# Patient Record
Sex: Male | Born: 1940 | Race: Black or African American | Hispanic: No | Marital: Married | Smoking: Never smoker
Health system: Southern US, Community
[De-identification: ages and names within clinical notes are randomized; demographics above are authoritative.]

## PROBLEM LIST (undated history)

## (undated) DIAGNOSIS — I5032 Chronic diastolic (congestive) heart failure: Secondary | ICD-10-CM

## (undated) DIAGNOSIS — N400 Enlarged prostate without lower urinary tract symptoms: Secondary | ICD-10-CM

## (undated) DIAGNOSIS — I10 Essential (primary) hypertension: Secondary | ICD-10-CM

## (undated) DIAGNOSIS — K648 Other hemorrhoids: Secondary | ICD-10-CM

## (undated) DIAGNOSIS — E119 Type 2 diabetes mellitus without complications: Secondary | ICD-10-CM

## (undated) DIAGNOSIS — E785 Hyperlipidemia, unspecified: Secondary | ICD-10-CM

## (undated) DIAGNOSIS — N182 Chronic kidney disease, stage 2 (mild): Secondary | ICD-10-CM

## (undated) DIAGNOSIS — K219 Gastro-esophageal reflux disease without esophagitis: Secondary | ICD-10-CM

## (undated) DIAGNOSIS — Z951 Presence of aortocoronary bypass graft: Secondary | ICD-10-CM

## (undated) DIAGNOSIS — G459 Transient cerebral ischemic attack, unspecified: Secondary | ICD-10-CM

## (undated) DIAGNOSIS — D126 Benign neoplasm of colon, unspecified: Secondary | ICD-10-CM

## (undated) DIAGNOSIS — G4730 Sleep apnea, unspecified: Secondary | ICD-10-CM

## (undated) DIAGNOSIS — G250 Essential tremor: Secondary | ICD-10-CM

## (undated) DIAGNOSIS — I2510 Atherosclerotic heart disease of native coronary artery without angina pectoris: Secondary | ICD-10-CM

## (undated) HISTORY — PX: LUMBAR DISC SURGERY: SHX700

## (undated) HISTORY — DX: Gastro-esophageal reflux disease without esophagitis: K21.9

## (undated) HISTORY — DX: Other hemorrhoids: K64.8

## (undated) HISTORY — PX: TOTAL KNEE ARTHROPLASTY: SHX125

## (undated) HISTORY — PX: CATARACT EXTRACTION, BILATERAL: SHX1313

## (undated) HISTORY — PX: CATARACT EXTRACTION: SUR2

## (undated) HISTORY — DX: Benign prostatic hyperplasia without lower urinary tract symptoms: N40.0

## (undated) HISTORY — PX: BACK SURGERY: SHX140

## (undated) HISTORY — PX: JOINT REPLACEMENT: SHX530

## (undated) HISTORY — DX: Benign neoplasm of colon, unspecified: D12.6

---

## 2000-10-31 DIAGNOSIS — G459 Transient cerebral ischemic attack, unspecified: Secondary | ICD-10-CM

## 2000-10-31 HISTORY — DX: Transient cerebral ischemic attack, unspecified: G45.9

## 2011-04-01 DIAGNOSIS — D126 Benign neoplasm of colon, unspecified: Secondary | ICD-10-CM

## 2011-04-01 HISTORY — DX: Benign neoplasm of colon, unspecified: D12.6

## 2011-12-02 DIAGNOSIS — Z8673 Personal history of transient ischemic attack (TIA), and cerebral infarction without residual deficits: Secondary | ICD-10-CM | POA: Diagnosis not present

## 2011-12-02 DIAGNOSIS — R0602 Shortness of breath: Secondary | ICD-10-CM | POA: Diagnosis not present

## 2011-12-02 DIAGNOSIS — Z794 Long term (current) use of insulin: Secondary | ICD-10-CM | POA: Diagnosis not present

## 2011-12-02 DIAGNOSIS — I252 Old myocardial infarction: Secondary | ICD-10-CM | POA: Diagnosis not present

## 2011-12-02 DIAGNOSIS — Z7982 Long term (current) use of aspirin: Secondary | ICD-10-CM | POA: Diagnosis not present

## 2011-12-02 DIAGNOSIS — J09X1 Influenza due to identified novel influenza A virus with pneumonia: Secondary | ICD-10-CM | POA: Diagnosis not present

## 2011-12-02 DIAGNOSIS — I499 Cardiac arrhythmia, unspecified: Secondary | ICD-10-CM | POA: Diagnosis not present

## 2011-12-02 DIAGNOSIS — I10 Essential (primary) hypertension: Secondary | ICD-10-CM | POA: Diagnosis not present

## 2011-12-02 DIAGNOSIS — E860 Dehydration: Secondary | ICD-10-CM | POA: Diagnosis not present

## 2011-12-02 DIAGNOSIS — J189 Pneumonia, unspecified organism: Secondary | ICD-10-CM | POA: Diagnosis not present

## 2011-12-02 DIAGNOSIS — K219 Gastro-esophageal reflux disease without esophagitis: Secondary | ICD-10-CM | POA: Diagnosis not present

## 2011-12-02 DIAGNOSIS — I2510 Atherosclerotic heart disease of native coronary artery without angina pectoris: Secondary | ICD-10-CM | POA: Diagnosis not present

## 2011-12-02 DIAGNOSIS — I2129 ST elevation (STEMI) myocardial infarction involving other sites: Secondary | ICD-10-CM | POA: Diagnosis not present

## 2011-12-02 DIAGNOSIS — M6282 Rhabdomyolysis: Secondary | ICD-10-CM | POA: Diagnosis not present

## 2011-12-02 DIAGNOSIS — J111 Influenza due to unidentified influenza virus with other respiratory manifestations: Secondary | ICD-10-CM | POA: Diagnosis not present

## 2011-12-02 DIAGNOSIS — R079 Chest pain, unspecified: Secondary | ICD-10-CM | POA: Diagnosis not present

## 2011-12-02 DIAGNOSIS — Z6841 Body Mass Index (BMI) 40.0 and over, adult: Secondary | ICD-10-CM | POA: Diagnosis not present

## 2011-12-02 DIAGNOSIS — E669 Obesity, unspecified: Secondary | ICD-10-CM | POA: Diagnosis not present

## 2011-12-02 DIAGNOSIS — Z96659 Presence of unspecified artificial knee joint: Secondary | ICD-10-CM | POA: Diagnosis not present

## 2011-12-13 DIAGNOSIS — J189 Pneumonia, unspecified organism: Secondary | ICD-10-CM | POA: Diagnosis not present

## 2011-12-13 DIAGNOSIS — E785 Hyperlipidemia, unspecified: Secondary | ICD-10-CM | POA: Diagnosis not present

## 2011-12-13 DIAGNOSIS — E669 Obesity, unspecified: Secondary | ICD-10-CM | POA: Diagnosis not present

## 2011-12-13 DIAGNOSIS — E119 Type 2 diabetes mellitus without complications: Secondary | ICD-10-CM | POA: Diagnosis not present

## 2011-12-19 DIAGNOSIS — H35359 Cystoid macular degeneration, unspecified eye: Secondary | ICD-10-CM | POA: Diagnosis not present

## 2011-12-19 DIAGNOSIS — H35039 Hypertensive retinopathy, unspecified eye: Secondary | ICD-10-CM | POA: Diagnosis not present

## 2011-12-19 DIAGNOSIS — E11319 Type 2 diabetes mellitus with unspecified diabetic retinopathy without macular edema: Secondary | ICD-10-CM | POA: Diagnosis not present

## 2011-12-19 DIAGNOSIS — H3560 Retinal hemorrhage, unspecified eye: Secondary | ICD-10-CM | POA: Diagnosis not present

## 2011-12-20 DIAGNOSIS — R4701 Aphasia: Secondary | ICD-10-CM | POA: Diagnosis not present

## 2011-12-20 DIAGNOSIS — G5600 Carpal tunnel syndrome, unspecified upper limb: Secondary | ICD-10-CM | POA: Diagnosis not present

## 2011-12-21 DIAGNOSIS — R319 Hematuria, unspecified: Secondary | ICD-10-CM | POA: Diagnosis not present

## 2011-12-21 DIAGNOSIS — R350 Frequency of micturition: Secondary | ICD-10-CM | POA: Diagnosis not present

## 2011-12-21 DIAGNOSIS — E119 Type 2 diabetes mellitus without complications: Secondary | ICD-10-CM | POA: Diagnosis not present

## 2011-12-21 DIAGNOSIS — N400 Enlarged prostate without lower urinary tract symptoms: Secondary | ICD-10-CM | POA: Diagnosis not present

## 2011-12-21 DIAGNOSIS — N390 Urinary tract infection, site not specified: Secondary | ICD-10-CM | POA: Diagnosis not present

## 2012-02-07 DIAGNOSIS — N400 Enlarged prostate without lower urinary tract symptoms: Secondary | ICD-10-CM | POA: Diagnosis not present

## 2012-02-09 DIAGNOSIS — E785 Hyperlipidemia, unspecified: Secondary | ICD-10-CM | POA: Diagnosis not present

## 2012-02-09 DIAGNOSIS — I10 Essential (primary) hypertension: Secondary | ICD-10-CM | POA: Diagnosis not present

## 2012-02-09 DIAGNOSIS — E119 Type 2 diabetes mellitus without complications: Secondary | ICD-10-CM | POA: Diagnosis not present

## 2012-02-10 DIAGNOSIS — N400 Enlarged prostate without lower urinary tract symptoms: Secondary | ICD-10-CM | POA: Diagnosis not present

## 2012-02-10 DIAGNOSIS — R0602 Shortness of breath: Secondary | ICD-10-CM | POA: Diagnosis not present

## 2012-02-10 DIAGNOSIS — R079 Chest pain, unspecified: Secondary | ICD-10-CM | POA: Diagnosis not present

## 2012-02-10 DIAGNOSIS — E119 Type 2 diabetes mellitus without complications: Secondary | ICD-10-CM | POA: Diagnosis not present

## 2012-02-10 DIAGNOSIS — I2510 Atherosclerotic heart disease of native coronary artery without angina pectoris: Secondary | ICD-10-CM | POA: Diagnosis not present

## 2012-02-10 DIAGNOSIS — E7800 Pure hypercholesterolemia, unspecified: Secondary | ICD-10-CM | POA: Diagnosis not present

## 2012-02-14 DIAGNOSIS — R51 Headache: Secondary | ICD-10-CM | POA: Diagnosis not present

## 2012-02-14 DIAGNOSIS — I6330 Cerebral infarction due to thrombosis of unspecified cerebral artery: Secondary | ICD-10-CM | POA: Diagnosis not present

## 2012-02-14 DIAGNOSIS — I63239 Cerebral infarction due to unspecified occlusion or stenosis of unspecified carotid arteries: Secondary | ICD-10-CM | POA: Diagnosis not present

## 2012-02-16 DIAGNOSIS — I6330 Cerebral infarction due to thrombosis of unspecified cerebral artery: Secondary | ICD-10-CM | POA: Diagnosis not present

## 2012-02-16 DIAGNOSIS — I63239 Cerebral infarction due to unspecified occlusion or stenosis of unspecified carotid arteries: Secondary | ICD-10-CM | POA: Diagnosis not present

## 2012-02-16 DIAGNOSIS — R51 Headache: Secondary | ICD-10-CM | POA: Diagnosis not present

## 2012-02-24 DIAGNOSIS — G459 Transient cerebral ischemic attack, unspecified: Secondary | ICD-10-CM | POA: Diagnosis not present

## 2012-02-24 DIAGNOSIS — K219 Gastro-esophageal reflux disease without esophagitis: Secondary | ICD-10-CM | POA: Diagnosis not present

## 2012-02-24 DIAGNOSIS — R350 Frequency of micturition: Secondary | ICD-10-CM | POA: Diagnosis not present

## 2012-02-24 DIAGNOSIS — I440 Atrioventricular block, first degree: Secondary | ICD-10-CM | POA: Diagnosis not present

## 2012-02-24 DIAGNOSIS — G4730 Sleep apnea, unspecified: Secondary | ICD-10-CM | POA: Diagnosis not present

## 2012-02-24 DIAGNOSIS — N411 Chronic prostatitis: Secondary | ICD-10-CM | POA: Diagnosis not present

## 2012-02-24 DIAGNOSIS — Z794 Long term (current) use of insulin: Secondary | ICD-10-CM | POA: Diagnosis not present

## 2012-02-24 DIAGNOSIS — R319 Hematuria, unspecified: Secondary | ICD-10-CM | POA: Diagnosis not present

## 2012-02-24 DIAGNOSIS — Z8673 Personal history of transient ischemic attack (TIA), and cerebral infarction without residual deficits: Secondary | ICD-10-CM | POA: Diagnosis not present

## 2012-02-24 DIAGNOSIS — I471 Supraventricular tachycardia: Secondary | ICD-10-CM | POA: Diagnosis not present

## 2012-02-24 DIAGNOSIS — N400 Enlarged prostate without lower urinary tract symptoms: Secondary | ICD-10-CM | POA: Diagnosis not present

## 2012-02-24 DIAGNOSIS — E119 Type 2 diabetes mellitus without complications: Secondary | ICD-10-CM | POA: Diagnosis not present

## 2012-02-24 DIAGNOSIS — Z6841 Body Mass Index (BMI) 40.0 and over, adult: Secondary | ICD-10-CM | POA: Diagnosis not present

## 2012-02-24 DIAGNOSIS — I2510 Atherosclerotic heart disease of native coronary artery without angina pectoris: Secondary | ICD-10-CM | POA: Diagnosis not present

## 2012-02-24 DIAGNOSIS — E7800 Pure hypercholesterolemia, unspecified: Secondary | ICD-10-CM | POA: Diagnosis not present

## 2012-02-24 DIAGNOSIS — I10 Essential (primary) hypertension: Secondary | ICD-10-CM | POA: Diagnosis not present

## 2012-02-24 DIAGNOSIS — I252 Old myocardial infarction: Secondary | ICD-10-CM | POA: Diagnosis not present

## 2012-02-24 DIAGNOSIS — Z96659 Presence of unspecified artificial knee joint: Secondary | ICD-10-CM | POA: Diagnosis not present

## 2012-02-24 DIAGNOSIS — R079 Chest pain, unspecified: Secondary | ICD-10-CM | POA: Diagnosis not present

## 2012-03-14 DIAGNOSIS — L738 Other specified follicular disorders: Secondary | ICD-10-CM | POA: Diagnosis not present

## 2012-03-14 DIAGNOSIS — E1159 Type 2 diabetes mellitus with other circulatory complications: Secondary | ICD-10-CM | POA: Diagnosis not present

## 2012-03-14 DIAGNOSIS — L84 Corns and callosities: Secondary | ICD-10-CM | POA: Diagnosis not present

## 2012-03-14 DIAGNOSIS — B351 Tinea unguium: Secondary | ICD-10-CM | POA: Diagnosis not present

## 2012-04-06 DIAGNOSIS — E785 Hyperlipidemia, unspecified: Secondary | ICD-10-CM | POA: Diagnosis not present

## 2012-05-08 DIAGNOSIS — Z6841 Body Mass Index (BMI) 40.0 and over, adult: Secondary | ICD-10-CM | POA: Diagnosis not present

## 2012-05-08 DIAGNOSIS — Z79899 Other long term (current) drug therapy: Secondary | ICD-10-CM | POA: Diagnosis not present

## 2012-05-08 DIAGNOSIS — Z7982 Long term (current) use of aspirin: Secondary | ICD-10-CM | POA: Diagnosis not present

## 2012-05-08 DIAGNOSIS — R079 Chest pain, unspecified: Secondary | ICD-10-CM | POA: Diagnosis not present

## 2012-05-08 DIAGNOSIS — I10 Essential (primary) hypertension: Secondary | ICD-10-CM | POA: Diagnosis not present

## 2012-05-08 DIAGNOSIS — E119 Type 2 diabetes mellitus without complications: Secondary | ICD-10-CM | POA: Diagnosis not present

## 2012-05-08 DIAGNOSIS — E7800 Pure hypercholesterolemia, unspecified: Secondary | ICD-10-CM | POA: Diagnosis not present

## 2012-05-08 DIAGNOSIS — E669 Obesity, unspecified: Secondary | ICD-10-CM | POA: Diagnosis not present

## 2012-05-08 DIAGNOSIS — R0602 Shortness of breath: Secondary | ICD-10-CM | POA: Diagnosis not present

## 2012-05-08 DIAGNOSIS — I252 Old myocardial infarction: Secondary | ICD-10-CM | POA: Diagnosis not present

## 2012-05-08 DIAGNOSIS — I2510 Atherosclerotic heart disease of native coronary artery without angina pectoris: Secondary | ICD-10-CM | POA: Diagnosis not present

## 2012-05-08 DIAGNOSIS — R0789 Other chest pain: Secondary | ICD-10-CM | POA: Diagnosis not present

## 2012-05-08 DIAGNOSIS — I440 Atrioventricular block, first degree: Secondary | ICD-10-CM | POA: Diagnosis not present

## 2012-05-08 DIAGNOSIS — Z794 Long term (current) use of insulin: Secondary | ICD-10-CM | POA: Diagnosis not present

## 2012-05-08 DIAGNOSIS — I259 Chronic ischemic heart disease, unspecified: Secondary | ICD-10-CM | POA: Diagnosis not present

## 2012-05-08 DIAGNOSIS — F411 Generalized anxiety disorder: Secondary | ICD-10-CM | POA: Diagnosis not present

## 2012-05-08 DIAGNOSIS — E789 Disorder of lipoprotein metabolism, unspecified: Secondary | ICD-10-CM | POA: Diagnosis not present

## 2012-05-09 DIAGNOSIS — E789 Disorder of lipoprotein metabolism, unspecified: Secondary | ICD-10-CM | POA: Diagnosis not present

## 2012-05-09 DIAGNOSIS — I10 Essential (primary) hypertension: Secondary | ICD-10-CM | POA: Diagnosis not present

## 2012-05-09 DIAGNOSIS — I259 Chronic ischemic heart disease, unspecified: Secondary | ICD-10-CM | POA: Diagnosis not present

## 2012-05-09 DIAGNOSIS — R079 Chest pain, unspecified: Secondary | ICD-10-CM | POA: Diagnosis not present

## 2012-05-10 DIAGNOSIS — R079 Chest pain, unspecified: Secondary | ICD-10-CM | POA: Diagnosis not present

## 2012-05-10 DIAGNOSIS — I2510 Atherosclerotic heart disease of native coronary artery without angina pectoris: Secondary | ICD-10-CM | POA: Diagnosis not present

## 2012-05-16 DIAGNOSIS — G4733 Obstructive sleep apnea (adult) (pediatric): Secondary | ICD-10-CM | POA: Diagnosis not present

## 2012-05-16 DIAGNOSIS — Z7982 Long term (current) use of aspirin: Secondary | ICD-10-CM | POA: Diagnosis not present

## 2012-05-16 DIAGNOSIS — G609 Hereditary and idiopathic neuropathy, unspecified: Secondary | ICD-10-CM | POA: Diagnosis not present

## 2012-05-16 DIAGNOSIS — Z8249 Family history of ischemic heart disease and other diseases of the circulatory system: Secondary | ICD-10-CM | POA: Diagnosis not present

## 2012-05-16 DIAGNOSIS — M1990 Unspecified osteoarthritis, unspecified site: Secondary | ICD-10-CM | POA: Diagnosis not present

## 2012-05-16 DIAGNOSIS — E785 Hyperlipidemia, unspecified: Secondary | ICD-10-CM | POA: Diagnosis not present

## 2012-05-16 DIAGNOSIS — I501 Left ventricular failure: Secondary | ICD-10-CM | POA: Diagnosis not present

## 2012-05-16 DIAGNOSIS — I10 Essential (primary) hypertension: Secondary | ICD-10-CM | POA: Diagnosis not present

## 2012-05-16 DIAGNOSIS — K219 Gastro-esophageal reflux disease without esophagitis: Secondary | ICD-10-CM | POA: Diagnosis not present

## 2012-05-16 DIAGNOSIS — N400 Enlarged prostate without lower urinary tract symptoms: Secondary | ICD-10-CM | POA: Diagnosis not present

## 2012-05-16 DIAGNOSIS — R0789 Other chest pain: Secondary | ICD-10-CM | POA: Diagnosis not present

## 2012-05-16 DIAGNOSIS — I2510 Atherosclerotic heart disease of native coronary artery without angina pectoris: Secondary | ICD-10-CM | POA: Diagnosis not present

## 2012-07-05 DIAGNOSIS — IMO0002 Reserved for concepts with insufficient information to code with codable children: Secondary | ICD-10-CM | POA: Diagnosis not present

## 2012-07-05 DIAGNOSIS — I10 Essential (primary) hypertension: Secondary | ICD-10-CM | POA: Diagnosis not present

## 2012-07-05 DIAGNOSIS — M549 Dorsalgia, unspecified: Secondary | ICD-10-CM | POA: Diagnosis not present

## 2012-07-05 DIAGNOSIS — R079 Chest pain, unspecified: Secondary | ICD-10-CM | POA: Diagnosis not present

## 2012-07-05 DIAGNOSIS — M25519 Pain in unspecified shoulder: Secondary | ICD-10-CM | POA: Diagnosis not present

## 2012-07-09 DIAGNOSIS — E11319 Type 2 diabetes mellitus with unspecified diabetic retinopathy without macular edema: Secondary | ICD-10-CM | POA: Diagnosis not present

## 2012-07-09 DIAGNOSIS — H02409 Unspecified ptosis of unspecified eyelid: Secondary | ICD-10-CM | POA: Diagnosis not present

## 2012-07-09 DIAGNOSIS — H40019 Open angle with borderline findings, low risk, unspecified eye: Secondary | ICD-10-CM | POA: Diagnosis not present

## 2012-07-09 DIAGNOSIS — H01009 Unspecified blepharitis unspecified eye, unspecified eyelid: Secondary | ICD-10-CM | POA: Diagnosis not present

## 2012-07-31 DIAGNOSIS — E119 Type 2 diabetes mellitus without complications: Secondary | ICD-10-CM | POA: Diagnosis not present

## 2012-07-31 DIAGNOSIS — E785 Hyperlipidemia, unspecified: Secondary | ICD-10-CM | POA: Diagnosis not present

## 2012-07-31 DIAGNOSIS — I10 Essential (primary) hypertension: Secondary | ICD-10-CM | POA: Diagnosis not present

## 2012-07-31 DIAGNOSIS — E669 Obesity, unspecified: Secondary | ICD-10-CM | POA: Diagnosis not present

## 2012-08-04 DIAGNOSIS — E559 Vitamin D deficiency, unspecified: Secondary | ICD-10-CM | POA: Diagnosis not present

## 2012-08-04 DIAGNOSIS — E0500 Thyrotoxicosis with diffuse goiter without thyrotoxic crisis or storm: Secondary | ICD-10-CM | POA: Diagnosis not present

## 2012-08-04 DIAGNOSIS — E236 Other disorders of pituitary gland: Secondary | ICD-10-CM | POA: Diagnosis not present

## 2012-08-04 DIAGNOSIS — Z79899 Other long term (current) drug therapy: Secondary | ICD-10-CM | POA: Diagnosis not present

## 2012-08-18 DIAGNOSIS — E8881 Metabolic syndrome: Secondary | ICD-10-CM | POA: Diagnosis present

## 2012-08-18 DIAGNOSIS — I440 Atrioventricular block, first degree: Secondary | ICD-10-CM | POA: Diagnosis not present

## 2012-08-18 DIAGNOSIS — M6281 Muscle weakness (generalized): Secondary | ICD-10-CM | POA: Diagnosis not present

## 2012-08-18 DIAGNOSIS — I10 Essential (primary) hypertension: Secondary | ICD-10-CM | POA: Diagnosis not present

## 2012-08-18 DIAGNOSIS — I517 Cardiomegaly: Secondary | ICD-10-CM | POA: Diagnosis not present

## 2012-08-18 DIAGNOSIS — I369 Nonrheumatic tricuspid valve disorder, unspecified: Secondary | ICD-10-CM | POA: Diagnosis not present

## 2012-08-18 DIAGNOSIS — I672 Cerebral atherosclerosis: Secondary | ICD-10-CM | POA: Diagnosis not present

## 2012-08-18 DIAGNOSIS — M542 Cervicalgia: Secondary | ICD-10-CM | POA: Diagnosis not present

## 2012-08-18 DIAGNOSIS — E7800 Pure hypercholesterolemia, unspecified: Secondary | ICD-10-CM | POA: Diagnosis present

## 2012-08-18 DIAGNOSIS — N390 Urinary tract infection, site not specified: Secondary | ICD-10-CM | POA: Diagnosis not present

## 2012-08-18 DIAGNOSIS — E669 Obesity, unspecified: Secondary | ICD-10-CM | POA: Diagnosis present

## 2012-08-18 DIAGNOSIS — R51 Headache: Secondary | ICD-10-CM | POA: Diagnosis not present

## 2012-08-18 DIAGNOSIS — J45909 Unspecified asthma, uncomplicated: Secondary | ICD-10-CM | POA: Diagnosis not present

## 2012-08-18 DIAGNOSIS — M129 Arthropathy, unspecified: Secondary | ICD-10-CM | POA: Diagnosis present

## 2012-08-18 DIAGNOSIS — G4733 Obstructive sleep apnea (adult) (pediatric): Secondary | ICD-10-CM | POA: Diagnosis present

## 2012-08-18 DIAGNOSIS — I669 Occlusion and stenosis of unspecified cerebral artery: Secondary | ICD-10-CM | POA: Diagnosis not present

## 2012-08-18 DIAGNOSIS — F411 Generalized anxiety disorder: Secondary | ICD-10-CM | POA: Diagnosis present

## 2012-08-18 DIAGNOSIS — I6529 Occlusion and stenosis of unspecified carotid artery: Secondary | ICD-10-CM | POA: Diagnosis not present

## 2012-08-18 DIAGNOSIS — G459 Transient cerebral ischemic attack, unspecified: Secondary | ICD-10-CM | POA: Diagnosis not present

## 2012-08-18 DIAGNOSIS — I6789 Other cerebrovascular disease: Secondary | ICD-10-CM | POA: Diagnosis not present

## 2012-08-18 DIAGNOSIS — E119 Type 2 diabetes mellitus without complications: Secondary | ICD-10-CM | POA: Diagnosis not present

## 2012-08-18 DIAGNOSIS — Z8673 Personal history of transient ischemic attack (TIA), and cerebral infarction without residual deficits: Secondary | ICD-10-CM | POA: Diagnosis not present

## 2012-08-18 DIAGNOSIS — I772 Rupture of artery: Secondary | ICD-10-CM | POA: Diagnosis not present

## 2012-08-18 DIAGNOSIS — N200 Calculus of kidney: Secondary | ICD-10-CM | POA: Diagnosis present

## 2012-08-18 DIAGNOSIS — I252 Old myocardial infarction: Secondary | ICD-10-CM | POA: Diagnosis not present

## 2012-08-18 DIAGNOSIS — R319 Hematuria, unspecified: Secondary | ICD-10-CM | POA: Diagnosis not present

## 2012-08-18 DIAGNOSIS — Z6841 Body Mass Index (BMI) 40.0 and over, adult: Secondary | ICD-10-CM | POA: Diagnosis not present

## 2012-08-18 DIAGNOSIS — I6350 Cerebral infarction due to unspecified occlusion or stenosis of unspecified cerebral artery: Secondary | ICD-10-CM | POA: Diagnosis not present

## 2012-08-18 DIAGNOSIS — Z87891 Personal history of nicotine dependence: Secondary | ICD-10-CM | POA: Diagnosis not present

## 2012-08-18 DIAGNOSIS — R079 Chest pain, unspecified: Secondary | ICD-10-CM | POA: Diagnosis not present

## 2012-08-22 DIAGNOSIS — R51 Headache: Secondary | ICD-10-CM | POA: Diagnosis present

## 2012-08-22 DIAGNOSIS — N390 Urinary tract infection, site not specified: Secondary | ICD-10-CM | POA: Diagnosis present

## 2012-08-22 DIAGNOSIS — K219 Gastro-esophageal reflux disease without esophagitis: Secondary | ICD-10-CM | POA: Diagnosis present

## 2012-08-22 DIAGNOSIS — Z794 Long term (current) use of insulin: Secondary | ICD-10-CM | POA: Diagnosis not present

## 2012-08-22 DIAGNOSIS — G8100 Flaccid hemiplegia affecting unspecified side: Secondary | ICD-10-CM | POA: Diagnosis present

## 2012-08-22 DIAGNOSIS — IMO0001 Reserved for inherently not codable concepts without codable children: Secondary | ICD-10-CM | POA: Diagnosis not present

## 2012-08-22 DIAGNOSIS — J45909 Unspecified asthma, uncomplicated: Secondary | ICD-10-CM | POA: Diagnosis not present

## 2012-08-22 DIAGNOSIS — F411 Generalized anxiety disorder: Secondary | ICD-10-CM | POA: Diagnosis present

## 2012-08-22 DIAGNOSIS — G4733 Obstructive sleep apnea (adult) (pediatric): Secondary | ICD-10-CM | POA: Diagnosis present

## 2012-08-22 DIAGNOSIS — Z8673 Personal history of transient ischemic attack (TIA), and cerebral infarction without residual deficits: Secondary | ICD-10-CM | POA: Diagnosis not present

## 2012-08-22 DIAGNOSIS — R112 Nausea with vomiting, unspecified: Secondary | ICD-10-CM | POA: Diagnosis present

## 2012-08-22 DIAGNOSIS — E669 Obesity, unspecified: Secondary | ICD-10-CM | POA: Diagnosis present

## 2012-08-22 DIAGNOSIS — R269 Unspecified abnormalities of gait and mobility: Secondary | ICD-10-CM | POA: Diagnosis present

## 2012-08-22 DIAGNOSIS — I10 Essential (primary) hypertension: Secondary | ICD-10-CM | POA: Diagnosis not present

## 2012-08-22 DIAGNOSIS — G4700 Insomnia, unspecified: Secondary | ICD-10-CM | POA: Diagnosis present

## 2012-08-22 DIAGNOSIS — R5381 Other malaise: Secondary | ICD-10-CM | POA: Diagnosis present

## 2012-08-22 DIAGNOSIS — I2510 Atherosclerotic heart disease of native coronary artery without angina pectoris: Secondary | ICD-10-CM | POA: Diagnosis present

## 2012-08-22 DIAGNOSIS — I440 Atrioventricular block, first degree: Secondary | ICD-10-CM | POA: Diagnosis not present

## 2012-08-22 DIAGNOSIS — G459 Transient cerebral ischemic attack, unspecified: Secondary | ICD-10-CM | POA: Diagnosis not present

## 2012-08-22 DIAGNOSIS — M6281 Muscle weakness (generalized): Secondary | ICD-10-CM | POA: Diagnosis not present

## 2012-08-22 DIAGNOSIS — I6789 Other cerebrovascular disease: Secondary | ICD-10-CM | POA: Diagnosis not present

## 2012-09-03 DIAGNOSIS — R4701 Aphasia: Secondary | ICD-10-CM | POA: Diagnosis not present

## 2012-09-06 DIAGNOSIS — G459 Transient cerebral ischemic attack, unspecified: Secondary | ICD-10-CM | POA: Diagnosis not present

## 2012-09-06 DIAGNOSIS — R4701 Aphasia: Secondary | ICD-10-CM | POA: Diagnosis not present

## 2012-09-24 DIAGNOSIS — I872 Venous insufficiency (chronic) (peripheral): Secondary | ICD-10-CM | POA: Diagnosis not present

## 2012-09-24 DIAGNOSIS — L851 Acquired keratosis [keratoderma] palmaris et plantaris: Secondary | ICD-10-CM | POA: Diagnosis not present

## 2012-09-24 DIAGNOSIS — E1149 Type 2 diabetes mellitus with other diabetic neurological complication: Secondary | ICD-10-CM | POA: Diagnosis not present

## 2012-09-24 DIAGNOSIS — E1142 Type 2 diabetes mellitus with diabetic polyneuropathy: Secondary | ICD-10-CM | POA: Diagnosis not present

## 2012-09-24 DIAGNOSIS — R609 Edema, unspecified: Secondary | ICD-10-CM | POA: Diagnosis not present

## 2012-09-24 DIAGNOSIS — B351 Tinea unguium: Secondary | ICD-10-CM | POA: Diagnosis not present

## 2012-10-04 DIAGNOSIS — J45909 Unspecified asthma, uncomplicated: Secondary | ICD-10-CM | POA: Diagnosis not present

## 2012-10-04 DIAGNOSIS — I69998 Other sequelae following unspecified cerebrovascular disease: Secondary | ICD-10-CM | POA: Diagnosis not present

## 2012-10-04 DIAGNOSIS — I6350 Cerebral infarction due to unspecified occlusion or stenosis of unspecified cerebral artery: Secondary | ICD-10-CM | POA: Diagnosis not present

## 2012-10-04 DIAGNOSIS — I440 Atrioventricular block, first degree: Secondary | ICD-10-CM | POA: Diagnosis not present

## 2012-10-04 DIAGNOSIS — R072 Precordial pain: Secondary | ICD-10-CM | POA: Diagnosis not present

## 2012-10-04 DIAGNOSIS — K219 Gastro-esophageal reflux disease without esophagitis: Secondary | ICD-10-CM | POA: Diagnosis present

## 2012-10-04 DIAGNOSIS — I6789 Other cerebrovascular disease: Secondary | ICD-10-CM | POA: Diagnosis not present

## 2012-10-04 DIAGNOSIS — M129 Arthropathy, unspecified: Secondary | ICD-10-CM | POA: Diagnosis present

## 2012-10-04 DIAGNOSIS — I252 Old myocardial infarction: Secondary | ICD-10-CM | POA: Diagnosis not present

## 2012-10-04 DIAGNOSIS — E119 Type 2 diabetes mellitus without complications: Secondary | ICD-10-CM | POA: Diagnosis not present

## 2012-10-04 DIAGNOSIS — I2510 Atherosclerotic heart disease of native coronary artery without angina pectoris: Secondary | ICD-10-CM | POA: Diagnosis not present

## 2012-10-04 DIAGNOSIS — M6281 Muscle weakness (generalized): Secondary | ICD-10-CM | POA: Diagnosis present

## 2012-10-04 DIAGNOSIS — R209 Unspecified disturbances of skin sensation: Secondary | ICD-10-CM | POA: Diagnosis present

## 2012-10-04 DIAGNOSIS — I10 Essential (primary) hypertension: Secondary | ICD-10-CM | POA: Diagnosis not present

## 2012-10-04 DIAGNOSIS — I491 Atrial premature depolarization: Secondary | ICD-10-CM | POA: Diagnosis not present

## 2012-10-04 DIAGNOSIS — R42 Dizziness and giddiness: Secondary | ICD-10-CM | POA: Diagnosis not present

## 2012-10-04 DIAGNOSIS — E785 Hyperlipidemia, unspecified: Secondary | ICD-10-CM | POA: Diagnosis present

## 2012-10-04 DIAGNOSIS — D638 Anemia in other chronic diseases classified elsewhere: Secondary | ICD-10-CM | POA: Diagnosis present

## 2012-10-04 DIAGNOSIS — F411 Generalized anxiety disorder: Secondary | ICD-10-CM | POA: Diagnosis present

## 2012-10-11 DIAGNOSIS — R079 Chest pain, unspecified: Secondary | ICD-10-CM | POA: Diagnosis not present

## 2012-10-11 DIAGNOSIS — I2510 Atherosclerotic heart disease of native coronary artery without angina pectoris: Secondary | ICD-10-CM | POA: Diagnosis not present

## 2012-12-18 DIAGNOSIS — R079 Chest pain, unspecified: Secondary | ICD-10-CM | POA: Diagnosis not present

## 2012-12-18 DIAGNOSIS — I2510 Atherosclerotic heart disease of native coronary artery without angina pectoris: Secondary | ICD-10-CM | POA: Diagnosis not present

## 2013-02-04 DIAGNOSIS — R279 Unspecified lack of coordination: Secondary | ICD-10-CM | POA: Diagnosis not present

## 2013-02-04 DIAGNOSIS — G5600 Carpal tunnel syndrome, unspecified upper limb: Secondary | ICD-10-CM | POA: Diagnosis not present

## 2013-02-04 DIAGNOSIS — R413 Other amnesia: Secondary | ICD-10-CM | POA: Diagnosis not present

## 2013-02-04 DIAGNOSIS — G459 Transient cerebral ischemic attack, unspecified: Secondary | ICD-10-CM | POA: Diagnosis not present

## 2013-02-24 DIAGNOSIS — Z87442 Personal history of urinary calculi: Secondary | ICD-10-CM | POA: Diagnosis not present

## 2013-02-24 DIAGNOSIS — R079 Chest pain, unspecified: Secondary | ICD-10-CM | POA: Diagnosis not present

## 2013-02-24 DIAGNOSIS — I2510 Atherosclerotic heart disease of native coronary artery without angina pectoris: Secondary | ICD-10-CM | POA: Diagnosis not present

## 2013-02-24 DIAGNOSIS — Z7902 Long term (current) use of antithrombotics/antiplatelets: Secondary | ICD-10-CM | POA: Diagnosis not present

## 2013-02-24 DIAGNOSIS — N390 Urinary tract infection, site not specified: Secondary | ICD-10-CM | POA: Diagnosis not present

## 2013-02-24 DIAGNOSIS — J45909 Unspecified asthma, uncomplicated: Secondary | ICD-10-CM | POA: Diagnosis not present

## 2013-02-24 DIAGNOSIS — I10 Essential (primary) hypertension: Secondary | ICD-10-CM | POA: Diagnosis not present

## 2013-02-24 DIAGNOSIS — M129 Arthropathy, unspecified: Secondary | ICD-10-CM | POA: Diagnosis not present

## 2013-02-24 DIAGNOSIS — E7800 Pure hypercholesterolemia, unspecified: Secondary | ICD-10-CM | POA: Diagnosis not present

## 2013-02-24 DIAGNOSIS — R072 Precordial pain: Secondary | ICD-10-CM | POA: Diagnosis not present

## 2013-02-24 DIAGNOSIS — I252 Old myocardial infarction: Secondary | ICD-10-CM | POA: Diagnosis not present

## 2013-02-24 DIAGNOSIS — E119 Type 2 diabetes mellitus without complications: Secondary | ICD-10-CM | POA: Diagnosis not present

## 2013-02-24 DIAGNOSIS — Z8673 Personal history of transient ischemic attack (TIA), and cerebral infarction without residual deficits: Secondary | ICD-10-CM | POA: Diagnosis not present

## 2013-02-25 DIAGNOSIS — I10 Essential (primary) hypertension: Secondary | ICD-10-CM | POA: Diagnosis not present

## 2013-02-25 DIAGNOSIS — E119 Type 2 diabetes mellitus without complications: Secondary | ICD-10-CM | POA: Diagnosis not present

## 2013-02-25 DIAGNOSIS — R079 Chest pain, unspecified: Secondary | ICD-10-CM | POA: Diagnosis not present

## 2013-02-25 DIAGNOSIS — I498 Other specified cardiac arrhythmias: Secondary | ICD-10-CM | POA: Diagnosis not present

## 2013-02-26 DIAGNOSIS — E119 Type 2 diabetes mellitus without complications: Secondary | ICD-10-CM | POA: Diagnosis not present

## 2013-02-26 DIAGNOSIS — I498 Other specified cardiac arrhythmias: Secondary | ICD-10-CM | POA: Diagnosis not present

## 2013-02-26 DIAGNOSIS — I10 Essential (primary) hypertension: Secondary | ICD-10-CM | POA: Diagnosis not present

## 2013-02-26 DIAGNOSIS — R079 Chest pain, unspecified: Secondary | ICD-10-CM | POA: Diagnosis not present

## 2013-03-20 DIAGNOSIS — N401 Enlarged prostate with lower urinary tract symptoms: Secondary | ICD-10-CM | POA: Diagnosis not present

## 2013-03-20 DIAGNOSIS — R3129 Other microscopic hematuria: Secondary | ICD-10-CM | POA: Diagnosis not present

## 2013-03-20 DIAGNOSIS — R350 Frequency of micturition: Secondary | ICD-10-CM | POA: Diagnosis not present

## 2013-03-20 DIAGNOSIS — E119 Type 2 diabetes mellitus without complications: Secondary | ICD-10-CM | POA: Diagnosis not present

## 2013-03-20 DIAGNOSIS — I10 Essential (primary) hypertension: Secondary | ICD-10-CM | POA: Diagnosis not present

## 2013-03-22 DIAGNOSIS — N401 Enlarged prostate with lower urinary tract symptoms: Secondary | ICD-10-CM | POA: Diagnosis not present

## 2013-04-29 DIAGNOSIS — E1149 Type 2 diabetes mellitus with other diabetic neurological complication: Secondary | ICD-10-CM | POA: Diagnosis not present

## 2013-04-29 DIAGNOSIS — B351 Tinea unguium: Secondary | ICD-10-CM | POA: Diagnosis not present

## 2013-05-07 DIAGNOSIS — IMO0002 Reserved for concepts with insufficient information to code with codable children: Secondary | ICD-10-CM | POA: Diagnosis not present

## 2013-05-07 DIAGNOSIS — X58XXXA Exposure to other specified factors, initial encounter: Secondary | ICD-10-CM | POA: Diagnosis not present

## 2013-05-07 DIAGNOSIS — E119 Type 2 diabetes mellitus without complications: Secondary | ICD-10-CM | POA: Diagnosis not present

## 2013-05-07 DIAGNOSIS — Y929 Unspecified place or not applicable: Secondary | ICD-10-CM | POA: Diagnosis not present

## 2013-05-07 DIAGNOSIS — Z7902 Long term (current) use of antithrombotics/antiplatelets: Secondary | ICD-10-CM | POA: Diagnosis not present

## 2013-05-07 DIAGNOSIS — I498 Other specified cardiac arrhythmias: Secondary | ICD-10-CM | POA: Diagnosis not present

## 2013-05-07 DIAGNOSIS — I10 Essential (primary) hypertension: Secondary | ICD-10-CM | POA: Diagnosis not present

## 2013-05-07 DIAGNOSIS — M5412 Radiculopathy, cervical region: Secondary | ICD-10-CM | POA: Diagnosis not present

## 2013-05-07 DIAGNOSIS — Z8673 Personal history of transient ischemic attack (TIA), and cerebral infarction without residual deficits: Secondary | ICD-10-CM | POA: Diagnosis not present

## 2013-05-07 DIAGNOSIS — R079 Chest pain, unspecified: Secondary | ICD-10-CM | POA: Diagnosis not present

## 2013-05-07 DIAGNOSIS — Z87442 Personal history of urinary calculi: Secondary | ICD-10-CM | POA: Diagnosis not present

## 2013-05-07 DIAGNOSIS — E7800 Pure hypercholesterolemia, unspecified: Secondary | ICD-10-CM | POA: Diagnosis not present

## 2013-05-07 DIAGNOSIS — I252 Old myocardial infarction: Secondary | ICD-10-CM | POA: Diagnosis not present

## 2013-05-07 DIAGNOSIS — I519 Heart disease, unspecified: Secondary | ICD-10-CM | POA: Diagnosis not present

## 2013-05-08 DIAGNOSIS — E119 Type 2 diabetes mellitus without complications: Secondary | ICD-10-CM | POA: Diagnosis not present

## 2013-05-08 DIAGNOSIS — I519 Heart disease, unspecified: Secondary | ICD-10-CM | POA: Diagnosis not present

## 2013-05-08 DIAGNOSIS — I10 Essential (primary) hypertension: Secondary | ICD-10-CM | POA: Diagnosis not present

## 2013-05-08 DIAGNOSIS — I498 Other specified cardiac arrhythmias: Secondary | ICD-10-CM | POA: Diagnosis not present

## 2013-06-06 DIAGNOSIS — E669 Obesity, unspecified: Secondary | ICD-10-CM | POA: Diagnosis not present

## 2013-06-06 DIAGNOSIS — I10 Essential (primary) hypertension: Secondary | ICD-10-CM | POA: Diagnosis not present

## 2013-06-06 DIAGNOSIS — E119 Type 2 diabetes mellitus without complications: Secondary | ICD-10-CM | POA: Diagnosis not present

## 2013-06-06 DIAGNOSIS — M1990 Unspecified osteoarthritis, unspecified site: Secondary | ICD-10-CM | POA: Diagnosis not present

## 2013-06-07 DIAGNOSIS — I10 Essential (primary) hypertension: Secondary | ICD-10-CM | POA: Diagnosis not present

## 2013-06-07 DIAGNOSIS — E782 Mixed hyperlipidemia: Secondary | ICD-10-CM | POA: Diagnosis not present

## 2013-06-07 DIAGNOSIS — E119 Type 2 diabetes mellitus without complications: Secondary | ICD-10-CM | POA: Diagnosis not present

## 2013-06-11 DIAGNOSIS — E785 Hyperlipidemia, unspecified: Secondary | ICD-10-CM | POA: Diagnosis not present

## 2013-06-11 DIAGNOSIS — I10 Essential (primary) hypertension: Secondary | ICD-10-CM | POA: Diagnosis not present

## 2013-06-11 DIAGNOSIS — E1165 Type 2 diabetes mellitus with hyperglycemia: Secondary | ICD-10-CM | POA: Diagnosis not present

## 2013-06-17 DIAGNOSIS — I2510 Atherosclerotic heart disease of native coronary artery without angina pectoris: Secondary | ICD-10-CM | POA: Diagnosis not present

## 2013-06-17 DIAGNOSIS — R079 Chest pain, unspecified: Secondary | ICD-10-CM | POA: Diagnosis not present

## 2013-07-18 DIAGNOSIS — R413 Other amnesia: Secondary | ICD-10-CM | POA: Diagnosis not present

## 2013-07-18 DIAGNOSIS — I669 Occlusion and stenosis of unspecified cerebral artery: Secondary | ICD-10-CM | POA: Diagnosis not present

## 2013-07-18 DIAGNOSIS — R4701 Aphasia: Secondary | ICD-10-CM | POA: Diagnosis not present

## 2013-07-23 DIAGNOSIS — E785 Hyperlipidemia, unspecified: Secondary | ICD-10-CM | POA: Diagnosis not present

## 2013-07-23 DIAGNOSIS — I10 Essential (primary) hypertension: Secondary | ICD-10-CM | POA: Diagnosis not present

## 2013-07-23 DIAGNOSIS — G4733 Obstructive sleep apnea (adult) (pediatric): Secondary | ICD-10-CM | POA: Diagnosis not present

## 2013-07-31 DIAGNOSIS — E1149 Type 2 diabetes mellitus with other diabetic neurological complication: Secondary | ICD-10-CM | POA: Diagnosis not present

## 2013-07-31 DIAGNOSIS — L089 Local infection of the skin and subcutaneous tissue, unspecified: Secondary | ICD-10-CM | POA: Diagnosis not present

## 2013-07-31 DIAGNOSIS — B351 Tinea unguium: Secondary | ICD-10-CM | POA: Diagnosis not present

## 2013-08-07 DIAGNOSIS — E11319 Type 2 diabetes mellitus with unspecified diabetic retinopathy without macular edema: Secondary | ICD-10-CM | POA: Diagnosis not present

## 2013-08-07 DIAGNOSIS — H40019 Open angle with borderline findings, low risk, unspecified eye: Secondary | ICD-10-CM | POA: Diagnosis not present

## 2013-08-07 DIAGNOSIS — H538 Other visual disturbances: Secondary | ICD-10-CM | POA: Diagnosis not present

## 2013-08-07 DIAGNOSIS — H02409 Unspecified ptosis of unspecified eyelid: Secondary | ICD-10-CM | POA: Diagnosis not present

## 2013-08-14 DIAGNOSIS — R569 Unspecified convulsions: Secondary | ICD-10-CM | POA: Diagnosis not present

## 2013-08-14 DIAGNOSIS — I6529 Occlusion and stenosis of unspecified carotid artery: Secondary | ICD-10-CM | POA: Diagnosis not present

## 2013-08-14 DIAGNOSIS — H81399 Other peripheral vertigo, unspecified ear: Secondary | ICD-10-CM | POA: Diagnosis not present

## 2013-08-14 DIAGNOSIS — H9209 Otalgia, unspecified ear: Secondary | ICD-10-CM | POA: Diagnosis not present

## 2013-08-14 DIAGNOSIS — H6120 Impacted cerumen, unspecified ear: Secondary | ICD-10-CM | POA: Diagnosis not present

## 2013-08-14 DIAGNOSIS — R51 Headache: Secondary | ICD-10-CM | POA: Diagnosis not present

## 2013-08-15 DIAGNOSIS — M19019 Primary osteoarthritis, unspecified shoulder: Secondary | ICD-10-CM | POA: Diagnosis not present

## 2013-08-21 DIAGNOSIS — G459 Transient cerebral ischemic attack, unspecified: Secondary | ICD-10-CM | POA: Diagnosis not present

## 2013-08-28 DIAGNOSIS — H10509 Unspecified blepharoconjunctivitis, unspecified eye: Secondary | ICD-10-CM | POA: Diagnosis not present

## 2013-10-11 DIAGNOSIS — H40019 Open angle with borderline findings, low risk, unspecified eye: Secondary | ICD-10-CM | POA: Diagnosis not present

## 2013-10-28 DIAGNOSIS — R079 Chest pain, unspecified: Secondary | ICD-10-CM | POA: Diagnosis not present

## 2013-10-28 DIAGNOSIS — I491 Atrial premature depolarization: Secondary | ICD-10-CM | POA: Diagnosis not present

## 2013-10-28 DIAGNOSIS — I440 Atrioventricular block, first degree: Secondary | ICD-10-CM | POA: Diagnosis not present

## 2013-10-28 DIAGNOSIS — I10 Essential (primary) hypertension: Secondary | ICD-10-CM | POA: Diagnosis not present

## 2013-10-28 DIAGNOSIS — E119 Type 2 diabetes mellitus without complications: Secondary | ICD-10-CM | POA: Diagnosis not present

## 2013-10-28 DIAGNOSIS — Z7902 Long term (current) use of antithrombotics/antiplatelets: Secondary | ICD-10-CM | POA: Diagnosis not present

## 2013-10-28 DIAGNOSIS — I2510 Atherosclerotic heart disease of native coronary artery without angina pectoris: Secondary | ICD-10-CM | POA: Diagnosis not present

## 2013-10-28 DIAGNOSIS — Z8673 Personal history of transient ischemic attack (TIA), and cerebral infarction without residual deficits: Secondary | ICD-10-CM | POA: Diagnosis not present

## 2013-10-28 DIAGNOSIS — Z96659 Presence of unspecified artificial knee joint: Secondary | ICD-10-CM | POA: Diagnosis not present

## 2013-10-28 DIAGNOSIS — J189 Pneumonia, unspecified organism: Secondary | ICD-10-CM | POA: Diagnosis not present

## 2013-10-29 DIAGNOSIS — R079 Chest pain, unspecified: Secondary | ICD-10-CM | POA: Diagnosis not present

## 2013-10-29 DIAGNOSIS — J189 Pneumonia, unspecified organism: Secondary | ICD-10-CM | POA: Diagnosis not present

## 2013-10-29 DIAGNOSIS — E119 Type 2 diabetes mellitus without complications: Secondary | ICD-10-CM | POA: Diagnosis not present

## 2013-10-29 DIAGNOSIS — I4949 Other premature depolarization: Secondary | ICD-10-CM | POA: Diagnosis not present

## 2013-10-29 DIAGNOSIS — I10 Essential (primary) hypertension: Secondary | ICD-10-CM | POA: Diagnosis not present

## 2013-10-30 DIAGNOSIS — I4949 Other premature depolarization: Secondary | ICD-10-CM | POA: Diagnosis not present

## 2013-10-30 DIAGNOSIS — E119 Type 2 diabetes mellitus without complications: Secondary | ICD-10-CM | POA: Diagnosis not present

## 2013-10-30 DIAGNOSIS — R079 Chest pain, unspecified: Secondary | ICD-10-CM | POA: Diagnosis not present

## 2013-10-30 DIAGNOSIS — I10 Essential (primary) hypertension: Secondary | ICD-10-CM | POA: Diagnosis not present

## 2013-11-03 DIAGNOSIS — E785 Hyperlipidemia, unspecified: Secondary | ICD-10-CM | POA: Diagnosis not present

## 2013-11-03 DIAGNOSIS — R071 Chest pain on breathing: Secondary | ICD-10-CM | POA: Diagnosis not present

## 2013-11-03 DIAGNOSIS — I10 Essential (primary) hypertension: Secondary | ICD-10-CM | POA: Diagnosis not present

## 2013-11-03 DIAGNOSIS — Z96659 Presence of unspecified artificial knee joint: Secondary | ICD-10-CM | POA: Diagnosis not present

## 2013-11-03 DIAGNOSIS — J45909 Unspecified asthma, uncomplicated: Secondary | ICD-10-CM | POA: Diagnosis not present

## 2013-11-03 DIAGNOSIS — Z8673 Personal history of transient ischemic attack (TIA), and cerebral infarction without residual deficits: Secondary | ICD-10-CM | POA: Diagnosis not present

## 2013-11-03 DIAGNOSIS — R51 Headache: Secondary | ICD-10-CM | POA: Diagnosis not present

## 2013-11-03 DIAGNOSIS — R079 Chest pain, unspecified: Secondary | ICD-10-CM | POA: Diagnosis not present

## 2013-11-03 DIAGNOSIS — I2510 Atherosclerotic heart disease of native coronary artery without angina pectoris: Secondary | ICD-10-CM | POA: Diagnosis not present

## 2013-11-03 DIAGNOSIS — R0789 Other chest pain: Secondary | ICD-10-CM | POA: Diagnosis not present

## 2013-11-03 DIAGNOSIS — R197 Diarrhea, unspecified: Secondary | ICD-10-CM | POA: Diagnosis not present

## 2013-11-03 DIAGNOSIS — Z87442 Personal history of urinary calculi: Secondary | ICD-10-CM | POA: Diagnosis not present

## 2013-11-03 DIAGNOSIS — K219 Gastro-esophageal reflux disease without esophagitis: Secondary | ICD-10-CM | POA: Diagnosis not present

## 2013-11-03 DIAGNOSIS — R0602 Shortness of breath: Secondary | ICD-10-CM | POA: Diagnosis not present

## 2013-11-03 DIAGNOSIS — Z7982 Long term (current) use of aspirin: Secondary | ICD-10-CM | POA: Diagnosis not present

## 2013-11-03 DIAGNOSIS — Z794 Long term (current) use of insulin: Secondary | ICD-10-CM | POA: Diagnosis not present

## 2013-11-04 DIAGNOSIS — I499 Cardiac arrhythmia, unspecified: Secondary | ICD-10-CM | POA: Diagnosis not present

## 2013-11-04 DIAGNOSIS — I440 Atrioventricular block, first degree: Secondary | ICD-10-CM | POA: Diagnosis not present

## 2013-11-04 DIAGNOSIS — R197 Diarrhea, unspecified: Secondary | ICD-10-CM | POA: Diagnosis not present

## 2013-11-04 DIAGNOSIS — R51 Headache: Secondary | ICD-10-CM | POA: Diagnosis not present

## 2013-11-04 DIAGNOSIS — I498 Other specified cardiac arrhythmias: Secondary | ICD-10-CM | POA: Diagnosis not present

## 2013-11-04 DIAGNOSIS — R0602 Shortness of breath: Secondary | ICD-10-CM | POA: Diagnosis not present

## 2013-11-04 DIAGNOSIS — I10 Essential (primary) hypertension: Secondary | ICD-10-CM | POA: Diagnosis not present

## 2013-11-04 DIAGNOSIS — R079 Chest pain, unspecified: Secondary | ICD-10-CM | POA: Diagnosis not present

## 2013-11-04 DIAGNOSIS — R071 Chest pain on breathing: Secondary | ICD-10-CM | POA: Diagnosis not present

## 2013-12-05 DIAGNOSIS — H40039 Anatomical narrow angle, unspecified eye: Secondary | ICD-10-CM | POA: Diagnosis not present

## 2013-12-05 DIAGNOSIS — E119 Type 2 diabetes mellitus without complications: Secondary | ICD-10-CM | POA: Diagnosis not present

## 2013-12-22 ENCOUNTER — Encounter (HOSPITAL_COMMUNITY): Payer: Self-pay | Admitting: Emergency Medicine

## 2013-12-22 ENCOUNTER — Emergency Department (HOSPITAL_COMMUNITY)
Admission: EM | Admit: 2013-12-22 | Discharge: 2013-12-22 | Disposition: A | Discharge status: Home / Self Care | Payer: Medicare Other | Attending: Emergency Medicine | Admitting: Emergency Medicine

## 2013-12-22 ENCOUNTER — Emergency Department (HOSPITAL_COMMUNITY): Payer: Medicare Other

## 2013-12-22 DIAGNOSIS — Z79899 Other long term (current) drug therapy: Secondary | ICD-10-CM | POA: Insufficient documentation

## 2013-12-22 DIAGNOSIS — I10 Essential (primary) hypertension: Secondary | ICD-10-CM | POA: Diagnosis not present

## 2013-12-22 DIAGNOSIS — Z7982 Long term (current) use of aspirin: Secondary | ICD-10-CM | POA: Insufficient documentation

## 2013-12-22 DIAGNOSIS — M129 Arthropathy, unspecified: Secondary | ICD-10-CM | POA: Diagnosis not present

## 2013-12-22 DIAGNOSIS — M25569 Pain in unspecified knee: Secondary | ICD-10-CM | POA: Insufficient documentation

## 2013-12-22 DIAGNOSIS — Z96659 Presence of unspecified artificial knee joint: Secondary | ICD-10-CM | POA: Insufficient documentation

## 2013-12-22 DIAGNOSIS — Z794 Long term (current) use of insulin: Secondary | ICD-10-CM | POA: Diagnosis not present

## 2013-12-22 DIAGNOSIS — E119 Type 2 diabetes mellitus without complications: Secondary | ICD-10-CM | POA: Diagnosis not present

## 2013-12-22 DIAGNOSIS — I2510 Atherosclerotic heart disease of native coronary artery without angina pectoris: Secondary | ICD-10-CM | POA: Insufficient documentation

## 2013-12-22 HISTORY — DX: Atherosclerotic heart disease of native coronary artery without angina pectoris: I25.10

## 2013-12-22 HISTORY — DX: Essential (primary) hypertension: I10

## 2013-12-22 LAB — CBG MONITORING, ED: Glucose-Capillary: 225 mg/dL — ABNORMAL HIGH (ref 70–99)

## 2013-12-22 MED ORDER — HYDROCODONE-ACETAMINOPHEN 5-325 MG PO TABS: 1.0000 | ORAL_TABLET | Freq: Four times a day (QID) | ORAL | Status: DC | PRN

## 2013-12-22 MED ORDER — IBUPROFEN 800 MG PO TABS: 800.0000 mg | ORAL_TABLET | Freq: Three times a day (TID) | ORAL | Status: DC | PRN

## 2013-12-22 NOTE — ED Provider Notes (Signed)
Medical screening examination/treatment/procedure(s) were performed by non-physician practitioner and as supervising physician I was immediately available for consultation/collaboration.   Leota Jacobsen, MD 12/22/13 1535

## 2013-12-22 NOTE — Discharge Instructions (Signed)
Return here as needed.  Follow-up with the orthopedist provided.  Ice and elevate your knee °

## 2013-12-22 NOTE — ED Provider Notes (Signed)
CSN: [id number]     Arrival date & time 12/22/13  9678 History   First MD Initiated Contact with Patient 12/22/13 1027     Chief Complaint  Patient presents with  . Knee Pain  . Hyperglycemia     (Consider location/radiation/quality/duration/timing/severity/associated sxs/prior Treatment) HPI HPI Comments: Edward [name] is a 73 y.o. male with PMH of HTN, Diabetes Mellitus, Arthritis, and s/p bilateral TKA presents to the Emergency Department complaining of left lateral knee pain. Patient states that the pain started 4 days ago and radiates up his left leg to his mid thigh. States that he is able to ambulate. Denies fall or any trauma to the knee. Patient has tried taking ibuprofen for his pain. Denies nausea, vomiting, fever, fatigue, chest pain, SOB, and paresthesias.  Past Medical History  Diagnosis Date  . Hypertension   . Diabetes mellitus without complication   . Arthritis   . CAD (coronary artery disease)   . Chest pain    Past Surgical History  Procedure Laterality Date  . Total knee arthroplasty     History reviewed. No pertinent family history. History  Substance Use Topics  . Smoking status: Never Smoker   . Smokeless tobacco: Not on file  . Alcohol Use: No    Review of Systems All other systems negative except as documented in the HPI. All pertinent positives and negatives as reviewed in the HPI.   Allergies  Review of patient's allergies indicates no known allergies.  Home Medications   Current Outpatient Rx  Name  Route  Sig  Dispense  Refill  . amLODipine (NORVASC) 10 MG tablet   Oral   Take 10 mg by mouth daily.         Marland Kitchen aspirin EC 81 MG tablet   Oral   Take 81 mg by mouth daily.         . clonazePAM (KLONOPIN) 2 MG tablet   Oral   Take 2 mg by mouth at bedtime as needed for anxiety.         . hydroxypropyl methylcellulose (ISOPTO TEARS) 2.5 % ophthalmic solution   Both Eyes   Place 1 drop into both eyes 3 (three) times daily as  needed for dry eyes.         Marland Kitchen ibuprofen (ADVIL,MOTRIN) 200 MG tablet   Oral   Take 400 mg by mouth every 6 (six) hours as needed for headache or mild pain.         Marland Kitchen insulin aspart (NOVOLOG) 100 UNIT/ML injection   Subcutaneous   Inject 60 Units into the skin 2 (two) times daily.         . Vitamin D, Ergocalciferol, (DRISDOL) 50000 UNITS CAPS capsule   Oral   Take 50,000 Units by mouth every 7 (seven) days.          BP 195/69  Pulse 92  Temp(Src) 98.6 F (37 C) (Oral)  Resp 20  SpO2 99% Physical Exam  Nursing note and vitals reviewed. Constitutional: He is oriented to person, place, and time. He appears well-developed and well-nourished. No distress.  HENT:  Head: Normocephalic and atraumatic.  Neck: Normal range of motion. Neck supple.  Cardiovascular: Normal rate.   Pulmonary/Chest: Effort normal and breath sounds normal. No respiratory distress.  Musculoskeletal:       Left knee: He exhibits normal range of motion, no swelling, no effusion and no erythema. Tenderness found.  Neurological: He is alert and oriented to person, place, and time.  Skin: Skin is warm and dry. No rash noted. He is not diaphoretic. No erythema.    ED Course  Procedures (including critical care time)  Patient be referred to orthopedics for further evaluation and care and the patient's x-rays were reviewed and is no acute abnormality.  The patient is advised to ice and elevate his knee.  Told to return here as needed.  The patient be treated with ibuprofen and hydrocodone for his symptoms    Brent General, PA-C 12/22/13 1129

## 2013-12-22 NOTE — ED Notes (Signed)
Per pt, left knee pain since Thursday. Pt able to ambulate.  Pt has hx of knee replacement in 2009.  No fall or trauma.  Pt does have hx of bursitis.

## 2013-12-31 ENCOUNTER — Telehealth: Payer: Self-pay

## 2013-12-31 ENCOUNTER — Ambulatory Visit: Payer: Medicare Other | Admitting: Family Medicine

## 2013-12-31 NOTE — Telephone Encounter (Signed)
Medication List and allergies:  Updated and Reviewed  90 day supply/mail order: n/a Local prescriptions:  [facility], [place] - [address]  Immunization due:  ?  Patient to bring in old records  A/P: No changes to personal, family or [place] Flu - 07/2013 per patient Tdap- within the last 10 years per patient   Patient plans to bring in old records.   To discuss with provider: Wants to discuss referrals to specialist (i.e. Urology, cardiology, etc.) Needs vitamin D refill Would like potassium levels checked.

## 2014-01-07 DIAGNOSIS — M75120 Complete rotator cuff tear or rupture of unspecified shoulder, not specified as traumatic: Secondary | ICD-10-CM | POA: Diagnosis not present

## 2014-01-07 DIAGNOSIS — M19019 Primary osteoarthritis, unspecified shoulder: Secondary | ICD-10-CM | POA: Diagnosis not present

## 2014-01-07 DIAGNOSIS — M67919 Unspecified disorder of synovium and tendon, unspecified shoulder: Secondary | ICD-10-CM | POA: Diagnosis not present

## 2014-01-07 DIAGNOSIS — M719 Bursopathy, unspecified: Secondary | ICD-10-CM | POA: Diagnosis not present

## 2014-01-27 ENCOUNTER — Emergency Department (HOSPITAL_COMMUNITY): Payer: Medicare Other

## 2014-01-27 ENCOUNTER — Encounter (HOSPITAL_COMMUNITY): Payer: Self-pay | Admitting: Emergency Medicine

## 2014-01-27 ENCOUNTER — Inpatient Hospital Stay (HOSPITAL_COMMUNITY)
Admission: EM | Admit: 2014-01-27 | Discharge: 2014-01-29 | DRG: 287 | Disposition: A | Discharge status: Home / Self Care | Payer: Medicare Other | Attending: Cardiology | Admitting: Cardiology

## 2014-01-27 DIAGNOSIS — I10 Essential (primary) hypertension: Secondary | ICD-10-CM | POA: Diagnosis present

## 2014-01-27 DIAGNOSIS — I2510 Atherosclerotic heart disease of native coronary artery without angina pectoris: Principal | ICD-10-CM | POA: Diagnosis present

## 2014-01-27 DIAGNOSIS — E785 Hyperlipidemia, unspecified: Secondary | ICD-10-CM | POA: Diagnosis not present

## 2014-01-27 DIAGNOSIS — Z7982 Long term (current) use of aspirin: Secondary | ICD-10-CM

## 2014-01-27 DIAGNOSIS — E119 Type 2 diabetes mellitus without complications: Secondary | ICD-10-CM | POA: Diagnosis not present

## 2014-01-27 DIAGNOSIS — Z820 Family history of epilepsy and other diseases of the nervous system: Secondary | ICD-10-CM | POA: Diagnosis not present

## 2014-01-27 DIAGNOSIS — Z794 Long term (current) use of insulin: Secondary | ICD-10-CM

## 2014-01-27 DIAGNOSIS — Z8042 Family history of malignant neoplasm of prostate: Secondary | ICD-10-CM

## 2014-01-27 DIAGNOSIS — Z9861 Coronary angioplasty status: Secondary | ICD-10-CM

## 2014-01-27 DIAGNOSIS — I252 Old myocardial infarction: Secondary | ICD-10-CM | POA: Diagnosis not present

## 2014-01-27 DIAGNOSIS — R079 Chest pain, unspecified: Secondary | ICD-10-CM | POA: Diagnosis not present

## 2014-01-27 DIAGNOSIS — I200 Unstable angina: Secondary | ICD-10-CM | POA: Diagnosis present

## 2014-01-27 DIAGNOSIS — I517 Cardiomegaly: Secondary | ICD-10-CM | POA: Diagnosis not present

## 2014-01-27 DIAGNOSIS — J9819 Other pulmonary collapse: Secondary | ICD-10-CM | POA: Diagnosis not present

## 2014-01-27 DIAGNOSIS — Z96659 Presence of unspecified artificial knee joint: Secondary | ICD-10-CM | POA: Diagnosis not present

## 2014-01-27 DIAGNOSIS — Z8249 Family history of ischemic heart disease and other diseases of the circulatory system: Secondary | ICD-10-CM | POA: Diagnosis not present

## 2014-01-27 HISTORY — DX: Type 2 diabetes mellitus without complications: E11.9

## 2014-01-27 HISTORY — DX: Hyperlipidemia, unspecified: E78.5

## 2014-01-27 HISTORY — DX: Transient cerebral ischemic attack, unspecified: G45.9

## 2014-01-27 LAB — PROTIME-INR
INR: 1.01 (ref 0.00–1.49)
INR: 1.12 (ref 0.00–1.49)
PROTHROMBIN TIME: 13.1 s (ref 11.6–15.2)
PROTHROMBIN TIME: 14.2 s (ref 11.6–15.2)

## 2014-01-27 LAB — COMPREHENSIVE METABOLIC PANEL
ALBUMIN: 3.1 g/dL — AB (ref 3.5–5.2)
ALK PHOS: 97 U/L (ref 39–117)
ALT: 19 U/L (ref 0–53)
AST: 17 U/L (ref 0–37)
BUN: 12 mg/dL (ref 6–23)
CALCIUM: 8.4 mg/dL (ref 8.4–10.5)
CO2: 25 mEq/L (ref 19–32)
Chloride: 103 mEq/L (ref 96–112)
Creatinine, Ser: 0.89 mg/dL (ref 0.50–1.35)
GFR calc Af Amer: >90 mL/min (ref >90)
GFR calc non Af Amer: 84 mL/min — ABNORMAL LOW (ref >90)
GLUCOSE: 232 mg/dL — AB (ref 70–99)
POTASSIUM: 3.8 meq/L (ref 3.7–5.3)
Sodium: 140 mEq/L (ref 137–147)
TOTAL PROTEIN: 6.1 g/dL (ref 6.0–8.3)
Total Bilirubin: 0.4 mg/dL (ref 0.3–1.2)

## 2014-01-27 LAB — CBC WITH DIFFERENTIAL/PLATELET
BASOS PCT: 0 % (ref 0–1)
Basophils Absolute: 0 10*3/uL (ref 0.0–0.1)
EOS ABS: 0.2 10*3/uL (ref 0.0–0.7)
EOS PCT: 2 % (ref 0–5)
HEMATOCRIT: 38.1 % — AB (ref 39.0–52.0)
Hemoglobin: 13 g/dL (ref 13.0–17.0)
LYMPHS ABS: 1.6 10*3/uL (ref 0.7–4.0)
Lymphocytes Relative: 24 % (ref 12–46)
MCH: 29.1 pg (ref 26.0–34.0)
MCHC: 34.1 g/dL (ref 30.0–36.0)
MCV: 85.2 fL (ref 78.0–100.0)
Monocytes Absolute: 0.5 10*3/uL (ref 0.1–1.0)
Monocytes Relative: 8 % (ref 3–12)
NEUTROS PCT: 66 % (ref 43–77)
Neutro Abs: 4.3 10*3/uL (ref 1.7–7.7)
Platelets: 184 10*3/uL (ref 150–400)
RBC: 4.47 MIL/uL (ref 4.22–5.81)
RDW: 13 % (ref 11.5–15.5)
WBC: 6.6 10*3/uL (ref 4.0–10.5)

## 2014-01-27 LAB — I-STAT TROPONIN, ED: Troponin i, poc: 0.01 ng/mL (ref 0.00–0.08)

## 2014-01-27 LAB — MRSA PCR SCREENING: MRSA BY PCR: NEGATIVE

## 2014-01-27 LAB — APTT: aPTT: 31 seconds (ref 24–37)

## 2014-01-27 LAB — GLUCOSE, CAPILLARY: Glucose-Capillary: 283 mg/dL — ABNORMAL HIGH (ref 70–99)

## 2014-01-27 LAB — TROPONIN I

## 2014-01-27 MED ORDER — METOPROLOL TARTRATE 25 MG PO TABS
25.0000 mg | ORAL_TABLET | Freq: Three times a day (TID) | ORAL | Status: DC
  Administered 2014-01-27 – 2014-01-28 (×2): 25 mg via ORAL
  Filled 2014-01-27 (×5): qty 1

## 2014-01-27 MED ORDER — NITROGLYCERIN 0.4 MG SL SUBL
0.4000 mg | SUBLINGUAL_TABLET | SUBLINGUAL | Status: DC | PRN
  Administered 2014-01-27: 0.4 mg via SUBLINGUAL
  Filled 2014-01-27: qty 1

## 2014-01-27 MED ORDER — NON FORMULARY: 25.0000 mg | Freq: Every day | Status: DC

## 2014-01-27 MED ORDER — ASPIRIN 81 MG PO CHEW
81.0000 mg | CHEWABLE_TABLET | ORAL | Status: AC
Start: 2014-01-28 — End: 2014-01-28
  Administered 2014-01-28: 81 mg via ORAL
  Filled 2014-01-27: qty 1

## 2014-01-27 MED ORDER — SPIRONOLACTONE 25 MG PO TABS
25.0000 mg | ORAL_TABLET | Freq: Every day | ORAL | Status: DC
  Filled 2014-01-27: qty 1

## 2014-01-27 MED ORDER — INSULIN ASPART PROT & ASPART (70-30 MIX) 100 UNIT/ML ~~LOC~~ SUSP
60.0000 [IU] | Freq: Every day | SUBCUTANEOUS | Status: DC
  Filled 2014-01-27: qty 10

## 2014-01-27 MED ORDER — SODIUM CHLORIDE 0.9 % IJ SOLN
3.0000 mL | Freq: Two times a day (BID) | INTRAMUSCULAR | Status: DC
  Administered 2014-01-28: 3 mL via INTRAVENOUS

## 2014-01-27 MED ORDER — FENTANYL CITRATE 0.05 MG/ML IJ SOLN
100.0000 ug | Freq: Once | INTRAMUSCULAR | Status: AC
  Administered 2014-01-27: 100 ug via INTRAVENOUS
  Filled 2014-01-27: qty 2

## 2014-01-27 MED ORDER — HEPARIN (PORCINE) IN NACL 100-0.45 UNIT/ML-% IJ SOLN
1200.0000 [IU]/h | INTRAMUSCULAR | Status: DC
  Administered 2014-01-27: 1200 [IU]/h via INTRAVENOUS
  Filled 2014-01-27 (×3): qty 250

## 2014-01-27 MED ORDER — SODIUM CHLORIDE 0.9 % IV SOLN
INTRAVENOUS | Status: DC
  Administered 2014-01-28: 04:00:00 via INTRAVENOUS

## 2014-01-27 MED ORDER — ASPIRIN EC 81 MG PO TBEC
81.0000 mg | DELAYED_RELEASE_TABLET | Freq: Every day | ORAL | Status: DC
Start: 2014-01-28 — End: 2014-01-29
  Administered 2014-01-29: 81 mg via ORAL
  Filled 2014-01-27 (×2): qty 1

## 2014-01-27 MED ORDER — AMLODIPINE BESYLATE 10 MG PO TABS
10.0000 mg | ORAL_TABLET | Freq: Every day | ORAL | Status: DC
  Administered 2014-01-27 – 2014-01-29 (×3): 10 mg via ORAL
  Filled 2014-01-27 (×4): qty 1

## 2014-01-27 MED ORDER — ATORVASTATIN CALCIUM 40 MG PO TABS
40.0000 mg | ORAL_TABLET | Freq: Every day | ORAL | Status: DC
  Administered 2014-01-28: 22:00:00 40 mg via ORAL
  Filled 2014-01-27 (×3): qty 1

## 2014-01-27 MED ORDER — CLONAZEPAM 1 MG PO TABS
2.0000 mg | ORAL_TABLET | Freq: Every evening | ORAL | Status: DC | PRN
  Administered 2014-01-28: 1 mg via ORAL
  Filled 2014-01-27: qty 2

## 2014-01-27 MED ORDER — SODIUM CHLORIDE 0.9 % IJ SOLN: 3.0000 mL | INTRAMUSCULAR | Status: DC | PRN

## 2014-01-27 MED ORDER — SODIUM CHLORIDE 0.9 % IV SOLN: 250.0000 mL | INTRAVENOUS | Status: DC | PRN

## 2014-01-27 MED ORDER — NITROGLYCERIN IN D5W 200-5 MCG/ML-% IV SOLN
10.0000 ug/min | INTRAVENOUS | Status: DC
  Administered 2014-01-27: 10 ug/min via INTRAVENOUS
  Filled 2014-01-27: qty 250

## 2014-01-27 MED ORDER — PANTOPRAZOLE SODIUM 40 MG PO TBEC
40.0000 mg | DELAYED_RELEASE_TABLET | Freq: Every day | ORAL | Status: DC
  Administered 2014-01-27 – 2014-01-29 (×3): 40 mg via ORAL
  Filled 2014-01-27 (×3): qty 1

## 2014-01-27 MED ORDER — INSULIN ASPART PROT & ASPART (70-30 MIX) 100 UNIT/ML ~~LOC~~ SUSP
30.0000 [IU] | Freq: Every day | SUBCUTANEOUS | Status: DC
  Filled 2014-01-27: qty 10

## 2014-01-27 MED ORDER — EPLERENONE 25 MG PO TABS
25.0000 mg | ORAL_TABLET | Freq: Every day | ORAL | Status: DC
  Administered 2014-01-27 – 2014-01-29 (×3): 25 mg via ORAL
  Filled 2014-01-27 (×3): qty 1

## 2014-01-27 MED ORDER — HEPARIN BOLUS VIA INFUSION
4000.0000 [IU] | Freq: Once | INTRAVENOUS | Status: AC
  Administered 2014-01-27: 4000 [IU] via INTRAVENOUS
  Filled 2014-01-27: qty 4000

## 2014-01-27 MED ORDER — INSULIN ASPART PROT & ASPART (70-30 MIX) 100 UNIT/ML ~~LOC~~ SUSP: 60.0000 [IU] | Freq: Two times a day (BID) | SUBCUTANEOUS | Status: DC

## 2014-01-27 NOTE — ED Notes (Signed)
Pt Dropped pressure dr aware nitro drip downto 1.5 cc or 3 mcg

## 2014-01-27 NOTE — ED Provider Notes (Signed)
CSN: [id number]     Arrival date & time 01/27/14  1002 History   First MD Initiated Contact with Patient 01/27/14 1031     Chief Complaint  Patient presents with  . Chest Pain     (Consider location/radiation/quality/duration/timing/severity/associated sxs/prior Treatment) HPI 73 year old male with a history of coronary artery disease myocardial infarction diabetes hypertension presents with about 6 hours substernal chest pressure like an elephant on his chest with prior to arrival having some shortness of breath nausea and sweats for nausea sweats and shortness of breath and now resolved but he still has some mild to moderate chest pressure with radiation to his left arm with treatment prior to arrival consisting of one baby aspirin at home, 4 baby aspirin by EMS as well as nitroglycerin prior to arrival from EMS which improved his pain to a mild pain but it is now gradually becoming moderate again with no fever no cough no abdominal pain but he does notice trace edema today to his ankles; his pain is not sharp stabbing or pleuritic. He woke up with his pain at about 4:30 this morning. This pain feels similar to when he had his prior mild heart attack for which he underwent cardiac catheterization but did not require intervention. He moved to the area a couple months ago does not have a primary care doctor or cardiologist in this area yet. Past Medical History  Diagnosis Date  . Hypertension   . Arthritis   . CAD (coronary artery disease)     a. Pt reports "small heart attack" in 2013 at [facility], denies need for stent or CABG, does not know findings of cath.  . Pneumonia   . TIA (transient ischemic attack)   . Dyslipidemia, goal LDL below 70 01/29/2014  . DM type 2 (diabetes mellitus, type 2), insulin dependent 01/29/2014   Past Surgical History  Procedure Laterality Date  . Total knee arthroplasty    . Cardiac catheterization  01/28/14    + CAD treat medically   Family  History  Problem Relation Age of Onset  . CAD Brother     2 brothers - CABG  . Alzheimer's disease Sister   . CAD Sister   . Prostate cancer Brother    History  Substance Use Topics  . Smoking status: Never Smoker   . Smokeless tobacco: Not on file  . Alcohol Use: No    Review of Systems 10 Systems reviewed and are negative for acute change except as noted in the HPI.   Allergies  Review of patient's allergies indicates no known allergies.  Home Medications   Current Outpatient Rx  Name  Route  Sig  Dispense  Refill  . acetaminophen (TYLENOL) 325 MG tablet   Oral   Take 2 tablets (650 mg total) by mouth every 6 (six) hours as needed for moderate pain.         Marland Kitchen amLODipine (NORVASC) 10 MG tablet   Oral   Take 1 tablet (10 mg total) by mouth daily.   30 tablet   6   . atorvastatin (LIPITOR) 40 MG tablet   Oral   Take 1 tablet (40 mg total) by mouth daily at 6 PM.   30 tablet   6   . clonazePAM (KLONOPIN) 1 MG tablet   Oral   Take 1 tablet (1 mg total) by mouth at bedtime as needed for anxiety.   30 tablet   0   . eplerenone (INSPRA) 25 MG tablet  Oral   Take 1 tablet (25 mg total) by mouth daily.   30 tablet   0   . glucose blood (BL TEST STRIP PACK) test strip      Use as instructed   100 each   2     For one step ultra machine   . insulin aspart (NOVOLOG) 100 UNIT/ML injection   Subcutaneous   Inject 10-20 Units into the skin 2 (two) times daily.   10 mL   1   . insulin aspart protamine- aspart (NOVOLOG MIX 70/30) (70-30) 100 UNIT/ML injection   Subcutaneous   Inject 0.6 mLs (60 Units total) into the skin 2 (two) times daily with a meal.   10 mL   1   . Insulin Syringes, Disposable, U-100 1 ML MISC   Does not apply   60 Units by Does not apply route 4 (four) times daily.   120 each   2   . Lancets (ONETOUCH ULTRASOFT) lancets      Use as instructed   100 each   12   . lisinopril (PRINIVIL,ZESTRIL) 10 MG tablet   Oral   Take 1  tablet (10 mg total) by mouth daily.   30 tablet   6   . metoprolol (LOPRESSOR) 50 MG tablet   Oral   Take 1 tablet (50 mg total) by mouth 2 (two) times daily.   60 tablet   6   . nitroGLYCERIN (NITROSTAT) 0.4 MG SL tablet   Sublingual   Place 1 tablet (0.4 mg total) under the tongue every 5 (five) minutes as needed for chest pain (CP or SOB).   25 tablet   4   . pantoprazole (PROTONIX) 40 MG tablet   Oral   Take 1 tablet (40 mg total) by mouth daily at 6 (six) AM.   30 tablet   2    BP 166/61  Pulse 83  Temp(Src) 97.4 F (36.3 C) (Oral)  Resp 18  Ht 5\' 8"  (1.727 m)  Wt 271 lb 2.7 oz (123 kg)  BMI 41.24 kg/m2  SpO2 97% Physical Exam  Nursing note and vitals reviewed. Constitutional:  Awake, alert, nontoxic appearance.  HENT:  Head: Atraumatic.  Eyes: Right eye exhibits no discharge. Left eye exhibits no discharge.  Neck: Neck supple.  Cardiovascular: Normal rate and regular rhythm.   No murmur heard. Pulmonary/Chest: Effort normal and breath sounds normal. No respiratory distress. He has no wheezes. He has no rales. He exhibits no tenderness.  Abdominal: Soft. Bowel sounds are normal. He exhibits no distension. There is no tenderness. There is no rebound and no guarding.  Musculoskeletal: He exhibits edema. He exhibits no tenderness.  Baseline ROM, no obvious new focal weakness. Trace edema to ankles.  Neurological:  Mental status and motor strength appears baseline for patient and situation.  Skin: No rash noted.  Psychiatric: He has a normal mood and affect.    ED Course  Procedures (including critical care time) Patient / Family / Caregiver understand and agree with initial ED impression and plan with expectations set for ED visit. 1012 ECG Muse not working: Sinus rhythm, ventricular rate 94, normal axis, anterior Q waves, slight nondiagnostic ST elevation in leads V2 and V3 without prior ECG available for comparison Pt feels improved after observation  and/or treatment in ED.Patient informed of clinical course, understand medical decision-making process, and agree with plan.  D/w cards to admit. Labs Review Labs Reviewed  CBC WITH DIFFERENTIAL - Abnormal; Notable for  the following:    HCT 38.1 (*)    All other components within normal limits  COMPREHENSIVE METABOLIC PANEL - Abnormal; Notable for the following:    Glucose, Bld 232 (*)    Albumin 3.1 (*)    GFR calc non Af Amer 84 (*)    All other components within normal limits  CBC - Abnormal; Notable for the following:    Hemoglobin 12.6 (*)    HCT 36.8 (*)    All other components within normal limits  HEMOGLOBIN A1C - Abnormal; Notable for the following:    Hemoglobin A1C 9.1 (*)    Mean Plasma Glucose 214 (*)    All other components within normal limits  BASIC METABOLIC PANEL - Abnormal; Notable for the following:    Glucose, Bld 285 (*)    Calcium 8.3 (*)    GFR calc non Af Amer 86 (*)    All other components within normal limits  HEPATIC FUNCTION PANEL - Abnormal; Notable for the following:    Total Protein 5.6 (*)    Albumin 2.7 (*)    All other components within normal limits  LIPID PANEL - Abnormal; Notable for the following:    HDL 32 (*)    All other components within normal limits  GLUCOSE, CAPILLARY - Abnormal; Notable for the following:    Glucose-Capillary 283 (*)    All other components within normal limits  GLUCOSE, CAPILLARY - Abnormal; Notable for the following:    Glucose-Capillary 249 (*)    All other components within normal limits  GLUCOSE, CAPILLARY - Abnormal; Notable for the following:    Glucose-Capillary 265 (*)    All other components within normal limits  GLUCOSE, CAPILLARY - Abnormal; Notable for the following:    Glucose-Capillary 180 (*)    All other components within normal limits  GLUCOSE, CAPILLARY - Abnormal; Notable for the following:    Glucose-Capillary 336 (*)    All other components within normal limits  GLUCOSE, CAPILLARY -  Abnormal; Notable for the following:    Glucose-Capillary 330 (*)    All other components within normal limits  GLUCOSE, CAPILLARY - Abnormal; Notable for the following:    Glucose-Capillary 218 (*)    All other components within normal limits  MRSA PCR SCREENING  APTT  PROTIME-INR  TROPONIN I  TROPONIN I  TROPONIN I  PROTIME-INR  TSH  HEPARIN LEVEL (UNFRACTIONATED)  I-STAT TROPOININ, ED   Imaging Review No results found.   EKG Interpretation None      MDM   Final diagnoses:  Unstable angina  Hypertension  CAD (coronary artery disease)  DM type 2 (diabetes mellitus, type 2)  Dyslipidemia, goal LDL below 70  HTN (hypertension)    The patient appears reasonably stabilized for admission considering the current resources, flow, and capabilities available in the ED at this time, and I doubt any other [facility] requiring further screening and/or treatment in the ED prior to admission.    Babette Relic, MD 01/29/14 [phone]

## 2014-01-27 NOTE — ED Notes (Signed)
Severe cp and sob that started  About 4 am  Left chest pain just moved here form [place]  Had a sweating episode tightness in chest  and his sugar was up 300s and bp was up has  A cardic hx

## 2014-01-27 NOTE — Progress Notes (Signed)
ANTICOAGULATION CONSULT NOTE - Initial Consult  Pharmacy Consult for heparin Indication: chest pain/ACS  No Known Allergies  Patient Measurements: Height: 5\' 8"  (172.7 cm) Weight: 271 lb (122.925 kg) IBW/kg (Calculated) : 68.4 Heparin Dosing Weight: 97kg  Vital Signs: Temp: 97.9 F (36.6 C) (03/30 1014) Temp src: Oral (03/30 1014) BP: 110/65 mmHg (03/30 1658) Pulse Rate: 68 (03/30 1658)  Labs:  Recent Labs  01/27/14 1125  HGB 13.0  HCT 38.1*  PLT 184  CREATININE 0.89    Estimated Creatinine Clearance: 97.1 ml/min (by C-G formula based on Cr of 0.89).   Medical History: Past Medical History  Diagnosis Date  . Hypertension   . Diabetes mellitus without complication   . Arthritis   . CAD (coronary artery disease)     a. Pt reports "small heart attack" in 2013 at [facility], denies need for stent or CABG, does not know findings of cath.    Assessment: 10 YOM admitted with diaphoresis and chest pressure for last week. 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 Patient is not currently on any anticoagulants (states he was on warfarin in 2009 when he had both of his knees replaced.) has not had any bleeding or bruising since dark tarry stools stopped ~3 weeks ago. Baseline Hgb 13, plts 184.  Goal of Therapy:  Heparin level 0.3-0.7 units/ml Monitor platelets by anticoagulation protocol: Yes   Plan:  1. Stat aPTT and INR for baseline values 2. Heparin bolus with 4000 units IV x1 3. Start heparin drip at 1200 units/hr 4. Heparin level in 8 hours 5. Daily HL and CBC 6. Follow up after cath (planning for Tuesday)  Gaetan Spieker D. Dorathea Faerber, PharmD, BCPS Clinical Pharmacist Pager: [phone] 01/27/2014 5:31 PM

## 2014-01-27 NOTE — ED Notes (Signed)
Pt states feels much better.

## 2014-01-27 NOTE — ED Notes (Signed)
Pt states that pain is still there after and feels like a pressure

## 2014-01-27 NOTE — ED Notes (Signed)
Contacted [facility] about bed request

## 2014-01-27 NOTE — Interval H&P Note (Signed)
Cath Lab Visit (complete for each Cath Lab visit)  Clinical Evaluation Leading to the Procedure:   ACS: yes  Non-ACS:    Anginal Classification: CCS IV  Anti-ischemic medical therapy: Minimal Therapy (1 class of medications)  Non-Invasive Test Results: No non-invasive testing performed  Prior CABG: No previous CABG      History and Physical Interval Note:  01/27/2014 7:43 PM  Edward [name]  has presented today for surgery, with the diagnosis of CP  The various methods of treatment have been discussed with the patient and family. After consideration of risks, benefits and other options for treatment, the patient has consented to  Procedure(s): LEFT HEART CATHETERIZATION WITH CORONARY ANGIOGRAM (N/A) as a surgical intervention .  The patient's history has been reviewed, patient examined, no change in status, stable for surgery.  I have reviewed the patient's chart and labs.  Questions were answered to the patient's satisfaction.     Sinclair Grooms

## 2014-01-27 NOTE — ED Notes (Signed)
Pt reports left sided chest pain since 430 am. States pain has moved from chest to left arm/shoulder. Pt diaphoretic on EMS arrival. BP 210/100 initial. Pt given nitro x 3 SL. Pain 9/10 on arrival. HR sinus rhythm, tachy at 100. IV20G L hand

## 2014-01-27 NOTE — H&P (Signed)
History and Physical  Patient ID: ALECXANDER [name] MRN: [id number], DOB: 06/14/1941 Date of Encounter: 01/27/2014, 4:35 PM Primary Physician: No PCP Per Patient Primary Cardiologist: New to Dr. Debara Pickett  Chief Complaint: CP Reason for Admission: CP  HPI: Mr. Edward [name] is a 73 y/o M who moved here in February from [place] with history of HTN, DM, and "small heart attack" in 2013 without need for stent or CABG by cath. He is unsure of the cath results. He is still trying to establish primary care here and recently ran out of his Norvasc 1 week ago along with difficulty obtaining his insulin. Over the past few weeks he has noticed that upon exertion such as walking or sweeping he gets diaphoresis and chest pressure that resolves with rest. On Sunday 01/24/14 he was at a church function acting as the photographer and had an episode while walking around, again resolved with rest. This morning around 4 am he awoke with substernal chest pressure like an elephant sitting on his chest. This was associated with SOB and diaphoresis, perhaps increased awareness of heart palpitations. No nausea, vomiting, syncope. He took a baby ASA and continued to rest. At 8:30am when pain did not improve he pressed his LifeAlert button and EMS was called. He received 3 additional ASA and SL NTG. Presenting BP was 210/100. He was given fentanyl, SL NTG, and then NTG gtt but pressure bottomed to 85/50. It has since come back to the 150-160 range. The pain is much improved but he can still slightly feel a reminder of it. He endorses trace LEE. Troponin neg x 1. He says he was admitted in [place] in 10/2013 for chest pain and was diagnosed with a pulled muscle. He denies cardiac stress test or cath at that time.  He also says that once a week since moving here in February he had dark tarry stools. No frank blood. This happened once a week up until about 3 weeks ago. He has since discontinued Advil which he was taking for R shoulder pain and has  had no further episodes.   Past Medical History  Diagnosis Date  . Hypertension   . Diabetes mellitus without complication   . Arthritis   . CAD (coronary artery disease)     a. Pt reports "small heart attack" in 2013 at [facility], denies need for stent or CABG, does not know findings of cath.     Most Recent Cardiac Studies: Unavailable   Surgical History:  Past Surgical History  Procedure Laterality Date  . Total knee arthroplasty       Home Meds: Prior to Admission medications   Medication Sig Start Date End Date Taking? Authorizing Provider  amLODipine (NORVASC) 10 MG tablet Take 10 mg by mouth daily.   No Historical Provider, MD  aspirin EC 81 MG tablet Take 81 mg by mouth daily.   Yes Historical Provider, MD  clonazePAM (KLONOPIN) 2 MG tablet Take 2 mg by mouth at bedtime as needed for anxiety.   Yes Historical Provider, MD  eplerenone (INSPRA) 25 MG tablet Take 25 mg by mouth daily.   Yes Historical Provider, MD  insulin aspart (NOVOLOG) 100 UNIT/ML injection Inject 10-20 Units into the skin 2 (two) times daily.    Yes Historical Provider, MD  insulin aspart protamine- aspart (NOVOLOG MIX 70/30) (70-30) 100 UNIT/ML injection Inject 60 Units into the skin 2 (two) times daily with a meal.   Yes Historical Provider, MD    Allergies:  No Known Allergies  History   Social History  . Marital Status: Married    Spouse Name: N/A    Number of Children: N/A  . Years of Education: N/A   Occupational History  . Not on file.   Social History Main Topics  . Smoking status: Never Smoker   . Smokeless tobacco: Not on file  . Alcohol Use: No  . Drug Use: No  . Sexual Activity: Not on file   Other Topics Concern  . Not on file   Social History Narrative  . No narrative on file     Family History  Problem Relation Age of Onset  . CAD Brother     2 brothers - CABG  . Alzheimer's disease Sister   . CAD Sister   . Prostate cancer Brother      Review of Systems: General: negative for chills, fever, or weight changes.  Cardiovascular: negative for chest pain, edema, orthopnea, palpitations, paroxysmal nocturnal dyspnea, shortness of breath or dyspnea on exertion Dermatological: negative for rash Respiratory: negative for cough or wheezing Urologic: negative for hematuria Abdominal: negative for nausea, vomiting, diarrhea, bright red blood per rectum, melena, or hematemesis Neurologic: negative for visual changes, syncope, or dizziness All other systems reviewed and are otherwise negative except as noted above.  Labs:   Lab Results  Component Value Date   WBC 6.6 01/27/2014   HGB 13.0 01/27/2014   HCT 38.1* 01/27/2014   MCV 85.2 01/27/2014   PLT 184 01/27/2014     Recent Labs Lab 01/27/14 1125  NA 140  K 3.8  CL 103  CO2 25  BUN 12  CREATININE 0.89  CALCIUM 8.4  PROT 6.1  BILITOT 0.4  ALKPHOS 97  ALT 19  AST 17  GLUCOSE 232*   Radiology/Studies:  Dg Chest Port 1 View 01/27/2014   CLINICAL DATA:  Chest pain.  EXAM: PORTABLE CHEST - 1 VIEW  COMPARISON:  None.  FINDINGS: The heart size and mediastinal contours are within normal limits. Lungs show bibasilar atelectasis. The heart size is at the upper limits of normal. No overt edema, focal airspace consolidation or pleural fluid is identified. The visualized shoulder shows degenerative changes.  IMPRESSION: Borderline heart size.  Low volumes with bibasilar atelectasis.   Electronically Signed   By: Aletta Edouard M.D.   On: 01/27/2014 11:05     EKG: NSR 94bpm old anterior infarct, nonspecific ST-T changes  Physical Exam: Blood pressure 168/67, pulse 70, temperature 97.9 F (36.6 C), temperature source Oral, resp. rate 17, SpO2 100.00%. General: Well developed, well nourished overweight AAM in no acute distress. Head: Normocephalic, atraumatic, sclera non-icteric, no xanthomas, nares are without discharge.  Neck:  JVD not elevated. Lungs: Clear bilaterally to  auscultation without wheezes, rales, or rhonchi. Breathing is unlabored. Heart: RRR with S1 S2. No murmurs, rubs, or gallops appreciated. Abdomen: Soft, non-tender, non-distended with normoactive bowel sounds. No hepatomegaly. No rebound/guarding. No obvious abdominal masses. Msk:  Strength and tone appear normal for age. Extremities: No clubbing or cyanosis. Tr BLE edema.  Distal pedal pulses are 2+ and equal bilaterally. Neuro: Alert and oriented X 3. No focal deficit. No facial asymmetry. Moves all extremities spontaneously. Psych:  Responds to questions appropriately with a normal affect.    ASSESSMENT AND PLAN:   1. Chest pain concerning for [place] with possible history of prior CAD/small MI 2. HTN, uncontrolled, out of amlodipine x 1 week 3. Diabetes mellitus 4. Melanotic-type stools 3 weeks ago on Advil  without recurrence, no BRBPR  Symptoms are fairly classic for angina. Initial troponin interestingly was negative. Will admit, cycle enzymes, start IV heparin per pharmacy, continue aspirin, continue IV NTG, restart Norvasc, add beta blocker, add Lipitor 40mg  qhs, add PPI. Plan cath tomorrow. R/B discussed with patient who agrees to proceed.    Signed, Melina Copa PA-C 01/27/2014, 4:35 PM  Patient seen with PA, agree with the above note.  His symptoms, as noted above, are concerning for angina progressing to ACS.  It is possible that he could be symptomatic from uncontrolled BP (out of amlodipine x 1 wk and BP very high) but I am concerned for ACS.  Initial enzymes negative.  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  Pain down to 1/10 on NTG gtt.  - Continue to cycle CEs.  - I discussed risks/benefits of cardiac catheterization with patient, think this is best course.  Will plan for tomorrow am.  - Continue NTG gtt, start heparin gtt.  - Restart amlodipine and will give metoprolol 25 mg every 8 hrs - Not on statin.  Even if no CAD, would benefit given DM.  Will start atorva 40.  - Echo.   Loralie Champagne 01/27/2014 4:41 PM

## 2014-01-27 NOTE — ED Notes (Signed)
Dr bednar into reassess pt reminded to tell him that he has not gotten pcp since moving here and needs more 70/30 insulin pt to be admitted

## 2014-01-28 ENCOUNTER — Encounter (HOSPITAL_COMMUNITY): Payer: Self-pay | Admitting: *Deleted

## 2014-01-28 ENCOUNTER — Encounter (HOSPITAL_COMMUNITY)
Admission: EM | Disposition: A | Discharge status: Home / Self Care | Payer: Self-pay | Source: Home / Self Care | Attending: Cardiology

## 2014-01-28 DIAGNOSIS — I2510 Atherosclerotic heart disease of native coronary artery without angina pectoris: Principal | ICD-10-CM

## 2014-01-28 DIAGNOSIS — E119 Type 2 diabetes mellitus without complications: Secondary | ICD-10-CM | POA: Diagnosis not present

## 2014-01-28 DIAGNOSIS — I517 Cardiomegaly: Secondary | ICD-10-CM

## 2014-01-28 DIAGNOSIS — I10 Essential (primary) hypertension: Secondary | ICD-10-CM | POA: Diagnosis not present

## 2014-01-28 DIAGNOSIS — I200 Unstable angina: Secondary | ICD-10-CM | POA: Diagnosis not present

## 2014-01-28 HISTORY — PX: CARDIAC CATHETERIZATION: SHX172

## 2014-01-28 HISTORY — PX: LEFT HEART CATHETERIZATION WITH CORONARY ANGIOGRAM: SHX5451

## 2014-01-28 LAB — BASIC METABOLIC PANEL
BUN: 10 mg/dL (ref 6–23)
CALCIUM: 8.3 mg/dL — AB (ref 8.4–10.5)
CO2: 25 mEq/L (ref 19–32)
CREATININE: 0.85 mg/dL (ref 0.50–1.35)
Chloride: 101 mEq/L (ref 96–112)
GFR calc non Af Amer: 86 mL/min — ABNORMAL LOW (ref >90)
Glucose, Bld: 285 mg/dL — ABNORMAL HIGH (ref 70–99)
Potassium: 4 mEq/L (ref 3.7–5.3)
Sodium: 138 mEq/L (ref 137–147)

## 2014-01-28 LAB — LIPID PANEL
Cholesterol: 151 mg/dL (ref 0–200)
HDL: 32 mg/dL — ABNORMAL LOW (ref >39)
LDL CALC: 92 mg/dL (ref 0–99)
Total CHOL/HDL Ratio: 4.7 RATIO
Triglycerides: 134 mg/dL (ref <150)
VLDL: 27 mg/dL (ref 0–40)

## 2014-01-28 LAB — GLUCOSE, CAPILLARY
GLUCOSE-CAPILLARY: 336 mg/dL — AB (ref 70–99)
Glucose-Capillary: 249 mg/dL — ABNORMAL HIGH (ref 70–99)
Glucose-Capillary: 265 mg/dL — ABNORMAL HIGH (ref 70–99)
Glucose-Capillary: 180 mg/dL — ABNORMAL HIGH (ref 70–99)
Glucose-Capillary: 330 mg/dL — ABNORMAL HIGH (ref 70–99)

## 2014-01-28 LAB — HEPATIC FUNCTION PANEL
ALT: 17 U/L (ref 0–53)
AST: 14 U/L (ref 0–37)
Albumin: 2.7 g/dL — ABNORMAL LOW (ref 3.5–5.2)
Alkaline Phosphatase: 88 U/L (ref 39–117)
BILIRUBIN TOTAL: 0.4 mg/dL (ref 0.3–1.2)
Bilirubin, Direct: <0.2 mg/dL (ref 0.0–0.3)
Total Protein: 5.6 g/dL — ABNORMAL LOW (ref 6.0–8.3)

## 2014-01-28 LAB — CBC
HCT: 36.8 % — ABNORMAL LOW (ref 39.0–52.0)
HEMOGLOBIN: 12.6 g/dL — AB (ref 13.0–17.0)
MCH: 29.2 pg (ref 26.0–34.0)
MCHC: 34.2 g/dL (ref 30.0–36.0)
MCV: 85.4 fL (ref 78.0–100.0)
Platelets: 194 10*3/uL (ref 150–400)
RBC: 4.31 MIL/uL (ref 4.22–5.81)
RDW: 12.9 % (ref 11.5–15.5)
WBC: 6.6 10*3/uL (ref 4.0–10.5)

## 2014-01-28 LAB — HEMOGLOBIN A1C
HEMOGLOBIN A1C: 9.1 % — AB (ref <5.7)
Mean Plasma Glucose: 214 mg/dL — ABNORMAL HIGH (ref <117)

## 2014-01-28 LAB — TSH: TSH: 1.37 u[IU]/mL

## 2014-01-28 LAB — HEPARIN LEVEL (UNFRACTIONATED): HEPARIN UNFRACTIONATED: 0.31 [IU]/mL (ref 0.30–0.70)

## 2014-01-28 LAB — TROPONIN I: Troponin I: <0.3 ng/mL (ref <0.30)

## 2014-01-28 SURGERY — LEFT HEART CATHETERIZATION WITH CORONARY ANGIOGRAM
Anesthesia: LOCAL

## 2014-01-28 MED ORDER — FENTANYL CITRATE 0.05 MG/ML IJ SOLN
INTRAMUSCULAR | Status: AC
  Filled 2014-01-28: qty 2

## 2014-01-28 MED ORDER — MIDAZOLAM HCL 2 MG/2ML IJ SOLN
INTRAMUSCULAR | Status: AC
  Filled 2014-01-28: qty 2

## 2014-01-28 MED ORDER — NITROGLYCERIN 0.2 MG/ML ON CALL CATH LAB
INTRAVENOUS | Status: AC
  Filled 2014-01-28: qty 1

## 2014-01-28 MED ORDER — HEPARIN SODIUM (PORCINE) 1000 UNIT/ML IJ SOLN
INTRAMUSCULAR | Status: AC
  Filled 2014-01-28: qty 1

## 2014-01-28 MED ORDER — VERAPAMIL HCL 2.5 MG/ML IV SOLN
INTRAVENOUS | Status: AC
  Filled 2014-01-28: qty 2

## 2014-01-28 MED ORDER — INSULIN ASPART 100 UNIT/ML ~~LOC~~ SOLN
0.0000 [IU] | Freq: Three times a day (TID) | SUBCUTANEOUS | Status: DC
  Administered 2014-01-28: 3 [IU] via SUBCUTANEOUS
  Administered 2014-01-28: 5 [IU] via SUBCUTANEOUS
  Administered 2014-01-28: 9 [IU] via SUBCUTANEOUS
  Administered 2014-01-29: 3 [IU] via SUBCUTANEOUS

## 2014-01-28 MED ORDER — INSULIN GLARGINE 100 UNIT/ML ~~LOC~~ SOLN
25.0000 [IU] | Freq: Once | SUBCUTANEOUS | Status: AC
  Administered 2014-01-28: 22:00:00 25 [IU] via SUBCUTANEOUS
  Filled 2014-01-28 (×2): qty 0.25

## 2014-01-28 MED ORDER — LIDOCAINE HCL (PF) 1 % IJ SOLN
INTRAMUSCULAR | Status: AC
  Filled 2014-01-28: qty 30

## 2014-01-28 MED ORDER — HEPARIN (PORCINE) IN NACL 2-0.9 UNIT/ML-% IJ SOLN
INTRAMUSCULAR | Status: AC
Start: 2014-01-28 — End: 2014-01-28
  Filled 2014-01-28: qty 1000

## 2014-01-28 MED ORDER — ACETAMINOPHEN 325 MG PO TABS
650.0000 mg | ORAL_TABLET | Freq: Four times a day (QID) | ORAL | Status: DC | PRN
  Administered 2014-01-28: 650 mg via ORAL

## 2014-01-28 MED ORDER — OXYCODONE-ACETAMINOPHEN 5-325 MG PO TABS
1.0000 | ORAL_TABLET | ORAL | Status: DC | PRN
  Filled 2014-01-28: qty 2

## 2014-01-28 MED ORDER — METOPROLOL TARTRATE 50 MG PO TABS
50.0000 mg | ORAL_TABLET | Freq: Two times a day (BID) | ORAL | Status: DC
  Administered 2014-01-28 – 2014-01-29 (×2): 50 mg via ORAL
  Filled 2014-01-28 (×3): qty 1

## 2014-01-28 NOTE — Interval H&P Note (Signed)
History and Physical Interval Note:  01/28/2014 1:56 PM Cath Lab Visit (complete for each Cath Lab visit)  Clinical Evaluation Leading to the Procedure:   ACS: yes  Non-ACS:    Anginal Classification: CCS III  Anti-ischemic medical therapy: No Therapy  Non-Invasive Test Results: No non-invasive testing performed  Prior CABG: No previous CABG       [name]  has presented today for surgery, with the diagnosis of CP  The various methods of treatment have been discussed with the patient and family. After consideration of risks, benefits and other options for treatment, the patient has consented to  Procedure(s): LEFT HEART CATHETERIZATION WITH CORONARY ANGIOGRAM (N/A) as a surgical intervention .  The patient's history has been reviewed, patient examined, no change in status, stable for surgery.  I have reviewed the patient's chart and labs.  Questions were answered to the patient's satisfaction.     Sinclair Grooms

## 2014-01-28 NOTE — Progress Notes (Deleted)
[place] for heparin Indication: chest pain/ACS  No Known Allergies  Patient Measurements: Height: 5\' 8"  (172.7 cm) Weight: 270 lb 8.1 oz (122.7 kg) IBW/kg (Calculated) : 68.4 Heparin Dosing Weight: 97kg  Vital Signs: Temp: 97.7 F (36.5 C) (03/31 0000) Temp src: Oral (03/31 0000) BP: 160/52 mmHg (03/31 0000) Pulse Rate: 65 (03/31 0000)  Labs:  Recent Labs  01/27/14 1125 01/27/14 1732 01/27/14 2300  HGB 13.0  --   --   HCT 38.1*  --   --   PLT 184  --   --   APTT  --  31  --   LABPROT  --  13.1 14.2  INR  --  1.01 1.12  CREATININE 0.89  --   --   TROPONINI  --   --  <0.30    Estimated Creatinine Clearance: 97 ml/min (by C-G formula based on Cr of 0.89).   Medical History: Past Medical History  Diagnosis Date  . Hypertension   . Diabetes mellitus without complication   . Arthritis   . CAD (coronary artery disease)     a. Pt reports "small heart attack" in 2013 at [facility], denies need for stent or CABG, does not know findings of cath.    Assessment: 30 YOM admitted with diaphoresis and chest pressure for last week. 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 Patient is not currently on any anticoagulants (states he was on warfarin in 2009 when he had both of his knees replaced.) has not had any bleeding or bruising since dark tarry stools stopped ~3 weeks ago. Baseline Hgb 13, plts 184. Initial heparin level 0.89 (~5 hours after start)  Goal of Therapy:  Heparin level 0.3-0.7 units/ml Monitor platelets by anticoagulation protocol: Yes   Plan:  1. Dec heparin drip to 1000 units/hr 2. Heparin level in 6 hours 3. Daily HL and CBC  Excell Seltzer, PharmD Clinical Pharmacist Pager: [phone] 01/28/2014 1:00 AM

## 2014-01-28 NOTE — Progress Notes (Signed)
ANTICOAGULATION CONSULT NOTE : Follow Up  Pharmacy Consult for heparin Indication: chest pain/ACS  No Known Allergies  Patient Measurements: Height: 5\' 8"  (172.7 cm) Weight: 270 lb 8.1 oz (122.7 kg) IBW/kg (Calculated) : 68.4 Heparin Dosing Weight: 97kg  Vital Signs: Temp: 98 F (36.7 C) (03/31 0800) Temp src: Oral (03/31 0800) BP: 124/46 mmHg (03/31 0900) Pulse Rate: 65 (03/31 0900)  Labs:  Recent Labs  01/27/14 1125 01/27/14 1732 01/27/14 2300 01/28/14 0415 01/28/14 0833  HGB 13.0  --   --  12.6*  --   HCT 38.1*  --   --  36.8*  --   PLT 184  --   --  194  --   APTT  --  31  --   --   --   LABPROT  --  13.1 14.2  --   --   INR  --  1.01 1.12  --   --   HEPARINUNFRC  --   --   --   --  0.31  CREATININE 0.89  --   --  0.85  --   TROPONINI  --   --  <0.30 <0.30  --     Estimated Creatinine Clearance: 101.6 ml/min (by C-G formula based on Cr of 0.85).   Medical History: Past Medical History  Diagnosis Date  . Hypertension   . Diabetes mellitus without complication   . Arthritis   . CAD (coronary artery disease)     a. Pt reports "small heart attack" in 2013 at [facility], denies need for stent or CABG, does not know findings of cath.  . Pneumonia   . TIA (transient ischemic attack)     Assessment: 72 YOM admitted 01/27/2014   with diaphoresis and chest pressure for last week. 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 Patient is not currently on any anticoagulants (states he was on warfarin in 2009 when he had both of his knees replaced.) has not had any bleeding or bruising since dark tarry stools stopped ~3 weeks ago.  Events: plan for cath today  Coag: ACS, heparin level at goal, no bleeding noted   Goal of Therapy:  Heparin level 0.3-0.7 units/ml Monitor platelets by anticoagulation protocol: Yes   Plan:  1. Continue heparin 3. Follow up after cath   Thank you for allowing pharmacy to be a part  of this patients care team.  Rowe Robert Pharm.D., BCPS Clinical Pharmacist 01/28/2014 11:00 AM Pager: [phone] Phone: [phone]

## 2014-01-28 NOTE — Progress Notes (Signed)
SUBJECTIVE:  No further chest pain  OBJECTIVE:   Vitals:   Filed Vitals:   01/28/14 0800 01/28/14 0900 01/28/14 1159 01/28/14 1200  BP: 126/47 124/46  137/64  Pulse: 67 65 82 81  Temp: 98 F (36.7 C)  98.6 F (37 C)   TempSrc: Oral  Oral   Resp: 16   15  Height:      Weight:      SpO2: 99% 97% 98% 98%   I&O's:   Intake/Output Summary (Last 24 hours) at 01/28/14 1214 Last data filed at 01/28/14 1205  Gross per 24 hour  Intake 946.55 ml  Output    720 ml  Net 226.55 ml   TELEMETRY: Reviewed telemetry pt in NSR:     PHYSICAL EXAM General: Well developed, well nourished, in no acute distress Head:   Normal cephalic and atramatic  Lungs:   Clear bilaterally to auscultation. Heart:   HRRR S1 S2  No JVD.   Abdomen: abdomen soft and non-tender Msk:  Back normal,  Normal strength and tone for age. Extremities:   No edema.  3+ right radial pulse Neuro: Alert and oriented. Psych:  Normal affect, responds appropriately   LABS: Basic Metabolic Panel:  Recent Labs  01/27/14 1125 01/28/14 0415  NA 140 138  K 3.8 4.0  CL 103 101  CO2 25 25  GLUCOSE 232* 285*  BUN 12 10  CREATININE 0.89 0.85  CALCIUM 8.4 8.3*   Liver Function Tests:  Recent Labs  01/27/14 1125 01/27/14 2300  AST 17 14  ALT 19 17  ALKPHOS 97 88  BILITOT 0.4 0.4  PROT 6.1 5.6*  ALBUMIN 3.1* 2.7*   No results found for this basename: LIPASE, AMYLASE,  in the last 72 hours CBC:  Recent Labs  01/27/14 1125 01/28/14 0415  WBC 6.6 6.6  NEUTROABS 4.3  --   HGB 13.0 12.6*  HCT 38.1* 36.8*  MCV 85.2 85.4  PLT 184 194   Cardiac Enzymes:  Recent Labs  01/27/14 2300 01/28/14 0415 01/28/14 1030  TROPONINI <0.30 <0.30 <0.30   BNP: No components found with this basename: POCBNP,  D-Dimer: No results found for this basename: DDIMER,  in the last 72 hours Hemoglobin A1C: No results found for this basename: HGBA1C,  in the last 72 hours Fasting Lipid Panel:  Recent Labs   01/28/14 0415  CHOL 151  HDL 32*  LDLCALC 92  TRIG 134  CHOLHDL 4.7   Thyroid Function Tests: No results found for this basename: TSH, T4TOTAL, FREET3, T3FREE, THYROIDAB,  in the last 72 hours Anemia Panel: No results found for this basename: VITAMINB12, FOLATE, FERRITIN, TIBC, IRON, RETICCTPCT,  in the last 72 hours Coag Panel:   Lab Results  Component Value Date   INR 1.12 01/27/2014   INR 1.01 01/27/2014    RADIOLOGY: Dg Chest Port 1 View  01/27/2014   CLINICAL DATA:  Chest pain.  EXAM: PORTABLE CHEST - 1 VIEW  COMPARISON:  None.  FINDINGS: The heart size and mediastinal contours are within normal limits. Lungs show bibasilar atelectasis. The heart size is at the upper limits of normal. No overt edema, focal airspace consolidation or pleural fluid is identified. The visualized shoulder shows degenerative changes.  IMPRESSION: Borderline heart size.  Low volumes with bibasilar atelectasis.   Electronically Signed   By: Aletta Edouard M.D.   On: 01/27/2014 11:05      ASSESSMENT/ PLAN:   1) Unstable angina: Plan for cath later today.  RF for CAD.  Prior to discharge, he will need 1 month supply of diabetes meds and BP meds prior to his appt with new PCP, Dr. Etter Sjogren.    He is new to [place].   Jettie Booze., MD  01/28/2014  12:14 PM

## 2014-01-28 NOTE — Progress Notes (Signed)
Pt not coming back, belongings sent holding area by the charge nurse.

## 2014-01-28 NOTE — Progress Notes (Signed)
Care mgmt informed me that pt means IP criteria due to heparin and NTG gtt. They will be changing status. Dayna Dunn PA-C

## 2014-01-28 NOTE — Progress Notes (Addendum)
Inpatient Diabetes Program Recommendations  AACE/ADA: New Consensus Statement on Inpatient Glycemic Control (2013)  Target Ranges:  Prepandial:   less than 140 mg/dL      Peak postprandial:   less than 180 mg/dL (1-2 hours)      Critically ill patients:  140 - 180 mg/dL  Results for Edward [name], Edward [name] (MRN [id number]) as of 01/28/2014 09:38  Ref. Range 01/27/2014 23:44 01/28/2014 08:08  Glucose-Capillary Latest Range: 70-99 mg/dL 283 (H) 249 (H)    Inpatient Diabetes Program Recommendations Insulin - Basal: consider adding Lantus 25 units since patient is NPO and will not get 70/30 (first dose now)  Note: Should hold the 70/30 supper dose today if Lantus is given.  Will reassess tomorrow. This coordinator called Melina Copa PA and requested above changes.   Thank you  Raoul Pitch BSN, RN,CDE Inpatient Diabetes Coordinator [phone] (team pager)

## 2014-01-28 NOTE — Progress Notes (Signed)
TR BAND REMOVAL  LOCATION:    right radial  DEFLATED PER PROTOCOL:    yes  TIME BAND OFF / DRESSING APPLIED:    1730   SITE UPON ARRIVAL:    Level 0  SITE AFTER BAND REMOVAL:    Level 0  REVERSE ALLEN'S TEST:     positive  CIRCULATION SENSATION AND MOVEMENT:    Within Normal Limits   yes  COMMENTS:   Tolerated procedure well

## 2014-01-28 NOTE — Progress Notes (Signed)
  Echocardiogram 2D Echocardiogram has been performed.  Syrena Burges 01/28/2014, 1:15 PM

## 2014-01-28 NOTE — Progress Notes (Signed)
UR completed. Patient changed to inpatient- requiring IV NTG gtt 

## 2014-01-28 NOTE — H&P (View-Only) (Signed)
SUBJECTIVE:  No further chest pain  OBJECTIVE:   Vitals:   Filed Vitals:   01/28/14 0800 01/28/14 0900 01/28/14 1159 01/28/14 1200  BP: 126/47 124/46  137/64  Pulse: 67 65 82 81  Temp: 98 F (36.7 C)  98.6 F (37 C)   TempSrc: Oral  Oral   Resp: 16   15  Height:      Weight:      SpO2: 99% 97% 98% 98%   I&O's:   Intake/Output Summary (Last 24 hours) at 01/28/14 1214 Last data filed at 01/28/14 1205  Gross per 24 hour  Intake 946.55 ml  Output    720 ml  Net 226.55 ml   TELEMETRY: Reviewed telemetry pt in NSR:     PHYSICAL EXAM General: Well developed, well nourished, in no acute distress Head:   Normal cephalic and atramatic  Lungs:   Clear bilaterally to auscultation. Heart:   HRRR S1 S2  No JVD.   Abdomen: abdomen soft and non-tender Msk:  Back normal,  Normal strength and tone for age. Extremities:   No edema.  3+ right radial pulse Neuro: Alert and oriented. Psych:  Normal affect, responds appropriately   LABS: Basic Metabolic Panel:  Recent Labs  01/27/14 1125 01/28/14 0415  NA 140 138  K 3.8 4.0  CL 103 101  CO2 25 25  GLUCOSE 232* 285*  BUN 12 10  CREATININE 0.89 0.85  CALCIUM 8.4 8.3*   Liver Function Tests:  Recent Labs  01/27/14 1125 01/27/14 2300  AST 17 14  ALT 19 17  ALKPHOS 97 88  BILITOT 0.4 0.4  PROT 6.1 5.6*  ALBUMIN 3.1* 2.7*   No results found for this basename: LIPASE, AMYLASE,  in the last 72 hours CBC:  Recent Labs  01/27/14 1125 01/28/14 0415  WBC 6.6 6.6  NEUTROABS 4.3  --   HGB 13.0 12.6*  HCT 38.1* 36.8*  MCV 85.2 85.4  PLT 184 194   Cardiac Enzymes:  Recent Labs  01/27/14 2300 01/28/14 0415 01/28/14 1030  TROPONINI <0.30 <0.30 <0.30   BNP: No components found with this basename: POCBNP,  D-Dimer: No results found for this basename: DDIMER,  in the last 72 hours Hemoglobin A1C: No results found for this basename: HGBA1C,  in the last 72 hours Fasting Lipid Panel:  Recent Labs   01/28/14 0415  CHOL 151  HDL 32*  LDLCALC 92  TRIG 134  CHOLHDL 4.7   Thyroid Function Tests: No results found for this basename: TSH, T4TOTAL, FREET3, T3FREE, THYROIDAB,  in the last 72 hours Anemia Panel: No results found for this basename: VITAMINB12, FOLATE, FERRITIN, TIBC, IRON, RETICCTPCT,  in the last 72 hours Coag Panel:   Lab Results  Component Value Date   INR 1.12 01/27/2014   INR 1.01 01/27/2014    RADIOLOGY: Dg Chest Port 1 View  01/27/2014   CLINICAL DATA:  Chest pain.  EXAM: PORTABLE CHEST - 1 VIEW  COMPARISON:  None.  FINDINGS: The heart size and mediastinal contours are within normal limits. Lungs show bibasilar atelectasis. The heart size is at the upper limits of normal. No overt edema, focal airspace consolidation or pleural fluid is identified. The visualized shoulder shows degenerative changes.  IMPRESSION: Borderline heart size.  Low volumes with bibasilar atelectasis.   Electronically Signed   By: Aletta Edouard M.D.   On: 01/27/2014 11:05      ASSESSMENT/ PLAN:   1) Unstable angina: Plan for cath later today.  RF for CAD.  Prior to discharge, he will need 1 month supply of diabetes meds and BP meds prior to his appt with new PCP, Dr. Etter Sjogren.    He is new to [facility].   Jettie Booze., MD  01/28/2014  12:14 PM

## 2014-01-28 NOTE — CV Procedure (Signed)
     Left Heart Catheterization with Coronary Angiography  Report  Edward [name]  73 y.o.  male Mar 24, 1941  Procedure Date: 01/28/2014  Referring Physician: Irish Lack, MD Primary Cardiologist: Einar Crow, MD  INDICATIONS: Unstable angina in the setting of poorly controlled blood pressure and inability to find a primary care physician to prescribe his medications since moving to [place] from [place], 2 months ago.  PROCEDURE: 1. Left heart catheterization; 2. Coronary angiography; 3. Left ventriculography  CONSENT:  The risks, benefits, and details of the procedure were explained in detail to the patient. Risks including death, stroke, heart attack, kidney injury, allergy, limb ischemia, bleeding and radiation injury were discussed.  The patient verbalized understanding and wanted to proceed.  Informed written consent was obtained.  PROCEDURE TECHNIQUE:  After Xylocaine anesthesia a 5 French slender sheath was placed in the right radial artery with an Angiocath and the modified Seldinger technique wall stick.  Coronary angiography was done using  5 [place] JR 4 and JL 3.5 cm diagnostic catheters.  Left ventriculography was done using the 5 [place] JR 4 catheter and hand injection.   Patient tolerated the procedure without complications.  The wrist band was applied with good hemostasis before leaving the cath lab  CONTRAST:  Total of 90 cc.  COMPLICATIONS:  None   HEMODYNAMICS:  Aortic pressure 172/66 mmHg; LV pressure 182/4 mmHg; LVEDP 14 mm mercury  ANGIOGRAPHIC DATA:   The left main coronary artery is widely patent.  The left anterior descending artery is large and contains a segmental somewhat eccentric 50% mid vessel stenosis. The LAD wraps around the left ventricular apex. No significant obstruction is felt to be present. Two diagonals arise from the LAD and also free of any significant obstruction.  The left circumflex artery is gives origin to one dominant branching  obtuse marginal. He contains luminal irregularities up to 30-40%. The continuation of the circumflex gives origin to the left atrial recurrent branch no significant obstructions noted..  The right coronary artery is dominant, giving origin to the PDA. No significant obstruction is seen.Marland Kitchen   LEFT VENTRICULOGRAM:  Left ventricular angiogram was done in the 30 RAO projection and revealed normal contractility with an EF of 65%.   IMPRESSIONS:  1. Moderate mid LAD disease without significant obstruction. 2. Widely patent circumflex and right coronary 3. Normal LV function   RECOMMENDATION:  Aggressive Medical therapy to include blood pressure control, lipid control, and diabetes. Prior to discharge the patient needs to be set up to have a primary care physician neck and help with these measures.Marland Kitchen

## 2014-01-28 NOTE — Progress Notes (Signed)
Was called by diabetes coordinator to further discuss insulin dosing. Pt is on 70/30 mix at home which she said was not ideal for situation he is in being NPO. Since sugars are running high today yet NPO for cath, she recommended a one time dose of Lantus 25 units this morning and then reassess tomorrow for further insulin needs. Dayna Dunn PA-C

## 2014-01-29 ENCOUNTER — Encounter (HOSPITAL_COMMUNITY): Payer: Self-pay | Admitting: Cardiology

## 2014-01-29 DIAGNOSIS — E119 Type 2 diabetes mellitus without complications: Secondary | ICD-10-CM

## 2014-01-29 DIAGNOSIS — Z9861 Coronary angioplasty status: Secondary | ICD-10-CM

## 2014-01-29 DIAGNOSIS — I200 Unstable angina: Secondary | ICD-10-CM | POA: Diagnosis not present

## 2014-01-29 DIAGNOSIS — I2510 Atherosclerotic heart disease of native coronary artery without angina pectoris: Secondary | ICD-10-CM

## 2014-01-29 DIAGNOSIS — I10 Essential (primary) hypertension: Secondary | ICD-10-CM | POA: Diagnosis present

## 2014-01-29 DIAGNOSIS — E785 Hyperlipidemia, unspecified: Secondary | ICD-10-CM

## 2014-01-29 HISTORY — DX: Hyperlipidemia, unspecified: E78.5

## 2014-01-29 HISTORY — DX: Type 2 diabetes mellitus without complications: E11.9

## 2014-01-29 LAB — GLUCOSE, CAPILLARY: Glucose-Capillary: 218 mg/dL — ABNORMAL HIGH (ref 70–99)

## 2014-01-29 MED ORDER — ATORVASTATIN CALCIUM 40 MG PO TABS: 40.0000 mg | ORAL_TABLET | Freq: Every day | ORAL | Status: DC

## 2014-01-29 MED ORDER — PANTOPRAZOLE SODIUM 40 MG PO TBEC: 40.0000 mg | DELAYED_RELEASE_TABLET | Freq: Every day | ORAL | Status: DC

## 2014-01-29 MED ORDER — EPLERENONE 25 MG PO TABS: 25.0000 mg | ORAL_TABLET | Freq: Every day | ORAL | Status: DC

## 2014-01-29 MED ORDER — NITROGLYCERIN 0.4 MG SL SUBL: 0.4000 mg | SUBLINGUAL_TABLET | SUBLINGUAL | Status: DC | PRN

## 2014-01-29 MED ORDER — CLONAZEPAM 2 MG PO TABS: 2.0000 mg | ORAL_TABLET | Freq: Every evening | ORAL | Status: DC | PRN

## 2014-01-29 MED ORDER — INSULIN SYRINGES (DISPOSABLE) U-100 1 ML MISC: 60.0000 [IU] | Freq: Four times a day (QID) | Status: DC

## 2014-01-29 MED ORDER — ONETOUCH ULTRASOFT LANCETS MISC: Status: DC

## 2014-01-29 MED ORDER — LIVING WELL WITH DIABETES BOOK
Freq: Once | Status: DC
  Filled 2014-01-29: qty 1

## 2014-01-29 MED ORDER — INSULIN ASPART 100 UNIT/ML ~~LOC~~ SOLN: 10.0000 [IU] | Freq: Two times a day (BID) | SUBCUTANEOUS | Status: DC

## 2014-01-29 MED ORDER — INSULIN ASPART PROT & ASPART (70-30 MIX) 100 UNIT/ML ~~LOC~~ SUSP: 60.0000 [IU] | Freq: Two times a day (BID) | SUBCUTANEOUS | Status: DC

## 2014-01-29 MED ORDER — AMLODIPINE BESYLATE 10 MG PO TABS: 10.0000 mg | ORAL_TABLET | Freq: Every day | ORAL | Status: DC

## 2014-01-29 MED ORDER — CLONAZEPAM 1 MG PO TABS: 1.0000 mg | ORAL_TABLET | Freq: Every evening | ORAL | Status: DC | PRN

## 2014-01-29 MED ORDER — ACETAMINOPHEN 325 MG PO TABS
650.0000 mg | ORAL_TABLET | Freq: Four times a day (QID) | ORAL | Status: DC | PRN
  Administered 2014-01-29: 650 mg via ORAL

## 2014-01-29 MED ORDER — ACETAMINOPHEN 325 MG PO TABS: 650.0000 mg | ORAL_TABLET | Freq: Four times a day (QID) | ORAL | Status: DC | PRN

## 2014-01-29 MED ORDER — CLONAZEPAM 2 MG PO TABS: 1.0000 mg | ORAL_TABLET | Freq: Every evening | ORAL | Status: DC | PRN

## 2014-01-29 MED ORDER — GLUCOSE BLOOD VI STRP: ORAL_STRIP | Status: DC

## 2014-01-29 MED ORDER — LISINOPRIL 10 MG PO TABS
10.0000 mg | ORAL_TABLET | Freq: Every day | ORAL | Status: DC
  Administered 2014-01-29: 09:00:00 10 mg via ORAL
  Filled 2014-01-29: qty 1

## 2014-01-29 MED ORDER — METOPROLOL TARTRATE 50 MG PO TABS: 50.0000 mg | ORAL_TABLET | Freq: Two times a day (BID) | ORAL | Status: DC

## 2014-01-29 MED ORDER — LISINOPRIL 10 MG PO TABS: 10.0000 mg | ORAL_TABLET | Freq: Every day | ORAL | Status: DC

## 2014-01-29 NOTE — Progress Notes (Signed)
Inpatient Diabetes Program Recommendations  AACE/ADA: New Consensus Statement on Inpatient Glycemic Control (2013)  Target Ranges:  Prepandial:   less than 140 mg/dL      Peak postprandial:   less than 180 mg/dL (1-2 hours)      Critically ill patients:  140 - 180 mg/dL     Results for Edward [name], Edward [name] (MRN [id number]) as of 01/29/2014 09:19  Ref. Range 01/28/2014 08:08 01/28/2014 12:02 01/28/2014 15:12 01/28/2014 18:06 01/28/2014 21:33  Glucose-Capillary Latest Range: 70-99 mg/dL 249 (H) 265 (H) 180 (H) 336 (H) 330 (H)    Results for Edward [name], Edward [name] (MRN [id number]) as of 01/29/2014 09:19  Ref. Range 01/29/2014 08:21  Glucose-Capillary Latest Range: 70-99 mg/dL 218 (H)     **Patient was NPO yesterday for cardiac cath.  70/30 insulin was d/c'd as a result.  Patient did receive 25 units Lantus X 1 dose last night at bedtime.  **Needs care management consult to assist patient with finding PCP at d/c to help pt manage DM at home   MD- Please add back at least 1/2 of patient's home dose of 70/30 insulin (patient takes 60 units bid with meals at home)   Will follow. Wyn Quaker RN, MSN, CDE Diabetes Coordinator Inpatient Diabetes Program Team Pager: [phone] (8a-10p)

## 2014-01-29 NOTE — Discharge Instructions (Signed)
Call [facility] at [phone] if any bleeding, swelling or drainage at cath site.  May shower, no tub baths for 48 hours for groin sticks.   We are arranging a Primary Physician for you, the case manager should notify you.  Take 1 NTG, under your tongue, while sitting.  If no relief of pain may repeat NTG, one tab every 5 minutes up to 3 tablets total over 15 minutes.  If no relief CALL 911.  If you have dizziness/lightheadness  while taking NTG, stop taking and call 911.    Heart Healthy Diabetic Diet.

## 2014-01-29 NOTE — Clinical Documentation Improvement (Signed)
Possible Clinical conditions  Morbid Obesity W/ BMI=41.24 Underweight w/BMI Other condition Cannot clinically determine  Supporting Information: Weight: 271 lb 2.7 oz (123 kg) "  Height: 5\' 8"  (1.727 m)   BMI: 41.24 kg/m  Thank You, Alessandra Grout, RN, BSN, CCDS, Clinical Documentation Specialist:  [phone]   Cell: [facility] Information Management

## 2014-01-29 NOTE — Discharge Summary (Signed)
Physician Discharge Summary       Patient ID: Edward [name] MRN: [id number] DOB/AGE: [birth year] 72 y.o.  Admit date: 01/27/2014 Discharge date: 01/29/2014  Discharge Diagnoses:  Principal Problem:   Unstable angina Active Problems:   CAD (coronary artery disease), moderate mid LAD disease    Dyslipidemia, goal LDL below 70   DM type 2 (diabetes mellitus, type 2), insulin dependent   HTN (hypertension)   Discharged Condition: good  Primary Cardiologist:  New Dr. Tamala Julian  Procedures: 01/28/14 cardiac cath by Dr. Laurena Bering Course: Edward y/o [name] who moved here in February from [place] with history of HTN, DM, and "small heart attack" in 2013 without need for stent or CABG by cath. Edward [name] is unsure of the cath results. Edward [name] is still trying to establish primary care here and recently ran out of his Norvasc 1 week ago along with difficulty obtaining his insulin. Over the past few weeks Edward [name] has noticed that upon exertion such as walking or sweeping Edward [name] gets diaphoresis and chest pressure that resolves with rest. 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 01/27/14 around 4 am Edward [name] awoke with substernal chest pressure like an elephant sitting on his chest. This was associated with SOB and diaphoresis, perhaps increased awareness of heart palpitations. No nausea, vomiting, syncope. Edward [name] took a baby ASA and continued to rest. At 8:30am when pain did not improve Edward [name] pressed his LifeAlert button and EMS was called. Edward [name] received 3 additional ASA and SL NTG. Presenting BP was 210/100. Edward [name] was given fentanyl, SL NTG, and then NTG gtt but pressure bottomed to 85/50. It has since come back to the 150-160 range. The pain is much improved but Edward [name] can still slightly feel a reminder of it. Edward [name] endorses trace LEE. Troponin neg x 1. Edward [name] says Edward [name] was admitted in [place] in 10/2013 for chest pain and was diagnosed with a pulled muscle. Edward [name]  denies cardiac stress test or cath at that time.   Edward [name] also says that once a week since moving here in February Edward [name] had dark tarry stools. No frank blood. This happened once a week up until about 3 weeks ago. Edward [name] has since discontinued Advil which Edward [name] was taking for R shoulder pain and has had no further episodes.  Edward [name] was admitted and placed on IV Heparin and IV NTG.  Statin started, BB and ACE as well.  PPI was added as well.  tropnins were negative and Edward [name] under went cardiac cath 01/28/14.  Results: IMPRESSIONS: 1. Moderate mid LAD disease without significant obstruction.  2. Widely patent circumflex and right coronary  3. Normal LV function  RECOMMENDATION: Aggressive Medical therapy to include blood pressure control, lipid control, and diabetes.    By the next AM Edward [name] was seen and examined by Dr. Martinique and found to be stable for discharge home.  We prescribed insulin and syringes, accucheck equipment for one touch ultra.  Edward [name] will follow up with APP in office then Dr. Tamala Julian.  Consults: None  Significant Diagnostic Studies:  BMET    Component Value Date/Time   NA 138 01/28/2014 0415   K 4.0 01/28/2014 0415   CL 101 01/28/2014 0415   CO2 25 01/28/2014 0415   GLUCOSE 285* 01/28/2014 0415   BUN 10 01/28/2014 0415   CREATININE 0.85 01/28/2014 0415   CALCIUM 8.3* 01/28/2014 0415   GFRNONAA 86* 01/28/2014 0415  GFRAA >90 01/28/2014 0415    CBC    Component Value Date/Time   WBC 6.6 01/28/2014 0415   RBC 4.31 01/28/2014 0415   HGB 12.6* 01/28/2014 0415   HCT 36.8* 01/28/2014 0415   PLT 194 01/28/2014 0415   MCV 85.4 01/28/2014 0415   MCH 29.2 01/28/2014 0415   MCHC 34.2 01/28/2014 0415   RDW 12.9 01/28/2014 0415   LYMPHSABS 1.6 01/27/2014 1125   MONOABS 0.5 01/27/2014 1125   EOSABS 0.2 01/27/2014 1125   BASOSABS 0.0 01/27/2014 1125    T CHol 151, TG 134, HDL 32, LDL 92  HgbA1C 9.1 TSH 1.370   2D Echo: - Left ventricle: The cavity size was normal. There was severe concentric hypertrophy (1.6-1.7  cm) - consider global variant HCM. Systolic function was normal. The estimated ejection fraction was in the range of 60% to 65%. Wall motion was normal; there were no regional wall motion abnormalities. Doppler parameters are consistent with abnormal left ventricular relaxation (grade 1 diastolic dysfunction). The E/e' ratio is <10, suggesting normal LV filling pressure. - Left atrium: The atrium was normal in size. - Right ventricle: The cavity size was normal. There is borderline RVH. Systolic function was normal. - Right atrium: The atrium was at the upper limits of normal in size. - Pericardium, extracardiac: There was no pericardial effusion.  PCXR: COMPARISON: None. FINDINGS: The heart size and mediastinal contours are within normal limits. Lungs show bibasilar atelectasis. The heart size is at the upper limits of normal. No overt edema, focal airspace consolidation or pleural fluid is identified. The visualized shoulder shows degenerative changes. IMPRESSION: Borderline heart size. Low volumes with bibasilar atelectasis.     Discharge Exam: Blood pressure 166/61, pulse 83, temperature 97.4 F (36.3 C), temperature source Oral, resp. rate 18, height 5\' 8"  (1.727 [name]), weight 271 lb 2.7 oz (123 kg), SpO2 97.00%.  Disposition: 01-Home or Self Care       Future Appointments Provider Department Dept Phone   02/11/2014 1:30 PM Rosalita Chessman, [place] at  [place]   02/12/2014 12:15 PM Imogene Burn, PA-C [facility] [phone]       Medication List         acetaminophen 325 MG tablet  Commonly known as:  TYLENOL  Take 2 tablets (650 mg total) by mouth every 6 (six) hours as needed for moderate pain.     amLODipine 10 MG tablet  Commonly known as:  NORVASC  Take 1 tablet (10 mg total) by mouth daily.     aspirin EC 81 MG tablet  Take 81 mg by mouth daily.     atorvastatin 40 MG tablet  Commonly known  as:  LIPITOR  Take 1 tablet (40 mg total) by mouth daily at 6 PM.     clonazePAM 2 MG tablet  Commonly known as:  KLONOPIN  Take 0.5 tablets (1 mg total) by mouth at bedtime as needed for anxiety.     eplerenone 25 MG tablet  Commonly known as:  INSPRA  Take 1 tablet (25 mg total) by mouth daily.     glucose blood test strip  Commonly known as:  BL TEST STRIP PACK  Use as instructed     insulin aspart 100 UNIT/ML injection  Commonly known as:  novoLOG  Inject 10-20 Units into the skin 2 (two) times daily.     insulin aspart protamine- aspart (70-30) 100 UNIT/ML injection  Commonly known as:  NOVOLOG  MIX 70/30  Inject 0.6 mLs (60 Units total) into the skin 2 (two) times daily with a meal.     Insulin Syringes (Disposable) U-100 1 ML Misc  60 Units by Does not apply route 4 (four) times daily.     lisinopril 10 MG tablet  Commonly known as:  PRINIVIL,ZESTRIL  Take 1 tablet (10 mg total) by mouth daily.     metoprolol 50 MG tablet  Commonly known as:  LOPRESSOR  Take 1 tablet (50 mg total) by mouth 2 (two) times daily.     nitroGLYCERIN 0.4 MG SL tablet  Commonly known as:  NITROSTAT  Place 1 tablet (0.4 mg total) under the tongue every 5 (five) minutes as needed for chest pain (CP or SOB).     onetouch ultrasoft lancets  Use as instructed     pantoprazole 40 MG tablet  Commonly known as:  PROTONIX  Take 1 tablet (40 mg total) by mouth daily at 6 (six) AM.       Follow-up Information   Follow up with Sinclair Grooms, MD On 02/12/2014. (at 12:15 pm  wtih Margarite Gouge, PA-C, )    Specialty:  Cardiology   Contact information:   [address] [address] [phone]        Discharge Instructions: Call [facility] at [phone] if any bleeding, swelling or drainage at cath site.  May shower, no tub baths for 48 hours for groin sticks.   We are arranging a Primary Physician for you, the case manager should  notify you.  Take 1 NTG, under your tongue, while sitting.  If no relief of pain may repeat NTG, one tab every 5 minutes up to 3 tablets total over 15 minutes.  If no relief CALL 911.  If you have dizziness/lightheadness  while taking NTG, stop taking and call 911.    Heart Healthy Diabetic Diet.        Signed: Isaiah Serge Nurse Practitioner-Certified [facility]: HEARTCARE 01/29/2014, 10:36 AM  Time spent on discharge :40 minutes.   Patient seen and examined and history reviewed. Agree with above findings and plan. No complications post cath procedure. Right wrist is without hematoma. BP is still high. Will add ACEI. Need aggressive risk factor modification. Will arrange follow up with primary care.   Collier Salina [facility] 01/29/2014 11:03 AM

## 2014-02-05 DIAGNOSIS — M67919 Unspecified disorder of synovium and tendon, unspecified shoulder: Secondary | ICD-10-CM | POA: Diagnosis not present

## 2014-02-05 DIAGNOSIS — M25569 Pain in unspecified knee: Secondary | ICD-10-CM | POA: Diagnosis not present

## 2014-02-05 DIAGNOSIS — M719 Bursopathy, unspecified: Secondary | ICD-10-CM | POA: Diagnosis not present

## 2014-02-10 ENCOUNTER — Telehealth: Payer: Self-pay

## 2014-02-10 DIAGNOSIS — E1165 Type 2 diabetes mellitus with hyperglycemia: Principal | ICD-10-CM

## 2014-02-10 DIAGNOSIS — N400 Enlarged prostate without lower urinary tract symptoms: Secondary | ICD-10-CM

## 2014-02-10 DIAGNOSIS — IMO0001 Reserved for inherently not codable concepts without codable children: Secondary | ICD-10-CM

## 2014-02-10 NOTE — Telephone Encounter (Signed)
Left message for call back Non-identifiable   Flu- 07/31/2013 Td- 01/01/08 PNA-DUE Z-DUE CCS-DUE PSA- not on file

## 2014-02-10 NOTE — Addendum Note (Signed)
Addended by: Rudene Anda on: 02/10/2014 03:48 PM   Modules accepted: Orders

## 2014-02-10 NOTE — Telephone Encounter (Signed)
Medication List and allergies:  Updated and Reviewed  90 day supply/mail order: n/a Local prescriptions:  [facility], [place] - [address]  Immunization due:  PNA- declined, Zoster- declined  A/P: Personal, family and PSH: Reviewed and updated Flu- 07/31/2013 Tdap- 01/01/08 PNA- declined Shingles- declined CCS- DUE PSA- not on file, however patient states that he has an enlarged prostate.    Patient to bring in pertinent medical records with him during his office visit.     To discuss with provider: Blood sugars have been running in the 300's Requesting referrals for endocrinologist, urologist, podiatrist, etc.----Dr. Etter Sjogren was made aware. Verbal order was given to place referrals.

## 2014-02-11 ENCOUNTER — Ambulatory Visit (INDEPENDENT_AMBULATORY_CARE_PROVIDER_SITE_OTHER): Payer: Medicare Other | Admitting: Family Medicine

## 2014-02-11 ENCOUNTER — Encounter: Payer: Self-pay | Admitting: Family Medicine

## 2014-02-11 VITALS — BP 156/62 | HR 80 | Temp 98.2°F | Ht 67.5 in | Wt 263.2 lb

## 2014-02-11 DIAGNOSIS — I10 Essential (primary) hypertension: Secondary | ICD-10-CM | POA: Diagnosis not present

## 2014-02-11 DIAGNOSIS — I200 Unstable angina: Secondary | ICD-10-CM | POA: Diagnosis not present

## 2014-02-11 DIAGNOSIS — E785 Hyperlipidemia, unspecified: Secondary | ICD-10-CM

## 2014-02-11 DIAGNOSIS — E1142 Type 2 diabetes mellitus with diabetic polyneuropathy: Secondary | ICD-10-CM

## 2014-02-11 DIAGNOSIS — E1159 Type 2 diabetes mellitus with other circulatory complications: Secondary | ICD-10-CM | POA: Diagnosis not present

## 2014-02-11 DIAGNOSIS — N400 Enlarged prostate without lower urinary tract symptoms: Secondary | ICD-10-CM | POA: Diagnosis not present

## 2014-02-11 DIAGNOSIS — R319 Hematuria, unspecified: Secondary | ICD-10-CM | POA: Diagnosis not present

## 2014-02-11 DIAGNOSIS — E1140 Type 2 diabetes mellitus with diabetic neuropathy, unspecified: Secondary | ICD-10-CM

## 2014-02-11 DIAGNOSIS — E1149 Type 2 diabetes mellitus with other diabetic neurological complication: Secondary | ICD-10-CM

## 2014-02-11 LAB — POCT URINALYSIS DIPSTICK
Bilirubin, UA: NEGATIVE
Glucose, UA: NEGATIVE
KETONES UA: NEGATIVE
Leukocytes, UA: NEGATIVE
Nitrite, UA: NEGATIVE
PH UA: 6
Protein, UA: 300
Spec Grav, UA: 1.025
Urobilinogen, UA: 0.2

## 2014-02-11 LAB — MICROALBUMIN / CREATININE URINE RATIO
Creatinine,U: 244.6 mg/dL
MICROALB/CREAT RATIO: 37.6 mg/g — AB (ref 0.0–30.0)
Microalb, Ur: 91.9 mg/dL — ABNORMAL HIGH (ref 0.0–1.9)

## 2014-02-11 LAB — PSA: PSA: 0.74 ng/mL (ref 0.10–4.00)

## 2014-02-11 MED ORDER — GABAPENTIN 100 MG PO CAPS: ORAL_CAPSULE | ORAL | Status: DC

## 2014-02-11 NOTE — Patient Instructions (Signed)
Diabetes and Standards of Medical Care  Diabetes is complicated. You may find that your diabetes team includes a dietitian, nurse, diabetes educator, eye doctor, and more. To help everyone know what is going on and to help you get the care you deserve, the following schedule of care was developed to help keep you on track. Below are the tests, exams, vaccines, medicines, education, and plans you will need. HbA1c test This test shows how well you have controlled your glucose over the past 2 3 months. It is used to see if your diabetes management plan needs to be adjusted.   It is performed at least 2 times a year if you are meeting treatment goals.  It is performed 4 times a year if therapy has changed or if you are not meeting treatment goals. Blood pressure test  This test is performed at every routine medical visit. The goal is less than 140/90 mmHg for most people, but 130/80 mmHg in some cases. Ask your health care provider about your goal. Dental exam  Follow up with the dentist regularly. Eye exam  If you are diagnosed with type 1 diabetes as a child, get an exam upon reaching the age of 56 years or older and have had diabetes for 3 5 years. Yearly eye exams are recommended after that initial eye exam.  If you are diagnosed with type 1 diabetes as an adult, get an exam within 5 years of diagnosis and then yearly.  If you are diagnosed with type 2 diabetes, get an exam as soon as possible after the diagnosis and then yearly. Foot care exam  Visual foot exams are performed at every routine medical visit. The exams check for cuts, injuries, or other problems with the feet.  A comprehensive foot exam should be done yearly. This includes visual inspection as well as assessing foot pulses and testing for loss of sensation.  Check your feet nightly for cuts, injuries, or other problems with your feet. Tell your health care provider if anything is not healing. Kidney function test (urine  microalbumin)  This test is performed once a year.  Type 1 diabetes: The first test is performed 5 years after diagnosis.  Type 2 diabetes: The first test is performed at the time of diagnosis.  A serum creatinine and estimated glomerular filtration rate (eGFR) test is done once a year to assess the level of chronic kidney disease (CKD), if present. Lipid profile (cholesterol, HDL, LDL, triglycerides)  Performed every 5 years for most people.  The goal for LDL is less than 100 mg/dL. If you are at high risk, the goal is less than 70 mg/dL.  The goal for HDL is 40 mg/dL 50 mg/dL for men and 50 mg/dL 60 mg/dL for women. An HDL cholesterol of 60 mg/dL or higher gives some protection against heart disease.  The goal for triglycerides is less than 150 mg/dL. Influenza vaccine, pneumococcal vaccine, and hepatitis B vaccine  The influenza vaccine is recommended yearly.  The pneumococcal vaccine is generally given once in a lifetime. However, there are some instances when another vaccination is recommended. Check with your health care provider.  The hepatitis B vaccine is also recommended for adults with diabetes. Diabetes self-management education  Education is recommended at diagnosis and ongoing as needed. Treatment plan  Your treatment plan is reviewed at every medical visit. Document Released: 08/14/2009 Document Revised: 06/19/2013 Document Reviewed: 03/19/2013 [facility] Patient Information 2014 [place].

## 2014-02-11 NOTE — Progress Notes (Signed)
Subjective:    Patient ID: Edward [name], male    DOB: [birth year], 73 y.o.   MRN: [id number]  HPI  HPI HYPERTENSION  Blood pressure range-not checking  Chest pain- no      Dyspnea- no Lightheadedness- no   Edema- no Other side effects - no   Medication compliance: good Low salt diet- yes  DIABETES  Blood Sugar ranges-62-380  Polyuria- no New Visual problems- no Hypoglycemic symptoms- yes Other side effects-no Medication compliance - poor--has not been able to get med Last eye exam- due Foot exam- today  HYPERLIPIDEMIA  Medication compliance- poor--ran out of meds  RUQ pain- no  Muscle aches- no Other side effects-no  ROS See HPI above   Past Medical History  Diagnosis Date  . Hypertension   . Arthritis   . CAD (coronary artery disease)     a. Pt reports "small heart attack" in 2013 at [facility], denies need for stent or CABG, does not know findings of cath.  . Pneumonia   . TIA (transient ischemic attack)   . Dyslipidemia, goal LDL below 70 01/29/2014  . DM type 2 (diabetes mellitus, type 2), insulin dependent 01/29/2014  . Heart attack 02/03/2014    "mild heart attack"  . GERD (gastroesophageal reflux disease)   . Enlarged prostate    Family History  Problem Relation Age of Onset  . CAD Brother     2 brothers - CABG  . Alzheimer's disease Sister   . CAD Sister   . Prostate cancer Brother   . Diabetes     Current Outpatient Prescriptions  Medication Sig Dispense Refill  . acetaminophen (TYLENOL) 325 MG tablet Take 2 tablets (650 mg total) by mouth every 6 (six) hours as needed for moderate pain.      Marland Kitchen amLODipine (NORVASC) 10 MG tablet Take 1 tablet (10 mg total) by mouth daily.  30 tablet  6  . aspirin EC 81 MG tablet Take 81 mg by mouth daily.      . clonazePAM (KLONOPIN) 1 MG tablet Take 1 tablet (1 mg total) by mouth at bedtime as needed for anxiety.  30 tablet  0  . eplerenone (INSPRA) 25 MG tablet Take 1 tablet (25 mg total) by  mouth daily.  30 tablet  0  . glucose blood (BL TEST STRIP PACK) test strip Use as instructed  100 each  2  . insulin aspart (NOVOLOG) 100 UNIT/ML injection Inject 10-20 Units into the skin 2 (two) times daily.  10 mL  1  . insulin aspart protamine- aspart (NOVOLOG MIX 70/30) (70-30) 100 UNIT/ML injection Inject 0.6 mLs (60 Units total) into the skin 2 (two) times daily with a meal.  10 mL  1  . Insulin Syringes, Disposable, U-100 1 ML MISC 60 Units by Does not apply route 4 (four) times daily.  120 each  2  . Lancets (ONETOUCH ULTRASOFT) lancets Use as instructed  100 each  12  . lisinopril (PRINIVIL,ZESTRIL) 10 MG tablet Take 1 tablet (10 mg total) by mouth daily.  30 tablet  6  . metoprolol (LOPRESSOR) 50 MG tablet Take 1 tablet (50 mg total) by mouth 2 (two) times daily.  60 tablet  6  . nitroGLYCERIN (NITROSTAT) 0.4 MG SL tablet Place 1 tablet (0.4 mg total) under the tongue every 5 (five) minutes as needed for chest pain (CP or SOB).  25 tablet  4  . pantoprazole (PROTONIX) 40 MG tablet Take 1 tablet (40  mg total) by mouth daily at 6 (six) AM.  30 tablet  2  . gabapentin (NEURONTIN) 100 MG capsule 1 po tid for 1 week can slowly increase it to 300 mg tid  120 capsule  2   No current facility-administered medications for this visit.   History   Social History  . Marital Status: Married    Spouse Name: N/A    Number of Children: N/A  . Years of Education: N/A   Occupational History  . Not on file.   Social History Main Topics  . Smoking status: Never Smoker   . Smokeless tobacco: Not on file  . Alcohol Use: No  . Drug Use: No  . Sexual Activity: Not on file   Other Topics Concern  . Not on file   Social History Narrative  . No narrative on file   No Known Allergies       Review of Systems As above    Objective:   Physical Exam  BP 156/62  Pulse 80  Temp(Src) 98.2 F (36.8 C) (Oral)  Ht 5' 7.5" (1.715 m)  Wt 263 lb 3.2 oz (119.387 kg)  BMI 40.59 kg/m2  SpO2  97% General appearance: alert, cooperative, appears stated age and no distress Neck: no adenopathy, supple, symmetrical, trachea midline and thyroid not enlarged, symmetric, no tenderness/mass/nodules Lungs: clear to auscultation bilaterally Heart: S1, S2 normal--+ murmur 2-3/6 Extremities: extremities normal, atraumatic, no cyanosis or edema Sensory exam of the foot is normal L foot---decreased sensation R foot, tested with the monofilament. Good pulses, no lesions or ulcers, good peripheral pulses.       Assessment & Plan:  1. BPH (benign prostatic hypertrophy) Referral to urology put in - PSA  2. Type II or unspecified type diabetes mellitus with peripheral circulatory disorders, uncontrolled(250.72) Pt requested endo--- we had no samples of 70/30--- pt was given 75/25 until we can get appointment to endo-- pt can not afford insulin until end of month - POCT urinalysis dipstick - Microalbumin / creatinine urine ratio  3. HTN (hypertension)/ CAD Stable--per cards  4. Other and unspecified hyperlipidemia con't meds Labs done in hospital   5. Diabetic neuropathy New per pt--- pt has appointment with podiatry and neuro pending - gabapentin (NEURONTIN) 100 MG capsule; 1 po tid for 1 week can slowly increase it to 300 mg tid  Dispense: 120 capsule; Refill: 2

## 2014-02-11 NOTE — Addendum Note (Signed)
Addended by: Modena Morrow D on: 02/11/2014 04:59 PM   Modules accepted: Orders

## 2014-02-11 NOTE — Progress Notes (Signed)
Pre visit review using our clinic review tool, if applicable. No additional management support is needed unless otherwise documented below in the visit note. 

## 2014-02-12 ENCOUNTER — Other Ambulatory Visit: Payer: Self-pay | Admitting: Cardiology

## 2014-02-12 ENCOUNTER — Telehealth: Payer: Self-pay | Admitting: Family Medicine

## 2014-02-12 ENCOUNTER — Encounter: Payer: Federal, State, Local not specified - PPO | Admitting: Physician Assistant

## 2014-02-12 NOTE — Telephone Encounter (Signed)
Relevant patient education assigned to patient using Emmi. ° °

## 2014-02-13 ENCOUNTER — Other Ambulatory Visit: Payer: Self-pay

## 2014-02-13 ENCOUNTER — Encounter: Payer: Self-pay | Admitting: Gastroenterology

## 2014-02-13 DIAGNOSIS — K219 Gastro-esophageal reflux disease without esophagitis: Secondary | ICD-10-CM

## 2014-02-13 DIAGNOSIS — G608 Other hereditary and idiopathic neuropathies: Secondary | ICD-10-CM

## 2014-02-13 DIAGNOSIS — G629 Polyneuropathy, unspecified: Secondary | ICD-10-CM

## 2014-02-13 DIAGNOSIS — E1110 Type 2 diabetes mellitus with ketoacidosis without coma: Secondary | ICD-10-CM

## 2014-02-13 LAB — URINE CULTURE
Colony Count: NO GROWTH
ORGANISM ID, BACTERIA: NO GROWTH

## 2014-02-21 ENCOUNTER — Ambulatory Visit (INDEPENDENT_AMBULATORY_CARE_PROVIDER_SITE_OTHER): Payer: Medicare Other | Admitting: Internal Medicine

## 2014-02-21 ENCOUNTER — Encounter: Payer: Self-pay | Admitting: Internal Medicine

## 2014-02-21 ENCOUNTER — Other Ambulatory Visit: Payer: Self-pay | Admitting: *Deleted

## 2014-02-21 VITALS — BP 118/72 | HR 68 | Temp 97.9°F | Resp 12 | Ht 68.0 in | Wt 271.0 lb

## 2014-02-21 DIAGNOSIS — I200 Unstable angina: Secondary | ICD-10-CM | POA: Diagnosis not present

## 2014-02-21 DIAGNOSIS — E119 Type 2 diabetes mellitus without complications: Secondary | ICD-10-CM

## 2014-02-21 MED ORDER — INSULIN DETEMIR 100 UNIT/ML ~~LOC~~ SOLN: 75.0000 [IU] | Freq: Every day | SUBCUTANEOUS | Status: DC

## 2014-02-21 MED ORDER — GLUCOSE BLOOD VI STRP: ORAL_STRIP | Status: DC

## 2014-02-21 MED ORDER — ONETOUCH ULTRA SYSTEM W/DEVICE KIT: PACK | Status: DC

## 2014-02-21 MED ORDER — ONETOUCH ULTRASOFT LANCETS MISC: Status: DC

## 2014-02-21 MED ORDER — INSULIN ASPART 100 UNIT/ML ~~LOC~~ SOLN: SUBCUTANEOUS | Status: DC

## 2014-02-21 NOTE — Patient Instructions (Addendum)
Please return in 1 month with your sugar log.  Please stop the 70/30 insulin. Start Levemir 75 units at bedtime. Increase NovoLog to: - 30 units with breakfast - 20 units with lunch - 30 units with dinner Please inject NovoLog 15 min before starting eating. When injecting insulin:  Inject in the abdomen  Rotate the injection sites around the belly button  Change needle for each injection  Keep needle in for 10 sec after last unit of insulin in  Keep the insulin in use out of the fridge  PATIENT INSTRUCTIONS FOR TYPE 2 DIABETES:  DIET AND EXERCISE Diet and exercise is an important part of diabetic treatment.  We recommended aerobic exercise in the form of brisk walking (working between 40-60% of maximal aerobic capacity, similar to brisk walking) for 150 minutes per week (such as 30 minutes five days per week) along with 3 times per week performing 'resistance' training (using various gauge rubber tubes with handles) 5-10 exercises involving the major muscle groups (upper body, lower body and core) performing 10-15 repetitions (or near fatigue) each exercise. Start at half the above goal but build slowly to reach the above goals. If limited by weight, joint pain, or disability, we recommend daily walking in a swimming pool with water up to waist to reduce pressure from joints while allow for adequate exercise.    BLOOD GLUCOSES Monitoring your blood glucoses is important for continued management of your diabetes. Please check your blood glucoses 2-4 times a day: fasting, before meals and at bedtime (you can rotate these measurements - e.g. one day check before the 3 meals, the next day check before 2 of the meals and before bedtime, etc.).   HYPOGLYCEMIA (low blood sugar) Hypoglycemia is usually a reaction to not eating, exercising, or taking too much insulin/ other diabetes drugs.  Symptoms include tremors, sweating, hunger, confusion, headache, etc. Treat IMMEDIATELY with 15 grams of  Carbs:   4 glucose tablets    cup regular juice/soda   2 tablespoons raisins   4 teaspoons sugar   1 tablespoon honey Recheck blood glucose in 15 mins and repeat above if still symptomatic/blood glucose <100.  RECOMMENDATIONS TO REDUCE YOUR RISK OF DIABETIC COMPLICATIONS: * Take your prescribed MEDICATION(S) * Follow a DIABETIC diet: Complex carbs, fiber rich foods, (monounsaturated and polyunsaturated) fats * AVOID saturated/trans fats, high fat foods, >2,300 mg salt per day. * EXERCISE at least 5 times a week for 30 minutes or preferably daily.  * DO NOT SMOKE OR DRINK more than 1 drink a day. * Check your FEET every day. Do not wear tightfitting shoes. Contact us if you develop an ulcer * See your EYE doctor once a year or more if needed * Get a FLU shot once a year * Get a PNEUMONIA vaccine once before and once after age 27 years  GOALS:  * Your Hemoglobin A1c of <7%  * fasting sugars need to be <130 * after meals sugars need to be <180 (2h after you start eating) * Your Systolic BP should be 474 or lower  * Your Diastolic BP should be 80 or lower  * Your HDL (Good Cholesterol) should be 40 or higher  * Your LDL (Bad Cholesterol) should be 100 or lower. * Your Triglycerides should be 150 or lower  * Your Urine microalbumin (kidney function) should be <30 * Your Body Mass Index should be 25 or lower   We will be glad to help you achieve these goals. Our  telephone number is: [phone].

## 2014-02-21 NOTE — Progress Notes (Signed)
Patient ID: Edward [name], male   DOB: 04/13/1941, 73 y.o.   MRN: [id number]  HPI: Edward [name] is a 73 y.o.-year-old male, referred by his PCP, Dr. Etter Sjogren, for management of DM2, insulin-dependent, uncontrolled, with complications (CAD, PN). He just moved from [place] in 12/2013.  Patient has been diagnosed with diabetes in 1987; he started insulin in 1989. Last hemoglobin A1c was: Lab Results  Component Value Date   HGBA1C 9.1* 01/27/2014   Pt is on a regimen of (prefers syringes, not pens): - Novolog 70/30 units 60 units 2x a day - NovoLog 20 units 2x a day He was on Metformin >> N/V/D. He ran out of insulin for a week in 12/2013.  Pt checks his sugars 4-5 a day and they are: - am: 180-270 - 2h after b'fast: n/c - before lunch: 280-310 - 2h after lunch: n/c - before dinner: 280s - 2h after dinner: n/c - bedtime: 200-300 - nighttime: n/c Has some lows. Lowest sugar was 56, usually at nighttime; can have lows 4x a week, but not as low (110s) - he has hypoglycemia awareness at 90  Highest sugar was 300.  Needs another One Touch Ultra meter. Needs strips and lancets.  Pt's meals are: - Breakfast: eggs, oatmeal, grits, bacon - Lunch: baked chiken + vegetables - Dinner: same as lunch - Snacks: sweets - cookies Rarely fast food.  He just started walking in am and swims in the swimming pool.   - no CKD, last BUN/creatinine:  Lab Results  Component Value Date   BUN 10 01/28/2014   CREATININE 0.85 01/28/2014  On Lisinopril. ACR 37.6 in 02/11/2014) - Has HL. Last set of lipids: Lab Results  Component Value Date   CHOL 151 01/28/2014   HDL 32* 01/28/2014   LDLCALC 92 01/28/2014   TRIG 134 01/28/2014   CHOLHDL 4.7 01/28/2014  Not on a statin. He gets leg cramps from Lipitor >> cannot tolerate it. - last eye exam was in 07/2013. ? DR. Had cataracts sx. Needs an ophthalmologist. - + numbness and tingling in his feet >> will start neurontin. Needs a podiatrist. On ASA 81.  Pt has FH  of DM in 4 brother, mother, 2 sisters.  ROS: Constitutional: + weight gain, + fatigue, + hot flushes, + poor sleep, + nocturia Eyes: no blurry vision, no xerophthalmia ENT: no sore throat, no nodules palpated in throat, no dysphagia/odynophagia, no hoarseness Cardiovascular: + CP/+ SOB/no palpitations/no leg swelling Respiratory: no cough/+ SOB Gastrointestinal: + N/no V/D/C/ heartburn Musculoskeletal: + muscle aches/+ joint aches Skin: no rashes, + itching Neurological: no tremors/numbness/tingling/dizziness, + HA Psychiatric: no depression/anxiety Low libido, pbs with erections Past Medical History  Diagnosis Date  . Hypertension   . Arthritis   . CAD (coronary artery disease)     a. Pt reports "small heart attack" in 2013 at [facility], denies need for stent or CABG, does not know findings of cath.  . Pneumonia   . TIA (transient ischemic attack)   . Dyslipidemia, goal LDL below 70 01/29/2014  . DM type 2 (diabetes mellitus, type 2), insulin dependent 01/29/2014  . Heart attack 02/03/2014    "mild heart attack"  . GERD (gastroesophageal reflux disease)   . Enlarged prostate    Past Surgical History  Procedure Laterality Date  . Total knee arthroplasty    . Cardiac catheterization  01/28/14    + CAD treat medically   History   Social History  . Marital Status: Married, but  separated    Spouse Name: N/A    Number of Children: 0   Occupational History  . retired   Social History Main Topics  . Smoking status: Never Smoker   . Smokeless tobacco: Not on file  . Alcohol Use: No  . Drug Use: No    Current Outpatient Prescriptions on File Prior to Visit  Medication Sig Dispense Refill  . acetaminophen (TYLENOL) 325 MG tablet Take 2 tablets (650 mg total) by mouth every 6 (six) hours as needed for moderate pain.      Marland Kitchen amLODipine (NORVASC) 10 MG tablet Take 1 tablet (10 mg total) by mouth daily.  30 tablet  6  . aspirin EC 81 MG tablet Take 81 mg by mouth  daily.      . clonazePAM (KLONOPIN) 1 MG tablet Take 1 tablet (1 mg total) by mouth at bedtime as needed for anxiety.  30 tablet  0  . eplerenone (INSPRA) 25 MG tablet Take 1 tablet (25 mg total) by mouth daily.  30 tablet  0  . gabapentin (NEURONTIN) 100 MG capsule 1 po tid for 1 week can slowly increase it to 300 mg tid  120 capsule  2  . Insulin Syringes, Disposable, U-100 1 ML MISC 60 Units by Does not apply route 4 (four) times daily.  120 each  2  . lisinopril (PRINIVIL,ZESTRIL) 10 MG tablet Take 1 tablet (10 mg total) by mouth daily.  30 tablet  6  . metoprolol (LOPRESSOR) 50 MG tablet Take 1 tablet (50 mg total) by mouth 2 (two) times daily.  60 tablet  6  . nitroGLYCERIN (NITROSTAT) 0.4 MG SL tablet Place 1 tablet (0.4 mg total) under the tongue every 5 (five) minutes as needed for chest pain (CP or SOB).  25 tablet  4  . pantoprazole (PROTONIX) 40 MG tablet Take 1 tablet (40 mg total) by mouth daily at 6 (six) AM.  30 tablet  2   No current facility-administered medications on file prior to visit.   No Known Allergies Family History  Problem Relation Age of Onset  . CAD Brother     2 brothers - CABG  . Alzheimer's disease Sister   . CAD Sister   . Prostate cancer Brother   . Diabetes     PE: BP 118/72  Pulse 68  Temp(Src) 97.9 F (36.6 C) (Oral)  Resp 12  Ht 5\' 8"  (1.727 m)  Wt 271 lb (122.925 kg)  BMI 41.22 kg/m2  SpO2 97% Wt Readings from Last 3 Encounters:  02/21/14 271 lb (122.925 kg)  02/11/14 263 lb 3.2 oz (119.387 kg)  01/29/14 271 lb 2.7 oz (123 kg)   Constitutional: obese, in NAD Eyes: PERRLA, EOMI, no exophthalmos ENT: moist mucous membranes, no thyromegaly, no cervical lymphadenopathy Cardiovascular: RRR, No MRG Respiratory: CTA B Gastrointestinal: abdomen soft, NT, ND, BS+ Musculoskeletal: no deformities, strength intact in all 4 Skin: moist, warm, no rashes Neurological: no tremor with outstretched hands, DTR normal in all 4  ASSESSMENT: 1. DM2,  ninsulin-dependent, uncontrolled, with complications - CAD  PLAN:  1. Patient with long-standing, uncontrolled diabetes, on premixed + rapid acting insulin regimen, which became insufficient - We discussed about options for treatment, and I suggested to:  Patient Instructions  Please return in 1 month with your sugar log.  Please stop the 70/30 insulin. Start Levemir 75 units at bedtime. Increase NovoLog to: - 30 units with breakfast - 20 units with lunch - 30 units with dinner  Please inject NovoLog 15 min before starting eating. When injecting insulin:  Inject in the abdomen  Rotate the injection sites around the belly button  Change needle for each injection  Keep needle in for 10 sec after last unit of insulin in  Keep the insulin in use out of the fridge - continue checking sugars at different times of the day - check 3-4 times a day, rotating checks - given sugar log and advised how to fill it and to bring it at next appt  - given foot care handout and explained the principles  - given instructions for hypoglycemia management "15-15 rule"  - advised for yearly eye exams >> will refer to ophthalmology - will refer to nutrition per his request - sent Rx for a meter, lancets and strips to his pharmacy - refilled Levemir - given NovoLog pen sample - Return to clinic in 1 mo with sugar log

## 2014-02-24 ENCOUNTER — Ambulatory Visit (INDEPENDENT_AMBULATORY_CARE_PROVIDER_SITE_OTHER): Payer: Medicare Other | Admitting: Physician Assistant

## 2014-02-24 ENCOUNTER — Encounter: Payer: Self-pay | Admitting: Physician Assistant

## 2014-02-24 VITALS — BP 160/74 | HR 80 | Ht 68.0 in | Wt 274.0 lb

## 2014-02-24 DIAGNOSIS — I2510 Atherosclerotic heart disease of native coronary artery without angina pectoris: Secondary | ICD-10-CM | POA: Diagnosis not present

## 2014-02-24 DIAGNOSIS — E785 Hyperlipidemia, unspecified: Secondary | ICD-10-CM | POA: Diagnosis not present

## 2014-02-24 DIAGNOSIS — I200 Unstable angina: Secondary | ICD-10-CM | POA: Diagnosis not present

## 2014-02-24 DIAGNOSIS — I10 Essential (primary) hypertension: Secondary | ICD-10-CM

## 2014-02-24 MED ORDER — LISINOPRIL 20 MG PO TABS: 20.0000 mg | ORAL_TABLET | Freq: Every day | ORAL | Status: DC

## 2014-02-24 MED ORDER — ROSUVASTATIN CALCIUM 20 MG PO TABS: 20.0000 mg | ORAL_TABLET | Freq: Every day | ORAL | Status: DC

## 2014-02-24 NOTE — Assessment & Plan Note (Addendum)
Blood pressure is elevated today. Increase lisinopril to 20 mg once daily. 2 g sodium diet. Check BMET also.

## 2014-02-24 NOTE — Assessment & Plan Note (Signed)
Stable without chest pain on current medicines. Continue risk reduction. Patient could not tolerate Lipitor. We'll try to Crestor 20 mg once daily. Followup with Dr. Pernell Dupre in 2 months.

## 2014-02-24 NOTE — Assessment & Plan Note (Signed)
Patient could not tolerate Lipitor due to leg cramps. We'll try Crestor 20 mg once daily. Check fasting lipid panel and LFTs in 6 weeks.

## 2014-02-24 NOTE — Assessment & Plan Note (Signed)
Diet and weight management recommended. Patient meeting with nutritionist.

## 2014-02-24 NOTE — Progress Notes (Signed)
HPI:   This is a very pleasant 72 year old male patient of Dr. Pernell Dupre who has history of a small MI in 2013 without need for stent or CABG by cath. He recently moved here from [place]. He also has a history of hypertension, diabetes mellitus, and obesity. He was admitted to [facility] on 01/27/14 with unstable angina. Troponins were negative and EKG was without acute change. His blood pressure was 210/100 but dropped to 85/50 with fentanyl and nitroglycerin drip. He was taken to the Cath Lab where he was found to have moderate mid LAD disease without significant obstruction, widely patent circumflex and RCA and normal LV function. Aggressive medical therapy including blood pressure control lipid control and diabetes was recommended.  The patient is trying to get his diabetes under control. He is meeting with a nutritionist. He has not watching his salt as closely as he should be but he is trying. He got severe leg cramps on Lipitor since stopped this. His blood pressure is elevated today but he did take all his medications. He denies any chest pain, palpitations, dyspnea, dyspnea on exertion dizziness or presyncope. He is walking 30 minutes a day and will begin swimming.  No Known Allergies  Current Outpatient Prescriptions on File Prior to Visit: acetaminophen (TYLENOL) 325 MG tablet, Take 2 tablets (650 mg total) by mouth every 6 (six) hours as needed for moderate pain., Disp: , Rfl:  amLODipine (NORVASC) 10 MG tablet, Take 1 tablet (10 mg total) by mouth daily., Disp: 30 tablet, Rfl: 6 aspirin EC 81 MG tablet, Take 81 mg by mouth daily., Disp: , Rfl:  Blood Glucose Monitoring Suppl (ONE TOUCH ULTRA SYSTEM KIT) W/DEVICE KIT, Use to test blood sugar daily as instructed. Dx code: 250.00, Disp: 1 each, Rfl: 0 clonazePAM (KLONOPIN) 1 MG tablet, Take 1 tablet (1 mg total) by mouth at bedtime as needed for anxiety., Disp: 30 tablet, Rfl: 0 eplerenone (INSPRA) 25 MG tablet, Take 1 tablet (25 mg  total) by mouth daily., Disp: 30 tablet, Rfl: 0 gabapentin (NEURONTIN) 100 MG capsule, 1 po tid for 1 week can slowly increase it to 300 mg tid, Disp: 120 capsule, Rfl: 2 glucose blood (BL TEST STRIP PACK) test strip, Use to test blood sugar 4 times daily as instructed. Dx code: 250.00, Disp: 200 each, Rfl: 4 insulin aspart (NOVOLOG) 100 UNIT/ML injection, Inject up to 80 units at mealtime, as advised., Disp: 30 mL, Rfl: 11 insulin detemir (LEVEMIR) 100 UNIT/ML injection, Inject 0.75 mLs (75 Units total) into the skin at bedtime., Disp: 30 mL, Rfl: 11 Insulin Syringes, Disposable, U-100 1 ML MISC, 60 Units by Does not apply route 4 (four) times daily., Disp: 120 each, Rfl: 2 Lancets (ONETOUCH ULTRASOFT) lancets, Use to test blood sugar 4 times daily as instructed. Dx code: 250.00, Disp: 200 each, Rfl: 4 lisinopril (PRINIVIL,ZESTRIL) 10 MG tablet, Take 1 tablet (10 mg total) by mouth daily., Disp: 30 tablet, Rfl: 6 metoprolol (LOPRESSOR) 50 MG tablet, Take 1 tablet (50 mg total) by mouth 2 (two) times daily., Disp: 60 tablet, Rfl: 6 nitroGLYCERIN (NITROSTAT) 0.4 MG SL tablet, Place 1 tablet (0.4 mg total) under the tongue every 5 (five) minutes as needed for chest pain (CP or SOB)., Disp: 25 tablet, Rfl: 4 pantoprazole (PROTONIX) 40 MG tablet, Take 1 tablet (40 mg total) by mouth daily at 6 (six) AM., Disp: 30 tablet, Rfl: 2  No current facility-administered medications on file prior to visit.   Past Medical History:  Hypertension                                                 Arthritis                                                    CAD (coronary artery disease)                                  Comment:a. Pt reports "small heart attack" in 2013 at               [facility], denies need for               stent or CABG, does not know findings of cath.   Pneumonia                                                    TIA (transient ischemic attack)                               Dyslipidemia, goal LDL below 70                 01/29/2014     DM type 2 (diabetes mellitus, type 2), insulin* 01/29/2014     Heart attack                                    02/03/2014       Comment:"mild heart attack"   GERD (gastroesophageal reflux disease)                       Enlarged prostate                                           Past Surgical History:   TOTAL KNEE ARTHROPLASTY                                       CARDIAC CATHETERIZATION                          01/28/14        Comment:+ CAD treat medically  Review of patient's family history indicates:   CAD                            Brother                    Comment: 2 brothers - CABG   Alzheimer's disease            Sister  CAD                            Sister                   Prostate cancer                Brother                  Diabetes                                                Social History   Marital Status: Married             Spouse Name:                      Years of Education:                 Number of children:             Occupational History   None on file  Social History Main Topics   Smoking Status: Never Smoker                     Smokeless Status: Not on file                      Alcohol Use: No             Drug Use: No             Sexual Activity: Not on file        Other Topics            Concern   None on file  Social History Narrative   None on file    ROS: See history of present illness otherwise negative   PHYSICAL EXAM: Obese, in no acute distress. Neck: No JVD, HJR, Bruit, or thyroid enlargement  Lungs: No tachypnea, clear without wheezing, rales, or rhonchi  Cardiovascular: RRR, PMI not displaced, heart sounds distant, no murmurs, gallops, bruit, thrill, or heave.  Abdomen: BS normal. Soft without organomegaly, masses, lesions or tenderness.  Extremities: Right arm without hematoma or hemorrhage at cath site good radial and brachial pulse, lower  extremities without cyanosis, clubbing or edema. Good distal pulses bilateral  SKin: Warm, no lesions or rashes   Musculoskeletal: No deformities  Neuro: no focal signs  BP 160/74  Pulse 80  Ht '5\' 8"'  (1.727 m)  Wt 274 lb (124.286 kg)  BMI 41.67 kg/m2  SpO2 99%

## 2014-02-24 NOTE — Patient Instructions (Signed)
Your physician recommends that you schedule a follow-up appointment in: [address] [place]  Your physician recommends that you return for lab work in: [place] (LFT, [place])  Your physician has recommended you make the following change in your medication:   START CRESTOR 20 MG ONCE A DAY INCREASE LISINOPRIL 20 MG ONCE A DAY  2 Gram Low Sodium Diet A 2 gram sodium diet restricts the amount of sodium in the diet to no more than 2 g or 2000 mg daily. Limiting the amount of sodium is often used to help lower blood pressure. It is important if you have heart, liver, or kidney problems. Many foods contain sodium for flavor and sometimes as a preservative. When the amount of sodium in a diet needs to be low, it is important to know what to look for when choosing foods and drinks. The following includes some information and guidelines to help make it easier for you to adapt to a low sodium diet. QUICK TIPS  Do not add salt to food.  Avoid convenience items and fast food.  Choose unsalted snack foods.  Buy lower sodium products, often labeled as "lower sodium" or "no salt added."  Check food labels to learn how much sodium is in 1 serving.  When eating at a restaurant, ask that your food be prepared with less salt or none, if possible. READING FOOD LABELS FOR SODIUM INFORMATION The nutrition facts label is a good place to find how much sodium is in foods. Look for products with no more than 500 to 600 mg of sodium per meal and no more than 150 mg per serving. Remember that 2 g = 2000 mg. The food label may also list foods as:  Sodium-free: Less than 5 mg in a serving.  Very low sodium: 35 mg or less in a serving.  Low-sodium: 140 mg or less in a serving.  Light in sodium: 50% less sodium in a serving. For example, if a food that usually has 300 mg of sodium is changed to become light in sodium, it will have 150 mg of sodium.  Reduced sodium: 25% less sodium in a serving. For  example, if a food that usually has 400 mg of sodium is changed to reduced sodium, it will have 300 mg of sodium. CHOOSING FOODS Grains  Avoid: Salted crackers and snack items. Some cereals, including instant hot cereals. Bread stuffing and biscuit mixes. Seasoned rice or pasta mixes.  Choose: Unsalted snack items. Low-sodium cereals, oats, puffed wheat and rice, shredded wheat. English muffins and bread. Pasta. Meats  Avoid: Salted, canned, smoked, spiced, pickled meats, including fish and poultry. Bacon, ham, sausage, cold cuts, hot dogs, anchovies.  Choose: Low-sodium canned tuna and salmon. Fresh or frozen meat, poultry, and fish. Dairy  Avoid: Processed cheese and spreads. Cottage cheese. Buttermilk and condensed milk. Regular cheese.  Choose: Milk. Low-sodium cottage cheese. Yogurt. Sour cream. Low-sodium cheese. Fruits and Vegetables  Avoid: Regular canned vegetables. Regular canned tomato sauce and paste. Frozen vegetables in sauces. Olives. Angie Fava. Relishes. Sauerkraut.  Choose: Low-sodium canned vegetables. Low-sodium tomato sauce and paste. Frozen or fresh vegetables. Fresh and frozen fruit. Condiments  Avoid: Canned and packaged gravies. Worcestershire sauce. Tartar sauce. Barbecue sauce. Soy sauce. Steak sauce. Ketchup. Onion, garlic, and table salt. Meat flavorings and tenderizers.  Choose: Fresh and dried herbs and spices. Low-sodium varieties of mustard and ketchup. Lemon juice. Tabasco sauce. Horseradish. SAMPLE 2 GRAM SODIUM MEAL PLAN Breakfast / Sodium (mg)  1 cup low-fat milk / 349 mg  2 slices whole-wheat toast / 270 mg  1 tbs heart-healthy margarine / 153 mg  1 hard-boiled egg / 139 mg  1 small orange / 0 mg Lunch / Sodium (mg)  1 cup raw carrots / 76 mg   cup hummus / 298 mg  1 cup low-fat milk / 143 mg   cup red grapes / 2 mg  1 whole-wheat pita bread / 356 mg Dinner / Sodium (mg)  1 cup whole-wheat pasta / 2 mg  1 cup low-sodium tomato  sauce / 73 mg  3 oz lean ground beef / 57 mg  1 small side salad (1 cup raw spinach leaves,  cup cucumber,  cup yellow bell pepper) with 1 tsp olive oil and 1 tsp red wine vinegar / 25 mg Snack / Sodium (mg)  1 container low-fat vanilla yogurt / 107 mg  3 graham cracker squares / 127 mg Nutrient Analysis  Calories: 2033  Protein: 77 g  Carbohydrate: 282 g  Fat: 72 g  Sodium: 1971 mg Document Released: 10/17/2005 Document Revised: 01/09/2012 Document Reviewed: 01/18/2010 [employer] Patient Information 2014 [place], [place].

## 2014-02-25 ENCOUNTER — Telehealth: Payer: Self-pay

## 2014-02-25 DIAGNOSIS — R350 Frequency of micturition: Secondary | ICD-10-CM | POA: Diagnosis not present

## 2014-02-25 DIAGNOSIS — N400 Enlarged prostate without lower urinary tract symptoms: Secondary | ICD-10-CM | POA: Diagnosis not present

## 2014-02-25 DIAGNOSIS — R3915 Urgency of urination: Secondary | ICD-10-CM | POA: Diagnosis not present

## 2014-02-25 NOTE — Telephone Encounter (Signed)
Relevant patient education assigned to patient using Emmi. ° °

## 2014-03-03 ENCOUNTER — Ambulatory Visit: Payer: Self-pay | Admitting: Podiatry

## 2014-03-10 ENCOUNTER — Telehealth: Payer: Self-pay | Admitting: Internal Medicine

## 2014-03-10 NOTE — Telephone Encounter (Signed)
Samples left, patient is aware

## 2014-03-10 NOTE — Telephone Encounter (Signed)
Pt needs levemir and humalog. He does not have any money to pick up the rx, His check comes in on 03/26/14 but pt will be out of both of those meds by the end of the week

## 2014-03-12 ENCOUNTER — Encounter: Payer: Self-pay | Admitting: Family Medicine

## 2014-03-13 DIAGNOSIS — E1049 Type 1 diabetes mellitus with other diabetic neurological complication: Secondary | ICD-10-CM | POA: Diagnosis not present

## 2014-03-17 ENCOUNTER — Encounter (INDEPENDENT_AMBULATORY_CARE_PROVIDER_SITE_OTHER): Payer: Medicare Other | Admitting: Ophthalmology

## 2014-03-17 DIAGNOSIS — E1139 Type 2 diabetes mellitus with other diabetic ophthalmic complication: Secondary | ICD-10-CM

## 2014-03-17 DIAGNOSIS — H35039 Hypertensive retinopathy, unspecified eye: Secondary | ICD-10-CM

## 2014-03-17 DIAGNOSIS — E11319 Type 2 diabetes mellitus with unspecified diabetic retinopathy without macular edema: Secondary | ICD-10-CM | POA: Diagnosis not present

## 2014-03-17 DIAGNOSIS — H43819 Vitreous degeneration, unspecified eye: Secondary | ICD-10-CM

## 2014-03-17 DIAGNOSIS — I10 Essential (primary) hypertension: Secondary | ICD-10-CM

## 2014-03-17 DIAGNOSIS — E1165 Type 2 diabetes mellitus with hyperglycemia: Secondary | ICD-10-CM | POA: Diagnosis not present

## 2014-03-17 LAB — HM DIABETES EYE EXAM

## 2014-03-21 ENCOUNTER — Ambulatory Visit: Payer: Medicare Other | Admitting: Internal Medicine

## 2014-03-26 ENCOUNTER — Other Ambulatory Visit (INDEPENDENT_AMBULATORY_CARE_PROVIDER_SITE_OTHER): Payer: Federal, State, Local not specified - PPO | Admitting: Ophthalmology

## 2014-03-27 ENCOUNTER — Encounter: Payer: Self-pay | Admitting: Internal Medicine

## 2014-03-27 DIAGNOSIS — M7500 Adhesive capsulitis of unspecified shoulder: Secondary | ICD-10-CM | POA: Diagnosis not present

## 2014-03-27 DIAGNOSIS — M719 Bursopathy, unspecified: Secondary | ICD-10-CM | POA: Diagnosis not present

## 2014-03-27 DIAGNOSIS — M67919 Unspecified disorder of synovium and tendon, unspecified shoulder: Secondary | ICD-10-CM | POA: Diagnosis not present

## 2014-03-31 ENCOUNTER — Encounter: Payer: Medicare Other | Attending: Internal Medicine | Admitting: *Deleted

## 2014-03-31 ENCOUNTER — Encounter: Payer: Self-pay | Admitting: *Deleted

## 2014-03-31 VITALS — Ht 68.0 in | Wt 264.7 lb

## 2014-03-31 DIAGNOSIS — Z713 Dietary counseling and surveillance: Secondary | ICD-10-CM | POA: Insufficient documentation

## 2014-03-31 DIAGNOSIS — E119 Type 2 diabetes mellitus without complications: Secondary | ICD-10-CM | POA: Diagnosis not present

## 2014-03-31 NOTE — Progress Notes (Signed)
Medical Nutrition Therapy:  Appt start time: 1100 end time:  1200.  Assessment:  Primary concern today: diabetes. Patient has had diabetes for 28 years (diagnosed in 49). He has been retired for about a year and recently moved to [place]. He reports that it was challenging to control his blood glucose prior to retirement due to stress, busy schedule, and frequent eating out. Now that he is retired, he has the time to focus on making lifestyle changes. Medications recently changed. He checks his blood glucose 4 times daily with values ranging from 200s in the morning to 250-280 before meals. He does report frequent lows around 11am many mornings. This is likely related to new exercise regimen. Overall diet is fairly healthy at meals. However, he has a very high intake of sugar and sweets. He recognizes this as something he needs to change and is motivated to make the necessary changes. He does report eating out less. He also enjoys cooking shows and likes to prepare new foods. He also reports a weight loss of about 5 pounds in the last month.  MEDICATIONS: Levemir 70 units PM, Novolog TID (30 breakfast, 20 lunch, 30 dinner)   DIETARY INTAKE:   Usual eating pattern includes 3 meals and >/=3 snacks per day.  24-hr recall:  B ( AM): Oatmeal (2 packets) with 1 tbsp sugar, juice 8-12 ounces, hot tea with sugar  Snk ( AM): Sweets (oatmeal cookies)  L ( PM): Hamburger/chicken/salmon, green peas with sugar/corn/baked beans/potatoes, salad Snk ( PM): Same D ( PM): Same Snk ( PM): Same Beverages: tea, juice, Coke  Usual physical activity: Walking every morning, 2 hours swimming  Estimated energy needs: 2000 calories 225 g carbohydrates 125 g protein 67 g fat  Progress Towards Goal(s):  In progress.   Nutritional Diagnosis:  NB-1.1 Food and nutrition-related knowledge deficit As related to diabetes.  As evidenced by patient report.    Intervention:  Nutrition counseling. We discussed basic  carb counting, including foods with carbs, label reading, portion size, and meal planning.   Goals:  1. 4 carb servings (+/- 1 serving) at meals. 1 serving at snacks.  2. Reduce sugar intake by switching to Splenda or cutting portions in half.  3. Decrease intake of juice and sweetened drinks. Try water flavorings as an alternative.  4. Limit snacks to 15 gram servings 3 times daily. Choose healthy snacks more often.  5. Continue exercising as currently. Monitor AM blood glucose for lows with reduced carb intake.  6. Use plate method for meal planning.   Handouts given during visit include:  Food label handout  Plate method  Meal plan card  Monitoring/Evaluation:  Dietary intake, exercise, blood glucose, and body weight in 6 week(s).

## 2014-04-02 ENCOUNTER — Ambulatory Visit: Payer: Medicare Other | Admitting: Family Medicine

## 2014-04-03 ENCOUNTER — Ambulatory Visit (INDEPENDENT_AMBULATORY_CARE_PROVIDER_SITE_OTHER): Payer: Medicare Other | Admitting: Gastroenterology

## 2014-04-03 ENCOUNTER — Encounter: Payer: Self-pay | Admitting: Gastroenterology

## 2014-04-03 VITALS — BP 170/70 | HR 84 | Ht 67.0 in | Wt 265.5 lb

## 2014-04-03 DIAGNOSIS — K3189 Other diseases of stomach and duodenum: Secondary | ICD-10-CM

## 2014-04-03 DIAGNOSIS — K219 Gastro-esophageal reflux disease without esophagitis: Secondary | ICD-10-CM | POA: Diagnosis not present

## 2014-04-03 DIAGNOSIS — I200 Unstable angina: Secondary | ICD-10-CM | POA: Diagnosis not present

## 2014-04-03 DIAGNOSIS — R1013 Epigastric pain: Secondary | ICD-10-CM | POA: Diagnosis not present

## 2014-04-03 NOTE — Patient Instructions (Signed)
Please call our office at [phone] and ask to speak to Estill Bamberg so we can get the name and number of your prior Gastroenterologist.   Thank you for choosing me and [facility].  Pricilla Riffle. Dagoberto Ligas., MD., Marval Regal

## 2014-04-03 NOTE — Progress Notes (Addendum)
    History of Present Illness: This is a 73 year old male with chronic GERD. His reflux symptoms are well controlled on daily pantoprazole. He states he has mild epigastric discomfort when he uses NSAIDs frequently, generally for right shoulder pain. He states he underwent colonoscopy and upper endoscopy about 3 years ago in [place]. He is not sure when a followup colonoscopy was recommended. He states he may have records from his colonoscopy and endoscopy at home or he can supply [place] with the name and number of his gastroenterologist in [place]. Denies weight loss, abdominal pain, constipation, diarrhea, change in stool caliber, melena, hematochezia, nausea, vomiting, dysphagia, chest pain.  Review of Systems: Pertinent positive and negative review of systems were noted in the above HPI section. All other review of systems were otherwise negative.  Current Medications, Allergies, Past Medical History, Past Surgical History, Family History and Social History were reviewed in [employer] record.  Physical Exam: General: Well developed , well nourished, no acute distress Head: Normocephalic and atraumatic Eyes:  sclerae anicteric, EOMI Ears: Normal auditory acuity Mouth: No deformity or lesions Neck: Supple, no masses or thyromegaly Lungs: Clear throughout to auscultation Heart: Regular rate and rhythm; no murmurs, rubs or bruits Abdomen: Soft, non tender and non distended. No masses, hepatosplenomegaly or hernias noted. Normal Bowel sounds Musculoskeletal: Symmetrical with no gross deformities  Skin: No lesions on visible extremities Pulses:  Normal pulses noted Extremities: No clubbing, cyanosis, edema or deformities noted Neurological: Alert oriented x 4, grossly nonfocal Cervical Nodes:  No significant cervical adenopathy Inguinal Nodes: No significant inguinal adenopathy Psychological:  Alert and cooperative. Normal mood and affect  Assessment and  Recommendations:  1. GERD. Continue pantoprazole 40 mg daily and standard antireflux measures. Advised him to speak to his orthopedic surgeon her PCP regarding treatment of his left shoulder pain. Await records from his prior endoscopy.  2. Colorectal cancer screening. Await records from prior colonoscopy to determine an appropriate interval for his next colonoscopy.   05/14/14. Records from Dr. Gildardo Pounds received and reviewed. The patient underwent colonoscopy on 04/29/2011 showing 3 small adenomatous colon polyps, mild right colon diverticulosis and internal hemorrhoids. He was recommended to repeat colonoscopy in 3 years.EGD performed on 12/29/2010 showed a small hiatal hernia, mild erosive antral gastritis. Biopsies showed reactive gastropathy.   Given that he had polyps measuring under 5 mm I feel that a five-year surveillance colonoscopy interval, February 2017, is reasonable.

## 2014-04-07 ENCOUNTER — Other Ambulatory Visit: Payer: Medicare Other

## 2014-04-08 ENCOUNTER — Encounter: Payer: Self-pay | Admitting: Neurology

## 2014-04-08 ENCOUNTER — Ambulatory Visit (INDEPENDENT_AMBULATORY_CARE_PROVIDER_SITE_OTHER): Payer: Medicare Other | Admitting: Neurology

## 2014-04-08 VITALS — BP 146/66 | HR 75 | Ht 67.0 in | Wt 262.0 lb

## 2014-04-08 DIAGNOSIS — E1149 Type 2 diabetes mellitus with other diabetic neurological complication: Secondary | ICD-10-CM

## 2014-04-08 DIAGNOSIS — E1142 Type 2 diabetes mellitus with diabetic polyneuropathy: Secondary | ICD-10-CM | POA: Diagnosis not present

## 2014-04-08 DIAGNOSIS — E1140 Type 2 diabetes mellitus with diabetic neuropathy, unspecified: Secondary | ICD-10-CM

## 2014-04-08 DIAGNOSIS — I200 Unstable angina: Secondary | ICD-10-CM

## 2014-04-08 NOTE — Progress Notes (Signed)
[facility] Clinic Note - Initial Visit   Date: 04/08/2014  Edward [name] MRN: [id number] DOB: 08-17-1941   Dear Edward [name]:  Thank you for your kind referral of Edward [name] for consultation of neuropathy. Although his history is well known to you, please allow [place] to reiterate it for the purpose of our medical record. The patient was accompanied to the clinic by self.   History of Present Illness: Edward [name] is a 73 y.o. right-handed African American male with history of history of coronary artery disease, TIA (2002, manifested as right sided numbness), diabetes mellitus type 2 (HbA1c 9.1), hypertension, hyperlipidemia, GERD and BPH presenting for evaluation of neuropathy.  He moved from [place] in February 2015 to be closer to his brother, so is here to establish care.  He has been diabetic since 1985 and in late 1990s, he developed numbness > tingling of the feet which seems to have been stable since onset. He currently complains of tingling and numbness below the level of the ankles, worse on the right side.  He has interemittent numbness/tingling of the right hand and also complains of muscle cramps.  He reports falling twice this year, once when he was climbing stairs.  He ambulates independently.  He saw Edward. Etter [name] in April who started neurontin 165m twice daily which seems to help slightly.  He has previously been a patient of Edward. [facility] where he has recently moved. His last clinic note dated 08/14/2013, indicates that he was being seen for history of memory loss and headaches.  Headaches improved significantly after he retired. He has short-term memory problems, which does not interefere with daily life.  Mostly forgetting little details or tasks.  He is driving and has not been involving in any accidents.  He is able to do all his IADLs and ADLs.  No problems with finances.  Mood is good and [place]  He stays active in church and has good social support.  Out-side paper records, electronic medical record, and images have been reviewed where available and summarized as:  Labs 01/27/2014: Hemoglobin A1c 9.1, TSH 1.37, sodium 138, potassium 4.0, creatinine 0.85, calcium 8.3*  MRA head 08/21/2013: Normal  Ultrasound carotid 08/14/2013:  Less than 50% plaque observed in the bilateral ICA.  Transcranial Doppler 08/14/2013: Abnormal anterior circulation with elevated right MCA flow but normal vertebrobasilar flow.  MRI brain  2013: No acute findings. There is evidence of chronic small vessel disease.  Past Medical History  Diagnosis Date  . Hypertension   . Arthritis   . CAD (coronary artery disease)     a. [facility] denies need for stent or CABG, does not know findings of cath.  . Pneumonia   . TIA (transient ischemic attack) 2002  . Dyslipidemia, goal LDL below 70 01/29/2014  . DM type 2 (diabetes mellitus, type 2), insulin dependent 01/29/2014  . Heart attack 02/03/2014    "mild heart attack"  . GERD (gastroesophageal reflux disease)   . Enlarged prostate     Past Surgical History  Procedure Laterality Date  . Total knee arthroplasty Bilateral   . Cardiac catheterization  01/28/14    + CAD treat medically  . Lumbar disc surgery    . Cataract extraction Bilateral      Medications:  Current Outpatient Prescriptions on File Prior to Visit  Medication Sig Dispense Refill  .  acetaminophen (TYLENOL) 325 MG tablet Take 2 tablets (650 mg total) by mouth every 6 (six) hours as needed for moderate pain.      Marland Kitchen amLODipine (NORVASC) 10 MG tablet Take 1 tablet (10 mg total) by mouth daily.  30 tablet  6  . aspirin EC 81 MG tablet Take 81 mg by mouth daily.      . clonazePAM (KLONOPIN) 1 MG tablet Take 1 tablet (1 mg total) by mouth at bedtime as needed for anxiety.  30 tablet  0  . eplerenone (INSPRA) 25 MG tablet  Take 1 tablet (25 mg total) by mouth daily.  30 tablet  0  . gabapentin (NEURONTIN) 100 MG capsule 1 po tid for 1 week can slowly increase it to 300 mg tid  120 capsule  2  . insulin aspart (NOVOLOG) 100 UNIT/ML injection Inject up to 80 units at mealtime, as advised.  30 mL  11  . insulin detemir (LEVEMIR) 100 UNIT/ML injection Inject 0.75 mLs (75 Units total) into the skin at bedtime.  30 mL  11  . lisinopril (PRINIVIL,ZESTRIL) 20 MG tablet Take 1 tablet (20 mg total) by mouth daily.  30 tablet  6  . metoprolol (LOPRESSOR) 50 MG tablet Take 1 tablet (50 mg total) by mouth 2 (two) times daily.  60 tablet  6  . pantoprazole (PROTONIX) 40 MG tablet Take 1 tablet (40 mg total) by mouth daily at 6 (six) AM.  30 tablet  2  . rosuvastatin (CRESTOR) 20 MG tablet Take 1 tablet (20 mg total) by mouth daily.  30 tablet  3  . Blood Glucose Monitoring Suppl (ONE TOUCH ULTRA SYSTEM KIT) W/DEVICE KIT Use to test blood sugar daily as instructed. Dx code: 250.00  1 each  0  . glucose blood (BL TEST STRIP PACK) test strip Use to test blood sugar 4 times daily as instructed. Dx code: 250.00  200 each  4  . Insulin Syringes, Disposable, U-100 1 ML MISC 60 Units by Does not apply route 4 (four) times daily.  120 each  2  . Lancets (ONETOUCH ULTRASOFT) lancets Use to test blood sugar 4 times daily as instructed. Dx code: 250.00  200 each  4  . nitroGLYCERIN (NITROSTAT) 0.4 MG SL tablet Place 1 tablet (0.4 mg total) under the tongue every 5 (five) minutes as needed for chest pain (CP or SOB).  25 tablet  4   No current facility-administered medications on file prior to visit.    Allergies: No Known Allergies  Family History: Family History  Problem Relation Age of Onset  . CAD Brother     2 brothers - CABG  . Alzheimer's disease Sister   . CAD Sister   . Prostate cancer Brother   . Diabetes Other     entire family    Social History: History   Social History  . Marital Status: Married    Spouse Name:  N/A    Number of Children: 0  . Years of Education: N/A   Occupational History  . retired    Social History Main Topics  . Smoking status: Never Smoker   . Smokeless tobacco: Never Used  . Alcohol Use: No  . Drug Use: No  . Sexual Activity: Not on file   Other Topics Concern  . 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  He lives alone.  His wife is living in [place] at this time.  They do not have children.   Highest level of education:  College graduate    Review of Systems:  CONSTITUTIONAL: No fevers, chills, night sweats + 13 lb intentional weight loss.   EYES: No visual changes or eye pain ENT: No hearing changes.  No history of nose bleeds.   RESPIRATORY: No cough, wheezing and shortness of breath.   CARDIOVASCULAR: Negative for chest pain, and palpitations.   GI: Negative for abdominal discomfort, blood in stools or black stools.  No recent change in bowel habits.   GU:  No history of incontinence.   MUSCLOSKELETAL: No history of joint pain or swelling.  No myalgias.   SKIN: Negative for lesions, rash, and itching.   HEMATOLOGY/ONCOLOGY: Negative for prolonged bleeding, bruising easily, and swollen nodes.     ENDOCRINE: Negative for cold or heat intolerance, polydipsia or goiter.   PSYCH:  + depression or anxiety symptoms.   NEURO: As Above.   Vital Signs:  BP 146/66  Pulse 75  Ht '5\' 7"'  (1.702 m)  Wt 262 lb (118.842 kg)  BMI 41.03 kg/m2 Pain Scale: 0 on a scale of 0-10   General Medical Exam:   General:  Well appearing, comfortable.   Eyes/ENT: see cranial nerve examination.   Neck: No masses appreciated.  Full range of motion without tenderness.  No carotid bruits. Respiratory:  Clear to auscultation, good air entry bilaterally.   Cardiac:  Regular rate and rhythm, no murmur.   Extremities:  Bilateral shoulder ROM is reduced.   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   Neurological Exam: MENTAL STATUS including orientation to time, place, person, recent and remote memory, attention span and concentration, language, and fund of knowledge is normal.  Speech is not dysarthric, but there is moderate stuttering of his speech.  CRANIAL NERVES: II:  No visual field defects.  Unremarkable fundi.   III-IV-VI: Pupils equal round and reactive to light.  Normal conjugate, extra-ocular eye movements in all directions of gaze.  No nystagmus.  Moderate baseline ptosis bilaterally. V:  Normal facial sensation.    VII:  Normal facial symmetry and movements.  No pathologic facial reflexes.  VIII:  Normal hearing and vestibular function.   IX-X:  Normal palatal movement.   XI:  Normal shoulder shrug and head rotation.   XII:  Normal tongue strength and range of motion, no deviation or fasciculation.  MOTOR:  No atrophy, fasciculations or abnormal movements.  No pronator drift.  Tone is normal.    Right Upper Extremity:    Left Upper Extremity:    Deltoid  5/5   Deltoid  5/5   Biceps  5/5   Biceps  5/5   Triceps  5/5   Triceps  5/5   Wrist extensors  5/5   Wrist extensors  5/5   Wrist flexors  5/5   Wrist flexors  5/5   Finger extensors  5/5   Finger extensors  5/5   Finger flexors  5/5   Finger flexors  5/5   Dorsal interossei  5/5   Dorsal interossei  5/5   Abductor pollicis  5/5   Abductor pollicis  5/5   Tone (Ashworth scale)  0  Tone (Ashworth scale)  0   Right Lower Extremity:    Left Lower Extremity:    Hip flexors  5/5   Hip flexors  5/5   Hip  extensors  5/5   Hip extensors  5/5   Knee flexors  5/5   Knee flexors  5/5   Knee extensors  5/5   Knee extensors  5/5   Dorsiflexors  5/5   Dorsiflexors  5/5   Plantarflexors  5/5   Plantarflexors  5/5   Toe extensors  5/5   Toe extensors  5/5   Toe flexors  5/5   Toe flexors  5/5   Tone (Ashworth scale)  0  Tone (Ashworth scale)  0   MSRs:  Right                                                                  Left brachioradialis 2+  brachioradialis 2+  biceps 1+  Biceps 1+  triceps 1+  triceps 1+  patellar trace  Patellar trace  ankle jerk 0  ankle jerk 0  Hoffman no  Hoffman no  plantar response down  plantar response down   SENSORY:  Vibration reduced to 80% at knees and ankle 30% at great toe. Pin prick, light touch, and proprioception intact.  Romberg's sign absent.   COORDINATION/GAIT: Normal finger-to- nose-finger and heel-to-shin.   Able to rise from a chair without using arms.  Slightly wide-based gait, but appears stable.  Unsteady with tandem and stressed gait.    IMPRESSION: Mr. Edward [name] is a 73 year old gentleman presenting for evaluation of paresthesias involving his feet. 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 His symptoms have been stable for a number of years, however recently, he started noticing intermittent paresthesias of his right hand and worsening pain.  Determine whether his right hand symptoms are associated with neuropathy or an underlying nerve entrapment such as carpal tunnel syndrome, EMG will be ordered.  Although he most likely has diabetic peripheral neuropathy, I would like to check labs for other potentially treatable causes of peripheral neuropathy.  He was started on Neurontin and instructed to titrate further, but due to cognitive side effects he is taking 100 mg twice daily which seems to help.  He also has symptoms suggestive of mild cognitive impairment, but is still able to carry out all of his daily activities. I will continue to follow him clinically and at his next visit perform [place].   PLAN/RECOMMENDATIONS:  1.  EMG of the right arm and leg 2.  Check copper, ceruloplasmin, vitamin B12, SPEP/UPEP with IFE 3.  Continue neuorntin 234m at bedtime 4.  Encouraged tight glycemic control  5   Fall precautions discussed 6.  Encouraged him to continue doing  water exercises  7.  Return to clinic in 391-month   The duration of this appointment visit was 40 minutes of face-to-face time with the patient.  Greater than 50% of this time was spent in counseling, explanation of diagnosis, planning of further management, and coordination of care.   Thank you for allowing me to participate in patient's care.  If I can answer any additional questions, I would be pleased to do so.    Sincerely,    Donika K. PaPosey ProntoDO

## 2014-04-08 NOTE — Patient Instructions (Signed)
1.  EMG of the right arm and leg 2.  Check blood work today 3.  Continue neurontin 200mg  at bedtime 4.  Continue water therapy 5.  Return to clinic in 100-months   [facility]  Preventing Falls in the [place] are common, often dreaded events in the lives of older people. Aside from the obvious injuries and even death that may result, falls can cause wide-ranging consequences including loss of independence, mental decline, decreased activity, and mobility. Younger people are also at risk of falling, especially those with chronic illnesses and fatigue.  Ways to reduce the risk for falling:  * Examine diet and medications. Warm foods and alcohol dilate blood vessels, which can lead to dizziness when standing. Sleep aids, antidepressants, and pain medications can also increase the likelihood of a fall.  * Get a vison exam. Poor vision, cataracts, and glaucoma increase the chances of falling.  * Check foot gear. Shoes should fit snugly and have a sturdy, nonskid sole and broad, low heel.  * Participate in a physician-approved exercise program to build and maintain muscle strength and improve balance and coordination.  * Increase vitamin D intake. Vitamin D improves muscle strength and increases the amount of calcium the body is able to absorb and deposit in bones.  How to prevent falls from common hazards:  * Floors - Remove all loose wires, cords, and throw rugs. Minimize clutter. Make sure rugs are anchored and smooth. Keep furniture in its usual place.  * Chairs - Use chairs with straight backs, armrests, and firm seats. Add firm cushions to existing pieces to add height.  * Bathroom - Install grab bars and non-skid tape in the tub or shower. Use a bathtub transfer bench or a shower chair with a back support. Use an elevated toilet seat and/or safety rails to assist standing from a low surface. Do not use towel racks or bathroom tissue holders to help you stand.  * Lighting - Make sure  halls, stairways, and entrances are well-lit. Install a night light in your bathroom or hallway. Make sure there is a light switch at the top and bottom of the staircase. Turn lights on if you get up in the middle of the night. Make sure lamps or light switches are within reach of the bed if you have to get up during the night.  * Kitchen - Install non-skid rubber mats near the sink and stove. Clean spills immediately. Store frequently used utensils, pots, and pans between waist and eye level. This helps prevent reaching and bending. Sit when getting things out of the lower cupboards.  * Living room / [place] furniture with wide spaces in between, giving enough room to move around. Establish a route through the living room that gives you something to hold onto as you walk.  * Stairs - Make sure treads, rails, and rugs are secure. Install a rail on both sides of the stairs. If stairs are a threat, it might be helpful to arrange most of your activities on the lower level to reduce the number of times you must climb the stairs.  * Entrances and doorways - Install metal handles on the walls adjacent to the doorknobs of all doors to make it more secure as you travel through the doorway.  Tips for maintaining balance:  * Keep at least one hand free at all times Try using a backpack or fanny pack to hold things rather than carrying them in your hands.  Never carry objects in both hands when walking as this interferes with keeping your balance.  * Attempt to swing both arms from front to back while walking. This might require a conscious effort if Parkinson's disease has diminished your movement. It will, however, help you to maintain balance and posture, and reduce fatigue.  * Consciously lift your feet off the ground when walking. Shuffling and dragging of the feet is a common culprit in losing your balance.  * When trying to navigate turns, use a "U" technique of facing forward and making a wide turn,  rather than pivoting sharply.  * Try to stand with your feet shoulder-length apart. When your feet are close together for any length of time, you increase your risk of losing your balance and falling.  * Do one thing at a time. Do not try to walk and accomplish another task, such as reading or looking around. The decrease in your automatic reflexes complicates motor function, so the less distraction, the better.  * Do not wear rubber or gripping soled shoes, they might "catch" on the floor and cause tripping.  * Move slowly when changing positions. Use deliberate, concentrated movements and, if needed, use a grab bar or walking aid. Count fifteen (15) seconds after standing to begin walking.  * If balance is a continuous problem, you might want to consider a walking aid such as a cane, walking stick, or walker. Once you have mastered walking with help, you may be ready to try it again on your own.  This information is provided by [facility] and is not intended to replace the medical advice of your physician or other health care providers. Please consult your physician or other health care providers for advice regarding your specific medical condition.

## 2014-04-08 NOTE — Progress Notes (Signed)
Note faxed.

## 2014-04-09 LAB — UIFE/LIGHT CHAINS/TP QN, 24-HR UR
ALPHA 1 UR: DETECTED — AB
Albumin, U: DETECTED
Alpha 2, Urine: DETECTED — AB
Beta, Urine: DETECTED — AB
FREE KAPPA/LAMBDA RATIO: 32.75 ratio — AB (ref 2.04–10.37)
Free Kappa Lt Chains,Ur: 6.55 mg/dL — ABNORMAL HIGH (ref 0.14–2.42)
Free Lambda Lt Chains,Ur: 0.2 mg/dL (ref 0.02–0.67)
Gamma Globulin, Urine: DETECTED — AB
TOTAL PROTEIN, URINE-UPE24: 15 mg/dL

## 2014-04-09 LAB — VITAMIN B12: Vitamin B-12: 290 pg/mL (ref 211–911)

## 2014-04-10 ENCOUNTER — Other Ambulatory Visit (INDEPENDENT_AMBULATORY_CARE_PROVIDER_SITE_OTHER): Payer: Medicare Other | Admitting: Ophthalmology

## 2014-04-10 DIAGNOSIS — E1039 Type 1 diabetes mellitus with other diabetic ophthalmic complication: Secondary | ICD-10-CM

## 2014-04-10 DIAGNOSIS — E11359 Type 2 diabetes mellitus with proliferative diabetic retinopathy without macular edema: Secondary | ICD-10-CM | POA: Diagnosis not present

## 2014-04-10 LAB — SPEP & IFE WITH QIG
ALBUMIN ELP: 57.9 % (ref 55.8–66.1)
ALPHA-1-GLOBULIN: 4.1 % (ref 2.9–4.9)
Alpha-2-Globulin: 11.7 % (ref 7.1–11.8)
Beta 2: 5.3 % (ref 3.2–6.5)
Beta Globulin: 6.1 % (ref 4.7–7.2)
Gamma Globulin: 14.9 % (ref 11.1–18.8)
IgA: 203 mg/dL (ref 68–379)
IgG (Immunoglobin G), Serum: 955 mg/dL (ref 650–1600)
IgM, Serum: 41 mg/dL (ref 41–251)
Total Protein, Serum Electrophoresis: 6.3 g/dL (ref 6.0–8.3)

## 2014-04-10 LAB — COPPER, SERUM: Copper: 64 ug/dL — ABNORMAL LOW (ref 70–175)

## 2014-04-10 LAB — CERULOPLASMIN: Ceruloplasmin: 18 mg/dL (ref 18–36)

## 2014-04-13 ENCOUNTER — Encounter: Payer: Self-pay | Admitting: Family Medicine

## 2014-04-14 ENCOUNTER — Ambulatory Visit (INDEPENDENT_AMBULATORY_CARE_PROVIDER_SITE_OTHER): Payer: Medicare Other | Admitting: Internal Medicine

## 2014-04-14 ENCOUNTER — Encounter: Payer: Self-pay | Admitting: Internal Medicine

## 2014-04-14 VITALS — BP 138/76 | HR 74 | Temp 98.1°F | Resp 16 | Ht 68.0 in | Wt 265.2 lb

## 2014-04-14 DIAGNOSIS — E119 Type 2 diabetes mellitus without complications: Secondary | ICD-10-CM

## 2014-04-14 DIAGNOSIS — I200 Unstable angina: Secondary | ICD-10-CM | POA: Diagnosis not present

## 2014-04-14 NOTE — Progress Notes (Signed)
Patient ID: Edward [name], male   DOB: 1941-09-06, 73 y.o.   MRN: [id number]  HPI: Edward [name] is a 73 y.o.-year-old male, returning for f/u for DM2 dx 1987, insulin-dependent since 1989, uncontrolled, with complications (CAD, PN). He moved from [place] in 12/2013. Last visit 2 mo ago.  He had 2 cortisone inj's since last visit.   Last hemoglobin A1c was: Lab Results  Component Value Date   HGBA1C 9.1* 01/27/2014   Pt was on a regimen of (prefers syringes, not pens): - Novolog 70/30 units 60 units 2x a day - NovoLog 20 units 2x a day He was on Metformin >> N/V/D. He ran out of insulin for a week in 12/2013.  At last visit, we switched to: - Levemir 75 units at bedtime. - NovoLog: - 30 units with breakfast - 20 units with lunch - 30 units with dinner  Pt checks his sugars 4-5 a day and they are high, especially around the time of his steroid injections: - am: 180-270 >> 100-331 (436)  - 2h after b'fast: n/c >> 200 - before lunch: 280-310 >> 216, 294, 402 - 2h after lunch: n/c >> 222, 375, 545 - before dinner: 280s >> 177 - 2h after dinner: n/c - bedtime: 200-300 >> 190 - nighttime: n/c Has some lows. Lowest sugar was 51 in am - after using 70/30 at night!!! - he has hypoglycemia awareness at 90  Highest sugar was 545 x 1  - cortisone.  Has a One Touch Ultra meter.   Pt's meals are: - Breakfast: eggs, oatmeal, grits, bacon - Lunch: baked chiken + vegetables - Dinner: same as lunch - Snacks: sweets - cookies Rarely fast food.  He is walking in am and swims in the swimming pool.   - no CKD, last BUN/creatinine:  Lab Results  Component Value Date   BUN 10 01/28/2014   CREATININE 0.85 01/28/2014  On Lisinopril. ACR 37.6 in 02/11/2014) - Has HL. Last set of lipids: Lab Results  Component Value Date   CHOL 151 01/28/2014   HDL 32* 01/28/2014   LDLCALC 92 01/28/2014   TRIG 134 01/28/2014   CHOLHDL 4.7 01/28/2014  Not on a statin. He gets leg cramps from Lipitor >> cannot  tolerate it. - last eye exam was in 07/2013. ? DR. Had cataracts sx. He saw Dr Zigmund Daniel >> will need palpebral sx.  - + numbness and tingling in his feet >> will start neurontin. He saw podiatrist (02/2014) >> has PN >> he will get new socks.  On ASA 81.  I reviewed pt's medications, allergies, PMH, social hx, family hx and no changes required, except as mentioned above.  ROS: Constitutional: + weight loss, + fatigue, + hot flushes, + poor sleep, + nocturia Eyes: no blurry vision, no xerophthalmia ENT: no sore throat, no nodules palpated in throat, no dysphagia/odynophagia, no hoarseness Cardiovascular: + occasional CP/SOB/no palpitations/no leg swelling Respiratory: no cough/SOB Gastrointestinal: no N/V/D/C/ heartburn Musculoskeletal: + muscle aches/+ joint aches Skin: no rashes Neurological: no tremors/numbness/tingling/dizziness  PE: BP 138/76  Pulse 74  Temp(Src) 98.1 F (36.7 C)  Resp 16  Ht 5\' 8"  (1.727 m)  Wt 265 lb 3.2 oz (120.294 kg)  BMI 40.33 kg/m2  SpO2 97% Wt Readings from Last 3 Encounters:  04/14/14 265 lb 3.2 oz (120.294 kg)  04/08/14 262 lb (118.842 kg)  04/03/14 265 lb 8 oz (120.43 kg)   Constitutional: obese, in NAD Eyes: PERRLA, EOMI, no exophthalmos ENT: moist mucous membranes, no  thyromegaly, no cervical lymphadenopathy Cardiovascular: RRR, No MRG Respiratory: CTA B Gastrointestinal: abdomen soft, NT, ND, BS+ Musculoskeletal: no deformities, strength intact in all 4 Skin: moist, warm, no rashes Neurological: no tremor with outstretched hands, DTR normal in all 4  ASSESSMENT: 1. DM2, ninsulin-dependent, uncontrolled, with complications - CAD  PLAN:  1. Patient with long-standing, uncontrolled diabetes, on premixed + rapid acting insulin regimen - with high sugars lately 2/2 steroid injections - We discussed about options for treatment, and I suggested to:  Patient Instructions  - Please increase the Levemir to 90 units at bedtime. - Please  increase NovoLog to: - 40 units with breakfast - 30 units with lunch - 40 units with dinner Please return in 1 month with your sugar log.  - continue checking sugars at different times of the day - check 3-4 times a day, rotating checks - up to date with yearly eye exams  - he will call us when he needs refills of insulin and syringes - need 3 mo supply - Return to clinic in 1 mo with sugar log

## 2014-04-14 NOTE — Patient Instructions (Signed)
-   Please increase the Levemir to 80 units at bedtime. - Please increase NovoLog to: - 40 units with breakfast - 30 units with lunch - 40 units with dinner Please return in 1 month with your sugar log.

## 2014-04-16 ENCOUNTER — Observation Stay (HOSPITAL_COMMUNITY)
Admission: EM | Admit: 2014-04-16 | Discharge: 2014-04-17 | Disposition: A | Discharge status: Home / Self Care | Payer: Medicare Other | Attending: Internal Medicine | Admitting: Internal Medicine

## 2014-04-16 ENCOUNTER — Emergency Department (HOSPITAL_COMMUNITY): Payer: Medicare Other

## 2014-04-16 ENCOUNTER — Encounter (HOSPITAL_COMMUNITY): Payer: Self-pay | Admitting: Emergency Medicine

## 2014-04-16 DIAGNOSIS — K219 Gastro-esophageal reflux disease without esophagitis: Secondary | ICD-10-CM | POA: Diagnosis not present

## 2014-04-16 DIAGNOSIS — Z6841 Body Mass Index (BMI) 40.0 and over, adult: Secondary | ICD-10-CM | POA: Diagnosis not present

## 2014-04-16 DIAGNOSIS — Z7982 Long term (current) use of aspirin: Secondary | ICD-10-CM | POA: Insufficient documentation

## 2014-04-16 DIAGNOSIS — E669 Obesity, unspecified: Secondary | ICD-10-CM | POA: Diagnosis not present

## 2014-04-16 DIAGNOSIS — N400 Enlarged prostate without lower urinary tract symptoms: Secondary | ICD-10-CM | POA: Insufficient documentation

## 2014-04-16 DIAGNOSIS — R0602 Shortness of breath: Secondary | ICD-10-CM | POA: Insufficient documentation

## 2014-04-16 DIAGNOSIS — I10 Essential (primary) hypertension: Secondary | ICD-10-CM | POA: Diagnosis present

## 2014-04-16 DIAGNOSIS — Z96659 Presence of unspecified artificial knee joint: Secondary | ICD-10-CM | POA: Insufficient documentation

## 2014-04-16 DIAGNOSIS — I252 Old myocardial infarction: Secondary | ICD-10-CM | POA: Insufficient documentation

## 2014-04-16 DIAGNOSIS — J189 Pneumonia, unspecified organism: Principal | ICD-10-CM | POA: Diagnosis present

## 2014-04-16 DIAGNOSIS — Z794 Long term (current) use of insulin: Secondary | ICD-10-CM | POA: Insufficient documentation

## 2014-04-16 DIAGNOSIS — Z8673 Personal history of transient ischemic attack (TIA), and cerebral infarction without residual deficits: Secondary | ICD-10-CM | POA: Insufficient documentation

## 2014-04-16 DIAGNOSIS — I200 Unstable angina: Secondary | ICD-10-CM

## 2014-04-16 DIAGNOSIS — I2510 Atherosclerotic heart disease of native coronary artery without angina pectoris: Secondary | ICD-10-CM | POA: Diagnosis not present

## 2014-04-16 DIAGNOSIS — E119 Type 2 diabetes mellitus without complications: Secondary | ICD-10-CM

## 2014-04-16 DIAGNOSIS — E785 Hyperlipidemia, unspecified: Secondary | ICD-10-CM

## 2014-04-16 LAB — CBG MONITORING, ED: Glucose-Capillary: 211 mg/dL — ABNORMAL HIGH (ref 70–99)

## 2014-04-16 LAB — I-STAT TROPONIN, ED: Troponin i, poc: 0 ng/mL (ref 0.00–0.08)

## 2014-04-16 LAB — CBC
HCT: 38.9 % — ABNORMAL LOW (ref 39.0–52.0)
Hemoglobin: 13.2 g/dL (ref 13.0–17.0)
MCH: 29.1 pg (ref 26.0–34.0)
MCHC: 33.9 g/dL (ref 30.0–36.0)
MCV: 85.7 fL (ref 78.0–100.0)
Platelets: 212 10*3/uL (ref 150–400)
RBC: 4.54 MIL/uL (ref 4.22–5.81)
RDW: 13 % (ref 11.5–15.5)
WBC: 6.3 10*3/uL (ref 4.0–10.5)

## 2014-04-16 LAB — STREP PNEUMONIAE URINARY ANTIGEN: STREP PNEUMO URINARY ANTIGEN: NEGATIVE

## 2014-04-16 LAB — BASIC METABOLIC PANEL
BUN: 10 mg/dL (ref 6–23)
CO2: 26 mEq/L (ref 19–32)
Calcium: 8.8 mg/dL (ref 8.4–10.5)
Chloride: 102 mEq/L (ref 96–112)
Creatinine, Ser: 0.94 mg/dL (ref 0.50–1.35)
GFR calc Af Amer: >90 mL/min (ref >90)
GFR calc non Af Amer: 82 mL/min — ABNORMAL LOW (ref >90)
Glucose, Bld: 246 mg/dL — ABNORMAL HIGH (ref 70–99)
Potassium: 3.8 mEq/L (ref 3.7–5.3)
Sodium: 140 mEq/L (ref 137–147)

## 2014-04-16 LAB — EXPECTORATED SPUTUM ASSESSMENT W REFEX TO RESP CULTURE

## 2014-04-16 LAB — GLUCOSE, CAPILLARY
GLUCOSE-CAPILLARY: 259 mg/dL — AB (ref 70–99)
Glucose-Capillary: 248 mg/dL — ABNORMAL HIGH (ref 70–99)

## 2014-04-16 LAB — EXPECTORATED SPUTUM ASSESSMENT W GRAM STAIN, RFLX TO RESP C

## 2014-04-16 LAB — PRO B NATRIURETIC PEPTIDE: Pro B Natriuretic peptide (BNP): 40.8 pg/mL (ref 0–125)

## 2014-04-16 MED ORDER — GUAIFENESIN-CODEINE 100-10 MG/5ML PO SOLN
10.0000 mL | Freq: Once | ORAL | Status: AC
  Administered 2014-04-16: 10 mL via ORAL
  Filled 2014-04-16: qty 10

## 2014-04-16 MED ORDER — HYDROCODONE-ACETAMINOPHEN 5-325 MG PO TABS
1.0000 | ORAL_TABLET | Freq: Four times a day (QID) | ORAL | Status: DC | PRN
  Administered 2014-04-16: 1 via ORAL
  Filled 2014-04-16 (×2): qty 1

## 2014-04-16 MED ORDER — ASPIRIN EC 81 MG PO TBEC
81.0000 mg | DELAYED_RELEASE_TABLET | Freq: Every day | ORAL | Status: DC
  Administered 2014-04-17: 81 mg via ORAL
  Filled 2014-04-16: qty 1

## 2014-04-16 MED ORDER — DEXTROSE 5 % IV SOLN
500.0000 mg | Freq: Once | INTRAVENOUS | Status: AC
  Administered 2014-04-16: 500 mg via INTRAVENOUS
  Filled 2014-04-16: qty 500

## 2014-04-16 MED ORDER — GABAPENTIN 100 MG PO CAPS
100.0000 mg | ORAL_CAPSULE | Freq: Three times a day (TID) | ORAL | Status: DC
  Administered 2014-04-16 – 2014-04-17 (×2): 100 mg via ORAL
  Filled 2014-04-16 (×4): qty 1

## 2014-04-16 MED ORDER — INSULIN ASPART 100 UNIT/ML ~~LOC~~ SOLN
0.0000 [IU] | Freq: Every day | SUBCUTANEOUS | Status: DC
  Administered 2014-04-16: 3 [IU] via SUBCUTANEOUS

## 2014-04-16 MED ORDER — INSULIN DETEMIR 100 UNIT/ML ~~LOC~~ SOLN
80.0000 [IU] | Freq: Every day | SUBCUTANEOUS | Status: DC
Start: 2014-04-16 — End: 2014-04-17
  Administered 2014-04-16: 80 [IU] via SUBCUTANEOUS
  Filled 2014-04-16 (×2): qty 0.8

## 2014-04-16 MED ORDER — INSULIN ASPART 100 UNIT/ML ~~LOC~~ SOLN
30.0000 [IU] | Freq: Two times a day (BID) | SUBCUTANEOUS | Status: DC
  Administered 2014-04-17: 30 [IU] via SUBCUTANEOUS

## 2014-04-16 MED ORDER — INSULIN ASPART 100 UNIT/ML ~~LOC~~ SOLN
0.0000 [IU] | Freq: Three times a day (TID) | SUBCUTANEOUS | Status: DC
  Administered 2014-04-16: 5 [IU] via SUBCUTANEOUS
  Administered 2014-04-17: 2 [IU] via SUBCUTANEOUS

## 2014-04-16 MED ORDER — DEXTROSE 5 % IV SOLN
1.0000 g | INTRAVENOUS | Status: DC
  Filled 2014-04-16: qty 10

## 2014-04-16 MED ORDER — LISINOPRIL 20 MG PO TABS
20.0000 mg | ORAL_TABLET | Freq: Every day | ORAL | Status: DC
  Administered 2014-04-17: 20 mg via ORAL
  Filled 2014-04-16: qty 1

## 2014-04-16 MED ORDER — AMLODIPINE BESYLATE 10 MG PO TABS
10.0000 mg | ORAL_TABLET | Freq: Every day | ORAL | Status: DC
  Administered 2014-04-17: 10 mg via ORAL
  Filled 2014-04-16: qty 1

## 2014-04-16 MED ORDER — PREDNISOLONE ACETATE 1 % OP SUSP
1.0000 [drp] | Freq: Four times a day (QID) | OPHTHALMIC | Status: DC
  Administered 2014-04-16 – 2014-04-17 (×3): 1 [drp] via OPHTHALMIC
  Filled 2014-04-16: qty 1

## 2014-04-16 MED ORDER — DEXTROSE 5 % IV SOLN
500.0000 mg | INTRAVENOUS | Status: DC
  Filled 2014-04-16: qty 500

## 2014-04-16 MED ORDER — ATORVASTATIN CALCIUM 40 MG PO TABS
40.0000 mg | ORAL_TABLET | Freq: Every day | ORAL | Status: DC
  Filled 2014-04-16: qty 1

## 2014-04-16 MED ORDER — DEXTROSE 5 % IV SOLN: 500.0000 mg | INTRAVENOUS | Status: DC

## 2014-04-16 MED ORDER — METOPROLOL TARTRATE 50 MG PO TABS
50.0000 mg | ORAL_TABLET | Freq: Two times a day (BID) | ORAL | Status: DC
  Administered 2014-04-16 – 2014-04-17 (×2): 50 mg via ORAL
  Filled 2014-04-16 (×3): qty 1

## 2014-04-16 MED ORDER — PANTOPRAZOLE SODIUM 40 MG PO TBEC
40.0000 mg | DELAYED_RELEASE_TABLET | Freq: Every day | ORAL | Status: DC
  Filled 2014-04-16: qty 1

## 2014-04-16 MED ORDER — INSULIN SYRINGES (DISPOSABLE) U-100 1 ML MISC: 60.0000 [IU] | Freq: Four times a day (QID) | Status: DC

## 2014-04-16 MED ORDER — CLONAZEPAM 1 MG PO TABS: 1.0000 mg | ORAL_TABLET | Freq: Every evening | ORAL | Status: DC | PRN

## 2014-04-16 MED ORDER — ENOXAPARIN SODIUM 40 MG/0.4ML ~~LOC~~ SOLN
40.0000 mg | SUBCUTANEOUS | Status: DC
  Administered 2014-04-16: 40 mg via SUBCUTANEOUS
  Filled 2014-04-16 (×2): qty 0.4

## 2014-04-16 MED ORDER — DEXTROSE 5 % IV SOLN
1.0000 g | Freq: Once | INTRAVENOUS | Status: AC
  Administered 2014-04-16: 1 g via INTRAVENOUS
  Filled 2014-04-16: qty 10

## 2014-04-16 NOTE — ED Provider Notes (Signed)
CSN: [id number]     Arrival date & time 04/16/14  1237 History   First MD Initiated Contact with Patient 04/16/14 1258     Chief Complaint  Patient presents with  . Chest Pain  . Shortness of Breath    (Consider location/radiation/quality/duration/timing/severity/associated sxs/prior Treatment) HPI  72yM with cough and dyspnea. Onset about 2 days ago. Constant. Dyspnea worse with exertion. Denies CP. No unusual leg pain or swelling. Cough is nonproductive.  No hx of PE/DVT. No recent procedures or immobilization.   Past Medical History  Diagnosis Date  . Hypertension   . Arthritis   . CAD (coronary artery disease)     a. Pt reports "small heart attack" in 2013 at [facility], denies need for stent or CABG, does not know findings of cath.  . Pneumonia   . TIA (transient ischemic attack) 2002  . Dyslipidemia, goal LDL below 70 01/29/2014  . DM type 2 (diabetes mellitus, type 2), insulin dependent 01/29/2014  . Heart attack 02/03/2014    "mild heart attack"  . GERD (gastroesophageal reflux disease)   . Enlarged prostate    Past Surgical History  Procedure Laterality Date  . Total knee arthroplasty Bilateral   . Cardiac catheterization  01/28/14    + CAD treat medically  . Lumbar disc surgery    . Cataract extraction Bilateral    Family History  Problem Relation Age of Onset  . CAD Brother     2 brothers - CABG  . Alzheimer's disease Sister   . CAD Sister   . Prostate cancer Brother   . Diabetes Other     entire family   History  Substance Use Topics  . Smoking status: Never Smoker   . Smokeless tobacco: Never Used  . Alcohol Use: No    Review of Systems  All systems reviewed and negative, other than as noted in HPI.   Allergies  Review of patient's allergies indicates no known allergies.  Home Medications   Prior to Admission medications   Medication Sig Start Date End Date Taking? Authorizing Provider  acetaminophen (TYLENOL) 325 MG tablet  Take 2 tablets (650 mg total) by mouth every 6 (six) hours as needed for moderate pain. 01/29/14  Yes Cecilie Kicks, NP  amLODipine (NORVASC) 10 MG tablet Take 1 tablet (10 mg total) by mouth daily. 01/29/14  Yes Cecilie Kicks, NP  aspirin EC 81 MG tablet Take 81 mg by mouth daily.   Yes Historical Provider, MD  clonazePAM (KLONOPIN) 1 MG tablet Take 1 tablet (1 mg total) by mouth at bedtime as needed for anxiety. 01/29/14  Yes Cecilie Kicks, NP  eplerenone (INSPRA) 25 MG tablet Take 1 tablet (25 mg total) by mouth daily. 01/29/14  Yes Cecilie Kicks, NP  gabapentin (NEURONTIN) 100 MG capsule Take 100 mg by mouth 3 (three) times daily.   Yes Historical Provider, MD  insulin aspart (NOVOLOG) 100 UNIT/ML injection Inject 30 Units into the skin 2 (two) times daily with breakfast and lunch.  02/21/14  Yes Philemon Kingdom, MD  insulin detemir (LEVEMIR) 100 UNIT/ML injection Inject 80 Units into the skin at bedtime.   Yes Historical Provider, MD  lisinopril (PRINIVIL,ZESTRIL) 20 MG tablet Take 1 tablet (20 mg total) by mouth daily. 02/24/14  Yes Imogene Burn, PA-C  metoprolol (LOPRESSOR) 50 MG tablet Take 1 tablet (50 mg total) by mouth 2 (two) times daily. 01/29/14  Yes Cecilie Kicks, NP  nitroGLYCERIN (NITROSTAT) 0.4 MG SL tablet Place 1 tablet (  0.4 mg total) under the tongue every 5 (five) minutes as needed for chest pain (CP or SOB). 01/29/14  Yes Cecilie Kicks, NP  pantoprazole (PROTONIX) 40 MG tablet Take 1 tablet (40 mg total) by mouth daily at 6 (six) AM. 01/29/14  Yes Cecilie Kicks, NP  prednisoLONE acetate (PRED FORTE) 1 % ophthalmic suspension Place 1 drop into both eyes 4 (four) times daily.   Yes Historical Provider, MD  rosuvastatin (CRESTOR) 20 MG tablet Take 1 tablet (20 mg total) by mouth daily. 02/24/14  Yes Imogene Burn, PA-C  Blood Glucose Monitoring Suppl (ONE TOUCH ULTRA SYSTEM KIT) W/DEVICE KIT Use to test blood sugar daily as instructed. Dx code: 250.00 02/21/14   Philemon Kingdom, MD  glucose blood  (BL TEST STRIP PACK) test strip Use to test blood sugar 4 times daily as instructed. Dx code: 250.00 02/21/14   Philemon Kingdom, MD  Insulin Syringes, Disposable, U-100 1 ML MISC 60 Units by Does not apply route 4 (four) times daily. 01/29/14   Cecilie Kicks, NP  Lancets [facility] ULTRASOFT) lancets Use to test blood sugar 4 times daily as instructed. Dx code: 250.00 02/21/14   Philemon Kingdom, MD   BP 142/55  Pulse 71  Temp(Src) 98.2 F (36.8 C) (Oral)  Resp 18  Ht _0  (1.727 m)  Wt 264 lb (119.75 kg)  BMI 40.15 kg/m2  SpO2 99% Physical Exam  Nursing note and vitals reviewed. Constitutional: He appears well-developed and well-nourished. No distress.  HENT:  Head: Normocephalic and atraumatic.  Eyes: Conjunctivae are normal. Right eye exhibits no discharge. Left eye exhibits no discharge.  Neck: Neck supple.  Cardiovascular: Normal rate, regular rhythm and normal heart sounds.  Exam reveals no gallop and no friction rub.   No murmur heard. Pulmonary/Chest: Effort normal and breath sounds normal. No respiratory distress.  Abdominal: Soft. He exhibits no distension. There is no tenderness.  Musculoskeletal: He exhibits edema. He exhibits no tenderness.  Lower extremities symmetric as compared to each other. No calf tenderness. Negative Homan's. No palpable cords.   Neurological: He is alert.  Skin: Skin is warm and dry.  Psychiatric: He has a normal mood and affect. His behavior is normal. Thought content normal.    ED Course  Procedures (including critical care time) Labs Review Labs Reviewed  BASIC METABOLIC PANEL - Abnormal; Notable for the following:    Glucose, Bld 246 (*)    GFR calc non Af Amer 82 (*)    All other components within normal limits  CBC - Abnormal; Notable for the following:    HCT 38.9 (*)    All other components within normal limits  CBG MONITORING, ED - Abnormal; Notable for the following:    Glucose-Capillary 211 (*)    All other components within  normal limits  PRO B NATRIURETIC PEPTIDE  I-STAT TROPOININ, ED    Imaging Review Dg Chest 2 View  04/16/2014   CLINICAL DATA:  Chest pain and shortness of breath with productive cough for 2 weeks. ; history of diabetes  EXAM: CHEST  2 VIEW  COMPARISON:  Portable chest x-ray of January 27, 2014  FINDINGS: The lungs are hypoinflated and exhibit mild stable increased lung markings. A subtle air bronchogram is noted in the left lower lobe. The cardiac silhouette is mildly enlarged. The pulmonary vascularity is normal. There is no pleural effusion. The bony thorax exhibits no acute abnormality.  IMPRESSION: There is early pneumonia in the left lower lobe. Followup films following therapy  are recommended to assure clearing.   Electronically Signed   By: David  Martinique   On: 04/16/2014 14:06     EKG Interpretation   Date/Time:  Wednesday April 16 2014 12:41:31 EDT Ventricular Rate:  72 PR Interval:  232 QRS Duration: 92 QT Interval:  412 QTC Calculation: 451 R Axis:   53 Text Interpretation:  Sinus rhythm with 1st degree A-V block Septal  infarct , age undetermined has not changed Confirmed by Wilson Singer  MD, [place]  [phone]) on 04/16/2014 2:02:42 PM      MDM   Final diagnoses:  CAP (community acquired pneumonia)    73 year old male with dyspnea and cough. Chest shows consistent with pneumonia. Pt looks well. No hypoxia. Extremely anxious about being discharged though. With his age, I think reasonable to admit for observation. Appreciate Hospitalist team's help.    Virgel Manifold, MD 04/18/14 1045

## 2014-04-16 NOTE — H&P (Signed)
[facility] History and Physical  Edward [name] CHE:[id number] DOB: Sep 10, 1941 DOA: 04/16/2014  Referring physician: er PCP: Garnet Koyanagi, DO   Chief Complaint: not feeling well  HPI: Edward [name] is a 73 y.o. male who recently moved to [place] from [place]. He was sick a couple a weeks ago. Started feeling better until last night when he developed chills and cough while at bible study.  He thought he had a PNA as he has before.  No chest pain.  Occasional nausea but no vomiting  In the ER, x ray showed early pna.  Patient lives alone and was not comfortable being d/c'd so we were called for admission.   Patient is active at home as he does aerobic exercise daily in his pool.   Review of Systems:  All systems reviewed, negative unless stated above  Past Medical History  Diagnosis Date  . Hypertension   . Arthritis   . CAD (coronary artery disease)     a. Pt reports "small heart attack" in 2013 at [facility], denies need for stent or CABG, does not know findings of cath.  . Pneumonia   . TIA (transient ischemic attack) 2002  . Dyslipidemia, goal LDL below 70 01/29/2014  . DM type 2 (diabetes mellitus, type 2), insulin dependent 01/29/2014  . Heart attack 02/03/2014    "mild heart attack"  . GERD (gastroesophageal reflux disease)   . Enlarged prostate    Past Surgical History  Procedure Laterality Date  . Total knee arthroplasty Bilateral   . Cardiac catheterization  01/28/14    + CAD treat medically  . Lumbar disc surgery    . Cataract extraction Bilateral    Social History:  reports that he has never smoked. He has never used smokeless tobacco. He reports that he does not drink alcohol or use illicit drugs.  No Known Allergies  Family History  Problem Relation Age of Onset  . CAD Brother     2 brothers - CABG  . Alzheimer's disease Sister   . CAD Sister   . Prostate cancer Brother   . Diabetes Other     entire family     Prior to Admission  medications   Medication Sig Start Date End Date Taking? Authorizing Provider  acetaminophen (TYLENOL) 325 MG tablet Take 2 tablets (650 mg total) by mouth every 6 (six) hours as needed for moderate pain. 01/29/14  Yes Cecilie Kicks, NP  amLODipine (NORVASC) 10 MG tablet Take 1 tablet (10 mg total) by mouth daily. 01/29/14  Yes Cecilie Kicks, NP  aspirin EC 81 MG tablet Take 81 mg by mouth daily.   Yes Historical Provider, MD  clonazePAM (KLONOPIN) 1 MG tablet Take 1 tablet (1 mg total) by mouth at bedtime as needed for anxiety. 01/29/14  Yes Cecilie Kicks, NP  eplerenone (INSPRA) 25 MG tablet Take 1 tablet (25 mg total) by mouth daily. 01/29/14  Yes Cecilie Kicks, NP  gabapentin (NEURONTIN) 100 MG capsule Take 100 mg by mouth 3 (three) times daily.   Yes Historical Provider, MD  insulin aspart (NOVOLOG) 100 UNIT/ML injection Inject 30 Units into the skin 2 (two) times daily with breakfast and lunch.  02/21/14  Yes Philemon Kingdom, MD  insulin detemir (LEVEMIR) 100 UNIT/ML injection Inject 80 Units into the skin at bedtime.   Yes Historical Provider, MD  lisinopril (PRINIVIL,ZESTRIL) 20 MG tablet Take 1 tablet (20 mg total) by mouth daily. 02/24/14  Yes Imogene Burn, PA-C  metoprolol (  LOPRESSOR) 50 MG tablet Take 1 tablet (50 mg total) by mouth 2 (two) times daily. 01/29/14  Yes Cecilie Kicks, NP  nitroGLYCERIN (NITROSTAT) 0.4 MG SL tablet Place 1 tablet (0.4 mg total) under the tongue every 5 (five) minutes as needed for chest pain (CP or SOB). 01/29/14  Yes Cecilie Kicks, NP  pantoprazole (PROTONIX) 40 MG tablet Take 1 tablet (40 mg total) by mouth daily at 6 (six) AM. 01/29/14  Yes Cecilie Kicks, NP  prednisoLONE acetate (PRED FORTE) 1 % ophthalmic suspension Place 1 drop into both eyes 4 (four) times daily.   Yes Historical Provider, MD  rosuvastatin (CRESTOR) 20 MG tablet Take 1 tablet (20 mg total) by mouth daily. 02/24/14  Yes Imogene Burn, PA-C  Blood Glucose Monitoring Suppl (ONE TOUCH ULTRA SYSTEM KIT)  W/DEVICE KIT Use to test blood sugar daily as instructed. Dx code: 250.00 02/21/14   Philemon Kingdom, MD  glucose blood (BL TEST STRIP PACK) test strip Use to test blood sugar 4 times daily as instructed. Dx code: 250.00 02/21/14   Philemon Kingdom, MD  Insulin Syringes, Disposable, U-100 1 ML MISC 60 Units by Does not apply route 4 (four) times daily. 01/29/14   Cecilie Kicks, NP  Lancets [facility] ULTRASOFT) lancets Use to test blood sugar 4 times daily as instructed. Dx code: 250.00 02/21/14   Philemon Kingdom, MD   Physical Exam: Filed Vitals:   04/16/14 1500  BP: 139/56  Pulse: 76  Temp:   Resp: 15    BP 139/56  Pulse 76  Temp(Src) 98.2 F (36.8 C) (Oral)  Resp 15  Ht '5\' 8"'  (1.727 m)  Wt 119.75 kg (264 lb)  BMI 40.15 kg/m2  SpO2 97%  General:  Appears calm and comfortable- not requiring O2 Eyes: PERRL, normal lids, irises & conjunctiva ENT: grossly normal hearing, lips & tongue Neck: no LAD, masses or thyromegaly Cardiovascular: RRR, no m/r/g. No LE edema. Telemetry: SR, no arrhythmias  Respiratory: CTA bilaterally, no w/r/r. Normal respiratory effort. Abdomen: soft, ntnd Skin: no rash or induration seen on limited exam Musculoskeletal: grossly normal tone BUE/BLE Psychiatric: grossly normal mood and affect, speech fluent and appropriate Neurologic: grossly non-focal.          Labs on Admission:  Basic Metabolic Panel:  Recent Labs Lab 04/16/14 1310  NA 140  K 3.8  CL 102  CO2 26  GLUCOSE 246*  BUN 10  CREATININE 0.94  CALCIUM 8.8   Liver Function Tests: No results found for this basename: AST, ALT, ALKPHOS, BILITOT, PROT, ALBUMIN,  in the last 168 hours No results found for this basename: LIPASE, AMYLASE,  in the last 168 hours No results found for this basename: AMMONIA,  in the last 168 hours CBC:  Recent Labs Lab 04/16/14 1310  WBC 6.3  HGB 13.2  HCT 38.9*  MCV 85.7  PLT 212   Cardiac Enzymes: No results found for this basename: CKTOTAL, CKMB,  CKMBINDEX, TROPONINI,  in the last 168 hours  BNP (last 3 results)  Recent Labs  04/16/14 1310  PROBNP 40.8   CBG:  Recent Labs Lab 04/16/14 1414  GLUCAP 211*    Radiological Exams on Admission: Dg Chest 2 View  04/16/2014   CLINICAL DATA:  Chest pain and shortness of breath with productive cough for 2 weeks. ; history of diabetes  EXAM: CHEST  2 VIEW  COMPARISON:  Portable chest x-ray of January 27, 2014  FINDINGS: The lungs are hypoinflated and exhibit mild stable increased  lung markings. A subtle air bronchogram is noted in the left lower lobe. The cardiac silhouette is mildly enlarged. The pulmonary vascularity is normal. There is no pleural effusion. The bony thorax exhibits no acute abnormality.  IMPRESSION: There is early pneumonia in the left lower lobe. Followup films following therapy are recommended to assure clearing.   Electronically Signed   By: David  Martinique   On: 04/16/2014 14:06    EKG: Independently reviewed. sinus  Assessment/Plan Active Problems:   DM type 2 (diabetes mellitus, type 2), insulin dependent   HTN (hypertension)   Severe obesity (BMI >= 40)   CAP (community acquired pneumonia)   Will obs overnight for IV abx as nauseous.  Will ambulate in AM to assess for O2 requirements.  Plan for d/c in AM if able to tolerate PO Continue home meds for DM    Code Status: full Family Communication: patient Disposition Plan: home in AM  Time spent: 65 min  Eulogio Bear [facility] Pager [phone]  **Disclaimer: This note may have been dictated with voice recognition software. Similar sounding words can inadvertently be transcribed and this note may contain transcription errors which may not have been corrected upon publication of note.**

## 2014-04-16 NOTE — Progress Notes (Signed)
NURSING PROGRESS NOTE  Edward [name] [id number] Admission Data: 04/16/2014 8:01 PM Attending Provider: Geradine Girt, DO BFX:OVANVB Etter Sjogren, DO Code Status: FULL  Edward [name] is a 73 y.o. male patient admitted from ED:  -No acute distress noted.  -No complaints of shortness of breath.  -No complaints of chest pain.    Blood pressure 149/65, pulse 76, temperature 98.2 F (36.8 C), temperature source Oral, resp. rate 16, height 5\' 8"  (1.727 m), weight 119.75 kg (264 lb), SpO2 99.00%.   IV Fluids:  IV in place, occlusive dsg intact without redness, IV cath forearm left, condition patent and no redness   Allergies:  Review of patient's allergies indicates no known allergies.  Past Medical History:   has a past medical history of Hypertension; Arthritis; CAD (coronary artery disease); Pneumonia; TIA (transient ischemic attack) (2002); Dyslipidemia, goal LDL below 70 (01/29/2014); DM type 2 (diabetes mellitus, type 2), insulin dependent (01/29/2014); Heart attack (02/03/2014); GERD (gastroesophageal reflux disease); and Enlarged prostate.  Past Surgical History:   has past surgical history that includes Total knee arthroplasty (Bilateral); Cardiac catheterization (01/28/14); Lumbar disc surgery; and Cataract extraction (Bilateral).   Skin: skin intact  Patient/Family orientated to room. Information packet given to patient/family. Admission inpatient armband information verified with patient/family to include name and date of birth and placed on patient arm. Side rails up x 2, fall assessment and education completed with patient/family. Patient/family able to verbalize understanding of risk associated with falls and verbalized understanding to call for assistance before getting out of bed. Call light within reach. Patient/family able to voice and demonstrate understanding of unit orientation instructions.    Will continue to evaluate and treat per MD orders.  Wallie Renshaw, RN

## 2014-04-16 NOTE — ED Notes (Signed)
Pt reports chest pains and sob x 2 days. Pt reports having a chest cold and thinks possible pneumonia. Denies cough but having sob that increases when lying down at night. Denies swelling to extremities. ekg done at triage, airway intact.

## 2014-04-16 NOTE — ED Notes (Signed)
No blood cultures to be drawn prior to antibiotics per Dr Wilson Singer.

## 2014-04-16 NOTE — ED Notes (Signed)
Pt. Also reports having lt. Groin pain intermittent, he describes as muscle cramping

## 2014-04-17 ENCOUNTER — Telehealth: Payer: Self-pay

## 2014-04-17 DIAGNOSIS — J189 Pneumonia, unspecified organism: Secondary | ICD-10-CM | POA: Diagnosis not present

## 2014-04-17 LAB — GLUCOSE, CAPILLARY
GLUCOSE-CAPILLARY: 213 mg/dL — AB (ref 70–99)
Glucose-Capillary: 159 mg/dL — ABNORMAL HIGH (ref 70–99)
Glucose-Capillary: 164 mg/dL — ABNORMAL HIGH (ref 70–99)

## 2014-04-17 LAB — LEGIONELLA ANTIGEN, URINE: LEGIONELLA ANTIGEN, URINE: NEGATIVE

## 2014-04-17 LAB — HIV ANTIBODY (ROUTINE TESTING W REFLEX): HIV: NONREACTIVE

## 2014-04-17 MED ORDER — LEVOFLOXACIN 750 MG PO TABS: 750.0000 mg | ORAL_TABLET | Freq: Every day | ORAL | Status: DC

## 2014-04-17 MED ORDER — PANTOPRAZOLE SODIUM 40 MG PO TBEC: 40.0000 mg | DELAYED_RELEASE_TABLET | Freq: Every day | ORAL | Status: DC

## 2014-04-17 MED ORDER — BENZONATATE 200 MG PO CAPS: 200.0000 mg | ORAL_CAPSULE | Freq: Three times a day (TID) | ORAL | Status: DC | PRN

## 2014-04-17 MED ORDER — GUAIFENESIN ER 600 MG PO TB12: 600.0000 mg | ORAL_TABLET | Freq: Two times a day (BID) | ORAL | Status: DC

## 2014-04-17 MED ORDER — PANTOPRAZOLE SODIUM 40 MG PO TBEC
40.0000 mg | DELAYED_RELEASE_TABLET | Freq: Every day | ORAL | Status: DC
  Administered 2014-04-17: 40 mg via ORAL

## 2014-04-17 NOTE — Discharge Summary (Signed)
PATIENT DETAILS Name: Edward [name] Age: 73 y.o. Sex: male Date of Birth: 1941-10-06 MRN: [id number]. Admit Date: 04/16/2014 Admitting Physician: Geradine Girt, DO PJK:[id number] Etter Sjogren, DO  Recommendations for Outpatient Follow-up:  1. Please repeat Chest Xray in 6-8 weeks 2. Please follow Blood cultures-not resulted at the time of discharge.  PRIMARY DISCHARGE DIAGNOSIS:  Active Problems:   DM type 2 (diabetes mellitus, type 2), insulin dependent   HTN (hypertension)   Severe obesity (BMI >= 40)   CAP (community acquired pneumonia)      PAST MEDICAL HISTORY: Past Medical History  Diagnosis Date  . Hypertension   . Arthritis   . CAD (coronary artery disease)     a. Pt reports "small heart attack" in 2013 at [facility], denies need for stent or CABG, does not know findings of cath.  . Pneumonia   . TIA (transient ischemic attack) 2002  . Dyslipidemia, goal LDL below 70 01/29/2014  . DM type 2 (diabetes mellitus, type 2), insulin dependent 01/29/2014  . Heart attack 02/03/2014    "mild heart attack"  . GERD (gastroesophageal reflux disease)   . Enlarged prostate     DISCHARGE MEDICATIONS:   Medication List         acetaminophen 325 MG tablet  Commonly known as:  TYLENOL  Take 2 tablets (650 mg total) by mouth every 6 (six) hours as needed for moderate pain.     amLODipine 10 MG tablet  Commonly known as:  NORVASC  Take 1 tablet (10 mg total) by mouth daily.     aspirin EC 81 MG tablet  Take 81 mg by mouth daily.     benzonatate 200 MG capsule  Commonly known as:  TESSALON  Take 1 capsule (200 mg total) by mouth 3 (three) times daily as needed for cough.     clonazePAM 1 MG tablet  Commonly known as:  KLONOPIN  Take 1 tablet (1 mg total) by mouth at bedtime as needed for anxiety.     eplerenone 25 MG tablet  Commonly known as:  INSPRA  Take 1 tablet (25 mg total) by mouth daily.     gabapentin 100 MG capsule  Commonly known as:  NEURONTIN    Take 100 mg by mouth 3 (three) times daily.     glucose blood test strip  Commonly known as:  BL TEST STRIP PACK  Use to test blood sugar 4 times daily as instructed. Dx code: 250.00     guaiFENesin 600 MG 12 hr tablet  Commonly known as:  MUCINEX  Take 1 tablet (600 mg total) by mouth 2 (two) times daily.     insulin aspart 100 UNIT/ML injection  Commonly known as:  novoLOG  Inject 30 Units into the skin 2 (two) times daily with breakfast and lunch.     insulin detemir 100 UNIT/ML injection  Commonly known as:  LEVEMIR  Inject 80 Units into the skin at bedtime.     Insulin Syringes (Disposable) U-100 1 ML Misc  60 Units by Does not apply route 4 (four) times daily.     levofloxacin 750 MG tablet  Commonly known as:  LEVAQUIN  Take 1 tablet (750 mg total) by mouth daily.     lisinopril 20 MG tablet  Commonly known as:  PRINIVIL,ZESTRIL  Take 1 tablet (20 mg total) by mouth daily.     metoprolol 50 MG tablet  Commonly known as:  LOPRESSOR  Take 1 tablet (50 mg  total) by mouth 2 (two) times daily.     nitroGLYCERIN 0.4 MG SL tablet  Commonly known as:  NITROSTAT  Place 1 tablet (0.4 mg total) under the tongue every 5 (five) minutes as needed for chest pain (CP or SOB).     ONE TOUCH ULTRA SYSTEM KIT W/DEVICE Kit  Use to test blood sugar daily as instructed. Dx code: 250.00     onetouch ultrasoft lancets  Use to test blood sugar 4 times daily as instructed. Dx code: 250.00     pantoprazole 40 MG tablet  Commonly known as:  PROTONIX  Take 1 tablet (40 mg total) by mouth daily at 6 (six) AM.     prednisoLONE acetate 1 % ophthalmic suspension  Commonly known as:  PRED FORTE  Place 1 drop into both eyes 4 (four) times daily.     rosuvastatin 20 MG tablet  Commonly known as:  CRESTOR  Take 1 tablet (20 mg total) by mouth daily.        ALLERGIES:  No Known Allergies  BRIEF HPI:  See H&P, Labs, Consult and Test reports for all details in brief, patient is a 73 yo  male who presented to the hospital with chills, cough. In the ER, x ray showed early pna.  CONSULTATIONS:   None  PERTINENT RADIOLOGIC STUDIES: Dg Chest 2 View  04/16/2014   CLINICAL DATA:  Chest pain and shortness of breath with productive cough for 2 weeks. ; history of diabetes  EXAM: CHEST  2 VIEW  COMPARISON:  Portable chest x-ray of January 27, 2014  FINDINGS: The lungs are hypoinflated and exhibit mild stable increased lung markings. A subtle air bronchogram is noted in the left lower lobe. The cardiac silhouette is mildly enlarged. The pulmonary vascularity is normal. There is no pleural effusion. The bony thorax exhibits no acute abnormality.  IMPRESSION: There is early pneumonia in the left lower lobe. Followup films following therapy are recommended to assure clearing.   Electronically Signed   By: David  Martinique   On: 04/16/2014 14:06     PERTINENT LAB RESULTS: CBC:  Recent Labs  04/16/14 1310  WBC 6.3  HGB 13.2  HCT 38.9*  PLT 212   CMET CMP     Component Value Date/Time   NA 140 04/16/2014 1310   K 3.8 04/16/2014 1310   CL 102 04/16/2014 1310   CO2 26 04/16/2014 1310   GLUCOSE 246* 04/16/2014 1310   BUN 10 04/16/2014 1310   CREATININE 0.94 04/16/2014 1310   CALCIUM 8.8 04/16/2014 1310   PROT 5.6* 01/27/2014 2300   ALBUMIN 2.7* 01/27/2014 2300   AST 14 01/27/2014 2300   ALT 17 01/27/2014 2300   ALKPHOS 88 01/27/2014 2300   BILITOT 0.4 01/27/2014 2300   GFRNONAA 82* 04/16/2014 1310   GFRAA >90 04/16/2014 1310    GFR Estimated Creatinine Clearance: 90.7 ml/min (by C-G formula based on Cr of 0.94). No results found for this basename: LIPASE, AMYLASE,  in the last 72 hours No results found for this basename: CKTOTAL, CKMB, CKMBINDEX, TROPONINI,  in the last 72 hours No components found with this basename: POCBNP,  No results found for this basename: DDIMER,  in the last 72 hours No results found for this basename: HGBA1C,  in the last 72 hours No results found for this  basename: CHOL, HDL, LDLCALC, TRIG, CHOLHDL, LDLDIRECT,  in the last 72 hours No results found for this basename: TSH, T4TOTAL, FREET3, T3FREE, THYROIDAB,  in the  last 72 hours No results found for this basename: VITAMINB12, FOLATE, FERRITIN, TIBC, IRON, RETICCTPCT,  in the last 72 hours Coags: No results found for this basename: PT, INR,  in the last 72 hours Microbiology: Recent Results (from the past 240 hour(s))  CULTURE, EXPECTORATED SPUTUM-ASSESSMENT     Status: None   Collection Time    04/16/14 10:50 PM      Result Value Ref Range Status   Specimen Description SPUTUM   Final   Special Requests NONE   Final   Sputum evaluation     Final   Value: MICROSCOPIC FINDINGS SUGGEST THAT THIS SPECIMEN IS NOT REPRESENTATIVE OF LOWER RESPIRATORY SECRETIONS. PLEASE RECOLLECT.     NOTIFIED TO A. MUHAMMAD (RN) [id number] 04/16/2014 L. LOMAX   Report Status 04/16/2014 FINAL   Final     BRIEF HOSPITAL COURSE:  Comm Acq PNA -admitted, started on Rocephin/Zithromax-afebrile overnight. No major complaints. Vitals stable. Ambulating in room without O2, clearly can be discharged home with oral Levaquin. Needs to follow up with PCP in 1 week, needs a repeat CXR in 6-8 weeks to document resolution of PNA  Rest of his medical conditions were stable throughout this short hospital stay.  TODAY-DAY OF DISCHARGE:  Subjective:   Shaquil Aldana today has no headache,no chest abdominal pain,no new weakness tingling or numbness, feels much better wants to go home today.   Objective:   Blood pressure 132/75, pulse 53, temperature 97.5 F (36.4 C), temperature source Oral, resp. rate 16, height $RemoveBe'5\' 8"'ORQSkFpha$  (1.727 m), weight 119.75 kg (264 lb), SpO2 98.00%.  Intake/Output Summary (Last 24 hours) at 04/17/14 0915 Last data filed at 04/16/14 1800  Gross per 24 hour  Intake    222 ml  Output      0 ml  Net    222 ml   Filed Weights   04/16/14 1243  Weight: 119.75 kg (264 lb)    Exam Awake Alert, Oriented *3, No  new F.N deficits, Normal affect [place].AT,PERRAL Supple Neck,No JVD, No cervical lymphadenopathy appriciated.  Symmetrical Chest wall movement, Good air movement bilaterally, CTAB RRR,No Gallops,Rubs or new Murmurs, No Parasternal Heave +ve B.Sounds, Abd Soft, Non tender, No organomegaly appriciated, No rebound -guarding or rigidity. No Cyanosis, Clubbing or edema, No new Rash or bruise  DISCHARGE CONDITION: Stable  DISPOSITION: Home  DISCHARGE INSTRUCTIONS:    Activity:  As tolerated   Diet recommendation: Diabetic Diet Heart Healthy diet      Discharge Instructions   Call MD for:  difficulty breathing, headache or visual disturbances    Complete by:  As directed      Call MD for:  temperature >100.4    Complete by:  As directed      Diet - low sodium heart healthy    Complete by:  As directed      Increase activity slowly    Complete by:  As directed            Follow-up Information   Follow up with Garnet Koyanagi, DO. Schedule an appointment as soon as possible for a visit in 1 week.   Specialty:  Family Medicine   Contact information:   [phone] [address] [phone]      Total Time spent on discharge equals 45 minutes.  SignedOren Binet 04/17/2014 9:15 AM  **Disclaimer: This note may have been dictated with voice recognition software. Similar sounding words can inadvertently be transcribed and this note may contain transcription errors which may  not have been corrected upon publication of note.**

## 2014-04-17 NOTE — Progress Notes (Signed)
UR Completed Margretta Zamorano Graves-Bigelow, RN,BSN [phone]  

## 2014-04-17 NOTE — Progress Notes (Signed)
NURSING PROGRESS NOTE  Edward [name] [id number] Discharge Data: 04/17/2014 12:05 PM Attending Jadeyn Hargett: Jonetta Osgood, MD [id number] Etter Sjogren, DO     [name] to be D/C'd Home per MD order.  Discussed with the patient the After Visit Summary and all questions fully answered. All IV's discontinued with no bleeding noted. All belongings returned to patient for patient to take home.   Last Vital Signs:  Blood pressure 158/64, pulse 78, temperature 97.5 F (36.4 C), temperature source Oral, resp. rate 16, height '5\' 8"'  (1.727 m), weight 119.75 kg (264 lb), SpO2 98.00%.  Discharge Medication List   Medication List         acetaminophen 325 MG tablet  Commonly known as:  TYLENOL  Take 2 tablets (650 mg total) by mouth every 6 (six) hours as needed for moderate pain.     amLODipine 10 MG tablet  Commonly known as:  NORVASC  Take 1 tablet (10 mg total) by mouth daily.     aspirin EC 81 MG tablet  Take 81 mg by mouth daily.     benzonatate 200 MG capsule  Commonly known as:  TESSALON  Take 1 capsule (200 mg total) by mouth 3 (three) times daily as needed for cough.     clonazePAM 1 MG tablet  Commonly known as:  KLONOPIN  Take 1 tablet (1 mg total) by mouth at bedtime as needed for anxiety.     eplerenone 25 MG tablet  Commonly known as:  INSPRA  Take 1 tablet (25 mg total) by mouth daily.     gabapentin 100 MG capsule  Commonly known as:  NEURONTIN  Take 100 mg by mouth 3 (three) times daily.     glucose blood test strip  Commonly known as:  BL TEST STRIP PACK  Use to test blood sugar 4 times daily as instructed. Dx code: 250.00     guaiFENesin 600 MG 12 hr tablet  Commonly known as:  MUCINEX  Take 1 tablet (600 mg total) by mouth 2 (two) times daily.     insulin aspart 100 UNIT/ML injection  Commonly known as:  novoLOG  Inject 30 Units into the skin 2 (two) times daily with breakfast and lunch.     insulin detemir 100 UNIT/ML injection  Commonly known as:   LEVEMIR  Inject 80 Units into the skin at bedtime.     Insulin Syringes (Disposable) U-100 1 ML Misc  60 Units by Does not apply route 4 (four) times daily.     levofloxacin 750 MG tablet  Commonly known as:  LEVAQUIN  Take 1 tablet (750 mg total) by mouth daily.     lisinopril 20 MG tablet  Commonly known as:  PRINIVIL,ZESTRIL  Take 1 tablet (20 mg total) by mouth daily.     metoprolol 50 MG tablet  Commonly known as:  LOPRESSOR  Take 1 tablet (50 mg total) by mouth 2 (two) times daily.     nitroGLYCERIN 0.4 MG SL tablet  Commonly known as:  NITROSTAT  Place 1 tablet (0.4 mg total) under the tongue every 5 (five) minutes as needed for chest pain (CP or SOB).     ONE TOUCH ULTRA SYSTEM KIT W/DEVICE Kit  Use to test blood sugar daily as instructed. Dx code: 250.00     onetouch ultrasoft lancets  Use to test blood sugar 4 times daily as instructed. Dx code: 250.00     pantoprazole 40 MG tablet  Commonly known as:  PROTONIX  Take 1 tablet (40 mg total) by mouth daily at 6 (six) AM.     prednisoLONE acetate 1 % ophthalmic suspension  Commonly known as:  PRED FORTE  Place 1 drop into both eyes 4 (four) times daily.     rosuvastatin 20 MG tablet  Commonly known as:  CRESTOR  Take 1 tablet (20 mg total) by mouth daily.         Wallie Renshaw, RN

## 2014-04-17 NOTE — Care Management Note (Signed)
    Page 1 of 1   04/17/2014     12:14:13 PM CARE MANAGEMENT NOTE 04/17/2014  Patient:  Edward [name], Edward [name]   Account Number:  [id number]  Date Initiated:  04/17/2014  Documentation initiated by:  Tomi Bamberger  Subjective/Objective Assessment:   dx cp  admit- lives alone. pta indep.     Action/Plan:   Anticipated DC Date:  04/17/2014   Anticipated DC Plan:  [place]  CM consult      Choice offered to / List presented to:             Status of service:  Completed, signed off Medicare Important Message given?  NA - LOS <3 / Initial given by admissions (If response is "NO", the following Medicare IM given date fields will be blank) Date Medicare IM given:   Date Additional Medicare IM given:    Discharge Disposition:  HOME/SELF CARE  Per UR Regulation:  Reviewed for med. necessity/level of care/duration of stay  If discussed at [place] of Stay Meetings, dates discussed:    Comments:

## 2014-04-17 NOTE — Discharge Instructions (Signed)
Follow with Primary MD  Garnet Koyanagi, DO  and other consultant as instructed your Hospitalist MD  Please get a complete blood count and chemistry panel checked by your Primary MD at your next visit, and again as instructed by your Primary MD.  You had Pneumonia at the Hospital: Please get a 2 view Chest X ray done in 6-8 weeks after hospital discharge or sooner if instructed your Hospilatist MD at the hospital.   Get Medicines reviewed and adjusted. Please take all your medications with you for your next visit with your Primary MD  Please request your Primary MD to go over all hospital tests and procedure/radiological results at the follow up, please ask your Primary MD to get all Hospital records sent to his/her office.  If you experience worsening of your admission symptoms, develop shortness of breath, life threatening emergency, suicidal or homicidal thoughts you must seek medical attention immediately by calling 911 or calling your MD immediately  if symptoms less severe.  You must read complete instructions/literature along with all the possible adverse reactions/side effects for all the Medicines you take and that have been prescribed to you. Take any new Medicines after you have completely understood and accpet all the possible adverse reactions/side effects.   Do not drive when taking Pain medications.   Do not take more than prescribed Pain, Sleep and Anxiety Medications  Special Instructions: If you have smoked or chewed Tobacco  in the last 2 yrs please stop smoking, stop any regular Alcohol  and or any Recreational drug use.  Wear Seat belts while driving.  Please note  You were cared for by a hospitalist during your hospital stay. Once you are discharged, your primary care physician will handle any further medical issues. Please note that NO REFILLS for any discharge medications will be authorized once you are discharged, as it is imperative that you return to your primary  care physician (or establish a relationship with a primary care physician if you do not have one) for your aftercare needs so that they can reassess your need for medications and monitor your lab values.

## 2014-04-17 NOTE — Telephone Encounter (Signed)
Left message for call back. Identifiable.   

## 2014-04-21 NOTE — Telephone Encounter (Addendum)
Admitted from ED to Hospital Admission: 6/17/115 Discharged to Home:  04/17/14   Pt was admitted with cough and dyspnea.  Chest x-ray showed early pneumonia in left lower lobe.  He was admitted for observation and then discharged the next day.  Pt states that he is feeling better.  Still has a cough, but no shortness of breath or chest pain.  He is taking medication as prescribed.    Medication List and allergies:  Reviewed and updated New Medication:  Benzonatate, Levofloxacin, and Guaifenesin (Mucinex) Local prescriptions:  [id number] [place], [place] No changes to personal, family or PSH   Recommendations for Outpatient Follow-up: (per discharge summary) 1. Please repeat Chest Xray in 6-8 weeks 2. Please follow Blood cultures-not resulted at the time of discharge.   Hospital follow up scheduled for 04/24/14 @ 11:30 am with Dr. Etter Sjogren.

## 2014-04-22 ENCOUNTER — Ambulatory Visit: Payer: Medicare Other | Admitting: Interventional Cardiology

## 2014-04-22 LAB — CULTURE, BLOOD (ROUTINE X 2): Culture: NO GROWTH

## 2014-04-23 LAB — CULTURE, BLOOD (ROUTINE X 2): Culture: NO GROWTH

## 2014-04-24 ENCOUNTER — Ambulatory Visit (INDEPENDENT_AMBULATORY_CARE_PROVIDER_SITE_OTHER): Payer: Medicare Other | Admitting: Family Medicine

## 2014-04-24 ENCOUNTER — Encounter: Payer: Self-pay | Admitting: Family Medicine

## 2014-04-24 VITALS — BP 124/68 | HR 76 | Temp 98.2°F | Wt 263.0 lb

## 2014-04-24 DIAGNOSIS — J189 Pneumonia, unspecified organism: Secondary | ICD-10-CM

## 2014-04-24 DIAGNOSIS — I200 Unstable angina: Secondary | ICD-10-CM | POA: Diagnosis not present

## 2014-04-24 MED ORDER — BENZONATATE 200 MG PO CAPS: 200.0000 mg | ORAL_CAPSULE | Freq: Three times a day (TID) | ORAL | Status: DC | PRN

## 2014-04-24 NOTE — Progress Notes (Signed)
Pre visit review using our clinic review tool, if applicable. No additional management support is needed unless otherwise documented below in the visit note. 

## 2014-04-24 NOTE — Progress Notes (Signed)
Subjective:    Patient ID: Edward [name], male    DOB: 07-Mar-1941, 73 y.o.   MRN: [id number]  HPI Pt here to f/u hosp for pneumonia. Pt has no complaints and states he is feeling much better.  See d/c summary.     Review of Systems As above  Past Medical History  Diagnosis Date  . Hypertension   . Arthritis   . CAD (coronary artery disease)     a. Pt reports "small heart attack" in 2013 at [facility], denies need for stent or CABG, does not know findings of cath.  . Pneumonia 01/2013  . TIA (transient ischemic attack) 2002  . Dyslipidemia, goal LDL below 70 01/29/2014  . DM type 2 (diabetes mellitus, type 2), insulin dependent 01/29/2014  . Heart attack 02/03/2014    "mild heart attack"  . GERD (gastroesophageal reflux disease)   . Enlarged prostate   . Pneumonia 03/2014   History   Social History  . Marital Status: Married    Spouse Name: N/A    Number of Children: 0  . Years of Education: N/A   Occupational History  . retired    Social History Main Topics  . Smoking status: Never Smoker   . Smokeless tobacco: Never Used  . Alcohol Use: No  . Drug Use: No  . Sexual Activity: Not on file   Other Topics Concern  . 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   He lives alone.  His wife is living in [place] at this time.  They do not have children.   Highest level of education:  College graduate   Current Outpatient Prescriptions  Medication Sig Dispense Refill  . acetaminophen (TYLENOL) 325 MG tablet Take 2 tablets (650 mg total) by mouth every 6 (six) hours as needed for moderate pain.      Marland Kitchen amLODipine (NORVASC) 10 MG tablet Take 1 tablet (10 mg total) by mouth daily.  30 tablet  6  . aspirin EC 81 MG tablet Take 81 mg by mouth daily.      . benzonatate (TESSALON) 200 MG capsule Take 1 capsule (200 mg total) by mouth 3 (three) times daily as needed for  cough.  20 capsule  0  . Blood Glucose Monitoring Suppl (ONE TOUCH ULTRA SYSTEM KIT) W/DEVICE KIT Use to test blood sugar daily as instructed. Dx code: 250.00  1 each  0  . clonazePAM (KLONOPIN) 1 MG tablet Take 1 tablet (1 mg total) by mouth at bedtime as needed for anxiety.  30 tablet  0  . eplerenone (INSPRA) 25 MG tablet Take 1 tablet (25 mg total) by mouth daily.  30 tablet  0  . gabapentin (NEURONTIN) 100 MG capsule Take 100 mg by mouth 3 (three) times daily.      Marland Kitchen glucose blood (BL TEST STRIP PACK) test strip Use to test blood sugar 4 times daily as instructed. Dx code: 250.00  200 each  4  . guaiFENesin (MUCINEX) 600 MG 12 hr tablet Take 1 tablet (600 mg total) by mouth 2 (two) times daily.  10 tablet  0  . insulin aspart (NOVOLOG) 100 UNIT/ML injection Inject 30 Units into the skin 2 (two) times daily with breakfast and lunch.       . insulin detemir (LEVEMIR) 100 UNIT/ML injection Inject 80 Units into the skin at  bedtime.      . Insulin Syringes, Disposable, U-100 1 ML MISC 60 Units by Does not apply route 4 (four) times daily.  120 each  2  . Lancets (ONETOUCH ULTRASOFT) lancets Use to test blood sugar 4 times daily as instructed. Dx code: 250.00  200 each  4  . lisinopril (PRINIVIL,ZESTRIL) 20 MG tablet Take 1 tablet (20 mg total) by mouth daily.  30 tablet  6  . metoprolol (LOPRESSOR) 50 MG tablet Take 1 tablet (50 mg total) by mouth 2 (two) times daily.  60 tablet  6  . nitroGLYCERIN (NITROSTAT) 0.4 MG SL tablet Place 1 tablet (0.4 mg total) under the tongue every 5 (five) minutes as needed for chest pain (CP or SOB).  25 tablet  4  . pantoprazole (PROTONIX) 40 MG tablet Take 1 tablet (40 mg total) by mouth daily at 6 (six) AM.  30 tablet  2  . prednisoLONE acetate (PRED FORTE) 1 % ophthalmic suspension Place 1 drop into both eyes 4 (four) times daily.      . rosuvastatin (CRESTOR) 20 MG tablet Take 1 tablet (20 mg total) by mouth daily.  30 tablet  3   No current  facility-administered medications for this visit.       Objective:   Physical Exam  BP 124/68  Pulse 76  Temp(Src) 98.2 F (36.8 C) (Oral)  Wt 263 lb (119.296 kg)  SpO2 97% General appearance: alert, cooperative, appears stated age and no distress Neck: no adenopathy, no carotid bruit, no JVD, supple, symmetrical, trachea midline and thyroid not enlarged, symmetric, no tenderness/mass/nodules Lungs: clear to auscultation bilaterally Heart: S1, S2 normal Extremities: extremities normal, atraumatic, no cyanosis or edema       Assessment & Plan:  1. CAP (community acquired pneumonia) Check cxr in 6 weeks Pt finished abx today rto if symptoms return--  rto as scheduled - DG Chest 2 View; Future - benzonatate (TESSALON) 200 MG capsule; Take 1 capsule (200 mg total) by mouth 3 (three) times daily as needed for cough.  Dispense: 20 capsule; Refill: 0

## 2014-04-24 NOTE — Patient Instructions (Signed)

## 2014-05-05 ENCOUNTER — Telehealth: Payer: Self-pay | Admitting: *Deleted

## 2014-05-05 ENCOUNTER — Telehealth: Payer: Self-pay | Admitting: Gastroenterology

## 2014-05-05 ENCOUNTER — Encounter: Payer: Self-pay | Admitting: Family Medicine

## 2014-05-05 NOTE — Telephone Encounter (Signed)
Caller name:  Adeyemi Relation to pt:  self Call back number:  [phone] Pharmacy:  Reason for call:   Pt requesting where he is to go get Chest Xray, and if he needs an appt.  Pt prefers to get information via MyChart, but can call number above if there is an issue.

## 2014-05-05 NOTE — Telephone Encounter (Signed)
Faxed release of information to prior GI doctor. Awaiting records.

## 2014-05-07 ENCOUNTER — Telehealth: Payer: Self-pay | Admitting: *Deleted

## 2014-05-07 ENCOUNTER — Encounter (INDEPENDENT_AMBULATORY_CARE_PROVIDER_SITE_OTHER): Payer: Medicare Other | Admitting: Ophthalmology

## 2014-05-07 ENCOUNTER — Ambulatory Visit (INDEPENDENT_AMBULATORY_CARE_PROVIDER_SITE_OTHER)
Admission: RE | Admit: 2014-05-07 | Discharge: 2014-05-07 | Disposition: A | Discharge status: Home / Self Care | Payer: Medicare Other | Source: Ambulatory Visit | Attending: Family Medicine | Admitting: Family Medicine

## 2014-05-07 DIAGNOSIS — J189 Pneumonia, unspecified organism: Secondary | ICD-10-CM

## 2014-05-07 DIAGNOSIS — E11359 Type 2 diabetes mellitus with proliferative diabetic retinopathy without macular edema: Secondary | ICD-10-CM

## 2014-05-07 DIAGNOSIS — R0602 Shortness of breath: Secondary | ICD-10-CM | POA: Diagnosis not present

## 2014-05-07 DIAGNOSIS — E1139 Type 2 diabetes mellitus with other diabetic ophthalmic complication: Secondary | ICD-10-CM

## 2014-05-07 DIAGNOSIS — E1165 Type 2 diabetes mellitus with hyperglycemia: Secondary | ICD-10-CM

## 2014-05-07 NOTE — Telephone Encounter (Signed)
Pt notified of results, advised that he wants to remain local for an appt due to not wanting to travel [place] [address]. Was going to schedule pt in one of [facility]'s same day appts or with Dr. Ronnald Ramp tomorrow. Pt advises that he has been having chills and wheezing does not want to go to hospital.

## 2014-05-07 NOTE — Telephone Encounter (Signed)
Stated he was going to see if his brother could bring him to an appt due to him not feeling comfortable driving. Pt advised he was calling back. Told pt to go to ER if symptoms worsen.

## 2014-05-07 NOTE — Telephone Encounter (Signed)
Pt is wanting to know the results of xray done 1 hour ago.  Pt feels bad.  Call back at [phone].

## 2014-05-07 NOTE — Telephone Encounter (Signed)
Can you please advise on this x-ray?

## 2014-05-07 NOTE — Telephone Encounter (Signed)
Spoke with rad tech from radiology who stated that the patient had a follow up CXR today and wanted to know if he needed to go home or to the hospital. I spoke with Dr. Larose Kells who reviewed the CXR film and stated he would rather wait until the radiologist reads the films. Patient was advised of the plan and he preferred to go home and wait for [place] to call him.

## 2014-05-07 NOTE — Telephone Encounter (Signed)
Chest x-ray is clear. Is hard to say what's going on without see him. Please arrange a office visit. If he has severe symptoms: Fever, chills, chest pain, shortness of breath:  ER

## 2014-05-08 NOTE — Telephone Encounter (Signed)
Spoke with pt and he stated he feels much better today.  Let pt know that we did have some openings and we could try get him in if he needed.  Pt understood, and will call back if anything changes.

## 2014-05-12 ENCOUNTER — Other Ambulatory Visit: Payer: Self-pay | Admitting: Family Medicine

## 2014-05-12 DIAGNOSIS — J189 Pneumonia, unspecified organism: Secondary | ICD-10-CM

## 2014-05-12 MED ORDER — BENZONATATE 200 MG PO CAPS: 200.0000 mg | ORAL_CAPSULE | Freq: Three times a day (TID) | ORAL | Status: DC | PRN

## 2014-05-12 NOTE — Telephone Encounter (Signed)
Last seen and filled 04/19/14 #20. Please advise     KP

## 2014-05-15 ENCOUNTER — Telehealth: Payer: Self-pay

## 2014-05-15 NOTE — Telephone Encounter (Signed)
Informed patient of Dr. Lynne Leader recommendations. Patient states he is still having stomach pain and reflux symptoms. He states he knows it's probable because all the of the pain medicine he has been taking for his shoulder before his upcoming shoulder surgery. Told patient he may have to increase his Prilosec temporarily to twice a day dosing and re intensify ant-reflux measures during that period of time. Also told patient to call us back if he continues to have abdominal pain or reflux.

## 2014-05-15 NOTE — Telephone Encounter (Signed)
Message copied by Marzella Schlein on Thu May 15, 2014  9:13 AM ------      Message from: Lucio Edward T      Created: Wed May 14, 2014  4:54 PM       Colon recall 12/2015 and please inform pt that we received and reviewed his records from [place] and our recommendation is for a 5 year recall colonoscopy. ------

## 2014-05-16 DIAGNOSIS — M75120 Complete rotator cuff tear or rupture of unspecified shoulder, not specified as traumatic: Secondary | ICD-10-CM | POA: Diagnosis not present

## 2014-05-19 ENCOUNTER — Telehealth: Payer: Self-pay | Admitting: Family Medicine

## 2014-05-19 ENCOUNTER — Other Ambulatory Visit: Payer: Self-pay | Admitting: Cardiology

## 2014-05-19 NOTE — Telephone Encounter (Signed)
Caller name: Edward [name]  Call back number:[phone] Pharmacy:[id number], [place]   Reason for call:  Pt wants a refill on RX clonazePAM (KLONOPIN) 1 MG tablet.  He states that he never got the old phone filled, and just needs it written again.

## 2014-05-26 ENCOUNTER — Ambulatory Visit: Payer: Medicare Other | Admitting: *Deleted

## 2014-05-27 ENCOUNTER — Ambulatory Visit (INDEPENDENT_AMBULATORY_CARE_PROVIDER_SITE_OTHER): Payer: Medicare Other | Admitting: Internal Medicine

## 2014-05-27 ENCOUNTER — Other Ambulatory Visit: Payer: Self-pay | Admitting: *Deleted

## 2014-05-27 ENCOUNTER — Encounter: Payer: Self-pay | Admitting: Internal Medicine

## 2014-05-27 ENCOUNTER — Other Ambulatory Visit: Payer: Self-pay | Admitting: Internal Medicine

## 2014-05-27 VITALS — BP 144/62 | HR 71 | Temp 97.8°F | Resp 16 | Wt 264.0 lb

## 2014-05-27 DIAGNOSIS — E119 Type 2 diabetes mellitus without complications: Secondary | ICD-10-CM

## 2014-05-27 DIAGNOSIS — I200 Unstable angina: Secondary | ICD-10-CM | POA: Diagnosis not present

## 2014-05-27 DIAGNOSIS — E1159 Type 2 diabetes mellitus with other circulatory complications: Secondary | ICD-10-CM | POA: Diagnosis not present

## 2014-05-27 LAB — HEMOGLOBIN A1C
Hgb A1c MFr Bld: 10.2 % — ABNORMAL HIGH (ref <5.7)
MEAN PLASMA GLUCOSE: 246 mg/dL — AB (ref <117)

## 2014-05-27 MED ORDER — INSULIN ASPART PROT & ASPART (70-30 MIX) 100 UNIT/ML ~~LOC~~ SUSP: 60.0000 [IU] | Freq: Two times a day (BID) | SUBCUTANEOUS | Status: DC

## 2014-05-27 MED ORDER — INSULIN ASPART 100 UNIT/ML ~~LOC~~ SOLN: 20.0000 [IU] | Freq: Three times a day (TID) | SUBCUTANEOUS | Status: DC

## 2014-05-27 NOTE — Progress Notes (Signed)
Patient ID: Edward [name], male   DOB: 18-Apr-1941, 73 y.o.   MRN: [id number]  HPI: Edward [name] Edward is a 73 y.o.-year-old male, returning for f/u for DM2,  dx 1987, insulin-dependent since 1989, uncontrolled, with complications (CAD, PN). He moved from [place] in 12/2013. Last visit 1.5 mo ago.  He had steroid inj in R shoulder 2 weeks ago. He will have surgery in 07/2014.  Last hemoglobin A1c was: Lab Results  Component Value Date   HGBA1C 9.1* 01/27/2014   Pt was on a regimen of (prefers syringes, not pens): - Novolog 70/30 units 60 units 2x a day - NovoLog 20 units 2x a day He was on Metformin >> N/V/D. He ran out of insulin for a week in 12/2013.  He is now on: - restarted in 04/30/2014 (as he felt that the Levemir is not helping) 70 units  - NovoLog 20-20-20 with B-L-D  He asks me whether it is possible to switch back to 70/30 insulin.  He was on: - Levemir 75 >> 90 units at bedtime. - NovoLog: - 30 >> 40 units with breakfast - 20 >> 30  units with lunch - 30 >> 40 units with dinner But he felt that this regimen was not working.  Pt checks his sugars 4-5 a day and they are high: - am: 180-270 >> 100-331 (436) >> 128-344 - 2h after b'fast: n/c >> 200 >> 78 x1, 209-337 - before lunch: 280-310 >> 216, 294, 402 >> 179-370 - 2h after lunch: n/c >> 222, 375, 545 >> 198-309 - before dinner: 280s >> 177 >> 67 x1, 175-312 - 2h after dinner: n/c >> 301-379 - bedtime: 200-300 >> 190-463 - nighttime: n/c >> 300s Has some lows. Lowest sugar was 51 in am - after using 70/30 at night!!! - he has hypoglycemia awareness at 90  Highest sugar was 545 x 1  - cortisone.  Has a One Touch Ultra meter.   Pt's meals are: - Breakfast: eggs, oatmeal, grits, bacon - Lunch: baked chiken + vegetables - Dinner: same as lunch - Snacks: sweets - cookies Rarely fast food.  He is walking in am and swims in the swimming pool.   - no CKD, last BUN/creatinine:  Lab Results  Component Value Date   BUN  10 04/16/2014   CREATININE 0.94 04/16/2014  On Lisinopril. ACR 37.6 in 02/11/2014) - Has HL. Last set of lipids: Lab Results  Component Value Date   CHOL 151 01/28/2014   HDL 32* 01/28/2014   LDLCALC 92 01/28/2014   TRIG 134 01/28/2014   CHOLHDL 4.7 01/28/2014  Not on a statin. He gets leg cramps from Lipitor >> cannot tolerate it. - last eye exam was in 07/2013. He saw Dr Zigmund Daniel >> Had DR. Had cataracts sx. He needs palpebral sx >> insurance not covering it - + numbness and tingling in his feet >> will start neurontin. He saw podiatrist (02/2014) >> has PN >> he will get new socks.  On ASA 81.  I reviewed pt's medications, allergies, PMH, social hx, family hx and no changes required, except as mentioned above.  ROS: Constitutional: no weight loss/gain, + fatigue, + hot flushes, + poor sleep, + nocturia Eyes: no blurry vision, no xerophthalmia ENT: no sore throat, no nodules palpated in throat, no dysphagia/odynophagia, no hoarseness Cardiovascular: no CP/SOB/palpitations/no leg swelling Respiratory: no cough/SOB Gastrointestinal: no N/V/D/C/ heartburn Musculoskeletal: + muscle aches/+ joint aches Skin: no rashes Neurological: no tremors/numbness/tingling/dizziness  PE: BP 144/62  Pulse 71  Temp(Src) 97.8 F (36.6 C) (Oral)  Resp 16  Wt 264 lb (119.75 kg)  SpO2 97% Wt Readings from Last 3 Encounters:  05/27/14 264 lb (119.75 kg)  04/24/14 263 lb (119.296 kg)  04/16/14 264 lb (119.75 kg)   Constitutional: obese, in NAD Eyes: PERRLA, EOMI, no exophthalmos ENT: moist mucous membranes, no thyromegaly, no cervical lymphadenopathy Cardiovascular: RRR, No MRG Respiratory: CTA B Gastrointestinal: abdomen soft, NT, ND, BS+ Musculoskeletal: no deformities, strength intact in all 4 Skin: moist, warm, no rashes Neurological: no tremor with outstretched hands, DTR normal in all 4  ASSESSMENT: 1. DM2, ninsulin-dependent, uncontrolled, with complications - CAD H/o steroid  inj's  PLAN:  1. Patient with long-standing, uncontrolled diabetes, previously on premixed + rapid acting insulin regimen. We switched to basal-bolus regimen, but he felt this was not working and restarted the premixed regimen + rapid acting insulin, only taking the premixed insulin at bedtime!! We can go back to this regimen, but taken correctly, 2x a day, before meals. We did discuss U500 (he has been on this in the past and thinks he developed PNA form it - I advised him that this would be terribly unusual. We will likely need to go back to this, but would like to do this after his surgery. - We discussed about options for treatment, and I suggested to:  Patient Instructions  Please stop the Levemir. Start the following regimen: - Novolog 70/30 units 60 units 2x a day, before breakfast and dinner. Advised not to take at bedtime! - NovoLog 20 units before breakfast, lunch and dinner Please return in 1 month with your sugar log.  Please stop at downstairs lab [facility]). - continue checking sugars at different times of the day - check 3-4 times a day, rotating checks - up to date with yearly eye exams  - will check HbA1c today - Return to clinic in 1 mo with sugar log    Orders Only on 05/27/2014  Component Date Value Ref Range Status  . Hemoglobin A1C 05/27/2014 10.2* <5.7 % Final   Comment:                                                                                                 According to the ADA Clinical Practice Recommendations for 2011, when                          HbA1c is used as a screening test:                                                       >=6.5%   Diagnostic of Diabetes Mellitus                                     (if abnormal result is confirmed)  5.7-6.4%   Increased risk of developing Diabetes Mellitus                                                     References:Diagnosis and Classification of Diabetes  Mellitus,Diabetes                          XYVO,5929,24(MQKMM 1):S62-S69 and Standards of Medical Care in                                  Diabetes - 2011,Diabetes NOTR,7116,57 (Suppl 1):S11-S61.                             . Mean Plasma Glucose 05/27/2014 246* <117 mg/dL Final   HbA1c increased as he changed his insulin regimen and this was not adequate (was only taking the 70/30 insulin once a day, at bedtime!!!).

## 2014-05-27 NOTE — Patient Instructions (Signed)
Please stop the Levemir. Start the following regimen: - Novolog 70/30 units 60 units 2x a day, before breakfast and dinner. - NovoLog 20 units before breakfast, lunch and dinner  Please return in 1 month with your sugar log.   Please stop at downstairs lab [facility]).

## 2014-06-03 ENCOUNTER — Ambulatory Visit: Payer: Medicare Other | Admitting: Interventional Cardiology

## 2014-06-03 ENCOUNTER — Other Ambulatory Visit: Payer: Self-pay | Admitting: *Deleted

## 2014-06-03 ENCOUNTER — Telehealth: Payer: Self-pay | Admitting: Neurology

## 2014-06-03 ENCOUNTER — Telehealth: Payer: Self-pay | Admitting: Internal Medicine

## 2014-06-03 MED ORDER — INSULIN ASPART 100 UNIT/ML ~~LOC~~ SOLN: 20.0000 [IU] | Freq: Three times a day (TID) | SUBCUTANEOUS | Status: DC

## 2014-06-03 MED ORDER — INSULIN SYRINGES (DISPOSABLE) U-100 1 ML MISC: 60.0000 [IU] | Freq: Four times a day (QID) | Status: DC

## 2014-06-03 NOTE — Telephone Encounter (Signed)
Patient would like to have his syringes and insulin called in  100 syringes  [place]   Thank you

## 2014-06-03 NOTE — Telephone Encounter (Signed)
Pt called on 06/03/14 to cancel his 06/09/14 EMG

## 2014-06-03 NOTE — Telephone Encounter (Signed)
Pt called requesting a refill of his Novolog and syringes. Pt going out of town and will need them before he returns. Done.

## 2014-06-09 ENCOUNTER — Encounter: Payer: Medicare Other | Admitting: Neurology

## 2014-06-10 DIAGNOSIS — I499 Cardiac arrhythmia, unspecified: Secondary | ICD-10-CM | POA: Diagnosis not present

## 2014-06-10 DIAGNOSIS — R072 Precordial pain: Secondary | ICD-10-CM | POA: Diagnosis not present

## 2014-06-10 DIAGNOSIS — Z96659 Presence of unspecified artificial knee joint: Secondary | ICD-10-CM | POA: Diagnosis not present

## 2014-06-10 DIAGNOSIS — M259 Joint disorder, unspecified: Secondary | ICD-10-CM | POA: Diagnosis not present

## 2014-06-10 DIAGNOSIS — E119 Type 2 diabetes mellitus without complications: Secondary | ICD-10-CM | POA: Diagnosis not present

## 2014-06-10 DIAGNOSIS — I10 Essential (primary) hypertension: Secondary | ICD-10-CM | POA: Diagnosis present

## 2014-06-10 DIAGNOSIS — R0789 Other chest pain: Secondary | ICD-10-CM | POA: Diagnosis not present

## 2014-06-10 DIAGNOSIS — M67919 Unspecified disorder of synovium and tendon, unspecified shoulder: Secondary | ICD-10-CM | POA: Diagnosis not present

## 2014-06-10 DIAGNOSIS — F411 Generalized anxiety disorder: Secondary | ICD-10-CM | POA: Diagnosis not present

## 2014-06-10 DIAGNOSIS — M719 Bursopathy, unspecified: Secondary | ICD-10-CM | POA: Diagnosis not present

## 2014-06-10 DIAGNOSIS — M1710 Unilateral primary osteoarthritis, unspecified knee: Secondary | ICD-10-CM | POA: Diagnosis present

## 2014-06-10 DIAGNOSIS — R079 Chest pain, unspecified: Secondary | ICD-10-CM | POA: Diagnosis not present

## 2014-06-20 DIAGNOSIS — M7500 Adhesive capsulitis of unspecified shoulder: Secondary | ICD-10-CM | POA: Diagnosis not present

## 2014-06-20 DIAGNOSIS — M67919 Unspecified disorder of synovium and tendon, unspecified shoulder: Secondary | ICD-10-CM | POA: Diagnosis not present

## 2014-07-04 ENCOUNTER — Encounter: Payer: Self-pay | Admitting: Family Medicine

## 2014-07-09 ENCOUNTER — Telehealth: Payer: Self-pay | Admitting: Neurology

## 2014-07-09 NOTE — Telephone Encounter (Signed)
Pt going out of town but wants to go forward in getting his testing scheduled. I believe this is an EMG. Can you confirm this and also let us know how long he will need.   Note to front office, we will need to schedule a follow up appt as well. Pt requests October appts.   CB# [phone] / Sherri S.

## 2014-07-09 NOTE — Telephone Encounter (Signed)
EMG right arm and leg, 90-min.  Donika K. Posey Pronto, DO

## 2014-07-11 ENCOUNTER — Ambulatory Visit: Payer: Medicare Other | Admitting: Neurology

## 2014-07-14 NOTE — Telephone Encounter (Signed)
Please contact pt to schedule appt. / Venida Jarvis

## 2014-07-17 ENCOUNTER — Ambulatory Visit (INDEPENDENT_AMBULATORY_CARE_PROVIDER_SITE_OTHER): Payer: Medicare Other | Admitting: Family Medicine

## 2014-07-17 ENCOUNTER — Encounter: Payer: Self-pay | Admitting: Family Medicine

## 2014-07-17 VITALS — BP 170/74 | HR 73 | Temp 98.0°F | Wt 278.7 lb

## 2014-07-17 DIAGNOSIS — K2100 Gastro-esophageal reflux disease with esophagitis, without bleeding: Secondary | ICD-10-CM

## 2014-07-17 DIAGNOSIS — E1159 Type 2 diabetes mellitus with other circulatory complications: Secondary | ICD-10-CM

## 2014-07-17 DIAGNOSIS — M67919 Unspecified disorder of synovium and tendon, unspecified shoulder: Secondary | ICD-10-CM | POA: Diagnosis not present

## 2014-07-17 DIAGNOSIS — Z79899 Other long term (current) drug therapy: Secondary | ICD-10-CM | POA: Diagnosis not present

## 2014-07-17 DIAGNOSIS — E785 Hyperlipidemia, unspecified: Secondary | ICD-10-CM | POA: Diagnosis not present

## 2014-07-17 DIAGNOSIS — I200 Unstable angina: Secondary | ICD-10-CM

## 2014-07-17 DIAGNOSIS — I10 Essential (primary) hypertension: Secondary | ICD-10-CM

## 2014-07-17 DIAGNOSIS — M719 Bursopathy, unspecified: Secondary | ICD-10-CM | POA: Diagnosis not present

## 2014-07-17 DIAGNOSIS — R82998 Other abnormal findings in urine: Secondary | ICD-10-CM

## 2014-07-17 DIAGNOSIS — F419 Anxiety disorder, unspecified: Secondary | ICD-10-CM

## 2014-07-17 DIAGNOSIS — F411 Generalized anxiety disorder: Secondary | ICD-10-CM | POA: Diagnosis not present

## 2014-07-17 DIAGNOSIS — R8290 Unspecified abnormal findings in urine: Secondary | ICD-10-CM

## 2014-07-17 LAB — POCT URINALYSIS DIPSTICK
Bilirubin, UA: NEGATIVE
GLUCOSE UA: 3
KETONES UA: NEGATIVE
Leukocytes, UA: NEGATIVE
Nitrite, UA: NEGATIVE
Protein, UA: 1
UROBILINOGEN UA: 0.2
pH, UA: 6

## 2014-07-17 LAB — BASIC METABOLIC PANEL
BUN: 12 mg/dL (ref 6–23)
CALCIUM: 8.6 mg/dL (ref 8.4–10.5)
CO2: 26 mEq/L (ref 19–32)
CREATININE: 1 mg/dL (ref 0.4–1.5)
Chloride: 104 mEq/L (ref 96–112)
GFR: 92.29 mL/min (ref >60.00)
Glucose, Bld: 306 mg/dL — ABNORMAL HIGH (ref 70–99)
Potassium: 3.5 mEq/L (ref 3.5–5.1)
Sodium: 138 mEq/L (ref 135–145)

## 2014-07-17 LAB — LIPID PANEL
Cholesterol: 181 mg/dL (ref 0–200)
HDL: 40.7 mg/dL (ref >39.00)
LDL CALC: 111 mg/dL — AB (ref 0–99)
NonHDL: 140.3
TRIGLYCERIDES: 149 mg/dL (ref 0.0–149.0)
Total CHOL/HDL Ratio: 4
VLDL: 29.8 mg/dL (ref 0.0–40.0)

## 2014-07-17 LAB — HEPATIC FUNCTION PANEL
ALT: 18 U/L (ref 0–53)
AST: 18 U/L (ref 0–37)
Albumin: 3.7 g/dL (ref 3.5–5.2)
Alkaline Phosphatase: 105 U/L (ref 39–117)
BILIRUBIN TOTAL: 0.7 mg/dL (ref 0.2–1.2)
Bilirubin, Direct: 0 mg/dL (ref 0.0–0.3)
TOTAL PROTEIN: 7 g/dL (ref 6.0–8.3)

## 2014-07-17 MED ORDER — LISINOPRIL 20 MG PO TABS: 20.0000 mg | ORAL_TABLET | Freq: Every day | ORAL | Status: DC

## 2014-07-17 MED ORDER — CLONAZEPAM 1 MG PO TABS: 1.0000 mg | ORAL_TABLET | Freq: Every evening | ORAL | Status: DC | PRN

## 2014-07-17 MED ORDER — PANTOPRAZOLE SODIUM 40 MG PO TBEC: 40.0000 mg | DELAYED_RELEASE_TABLET | Freq: Every day | ORAL | Status: DC

## 2014-07-17 NOTE — Patient Instructions (Signed)
Diabetes and Standards of Medical Care Diabetes is complicated. You may find that your diabetes team includes a dietitian, nurse, diabetes educator, eye doctor, and more. To help everyone know what is going on and to help you get the care you deserve, the following schedule of care was developed to help keep you on track. Below are the tests, exams, vaccines, medicines, education, and plans you will need. HbA1c test This test shows how well you have controlled your glucose over the past 2-3 months. It is used to see if your diabetes management plan needs to be adjusted.   It is performed at least 2 times a year if you are meeting treatment goals.  It is performed 4 times a year if therapy has changed or if you are not meeting treatment goals. Blood pressure test  This test is performed at every routine medical visit. The goal is less than 140/90 mm Hg for most people, but 130/80 mm Hg in some cases. Ask your health care provider about your goal. Dental exam  Follow up with the dentist regularly. Eye exam  If you are diagnosed with type 1 diabetes as a child, get an exam upon reaching the age of 37 years or older and have had diabetes for 3-5 years. Yearly eye exams are recommended after that initial eye exam.  If you are diagnosed with type 1 diabetes as an adult, get an exam within 5 years of diagnosis and then yearly.  If you are diagnosed with type 2 diabetes, get an exam as soon as possible after the diagnosis and then yearly. Foot care exam  Visual foot exams are performed at every routine medical visit. The exams check for cuts, injuries, or other problems with the feet.  A comprehensive foot exam should be done yearly. This includes visual inspection as well as assessing foot pulses and testing for loss of sensation.  Check your feet nightly for cuts, injuries, or other problems with your feet. Tell your health care provider if anything is not healing. Kidney function test (urine  microalbumin)  This test is performed once a year.  Type 1 diabetes: The first test is performed 5 years after diagnosis.  Type 2 diabetes: The first test is performed at the time of diagnosis.  A serum creatinine and estimated glomerular filtration rate (eGFR) test is done once a year to assess the level of chronic kidney disease (CKD), if present. Lipid profile (cholesterol, HDL, LDL, triglycerides)  Performed every 5 years for most people.  The goal for LDL is less than 100 mg/dL. If you are at high risk, the goal is less than 70 mg/dL.  The goal for HDL is 40 mg/dL-50 mg/dL for men and 50 mg/dL-60 mg/dL for women. An HDL cholesterol of 60 mg/dL or higher gives some protection against heart disease.  The goal for triglycerides is less than 150 mg/dL. Influenza vaccine, pneumococcal vaccine, and hepatitis B vaccine  The influenza vaccine is recommended yearly.  It is recommended that people with diabetes who are over 24 years old get the pneumonia vaccine. In some cases, two separate shots may be given. Ask your health care provider if your pneumonia vaccination is up to date.  The hepatitis B vaccine is also recommended for adults with diabetes. Diabetes self-management education  Education is recommended at diagnosis and ongoing as needed. Treatment plan  Your treatment plan is reviewed at every medical visit. Document Released: 08/14/2009 Document Revised: 03/03/2014 Document Reviewed: 03/19/2013 [facility] Patient Information 2015 [place],  LL[employer]. This information is not intended to replace advice given to you by your health care provider. Make sure you discuss any questions you have with your health care provider.  

## 2014-07-17 NOTE — Addendum Note (Signed)
Addended by: Peggyann Shoals on: 07/17/2014 03:30 PM   Modules accepted: Orders

## 2014-07-17 NOTE — Progress Notes (Signed)
Pre visit review using our clinic review tool, if applicable. No additional management support is needed unless otherwise documented below in the visit note. 

## 2014-07-17 NOTE — Progress Notes (Signed)
Subjective:    Patient ID: Edward [name], male    DOB: [birth year], 73 y.o.   MRN: [id number]  HPI  HPI HYPERTENSION  Blood pressure range-not check ing   Chest pain- no      Dyspnea- no Lightheadedness- no   Edema- no Other side effects - no   Medication compliance: good Low salt diet- yes  DIABETES  Blood Sugar ranges-good per pt  Polyuria- no New Visual problems- no Hypoglycemic symptoms- no Other side effects-no Medication compliance - good Last eye exam- done per pt Foot exam- endo  HYPERLIPIDEMIA  Medication compliance- good RUQ pain- no  Muscle aches- no Other side effects-no  Review of Systems As above    Past Medical History  Diagnosis Date  . Hypertension   . Arthritis   . CAD (coronary artery disease)     a. Pt reports "small heart attack" in 2013 at [facility], denies need for stent or CABG, does not know findings of cath.  . Pneumonia 01/2013  . TIA (transient ischemic attack) 2002  . Dyslipidemia, goal LDL below 70 01/29/2014  . DM type 2 (diabetes mellitus, type 2), insulin dependent 01/29/2014  . Heart attack 02/03/2014    "mild heart attack"  . GERD (gastroesophageal reflux disease)   . Enlarged prostate   . Pneumonia 03/2014   History   Social History  . Marital Status: Married    Spouse Name: N/A    Number of Children: 0  . Years of Education: N/A   Occupational History  . retired    Social History Main Topics  . Smoking status: Never Smoker   . Smokeless tobacco: Never Used  . Alcohol Use: No  . Drug Use: No  . Sexual Activity: Not on file   Other Topics Concern  . 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   He lives alone.  His wife is living in [place] at this time.  They do not have children.   Highest level of education:  College graduate   Family History  Problem Relation Age of Onset  . CAD Brother     2  brothers - CABG  . Alzheimer's disease Sister   . CAD Sister   . Prostate cancer Brother   . Diabetes Other     entire family   Current Outpatient Prescriptions  Medication Sig Dispense Refill  . acetaminophen (TYLENOL) 325 MG tablet Take 2 tablets (650 mg total) by mouth every 6 (six) hours as needed for moderate pain.      Marland Kitchen amLODipine (NORVASC) 10 MG tablet Take 1 tablet (10 mg total) by mouth daily.  30 tablet  6  . aspirin EC 81 MG tablet Take 81 mg by mouth daily.      . Blood Glucose Monitoring Suppl (ONE TOUCH ULTRA SYSTEM KIT) W/DEVICE KIT Use to test blood sugar daily as instructed. Dx code: 250.00  1 each  0  . clonazePAM (KLONOPIN) 1 MG tablet Take 1 tablet (1 mg total) by mouth at bedtime as needed for anxiety.  30 tablet  0  . eplerenone (INSPRA) 25 MG tablet Take 1 tablet (25 mg total) by mouth daily.  30 tablet  0  . gabapentin (NEURONTIN) 100 MG capsule Take 100 mg by mouth 3 (three) times daily.      Marland Kitchen glucose blood (BL TEST STRIP  PACK) test strip Use to test blood sugar 4 times daily as instructed. Dx code: 250.00  200 each  4  . guaiFENesin (MUCINEX) 600 MG 12 hr tablet Take 1 tablet (600 mg total) by mouth 2 (two) times daily.  10 tablet  0  . insulin aspart (NOVOLOG) 100 UNIT/ML injection Inject 20 Units into the skin 3 (three) times daily with meals.  20 mL  0  . insulin aspart protamine- aspart (NOVOLOG MIX 70/30) (70-30) 100 UNIT/ML injection Inject 0.6 mLs (60 Units total) into the skin 2 (two) times daily with a meal.  40 mL  11  . Insulin Syringes, Disposable, U-100 1 ML MISC 60 Units by Does not apply route 4 (four) times daily.  120 each  2  . Lancets (ONETOUCH ULTRASOFT) lancets Use to test blood sugar 4 times daily as instructed. Dx code: 250.00  200 each  4  . lisinopril (PRINIVIL,ZESTRIL) 20 MG tablet Take 1 tablet (20 mg total) by mouth daily.  30 tablet  6  . metoprolol (LOPRESSOR) 50 MG tablet Take 1 tablet (50 mg total) by mouth 2 (two) times daily.  60  tablet  6  . nitroGLYCERIN (NITROSTAT) 0.4 MG SL tablet Place 1 tablet (0.4 mg total) under the tongue every 5 (five) minutes as needed for chest pain (CP or SOB).  25 tablet  4  . pantoprazole (PROTONIX) 40 MG tablet Take 1 tablet (40 mg total) by mouth daily at 6 (six) AM.  30 tablet  2  . prednisoLONE acetate (PRED FORTE) 1 % ophthalmic suspension Place 1 drop into both eyes 4 (four) times daily.      . rosuvastatin (CRESTOR) 20 MG tablet Take 1 tablet (20 mg total) by mouth daily.  30 tablet  3   No current facility-administered medications for this visit.    Objective:Marland Kitchen   Physical Exam BP 170/74  Pulse 73  Temp(Src) 98 F (36.7 C) (Oral)  Wt 278 lb 10.6 oz (126.4 kg)  SpO2 99% General appearance: alert, cooperative, appears stated age and no distress Neck: no adenopathy, no carotid bruit, no JVD, supple, symmetrical, trachea midline and thyroid not enlarged, symmetric, no tenderness/mass/nodules Lungs: clear to auscultation bilaterally Heart: S1, S2 normal and + murmur Extremities: extremities normal, atraumatic, no cyanosis or edema        Assessment & Plan:  1. Hyperlipidemia Check labs , con't meds - lisinopril (PRINIVIL,ZESTRIL) 20 MG tablet; Take 1 tablet (20 mg total) by mouth daily.  Dispense: 30 tablet; Refill: 6 - Hepatic function panel - Lipid panel  2. Gastroesophageal reflux disease with esophagitis Refill meds - pantoprazole (PROTONIX) 40 MG tablet; Take 1 tablet (40 mg total) by mouth daily at 6 (six) AM.  Dispense: 30 tablet; Refill: 2  3. Anxiety Stable, rare use - clonazePAM (KLONOPIN) 1 MG tablet; Take 1 tablet (1 mg total) by mouth at bedtime as needed for anxiety.  Dispense: 30 tablet; Refill: 0  4. Type II or unspecified type diabetes mellitus with peripheral circulatory disorders, uncontrolled(250.72) Per endo - POCT urinalysis dipstick  5. Essential hypertension Check labs, stable - Basic metabolic panel

## 2014-07-19 LAB — URINE CULTURE: Colony Count: 4000

## 2014-07-21 ENCOUNTER — Other Ambulatory Visit (HOSPITAL_COMMUNITY): Payer: Self-pay | Admitting: Orthopedic Surgery

## 2014-07-23 ENCOUNTER — Telehealth: Payer: Self-pay | Admitting: Internal Medicine

## 2014-07-23 DIAGNOSIS — R3915 Urgency of urination: Secondary | ICD-10-CM | POA: Diagnosis not present

## 2014-07-23 DIAGNOSIS — R82998 Other abnormal findings in urine: Secondary | ICD-10-CM | POA: Diagnosis not present

## 2014-07-23 MED ORDER — INSULIN ASPART PROT & ASPART (70-30 MIX) 100 UNIT/ML ~~LOC~~ SUSP: 60.0000 [IU] | Freq: Two times a day (BID) | SUBCUTANEOUS | Status: DC

## 2014-07-23 MED ORDER — INSULIN ASPART 100 UNIT/ML ~~LOC~~ SOLN: 20.0000 [IU] | Freq: Three times a day (TID) | SUBCUTANEOUS | Status: DC

## 2014-07-23 NOTE — Telephone Encounter (Signed)
Rx refills sent. Called pt and lvm advising him that his refills have been sent to his pharmacy. Advised him to call back and schedule an appt with Dr Cruzita Lederer to discuss sugars, prior to surgery.

## 2014-07-23 NOTE — Telephone Encounter (Signed)
Patient states he has been out of town and needs a refill on his Novolin 70/30 and Novolog 100 Can you please call asap as he is almost out   Also, he would like to know if Dr. Cruzita Lederer needs to see him before his surgery as his sugars have been off a little   Please advise   Thank you

## 2014-07-25 ENCOUNTER — Encounter: Payer: Self-pay | Admitting: Internal Medicine

## 2014-07-25 ENCOUNTER — Ambulatory Visit (INDEPENDENT_AMBULATORY_CARE_PROVIDER_SITE_OTHER): Payer: Medicare Other | Admitting: Internal Medicine

## 2014-07-25 ENCOUNTER — Other Ambulatory Visit: Payer: Self-pay

## 2014-07-25 VITALS — BP 180/58 | HR 96 | Temp 97.8°F | Ht 68.0 in | Wt 277.0 lb

## 2014-07-25 DIAGNOSIS — E785 Hyperlipidemia, unspecified: Secondary | ICD-10-CM

## 2014-07-25 DIAGNOSIS — E119 Type 2 diabetes mellitus without complications: Secondary | ICD-10-CM | POA: Diagnosis not present

## 2014-07-25 DIAGNOSIS — I200 Unstable angina: Secondary | ICD-10-CM | POA: Diagnosis not present

## 2014-07-25 LAB — GLUCOSE, POCT (MANUAL RESULT ENTRY): POC Glucose: 210 mg/dl — AB (ref 70–99)

## 2014-07-25 MED ORDER — INSULIN ASPART 100 UNIT/ML ~~LOC~~ SOLN: SUBCUTANEOUS | Status: DC

## 2014-07-25 MED ORDER — ROSUVASTATIN CALCIUM 20 MG PO TABS
30.0000 mg | ORAL_TABLET | Freq: Every day | ORAL | Status: DC
Start: 2014-07-25 — End: 2014-07-28

## 2014-07-25 MED ORDER — INSULIN ASPART PROT & ASPART (70-30 MIX) 100 UNIT/ML ~~LOC~~ SUSP
SUBCUTANEOUS | Status: DC
Start: 2014-07-25 — End: 2014-07-28

## 2014-07-25 NOTE — Progress Notes (Signed)
Patient ID: Edward [name], male   DOB: 04-Sep-1941, 73 y.o.   MRN: [id number]  HPI: Edward [name] is a 73 y.o.-year-old male, returning for f/u for DM2,  dx 1987, insulin-dependent since 1989, uncontrolled, with complications (CAD, PN). He moved from [place] in 12/2013. Last visit 1.5 mo ago.  He will have surgery for his R shoulder in 07/30/2014. Last steroid inj was 3 weeks ago.   Last hemoglobin A1c was: Lab Results  Component Value Date   HGBA1C 10.2* 05/27/2014   HGBA1C 9.1* 01/27/2014   He was on: - Levemir 75 >> 90 units at bedtime. - NovoLog: - 30 >> 40 units with breakfast - 20 >> 30  units with lunch - 30 >> 40 units with dinner But he felt that this regimen was not working. He was on Metformin >> N/V/D.  He asked me whether it is possible to switch back to 70/30 insulin. He is now on: - NovoLog 70/30 units 40 units and 40 units 15 min before B, D - NovoLog 20 units before breakfast, lunch, and dinner  Pt checks his sugars 4-5 a day and they are widely fluctuating: - am: 180-270 >> 100-331 (436) >> 128-344 >> 52, 60, 79-339 - 2h after b'fast: n/c >> 200 >> 78 x1, 209-337 >> 128-263, 380 - before lunch: 280-310 >> 216, 294, 402 >> 179-370 >> 68, 178-299, 319 - 2h after lunch: n/c >> 222, 375, 545 >> 198-309 >> 121-254 - before dinner: 280s >> 177 >> 67 x1, 175-312 >> 302, 395 - 2h after dinner: n/c >> 301-379>> 99, 128, 368 - bedtime: 200-300 >> 190-463 >> 46, 284 - nighttime: n/c >> 300s >> 139-339 Lowest sugar was 46 - he has hypoglycemia awareness at 90  Highest sugar was 490 x 1 - cortisone.  Has a One Touch Ultra meter.   Pt's meals are: - Breakfast: eggs, oatmeal, grits, bacon - Lunch: baked chiken + vegetables - Dinner: same as lunch - Snacks: sweets - cookies Rarely fast food.  - no CKD, last BUN/creatinine:  Lab Results  Component Value Date   BUN 12 07/17/2014   CREATININE 1.0 07/17/2014  On Lisinopril. ACR 37.6 in 02/11/2014) - Has HL. Last set of  lipids: Lab Results  Component Value Date   CHOL 181 07/17/2014   HDL 40.70 07/17/2014   LDLCALC 111* 07/17/2014   TRIG 149.0 07/17/2014   CHOLHDL 4 07/17/2014  Not on a statin. He gets leg cramps from Lipitor >> cannot tolerate it. - last eye exam was in 05/2014 (Dr Zigmund Daniel). Has DR. Had cataracts sx. He needs palpebral sx >> insurance not covering it - + numbness and tingling in his feet >> will start neurontin. He saw podiatrist (02/2014) >> has PN >> he will get new socks.  On ASA 81.  I reviewed pt's medications, allergies, PMH, social hx, family hx and no changes required, except as mentioned above.  ROS: Constitutional: no weight loss/gain, + fatigue, + hot flushes, + poor sleep, + nocturia Eyes: no blurry vision, no xerophthalmia ENT: no sore throat, no nodules palpated in throat, no dysphagia/odynophagia, no hoarseness Cardiovascular: no CP/SOB/palpitations/no leg swelling Respiratory: no cough/SOB Gastrointestinal: no N/V/D/C/ heartburn Musculoskeletal: + muscle aches/+ joint aches (shoulders)  Skin: no rashes Neurological: no tremors/numbness/tingling/dizziness  PE: BP 180/58  Pulse 96  Temp(Src) 97.8 F (36.6 C) (Oral)  Ht 5\' 8"  (1.727 m)  Wt 277 lb (125.646 kg)  BMI 42.13 kg/m2  SpO2 96% Wt Readings from Last  3 Encounters:  07/25/14 277 lb (125.646 kg)  07/17/14 278 lb 10.6 oz (126.4 kg)  05/27/14 264 lb (119.75 kg)   Constitutional: obese, in NAD Eyes: PERRLA, EOMI, no exophthalmos ENT: moist mucous membranes, no thyromegaly, no cervical lymphadenopathy Cardiovascular: RRR, No MRG Respiratory: CTA B Gastrointestinal: abdomen soft, NT, ND, BS+ Musculoskeletal: no deformities, strength intact in all 4 Skin: moist, warm, no rashes Neurological: no tremor with outstretched hands, DTR normal in all 4  ASSESSMENT: 1. DM2, ninsulin-dependent, uncontrolled, with complications - CAD H/o steroid inj's He has been on U500 (he has been on this in the past and  thinks he developed PNA form it!)  PLAN:  1. Patient with long-standing, uncontrolled diabetes, previously on premixed + rapid acting insulin regimen. We switched to basal-bolus regimen, but he felt this was not working and restarted the premixed regimen + rapid acting insulin.  We discussed about U500 at previous visit and we will likely need to go back to this, but would like to do this after his surgery. I still believe he has some confusion about the regimen, some of the doses are taken late at night (2 am) but he mentions that's when he had dinner - We discussed about options for treatment, and I suggested to:  Patient Instructions  Please change the insulin regimen as follows: Insulin Before breakfast Before lunch Before dinner  NovoLog 70/30 40 20 40  NovoLog 15 15 15   Please inject the insulin 15 min before meals. Please return in 1 month with your sugar log.  - I advised him that he could mix the insulins in the same column - continue checking sugars at different times of the day - check 3-4 times a day, rotating checks - up to date with yearly eye exams  - Return to clinic in 1 mo with sugar log

## 2014-07-25 NOTE — Patient Instructions (Addendum)
Please change the insulin regimen as follows:  Insulin Before breakfast Before lunch Before dinner  NovoLog 70/30 40 20 40  NovoLog 15 15 15    Please inject the insulin 15 min before meals.  Please return in 1 month with your sugar log.

## 2014-07-25 NOTE — Progress Notes (Signed)
Pre visit review using our clinic review tool, if applicable. No additional management support is needed unless otherwise documented below in the visit note. 

## 2014-07-28 ENCOUNTER — Encounter (HOSPITAL_COMMUNITY): Payer: Self-pay | Admitting: Pharmacy Technician

## 2014-07-28 ENCOUNTER — Ambulatory Visit: Payer: Medicare Other | Admitting: Family Medicine

## 2014-07-29 ENCOUNTER — Encounter (HOSPITAL_COMMUNITY)
Admission: RE | Admit: 2014-07-29 | Discharge: 2014-07-29 | Disposition: A | Discharge status: Home / Self Care | Payer: Medicare Other | Source: Ambulatory Visit | Attending: Orthopedic Surgery | Admitting: Orthopedic Surgery

## 2014-07-29 ENCOUNTER — Ambulatory Visit (HOSPITAL_COMMUNITY)
Admission: RE | Admit: 2014-07-29 | Discharge: 2014-07-31 | Disposition: A | Discharge status: Home / Self Care | Payer: Medicare Other | Source: Ambulatory Visit | Attending: Orthopedic Surgery | Admitting: Orthopedic Surgery

## 2014-07-29 ENCOUNTER — Encounter (HOSPITAL_COMMUNITY): Payer: Self-pay

## 2014-07-29 DIAGNOSIS — I252 Old myocardial infarction: Secondary | ICD-10-CM | POA: Diagnosis not present

## 2014-07-29 DIAGNOSIS — I10 Essential (primary) hypertension: Secondary | ICD-10-CM | POA: Insufficient documentation

## 2014-07-29 DIAGNOSIS — M24019 Loose body in unspecified shoulder: Secondary | ICD-10-CM | POA: Diagnosis not present

## 2014-07-29 DIAGNOSIS — I2510 Atherosclerotic heart disease of native coronary artery without angina pectoris: Secondary | ICD-10-CM | POA: Diagnosis not present

## 2014-07-29 DIAGNOSIS — Z794 Long term (current) use of insulin: Secondary | ICD-10-CM | POA: Insufficient documentation

## 2014-07-29 DIAGNOSIS — M19019 Primary osteoarthritis, unspecified shoulder: Secondary | ICD-10-CM | POA: Diagnosis not present

## 2014-07-29 DIAGNOSIS — S43431A Superior glenoid labrum lesion of right shoulder, initial encounter: Secondary | ICD-10-CM | POA: Insufficient documentation

## 2014-07-29 DIAGNOSIS — G4730 Sleep apnea, unspecified: Secondary | ICD-10-CM | POA: Diagnosis not present

## 2014-07-29 DIAGNOSIS — E119 Type 2 diabetes mellitus without complications: Secondary | ICD-10-CM | POA: Diagnosis not present

## 2014-07-29 DIAGNOSIS — Z6826 Body mass index (BMI) 26.0-26.9, adult: Secondary | ICD-10-CM | POA: Insufficient documentation

## 2014-07-29 DIAGNOSIS — M7541 Impingement syndrome of right shoulder: Secondary | ICD-10-CM | POA: Insufficient documentation

## 2014-07-29 DIAGNOSIS — S43439A Superior glenoid labrum lesion of unspecified shoulder, initial encounter: Secondary | ICD-10-CM | POA: Diagnosis not present

## 2014-07-29 DIAGNOSIS — M12819 Other specific arthropathies, not elsewhere classified, unspecified shoulder: Secondary | ICD-10-CM | POA: Diagnosis present

## 2014-07-29 DIAGNOSIS — K219 Gastro-esophageal reflux disease without esophagitis: Secondary | ICD-10-CM | POA: Insufficient documentation

## 2014-07-29 DIAGNOSIS — X58XXXA Exposure to other specified factors, initial encounter: Secondary | ICD-10-CM | POA: Diagnosis not present

## 2014-07-29 DIAGNOSIS — M25819 Other specified joint disorders, unspecified shoulder: Secondary | ICD-10-CM | POA: Diagnosis not present

## 2014-07-29 DIAGNOSIS — M12811 Other specific arthropathies, not elsewhere classified, right shoulder: Secondary | ICD-10-CM

## 2014-07-29 DIAGNOSIS — M75120 Complete rotator cuff tear or rupture of unspecified shoulder, not specified as traumatic: Secondary | ICD-10-CM | POA: Diagnosis not present

## 2014-07-29 DIAGNOSIS — M7501 Adhesive capsulitis of right shoulder: Secondary | ICD-10-CM | POA: Insufficient documentation

## 2014-07-29 DIAGNOSIS — M75101 Unspecified rotator cuff tear or rupture of right shoulder, not specified as traumatic: Secondary | ICD-10-CM | POA: Insufficient documentation

## 2014-07-29 DIAGNOSIS — M7500 Adhesive capsulitis of unspecified shoulder: Secondary | ICD-10-CM | POA: Diagnosis not present

## 2014-07-29 DIAGNOSIS — M24011 Loose body in right shoulder: Secondary | ICD-10-CM | POA: Insufficient documentation

## 2014-07-29 DIAGNOSIS — M19011 Primary osteoarthritis, right shoulder: Secondary | ICD-10-CM | POA: Insufficient documentation

## 2014-07-29 DIAGNOSIS — M249 Joint derangement, unspecified: Secondary | ICD-10-CM | POA: Diagnosis not present

## 2014-07-29 DIAGNOSIS — S46111A Strain of muscle, fascia and tendon of long head of biceps, right arm, initial encounter: Secondary | ICD-10-CM | POA: Insufficient documentation

## 2014-07-29 HISTORY — DX: Sleep apnea, unspecified: G47.30

## 2014-07-29 LAB — CBC
HCT: 41 % (ref 39.0–52.0)
Hemoglobin: 13.7 g/dL (ref 13.0–17.0)
MCH: 29.1 pg (ref 26.0–34.0)
MCHC: 33.4 g/dL (ref 30.0–36.0)
MCV: 87.2 fL (ref 78.0–100.0)
Platelets: 235 10*3/uL (ref 150–400)
RBC: 4.7 MIL/uL (ref 4.22–5.81)
RDW: 13.1 % (ref 11.5–15.5)
WBC: 8.2 10*3/uL (ref 4.0–10.5)

## 2014-07-29 LAB — PROTIME-INR
INR: 0.96 (ref 0.00–1.49)
PROTHROMBIN TIME: 12.8 s (ref 11.6–15.2)

## 2014-07-29 LAB — COMPREHENSIVE METABOLIC PANEL
ALT: 15 U/L (ref 0–53)
AST: 13 U/L (ref 0–37)
Albumin: 3.4 g/dL — ABNORMAL LOW (ref 3.5–5.2)
Alkaline Phosphatase: 112 U/L (ref 39–117)
Anion gap: 12 (ref 5–15)
BUN: 13 mg/dL (ref 6–23)
CALCIUM: 9.2 mg/dL (ref 8.4–10.5)
CO2: 28 mEq/L (ref 19–32)
CREATININE: 1.01 mg/dL (ref 0.50–1.35)
Chloride: 100 mEq/L (ref 96–112)
GFR calc Af Amer: 84 mL/min — ABNORMAL LOW (ref >90)
GFR, EST NON AFRICAN AMERICAN: 72 mL/min — AB (ref >90)
Glucose, Bld: 179 mg/dL — ABNORMAL HIGH (ref 70–99)
Potassium: 3.5 mEq/L — ABNORMAL LOW (ref 3.7–5.3)
Sodium: 140 mEq/L (ref 137–147)
TOTAL PROTEIN: 6.9 g/dL (ref 6.0–8.3)
Total Bilirubin: 0.3 mg/dL (ref 0.3–1.2)

## 2014-07-29 LAB — APTT: aPTT: 30 seconds (ref 24–37)

## 2014-07-29 MED ORDER — DEXTROSE 5 % IV SOLN
3.0000 g | INTRAVENOUS | Status: AC
  Administered 2014-07-30: 3 g via INTRAVENOUS
  Filled 2014-07-29: qty 3000

## 2014-07-29 NOTE — Progress Notes (Signed)
Primary - dr. Etter Sjogren Cardiologist - dr. Delton Prairie and echo in epic

## 2014-07-29 NOTE — Pre-Procedure Instructions (Signed)
Edward [name]  07/29/2014   Your procedure is scheduled on:  Wednesday, September 30th  Report to [name] Admitting at 0900 AM.  Call this number if you have problems the morning of surgery: [phone]   Remember:   Do not eat food or drink liquids after midnight.   Take these medicines the morning of surgery with A SIP OF WATER: norvasc, klonopin if needed, lopressor, protonix   Do not wear jewelry.  Do not wear lotions, powders, or perfumes, deodorant.  Do not shave 48 hours prior to surgery. Men may shave face and neck.  Do not bring valuables to the hospital.  [facility] is not responsible  for any belongings or valuables.               Contacts, dentures or bridgework may not be worn into surgery.  Leave suitcase in the car. After surgery it may be brought to your room.  For patients admitted to the hospital, discharge time is determined by your  treatment team.               Patients discharged the day of surgery will not be allowed to drive home.  Please read over the following fact sheets that you were given: Pain Booklet, Coughing and Deep Breathing and Surgical Site Infection Prevention [place] - Preparing for Surgery  Before surgery, you can play an important role.  Because skin is not sterile, your skin needs to be as free of germs as possible.  You can reduce the number of germs on you skin by washing with CHG (chlorahexidine gluconate) soap before surgery.  CHG is an antiseptic cleaner which kills germs and bonds with the skin to continue killing germs even after washing.  Please DO NOT use if you have an allergy to CHG or antibacterial soaps.  If your skin becomes reddened/irritated stop using the CHG and inform your nurse when you arrive at Short Stay.  Do not shave (including legs and underarms) for at least 48 hours prior to the first CHG shower.  You may shave your face.  Please follow these instructions carefully:   1.  Shower with CHG Soap  the night before surgery and the morning of Surgery.  2.  If you choose to wash your hair, wash your hair first as usual with your normal shampoo.  3.  After you shampoo, rinse your hair and body thoroughly to remove the shampoo.  4.  Use CHG as you would any other liquid soap.  You can apply CHG directly to the skin and wash gently with scrungie or a clean washcloth.  5.  Apply the CHG Soap to your body ONLY FROM THE NECK DOWN.  Do not use on open wounds or open sores.  Avoid contact with your eyes, ears, mouth and genitals (private parts).  Wash genitals (private parts) with your normal soap.  6.  Wash thoroughly, paying special attention to the area where your surgery will be performed.  7.  Thoroughly rinse your body with warm water from the neck down.  8.  DO NOT shower/wash with your normal soap after using and rinsing off the CHG Soap.  9.  Pat yourself dry with a clean towel.            10.  Wear clean pajamas.            11.  Place clean sheets on your bed the night of your first shower and do  not sleep with pets.  Day of Surgery  Do not apply any lotions/deoderants the morning of surgery.  Please wear clean clothes to the hospital/surgery center.

## 2014-07-30 ENCOUNTER — Encounter (HOSPITAL_COMMUNITY)
Admission: RE | Disposition: A | Discharge status: Home / Self Care | Payer: Self-pay | Source: Ambulatory Visit | Attending: Orthopedic Surgery

## 2014-07-30 ENCOUNTER — Ambulatory Visit (HOSPITAL_COMMUNITY): Payer: Medicare Other | Admitting: Certified Registered"

## 2014-07-30 ENCOUNTER — Encounter (HOSPITAL_COMMUNITY): Payer: Self-pay | Admitting: Certified Registered"

## 2014-07-30 ENCOUNTER — Encounter (HOSPITAL_COMMUNITY): Payer: Medicare Other | Admitting: Certified Registered"

## 2014-07-30 DIAGNOSIS — M249 Joint derangement, unspecified: Secondary | ICD-10-CM | POA: Diagnosis not present

## 2014-07-30 DIAGNOSIS — M12819 Other specific arthropathies, not elsewhere classified, unspecified shoulder: Secondary | ICD-10-CM | POA: Diagnosis present

## 2014-07-30 DIAGNOSIS — G8918 Other acute postprocedural pain: Secondary | ICD-10-CM | POA: Diagnosis not present

## 2014-07-30 DIAGNOSIS — M75120 Complete rotator cuff tear or rupture of unspecified shoulder, not specified as traumatic: Secondary | ICD-10-CM | POA: Diagnosis not present

## 2014-07-30 DIAGNOSIS — M7500 Adhesive capsulitis of unspecified shoulder: Secondary | ICD-10-CM | POA: Diagnosis not present

## 2014-07-30 DIAGNOSIS — M24019 Loose body in unspecified shoulder: Secondary | ICD-10-CM | POA: Diagnosis not present

## 2014-07-30 DIAGNOSIS — M719 Bursopathy, unspecified: Secondary | ICD-10-CM | POA: Diagnosis not present

## 2014-07-30 DIAGNOSIS — M67919 Unspecified disorder of synovium and tendon, unspecified shoulder: Secondary | ICD-10-CM | POA: Diagnosis not present

## 2014-07-30 HISTORY — PX: SHOULDER ARTHROSCOPY: SHX128

## 2014-07-30 LAB — GLUCOSE, CAPILLARY
GLUCOSE-CAPILLARY: 205 mg/dL — AB (ref 70–99)
GLUCOSE-CAPILLARY: 174 mg/dL — AB (ref 70–99)
Glucose-Capillary: 178 mg/dL — ABNORMAL HIGH (ref 70–99)
Glucose-Capillary: 278 mg/dL — ABNORMAL HIGH (ref 70–99)

## 2014-07-30 SURGERY — ARTHROSCOPY, SHOULDER
Anesthesia: Regional | Site: Shoulder | Laterality: Right

## 2014-07-30 MED ORDER — METHOCARBAMOL 500 MG PO TABS
500.0000 mg | ORAL_TABLET | Freq: Four times a day (QID) | ORAL | Status: DC | PRN
  Administered 2014-07-30 (×2): 500 mg via ORAL
  Filled 2014-07-30 (×2): qty 1

## 2014-07-30 MED ORDER — ACETAMINOPHEN 160 MG/5ML PO SOLN
325.0000 mg | ORAL | Status: DC | PRN
Start: 2014-07-30 — End: 2014-07-30

## 2014-07-30 MED ORDER — CEFAZOLIN SODIUM-DEXTROSE 2-3 GM-% IV SOLR
2.0000 g | Freq: Four times a day (QID) | INTRAVENOUS | Status: AC
  Administered 2014-07-30 – 2014-07-31 (×3): 2 g via INTRAVENOUS
  Filled 2014-07-30 (×4): qty 50

## 2014-07-30 MED ORDER — SODIUM CHLORIDE 0.9 % IR SOLN
Status: DC | PRN
  Administered 2014-07-30: 1000 mL

## 2014-07-30 MED ORDER — FENTANYL CITRATE 0.05 MG/ML IJ SOLN
INTRAMUSCULAR | Status: AC
  Filled 2014-07-30: qty 5

## 2014-07-30 MED ORDER — PANTOPRAZOLE SODIUM 40 MG PO TBEC
40.0000 mg | DELAYED_RELEASE_TABLET | Freq: Every day | ORAL | Status: DC
  Administered 2014-07-31: 40 mg via ORAL
  Filled 2014-07-30: qty 1

## 2014-07-30 MED ORDER — PROPOFOL 10 MG/ML IV BOLUS
INTRAVENOUS | Status: DC | PRN
  Administered 2014-07-30: 150 mg via INTRAVENOUS

## 2014-07-30 MED ORDER — METHOCARBAMOL 1000 MG/10ML IJ SOLN
500.0000 mg | Freq: Four times a day (QID) | INTRAVENOUS | Status: DC | PRN
  Filled 2014-07-30: qty 5

## 2014-07-30 MED ORDER — METOCLOPRAMIDE HCL 5 MG/ML IJ SOLN
5.0000 mg | Freq: Three times a day (TID) | INTRAMUSCULAR | Status: DC | PRN
  Administered 2014-07-30 – 2014-07-31 (×2): 10 mg via INTRAVENOUS
  Filled 2014-07-30 (×2): qty 2

## 2014-07-30 MED ORDER — CLONAZEPAM 1 MG PO TABS
1.0000 mg | ORAL_TABLET | Freq: Two times a day (BID) | ORAL | Status: DC | PRN
  Administered 2014-07-31: 1 mg via ORAL
  Filled 2014-07-30: qty 1

## 2014-07-30 MED ORDER — ROPIVACAINE HCL 5 MG/ML IJ SOLN
INTRAMUSCULAR | Status: DC | PRN
  Administered 2014-07-30: 20 mL via PERINEURAL

## 2014-07-30 MED ORDER — ACETAMINOPHEN 325 MG PO TABS: 325.0000 mg | ORAL_TABLET | ORAL | Status: DC | PRN

## 2014-07-30 MED ORDER — INSULIN ASPART 100 UNIT/ML ~~LOC~~ SOLN: 6.0000 [IU] | Freq: Three times a day (TID) | SUBCUTANEOUS | Status: DC

## 2014-07-30 MED ORDER — HYDROMORPHONE HCL 1 MG/ML IJ SOLN
0.5000 mg | INTRAMUSCULAR | Status: DC | PRN
  Administered 2014-07-30 (×2): 1 mg via INTRAVENOUS
  Filled 2014-07-30 (×3): qty 1

## 2014-07-30 MED ORDER — METOPROLOL TARTRATE 50 MG PO TABS
50.0000 mg | ORAL_TABLET | Freq: Two times a day (BID) | ORAL | Status: DC
  Administered 2014-07-31 (×2): 50 mg via ORAL
  Filled 2014-07-30 (×3): qty 1

## 2014-07-30 MED ORDER — OXYCODONE HCL 5 MG PO TABS: 5.0000 mg | ORAL_TABLET | Freq: Once | ORAL | Status: DC | PRN

## 2014-07-30 MED ORDER — PROPOFOL 10 MG/ML IV BOLUS
INTRAVENOUS | Status: AC
  Filled 2014-07-30: qty 20

## 2014-07-30 MED ORDER — LACTATED RINGERS IV SOLN
INTRAVENOUS | Status: DC
  Administered 2014-07-30 (×2): via INTRAVENOUS

## 2014-07-30 MED ORDER — SUCCINYLCHOLINE CHLORIDE 20 MG/ML IJ SOLN
INTRAMUSCULAR | Status: DC | PRN
  Administered 2014-07-30: 80 mg via INTRAVENOUS

## 2014-07-30 MED ORDER — LISINOPRIL 20 MG PO TABS
20.0000 mg | ORAL_TABLET | Freq: Every day | ORAL | Status: DC
  Administered 2014-07-30 – 2014-07-31 (×2): 20 mg via ORAL
  Filled 2014-07-30 (×2): qty 1

## 2014-07-30 MED ORDER — ASPIRIN EC 81 MG PO TBEC
81.0000 mg | DELAYED_RELEASE_TABLET | Freq: Every day | ORAL | Status: DC
  Administered 2014-07-30 – 2014-07-31 (×2): 81 mg via ORAL
  Filled 2014-07-30 (×2): qty 1

## 2014-07-30 MED ORDER — ONDANSETRON HCL 4 MG PO TABS: 4.0000 mg | ORAL_TABLET | Freq: Four times a day (QID) | ORAL | Status: DC | PRN

## 2014-07-30 MED ORDER — HYDROMORPHONE HCL 1 MG/ML IJ SOLN: 0.2500 mg | INTRAMUSCULAR | Status: DC | PRN

## 2014-07-30 MED ORDER — FENTANYL CITRATE 0.05 MG/ML IJ SOLN
INTRAMUSCULAR | Status: DC | PRN
  Administered 2014-07-30: 50 ug via INTRAVENOUS
  Administered 2014-07-30: 100 ug via INTRAVENOUS

## 2014-07-30 MED ORDER — MIDAZOLAM HCL 5 MG/5ML IJ SOLN
INTRAMUSCULAR | Status: DC | PRN
  Administered 2014-07-30: 2 mg via INTRAVENOUS

## 2014-07-30 MED ORDER — AMLODIPINE BESYLATE 10 MG PO TABS
10.0000 mg | ORAL_TABLET | Freq: Every day | ORAL | Status: DC
  Administered 2014-07-31: 10 mg via ORAL
  Filled 2014-07-30: qty 1

## 2014-07-30 MED ORDER — LIDOCAINE HCL (CARDIAC) 20 MG/ML IV SOLN
INTRAVENOUS | Status: DC | PRN
  Administered 2014-07-30: 80 mg via INTRAVENOUS

## 2014-07-30 MED ORDER — ONDANSETRON HCL 4 MG/2ML IJ SOLN
4.0000 mg | Freq: Four times a day (QID) | INTRAMUSCULAR | Status: DC | PRN
  Administered 2014-07-30: 4 mg via INTRAVENOUS
  Filled 2014-07-30: qty 2

## 2014-07-30 MED ORDER — INSULIN ASPART 100 UNIT/ML ~~LOC~~ SOLN
0.0000 [IU] | Freq: Three times a day (TID) | SUBCUTANEOUS | Status: DC
  Administered 2014-07-31 (×2): 11 [IU] via SUBCUTANEOUS

## 2014-07-30 MED ORDER — SODIUM CHLORIDE 0.9 % IV SOLN
INTRAVENOUS | Status: DC
  Administered 2014-07-30: 23:00:00 via INTRAVENOUS

## 2014-07-30 MED ORDER — MIDAZOLAM HCL 2 MG/2ML IJ SOLN
INTRAMUSCULAR | Status: AC
  Filled 2014-07-30: qty 2

## 2014-07-30 MED ORDER — OXYCODONE HCL 5 MG/5ML PO SOLN: 5.0000 mg | Freq: Once | ORAL | Status: DC | PRN

## 2014-07-30 MED ORDER — METOCLOPRAMIDE HCL 10 MG PO TABS: 5.0000 mg | ORAL_TABLET | Freq: Three times a day (TID) | ORAL | Status: DC | PRN

## 2014-07-30 MED ORDER — EPHEDRINE SULFATE 50 MG/ML IJ SOLN
INTRAMUSCULAR | Status: DC | PRN
  Administered 2014-07-30: 10 mg via INTRAVENOUS

## 2014-07-30 MED ORDER — DOCUSATE SODIUM 100 MG PO CAPS
100.0000 mg | ORAL_CAPSULE | Freq: Two times a day (BID) | ORAL | Status: DC
  Administered 2014-07-31: 100 mg via ORAL
  Filled 2014-07-30 (×3): qty 1

## 2014-07-30 MED ORDER — OXYCODONE-ACETAMINOPHEN 5-325 MG PO TABS
1.0000 | ORAL_TABLET | ORAL | Status: DC | PRN
  Administered 2014-07-31 (×3): 2 via ORAL
  Filled 2014-07-30 (×3): qty 2

## 2014-07-30 MED ORDER — ONDANSETRON HCL 4 MG/2ML IJ SOLN
INTRAMUSCULAR | Status: DC | PRN
  Administered 2014-07-30: 4 mg via INTRAVENOUS

## 2014-07-30 SURGICAL SUPPLY — 35 items
BLADE GREAT WHITE 4.2 (BLADE) ×2 IMPLANT
BLADE GREAT WHITE 4.2MM (BLADE) ×1
BUR OVAL 6.0 (BURR) ×3 IMPLANT
CANNULA SHOULDER 7CM (CANNULA) ×3 IMPLANT
COVER SURGICAL LIGHT HANDLE (MISCELLANEOUS) ×3 IMPLANT
DRAPE INCISE IOBAN 66X45 STRL (DRAPES) ×3 IMPLANT
DRAPE STERI 35X30 U-POUCH (DRAPES) ×3 IMPLANT
DRAPE U-SHAPE 47X51 STRL (DRAPES) ×3 IMPLANT
DRSG ADAPTIC 3X8 NADH LF (GAUZE/BANDAGES/DRESSINGS) ×3 IMPLANT
DRSG EMULSION OIL 3X3 NADH (GAUZE/BANDAGES/DRESSINGS) ×3 IMPLANT
DRSG PAD ABDOMINAL 8X10 ST (GAUZE/BANDAGES/DRESSINGS) ×6 IMPLANT
DURAPREP 26ML APPLICATOR (WOUND CARE) ×3 IMPLANT
GAUZE SPONGE 4X4 12PLY STRL (GAUZE/BANDAGES/DRESSINGS) ×3 IMPLANT
GLOVE BIOGEL PI IND STRL 9 (GLOVE) ×1 IMPLANT
GLOVE BIOGEL PI INDICATOR 9 (GLOVE) ×2
GLOVE SURG ORTHO 9.0 STRL STRW (GLOVE) ×3 IMPLANT
GOWN STRL REUS W/ TWL XL LVL3 (GOWN DISPOSABLE) ×2 IMPLANT
GOWN STRL REUS W/TWL XL LVL3 (GOWN DISPOSABLE) ×4
KIT BASIN OR (CUSTOM PROCEDURE TRAY) ×3 IMPLANT
KIT ROOM TURNOVER OR (KITS) ×3 IMPLANT
MANIFOLD NEPTUNE II (INSTRUMENTS) ×3 IMPLANT
NEEDLE SPNL 18GX3.5 QUINCKE PK (NEEDLE) ×3 IMPLANT
NS IRRIG 1000ML POUR BTL (IV SOLUTION) ×3 IMPLANT
PACK SHOULDER (CUSTOM PROCEDURE TRAY) ×3 IMPLANT
PAD ARMBOARD 7.5X6 YLW CONV (MISCELLANEOUS) ×6 IMPLANT
SET ARTHROSCOPY TUBING (MISCELLANEOUS) ×2
SET ARTHROSCOPY TUBING LN (MISCELLANEOUS) ×1 IMPLANT
SLING ARM IMMOBILIZER XL (CAST SUPPLIES) ×3 IMPLANT
SPONGE GAUZE 4X4 12PLY STER LF (GAUZE/BANDAGES/DRESSINGS) ×3 IMPLANT
SPONGE LAP 4X18 X RAY DECT (DISPOSABLE) ×3 IMPLANT
SUT ETHILON 2 0 FS 18 (SUTURE) ×3 IMPLANT
TAPE CLOTH SURG 6X10 WHT LF (GAUZE/BANDAGES/DRESSINGS) ×3 IMPLANT
TOWEL OR 17X24 6PK STRL BLUE (TOWEL DISPOSABLE) ×6 IMPLANT
WAND HAND CNTRL MULTIVAC 90 (MISCELLANEOUS) IMPLANT
WATER STERILE IRR 1000ML POUR (IV SOLUTION) ×3 IMPLANT

## 2014-07-30 NOTE — Progress Notes (Signed)
Orthopedic Tech Progress Note Patient Details:  Edward [name] 09/12/1941 [id number] Patient already has arm sling on. Patient ID: Edward [name], male   DOB: 12/12/1940, 73 y.o.   MRN: [id number]   [name] 07/30/2014, 5:42 PM

## 2014-07-30 NOTE — Anesthesia Procedure Notes (Addendum)
Procedure Name: Intubation Date/Time: 07/30/2014 12:56 PM Performed by: Manuela Schwartz B Pre-anesthesia Checklist: Patient identified, Emergency Drugs available, Suction available, Patient being monitored and Timeout performed Patient Re-evaluated:Patient Re-evaluated prior to inductionOxygen Delivery Method: Circle system utilized Intubation Type: IV induction and Rapid sequence Grade View: Grade I Tube type: Oral Tube size: 7.5 mm Number of attempts: 2 Airway Equipment and Method: Stylet and Video-laryngoscopy (unable to view cords with MAC blade) Placement Confirmation: ETT inserted through vocal cords under direct vision,  positive ETCO2 and breath sounds checked- equal and bilateral Secured at: 23 cm Tube secured with: Tape Dental Injury: Teeth and Oropharynx as per pre-operative assessment     Anesthesia Regional Block:  Interscalene brachial plexus block  Pre-Anesthetic Checklist: ,, timeout performed, Correct Patient, Correct Site, Correct Laterality, Correct Procedure, Correct Position, site marked, Risks and benefits discussed,  Surgical consent,  Pre-op evaluation,  At surgeon's request and post-op pain management  Laterality: Upper and Right  Prep: chloraprep       Needles:  Injection technique: Single-shot  Needle Type: Echogenic Stimulator Needle          Additional Needles:  Procedures: ultrasound guided (picture in chart) and nerve stimulator Interscalene brachial plexus block  Nerve Stimulator or Paresthesia:  Response: deltoid, 0.5 mA,   Additional Responses:   Narrative:  Start time: 07/30/2014 12:07 PM End time: 07/30/2014 12:15 PM Injection made incrementally with aspirations every 5 mL.  Performed by: Personally  Anesthesiologist: Keelie Zemanek  Additional Notes: H+P and labs reviewed, risks and benefits discussed with patient, procedure tolerated well without complications

## 2014-07-30 NOTE — Transfer of Care (Signed)
Immediate Anesthesia Transfer of Care Note  Patient: Edward [name]  Procedure(s) Performed: Procedure(s): Right Shoulder Arthroscopy, Debridement, Decompression, Manipulation Under Anesthesia (Right)  Patient Location: PACU  Anesthesia Type:General and Regional  Level of Consciousness: awake, alert  and oriented  Airway & Oxygen Therapy: Patient Spontanous Breathing and Patient connected to nasal cannula oxygen  Post-op Assessment: Report given to PACU RN and Post -op Vital signs reviewed and stable  Post vital signs: Reviewed and stable  Complications: No apparent anesthesia complications

## 2014-07-30 NOTE — Progress Notes (Signed)
Pt admitted from PACU complaining of L upper abdomen pain that radiates to L upper chest. Pt repts pain is similar to a "muscle pain". Area is soft to touch. Pt does have a cardiac history. Per PACU RN, pt did not complain of pain in the PACU. 12 lead EKG obtained. All rept to Dr. Sharol Given including pt's compliants, vital signs, EKG results. Per Dr. Sharol Given, for the surgery pt was positioned on his side and secured with a belt to that area. Will try Robaxin and pain medication as per his order. Will continue to monitor.

## 2014-07-30 NOTE — H&P (Signed)
Edward [name] is an 73 y.o. male.   Chief Complaint: Impingement right shoulder with pain and limited range of motion. HPI: Patient is a 73-year-old gentleman who has had chronic pain with his right shoulder. Patient has pain that is daily living decreased range of motion has failed conservative care including injections.  Past Medical History  Diagnosis Date  . Hypertension   . Arthritis   . CAD (coronary artery disease)     a. Pt reports "small heart attack" in 2013 at [facility], denies need for stent or CABG, does not know findings of cath.  . TIA (transient ischemic attack) 2002  . Dyslipidemia, goal LDL below 70 01/29/2014  . Heart attack 02/03/2014    "mild heart attack"  . GERD (gastroesophageal reflux disease)   . Enlarged prostate   . Shortness of breath     exertion  . Sleep apnea     does not use cpap  . Pneumonia 01/2013  . Pneumonia 03/2014    hospitalized  . DM type 2 (diabetes mellitus, type 2), insulin dependent 01/29/2014    fasting cbg 50-120 with new regimen  . Anxiety   . Headache(784.0)     Past Surgical History  Procedure Laterality Date  . Total knee arthroplasty Bilateral   . Cardiac catheterization  01/28/14    + CAD treat medically  . Lumbar disc surgery    . Cataract extraction Bilateral   . Joint replacement Bilateral     knee   . Back surgery    . Eye surgery Bilateral     Family History  Problem Relation Age of Onset  . CAD Brother     2 brothers - CABG  . Alzheimer's disease Sister   . CAD Sister   . Prostate cancer Brother   . Diabetes Other     entire family   Social History:  reports that he has never smoked. He has never used smokeless tobacco. He reports that he does not drink alcohol or use illicit drugs.  Allergies: No Known Allergies  No prescriptions prior to admission    Results for orders placed during the hospital encounter of 07/29/14 (from the past 48 hour(s))  APTT     Status: None   Collection Time     07/29/14 12:51 PM      Result Value Ref Range   aPTT 30  24 - 37 seconds  CBC     Status: None   Collection Time    07/29/14 12:51 PM      Result Value Ref Range   WBC 8.2  4.0 - 10.5 K/uL   RBC 4.70  4.22 - 5.81 MIL/uL   Hemoglobin 13.7  13.0 - 17.0 g/dL   HCT 41.0  39.0 - 52.0 %   MCV 87.2  78.0 - 100.0 fL   MCH 29.1  26.0 - 34.0 pg   MCHC 33.4  30.0 - 36.0 g/dL   RDW 13.1  11.5 - 15.5 %   Platelets 235  150 - 400 K/uL  COMPREHENSIVE METABOLIC PANEL     Status: Abnormal   Collection Time    07/29/14 12:51 PM      Result Value Ref Range   Sodium 140  137 - 147 mEq/L   Potassium 3.5 (*) 3.7 - 5.3 mEq/L   Chloride 100  96 - 112 mEq/L   CO2 28  19 - 32 mEq/L   Glucose, Bld 179 (*) 70 - 99 mg/dL     BUN 13  6 - 23 mg/dL   Creatinine, Ser 1.01  0.50 - 1.35 mg/dL   Calcium 9.2  8.4 - 10.5 mg/dL   Total Protein 6.9  6.0 - 8.3 g/dL   Albumin 3.4 (*) 3.5 - 5.2 g/dL   AST 13  0 - 37 U/L   ALT 15  0 - 53 U/L   Alkaline Phosphatase 112  39 - 117 U/L   Total Bilirubin 0.3  0.3 - 1.2 mg/dL   GFR calc non Af Amer 72 (*) >90 mL/min   GFR calc Af Amer 84 (*) >90 mL/min   Comment: (NOTE)     The eGFR has been calculated using the CKD EPI equation.     This calculation has not been validated in all clinical situations.     eGFR's persistently <90 mL/min signify possible Chronic Kidney     Disease.   Anion gap 12  5 - 15  PROTIME-INR     Status: None   Collection Time    07/29/14 12:51 PM      Result Value Ref Range   Prothrombin Time 12.8  11.6 - 15.2 seconds   INR 0.96  0.00 - 1.49   No results found.  Review of Systems  All other systems reviewed and are negative.   There were no vitals taken for this visit. Physical Exam  On examination patient has decreased abduction and flexion of the right shoulder. He has pain with Neer and Hawkins impingement test. Pain with drop arm test. Assessment/Plan Assessment: Rotator cuff pathology with impingement syndrome right  shoulder.  Plan: Will plan for right shoulder arthroscopy with subacromial decompression debridement rotator cuff tear debridement SLAP lesion. Risks and benefits were discussed including persistent pain infection need for additional surgery. Patient states he understands and wished to proceed at this time.  , V 07/30/2014, 6:41 AM    

## 2014-07-30 NOTE — Op Note (Signed)
07/30/2014  1:49 PM  PATIENT:  Edward [name]    PRE-OPERATIVE DIAGNOSIS:  Frozen Right Shoulder  POST-OPERATIVE DIAGNOSIS:  Same  PROCEDURE:  Right Shoulder Arthroscopy, Debridement, Decompression, Manipulation Under Anesthesia  SURGEON:  Newt Minion, MD  PHYSICIAN ASSISTANT:None ANESTHESIA:   General  PREOPERATIVE INDICATIONS:  Edward [name] is a  73 y.o. male with a diagnosis of Frozen Right Shoulder who failed conservative measures and elected for surgical management.    The risks benefits and alternatives were discussed with the patient preoperatively including but not limited to the risks of infection, bleeding, nerve injury, cardiopulmonary complications, the need for revision surgery, among others, and the patient was willing to proceed.  OPERATIVE IMPLANTS: none  OPERATIVE FINDINGS: massive rotator cuff tear  OPERATIVE PROCEDURE: Patient was brought to the operating room after failure of conservative care with a frozen right shoulder with massive rotator cuff tear. He has failed conservative care and presents at this time for surgical intervention.  Description of procedure patient was brought to the operating room and underwent a general anesthetic. After adequate levels of anesthesia were obtained patient was placed in beachchair position and the right upper extremity was prepped using DuraPrep draped into a sterile field. The scope was inserted from the posterior portal and anterior portals established with outside in technique an 18-gauge spinal. Visualization showed massive deposits from previous steroid injections massive tearing of the rotator cuff complete tearing of the biceps tendon massive tearing of the SLAP lesion loose bodies with osteochondral defect of the glenoid and humeral head. The shaver and elected for long were used and the massive rotator cuff was debrided. Patient underwent manipulation under anesthesia adhesions his subscapularis were also debrided. The  isthmus removed the scope was inserted from the posterior portal subchondral space and in the lateral portal was established. Visualization showed a type III acromion. Patient underwent subacromial decompression. He underwent further debridement rotator cuff tear. The isthmus removed the portals were closed using 3-0 nylon. A sterile compressive dressing was applied. Patient was placed in a sling extubated taken to the PACU in stable condition.

## 2014-07-30 NOTE — Anesthesia Postprocedure Evaluation (Signed)
Anesthesia Post Note  Patient: Edward [name]  Procedure(s) Performed: Procedure(s) (LRB): Right Shoulder Arthroscopy, Debridement, Decompression, Manipulation Under Anesthesia (Right)  Anesthesia type: general  Patient location: PACU  Post pain: Pain level controlled  Post assessment: Patient's Cardiovascular Status Stable  Last Vitals:  Filed Vitals:   07/30/14 1500  BP: 129/50  Pulse: 67  Temp: 36.8 C  Resp: 19    Post vital signs: Reviewed and stable  Level of consciousness: sedated  Complications: No apparent anesthesia complications

## 2014-07-30 NOTE — Anesthesia Preprocedure Evaluation (Addendum)
Anesthesia Evaluation  Patient identified by MRN, date of birth, ID band Patient awake    Reviewed: Allergy & Precautions, H&P , NPO status , Patient's Chart, lab work & pertinent test results  History of Anesthesia Complications Negative for: history of anesthetic complications  Airway Mallampati: III TM Distance: >3 FB Neck ROM: Full    Dental  (+) Teeth Intact   Pulmonary shortness of breath and with exertion, sleep apnea ,  breath sounds clear to auscultation        Cardiovascular hypertension, + angina + CAD and + Past MI Rhythm:Regular     Neuro/Psych PSYCHIATRIC DISORDERS Anxiety TIA   GI/Hepatic GERD-  Medicated and Controlled,  Endo/Other  diabetes, Type 2, Insulin DependentMorbid obesity  Renal/GU      Musculoskeletal  (+) Arthritis -, Osteoarthritis,    Abdominal   Peds  Hematology   Anesthesia Other Findings   Reproductive/Obstetrics                          Anesthesia Physical Anesthesia Plan  ASA: III  Anesthesia Plan: General and Regional   Post-op Pain Management:    Induction: Intravenous  Airway Management Planned: Oral ETT  Additional Equipment: None  Intra-op Plan:   Post-operative Plan: Extubation in OR  Informed Consent: I have reviewed the patients History and Physical, chart, labs and discussed the procedure including the risks, benefits and alternatives for the proposed anesthesia with the patient or authorized representative who has indicated his/her understanding and acceptance.   Dental advisory given  Plan Discussed with: CRNA and Surgeon  Anesthesia Plan Comments:         Anesthesia Quick Evaluation

## 2014-07-31 ENCOUNTER — Encounter (HOSPITAL_COMMUNITY): Payer: Self-pay | Admitting: Orthopedic Surgery

## 2014-07-31 DIAGNOSIS — K219 Gastro-esophageal reflux disease without esophagitis: Secondary | ICD-10-CM | POA: Diagnosis not present

## 2014-07-31 DIAGNOSIS — S43431A Superior glenoid labrum lesion of right shoulder, initial encounter: Secondary | ICD-10-CM | POA: Diagnosis not present

## 2014-07-31 DIAGNOSIS — M75101 Unspecified rotator cuff tear or rupture of right shoulder, not specified as traumatic: Secondary | ICD-10-CM | POA: Diagnosis not present

## 2014-07-31 DIAGNOSIS — I252 Old myocardial infarction: Secondary | ICD-10-CM | POA: Diagnosis not present

## 2014-07-31 DIAGNOSIS — M7541 Impingement syndrome of right shoulder: Secondary | ICD-10-CM | POA: Diagnosis not present

## 2014-07-31 DIAGNOSIS — I2510 Atherosclerotic heart disease of native coronary artery without angina pectoris: Secondary | ICD-10-CM | POA: Diagnosis not present

## 2014-07-31 DIAGNOSIS — M24011 Loose body in right shoulder: Secondary | ICD-10-CM | POA: Diagnosis not present

## 2014-07-31 DIAGNOSIS — E119 Type 2 diabetes mellitus without complications: Secondary | ICD-10-CM | POA: Diagnosis not present

## 2014-07-31 DIAGNOSIS — M7501 Adhesive capsulitis of right shoulder: Secondary | ICD-10-CM | POA: Diagnosis not present

## 2014-07-31 DIAGNOSIS — X58XXXA Exposure to other specified factors, initial encounter: Secondary | ICD-10-CM | POA: Diagnosis not present

## 2014-07-31 DIAGNOSIS — Z6826 Body mass index (BMI) 26.0-26.9, adult: Secondary | ICD-10-CM | POA: Diagnosis not present

## 2014-07-31 DIAGNOSIS — G4730 Sleep apnea, unspecified: Secondary | ICD-10-CM | POA: Diagnosis not present

## 2014-07-31 DIAGNOSIS — I10 Essential (primary) hypertension: Secondary | ICD-10-CM | POA: Diagnosis not present

## 2014-07-31 DIAGNOSIS — M19011 Primary osteoarthritis, right shoulder: Secondary | ICD-10-CM | POA: Diagnosis not present

## 2014-07-31 DIAGNOSIS — S46111A Strain of muscle, fascia and tendon of long head of biceps, right arm, initial encounter: Secondary | ICD-10-CM | POA: Diagnosis not present

## 2014-07-31 DIAGNOSIS — Z794 Long term (current) use of insulin: Secondary | ICD-10-CM | POA: Diagnosis not present

## 2014-07-31 LAB — GLUCOSE, CAPILLARY
Glucose-Capillary: 293 mg/dL — ABNORMAL HIGH (ref 70–99)
Glucose-Capillary: 272 mg/dL — ABNORMAL HIGH (ref 70–99)

## 2014-07-31 MED ORDER — OXYCODONE-ACETAMINOPHEN 5-325 MG PO TABS: 1.0000 | ORAL_TABLET | ORAL | Status: DC | PRN

## 2014-07-31 MED ORDER — HYDROCODONE-ACETAMINOPHEN 5-325 MG PO TABS: 1.0000 | ORAL_TABLET | Freq: Four times a day (QID) | ORAL | Status: DC | PRN

## 2014-07-31 NOTE — Progress Notes (Signed)
Occupational Therapy Evaluation Patient Details Name: Edward [name] MRN: [id number] DOB: Aug 26, 1941 Today's Date: 07/31/2014    History of Present Illness 73 y.o. male admitted to [facility] on 07/30/14 for elective R shoulder Subacromial decompression, debridement of the rotator cuff tear (but not repair), and manipulation under anesthesia.  Pt had significant PMHx of R frozen shoulder, HTN, CAD, TIA, MI, SOB, DM2, anxiety, Bil TKA, and back surgery.     Clinical Impression   PTA pt lived at home alone and was independent with ADLs. Difficult to assess pt's cognition vs. Reaction to pain medication, however feel that pt would benefit from [facility] due to lack of assistance at home. Pt currently requires assist for ADLs. Pt will benefit from acute OT to practice UB ADLs using compensatory techniques.     Follow Up Recommendations  Home health OT;Supervision - Intermittent    Equipment Recommendations  None recommended by OT    Recommendations for Other Services       Precautions / Restrictions Precautions Precautions: Shoulder Type of Shoulder Precautions: MD reports pt needs pendulums, can be out of the sling today (POD #1), wall finger crawls Shoulder Interventions: Shoulder sling/immobilizer;For comfort Required Braces or Orthoses: Sling Restrictions Weight Bearing Restrictions: Yes RUE Weight Bearing: Weight bearing as tolerated (per orders)      Mobility Bed Mobility Overal bed mobility: Needs Assistance Bed Mobility: Supine to Sit     Supine to sit: Min assist Sit to supine: Supervision   General bed mobility comments: Pt required hand held assist to get to EOB.   Transfers Overall transfer level: Needs assistance   Transfers: Sit to/from Stand Sit to Stand: Supervision         General transfer comment: Supervision for safety    Balance Overall balance assessment: Needs assistance Sitting-balance support: Feet supported;No upper extremity supported Sitting  balance-Leahy Scale: Good     Standing balance support: No upper extremity supported Standing balance-Leahy Scale: Good Standing balance comment: Pt with deficits, but first time up and generally mild                            ADL Overall ADL's : Needs assistance/impaired Eating/Feeding: Set up;Sitting Eating/Feeding Details (indicate cue type and reason): pt required assistance to open container Grooming: Supervision/safety;Standing   Upper Body Bathing: Minimal assitance;Sitting   Lower Body Bathing: Min guard;Sit to/from stand       Lower Body Dressing: Minimal assistance;Sit to/from stand Lower Body Dressing Details (indicate cue type and reason): assist to thread Bil feet through leg holes, likely due to socks with rubber grips Toilet Transfer: Supervision/safety;Ambulation           Functional mobility during ADLs: Supervision/safety General ADL Comments: Pt with limited ability to complete wall crawls due to pain, however performed pendulums in standing with Supervision. Educated pt using handout for precautions. Per orders, pt can d/c sling today and is WBAT.      Vision  Pt reports no change from baseline.                    Perception Perception Perception Tested?: No   Praxis Praxis Praxis tested?: Within functional limits    Pertinent Vitals/Pain Pain Assessment: 0-10 Pain Score: 9  Faces Pain Scale: Hurts little more Pain Location: Rt shoulder Pain Descriptors / Indicators: Aching;Burning Pain Intervention(s): Limited activity within patient's tolerance;Monitored during session;Patient requesting pain meds-RN notified;Repositioned     Hand Dominance Right  Extremity/Trunk Assessment Upper Extremity Assessment Upper Extremity Assessment: RUE deficits/detail RUE Deficits / Details: per MD orders, pt is WBAT and may perform wall crawls and pendulums. Sling d/c POD #1. RUE: Unable to fully assess due to pain;Unable to fully assess  due to immobilization RUE Coordination: decreased gross motor   Lower Extremity Assessment Lower Extremity Assessment: Defer to PT evaluation    Cervical / Trunk Assessment Cervical / Trunk Assessment: Normal   Communication Communication Communication: No difficulties   Cognition Arousal/Alertness: Awake/alert Behavior During Therapy: WFL for tasks assessed/performed Overall Cognitive Status: Impaired/Different from baseline Area of Impairment: Memory;Orientation;Safety/judgement;Problem solving Orientation Level: Disoriented to;Time   Memory: Decreased short-term memory   Safety/Judgement: Decreased awareness of safety   Problem Solving: Slow processing General Comments: Difficult to differentiate between impaired cognition and reaction to pain medication.       Exercises Exercises: Shoulder  Pendulum; Right; 10 reps, standing   Shoulder Instructions Shoulder Instructions Donning/doffing shirt without moving shoulder:  (pt educated; still has IV antibiotic running) Method for sponge bathing under operated UE:  (pt educated) Donning/doffing sling/immobilizer:  (reviewed with pt, however pt able to d/c today) Correct positioning of sling/immobilizer:  (reviewed with pt, however pt can d/c today) Pendulum exercises (written home exercise program): Supervision/safety Sling wearing schedule (on at all times/off for ADL's): [place] expects to be discharged to:: Private residence Living Arrangements: Alone Available Help at Discharge: Family;Available PRN/intermittently (brother lives nearby and can check on him) Type of Home: House Home Access: Stairs to enter [employer] of Steps: 2 Entrance Stairs-Rails: Right;Left Home Layout: One level         Biochemist, clinical: Standard                Prior Functioning/Environment Level of Independence: Independent        Comments: Pt drives, doesn't use any assitive device  for gait.     OT Diagnosis: Generalized weakness;Acute pain   OT Problem List: Decreased strength;Decreased range of motion;Decreased cognition;Decreased safety awareness;Decreased knowledge of precautions;Impaired UE functional use;Pain   OT Treatment/Interventions: Self-care/ADL training;Therapeutic exercise;Energy conservation;DME and/or AE instruction;Therapeutic activities;Patient/family education;Balance training    OT Goals(Current goals can be found in the care plan section) Acute Rehab OT Goals Patient Stated Goal: to go home today, and get some help at home  OT Goal Formulation: With patient Time For Goal Achievement: 08/14/14 Potential to Achieve Goals: Good ADL Goals Pt Will Perform Upper Body Bathing: with supervision;sitting Pt Will Perform Upper Body Dressing: with set-up;with supervision;sitting Pt/caregiver will Perform Home Exercise Program: Increased ROM;Right Upper extremity;With Supervision;With written HEP provided  OT Frequency: Min 1X/week   Barriers to D/C: Decreased caregiver support             End of Session Equipment Utilized During Treatment: Other (comment) (sling) Nurse Communication: Mobility status  Activity Tolerance: Patient tolerated treatment well Patient left: in chair;with call bell/phone within reach   Time: 4580-9983 OT Time Calculation (min): 23 min Charges:  OT General Charges $OT Visit: 1 Procedure OT Evaluation $Initial OT Evaluation Tier I: 1 Procedure OT Treatments $Self Care/Home Management : 8-22 mins G-Codes: OT G-codes **NOT FOR INPATIENT CLASS** Functional Assessment Tool Used: clinical judgment Functional Limitation: Self care Self Care Current Status (J8250): At least 20 percent but less than 40 percent impaired, limited or restricted Self Care Goal Status (N3976): At least 1 percent but less than 20 percent impaired, limited or restricted  Juluis Rainier 07/31/2014, 11:25 AM  Cyndie Chime,  OTR/L Occupational Therapist [phone] (pager)

## 2014-07-31 NOTE — Discharge Instructions (Signed)
Discontinued use a sling today. Start range of motion right shoulder. Discontinue dressing 2 days postoperative.

## 2014-07-31 NOTE — Evaluation (Addendum)
Physical Therapy Evaluation Patient Details Name: Edward [name] MRN: [id number] DOB: [birth year] Today's Date: 07/31/2014   History of Present Illness  73 y.o. male admitted to [facility] on 07/30/14 for elective R shoulder Subacromial decompression, debridement of the rotator cuff tear (but not repair), and manipulation under anesthesia.  Pt had significant PMHx of R frozen shoulder, HTN, CAD, TIA, MI, SOB, DM2, anxiety, Bil TKA, and back surgery.    Clinical Impression  Pt is POD #1 and is moving well, very mild unsteadiness likely related to post op meds and first time up.  Pt is concerned about being able to take care of his home with only one good arm and would benefit from a short term [place] aide if he qualifies.  PT also recommending OT consult to speak with him about adjusting his ADLs and UE exercises.  PT will continue to follow acutely.      Follow Up Recommendations Supervision - Intermittent;Other (comment);Home health PT [facility] aide)    Equipment Recommendations  None recommended by PT    Recommendations for Other Services OT consult     Precautions / Restrictions Precautions Precautions: Shoulder Type of Shoulder Precautions: MD reports pt needs pendulums, can be out of the sling today (POD #1), wall finger crawls Shoulder Interventions: Shoulder sling/immobilizer;For comfort Required Braces or Orthoses: Sling Restrictions Weight Bearing Restrictions: Yes RUE Weight Bearing: Weight bearing as tolerated (per orders)      Mobility  Bed Mobility Overal bed mobility: Needs Assistance Bed Mobility: Sit to Supine       Sit to supine: Supervision   General bed mobility comments: supervision for safety due to fast speed of movement.  Pt using momentum to get back into bed.   Transfers Overall transfer level: Needs assistance   Transfers: Sit to/from Stand Sit to Stand: Supervision         General transfer comment: Supervision for safety  Ambulation/Gait Ambulation/Gait  assistance: Supervision Ambulation Distance (Feet): 150 Feet Assistive device: None Gait Pattern/deviations: Step-through pattern;Staggering right;Staggering left Gait velocity: decreased Gait velocity interpretation: Below normal speed for age/gender General Gait Details: Pt with mildly staggering gait pattern, but did not require any external assist for LOB.  Reports being nauseated when standing, so took the emesis bag with [place] while we walked.   Stairs Stairs: Yes Stairs assistance: Supervision Stair Management: One rail Right;One rail Left;Step to pattern;Forwards Number of Stairs: 2 General stair comments: Pt encouraged to use the railing with his left arm especially when coming down as he is more unsteady and if he did lose his balance his tendancy would be to catch himself with his dominant hand (surgical arm).          Balance Overall balance assessment: Needs assistance Sitting-balance support: Feet supported;No upper extremity supported Sitting balance-Leahy Scale: Good     Standing balance support: No upper extremity supported Standing balance-Leahy Scale: Good Standing balance comment: Pt with deficits, but first time up and generally mild                             Pertinent Vitals/Pain Pain Assessment: Faces Faces Pain Scale: Hurts little more Pain Location: right shoulder Pain Descriptors / Indicators: Aching;Burning Pain Intervention(s): Limited activity within patient's tolerance;Monitored during session;Repositioned;Premedicated before session    Home Living Family/patient expects to be discharged to:: Private residence Living Arrangements: Alone Available Help at Discharge: Family;Available PRN/intermittently (brother lives nearby and can check on him) Type of  Home: House Home Access: Stairs to enter Entrance Stairs-Rails: Psychiatric nurse of Steps: 2 Home Layout: One level        Prior Function Level of Independence:  Independent         Comments: Pt drives, doesn't use any assitive device for gait.      Hand Dominance   Dominant Hand: Right    Extremity/Trunk Assessment   Upper Extremity Assessment: Defer to OT evaluation           Lower Extremity Assessment: RLE deficits/detail;LLE deficits/detail RLE Deficits / Details: h/o bil TKA, good ROM >90 degress bil, and functional strength bil   LLE Deficits / Details: h/o bil TKA, good ROM >90 degress bil, and functional strength bil       Communication   Communication: No difficulties  Cognition Arousal/Alertness: Awake/alert Behavior During Therapy: WFL for tasks assessed/performed Overall Cognitive Status: Impaired/Different from baseline Area of Impairment: Memory     Memory: Decreased short-term memory         General Comments: Pt kept telling me the same things throught the session and reported that it was Saturday (question reaction to strong pain meds?)    General Comments General comments (skin integrity, edema, etc.): Ice applied to shoulder at end of session.           Assessment/Plan    PT Assessment Patient needs continued PT services  PT Diagnosis Difficulty walking;Abnormality of gait;Acute pain   PT Problem List Decreased activity tolerance;Decreased balance;Decreased mobility;Pain  PT Treatment Interventions DME instruction;Gait training;Stair training;Functional mobility training;Therapeutic exercise;Therapeutic activities;Balance training;Neuromuscular re-education;Patient/family education;Modalities   PT Goals (Current goals can be found in the Care Plan section) Acute Rehab PT Goals Patient Stated Goal: to go home today, and get some help at home  PT Goal Formulation: With patient Time For Goal Achievement: 08/07/14 Potential to Achieve Goals: Good    Frequency Min 5X/week   Barriers to discharge Decreased caregiver support pt lives alone       End of Session Equipment Utilized During  Treatment: Other (comment) (R arm sling) Activity Tolerance: Patient limited by pain Patient left: in bed;with call bell/phone within reach Nurse Communication: Mobility status;Other (comment) (d/c recs)    Functional Assessment Tool Used: assist level Functional Limitation: Mobility: Walking and moving around Mobility: Walking and Moving Around Current Status [phone]): At least 1 percent but less than 20 percent impaired, limited or restricted Mobility: Walking and Moving Around Goal Status [id number]): 0 percent impaired, limited or restricted    Time: 7124-5809 PT Time Calculation (min): 20 min   Charges:   PT Evaluation $Initial PT Evaluation Tier I: 1 Procedure PT Treatments $Gait Training: 8-22 mins   PT G Codes:   Functional Assessment Tool Used: assist level Functional Limitation: Mobility: Walking and moving around    Brethren B. Callie Facey, PT, DPT [phone]   07/31/2014, 9:33 AM

## 2014-07-31 NOTE — Progress Notes (Signed)
Patient discharged to home accompanied by brother. Discharge instructions, follow-up information, and rx given and explained and patient stated understanding. IV was removed and patient left unit in a stable condition with all personal belongings.

## 2014-07-31 NOTE — Discharge Summary (Signed)
  Discharge to home in stable condition. 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 Followup in the office in 2 weeks. Prescription for Vicodin for pain.

## 2014-07-31 NOTE — Care Management Note (Signed)
CARE MANAGEMENT NOTE 07/31/2014  Patient:  Edward [name], Edward [name]   Account Number:  [id number]  Date Initiated:  07/31/2014  Documentation initiated by:  Ricki Miller  Subjective/Objective Assessment:   73 yr old  admitted with right frozen shoulder. Patient underwent right shoulderscope.     Action/Plan:   Case manager spoke with patient concerning home health needs. Choice offered. Referral called to Rhett Bannister, [facility] Liaison.   Anticipated DC Date:  07/31/2014   Anticipated DC Plan:  [place]  CM consult      [facility] Choice  HOME HEALTH   Choice offered to / List presented to:  C-1 Patient   DME arranged  NA        [place] arranged  [place] agency  [place]   Status of service:  Completed, signed off Medicare Important Message given?   (If response is "NO", the following Medicare IM given date fields will be blank) Date Medicare IM given:   Medicare IM given by:   Date Additional Medicare IM given:   Additional Medicare IM given by:    Discharge Disposition:  [place]  Per UR Regulation:  Reviewed for med. necessity/level of care/duration of stay

## 2014-08-01 ENCOUNTER — Encounter: Payer: Self-pay | Admitting: Family Medicine

## 2014-08-01 DIAGNOSIS — F419 Anxiety disorder, unspecified: Secondary | ICD-10-CM | POA: Diagnosis not present

## 2014-08-01 DIAGNOSIS — M75100 Unspecified rotator cuff tear or rupture of unspecified shoulder, not specified as traumatic: Secondary | ICD-10-CM | POA: Diagnosis not present

## 2014-08-01 DIAGNOSIS — I2510 Atherosclerotic heart disease of native coronary artery without angina pectoris: Secondary | ICD-10-CM | POA: Diagnosis not present

## 2014-08-01 DIAGNOSIS — E119 Type 2 diabetes mellitus without complications: Secondary | ICD-10-CM | POA: Diagnosis not present

## 2014-08-01 DIAGNOSIS — Z4889 Encounter for other specified surgical aftercare: Secondary | ICD-10-CM | POA: Diagnosis not present

## 2014-08-01 DIAGNOSIS — M19019 Primary osteoarthritis, unspecified shoulder: Secondary | ICD-10-CM | POA: Diagnosis not present

## 2014-08-01 DIAGNOSIS — I10 Essential (primary) hypertension: Secondary | ICD-10-CM | POA: Diagnosis not present

## 2014-08-06 ENCOUNTER — Other Ambulatory Visit: Payer: Self-pay | Admitting: *Deleted

## 2014-08-06 MED ORDER — INSULIN ASPART PROT & ASPART (70-30 MIX) 100 UNIT/ML ~~LOC~~ SUSP: SUBCUTANEOUS | Status: DC

## 2014-08-07 ENCOUNTER — Telehealth: Payer: Self-pay | Admitting: *Deleted

## 2014-08-07 ENCOUNTER — Other Ambulatory Visit: Payer: Self-pay | Admitting: *Deleted

## 2014-08-07 MED ORDER — INSULIN NPH (HUMAN) (ISOPHANE) 100 UNIT/ML ~~LOC~~ SUSP: SUBCUTANEOUS | Status: DC

## 2014-08-07 MED ORDER — INSULIN NPH ISOPHANE & REGULAR (70-30) 100 UNIT/ML ~~LOC~~ SUSP: SUBCUTANEOUS | Status: DC

## 2014-08-07 NOTE — Telephone Encounter (Signed)
OK, we can switch from NovoLog to Novolin - same dosesBUT he needs to inject 30 min before a meal now: Insulin  Before breakfast  Before lunch  Before dinner   Novolin 70/30  40  20  40   Novolin 15  15  15    Can you please call the above to his pharmacy? He needs Rx's for both Novolin 70/30 and Novolin (see above).

## 2014-08-07 NOTE — Telephone Encounter (Signed)
Received fax from pt's pharmacy. Pt requesting to switch to Novolin 70/30. The cost is cheaper. Please advise.

## 2014-08-08 DIAGNOSIS — M19019 Primary osteoarthritis, unspecified shoulder: Secondary | ICD-10-CM | POA: Diagnosis not present

## 2014-08-08 DIAGNOSIS — M75100 Unspecified rotator cuff tear or rupture of unspecified shoulder, not specified as traumatic: Secondary | ICD-10-CM | POA: Diagnosis not present

## 2014-08-08 DIAGNOSIS — E119 Type 2 diabetes mellitus without complications: Secondary | ICD-10-CM | POA: Diagnosis not present

## 2014-08-08 DIAGNOSIS — Z4889 Encounter for other specified surgical aftercare: Secondary | ICD-10-CM | POA: Diagnosis not present

## 2014-08-08 DIAGNOSIS — I2510 Atherosclerotic heart disease of native coronary artery without angina pectoris: Secondary | ICD-10-CM | POA: Diagnosis not present

## 2014-08-08 DIAGNOSIS — I10 Essential (primary) hypertension: Secondary | ICD-10-CM | POA: Diagnosis not present

## 2014-08-11 ENCOUNTER — Ambulatory Visit (INDEPENDENT_AMBULATORY_CARE_PROVIDER_SITE_OTHER): Payer: Medicare Other | Admitting: Ophthalmology

## 2014-08-11 DIAGNOSIS — H35033 Hypertensive retinopathy, bilateral: Secondary | ICD-10-CM

## 2014-08-11 DIAGNOSIS — E11339 Type 2 diabetes mellitus with moderate nonproliferative diabetic retinopathy without macular edema: Secondary | ICD-10-CM | POA: Diagnosis not present

## 2014-08-11 DIAGNOSIS — I10 Essential (primary) hypertension: Secondary | ICD-10-CM | POA: Diagnosis not present

## 2014-08-11 DIAGNOSIS — E11319 Type 2 diabetes mellitus with unspecified diabetic retinopathy without macular edema: Secondary | ICD-10-CM

## 2014-08-11 DIAGNOSIS — E11359 Type 2 diabetes mellitus with proliferative diabetic retinopathy without macular edema: Secondary | ICD-10-CM | POA: Diagnosis not present

## 2014-08-11 DIAGNOSIS — H43813 Vitreous degeneration, bilateral: Secondary | ICD-10-CM

## 2014-08-12 ENCOUNTER — Ambulatory Visit: Payer: Medicare Other | Attending: Orthopedic Surgery | Admitting: Physical Therapy

## 2014-08-12 DIAGNOSIS — I10 Essential (primary) hypertension: Secondary | ICD-10-CM | POA: Insufficient documentation

## 2014-08-12 DIAGNOSIS — M75101 Unspecified rotator cuff tear or rupture of right shoulder, not specified as traumatic: Secondary | ICD-10-CM | POA: Diagnosis not present

## 2014-08-12 DIAGNOSIS — I2510 Atherosclerotic heart disease of native coronary artery without angina pectoris: Secondary | ICD-10-CM | POA: Diagnosis not present

## 2014-08-12 DIAGNOSIS — Z5189 Encounter for other specified aftercare: Secondary | ICD-10-CM | POA: Diagnosis not present

## 2014-08-12 DIAGNOSIS — M7501 Adhesive capsulitis of right shoulder: Secondary | ICD-10-CM | POA: Diagnosis not present

## 2014-08-12 DIAGNOSIS — I200 Unstable angina: Secondary | ICD-10-CM | POA: Diagnosis not present

## 2014-08-12 DIAGNOSIS — E119 Type 2 diabetes mellitus without complications: Secondary | ICD-10-CM | POA: Diagnosis not present

## 2014-08-18 ENCOUNTER — Ambulatory Visit: Payer: Medicare Other | Admitting: Physical Therapy

## 2014-08-18 DIAGNOSIS — Z5189 Encounter for other specified aftercare: Secondary | ICD-10-CM | POA: Diagnosis not present

## 2014-08-21 ENCOUNTER — Encounter: Payer: Medicare Other | Admitting: Physical Therapy

## 2014-08-26 ENCOUNTER — Ambulatory Visit: Payer: Medicare Other | Admitting: Physical Therapy

## 2014-08-28 ENCOUNTER — Ambulatory Visit: Payer: Medicare Other | Admitting: Physical Therapy

## 2014-08-28 ENCOUNTER — Ambulatory Visit: Payer: Medicare Other | Admitting: Internal Medicine

## 2014-08-28 DIAGNOSIS — Z5189 Encounter for other specified aftercare: Secondary | ICD-10-CM | POA: Diagnosis not present

## 2014-09-15 ENCOUNTER — Encounter: Payer: Self-pay | Admitting: Family Medicine

## 2014-09-16 ENCOUNTER — Telehealth: Payer: Self-pay | Admitting: Neurology

## 2014-09-16 NOTE — Telephone Encounter (Signed)
Pt called to cancel his EMG study for 09/22/14. Pt had recent shoulder surgery and will call later to r/s. Pt is in a lot of pain.

## 2014-09-22 ENCOUNTER — Encounter: Payer: Medicare Other | Admitting: Neurology

## 2014-10-06 DIAGNOSIS — M7551 Bursitis of right shoulder: Secondary | ICD-10-CM | POA: Diagnosis not present

## 2014-10-06 DIAGNOSIS — M7501 Adhesive capsulitis of right shoulder: Secondary | ICD-10-CM | POA: Diagnosis not present

## 2014-10-09 ENCOUNTER — Encounter (HOSPITAL_COMMUNITY): Payer: Self-pay | Admitting: Interventional Cardiology

## 2014-10-21 ENCOUNTER — Emergency Department (HOSPITAL_COMMUNITY)
Admission: EM | Admit: 2014-10-21 | Discharge: 2014-10-21 | Disposition: A | Discharge status: Home / Self Care | Payer: Medicare Other | Attending: Emergency Medicine | Admitting: Emergency Medicine

## 2014-10-21 ENCOUNTER — Encounter (HOSPITAL_COMMUNITY): Payer: Self-pay | Admitting: Cardiology

## 2014-10-21 ENCOUNTER — Emergency Department (HOSPITAL_COMMUNITY): Payer: Medicare Other

## 2014-10-21 DIAGNOSIS — W01198A Fall on same level from slipping, tripping and stumbling with subsequent striking against other object, initial encounter: Secondary | ICD-10-CM | POA: Diagnosis not present

## 2014-10-21 DIAGNOSIS — Y93G3 Activity, cooking and baking: Secondary | ICD-10-CM | POA: Diagnosis not present

## 2014-10-21 DIAGNOSIS — Z79899 Other long term (current) drug therapy: Secondary | ICD-10-CM | POA: Diagnosis not present

## 2014-10-21 DIAGNOSIS — I2510 Atherosclerotic heart disease of native coronary artery without angina pectoris: Secondary | ICD-10-CM | POA: Insufficient documentation

## 2014-10-21 DIAGNOSIS — Z8673 Personal history of transient ischemic attack (TIA), and cerebral infarction without residual deficits: Secondary | ICD-10-CM | POA: Diagnosis not present

## 2014-10-21 DIAGNOSIS — I10 Essential (primary) hypertension: Secondary | ICD-10-CM | POA: Insufficient documentation

## 2014-10-21 DIAGNOSIS — K219 Gastro-esophageal reflux disease without esophagitis: Secondary | ICD-10-CM | POA: Diagnosis not present

## 2014-10-21 DIAGNOSIS — Z9889 Other specified postprocedural states: Secondary | ICD-10-CM | POA: Diagnosis not present

## 2014-10-21 DIAGNOSIS — Z7982 Long term (current) use of aspirin: Secondary | ICD-10-CM | POA: Diagnosis not present

## 2014-10-21 DIAGNOSIS — S59901A Unspecified injury of right elbow, initial encounter: Secondary | ICD-10-CM | POA: Diagnosis not present

## 2014-10-21 DIAGNOSIS — Z794 Long term (current) use of insulin: Secondary | ICD-10-CM | POA: Insufficient documentation

## 2014-10-21 DIAGNOSIS — M1990 Unspecified osteoarthritis, unspecified site: Secondary | ICD-10-CM | POA: Diagnosis not present

## 2014-10-21 DIAGNOSIS — E119 Type 2 diabetes mellitus without complications: Secondary | ICD-10-CM | POA: Diagnosis not present

## 2014-10-21 DIAGNOSIS — Z8701 Personal history of pneumonia (recurrent): Secondary | ICD-10-CM | POA: Insufficient documentation

## 2014-10-21 DIAGNOSIS — Y92009 Unspecified place in unspecified non-institutional (private) residence as the place of occurrence of the external cause: Secondary | ICD-10-CM

## 2014-10-21 DIAGNOSIS — Y9289 Other specified places as the place of occurrence of the external cause: Secondary | ICD-10-CM | POA: Diagnosis not present

## 2014-10-21 DIAGNOSIS — F419 Anxiety disorder, unspecified: Secondary | ICD-10-CM | POA: Insufficient documentation

## 2014-10-21 DIAGNOSIS — S59902A Unspecified injury of left elbow, initial encounter: Secondary | ICD-10-CM | POA: Insufficient documentation

## 2014-10-21 DIAGNOSIS — S8992XA Unspecified injury of left lower leg, initial encounter: Secondary | ICD-10-CM | POA: Diagnosis not present

## 2014-10-21 DIAGNOSIS — M25511 Pain in right shoulder: Secondary | ICD-10-CM | POA: Diagnosis not present

## 2014-10-21 DIAGNOSIS — Z87448 Personal history of other diseases of urinary system: Secondary | ICD-10-CM | POA: Diagnosis not present

## 2014-10-21 DIAGNOSIS — S79911A Unspecified injury of right hip, initial encounter: Secondary | ICD-10-CM | POA: Diagnosis not present

## 2014-10-21 DIAGNOSIS — Y998 Other external cause status: Secondary | ICD-10-CM | POA: Insufficient documentation

## 2014-10-21 DIAGNOSIS — W19XXXA Unspecified fall, initial encounter: Secondary | ICD-10-CM

## 2014-10-21 DIAGNOSIS — S0990XA Unspecified injury of head, initial encounter: Secondary | ICD-10-CM | POA: Insufficient documentation

## 2014-10-21 DIAGNOSIS — Z8669 Personal history of other diseases of the nervous system and sense organs: Secondary | ICD-10-CM | POA: Diagnosis not present

## 2014-10-21 MED ORDER — CYCLOBENZAPRINE HCL 5 MG PO TABS: 5.0000 mg | ORAL_TABLET | Freq: Three times a day (TID) | ORAL | Status: DC | PRN

## 2014-10-21 MED ORDER — CYCLOBENZAPRINE HCL 10 MG PO TABS
5.0000 mg | ORAL_TABLET | Freq: Once | ORAL | Status: AC
  Administered 2014-10-21: 5 mg via ORAL
  Filled 2014-10-21: qty 1

## 2014-10-21 NOTE — ED Provider Notes (Signed)
CSN: [id number]     Arrival date & time 10/21/14  1300 History   First MD Initiated Contact with Patient 10/21/14 1519     Chief Complaint  Patient presents with  . Fall    HPI  Edward [name] is a 73 year old male who presents today after sustaining a fall. This AM, he was preparing breakfast when he slipped on some grease and hit his head against the wall while bracing himself with his left side before ultimately falling down. He is able to recall all the events and reports some tenderness in a postauricular area along with pain on his right side that is chronic from a surgery done 2-3 months ago. He denies any prior history of fractures, other pain, dizziness.   Past Medical History  Diagnosis Date  . Hypertension   . Arthritis   . CAD (coronary artery disease)     a. Pt reports "small heart attack" in 2013 at [facility], denies need for stent or CABG, does not know findings of cath.  Marland Kitchen TIA (transient ischemic attack) 2002  . Dyslipidemia, goal LDL below 70 01/29/2014  . Heart attack 02/03/2014    "mild heart attack"  . GERD (gastroesophageal reflux disease)   . Enlarged prostate   . Shortness of breath     exertion  . Sleep apnea     does not use cpap  . Pneumonia 01/2013  . Pneumonia 03/2014    hospitalized  . DM type 2 (diabetes mellitus, type 2), insulin dependent 01/29/2014    fasting cbg 50-120 with new regimen  . Anxiety   . HWTUUEKC(003.4)    Past Surgical History  Procedure Laterality Date  . Total knee arthroplasty Bilateral   . Cardiac catheterization  01/28/14    + CAD treat medically  . Lumbar disc surgery    . Cataract extraction Bilateral   . Joint replacement Bilateral     knee   . Back surgery    . Eye surgery Bilateral   . Shoulder arthroscopy Right 07/30/2014    Procedure: Right Shoulder Arthroscopy, Debridement, Decompression, Manipulation Under Anesthesia;  Surgeon: Newt Minion, MD;  Location: [facility];  Service: Orthopedics;  Laterality:  Right;  . Left heart catheterization with coronary angiogram N/A 01/28/2014    Procedure: LEFT HEART CATHETERIZATION WITH CORONARY ANGIOGRAM;  Surgeon: Sinclair Grooms, MD;  Location: [facility] CATH LAB;  Service: Cardiovascular;  Laterality: N/A;   Family History  Problem Relation Age of Onset  . CAD Brother     2 brothers - CABG  . Alzheimer's disease Sister   . CAD Sister   . Prostate cancer Brother   . Diabetes Other     entire family   History  Substance Use Topics  . Smoking status: Never Smoker   . Smokeless tobacco: Never Used  . Alcohol Use: No    Review of Systems  Musculoskeletal: Negative for back pain and neck pain.       R shoulder & leg pain which he reports as chronic  Neurological: Negative for dizziness.  All other systems reviewed and are negative.     Allergies  Review of patient's allergies indicates no known allergies.  Home Medications   Prior to Admission medications   Medication Sig Start Date End Date Taking? Authorizing Provider  amLODipine (NORVASC) 10 MG tablet Take 1 tablet (10 mg total) by mouth daily. 01/29/14   Isaiah Serge, NP  aspirin EC 81 MG tablet Take 81  mg by mouth daily.    Historical Provider, MD  Blood Glucose Monitoring Suppl (ONE TOUCH ULTRA SYSTEM KIT) W/DEVICE KIT Use to test blood sugar daily as instructed. Dx code: 250.00 02/21/14   Philemon Kingdom, MD  clonazePAM (KLONOPIN) 1 MG tablet Take 1 tablet (1 mg total) by mouth at bedtime as needed for anxiety. 07/17/14   Alferd Apa Lowne, DO  glucose blood (BL TEST STRIP PACK) test strip Use to test blood sugar 4 times daily as instructed. Dx code: 250.00 02/21/14   Philemon Kingdom, MD  HYDROcodone-acetaminophen (NORCO) 5-325 MG per tablet Take 1 tablet by mouth every 6 (six) hours as needed. 07/31/14   Newt Minion, MD  insulin aspart (NOVOLOG) 100 UNIT/ML injection Inject 12-20 Units into the skin 3 (three) times daily with meals. 12 units (mixed with 70/30) in the morning, 20 units at  lunch, 12 units (mixed with 70/30)in the evening. **NO 70/30 at lunch. 07/25/14   Philemon Kingdom, MD  insulin aspart protamine- aspart (NOVOLOG MIX 70/30) (70-30) 100 UNIT/ML injection Inject 40 units at breakfast, 20 units at lunch and 40 units at dinner. 08/06/14   Philemon Kingdom, MD  insulin NPH Human (NOVOLIN N) 100 UNIT/ML injection Inject 15 units before breakfast, 15 units before lunch and 15 units before dinner. 08/07/14   Philemon Kingdom, MD  insulin NPH-regular Human (NOVOLIN 70/30) (70-30) 100 UNIT/ML injection Inject insulin 30 minutes before meals, 40 units before breakfast, 20 units before lunch and 40 units before dinner. 08/07/14   Philemon Kingdom, MD  Insulin Syringes, Disposable, U-100 1 ML MISC 60 Units by Does not apply route 4 (four) times daily. 06/03/14   Philemon Kingdom, MD  Lancets [facility] ULTRASOFT) lancets Use to test blood sugar 4 times daily as instructed. Dx code: 250.00 02/21/14   Philemon Kingdom, MD  lisinopril (PRINIVIL,ZESTRIL) 20 MG tablet Take 1 tablet (20 mg total) by mouth daily. 07/17/14   Rosalita Chessman, DO  metoprolol (LOPRESSOR) 50 MG tablet Take 1 tablet (50 mg total) by mouth 2 (two) times daily. 01/29/14   Isaiah Serge, NP  nitroGLYCERIN (NITROSTAT) 0.4 MG SL tablet Place 1 tablet (0.4 mg total) under the tongue every 5 (five) minutes as needed for chest pain (CP or SOB). 01/29/14   Isaiah Serge, NP  oxyCODONE-acetaminophen (ROXICET) 5-325 MG per tablet Take 1 tablet by mouth every 4 (four) hours as needed for severe pain. 07/31/14   Newt Minion, MD  pantoprazole (PROTONIX) 40 MG tablet Take 1 tablet (40 mg total) by mouth daily at 6 (six) AM. 07/17/14   Alferd Apa Lowne, DO   BP 173/77 mmHg  Pulse 99  Temp(Src) 98.8 F (37.1 C) (Oral)  Resp 20  Ht _0  (1.854 m)  Wt 280 lb (127.007 kg)  BMI 36.95 kg/m2  SpO2 99% Physical Exam  Constitutional: He is oriented to person, place, and time. He appears well-developed and well-nourished. No distress.   HENT:  Head: Normocephalic and atraumatic.  Mouth/Throat: Oropharynx is clear and moist. No oropharyngeal exudate.  Eyes: Conjunctivae are normal. Pupils are equal, round, and reactive to light.  Cardiovascular: Normal rate, regular rhythm and normal heart sounds.  Exam reveals no gallop and no friction rub.   No murmur heard. Pulmonary/Chest: Effort normal and breath sounds normal. No respiratory distress. He has no wheezes. He has no rales.  Abdominal: Soft. Bowel sounds are normal. He exhibits no distension. There is no tenderness. There is no rebound.  Musculoskeletal:  R hip flexion 4/5, extension 4/5. LLE 5/5. Unable to flex elbows past midline bilaterally.   Neurological: He is alert and oriented to person, place, and time. No cranial nerve deficit.  Skin: He is not diaphoretic.    ED Course  Procedures (including critical care time) Labs Review Labs Reviewed - No data to display  Imaging Review Ct Head Wo Contrast  10/21/2014   CLINICAL DATA:  Fall this morning, on aspirin.  EXAM: CT HEAD WITHOUT CONTRAST  TECHNIQUE: Contiguous axial images were obtained from the base of the skull through the vertex without intravenous contrast.  COMPARISON:  None.  FINDINGS: No intracranial hemorrhage, mass effect, or midline shift. No hydrocephalus. The basilar cisterns are patent. No evidence of territorial infarct. No intracranial fluid collection. Calvarium is intact. Included paranasal sinuses and mastoid air cells are well aerated.  IMPRESSION: No acute intracranial process.   Electronically Signed   By: Jeb Levering M.D.   On: 10/21/2014 16:16     EKG Interpretation None      MDM   Final diagnoses:  Fall at home, initial encounter    Physical exam findings reassuring for no fracture. Will check head CT to r/o intracranial bleed.  510PM: Head CT unremarkable. Tachycardic with [id number] though was initially in the upper 80s and suspect 2/2 pain. Responded well to Flexiril 3m x 1  and will discharge him on this medication.   RCharlott Rakes MD 10/21/14 1[place] MD 10/27/14 [phone]

## 2014-10-21 NOTE — ED Notes (Signed)
Pt reports that he fell this morning and hit the back of his head. Reports that he takes asa, denies any LOC. Pt A&Ox4 at triage.

## 2014-11-03 DIAGNOSIS — I739 Peripheral vascular disease, unspecified: Secondary | ICD-10-CM | POA: Diagnosis not present

## 2014-11-03 DIAGNOSIS — E1151 Type 2 diabetes mellitus with diabetic peripheral angiopathy without gangrene: Secondary | ICD-10-CM | POA: Diagnosis not present

## 2014-11-03 DIAGNOSIS — L603 Nail dystrophy: Secondary | ICD-10-CM | POA: Diagnosis not present

## 2014-11-03 DIAGNOSIS — L84 Corns and callosities: Secondary | ICD-10-CM | POA: Diagnosis not present

## 2014-11-05 ENCOUNTER — Encounter: Payer: Self-pay | Admitting: Family Medicine

## 2014-11-13 NOTE — Telephone Encounter (Signed)
Discussed with patient and made him aware that [place] would be happy to see his brother, but he will need to call back because we would need his personal information to put him into the system. He said he would have him call back on Monday.     KP

## 2014-11-13 NOTE — Telephone Encounter (Signed)
I don't know what I have available but I'm happy to see him

## 2014-11-17 ENCOUNTER — Ambulatory Visit: Payer: Medicare Other | Admitting: Family Medicine

## 2014-11-19 ENCOUNTER — Other Ambulatory Visit: Payer: Self-pay | Admitting: Family Medicine

## 2014-11-19 DIAGNOSIS — F419 Anxiety disorder, unspecified: Secondary | ICD-10-CM

## 2014-11-19 DIAGNOSIS — E11329 Type 2 diabetes mellitus with mild nonproliferative diabetic retinopathy without macular edema: Secondary | ICD-10-CM | POA: Diagnosis not present

## 2014-11-19 DIAGNOSIS — H40023 Open angle with borderline findings, high risk, bilateral: Secondary | ICD-10-CM | POA: Diagnosis not present

## 2014-11-19 DIAGNOSIS — H02403 Unspecified ptosis of bilateral eyelids: Secondary | ICD-10-CM | POA: Diagnosis not present

## 2014-11-19 MED ORDER — CLONAZEPAM 1 MG PO TABS: 1.0000 mg | ORAL_TABLET | Freq: Every evening | ORAL | Status: DC | PRN

## 2014-11-19 NOTE — Addendum Note (Signed)
Addended by: Ewing Schlein on: 11/19/2014 10:56 AM   Modules accepted: Orders

## 2014-11-19 NOTE — Telephone Encounter (Signed)
Last seen and filled 07/17/14 #30 No UDS-- apt pending  Please advise     KP

## 2014-11-20 ENCOUNTER — Ambulatory Visit (INDEPENDENT_AMBULATORY_CARE_PROVIDER_SITE_OTHER): Payer: Medicare Other | Admitting: Family Medicine

## 2014-11-20 ENCOUNTER — Encounter: Payer: Self-pay | Admitting: Family Medicine

## 2014-11-20 VITALS — BP 132/78 | HR 93 | Temp 97.7°F | Wt 269.8 lb

## 2014-11-20 DIAGNOSIS — L219 Seborrheic dermatitis, unspecified: Secondary | ICD-10-CM

## 2014-11-20 DIAGNOSIS — E785 Hyperlipidemia, unspecified: Secondary | ICD-10-CM

## 2014-11-20 DIAGNOSIS — E1165 Type 2 diabetes mellitus with hyperglycemia: Secondary | ICD-10-CM | POA: Diagnosis not present

## 2014-11-20 DIAGNOSIS — H6121 Impacted cerumen, right ear: Secondary | ICD-10-CM | POA: Insufficient documentation

## 2014-11-20 DIAGNOSIS — H6123 Impacted cerumen, bilateral: Secondary | ICD-10-CM

## 2014-11-20 DIAGNOSIS — IMO0002 Reserved for concepts with insufficient information to code with codable children: Secondary | ICD-10-CM

## 2014-11-20 DIAGNOSIS — L218 Other seborrheic dermatitis: Secondary | ICD-10-CM | POA: Diagnosis not present

## 2014-11-20 LAB — HEPATIC FUNCTION PANEL
ALBUMIN: 3.5 g/dL (ref 3.5–5.2)
ALT: 15 U/L (ref 0–53)
AST: 15 U/L (ref 0–37)
Alkaline Phosphatase: 106 U/L (ref 39–117)
BILIRUBIN TOTAL: 0.3 mg/dL (ref 0.2–1.2)
Bilirubin, Direct: 0 mg/dL (ref 0.0–0.3)
Total Protein: 6.2 g/dL (ref 6.0–8.3)

## 2014-11-20 LAB — LIPID PANEL
CHOL/HDL RATIO: 5
CHOLESTEROL: 170 mg/dL (ref 0–200)
HDL: 37.1 mg/dL — AB (ref >39.00)
LDL CALC: 107 mg/dL — AB (ref 0–99)
NONHDL: 132.9
Triglycerides: 130 mg/dL (ref 0.0–149.0)
VLDL: 26 mg/dL (ref 0.0–40.0)

## 2014-11-20 MED ORDER — CICLOPIROX 1 % EX SHAM: MEDICATED_SHAMPOO | CUTANEOUS | Status: DC

## 2014-11-20 NOTE — Patient Instructions (Signed)

## 2014-11-20 NOTE — Progress Notes (Signed)
    Subjective:     HPI: Edward [name] is a 74 y.o. male here for follow up of dyslipidemia. The patient does not use medications that may worsen dyslipidemias (corticosteroids, progestins, anabolic steroids, diuretics, beta-blockers, amiodarone, cyclosporine, olanzapine). The patient exercises never. The patient is known to have coexisting coronary artery disease.   He also c/o ears feeling full and sometimes painful and is requesting a derm referral.   Review of Systems: Review of Systems - General ROS: negative ENT ROS: positive for - cerumen impaction Respiratory ROS: no cough, shortness of breath, or wheezing Cardiovascular ROS: no chest pain or dyspnea on exertion Dermatological ROS: dry scaly scalp Objective:    Filed Vitals:   11/20/14 1015  BP: 132/78  Pulse: 93  Temp: 97.7 F (36.5 C)    Physical Exam: General Appearance:  dry flaky skin on scalp Neck:  neck- supple, no mass, non-tender and no bruits Lungs:  Normal expansion.  Clear to auscultation.  No rales, rhonchi, or wheezing. Heart:  Heart sounds are normal.  Regular rate and rhythm without murmur, gallop or rub. Extremities: Extremities warm to touch, pink, with no edema. Ear--b/l cerumen impaction  Lab Review Lab Results  Component Value Date   CHOL 170 11/20/2014   CHOL 181 07/17/2014   CHOL 151 01/28/2014   HDL 37.10* 11/20/2014   HDL 40.70 07/17/2014   HDL 32* 01/28/2014       Assessment:     Problem List Items Addressed This Visit    None    Visit Diagnoses    Hyperlipidemia    -  Primary    Relevant Orders    Hepatic function panel (Completed)    Lipid panel (Completed)    Diabetes mellitus type II, uncontrolled        Seborrheic dermatitis of scalp        Relevant Medications    Ciclopirox 1 % shampoo    Other Relevant Orders    Ambulatory referral to Dermatology           Plan:    The following changes are planned for the next 6 months, at which time the patient will  return for repeat fasting lipids:  1. Dietary recommendations: Reduce saturated fat, "trans" monounsaturated fatty acids, and cholesterol 2. Exercise recommendations:  At least 25 minutes of vigorous aerobic activity at least 3 days per week for a total of 75 minutes 3. Other treatment: Treatment of hypertension (-) Weight reduction (-)  Return in about 6 months (around 05/21/2015), or if symptoms worsen or fail to improve, for f/u and labs.  1. Hyperlipidemia Check labs - Hepatic function panel - Lipid panel  2. Diabetes mellitus type II, uncontrolled Cont meds--insulin etc-- per endo   3. Seborrheic dermatitis of scalp Pt is requesting derm referral - Ambulatory referral to Dermatology - Ciclopirox 1 % shampoo; Massage in scalp 3x a week and leave on for 10 min and rinse  Dispense: 120 mL; Refill: 0

## 2014-11-20 NOTE — Progress Notes (Signed)
Pre visit review using our clinic review tool, if applicable. No additional management support is needed unless otherwise documented below in the visit note. 

## 2014-11-20 NOTE — Assessment & Plan Note (Signed)
Use debrox and rto in a week or so to irrigate ears

## 2014-12-01 ENCOUNTER — Other Ambulatory Visit: Payer: Self-pay

## 2014-12-01 ENCOUNTER — Ambulatory Visit (INDEPENDENT_AMBULATORY_CARE_PROVIDER_SITE_OTHER): Payer: Medicare Other | Admitting: Neurology

## 2014-12-01 DIAGNOSIS — E0842 Diabetes mellitus due to underlying condition with diabetic polyneuropathy: Secondary | ICD-10-CM | POA: Diagnosis not present

## 2014-12-01 DIAGNOSIS — G5621 Lesion of ulnar nerve, right upper limb: Secondary | ICD-10-CM

## 2014-12-01 DIAGNOSIS — E1142 Type 2 diabetes mellitus with diabetic polyneuropathy: Secondary | ICD-10-CM

## 2014-12-01 MED ORDER — PRAVASTATIN SODIUM 20 MG PO TABS
20.0000 mg | ORAL_TABLET | Freq: Every day | ORAL | Status: DC
Start: 2014-12-01 — End: 2015-02-12

## 2014-12-01 NOTE — Procedures (Signed)
[facility]  [place], [place]  [place], [place] [address] Tel: [phone] Fax:  [phone] Test Date:  12/01/2014  Patient: Edward [name] DOB: 10/19/1941 Physician: Narda Amber, DO  Sex: Male Height: 5\' 8"  Ref Phys:   ID#: [id number] Temp: 36.0C Technician: Laureen Ochs R. NCS T.   Patient Complaints: Patient is a 74 year old male here for evaluation of generalized paresthesias of his arms and legs, worse on the right side.   NCV & EMG Findings: Extensive electrodiagnostic testing of the right upper and lower extremity shows:  1. Right median sensory response shows prolonged latency and reduced amplitude. Right ulnar sensory response is absent. Right radial sensory responses within normal limits. 2. Right ulnar motor response shows prolonged latency and markedly reduced amplitude with conduction velocity slowing across the elbow. Right median motor responses within normal limits 3. Absent sural and superficial peroneal sensory responses.  4. Peroneal motor response (EDB) and tibial motor response (AH) are reduced bilaterally. The peroneal motor response recording at the tibialis anterior is within normal limits. 5. Chronic motor axon loss changes are seen affecting the flexor digitorum longus and ulnar innervated muscles, without accompanied active denervation.  Impression: 1. 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 Overall, these findings are moderate in the severe in degree electrically.  2. 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 There is no evidence of a cervical/lumbosacral radiculopathy affecting the right side   ___________________________ Narda Amber, DO    Nerve Conduction Studies Anti Sensory Summary Table   Site NR Peak (ms) Norm Peak (ms) P-T Amp (V) Norm P-T Amp    Right Median Anti Sensory (2nd Digit)  36C  Wrist    3.9 <3.8 6.9 >10  Right Radial Anti Sensory (Base 1st Digit)  36C  Wrist    2.8 <2.8 13.5 >10  Right Sup Peroneal Anti Sensory (Ant Lat Mall)  12 cm NR  <4.6  >3  Right Sural Anti Sensory (Lat Mall)  Calf NR  <4.6  >3  Right Ulnar Anti Sensory (5th Digit)  36C  Wrist NR  <3.2  >5   Motor Summary Table   Site NR Onset (ms) Norm Onset (ms) O-P Amp (mV) Norm O-P Amp Site1 Site2 Delta-0 (ms) Dist (cm) Vel (m/s) Norm Vel (m/s)  Right Median Motor (Abd Poll Brev)  36C  Wrist    3.7 <4.0 11.4 >5 Elbow Wrist 5.8 30.0 52 >50  Elbow    9.5  9.7         Right Peroneal Motor (Ext Dig Brev)  Ankle    3.9 <6.0 1.1 >2.5 B Fib Ankle 9.2 33.0 36 >40  B Fib    13.1  0.7  Poplt B Fib 1.4 8.0 57 >40  Poplt    14.5  0.9         Right Peroneal TA Motor (Tib Ant)  Fib Head    3.3 <4.5 3.7 >3 Poplit Fib Head 1.7 10.0 59 >40  Poplit    5.0  3.1         Right Tibial Motor (Abd Hall Brev)  Ankle    4.2 <6.0 3.4 >4 Knee Ankle 10.3 42.0 41 >40  Knee    14.5  0.6         Right Ulnar Motor (Abd Dig Minimi)  36C  Wrist  3.2 <3.1 3.8 >7 B Elbow Wrist 4.5 26.5 59 >50  B Elbow    7.7  3.3  A Elbow B Elbow 2.5 10.0 40 >50  A Elbow    10.2  3.2          F Wave Studies   NR F-Lat (ms) Lat Norm (ms) L-R F-Lat (ms)  Right Tibial (Mrkrs) (Abd Hallucis)     59.67 <55   Right Ulnar (Mrkrs) (Abd Dig Min)  36C  NR  <33    EMG   Side Muscle Ins Act Fibs Psw Fasc Number Recrt Dur Dur. Amp Amp. Poly Poly. Comment  Right AntTibialis Nml Nml Nml Nml Nml Nml Nml Nml Nml Nml Nml Nml N/A  Right Gastroc Nml Nml Nml Nml Nml Nml Nml Nml Nml Nml Nml Nml N/A  Right Flex Dig Long Nml Nml Nml Nml 1- Mod-R Few 1+ Few 1+ Nml Nml N/A  Right RectFemoris Nml Nml Nml Nml Nml Nml Nml Nml Nml Nml Nml Nml N/A  Right GluteusMed Nml Nml Nml Nml Nml Nml Nml Nml Nml Nml Nml Nml N/A  Right 1stDorInt Nml Nml Nml Nml 1- Rapid Some 1+ Some 1+ Nml Nml N/A  Right Abd Poll Brev Nml  Nml Nml Nml 1- Mod Nml Nml Nml Nml Nml Nml N/A  Right Ext Indicis Nml Nml Nml Nml Nml Nml Nml Nml Nml Nml Nml Nml N/A  Right PronatorTeres Nml Nml Nml Nml Nml Nml Nml Nml Nml Nml Nml Nml N/A  Right Biceps Nml Nml Nml Nml Nml Nml Nml Nml Nml Nml Nml Nml N/A  Right Triceps Nml Nml Nml Nml Nml Nml Nml Nml Nml Nml Nml Nml N/A  Right Deltoid Nml Nml Nml Nml Nml Nml Nml Nml Nml Nml Nml Nml N/A  Right ABD Dig Min Nml Nml Nml Nml 1- Rapid Some 1+ Some 1+ Nml Nml N/A  Right FlexDigProf 4,5 Nml Nml Nml Nml 1- Rapid Some 1+ Some 1+ Nml Nml N/A      Waveforms:

## 2014-12-02 ENCOUNTER — Encounter: Payer: Self-pay | Admitting: Internal Medicine

## 2014-12-02 ENCOUNTER — Ambulatory Visit (INDEPENDENT_AMBULATORY_CARE_PROVIDER_SITE_OTHER): Payer: Medicare Other | Admitting: Neurology

## 2014-12-02 ENCOUNTER — Encounter: Payer: Self-pay | Admitting: Neurology

## 2014-12-02 ENCOUNTER — Ambulatory Visit (INDEPENDENT_AMBULATORY_CARE_PROVIDER_SITE_OTHER): Payer: Medicare Other | Admitting: Internal Medicine

## 2014-12-02 VITALS — BP 170/70 | HR 90 | Ht 68.0 in | Wt 267.4 lb

## 2014-12-02 VITALS — BP 132/72 | HR 79 | Temp 97.8°F | Resp 12 | Wt 268.0 lb

## 2014-12-02 DIAGNOSIS — G5621 Lesion of ulnar nerve, right upper limb: Secondary | ICD-10-CM | POA: Diagnosis not present

## 2014-12-02 DIAGNOSIS — E1159 Type 2 diabetes mellitus with other circulatory complications: Secondary | ICD-10-CM

## 2014-12-02 DIAGNOSIS — G250 Essential tremor: Secondary | ICD-10-CM

## 2014-12-02 DIAGNOSIS — E0842 Diabetes mellitus due to underlying condition with diabetic polyneuropathy: Secondary | ICD-10-CM

## 2014-12-02 LAB — HEMOGLOBIN A1C: Hgb A1c MFr Bld: 11.1 % — ABNORMAL HIGH (ref 4.6–6.5)

## 2014-12-02 MED ORDER — PROPRANOLOL HCL 20 MG PO TABS: 20.0000 mg | ORAL_TABLET | Freq: Two times a day (BID) | ORAL | Status: DC

## 2014-12-02 MED ORDER — INSULIN REGULAR HUMAN (CONC) 500 UNIT/ML ~~LOC~~ SOLN: 60.0000 [IU] | Freq: Three times a day (TID) | SUBCUTANEOUS | Status: DC

## 2014-12-02 MED ORDER — "INSULIN SYRINGE-NEEDLE U-100 31G X 15/64"" 0.5 ML MISC": Status: DC

## 2014-12-02 NOTE — Patient Instructions (Addendum)
Please stop Novolin and Novolin 70/30 and start U500 insulin - please inject 12 units (0.12 mL) 30 min before each meal.  Please return in 1 month with your sugar log.   Please stop at the lab.

## 2014-12-02 NOTE — Patient Instructions (Addendum)
1.  Avoid hyperflexion of the elbow 2.  Start to use a soft elbow pad to avoid compression of the nerve around the elbow 3.  Start propranolol 20mg  twice daily for your tremor 4.  Check blood work 5.  Return to clinic in 4-6 weeks

## 2014-12-02 NOTE — Progress Notes (Signed)
Follow-up Visit   Date: 12/02/2014   Edward [name] MRN: [id number] DOB: 05/08/1941   Interim History: Edward [name] is a 74 y.o. right-handed African American male with history of history of coronary artery disease, TIA (2002, manifested as right sided numbness), diabetes mellitus type 2 (HbA1c 9.1), hypertension, hyperlipidemia, GERD and BPH   returning to the clinic for follow-up of diabetic neuropathy and tremors.  The patient was accompanied to the clinic by self.  History of present illness: He moved from [place] in February 2015 to be closer to his brother. He has been diabetic since 1985 and in late 1990s, he developed numbness > tingling of the feet which seems to have been stable since onset. He currently complains of tingling and numbness below the level of the ankles, worse on the right side. He has interemittent numbness/tingling of the right hand and also complains of muscle cramps. He reports falling twice this year, once when he was climbing stairs. He ambulates independently. He saw Dr. Etter Sjogren in April who started neurontin 119m twice daily which seems to help slightly.  He has previously been a [place]ery [place] His last clinic note dated 08/14/2013, indicates that he was being seen for history of memory loss and headaches. Headaches improved significantly after he retired. He has short-term memory problems, which does not interefere with daily life. Mostly forgetting little details or tasks. He is driving and has not been involving in any accidents. He is able to do all his IADLs and ADLs. No problems with finances.  UPDATE 12/02/2014:  Patient is here for follow-up visit.  He was last seen in June and unfortunately, due to other medical problems that came up, he was unable to be seen in the clinic here until now.  He underwent right shoulder surgery in September 2015 and since then he has noticed a change in his voice.  He complains of  stiffness of his arms, especially in the morning.  He had a fall in December 2015 after slipping on some grease in the kitchen and was unable to get up himself, so called for his brother.  No further falls since then.  He complains about bilateral hand tremor and voice change which is worse when he is stressed. His handwriting has become worse and he has more difficulty with fine motor tasks.  Tremors are predominately worse when trying to do tasks, but can be present when he is resting, too.   He does not drink alcohol or caffiene.  Endorses chronic constipation.  No change in taste/small, vivid dreams, or slowed movements.   His two brothers have tremors. No family history of parkinson's disease.  Paresthesias are not very bothersome.     Medications:  Current Outpatient Prescriptions on File Prior to Visit  Medication Sig Dispense Refill  . amLODipine (NORVASC) 10 MG tablet Take 1 tablet (10 mg total) by mouth daily. 30 tablet 6  . aspirin EC 81 MG tablet Take 81 mg by mouth daily.    . Blood Glucose Monitoring Suppl (ONE TOUCH ULTRA SYSTEM KIT) W/DEVICE KIT Use to test blood sugar daily as instructed. Dx code: 250.00 1 each 0  . Ciclopirox 1 % shampoo Massage in scalp 3x a week and leave on for 10 min and rinse 120 mL 0  . clonazePAM (KLONOPIN) 1 MG tablet Take 1 tablet (1 mg total) by mouth at bedtime as needed for anxiety. 30 tablet 0  . cyclobenzaprine (FLEXERIL) 5  MG tablet Take 1 tablet (5 mg total) by mouth 3 (three) times daily as needed for muscle spasms. 30 tablet 0  . glucose blood (BL TEST STRIP PACK) test strip Use to test blood sugar 4 times daily as instructed. Dx code: 250.00 200 each 4  . HYDROcodone-acetaminophen (NORCO) 5-325 MG per tablet Take 1 tablet by mouth every 6 (six) hours as needed. 60 tablet 0  . Insulin Syringes, Disposable, U-100 1 ML MISC 60 Units by Does not apply route 4 (four) times daily. 120 each 2  . Lancets (ONETOUCH ULTRASOFT) lancets Use to test blood  sugar 4 times daily as instructed. Dx code: 250.00 200 each 4  . lisinopril (PRINIVIL,ZESTRIL) 20 MG tablet Take 1 tablet (20 mg total) by mouth daily. 30 tablet 6  . nitroGLYCERIN (NITROSTAT) 0.4 MG SL tablet Place 1 tablet (0.4 mg total) under the tongue every 5 (five) minutes as needed for chest pain (CP or SOB). 25 tablet 4  . oxyCODONE-acetaminophen (ROXICET) 5-325 MG per tablet Take 1 tablet by mouth every 4 (four) hours as needed for severe pain. 60 tablet 0  . pantoprazole (PROTONIX) 40 MG tablet Take 1 tablet (40 mg total) by mouth daily at 6 (six) AM. 30 tablet 2  . pravastatin (PRAVACHOL) 20 MG tablet Take 1 tablet (20 mg total) by mouth daily. 30 tablet 2  . insulin regular human CONCENTRATED (HUMULIN R) 500 UNIT/ML SOLN injection Inject 0.12 mLs (60 Units total) into the skin 3 (three) times daily with meals. 20 mL 2  . Insulin Syringe-Needle U-100 (BD INSULIN SYRINGE ULTRAFINE) 31G X 15/64" 0.5 ML MISC Use 3x a day 100 each 2  . metoprolol (LOPRESSOR) 50 MG tablet Take 1 tablet (50 mg total) by mouth 2 (two) times daily. 60 tablet 6   No current facility-administered medications on file prior to visit.    Allergies: No Known Allergies  Review of Systems:  CONSTITUTIONAL: No fevers, chills, night sweats, or weight loss.  EYES: No visual changes or eye pain ENT: No hearing changes.  No history of nose bleeds.   RESPIRATORY: No cough, wheezing and shortness of breath.   CARDIOVASCULAR: Negative for chest pain, and palpitations.   GI: Negative for abdominal discomfort, blood in stools or black stools.  No recent change in bowel habits.   GU:  No history of incontinence.   MUSCLOSKELETAL: +history of joint pain or swelling.  No myalgias.   SKIN: Negative for lesions, rash, and itching.   ENDOCRINE: Negative for cold or heat intolerance, polydipsia or goiter.   PSYCH:  no depression or anxiety symptoms.   NEURO: As Above.   Vital Signs:  BP 170/70 mmHg  Pulse 90  Ht _0   (1.727 m)  Wt 267 lb 7 oz (121.309 kg)  BMI 40.67 kg/m2  SpO2 99%  Neurological Exam: MENTAL STATUS including orientation to time, place, person, recent and remote memory, attention span and concentration, language, and fund of knowledge is normal.  Tremulous speech pattern, he is able to enunciate lingual and gutteral sounds well.  There is no dysarthria.  Tremulous with hand writing.    CRANIAL NERVES:  No visual field defects. Pupils equal round and reactive to light.  Normal conjugate, extra-ocular eye movements in all directions of gaze.  No ptosis. Normal facial sensation.  Face is symmetric. Palate elevates symmetrically.  Tongue is midline.  Bilateral palmomental reflex present.  Snout, Jaw jerk, and Myerson's sign is negative.  MOTOR:  Motor strength is  5/5 in all extremities.  Intention and action tremor present bilaterally, worse on the right hand.  No rest tremor.  No pronator drift.  Tone is normal.    MSRs:  Right                                                                 Left brachioradialis 2+  brachioradialis 2+  biceps 1+  biceps 1+  triceps 1+  triceps 1+  patellar 0  Patellar 0  ankle jerk 0  ankle jerk 0   SENSORY:  Vibration reduced distal to knees bilaterally. Romberg's sign absent.   COORDINATION/GAIT:  Normal finger-to- nose-finger and heel-to-shin.  Amplitude of finger tapping is reduced bilaterally.  Heel tapping slightly slowed bilaterally.  Gait narrow based and stable, turns with 3 steps.   Data: EMG 12/01/2014: 1. 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 Overall, these findings are moderately severe in degree electrically.  2. 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 There is no evidence of a cervical/lumbosacral radiculopathy affecting the right side  Lab Results    Component Value Date   HGBA1C 11.1* 12/02/2014   Labs 04/08/2014:  Copper 64*, ceruloplasmin 18, vitamin B12 290, SPEP/UPEP with IFE no M protein  CT head 10/21/2014:  Negative   IMPRESSION/PLAN: 1.  Diabetic neuropathy affecting the hand and feet EMG with moderately severe neuropathy affecting the right side Paresthesias not bothersome enough to start medications Etiology is most likely diabetes since it is poorly controlled, however copper levels were reduced previously which may be contributing Check TSH, vitamin B12, copper, ceruloplasmin Encouraged tight glycemic control  2.  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  Exam with mild bradykinesia only Trial of propranolol 99m BID  3.  Right ulnar neuropathy at the elbow Encouraged to use soft elbow pad and avoid hyperflexion at the elbow.  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  Return to clinic in 4-6 weeks  The duration of this appointment visit was 40 minutes of face-to-face time with the patient.  Greater than 50% of this time was spent in counseling, explanation of diagnosis, planning of further management, and coordination of care.   Thank you for allowing me to participate in patient's care.  If I can answer any additional questions, I would be pleased to do so.    Sincerely,    Azhia Siefken K. PPosey Pronto DO

## 2014-12-02 NOTE — Progress Notes (Signed)
Patient ID: Edward [name], male   DOB: 08-30-1941, 74 y.o.   MRN: [id number]  HPI: Edward [name] is a 74 y.o.-year-old male, returning for f/u for DM2,  dx 1987, insulin-dependent since 1989, uncontrolled, with complications (CAD, PN). He moved from [place] in 12/2013. Last visit 1.5 mo ago.  He had surgery for his R shoulder in 07/30/2014.   Last hemoglobin A1c was: Lab Results  Component Value Date   HGBA1C 10.2* 05/27/2014   HGBA1C 9.1* 01/27/2014   He was on: - Levemir 75 >> 90 units at bedtime. - NovoLog: - 30 >> 40 units with breakfast - 20 >> 30  units with lunch - 30 >> 40 units with dinner But he felt that this regimen was not working. He was on Metformin >> N/V/D.  He asked me whether it is possible to switch back to 70/30 insulin.  He is now on:  Insulin  Before breakfast  Before lunch  Before dinner   Novolin 70/30  40  20  40   Novolin 15  15  15     Pt checks his sugars 4-5 a day and they are widely fluctuating - no log - reviewed the sugars from before - am: 180-270 >> 100-331 (436) >> 128-344 >> 52, 60, 79-339  - 2h after b'fast: n/c >> 200 >> 78 x1, 209-337 >> 128-263, 380 - before lunch: 280-310 >> 216, 294, 402 >> 179-370 >> 68, 178-299, 319 - 2h after lunch: n/c >> 222, 375, 545 >> 198-309 >> 121-254 - before dinner: 280s >> 177 >> 67 x1, 175-312 >> 302, 395 - 2h after dinner: n/c >> 301-379>> 99, 128, 368 - bedtime: 200-300 >> 190-463 >> 46, 284 - nighttime: n/c >> 300s >> 139-339 Lowest sugar was 100 - he has hypoglycemia awareness at 90  Highest sugar was 500s.  Has a One Touch Ultra meter.   Pt's meals are: - Breakfast: eggs, oatmeal, grits, bacon - Lunch: baked chiken + vegetables - Dinner: same as lunch - Snacks: sweets - cookies Rarely fast food.  - mild CKD, last BUN/creatinine:  Lab Results  Component Value Date   BUN 13 07/29/2014   CREATININE 1.01 07/29/2014  On Lisinopril. ACR 37.6 in 02/11/2014) - Has HL. Last set of lipids: Lab  Results  Component Value Date   CHOL 170 11/20/2014   HDL 37.10* 11/20/2014   LDLCALC 107* 11/20/2014   TRIG 130.0 11/20/2014   CHOLHDL 5 11/20/2014  Not on a statin. He gets leg cramps from Lipitor >> cannot tolerate it. - last eye exam was in 10/2014 (Dr Zigmund Daniel). Has DR. Had cataracts sx.  - + numbness and tingling in his feet. He saw podiatrist (02/2014) >> has PN. On ASA 81.  I reviewed pt's medications, allergies, PMH, social hx, family hx, and changes were documented in the history of present illness. Otherwise, unchanged from my initial visit note.  ROS: Constitutional: + weight loss, + fatigue, + hot flushes, + poor sleep, + nocturia, + excessive urination Eyes: + blurry vision, no xerophthalmia ENT: no sore throat, no nodules palpated in throat, no dysphagia/odynophagia, no hoarseness Cardiovascular: no CP/+ SOB/+ palpitations/no leg swelling Respiratory: no cough/+ SOB Gastrointestinal: no N/V/D/C/ heartburn Musculoskeletal: + muscle aches/+ joint aches (shoulders)  Skin: + rash Neurological: + tremors/no numbness/tingling/dizziness + low libido  PE: BP 132/72 mmHg  Pulse 79  Temp(Src) 97.8 F (36.6 C) (Oral)  Resp 12  Wt 268 lb (121.564 kg)  SpO2 95% Wt Readings from  Last 3 Encounters:  12/02/14 268 lb (121.564 kg)  12/02/14 267 lb 7 oz (121.309 kg)  11/20/14 269 lb 12.8 oz (122.38 kg)   Constitutional: obese, in NAD Eyes: PERRLA, EOMI, no exophthalmos ENT: moist mucous membranes, no thyromegaly, no cervical lymphadenopathy Cardiovascular: RRR, No MRG Respiratory: CTA B Gastrointestinal: abdomen soft, NT, ND, BS+ Musculoskeletal: no deformities, strength intact in all 4 Skin: moist, warm, no rashes Neurological: no tremor with outstretched hands, DTR normal in all 4  ASSESSMENT: 1. DM2, ninsulin-dependent, uncontrolled, with complications - CAD H/o steroid inj's He has been on U500 (he has been on this in the past and thinks he developed PNA form  it!)  PLAN:  1. Patient with long-standing, uncontrolled diabetes, previously on premixed + rapid acting insulin regimen. We discussed about U500 at previous visit and we will likely need to go back to this - he ie ready for this now, especially since sugars are very high. No lows <100. - I suggested to:  Patient Instructions  Please stop Novolin and Novolin 70/30 and start U500 insulin - please inject 12 units (0.12 mL) 30 min before each meal.  Please return in 1 month with your sugar log.   Please stop at the lab.  - advised how to draw U500 in the syringe - continue checking sugars at different times of the day - check 3-4 times a day, rotating checks - given more sugar logs - up to date with yearly eye exams  - check HbA1c today - Return to clinic in 1 mo with sugar log   Office Visit on 12/02/2014  Component Date Value Ref Range Status  . Hgb A1c MFr Bld 12/02/2014 11.1* 4.6 - 6.5 % Final   Glycemic Control Guidelines for People with Diabetes:Non Diabetic:  <6%Goal of Therapy: <7%Additional Action Suggested:  >8%    HbA1c higher >> see plan above.

## 2014-12-03 LAB — VITAMIN B12: Vitamin B-12: 345 pg/mL (ref 211–911)

## 2014-12-03 LAB — TSH: TSH: 1.041 u[IU]/mL (ref 0.350–4.500)

## 2014-12-04 LAB — CERULOPLASMIN: CERULOPLASMIN: 21 mg/dL (ref 18–36)

## 2014-12-05 LAB — COPPER, SERUM: Copper: 74 ug/dL (ref 70–175)

## 2014-12-09 DIAGNOSIS — L219 Seborrheic dermatitis, unspecified: Secondary | ICD-10-CM | POA: Diagnosis not present

## 2014-12-09 DIAGNOSIS — L821 Other seborrheic keratosis: Secondary | ICD-10-CM | POA: Diagnosis not present

## 2014-12-12 DIAGNOSIS — M7501 Adhesive capsulitis of right shoulder: Secondary | ICD-10-CM | POA: Diagnosis not present

## 2014-12-22 ENCOUNTER — Other Ambulatory Visit: Payer: Self-pay | Admitting: *Deleted

## 2014-12-22 MED ORDER — "INSULIN SYRINGE-NEEDLE U-100 31G X 15/64"" 0.5 ML MISC": Status: DC

## 2014-12-22 NOTE — Telephone Encounter (Signed)
Pharmacy did not receive the rx for syringes. Resending to pharmacy.

## 2014-12-24 ENCOUNTER — Telehealth: Payer: Self-pay | Admitting: Internal Medicine

## 2014-12-24 NOTE — Telephone Encounter (Signed)
u 500 is going to be expensive can we put him back on the novolin r per pharmacist request

## 2014-12-25 NOTE — Telephone Encounter (Signed)
We did the PA. I believe it was approved. I believe the cost is still too high. I will check on a coupon for the U500 and get back with pt. Be advised.

## 2014-12-25 NOTE — Telephone Encounter (Signed)
No, this is not preferable ashis DM is very poorly controlled on the R insulin. Can he use a coupon? Or can we do a PA?

## 2014-12-25 NOTE — Telephone Encounter (Signed)
Please read note below and advise.  

## 2014-12-28 ENCOUNTER — Other Ambulatory Visit: Payer: Self-pay | Admitting: Internal Medicine

## 2014-12-28 ENCOUNTER — Encounter: Payer: Self-pay | Admitting: Family Medicine

## 2014-12-28 DIAGNOSIS — K2100 Gastro-esophageal reflux disease with esophagitis, without bleeding: Secondary | ICD-10-CM

## 2014-12-28 IMAGING — CR DG CHEST 2V
2 series · 2 of 2 positions shown · non-contrast
Comparison: April 16, 2014

CLINICAL DATA: Pneumonia follow-up.  Shortness of breath.

EXAM:
CHEST  2 VIEW

[view not recorded (1 of 2)]
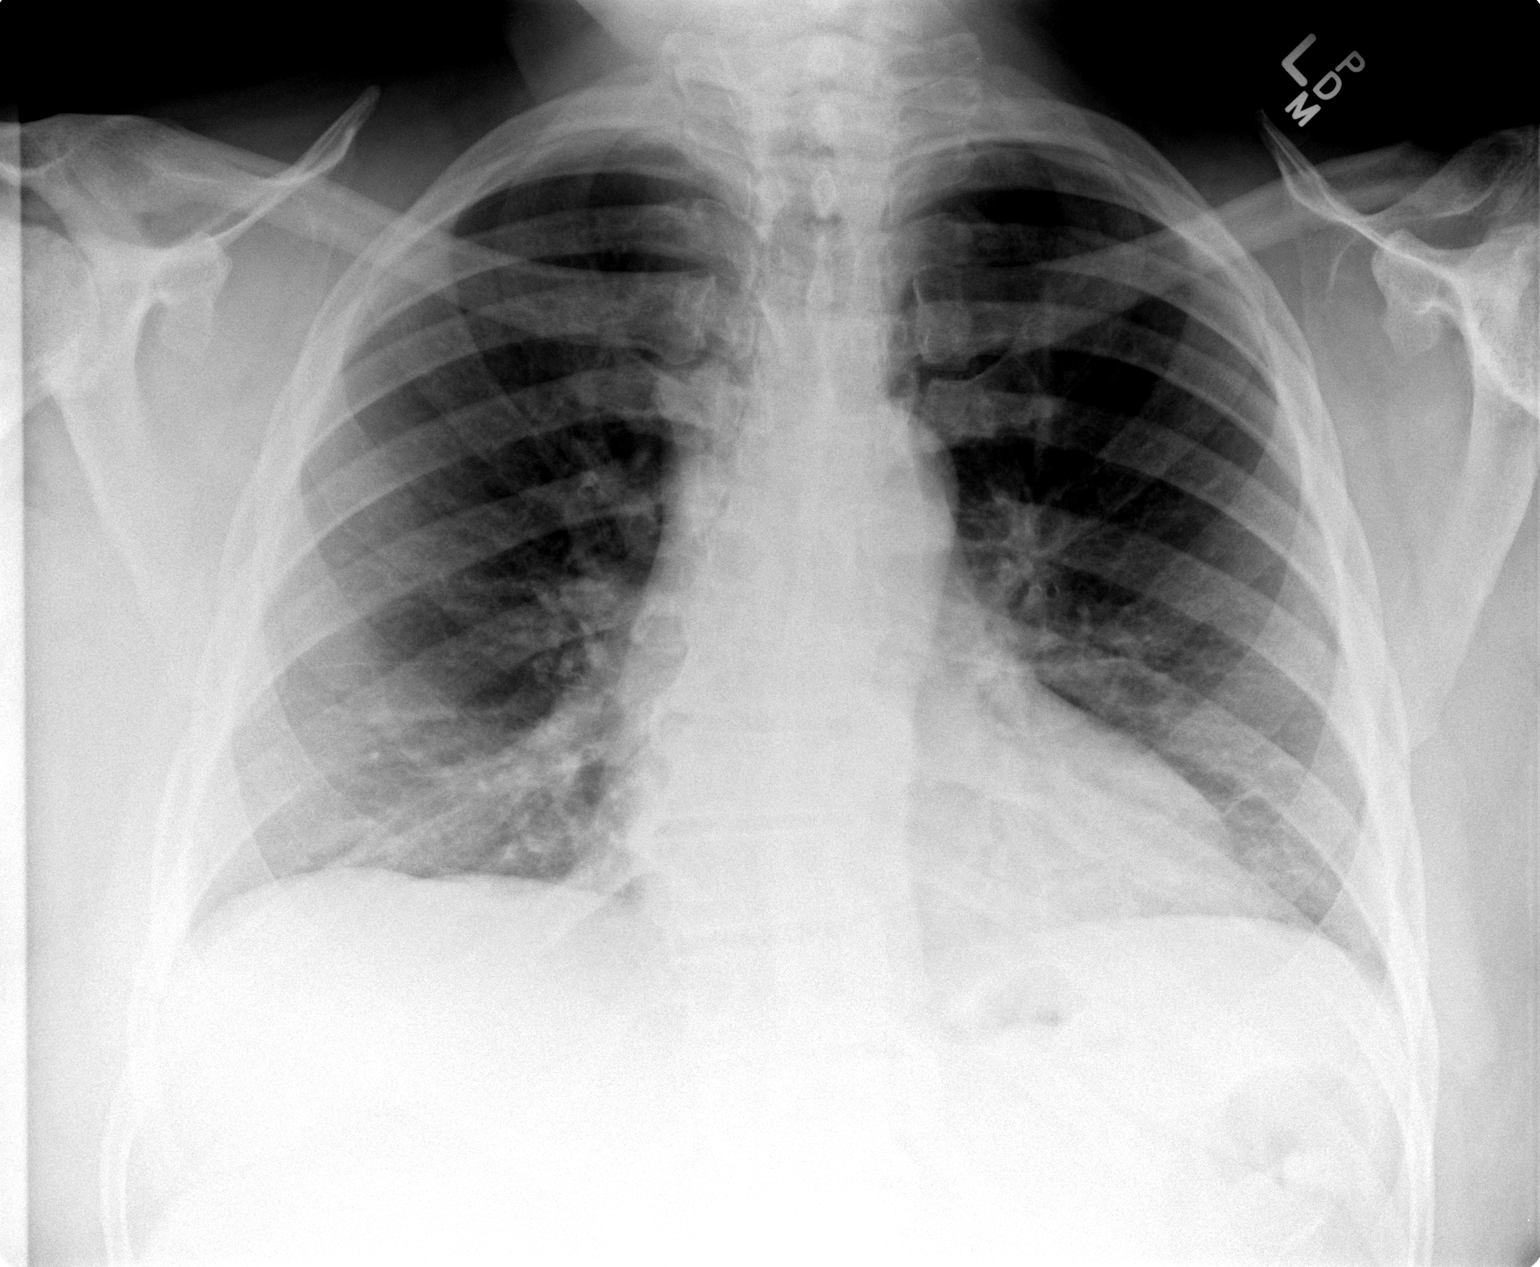

[view not recorded (2 of 2)]
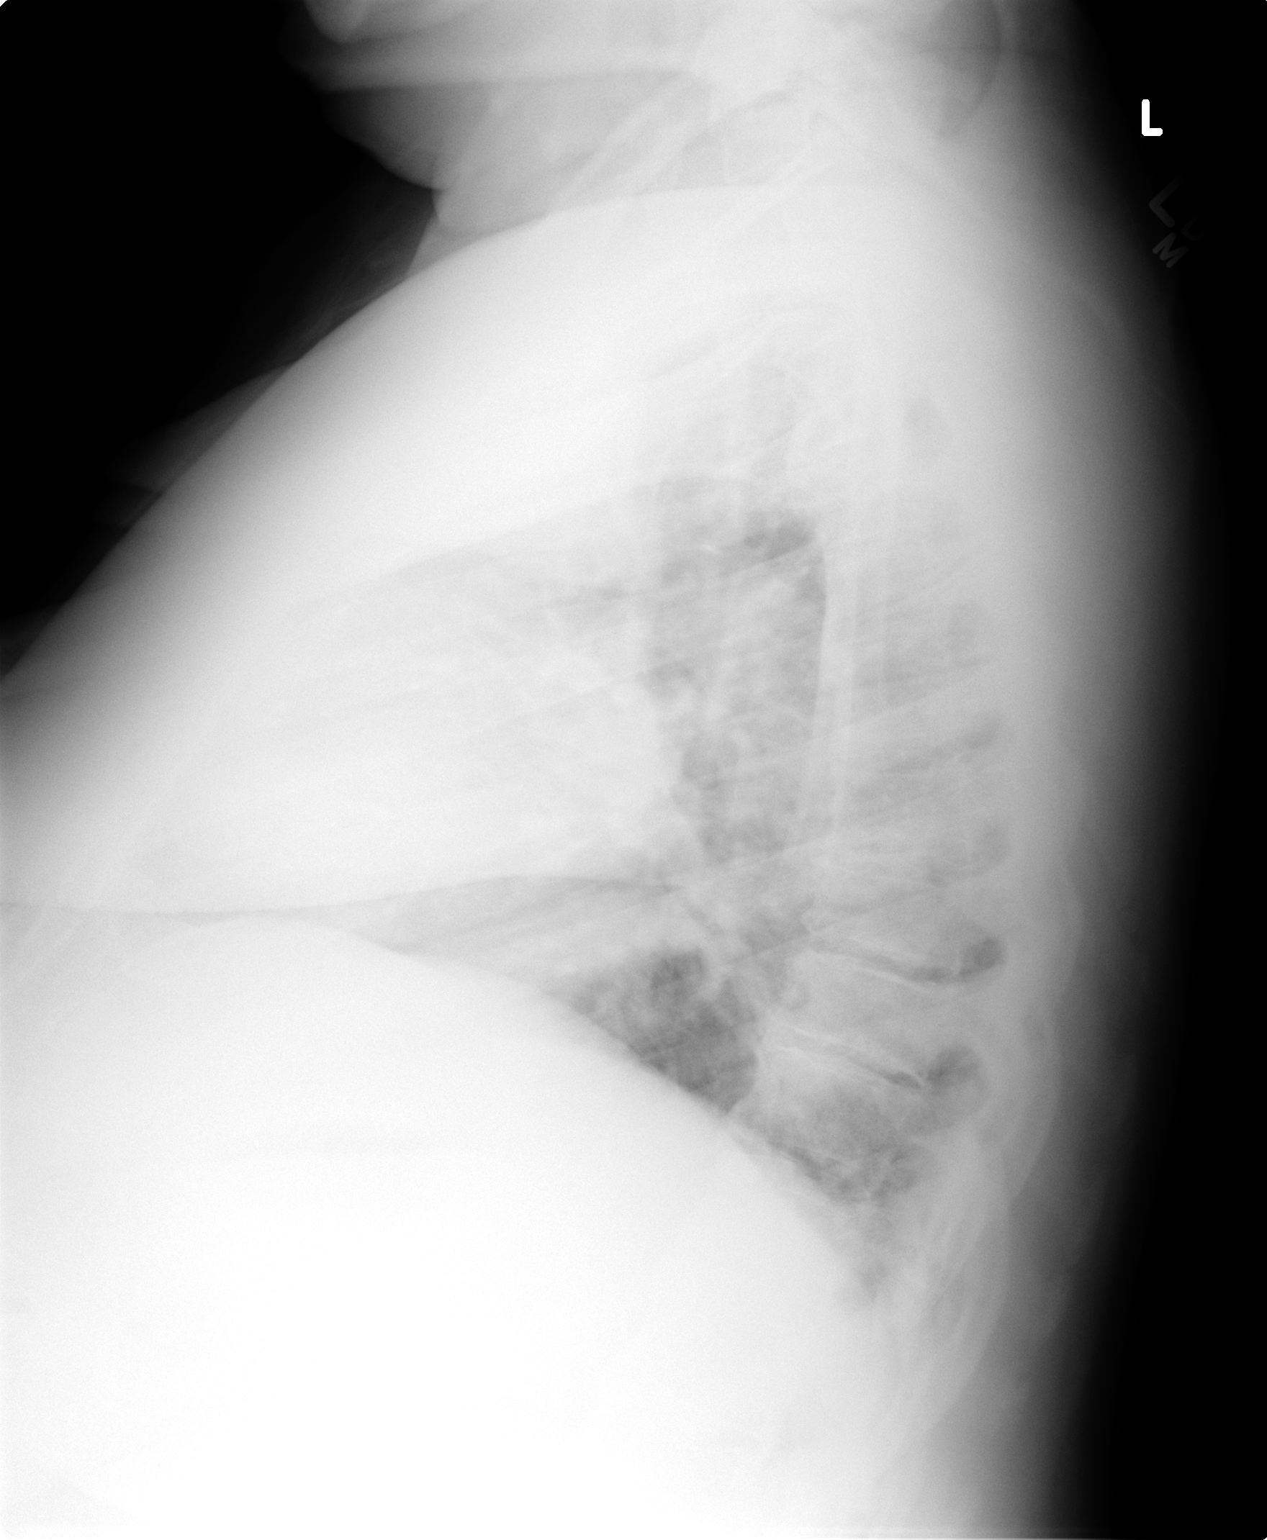

[2 of 2 positions shown; findings below may reference images not displayed]

FINDINGS: The heart size and mediastinal contours are within normal limits.
There is no focal infiltrate, pulmonary edema, or pleural effusion.
There is scoliosis of spine. The visualized skeletal structures are
otherwise unremarkable.
IMPRESSION: No active cardiopulmonary disease.

## 2014-12-29 ENCOUNTER — Encounter: Payer: Self-pay | Admitting: Family Medicine

## 2014-12-29 ENCOUNTER — Other Ambulatory Visit: Payer: Self-pay | Admitting: *Deleted

## 2014-12-29 MED ORDER — PANTOPRAZOLE SODIUM 40 MG PO TBEC: 40.0000 mg | DELAYED_RELEASE_TABLET | Freq: Every day | ORAL | Status: DC

## 2014-12-29 MED ORDER — AMLODIPINE BESYLATE 10 MG PO TABS: 10.0000 mg | ORAL_TABLET | Freq: Every day | ORAL | Status: DC

## 2014-12-30 ENCOUNTER — Other Ambulatory Visit: Payer: Self-pay | Admitting: *Deleted

## 2014-12-30 MED ORDER — ONETOUCH ULTRA SYSTEM W/DEVICE KIT: PACK | Status: DC

## 2014-12-30 NOTE — Telephone Encounter (Signed)
Rx request for One Touch Ultra Test Meter through MyChart.

## 2015-01-02 ENCOUNTER — Ambulatory Visit (INDEPENDENT_AMBULATORY_CARE_PROVIDER_SITE_OTHER): Payer: Medicare Other | Admitting: Interventional Cardiology

## 2015-01-02 ENCOUNTER — Encounter: Payer: Self-pay | Admitting: Internal Medicine

## 2015-01-02 ENCOUNTER — Encounter: Payer: Self-pay | Admitting: Interventional Cardiology

## 2015-01-02 ENCOUNTER — Telehealth: Payer: Self-pay | Admitting: Internal Medicine

## 2015-01-02 ENCOUNTER — Ambulatory Visit (INDEPENDENT_AMBULATORY_CARE_PROVIDER_SITE_OTHER): Payer: Medicare Other | Admitting: Internal Medicine

## 2015-01-02 VITALS — BP 118/70 | HR 83 | Temp 97.7°F | Resp 12 | Wt 269.0 lb

## 2015-01-02 DIAGNOSIS — E1159 Type 2 diabetes mellitus with other circulatory complications: Secondary | ICD-10-CM | POA: Diagnosis not present

## 2015-01-02 DIAGNOSIS — I10 Essential (primary) hypertension: Secondary | ICD-10-CM

## 2015-01-02 DIAGNOSIS — E785 Hyperlipidemia, unspecified: Secondary | ICD-10-CM | POA: Diagnosis not present

## 2015-01-02 DIAGNOSIS — I209 Angina pectoris, unspecified: Secondary | ICD-10-CM | POA: Diagnosis not present

## 2015-01-02 DIAGNOSIS — R079 Chest pain, unspecified: Secondary | ICD-10-CM | POA: Insufficient documentation

## 2015-01-02 DIAGNOSIS — I25118 Atherosclerotic heart disease of native coronary artery with other forms of angina pectoris: Secondary | ICD-10-CM | POA: Diagnosis not present

## 2015-01-02 MED ORDER — INSULIN NPH ISOPHANE & REGULAR (70-30) 100 UNIT/ML ~~LOC~~ SUSP: SUBCUTANEOUS | Status: DC

## 2015-01-02 MED ORDER — INSULIN REGULAR HUMAN (CONC) 500 UNIT/ML ~~LOC~~ SOLN: 60.0000 [IU] | Freq: Three times a day (TID) | SUBCUTANEOUS | Status: DC

## 2015-01-02 MED ORDER — INSULIN REGULAR HUMAN 100 UNIT/ML IJ SOLN: INTRAMUSCULAR | Status: DC

## 2015-01-02 NOTE — Patient Instructions (Signed)
Your physician recommends that you continue on your current medications as directed. Please refer to the Current Medication list given to you today.  Your physician has requested that you have a lexiscan myoview. For further information please visit [email or web address]. Please follow instruction sheet, as given.   Your physician wants you to follow-up in: 1 year with Dr.Smith You will receive a reminder letter in the mail two months in advance. If you don't receive a letter, please call our office to schedule the follow-up appointment.  

## 2015-01-02 NOTE — Telephone Encounter (Signed)
Done

## 2015-01-02 NOTE — Telephone Encounter (Signed)
Patient is at [facility] on [address] he stated that [facility] sent over Humulin 70/30 he wanted Humulin U 500

## 2015-01-02 NOTE — Patient Instructions (Signed)
Patient Instructions  Please continue the current regimen:  Insulin  Before breakfast  Before lunch  Before dinner  Bedtime  Novolin 70/30  40  40 40  If sugars >200;  take 20 units  Novolin 30 30 30     When you get the U500, please stop Novolin and Novolin 70/30 and start U500 insulin - please inject 12 units (0.12 mL) 30 min before each meal. If sugars still high after you start this, can increase to 15 units before each meals.  Please return in 1 month after you start U500 insulin with your sugar log.   Please stop at the lab.

## 2015-01-02 NOTE — Progress Notes (Signed)
Patient ID: Edward [name], male   DOB: [birth year], 74 y.o.   MRN: [id number]  HPI: Edward [name] is a 74 y.o.-year-old male, returning for f/u for DM2,  dx 1987, insulin-dependent since 1989, uncontrolled, with complications (CAD, PN). [place] moved [place] in 12/2013. Last visit 1 mo ago.  Last hemoglobin A1c was: Lab Results  Component Value Date   HGBA1C 11.1* 12/02/2014   HGBA1C 10.2* 05/27/2014   HGBA1C 9.1* 01/27/2014   He was on: - Levemir 75 >> 90 units at bedtime. - NovoLog: - 30 >> 40 units with breakfast - 20 >> 30  units with lunch - 30 >> 40 units with dinner But he felt that this regimen was not working. He was on Metformin >> N/V/D.  We then switched to:  Insulin  Before breakfast  Before lunch  Before dinner  Bedtime  Novolin 70/30  40  20 >> 40 40  If sugars >200; he takes 20 units  Novolin 15 >> 30 15 >> 30 15 >> 30    However, sugars were still very uncontrolled >> switched to U500 insulin - could not get it as it was 400$: - 12 units before each meal  Pt checks his sugars 4-5 a day and they are better after increasing insulin. He brings a great CBG log - reviewed the sugars - but did not write them down after he increased his doses - per his recall, after increasing the doses: - am: 180-270 >> 100-331 (436) >> 128-344 >> 52, 60, 79-339 >> 90-120s - 2h after b'fast: n/c >> 200 >> 78 x1, 209-337 >> 128-263, 380 >> 170-180 - before lunch: 280-310 >> 216, 294, 402 >> 179-370 >> 68, 178-299, 319  >> 130s - 2h after lunch: n/c >> 222, 375, 545 >> 198-309 >> 121-254 >> 170-180s - before dinner: 280s >> 177 >> 67 x1, 175-312 >> 302, 395 >> 170s - 2h after dinner: n/c >> 301-379>> 99, 128, 368 >> n/c - bedtime: 200-300 >> 190-463 >> 46, 284 >> <200 - nighttime: n/c >> 300s >> 139-339 Lowest sugar was 100 - he has hypoglycemia awareness at 90  Highest sugar was 500s.  Has a One Touch Ultra meter.   Pt's meals are: - Breakfast: eggs, oatmeal, grits, bacon - Lunch:  baked chiken + vegetables - Dinner: same as lunch - Snacks: sweets - cookies Rarely fast food. He gave up sodas. He still drink fruit juice.  - mild CKD, last BUN/creatinine:  Lab Results  Component Value Date   BUN 13 07/29/2014   CREATININE 1.01 07/29/2014  On Lisinopril. ACR 37.6 in 02/11/2014) - Has HL. Last set of lipids: Lab Results  Component Value Date   CHOL 170 11/20/2014   HDL 37.10* 11/20/2014   LDLCALC 107* 11/20/2014   TRIG 130.0 11/20/2014   CHOLHDL 5 11/20/2014  Not on a statin. He gets leg cramps from Lipitor >> cannot tolerate it. - last eye exam was in 10/2014 (Dr Zigmund Daniel). Has DR. Had cataracts sx.  - + numbness and tingling in his feet. He saw podiatrist (02/2014) >> has PN. On ASA 81.  I reviewed pt's medications, allergies, PMH, social hx, family hx, and changes were documented in the history of present illness. Otherwise, unchanged from my initial visit note.  He had surgery for his R shoulder in 07/30/2014.   ROS: Constitutional: + weight gain, + fatigue, + hot flushes, + poor sleep Eyes: + blurry vision, no xerophthalmia ENT: no sore throat,  no nodules palpated in throat, no dysphagia/odynophagia, no hoarseness Cardiovascular: + CP/+ SOB/no palpitations/no leg swelling Respiratory: no cough/+ SOB/+ wheezing Gastrointestinal: no N/V/D/C/ heartburn Musculoskeletal: + muscle aches/+ joint aches (shoulders)  Skin: + rash Neurological: no tremors/no numbness/tingling/dizziness  PE: BP 118/70 mmHg  Pulse 83  Temp(Src) 97.7 F (36.5 C) (Oral)  Resp 12  Wt 269 lb (122.018 kg)  SpO2 97% Wt Readings from Last 3 Encounters:  01/02/15 269 lb (122.018 kg)  01/02/15 269 lb 12.8 oz (122.38 kg)  12/02/14 268 lb (121.564 kg)   Constitutional: obese, in NAD Eyes: PERRLA, EOMI, no exophthalmos ENT: moist mucous membranes, no thyromegaly, no cervical lymphadenopathy Cardiovascular: RRR, No MRG, + B Ankle swelling Respiratory: CTA B Gastrointestinal:  abdomen soft, NT, ND, BS+ Musculoskeletal: no deformities, strength intact in all 4 Skin: moist, warm, no rashes Neurological: no tremor with outstretched hands, DTR normal in all 4  ASSESSMENT: 1. DM2, ninsulin-dependent, uncontrolled, with complications - CAD H/o steroid inj's He has been on U500 (he has been on this in the past and thinks he developed PNA form it!)  PLAN:  1. Patient with long-standing, uncontrolled diabetes, on premixed + fast acting insulin regimen. At last visit, we tried to start U500 insulin, but he could not get it because this was too expensive. Today we gave him a coupon for 1 month free of this concentrated insulin, and also we gave him the application for [employer] assistance program. Since he could not get the U500 insulin, he increased his U100 insulins with great results: sugars much improved, and no lows. He is also more active and is watching his diet. I advised him to stop his fruit juices.  - I suggested to continue his current regimen until he can get U500 insulin, and then start as below:  Patient Instructions  Please continue the current regimen:  Insulin  Before breakfast  Before lunch  Before dinner  Bedtime  Novolin 70/30  40  40 40  If sugars >200;  take 20 units  Novolin 30 30 30     When you get the U500, please stop Novolin and Novolin 70/30 and start U500 insulin - please inject 12 units (0.12 mL) 30 min before each meal. If sugars still high after you start this, can increase to 15 units before each meals.  Please return in 1 month after you start U500 insulin with your sugar log.   - I refilled his Novolin insulins for now - continue checking sugars at different times of the day - check 3-4 times a day, rotating checks - up to date with yearly eye exams  - Return to clinic in 1 mo with sugar log

## 2015-01-02 NOTE — Progress Notes (Signed)
Cardiology Office Note   Date:  01/02/2015   ID:  Edward [name], DOB [birth year], MRN [id number]  PCP:  Garnet Koyanagi, DO  Cardiologist:   Sinclair Grooms, MD   No chief complaint on file.     History of Present Illness: Edward [name] is a 74 y.o. male who presents for  pain in chest. Precipitated by activity. Also occurs when he lies down. In further questioning, there are 2 types of discomfort. The discomfort that occurs with activity as a pressure-like sensation. The discomfort when he lies down he is a palpitation. There is also mild orthopnea. He has had no prolonged episodes of pain. Prior catheterization at [facility] demonstrated noncritical LAD disease in 2013.    Past Medical History  Diagnosis Date  . Hypertension   . Arthritis   . CAD (coronary artery disease)     a. Pt reports "small heart attack" in 2013 at [facility], denies need for stent or CABG, does not know findings of cath.  Marland Kitchen TIA (transient ischemic attack) 2002  . Dyslipidemia, goal LDL below 70 01/29/2014  . Heart attack 02/03/2014    "mild heart attack"  . GERD (gastroesophageal reflux disease)   . Enlarged prostate   . Shortness of breath     exertion  . Sleep apnea     does not use cpap  . Pneumonia 01/2013  . Pneumonia 03/2014    hospitalized  . DM type 2 (diabetes mellitus, type 2), insulin dependent 01/29/2014    fasting cbg 50-120 with new regimen  . Anxiety   . [id number](863.8)     Past Surgical History  Procedure Laterality Date  . Total knee arthroplasty Bilateral   . Cardiac catheterization  01/28/14    + CAD treat medically  . Lumbar disc surgery    . Cataract extraction Bilateral   . Joint replacement Bilateral     knee   . Back surgery    . Eye surgery Bilateral   . Shoulder arthroscopy Right 07/30/2014    Procedure: Right Shoulder Arthroscopy, Debridement, Decompression, Manipulation Under Anesthesia;  Surgeon: Newt Minion, MD;  Location: [facility];   Service: Orthopedics;  Laterality: Right;  . Left heart catheterization with coronary angiogram N/A 01/28/2014    Procedure: LEFT HEART CATHETERIZATION WITH CORONARY ANGIOGRAM;  Surgeon: Sinclair Grooms, MD;  Location: [facility] CATH LAB;  Service: Cardiovascular;  Laterality: N/A;     Current Outpatient Prescriptions  Medication Sig Dispense Refill  . amLODipine (NORVASC) 10 MG tablet Take 1 tablet (10 mg total) by mouth daily. 30 tablet 11  . aspirin EC 81 MG tablet Take 81 mg by mouth daily.    . Blood Glucose Monitoring Suppl (ONE TOUCH ULTRA SYSTEM KIT) W/DEVICE KIT Use to test blood sugar daily as instructed. Dx code: E11.59 1 each 0  . Ciclopirox 1 % shampoo Massage in scalp 3x a week and leave on for 10 min and rinse 120 mL 0  . clonazePAM (KLONOPIN) 1 MG tablet Take 1 tablet (1 mg total) by mouth at bedtime as needed for anxiety. 30 tablet 0  . cyclobenzaprine (FLEXERIL) 5 MG tablet Take 1 tablet (5 mg total) by mouth 3 (three) times daily as needed for muscle spasms. 30 tablet 0  . glucose blood (BL TEST STRIP PACK) test strip Use to test blood sugar 4 times daily as instructed. Dx code: 250.00 200 each 4  . HYDROcodone-acetaminophen (NORCO) 5-325 MG per tablet  Take 1 tablet by mouth every 6 (six) hours as needed. 60 tablet 0  . insulin regular human CONCENTRATED (HUMULIN R) 500 UNIT/ML SOLN injection Inject 0.12 mLs (60 Units total) into the skin 3 (three) times daily with meals. 20 mL 2  . Insulin Syringe-Needle U-100 (BD INSULIN SYRINGE ULTRAFINE) 31G X 15/64" 0.5 ML MISC Use 3x a day 100 each 3  . Insulin Syringes, Disposable, U-100 1 ML MISC 60 Units by Does not apply route 4 (four) times daily. 120 each 2  . Lancets (ONETOUCH ULTRASOFT) lancets Use to test blood sugar 4 times daily as instructed. Dx code: 250.00 200 each 4  . lisinopril (PRINIVIL,ZESTRIL) 20 MG tablet Take 1 tablet (20 mg total) by mouth daily. 30 tablet 6  . metoprolol (LOPRESSOR) 50 MG tablet Take 1 tablet (50 mg  total) by mouth 2 (two) times daily. 60 tablet 6  . nitroGLYCERIN (NITROSTAT) 0.4 MG SL tablet Place 1 tablet (0.4 mg total) under the tongue every 5 (five) minutes as needed for chest pain (CP or SOB). 25 tablet 4  . oxyCODONE-acetaminophen (ROXICET) 5-325 MG per tablet Take 1 tablet by mouth every 4 (four) hours as needed for severe pain. 60 tablet 0  . pantoprazole (PROTONIX) 40 MG tablet Take 1 tablet (40 mg total) by mouth daily at 6 (six) AM. 30 tablet 11  . pravastatin (PRAVACHOL) 20 MG tablet Take 1 tablet (20 mg total) by mouth daily. 30 tablet 2  . propranolol (INDERAL) 20 MG tablet Take 1 tablet (20 mg total) by mouth 2 (two) times daily. 60 tablet 5   No current facility-administered medications for this visit.    Allergies:   Review of patient's allergies indicates no known allergies.    Social History:  The patient  reports that he has never smoked. He has never used smokeless tobacco. He reports that he does not drink alcohol or use illicit drugs.   Family History:  The patient's family history includes Alzheimer's disease in his sister; CAD in his brother and sister; Diabetes in his other; Prostate cancer in his brother.    ROS:  Please see the history of present illness.   Otherwise, review of systems are positive for significant abnormality in glucose control.   All other systems are reviewed and negative.    PHYSICAL EXAM: VS:  BP 148/70 mmHg  Pulse 91  Ht '5\' 8"'  (1.727 m)  Wt 269 lb 12.8 oz (122.38 kg)  BMI 41.03 kg/m2 , BMI Body mass index is 41.03 kg/(m^2). GEN: Well nourished, well developed, in no acute distress. Morbid obesity HEENT: normal Neck: no JVD, carotid bruits, or masses Cardiac: RRR; no murmurs, rubs, or gallops,no edema  Respiratory:  clear to auscultation bilaterally, normal work of breathing GI: soft, nontender, nondistended, + BS MS: no deformity or atrophy Skin: warm and dry, no rash Neuro:  Strength and sensation are intact Psych: euthymic  mood, full affect   EKG:  EKG is not ordered today. The ekg ordered today demonstrates the most recent tracing from late 2015 did not reveal evidence of infarction or ischemia.   Recent Labs: 04/16/2014: Pro B Natriuretic peptide (BNP) 40.8 07/29/2014: BUN 13; Creatinine 1.01; Hemoglobin 13.7; Platelets 235; Potassium 3.5*; Sodium 140 11/20/2014: ALT 15 12/02/2014: TSH 1.041    Lipid Panel    Component Value Date/Time   CHOL 170 11/20/2014 1055   TRIG 130.0 11/20/2014 1055   HDL 37.10* 11/20/2014 1055   CHOLHDL 5 11/20/2014 1055   VLDL  26.0 11/20/2014 1055   LDLCALC 107* 11/20/2014 1055      Wt Readings from Last 3 Encounters:  01/02/15 269 lb 12.8 oz (122.38 kg)  12/02/14 268 lb (121.564 kg)  12/02/14 267 lb 7 oz (121.309 kg)      Other studies Reviewed: Additional studies/ records that were reviewed today include: [facility] records. 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 He is going to see in endocrinologist today.  Dyslipidemia, goal LDL below 70     Current medicines are reviewed at length with the patient today.  The patient does not have concerns regarding medicines.  The following changes have been made:  no change  Labs/ tests ordered today include:   Orders Placed This Encounter  Procedures  . Myocardial Perfusion Imaging     Disposition:   FU with Linard Millers in 1 Year   Signed, Sinclair Grooms, MD  01/02/2015 9:17 AM    [facility] [place], [place],   [address] Phone: [phone]; Fax: [phone]

## 2015-01-05 ENCOUNTER — Telehealth: Payer: Self-pay | Admitting: Internal Medicine

## 2015-01-05 NOTE — Telephone Encounter (Signed)
Called pt and advised him per dosage of the U500 insulin. Pt understood.

## 2015-01-05 NOTE — Telephone Encounter (Signed)
Pt calling regarding email sent to Dr. Cruzita Lederer. Pt has questions about new regime with the U 500 insulin

## 2015-01-06 ENCOUNTER — Telehealth: Payer: Self-pay | Admitting: *Deleted

## 2015-01-06 NOTE — Telephone Encounter (Signed)
Pharmacy called stating pt came in to pick up his Humulin U500 and was confused on the dosage. Pt said he was suppose to get 14 units 3 x daily. I told her that the rx was for 60 units (.9ml). Advise me before calling the pt back, if I am directing the pt correctly. Thank you.

## 2015-01-06 NOTE — Telephone Encounter (Signed)
I sent him an email about it. He got it and responded.

## 2015-01-14 ENCOUNTER — Encounter: Payer: Self-pay | Admitting: Internal Medicine

## 2015-01-14 ENCOUNTER — Other Ambulatory Visit: Payer: Self-pay | Admitting: Internal Medicine

## 2015-01-14 DIAGNOSIS — E1159 Type 2 diabetes mellitus with other circulatory complications: Secondary | ICD-10-CM

## 2015-01-14 MED ORDER — INSULIN REGULAR HUMAN (CONC) 500 UNIT/ML ~~LOC~~ SOLN: 70.0000 [IU] | Freq: Three times a day (TID) | SUBCUTANEOUS | Status: DC

## 2015-01-14 NOTE — Progress Notes (Signed)
Received email from patient 01/13/2015:  Good Morning Dr. Cruzita Lederer:  I am happy to share with you that the RU- 500 is really working, I have only experience low sugar level below 95 only 3 times.  My energy level is really great. The pharmacy at [place] would like for you to send a new prescription saying I am only to use 14 units three times a day, on the last prescription you had 60 units.  Again thank you and have a great day.  Edward [name]   Will scan the sugar log - there really fluctuating between 105 and 240 mostly. He is apparently taking just 12 units of insulin if the sugars are above 140, and 14 units 20 minutes before a meal otherwise.  Replied today:  Dear [name],  Sorry for the delay in my answer! I do not check my email quite every day.  The sugars are a little higher than at last visit...  I am not sure I understand exactly how you're using the U500, especially since you mentioned that if the sugar level is above 140, you only take 12 units. Otherwise, you take 14 units.  I also saw that you're taking the insulin 20 minutes before a meal. We can try to move it ~30 minutes before a meal, if you can do that.  I think for now, I would stay with 14 units before each meal, but we might need to adjust the dose depending on the pre-meal sugars in the future. Please do not decrease the dose to 12 units if the sugars are >140. We will send the insulin prescription to [facility] ASAP.  Please let me know again about if sugars in another week or so. Sincerely  [name]

## 2015-01-15 ENCOUNTER — Encounter: Payer: Self-pay | Admitting: Neurology

## 2015-01-15 ENCOUNTER — Ambulatory Visit
Admission: RE | Admit: 2015-01-15 | Discharge: 2015-01-15 | Disposition: A | Discharge status: Home / Self Care | Payer: Medicare Other | Source: Ambulatory Visit | Attending: Neurology | Admitting: Neurology

## 2015-01-15 ENCOUNTER — Ambulatory Visit (INDEPENDENT_AMBULATORY_CARE_PROVIDER_SITE_OTHER): Payer: Medicare Other | Admitting: Neurology

## 2015-01-15 ENCOUNTER — Other Ambulatory Visit: Payer: Self-pay | Admitting: Neurology

## 2015-01-15 VITALS — BP 180/80 | HR 84 | Ht 68.0 in | Wt 282.0 lb

## 2015-01-15 DIAGNOSIS — G5621 Lesion of ulnar nerve, right upper limb: Secondary | ICD-10-CM

## 2015-01-15 DIAGNOSIS — E0842 Diabetes mellitus due to underlying condition with diabetic polyneuropathy: Secondary | ICD-10-CM

## 2015-01-15 DIAGNOSIS — G250 Essential tremor: Secondary | ICD-10-CM

## 2015-01-15 DIAGNOSIS — I25118 Atherosclerotic heart disease of native coronary artery with other forms of angina pectoris: Secondary | ICD-10-CM | POA: Diagnosis not present

## 2015-01-15 DIAGNOSIS — R51 Headache: Secondary | ICD-10-CM | POA: Diagnosis not present

## 2015-01-15 DIAGNOSIS — G44209 Tension-type headache, unspecified, not intractable: Secondary | ICD-10-CM | POA: Diagnosis not present

## 2015-01-15 MED ORDER — CYCLOBENZAPRINE HCL 5 MG PO TABS: 5.0000 mg | ORAL_TABLET | Freq: Every evening | ORAL | Status: DC | PRN

## 2015-01-15 NOTE — Patient Instructions (Addendum)
Check blood work CT head Continue medications as you are taking  Return to clinic in 3 months

## 2015-01-15 NOTE — Progress Notes (Addendum)
Follow-up Visit   Date: 01/15/2015    MEADE HOGELAND MRN: [id number] DOB: 28-Jul-1941   Interim History: Edward [name] is a 74 y.o. right-handed African American male with history of history of coronary artery disease, TIA (2002, manifested as right sided numbness), diabetes mellitus type 2 (HbA1c 11.1), hypertension, hyperlipidemia, GERD and BPH returning to the clinic for follow-up of diabetic neuropathy, essential tremor, and new complaints of headaches.  The patient was accompanied to the clinic by self.  History of present illness: He moved from [place] in February 2015 to be closer to his brother. He has been diabetic since 1985 and in late 1990s, he developed numbness > tingling of the feet which seems to have been stable since onset. He currently complains of tingling and numbness below the level of the ankles, worse on the right side. He has interemittent numbness/tingling of the right hand and also complains of muscle cramps. He reports falling twice this year, once when he was climbing stairs. He ambulates independently. He saw Dr. Etter Sjogren in April who started neurontin 152m twice daily which seems to help slightly.  He has previously been a patient [place]. NGery Prayin [place] His last clinic note dated 08/14/2013, indicates that he was being seen for history of memory loss and headaches. Headaches improved significantly after he retired. He has short-term memory problems, which does not interefere with daily life. Mostly forgetting little details or tasks. He is driving and has not been involving in any accidents. He is able to do all his IADLs and ADLs. No problems with finances.  UPDATE 12/02/2014:  Patient is here for follow-up visit.  He was last seen in June and unfortunately, due to other medical problems that came up, he was unable to be seen in the clinic here until now.  He underwent right shoulder surgery in September 2015 and since then he has noticed a  change in his voice.  He complains of stiffness of his arms, especially in the morning.  He had a fall in December 2015 after slipping on some grease in the kitchen and was unable to get up himself, so called for his brother.  No further falls since then.  He complains about bilateral hand tremor and voice change which is worse when he is stressed. His handwriting has become worse and he has more difficulty with fine motor tasks.  Tremors are predominately worse when trying to do tasks, but can be present when he is resting, too.   He does not drink alcohol or caffiene.  Endorses chronic constipation.  No change in taste/small, vivid dreams, or slowed movements.   His two brothers have tremors. No family history of parkinson's disease. Paresthesias are not very bothersome.    UPDATE 01/15/2015:    He reports having weight gain because of having his insulin changed and noticed new onset headaches.  His headaches are sharp and located over the parietal region.  It lasts 3-4 hours and occurs several times per week. He takes ibuprofen as needed, which help.   He has chronic vision problems and is seeing his eye doctor.  His has noticed improvement since start inderal because he is able to write his name better.  He also states that since paying attention to how his right arm is positioned, his hand paresthesias are better.  Today, he has moderate bilateral ptosis which he noticed over the past several days, denies double vision, weakness, or trouble swallowing.  Medications:  Current Outpatient Prescriptions on File Prior to Visit  Medication Sig Dispense Refill  . amLODipine (NORVASC) 10 MG tablet Take 1 tablet (10 mg total) by mouth daily. 30 tablet 11  . aspirin EC 81 MG tablet Take 81 mg by mouth daily.    . Blood Glucose Monitoring Suppl (ONE TOUCH ULTRA SYSTEM KIT) W/DEVICE KIT Use to test blood sugar daily as instructed. Dx code: E11.59 1 each 0  . Ciclopirox 1 % shampoo Massage in scalp 3x a week  and leave on for 10 min and rinse 120 mL 0  . clonazePAM (KLONOPIN) 1 MG tablet Take 1 tablet (1 mg total) by mouth at bedtime as needed for anxiety. 30 tablet 0  . glucose blood (BL TEST STRIP PACK) test strip Use to test blood sugar 4 times daily as instructed. Dx code: 250.00 200 each 4  . HYDROcodone-acetaminophen (NORCO) 5-325 MG per tablet Take 1 tablet by mouth every 6 (six) hours as needed. 60 tablet 0  . insulin regular human CONCENTRATED (HUMULIN R) 500 UNIT/ML SOLN injection Inject 0.14 mLs (70 Units total) into the skin 3 (three) times daily with meals. 20 mL 1  . Insulin Syringe-Needle U-100 (BD INSULIN SYRINGE ULTRAFINE) 31G X 15/64" 0.5 ML MISC Use 3x a day 100 each 3  . Insulin Syringes, Disposable, U-100 1 ML MISC 60 Units by Does not apply route 4 (four) times daily. 120 each 2  . Lancets (ONETOUCH ULTRASOFT) lancets Use to test blood sugar 4 times daily as instructed. Dx code: 250.00 200 each 4  . lisinopril (PRINIVIL,ZESTRIL) 20 MG tablet Take 1 tablet (20 mg total) by mouth daily. 30 tablet 6  . metoprolol (LOPRESSOR) 50 MG tablet Take 1 tablet (50 mg total) by mouth 2 (two) times daily. 60 tablet 6  . nitroGLYCERIN (NITROSTAT) 0.4 MG SL tablet Place 1 tablet (0.4 mg total) under the tongue every 5 (five) minutes as needed for chest pain (CP or SOB). 25 tablet 4  . oxyCODONE-acetaminophen (ROXICET) 5-325 MG per tablet Take 1 tablet by mouth every 4 (four) hours as needed for severe pain. 60 tablet 0  . pantoprazole (PROTONIX) 40 MG tablet Take 1 tablet (40 mg total) by mouth daily at 6 (six) AM. 30 tablet 11  . pravastatin (PRAVACHOL) 20 MG tablet Take 1 tablet (20 mg total) by mouth daily. 30 tablet 2  . propranolol (INDERAL) 20 MG tablet Take 1 tablet (20 mg total) by mouth 2 (two) times daily. 60 tablet 5   No current facility-administered medications on file prior to visit.    Allergies: No Known Allergies  Review of Systems:  CONSTITUTIONAL: No fevers, chills, night  sweats, or weight loss.  EYES: No visual changes or eye pain ENT: No hearing changes.  No history of nose bleeds.   RESPIRATORY: No cough, wheezing and shortness of breath.   CARDIOVASCULAR: Negative for chest pain, and palpitations.   GI: Negative for abdominal discomfort, blood in stools or black stools.  No recent change in bowel habits.   GU:  No history of incontinence.   MUSCLOSKELETAL: +history of joint pain or swelling.  No myalgias.   SKIN: Negative for lesions, rash, and itching.   ENDOCRINE: Negative for cold or heat intolerance, polydipsia or goiter.   PSYCH:  no depression or anxiety symptoms.   NEURO: As Above.   Vital Signs:  BP 180/80 mmHg  Pulse 84  Ht _0  (1.727 m)  Wt 282 lb (127.914 kg)  BMI 42.89 kg/m2  SpO2 96%  General:  He has allodynia over the right parietal region  Neurological Exam: MENTAL STATUS including orientation to time, place, person, recent and remote memory, attention span and concentration, language, and fund of knowledge is normal.  Tremulous speech pattern, he is able to enunciate lingual and gutteral sounds well.  There is no dysarthria.   CRANIAL NERVES:  No visual field defects. Pupils equal round and reactive to light.  Normal conjugate, extra-ocular eye movements in all directions of gaze.  Mild to moderatel bilateral ptosis without worsening with sustained upward gaze. Normal facial sensation.  Face is symmetric. Facial muscles are intact.  Palate elevates symmetrically.  Tongue is midline.    MOTOR:  Motor strength is 5/5 in all extremities.  Subtle intention tremor at end-point (improved). Tone is normal.    MSRs:   Right                                                                 Left brachioradialis 2+  brachioradialis 2+  biceps 1+  biceps 1+  triceps 1+  triceps 1+  patellar 0  Patellar 0  ankle jerk 0  ankle jerk 0     COORDINATION/GAIT:    Gait narrow based and stable, turns with 3 steps.   Data: EMG  12/01/2014: 1. 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 Overall, these findings are moderately severe in degree electrically.  2. 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 There is no evidence of a cervical/lumbosacral radiculopathy affecting the right side  Lab Results  Component Value Date   HGBA1C 11.1* 12/02/2014   Labs 04/08/2014:  Copper 64*, ceruloplasmin 18, vitamin B12 290, SPEP/UPEP with IFE no M protein  CT head 10/21/2014:  Negative   IMPRESSION/PLAN: 1.  Tension headaches vs medication effect, new since taking insulin injections CT head without contrast Encouraged to limit rescue medications (NSAIDs, tylenol) to twice per week to avoid medication overuse headaches Flexeril as needed for pain If headaches do not improve, may need to consider daily preventative medication  2.  Bilateral ptosis, no evidence of facial or limb weakness but for completeness will check MG antibodies Myasthenia gravis panel  3.  Large fiber diabetic neuropathy affecting the hands and feet, uncontrolled diabetes EMG with moderately severe neuropathy affecting the right side Paresthesias not bothersome enough to start medications Encouraged tight glycemic control  4.  Essential tremor involving the hands and voice, improved Continue propranolol 63m BID  5.  Right ulnar neuropathy at the elbow, improved.   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   6.  Return to clinic in 3 months  The duration of this appointment visit was 30 minutes of face-to-face time with the patient.  Greater than 50% of this time was spent in counseling, explanation of diagnosis, planning of further management, and  coordination of care.   Thank you for allowing me to participate in patient's care.  If I can answer any additional questions, I would be pleased to  do so.    Sincerely,    Zayvon Alicea K. Posey Pronto, DO

## 2015-01-19 ENCOUNTER — Ambulatory Visit (HOSPITAL_COMMUNITY): Payer: Medicare Other | Attending: Interventional Cardiology | Admitting: Radiology

## 2015-01-19 DIAGNOSIS — I10 Essential (primary) hypertension: Secondary | ICD-10-CM | POA: Insufficient documentation

## 2015-01-19 DIAGNOSIS — R079 Chest pain, unspecified: Secondary | ICD-10-CM | POA: Insufficient documentation

## 2015-01-19 DIAGNOSIS — E109 Type 1 diabetes mellitus without complications: Secondary | ICD-10-CM | POA: Insufficient documentation

## 2015-01-19 DIAGNOSIS — R002 Palpitations: Secondary | ICD-10-CM | POA: Insufficient documentation

## 2015-01-19 DIAGNOSIS — Z794 Long term (current) use of insulin: Secondary | ICD-10-CM | POA: Diagnosis not present

## 2015-01-19 DIAGNOSIS — I25118 Atherosclerotic heart disease of native coronary artery with other forms of angina pectoris: Secondary | ICD-10-CM

## 2015-01-19 DIAGNOSIS — I209 Angina pectoris, unspecified: Secondary | ICD-10-CM | POA: Diagnosis not present

## 2015-01-19 DIAGNOSIS — E785 Hyperlipidemia, unspecified: Secondary | ICD-10-CM | POA: Diagnosis not present

## 2015-01-19 MED ORDER — REGADENOSON 0.4 MG/5ML IV SOLN
0.4000 mg | Freq: Once | INTRAVENOUS | Status: AC
  Administered 2015-01-19: 0.4 mg via INTRAVENOUS

## 2015-01-19 MED ORDER — TECHNETIUM TC 99M SESTAMIBI GENERIC - CARDIOLITE
30.0000 | Freq: Once | INTRAVENOUS | Status: AC | PRN
  Administered 2015-01-19: 30 via INTRAVENOUS

## 2015-01-19 MED ORDER — TECHNETIUM TC 99M SESTAMIBI GENERIC - CARDIOLITE
10.0000 | Freq: Once | INTRAVENOUS | Status: AC | PRN
  Administered 2015-01-19: 10 via INTRAVENOUS

## 2015-01-19 NOTE — Progress Notes (Signed)
[facility] [address] [place], [place] [address] [phone]    Cardiology Nuclear Med Study  Edward [name] is a 74 y.o. male     MRN : [id number]     DOB: 04-29-1941  Procedure Date: 01/19/2015  Nuclear Med Background Indication for Stress Test:  Evaluation for Ischemia History:  CAD, MPI-DC Cardiac Risk Factors: Carotid Disease, Hypertension, IDDM Type 1 and Lipids  Symptoms:  Chest Pain, Dizziness and Palpitations   Nuclear Pre-Procedure Caffeine/Decaff Intake:  None NPO After: 7:00pm   Lungs:  clear O2 Sat: 96% on room air. IV 0.9% NS with Angio Cath:  22g  IV Site: L Antecubital  IV Started by:  Perrin Maltese, EMT-P  Chest Size (in):  50 Cup Size: n/a  Height: 5\' 8"  (1.727 m)  Weight:  277 lb (125.646 kg)  BMI:  Body mass index is 42.13 kg/(m^2). Tech Comments:  No Rx this am CBG 202 mg/dl @9 :15    Nuclear Med Study 1 or 2 day study: 1 day  Stress Test Type:  Lexiscan  Reading MD: n/a  Order Authorizing Provider:  Daneen Schick III  Resting Radionuclide: Technetium 69m Sestamibi  Resting Radionuclide Dose: 11.0 mCi   Stress Radionuclide:  Technetium 65m Sestamibi  Stress Radionuclide Dose: 33.0 mCi           Stress Protocol Rest HR: 65 Stress HR: 79  Rest BP: 143/56 Stress BP: 161/54  Exercise Time (min): n/a METS: n/a   Predicted Max HR: 148 bpm % Max HR: 53.38 bpm Rate Pressure Product: 12719    Dose of Adenosine (mg):  n/a Dose of Lexiscan: 0.4 mg  Dose of Atropine (mg): n/a Dose of Dobutamine: n/a mcg/kg/min (at max HR)  Stress Test Technologist: Ileene Hutchinson, EMT-P  Nuclear Technologist:  Earl Many, CNMT     Rest Procedure:  Myocardial perfusion imaging was performed at rest 45 minutes following the intravenous administration of Technetium 43m Sestamibi. Rest ECG: NSR with non-specific ST-T wave changes  Stress Procedure:  The patient received IV Lexiscan 0.4 mg over 15-seconds.  Technetium 2m Sestamibi  injected at 30-seconds.  Quantitative spect images were obtained after a 45 minute delay. Stress ECG: No significant ST segment change suggestive of ischemia.  QPS Raw Data Images:  Normal; no motion artifact; normal heart/lung ratio. Stress Images:  There is decreased uptake in the anterior wall. Rest Images:  There is decreased uptake in the anterior wall. Subtraction (SDS):  There is mild peri-infarct ischemia. Transient Ischemic Dilatation (Normal <1.22):  1.22 Lung/Heart Ratio (Normal <0.45):  0.38  Quantitative Gated Spect Images QGS EDV:  104 ml QGS ESV:  26 ml  Impression Exercise Capacity:  Lexiscan with no exercise. BP Response:  Normal blood pressure response. Clinical Symptoms:  Mild chest pain/dyspnea. ECG Impression:  No significant ST segment change suggestive of ischemia. Comparison with Prior Nuclear Study: No images to compare  Overall Impression:  Intermediate risk stress nuclear study.  There is medium size predominantly fixed defect involving the apex and apical anterior and apical lateral segments. There is minimal reversibility. SDS =1.  The LV systolic function is vigorous with no segmental wall motion abnormalities.  LV Ejection Fraction: 75%.  LV Wall Motion:  Normal Wall Motion  Darlin Coco MD

## 2015-01-20 ENCOUNTER — Encounter: Payer: Self-pay | Admitting: Family Medicine

## 2015-01-21 LAB — MYASTHENIA GRAVIS PANEL 2
Acetylcholine Rec Binding: <0.3 nmol/L
Acetylcholine Rec Mod Ab: 12 %
Aceytlcholine Rec Bloc Ab: <15 % of inhibition (ref <15)

## 2015-01-22 ENCOUNTER — Telehealth: Payer: Self-pay

## 2015-01-22 DIAGNOSIS — Z01812 Encounter for preprocedural laboratory examination: Secondary | ICD-10-CM

## 2015-01-22 NOTE — Telephone Encounter (Signed)
called to give pt myoview results and Dr.Smith's recommendation.Marland Kitchenlmtcb

## 2015-01-22 NOTE — Telephone Encounter (Signed)
Informed Dr. Tamala Julian rec cath as result of nuclear study Scheduled left heart cath for 01/30/15 10:30 am; arrive 8:30 am Pt will come Tues 3/29 for labs.  Appointment made.  Instructed that Dr. Thompson Caul primary CMA will contact him re: holding medications/cath instructions next week. Pt verbalizes understanding and agreement.  Cardiology checklist completed and placed at [facility] desk.

## 2015-01-22 NOTE — Telephone Encounter (Signed)
-----   Message from Belva Crome, MD sent at 01/20/2015  7:51 AM EDT ----- Intermediate risk nuclear study with anteroapical partially reversible defect. Needs cardiac catheterization set on one of my upcoming cath days.

## 2015-01-22 NOTE — Telephone Encounter (Signed)
°

## 2015-01-27 ENCOUNTER — Other Ambulatory Visit (INDEPENDENT_AMBULATORY_CARE_PROVIDER_SITE_OTHER): Payer: Medicare Other | Admitting: *Deleted

## 2015-01-27 ENCOUNTER — Encounter: Payer: Self-pay | Admitting: Interventional Cardiology

## 2015-01-27 DIAGNOSIS — I10 Essential (primary) hypertension: Secondary | ICD-10-CM

## 2015-01-27 DIAGNOSIS — Z5181 Encounter for therapeutic drug level monitoring: Secondary | ICD-10-CM | POA: Diagnosis not present

## 2015-01-27 DIAGNOSIS — Z01812 Encounter for preprocedural laboratory examination: Secondary | ICD-10-CM

## 2015-01-27 LAB — CBC
HEMATOCRIT: 38.3 % — AB (ref 39.0–52.0)
Hemoglobin: 12.9 g/dL — ABNORMAL LOW (ref 13.0–17.0)
MCHC: 33.8 g/dL (ref 30.0–36.0)
MCV: 84.4 fl (ref 78.0–100.0)
PLATELETS: 245 10*3/uL (ref 150.0–400.0)
RBC: 4.53 Mil/uL (ref 4.22–5.81)
RDW: 14.4 % (ref 11.5–15.5)
WBC: 6.8 10*3/uL (ref 4.0–10.5)

## 2015-01-27 LAB — BASIC METABOLIC PANEL
BUN: 9 mg/dL (ref 6–23)
CALCIUM: 8.9 mg/dL (ref 8.4–10.5)
CO2: 28 meq/L (ref 19–32)
Chloride: 103 mEq/L (ref 96–112)
Creatinine, Ser: 1.01 mg/dL (ref 0.40–1.50)
GFR: 93.21 mL/min (ref >60.00)
GLUCOSE: 261 mg/dL — AB (ref 70–99)
Potassium: 3.8 mEq/L (ref 3.5–5.1)
SODIUM: 138 meq/L (ref 135–145)

## 2015-01-27 LAB — PROTIME-INR
INR: 0.9 ratio (ref 0.8–1.0)
Prothrombin Time: 10.2 s (ref 9.6–13.1)

## 2015-01-27 NOTE — Addendum Note (Signed)
Addended by: Eulis Foster on: 01/27/2015 09:45 AM   Modules accepted: Orders

## 2015-01-29 NOTE — Telephone Encounter (Signed)
Returned pt call. Pt given verbal pre-cath instructions with verbal understanding.NoPo after midnight, No insulin the morning of procedure. Adv pt that his cardiac cath is considered an out pt procedure, there would be no medical justification for him to stay overnight if his cath is negative and there is no intervention needed. Adv him that we could reschedule if he does not have any one to drive him home after his procedure. He sts that  His neighbor will be giving him a ride and he would like to proceed as planned.

## 2015-01-29 NOTE — Telephone Encounter (Signed)
Addressed in telephone encounter on 3/31

## 2015-01-29 NOTE — H&P (View-Only) (Signed)
Cardiology Office Note   Date:  01/02/2015   ID:  Edward [name], DOB 12-14-1940, MRN [id number]  PCP:  Garnet Koyanagi, DO  Cardiologist:   Sinclair Grooms, MD   No chief complaint on file.     History of Present Illness: Edward [name] is a 74 y.o. male who presents for  pain in chest. Precipitated by activity. Also occurs when he lies down. In further questioning, there are 2 types of discomfort. The discomfort that occurs with activity as a pressure-like sensation. The discomfort when he lies down he is a palpitation. There is also mild orthopnea. He has had no prolonged episodes of pain. Prior catheterization at [facility] demonstrated noncritical LAD disease in 2013.    Past Medical History  Diagnosis Date  . Hypertension   . Arthritis   . CAD (coronary artery disease)     a. Pt reports "small heart attack" in 2013 at [facility], denies need for stent or CABG, does not know findings of cath.  Marland Kitchen TIA (transient ischemic attack) 2002  . Dyslipidemia, goal LDL below 70 01/29/2014  . Heart attack 02/03/2014    "mild heart attack"  . GERD (gastroesophageal reflux disease)   . Enlarged prostate   . Shortness of breath     exertion  . Sleep apnea     does not use cpap  . Pneumonia 01/2013  . Pneumonia 03/2014    hospitalized  . DM type 2 (diabetes mellitus, type 2), insulin dependent 01/29/2014    fasting cbg 50-120 with new regimen  . Anxiety   . IBBCWUGQ(916.9)     Past Surgical History  Procedure Laterality Date  . Total knee arthroplasty Bilateral   . Cardiac catheterization  01/28/14    + CAD treat medically  . Lumbar disc surgery    . Cataract extraction Bilateral   . Joint replacement Bilateral     knee   . Back surgery    . Eye surgery Bilateral   . Shoulder arthroscopy Right 07/30/2014    Procedure: Right Shoulder Arthroscopy, Debridement, Decompression, Manipulation Under Anesthesia;  Surgeon: Newt Minion, MD;  Location: [place];   Service: Orthopedics;  Laterality: Right;  . Left heart catheterization with coronary angiogram N/A 01/28/2014    Procedure: LEFT HEART CATHETERIZATION WITH CORONARY ANGIOGRAM;  Surgeon: Sinclair Grooms, MD;  Location: [facility] CATH LAB;  Service: Cardiovascular;  Laterality: N/A;     Current Outpatient Prescriptions  Medication Sig Dispense Refill  . amLODipine (NORVASC) 10 MG tablet Take 1 tablet (10 mg total) by mouth daily. 30 tablet 11  . aspirin EC 81 MG tablet Take 81 mg by mouth daily.    . Blood Glucose Monitoring Suppl (ONE TOUCH ULTRA SYSTEM KIT) W/DEVICE KIT Use to test blood sugar daily as instructed. Dx code: E11.59 1 each 0  . Ciclopirox 1 % shampoo Massage in scalp 3x a week and leave on for 10 min and rinse 120 mL 0  . clonazePAM (KLONOPIN) 1 MG tablet Take 1 tablet (1 mg total) by mouth at bedtime as needed for anxiety. 30 tablet 0  . cyclobenzaprine (FLEXERIL) 5 MG tablet Take 1 tablet (5 mg total) by mouth 3 (three) times daily as needed for muscle spasms. 30 tablet 0  . glucose blood (BL TEST STRIP PACK) test strip Use to test blood sugar 4 times daily as instructed. Dx code: 250.00 200 each 4  . HYDROcodone-acetaminophen (NORCO) 5-325 MG per tablet  Take 1 tablet by mouth every 6 (six) hours as needed. 60 tablet 0  . insulin regular human CONCENTRATED (HUMULIN R) 500 UNIT/ML SOLN injection Inject 0.12 mLs (60 Units total) into the skin 3 (three) times daily with meals. 20 mL 2  . Insulin Syringe-Needle U-100 (BD INSULIN SYRINGE ULTRAFINE) 31G X 15/64" 0.5 ML MISC Use 3x a day 100 each 3  . Insulin Syringes, Disposable, U-100 1 ML MISC 60 Units by Does not apply route 4 (four) times daily. 120 each 2  . Lancets (ONETOUCH ULTRASOFT) lancets Use to test blood sugar 4 times daily as instructed. Dx code: 250.00 200 each 4  . lisinopril (PRINIVIL,ZESTRIL) 20 MG tablet Take 1 tablet (20 mg total) by mouth daily. 30 tablet 6  . metoprolol (LOPRESSOR) 50 MG tablet Take 1 tablet (50 mg  total) by mouth 2 (two) times daily. 60 tablet 6  . nitroGLYCERIN (NITROSTAT) 0.4 MG SL tablet Place 1 tablet (0.4 mg total) under the tongue every 5 (five) minutes as needed for chest pain (CP or SOB). 25 tablet 4  . oxyCODONE-acetaminophen (ROXICET) 5-325 MG per tablet Take 1 tablet by mouth every 4 (four) hours as needed for severe pain. 60 tablet 0  . pantoprazole (PROTONIX) 40 MG tablet Take 1 tablet (40 mg total) by mouth daily at 6 (six) AM. 30 tablet 11  . pravastatin (PRAVACHOL) 20 MG tablet Take 1 tablet (20 mg total) by mouth daily. 30 tablet 2  . propranolol (INDERAL) 20 MG tablet Take 1 tablet (20 mg total) by mouth 2 (two) times daily. 60 tablet 5   No current facility-administered medications for this visit.    Allergies:   Review of patient's allergies indicates no known allergies.    Social History:  The patient  reports that he has never smoked. He has never used smokeless tobacco. He reports that he does not drink alcohol or use illicit drugs.   Family History:  The patient's family history includes Alzheimer's disease in his sister; CAD in his brother and sister; Diabetes in his other; Prostate cancer in his brother.    ROS:  Please see the history of present illness.   Otherwise, review of systems are positive for significant abnormality in glucose control.   All other systems are reviewed and negative.    PHYSICAL EXAM: VS:  BP 148/70 mmHg  Pulse 91  Ht '5\' 8"'  (1.727 m)  Wt 269 lb 12.8 oz (122.38 kg)  BMI 41.03 kg/m2 , BMI Body mass index is 41.03 kg/(m^2). GEN: Well nourished, well developed, in no acute distress. Morbid obesity HEENT: normal Neck: no JVD, carotid bruits, or masses Cardiac: RRR; no murmurs, rubs, or gallops,no edema  Respiratory:  clear to auscultation bilaterally, normal work of breathing GI: soft, nontender, nondistended, + BS MS: no deformity or atrophy Skin: warm and dry, no rash Neuro:  Strength and sensation are intact Psych: euthymic  mood, full affect   EKG:  EKG is not ordered today. The ekg ordered today demonstrates the most recent tracing from late 2015 did not reveal evidence of infarction or ischemia.   Recent Labs: 04/16/2014: Pro B Natriuretic peptide (BNP) 40.8 07/29/2014: BUN 13; Creatinine 1.01; Hemoglobin 13.7; Platelets 235; Potassium 3.5*; Sodium 140 11/20/2014: ALT 15 12/02/2014: TSH 1.041    Lipid Panel    Component Value Date/Time   CHOL 170 11/20/2014 1055   TRIG 130.0 11/20/2014 1055   HDL 37.10* 11/20/2014 1055   CHOLHDL 5 11/20/2014 1055   VLDL  26.0 11/20/2014 1055   LDLCALC 107* 11/20/2014 1055      Wt Readings from Last 3 Encounters:  01/02/15 269 lb 12.8 oz (122.38 kg)  12/02/14 268 lb (121.564 kg)  12/02/14 267 lb 7 oz (121.309 kg)      Other studies Reviewed: Additional studies/ records that were reviewed today include: [facility] records. Review of the above records demonstrates: No new data   ASSESSMENT AND PLAN:  Ischemic chest pain - Plan to perform a Myocardial Perfusion Imaging  Coronary artery disease with other forms of angina pectoris: Prior coronary angiography found less than critical coronary disease in 2013  Essential hypertension: Borderline control  Type 2 diabetes mellitus with other circulatory complications: I3B greater than 10 recently. He is going to see in endocrinologist today.  Dyslipidemia, goal LDL below 70     Current medicines are reviewed at length with the patient today.  The patient does not have concerns regarding medicines.  The following changes have been made:  no change  Labs/ tests ordered today include:   Orders Placed This Encounter  Procedures  . Myocardial Perfusion Imaging     Disposition:   FU with Linard Millers in 1 Year   Signed, Sinclair Grooms, MD  01/02/2015 9:17 AM    [facility] [place], [place], [place]  [address] Phone: [phone]; Fax: [phone]

## 2015-01-29 NOTE — Telephone Encounter (Signed)
Follow Up  Pt called requests a call back to discuss CATH procedure and if he should or shouldn't take his insulin before the procedure. He states that he is experiencing a lot of anxiety as well. He has contacted his insurance about staying over night in the hospital.

## 2015-01-29 NOTE — Interval H&P Note (Signed)
History and Physical Interval Note:  01/29/2015 11:09 AM Since the attached note, the patient has undergone a myocardial perfusion study that was graded as intermediate risk. Given his prior history and risk factors, angiography and possible PCI was recommended.Cath Lab Visit (complete for each Cath Lab visit)  Clinical Evaluation Leading to the Procedure:   ACS: No.  Non-ACS:    Anginal Classification: CCS III  Anti-ischemic medical therapy: Maximal Therapy (2 or more classes of medications)  Non-Invasive Test Results: Intermediate-risk stress test findings: cardiac mortality 1-3%/year  Prior CABG: No previous CABG       [name]  has presented today for surgery, with the diagnosis of cp  The various methods of treatment have been discussed with the patient and family. After consideration of risks, benefits and other options for treatment, the patient has consented to  Procedure(s): LEFT HEART CATHETERIZATION WITH CORONARY ANGIOGRAM (N/A) as a surgical intervention .  The patient's history has been reviewed, patient examined, no change in status, stable for surgery.  I have reviewed the patient's chart and labs.  Questions were answered to the patient's satisfaction.     Sinclair Grooms

## 2015-01-29 NOTE — Telephone Encounter (Signed)
Follow Up  Pt called requests a call back to discuss CATH procedure and if he should or shouldn't take his insulin before the procedure. He states that he is experiencing a lot of anxiety as well. He has contacted his insurance about staying over night in the hospital

## 2015-01-30 ENCOUNTER — Encounter (HOSPITAL_COMMUNITY): Payer: Self-pay | Admitting: General Practice

## 2015-01-30 ENCOUNTER — Encounter (HOSPITAL_COMMUNITY)
Admission: RE | Disposition: A | Discharge status: Home / Self Care | Payer: Federal, State, Local not specified - PPO | Source: Ambulatory Visit | Attending: Interventional Cardiology

## 2015-01-30 ENCOUNTER — Ambulatory Visit (HOSPITAL_COMMUNITY)
Admission: RE | Admit: 2015-01-30 | Discharge: 2015-01-31 | Disposition: A | Discharge status: Home / Self Care | Payer: Medicare Other | Source: Ambulatory Visit | Attending: Interventional Cardiology | Admitting: Interventional Cardiology

## 2015-01-30 DIAGNOSIS — I209 Angina pectoris, unspecified: Secondary | ICD-10-CM

## 2015-01-30 DIAGNOSIS — I200 Unstable angina: Secondary | ICD-10-CM

## 2015-01-30 DIAGNOSIS — G4730 Sleep apnea, unspecified: Secondary | ICD-10-CM | POA: Diagnosis not present

## 2015-01-30 DIAGNOSIS — Z96653 Presence of artificial knee joint, bilateral: Secondary | ICD-10-CM | POA: Diagnosis not present

## 2015-01-30 DIAGNOSIS — I252 Old myocardial infarction: Secondary | ICD-10-CM | POA: Diagnosis not present

## 2015-01-30 DIAGNOSIS — I25119 Atherosclerotic heart disease of native coronary artery with unspecified angina pectoris: Secondary | ICD-10-CM | POA: Insufficient documentation

## 2015-01-30 DIAGNOSIS — Z8673 Personal history of transient ischemic attack (TIA), and cerebral infarction without residual deficits: Secondary | ICD-10-CM | POA: Insufficient documentation

## 2015-01-30 DIAGNOSIS — K219 Gastro-esophageal reflux disease without esophagitis: Secondary | ICD-10-CM | POA: Insufficient documentation

## 2015-01-30 DIAGNOSIS — Z9861 Coronary angioplasty status: Secondary | ICD-10-CM

## 2015-01-30 DIAGNOSIS — E785 Hyperlipidemia, unspecified: Secondary | ICD-10-CM | POA: Diagnosis present

## 2015-01-30 DIAGNOSIS — I2510 Atherosclerotic heart disease of native coronary artery without angina pectoris: Secondary | ICD-10-CM

## 2015-01-30 DIAGNOSIS — F419 Anxiety disorder, unspecified: Secondary | ICD-10-CM | POA: Insufficient documentation

## 2015-01-30 DIAGNOSIS — E1159 Type 2 diabetes mellitus with other circulatory complications: Secondary | ICD-10-CM | POA: Diagnosis not present

## 2015-01-30 DIAGNOSIS — N400 Enlarged prostate without lower urinary tract symptoms: Secondary | ICD-10-CM | POA: Diagnosis not present

## 2015-01-30 DIAGNOSIS — Z794 Long term (current) use of insulin: Secondary | ICD-10-CM | POA: Insufficient documentation

## 2015-01-30 DIAGNOSIS — Z6841 Body Mass Index (BMI) 40.0 and over, adult: Secondary | ICD-10-CM | POA: Diagnosis not present

## 2015-01-30 DIAGNOSIS — I10 Essential (primary) hypertension: Secondary | ICD-10-CM | POA: Diagnosis not present

## 2015-01-30 DIAGNOSIS — M1990 Unspecified osteoarthritis, unspecified site: Secondary | ICD-10-CM | POA: Insufficient documentation

## 2015-01-30 DIAGNOSIS — Z79899 Other long term (current) drug therapy: Secondary | ICD-10-CM | POA: Diagnosis not present

## 2015-01-30 DIAGNOSIS — Z7982 Long term (current) use of aspirin: Secondary | ICD-10-CM | POA: Insufficient documentation

## 2015-01-30 HISTORY — PX: LEFT HEART CATHETERIZATION WITH CORONARY ANGIOGRAM: SHX5451

## 2015-01-30 HISTORY — PX: CORONARY ANGIOPLASTY WITH STENT PLACEMENT: SHX49

## 2015-01-30 LAB — GLUCOSE, CAPILLARY
GLUCOSE-CAPILLARY: 295 mg/dL — AB (ref 70–99)
GLUCOSE-CAPILLARY: 330 mg/dL — AB (ref 70–99)
GLUCOSE-CAPILLARY: 66 mg/dL — AB (ref 70–99)
GLUCOSE-CAPILLARY: 81 mg/dL (ref 70–99)
Glucose-Capillary: 111 mg/dL — ABNORMAL HIGH (ref 70–99)
Glucose-Capillary: 272 mg/dL — ABNORMAL HIGH (ref 70–99)

## 2015-01-30 LAB — POCT ACTIVATED CLOTTING TIME: Activated Clotting Time: 656 seconds

## 2015-01-30 SURGERY — LEFT HEART CATHETERIZATION WITH CORONARY ANGIOGRAM
Anesthesia: LOCAL

## 2015-01-30 MED ORDER — PROPRANOLOL HCL 20 MG PO TABS
20.0000 mg | ORAL_TABLET | Freq: Two times a day (BID) | ORAL | Status: DC
  Filled 2015-01-30 (×2): qty 1

## 2015-01-30 MED ORDER — CYCLOBENZAPRINE HCL 5 MG PO TABS
5.0000 mg | ORAL_TABLET | Freq: Every evening | ORAL | Status: DC | PRN
  Administered 2015-01-30: 5 mg via ORAL
  Filled 2015-01-30 (×2): qty 1

## 2015-01-30 MED ORDER — NITROGLYCERIN 1 MG/10 ML FOR IR/CATH LAB
INTRA_ARTERIAL | Status: AC
  Filled 2015-01-30: qty 10

## 2015-01-30 MED ORDER — ASPIRIN 81 MG PO CHEW: 81.0000 mg | CHEWABLE_TABLET | ORAL | Status: DC

## 2015-01-30 MED ORDER — SODIUM CHLORIDE 0.9 % IV SOLN
INTRAVENOUS | Status: DC
  Administered 2015-01-30: 09:00:00 via INTRAVENOUS

## 2015-01-30 MED ORDER — SODIUM CHLORIDE 0.9 % IV SOLN: 250.0000 mL | INTRAVENOUS | Status: DC | PRN

## 2015-01-30 MED ORDER — ASPIRIN 81 MG PO CHEW
81.0000 mg | CHEWABLE_TABLET | Freq: Every day | ORAL | Status: DC
  Administered 2015-01-31: 09:00:00 81 mg via ORAL
  Filled 2015-01-30: qty 1

## 2015-01-30 MED ORDER — SODIUM CHLORIDE 0.9 % IJ SOLN: 3.0000 mL | Freq: Two times a day (BID) | INTRAMUSCULAR | Status: DC

## 2015-01-30 MED ORDER — INSULIN SYRINGES (DISPOSABLE) U-100 1 ML MISC: 60.0000 [IU] | Freq: Four times a day (QID) | Status: DC

## 2015-01-30 MED ORDER — INSULIN ASPART 100 UNIT/ML ~~LOC~~ SOLN
7.0000 [IU] | Freq: Once | SUBCUTANEOUS | Status: AC
  Administered 2015-01-30: 7 [IU] via SUBCUTANEOUS

## 2015-01-30 MED ORDER — FUROSEMIDE 10 MG/ML IJ SOLN
20.0000 mg | Freq: Once | INTRAMUSCULAR | Status: AC
  Administered 2015-01-30: 17:00:00 20 mg via INTRAVENOUS
  Filled 2015-01-30: qty 2

## 2015-01-30 MED ORDER — NITROGLYCERIN IN D5W 200-5 MCG/ML-% IV SOLN: 5.0000 ug/min | INTRAVENOUS | Status: AC

## 2015-01-30 MED ORDER — ACETAMINOPHEN 325 MG PO TABS: 650.0000 mg | ORAL_TABLET | ORAL | Status: DC | PRN

## 2015-01-30 MED ORDER — INSULIN ASPART 100 UNIT/ML ~~LOC~~ SOLN
0.0000 [IU] | Freq: Three times a day (TID) | SUBCUTANEOUS | Status: DC
  Administered 2015-01-30 – 2015-01-31 (×2): 11 [IU] via SUBCUTANEOUS

## 2015-01-30 MED ORDER — FENTANYL CITRATE 0.05 MG/ML IJ SOLN
INTRAMUSCULAR | Status: AC
  Filled 2015-01-30: qty 2

## 2015-01-30 MED ORDER — ONDANSETRON HCL 4 MG/2ML IJ SOLN: 4.0000 mg | Freq: Four times a day (QID) | INTRAMUSCULAR | Status: DC | PRN

## 2015-01-30 MED ORDER — LISINOPRIL 20 MG PO TABS
20.0000 mg | ORAL_TABLET | Freq: Every day | ORAL | Status: DC
  Administered 2015-01-30 – 2015-01-31 (×2): 20 mg via ORAL
  Filled 2015-01-30 (×2): qty 1

## 2015-01-30 MED ORDER — HEPARIN (PORCINE) IN NACL 2-0.9 UNIT/ML-% IJ SOLN
INTRAMUSCULAR | Status: AC
  Filled 2015-01-30: qty 1500

## 2015-01-30 MED ORDER — PANTOPRAZOLE SODIUM 40 MG PO TBEC
40.0000 mg | DELAYED_RELEASE_TABLET | Freq: Every day | ORAL | Status: DC
  Administered 2015-01-31: 06:00:00 40 mg via ORAL
  Filled 2015-01-30 (×3): qty 1

## 2015-01-30 MED ORDER — METOPROLOL TARTRATE 50 MG PO TABS
50.0000 mg | ORAL_TABLET | Freq: Two times a day (BID) | ORAL | Status: DC
  Administered 2015-01-30 – 2015-01-31 (×2): 50 mg via ORAL
  Filled 2015-01-30 (×3): qty 1
  Filled 2015-01-30: qty 2
  Filled 2015-01-30: qty 1

## 2015-01-30 MED ORDER — MIDAZOLAM HCL 2 MG/2ML IJ SOLN
INTRAMUSCULAR | Status: AC
  Filled 2015-01-30: qty 2

## 2015-01-30 MED ORDER — TICAGRELOR 90 MG PO TABS
ORAL_TABLET | ORAL | Status: AC
  Filled 2015-01-30: qty 2

## 2015-01-30 MED ORDER — AMLODIPINE BESYLATE 5 MG PO TABS
10.0000 mg | ORAL_TABLET | Freq: Every day | ORAL | Status: DC
  Administered 2015-01-30 – 2015-01-31 (×2): 10 mg via ORAL
  Filled 2015-01-30 (×2): qty 2

## 2015-01-30 MED ORDER — NITROGLYCERIN IN D5W 200-5 MCG/ML-% IV SOLN
10.0000 ug/min | INTRAVENOUS | Status: DC
  Administered 2015-01-30: 10 ug/min via INTRAVENOUS

## 2015-01-30 MED ORDER — CLONAZEPAM 0.5 MG PO TABS
1.0000 mg | ORAL_TABLET | Freq: Every day | ORAL | Status: DC
  Administered 2015-01-30: 21:00:00 1 mg via ORAL
  Filled 2015-01-30: qty 2

## 2015-01-30 MED ORDER — LIDOCAINE HCL (PF) 1 % IJ SOLN
INTRAMUSCULAR | Status: AC
  Filled 2015-01-30: qty 30

## 2015-01-30 MED ORDER — VERAPAMIL HCL 2.5 MG/ML IV SOLN
INTRAVENOUS | Status: AC
Start: 2015-01-30 — End: 2015-01-30
  Filled 2015-01-30: qty 2

## 2015-01-30 MED ORDER — DEXTROSE 50 % IV SOLN
INTRAVENOUS | Status: AC
  Administered 2015-01-30: 25 mL
  Filled 2015-01-30: qty 50

## 2015-01-30 MED ORDER — NITROGLYCERIN IN D5W 200-5 MCG/ML-% IV SOLN
INTRAVENOUS | Status: AC
  Administered 2015-01-30: 10 ug/min via INTRAVENOUS
  Filled 2015-01-30: qty 250

## 2015-01-30 MED ORDER — SODIUM CHLORIDE 0.9 % IJ SOLN: 3.0000 mL | INTRAMUSCULAR | Status: DC | PRN

## 2015-01-30 MED ORDER — PRAVASTATIN SODIUM 20 MG PO TABS
20.0000 mg | ORAL_TABLET | Freq: Every day | ORAL | Status: DC
  Administered 2015-01-30 – 2015-01-31 (×2): 20 mg via ORAL
  Filled 2015-01-30 (×2): qty 1

## 2015-01-30 MED ORDER — BIVALIRUDIN 250 MG IV SOLR
INTRAVENOUS | Status: AC
  Filled 2015-01-30: qty 250

## 2015-01-30 MED ORDER — SODIUM CHLORIDE 0.9 % IV SOLN: INTRAVENOUS | Status: AC

## 2015-01-30 MED ORDER — INSULIN REGULAR HUMAN (CONC) 500 UNIT/ML ~~LOC~~ SOLN: 70.0000 [IU] | Freq: Three times a day (TID) | SUBCUTANEOUS | Status: DC

## 2015-01-30 MED ORDER — NITROGLYCERIN 0.4 MG SL SUBL: 0.4000 mg | SUBLINGUAL_TABLET | SUBLINGUAL | Status: DC | PRN

## 2015-01-30 MED ORDER — HYDROCODONE-ACETAMINOPHEN 5-325 MG PO TABS
1.0000 | ORAL_TABLET | Freq: Four times a day (QID) | ORAL | Status: DC | PRN
  Administered 2015-01-30 (×2): 1 via ORAL
  Filled 2015-01-30 (×2): qty 1

## 2015-01-30 MED ORDER — TICAGRELOR 90 MG PO TABS
90.0000 mg | ORAL_TABLET | Freq: Two times a day (BID) | ORAL | Status: DC
  Administered 2015-01-30 – 2015-01-31 (×2): 90 mg via ORAL
  Filled 2015-01-30 (×3): qty 1

## 2015-01-30 MED ORDER — HEPARIN SODIUM (PORCINE) 1000 UNIT/ML IJ SOLN
INTRAMUSCULAR | Status: AC
  Filled 2015-01-30: qty 1

## 2015-01-30 MED ORDER — DOCUSATE SODIUM 100 MG PO CAPS
100.0000 mg | ORAL_CAPSULE | Freq: Two times a day (BID) | ORAL | Status: DC | PRN
  Administered 2015-01-30: 18:00:00 100 mg via ORAL
  Filled 2015-01-30 (×3): qty 1

## 2015-01-30 NOTE — Progress Notes (Addendum)
   Developed sharp left parasternal chest pain when he got up to use the bathroom. No dyspnea or diaphoresis.  Chest pressure that he was having post procedure has resolved.  Currently in bathroom trying to have bowel movement and feels much better.  ECG is non acute.  Plan stool softener, wean and d/c NTG, and ambulate later today.  Will give one dose of furosemide (EDP was > 30 mmHg). Check BMET in AM.

## 2015-01-30 NOTE — Progress Notes (Signed)
Patient is sitting in chair and states he no longer has any pain. Tolerating sitting in chair. No shortness of breath noted. No complaints voiced or distress noted at this time. Call bell is in reach.

## 2015-01-30 NOTE — Progress Notes (Signed)
TR BAND REMOVAL  LOCATION:    right radial  DEFLATED PER PROTOCOL:    Yes.    TIME BAND OFF / DRESSING APPLIED:    1535  SITE UPON ARRIVAL:    Level 0  SITE AFTER BAND REMOVAL:    Level 0  REVERSE ALLEN'S TEST:     positive  CIRCULATION SENSATION AND MOVEMENT:    Within Normal Limits   Yes.    COMMENTS:   Patient tolerated procedure well. Dressing applied and is C/D/I.

## 2015-01-30 NOTE — Progress Notes (Signed)
Pt reports improvement in discomfort. ECG unremarkable.

## 2015-01-30 NOTE — Progress Notes (Signed)
Dr. Tamala Julian at bedside at this time. Patient is sitting in chair.

## 2015-01-30 NOTE — Progress Notes (Signed)
CRITICAL VALUE ALERT  Critical value received:  CBG 66  Date of notification:  01/30/2015  Time of notification:  0051  Critical value read back:Yes.    Nurse who received alert:  Kizzie Furnish.   Hypoglycemic protocol ordered and followed for NPO pt with CBG of 66. 30 min post D50 CBG is 111. Pt feeling much better.

## 2015-01-30 NOTE — Progress Notes (Signed)
Patient complains of chest pain that is sharp and increasing. Patient has been medicated for pain and at this time has no relief. Patient states, "This is a sharp pain that happened all of a sudden." EKG obtained and shows NS with 1st degree AV block. Dr. Tamala Julian notified and states he will be up to see and talk to patient and that he thinks this could be muscular skeletal related. He is also informed of fluctuating blood pressures and patient's request for Prilosec medication.

## 2015-01-30 NOTE — Progress Notes (Signed)
Pt reports increacing discomfort left chest now 6/10.  ECG repeated.

## 2015-01-30 NOTE — Interval H&P Note (Signed)
Cath Lab Visit (complete for each Cath Lab visit)  Clinical Evaluation Leading to the Procedure:   ACS: No.  Non-ACS:    Anginal Classification: CCS III  Anti-ischemic medical therapy: Maximal Therapy (2 or more classes of medications)  Non-Invasive Test Results: Intermediate-risk stress test findings: cardiac mortality 1-3%/year  Prior CABG: No previous CABG      History and Physical Interval Note:  01/30/2015 9:46 AM  Edward [name]  has presented today for surgery, with the diagnosis of cp  The various methods of treatment have been discussed with the patient and family. After consideration of risks, benefits and other options for treatment, the patient has consented to  Procedure(s): LEFT HEART CATHETERIZATION WITH CORONARY ANGIOGRAM (N/A) as a surgical intervention .  The patient's history has been reviewed, patient examined, no change in status, stable for surgery.  I have reviewed the patient's chart and labs.  Questions were answered to the patient's satisfaction.     Sinclair Grooms

## 2015-01-30 NOTE — CV Procedure (Signed)
Left Heart Catheterization with Coronary Angiography and DES Report  Edward [name]  74 y.o.  male April 24, 1941  Procedure Date: 01/30/2015 Referring Physician: H.W.B. Blenda Bridegroom, MD Primary Cardiologist: Same PCP: Renne Crigler MD; Garnet Koyanagi DO    INDICATIONS: 3-4 month history of exertional angina pectoris, intermediate risk myocardial perfusion study with anteroapical perfusion abnormality. Prior known history of coronary disease with intermediate stenosis in the mid LAD, 2015. Procedure is being done to reassess LAD and consider percutaneous intervention if lesion substrate is appropriate.  PROCEDURE: 1. Left heart catheterization; 2. Coronary angiography; 3. Left ventriculography; 4. DES LAD  CONSENT:  The risks, benefits, and details of the procedure were explained in detail to the patient. Risks including death, stroke, heart attack, kidney injury, allergy, limb ischemia, bleeding and radiation injury were discussed.  The patient verbalized understanding and wanted to proceed.  Informed written consent was obtained.  PROCEDURE TECHNIQUE:  After Xylocaine anesthesia a 5 French Slender sheath was placed in the right radial artery with an angiocath and the modified Seldinger technique.  Coronary angiography was done using a 5 F JR4 and JL 3.5 cm diagnostic catheters.  Left ventriculography was done using the JR 4 catheter and hand injection.   After review of the digital images it was felt that there been significant progression of the mid LAD stenosis with 85-90% narrowing noted. In light of the nuclear study in anginal symptoms despite therapy, proceeding with PCI was appropriate.  We upgraded to a 6 French 3.0 cm especially LAD guide catheter. Bivalirudin, bolus and infusion was administered with achievement of a therapeutic ACT greater than 300. Brilinta, 180 mg orally was administered. We then used a 0.014 [place] intracoronary guidewire to cross the stenosis in the mid LAD.  Predilatation was performed with a 2.5 x 12 mm balloon. We then positioned and deployed a 24 x 3.0 mm [employer]. We then post dilated with a 20 mm long by 3.5 mm [place] Euphora. Overlapping inflations were performed to a peak pressure of 15 atm. Post procedure the patient complained of mild chest tightness. No EKG changes were noted. An acceptable angiographic result was obtained. After observing the patient on the table for 15 minutes postprocedure, the case was terminated noting that chest discomfort was gradually subsiding.  The sheath was removed from the right radial and hemostasis achieved with a wrist band at 15 cc of air.   CONTRAST:  Total of 240 cc.  COMPLICATIONS:  None   HEMODYNAMICS:  Aortic pressure 159/75 mmHg; LV pressure 161/25 mmHg; LVEDP 33 mmHg  ANGIOGRAPHIC DATA:   The left main coronary artery is widely patent. There is distal 20% narrowing.  The left anterior descending artery is dominant. It wraps around the left ventricular apex. There is irregularity with up to 40% narrowing in the proximal to mid vessel. The mid vessel contains tandem significant stenoses with the proximal lesion located beyond the first diagonal of 85%. The more distal lesion appears to be in the 50-70% range. There is clear progression of the proximal lesion compared to the prior angiogram. The LAD wraps around the left ventricular apex and into the inferior interventricular groove  The left circumflex artery is widely patent. It gives origin to 3 small obtuse marginal branches. Irregularities are noted and no high-grade obstruction is seen..  The right coronary artery is dominant. He contains proximal mid and distal irregularities with 30-50% narrowing. No focally severe obstruction is noted. A twin PDA system arises distally and  is free of any significant obstruction.  PCI RESULTS: The LAD contained mid vessel tandem stenoses of 85% and 60%. After predilatation a 3.0 x 24 Promus Premier stent was  deployed and post dilated to 3.5 mm in diameter. Brisk TIMI grade 3 flow was noted and no significant stenosis was present post PCI.  LEFT VENTRICULOGRAM:  Left ventricular angiogram was done in the 30 RAO projection and revealed mild apical hypokinesis. EF 55%.   IMPRESSIONS:  1. Severe mid LAD stenosis/tandem with 85/60% stenosis reduced to 0% with TIMI grade 3 flow after DES implantation [employer]). Perfusion abnormality on the recent myocardial perfusion study and progressive angina fit the clinical picture. 2. Widely patent circumflex and right coronary 3. Normal left ventricular systolic function with elevated end-diastolic pressure compatible with diastolic heart failure   RECOMMENDATION:  Aspirin and Brilinta for at least 12 months  Optimize medical therapy to control blood pressure and lower left ventricular end-diastolic pressure. We should consider adding low-dose diuretic therapy if renal function does not bump after the contrast load. Consider adding hydrochlorothiazide 12.5 mg per day in a.m prior to discharge. Should be ready for discharge in a.m. assuming no complications.  We'll need follow-up and medication optimization 7-10 days following discharge.Marland Kitchen

## 2015-01-31 DIAGNOSIS — I10 Essential (primary) hypertension: Secondary | ICD-10-CM | POA: Diagnosis not present

## 2015-01-31 DIAGNOSIS — I25119 Atherosclerotic heart disease of native coronary artery with unspecified angina pectoris: Secondary | ICD-10-CM | POA: Diagnosis not present

## 2015-01-31 DIAGNOSIS — K219 Gastro-esophageal reflux disease without esophagitis: Secondary | ICD-10-CM | POA: Diagnosis not present

## 2015-01-31 DIAGNOSIS — I209 Angina pectoris, unspecified: Secondary | ICD-10-CM | POA: Diagnosis not present

## 2015-01-31 DIAGNOSIS — E1159 Type 2 diabetes mellitus with other circulatory complications: Secondary | ICD-10-CM

## 2015-01-31 DIAGNOSIS — I252 Old myocardial infarction: Secondary | ICD-10-CM | POA: Diagnosis not present

## 2015-01-31 DIAGNOSIS — E785 Hyperlipidemia, unspecified: Secondary | ICD-10-CM | POA: Diagnosis not present

## 2015-01-31 LAB — BASIC METABOLIC PANEL
ANION GAP: 6 (ref 5–15)
BUN: 10 mg/dL (ref 6–23)
CHLORIDE: 102 mmol/L (ref 96–112)
CO2: 28 mmol/L (ref 19–32)
Calcium: 8.5 mg/dL (ref 8.4–10.5)
Creatinine, Ser: 1.13 mg/dL (ref 0.50–1.35)
GFR calc Af Amer: 73 mL/min — ABNORMAL LOW (ref >90)
GFR, EST NON AFRICAN AMERICAN: 63 mL/min — AB (ref >90)
Glucose, Bld: 295 mg/dL — ABNORMAL HIGH (ref 70–99)
Potassium: 3.9 mmol/L (ref 3.5–5.1)
SODIUM: 136 mmol/L (ref 135–145)

## 2015-01-31 LAB — CBC
HCT: 34.5 % — ABNORMAL LOW (ref 39.0–52.0)
Hemoglobin: 11.4 g/dL — ABNORMAL LOW (ref 13.0–17.0)
MCH: 28.4 pg (ref 26.0–34.0)
MCHC: 33 g/dL (ref 30.0–36.0)
MCV: 85.8 fL (ref 78.0–100.0)
Platelets: 200 10*3/uL (ref 150–400)
RBC: 4.02 MIL/uL — ABNORMAL LOW (ref 4.22–5.81)
RDW: 13.6 % (ref 11.5–15.5)
WBC: 6.1 10*3/uL (ref 4.0–10.5)

## 2015-01-31 LAB — GLUCOSE, CAPILLARY: Glucose-Capillary: 288 mg/dL — ABNORMAL HIGH (ref 70–99)

## 2015-01-31 MED ORDER — ESOMEPRAZOLE MAGNESIUM 40 MG PO CPDR: 40.0000 mg | DELAYED_RELEASE_CAPSULE | Freq: Every day | ORAL | Status: DC

## 2015-01-31 MED ORDER — TICAGRELOR 90 MG PO TABS: 90.0000 mg | ORAL_TABLET | Freq: Two times a day (BID) | ORAL | Status: DC

## 2015-01-31 MED ORDER — NITROGLYCERIN 0.4 MG SL SUBL
0.4000 mg | SUBLINGUAL_TABLET | SUBLINGUAL | Status: DC | PRN
Start: 2015-01-31 — End: 2016-01-01

## 2015-01-31 MED ORDER — HYDROCHLOROTHIAZIDE 12.5 MG PO TABS: 12.5000 mg | ORAL_TABLET | Freq: Every day | ORAL | Status: DC

## 2015-01-31 MED ORDER — FUROSEMIDE 10 MG/ML IJ SOLN
40.0000 mg | Freq: Once | INTRAMUSCULAR | Status: AC
  Administered 2015-01-31: 40 mg via INTRAVENOUS
  Filled 2015-01-31: qty 4

## 2015-01-31 MED ORDER — ACETAMINOPHEN 325 MG PO TABS: 650.0000 mg | ORAL_TABLET | ORAL | Status: DC | PRN

## 2015-01-31 NOTE — Discharge Instructions (Signed)
Coronary Angiogram With Stent, Care After °Refer to this sheet in the next few weeks. These instructions provide you with information on caring for yourself after your procedure. Your health care provider may also give you more specific instructions. Your treatment has been planned according to current medical practices, but problems sometimes occur. Call your health care provider if you have any problems or questions after your procedure.  °WHAT TO EXPECT AFTER THE PROCEDURE  °The insertion site may be tender for a few days after your procedure. °HOME CARE INSTRUCTIONS  °· Take medicines only as directed by your health care provider. Blood thinners may be prescribed after your procedure to improve blood flow through the stent. °· Change any bandages (dressings) as directed by your health care provider.   °· Check your insertion site every day for redness, swelling, or fluid leaking from the insertion.   °· Do not take baths, swim, or use a hot tub until your health care provider approves. You may shower. Pat the insertion area dry. Do not rub the insertion area with a washcloth or towel.   °· Eat a heart-healthy diet. This should include plenty of fresh fruits and vegetables. Meat should be lean cuts. Avoid the following types of food:   °¨ Food that is high in salt.   °¨ Canned or highly processed food.   °¨ Food that is high in saturated fat or sugar.   °¨ Fried food.   °· Make any other lifestyle changes recommended by your health care provider. This may include:   °¨ Not using any tobacco products including cigarettes, chewing tobacco, or electronic cigarettes.  °¨ Managing your weight.   °¨ Getting regular exercise.   °¨ Managing your blood pressure.   °¨ Limiting your alcohol intake.   °¨ Managing other health problems, such as diabetes.   °· If you need an MRI after your heart stent was placed, be sure to tell the health care provider who orders the MRI that you have a heart stent.   °· Keep all follow-up  visits as directed by your health care provider.   °SEEK IMMEDIATE MEDICAL CARE IF:  °· You develop chest pain, shortness of breath, feel faint, or pass out. °· You have bleeding, swelling larger than a walnut, or drainage from the catheter insertion site. °· You develop pain, discoloration, coldness, or severe bruising in the leg or arm that held the catheter. °· You develop bleeding from any other place such as from the bowels. There may be bright red blood in the urine or stools, or it may appear as black, tarry stools. °· You have a fever or chills. °MAKE SURE YOU: °· Understand these instructions. °· Will watch your condition. °· Will get help right away if you are not doing well or get worse. °Document Released: 05/06/2005 Document Revised: 03/03/2014 Document Reviewed: 03/20/2013 °ExitCare® Patient Information ©2015 [employer]. This information is not intended to replace advice given to you by your health care provider. Make sure you discuss any questions you have with your health care provider. ° °

## 2015-01-31 NOTE — Progress Notes (Signed)
CARDIAC REHAB PHASE I   PRE:  Rate/Rhythm: 85 NSR  BP:  Sitting: 187/69      SaO2: 99 RA  MODE:  Ambulation: 600 ft   POST:  Rate/Rhythm: 88 NSR  BP:  Sitting: 126/35     SaO2: 98 RA  [id number] Patient ambulated in hallway independently. Steady gait noted. No rest breaks taken. Patient stated he was SOB but much improved since stent placement. Ambulatory oxygen sats 96%. Post walk patient to chair with call bell and phone in reach. Discharge education completed with emphlysis on DM management and activity progression. Patient also verbalized importance on compliance with medications including antiplatelet therapy. Stent cards and education handouts given. Patient expressed interest in ambulatory card rehab. With patients permission order placed. Patient to be discharged home this am. Patient did complain of chest discomfort 4/10 at end of education session. Kerin Ransom, [place] notified along with patients primary RN.  Santina Evans, BSN 01/31/2015 8:50 AM

## 2015-01-31 NOTE — Progress Notes (Signed)
    Subjective:  Up with cardiac rehab, no chest pain  Objective:  Vital Signs in the last 24 hours: Temp:  [97.6 F (36.4 C)-98.1 F (36.7 C)] 97.6 F (36.4 C) (04/02 0727) Pulse Rate:  [66-93] 75 (04/02 0727) Resp:  [15-23] 20 (04/02 0727) BP: (127-185)/(48-81) 153/50 mmHg (04/02 0727) SpO2:  [97 %-100 %] 98 % (04/02 0727) Weight:  [289 lb 14.5 oz (131.5 kg)] 289 lb 14.5 oz (131.5 kg) (04/02 0323)  Intake/Output from previous day:  Intake/Output Summary (Last 24 hours) at 01/31/15 0824 Last data filed at 01/31/15 2778  Gross per 24 hour  Intake 1260.18 ml  Output   2300 ml  Net -1039.82 ml    Physical Exam: General appearance: alert, cooperative, no distress and morbidly obese Neck: no carotid bruit and no JVD Lungs: clear to auscultation bilaterally Heart: regular rate and rhythm Extremities: Rt radial site without hematoma, bilat LE edema 1+   Rate: 75  Rhythm: normal sinus rhythm  Lab Results:  Recent Labs  01/31/15 0315  WBC 6.1  HGB 11.4*  PLT 200    Recent Labs  01/31/15 0315  NA 136  K 3.9  CL 102  CO2 28  GLUCOSE 295*  BUN 10  CREATININE 1.13   No results for input(s): TROPONINI in the last 72 hours.  Invalid input(s): CK, MB No results for input(s): INR in the last 72 hours.  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 The pt would like to be re evaluated for sleep apnea. Add diuretic per Dr Tamala Julian, (will give Lasix IV once this am).   Kerin Ransom PA-C Beeper [phone] 01/31/2015, 8:24 AM  History and all data above reviewed.  Patient examined.  I agree with  the findings as above.  No further chest pain.  the pain that he had last night was not like his previous angina.   The patient exam reveals COR:RRR  ,  Lungs: Clear  ,  Abd: Positive bowel sounds, no rebound no guarding, Ext No edema, right wrist OK  .  All available labs, radiology testing, previous records reviewed. Agree with documented assessment and plan. CAD s/p DES.  Home with meds as listed.  Home on low dose diuretic.  SLNTG prescribed. On Brilinta.  He will be doing cardiac rehab.     Jeneen Rinks Corderius Saraceni  8:40 AM  01/31/2015

## 2015-01-31 NOTE — Discharge Summary (Signed)
Patient ID: Edward [name],  MRN: [id number], DOB/AGE: [birth year] 74 y.o.  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 The pt was last studied in March 2015 and had a 50% mid LAD stenosis. He had been having exertional angina symptoms as an OP 01/20/15 showed an anterior apical defect. He was admitted for elective cath 01/30/15. This revealed a significant LAD stenosis that was treated with an LAD DES. He tolerated this well. A diuretic was added at discharge. The pt had normal LVF with elevated LVEDP at cath. He was given one dose of IV Lasix and discharged on HCTZ 12.5 mg daily. At discharge he expressed interest in having another sleep study. He had a sleep study 5 yrs ago but was unable tolerate C-pap. This can scheduled at follow up. He'll also need a BMP at follow up.   Discharge Vitals:  Blood pressure 153/50, pulse 75, temperature 97.6 F (36.4 C), temperature source Oral, resp. rate 20, height '5\' 8"'  (1.727 m), weight 289 lb 14.5 oz (131.5 kg), SpO2 98 %.    Labs: Results for orders placed or performed during the hospital encounter of 01/30/15 (from the past 24 hour(s))  POCT Activated clotting time     Status: None   Collection Time: 01/30/15 10:29 AM  Result Value Ref Range   Activated Clotting Time 656 seconds  Glucose, capillary     Status:  None   Collection Time: 01/30/15 11:08 AM  Result Value Ref Range   Glucose-Capillary 81 70 - 99 mg/dL  Glucose, capillary     Status: Abnormal   Collection Time: 01/30/15  5:09 PM  Result Value Ref Range   Glucose-Capillary 272 (H) 70 - 99 mg/dL  Glucose, capillary     Status: Abnormal   Collection Time: 01/30/15  8:32 PM  Result Value Ref Range   Glucose-Capillary 330 (H) 70 - 99 mg/dL   Comment 1 Notify RN    Comment 2 Document in Chart   Glucose, capillary     Status: Abnormal   Collection Time: 01/30/15 11:35 PM  Result Value Ref Range   Glucose-Capillary 295 (H) 70 - 99 mg/dL  Basic metabolic panel     Status: Abnormal   Collection Time: 01/31/15  3:15 AM  Result Value Ref Range   Sodium 136 135 - 145 mmol/L   Potassium 3.9 3.5 - 5.1 mmol/L   Chloride 102 96 - 112 mmol/L   CO2 28 19 - 32 mmol/L   Glucose, Bld 295 (H) 70 - 99 mg/dL   BUN 10 6 - 23 mg/dL   Creatinine, Ser 1.13 0.50 - 1.35 mg/dL   Calcium 8.5 8.4 - 10.5 mg/dL   GFR calc non Af Amer 63 (L) >90 mL/min   GFR calc Af Amer 73 (L) >90 mL/min   Anion gap 6 5 -  15  CBC     Status: Abnormal   Collection Time: 01/31/15  3:15 AM  Result Value Ref Range   WBC 6.1 4.0 - 10.5 K/uL   RBC 4.02 (L) 4.22 - 5.81 MIL/uL   Hemoglobin 11.4 (L) 13.0 - 17.0 g/dL   HCT 34.5 (L) 39.0 - 52.0 %   MCV 85.8 78.0 - 100.0 fL   MCH 28.4 26.0 - 34.0 pg   MCHC 33.0 30.0 - 36.0 g/dL   RDW 13.6 11.5 - 15.5 %   Platelets 200 150 - 400 K/uL  Glucose, capillary     Status: Abnormal   Collection Time: 01/31/15  6:10 AM  Result Value Ref Range   Glucose-Capillary 288 (H) 70 - 99 mg/dL    Disposition:  Follow-up Information    Follow up with Sinclair Grooms, MD.   Specialty:  Cardiology   Why:  office will call you   Contact information:   [address] [place] [address] [phone]       Discharge Medications:    Medication List    STOP taking these medications        Ciclopirox 1 % shampoo      oxyCODONE-acetaminophen 5-325 MG per tablet  Commonly known as:  ROXICET     pantoprazole 40 MG tablet  Commonly known as:  PROTONIX     propranolol 20 MG tablet  Commonly known as:  INDERAL      TAKE these medications        acetaminophen 325 MG tablet  Commonly known as:  TYLENOL  Take 2 tablets (650 mg total) by mouth every 4 (four) hours as needed for headache or mild pain.     amLODipine 10 MG tablet  Commonly known as:  NORVASC  Take 1 tablet (10 mg total) by mouth daily.     aspirin EC 81 MG tablet  Take 81 mg by mouth daily.     clonazePAM 1 MG tablet  Commonly known as:  KLONOPIN  Take 1 tablet (1 mg total) by mouth at bedtime as needed for anxiety.     cyclobenzaprine 5 MG tablet  Commonly known as:  FLEXERIL  Take 1 tablet (5 mg total) by mouth at bedtime as needed for muscle spasms.     esomeprazole 40 MG capsule  Commonly known as:  NEXIUM  Take 1 capsule (40 mg total) by mouth daily at 12 noon.     glucose blood test strip  Commonly known as:  BL TEST STRIP PACK  Use to test blood sugar 4 times daily as instructed. Dx code: 250.00     hydrochlorothiazide 12.5 MG tablet  Commonly known as:  HYDRODIURIL  Take 1 tablet (12.5 mg total) by mouth daily.     HYDROcodone-acetaminophen 5-325 MG per tablet  Commonly known as:  NORCO  Take 1 tablet by mouth every 6 (six) hours as needed.     insulin regular human CONCENTRATED 500 UNIT/ML Soln injection  Commonly known as:  HUMULIN R  Inject 0.14 mLs (70 Units total) into the skin 3 (three) times daily with meals.     Insulin Syringe-Needle U-100 31G X 15/64" 0.5 ML Misc  Commonly known as:  BD INSULIN SYRINGE ULTRAFINE  Use 3x a day     Insulin Syringes (Disposable) U-100 1 ML Misc  60 Units by Does not apply route 4 (four) times daily.     lisinopril 20 MG tablet  Commonly known as:  PRINIVIL,ZESTRIL  Take 1 tablet (20 mg total) by mouth daily.     metoprolol 50 MG tablet  Commonly known as:   LOPRESSOR  Take 1 tablet (50 mg total) by mouth 2 (two) times daily.     nitroGLYCERIN 0.4 MG SL tablet  Commonly known as:  NITROSTAT  Place 1 tablet (0.4 mg total) under the tongue every 5 (five) minutes as needed for chest pain (CP or SOB).     ONE TOUCH ULTRA SYSTEM KIT W/DEVICE Kit  Use to test blood sugar daily as instructed. Dx code: E11.59     onetouch ultrasoft lancets  Use to test blood sugar 4 times daily as instructed. Dx code: 250.00     pravastatin 20 MG tablet  Commonly known as:  PRAVACHOL  Take 1 tablet (20 mg total) by mouth daily.     ticagrelor 90 MG Tabs tablet  Commonly known as:  BRILINTA  Take 1 tablet (90 mg total) by mouth 2 (two) times daily.         Duration of Discharge Encounter: Greater than 30 minutes including physician time.  Angelena Form PA-C 01/31/2015 9:20 AM

## 2015-02-02 ENCOUNTER — Telehealth: Payer: Self-pay | Admitting: Interventional Cardiology

## 2015-02-02 ENCOUNTER — Encounter: Payer: Self-pay | Admitting: Family Medicine

## 2015-02-02 ENCOUNTER — Telehealth: Payer: Self-pay | Admitting: *Deleted

## 2015-02-02 DIAGNOSIS — I2510 Atherosclerotic heart disease of native coronary artery without angina pectoris: Secondary | ICD-10-CM

## 2015-02-02 DIAGNOSIS — R197 Diarrhea, unspecified: Secondary | ICD-10-CM

## 2015-02-02 MED ORDER — PRASUGREL HCL 10 MG PO TABS: 10.0000 mg | ORAL_TABLET | Freq: Every day | ORAL | Status: DC

## 2015-02-02 MED FILL — Sodium Chloride IV Soln 0.9%: INTRAVENOUS | Qty: 50 | Status: AC

## 2015-02-02 NOTE — Telephone Encounter (Signed)
Does he have C diff? Just left the hospital. Needs to give a stool sample ASAP.

## 2015-02-02 NOTE — Telephone Encounter (Signed)
Does the patient have C. Diff enterocolitis? We assume the brilinta is causing diarrhea.  If diarrhea continues he needs to give a stool sample for r/o C diff.

## 2015-02-02 NOTE — Telephone Encounter (Signed)
Transition Care Management Follow-up Telephone Call   How have you been since you were released from the hospital? Patient reports that he has been doing well, no chest pain or shortness of breath, but he began to have diarrhea on Saturday 01/31/15.  He has called his cardiology office to try to get an appointment to be seen for this issue.     Do you understand why you were in the hospital? YES- " I had a heart attack and I got a stent and I am now on a blood thinner"   Do you understand the discharge instrcutions? YES  Items Reviewed:  Medications reviewed: yes, patient on Brilanta   Allergies reviewed: NKA  Dietary changes reviewed: YES- patient on carb modified, heart healthy diet        Referrals reviewed: YES- patient has called cardiology office to schedule an appt and is awaiting a call back  Functional Questionnaire:   Activities of Daily Living (ADLs):   He states they are independent in the following: ambulating, dressing, bathing, restroom States they require assistance with the following:  Cooking, cleaning, driving (brother is helping)   Any transportation issues/concerns?: YES- he can not drive, but his brother is driving him    Any patient concerns? YES- diarrhea since discharge.     Confirmed importance and date/time of follow-up visits scheduled: Patient has called cardiology office to schedule a follow-up appointment.  Offered patient an appointment 02/06/15 with Dr. Etter Sjogren, but patient stated he wanted to find out when his cardiology appt was first and arrange transportation with his brother.  He states he will call the office back.  Notified patient that it was important for him to be seen quickly regarding both hospitalization and diarrhea, and he states if he does not get an appointment today or tomorrow he will call back.     Confirmed with patient if condition begins to worsen call PCP or go to the ER.  Patient was given the Call-a-Nurse line [phone]:  YES

## 2015-02-02 NOTE — Telephone Encounter (Signed)
New message     Pt c/o medication issue:  1. Name of Medication: brilinta  2. How are you currently taking this medication (dosage and times per day)? 90mg  bid 3. Are you having a reaction (difficulty breathing--STAT)? no  4. What is your medication issue? Started medication recently.  He has diarrhea--so bad that he cannot leave his home.  Could this be from the medication?

## 2015-02-02 NOTE — Telephone Encounter (Signed)
noted 

## 2015-02-02 NOTE — Telephone Encounter (Signed)
FYI-- Called patient to arrange follow-up after hospitalization and heart cath.  Patient declined stating he will arrange with cardiology first and then call back office.  He is having diarrhea (2 times daily) since discharge from hospital.  He states if he can not get cardiology appointment today or tomorrow he will call back office to arrange.    Patient states he is taking both Protonix and Nexium- the Protonix he has not picked up yet.  Should he be taking both of those medications?

## 2015-02-02 NOTE — Telephone Encounter (Signed)
Pt having diarrhea that started after first few doses of Brilinta started end of last week in the hospital and it has not gotten any better.   Pt stated he had at least 7 loose stools last night and wants to know if there is something we can recommend to help it stop.  Spoke with DOD, recommended pt stop the Brilinta and start Effient 10 mg daily.  Pt made aware to take last dose of Brilinta today and start Effient tomorrow (4/5) and to only take once a day.  Pt made aware that 2 weeks of samples were left at office front desk to pick up. Pt provided our office address and hours. Pt stated his brother should be able to come pick them up before the office closes today.  Pt educated to start taking the medication tomorrow and continue for 1 week and call our office if symptoms continue.  If this medication works better for him then call our office and we can provide him with discount card. Pt agreed and no additional questions at this time.

## 2015-02-03 NOTE — Telephone Encounter (Signed)
Pt has not had any more loose stools since speaking yesterday. Pt state he has had stools since yesterday afternoon and both were firm.  Pt started Effient as discussed.   Pt agreed to get stool kit from the [facility] lab to provide samples to rule out C. diff.  Pt stated per MD instructions he cannot start drive until Friday, but he will see if his brother can go by the lab to pick up stool sample kit today.  Order placed for C. Diff culture.   Pt provided [facility] address. Pt agrees with plan and no additional questions a this time. 

## 2015-02-05 ENCOUNTER — Other Ambulatory Visit: Payer: Medicare Other

## 2015-02-05 DIAGNOSIS — R197 Diarrhea, unspecified: Secondary | ICD-10-CM | POA: Diagnosis not present

## 2015-02-06 ENCOUNTER — Encounter: Payer: Self-pay | Admitting: Family Medicine

## 2015-02-06 ENCOUNTER — Other Ambulatory Visit: Payer: Self-pay | Admitting: *Deleted

## 2015-02-06 ENCOUNTER — Telehealth: Payer: Self-pay | Admitting: Interventional Cardiology

## 2015-02-06 NOTE — Telephone Encounter (Signed)
New message      Pt thinks he has a virus----sneezing, diarrhea, upset stomach, vomiting.  Pt had a stent put in last Friday.  This started while he was in the hosp.  Dr Tamala Julian is aware of this.  Pt want an antibiotic

## 2015-02-06 NOTE — Telephone Encounter (Signed)
Patient needs ov.  

## 2015-02-06 NOTE — Telephone Encounter (Signed)
Returned pt call. Pt sts that his diarrhea has returned, he did return his c.diff stool card to the lab yesterday. Adv him that we will await the results and call back when we receive them. Adv him we cannot prescribe antibiotics without knowing what we are treating. Adv him to rest and stay hydrated. We will call him with his lab results asap. Pt verbalized understanding.

## 2015-02-06 NOTE — Telephone Encounter (Signed)
Pt aware of Dr.Smith's recommendations. He should f/u with his pcp asap for non-cardiac symptoms, he can let his pcp know that he has had a c.diff culture and is awaiting the results. Pt verbalized understanding.

## 2015-02-09 ENCOUNTER — Ambulatory Visit (INDEPENDENT_AMBULATORY_CARE_PROVIDER_SITE_OTHER): Payer: Medicare Other | Admitting: Family Medicine

## 2015-02-09 ENCOUNTER — Encounter: Payer: Self-pay | Admitting: Family Medicine

## 2015-02-09 VITALS — BP 140/68 | HR 74 | Temp 97.8°F | Wt 276.0 lb

## 2015-02-09 DIAGNOSIS — I2583 Coronary atherosclerosis due to lipid rich plaque: Secondary | ICD-10-CM

## 2015-02-09 DIAGNOSIS — I2510 Atherosclerotic heart disease of native coronary artery without angina pectoris: Secondary | ICD-10-CM | POA: Diagnosis not present

## 2015-02-09 DIAGNOSIS — R197 Diarrhea, unspecified: Secondary | ICD-10-CM

## 2015-02-09 DIAGNOSIS — E118 Type 2 diabetes mellitus with unspecified complications: Secondary | ICD-10-CM | POA: Diagnosis not present

## 2015-02-09 DIAGNOSIS — Z9861 Coronary angioplasty status: Secondary | ICD-10-CM

## 2015-02-09 DIAGNOSIS — I200 Unstable angina: Secondary | ICD-10-CM | POA: Diagnosis not present

## 2015-02-09 DIAGNOSIS — E785 Hyperlipidemia, unspecified: Secondary | ICD-10-CM

## 2015-02-09 DIAGNOSIS — E119 Type 2 diabetes mellitus without complications: Secondary | ICD-10-CM

## 2015-02-09 LAB — BASIC METABOLIC PANEL
BUN: 18 mg/dL (ref 6–23)
CHLORIDE: 103 meq/L (ref 96–112)
CO2: 29 meq/L (ref 19–32)
Calcium: 9.3 mg/dL (ref 8.4–10.5)
Creatinine, Ser: 1.06 mg/dL (ref 0.40–1.50)
GFR: 88.14 mL/min (ref >60.00)
Glucose, Bld: 257 mg/dL — ABNORMAL HIGH (ref 70–99)
Potassium: 3.7 mEq/L (ref 3.5–5.1)
Sodium: 138 mEq/L (ref 135–145)

## 2015-02-09 LAB — HEPATIC FUNCTION PANEL
ALK PHOS: 115 U/L (ref 39–117)
ALT: 15 U/L (ref 0–53)
AST: 17 U/L (ref 0–37)
Albumin: 3.8 g/dL (ref 3.5–5.2)
BILIRUBIN DIRECT: 0.1 mg/dL (ref 0.0–0.3)
Total Bilirubin: 0.4 mg/dL (ref 0.2–1.2)
Total Protein: 6.9 g/dL (ref 6.0–8.3)

## 2015-02-09 LAB — LIPID PANEL
CHOL/HDL RATIO: 4
Cholesterol: 155 mg/dL (ref 0–200)
HDL: 38 mg/dL — AB (ref >39.00)
LDL CALC: 100 mg/dL — AB (ref 0–99)
NONHDL: 117
Triglycerides: 83 mg/dL (ref 0.0–149.0)
VLDL: 16.6 mg/dL (ref 0.0–40.0)

## 2015-02-09 LAB — HEMOGLOBIN A1C: Hgb A1c MFr Bld: 8.9 % — ABNORMAL HIGH (ref 4.6–6.5)

## 2015-02-09 NOTE — Assessment & Plan Note (Signed)
Per cardiology Check labs F/u 6 months or sooner prn

## 2015-02-09 NOTE — Progress Notes (Signed)
Pre visit review using our clinic review tool, if applicable. No additional management support is needed unless otherwise documented below in the visit note. 

## 2015-02-09 NOTE — Patient Instructions (Signed)

## 2015-02-09 NOTE — Progress Notes (Signed)
Patient ID: Edward [name], male    DOB: 14-Apr-1941  Age: 74 y.o. MRN: [id number]    Subjective:  Subjective HPI SHOUA RESSLER presents for f/u from hospital..  He did have abd cramping and diarrhea ---- but that has since resolved.  The c diff cultures are still pending.    Review of Systems  Constitutional: Negative.   HENT: Negative for congestion, ear pain, hearing loss, nosebleeds, postnasal drip, rhinorrhea, sinus pressure, sneezing and tinnitus.   Eyes: Negative for photophobia, discharge, itching and visual disturbance.  Respiratory: Negative.   Cardiovascular: Negative.   Gastrointestinal: Negative for abdominal pain, constipation, blood in stool, abdominal distention and anal bleeding.  Endocrine: Negative.   Genitourinary: Negative.   Musculoskeletal: Negative.   Skin: Negative.   Allergic/Immunologic: Negative.   Neurological: Negative for dizziness, weakness, light-headedness, numbness and headaches.  Psychiatric/Behavioral: Negative for suicidal ideas, confusion, sleep disturbance, dysphoric mood, decreased concentration and agitation. The patient is not nervous/anxious.     History Past Medical History  Diagnosis Date  . Hypertension   . Arthritis   . CAD (coronary artery disease)     a. Pt reports "small heart attack" in 2013 at [facility], denies need for stent or CABG, does not know findings of cath.  Marland Kitchen TIA (transient ischemic attack) 2002  . Dyslipidemia, goal LDL below 70 01/29/2014  . Heart attack 02/03/2014    "mild heart attack"  . GERD (gastroesophageal reflux disease)   . Enlarged prostate   . Shortness of breath     exertion  . Sleep apnea     does not use cpap  . Pneumonia 01/2013  . Pneumonia 03/2014    hospitalized  . DM type 2 (diabetes mellitus, type 2), insulin dependent 01/29/2014    fasting cbg 50-120 with new regimen  . Anxiety   . Headache(784.0)     He has past surgical history that includes Total knee arthroplasty  (Bilateral); Cardiac catheterization (01/28/14); Lumbar disc surgery; Cataract extraction (Bilateral); Joint replacement (Bilateral); Back surgery; Eye surgery (Bilateral); Shoulder arthroscopy (Right, 07/30/2014); left heart catheterization with coronary angiogram (N/A, 01/28/2014); Coronary stent placement (01/30/2015); and left heart catheterization with coronary angiogram (N/A, 01/30/2015).   His family history includes Alzheimer's disease in his sister; CAD in his brother and sister; Diabetes in his other; Prostate cancer in his brother.He reports that he has never smoked. He has never used smokeless tobacco. He reports that he does not drink alcohol or use illicit drugs.  Current Outpatient Prescriptions on File Prior to Visit  Medication Sig Dispense Refill  . acetaminophen (TYLENOL) 325 MG tablet Take 2 tablets (650 mg total) by mouth every 4 (four) hours as needed for headache or mild pain.    Marland Kitchen amLODipine (NORVASC) 10 MG tablet Take 1 tablet (10 mg total) by mouth daily. 30 tablet 11  . aspirin EC 81 MG tablet Take 81 mg by mouth daily.    . B-D INS SYR ULTRAFINE 1CC/30G 30G X 1/2" 1 ML MISC   0  . B-D INS SYR ULTRAFINE 1CC/31G 31G X 5/16" 1 ML MISC   0  . Blood Glucose Monitoring Suppl (ONE TOUCH ULTRA SYSTEM KIT) W/DEVICE KIT Use to test blood sugar daily as instructed. Dx code: E11.59 1 each 0  . Ciclopirox 1 % shampoo Apply 1 application topically 3 (three) times a week.  0  . clonazePAM (KLONOPIN) 1 MG tablet Take 1 tablet (1 mg total) by mouth at bedtime as needed for anxiety.  30 tablet 0  . cyclobenzaprine (FLEXERIL) 5 MG tablet Take 1 tablet (5 mg total) by mouth at bedtime as needed for muscle spasms. 30 tablet 3  . esomeprazole (NEXIUM) 40 MG capsule Take 1 capsule (40 mg total) by mouth daily at 12 noon. 30 capsule 11  . glucose blood (BL TEST STRIP PACK) test strip Use to test blood sugar 4 times daily as instructed. Dx code: 250.00 200 each 4  . hydrochlorothiazide (HYDRODIURIL)  12.5 MG tablet Take 1 tablet (12.5 mg total) by mouth daily. 30 tablet 11  . HYDROcodone-acetaminophen (NORCO) 5-325 MG per tablet Take 1 tablet by mouth every 6 (six) hours as needed. 60 tablet 0  . insulin regular human CONCENTRATED (HUMULIN R) 500 UNIT/ML SOLN injection Inject 0.14 mLs (70 Units total) into the skin 3 (three) times daily with meals. 20 mL 1  . Insulin Syringe-Needle U-100 (BD INSULIN SYRINGE ULTRAFINE) 31G X 15/64" 0.5 ML MISC Use 3x a day 100 each 3  . Insulin Syringes, Disposable, U-100 1 ML MISC 60 Units by Does not apply route 4 (four) times daily. 120 each 2  . Lancets (ONETOUCH ULTRASOFT) lancets Use to test blood sugar 4 times daily as instructed. Dx code: 250.00 200 each 4  . lisinopril (PRINIVIL,ZESTRIL) 20 MG tablet Take 1 tablet (20 mg total) by mouth daily. 30 tablet 6  . metoprolol (LOPRESSOR) 50 MG tablet Take 1 tablet (50 mg total) by mouth 2 (two) times daily. 60 tablet 6  . nitroGLYCERIN (NITROSTAT) 0.4 MG SL tablet Place 1 tablet (0.4 mg total) under the tongue every 5 (five) minutes as needed for chest pain (CP or SOB). 25 tablet 4  . NOVOLIN R 100 UNIT/ML injection   0  . NOVOLOG 100 UNIT/ML injection Inject 20 Units into the skin 3 (three) times daily. Patient is taking 20 units with breakfast, 40 units at lunch, and 40 units at dinner  1  . NOVOLOG MIX 70/30 (70-30) 100 UNIT/ML injection Inject 40 Units into the skin 3 (three) times daily. Takes 30 Units with breakfast, 30 units with lunch, and 60 units at bedtime  0  . oxyCODONE-acetaminophen (PERCOCET/ROXICET) 5-325 MG per tablet Take 1 tablet by mouth every 12 (twelve) hours as needed.  0  . pantoprazole (PROTONIX) 40 MG tablet Take 1 tablet by mouth daily at 6 PM.  1  . pravastatin (PRAVACHOL) 20 MG tablet Take 1 tablet (20 mg total) by mouth daily. 30 tablet 2  . propranolol (INDERAL) 20 MG tablet Take 1 tablet by mouth daily. Given by Dr. Posey Pronto for tremors  0  . prasugrel (EFFIENT) 10 MG TABS tablet  Take 1 tablet (10 mg total) by mouth daily. (Patient not taking: Reported on 02/09/2015) 90 tablet 3   No current facility-administered medications on file prior to visit.     Objective:  Objective Physical Exam  Constitutional: He appears well-developed and well-nourished. No distress.  Neck: Normal range of motion. Neck supple.  Cardiovascular: Normal rate, regular rhythm and normal heart sounds.   Pulmonary/Chest: Effort normal and breath sounds normal. No respiratory distress. He has no wheezes. He has no rales. He exhibits no tenderness.  Psychiatric: He has a normal mood and affect. His behavior is normal. Judgment and thought content normal.   BP 140/68 mmHg  Pulse 74  Temp(Src) 97.8 F (36.6 C) (Oral)  Wt 276 lb (125.193 kg)  SpO2 97% Wt Readings from Last 3 Encounters:  02/09/15 276 lb (125.193 kg)  01/31/15  289 lb 14.5 oz (131.5 kg)  01/19/15 277 lb (125.646 kg)     Lab Results  Component Value Date   WBC 6.1 01/31/2015   HGB 11.4* 01/31/2015   HCT 34.5* 01/31/2015   PLT 200 01/31/2015   GLUCOSE 295* 01/31/2015   CHOL 170 11/20/2014   TRIG 130.0 11/20/2014   HDL 37.10* 11/20/2014   LDLCALC 107* 11/20/2014   ALT 15 11/20/2014   AST 15 11/20/2014   NA 136 01/31/2015   K 3.9 01/31/2015   CL 102 01/31/2015   CREATININE 1.13 01/31/2015   BUN 10 01/31/2015   CO2 28 01/31/2015   TSH 1.041 12/02/2014   PSA 0.74 02/11/2014   INR 0.9 01/27/2015   HGBA1C 11.1* 12/02/2014   MICROALBUR 91.9* 02/11/2014    No results found.   Assessment & Plan:  Plan I am having Mr. Habib maintain his aspirin EC, metoprolol, glucose blood, onetouch ultrasoft, Insulin Syringes (Disposable), lisinopril, HYDROcodone-acetaminophen, clonazePAM, pravastatin, Insulin Syringe-Needle U-100, amLODipine, ONE TOUCH ULTRA SYSTEM KIT, insulin regular human CONCENTRATED, cyclobenzaprine, acetaminophen, nitroGLYCERIN, hydrochlorothiazide, esomeprazole, propranolol, Ciclopirox, NOVOLOG MIX 70/30,  NOVOLOG, oxyCODONE-acetaminophen, pantoprazole, prasugrel, NOVOLIN R, B-D INS SYR ULTRAFINE 1CC/31G, and B-D INS SYR ULTRAFINE 1CC/30G.  No orders of the defined types were placed in this encounter.    Problem List Items Addressed This Visit    CAD S/P LAD DES 01/30/15    Per cardiology Check labs F/u 6 months or sooner prn      Diarrhea    Symptoms have resolved c diff pending rto if symptoms reoccur      DM type 2 (diabetes mellitus, type 2), insulin dependent (Chronic)    Per endo       Other Visit Diagnoses    Hyperlipidemia    -  Primary    Relevant Orders    Basic metabolic panel    Hepatic function panel    Lipid panel    Coronary artery disease due to lipid rich plaque        Relevant Orders    Basic metabolic panel    Hepatic function panel    Lipid panel    Diabetes mellitus type II, controlled        Relevant Orders    Hemoglobin A1c       Follow-up: Return in about 6 months (around 08/11/2015), or if symptoms worsen or fail to improve, for f/u and labs.  Garnet Koyanagi, DO

## 2015-02-09 NOTE — Assessment & Plan Note (Signed)
Per endo °

## 2015-02-09 NOTE — Assessment & Plan Note (Signed)
Symptoms have resolved c diff pending rto if symptoms reoccur

## 2015-02-10 LAB — CLOSTRIDIUM DIFFICILE CULTURE-FECAL

## 2015-02-12 ENCOUNTER — Other Ambulatory Visit: Payer: Self-pay

## 2015-02-12 MED ORDER — PRAVASTATIN SODIUM 40 MG PO TABS: 40.0000 mg | ORAL_TABLET | Freq: Every day | ORAL | Status: DC

## 2015-02-16 ENCOUNTER — Ambulatory Visit (INDEPENDENT_AMBULATORY_CARE_PROVIDER_SITE_OTHER): Payer: Medicare Other | Admitting: Ophthalmology

## 2015-02-17 ENCOUNTER — Ambulatory Visit: Payer: Medicare Other | Admitting: Internal Medicine

## 2015-02-23 ENCOUNTER — Encounter: Payer: Federal, State, Local not specified - PPO | Admitting: Nurse Practitioner

## 2015-02-26 ENCOUNTER — Telehealth: Payer: Self-pay

## 2015-02-26 ENCOUNTER — Other Ambulatory Visit: Payer: Self-pay

## 2015-02-26 MED ORDER — TICAGRELOR 90 MG PO TABS: 90.0000 mg | ORAL_TABLET | Freq: Two times a day (BID) | ORAL | Status: DC

## 2015-02-26 NOTE — Telephone Encounter (Signed)
Patient called for samples of brilinta placed samples up front

## 2015-02-27 ENCOUNTER — Ambulatory Visit (INDEPENDENT_AMBULATORY_CARE_PROVIDER_SITE_OTHER): Payer: Medicare Other | Admitting: Ophthalmology

## 2015-02-27 ENCOUNTER — Other Ambulatory Visit: Payer: Self-pay

## 2015-02-27 DIAGNOSIS — E11311 Type 2 diabetes mellitus with unspecified diabetic retinopathy with macular edema: Secondary | ICD-10-CM

## 2015-02-27 DIAGNOSIS — E11351 Type 2 diabetes mellitus with proliferative diabetic retinopathy with macular edema: Secondary | ICD-10-CM

## 2015-02-27 DIAGNOSIS — E11339 Type 2 diabetes mellitus with moderate nonproliferative diabetic retinopathy without macular edema: Secondary | ICD-10-CM | POA: Diagnosis not present

## 2015-02-27 DIAGNOSIS — I10 Essential (primary) hypertension: Secondary | ICD-10-CM

## 2015-02-27 DIAGNOSIS — H43813 Vitreous degeneration, bilateral: Secondary | ICD-10-CM | POA: Diagnosis not present

## 2015-02-27 DIAGNOSIS — H35033 Hypertensive retinopathy, bilateral: Secondary | ICD-10-CM | POA: Diagnosis not present

## 2015-02-27 MED ORDER — TICAGRELOR 90 MG PO TABS: 90.0000 mg | ORAL_TABLET | Freq: Two times a day (BID) | ORAL | Status: DC

## 2015-03-03 ENCOUNTER — Encounter: Payer: Self-pay | Admitting: Family Medicine

## 2015-03-03 ENCOUNTER — Telehealth: Payer: Self-pay | Admitting: Internal Medicine

## 2015-03-03 NOTE — Telephone Encounter (Signed)
If we have any he can have it- he may want to check with his endo too.

## 2015-03-03 NOTE — Telephone Encounter (Signed)
That's too bad, his HbA1c greatly improved on the U500. We can go back to the previous doses: Insulin  Before breakfast  Before lunch  Before dinner  Bedtime  Novolin 70/30  40  40 40  If sugars >200; take 20 units  Novolin 30 30 30           He should check sugars more frequently after the change, since insulin sensitivity can greatly improve with U500, and he may require less insulin now.  Please let us know how his sugars are doing in a week.

## 2015-03-03 NOTE — Telephone Encounter (Signed)
Returned pt's call. Pt stated that the U500 went up from $45 to $139. He is unable to afford the increase. He would like to go back on Novolog and the Novolog 70/30 mix. Please advise if ok and dosages. Thank you.

## 2015-03-03 NOTE — Telephone Encounter (Signed)
Patient called stating that his medication is too expensive  He would like to know what the cheapest way for him to get his medication   Please advise   Thank you

## 2015-03-04 MED ORDER — INSULIN ASPART 100 UNIT/ML ~~LOC~~ SOLN: SUBCUTANEOUS | Status: DC

## 2015-03-04 MED ORDER — INSULIN ASPART PROT & ASPART (70-30 MIX) 100 UNIT/ML ~~LOC~~ SUSP
SUBCUTANEOUS | Status: DC
Start: 2015-03-04 — End: 2015-03-05

## 2015-03-04 NOTE — Telephone Encounter (Signed)
Sent Novolog and Novolog 70/30 per Dr Cruzita Lederer.

## 2015-03-05 ENCOUNTER — Telehealth: Payer: Self-pay | Admitting: Internal Medicine

## 2015-03-05 ENCOUNTER — Other Ambulatory Visit: Payer: Self-pay | Admitting: *Deleted

## 2015-03-05 MED ORDER — INSULIN REGULAR HUMAN (CONC) 500 UNIT/ML ~~LOC~~ SOLN: SUBCUTANEOUS | Status: DC

## 2015-03-05 NOTE — Telephone Encounter (Signed)
Patient need to talk to as soon as possible.

## 2015-03-05 NOTE — Telephone Encounter (Signed)
Called Edward [name] back and advised him that Dr Cruzita Lederer has spoken with the [employer] rep and she has coupons for him to get a free months worth of Humulin R U-500 kwikpen and a coupon for $25 a month for a year. Edward [name] very pleased and will pick up the coupon tomorrow AM. We will send a new rx to Edward [name]'s pharmacy.

## 2015-03-05 NOTE — Telephone Encounter (Signed)
Per Dr Cruzita Lederer.

## 2015-03-09 ENCOUNTER — Encounter: Payer: Federal, State, Local not specified - PPO | Admitting: Physician Assistant

## 2015-03-17 ENCOUNTER — Encounter: Payer: Self-pay | Admitting: Internal Medicine

## 2015-03-17 ENCOUNTER — Other Ambulatory Visit (INDEPENDENT_AMBULATORY_CARE_PROVIDER_SITE_OTHER): Payer: Medicare Other | Admitting: *Deleted

## 2015-03-17 ENCOUNTER — Ambulatory Visit (INDEPENDENT_AMBULATORY_CARE_PROVIDER_SITE_OTHER): Payer: Medicare Other | Admitting: Internal Medicine

## 2015-03-17 VITALS — BP 148/72 | HR 95 | Temp 98.4°F | Resp 12 | Wt 274.8 lb

## 2015-03-17 DIAGNOSIS — I200 Unstable angina: Secondary | ICD-10-CM | POA: Diagnosis not present

## 2015-03-17 DIAGNOSIS — E119 Type 2 diabetes mellitus without complications: Secondary | ICD-10-CM

## 2015-03-17 DIAGNOSIS — E118 Type 2 diabetes mellitus with unspecified complications: Secondary | ICD-10-CM | POA: Diagnosis not present

## 2015-03-17 LAB — POCT GLUCOSE (DEVICE FOR HOME USE): POC Glucose: 443 mg/dl — AB (ref 70–99)

## 2015-03-17 MED ORDER — INSULIN REGULAR HUMAN (CONC) 500 UNIT/ML ~~LOC~~ SOLN: 60.0000 [IU] | Freq: Three times a day (TID) | SUBCUTANEOUS | Status: DC

## 2015-03-17 MED ORDER — INSULIN PEN NEEDLE 31G X 5 MM MISC: Status: DC

## 2015-03-17 NOTE — Patient Instructions (Signed)
Please switch to the following regimen, until you get the U500:  Insulin  Before breakfast  Before lunch  Before dinner  Bedtime  Humalog 70/30  40  40 40  If sugars >200;  take 20 units  Humalog 30 30 30    Inject the insulin 15 min before meals.  When you get the U500, please stop Humalog and Humalog 70/30 and start U500 insulin - please inject 12 units (0.12 mL) 30 min before each meal. If sugars still high after you start this, can increase to 15 units before each meals.  Please return in July.

## 2015-03-17 NOTE — Progress Notes (Signed)
Patient ID: Edward [name], male   DOB: 02-21-1941, 74 y.o.   MRN: [id number]  HPI: [name] is a 74 y.o.-year-old male, returning for f/u for DM2,  dx 1987, insulin-dependent since 1989, uncontrolled, with complications (CAD, PN). He moved from [place] in 12/2013. Last visit 2.5 mo ago.  Last hemoglobin A1c was:  Lab Results  Component Value Date   HGBA1C 8.9* 02/09/2015   HGBA1C 11.1* 12/02/2014   HGBA1C 10.2* 05/27/2014   He was on: - Levemir 75 >> 90 units at bedtime. - NovoLog: - 30 >> 40 units with breakfast - 20 >> 30  units with lunch - 30 >> 40 units with dinner But he felt that this regimen was not working. He was on Metformin >> N/V/D.  We then switched to:  We tried to switch to U500 insulin - could not get it as it was 400$ >> given him coupons: - 12-14 units before each meal Could not afford it. Started:  Insulin  Before breakfast  Before lunch  Before dinner  Bedtime  Novolin 70/30  40  40 40  If sugars >200;  take 20 units  Novolin 30 30 30     He did not take insulin 70/30 this am (ran out) >> sugars in the office 440.   Reviewed email exchange since last visit: Received email from patient 01/13/2015:  Good Morning Dr. Cruzita Lederer:  I am happy to share with you that the RU- 500 is really working, I have only experience low sugar level below 95 only 3 times.  My energy level is really great. The pharmacy at [place] would like for you to send a new prescription saying I am only to use 14 units three times a day, on the last prescription you had 60 units.  Again thank you and have a great day.  [name]   Will scan the sugar log - there really fluctuating between 105 and 240 mostly. He is apparently taking just 12 units of insulin if the sugars are above 140, and 14 units 20 minutes before a meal otherwise.  Replied today:  Dear [name],  Sorry for the delay in my answer! I do not check my email quite every day.  The sugars are a little higher than at last  visit...  I am not sure I understand exactly how you're usig the U500, especially since you mentioned that if the sugar level is above 140, you only take 12 units. Otherwise, you take 14 units.  I also saw that you're taking the insulin 20 minutes before a meal. We can try to move it ~30 minutes before a meal, if you can do that.  I think for now, I would stay with 14 units before each meal, but we might need to adjust the dose depending on the pre-meal sugars in the future. Please do not decrease the dose to 12 units if the sugars are >140. We will send the insulin prescription to [facility] ASAP.  Please let me know again about if sugars in another week or so. Sincerely  [name]  Pt checks his sugars 4-5 a day - 200s. Reviewed sugars from before: - am: 180-270 >> 100-331 (436) >> 128-344 >> 52, 60, 79-339 >> 90-120s - 2h after b'fast: n/c >> 200 >> 78 x1, 209-337 >> 128-263, 380 >> 170-180 - before lunch: 280-310 >> 216, 294, 402 >> 179-370 >> 68, 178-299, 319  >> 130s - 2h after lunch: n/c >>  222, 375, 545 >> 198-309 >> 121-254 >> 170-180s - before dinner: 280s >> 177 >> 67 x1, 175-312 >> 302, 395 >> 170s - 2h after dinner: n/c >> 301-379>> 99, 128, 368 >> n/c - bedtime: 200-300 >> 190-463 >> 46, 284 >> <200 - nighttime: n/c >> 300s >> 139-339 Lowest sugar was 100 - he has hypoglycemia awareness at 90  Highest sugar was 500s.  Has a One Touch Ultra meter.   Pt's meals are: - Breakfast: eggs, oatmeal, grits, bacon - Lunch: baked chiken + vegetables - Dinner: same as lunch - Snacks: sweets - cookies Rarely fast food. He gave up sodas. He still drink fruit juice.  - mild CKD, last BUN/creatinine:  Lab Results  Component Value Date   BUN 18 02/09/2015   CREATININE 1.06 02/09/2015  On Lisinopril. ACR 37.6 in 02/11/2014) - Has HL. Last set of lipids: Lab Results  Component Value Date   CHOL 155 02/09/2015   HDL 38.00* 02/09/2015   LDLCALC 100* 02/09/2015   TRIG 83.0  02/09/2015   CHOLHDL 4 02/09/2015  Not on a statin. He gets leg cramps from Lipitor >> cannot tolerate it. - last eye exam was in 10/2014 (Dr Zigmund Daniel). Has DR. Had cataracts sx.  - + numbness and tingling in his feet. He saw podiatrist (02/2014) >> has PN. On ASA 81.  I reviewed pt's medications, allergies, PMH, social hx, family hx, and changes were documented in the history of present illness. Otherwise, unchanged from my initial visit note.  He had surgery for his R shoulder in 07/30/2014.   ROS: Constitutional: no weight gain, + fatigue, no hot flushes, + poor sleep Eyes: no blurry vision, no xerophthalmia ENT: no sore throat, no nodules palpated in throat, no dysphagia/odynophagia, no hoarseness Cardiovascular: no CP/SOB/no palpitations/no leg swelling Respiratory: no cough/SOB/wheezing Gastrointestinal: no N/V/D/+C/no heartburn Musculoskeletal: + muscle aches/no joint aches  Skin: no rash Neurological: + tremors/no numbness/tingling/dizziness  PE: BP 148/72 mmHg  Pulse 95  Temp(Src) 98.4 F (36.9 C) (Oral)  Resp 12  Wt 274 lb 12.8 oz (124.648 kg)  SpO2 98% Wt Readings from Last 3 Encounters:  03/17/15 274 lb 12.8 oz (124.648 kg)  02/09/15 276 lb (125.193 kg)  01/31/15 289 lb 14.5 oz (131.5 kg)   Constitutional: obese, in NAD Eyes: PERRLA, EOMI, no exophthalmos ENT: moist mucous membranes, no thyromegaly, no cervical lymphadenopathy Cardiovascular: RRR, No MRG, + B Ankle swelling Respiratory: CTA B Gastrointestinal: abdomen soft, NT, ND, BS+ Musculoskeletal: no deformities, strength intact in all 4 Skin: moist, warm, no rashes Neurological: no tremor with outstretched hands, DTR normal in all 4  ASSESSMENT: 1. DM2, insulin-dependent, uncontrolled, with complications - CAD H/o steroid inj's  PLAN:  1. Patient with long-standing, uncontrolled diabetes, on premixed + fast acting insulin regimen. We tried to start U500 insulin, and this worked great, with  significant decrease in HbA1c and feeling much better, but he could not get it because this was too expensive. He used a coupon for 1 month free of this concentrated insulin, and also we gave him the application for [employer] assistance program. He did not qualify for this. We called the [employer] rep during the appt >> we will try to get this for him and hopefully he will qualify for assistance... He absolutely needs the U500! Leonia Reader, the diabetes educator, is helping [place] with this. - I suggested to:  Patient Instructions   Please switch to the following regimen, until you get the U500:  Insulin  Before breakfast  Before lunch  Before dinner  Bedtime  Humalog 70/30  40  40 40  If sugars >200;  take 20 units  Humalog 30 30 30    Inject the insulin 15 min before meals.  When you get the U500, please stop Humalog and Humalog 70/30 and start U500 insulin - please inject 12 units (0.12 mL) 30 min before each meal. If sugars still high after you start this, can increase to 15 units before each meals.  Please return in July.    - I did advise him that if he starts a U500 pen, he needs to take 60 units 3x a day.  - continue checking sugars at different times of the day - check 3-4 times a day, rotating checks - up to date with yearly eye exams  - sugar in the office 443 >> given an inj of Humalog 75/25 40 units in the office. I advised him to go directly home. - Return to clinic in 2 mo with sugar log   - time spent with the patient: 25 min, of which >50% was spent in discussing with pt, diabetes educator, and [employer] rep trying to arrange for him to be able to get the treatment that he needs and coordinating his care.

## 2015-03-21 ENCOUNTER — Emergency Department (HOSPITAL_COMMUNITY): Payer: Medicare Other

## 2015-03-21 ENCOUNTER — Emergency Department (HOSPITAL_COMMUNITY)
Admission: EM | Admit: 2015-03-21 | Discharge: 2015-03-21 | Disposition: A | Discharge status: Home / Self Care | Payer: Medicare Other | Attending: Emergency Medicine | Admitting: Emergency Medicine

## 2015-03-21 ENCOUNTER — Emergency Department (HOSPITAL_BASED_OUTPATIENT_CLINIC_OR_DEPARTMENT_OTHER)
Admit: 2015-03-21 | Discharge: 2015-03-21 | Disposition: A | Discharge status: Home / Self Care | Payer: Medicare Other | Attending: Emergency Medicine | Admitting: Emergency Medicine

## 2015-03-21 ENCOUNTER — Encounter (HOSPITAL_COMMUNITY): Payer: Self-pay | Admitting: Emergency Medicine

## 2015-03-21 ENCOUNTER — Other Ambulatory Visit (HOSPITAL_COMMUNITY): Payer: Self-pay

## 2015-03-21 DIAGNOSIS — I2510 Atherosclerotic heart disease of native coronary artery without angina pectoris: Secondary | ICD-10-CM | POA: Diagnosis not present

## 2015-03-21 DIAGNOSIS — Z9861 Coronary angioplasty status: Secondary | ICD-10-CM | POA: Insufficient documentation

## 2015-03-21 DIAGNOSIS — I252 Old myocardial infarction: Secondary | ICD-10-CM | POA: Insufficient documentation

## 2015-03-21 DIAGNOSIS — M7989 Other specified soft tissue disorders: Secondary | ICD-10-CM | POA: Diagnosis not present

## 2015-03-21 DIAGNOSIS — R531 Weakness: Secondary | ICD-10-CM | POA: Diagnosis not present

## 2015-03-21 DIAGNOSIS — Z8701 Personal history of pneumonia (recurrent): Secondary | ICD-10-CM | POA: Insufficient documentation

## 2015-03-21 DIAGNOSIS — M79652 Pain in left thigh: Secondary | ICD-10-CM | POA: Diagnosis not present

## 2015-03-21 DIAGNOSIS — Z7982 Long term (current) use of aspirin: Secondary | ICD-10-CM | POA: Insufficient documentation

## 2015-03-21 DIAGNOSIS — R079 Chest pain, unspecified: Secondary | ICD-10-CM | POA: Diagnosis not present

## 2015-03-21 DIAGNOSIS — M79609 Pain in unspecified limb: Secondary | ICD-10-CM | POA: Diagnosis not present

## 2015-03-21 DIAGNOSIS — Z8673 Personal history of transient ischemic attack (TIA), and cerebral infarction without residual deficits: Secondary | ICD-10-CM | POA: Insufficient documentation

## 2015-03-21 DIAGNOSIS — Z87448 Personal history of other diseases of urinary system: Secondary | ICD-10-CM | POA: Diagnosis not present

## 2015-03-21 DIAGNOSIS — M25562 Pain in left knee: Secondary | ICD-10-CM | POA: Diagnosis not present

## 2015-03-21 DIAGNOSIS — E119 Type 2 diabetes mellitus without complications: Secondary | ICD-10-CM | POA: Insufficient documentation

## 2015-03-21 DIAGNOSIS — R0602 Shortness of breath: Secondary | ICD-10-CM | POA: Diagnosis not present

## 2015-03-21 DIAGNOSIS — Z794 Long term (current) use of insulin: Secondary | ICD-10-CM | POA: Insufficient documentation

## 2015-03-21 DIAGNOSIS — R0789 Other chest pain: Secondary | ICD-10-CM | POA: Diagnosis not present

## 2015-03-21 DIAGNOSIS — Z79899 Other long term (current) drug therapy: Secondary | ICD-10-CM | POA: Insufficient documentation

## 2015-03-21 DIAGNOSIS — E785 Hyperlipidemia, unspecified: Secondary | ICD-10-CM | POA: Insufficient documentation

## 2015-03-21 DIAGNOSIS — Z9889 Other specified postprocedural states: Secondary | ICD-10-CM | POA: Diagnosis not present

## 2015-03-21 DIAGNOSIS — F419 Anxiety disorder, unspecified: Secondary | ICD-10-CM | POA: Diagnosis not present

## 2015-03-21 DIAGNOSIS — K219 Gastro-esophageal reflux disease without esophagitis: Secondary | ICD-10-CM | POA: Insufficient documentation

## 2015-03-21 DIAGNOSIS — M1990 Unspecified osteoarthritis, unspecified site: Secondary | ICD-10-CM | POA: Diagnosis not present

## 2015-03-21 DIAGNOSIS — J9811 Atelectasis: Secondary | ICD-10-CM | POA: Diagnosis not present

## 2015-03-21 LAB — BASIC METABOLIC PANEL
Anion gap: 8 (ref 5–15)
BUN: 13 mg/dL (ref 6–20)
CALCIUM: 8.8 mg/dL — AB (ref 8.9–10.3)
CO2: 27 mmol/L (ref 22–32)
Chloride: 104 mmol/L (ref 101–111)
Creatinine, Ser: 1.11 mg/dL (ref 0.61–1.24)
GFR calc non Af Amer: >60 mL/min (ref >60)
Glucose, Bld: 269 mg/dL — ABNORMAL HIGH (ref 65–99)
POTASSIUM: 4.1 mmol/L (ref 3.5–5.1)
SODIUM: 139 mmol/L (ref 135–145)

## 2015-03-21 LAB — CBC
HCT: 37.2 % — ABNORMAL LOW (ref 39.0–52.0)
Hemoglobin: 12.2 g/dL — ABNORMAL LOW (ref 13.0–17.0)
MCH: 27.7 pg (ref 26.0–34.0)
MCHC: 32.8 g/dL (ref 30.0–36.0)
MCV: 84.5 fL (ref 78.0–100.0)
Platelets: 212 10*3/uL (ref 150–400)
RBC: 4.4 MIL/uL (ref 4.22–5.81)
RDW: 13.4 % (ref 11.5–15.5)
WBC: 5.9 10*3/uL (ref 4.0–10.5)

## 2015-03-21 LAB — TROPONIN I: Troponin I: <0.03 ng/mL (ref <0.031)

## 2015-03-21 LAB — D-DIMER, QUANTITATIVE: D-Dimer, Quant: 0.34 ug/mL-FEU (ref 0.00–0.48)

## 2015-03-21 LAB — BRAIN NATRIURETIC PEPTIDE: B NATRIURETIC PEPTIDE 5: 13.8 pg/mL (ref 0.0–100.0)

## 2015-03-21 MED ORDER — NITROGLYCERIN 0.4 MG SL SUBL: 0.4000 mg | SUBLINGUAL_TABLET | SUBLINGUAL | Status: DC | PRN

## 2015-03-21 MED ORDER — MORPHINE SULFATE 4 MG/ML IJ SOLN
8.0000 mg | Freq: Once | INTRAMUSCULAR | Status: AC
  Administered 2015-03-21: 8 mg via INTRAVENOUS
  Filled 2015-03-21: qty 2

## 2015-03-21 NOTE — ED Notes (Signed)
Pt arrives via [facility] for sharp, mid chest pain that began around 0700 this am. Pt had two cardiac stents placed in April. Pt took 1 SL nitro at 1430 with no relief, was given 324mg  asa by fire dept and 2 SL nitro by ems prior to arrival. Pt now rates pain 6/10 down from 7/10. Pt also reported some sob. resp e/u, nad. Pt alert, oriented.

## 2015-03-21 NOTE — Consult Note (Signed)
Referring Physician: Dr. Regenia Skeeter Primary Physician: Primary Cardiologist: Reason for Consultation: "chest pain"   HPI: 74 yrs old AAM with pmh of HTN, hLP CAD s/p PCI of his LAD in April 2016 with DES on guideline directed medical therapy came to ED with c/o chest tightness that started this am at 7 also not feeling well and tired and complaining of left knee pain. He was worried about the chest pain and blood clot so came to ed. Initial blood workup and ekg is normal He is tender in left parasternal area  dvt ruled out by ED   Review of Systems:     Cardiac Review of Systems: {Y] = yes '[ ]'  = no  Chest Pain Blue.Reese    ]  Resting SOB [   ] Exertional SOB  [  ]  Orthopnea [  ]   Pedal Edema [   ]    Palpitations [  ] Syncope  [  ]   Presyncope [   ]  General Review of Systems: [Y] = yes [  ]=no Constitional: recent weight change [  ]; anorexia [  ]; fatigue Blue.Reese  ]; nausea [  ]; night sweats [  ]; fever [  ]; or chills [  ];                                                                     Eyes : blurred vision [  ]; diplopia [   ]; vision changes [  ];  Amaurosis fugax[  ]; Resp: cough [ y ];  wheezing[  ];  hemoptysis[  ];  PND [  ];  GI:  gallstones[  ], vomiting[  ];  dysphagia[  ]; melena[  ];  hematochezia [  ]; heartburn[  ];   GU: kidney stones [  ]; hematuria[  ];   dysuria [  ];  nocturia[  ]; incontinence [  ];             Skin: rash, swelling[  ];, hair loss[  ];  peripheral edema[  ];  or itching[  ]; Musculosketetal: myalgias[  ];  joint swelling[  ];  joint erythema[  ];  joint pain[y  ];  back pain[  ];  Heme/Lymph: bruising[  ];  bleeding[  ];  anemia[  ];  Neuro: TIA[  ];  headaches[  ];  stroke[  ];  vertigo[  ];  seizures[  ];   paresthesias[  ];  difficulty walking[  ];  Psych:depression[  ]; anxiety[  ];  Endocrine: diabetes[  ];  thyroid dysfunction[  ];  y  Past Medical History  Diagnosis Date  . Hypertension   . Arthritis   . CAD (coronary artery disease)       a. Pt reports "small heart attack" in 2013 at [facility], denies need for stent or CABG, does not know findings of cath.  Marland Kitchen TIA (transient ischemic attack) 2002  . Dyslipidemia, goal LDL below 70 01/29/2014  . Heart attack 02/03/2014    "mild heart attack"  . GERD (gastroesophageal reflux disease)   . Enlarged prostate   . Shortness of breath     exertion  . Sleep apnea  does not use cpap  . Pneumonia 01/2013  . Pneumonia 03/2014    hospitalized  . DM type 2 (diabetes mellitus, type 2), insulin dependent 01/29/2014    fasting cbg 50-120 with new regimen  . Anxiety   . Headache(784.0)      (Not in a hospital admission)      No current facility-administered medications on file prior to encounter.   Current Outpatient Prescriptions on File Prior to Encounter  Medication Sig Dispense Refill  . amLODipine (NORVASC) 10 MG tablet Take 1 tablet (10 mg total) by mouth daily. 30 tablet 11  . aspirin EC 81 MG tablet Take 81 mg by mouth daily.    . Ciclopirox 1 % shampoo Apply 1 application topically every other day. At night after showering  0  . clonazePAM (KLONOPIN) 1 MG tablet Take 1 tablet (1 mg total) by mouth at bedtime as needed for anxiety. 30 tablet 0  . esomeprazole (NEXIUM) 40 MG capsule Take 1 capsule (40 mg total) by mouth daily at 12 noon. 30 capsule 11  . hydrochlorothiazide (HYDRODIURIL) 12.5 MG tablet Take 1 tablet (12.5 mg total) by mouth daily. 30 tablet 11  . metoprolol (LOPRESSOR) 50 MG tablet Take 1 tablet (50 mg total) by mouth 2 (two) times daily. (Patient taking differently: Take 50 mg by mouth daily. ) 60 tablet 6  . nitroGLYCERIN (NITROSTAT) 0.4 MG SL tablet Place 1 tablet (0.4 mg total) under the tongue every 5 (five) minutes as needed for chest pain (CP or SOB). 25 tablet 4  . pravastatin (PRAVACHOL) 40 MG tablet Take 1 tablet (40 mg total) by mouth daily. 30 tablet 5  . propranolol (INDERAL) 20 MG tablet Take 20 mg by mouth daily. Given by Dr.  Posey Pronto for tremors  0  . ticagrelor (BRILINTA) 90 MG TABS tablet Take 1 tablet (90 mg total) by mouth 2 (two) times daily. 180 tablet 3  . acetaminophen (TYLENOL) 325 MG tablet Take 2 tablets (650 mg total) by mouth every 4 (four) hours as needed for headache or mild pain. (Patient not taking: Reported on 03/21/2015)    . B-D INS SYR ULTRAFINE 1CC/30G 30G X 1/2" 1 ML MISC   0  . B-D INS SYR ULTRAFINE 1CC/31G 31G X 5/16" 1 ML MISC   0  . Blood Glucose Monitoring Suppl (ONE TOUCH ULTRA SYSTEM KIT) W/DEVICE KIT Use to test blood sugar daily as instructed. Dx code: E11.59 1 each 0  . cyclobenzaprine (FLEXERIL) 5 MG tablet Take 1 tablet (5 mg total) by mouth at bedtime as needed for muscle spasms. (Patient not taking: Reported on 03/21/2015) 30 tablet 3  . glucose blood (BL TEST STRIP PACK) test strip Use to test blood sugar 4 times daily as instructed. Dx code: 250.00 200 each 4  . HYDROcodone-acetaminophen (NORCO) 5-325 MG per tablet Take 1 tablet by mouth every 6 (six) hours as needed. (Patient not taking: Reported on 03/21/2015) 60 tablet 0  . Insulin Pen Needle 31G X 5 MM MISC Use 3x a day 100 each 11  . insulin regular human CONCENTRATED (HUMULIN R U-500 KWIKPEN) 500 UNIT/ML SOLN injection Inject 0.12 mLs (60 Units total) into the skin 3 (three) times daily with meals. (Patient not taking: Reported on 03/21/2015) 12 mL 11  . Insulin Syringe-Needle U-100 (BD INSULIN SYRINGE ULTRAFINE) 31G X 15/64" 0.5 ML MISC Use 3x a day 100 each 3  . Insulin Syringes, Disposable, U-100 1 ML MISC 60 Units by Does not apply route  4 (four) times daily. 120 each 2  . Lancets (ONETOUCH ULTRASOFT) lancets Use to test blood sugar 4 times daily as instructed. Dx code: 250.00 200 each 4  . lisinopril (PRINIVIL,ZESTRIL) 20 MG tablet Take 1 tablet (20 mg total) by mouth daily. (Patient not taking: Reported on 03/21/2015) 30 tablet 6  . prasugrel (EFFIENT) 10 MG TABS tablet Take 1 tablet (10 mg total) by mouth daily. (Patient not  taking: Reported on 03/21/2015) 90 tablet 3     Infusions:    No Known Allergies  History   Social History  . Marital Status: Married    Spouse Name: N/A  . Number of Children: 0  . Years of Education: N/A   Occupational History  . retired    Social History Main Topics  . Smoking status: Never Smoker   . Smokeless tobacco: Never Used  . Alcohol Use: No  . Drug Use: No  . Sexual Activity: Not on file   Other Topics Concern  . 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   He lives alone.  His wife is living in [place] at this time.  They do not have children.   Highest level of education:  College graduate    Family History  Problem Relation Age of Onset  . CAD Brother     2 brothers - CABG  . Alzheimer's disease Sister   . CAD Sister   . Prostate cancer Brother   . Diabetes Other     entire family    PHYSICAL EXAM: Filed Vitals:   03/21/15 2015  BP: 138/61  Pulse: 77  Temp:   Resp: 22    No intake or output data in the 24 hours ending 03/21/15 2023  General:  Well appearing. No respiratory difficulty HEENT: normal Neck: supple. no JVD. Carotids 2+ bilat; no bruits. No lymphadenopathy or thryomegaly appreciated. Cor: PMI nondisplaced. Regular rate & rhythm. No rubs, gallops or murmurs. Lungs: clear, left parasternal area tenderness + Abdomen: soft, nontender, nondistended. No hepatosplenomegaly. No bruits or masses. Good bowel sounds. Extremities: no cyanosis, clubbing, rash, edema Neuro: alert & oriented x 3, cranial nerves grossly intact. moves all 4 extremities w/o difficulty. Affect pleasant.  ECG:  Results for orders placed or performed during the hospital encounter of 03/21/15 (from the past 24 hour(s))  CBC     Status: Abnormal   Collection Time: 03/21/15  4:46 PM  Result Value Ref Range   WBC 5.9 4.0 - 10.5 K/uL   RBC 4.40 4.22 - 5.81 MIL/uL    Hemoglobin 12.2 (L) 13.0 - 17.0 g/dL   HCT 37.2 (L) 39.0 - 52.0 %   MCV 84.5 78.0 - 100.0 fL   MCH 27.7 26.0 - 34.0 pg   MCHC 32.8 30.0 - 36.0 g/dL   RDW 13.4 11.5 - 15.5 %   Platelets 212 150 - 400 K/uL  Basic metabolic panel     Status: Abnormal   Collection Time: 03/21/15  4:46 PM  Result Value Ref Range   Sodium 139 135 - 145 mmol/L   Potassium 4.1 3.5 - 5.1 mmol/L   Chloride 104 101 - 111 mmol/L   CO2 27 22 - 32 mmol/L   Glucose, Bld 269 (H) 65 - 99 mg/dL   BUN 13 6 - 20 mg/dL   Creatinine, Ser 1.11 0.61 - 1.24 mg/dL   Calcium  8.8 (L) 8.9 - 10.3 mg/dL   GFR calc non Af Amer >60 >60 mL/min   GFR calc Af Amer >60 >60 mL/min   Anion gap 8 5 - 15  Troponin I     Status: None   Collection Time: 03/21/15  4:46 PM  Result Value Ref Range   Troponin I <0.03 <0.031 ng/mL  BNP (order ONLY if patient complains of dyspnea/SOB AND you have documented it for THIS visit)     Status: None   Collection Time: 03/21/15  4:48 PM  Result Value Ref Range   B Natriuretic Peptide 13.8 0.0 - 100.0 pg/mL  D-dimer, quantitative     Status: None   Collection Time: 03/21/15  4:48 PM  Result Value Ref Range   D-Dimer, Quant 0.34 0.00 - 0.48 ug/mL-FEU   Dg Chest Port 1 View  03/21/2015   CLINICAL DATA:  Patient with sub sternal chest pain.  EXAM: PORTABLE CHEST - 1 VIEW  COMPARISON:  Chest radiograph 05/07/2014  FINDINGS: Low lung volumes. Stable enlarged cardiac and mediastinal contours. No consolidative pulmonary opacities. Probable minimal bibasilar atelectasis. Degenerative changes bilateral shoulder joints.  IMPRESSION: Low lung volumes with bibasilar atelectasis.   Electronically Signed   By: Lovey Newcomer M.D.   On: 03/21/2015 17:26     ASSESSMENT: Chest tightness atypical and non cardiac in nature EKG and trop negative   PLAN/DISCUSSION: 1. Cycle trop and if negative no further workup and follow up with Dr. Tamala Julian in office 2. Continue guideline directed medical therapy , bb, dapt ,statins

## 2015-03-21 NOTE — ED Provider Notes (Signed)
CSN: [id number]     Arrival date & time 03/21/15  1622 History   First MD Initiated Contact with Patient 03/21/15 1625     Chief Complaint  Patient presents with  . Chest Pain     (Consider location/radiation/quality/duration/timing/severity/associated sxs/prior Treatment) HPI  74 year old male with coronary disease with 2 stents placed in April 2016 presents with chest pressure that started this morning. The patient states he's been feeling weak and having exertional shortness of breath for the last 2-3 days but today developed a pressure around 7 AM. Has been intermittent. Took nitroglycerin and was given nitroglycerin by EMS with a reduction in his pain from 7-5. Patient states this is nothing like the elephant he had on his chest one month ago when he had to go the Cath Lab. The patient has been complaining of some left knee pain and weakness is worried about a blood clot. Feels like his leg is somewhat swollen.  Past Medical History  Diagnosis Date  . Hypertension   . Arthritis   . CAD (coronary artery disease)     a. Pt reports "small heart attack" in 2013 at [facility], denies need for stent or CABG, does not know findings of cath.  Marland Kitchen TIA (transient ischemic attack) 2002  . Dyslipidemia, goal LDL below 70 01/29/2014  . Heart attack 02/03/2014    "mild heart attack"  . GERD (gastroesophageal reflux disease)   . Enlarged prostate   . Shortness of breath     exertion  . Sleep apnea     does not use cpap  . Pneumonia 01/2013  . Pneumonia 03/2014    hospitalized  . DM type 2 (diabetes mellitus, type 2), insulin dependent 01/29/2014    fasting cbg 50-120 with new regimen  . Anxiety   . HYIFOYDX(412.8)    Past Surgical History  Procedure Laterality Date  . Total knee arthroplasty Bilateral   . Cardiac catheterization  01/28/14    + CAD treat medically  . Lumbar disc surgery    . Cataract extraction Bilateral   . Joint replacement Bilateral     knee   . Back  surgery    . Eye surgery Bilateral   . Shoulder arthroscopy Right 07/30/2014    Procedure: Right Shoulder Arthroscopy, Debridement, Decompression, Manipulation Under Anesthesia;  Surgeon: Newt Minion, MD;  Location: [place];  Service: Orthopedics;  Laterality: Right;  . Left heart catheterization with coronary angiogram N/A 01/28/2014    Procedure: LEFT HEART CATHETERIZATION WITH CORONARY ANGIOGRAM;  Surgeon: Sinclair Grooms, MD;  Location: [facility] CATH LAB;  Service: Cardiovascular;  Laterality: N/A;  . Coronary stent placement  01/30/2015    DES Promus  Premier to LAD by Dr Tamala Julian  . Left heart catheterization with coronary angiogram N/A 01/30/2015    Procedure: LEFT HEART CATHETERIZATION WITH CORONARY ANGIOGRAM;  Surgeon: Belva Crome, MD;  Location: [facility] C[facility] LAB;  Service: Cardiovascular;  Laterality: N/A;   Family History  Problem Relation Age of Onset  . CAD Brother     2 brothers - CABG  . Alzheimer's disease Sister   . CAD Sister   . Prostate cancer Brother   . Diabetes Other     entire family   History  Substance Use Topics  . Smoking status: Never Smoker   . Smokeless tobacco: Never Used  . Alcohol Use: No    Review of Systems  Constitutional: Positive for fatigue. Negative for fever.  Respiratory: Positive for shortness of  breath.   Cardiovascular: Positive for chest pain and leg swelling.  Gastrointestinal: Negative for abdominal pain.  Musculoskeletal: Positive for arthralgias.  Neurological: Positive for weakness.  All other systems reviewed and are negative.     Allergies  Review of patient's allergies indicates no known allergies.  Home Medications   Prior to Admission medications   Medication Sig Start Date End Date Taking? Authorizing Provider  acetaminophen (TYLENOL) 325 MG tablet Take 2 tablets (650 mg total) by mouth every 4 (four) hours as needed for headache or mild pain. 01/31/15   Erlene Quan, PA-C  amLODipine (NORVASC) 10 MG tablet Take 1 tablet (10  mg total) by mouth daily. 12/29/14   Rosalita Chessman, DO  aspirin EC 81 MG tablet Take 81 mg by mouth daily.    Historical Provider, MD  B-D INS SYR ULTRAFINE 1CC/30G 30G X 1/2" 1 ML MISC  11/01/14   Historical Provider, MD  B-D INS SYR ULTRAFINE 1CC/31G 31G X 5/16" 1 ML MISC  01/06/15   Historical Provider, MD  Blood Glucose Monitoring Suppl (ONE TOUCH ULTRA SYSTEM KIT) W/DEVICE KIT Use to test blood sugar daily as instructed. Dx code: E11.59 12/30/14   Philemon Kingdom, MD  Ciclopirox 1 % shampoo Apply 1 application topically 3 (three) times a week. 11/20/14   Historical Provider, MD  clonazePAM (KLONOPIN) 1 MG tablet Take 1 tablet (1 mg total) by mouth at bedtime as needed for anxiety. 11/19/14   Rosalita Chessman, DO  cyclobenzaprine (FLEXERIL) 5 MG tablet Take 1 tablet (5 mg total) by mouth at bedtime as needed for muscle spasms. 01/15/15   Donika Keith Rake, DO  esomeprazole (NEXIUM) 40 MG capsule Take 1 capsule (40 mg total) by mouth daily at 12 noon. 01/31/15   Erlene Quan, PA-C  glucose blood (BL TEST STRIP PACK) test strip Use to test blood sugar 4 times daily as instructed. Dx code: 250.00 02/21/14   Philemon Kingdom, MD  hydrochlorothiazide (HYDRODIURIL) 12.5 MG tablet Take 1 tablet (12.5 mg total) by mouth daily. 01/31/15   Erlene Quan, PA-C  HYDROcodone-acetaminophen (NORCO) 5-325 MG per tablet Take 1 tablet by mouth every 6 (six) hours as needed. 07/31/14   Newt Minion, MD  Insulin Pen Needle 31G X 5 MM MISC Use 3x a day 03/17/15   Philemon Kingdom, MD  insulin regular human CONCENTRATED (HUMULIN R U-500 KWIKPEN) 500 UNIT/ML SOLN injection Inject 0.12 mLs (60 Units total) into the skin 3 (three) times daily with meals. 03/17/15   Philemon Kingdom, MD  Insulin Syringe-Needle U-100 (BD INSULIN SYRINGE ULTRAFINE) 31G X 15/64" 0.5 ML MISC Use 3x a day 12/22/14   Philemon Kingdom, MD  Insulin Syringes, Disposable, U-100 1 ML MISC 60 Units by Does not apply route 4 (four) times daily. 06/03/14   Philemon Kingdom,  MD  Lancets [facility] ULTRASOFT) lancets Use to test blood sugar 4 times daily as instructed. Dx code: 250.00 02/21/14   Philemon Kingdom, MD  lisinopril (PRINIVIL,ZESTRIL) 20 MG tablet Take 1 tablet (20 mg total) by mouth daily. 07/17/14   Rosalita Chessman, DO  metoprolol (LOPRESSOR) 50 MG tablet Take 1 tablet (50 mg total) by mouth 2 (two) times daily. 01/29/14   Isaiah Serge, NP  nitroGLYCERIN (NITROSTAT) 0.4 MG SL tablet Place 1 tablet (0.4 mg total) under the tongue every 5 (five) minutes as needed for chest pain (CP or SOB). 01/31/15   Erlene Quan, PA-C  oxyCODONE-acetaminophen (PERCOCET/ROXICET) 5-325 MG per tablet  Take 1 tablet by mouth every 12 (twelve) hours as needed. 12/26/14   Historical Provider, MD  pantoprazole (PROTONIX) 40 MG tablet Take 1 tablet by mouth daily at 6 PM. 12/29/14   Historical Provider, MD  prasugrel (EFFIENT) 10 MG TABS tablet Take 1 tablet (10 mg total) by mouth daily. 02/02/15   Dorothy Spark, MD  pravastatin (PRAVACHOL) 40 MG tablet Take 1 tablet (40 mg total) by mouth daily. 02/12/15   Rosalita Chessman, DO  propranolol (INDERAL) 20 MG tablet Take 1 tablet by mouth daily. Given by Dr. Posey Pronto for tremors 12/26/14   Historical Provider, MD  ticagrelor (BRILINTA) 90 MG TABS tablet Take 1 tablet (90 mg total) by mouth 2 (two) times daily. 02/27/15   Belva Crome, MD   There were no vitals taken for this visit. Physical Exam  Constitutional: He is oriented to person, place, and time. He appears well-developed and well-nourished.  HENT:  Head: Normocephalic and atraumatic.  Right Ear: External ear normal.  Left Ear: External ear normal.  Nose: Nose normal.  Eyes: Right eye exhibits no discharge. Left eye exhibits no discharge.  Neck: Neck supple.  Cardiovascular: Normal rate, regular rhythm, normal heart sounds and intact distal pulses.   Pulses:      Dorsalis pedis pulses are 2+ on the left side.  Pulmonary/Chest: Effort normal.  Abdominal: Soft. There is no  tenderness.  Musculoskeletal: He exhibits no edema.       Left knee: He exhibits normal range of motion and no swelling. Tenderness (Posteriorly) found.       Left upper leg: He exhibits tenderness. He exhibits no swelling.       Left lower leg: He exhibits no tenderness, no bony tenderness and no swelling.  Neurological: He is alert and oriented to person, place, and time.  Skin: Skin is warm and dry.  Nursing note and vitals reviewed.   ED Course  Procedures (including critical care time) Labs Review Labs Reviewed  CBC - Abnormal; Notable for the following:    Hemoglobin 12.2 (*)    HCT 37.2 (*)    All other components within normal limits  BASIC METABOLIC PANEL - Abnormal; Notable for the following:    Glucose, Bld 269 (*)    Calcium 8.8 (*)    All other components within normal limits  BRAIN NATRIURETIC PEPTIDE  TROPONIN I  D-DIMER, QUANTITATIVE  TROPONIN I    Imaging Review Dg Chest Port 1 View  03/21/2015   CLINICAL DATA:  Patient with sub sternal chest pain.  EXAM: PORTABLE CHEST - 1 VIEW  COMPARISON:  Chest radiograph 05/07/2014  FINDINGS: Low lung volumes. Stable enlarged cardiac and mediastinal contours. No consolidative pulmonary opacities. Probable minimal bibasilar atelectasis. Degenerative changes bilateral shoulder joints.  IMPRESSION: Low lung volumes with bibasilar atelectasis.   Electronically Signed   By: Lovey Newcomer M.D.   On: 03/21/2015 17:26    Author: Iantha Fallen, RVS Service: Vascular Lab Author Type: Cardiovascular Sonographer   Filed: 03/21/2015 6:04 PM Note Time: 03/21/2015 6:04 PM Status: Signed   Editor: Iantha Fallen, RVS (Cardiovascular Sonographer)     Expand All Collapse All   VASCULAR LAB PRELIMINARY PRELIMINARY PRELIMINARY PRELIMINARY  Left lower extremity venous Doppler completed.   Preliminary report: There is no DVT or SVT noted in the left lower extremity.  KANADY, CANDACE, RVT 03/21/2015, 6:04 PM       EKG  Interpretation None     ED ECG REPORT  Date: 03/21/2015  Rate: 79  Rhythm: normal sinus rhythm  QRS Axis: normal  Intervals: PR prolonged  ST/T Wave abnormalities: normal  Conduction Disutrbances:first-degree A-V block   Narrative Interpretation:   Old EKG Reviewed: unchanged  I have personally reviewed the EKG tracing and agree with the computerized printout as noted.  MDM   Final diagnoses:  Chest pain, unspecified chest pain type    Patient with atypical chest pain, states this is different than the pain that caused him to receive his coronary stents. Patient feels better after treatment in the ER, minimal change with multiple nitroglycerin at home and by EMS. Given his subjective feeling of swelling and pain to his left lower extremity and ultrasound was obtained and is negative for DVT. With that and a negative d-dimer I have low suspicion for pulmonary embolism causing his symptoms. His pain has been intermittent all day, initial troponin and EKG are unremarkable. Discussed with cardiology, Dr. Levin Erp the fellow has a value with the patient and wants a second troponin and then feels patient is stable enough for discharge. Discussed with patient and he is comfortable going home and will follow up with cardiology as scheduled.    Sherwood Gambler, MD 03/22/15 [phone]

## 2015-03-21 NOTE — ED Notes (Signed)
Vascular Tech called back & is aware of order.

## 2015-03-21 NOTE — Progress Notes (Signed)
VASCULAR LAB PRELIMINARY  PRELIMINARY  PRELIMINARY  PRELIMINARY  Left lower extremity venous Doppler completed.    Preliminary report:  There is no DVT or SVT noted in the left lower extremity.  Saadiya Wilfong, RVT 03/21/2015, 6:04 PM

## 2015-03-21 NOTE — ED Notes (Signed)
Pt placed on O2 for comfort, states he feels like he needs some O2 for his sob. Pts O2 sat 100% prior to being placed on O2

## 2015-03-21 NOTE — Discharge Instructions (Signed)

## 2015-03-24 DIAGNOSIS — M545 Low back pain: Secondary | ICD-10-CM | POA: Diagnosis not present

## 2015-03-24 DIAGNOSIS — M25562 Pain in left knee: Secondary | ICD-10-CM | POA: Diagnosis not present

## 2015-03-24 DIAGNOSIS — M7501 Adhesive capsulitis of right shoulder: Secondary | ICD-10-CM | POA: Diagnosis not present

## 2015-03-24 DIAGNOSIS — M75121 Complete rotator cuff tear or rupture of right shoulder, not specified as traumatic: Secondary | ICD-10-CM | POA: Diagnosis not present

## 2015-04-02 ENCOUNTER — Encounter: Payer: Self-pay | Admitting: Family Medicine

## 2015-04-02 DIAGNOSIS — F419 Anxiety disorder, unspecified: Secondary | ICD-10-CM

## 2015-04-02 MED ORDER — CLONAZEPAM 1 MG PO TABS
1.0000 mg | ORAL_TABLET | Freq: Every evening | ORAL | Status: DC | PRN
Start: 2015-04-02 — End: 2015-08-31

## 2015-04-02 NOTE — Telephone Encounter (Signed)
Ok to refill clonazapam x1

## 2015-04-02 NOTE — Telephone Encounter (Signed)
Klonopin requested.  Last seen 02/09/15 and filled 11/19/14 #30   Please advise    KP

## 2015-04-06 ENCOUNTER — Ambulatory Visit (INDEPENDENT_AMBULATORY_CARE_PROVIDER_SITE_OTHER): Payer: Medicare Other | Admitting: Physician Assistant

## 2015-04-06 ENCOUNTER — Encounter: Payer: Self-pay | Admitting: Physician Assistant

## 2015-04-06 VITALS — BP 180/78 | HR 83 | Ht 68.0 in | Wt 279.8 lb

## 2015-04-06 DIAGNOSIS — Z9861 Coronary angioplasty status: Secondary | ICD-10-CM | POA: Diagnosis not present

## 2015-04-06 DIAGNOSIS — I200 Unstable angina: Secondary | ICD-10-CM

## 2015-04-06 DIAGNOSIS — I2510 Atherosclerotic heart disease of native coronary artery without angina pectoris: Secondary | ICD-10-CM

## 2015-04-06 DIAGNOSIS — E785 Hyperlipidemia, unspecified: Secondary | ICD-10-CM

## 2015-04-06 DIAGNOSIS — I10 Essential (primary) hypertension: Secondary | ICD-10-CM | POA: Diagnosis not present

## 2015-04-06 DIAGNOSIS — R079 Chest pain, unspecified: Secondary | ICD-10-CM

## 2015-04-06 MED ORDER — METOPROLOL TARTRATE 50 MG PO TABS: 50.0000 mg | ORAL_TABLET | Freq: Two times a day (BID) | ORAL | Status: DC

## 2015-04-06 MED ORDER — LISINOPRIL 20 MG PO TABS: 20.0000 mg | ORAL_TABLET | Freq: Every day | ORAL | Status: DC

## 2015-04-06 NOTE — Assessment & Plan Note (Signed)
-  Continue Pravachol ?

## 2015-04-06 NOTE — Patient Instructions (Addendum)
Medication Instructions:   TAKE TWO HCTZ TABLETS TODAY   REFILLS FOR LISINOPRIL  AND METOPROLOL SENT TO [facility] [address]   Labwork:   Testing/Procedures:   Follow-Up:  DR Tamala Julian IN 3 MONTHS    Any Other Special Instructions Will Be Listed Below (If Applicable).  Low-Sodium Eating Plan Sodium raises blood pressure and causes water to be held in the body. Getting less sodium from food will help lower your blood pressure, reduce any swelling, and protect your heart, liver, and kidneys. We get sodium by adding salt (sodium chloride) to food. Most of our sodium comes from canned, boxed, and frozen foods. Restaurant foods, fast foods, and pizza are also very high in sodium. Even if you take medicine to lower your blood pressure or to reduce fluid in your body, getting less sodium from your food is important. WHAT IS MY PLAN? Most people should limit their sodium intake to 2,300 mg a day. Your health care provider recommends that you limit your sodium intake to __________ a day.  WHAT DO I NEED TO KNOW ABOUT THIS EATING PLAN? For the low-sodium eating plan, you will follow these general guidelines:  Choose foods with a % Daily Value for sodium of less than 5% (as listed on the food label).   Use salt-free seasonings or herbs instead of table salt or sea salt.   Check with your health care provider or pharmacist before using salt substitutes.   Eat fresh foods.  Eat more vegetables and fruits.  Limit canned vegetables. If you do use them, rinse them well to decrease the sodium.   Limit cheese to 1 oz (28 g) per day.   Eat lower-sodium products, often labeled as "lower sodium" or "no salt added."  Avoid foods that contain monosodium glutamate (MSG). MSG is sometimes added to [place] food and some canned foods.  Check food labels (Nutrition Facts labels) on foods to learn how much sodium is in one serving.  Eat more home-cooked food and less restaurant, buffet, and  fast food.  When eating at a restaurant, ask that your food be prepared with less salt or none, if possible.  HOW DO I READ FOOD LABELS FOR SODIUM INFORMATION? The Nutrition Facts label lists the amount of sodium in one serving of the food. If you eat more than one serving, you must multiply the listed amount of sodium by the number of servings. Food labels may also identify foods as:  Sodium free--Less than 5 mg in a serving.  Very low sodium--35 mg or less in a serving.  Low sodium--140 mg or less in a serving.  Light in sodium--50% less sodium in a serving. For example, if a food that usually has 300 mg of sodium is changed to become light in sodium, it will have 150 mg of sodium.  Reduced sodium--25% less sodium in a serving. For example, if a food that usually has 400 mg of sodium is changed to reduced sodium, it will have 300 mg of sodium. WHAT FOODS CAN I EAT? Grains Low-sodium cereals, including oats, puffed wheat and rice, and shredded wheat cereals. Low-sodium crackers. Unsalted rice and pasta. Lower-sodium bread.  Vegetables Frozen or fresh vegetables. Low-sodium or reduced-sodium canned vegetables. Low-sodium or reduced-sodium tomato sauce and paste. Low-sodium or reduced-sodium tomato and vegetable juices.  Fruits Fresh, frozen, and canned fruit. Fruit juice.  Meat and Other Protein Products Low-sodium canned tuna and salmon. Fresh or frozen meat, poultry, seafood, and fish. Lamb. Unsalted nuts. Dried beans,  peas, and lentils without added salt. Unsalted canned beans. Homemade soups without salt. Eggs.  Dairy Milk. Soy milk. Ricotta cheese. Low-sodium or reduced-sodium cheeses. Yogurt.  Condiments Fresh and dried herbs and spices. Salt-free seasonings. Onion and garlic powders. Low-sodium varieties of mustard and ketchup. Lemon juice.  Fats and Oils Reduced-sodium salad dressings. Unsalted butter.  Other Unsalted popcorn and pretzels.  The items listed above  may not be a complete list of recommended foods or beverages. Contact your dietitian for more options. WHAT FOODS ARE NOT RECOMMENDED? Grains Instant hot cereals. Bread stuffing, pancake, and biscuit mixes. Croutons. Seasoned rice or pasta mixes. Noodle soup cups. Boxed or frozen macaroni and cheese. Self-rising flour. Regular salted crackers. Vegetables Regular canned vegetables. Regular canned tomato sauce and paste. Regular tomato and vegetable juices. Frozen vegetables in sauces. Salted french fries. Olives. Angie Fava. Relishes. Sauerkraut. Salsa. Meat and Other Protein Products Salted, canned, smoked, spiced, or pickled meats, seafood, or fish. Bacon, ham, sausage, hot dogs, corned beef, chipped beef, and packaged luncheon meats. Salt pork. Jerky. Pickled herring. Anchovies, regular canned tuna, and sardines. Salted nuts. Dairy Processed cheese and cheese spreads. Cheese curds. Blue cheese and cottage cheese. Buttermilk.  Condiments Onion and garlic salt, seasoned salt, table salt, and sea salt. Canned and packaged gravies. Worcestershire sauce. Tartar sauce. Barbecue sauce. Teriyaki sauce. Soy sauce, including reduced sodium. Steak sauce. Fish sauce. Oyster sauce. Cocktail sauce. Horseradish. Regular ketchup and mustard. Meat flavorings and tenderizers. Bouillon cubes. Hot sauce. Tabasco sauce. Marinades. Taco seasonings. Relishes. Fats and Oils Regular salad dressings. Salted butter. Margarine. Ghee. Bacon fat.  Other Potato and tortilla chips. Corn chips and puffs. Salted popcorn and pretzels. Canned or dried soups. Pizza. Frozen entrees and pot pies.  The items listed above may not be a complete list of foods and beverages to avoid. Contact your dietitian for more information. Document Released: 04/08/2002 Document Revised: 10/22/2013 Document Reviewed: 08/21/2013 [facility] Patient Information 2015 [place], [place]. This information is not intended to replace advice given to you by  your health care provider. Make sure you discuss any questions you have with your health care provider.

## 2015-04-06 NOTE — Assessment & Plan Note (Signed)
Patient's blood pressure is elevated today. He did not take his Lopressor and lisinopril and has not gotten his prescriptions filled. He also didn't take his HCTZ this morning. Recommend he take HCTZ 25 mg when he gets home. We have given him new prescriptions for lisinopril and metoprolol and asked him to start these. He is to check his blood pressure home and call us with results. 2 g sodium diet. Follow-up with Dr. Tamala Julian in 2-3 months.

## 2015-04-06 NOTE — Progress Notes (Signed)
Cardiology Office Note   Date:  04/06/2015   ID:  GARFIELD COINER, DOB 16-Jun-1941, MRN [id number]  PCP:  Garnet Koyanagi, DO  Cardiologist: Daneen Schick, M.D.  Chief Complaint: chest pain    History of Present Illness: NTHONY LEFFERTS is a 74 y.o. male who presents for post hospital and emergency room follow-up. He underwent drug-eluting stent to the LAD on 01/30/15 after an abnormal nuclear stress test showed anterior apical partially reversible defect. Patient had normal LV function with elevated LVEDP at cath. He was given 1 dose of IV Lasix and discharged home on HCTZ 12.5 mg daily. He went  to the emergency room 03/21/15 with chest pain and leg pain. Chest pain was atypical and MI was ruled out. Dopplers were negative for DVT. D-dimer was negative troponins negative EKG unchanged.  Patient comes in today for follow-up. He canceled his initial appointment. His blood pressure is quite high. He is not taking Lopressor or lisinopril. He isn't sure what they're for. He did not take his HCTZ this morning. He does eat a lot of salt. He denies any further chest pain, palpitations, dyspnea, dizziness or presyncope. He has occasional dyspnea on exertion.   Past Medical History  Diagnosis Date  . Hypertension   . Arthritis   . CAD (coronary artery disease)     a. Pt reports "small heart attack" in 2013 at [facility], denies need for stent or CABG, does not know findings of cath.  Marland Kitchen TIA (transient ischemic attack) 2002  . Dyslipidemia, goal LDL below 70 01/29/2014  . Heart attack 02/03/2014    "mild heart attack"  . GERD (gastroesophageal reflux disease)   . Enlarged prostate   . Shortness of breath     exertion  . Sleep apnea     does not use cpap  . Pneumonia 01/2013  . Pneumonia 03/2014    hospitalized  . DM type 2 (diabetes mellitus, type 2), insulin dependent 01/29/2014    fasting cbg 50-120 with new regimen  . Anxiety   . LXBWIOMB(559.7)     Past Surgical History  Procedure  Laterality Date  . Total knee arthroplasty Bilateral   . Cardiac catheterization  01/28/14    + CAD treat medically  . Lumbar disc surgery    . Cataract extraction Bilateral   . Joint replacement Bilateral     knee   . Back surgery    . Eye surgery Bilateral   . Shoulder arthroscopy Right 07/30/2014    Procedure: Right Shoulder Arthroscopy, Debridement, Decompression, Manipulation Under Anesthesia;  Surgeon: Newt Minion, MD;  Location: [facility];  Service: Orthopedics;  Laterality: Right;  . Left heart catheterization with coronary angiogram N/A 01/28/2014    Procedure: LEFT HEART CATHETERIZATION WITH CORONARY ANGIOGRAM;  Surgeon: Sinclair Grooms, MD;  Location: [facility] CATH LAB;  Service: Cardiovascular;  Laterality: N/A;  . Coronary stent placement  01/30/2015    DES Promus  Premier to LAD by Dr Tamala Julian  . Left heart catheterization with coronary angiogram N/A 01/30/2015    Procedure: LEFT HEART CATHETERIZATION WITH CORONARY ANGIOGRAM;  Surgeon: Belva Crome, MD;  Location: [facility] CATH LAB;  Service: Cardiovascular;  Laterality: N/A;     Current Outpatient Prescriptions  Medication Sig Dispense Refill  . amLODipine (NORVASC) 10 MG tablet Take 1 tablet (10 mg total) by mouth daily. 30 tablet 11  . aspirin EC 81 MG tablet Take 81 mg by mouth daily.    . B-D INS  SYR ULTRAFINE 1CC/30G 30G X 1/2" 1 ML MISC   0  . B-D INS SYR ULTRAFINE 1CC/31G 31G X 5/16" 1 ML MISC   0  . Blood Glucose Monitoring Suppl (ONE TOUCH ULTRA SYSTEM KIT) W/DEVICE KIT Use to test blood sugar daily as instructed. Dx code: E11.59 1 each 0  . Ciclopirox 1 % shampoo Apply 1 application topically every other day. At night after showering  0  . clonazePAM (KLONOPIN) 1 MG tablet Take 1 tablet (1 mg total) by mouth at bedtime as needed for anxiety. 30 tablet 0  . cyclobenzaprine (FLEXERIL) 5 MG tablet Take 1 tablet (5 mg total) by mouth at bedtime as needed for muscle spasms. 30 tablet 3  . esomeprazole (NEXIUM) 40 MG capsule Take 1  capsule (40 mg total) by mouth daily at 12 noon. 30 capsule 11  . glucose blood (BL TEST STRIP PACK) test strip Use to test blood sugar 4 times daily as instructed. Dx code: 250.00 200 each 4  . hydrochlorothiazide (HYDRODIURIL) 12.5 MG tablet Take 1 tablet (12.5 mg total) by mouth daily. 30 tablet 11  . insulin aspart (NOVOLOG) 100 UNIT/ML injection Inject 40 Units into the skin 3 (three) times daily before meals. before breakfast, lunch and supper    . insulin aspart protamine- aspart (NOVOLOG MIX 70/30) (70-30) 100 UNIT/ML injection Inject 30 Units into the skin 3 (three) times daily. Before breakfast and supper & at bedtime    . Insulin Pen Needle 31G X 5 MM MISC Use 3x a day 100 each 11  . Insulin Syringe-Needle U-100 (BD INSULIN SYRINGE ULTRAFINE) 31G X 15/64" 0.5 ML MISC Use 3x a day 100 each 3  . Insulin Syringes, Disposable, U-100 1 ML MISC 60 Units by Does not apply route 4 (four) times daily. 120 each 2  . Lancets (ONETOUCH ULTRASOFT) lancets Use to test blood sugar 4 times daily as instructed. Dx code: 250.00 200 each 4  . lisinopril (PRINIVIL,ZESTRIL) 20 MG tablet Take 1 tablet (20 mg total) by mouth daily. 30 tablet 6  . metoprolol (LOPRESSOR) 50 MG tablet Take 1 tablet (50 mg total) by mouth 2 (two) times daily. (Patient taking differently: Take 50 mg by mouth daily. ) 60 tablet 6  . nitroGLYCERIN (NITROSTAT) 0.4 MG SL tablet Place 1 tablet (0.4 mg total) under the tongue every 5 (five) minutes as needed for chest pain (CP or SOB). 25 tablet 4  . pravastatin (PRAVACHOL) 40 MG tablet Take 1 tablet (40 mg total) by mouth daily. 30 tablet 5  . propranolol (INDERAL) 20 MG tablet Take 20 mg by mouth daily. Given by Dr. Posey Pronto for tremors  0  . ticagrelor (BRILINTA) 90 MG TABS tablet Take 1 tablet (90 mg total) by mouth 2 (two) times daily. 180 tablet 3   No current facility-administered medications for this visit.    Allergies:   Review of patient's allergies indicates no known allergies.     Social History:  The patient  reports that he has never smoked. He has never used smokeless tobacco. He reports that he does not drink alcohol or use illicit drugs.   Family History:  The patient's family history includes Alzheimer's disease in his sister; CAD in his brother and sister; Diabetes in his other; Prostate cancer in his brother.    ROS:  Please see the history of present illness.   Otherwise, review of systems are positive for excessive fatigue, occasional wheezing, anxiety.   All other systems are reviewed  and negative.    PHYSICAL EXAM: VS:  Ht _0  (1.727 m)  Wt 279 lb 12.8 oz (126.916 kg)  BMI 42.55 kg/m2 , BMI Body mass index is 42.55 kg/(m^2). GEN: Obese, in no acute distress Neck: no JVD, HJR, carotid bruits, or masses Cardiac: RRR; positive S4, no murmurs,rubs, thrill or heave,  Respiratory:  clear to auscultation bilaterally, normal work of breathing GI: soft, nontender, nondistended, + BS MS: no deformity or atrophy Extremities: It arm without hematoma or hemorrhage at cath site good radial and brachial pulses lower extremities with bilateral ankle edema otherwise without cyanosis, clubbing,good distal pulses bilaterally.  Skin: warm and dry, no rash Neuro:  Strength and sensation are intact    EKG:  EKG is ordered today. The ekg ordered today demonstrates normal sinus rhythm with first-degree AV block or R-wave progression anteriorly, no acute change  Recent Labs: 04/16/2014: Pro B Natriuretic peptide (BNP) 40.8 12/02/2014: TSH 1.041 02/09/2015: ALT 15 03/21/2015: B Natriuretic Peptide 13.8; BUN 13; Creatinine 1.11; Hemoglobin 12.2*; Platelets 212; Potassium 4.1; Sodium 139    Lipid Panel    Component Value Date/Time   CHOL 155 02/09/2015 1210   TRIG 83.0 02/09/2015 1210   HDL 38.00* 02/09/2015 1210   CHOLHDL 4 02/09/2015 1210   VLDL 16.6 02/09/2015 1210   LDLCALC 100* 02/09/2015 1210      Wt Readings from Last 3 Encounters:  04/06/15 279 lb  12.8 oz (126.916 kg)  03/17/15 274 lb 12.8 oz (124.648 kg)  02/09/15 276 lb (125.193 kg)      Other studies Reviewed: Additional studies/ records that were reviewed today include and review of the records demonstrates:  VASCULAR LAB PRELIMINARY  PRELIMINARY  PRELIMINARY  PRELIMINARY  Left lower extremity venous Doppler completed.    Preliminary report:  There is no DVT or SVT noted in the left lower extremity.  KANADY, CANDACE, RVT 03/21/2015, 6:04 PM   Left heart catheterization 01/30/15 ANGIOGRAPHIC DATA:   The left main coronary artery is widely patent. There is distal 20% narrowing.  The left anterior descending artery is dominant. It wraps around the left ventricular apex. There is irregularity with up to 40% narrowing in the proximal to mid vessel. The mid vessel contains tandem significant stenoses with the proximal lesion located beyond the first diagonal of 85%. The more distal lesion appears to be in the 50-70% range. There is clear progression of the proximal lesion compared to the prior angiogram. The LAD wraps around the left ventricular apex and into the inferior interventricular groove  The left circumflex artery is widely patent. It gives origin to 3 small obtuse marginal branches. Irregularities are noted and no high-grade obstruction is seen..  The right coronary artery is dominant. He contains proximal mid and distal irregularities with 30-50% narrowing. No focally severe obstruction is noted. A twin PDA system arises distally and is free of any significant obstruction.  PCI RESULTS: The LAD contained mid vessel tandem stenoses of 85% and 60%. After predilatation a 3.0 x 24 Promus Premier stent was deployed and post dilated to 3.5 mm in diameter. Brisk TIMI grade 3 flow was noted and no significant stenosis was present post PCI.  LEFT VENTRICULOGRAM:  Left ventricular angiogram was done in the 30 RAO projection and revealed mild apical hypokinesis. EF  55%.   IMPRESSIONS:  1. Severe mid LAD stenosis/tandem with 85/60% stenosis reduced to 0% with TIMI grade 3 flow after DES implantation [employer]). Perfusion abnormality on the recent myocardial perfusion study  and progressive angina fit the clinical picture. 2. Widely patent circumflex and right coronary 3. Normal left ventricular systolic function with elevated end-diastolic pressure compatible with diastolic heart failure   RECOMMENDATION:  Aspirin and Brilinta for at least 12 months  Optimize medical therapy to control blood pressure and lower left ventricular end-diastolic pressure. We should consider adding low-dose diuretic therapy if renal function does not bump after the contrast load. Consider adding hydrochlorothiazide 12.5 mg per day in a.m prior to discharge. Should be ready for discharge in a.m. assuming no complications.   ASSESSMENT AND PLAN:  CAD S/P LAD DES 01/30/15 Patient doing well now without further chest pain. Continue Brilinta and aspirin.   HTN (hypertension) Patient's blood pressure is elevated today. He did not take his Lopressor and lisinopril and has not gotten his prescriptions filled. He also didn't take his HCTZ this morning. Recommend he take HCTZ 25 mg when he gets home. We have given him new prescriptions for lisinopril and metoprolol and asked him to start these. He is to check his blood pressure home and call us with results. 2 g sodium diet. Follow-up with Dr. Tamala Julian in 2-3 months.   Dyslipidemia, goal LDL below [address]     Sumner Boast, PA-C  04/06/2015 10:57 AM    [facility] [place], [place], [place]  [address] Phone: [phone]; Fax: [phone]

## 2015-04-06 NOTE — Assessment & Plan Note (Addendum)
Patient doing well now without further chest pain. Continue Brilinta and aspirin. Follow-up labs.

## 2015-04-20 ENCOUNTER — Telehealth: Payer: Self-pay | Admitting: Internal Medicine

## 2015-04-20 NOTE — Telephone Encounter (Signed)
I contacted the pt and advised we have not received a form from [facility] yet. Pt states he is going to contact them and have them fax the paper work that is needed for him to get his insulin.

## 2015-04-20 NOTE — Telephone Encounter (Signed)
Form has been received and completed. Waiting for doctors signature.

## 2015-04-20 NOTE — Telephone Encounter (Signed)
Patient called stating that he currently has an emergency regarding his insulin  [facility] will be sending a fax over as he is all out of his insulin   Please advise    Thank you

## 2015-04-21 NOTE — Telephone Encounter (Signed)
Form has been completed and faxed. Pt notified via voicemail.

## 2015-04-23 ENCOUNTER — Encounter: Payer: Self-pay | Admitting: Neurology

## 2015-04-23 ENCOUNTER — Ambulatory Visit (INDEPENDENT_AMBULATORY_CARE_PROVIDER_SITE_OTHER): Payer: Medicare Other | Admitting: Neurology

## 2015-04-23 VITALS — BP 170/70 | HR 91 | Ht 68.0 in | Wt 272.0 lb

## 2015-04-23 DIAGNOSIS — E1342 Other specified diabetes mellitus with diabetic polyneuropathy: Secondary | ICD-10-CM

## 2015-04-23 DIAGNOSIS — E1142 Type 2 diabetes mellitus with diabetic polyneuropathy: Secondary | ICD-10-CM

## 2015-04-23 DIAGNOSIS — G629 Polyneuropathy, unspecified: Secondary | ICD-10-CM

## 2015-04-23 DIAGNOSIS — G250 Essential tremor: Secondary | ICD-10-CM | POA: Insufficient documentation

## 2015-04-23 DIAGNOSIS — I10 Essential (primary) hypertension: Secondary | ICD-10-CM | POA: Diagnosis not present

## 2015-04-23 DIAGNOSIS — I200 Unstable angina: Secondary | ICD-10-CM | POA: Diagnosis not present

## 2015-04-23 MED ORDER — PROPRANOLOL HCL 40 MG PO TABS: 40.0000 mg | ORAL_TABLET | Freq: Two times a day (BID) | ORAL | Status: DC

## 2015-04-23 NOTE — Patient Instructions (Addendum)
1. Increase propranolol as follows:  Week 1: 40mg  in the morning and 20mg  at bedtime  Week 2: 40mg  twice daily  2. Avoid caffeinated beverages 3. Your blood pressure is elevated today, please take your medication and monitor at home 4. Return to clinic in 3 months

## 2015-04-23 NOTE — Progress Notes (Signed)
Follow-up Visit   Date: 04/23/2015    Edward [name] MRN: [id number] DOB: 1941-01-23   Interim History: Edward [name] is a 74 y.o. right-handed African American male with history of history of coronary artery disease, TIA (2002, manifested as right sided numbness), diabetes mellitus type 2 (HbA1c 11.1), hypertension, hyperlipidemia, GERD and BPH returning to the clinic for follow-up of diabetic neuropathy, essential tremor, and new complaints of headaches.  The patient was accompanied to the clinic by self.  History of present illness: He moved from [place] in February 2015 to be closer to his brother. He has been diabetic since 1985 and in late 1990s, he developed numbness > tingling of the feet which seems to have been stable since onset. He currently complains of tingling and numbness below the level of the ankles, worse on the right side. He has interemittent numbness/tingling of the right hand and also complains of muscle cramps. He reports falling twice this year, once when he was climbing stairs. He ambulates independently. He saw Dr. Etter Sjogren in April who started neurontin 144m twice daily which seems to help slightly.  He has previously been a [place]ery [place] His last clinic note dated 08/14/2013, indicates that he was being seen for history of memory loss and headaches. Headaches improved significantly after he retired. He has short-term memory problems, which does not interefere with daily life. Mostly forgetting little details or tasks. He is driving and has not been involving in any accidents. He is able to do all his IADLs and ADLs. No problems with finances.  UPDATE 12/02/2014:  He was last seen in June and unfortunately, due to other medical problems that came up, he was unable to be seen in the clinic here until now.  He underwent right shoulder surgery in September 2015 and since then he has noticed a change in his voice.  He complains of  stiffness of his arms, especially in the morning.  He had a fall in December 2015 after slipping on some grease in the kitchen and was unable to get up himself, so called for his brother.  No further falls since then.  He complains about bilateral hand tremor and voice change which is worse when he is stressed. His handwriting has become worse and he has more difficulty with fine motor tasks.  Tremors are predominately worse when trying to do tasks, but can be present when he is resting, too.   He does not drink alcohol or caffiene.  Endorses chronic constipation.  No change in taste/small, vivid dreams, or slowed movements.   His two brothers have tremors. No family history of parkinson's disease. Paresthesias are not very bothersome.    UPDATE 01/15/2015:    He reports having weight gain because of having his insulin changed and noticed new onset headaches.  His headaches are sharp and located over the parietal region.  It lasts 3-4 hours and occurs several times per week. He takes ibuprofen as needed, which help.   He has chronic vision problems and is seeing his eye doctor.  His has noticed improvement since start inderal because he is able to write his name better.  He also states that since paying attention to how his right arm is positioned, his hand paresthesias are better.  Today, he has moderate bilateral ptosis which he noticed over the past several days, denies double vision, weakness, or trouble swallowing.    UPDATE 04/23/2015:  He was having exertional anging  and found to have 50% LAD stenosis for which he underwent DES placement on 4/1.  He is doing well, but blood pressure remains elevated.  His tremors of the hands and voice are most bothersome. 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  He had made himself a lovely breakfast and the tremors were so bad that his plate fell out of his hands and on the floor.  He does feel that tremors were  particularly worse because he had coffee that morning.  He is concerned about the possibility of parkinson's disease. He denies tremors at rest, stiffness, or gait problems.  Movements are a little slow, but he still stays active.     Medications:  Current Outpatient Prescriptions on File Prior to Visit  Medication Sig Dispense Refill  . amLODipine (NORVASC) 10 MG tablet Take 1 tablet (10 mg total) by mouth daily. 30 tablet 11  . aspirin EC 81 MG tablet Take 81 mg by mouth daily.    . B-D INS SYR ULTRAFINE 1CC/30G 30G X 1/2" 1 ML MISC   0  . B-D INS SYR ULTRAFINE 1CC/31G 31G X 5/16" 1 ML MISC   0  . Blood Glucose Monitoring Suppl (ONE TOUCH ULTRA SYSTEM KIT) W/DEVICE KIT Use to test blood sugar daily as instructed. Dx code: E11.59 1 each 0  . Ciclopirox 1 % shampoo Apply 1 application topically every other day. At night after showering  0  . clonazePAM (KLONOPIN) 1 MG tablet Take 1 tablet (1 mg total) by mouth at bedtime as needed for anxiety. 30 tablet 0  . esomeprazole (NEXIUM) 40 MG capsule Take 1 capsule (40 mg total) by mouth daily at 12 noon. 30 capsule 11  . glucose blood (BL TEST STRIP PACK) test strip Use to test blood sugar 4 times daily as instructed. Dx code: 250.00 200 each 4  . hydrochlorothiazide (HYDRODIURIL) 12.5 MG tablet Take 1 tablet (12.5 mg total) by mouth daily. 30 tablet 11  . insulin aspart (NOVOLOG) 100 UNIT/ML injection Inject 40 Units into the skin 3 (three) times daily before meals. before breakfast, lunch and supper    . insulin aspart protamine- aspart (NOVOLOG MIX 70/30) (70-30) 100 UNIT/ML injection Inject 30 Units into the skin 3 (three) times daily. Before breakfast and supper & at bedtime    . Insulin Pen Needle 31G X 5 MM MISC Use 3x a day 100 each 11  . Insulin Syringe-Needle U-100 (BD INSULIN SYRINGE ULTRAFINE) 31G X 15/64" 0.5 ML MISC Use 3x a day 100 each 3  . Insulin Syringes, Disposable, U-100 1 ML MISC 60 Units by Does not apply route 4 (four) times  daily. 120 each 2  . Lancets (ONETOUCH ULTRASOFT) lancets Use to test blood sugar 4 times daily as instructed. Dx code: 250.00 200 each 4  . lisinopril (PRINIVIL,ZESTRIL) 20 MG tablet Take 1 tablet (20 mg total) by mouth daily. 30 tablet 6  . metoprolol (LOPRESSOR) 50 MG tablet Take 1 tablet (50 mg total) by mouth 2 (two) times daily. 60 tablet 6  . nitroGLYCERIN (NITROSTAT) 0.4 MG SL tablet Place 1 tablet (0.4 mg total) under the tongue every 5 (five) minutes as needed for chest pain (CP or SOB). 25 tablet 4  . pravastatin (PRAVACHOL) 40 MG tablet Take 1 tablet (40 mg total) by mouth daily. 30 tablet 5  . ticagrelor (BRILINTA) 90 MG TABS tablet Take 1 tablet (90 mg total) by mouth 2 (two)  times daily. 180 tablet 3   No current facility-administered medications on file prior to visit.    Allergies: No Known Allergies  Review of Systems:  CONSTITUTIONAL: No fevers, chills, night sweats, or weight loss.  EYES: No visual changes or eye pain ENT: No hearing changes.  No history of nose bleeds.   RESPIRATORY: No cough, wheezing and shortness of breath.   CARDIOVASCULAR: Negative for chest pain, and palpitations.   GI: Negative for abdominal discomfort, blood in stools or black stools.  No recent change in bowel habits.   GU:  No history of incontinence.   MUSCLOSKELETAL: +history of joint pain or swelling.  No myalgias.   SKIN: Negative for lesions, rash, and itching.   ENDOCRINE: Negative for cold or heat intolerance, polydipsia or goiter.   PSYCH:  no depression or anxiety symptoms.   NEURO: As Above.   Vital Signs:  BP 170/70 mmHg  Pulse 91  Ht _0  (1.727 m)  Wt 272 lb (123.378 kg)  BMI 41.37 kg/m2  SpO2 98%  Neurological Exam: MENTAL STATUS including orientation to time, place, person, recent and remote memory, attention span and concentration, language, and fund of knowledge is normal.  Tremulous speech pattern, he is able to enunciate lingual and gutteral sounds well.  There  is no dysarthria.   CRANIAL NERVES:   Normal conjugate, extra-ocular eye movements in all directions of gaze.  Mild bilateral ptosis without worsening with sustained upward gaze.Face is symmetric. Facial muscles are intact.  Palate elevates symmetrically.  Tongue is midline.    MOTOR:  Motor strength is 5/5 in all extremities.  Intermittent intention tremor at end-point, but voice tremor is prominent. Tone is normal.    MSRs:   Right                                                                 Left brachioradialis 2+  brachioradialis 2+  biceps 1+  biceps 1+  triceps 1+  triceps 1+  patellar 0  Patellar 0  ankle jerk 0  ankle jerk 0    COORDINATION/GAIT:    Gait narrow based and stable, turns with 3 steps. Finger tapping mildly reduced bilaterally.  Data: EMG 12/01/2014: 1. 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 Overall, these findings are moderately severe in degree electrically.  2. 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 There is no evidence of a cervical/lumbosacral radiculopathy affecting the right side  Lab Results  Component Value Date   HGBA1C 8.9* 02/09/2015   Labs 04/08/2014:  Copper 64*, ceruloplasmin 18, vitamin B12 290, SPEP/UPEP with IFE no M protein  CT head 10/21/2014 and 01/15/2015:  Negative   IMPRESSION/PLAN: 1. Essential tremor involving the hands and voice, worse.  Parkinson's disease is less likely especially with primary action tremors and lack of rigidity, gait instability, and resting tremors.  There is mild bradykinesia which I will watch.  Further, given his response to propranolol in the past, I would favor optimizing this medication.  Going forward, a trial of sinemet can be given if necessary.  Increase propranolol 48m BID Avoid caffeinated beverages  2.  Tension  headaches -  improved Discontinue flexeril  3.  Bilateral ptosis, MG antibody negative Consider RNS if symptoms worsen  4.  Elevated blood pressure, asympomatic Encouraged to take home medications, he reports not taking his HCTZ this morning because of frequent urination but will take it once he gets honme  5.  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  Paresthesias not bothersome enough to start medications Encouraged tight glycemic control  6.  Right ulnar neuropathy at the elbow, improved.   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  Return to clinic in 3 months  The duration of this appointment visit was 30 minutes of face-to-face time with the patient.  Greater than 50% of this time was spent in counseling, explanation of diagnosis, planning of further management, and coordination of care.   Thank you for allowing me to participate in patient's care.  If I can answer any additional questions, I would be pleased to do so.    Sincerely,    Shelle Galdamez K. Posey Pronto, DO

## 2015-04-24 ENCOUNTER — Telehealth: Payer: Self-pay | Admitting: Internal Medicine

## 2015-04-24 NOTE — Telephone Encounter (Signed)
Pt is asking if we can give him a sample for one vial of the novolin.Marland Kitchen He is having a lot of issues with his insurance.

## 2015-04-29 ENCOUNTER — Encounter: Payer: Self-pay | Admitting: Family Medicine

## 2015-04-30 ENCOUNTER — Telehealth: Payer: Self-pay | Admitting: Family Medicine

## 2015-04-30 DIAGNOSIS — M2042 Other hammer toe(s) (acquired), left foot: Secondary | ICD-10-CM | POA: Diagnosis not present

## 2015-04-30 DIAGNOSIS — I739 Peripheral vascular disease, unspecified: Secondary | ICD-10-CM | POA: Diagnosis not present

## 2015-04-30 DIAGNOSIS — L603 Nail dystrophy: Secondary | ICD-10-CM | POA: Diagnosis not present

## 2015-04-30 DIAGNOSIS — E1051 Type 1 diabetes mellitus with diabetic peripheral angiopathy without gangrene: Secondary | ICD-10-CM | POA: Diagnosis not present

## 2015-04-30 DIAGNOSIS — L84 Corns and callosities: Secondary | ICD-10-CM | POA: Diagnosis not present

## 2015-04-30 DIAGNOSIS — M2041 Other hammer toe(s) (acquired), right foot: Secondary | ICD-10-CM | POA: Diagnosis not present

## 2015-04-30 NOTE — Telephone Encounter (Signed)
Pt returning your call. Please call back at [phone].

## 2015-04-30 NOTE — Telephone Encounter (Signed)
Pt returning your call. Please call back at [phone].

## 2015-04-30 NOTE — Telephone Encounter (Signed)
If we have it he can have vials.      ----- Message -----     From: [name]     Sent: 04/30/2015  8:23 AM      To: Rosalita Chessman, DO    Subject: Non-Urgent Medical Question                 ----- Message from Cleora sent at 04/30/2015 8:23 AM EDT -----            ----- Message from [name] to Rosalita Chessman, DO sent at 04/29/2015 3:35 PM -----     Good Afternoon Dr. Etter Sjogren and Maudie Mercury:        I am still confronted with the problem of getting my insulin through my insurance company ( [employer]). I have file an appeal through [employer]. Social Security has assure me that this matter should be resolve in July. I can not pay $158.00 for one value of insulin.        Dr. Cruzita Lederer, has provided me with insulin for the month of June but unable for the month of July. I am asking if your office could provide me with ( 2 values of Humulin R-100 and 3 pens of Humulin 75-30).        Please keep me in your prayers, dealing with this system have been a challenge.        I can come out tomorrow or Friday.        Thank you         A; Wich

## 2015-04-30 NOTE — Telephone Encounter (Signed)
Message left to call the office. I need to know which insulin the patient is taking to possibly provide him with samples.     KP

## 2015-04-30 NOTE — Telephone Encounter (Signed)
Spoke with patient and he said he is taking the Novolog and Novolog 70/30. I made the patient aware that we only have the regular Novolog and he verbalized understanding. Samples left int he fridge.    KP

## 2015-05-12 DIAGNOSIS — H40023 Open angle with borderline findings, high risk, bilateral: Secondary | ICD-10-CM | POA: Diagnosis not present

## 2015-05-12 DIAGNOSIS — H16223 Keratoconjunctivitis sicca, not specified as Sjogren's, bilateral: Secondary | ICD-10-CM | POA: Diagnosis not present

## 2015-05-13 DIAGNOSIS — Z961 Presence of intraocular lens: Secondary | ICD-10-CM | POA: Diagnosis not present

## 2015-05-13 DIAGNOSIS — H109 Unspecified conjunctivitis: Secondary | ICD-10-CM | POA: Diagnosis not present

## 2015-05-15 ENCOUNTER — Encounter: Payer: Self-pay | Admitting: Family Medicine

## 2015-05-20 ENCOUNTER — Other Ambulatory Visit: Payer: Self-pay | Admitting: *Deleted

## 2015-05-20 ENCOUNTER — Ambulatory Visit (INDEPENDENT_AMBULATORY_CARE_PROVIDER_SITE_OTHER): Payer: Medicare Other | Admitting: Internal Medicine

## 2015-05-20 ENCOUNTER — Other Ambulatory Visit (INDEPENDENT_AMBULATORY_CARE_PROVIDER_SITE_OTHER): Payer: Medicare Other | Admitting: *Deleted

## 2015-05-20 ENCOUNTER — Encounter: Payer: Self-pay | Admitting: Internal Medicine

## 2015-05-20 DIAGNOSIS — I200 Unstable angina: Secondary | ICD-10-CM

## 2015-05-20 DIAGNOSIS — E1159 Type 2 diabetes mellitus with other circulatory complications: Secondary | ICD-10-CM

## 2015-05-20 DIAGNOSIS — E119 Type 2 diabetes mellitus without complications: Secondary | ICD-10-CM | POA: Diagnosis not present

## 2015-05-20 LAB — POCT GLYCOSYLATED HEMOGLOBIN (HGB A1C): HEMOGLOBIN A1C: 10.5

## 2015-05-20 MED ORDER — INSULIN ASPART PROT & ASPART (70-30 MIX) 100 UNIT/ML ~~LOC~~ SUSP: SUBCUTANEOUS | Status: DC

## 2015-05-20 MED ORDER — METFORMIN HCL ER 500 MG PO TB24: 500.0000 mg | ORAL_TABLET | Freq: Two times a day (BID) | ORAL | Status: DC

## 2015-05-20 NOTE — Progress Notes (Signed)
Patient ID: Edward [name], male   DOB: 1941/09/14, 74 y.o.   MRN: [id number]  HPI: Edward [name] is a 74 y.o.-year-old male, returning for f/u for DM2,  dx 1987, insulin-dependent since 1989, uncontrolled, with complications (CAD, PN). [place] moved [place] in 12/2013. Last visit 2 mo ago.  Last hemoglobin A1c was:  Lab Results  Component Value Date   HGBA1C 10.5 05/20/2015   HGBA1C 8.9* 02/09/2015   HGBA1C 11.1* 12/02/2014   He was on: - Levemir 75 >> 90 units at bedtime. - NovoLog: - 30 >> 40 units with breakfast - 20 >> 30  units with lunch - 30 >> 40 units with dinner But he felt that this regimen was not working. He was on Metformin >> N/V/D.  We tried to switch to U500 insulin - could not get it as it was 400$ >> given him coupons: - 12-14 units before each meal Could not afford it. Will change insurances and will be able to get U500 since 08/2015.  We started:  Insulin  Before breakfast  Before lunch  Before dinner  Bedtime  Novolin 70/30 (was off for 2 months until 05/01/2015!) 40  40 40  If sugars >200;  Takes 30 units  Novolin 30 30 30     Pt checks his sugars 4-5 a day. Reviewed sugars - improved since 05/01/2015 after he restarted insulin 70/30. - am: 180-270 >> 100-331 (436) >> 128-344 >> 52, 60, 79-339 >> 90-120s >> 150-240 - 2h after b'fast:200 >> 78 x1, 209-337 >> 128-263, 380 >> 170-180 >> 120-125 (after 1h exercise in the pool) - before lunch: 280-310 >> 216, 294, 402 >> 179-370 >> 68, 178-299, 319  >> 130s >> 200 - 2h after lunch: n/c >> 222, 375, 545 >> 198-309 >> 121-254 >> 170-180s >> n/c - before dinner: 280s >> 177 >> 67 x1, 175-312 >> 302, 395 >> 170s >> 200 - 2h after dinner: n/c >> 301-379>> 99, 128, 368 >> n/c  - bedtime: 200-300 >> 190-463 >> 46, 284 >> <200 >> 190-200 - nighttime: n/c >> 300s >> 139-339 >> n/c Lowest sugar was 100 - he has hypoglycemia awareness at 90  Highest sugar was 398.  Has a One Touch Ultra meter.   Pt's meals are: -  Breakfast: eggs, oatmeal, grits, bacon - Lunch: baked chiken + vegetables - Dinner: same as lunch - Snacks: sweets - cookies Rarely fast food. He gave up sodas. He still drink fruit juice.  - mild CKD, last BUN/creatinine:  Lab Results  Component Value Date   BUN 13 03/21/2015   CREATININE 1.11 03/21/2015  On Lisinopril. ACR 37.6 in 02/11/2014) - Has HL. Last set of lipids: Lab Results  Component Value Date   CHOL 155 02/09/2015   HDL 38.00* 02/09/2015   LDLCALC 100* 02/09/2015   TRIG 83.0 02/09/2015   CHOLHDL 4 02/09/2015  Not on a statin. He gets leg cramps from Lipitor >> cannot tolerate it. - last eye exam was in 04/2015 (Dr Lanell Matar) 10/2014 (Dr Zigmund Daniel). Has DR. Had cataracts sx.  - + numbness and tingling in his feet. He saw podiatrist (05/2015) >> has PN. On ASA 81.  I reviewed pt's medications, allergies, PMH, social hx, family hx, and changes were documented in the history of present illness. Otherwise, unchanged from my initial visit note.  He had surgery for his R shoulder in 07/30/2014.   ROS: Constitutional: no weight gain, + fatigue, no hot flushes, + poor sleep Eyes: no  blurry vision, no xerophthalmia ENT: no sore throat, no nodules palpated in throat, no dysphagia/odynophagia, no hoarseness Cardiovascular: no CP/SOB/no palpitations/no leg swelling Respiratory: no cough/SOB/wheezing Gastrointestinal: no N/V/D/C/heartburn Musculoskeletal: no muscle aches/no joint aches  Skin: no rash Neurological: + tremors/no numbness/tingling/dizziness  PE: BP 122/70 mmHg  Pulse 78  Temp(Src) 98.1 F (36.7 C) (Oral)  Resp 12  Wt 269 lb (122.018 kg)  SpO2 98% Body mass index is 40.91 kg/(m^2). Wt Readings from Last 3 Encounters:  05/20/15 269 lb (122.018 kg)  04/23/15 272 lb (123.378 kg)  04/06/15 279 lb 12.8 oz (126.916 kg)   Constitutional: obese, in NAD Eyes: PERRLA, EOMI, no exophthalmos ENT: moist mucous membranes, no thyromegaly, no cervical  lymphadenopathy Cardiovascular: RRR, No MRG, + B Ankle swelling Respiratory: CTA B Gastrointestinal: abdomen soft, NT, ND, BS+ Musculoskeletal: no deformities, strength intact in all 4 Skin: moist, warm, no rashes Neurological: no tremor with outstretched hands, DTR normal in all 4  ASSESSMENT: 1. DM2, insulin-dependent, uncontrolled, with complications - CAD H/o steroid inj's  PLAN:  1. Patient with long-standing, uncontrolled diabetes, on premixed + regular insulin regimen. We tried to start U500 insulin, and this worked great, with significant decrease in HbA1c and feeling much better, but he could not get it because this was too expensive. He also could not get the 70/30 insulin for 2 mo >> sugars much higher then and started to decrease after he started the premixed insulin 3 weeks ago. Will also try to add Metformin XR 500 mg bid as he tried his brother in law's metformin and this helped. He had diarrhea with the regular form >> will send the Merformin XR. - I suggested to:  Patient Instructions    Please switch to the following regimen, until you get the U500:  Insulin  Before breakfast  Before lunch  Before dinner  Bedtime  Novolin 70/30  40  40 40  If sugars >200;  take 30 units  Novolin 30 30 30      Please start Metformin ER 500 mg with dinner for 4 days, then increase to 500 mg 2x a day.   Please let me know if you need more Metformin ER.   Please continue exercise >> let me know if sugars increase.  Please return in 1.5 months with your sugar log.   - continue checking sugars at different times of the day - check 3-4 times a day, rotating checks - up to date with yearly eye exams  - checked HbA1c >> 10.5% (higher than before) - Return to clinic in 3 mo with sugar log

## 2015-05-20 NOTE — Patient Instructions (Addendum)
  Please continue the following regimen, until you get the U500:  Insulin  Before breakfast  Before lunch  Before dinner  Bedtime  Novolin 70/30  40  40 40  If sugars >200;  take 30 units  Novolin 30 30 30      Please start Metformin ER 500 mg with dinner for 4 days, then increase to 500 mg 2x a day.   Please let me know if you need more Metformin ER.   Please continue exercise >> let me know if sugars increase.  Please return in 1.5 months with your sugar log.

## 2015-05-21 ENCOUNTER — Ambulatory Visit: Payer: Medicare Other | Admitting: Family Medicine

## 2015-06-04 ENCOUNTER — Ambulatory Visit (INDEPENDENT_AMBULATORY_CARE_PROVIDER_SITE_OTHER): Payer: Medicare Other | Admitting: Family Medicine

## 2015-06-04 ENCOUNTER — Encounter (HOSPITAL_BASED_OUTPATIENT_CLINIC_OR_DEPARTMENT_OTHER): Payer: Self-pay

## 2015-06-04 ENCOUNTER — Encounter: Payer: Self-pay | Admitting: Family Medicine

## 2015-06-04 ENCOUNTER — Inpatient Hospital Stay (HOSPITAL_BASED_OUTPATIENT_CLINIC_OR_DEPARTMENT_OTHER)
Admission: EM | Admit: 2015-06-04 | Discharge: 2015-06-05 | DRG: 313 | Disposition: A | Discharge status: Home / Self Care | Payer: Medicare Other | Attending: Interventional Cardiology | Admitting: Interventional Cardiology

## 2015-06-04 ENCOUNTER — Emergency Department (HOSPITAL_BASED_OUTPATIENT_CLINIC_OR_DEPARTMENT_OTHER): Payer: Medicare Other

## 2015-06-04 VITALS — BP 154/72 | HR 74 | Temp 98.4°F | Ht 68.0 in | Wt 274.4 lb

## 2015-06-04 DIAGNOSIS — I10 Essential (primary) hypertension: Secondary | ICD-10-CM | POA: Diagnosis not present

## 2015-06-04 DIAGNOSIS — I200 Unstable angina: Secondary | ICD-10-CM

## 2015-06-04 DIAGNOSIS — E119 Type 2 diabetes mellitus without complications: Secondary | ICD-10-CM | POA: Diagnosis present

## 2015-06-04 DIAGNOSIS — I252 Old myocardial infarction: Secondary | ICD-10-CM

## 2015-06-04 DIAGNOSIS — R079 Chest pain, unspecified: Secondary | ICD-10-CM | POA: Diagnosis not present

## 2015-06-04 DIAGNOSIS — Z794 Long term (current) use of insulin: Secondary | ICD-10-CM | POA: Diagnosis not present

## 2015-06-04 DIAGNOSIS — Z7982 Long term (current) use of aspirin: Secondary | ICD-10-CM | POA: Diagnosis not present

## 2015-06-04 DIAGNOSIS — E785 Hyperlipidemia, unspecified: Secondary | ICD-10-CM | POA: Diagnosis not present

## 2015-06-04 DIAGNOSIS — Z9841 Cataract extraction status, right eye: Secondary | ICD-10-CM

## 2015-06-04 DIAGNOSIS — R0789 Other chest pain: Secondary | ICD-10-CM

## 2015-06-04 DIAGNOSIS — R0602 Shortness of breath: Secondary | ICD-10-CM | POA: Diagnosis not present

## 2015-06-04 DIAGNOSIS — R251 Tremor, unspecified: Secondary | ICD-10-CM | POA: Diagnosis present

## 2015-06-04 DIAGNOSIS — Z96653 Presence of artificial knee joint, bilateral: Secondary | ICD-10-CM | POA: Diagnosis not present

## 2015-06-04 DIAGNOSIS — Z6841 Body Mass Index (BMI) 40.0 and over, adult: Secondary | ICD-10-CM

## 2015-06-04 DIAGNOSIS — G4730 Sleep apnea, unspecified: Secondary | ICD-10-CM | POA: Diagnosis present

## 2015-06-04 DIAGNOSIS — I2510 Atherosclerotic heart disease of native coronary artery without angina pectoris: Secondary | ICD-10-CM | POA: Diagnosis not present

## 2015-06-04 DIAGNOSIS — Z9861 Coronary angioplasty status: Secondary | ICD-10-CM

## 2015-06-04 DIAGNOSIS — Z9842 Cataract extraction status, left eye: Secondary | ICD-10-CM

## 2015-06-04 DIAGNOSIS — Z955 Presence of coronary angioplasty implant and graft: Secondary | ICD-10-CM

## 2015-06-04 DIAGNOSIS — K219 Gastro-esophageal reflux disease without esophagitis: Secondary | ICD-10-CM | POA: Diagnosis present

## 2015-06-04 DIAGNOSIS — R05 Cough: Secondary | ICD-10-CM | POA: Diagnosis not present

## 2015-06-04 LAB — COMPREHENSIVE METABOLIC PANEL
ALBUMIN: 3.7 g/dL (ref 3.5–5.0)
ALK PHOS: 95 U/L (ref 38–126)
ALT: 18 U/L (ref 17–63)
AST: 18 U/L (ref 15–41)
Anion gap: 7 (ref 5–15)
BILIRUBIN TOTAL: 0.5 mg/dL (ref 0.3–1.2)
BUN: 14 mg/dL (ref 6–20)
CHLORIDE: 100 mmol/L — AB (ref 101–111)
CO2: 32 mmol/L (ref 22–32)
Calcium: 9.3 mg/dL (ref 8.9–10.3)
Creatinine, Ser: 0.95 mg/dL (ref 0.61–1.24)
GFR calc non Af Amer: >60 mL/min (ref >60)
Glucose, Bld: 153 mg/dL — ABNORMAL HIGH (ref 65–99)
POTASSIUM: 3.5 mmol/L (ref 3.5–5.1)
SODIUM: 139 mmol/L (ref 135–145)
TOTAL PROTEIN: 6.8 g/dL (ref 6.5–8.1)

## 2015-06-04 LAB — CBG MONITORING, ED
Glucose-Capillary: 57 mg/dL — ABNORMAL LOW (ref 65–99)
Glucose-Capillary: 64 mg/dL — ABNORMAL LOW (ref 65–99)

## 2015-06-04 LAB — TROPONIN I
Troponin I: <0.03 ng/mL (ref <0.031)
Troponin I: <0.03 ng/mL (ref <0.031)

## 2015-06-04 LAB — GLUCOSE, CAPILLARY
Glucose-Capillary: 158 mg/dL — ABNORMAL HIGH (ref 65–99)
Glucose-Capillary: 351 mg/dL — ABNORMAL HIGH (ref 65–99)

## 2015-06-04 LAB — CBC WITH DIFFERENTIAL/PLATELET
BASOS PCT: 0 % (ref 0–1)
Basophils Absolute: 0 10*3/uL (ref 0.0–0.1)
Eosinophils Absolute: 0.2 10*3/uL (ref 0.0–0.7)
Eosinophils Relative: 3 % (ref 0–5)
HCT: 38.2 % — ABNORMAL LOW (ref 39.0–52.0)
Hemoglobin: 12.9 g/dL — ABNORMAL LOW (ref 13.0–17.0)
LYMPHS ABS: 1.4 10*3/uL (ref 0.7–4.0)
Lymphocytes Relative: 21 % (ref 12–46)
MCH: 28.4 pg (ref 26.0–34.0)
MCHC: 33.8 g/dL (ref 30.0–36.0)
MCV: 84 fL (ref 78.0–100.0)
MONO ABS: 0.7 10*3/uL (ref 0.1–1.0)
MONOS PCT: 11 % (ref 3–12)
Neutro Abs: 4.4 10*3/uL (ref 1.7–7.7)
Neutrophils Relative %: 65 % (ref 43–77)
Platelets: 248 10*3/uL (ref 150–400)
RBC: 4.55 MIL/uL (ref 4.22–5.81)
RDW: 13.2 % (ref 11.5–15.5)
WBC: 6.8 10*3/uL (ref 4.0–10.5)

## 2015-06-04 LAB — BRAIN NATRIURETIC PEPTIDE: B Natriuretic Peptide: 35.1 pg/mL (ref 0.0–100.0)

## 2015-06-04 LAB — HEPARIN LEVEL (UNFRACTIONATED): HEPARIN UNFRACTIONATED: 0.2 [IU]/mL — AB (ref 0.30–0.70)

## 2015-06-04 MED ORDER — HYDROCHLOROTHIAZIDE 25 MG PO TABS
25.0000 mg | ORAL_TABLET | Freq: Every day | ORAL | Status: DC
  Filled 2015-06-04: qty 1

## 2015-06-04 MED ORDER — CLOPIDOGREL BISULFATE 75 MG PO TABS
75.0000 mg | ORAL_TABLET | Freq: Every day | ORAL | Status: DC
  Administered 2015-06-04 – 2015-06-05 (×2): 75 mg via ORAL
  Filled 2015-06-04 (×2): qty 1

## 2015-06-04 MED ORDER — INSULIN ASPART PROT & ASPART (70-30 MIX) 100 UNIT/ML ~~LOC~~ SUSP
30.0000 [IU] | Freq: Once | SUBCUTANEOUS | Status: AC
  Administered 2015-06-04: 30 [IU] via SUBCUTANEOUS
  Filled 2015-06-04: qty 10

## 2015-06-04 MED ORDER — DEXTROSE 50 % IV SOLN
25.0000 mL | Freq: Once | INTRAVENOUS | Status: AC
  Administered 2015-06-04: 25 mL via INTRAVENOUS
  Filled 2015-06-04: qty 50

## 2015-06-04 MED ORDER — AMLODIPINE BESYLATE 10 MG PO TABS
10.0000 mg | ORAL_TABLET | Freq: Every day | ORAL | Status: DC
  Administered 2015-06-05: 10 mg via ORAL
  Filled 2015-06-04: qty 1

## 2015-06-04 MED ORDER — PRAVASTATIN SODIUM 40 MG PO TABS
40.0000 mg | ORAL_TABLET | Freq: Every day | ORAL | Status: DC
Start: 2015-06-05 — End: 2015-06-05
  Administered 2015-06-05: 40 mg via ORAL
  Filled 2015-06-04: qty 1

## 2015-06-04 MED ORDER — HEPARIN SODIUM (PORCINE) 5000 UNIT/ML IJ SOLN
INTRAMUSCULAR | Status: AC
  Administered 2015-06-04: 4000 [IU]
  Filled 2015-06-04: qty 1

## 2015-06-04 MED ORDER — NITROGLYCERIN IN D5W 200-5 MCG/ML-% IV SOLN
0.0000 ug/min | INTRAVENOUS | Status: DC
  Administered 2015-06-04: 10 ug/min via INTRAVENOUS
  Filled 2015-06-04 (×2): qty 250

## 2015-06-04 MED ORDER — NITROGLYCERIN 0.4 MG SL SUBL
0.4000 mg | SUBLINGUAL_TABLET | SUBLINGUAL | Status: DC | PRN
  Administered 2015-06-04: 0.4 mg via SUBLINGUAL
  Filled 2015-06-04: qty 1

## 2015-06-04 MED ORDER — ONDANSETRON HCL 4 MG/2ML IJ SOLN: 4.0000 mg | Freq: Four times a day (QID) | INTRAMUSCULAR | Status: DC | PRN

## 2015-06-04 MED ORDER — HEPARIN (PORCINE) IN NACL 100-0.45 UNIT/ML-% IJ SOLN
1500.0000 [IU]/h | INTRAMUSCULAR | Status: DC
Start: 2015-06-04 — End: 2015-06-05
  Administered 2015-06-04: 1200 [IU]/h via INTRAVENOUS
  Filled 2015-06-04 (×2): qty 250

## 2015-06-04 MED ORDER — ASPIRIN 81 MG PO CHEW
324.0000 mg | CHEWABLE_TABLET | Freq: Once | ORAL | Status: AC
  Administered 2015-06-04: 243 mg via ORAL
  Filled 2015-06-04: qty 4

## 2015-06-04 MED ORDER — METOPROLOL TARTRATE 50 MG PO TABS
50.0000 mg | ORAL_TABLET | Freq: Two times a day (BID) | ORAL | Status: DC
  Administered 2015-06-04 – 2015-06-05 (×2): 50 mg via ORAL
  Filled 2015-06-04 (×2): qty 1

## 2015-06-04 MED ORDER — MORPHINE SULFATE 2 MG/ML IJ SOLN
2.0000 mg | INTRAMUSCULAR | Status: DC | PRN
  Administered 2015-06-04: 2 mg via INTRAVENOUS
  Filled 2015-06-04: qty 1

## 2015-06-04 MED ORDER — PANTOPRAZOLE SODIUM 40 MG PO TBEC
40.0000 mg | DELAYED_RELEASE_TABLET | Freq: Every day | ORAL | Status: DC
  Administered 2015-06-05: 40 mg via ORAL
  Filled 2015-06-04: qty 1

## 2015-06-04 MED ORDER — LISINOPRIL 20 MG PO TABS
20.0000 mg | ORAL_TABLET | Freq: Every day | ORAL | Status: DC
  Administered 2015-06-05: 20 mg via ORAL
  Filled 2015-06-04: qty 1

## 2015-06-04 MED ORDER — NITROGLYCERIN 0.4 MG SL SUBL: 0.4000 mg | SUBLINGUAL_TABLET | SUBLINGUAL | Status: DC | PRN

## 2015-06-04 MED ORDER — ALUM & MAG HYDROXIDE-SIMETH 200-200-20 MG/5ML PO SUSP
15.0000 mL | Freq: Four times a day (QID) | ORAL | Status: DC | PRN
  Administered 2015-06-04: 15 mL via ORAL
  Filled 2015-06-04: qty 30

## 2015-06-04 MED ORDER — ACETAMINOPHEN 325 MG PO TABS
650.0000 mg | ORAL_TABLET | ORAL | Status: DC | PRN
  Administered 2015-06-05: 650 mg via ORAL
  Filled 2015-06-04: qty 2

## 2015-06-04 MED ORDER — ASPIRIN EC 81 MG PO TBEC
81.0000 mg | DELAYED_RELEASE_TABLET | Freq: Every day | ORAL | Status: DC
  Administered 2015-06-05: 81 mg via ORAL
  Filled 2015-06-04: qty 1

## 2015-06-04 MED ORDER — HEPARIN BOLUS VIA INFUSION: 4000.0000 [IU] | Freq: Once | INTRAVENOUS | Status: AC

## 2015-06-04 MED ORDER — INSULIN ASPART 100 UNIT/ML ~~LOC~~ SOLN: 40.0000 [IU] | Freq: Three times a day (TID) | SUBCUTANEOUS | Status: DC

## 2015-06-04 NOTE — ED Notes (Signed)
Pt reports chest pain starting this AM. Radiating to left arm. Describes pain as pressure in chest. Reports some SHOB

## 2015-06-04 NOTE — ED Notes (Signed)
Report called to Cesear, Therapist, sports at [facility]

## 2015-06-04 NOTE — ED Notes (Signed)
Checked CBG. Same was 7. Per RN, give patient orange juice.

## 2015-06-04 NOTE — ED Notes (Signed)
[facility] here at this time 

## 2015-06-04 NOTE — ED Provider Notes (Addendum)
CSN: [id number]     Arrival date & time 06/04/15  1106 History   First MD Initiated Contact with Patient 06/04/15 1118     Chief Complaint  Patient presents with  . Chest Pain     (Consider location/radiation/quality/duration/timing/severity/associated sxs/prior Treatment) HPI Comments: Patient with a history of diabetes, hypertension, hyperlipidemia, and coronary artery disease status post PCI with drug-eluting stent to the LAD in April of this year presents with chest pain. He states about 7:30 this morning he developed some tightness across his chest into the left side. He also has some radiation to his left shoulder. It's been intermittent since that time. He still does complain of some tightness now. He took Tums as well as 2 nitroglycerin and baby aspirin with some relief in symptoms. However it recurred when he went to his primary care office this morning. He was sent over here for further evaluation. He's had a dry cough for the last 2-3 days. He denies any fevers. He denies any associated nausea or vomiting. There is no diaphoresis. He states it feels similar to when he had a heart attack although his pain today is not as bad as when he had his heart attack. He denies any leg pain or swelling. However his niece feels that his legs are little bit more swollen than normal. He doesn't feel like his symptoms are pleuritic. There is no exertional component.  Patient is a 74 y.o. male presenting with chest pain.  Chest Pain Associated symptoms: cough   Associated symptoms: no abdominal pain, no back pain, no diaphoresis, no dizziness, no fatigue, no fever, no headache, no nausea, no numbness, no shortness of breath, not vomiting and no weakness     Past Medical History  Diagnosis Date  . Hypertension   . Arthritis   . CAD (coronary artery disease)     a. Pt reports "small heart attack" in 2013 at [facility], denies need for stent or CABG, does not know findings of cath.  Marland Kitchen  TIA (transient ischemic attack) 2002  . Dyslipidemia, goal LDL below 70 01/29/2014  . Heart attack 02/03/2014    "mild heart attack"  . GERD (gastroesophageal reflux disease)   . Enlarged prostate   . Shortness of breath     exertion  . Sleep apnea     does not use cpap  . Pneumonia 01/2013  . Pneumonia 03/2014    hospitalized  . DM type 2 (diabetes mellitus, type 2), insulin dependent 01/29/2014    fasting cbg 50-120 with new regimen  . Anxiety   . LFYBOFBP(102.5)    Past Surgical History  Procedure Laterality Date  . Total knee arthroplasty Bilateral   . Cardiac catheterization  01/28/14    + CAD treat medically  . Lumbar disc surgery    . Cataract extraction Bilateral   . Joint replacement Bilateral     knee   . Back surgery    . Eye surgery Bilateral   . Shoulder arthroscopy Right 07/30/2014    Procedure: Right Shoulder Arthroscopy, Debridement, Decompression, Manipulation Under Anesthesia;  Surgeon: Newt Minion, MD;  Location: [facility];  Service: Orthopedics;  Laterality: Right;  . Left heart catheterization with coronary angiogram N/A 01/28/2014    Procedure: LEFT HEART CATHETERIZATION WITH CORONARY ANGIOGRAM;  Surgeon: Sinclair Grooms, MD;  Location: [facility] CATH LAB;  Service: Cardiovascular;  Laterality: N/A;  . Coronary stent placement  01/30/2015    DES Promus  Premier to LAD by Dr  Smith  . Left heart catheterization with coronary angiogram N/A 01/30/2015    Procedure: LEFT HEART CATHETERIZATION WITH CORONARY ANGIOGRAM;  Surgeon: Belva Crome, MD;  Location: [facility] CATH LAB;  Service: Cardiovascular;  Laterality: N/A;   Family History  Problem Relation Age of Onset  . CAD Brother     2 brothers - CABG  . Alzheimer's disease Sister   . CAD Sister   . Prostate cancer Brother   . Diabetes Other     entire family   History  Substance Use Topics  . Smoking status: Never Smoker   . Smokeless tobacco: Never Used  . Alcohol Use: No    Review of Systems  Constitutional: Negative  for fever, chills, diaphoresis and fatigue.  HENT: Negative for congestion, rhinorrhea and sneezing.   Eyes: Negative.   Respiratory: Positive for cough. Negative for chest tightness and shortness of breath.   Cardiovascular: Positive for chest pain. Negative for leg swelling.  Gastrointestinal: Negative for nausea, vomiting, abdominal pain, diarrhea and blood in stool.  Genitourinary: Negative for frequency, hematuria, flank pain and difficulty urinating.  Musculoskeletal: Negative for back pain and arthralgias.  Skin: Negative for rash.  Neurological: Negative for dizziness, speech difficulty, weakness, numbness and headaches.      Allergies  Review of patient's allergies indicates no known allergies.  Home Medications   Prior to Admission medications   Medication Sig Start Date End Date Taking? Authorizing Provider  amLODipine (NORVASC) 10 MG tablet Take 1 tablet (10 mg total) by mouth daily. 12/29/14   Rosalita Chessman, DO  aspirin EC 81 MG tablet Take 81 mg by mouth daily.    Historical Provider, MD  B-D INS SYR ULTRAFINE 1CC/30G 30G X 1/2" 1 ML MISC  11/01/14   Historical Provider, MD  B-D INS SYR ULTRAFINE 1CC/31G 31G X 5/16" 1 ML MISC  01/06/15   Historical Provider, MD  Blood Glucose Monitoring Suppl (ONE TOUCH ULTRA SYSTEM KIT) W/DEVICE KIT Use to test blood sugar daily as instructed. Dx code: E11.59 12/30/14   Philemon Kingdom, MD  Ciclopirox 1 % shampoo Apply 1 application topically every other day. At night after showering 11/20/14   Historical Provider, MD  clonazePAM (KLONOPIN) 1 MG tablet Take 1 tablet (1 mg total) by mouth at bedtime as needed for anxiety. 04/02/15   Rosalita Chessman, DO  esomeprazole (NEXIUM) 40 MG capsule Take 1 capsule (40 mg total) by mouth daily at 12 noon. 01/31/15   Erlene Quan, PA-C  glucose blood (BL TEST STRIP PACK) test strip Use to test blood sugar 4 times daily as instructed. Dx code: 250.00 02/21/14   Philemon Kingdom, MD  hydrochlorothiazide  (HYDRODIURIL) 12.5 MG tablet Take 1 tablet (12.5 mg total) by mouth daily. 01/31/15   Erlene Quan, PA-C  insulin aspart (NOVOLOG) 100 UNIT/ML injection Inject 40 Units into the skin 3 (three) times daily before meals. before breakfast, lunch and supper    Historical Provider, MD  insulin aspart protamine- aspart (NOVOLOG MIX 70/30) (70-30) 100 UNIT/ML injection Inject 40 units into the skin before each meal and 30 units before bedtime as directed. 05/20/15   Philemon Kingdom, MD  Insulin Pen Needle 31G X 5 MM MISC Use 3x a day 03/17/15   Philemon Kingdom, MD  Insulin Syringe-Needle U-100 (BD INSULIN SYRINGE ULTRAFINE) 31G X 15/64" 0.5 ML MISC Use 3x a day 12/22/14   Philemon Kingdom, MD  Insulin Syringes, Disposable, U-100 1 ML MISC 60 Units by Does not  apply route 4 (four) times daily. 06/03/14   Philemon Kingdom, MD  Lancets [facility] ULTRASOFT) lancets Use to test blood sugar 4 times daily as instructed. Dx code: 250.00 02/21/14   Philemon Kingdom, MD  lisinopril (PRINIVIL,ZESTRIL) 20 MG tablet Take 1 tablet (20 mg total) by mouth daily. 04/06/15   Imogene Burn, PA-C  metFORMIN (GLUCOPHAGE-XR) 500 MG 24 hr tablet Take 1 tablet (500 mg total) by mouth 2 (two) times daily with a meal. 05/20/15   Philemon Kingdom, MD  metoprolol (LOPRESSOR) 50 MG tablet Take 1 tablet (50 mg total) by mouth 2 (two) times daily. 04/06/15   Imogene Burn, PA-C  nitroGLYCERIN (NITROSTAT) 0.4 MG SL tablet Place 1 tablet (0.4 mg total) under the tongue every 5 (five) minutes as needed for chest pain (CP or SOB). 01/31/15   Erlene Quan, PA-C  pravastatin (PRAVACHOL) 40 MG tablet Take 1 tablet (40 mg total) by mouth daily. 02/12/15   Rosalita Chessman, DO  propranolol (INDERAL) 40 MG tablet Take 1 tablet (40 mg total) by mouth 2 (two) times daily. Given by Dr. Posey Pronto for tremors 04/23/15   Alda Berthold, DO  ticagrelor (BRILINTA) 90 MG TABS tablet Take 1 tablet (90 mg total) by mouth 2 (two) times daily. 02/27/15   Belva Crome, MD   BP  133/58 mmHg  Pulse 77  Temp(Src) 97.9 F (36.6 C) (Oral)  Resp 19  Ht '5\' 8"'  (1.727 m)  Wt 272 lb (123.378 kg)  BMI 41.37 kg/m2  SpO2 100% Physical Exam  Constitutional: He is oriented to person, place, and time. He appears well-developed and well-nourished.  HENT:  Head: Normocephalic and atraumatic.  Eyes: Pupils are equal, round, and reactive to light.  Neck: Normal range of motion. Neck supple.  Cardiovascular: Normal rate, regular rhythm and normal heart sounds.   Pulmonary/Chest: Effort normal and breath sounds normal. No respiratory distress. He has no wheezes. He has no rales. He exhibits no tenderness.  Abdominal: Soft. Bowel sounds are normal. There is no tenderness. There is no rebound and no guarding.  Musculoskeletal: Normal range of motion. He exhibits edema.  1+ pitting edema bilaterally  Lymphadenopathy:    He has no cervical adenopathy.  Neurological: He is alert and oriented to person, place, and time.  Skin: Skin is warm and dry. No rash noted.  Psychiatric: He has a normal mood and affect.    ED Course  Procedures (including critical care time) Labs Review Labs Reviewed  CBC WITH DIFFERENTIAL/PLATELET - Abnormal; Notable for the following:    Hemoglobin 12.9 (*)    HCT 38.2 (*)    All other components within normal limits  COMPREHENSIVE METABOLIC PANEL - Abnormal; Notable for the following:    Chloride 100 (*)    Glucose, Bld 153 (*)    All other components within normal limits  TROPONIN I  BRAIN NATRIURETIC PEPTIDE    Imaging Review Dg Chest 2 View  06/04/2015   CLINICAL DATA:  Chest pain  EXAM: CHEST  2 VIEW  COMPARISON:  03/21/2015  FINDINGS: Cardiomediastinal silhouette is stable. No acute infiltrate or pleural effusion. No pulmonary edema. Mild degenerative changes thoracic spine.  IMPRESSION: No active cardiopulmonary disease.   Electronically Signed   By: Lahoma Crocker M.D.   On: 06/04/2015 11:50     EKG Interpretation   Date/Time:  Thursday  June 04 2015 11:15:35 EDT Ventricular Rate:  73 PR Interval:  232 QRS Duration: 94 QT Interval:  396  QTC Calculation: 436 R Axis:   66 Text Interpretation:  Sinus rhythm with 1st degree A-V block Anteroseptal  infarct , age undetermined Abnormal ECG since last tracing no significant  change Confirmed by Emeline Simpson  MD, Yakov Bergen ([id number]) on 06/04/2015 11:46:03 AM      MDM   Final diagnoses:  Chest pain, unspecified chest pain type    Patient presents with left-sided chest pain. It has been relieved with nitroglycerin. He is currently pain-free. He was given aspirin here in the ED. His EKG is unchanged from his last tracing. His troponin is negative. I spoke with Dr. Martinique with cardiology who has accepted the patient for transfer to [facility] for further cardiac evaluation.  15:26 patient is having recurrent episodes of chest pain. These were relieved with sublingual nitroglycerin. However now has pain come back and we will go ahead and start a nitro drip. I also gave him a heparin bolus. [facility] is here to transport so I feel that the drip can be started once he gets to [facility].  Malvin Johns, MD 06/04/15 [place], MD 06/04/15 [phone]

## 2015-06-04 NOTE — ED Notes (Signed)
Report called to [facility], Sonia Side

## 2015-06-04 NOTE — ED Notes (Signed)
Pt has taken 2 NTG and 81 mg aspirin this AM.

## 2015-06-04 NOTE — ED Notes (Signed)
Patient transported to X-ray 

## 2015-06-04 NOTE — Progress Notes (Signed)
Pre visit review using our clinic review tool, if applicable. No additional management support is needed unless otherwise documented below in the visit note. 

## 2015-06-04 NOTE — Progress Notes (Signed)
ANTICOAGULATION CONSULT NOTE - Initial Consult  Pharmacy Consult for heparin Indication: chest pain/ACS  No Known Allergies  Patient Measurements: Height: 5\' 8"  (172.7 cm) Weight: 272 lb (123.378 kg) IBW/kg (Calculated) : 68.4 Heparin Dosing Weight: 96.5 kg  Vital Signs: Temp: 98.2 F (36.8 C) (08/04 1513) Temp Source: Oral (08/04 1513) BP: 144/61 mmHg (08/04 1513) Pulse Rate: 69 (08/04 1513)  Labs:  Recent Labs  06/04/15 1120  HGB 12.9*  HCT 38.2*  PLT 248  CREATININE 0.95  TROPONINI <0.03    Estimated Creatinine Clearance: 88.5 mL/min (by C-G formula based on Cr of 0.95).   Medical History: Past Medical History  Diagnosis Date  . Hypertension   . Arthritis   . CAD (coronary artery disease)     a. Pt reports "small heart attack" in 2013 at [facility], denies need for stent or CABG, does not know findings of cath.  Marland Kitchen TIA (transient ischemic attack) 2002  . Dyslipidemia, goal LDL below 70 01/29/2014  . Heart attack 02/03/2014    "mild heart attack"  . GERD (gastroesophageal reflux disease)   . Enlarged prostate   . Shortness of breath     exertion  . Sleep apnea     does not use cpap  . Pneumonia 01/2013  . Pneumonia 03/2014    hospitalized  . DM type 2 (diabetes mellitus, type 2), insulin dependent 01/29/2014    fasting cbg 50-120 with new regimen  . Anxiety   . Headache(784.0)     Medications:   (Not in a hospital admission)  Assessment: 74 yo male admitted with chest tightness and radiation to L shoulder. Rec'd asa 324 in ED. CBC stable. Initiating heparin gtt for rule out ACS.  Goal of Therapy:  Heparin level 0.3-0.7 units/ml Monitor platelets by anticoagulation protocol: Yes   Plan:  -Heparin 4000 units bolus, then heparin gtt 1200 units/hr -Daily HL, CBC -Monitor s/sx bleeding -Check confirmatory level this evening   Hughes Better, PharmD, BCPS Clinical Pharmacist Pager: [phone] 06/04/2015 3:36 PM

## 2015-06-04 NOTE — ED Notes (Signed)
MD at bedside. 

## 2015-06-04 NOTE — Progress Notes (Addendum)
ANTICOAGULATION CONSULT NOTE - Follow Up Consult  Pharmacy Consult for Heparin  Indication: chest pain/ACS  No Known Allergies  Patient Measurements: Height: 5\' 8"  (172.7 cm) Weight: 272 lb (123.378 kg) IBW/kg (Calculated) : 68.4  Vital Signs: Temp: 97.8 F (36.6 C) (08/04 2020) Temp Source: Oral (08/04 2020) BP: 141/63 mmHg (08/04 2020) Pulse Rate: 72 (08/04 1643)  Labs:  Recent Labs  06/04/15 1120 06/04/15 1954 06/04/15 2238  HGB 12.9*  --   --   HCT 38.2*  --   --   PLT 248  --   --   HEPARINUNFRC  --   --  0.20*  CREATININE 0.95  --   --   TROPONINI <0.03 <0.03  --     Estimated Creatinine Clearance: 88.5 mL/min (by C-G formula based on Cr of 0.95).  Assessment: Initial heparin level is sub-therapeutic, no issues per RN.    Goal of Therapy:  Heparin level 0.3-0.7 units/ml Monitor platelets by anticoagulation protocol: Yes   Plan:  -Increase heparin to 1500 units/hr -0700 HL -Daily CBC/HL -Monitor for bleeding  Narda Bonds 06/04/2015,11:05 PM  ===================== Addendum 7:42 AM HL is therapeutic x 1 at 0.46 -Continue heparin at 1500 units/hr -1200 confirmatory HL JLedford, PharmD

## 2015-06-04 NOTE — ED Notes (Signed)
[facility] has left at this time. 

## 2015-06-04 NOTE — Progress Notes (Signed)
Patient ID: Edward [name], male    DOB: 1941-03-25  Age: 74 y.o. MRN: [id number]    Subjective:  Subjective HPI [name] presents for f/u and c/o chest pain this am that was still present when he came in this am but has now resolved.  He took NTG this am and tums with no relief.  He described pain as squeezing pain.  No sob presently.    Review of Systems  Constitutional: Negative for diaphoresis, appetite change, fatigue and unexpected weight change.  Eyes: Negative for pain, redness and visual disturbance.  Respiratory: Negative for cough, chest tightness, shortness of breath and wheezing.   Cardiovascular: Positive for chest pain. Negative for palpitations and leg swelling.  Endocrine: Negative for cold intolerance, heat intolerance, polydipsia, polyphagia and polyuria.  Genitourinary: Negative for dysuria, frequency and difficulty urinating.  Neurological: Negative for dizziness, light-headedness, numbness and headaches.    History Past Medical History  Diagnosis Date  . Hypertension   . Arthritis   . CAD (coronary artery disease)     a. Pt reports "small heart attack" in 2013 at [facility], denies need for stent or CABG, does not know findings of cath.  Marland Kitchen TIA (transient ischemic attack) 2002  . Dyslipidemia, goal LDL below 70 01/29/2014  . Heart attack 02/03/2014    "mild heart attack"  . GERD (gastroesophageal reflux disease)   . Enlarged prostate   . Shortness of breath     exertion  . Sleep apnea     does not use cpap  . Pneumonia 01/2013  . Pneumonia 03/2014    hospitalized  . DM type 2 (diabetes mellitus, type 2), insulin dependent 01/29/2014    fasting cbg 50-120 with new regimen  . Anxiety   . Headache(784.0)     He has past surgical history that includes Total knee arthroplasty (Bilateral); Cardiac catheterization (01/28/14); Lumbar disc surgery; Cataract extraction (Bilateral); Joint replacement (Bilateral); Back surgery; Eye surgery  (Bilateral); Shoulder arthroscopy (Right, 07/30/2014); left heart catheterization with coronary angiogram (N/A, 01/28/2014); Coronary stent placement (01/30/2015); and left heart catheterization with coronary angiogram (N/A, 01/30/2015).   His family history includes Alzheimer's disease in his sister; CAD in his brother and sister; Diabetes in his other; Prostate cancer in his brother.He reports that he has never smoked. He has never used smokeless tobacco. He reports that he does not drink alcohol or use illicit drugs.  Current Outpatient Prescriptions on File Prior to Visit  Medication Sig Dispense Refill  . amLODipine (NORVASC) 10 MG tablet Take 1 tablet (10 mg total) by mouth daily. 30 tablet 11  . aspirin EC 81 MG tablet Take 81 mg by mouth daily.    . B-D INS SYR ULTRAFINE 1CC/30G 30G X 1/2" 1 ML MISC   0  . B-D INS SYR ULTRAFINE 1CC/31G 31G X 5/16" 1 ML MISC   0  . Blood Glucose Monitoring Suppl (ONE TOUCH ULTRA SYSTEM KIT) W/DEVICE KIT Use to test blood sugar daily as instructed. Dx code: E11.59 1 each 0  . Ciclopirox 1 % shampoo Apply 1 application topically every other day. At night after showering  0  . clonazePAM (KLONOPIN) 1 MG tablet Take 1 tablet (1 mg total) by mouth at bedtime as needed for anxiety. 30 tablet 0  . esomeprazole (NEXIUM) 40 MG capsule Take 1 capsule (40 mg total) by mouth daily at 12 noon. 30 capsule 11  . glucose blood (BL TEST STRIP PACK) test strip Use to test blood  sugar 4 times daily as instructed. Dx code: 250.00 200 each 4  . hydrochlorothiazide (HYDRODIURIL) 12.5 MG tablet Take 1 tablet (12.5 mg total) by mouth daily. 30 tablet 11  . insulin aspart (NOVOLOG) 100 UNIT/ML injection Inject 40 Units into the skin 3 (three) times daily before meals. before breakfast, lunch and supper    . insulin aspart protamine- aspart (NOVOLOG MIX 70/30) (70-30) 100 UNIT/ML injection Inject 40 units into the skin before each meal and 30 units before bedtime as directed. 60 mL 1  .  Insulin Pen Needle 31G X 5 MM MISC Use 3x a day 100 each 11  . Insulin Syringe-Needle U-100 (BD INSULIN SYRINGE ULTRAFINE) 31G X 15/64" 0.5 ML MISC Use 3x a day 100 each 3  . Insulin Syringes, Disposable, U-100 1 ML MISC 60 Units by Does not apply route 4 (four) times daily. 120 each 2  . Lancets (ONETOUCH ULTRASOFT) lancets Use to test blood sugar 4 times daily as instructed. Dx code: 250.00 200 each 4  . lisinopril (PRINIVIL,ZESTRIL) 20 MG tablet Take 1 tablet (20 mg total) by mouth daily. 30 tablet 6  . metFORMIN (GLUCOPHAGE-XR) 500 MG 24 hr tablet Take 1 tablet (500 mg total) by mouth 2 (two) times daily with a meal. 30 tablet 2  . metoprolol (LOPRESSOR) 50 MG tablet Take 1 tablet (50 mg total) by mouth 2 (two) times daily. 60 tablet 6  . nitroGLYCERIN (NITROSTAT) 0.4 MG SL tablet Place 1 tablet (0.4 mg total) under the tongue every 5 (five) minutes as needed for chest pain (CP or SOB). 25 tablet 4  . pravastatin (PRAVACHOL) 40 MG tablet Take 1 tablet (40 mg total) by mouth daily. 30 tablet 5  . propranolol (INDERAL) 40 MG tablet Take 1 tablet (40 mg total) by mouth 2 (two) times daily. Given by Dr. Posey Pronto for tremors 60 tablet 5  . ticagrelor (BRILINTA) 90 MG TABS tablet Take 1 tablet (90 mg total) by mouth 2 (two) times daily. 180 tablet 3   No current facility-administered medications on file prior to visit.     Objective:  Objective Physical Exam  Constitutional: He is oriented to person, place, and time. Vital signs are normal. He appears well-developed and well-nourished. He is sleeping.  HENT:  Head: Normocephalic and atraumatic.  Mouth/Throat: Oropharynx is clear and moist.  Eyes: EOM are normal. Pupils are equal, round, and reactive to light.  Neck: Normal range of motion. Neck supple. No thyromegaly present.  Cardiovascular: Normal rate and regular rhythm.   No murmur heard. Pulmonary/Chest: Effort normal and breath sounds normal. No respiratory distress. He has no wheezes. He  has no rales. He exhibits no tenderness.  Musculoskeletal: He exhibits no edema or tenderness.  Neurological: He is alert and oriented to person, place, and time.  Skin: Skin is warm and dry.  Psychiatric: He has a normal mood and affect. His behavior is normal. Judgment and thought content normal.   BP 154/72 mmHg  Pulse 74  Temp(Src) 98.4 F (36.9 C) (Oral)  Ht '5\' 8"'  (1.727 m)  Wt 274 lb 6.4 oz (124.467 kg)  BMI 41.73 kg/m2  SpO2 98% Wt Readings from Last 3 Encounters:  06/04/15 274 lb 6.4 oz (124.467 kg)  05/20/15 269 lb (122.018 kg)  04/23/15 272 lb (123.378 kg)     Lab Results  Component Value Date   WBC 5.9 03/21/2015   HGB 12.2* 03/21/2015   HCT 37.2* 03/21/2015   PLT 212 03/21/2015   GLUCOSE  269* 03/21/2015   CHOL 155 02/09/2015   TRIG 83.0 02/09/2015   HDL 38.00* 02/09/2015   LDLCALC 100* 02/09/2015   ALT 15 02/09/2015   AST 17 02/09/2015   NA 139 03/21/2015   K 4.1 03/21/2015   CL 104 03/21/2015   CREATININE 1.11 03/21/2015   BUN 13 03/21/2015   CO2 27 03/21/2015   TSH 1.041 12/02/2014   PSA 0.74 02/11/2014   INR 0.9 01/27/2015   HGBA1C 10.5 05/20/2015   MICROALBUR 91.9* 02/11/2014    Dg Chest Port 1 View  03/21/2015   CLINICAL DATA:  Patient with sub sternal chest pain.  EXAM: PORTABLE CHEST - 1 VIEW  COMPARISON:  Chest radiograph 05/07/2014  FINDINGS: Low lung volumes. Stable enlarged cardiac and mediastinal contours. No consolidative pulmonary opacities. Probable minimal bibasilar atelectasis. Degenerative changes bilateral shoulder joints.  IMPRESSION: Low lung volumes with bibasilar atelectasis.   Electronically Signed   By: Lovey Newcomer M.D.   On: 03/21/2015 17:26     Assessment & Plan:  Plan I am having Mr. Granville maintain his aspirin EC, glucose blood, onetouch ultrasoft, Insulin Syringes (Disposable), Insulin Syringe-Needle U-100, amLODipine, ONE TOUCH ULTRA SYSTEM KIT, nitroGLYCERIN, hydrochlorothiazide, esomeprazole, Ciclopirox, B-D INS SYR  ULTRAFINE 1CC/31G, B-D INS SYR ULTRAFINE 1CC/30G, pravastatin, ticagrelor, Insulin Pen Needle, insulin aspart, clonazePAM, lisinopril, metoprolol, propranolol, metFORMIN, and insulin aspart protamine- aspart.  No orders of the defined types were placed in this encounter.    Problem List Items Addressed This Visit    None    Visit Diagnoses    Chest pain, unspecified chest pain type    -  Primary    Relevant Orders    EKG 12-Lead (Completed)     ekg-poor r wave progression Pt came to office with squeezing chest pain---- sent to ER for further evaluation --- chest pain had resolved but returned as he was being taken down to eR Follow-up: No Follow-up on file.  Garnet Koyanagi, DO

## 2015-06-04 NOTE — ED Notes (Signed)
Pt has been assigned a bed, no transport available at this time

## 2015-06-04 NOTE — H&P (Signed)
Patient ID: Edward [name] MRN: [id number], DOB/AGE: [birth year]   Admit date: 06/04/2015   Primary Physician: Garnet Koyanagi, DO Primary Cardiologist: Dr. Tamala Julian  Pt. Profile:   74 year old male with past medical history of HTN, HLD, DM, OSA not on CPAP, severe LVH and CAD s/p DES to LAD in 01/30/2015 presented with CP and SOB.   Problem List  Past Medical History  Diagnosis Date  . Hypertension   . CAD (coronary artery disease)     a. Pt reports "small heart attack" in 2013 at [facility], denies need for stent or CABG, does not know findings of cath.  Marland Kitchen TIA (transient ischemic attack) 2002  . Dyslipidemia, goal LDL below 70 01/29/2014  . GERD (gastroesophageal reflux disease)   . Enlarged prostate   . Shortness of breath     exertion  . DM type 2 (diabetes mellitus, type 2), insulin dependent 01/29/2014    fasting cbg 50-120 with new regimen  . Anxiety   . Heart attack 02/03/2014    "mild heart attack"  . Pneumonia 01/2013  . Pneumonia 03/2014    hospitalized  . Sleep apnea     does not use cpap (06/04/2015)  . ITGPQDIY(641.5)     "monthly" (06/04/2015)  . Arthritis     'all over"  . Tremor of both hands     Past Surgical History  Procedure Laterality Date  . Total knee arthroplasty Bilateral   . Lumbar disc surgery    . Cataract extraction Bilateral   . Joint replacement    . Back surgery    . Cataract extraction, bilateral Bilateral   . Shoulder arthroscopy Right 07/30/2014    Procedure: Right Shoulder Arthroscopy, Debridement, Decompression, Manipulation Under Anesthesia;  Surgeon: Newt Minion, MD;  Location: [place];  Service: Orthopedics;  Laterality: Right;  . Left heart catheterization with coronary angiogram N/A 01/28/2014    Procedure: LEFT HEART CATHETERIZATION WITH CORONARY ANGIOGRAM;  Surgeon: Sinclair Grooms, MD;  Location: [facility] CATH LAB;  Service: Cardiovascular;  Laterality: N/A;  . Left heart catheterization with coronary angiogram N/A 01/30/2015      Procedure: LEFT HEART CATHETERIZATION WITH CORONARY ANGIOGRAM;  Surgeon: Belva Crome, MD;  Location: [facility] C[facility] LAB;  Service: Cardiovascular;  Laterality: N/A;  . Cardiac catheterization  01/28/14    + CAD treat medically  . Coronary angioplasty with stent placement  01/30/2015    DES Promus  Premier to LAD by Dr Tamala Julian     Allergies  No Known Allergies  HPI  This is a 74 year old male with past medical history of HTN, HLD, DM, OSA not on CPAP, severe LVH and CAD s/p DES to LAD in 01/30/2015. According to the past medical record, patient had a mini heart attack at [facility] in 2013. 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 He had cardiac catheterization in March 2015 which showed 50% mid LAD stenosis treated medically. He underwent a Myoview for exertional symptom on 01/20/2015 which showed anteroapical partially reversible defect. He underwent cardiac catheterization by Dr. Tamala Julian on 01/30/2015 which showed 40% proximal to mid LAD, 85% mid LAD stenosis treated with Promus Premier DES, 50-70% distal LAD stenosis.   He was doing well after cath for a while. Started on propranolol for tremors in May. He has been taking both metoprolol and propranolol at home. For the past 2 weeks, he started having  increasing shortness of breath that wakes him up from sleep at night. He also admits to have occasional lower extremity edema. He has a nonproductive cough. For the past week, he also noticed intermittent substernal chest pressure associated with shortness of breath radiating to the right jaw. It mainly occur in the morning. It happens both at rest and with exertion. Interestingly enough, when he does nighttime exercise by walking around in the house, he denies any exertional chest discomfort or shortness breath. He woke up around 7:30 AM on 06/04/2015 with shortness of breath  and chest discomfort. He was seen by his PCP today who obtained a EKG which showed poor R wave progression in anterior lead. He was sent to the [facility] EKG obtained at medicine [facility] continued to show poor progression in anterior lead. Troponin was negative. BNP was only 35. Chest x-ray was negative. Patient was transferred to [facility] for cardiology service to admit.    Home Medications  Prior to Admission medications   Medication Sig Start Date End Date Taking? Authorizing Provider  amLODipine (NORVASC) 10 MG tablet Take 1 tablet (10 mg total) by mouth daily. 12/29/14  Yes Rosalita Chessman, DO  aspirin EC 81 MG tablet Take 81 mg by mouth daily.   Yes Historical Provider, MD  Ciclopirox 1 % shampoo Apply 1 application topically at bedtime. At night after showering 11/20/14  Yes Historical Provider, MD  clonazePAM (KLONOPIN) 1 MG tablet Take 1 tablet (1 mg total) by mouth at bedtime as needed for anxiety. 04/02/15  Yes Yvonne R Lowne, DO  esomeprazole (NEXIUM) 40 MG capsule Take 1 capsule (40 mg total) by mouth daily at 12 noon. Patient taking differently: Take 40 mg by mouth daily as needed (acid reflux).  01/31/15  Yes Luke K Kilroy, PA-C  hydrochlorothiazide (HYDRODIURIL) 12.5 MG tablet Take 1 tablet (12.5 mg total) by mouth daily. Patient taking differently: Take 25 mg by mouth daily.  01/31/15  Yes Luke K Kilroy, PA-C  insulin aspart (NOVOLOG) 100 UNIT/ML injection Inject 40 Units into the skin 3 (three) times daily before meals. before breakfast, lunch and supper   Yes Historical Provider, MD  insulin aspart protamine- aspart (NOVOLOG MIX 70/30) (70-30) 100 UNIT/ML injection Inject 40 units into the skin before each meal and 30 units before bedtime as directed. 05/20/15  Yes Philemon Kingdom, MD  lisinopril (PRINIVIL,ZESTRIL) 20 MG tablet Take 1 tablet (20 mg total) by mouth daily. 04/06/15  Yes Imogene Burn, PA-C  metFORMIN (GLUCOPHAGE-XR) 500 MG 24 hr tablet Take 1  tablet (500 mg total) by mouth 2 (two) times daily with a meal. 05/20/15  Yes Philemon Kingdom, MD  metoprolol (LOPRESSOR) 50 MG tablet Take 1 tablet (50 mg total) by mouth 2 (two) times daily. 04/06/15  Yes Imogene Burn, PA-C  nitroGLYCERIN (NITROSTAT) 0.4 MG SL tablet Place 1 tablet (0.4 mg total) under the tongue every 5 (five) minutes as needed for chest pain (CP or SOB). 01/31/15  Yes Luke K Kilroy, PA-C  Polyvinyl Alcohol-Povidone (REFRESH OP) Place 1 drop into both eyes daily as needed (for dry eyes).   Yes Historical Provider, MD  pravastatin (PRAVACHOL) 40 MG tablet Take 1 tablet (40 mg total) by mouth daily. 02/12/15  Yes Yvonne R Lowne, DO  propranolol (INDERAL) 40 MG tablet Take 1 tablet (40 mg total) by mouth 2 (two) times daily. Given by Dr. Posey Pronto for tremors 04/23/15  Yes Alda Berthold, DO  ticagrelor (BRILINTA) 90  MG TABS tablet Take 1 tablet (90 mg total) by mouth 2 (two) times daily. 02/27/15  Yes Belva Crome, MD  B-D INS SYR ULTRAFINE 1CC/30G 30G X 1/2" 1 ML MISC  11/01/14   Historical Provider, MD  B-D INS SYR ULTRAFINE 1CC/31G 31G X 5/16" 1 ML MISC  01/06/15   Historical Provider, MD  Blood Glucose Monitoring Suppl (ONE TOUCH ULTRA SYSTEM KIT) W/DEVICE KIT Use to test blood sugar daily as instructed. Dx code: E11.59 12/30/14   Philemon Kingdom, MD  glucose blood (BL TEST STRIP PACK) test strip Use to test blood sugar 4 times daily as instructed. Dx code: 250.00 02/21/14   Philemon Kingdom, MD  Insulin Pen Needle 31G X 5 MM MISC Use 3x a day 03/17/15   Philemon Kingdom, MD  Insulin Syringe-Needle U-100 (BD INSULIN SYRINGE ULTRAFINE) 31G X 15/64" 0.5 ML MISC Use 3x a day 12/22/14   Philemon Kingdom, MD  Insulin Syringes, Disposable, U-100 1 ML MISC 60 Units by Does not apply route 4 (four) times daily. 06/03/14   Philemon Kingdom, MD  Lancets [facility] ULTRASOFT) lancets Use to test blood sugar 4 times daily as instructed. Dx code: 250.00 02/21/14   Philemon Kingdom, MD    Family  History  Family History  Problem Relation Age of Onset  . CAD Brother     2 brothers - CABG  . Alzheimer's disease Sister   . CAD Sister   . Prostate cancer Brother   . Diabetes Other     entire family    Social History  History   Social History  . Marital Status: Married    Spouse Name: N/A  . Number of Children: 0  . Years of Education: N/A   Occupational History  . retired    Social History Main Topics  . Smoking status: Never Smoker   . Smokeless tobacco: Never Used  . Alcohol Use: No  . Drug Use: No  . Sexual Activity: No   Other Topics Concern  . 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   He lives alone.  His wife is living in [place] at this time.  They do not have children.   Highest level of education:  College graduate     Review of Systems General:  No chills, fever, night sweats or weight changes.  Cardiovascular:  No orthopnea, palpitationsa. +chest pain, dyspnea on exertion, edema, PND Dermatological: No rash, lesions/masses Respiratory: +nonproductive cough, dyspnea Urologic: No hematuria, dysuria Abdominal:   No nausea, vomiting, diarrhea, bright red blood per rectum, melena, or hematemesis Neurologic:  No visual changes, wkns, changes in mental status. All other systems reviewed and are otherwise negative except as noted above.  Physical Exam  Blood pressure 137/82, pulse 72, temperature 98.2 F (36.8 C), temperature source Oral, resp. rate 16, height '5\' 8"'  (1.727 m), weight 272 lb (123.378 kg), SpO2 98 %.  General: Pleasant, NAD Psych: Normal affect. Neuro: Alert and oriented X 3. Moves all extremities spontaneously. HEENT: Normal  Neck: Supple without bruits or JVD. Lungs:  Resp regular and unlabored, CTA. Heart: RRR no s3, s4, or murmurs. Abdomen: Soft, non-tender, non-distended, BS + x 4.  Extremities: No clubbing, cyanosis or edema.  DP/PT/Radials 2+ and equal bilaterally.  Labs  Troponin (Point of Care Test) No results for input(s): TROPIPOC in the last 72 hours.  Recent Labs  06/04/15 1120  TROPONINI <0.03   Lab Results  Component Value Date   WBC 6.8 06/04/2015   HGB 12.9* 06/04/2015   HCT 38.2* 06/04/2015   MCV 84.0 06/04/2015   PLT 248 06/04/2015    Recent Labs Lab 06/04/15 1120  NA 139  K 3.5  CL 100*  CO2 32  BUN 14  CREATININE 0.95  CALCIUM 9.3  PROT 6.8  BILITOT 0.5  ALKPHOS 95  ALT 18  AST 18  GLUCOSE 153*   Lab Results  Component Value Date   CHOL 155 02/09/2015   HDL 38.00* 02/09/2015   LDLCALC 100* 02/09/2015   TRIG 83.0 02/09/2015   Lab Results  Component Value Date   DDIMER 0.34 03/21/2015     Radiology/Studies  Dg Chest 2 View  06/04/2015   CLINICAL DATA:  Chest pain  EXAM: CHEST  2 VIEW  COMPARISON:  03/21/2015  FINDINGS: Cardiomediastinal silhouette is stable. No acute infiltrate or pleural effusion. No pulmonary edema. Mild degenerative changes thoracic spine.  IMPRESSION: No active cardiopulmonary disease.   Electronically Signed   By: Lahoma Crocker M.D.   On: 06/04/2015 11:50    ECG  Normal sinus rhythm with poor progression anteriorly, unchanged compared to the previous EKG in May. No significant ST-T wave changes.  Echocardiogram  - Left ventricle: The cavity size was normal. 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 Systolic function was normal. The estimated ejection fraction was in the range of 60% to 65%. Wall motion was normal; there were no regional wall motion abnormalities. Doppler parameters are consistent with abnormal left ventricular relaxation (grade 1 diastolic dysfunction). The E/e' ratio is <10, suggesting normal LV filling pressure. - Left atrium: The atrium was normal in size. - Right ventricle: The cavity size was normal. There is borderline RVH. Systolic function was normal. -  Right atrium: The atrium was at the upper limits of normal in size. - Pericardium, extracardiac: There was no pericardial effusion.    ASSESSMENT AND PLAN  1. Chest pain associated with shortness of breath, occur both at rest and with exertion  - Some atypical features, for trend troponin overnight  - Myoview in AM assume troponin negative.   - Change Brilinta to Plavix. Hold propranolol.   - continue IV nitroglycerin for now   - despite having PND symptom, no sign HF, ?if related to OSA. Check d-dimer   2. CAD s/p DES to LAD in 01/30/2015  - Obtain echo  3. HTN  4. HLD  5. DM  6. OSA not on CPAP  7. severe LVH, ?global variant HCM on echo in 2015   Signed, Almyra Deforest, [place] 06/04/2015, 6:02 PM  Personally seen and examined. Agree with above. Recent stent placement April 2016.  Has both typical as well as atypical features to his chest pain.  If troponin normal, nuclear stress test.  We will also check echocardiogram given recent shortness of breath.  Since he has had 4 months of Brilinta, also with symptom of shortness of breath which can sometimes be associated with this medication, we will change to Plavix.  BNP is normal.  Candee Furbish, MD

## 2015-06-05 ENCOUNTER — Observation Stay (HOSPITAL_COMMUNITY): Payer: Medicare Other

## 2015-06-05 ENCOUNTER — Observation Stay (HOSPITAL_COMMUNITY): Payer: Federal, State, Local not specified - PPO

## 2015-06-05 DIAGNOSIS — Z955 Presence of coronary angioplasty implant and graft: Secondary | ICD-10-CM | POA: Diagnosis not present

## 2015-06-05 DIAGNOSIS — Z6841 Body Mass Index (BMI) 40.0 and over, adult: Secondary | ICD-10-CM | POA: Diagnosis not present

## 2015-06-05 DIAGNOSIS — I10 Essential (primary) hypertension: Secondary | ICD-10-CM | POA: Diagnosis not present

## 2015-06-05 DIAGNOSIS — I2510 Atherosclerotic heart disease of native coronary artery without angina pectoris: Secondary | ICD-10-CM | POA: Diagnosis not present

## 2015-06-05 DIAGNOSIS — E119 Type 2 diabetes mellitus without complications: Secondary | ICD-10-CM | POA: Diagnosis not present

## 2015-06-05 DIAGNOSIS — I252 Old myocardial infarction: Secondary | ICD-10-CM | POA: Diagnosis not present

## 2015-06-05 DIAGNOSIS — Z9842 Cataract extraction status, left eye: Secondary | ICD-10-CM | POA: Diagnosis not present

## 2015-06-05 DIAGNOSIS — R072 Precordial pain: Secondary | ICD-10-CM

## 2015-06-05 DIAGNOSIS — R0602 Shortness of breath: Secondary | ICD-10-CM | POA: Diagnosis not present

## 2015-06-05 DIAGNOSIS — G4730 Sleep apnea, unspecified: Secondary | ICD-10-CM | POA: Diagnosis not present

## 2015-06-05 DIAGNOSIS — K219 Gastro-esophageal reflux disease without esophagitis: Secondary | ICD-10-CM | POA: Diagnosis not present

## 2015-06-05 DIAGNOSIS — E785 Hyperlipidemia, unspecified: Secondary | ICD-10-CM | POA: Diagnosis not present

## 2015-06-05 DIAGNOSIS — Z96653 Presence of artificial knee joint, bilateral: Secondary | ICD-10-CM | POA: Diagnosis not present

## 2015-06-05 DIAGNOSIS — R079 Chest pain, unspecified: Secondary | ICD-10-CM | POA: Diagnosis not present

## 2015-06-05 LAB — NM MYOCAR MULTI W/SPECT W/WALL MOTION / EF
CHL CUP RESTING HR STRESS: 83 {beats}/min
Peak HR: 88 {beats}/min

## 2015-06-05 LAB — BASIC METABOLIC PANEL
ANION GAP: 8 (ref 5–15)
BUN: 9 mg/dL (ref 6–20)
CALCIUM: 8.5 mg/dL — AB (ref 8.9–10.3)
CO2: 29 mmol/L (ref 22–32)
Chloride: 100 mmol/L — ABNORMAL LOW (ref 101–111)
Creatinine, Ser: 1.07 mg/dL (ref 0.61–1.24)
Glucose, Bld: 299 mg/dL — ABNORMAL HIGH (ref 65–99)
Potassium: 4 mmol/L (ref 3.5–5.1)
Sodium: 137 mmol/L (ref 135–145)

## 2015-06-05 LAB — D-DIMER, QUANTITATIVE: D-Dimer, Quant: <0.27 ug/mL-FEU (ref 0.00–0.48)

## 2015-06-05 LAB — HEPARIN LEVEL (UNFRACTIONATED)
HEPARIN UNFRACTIONATED: 0.46 [IU]/mL (ref 0.30–0.70)
Heparin Unfractionated: 0.42 IU/mL (ref 0.30–0.70)

## 2015-06-05 LAB — CBC
HEMATOCRIT: 35.6 % — AB (ref 39.0–52.0)
Hemoglobin: 11.9 g/dL — ABNORMAL LOW (ref 13.0–17.0)
MCH: 28.2 pg (ref 26.0–34.0)
MCHC: 33.4 g/dL (ref 30.0–36.0)
MCV: 84.4 fL (ref 78.0–100.0)
Platelets: 212 10*3/uL (ref 150–400)
RBC: 4.22 MIL/uL (ref 4.22–5.81)
RDW: 13.6 % (ref 11.5–15.5)
WBC: 6.9 10*3/uL (ref 4.0–10.5)

## 2015-06-05 LAB — GLUCOSE, CAPILLARY
GLUCOSE-CAPILLARY: 319 mg/dL — AB (ref 65–99)
Glucose-Capillary: 264 mg/dL — ABNORMAL HIGH (ref 65–99)
Glucose-Capillary: 321 mg/dL — ABNORMAL HIGH (ref 65–99)
Glucose-Capillary: 295 mg/dL — ABNORMAL HIGH (ref 65–99)

## 2015-06-05 LAB — TROPONIN I
Troponin I: <0.03 ng/mL (ref <0.031)
Troponin I: <0.03 ng/mL (ref <0.031)

## 2015-06-05 MED ORDER — TECHNETIUM TC 99M SESTAMIBI GENERIC - CARDIOLITE
30.0000 | Freq: Once | INTRAVENOUS | Status: AC | PRN
  Administered 2015-06-05: 30 via INTRAVENOUS

## 2015-06-05 MED ORDER — LORAZEPAM 0.5 MG PO TABS
0.5000 mg | ORAL_TABLET | Freq: Three times a day (TID) | ORAL | Status: DC | PRN
  Administered 2015-06-05: 0.5 mg via ORAL
  Filled 2015-06-05: qty 1

## 2015-06-05 MED ORDER — TECHNETIUM TC 99M SESTAMIBI GENERIC - CARDIOLITE
10.0000 | Freq: Once | INTRAVENOUS | Status: AC | PRN
  Administered 2015-06-05: 10 via INTRAVENOUS

## 2015-06-05 MED ORDER — CLOPIDOGREL BISULFATE 75 MG PO TABS: 75.0000 mg | ORAL_TABLET | Freq: Every day | ORAL | Status: DC

## 2015-06-05 MED ORDER — HYDROCHLOROTHIAZIDE 12.5 MG PO TABS: 25.0000 mg | ORAL_TABLET | Freq: Every day | ORAL | Status: DC

## 2015-06-05 MED ORDER — REGADENOSON 0.4 MG/5ML IV SOLN
0.4000 mg | Freq: Once | INTRAVENOUS | Status: AC
  Administered 2015-06-05: 0.4 mg via INTRAVENOUS
  Filled 2015-06-05: qty 5

## 2015-06-05 MED ORDER — KETOROLAC TROMETHAMINE 15 MG/ML IJ SOLN
15.0000 mg | Freq: Once | INTRAMUSCULAR | Status: AC
  Administered 2015-06-05: 15 mg via INTRAVENOUS
  Filled 2015-06-05: qty 1

## 2015-06-05 MED ORDER — LORAZEPAM 0.5 MG PO TABS
0.5000 mg | ORAL_TABLET | Freq: Once | ORAL | Status: AC
  Administered 2015-06-05: 0.5 mg via ORAL
  Filled 2015-06-05: qty 1

## 2015-06-05 MED ORDER — INSULIN ASPART 100 UNIT/ML ~~LOC~~ SOLN
25.0000 [IU] | Freq: Three times a day (TID) | SUBCUTANEOUS | Status: DC
  Administered 2015-06-05 (×2): 25 [IU] via SUBCUTANEOUS

## 2015-06-05 MED ORDER — REGADENOSON 0.4 MG/5ML IV SOLN
INTRAVENOUS | Status: AC
  Administered 2015-06-05: 0.4 mg via INTRAVENOUS
  Filled 2015-06-05: qty 5

## 2015-06-05 NOTE — Progress Notes (Signed)
ANTICOAGULATION CONSULT NOTE - Follow Up Consult  Pharmacy Consult for heparin Indication: chest pain/ACS  No Known Allergies  Patient Measurements: Height: 5\' 8"  (172.7 cm) Weight: 271 lb 1.6 oz (122.97 kg) IBW/kg (Calculated) : 68.4  Heparin dosing weight 97 kg  Vital Signs: Temp: 98.7 F (37.1 C) (08/05 1411) Temp Source: Oral (08/05 1411) BP: 148/58 mmHg (08/05 1411) Pulse Rate: 86 (08/05 1411)  Labs:  Recent Labs  06/04/15 1120 06/04/15 1954 06/04/15 2238 06/05/15 0149 06/05/15 0703 06/05/15 0728 06/05/15 1720  HGB 12.9*  --   --  11.9*  --   --   --   HCT 38.2*  --   --  35.6*  --   --   --   PLT 248  --   --  212  --   --   --   HEPARINUNFRC  --   --  0.20*  --  0.46  --  0.42  CREATININE 0.95  --   --   --   --  1.07  --   TROPONINI <0.03 <0.03  --  <0.03  --  <0.03  --     Estimated Creatinine Clearance: 78.4 mL/min (by C-G formula based on Cr of 1.07).   Assessment: 74 yo male admitted with chest tightness and radiation to L shoulder. Initiated heparin to rule out ACS. Pt was ruled out for MI but pain is still 8/10. Pain did not get worse during stress test. Confirmatory HL remains therapeutic at 0.42. Of note patient was also on Brilinta but changed to Plavix. Plan is to discharge patient home but will continue heparin gtt for now.  Goal of Therapy:  Heparin level 0.3-0.7 units/ml Monitor platelets by anticoagulation protocol: Yes   Plan:  Continue heparin gtt at 1500 units/hr Monitor daily HL, CBC, s/s of bleed   Anaira Seay J 06/05/2015,6:01 PM

## 2015-06-05 NOTE — Progress Notes (Signed)
Inpatient Diabetes Program Recommendations  AACE/ADA: New Consensus Statement on Inpatient Glycemic Control (2013)  Target Ranges:  Prepandial:   less than 140 mg/dL      Peak postprandial:   less than 180 mg/dL (1-2 hours)      Critically ill patients:  140 - 180 mg/dL   Reason for Admission: CP/SOB  Diabetes history: DM 2 sees Dr. Benjiman Core (Endocrinologist) Outpatient Diabetes medications: Novolin 70/30 40 units TID and if CBG is > 200mg /dl take 30 units QHS, Novolin 30 units TID meal coverage Current orders for Inpatient glycemic control: Novolog 40 units TID  Inpatient Diabetes Program Recommendations  Insulin - Basal: Patient takes Novolin 70/30 40 units TID and 30 units at HS if glucose is >200 mg/dl. Please order Novolog 70/30 30-40 units TID. Correction (SSI): Patient takes Novolin (short acting only) 30 units TID for meal coverage. Please consider reducing Novolog dose to Novolog 20-30 units TID since most patients have less intake then at home.  Thanks,  Tama Headings RN, MSN, [facility] Inpatient Diabetes Coordinator Team Pager [phone]

## 2015-06-05 NOTE — Discharge Summary (Signed)
Physician Discharge Summary     Cardiologist:  Tamala Julian Patient ID: Edward [name] MRN: [id number] DOB/AGE: 01-03-1941 74 y.o.  Admit date: 06/04/2015 Discharge date: 06/05/2015  Admission Diagnoses:  Chest Pain  Discharge Diagnoses:  Active Problems:   CAD S/P LAD DES 01/30/15   Dyslipidemia, goal LDL below 70   DM type 2 (diabetes mellitus, type 2), insulin dependent   HTN (hypertension)   Severe obesity (BMI >= 40)   Chest pain   Discharged Condition: stable  Hospital Course:   This is a 74 year old male with past medical history of HTN, HLD, DM, OSA not on CPAP, severe LVH and CAD s/p DES to LAD in 01/30/2015. According to the past medical record, patient had a mini heart attack at [facility] in 2013. 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 He had cardiac catheterization in March 2015 which showed 50% mid LAD stenosis treated medically. He underwent a Myoview for exertional symptom on 01/20/2015 which showed anteroapical partially reversible defect. He underwent cardiac catheterization by Dr. Tamala Julian on 01/30/2015 which showed 40% proximal to mid LAD, 85% mid LAD stenosis treated with Promus Premier DES, 50-70% distal LAD stenosis.   He was doing well after cath for a while. Started on propranolol for tremors in May. He has been taking both metoprolol and propranolol at home. For the past 2 weeks, he started having increasing shortness of breath that wakes him up from sleep at night. He also admits to have occasional lower extremity edema. He has a nonproductive cough. For the past week, he also noticed intermittent substernal chest pressure associated with shortness of breath radiating to the right jaw. It mainly occur in the morning. It happens both at rest and with exertion. Interestingly enough, when he does nighttime exercise by walking around in the house, he  denies any exertional chest discomfort or shortness breath. He woke up around 7:30 AM on 06/04/2015 with shortness of breath and chest discomfort. He was seen by his PCP today who obtained a EKG which showed poor R wave progression in anterior lead. He was sent to the [facility]. EKG obtained at medicine [facility] continued to show poor progression in anterior lead. Troponin was negative. BNP was only 35. Chest x-ray was negative. Patient was transferred to [facility] for cardiology service to admit.  He was started on IV heparin and IV NTG.  He ruled out for MI.  He underwent nuclear stress imaging which revealed a small and mild fixed apical defect and normal left ventricular regional and global systolic function. There was no reversible ischemia. The study was considered low risk. The size and severity of apical defect was substantially improved compared to prior study in March. Patient was also switched from Brilinta to Plavix case he was having a side effect from the [place]. We will continue this until at least seen in follow-up at which time we can determine if we can switch him back or not. Propranolol was also discontinued. He is already on metoprolol 50 mg twice daily.  He was also given 15 mg of IV Toradol which seemed to help his chest discomfort.  The patient was seen by Dr. Angelena Form who felt he was stable for DC home.    Consults:  None  Significant Diagnostic Studies:   Nuclear stress imaging- Study Result      There was no ST segment deviation noted during  stress.  The study is normal.  This is a low risk study.  The left ventricular ejection fraction is normal (55-65%).  Nuclear stress EF: 67%.  Defect 1: There is a small defect of mild severity present in the apex location.  Findings consistent with prior myocardial infarction.  Low risk stress nuclear study with a small and mild fixed apical defect and normal left ventricular regional and global  systolic function. No reversible ischemia is identified. The size and severity of the apical defect are substantially improved compared to the January 19, 2015 study.    Treatments: See above  Discharge Exam: Blood pressure 148/58, pulse 86, temperature 98.7 F (37.1 C), temperature source Oral, resp. rate 20, height $RemoveBe'5\' 8"'riUJZAcFP$  (1.727 m), weight 271 lb 1.6 oz (122.97 kg), SpO2 98 %.   Disposition: 01-Home or Self Care      Discharge Instructions    Diet - low sodium heart healthy    Complete by:  As directed      Increase activity slowly    Complete by:  As directed             Medication List    STOP taking these medications        propranolol 40 MG tablet  Commonly known as:  INDERAL     ticagrelor 90 MG Tabs tablet  Commonly known as:  BRILINTA      TAKE these medications        amLODipine 10 MG tablet  Commonly known as:  NORVASC  Take 1 tablet (10 mg total) by mouth daily.     aspirin EC 81 MG tablet  Take 81 mg by mouth daily.     Ciclopirox 1 % shampoo  Apply 1 application topically at bedtime. At night after showering     clonazePAM 1 MG tablet  Commonly known as:  KLONOPIN  Take 1 tablet (1 mg total) by mouth at bedtime as needed for anxiety.     clopidogrel 75 MG tablet  Commonly known as:  PLAVIX  Take 1 tablet (75 mg total) by mouth daily.     esomeprazole 40 MG capsule  Commonly known as:  NEXIUM  Take 1 capsule (40 mg total) by mouth daily at 12 noon.     glucose blood test strip  Commonly known as:  BL TEST STRIP PACK  Use to test blood sugar 4 times daily as instructed. Dx code: 250.00     hydrochlorothiazide 12.5 MG tablet  Commonly known as:  HYDRODIURIL  Take 2 tablets (25 mg total) by mouth daily.     insulin aspart 100 UNIT/ML injection  Commonly known as:  novoLOG  Inject 40 Units into the skin 3 (three) times daily before meals. before breakfast, lunch and supper     insulin aspart protamine- aspart (70-30) 100 UNIT/ML injection    Commonly known as:  NOVOLOG MIX 70/30  Inject 40 units into the skin before each meal and 30 units before bedtime as directed.     Insulin Pen Needle 31G X 5 MM Misc  Use 3x a day     B-D INS SYR ULTRAFINE 1CC/30G 30G X 1/2" 1 ML Misc  Generic drug:  Insulin Syringe-Needle U-100     Insulin Syringe-Needle U-100 31G X 15/64" 0.5 ML Misc  Commonly known as:  BD INSULIN SYRINGE ULTRAFINE  Use 3x a day     B-D INS SYR ULTRAFINE 1CC/31G 31G X 5/16" 1 ML Misc  Generic drug:  Insulin  Syringe-Needle U-100     Insulin Syringes (Disposable) U-100 1 ML Misc  60 Units by Does not apply route 4 (four) times daily.     lisinopril 20 MG tablet  Commonly known as:  PRINIVIL,ZESTRIL  Take 1 tablet (20 mg total) by mouth daily.     metFORMIN 500 MG 24 hr tablet  Commonly known as:  GLUCOPHAGE-XR  Take 1 tablet (500 mg total) by mouth 2 (two) times daily with a meal.     metoprolol 50 MG tablet  Commonly known as:  LOPRESSOR  Take 1 tablet (50 mg total) by mouth 2 (two) times daily.     nitroGLYCERIN 0.4 MG SL tablet  Commonly known as:  NITROSTAT  Place 1 tablet (0.4 mg total) under the tongue every 5 (five) minutes as needed for chest pain (CP or SOB).     ONE TOUCH ULTRA SYSTEM KIT W/DEVICE Kit  Use to test blood sugar daily as instructed. Dx code: E11.59     onetouch ultrasoft lancets  Use to test blood sugar 4 times daily as instructed. Dx code: 250.00     pravastatin 40 MG tablet  Commonly known as:  PRAVACHOL  Take 1 tablet (40 mg total) by mouth daily.     REFRESH OP  Place 1 drop into both eyes daily as needed (for dry eyes).       Follow-up Information    Follow up with Sinclair Grooms, MD On 07/17/2015.   Specialty:  Cardiology   Why:  10:30 AM   Contact information:   [address] [place] [address] [phone]      Greater than 30 minutes was spent completing the patient's discharge.    SignedTarri Fuller, [place] 06/05/2015, 7:12  PM

## 2015-06-05 NOTE — Progress Notes (Signed)
Subjective: 8/10 chest tightness which seems worse with inspiration  Objective: Vital signs in last 24 hours: Temp:  [97.8 F (36.6 C)-98.3 F (36.8 C)] 98.3 F (36.8 C) (08/05 0421) Pulse Rate:  [64-79] 78 (08/05 0959) Resp:  [16-22] 22 (08/05 0421) BP: (104-153)/(45-82) 135/62 mmHg (08/05 0958) SpO2:  [95 %-100 %] 95 % (08/05 0421) Weight:  [271 lb 1.6 oz (122.97 kg)] 271 lb 1.6 oz (122.97 kg) (08/05 0421) Last BM Date: 06/04/15  Intake/Output from previous day: 08/04 0701 - 08/05 0700 In: 720 [P.O.:720] Out: 425 [Urine:425] Intake/Output this shift: Total I/O In: 0  Out: 301 [Urine:300; Stool:1]  Medications Scheduled Meds: . amLODipine  10 mg Oral Daily  . aspirin EC  81 mg Oral Daily  . clopidogrel  75 mg Oral Daily  . hydrochlorothiazide  25 mg Oral Daily  . insulin aspart  25 Units Subcutaneous TID AC  . lisinopril  20 mg Oral Daily  . metoprolol  50 mg Oral BID  . pantoprazole  40 mg Oral Daily  . pravastatin  40 mg Oral Daily  . regadenoson      . regadenoson  0.4 mg Intravenous Once   Continuous Infusions: . heparin 1,500 Units/hr (06/04/15 2313)  . nitroGLYCERIN 50 mcg/min (06/04/15 2008)   PRN Meds:.acetaminophen, alum & mag hydroxide-simeth, LORazepam, morphine injection, nitroGLYCERIN, ondansetron (ZOFRAN) IV  PE: General appearance: alert, cooperative and no distress Lungs: mild crackles Heart: regular rate and rhythm, S1, S2 normal, no murmur, click, rub or gallop Abdomen: Tight, +BS, nontender Extremities: No LEE Pulses: 2+ and symmetric Skin: warm and dry Neurologic: Grossly normal  Lab Results:   Recent Labs  06/04/15 1120 06/05/15 0149  WBC 6.8 6.9  HGB 12.9* 11.9*  HCT 38.2* 35.6*  PLT 248 212   BMET  Recent Labs  06/04/15 1120 06/05/15 0728  NA 139 137  K 3.5 4.0  CL 100* 100*  CO2 32 29  GLUCOSE 153* 299*  BUN 14 9  CREATININE 0.95 1.07  CALCIUM 9.3 8.5*   Cardiac Panel (last 3 results)  Recent Labs  06/04/15 1954 06/05/15 0149 06/05/15 0728  TROPONINI <0.03 <0.03 <0.03      Assessment/Plan    Active Problems:   CAD S/P LAD DES 01/30/15   Dyslipidemia, goal LDL below 70   DM type 2 (diabetes mellitus, type 2), insulin dependent   HTN (hypertension)   Severe obesity (BMI >= 40)   Chest pain  1. Chest pain associated with shortness of breath, occur both at rest and with exertion Ruled out for MI and pain level is 8/10.  It did not get worse during the stress test. -Some atypical features, for trend troponin overnight  Brilinta changed to Plavix. On metoprolol.  - continue IV nitroglycerin for now - despite having PND symptom, no sign HF, ?if related to OSA. d-dimer negative.   Sounds like chest wall.  No rub on exam or EKG changes to suggest pericarditis.  Will try a dose of toradol.  2. CAD s/p DES to LAD in 01/30/2015 - echo pending  3. HTN  Stable 4. HLD  5. DM Poorly controlled.  Insulin held because NPO.  Will get after test 6. OSA not on CPAP  7. 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 I agree with the assessment and  plan as outlined above. He has constant chest pressure. EKG without ischemic changes. Troponin negative. This could be related to Brilinta. This has been changed to Plavix. Stress myoview is pending. Echo is pending.   MCALHANY,CHRISTOPHER 06/05/2015 2:25 PM

## 2015-06-05 NOTE — CV Procedure (Signed)
Tech attempted to complete the echo at 11 and 2 today; but the patient was in a procedure.   Edward [name] 06/05/2015

## 2015-06-10 ENCOUNTER — Telehealth: Payer: Self-pay | Admitting: Behavioral Health

## 2015-06-10 NOTE — Telephone Encounter (Signed)
Spoke with the patient after post discharge from the hospital and to possibly schedule an appointment to be seen by the provider. Per the patient, he will not be able to come in for a follow-up visit this week, but would like to call back to schedule for next week once he has spoken to his brother to be sure about transportation to and from the office on that day.

## 2015-06-15 ENCOUNTER — Other Ambulatory Visit: Payer: Self-pay | Admitting: *Deleted

## 2015-06-15 MED ORDER — "BD INSULIN SYR ULTRAFINE II 31G X 5/16"" 1 ML MISC": Status: DC

## 2015-06-19 ENCOUNTER — Encounter: Payer: Self-pay | Admitting: Family Medicine

## 2015-06-19 ENCOUNTER — Telehealth: Payer: Self-pay | Admitting: Internal Medicine

## 2015-06-19 MED ORDER — "INSULIN SYRINGE-NEEDLE U-100 31G X 15/64"" 0.5 ML MISC": Status: DC

## 2015-06-19 NOTE — Telephone Encounter (Signed)
Please call in rx to [facility] on [address] for the syringes

## 2015-06-22 ENCOUNTER — Encounter: Payer: Self-pay | Admitting: Family Medicine

## 2015-06-22 ENCOUNTER — Ambulatory Visit (INDEPENDENT_AMBULATORY_CARE_PROVIDER_SITE_OTHER): Payer: Medicare Other | Admitting: Family Medicine

## 2015-06-22 VITALS — BP 149/81 | HR 67 | Temp 97.8°F | Wt 268.0 lb

## 2015-06-22 DIAGNOSIS — Z9861 Coronary angioplasty status: Secondary | ICD-10-CM

## 2015-06-22 DIAGNOSIS — J209 Acute bronchitis, unspecified: Secondary | ICD-10-CM | POA: Diagnosis not present

## 2015-06-22 DIAGNOSIS — I2510 Atherosclerotic heart disease of native coronary artery without angina pectoris: Secondary | ICD-10-CM

## 2015-06-22 DIAGNOSIS — I200 Unstable angina: Secondary | ICD-10-CM

## 2015-06-22 DIAGNOSIS — E785 Hyperlipidemia, unspecified: Secondary | ICD-10-CM | POA: Diagnosis not present

## 2015-06-22 DIAGNOSIS — I10 Essential (primary) hypertension: Secondary | ICD-10-CM | POA: Diagnosis not present

## 2015-06-22 DIAGNOSIS — J9801 Acute bronchospasm: Secondary | ICD-10-CM | POA: Insufficient documentation

## 2015-06-22 DIAGNOSIS — G4733 Obstructive sleep apnea (adult) (pediatric): Secondary | ICD-10-CM | POA: Insufficient documentation

## 2015-06-22 MED ORDER — ALBUTEROL SULFATE HFA 108 (90 BASE) MCG/ACT IN AERS: 2.0000 | INHALATION_SPRAY | Freq: Four times a day (QID) | RESPIRATORY_TRACT | Status: DC | PRN

## 2015-06-22 MED ORDER — ALBUTEROL SULFATE (2.5 MG/3ML) 0.083% IN NEBU
2.5000 mg | INHALATION_SOLUTION | Freq: Once | RESPIRATORY_TRACT | Status: AC
  Administered 2015-06-22: 2.5 mg via RESPIRATORY_TRACT

## 2015-06-22 MED ORDER — BECLOMETHASONE DIPROPIONATE 40 MCG/ACT IN AERS: 2.0000 | INHALATION_SPRAY | Freq: Two times a day (BID) | RESPIRATORY_TRACT | Status: DC

## 2015-06-22 NOTE — Assessment & Plan Note (Signed)
Wheezing completely cleared with albuterol nebb proair and qvar No hx smoking or asthma

## 2015-06-22 NOTE — Assessment & Plan Note (Addendum)
Stable con't lisinopril, norvasc and metoprolol

## 2015-06-22 NOTE — Patient Instructions (Signed)
Bronchospasm °A bronchospasm is a spasm or tightening of the airways going into the lungs. During a bronchospasm breathing becomes more difficult because the airways get smaller. When this happens there can be coughing, a whistling sound when breathing (wheezing), and difficulty breathing. Bronchospasm is often associated with asthma, but not all patients who experience a bronchospasm have asthma. °CAUSES  °A bronchospasm is caused by inflammation or irritation of the airways. The inflammation or irritation may be triggered by:  °· Allergies (such as to animals, pollen, food, or mold). Allergens that cause bronchospasm may cause wheezing immediately after exposure or many hours later.   °· Infection. Viral infections are believed to be the most common cause of bronchospasm.   °· Exercise.   °· Irritants (such as pollution, cigarette smoke, strong odors, aerosol sprays, and paint fumes).   °· Weather changes. Winds increase molds and pollens in the air. Rain refreshes the air by washing irritants out. Cold air may cause inflammation.   °· Stress and emotional upset.   °SIGNS AND SYMPTOMS  °· Wheezing.   °· Excessive nighttime coughing.   °· Frequent or severe coughing with a simple cold.   °· Chest tightness.   °· Shortness of breath.   °DIAGNOSIS  °Bronchospasm is usually diagnosed through a history and physical exam. Tests, such as chest X-rays, are sometimes done to look for other conditions. °TREATMENT  °· Inhaled medicines can be given to open up your airways and help you breathe. The medicines can be given using either an inhaler or a nebulizer machine. °· Corticosteroid medicines may be given for severe bronchospasm, usually when it is associated with asthma. °HOME CARE INSTRUCTIONS  °· Always have a plan prepared for seeking medical care. Know when to call your health care provider and local emergency services (911 in the [place] Know where you can access local emergency care. °· Only take medicines as  directed by your health care provider. °· If you were prescribed an inhaler or nebulizer machine, ask your health care provider to explain how to use it correctly. Always use a spacer with your inhaler if you were given one. °· It is necessary to remain calm during an attack. Try to relax and breathe more slowly.  °· Control your home environment in the following ways:   °¨ Change your heating and air conditioning filter at least once a month.   °¨ Limit your use of fireplaces and wood stoves. °¨ Do not smoke and do not allow smoking in your home.   °¨ Avoid exposure to perfumes and fragrances.   °¨ Get rid of pests (such as roaches and mice) and their droppings.   °¨ Throw away plants if you see mold on them.   °¨ Keep your house clean and dust free.   °¨ Replace carpet with wood, tile, or vinyl flooring. Carpet can trap dander and dust.   °¨ Use allergy-proof pillows, mattress covers, and box spring covers.   °¨ Wash bed sheets and blankets every week in hot water and dry them in a dryer.   °¨ Use blankets that are made of polyester or cotton.   °¨ Wash hands frequently. °SEEK MEDICAL CARE IF:  °· You have muscle aches.   °· You have chest pain.   °· The sputum changes from clear or white to yellow, green, gray, or bloody.   °· The sputum you cough up gets thicker.   °· There are problems that may be related to the medicine you are given, such as a rash, itching, swelling, or trouble breathing.   °SEEK IMMEDIATE MEDICAL CARE IF:  °· You have worsening wheezing and coughing even   after taking your prescribed medicines.   °· You have increased difficulty breathing.   °· You develop severe chest pain. °MAKE SURE YOU:  °· Understand these instructions. °· Will watch your condition. °· Will get help right away if you are not doing well or get worse. °Document Released: 10/20/2003 Document Revised: 10/22/2013 Document Reviewed: 04/08/2013 °[employer]® Patient Information ©2015 [employer]. This information is not  intended to replace advice given to you by your health care provider. Make sure you discuss any questions you have with your health care provider. ° °

## 2015-06-22 NOTE — Progress Notes (Signed)
Patient ID: Edward [name], male   DOB: [birth year], 74 y.o.   MRN: [id number]   Subjective:    Patient ID: Edward [name], male    DOB: 04-05-1941, 74 y.o.   MRN: [id number]  Chief Complaint  Patient presents with  . Hospitalization Follow-up    Chest pain-- He stopped the Plavix Yesterday.  . Diarrhea    x's a few days.   . Cough    that comes and goes and coughing up phlegm.    HPI Patient is in today for hospital f/u for chest pain.  He rulled out for MI and toprol seemed to help the chest pain.  Propranolol was stopped because he was already on a BB.  The chest pain has resolved.  He is c/o or dry cough and wheezing x a few weeks.  No fever, no chills.     Past Medical History  Diagnosis Date  . Hypertension   . CAD (coronary artery disease)     a. Pt reports "small heart attack" in 2013 at [facility], denies need for stent or CABG, does not know findings of cath.  Marland Kitchen TIA (transient ischemic attack) 2002  . Dyslipidemia, goal LDL below 70 01/29/2014  . GERD (gastroesophageal reflux disease)   . Enlarged prostate   . Shortness of breath     exertion  . DM type 2 (diabetes mellitus, type 2), insulin dependent 01/29/2014    fasting cbg 50-120 with new regimen  . Anxiety   . Heart attack 02/03/2014    "mild heart attack"  . Pneumonia 01/2013  . Pneumonia 03/2014    hospitalized  . Sleep apnea     does not use cpap (06/04/2015)  . JQBHALPF(790.2)     "monthly" (06/04/2015)  . Arthritis     'all over"  . Tremor of both hands     Past Surgical History  Procedure Laterality Date  . Total knee arthroplasty Bilateral   . Lumbar disc surgery    . Cataract extraction Bilateral   . Joint replacement    . Back surgery    . Cataract extraction, bilateral Bilateral   . Shoulder arthroscopy Right 07/30/2014    Procedure: Right Shoulder Arthroscopy, Debridement, Decompression, Manipulation Under Anesthesia;  Surgeon: Newt Minion, MD;  Location: [place];  Service:  Orthopedics;  Laterality: Right;  . Left heart catheterization with coronary angiogram N/A 01/28/2014    Procedure: LEFT HEART CATHETERIZATION WITH CORONARY ANGIOGRAM;  Surgeon: Sinclair Grooms, MD;  Location: [facility] CATH LAB;  Service: Cardiovascular;  Laterality: N/A;  . Left heart catheterization with coronary angiogram N/A 01/30/2015    Procedure: LEFT HEART CATHETERIZATION WITH CORONARY ANGIOGRAM;  Surgeon: Belva Crome, MD;  Location: [facility] CATH LAB;  Service: Cardiovascular;  Laterality: N/A;  . Cardiac catheterization  01/28/14    + CAD treat medically  . Coronary angioplasty with stent placement  01/30/2015    DES Promus  Premier to LAD by Dr Tamala Julian    Family History  Problem Relation Age of Onset  . CAD Brother     2 brothers - CABG  . Alzheimer's disease Sister   . CAD Sister   . Prostate cancer Brother   . Diabetes Other     entire family    Social History   Social History  . Marital Status: Married    Spouse Name: N/A  . Number of Children: 0  . Years of Education: N/A   Occupational History  .  retired    Social History Main Topics  . Smoking status: Never Smoker   . Smokeless tobacco: Never Used  . Alcohol Use: No  . Drug Use: No  . Sexual Activity: No   Other Topics Concern  . 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   He lives alone.  His wife is living in [place] at this time.  They do not have children.   Highest level of education:  College graduate    Outpatient Prescriptions Prior to Visit  Medication Sig Dispense Refill  . amLODipine (NORVASC) 10 MG tablet Take 1 tablet (10 mg total) by mouth daily. 30 tablet 11  . aspirin EC 81 MG tablet Take 81 mg by mouth daily.    . B-D INS SYR ULTRAFINE 1CC/30G 30G X 1/2" 1 ML MISC   0  . B-D INS SYR ULTRAFINE 1CC/31G 31G X 5/16" 1 ML MISC Use to inject insulin 3 times daily as instructed. 100 each 11  . Blood  Glucose Monitoring Suppl (ONE TOUCH ULTRA SYSTEM KIT) W/DEVICE KIT Use to test blood sugar daily as instructed. Dx code: E11.59 1 each 0  . Ciclopirox 1 % shampoo Apply 1 application topically at bedtime. At night after showering  0  . clonazePAM (KLONOPIN) 1 MG tablet Take 1 tablet (1 mg total) by mouth at bedtime as needed for anxiety. 30 tablet 0  . esomeprazole (NEXIUM) 40 MG capsule Take 1 capsule (40 mg total) by mouth daily at 12 noon. (Patient taking differently: Take 40 mg by mouth daily as needed (acid reflux). ) 30 capsule 11  . glucose blood (BL TEST STRIP PACK) test strip Use to test blood sugar 4 times daily as instructed. Dx code: 250.00 200 each 4  . hydrochlorothiazide (HYDRODIURIL) 12.5 MG tablet Take 2 tablets (25 mg total) by mouth daily. 30 tablet 11  . insulin aspart (NOVOLOG) 100 UNIT/ML injection Inject 40 Units into the skin 3 (three) times daily before meals. before breakfast, lunch and supper    . insulin aspart protamine- aspart (NOVOLOG MIX 70/30) (70-30) 100 UNIT/ML injection Inject 40 units into the skin before each meal and 30 units before bedtime as directed. 60 mL 1  . Insulin Pen Needle 31G X 5 MM MISC Use 3x a day 100 each 11  . Insulin Syringe-Needle U-100 (BD INSULIN SYRINGE ULTRAFINE) 31G X 15/64" 0.5 ML MISC Use 3x a day 100 each 5  . Insulin Syringes, Disposable, U-100 1 ML MISC 60 Units by Does not apply route 4 (four) times daily. 120 each 2  . Lancets (ONETOUCH ULTRASOFT) lancets Use to test blood sugar 4 times daily as instructed. Dx code: 250.00 200 each 4  . lisinopril (PRINIVIL,ZESTRIL) 20 MG tablet Take 1 tablet (20 mg total) by mouth daily. 30 tablet 6  . metFORMIN (GLUCOPHAGE-XR) 500 MG 24 hr tablet Take 1 tablet (500 mg total) by mouth 2 (two) times daily with a meal. 30 tablet 2  . metoprolol (LOPRESSOR) 50 MG tablet Take 1 tablet (50 mg total) by mouth 2 (two) times daily. 60 tablet 6  . nitroGLYCERIN (NITROSTAT) 0.4 MG SL tablet Place 1 tablet  (0.4 mg total) under the tongue every 5 (five) minutes as needed for chest pain (CP or SOB). 25 tablet 4  . Polyvinyl Alcohol-Povidone (REFRESH OP) Place 1 drop into both eyes daily as needed (  for dry eyes).    . pravastatin (PRAVACHOL) 40 MG tablet Take 1 tablet (40 mg total) by mouth daily. 30 tablet 5  . clopidogrel (PLAVIX) 75 MG tablet Take 1 tablet (75 mg total) by mouth daily. (Patient not taking: Reported on 06/22/2015) 30 tablet 11   No facility-administered medications prior to visit.    No Known Allergies  Review of Systems  Constitutional: Positive for chills. Negative for fever and malaise/fatigue.  HENT: Negative for congestion.   Eyes: Negative for discharge.  Respiratory: Positive for cough. Negative for shortness of breath.   Cardiovascular: Negative for chest pain, palpitations and leg swelling.  Gastrointestinal: Negative for nausea and abdominal pain.  Genitourinary: Negative for dysuria.  Musculoskeletal: Negative for falls.  Skin: Negative for rash.  Neurological: Negative for loss of consciousness and headaches.  Endo/Heme/Allergies: Negative for environmental allergies.  Psychiatric/Behavioral: Negative for depression. The patient is not nervous/anxious.        Objective:    Physical Exam  Constitutional: He is oriented to person, place, and time. He appears well-developed and well-nourished.  HENT:  Right Ear: External ear normal.  Left Ear: External ear normal.  + PND + errythema  Eyes: Conjunctivae are normal. Right eye exhibits no discharge. Left eye exhibits no discharge.  Cardiovascular: Normal rate, regular rhythm and normal heart sounds.   No murmur heard. Pulmonary/Chest: Effort normal. No respiratory distress. He has wheezes. He has no rales. He exhibits no tenderness.  Musculoskeletal: He exhibits no edema.  Lymphadenopathy:    He has cervical adenopathy.  Neurological: He is alert and oriented to person, place, and time.  Psychiatric: He  has a normal mood and affect. His behavior is normal. Judgment and thought content normal.    BP 149/81 mmHg  Pulse 67  Temp(Src) 97.8 F (36.6 C) (Oral)  Wt 268 lb (121.564 kg)  SpO2 98% Wt Readings from Last 3 Encounters:  06/22/15 268 lb (121.564 kg)  06/05/15 271 lb 1.6 oz (122.97 kg)  06/04/15 274 lb 6.4 oz (124.467 kg)     Lab Results  Component Value Date   WBC 6.9 06/05/2015   HGB 11.9* 06/05/2015   HCT 35.6* 06/05/2015   PLT 212 06/05/2015   GLUCOSE 299* 06/05/2015   CHOL 155 02/09/2015   TRIG 83.0 02/09/2015   HDL 38.00* 02/09/2015   LDLCALC 100* 02/09/2015   ALT 18 06/04/2015   AST 18 06/04/2015   NA 137 06/05/2015   K 4.0 06/05/2015   CL 100* 06/05/2015   CREATININE 1.07 06/05/2015   BUN 9 06/05/2015   CO2 29 06/05/2015   TSH 1.041 12/02/2014   PSA 0.74 02/11/2014   INR 0.9 01/27/2015   HGBA1C 10.5 05/20/2015   MICROALBUR 91.9* 02/11/2014    Lab Results  Component Value Date   TSH 1.041 12/02/2014   Lab Results  Component Value Date   WBC 6.9 06/05/2015   HGB 11.9* 06/05/2015   HCT 35.6* 06/05/2015   MCV 84.4 06/05/2015   PLT 212 06/05/2015   Lab Results  Component Value Date   NA 137 06/05/2015   K 4.0 06/05/2015   CO2 29 06/05/2015   GLUCOSE 299* 06/05/2015   BUN 9 06/05/2015   CREATININE 1.07 06/05/2015   BILITOT 0.5 06/04/2015   ALKPHOS 95 06/04/2015   AST 18 06/04/2015   ALT 18 06/04/2015   PROT 6.8 06/04/2015   ALBUMIN 3.7 06/04/2015   CALCIUM 8.5* 06/05/2015   ANIONGAP 8 06/05/2015   GFR 88.14 02/09/2015  Lab Results  Component Value Date   CHOL 155 02/09/2015   Lab Results  Component Value Date   HDL 38.00* 02/09/2015   Lab Results  Component Value Date   LDLCALC 100* 02/09/2015   Lab Results  Component Value Date   TRIG 83.0 02/09/2015   Lab Results  Component Value Date   CHOLHDL 4 02/09/2015   Lab Results  Component Value Date   HGBA1C 10.5 05/20/2015  stress test:  There was no ST segment  deviation noted during stress.  The study is normal.  This is a low risk study.  The left ventricular ejection fraction is normal (55-65%).  Nuclear stress EF: 67%.  Defect 1: There is a small defect of mild severity present in the apex location.  Findings consistent with prior myocardial infarction.  Low risk stress nuclear study with a small and mild fixed apical defect and normal left ventricular regional and global systolic function. No reversible ischemia is identified. The size and severity of the apical defect are substantially improved compared to the January 19, 2015 study.      Assessment & Plan:   Problem List Items Addressed This Visit    OSA (obstructive sleep apnea)   HTN (hypertension) (Chronic)    Stable con't lisinopril, norvasc and metoprolol      Relevant Orders   Comprehensive metabolic panel   CBC with Differential/Platelet   CAD S/P LAD DES 01/30/15    Pt stopped plavix due to diarrhea and hives and restarted the brilinta on his own Diarrhea and hives resolved F/u cardiology      Bronchospasm    Wheezing completely cleared with albuterol nebb proair and qvar No hx smoking or asthma      Relevant Medications   beclomethasone (QVAR) 40 MCG/ACT inhaler   albuterol (PROVENTIL HFA;VENTOLIN HFA) 108 (90 BASE) MCG/ACT inhaler   Other Relevant Orders   Ambulatory referral to Pulmonology    Other Visit Diagnoses    Hyperlipidemia    -  Primary    Relevant Orders    Comprehensive metabolic panel    Lipid panel    CBC with Differential/Platelet    Acute bronchitis, unspecified organism        Relevant Medications    albuterol (PROVENTIL) (2.5 MG/3ML) 0.083% nebulizer solution 2.5 mg (Completed)    Other Relevant Orders    CBC with Differential/Platelet    Obstructive sleep apnea        Relevant Orders    Ambulatory referral to Pulmonology       I am having Mr. Griep start on beclomethasone and albuterol. I am also having him maintain his  aspirin EC, glucose blood, onetouch ultrasoft, Insulin Syringes (Disposable), amLODipine, ONE TOUCH ULTRA SYSTEM KIT, nitroGLYCERIN, esomeprazole, Ciclopirox, B-D INS SYR ULTRAFINE 1CC/30G, pravastatin, Insulin Pen Needle, insulin aspart, clonazePAM, lisinopril, metoprolol, metFORMIN, insulin aspart protamine- aspart, Polyvinyl Alcohol-Povidone (REFRESH OP), clopidogrel, hydrochlorothiazide, B-D INS SYR ULTRAFINE 1CC/31G, Insulin Syringe-Needle U-100, and ticagrelor. We administered albuterol.  Meds ordered this encounter  Medications  . ticagrelor (BRILINTA) 90 MG TABS tablet    Sig: Take 90 mg by mouth 2 (two) times daily.  Marland Kitchen albuterol (PROVENTIL) (2.5 MG/3ML) 0.083% nebulizer solution 2.5 mg    Sig:   . beclomethasone (QVAR) 40 MCG/ACT inhaler    Sig: Inhale 2 puffs into the lungs 2 (two) times daily.    Dispense:  1 Inhaler    Refill:  12  . albuterol (PROVENTIL HFA;VENTOLIN HFA) 108 (90 BASE) MCG/ACT inhaler  Sig: Inhale 2 puffs into the lungs every 6 (six) hours as needed for wheezing or shortness of breath.    Dispense:  1 Inhaler    Refill:  [place], DO

## 2015-06-22 NOTE — Assessment & Plan Note (Signed)
Per endo °

## 2015-06-22 NOTE — Progress Notes (Signed)
Pre visit review using our clinic review tool, if applicable. No additional management support is needed unless otherwise documented below in the visit note. 

## 2015-06-22 NOTE — Assessment & Plan Note (Signed)
Pt stopped plavix due to diarrhea and hives and restarted the brilinta on his own Diarrhea and hives resolved F/u cardiology

## 2015-06-30 ENCOUNTER — Institutional Professional Consult (permissible substitution): Payer: Federal, State, Local not specified - PPO | Admitting: Pulmonary Disease

## 2015-07-08 ENCOUNTER — Other Ambulatory Visit (INDEPENDENT_AMBULATORY_CARE_PROVIDER_SITE_OTHER): Payer: Medicare Other

## 2015-07-08 ENCOUNTER — Ambulatory Visit (INDEPENDENT_AMBULATORY_CARE_PROVIDER_SITE_OTHER): Payer: Medicare Other | Admitting: Pulmonary Disease

## 2015-07-08 ENCOUNTER — Encounter: Payer: Self-pay | Admitting: Pulmonary Disease

## 2015-07-08 VITALS — BP 140/78 | HR 82 | Ht 68.0 in | Wt 268.0 lb

## 2015-07-08 DIAGNOSIS — G4733 Obstructive sleep apnea (adult) (pediatric): Secondary | ICD-10-CM | POA: Diagnosis not present

## 2015-07-08 DIAGNOSIS — J209 Acute bronchitis, unspecified: Secondary | ICD-10-CM

## 2015-07-08 DIAGNOSIS — R0602 Shortness of breath: Secondary | ICD-10-CM | POA: Insufficient documentation

## 2015-07-08 DIAGNOSIS — E785 Hyperlipidemia, unspecified: Secondary | ICD-10-CM | POA: Diagnosis not present

## 2015-07-08 DIAGNOSIS — R05 Cough: Secondary | ICD-10-CM | POA: Insufficient documentation

## 2015-07-08 DIAGNOSIS — I10 Essential (primary) hypertension: Secondary | ICD-10-CM

## 2015-07-08 DIAGNOSIS — I200 Unstable angina: Secondary | ICD-10-CM

## 2015-07-08 DIAGNOSIS — R059 Cough, unspecified: Secondary | ICD-10-CM | POA: Insufficient documentation

## 2015-07-08 DIAGNOSIS — R0600 Dyspnea, unspecified: Secondary | ICD-10-CM

## 2015-07-08 LAB — CBC WITH DIFFERENTIAL/PLATELET
BASOS PCT: 0.3 % (ref 0.0–3.0)
Basophils Absolute: 0 10*3/uL (ref 0.0–0.1)
EOS ABS: 0.3 10*3/uL (ref 0.0–0.7)
Eosinophils Relative: 3.6 % (ref 0.0–5.0)
HEMATOCRIT: 42.6 % (ref 39.0–52.0)
HEMOGLOBIN: 14.3 g/dL (ref 13.0–17.0)
LYMPHS PCT: 22 % (ref 12.0–46.0)
Lymphs Abs: 1.9 10*3/uL (ref 0.7–4.0)
MCHC: 33.6 g/dL (ref 30.0–36.0)
MCV: 83.9 fl (ref 78.0–100.0)
Monocytes Absolute: 0.8 10*3/uL (ref 0.1–1.0)
Monocytes Relative: 8.8 % (ref 3.0–12.0)
Neutro Abs: 5.7 10*3/uL (ref 1.4–7.7)
Neutrophils Relative %: 65.3 % (ref 43.0–77.0)
Platelets: 239 10*3/uL (ref 150.0–400.0)
RBC: 5.08 Mil/uL (ref 4.22–5.81)
RDW: 13.9 % (ref 11.5–15.5)
WBC: 8.7 10*3/uL (ref 4.0–10.5)

## 2015-07-08 LAB — COMPREHENSIVE METABOLIC PANEL
ALK PHOS: 116 U/L (ref 39–117)
ALT: 16 U/L (ref 0–53)
AST: 15 U/L (ref 0–37)
Albumin: 4 g/dL (ref 3.5–5.2)
BILIRUBIN TOTAL: 0.4 mg/dL (ref 0.2–1.2)
BUN: 14 mg/dL (ref 6–23)
CO2: 33 meq/L — AB (ref 19–32)
Calcium: 9.8 mg/dL (ref 8.4–10.5)
Chloride: 99 mEq/L (ref 96–112)
Creatinine, Ser: 1.06 mg/dL (ref 0.40–1.50)
GFR: 88.04 mL/min (ref >60.00)
GLUCOSE: 212 mg/dL — AB (ref 70–99)
POTASSIUM: 3.8 meq/L (ref 3.5–5.1)
SODIUM: 139 meq/L (ref 135–145)
TOTAL PROTEIN: 7.1 g/dL (ref 6.0–8.3)

## 2015-07-08 LAB — LIPID PANEL
Cholesterol: 197 mg/dL (ref 0–200)
HDL: 40.5 mg/dL (ref >39.00)
NONHDL: 156.6
Total CHOL/HDL Ratio: 5
Triglycerides: 242 mg/dL — ABNORMAL HIGH (ref 0.0–149.0)
VLDL: 48.4 mg/dL — AB (ref 0.0–40.0)

## 2015-07-08 LAB — LDL CHOLESTEROL, DIRECT: Direct LDL: 144 mg/dL

## 2015-07-08 MED ORDER — LOSARTAN POTASSIUM 100 MG PO TABS: 100.0000 mg | ORAL_TABLET | Freq: Every day | ORAL | Status: DC

## 2015-07-08 NOTE — Assessment & Plan Note (Signed)
He has a diagnosis of obstructive sleep apnea but stopped using CPAP a few years ago because of claustrophobia. He says that he has been experiencing increasing fatigue and sleepiness during the daytime. He is obese. He is certainly at risk for having obstructive sleep apnea.  Plan: No driving while sleepy Obtain results of prior polysomnogram CPAP titration study

## 2015-07-08 NOTE — Progress Notes (Signed)
Subjective:    Patient ID: Edward [name], male    DOB: 03/08/1941, 74 y.o.   MRN: [id number]  HPI Chief Complaint  Patient presents with  . Advice Only    Referred by Dr. Etter Sjogren for bronchospasms.  pt c/o wheezing, prod cough, worse qhs and first waking up qam.  S/s present X3-4 weeks.     This is a pleasant 74 year old male who never smoked he comes to my clinic today for evaluation of shortness of breath, cough, and fatigue. He has a history of coronary artery disease and had 2 stents placed by Dr. Daneen Schick here at [facility]. He says that during his childhood he never had respiratory illnesses and the only family history of lung disease are in his brothers who served time in the infantry overseas. He says that he started having shortness of breath on exertion approximately 2 years ago. He notices it with heavy activity such as recently when he tried to cleaning the pool in the back of his house. He has also noticed some wheezing in the mornings which is associated with heavy throat because. He says that this is also been going on for about 2 years. He notices a scratchy sensation in his throat in the need to clear his throat on a regular basis. Of note, he takes lisinopril. He also has heartburn and currently does not follow a diet specific to this. He does not prop the head of his bed up. He recently started taking Nexium. He has a history of coronary artery disease as detailed above, he states that he has not had any chest pain recently. He has a cough during the daytime which is dry, but in the mornings he typically produces a significant amount of mucus. He does not have sinus congestion or a runny nose.  Past Medical History  Diagnosis Date  . Hypertension   . CAD (coronary artery disease)     a. Pt reports "small heart attack" in 2013 at [facility], denies need for stent or CABG, does not know findings of cath.  Marland Kitchen TIA (transient ischemic attack) 2002  .  Dyslipidemia, goal LDL below 70 01/29/2014  . GERD (gastroesophageal reflux disease)   . Enlarged prostate   . Shortness of breath     exertion  . DM type 2 (diabetes mellitus, type 2), insulin dependent 01/29/2014    fasting cbg 50-120 with new regimen  . Anxiety   . Heart attack 02/03/2014    "mild heart attack"  . Pneumonia 01/2013  . Pneumonia 03/2014    hospitalized  . Sleep apnea     does not use cpap (06/04/2015)  . THYHOOIL(579.7)     "monthly" (06/04/2015)  . Arthritis     'all over"  . Tremor of both hands      Family History  Problem Relation Age of Onset  . CAD Brother     2 brothers - CABG  . Alzheimer's disease Sister   . CAD Sister   . Prostate cancer Brother   . Diabetes Other     entire family  . Asthma Brother     2 brothers      Social History   Social History  . Marital Status: Married    Spouse Name: N/A  . Number of Children: 0  . Years of Education: N/A   Occupational History  . retired    Social History Main Topics  . Smoking status: Never Smoker   .  Smokeless tobacco: Never Used  . Alcohol Use: No  . Drug Use: No  . Sexual Activity: No   Other Topics Concern  . 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   He lives alone.  His wife is living in [place] at this time.  They do not have children.   Highest level of education:  College graduate     No Known Allergies   Outpatient Prescriptions Prior to Visit  Medication Sig Dispense Refill  . albuterol (PROVENTIL HFA;VENTOLIN HFA) 108 (90 BASE) MCG/ACT inhaler Inhale 2 puffs into the lungs every 6 (six) hours as needed for wheezing or shortness of breath. 1 Inhaler 5  . amLODipine (NORVASC) 10 MG tablet Take 1 tablet (10 mg total) by mouth daily. 30 tablet 11  . aspirin EC 81 MG tablet Take 81 mg by mouth daily.    . B-D INS SYR ULTRAFINE 1CC/30G 30G X 1/2" 1 ML MISC   0  . B-D INS SYR ULTRAFINE  1CC/31G 31G X 5/16" 1 ML MISC Use to inject insulin 3 times daily as instructed. 100 each 11  . Blood Glucose Monitoring Suppl (ONE TOUCH ULTRA SYSTEM KIT) W/DEVICE KIT Use to test blood sugar daily as instructed. Dx code: E11.59 1 each 0  . Ciclopirox 1 % shampoo Apply 1 application topically at bedtime. At night after showering  0  . esomeprazole (NEXIUM) 40 MG capsule Take 1 capsule (40 mg total) by mouth daily at 12 noon. (Patient taking differently: Take 40 mg by mouth daily as needed (acid reflux). ) 30 capsule 11  . glucose blood (BL TEST STRIP PACK) test strip Use to test blood sugar 4 times daily as instructed. Dx code: 250.00 200 each 4  . hydrochlorothiazide (HYDRODIURIL) 12.5 MG tablet Take 2 tablets (25 mg total) by mouth daily. 30 tablet 11  . insulin aspart (NOVOLOG) 100 UNIT/ML injection Inject 40 Units into the skin 3 (three) times daily before meals. before breakfast, lunch and supper    . insulin aspart protamine- aspart (NOVOLOG MIX 70/30) (70-30) 100 UNIT/ML injection Inject 40 units into the skin before each meal and 30 units before bedtime as directed. 60 mL 1  . Insulin Pen Needle 31G X 5 MM MISC Use 3x a day 100 each 11  . Insulin Syringe-Needle U-100 (BD INSULIN SYRINGE ULTRAFINE) 31G X 15/64" 0.5 ML MISC Use 3x a day 100 each 5  . Insulin Syringes, Disposable, U-100 1 ML MISC 60 Units by Does not apply route 4 (four) times daily. 120 each 2  . Lancets (ONETOUCH ULTRASOFT) lancets Use to test blood sugar 4 times daily as instructed. Dx code: 250.00 200 each 4  . metFORMIN (GLUCOPHAGE-XR) 500 MG 24 hr tablet Take 1 tablet (500 mg total) by mouth 2 (two) times daily with a meal. 30 tablet 2  . metoprolol (LOPRESSOR) 50 MG tablet Take 1 tablet (50 mg total) by mouth 2 (two) times daily. 60 tablet 6  . nitroGLYCERIN (NITROSTAT) 0.4 MG SL tablet Place 1 tablet (0.4 mg total) under the tongue every 5 (five) minutes as needed for chest pain (CP or SOB). 25 tablet 4  . Polyvinyl  Alcohol-Povidone (REFRESH OP) Place 1 drop into both eyes daily as needed (for dry eyes).    . pravastatin (PRAVACHOL) 40 MG tablet Take 1 tablet (40 mg total) by mouth daily. [place]  tablet 5  . ticagrelor (BRILINTA) 90 MG TABS tablet Take 90 mg by mouth 2 (two) times daily.    Marland Kitchen lisinopril (PRINIVIL,ZESTRIL) 20 MG tablet Take 1 tablet (20 mg total) by mouth daily. 30 tablet 6  . clonazePAM (KLONOPIN) 1 MG tablet Take 1 tablet (1 mg total) by mouth at bedtime as needed for anxiety. (Patient not taking: Reported on 07/08/2015) 30 tablet 0  . beclomethasone (QVAR) 40 MCG/ACT inhaler Inhale 2 puffs into the lungs 2 (two) times daily. (Patient not taking: Reported on 07/08/2015) 1 Inhaler 12  . clopidogrel (PLAVIX) 75 MG tablet Take 1 tablet (75 mg total) by mouth daily. (Patient not taking: Reported on 06/22/2015) 30 tablet 11   No facility-administered medications prior to visit.       Review of Systems  Constitutional: Negative for fever and unexpected weight change.  HENT: Positive for congestion, postnasal drip and sinus pressure. Negative for dental problem, ear pain, nosebleeds, rhinorrhea, sneezing, sore throat and trouble swallowing.   Eyes: Negative for redness and itching.  Respiratory: Positive for cough, shortness of breath and wheezing. Negative for chest tightness.   Cardiovascular: Negative for palpitations and leg swelling.  Gastrointestinal: Negative for nausea and vomiting.  Genitourinary: Negative for dysuria.  Musculoskeletal: Negative for joint swelling.  Skin: Negative for rash.  Neurological: Negative for headaches.  Hematological: Does not bruise/bleed easily.  Psychiatric/Behavioral: Negative for dysphoric mood. The patient is not nervous/anxious.        Objective:   Physical Exam Filed Vitals:   07/08/15 1408  BP: 140/78  Pulse: 82  Height: '5\' 8"'  (1.727 m)  Weight: 268 lb (121.564 kg)  SpO2: 99%  RA  Gen: overweight but well appearing, no acute distress HENT:  NCAT, OP clear, neck supple without masses Eyes: PERRL, EOMi Lymph: no cervical lymphadenopathy PULM: CTA B CV: RRR, no mgr, no JVD GI: BS+, soft, nontender, no hsm Derm: no rash or skin breakdown MSK: normal bulk and tone Neuro: A&Ox4, CN II-XII intact, strength 5/5 in all 4 extremities Psyche: normal mood and affect      2016 Nuclear stress:      There was no ST segment deviation noted during stress.  The study is normal.  This is a low risk study.  The left ventricular ejection fraction is normal (55-65%).  Nuclear stress EF: 67%.  Defect 1: There is a small defect of mild severity present in the apex location.  Findings consistent with prior myocardial infarction.  Low risk stress nuclear study with a small and mild fixed apical defect and normal left ventricular regional and global systolic function. No reversible ischemia is identified. The size and severity of the apical defect are substantially improved compared to the January 19, 2015 study.     2016 chest x-ray images personally reviewed showing normal pulmonary parenchyma but slightly low lung volumes    Assessment & Plan:  OSA (obstructive sleep apnea) He has a diagnosis of obstructive sleep apnea but stopped using CPAP a few years ago because of claustrophobia. He says that he has been experiencing increasing fatigue and sleepiness during the daytime. He is obese. He is certainly at risk for having obstructive sleep apnea.  Plan: No driving while sleepy Obtain results of prior polysomnogram CPAP titration study  Cough He has 2 separate coughs. One is dry during the daytime and nearly constant. He also has heavy throat mucus in the mornings. I think that the dry cough during the daytime and the scratchy throat  he has is likely related to lisinopril. However, acid reflux is contributing and likely the cause of the cough he has in the mornings. He has significant heartburn symptoms despite taking  Nexium.  He is concerned about the possibility of a chronic infection because he has had 2 episodes of pneumonia in the past. I suppose this is possible, but his most recent chest x-ray is normal and he has no clear underlying lung disease with the exception of possibly asthma. It would be a bit unusual to have atypical mycobacterium without an underlying lung disease.  Plan: Acid reflux lifestyle modifications reviewed and encourage Try Pepcid at night in addition to Nexium Follow-up with GI Stop lisinopril Start losartan Obtain sputum culture for AFB Follow-up in 6 weeks, if no improvement in cough and mucus production then consider CT chest to look for evidence of atypical mycobacterial disease and follow-up results of sputum culture.  Dyspnea He has been experiencing increasing dyspnea over the last year or 2. Some of this is likely related to obesity and deconditioning. He never smoked. He has a history of asthma but he says he's not quite sure if he really has this or not. His exertional dyspnea is really the primary problem and any wheezing he has experiences primarily in the mornings when he has a lot of throat mucus. Today his lungs are clear. So it is not really clear to me if he has asthma or not.  Plan: Continue as needed albuterol Obtain full pulmonary function testing     Current outpatient prescriptions:  .  albuterol (PROVENTIL HFA;VENTOLIN HFA) 108 (90 BASE) MCG/ACT inhaler, Inhale 2 puffs into the lungs every 6 (six) hours as needed for wheezing or shortness of breath., Disp: 1 Inhaler, Rfl: 5 .  amLODipine (NORVASC) 10 MG tablet, Take 1 tablet (10 mg total) by mouth daily., Disp: 30 tablet, Rfl: 11 .  aspirin EC 81 MG tablet, Take 81 mg by mouth daily., Disp: , Rfl:  .  B-D INS SYR ULTRAFINE 1CC/30G 30G X 1/2" 1 ML MISC, , Disp: , Rfl: 0 .  B-D INS SYR ULTRAFINE 1CC/31G 31G X 5/16" 1 ML MISC, Use to inject insulin 3 times daily as instructed., Disp: 100 each, Rfl: 11 .   Blood Glucose Monitoring Suppl (ONE TOUCH ULTRA SYSTEM KIT) W/DEVICE KIT, Use to test blood sugar daily as instructed. Dx code: E11.59, Disp: 1 each, Rfl: 0 .  Ciclopirox 1 % shampoo, Apply 1 application topically at bedtime. At night after showering, Disp: , Rfl: 0 .  esomeprazole (NEXIUM) 40 MG capsule, Take 1 capsule (40 mg total) by mouth daily at 12 noon. (Patient taking differently: Take 40 mg by mouth daily as needed (acid reflux). ), Disp: 30 capsule, Rfl: 11 .  glucose blood (BL TEST STRIP PACK) test strip, Use to test blood sugar 4 times daily as instructed. Dx code: 250.00, Disp: 200 each, Rfl: 4 .  hydrochlorothiazide (HYDRODIURIL) 12.5 MG tablet, Take 2 tablets (25 mg total) by mouth daily., Disp: 30 tablet, Rfl: 11 .  insulin aspart (NOVOLOG) 100 UNIT/ML injection, Inject 40 Units into the skin 3 (three) times daily before meals. before breakfast, lunch and supper, Disp: , Rfl:  .  insulin aspart protamine- aspart (NOVOLOG MIX 70/30) (70-30) 100 UNIT/ML injection, Inject 40 units into the skin before each meal and 30 units before bedtime as directed., Disp: 60 mL, Rfl: 1 .  Insulin Pen Needle 31G X 5 MM MISC, Use 3x a day, Disp: 100  each, Rfl: 11 .  Insulin Syringe-Needle U-100 (BD INSULIN SYRINGE ULTRAFINE) 31G X 15/64" 0.5 ML MISC, Use 3x a day, Disp: 100 each, Rfl: 5 .  Insulin Syringes, Disposable, U-100 1 ML MISC, 60 Units by Does not apply route 4 (four) times daily., Disp: 120 each, Rfl: 2 .  Lancets (ONETOUCH ULTRASOFT) lancets, Use to test blood sugar 4 times daily as instructed. Dx code: 250.00, Disp: 200 each, Rfl: 4 .  metFORMIN (GLUCOPHAGE-XR) 500 MG 24 hr tablet, Take 1 tablet (500 mg total) by mouth 2 (two) times daily with a meal., Disp: 30 tablet, Rfl: 2 .  metoprolol (LOPRESSOR) 50 MG tablet, Take 1 tablet (50 mg total) by mouth 2 (two) times daily., Disp: 60 tablet, Rfl: 6 .  nitroGLYCERIN (NITROSTAT) 0.4 MG SL tablet, Place 1 tablet (0.4 mg total) under the tongue  every 5 (five) minutes as needed for chest pain (CP or SOB)., Disp: 25 tablet, Rfl: 4 .  Polyvinyl Alcohol-Povidone (REFRESH OP), Place 1 drop into both eyes daily as needed (for dry eyes)., Disp: , Rfl:  .  pravastatin (PRAVACHOL) 40 MG tablet, Take 1 tablet (40 mg total) by mouth daily., Disp: 30 tablet, Rfl: 5 .  ticagrelor (BRILINTA) 90 MG TABS tablet, Take 90 mg by mouth 2 (two) times daily., Disp: , Rfl:  .  clonazePAM (KLONOPIN) 1 MG tablet, Take 1 tablet (1 mg total) by mouth at bedtime as needed for anxiety. (Patient not taking: Reported on 07/08/2015), Disp: 30 tablet, Rfl: 0 .  losartan (COZAAR) 100 MG tablet, Take 1 tablet (100 mg total) by mouth daily., Disp: 30 tablet, Rfl: 11

## 2015-07-08 NOTE — Patient Instructions (Signed)
We will arrange a lung function testing call you with the results We will arrange a sleep study and call you with the results and set up CPAP Follow the acid reflux lifestyle modification sheet we gave you Take Pepcid at night in addition to the Nexium which you take in the morning Problem the head of your bed up Provide [place] a sample of the mucus, you can bring it back within 24 hours of coughing up mucus Stop taking lisinopril Start taking losartan 1 tablet daily We will see you back in 6 weeks or sooner if needed

## 2015-07-08 NOTE — Assessment & Plan Note (Signed)
He has been experiencing increasing dyspnea over the last year or 2. Some of this is likely related to obesity and deconditioning. He never smoked. He has a history of asthma but he says he's not quite sure if he really has this or not. His exertional dyspnea is really the primary problem and any wheezing he has experiences primarily in the mornings when he has a lot of throat mucus. Today his lungs are clear. So it is not really clear to me if he has asthma or not.  Plan: Continue as needed albuterol Obtain full pulmonary function testing

## 2015-07-08 NOTE — Assessment & Plan Note (Signed)
He has 2 separate coughs. One is dry during the daytime and nearly constant. He also has heavy throat mucus in the mornings. I think that the dry cough during the daytime and the scratchy throat he has is likely related to lisinopril. However, acid reflux is contributing and likely the cause of the cough he has in the mornings. He has significant heartburn symptoms despite taking Nexium.  He is concerned about the possibility of a chronic infection because he has had 2 episodes of pneumonia in the past. I suppose this is possible, but his most recent chest x-ray is normal and he has no clear underlying lung disease with the exception of possibly asthma. It would be a bit unusual to have atypical mycobacterium without an underlying lung disease.  Plan: Acid reflux lifestyle modifications reviewed and encourage Try Pepcid at night in addition to Nexium Follow-up with GI Stop lisinopril Start losartan Obtain sputum culture for AFB Follow-up in 6 weeks, if no improvement in cough and mucus production then consider CT chest to look for evidence of atypical mycobacterial disease and follow-up results of sputum culture.

## 2015-07-10 ENCOUNTER — Encounter: Payer: Self-pay | Admitting: Family Medicine

## 2015-07-10 ENCOUNTER — Other Ambulatory Visit: Payer: Self-pay

## 2015-07-10 MED ORDER — TRAZODONE HCL 50 MG PO TABS: 25.0000 mg | ORAL_TABLET | Freq: Every evening | ORAL | Status: DC | PRN

## 2015-07-10 NOTE — Telephone Encounter (Signed)
trazadone 50 mg 1/2 -1 po qhs prn #30  1 refills

## 2015-07-13 DIAGNOSIS — M7501 Adhesive capsulitis of right shoulder: Secondary | ICD-10-CM | POA: Diagnosis not present

## 2015-07-13 DIAGNOSIS — M19111 Post-traumatic osteoarthritis, right shoulder: Secondary | ICD-10-CM | POA: Diagnosis not present

## 2015-07-14 ENCOUNTER — Telehealth: Payer: Self-pay | Admitting: Internal Medicine

## 2015-07-14 NOTE — Telephone Encounter (Signed)
Called pt and advised him that we just received the refill paperwork from [employer] and we will fax once Dr Cruzita Lederer signs the form. Pt voiced understanding.

## 2015-07-14 NOTE — Telephone Encounter (Signed)
Pt requesting a call back regarding the novolin pt assistance-

## 2015-07-17 ENCOUNTER — Other Ambulatory Visit: Payer: Self-pay

## 2015-07-17 ENCOUNTER — Other Ambulatory Visit: Payer: Self-pay | Admitting: *Deleted

## 2015-07-17 ENCOUNTER — Encounter: Payer: Self-pay | Admitting: Interventional Cardiology

## 2015-07-17 ENCOUNTER — Ambulatory Visit (INDEPENDENT_AMBULATORY_CARE_PROVIDER_SITE_OTHER): Payer: Medicare Other | Admitting: Interventional Cardiology

## 2015-07-17 VITALS — BP 134/68 | HR 81 | Ht 68.0 in | Wt 270.8 lb

## 2015-07-17 DIAGNOSIS — R079 Chest pain, unspecified: Secondary | ICD-10-CM | POA: Diagnosis not present

## 2015-07-17 DIAGNOSIS — I10 Essential (primary) hypertension: Secondary | ICD-10-CM

## 2015-07-17 DIAGNOSIS — R0600 Dyspnea, unspecified: Secondary | ICD-10-CM | POA: Diagnosis not present

## 2015-07-17 DIAGNOSIS — E785 Hyperlipidemia, unspecified: Secondary | ICD-10-CM

## 2015-07-17 DIAGNOSIS — I2510 Atherosclerotic heart disease of native coronary artery without angina pectoris: Secondary | ICD-10-CM | POA: Diagnosis not present

## 2015-07-17 DIAGNOSIS — Z9861 Coronary angioplasty status: Secondary | ICD-10-CM

## 2015-07-17 DIAGNOSIS — I200 Unstable angina: Secondary | ICD-10-CM | POA: Diagnosis not present

## 2015-07-17 DIAGNOSIS — G4733 Obstructive sleep apnea (adult) (pediatric): Secondary | ICD-10-CM

## 2015-07-17 LAB — BASIC METABOLIC PANEL
BUN: 12 mg/dL (ref 6–23)
CALCIUM: 9 mg/dL (ref 8.4–10.5)
CHLORIDE: 103 meq/L (ref 96–112)
CO2: 29 meq/L (ref 19–32)
CREATININE: 1.02 mg/dL (ref 0.40–1.50)
GFR: 92.03 mL/min (ref >60.00)
Glucose, Bld: 280 mg/dL — ABNORMAL HIGH (ref 70–99)
Potassium: 3.7 mEq/L (ref 3.5–5.1)
Sodium: 138 mEq/L (ref 135–145)

## 2015-07-17 LAB — BRAIN NATRIURETIC PEPTIDE: Pro B Natriuretic peptide (BNP): 35 pg/mL (ref 0.0–100.0)

## 2015-07-17 MED ORDER — POTASSIUM CHLORIDE CRYS ER 20 MEQ PO TBCR: 20.0000 meq | EXTENDED_RELEASE_TABLET | Freq: Every day | ORAL | Status: DC

## 2015-07-17 MED ORDER — FUROSEMIDE 40 MG PO TABS: 40.0000 mg | ORAL_TABLET | Freq: Every day | ORAL | Status: DC

## 2015-07-17 MED ORDER — FENOFIBRATE 160 MG PO TABS: 160.0000 mg | ORAL_TABLET | Freq: Every day | ORAL | Status: DC

## 2015-07-17 NOTE — Patient Instructions (Addendum)
Medication Instructions:  Your physician has recommended you make the following change in your medication:  1)STOP HCTZ 2)STOP Propanolol 3)START Lasix 40mg  daily. An Rx has been sent to your pharmacy 4)START Potassium 65meq daily. An Rx has been sent to your pharmacy   Labwork: Bmet, Bnp today  Your physician recommends that you return for lab work on 07/24/15 (Bmet,Bnp)   Testing/Procedures: None ordered  Follow-Up: You have a follow up appointment scheduled on 08/25/15 @ 9am   Any Other Special Instructions Will Be Listed Below (If Applicable). Call the office in 7-10 days with an update of your shortness of breath

## 2015-07-17 NOTE — Telephone Encounter (Signed)
Patient called and was upset that he went to the pharmacy to pick up the furosemide and potassium that was supposed to be sent in at his visit today. I reviewed the chart and these were new medications that he was supposed to start today and they were not sent in. I apologized and let him know that I would take care of this for him.

## 2015-07-17 NOTE — Progress Notes (Signed)
Cardiology Office Note   Date:  07/17/2015   ID:  Edward [name], DOB 11-22-1940, MRN [id number]  PCP:  Garnet Koyanagi, DO  Cardiologist:  Sinclair Grooms, MD   Chief Complaint  Patient presents with  . Shortness of Breath      History of Present Illness: Edward [name] is a 74 y.o. male who presents for CAD, LAD DES April 2016, history of transient ischemic attack, dyslipidemia, gastroesophageal reflux, essential hypertension, type 2 diabetes with complications, and obstructive sleep apnea.  The patient has had difficulty since stenting of the LAD. He has had readmission to the hospital for chest pain and dyspnea. Myocardial perfusion imaging demonstrated resolution of anterior ischemia after stenting. Lung fields were clear on chest x-ray. Symptoms were felt to be potentially compatible with reflux. He continues to complain of exertional dyspnea. Chest discomfort can occur at random. It is not responsive to sublingual nitroglycerin. He denies tachycardia and palpitations.  Past Medical History  Diagnosis Date  . Hypertension   . CAD (coronary artery disease)     a. Pt reports "small heart attack" in 2013 at [facility], denies need for stent or CABG, does not know findings of cath.  Marland Kitchen TIA (transient ischemic attack) 2002  . Dyslipidemia, goal LDL below 70 01/29/2014  . GERD (gastroesophageal reflux disease)   . Enlarged prostate   . Shortness of breath     exertion  . DM type 2 (diabetes mellitus, type 2), insulin dependent 01/29/2014    fasting cbg 50-120 with new regimen  . Anxiety   . Heart attack 02/03/2014    "mild heart attack"  . Pneumonia 01/2013  . Pneumonia 03/2014    hospitalized  . Sleep apnea     does not use cpap (06/04/2015)  . FAOZHYQM(578.4)     "monthly" (06/04/2015)  . Arthritis     'all over"  . Tremor of both hands     Past Surgical History  Procedure Laterality Date  . Total knee arthroplasty Bilateral   . Lumbar disc surgery    .  Cataract extraction Bilateral   . Joint replacement    . Back surgery    . Cataract extraction, bilateral Bilateral   . Shoulder arthroscopy Right 07/30/2014    Procedure: Right Shoulder Arthroscopy, Debridement, Decompression, Manipulation Under Anesthesia;  Surgeon: Newt Minion, MD;  Location: [place];  Service: Orthopedics;  Laterality: Right;  . Left heart catheterization with coronary angiogram N/A 01/28/2014    Procedure: LEFT HEART CATHETERIZATION WITH CORONARY ANGIOGRAM;  Surgeon: Sinclair Grooms, MD;  Location: [facility] CATH LAB;  Service: Cardiovascular;  Laterality: N/A;  . Left heart catheterization with coronary angiogram N/A 01/30/2015    Procedure: LEFT HEART CATHETERIZATION WITH CORONARY ANGIOGRAM;  Surgeon: Belva Crome, MD;  Location: [facility] CATH LAB;  Service: Cardiovascular;  Laterality: N/A;  . Cardiac catheterization  01/28/14    + CAD treat medically  . Coronary angioplasty with stent placement  01/30/2015    DES Promus  [place] to LAD by Dr Tamala Julian     Current Outpatient Prescriptions  Medication Sig Dispense Refill  . albuterol (PROVENTIL HFA;VENTOLIN HFA) 108 (90 BASE) MCG/ACT inhaler Inhale 2 puffs into the lungs every 6 (six) hours as needed for wheezing or shortness of breath. 1 Inhaler 5  . amLODipine (NORVASC) 10 MG tablet Take 1 tablet (10 mg total) by mouth daily. 30 tablet 11  . aspirin EC 81 MG tablet Take 81 mg by  mouth daily.    . B-D INS SYR ULTRAFINE 1CC/31G 31G X 5/16" 1 ML MISC Use to inject insulin 3 times daily as instructed. 100 each 11  . Blood Glucose Monitoring Suppl (ONE TOUCH ULTRA SYSTEM KIT) W/DEVICE KIT Use to test blood sugar daily as instructed. Dx code: E11.59 1 each 0  . Ciclopirox 1 % shampoo Apply 1 application topically at bedtime. At night after showering  0  . clonazePAM (KLONOPIN) 1 MG tablet Take 1 tablet (1 mg total) by mouth at bedtime as needed for anxiety. 30 tablet 0  . esomeprazole (NEXIUM) 40 MG capsule Take 40 mg by mouth daily at 12  noon.    . glucose blood (BL TEST STRIP PACK) test strip Use to test blood sugar 4 times daily as instructed. Dx code: 250.00 200 each 4  . hydrochlorothiazide (HYDRODIURIL) 12.5 MG tablet Take 2 tablets (25 mg total) by mouth daily. 30 tablet 11  . insulin aspart (NOVOLOG) 100 UNIT/ML injection Inject 40 Units into the skin 3 (three) times daily before meals. before breakfast, lunch and supper    . insulin aspart protamine- aspart (NOVOLOG MIX 70/30) (70-30) 100 UNIT/ML injection Inject 40 units into the skin before each meal and 30 units before bedtime as directed. 60 mL 1  . Insulin Pen Needle 31G X 5 MM MISC Use 3x a day 100 each 11  . Insulin Syringe-Needle U-100 (BD INSULIN SYRINGE ULTRAFINE) 31G X 15/64" 0.5 ML MISC Use 3x a day 100 each 5  . Insulin Syringes, Disposable, U-100 1 ML MISC 60 Units by Does not apply route 4 (four) times daily. 120 each 2  . Lancets (ONETOUCH ULTRASOFT) lancets Use to test blood sugar 4 times daily as instructed. Dx code: 250.00 200 each 4  . lisinopril (PRINIVIL,ZESTRIL) 20 MG tablet Take 20 mg by mouth daily.  0  . losartan (COZAAR) 100 MG tablet Take 1 tablet (100 mg total) by mouth daily. 30 tablet 11  . metFORMIN (GLUCOPHAGE-XR) 500 MG 24 hr tablet Take 1 tablet (500 mg total) by mouth 2 (two) times daily with a meal. 30 tablet 2  . metoprolol (LOPRESSOR) 50 MG tablet Take 1 tablet (50 mg total) by mouth 2 (two) times daily. 60 tablet 6  . nitroGLYCERIN (NITROSTAT) 0.4 MG SL tablet Place 1 tablet (0.4 mg total) under the tongue every 5 (five) minutes as needed for chest pain (CP or SOB). 25 tablet 4  . Polyvinyl Alcohol-Povidone (REFRESH OP) Place 1 drop into both eyes daily as needed (for dry eyes).    . pravastatin (PRAVACHOL) 40 MG tablet Take 1 tablet (40 mg total) by mouth daily. 30 tablet 5  . ticagrelor (BRILINTA) 90 MG TABS tablet Take 90 mg by mouth 2 (two) times daily.    . traZODone (DESYREL) 50 MG tablet Take 0.5-1 tablets (25-50 mg total) by  mouth at bedtime as needed for sleep. 30 tablet 1   No current facility-administered medications for this visit.    Allergies:   Review of patient's allergies indicates no known allergies.    Social History:  The patient  reports that he has never smoked. He has never used smokeless tobacco. He reports that he does not drink alcohol or use illicit drugs.   Family History:  The patient's family history includes Alzheimer's disease in his sister; Asthma in his brother; CAD in his brother and sister; Diabetes in his other; Prostate cancer in his brother.    ROS:  Please see   the history of present illness.   Otherwise, review of systems are positive for anxiety, cough, dyspnea, exertional intolerance, reflux, and untreated sleep apnea..   All other systems are reviewed and negative.    PHYSICAL EXAM: VS:  BP 134/68 mmHg  Pulse 81  Ht 5' 8" (1.727 m)  Wt 122.834 kg (270 lb 12.8 oz)  BMI 41.18 kg/m2  SpO2 97% , BMI Body mass index is 41.18 kg/(m^2). GEN: Well nourished, well developed, in no acute distress HEENT: normal Neck: no JVD, carotid bruits, or masses Cardiac: RRR.  There is no murmur, rub, or gallop. There is no edema. Respiratory:  clear to auscultation bilaterally, normal work of breathing. GI: soft, nontender, nondistended, + BS MS: no deformity or atrophy Skin: warm and dry, no rash Neuro:  Strength and sensation are intact Psych: euthymic mood, full affect   EKG:  EKG is not ordered today. The ekg reveals the most recent EKG from 06/05/2015 demonstrating QS pattern V1 through V3. No acute ST-T wave change.   Recent Labs: 12/02/2014: TSH 1.041 06/04/2015: B Natriuretic Peptide 35.1 07/08/2015: ALT 16; BUN 14; Creatinine, Ser 1.06; Hemoglobin 14.3; Platelets 239.0; Potassium 3.8; Sodium 139    Lipid Panel    Component Value Date/Time   CHOL 197 07/08/2015 1344   TRIG 242.0* 07/08/2015 1344   HDL 40.50 07/08/2015 1344   CHOLHDL 5 07/08/2015 1344   VLDL 48.4*  07/08/2015 1344   LDLCALC 100* 02/09/2015 1210   LDLDIRECT 144.0 07/08/2015 1344      Wt Readings from Last 3 Encounters:  07/17/15 122.834 kg (270 lb 12.8 oz)  07/08/15 121.564 kg (268 lb)  06/22/15 121.564 kg (268 lb)      Other studies Reviewed: Additional studies/ records that were reviewed today include: The recent hospitalizations. Additional workup which is included repeat nuclear scintigraphy which demonstrates normalization of anterior wall perfusion.. The findings include chest x-rays demonstrate no evidence of congestion..    ASSESSMENT AND PLAN:  1. CAD S/P LAD DES 01/30/15 Intolerant of Plavix. Back: Dual antiplatelet therapy with Brilinta and aspirin.  2. Essential hypertension Controlled  3. Dyspnea Still occurring in etiology uncertain. Possibly related to intrinsic lung disease/sleep apnea  4. Chest pain, unspecified chest pain type Chest pain unresponsive to nitroglycerin with some improvement myocardial perfusion study. The patient has residual distal LAD disease and nonobstructive proximal LAD disease  5. Dyslipidemia, goal LDL below 70 On therapy  6. OSA (obstructive sleep apnea) Untreated   Current medicines are reviewed at length with the patient today.  The patient has the following concerns regarding medicines: Dyspnea and chest discomfort.  The following changes/actions have been instituted:     basic metabolic panel and BNP. Repeat in 2 weeks.   Discontinue hydrochlorothiazide and start furosemide 40 mg per day and K Dur 20 mEq per day   the patient continues to take both propranolol and metoprolol even though this was discontinued and the August 6 admission. We will discontinue propranolol.  Labs/ tests ordered today include:   Orders Placed This Encounter  Procedures  . Basic metabolic panel  . Basic metabolic panel  . B Nat Peptide  . B Nat Peptide     Disposition:   FU with HS in 6 weeks  Signed, Sinclair Grooms, MD    07/17/2015 1:14 PM    [facility] [address], [place], [place]  [address] Phone: [phone]; Fax: [phone]

## 2015-07-20 ENCOUNTER — Telehealth: Payer: Self-pay | Admitting: Gastroenterology

## 2015-07-20 DIAGNOSIS — R197 Diarrhea, unspecified: Secondary | ICD-10-CM

## 2015-07-20 NOTE — Telephone Encounter (Signed)
Patient with several month history of diarrhea, but for the last 2 weeks he has been having liquid stools.  3-4 a day.  Dr. Fuller Plan, he is scheduled to see you next Wed.  Do you want to do stool studies prior to the appt?

## 2015-07-20 NOTE — Telephone Encounter (Signed)
I left a message for the patient at his request to go to the lab for stool studies

## 2015-07-20 NOTE — Telephone Encounter (Signed)
Please send GI pathogen panel now

## 2015-07-21 ENCOUNTER — Ambulatory Visit (HOSPITAL_COMMUNITY)
Admission: RE | Admit: 2015-07-21 | Discharge: 2015-07-21 | Disposition: A | Discharge status: Home / Self Care | Payer: Medicare Other | Source: Ambulatory Visit | Attending: Pulmonary Disease | Admitting: Pulmonary Disease

## 2015-07-21 DIAGNOSIS — R0600 Dyspnea, unspecified: Secondary | ICD-10-CM

## 2015-07-21 DIAGNOSIS — R0602 Shortness of breath: Secondary | ICD-10-CM | POA: Insufficient documentation

## 2015-07-21 LAB — PULMONARY FUNCTION TEST
DL/VA % PRED: 121 %
DL/VA: 5.42 ml/min/mmHg/L
DLCO UNC % PRED: 71 %
DLCO cor % pred: 72 %
DLCO cor: 21.35 ml/min/mmHg
DLCO unc: 21.17 ml/min/mmHg
FEF 25-75 PRE: 2.36 L/s
FEF 25-75 Post: 2.63 L/sec
FEF2575-%CHANGE-POST: 11 %
FEF2575-%Pred-Post: 121 %
FEF2575-%Pred-Pre: 108 %
FEV1-%Change-Post: 4 %
FEV1-%PRED-PRE: 77 %
FEV1-%Pred-Post: 81 %
FEV1-PRE: 2 L
FEV1-Post: 2.1 L
FEV1FVC-%CHANGE-POST: 0 %
FEV1FVC-%Pred-Pre: 107 %
FEV6-%CHANGE-POST: 3 %
FEV6-%PRED-POST: 76 %
FEV6-%PRED-PRE: 74 %
FEV6-POST: 2.53 L
FEV6-PRE: 2.44 L
FEV6FVC-%CHANGE-POST: 0 %
FEV6FVC-%PRED-POST: 104 %
FEV6FVC-%PRED-PRE: 105 %
FVC-%Change-Post: 4 %
FVC-%Pred-Post: 73 %
FVC-%Pred-Pre: 70 %
FVC-Post: 2.55 L
FVC-Pre: 2.44 L
POST FEV6/FVC RATIO: 99 %
Post FEV1/FVC ratio: 82 %
Pre FEV1/FVC ratio: 82 %
Pre FEV6/FVC Ratio: 100 %
RV % PRED: 79 %
RV: 1.91 L
TLC % PRED: 63 %
TLC: 4.21 L

## 2015-07-21 MED ORDER — ALBUTEROL SULFATE (2.5 MG/3ML) 0.083% IN NEBU
2.5000 mg | INHALATION_SOLUTION | Freq: Once | RESPIRATORY_TRACT | Status: AC
  Administered 2015-07-21: 2.5 mg via RESPIRATORY_TRACT

## 2015-07-24 ENCOUNTER — Other Ambulatory Visit (INDEPENDENT_AMBULATORY_CARE_PROVIDER_SITE_OTHER): Payer: Medicare Other | Admitting: *Deleted

## 2015-07-24 DIAGNOSIS — R0600 Dyspnea, unspecified: Secondary | ICD-10-CM

## 2015-07-24 LAB — BASIC METABOLIC PANEL
BUN: 14 mg/dL (ref 6–23)
CALCIUM: 9.4 mg/dL (ref 8.4–10.5)
CO2: 28 mEq/L (ref 19–32)
Chloride: 98 mEq/L (ref 96–112)
Creatinine, Ser: 1.13 mg/dL (ref 0.40–1.50)
GFR: 81.77 mL/min (ref >60.00)
Glucose, Bld: 366 mg/dL — ABNORMAL HIGH (ref 70–99)
POTASSIUM: 4.1 meq/L (ref 3.5–5.1)
SODIUM: 137 meq/L (ref 135–145)

## 2015-07-24 LAB — BRAIN NATRIURETIC PEPTIDE: Pro B Natriuretic peptide (BNP): 24 pg/mL (ref 0.0–100.0)

## 2015-07-27 ENCOUNTER — Encounter: Payer: Self-pay | Admitting: Neurology

## 2015-07-27 ENCOUNTER — Ambulatory Visit (INDEPENDENT_AMBULATORY_CARE_PROVIDER_SITE_OTHER): Payer: Medicare Other | Admitting: Neurology

## 2015-07-27 VITALS — BP 138/74 | HR 71 | Ht 68.0 in | Wt 263.0 lb

## 2015-07-27 DIAGNOSIS — E0842 Diabetes mellitus due to underlying condition with diabetic polyneuropathy: Secondary | ICD-10-CM

## 2015-07-27 DIAGNOSIS — G5621 Lesion of ulnar nerve, right upper limb: Secondary | ICD-10-CM | POA: Diagnosis not present

## 2015-07-27 DIAGNOSIS — G250 Essential tremor: Secondary | ICD-10-CM | POA: Diagnosis not present

## 2015-07-27 DIAGNOSIS — G629 Polyneuropathy, unspecified: Secondary | ICD-10-CM

## 2015-07-27 DIAGNOSIS — E1142 Type 2 diabetes mellitus with diabetic polyneuropathy: Secondary | ICD-10-CM

## 2015-07-27 DIAGNOSIS — E1342 Other specified diabetes mellitus with diabetic polyneuropathy: Secondary | ICD-10-CM | POA: Diagnosis not present

## 2015-07-27 DIAGNOSIS — I200 Unstable angina: Secondary | ICD-10-CM | POA: Diagnosis not present

## 2015-07-27 NOTE — Progress Notes (Signed)
Follow-up Visit   Date: 07/27/2015    Edward [name] MRN: [id number] DOB: [birth year]   Interim History: Edward [name] is a 74 y.o. right-handed African American male with coronary artery disease, TIA (2002, manifested as right sided numbness), insulin dependent diabetes mellitus type 2, hypertension, hyperlipidemia, GERD and BPH returning to the clinic for follow-up of diabetic neuropathy and essential tremor.  The patient was accompanied to the clinic by self.  History of present illness: He moved from [place] in February 2015 to be closer to his brother. He has been diabetic since 1985 and in late 1990s, he developed numbness > tingling of the feet which seems to have been stable since onset. He has paresthesias below the level of the ankles, worse on the right side, as well as interemittent numbness/tingling of the right hand. He ambulates independently. He saw Dr. Etter Sjogren in April who started neurontin 187m twice daily which seems to help slightly.  He has previously been a [place]ery [place] His last clinic note dated 08/14/2013, indicates that he was being seen for history of memory loss and headaches. Headaches improved significantly after he retired. He has short-term memory problems, which does not interefere with daily life. Mostly forgetting little details or tasks. He is driving and has not been involving in any accidents. He is able to do all his IADLs and ADLs. No problems with finances.  UPDATE 12/02/2014:  He was last seen in June 2015 and has new complains of voice changes.  He underwent right shoulder surgery in September 2015 and since then he has noticed a change in his voice, no slurring, but sounds more "airy".  He complains of stiffness of his arms, especially in the morning.  He had a fall in December 2015 after slipping on some grease in the kitchen and was unable to get up himself. He complains about bilateral hand tremor and voice change  which is worse when he is stressed. His handwriting has become worse and he has more difficulty with fine motor tasks.  Tremors are predominately worse when trying to do tasks, but can be present when he is resting.  His two brothers have tremors.  UPDATE 01/15/2015: He has new onset headaches over the parietal region, which are sharp, lasting 3-4 hours and occurs several times per week. He takes ibuprofen as needed, which helps. His has noticed improvement since start inderal because he is able to write his name better.  He also states that since paying attention to how his right arm is positioned, his hand paresthesias are better.  Today, he has moderate bilateral ptosis which he noticed over the past several days, denies double vision, weakness, or trouble swallowing.    UPDATE 04/23/2015:  He was having exertional anging and found to have 50% LAD stenosis for which he underwent DES placement on 4/1. His tremors of the hands and voice are most bothersome. 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  He had made himself a lovely breakfast and the tremors were so bad that his plate fell out of his hands and on the floor. 2  UPDATE 07/27/2015:  Since increasing his propranolol to 450mtwice daily, tremors somewhat improved. Per Epic notes, he was recommended to stop propranolol as he is also taking metoprolol by his cardiologist.  I will clarify that propranolol was being used for tremors, and continue only if safe from a cardiac standpoint.  Because of benefit with his tremors, patient has been taking his propranolol 31m twice daily, despite being instructed to stop it.   He continues to have numbness of the right hand, which is most likely due to right ulnar neuropathy and peripheral neuropathy, but he is not interested in PT or surgery.  He has noticed that whenever his sugars are elevated, his tremors are worse.      Medications:  Current Outpatient  Prescriptions on File Prior to Visit  Medication Sig Dispense Refill  . albuterol (PROVENTIL HFA;VENTOLIN HFA) 108 (90 BASE) MCG/ACT inhaler Inhale 2 puffs into the lungs every 6 (six) hours as needed for wheezing or shortness of breath. 1 Inhaler 5  . amLODipine (NORVASC) 10 MG tablet Take 1 tablet (10 mg total) by mouth daily. 30 tablet 11  . aspirin EC 81 MG tablet Take 81 mg by mouth daily.    . B-D INS SYR ULTRAFINE 1CC/31G 31G X 5/16" 1 ML MISC Use to inject insulin 3 times daily as instructed. 100 each 11  . Blood Glucose Monitoring Suppl (ONE TOUCH ULTRA SYSTEM KIT) W/DEVICE KIT Use to test blood sugar daily as instructed. Dx code: E11.59 1 each 0  . Ciclopirox 1 % shampoo Apply 1 application topically at bedtime. At night after showering  0  . clonazePAM (KLONOPIN) 1 MG tablet Take 1 tablet (1 mg total) by mouth at bedtime as needed for anxiety. 30 tablet 0  . esomeprazole (NEXIUM) 40 MG capsule Take 40 mg by mouth daily at 12 noon.    . fenofibrate 160 MG tablet Take 1 tablet (160 mg total) by mouth daily. 30 tablet 2  . furosemide (LASIX) 40 MG tablet Take 1 tablet (40 mg total) by mouth daily. 30 tablet 1  . glucose blood (BL TEST STRIP PACK) test strip Use to test blood sugar 4 times daily as instructed. Dx code: 250.00 200 each 4  . hydrochlorothiazide (HYDRODIURIL) 12.5 MG tablet Take 2 tablets (25 mg total) by mouth daily. 30 tablet 11  . insulin aspart (NOVOLOG) 100 UNIT/ML injection Inject 40 Units into the skin 3 (three) times daily before meals. before breakfast, lunch and supper    . insulin aspart protamine- aspart (NOVOLOG MIX 70/30) (70-30) 100 UNIT/ML injection Inject 40 units into the skin before each meal and 30 units before bedtime as directed. 60 mL 1  . Insulin Pen Needle 31G X 5 MM MISC Use 3x a day 100 each 11  . Insulin Syringe-Needle U-100 (BD INSULIN SYRINGE ULTRAFINE) 31G X 15/64" 0.5 ML MISC Use 3x a day 100 each 5  . Insulin Syringes, Disposable, U-100 1 ML  MISC 60 Units by Does not apply route 4 (four) times daily. 120 each 2  . Lancets (ONETOUCH ULTRASOFT) lancets Use to test blood sugar 4 times daily as instructed. Dx code: 250.00 200 each 4  . lisinopril (PRINIVIL,ZESTRIL) 20 MG tablet Take 20 mg by mouth daily.  0  . losartan (COZAAR) 100 MG tablet Take 1 tablet (100 mg total) by mouth daily. 30 tablet 11  . metFORMIN (GLUCOPHAGE-XR) 500 MG 24 hr tablet Take 1 tablet (500 mg total) by mouth 2 (two) times daily with a meal. 30 tablet 2  . metoprolol (LOPRESSOR) 50 MG tablet Take 1 tablet (50 mg total) by mouth 2 (two) times daily. 60 tablet 6  . nitroGLYCERIN (NITROSTAT) 0.4 MG SL tablet Place 1 tablet (0.4 mg total) under the tongue every 5 (five) minutes as needed for chest  pain (CP or SOB). 25 tablet 4  . Polyvinyl Alcohol-Povidone (REFRESH OP) Place 1 drop into both eyes daily as needed (for dry eyes).    . potassium chloride SA (K-DUR,KLOR-CON) 20 MEQ tablet Take 1 tablet (20 mEq total) by mouth daily. 30 tablet 0  . pravastatin (PRAVACHOL) 40 MG tablet Take 1 tablet (40 mg total) by mouth daily. 30 tablet 5  . ticagrelor (BRILINTA) 90 MG TABS tablet Take 90 mg by mouth 2 (two) times daily.    . traZODone (DESYREL) 50 MG tablet Take 0.5-1 tablets (25-50 mg total) by mouth at bedtime as needed for sleep. 30 tablet 1   No current facility-administered medications on file prior to visit.    Allergies: No Known Allergies  Review of Systems:  CONSTITUTIONAL: No fevers, chills, night sweats, or weight loss.  EYES: No visual changes or eye pain ENT: No hearing changes.  No history of nose bleeds.   RESPIRATORY: No cough, wheezing and shortness of breath.   CARDIOVASCULAR: Negative for chest pain, and palpitations.   GI: Negative for abdominal discomfort, blood in stools or black stools.  No recent change in bowel habits.   GU:  No history of incontinence.   MUSCLOSKELETAL: +history of joint pain or swelling.  No myalgias.   SKIN: Negative  for lesions, rash, and itching.   ENDOCRINE: Negative for cold or heat intolerance, polydipsia or goiter.   PSYCH:  no depression or anxiety symptoms.   NEURO: As Above.   Vital Signs:  BP 138/74 mmHg  Pulse 71  Ht '5\' 8"'  (1.727 m)  Wt 263 lb (119.296 kg)  BMI 40.00 kg/m2  SpO2 98%  Neurological Exam: MENTAL STATUS including orientation to time, place, person, recent and remote memory, attention span and concentration, language, and fund of knowledge is normal.  Tremulous speech pattern, he is able to enunciate lingual and gutteral sounds well.  No dysarthria.   CRANIAL NERVES:   Normal conjugate, extra-ocular eye movements in all directions of gaze.  Mild bilateral ptosis without worsening with sustained upward gaze. Face is symmetric.    MOTOR:  Motor strength is 5/5 in all extremities.  Tone is normal.  No hand tremor today.   MSRs:   Right                                                                 Left brachioradialis 2+  brachioradialis 2+  biceps 1+  biceps 1+  triceps 1+  triceps 1+  patellar 0  Patellar 0  ankle jerk 0  ankle jerk 0    COORDINATION/GAIT:    Gait narrow based and stable unassisted, turns with 3 steps.  Rises to stand without using arms.    Data: EMG 12/01/2014: 1. 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 Overall, these findings are moderately severe in degree electrically.  2. 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 There is no evidence of a cervical/lumbosacral radiculopathy affecting the right side  Lab Results  Component Value Date   HGBA1C 10.5 05/20/2015   Labs 04/08/2014:  Copper 64*, ceruloplasmin 18, vitamin B12 290, SPEP/UPEP with IFE no M protein  CT head 10/21/2014 and 01/15/2015:  Negative   IMPRESSION/PLAN: 1. Essential tremor involving the hands and  voice, stable.  Parkinson's disease is less likely especially with primary action tremors and lack of rigidity, gait instability, and resting tremors.   His symptoms are well controlled on propranolol but because he is already taking metoprolol this was discontinued by his cardiology.  Because will be on two betablockers, I will inquire with this cardiologist regarding safety of continuing propranolol 64m BID for tremor management.  If we need to switch therapies, start primidone 295mat bedtime.   2.  Bilateral ptosis, MG antibody negative  - Clinically stable, consider RNS if symptoms worsen  3.  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  Paresthesias not bothersome enough to start medications Encouraged tight glycemic control  6.  Right ulnar neuropathy at the elbow.   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  Greater than 50% of this time was spent in counseling, explanation of diagnosis, planning of further management, and coordination of care.   Thank you for allowing me to participate in patient's care.  If I can answer any additional questions, I would be pleased to do so.    Sincerely,    Edward K. PaPosey ProntoDO

## 2015-07-27 NOTE — Patient Instructions (Addendum)
I will discuss with your cardiologist regarding propranolol and will be in touch with you   Return to clinic in 3-4 months

## 2015-07-28 ENCOUNTER — Other Ambulatory Visit: Payer: Self-pay | Admitting: Pulmonary Disease

## 2015-07-28 ENCOUNTER — Telehealth: Payer: Self-pay

## 2015-07-28 ENCOUNTER — Telehealth: Payer: Self-pay | Admitting: Neurology

## 2015-07-28 DIAGNOSIS — J189 Pneumonia, unspecified organism: Secondary | ICD-10-CM

## 2015-07-28 DIAGNOSIS — R0600 Dyspnea, unspecified: Secondary | ICD-10-CM

## 2015-07-28 NOTE — Telephone Encounter (Signed)
Am I supposed to call him?

## 2015-07-28 NOTE — Telephone Encounter (Signed)
PT called following up on his medication/Dawn [phone]

## 2015-07-28 NOTE — Telephone Encounter (Signed)
Called to give pt lab results.lmtcb  

## 2015-07-28 NOTE — Telephone Encounter (Signed)
Patient notified

## 2015-07-28 NOTE — Telephone Encounter (Signed)
F/u        Pt returning Lisa's phone call.

## 2015-07-28 NOTE — Progress Notes (Signed)
He can take both as long as BP and HR tolerate. Propranolol has no survival benefit in ischemic heart disease as Metoprolol does.

## 2015-07-28 NOTE — Telephone Encounter (Signed)
-----   Message from Belva Crome, MD sent at 07/24/2015  5:04 PM EDT ----- Labs are okay. How is breathing.

## 2015-07-28 NOTE — Telephone Encounter (Signed)
Please inform him that I have not received any updates, but will reach out to his cardiologist again.  Donika K. Posey Pronto, DO

## 2015-07-28 NOTE — Telephone Encounter (Signed)
Pt aware of lab results. Labs are okay. How is breathing. Pt sts that his breathing is the same.  Pt was seen by his neurologist Dr.Patel yesterday,Dr.Patel will be forwarding an update on his visit to [place]. Pt sts that propanolol was stopped and replaced with metoprolol in April 2016 after d/c from [facility]. Pt sts that he has been taking both Propanolol and Metoprolol, he did not want to stop Propanolol because it helps with his tremors, he is able to carry things without dropping them. Adv pt that he should NOT be taking Propanolol and Metoprolol as they are both beta blockers and one will need to be stopped. Adv pt I will fwd an update to Dr.Smith and call back with his recommendation. Pt verbalized understanding.

## 2015-07-29 ENCOUNTER — Encounter: Payer: Self-pay | Admitting: Gastroenterology

## 2015-07-29 ENCOUNTER — Other Ambulatory Visit: Payer: Medicare Other

## 2015-07-29 ENCOUNTER — Ambulatory Visit (INDEPENDENT_AMBULATORY_CARE_PROVIDER_SITE_OTHER): Payer: Medicare Other | Admitting: Gastroenterology

## 2015-07-29 VITALS — BP 142/60 | HR 82 | Ht 68.0 in | Wt 262.0 lb

## 2015-07-29 DIAGNOSIS — R197 Diarrhea, unspecified: Secondary | ICD-10-CM | POA: Diagnosis not present

## 2015-07-29 DIAGNOSIS — Z86010 Personal history of colonic polyps: Secondary | ICD-10-CM

## 2015-07-29 DIAGNOSIS — I200 Unstable angina: Secondary | ICD-10-CM

## 2015-07-29 DIAGNOSIS — R1033 Periumbilical pain: Secondary | ICD-10-CM

## 2015-07-29 MED ORDER — PROPRANOLOL HCL 40 MG PO TABS: 40.0000 mg | ORAL_TABLET | Freq: Two times a day (BID) | ORAL | Status: DC

## 2015-07-29 MED ORDER — HYOSCYAMINE SULFATE 0.125 MG SL SUBL: SUBLINGUAL_TABLET | SUBLINGUAL | Status: DC

## 2015-07-29 NOTE — Patient Instructions (Signed)
We have sent the following medications to your pharmacy for you to pick up at your convenience:Levsin.  We will call you with the results of the GI pathogen panel.   Thank you for choosing me and [facility].  Pricilla Riffle. Dagoberto Ligas., MD., Marval Regal

## 2015-07-29 NOTE — Telephone Encounter (Signed)
Left message giving patient information and instructions. 

## 2015-07-29 NOTE — Progress Notes (Signed)
    History of Present Illness: This is a 74 year old male who relates frequent episodes of loose urgent stools associated with crampy periumbilical abdominal pain. Discontinued metformin and his symptoms improved significantly but they persist. Submitted a stool sample for GI pathogen panel today. Reflux symptoms are under good control on Nexium 40 mg daily. Denies weight loss, constipation, change in stool caliber, melena, hematochezia, nausea, vomiting, dysphagia, chest pain.  Current Medications, Allergies, Past Medical History, Past Surgical History, Family History and Social History were reviewed in [employer] record.  Physical Exam: General: Well developed , well nourished, no acute distress Head: Normocephalic and atraumatic Eyes:  sclerae anicteric, EOMI Ears: Normal auditory acuity Mouth: No deformity or lesions Lungs: Clear throughout to auscultation Heart: Regular rate and rhythm; no murmurs, rubs or bruits Abdomen: Soft, non tender and non distended. No masses, hepatosplenomegaly or hernias noted. Normal Bowel sounds Musculoskeletal: Symmetrical with no gross deformities  Pulses:  Normal pulses noted Extremities: No clubbing, cyanosis, edema or deformities noted Neurological: Alert oriented x 4, grossly nonfocal Psychological:  Alert and cooperative. Normal mood and affect  Assessment and Recommendations:  1. Frequent diarrhea and crampy periumbilical abdominal pain. Stopped metformin 2 weeks ago with improved symptoms. Lactose free diet for 2 weeks and assess response. Watch diet for any foods that trigger symptoms. Levsin 1-2 q4h prn. If these measure are not successful will obtain a tTG and IgA and if those are negative a trial of FODMAP diet  2. Personal history of adenomatous colon polyps. Colonoscopy 12/2015.  3. GERD and history of gastritis. Continue Nexium 40 mg daily and antireflux measures.   4. CAD with cardiac stent in 01/2015 on Brilinta  and ASA so colonoscopy will likely be deferred until 02/2016.

## 2015-07-29 NOTE — Telephone Encounter (Signed)
Please inform patient that he may continue taking propranolol 40mg  twice daily, but he should monitor his heart rate and blood pressure.  If heart rate drops < 60, we may need to consider alternative medication for tremor.  New Rx will be sent.   Donika K. Posey Pronto, DO

## 2015-07-30 ENCOUNTER — Ambulatory Visit (HOSPITAL_COMMUNITY): Payer: Medicare Other

## 2015-07-30 ENCOUNTER — Telehealth: Payer: Self-pay

## 2015-07-30 LAB — GASTROINTESTINAL PATHOGEN PANEL PCR
C. DIFFICILE TOX A/B, PCR: NEGATIVE
CAMPYLOBACTER, PCR: NEGATIVE
Cryptosporidium, PCR: NEGATIVE
E COLI (STEC) STX1/STX2, PCR: NEGATIVE
E COLI 0157, PCR: NEGATIVE
E coli (ETEC) LT/ST PCR: NEGATIVE
Giardia lamblia, PCR: NEGATIVE
Norovirus, PCR: NEGATIVE
ROTAVIRUS, PCR: NEGATIVE
SALMONELLA, PCR: NEGATIVE
SHIGELLA, PCR: NEGATIVE

## 2015-07-30 MED ORDER — DICYCLOMINE HCL 10 MG PO CAPS: 10.0000 mg | ORAL_CAPSULE | Freq: Three times a day (TID) | ORAL | Status: DC

## 2015-07-30 NOTE — Telephone Encounter (Signed)
Received fax from pharmacy stating Levsin is not covered by patient's insurance. Dr. Fuller Plan can we send in Bentyl instead?

## 2015-07-30 NOTE — Telephone Encounter (Signed)
Bentyl 10 mg tid prn

## 2015-07-30 NOTE — Telephone Encounter (Signed)
Pt aware that Dr.Smith and Dr.Patel have have a discussion. Pt should continue Propanolol and Metoprolol as prescribed. Pt voiced appreciation for the call and verbalized understanding.

## 2015-07-30 NOTE — Telephone Encounter (Signed)
-----   Message from Belva Crome, MD sent at 07/24/2015  5:04 PM EDT ----- Labs are okay. How is breathing.

## 2015-07-30 NOTE — Telephone Encounter (Signed)
Prescription sent to patient's pharmacy.

## 2015-07-31 ENCOUNTER — Other Ambulatory Visit: Payer: Medicare Other

## 2015-08-04 ENCOUNTER — Ambulatory Visit (HOSPITAL_COMMUNITY): Payer: Medicare Other

## 2015-08-04 ENCOUNTER — Ambulatory Visit (HOSPITAL_COMMUNITY)
Admission: RE | Admit: 2015-08-04 | Discharge: 2015-08-04 | Disposition: A | Discharge status: Home / Self Care | Payer: Medicare Other | Source: Ambulatory Visit | Attending: Pulmonary Disease | Admitting: Pulmonary Disease

## 2015-08-04 DIAGNOSIS — J984 Other disorders of lung: Secondary | ICD-10-CM | POA: Diagnosis not present

## 2015-08-04 DIAGNOSIS — N200 Calculus of kidney: Secondary | ICD-10-CM | POA: Diagnosis not present

## 2015-08-04 DIAGNOSIS — M19011 Primary osteoarthritis, right shoulder: Secondary | ICD-10-CM | POA: Insufficient documentation

## 2015-08-04 DIAGNOSIS — R0609 Other forms of dyspnea: Secondary | ICD-10-CM | POA: Insufficient documentation

## 2015-08-04 DIAGNOSIS — R918 Other nonspecific abnormal finding of lung field: Secondary | ICD-10-CM | POA: Diagnosis not present

## 2015-08-04 DIAGNOSIS — R0600 Dyspnea, unspecified: Secondary | ICD-10-CM

## 2015-08-04 DIAGNOSIS — R05 Cough: Secondary | ICD-10-CM | POA: Diagnosis not present

## 2015-08-04 DIAGNOSIS — I700 Atherosclerosis of aorta: Secondary | ICD-10-CM | POA: Diagnosis not present

## 2015-08-04 DIAGNOSIS — M19012 Primary osteoarthritis, left shoulder: Secondary | ICD-10-CM | POA: Insufficient documentation

## 2015-08-04 DIAGNOSIS — R079 Chest pain, unspecified: Secondary | ICD-10-CM | POA: Insufficient documentation

## 2015-08-04 DIAGNOSIS — R590 Localized enlarged lymph nodes: Secondary | ICD-10-CM | POA: Diagnosis not present

## 2015-08-04 DIAGNOSIS — R0602 Shortness of breath: Secondary | ICD-10-CM | POA: Insufficient documentation

## 2015-08-04 DIAGNOSIS — I2510 Atherosclerotic heart disease of native coronary artery without angina pectoris: Secondary | ICD-10-CM | POA: Diagnosis not present

## 2015-08-04 DIAGNOSIS — J189 Pneumonia, unspecified organism: Secondary | ICD-10-CM

## 2015-08-04 DIAGNOSIS — K8020 Calculus of gallbladder without cholecystitis without obstruction: Secondary | ICD-10-CM | POA: Diagnosis not present

## 2015-08-10 ENCOUNTER — Encounter (HOSPITAL_BASED_OUTPATIENT_CLINIC_OR_DEPARTMENT_OTHER): Payer: Federal, State, Local not specified - PPO

## 2015-08-20 ENCOUNTER — Ambulatory Visit (INDEPENDENT_AMBULATORY_CARE_PROVIDER_SITE_OTHER): Payer: Medicare Other | Admitting: Internal Medicine

## 2015-08-20 ENCOUNTER — Encounter: Payer: Self-pay | Admitting: Internal Medicine

## 2015-08-20 ENCOUNTER — Other Ambulatory Visit (INDEPENDENT_AMBULATORY_CARE_PROVIDER_SITE_OTHER): Payer: Medicare Other | Admitting: *Deleted

## 2015-08-20 VITALS — BP 122/72 | HR 65 | Temp 98.4°F | Resp 12 | Wt 275.2 lb

## 2015-08-20 DIAGNOSIS — I200 Unstable angina: Secondary | ICD-10-CM

## 2015-08-20 DIAGNOSIS — E1159 Type 2 diabetes mellitus with other circulatory complications: Secondary | ICD-10-CM

## 2015-08-20 DIAGNOSIS — Z23 Encounter for immunization: Secondary | ICD-10-CM

## 2015-08-20 LAB — POCT GLYCOSYLATED HEMOGLOBIN (HGB A1C): HEMOGLOBIN A1C: 10.5

## 2015-08-20 MED ORDER — INSULIN ASPART PROT & ASPART (70-30 MIX) 100 UNIT/ML ~~LOC~~ SUSP: SUBCUTANEOUS | Status: DC

## 2015-08-20 MED ORDER — INSULIN SYRINGES (DISPOSABLE) U-100 1 ML MISC: Status: DC

## 2015-08-20 NOTE — Progress Notes (Signed)
Patient ID: Edward [name], male   DOB: 1941-06-22, 74 y.o.   MRN: [id number]  HPI: Edward [name] is a 74 y.o.-year-old male, returning for f/u for DM2,  dx 1987, insulin-dependent since 1989, uncontrolled, with complications (CAD, PN). [place] moved from [place] in 12/2013. Last visit 2 mo ago.  Last hemoglobin A1c was:  Lab Results  Component Value Date   HGBA1C 10.5 05/20/2015   HGBA1C 8.9* 02/09/2015   HGBA1C 11.1* 12/02/2014   We tried to switch to U500 insulin - could not get it as it was 400$ >> given him coupons: - 12-14 units before each meal Could not afford it. He changed insurances >> will check to see if able to get U500 (new ins since 08/15/2015).  He is on: Insulin  Before breakfast  Before lunch  Before dinner  Bedtime  Novolin 70/30  60 60 60 60  Novolin 40 40 40   He was on Metformin >> N/V/D.  Pt checks his sugars 4-5 a day: - am: 180-270 >> 100-331 (436) >> 128-344 >> 52, 60, 79-339 >> 90-120s >> 150-240 >> 125, 156-200, 265 - 2h after b'fast:200 >> 78 x1, 209-337 >> 128-263, 380 >> 170-180 >> 120-125 >> n/c - before lunch: 280-310 >> 216, 294, 402 >> 179-370 >> 68, 178-299, 319  >> 130s >> 200 >> 125,135-169, 212 - 2h after lunch: n/c >> 222, 375, 545 >> 198-309 >> 121-254 >> 170-180s >> n/c >> n/c - before dinner: 280s >> 177 >> 67 x1, 175-312 >> 302, 395 >> 170s >> 200 >> 123, 134-212 - 2h after dinner: n/c >> 301-379>> 99, 128, 368 >> n/c  - bedtime: 200-300 >> 190-463 >> 46, 284 >> <200 >> 190-200 >> 93-198 - nighttime: n/c >> 300s >> 139-339 >> n/c Lowest sugar was 93 - he has hypoglycemia awareness at 90  Highest sugar was 398 >> 200s.  Has a One Touch Ultra meter.   Pt's meals are: - Breakfast: eggs, oatmeal, grits, bacon - Lunch: baked chiken + vegetables - Dinner: same as lunch - Snacks: sweets - cookies Rarely fast food. He gave up sodas. He still drink fruit juice.  - mild CKD, last BUN/creatinine:  Lab Results  Component Value Date   BUN 14  07/24/2015   CREATININE 1.13 07/24/2015  On Lisinopril. ACR 37.6 in 02/11/2014) - Has HL. Last set of lipids: Lab Results  Component Value Date   CHOL 197 07/08/2015   HDL 40.50 07/08/2015   LDLCALC 100* 02/09/2015   LDLDIRECT 144.0 07/08/2015   TRIG 242.0* 07/08/2015   CHOLHDL 5 07/08/2015  Not on a statin. He gets leg cramps from Lipitor >> cannot tolerate it. - last eye exam was in 04/2015 (Dr Lanell Matar) 10/2014 (Dr Zigmund Daniel). Has DR. Had cataracts sx.  - + numbness and tingling in his feet. He saw podiatrist (05/2015) >> has PN. On ASA 81.  I reviewed pt's medications, allergies, PMH, social hx, family hx, and changes were documented in the history of present illness. Otherwise, unchanged from my initial visit note.  He had surgery for his R shoulder in 07/30/2014.   ROS: Constitutional: + weight gain, + fatigue, no hot flushes, + poor sleep Eyes: + blurry vision, no xerophthalmia ENT: no sore throat, no nodules palpated in throat, no dysphagia/odynophagia, no hoarseness Cardiovascular: + CP/no SOB/no palpitations/no leg swelling Respiratory: + cough/no SOB/+ wheezing Gastrointestinal: no N/V/D/+ C/no heartburn Musculoskeletal: + muscle aches/no joint aches  Skin: no rash Neurological: + tremors/no numbness/tingling/dizziness +  low libido, + diff with erections PE: BP 122/72 mmHg  Pulse 65  Temp(Src) 98.4 F (36.9 C) (Oral)  Resp 12  Wt 275 lb 3.2 oz (124.83 kg)  SpO2 95% Body mass index is 41.85 kg/(m^2). Wt Readings from Last 3 Encounters:  08/20/15 275 lb 3.2 oz (124.83 kg)  07/29/15 262 lb (118.842 kg)  07/27/15 263 lb (119.296 kg)   Constitutional: obese, in NAD Eyes: PERRLA, EOMI, no exophthalmos ENT: moist mucous membranes, no thyromegaly, no cervical lymphadenopathy Cardiovascular: RRR, No MRG, + B Ankle swelling Respiratory: CTA B Gastrointestinal: abdomen soft, NT, ND, BS+ Musculoskeletal: no deformities, strength intact in all 4 Skin: moist, warm, no  rashes Neurological: no tremor with outstretched hands, DTR normal in all 4  ASSESSMENT: 1. DM2, insulin-dependent, uncontrolled, with complications - CAD H/o steroid inj's  PLAN:  1. Patient with long-standing, uncontrolled diabetes, on premixed + regular insulin regimen. We tried to start U500 insulin, and this worked great, with significant decrease in HbA1c and feeling much better, but he could not get it because this was too expensive. He also could not get the 70/30 insulin for several mo >> sugars much higher then and - started again the premixed insulin <1 mo ago. He has a new insurance >> will check with them about getting the U500 ins. - sugars higher in am >> will increase the 70/30 at night, leave the rest of the doses the same. - I suggested to:  Patient Instructions   Please increase the 70/30 insulin at bedtime: Insulin  Before breakfast  Before lunch  Before dinner  Bedtime  Novolin 70/30  60 60 60 60 >> 70  Novolin 40 40 40    Please let me know if your insurance approves U500 insulin.  Please return in 1.5 months with your sugar log.   - continue checking sugars at different times of the day - check 3-4 times a day, rotating checks - up to date with yearly eye exams  - checked HbA1c >> 10.5% (stable, high) - Return to clinic in 1.5 mo with sugar log

## 2015-08-20 NOTE — Patient Instructions (Signed)
Please increase the 70/30 insulin at bedtime: Insulin  Before breakfast  Before lunch  Before dinner  Bedtime  Novolin 70/30  60 60 60 60 >> 70  Novolin 40 40 40    Please let me know if your insurance approves U500 insulin.  Please return in 1.5 months with your sugar log.

## 2015-08-25 ENCOUNTER — Other Ambulatory Visit: Payer: Self-pay | Admitting: Internal Medicine

## 2015-08-25 ENCOUNTER — Ambulatory Visit (INDEPENDENT_AMBULATORY_CARE_PROVIDER_SITE_OTHER): Payer: Medicare Other | Admitting: Interventional Cardiology

## 2015-08-25 ENCOUNTER — Encounter: Payer: Self-pay | Admitting: Interventional Cardiology

## 2015-08-25 VITALS — BP 122/60 | HR 71 | Ht 68.0 in | Wt 273.6 lb

## 2015-08-25 DIAGNOSIS — I200 Unstable angina: Secondary | ICD-10-CM

## 2015-08-25 DIAGNOSIS — R0600 Dyspnea, unspecified: Secondary | ICD-10-CM

## 2015-08-25 DIAGNOSIS — G4733 Obstructive sleep apnea (adult) (pediatric): Secondary | ICD-10-CM | POA: Diagnosis not present

## 2015-08-25 DIAGNOSIS — I2510 Atherosclerotic heart disease of native coronary artery without angina pectoris: Secondary | ICD-10-CM | POA: Diagnosis not present

## 2015-08-25 DIAGNOSIS — Z9861 Coronary angioplasty status: Secondary | ICD-10-CM

## 2015-08-25 DIAGNOSIS — E785 Hyperlipidemia, unspecified: Secondary | ICD-10-CM

## 2015-08-25 DIAGNOSIS — I10 Essential (primary) hypertension: Secondary | ICD-10-CM | POA: Diagnosis not present

## 2015-08-25 DIAGNOSIS — E1159 Type 2 diabetes mellitus with other circulatory complications: Secondary | ICD-10-CM

## 2015-08-25 MED ORDER — PRASUGREL HCL 10 MG PO TABS: 10.0000 mg | ORAL_TABLET | Freq: Every day | ORAL | Status: DC

## 2015-08-25 MED ORDER — INSULIN REGULAR HUMAN (CONC) 500 UNIT/ML ~~LOC~~ SOLN: SUBCUTANEOUS | Status: DC

## 2015-08-25 NOTE — Progress Notes (Signed)
Cardiology Office Note   Date:  08/25/2015   ID:  Edward [name], DOB [birth year], MRN [id number]  PCP:  Garnet Koyanagi, DO  Cardiologist:  Sinclair Grooms, MD   Chief Complaint  Patient presents with  . Shortness of Breath      History of Present Illness: Edward [name] is a 74 y.o. male who presents for CAD, LAD DES April 2016, hypertension, hyperlipidemia, TIA, and progressive shortness of breath.  Since stenting the LAD the patient continues to have dyspnea. It seems to be episodic. Early on, I was concerned that Brilinta was causing the sensation of dyspnea. We switched to Plavix, but he had other complaints that prevented using that agent. He still has some exertional angina but markedly improved. He has wheezing and cough on occasion. He complains of intermittent orthopnea, which has not gotten better with the addition of a loop diuretic. BNP levels are less than 100. He does have trace lower extremity edema. A recent CT scan demonstrated the possibility of some interstitial scarring or edema for which he will see pulmonology.the current diuretic regimen includes furosemide 80 mg per day and hydrochlorothiazide 12.5 mg daily.    Past Medical History  Diagnosis Date  . Hypertension   . CAD (coronary artery disease)     a. Pt reports "small heart attack" in 2013 at [facility], denies need for stent or CABG, does not know findings of cath.  [facility] TIA (transient ischemic attack) 2002  . Dyslipidemia, goal LDL below 70 01/29/2014  . GERD (gastroesophageal reflux disease)   . Enlarged prostate   . Shortness of breath     exertion  . DM type 2 (diabetes mellitus, type 2), insulin dependent 01/29/2014    fasting cbg 50-120 with new regimen  . Anxiety   . Heart attack ([place]) 02/03/2014    "mild heart attack"  . Pneumonia 01/2013  . Pneumonia 03/2014    hospitalized  . Sleep apnea     does not use cpap (06/04/2015)  . MAUQJFHL(456.2)     "monthly" (06/04/2015)  .  Arthritis     'all over"  . Tremor of both hands   . Adenomatous colon polyp 04/2011  . Diverticulosis   . Internal hemorrhoid     Past Surgical History  Procedure Laterality Date  . Total knee arthroplasty Bilateral   . Lumbar disc surgery    . Cataract extraction Bilateral   . Joint replacement    . Back surgery    . Cataract extraction, bilateral Bilateral   . Shoulder arthroscopy Right 07/30/2014    Procedure: Right Shoulder Arthroscopy, Debridement, Decompression, Manipulation Under Anesthesia;  Surgeon: Newt Minion, MD;  Location: [facility];  Service: Orthopedics;  Laterality: Right;  . Left heart catheterization with coronary angiogram N/A 01/28/2014    Procedure: LEFT HEART CATHETERIZATION WITH CORONARY ANGIOGRAM;  Surgeon: Sinclair Grooms, MD;  Location: [facility] CATH LAB;  Service: Cardiovascular;  Laterality: N/A;  . Left heart catheterization with coronary angiogram N/A 01/30/2015    Procedure: LEFT HEART CATHETERIZATION WITH CORONARY ANGIOGRAM;  Surgeon: Belva Crome, MD;  Location: [facility] CATH LAB;  Service: Cardiovascular;  Laterality: N/A;  . Cardiac catheterization  01/28/14    + CAD treat medically  . Coronary angioplasty with stent placement  01/30/2015    DES Promus  Premier to LAD by Dr Tamala Julian     Current Outpatient Prescriptions  Medication Sig Dispense Refill  . amLODipine (NORVASC) 10 MG tablet  Take 10 mg by mouth daily.  1  . aspirin EC 81 MG tablet Take 81 mg by mouth daily.    . Blood Glucose Monitoring Suppl (ONE TOUCH ULTRA SYSTEM KIT) W/DEVICE KIT Use to test blood sugar daily as instructed. Dx code: E11.59 1 each 0  . Ciclopirox 1 % shampoo Apply 1 application topically at bedtime. At night after showering  0  . clonazePAM (KLONOPIN) 1 MG tablet Take 1 tablet (1 mg total) by mouth at bedtime as needed for anxiety. 30 tablet 0  . dicyclomine (BENTYL) 10 MG capsule Take 1 capsule (10 mg total) by mouth 3 (three) times daily before meals. 90 capsule 5  . esomeprazole  (NEXIUM) 40 MG capsule Take 40 mg by mouth daily at 12 noon.    . fenofibrate 160 MG tablet Take 160 mg by mouth daily.  0  . furosemide (LASIX) 40 MG tablet Take 1 tablet (40 mg total) by mouth daily. 30 tablet 1  . glucose blood (BL TEST STRIP PACK) test strip Use to test blood sugar 4 times daily as instructed. Dx code: 250.00 200 each 4  . hydrochlorothiazide (HYDRODIURIL) 12.5 MG tablet Take 2 tablets (25 mg total) by mouth daily. 30 tablet 11  . insulin aspart (NOVOLOG) 100 UNIT/ML injection Inject 40 Units into the skin 3 (three) times daily before meals. before breakfast, lunch and supper    . insulin aspart protamine- aspart (NOVOLOG MIX 70/30) (70-30) 100 UNIT/ML injection Inject 60 units into the skin before each meal and 70 units before bedtime as directed. 60 mL 1  . Insulin Syringes, Disposable, U-100 1 ML MISC Use 4x a day 200 each 11  . metoprolol (LOPRESSOR) 50 MG tablet Take 1 tablet (50 mg total) by mouth 2 (two) times daily. 60 tablet 6  . nitroGLYCERIN (NITROSTAT) 0.4 MG SL tablet Place 1 tablet (0.4 mg total) under the tongue every 5 (five) minutes as needed for chest pain (CP or SOB). 25 tablet 4  . oxyCODONE-acetaminophen (PERCOCET/ROXICET) 5-325 MG tablet Take 1 tablet by mouth every 12 (twelve) hours as needed. (PAIN)  0  . Polyvinyl Alcohol-Povidone (REFRESH OP) Place 1 drop into both eyes daily as needed (for dry eyes).    . potassium chloride SA (K-DUR,KLOR-CON) 20 MEQ tablet Take 1 tablet (20 mEq total) by mouth daily. 30 tablet 0  . pravastatin (PRAVACHOL) 40 MG tablet Take 1 tablet (40 mg total) by mouth daily. 30 tablet 5  . propranolol (INDERAL) 40 MG tablet Take 1 tablet (40 mg total) by mouth 2 (two) times daily. 60 tablet 11  . ticagrelor (BRILINTA) 90 MG TABS tablet Take 90 mg by mouth 2 (two) times daily.    . traZODone (DESYREL) 50 MG tablet Take 0.5-1 tablets (25-50 mg total) by mouth at bedtime as needed for sleep. 30 tablet 1  . VENTOLIN HFA 108 (90 BASE)  MCG/ACT inhaler Inhale 2 puffs into the lungs every 6 (six) hours as needed. (SHORTNESS OR BREATH / WHEEZING)  0   No current facility-administered medications for this visit.    Allergies:   Review of patient's allergies indicates no known allergies.    Social History:  The patient  reports that he has never smoked. He has never used smokeless tobacco. He reports that he does not drink alcohol or use illicit drugs.   Family History:  The patient's family history includes Alzheimer's disease in his sister; Asthma in his brother; CAD in his brother and sister; Diabetes in  his other; Prostate cancer in his brother.    ROS:  Please see the history of present illness.   Otherwise, review of systems are positive for cough, dyspnea particularly on exertion, lower extremity swelling, tremor, PND, chest pressure, wheezing, snoring, anxiety, and difficulties with balance..   All other systems are reviewed and negative.    PHYSICAL EXAM: VS:  BP 122/60 mmHg  Pulse 71  Ht '5\' 8"'  (1.727 m)  Wt 124.104 kg (273 lb 9.6 oz)  BMI 41.61 kg/m2  SpO2 97% , BMI Body mass index is 41.61 kg/(m^2). GEN: Well nourished, well developed, in no acute distressPeriod morbid obesity HEENT: normal Neck: no JVD, carotid bruits, or masses Cardiac: RRR.  There is no murmur, rub, or gallop. There is no edema. Respiratory:  clear to auscultation bilaterally, normal work of breathing. GI: soft, nontender, nondistended, + BS MS: no deformity or atrophy Skin: warm and dry, no rash Neuro:  Strength and sensation are intact Psych: euthymic mood, full affect   EKG:  EKG is not ordered today.    Recent Labs: 12/02/2014: TSH 1.041 06/04/2015: B Natriuretic Peptide 35.1 07/08/2015: ALT 16; Hemoglobin 14.3; Platelets 239.0 07/24/2015: BUN 14; Creatinine, Ser 1.13; Potassium 4.1; Pro B Natriuretic peptide (BNP) 24.0; Sodium 137    Lipid Panel    Component Value Date/Time   CHOL 197 07/08/2015 1344   TRIG 242.0* 07/08/2015  1344   HDL 40.50 07/08/2015 1344   CHOLHDL 5 07/08/2015 1344   VLDL 48.4* 07/08/2015 1344   LDLCALC 100* 02/09/2015 1210   LDLDIRECT 144.0 07/08/2015 1344      Wt Readings from Last 3 Encounters:  08/25/15 124.104 kg (273 lb 9.6 oz)  08/20/15 124.83 kg (275 lb 3.2 oz)  07/29/15 118.842 kg (262 lb)      Other studies Reviewed: Additional studies/ records that were reviewed today include: Review of recent laboratory data.. The findings include review of CT scan.  CT SCAN 08/04/15: IMPRESSION: 1. Mild interlobular septal thickening throughout the periphery of both lungs. Given the absence of ground-glass attenuation, traction bronchiectasis, architectural distortion and honeycombing, the mild interlobular septal thickening is favored to represent mild pulmonary edema. Minimal nonspecific interstitial pneumonia (NSIP) is considered less likely. Consider a follow-up high-resolution chest CT study in 12 months to assess for pattern stability. 2. Small focus of postinfectious scarring in the lingula associated with a tiny calcified granuloma. 3. Atherosclerosis, including left main and 3 vessel coronary artery disease. Please note that although the presence of coronary artery calcium documents the presence of coronary artery disease, the severity of this disease and any potential stenosis cannot be assessed on this non-gated CT examination. 4. Mild subcarinal lymphadenopathy, nonspecific, likely reactive. 5. Cholelithiasis. 6. Left nephrolithiasis.  ASSESSMENT AND PLAN:  1. CAD S/P LAD DES 01/30/15 Still having angina but no anatomic explanation for why. Angina is much improved and rare compared to presentation in April  2. Essential hypertension Controlled  3. Dyspnea Etiology unclear. Discontinue Brilinta and start Effient. Not improved after diuretic therapy intensified. Will be seen pulmonary later this week.   4. OSA (obstructive sleep apnea) To have reevaluation  soon  5. Dyslipidemia, goal LDL below 70 On therapy    Current medicines are reviewed at length with the patient today.  The patient has the following concerns regarding medicines: Whether Brilinta could be causing dyspnea.  The following changes/actions have been instituted:    Discontinue Brilinta  Start Effient 10 mg daily including today  Pulmonary consult  Continue  current diuretic regimen  May need more aggressive diuresis, but we will see what her pulmonary status.  Labs/ tests ordered today include:  No orders of the defined types were placed in this encounter.     Disposition:   FU with HS in 6 months  Signed, Sinclair Grooms, MD  08/25/2015 9:50 AM    [facility] [address], [place], [place]  [address] Phone: [phone]; Fax: [phone]

## 2015-08-25 NOTE — Patient Instructions (Signed)
Medication Instructions:  Stop Brilinta.  Start Effient 10mg  daily-Dr Tamala Julian wants you to take a dose today.  Labwork: None today  Testing/Procedures: None today  Follow-Up: Your physician wants you to follow-up in: 6 months with Dr Tamala Julian. (April 2017).  You will receive a reminder letter in the mail two months in advance. If you don't receive a letter, please call our office to schedule the follow-up appointment.        If you need a refill on your cardiac medications before your next appointment, please call your pharmacy.

## 2015-08-27 ENCOUNTER — Ambulatory Visit (INDEPENDENT_AMBULATORY_CARE_PROVIDER_SITE_OTHER): Payer: Medicare Other | Admitting: Pulmonary Disease

## 2015-08-27 ENCOUNTER — Telehealth: Payer: Self-pay | Admitting: Interventional Cardiology

## 2015-08-27 ENCOUNTER — Encounter: Payer: Self-pay | Admitting: Pulmonary Disease

## 2015-08-27 VITALS — BP 132/68 | HR 66 | Ht 68.0 in | Wt 274.2 lb

## 2015-08-27 DIAGNOSIS — J849 Interstitial pulmonary disease, unspecified: Secondary | ICD-10-CM

## 2015-08-27 DIAGNOSIS — R0600 Dyspnea, unspecified: Secondary | ICD-10-CM | POA: Diagnosis not present

## 2015-08-27 DIAGNOSIS — K219 Gastro-esophageal reflux disease without esophagitis: Secondary | ICD-10-CM | POA: Diagnosis not present

## 2015-08-27 DIAGNOSIS — I200 Unstable angina: Secondary | ICD-10-CM

## 2015-08-27 DIAGNOSIS — G4733 Obstructive sleep apnea (adult) (pediatric): Secondary | ICD-10-CM

## 2015-08-27 DIAGNOSIS — N410 Acute prostatitis: Secondary | ICD-10-CM | POA: Diagnosis not present

## 2015-08-27 DIAGNOSIS — R350 Frequency of micturition: Secondary | ICD-10-CM | POA: Diagnosis not present

## 2015-08-27 DIAGNOSIS — J209 Acute bronchitis, unspecified: Secondary | ICD-10-CM | POA: Insufficient documentation

## 2015-08-27 DIAGNOSIS — R3915 Urgency of urination: Secondary | ICD-10-CM | POA: Diagnosis not present

## 2015-08-27 MED ORDER — DOXYCYCLINE HYCLATE 100 MG PO TABS: 100.0000 mg | ORAL_TABLET | Freq: Two times a day (BID) | ORAL | Status: DC

## 2015-08-27 NOTE — Progress Notes (Signed)
Subjective:    Patient ID: Edward [name], male    DOB: 04/06/1941, 74 y.o.   MRN: [id number]  Synopsis: Referred in 2016 for dyspnea, has a history of asthma.  Never smoked. He has significant acid reflux. He also has coronary artery disease. 1/15 Hgb 12.10 July 2015 pulmonary function testing ratio 82%, FEV1 2.10 L (81% predicted), total lung capacity 4.21 L (63% predicted), DLCO 21.17 (71% predicted)  08/2015 HRCT> mild GGO, likley pulmonary edema and less likely NSIP; 3 vessel CAD noted, mild likley reactive lymphadenopathy Pt diagnosed with osa years ago up [place] and had a cpap but stopped using it He is requesting a referral because he feels he may need the cpap  HPI Chief Complaint  Patient presents with  . Follow-up    Pt c/o wet cough with white mucus, worse in AM, occasional wheeze, dyspnea with exertion and occasional CP/tightness. Pt does use his albuterol HFA every morning. Pt also c/o of increased fatigue. Pt has not has sleep study yet, scheduled for 12/16. Review CT.   Edward [name] says that he's continuing to struggle with shortness of breath. He tried exercise recently and he could only walk about a block before he had to stop. He takes albuterol which only helps a little bit. He has significant cough, mucus production, and wheeze particularly in the mornings. This has been worse for the last week or 2. He says he feels like previous episodes of bronchitis. Denies fevers chills, chest pain. However, he will have some chest tightness from time to time when he gets short of breath. He says that the cough and mucus production occurs primarily in the mornings. He has been using albuterol and he recently had his medications changed for both acid reflux and coronary artery disease.  Past Medical History  Diagnosis Date  . Hypertension   . CAD (coronary artery disease)     a. Pt reports "small heart attack" in 2013 at [facility], denies need for stent or CABG,  does not know findings of cath.  [facility] TIA (transient ischemic attack) 2002  . Dyslipidemia, goal LDL below 70 01/29/2014  . GERD (gastroesophageal reflux disease)   . Enlarged prostate   . Shortness of breath     exertion  . DM type 2 (diabetes mellitus, type 2), insulin dependent 01/29/2014    fasting cbg 50-120 with new regimen  . Anxiety   . Heart attack ([place]) 02/03/2014    "mild heart attack"  . Pneumonia 01/2013  . Pneumonia 03/2014    hospitalized  . Sleep apnea     does not use cpap (06/04/2015)  . ZMCEYEMV(361.2)     "monthly" (06/04/2015)  . Arthritis     'all over"  . Tremor of both hands   . Adenomatous colon polyp 04/2011  . Diverticulosis   . Internal hemorrhoid       Review of Systems  Constitutional: Positive for fatigue. Negative for fever and chills.  HENT: Negative for postnasal drip, rhinorrhea and sinus pressure.   Respiratory: Positive for shortness of breath. Negative for cough and wheezing.   Cardiovascular: Positive for leg swelling. Negative for chest pain and palpitations.       Objective:   Physical Exam Filed Vitals:   08/27/15 0957  BP: 132/68  Pulse: 66  Height: '5\' 8"'  (1.727 m)  Weight: 274 lb 3.2 oz (124.376 kg)  SpO2: 98%   RA  Related 500 feet in office O2 sat duration remained above  97%  Gen: chronically ill appearing HENT: OP clear, TM's clear, neck supple PULM: CTA B, normal percussion CV: RRR, no mgr, trace edema GI: BS+, soft, nontender Derm: no cyanosis or rash Psyche: normal mood and affect  Records from Dr. Fuller Plan reviewed where he was treated for his GERD PFT and CT images reviewed     Assessment & Plan:  OSA (obstructive sleep apnea) We will follow-up the CPAP titration study which is scheduled for December 2016.   Dyspnea Edward [name] has dyspnea which is multifactorial. Primarily has obesity and deconditioning contributes significantly. There is very mild groundglass opacification in the lower lobes which radiology felt may  be pulmonary edema. On exam today he does appear to be slightly volume overloaded. However, what I think is more likely is that he has acid reflux leading to chronic microaspiration as he reports significant wheezing and mucus production in the mornings.  Of note, even though he says he has been told that he has asthma there is no evidence of airflow obstruction on his lung function testing and he has no wheezing on my exam today.  Plan: To monitor for possible interstitial lung disease I'm going to repeat lung function test in 6 months and then consider repeat imaging at some point a year from now I want him to diet, exercise, try to lose weight I will refer him to pulmonary rehabilitation for exercise Continue follow-up with Dr. Magda Paganini the head of bed and treat as reflux as detailed below  GERD (gastroesophageal reflux disease) Based on his symptoms are believed that acid reflux contributes to his abnormal CT scan findings and the cough and wheezy has in the mornings.  Plan: Continue antacid therapy as directed by Dr. Fuller Plan [facility] head of bed Acid reflux lifestyle modifications reviewed today  Acute bronchitis He has had cough with mucus production for over a week now. He says this is reminiscent of prior episodes of bronchitis.  Plan: Doxycycline 100 mg by mouth twice a day 5 days     Current outpatient prescriptions:  .  amLODipine (NORVASC) 10 MG tablet, Take 10 mg by mouth daily., Disp: , Rfl: 1 .  aspirin EC 81 MG tablet, Take 81 mg by mouth daily., Disp: , Rfl:  .  Blood Glucose Monitoring Suppl (ONE TOUCH ULTRA SYSTEM KIT) W/DEVICE KIT, Use to test blood sugar daily as instructed. Dx code: E11.59, Disp: 1 each, Rfl: 0 .  Ciclopirox 1 % shampoo, Apply 1 application topically at bedtime. At night after showering, Disp: , Rfl: 0 .  clonazePAM (KLONOPIN) 1 MG tablet, Take 1 tablet (1 mg total) by mouth at bedtime as needed for anxiety., Disp: 30 tablet, Rfl: 0 .   dicyclomine (BENTYL) 10 MG capsule, Take 1 capsule (10 mg total) by mouth 3 (three) times daily before meals., Disp: 90 capsule, Rfl: 5 .  esomeprazole (NEXIUM) 40 MG capsule, Take 40 mg by mouth daily at 12 noon., Disp: , Rfl:  .  fenofibrate 160 MG tablet, Take 160 mg by mouth daily., Disp: , Rfl: 0 .  furosemide (LASIX) 40 MG tablet, Take 1 tablet (40 mg total) by mouth daily., Disp: 30 tablet, Rfl: 1 .  glucose blood (BL TEST STRIP PACK) test strip, Use to test blood sugar 4 times daily as instructed. Dx code: 250.00, Disp: 200 each, Rfl: 4 .  hydrochlorothiazide (HYDRODIURIL) 12.5 MG tablet, Take 2 tablets (25 mg total) by mouth daily., Disp: 30 tablet, Rfl: 11 .  insulin aspart (NOVOLOG)  100 UNIT/ML injection, Inject 40 Units into the skin 3 (three) times daily before meals. before breakfast, lunch and supper, Disp: , Rfl:  .  insulin aspart protamine- aspart (NOVOLOG MIX 70/30) (70-30) 100 UNIT/ML injection, Inject 60 units into the skin before each meal and 70 units before bedtime as directed., Disp: 60 mL, Rfl: 1 .  insulin regular human CONCENTRATED (HUMULIN R) 500 UNIT/ML injection, Inject 0.14-0.18 ml (draw to 14-18 units on the insulin syringe) (70-90 units of insulin) 3x a day, 30 min before meals, Disp: 80 mL, Rfl: 1 .  Insulin Syringes, Disposable, U-100 1 ML MISC, Use 4x a day, Disp: 200 each, Rfl: 11 .  metoprolol (LOPRESSOR) 50 MG tablet, Take 1 tablet (50 mg total) by mouth 2 (two) times daily., Disp: 60 tablet, Rfl: 6 .  nitroGLYCERIN (NITROSTAT) 0.4 MG SL tablet, Place 1 tablet (0.4 mg total) under the tongue every 5 (five) minutes as needed for chest pain (CP or SOB)., Disp: 25 tablet, Rfl: 4 .  oxyCODONE-acetaminophen (PERCOCET/ROXICET) 5-325 MG tablet, Take 1 tablet by mouth every 12 (twelve) hours as needed. (PAIN), Disp: , Rfl: 0 .  Polyvinyl Alcohol-Povidone (REFRESH OP), Place 1 drop into both eyes daily as needed (for dry eyes)., Disp: , Rfl:  .  potassium chloride SA  (K-DUR,KLOR-CON) 20 MEQ tablet, Take 1 tablet (20 mEq total) by mouth daily., Disp: 30 tablet, Rfl: 0 .  prasugrel (EFFIENT) 10 MG TABS tablet, Take 1 tablet (10 mg total) by mouth daily., Disp: 30 tablet, Rfl: 6 .  pravastatin (PRAVACHOL) 40 MG tablet, Take 1 tablet (40 mg total) by mouth daily., Disp: 30 tablet, Rfl: 5 .  propranolol (INDERAL) 40 MG tablet, Take 1 tablet (40 mg total) by mouth 2 (two) times daily., Disp: 60 tablet, Rfl: 11 .  traZODone (DESYREL) 50 MG tablet, Take 0.5-1 tablets (25-50 mg total) by mouth at bedtime as needed for sleep., Disp: 30 tablet, Rfl: 1 .  VENTOLIN HFA 108 (90 BASE) MCG/ACT inhaler, Inhale 2 puffs into the lungs every 6 (six) hours as needed. (SHORTNESS OR BREATH / WHEEZING), Disp: , Rfl: 0 .  doxycycline (VIBRA-TABS) 100 MG tablet, Take 1 tablet (100 mg total) by mouth 2 (two) times daily., Disp: 10 tablet, Rfl: 0

## 2015-08-27 NOTE — Assessment & Plan Note (Signed)
We will follow-up the CPAP titration study which is scheduled for December 2016.

## 2015-08-27 NOTE — Assessment & Plan Note (Signed)
He has had cough with mucus production for over a week now. He says this is reminiscent of prior episodes of bronchitis.  Plan: Doxycycline 100 mg by mouth twice a day 5 days

## 2015-08-27 NOTE — Telephone Encounter (Signed)
Walk in pt form-sealed envelope dropped off-gave to [employer]

## 2015-08-27 NOTE — Assessment & Plan Note (Signed)
Edward [name] has dyspnea which is multifactorial. Primarily has obesity and deconditioning contributes significantly. There is very mild groundglass opacification in the lower lobes which radiology felt may be pulmonary edema. On exam today he does appear to be slightly volume overloaded. However, what I think is more likely is that he has acid reflux leading to chronic microaspiration as he reports significant wheezing and mucus production in the mornings.  Of note, even though he says he has been told that he has asthma there is no evidence of airflow obstruction on his lung function testing and he has no wheezing on my exam today.  Plan: To monitor for possible interstitial lung disease I'm going to repeat lung function test in 6 months and then consider repeat imaging at some point a year from now I want him to diet, exercise, try to lose weight I will refer him to pulmonary rehabilitation for exercise Continue follow-up with Dr. Magda Paganini the head of bed and treat as reflux as detailed below

## 2015-08-27 NOTE — Assessment & Plan Note (Signed)
Based on his symptoms are believed that acid reflux contributes to his abnormal CT scan findings and the cough and wheezy has in the mornings.  Plan: Continue antacid therapy as directed by Dr. Fuller Plan Elevate head of bed Acid reflux lifestyle modifications reviewed today

## 2015-08-28 ENCOUNTER — Telehealth (HOSPITAL_COMMUNITY): Payer: Self-pay

## 2015-08-28 NOTE — Telephone Encounter (Signed)
I have called and left a message with [facility] to inquire about participation in Pulmonary Rehab per Dr. Anastasia Pall referral. Will send letter in mail and follow up.

## 2015-08-30 ENCOUNTER — Encounter: Payer: Self-pay | Admitting: Family Medicine

## 2015-08-31 ENCOUNTER — Other Ambulatory Visit: Payer: Self-pay | Admitting: Family Medicine

## 2015-08-31 ENCOUNTER — Telehealth (HOSPITAL_COMMUNITY): Payer: Self-pay

## 2015-08-31 DIAGNOSIS — F419 Anxiety disorder, unspecified: Secondary | ICD-10-CM

## 2015-08-31 MED ORDER — CLONAZEPAM 1 MG PO TABS: 1.0000 mg | ORAL_TABLET | Freq: Every evening | ORAL | Status: DC | PRN

## 2015-08-31 NOTE — Telephone Encounter (Signed)
Please review and advise     KP 

## 2015-08-31 NOTE — Telephone Encounter (Signed)
rx printed

## 2015-08-31 NOTE — Telephone Encounter (Signed)
Patient called Pulmonary Rehab and stated that he is too busy at this time to commit to Pulmonary Rehab.  Patient stated that he will be interested at the beginning of next year. Patient will follow up.

## 2015-09-01 ENCOUNTER — Ambulatory Visit (INDEPENDENT_AMBULATORY_CARE_PROVIDER_SITE_OTHER): Payer: Medicare Other | Admitting: Ophthalmology

## 2015-09-01 DIAGNOSIS — E11319 Type 2 diabetes mellitus with unspecified diabetic retinopathy without macular edema: Secondary | ICD-10-CM

## 2015-09-01 DIAGNOSIS — E113392 Type 2 diabetes mellitus with moderate nonproliferative diabetic retinopathy without macular edema, left eye: Secondary | ICD-10-CM | POA: Diagnosis not present

## 2015-09-01 DIAGNOSIS — H43813 Vitreous degeneration, bilateral: Secondary | ICD-10-CM

## 2015-09-01 DIAGNOSIS — H35033 Hypertensive retinopathy, bilateral: Secondary | ICD-10-CM | POA: Diagnosis not present

## 2015-09-01 DIAGNOSIS — E113591 Type 2 diabetes mellitus with proliferative diabetic retinopathy without macular edema, right eye: Secondary | ICD-10-CM

## 2015-09-01 DIAGNOSIS — I10 Essential (primary) hypertension: Secondary | ICD-10-CM | POA: Diagnosis not present

## 2015-09-02 ENCOUNTER — Telehealth: Payer: Self-pay | Admitting: Internal Medicine

## 2015-09-03 NOTE — Telephone Encounter (Signed)
Error

## 2015-09-14 ENCOUNTER — Telehealth: Payer: Self-pay | Admitting: Interventional Cardiology

## 2015-09-14 NOTE — Telephone Encounter (Signed)
Follow up      Patient calling the office for samples of medication:   1.  What medication and dosage are you requesting samples for? effient 10mg  2.  Are you currently out of this medication? yes

## 2015-09-14 NOTE — Telephone Encounter (Signed)
Returned pt call. Adv pt that our office will call him to pick up his medication once we receive it.   Pt would like Dr.Smith updated. He is doing well since switching to Effient. FYI fwd to Dr.Smith

## 2015-09-14 NOTE — Telephone Encounter (Signed)
Called pt to inform him that we did not have any samples of Effient 10 mg tablet but he could give our office a call back later in the week to see if we have gotten some samples in. Pt wanted to know also about his application that he turn in for patient assistant. Please advise

## 2015-09-14 NOTE — Telephone Encounter (Signed)
New Message    Pt calling stating that [employer] sent his Effient to our office. Pt wants to know how to go about getting it or if he will get a call once our office receives it. Please call back and advise.

## 2015-09-14 NOTE — Telephone Encounter (Signed)
Follow up      Calling to see if you received the form pt dropped off to get assistance for effient.  Please call

## 2015-09-15 ENCOUNTER — Telehealth: Payer: Self-pay

## 2015-09-15 NOTE — Telephone Encounter (Signed)
Samples of Effient 10mg  6 bottles, provided to patient. Apparently, his PA application did get faxed to [place].

## 2015-09-15 NOTE — Telephone Encounter (Signed)
Samples left at the front desk for pt to pick up. Pt is aware.

## 2015-09-21 ENCOUNTER — Other Ambulatory Visit: Payer: Self-pay | Admitting: *Deleted

## 2015-09-21 MED ORDER — POTASSIUM CHLORIDE CRYS ER 20 MEQ PO TBCR: 20.0000 meq | EXTENDED_RELEASE_TABLET | Freq: Every day | ORAL | Status: DC

## 2015-10-09 DIAGNOSIS — M792 Neuralgia and neuritis, unspecified: Secondary | ICD-10-CM | POA: Diagnosis not present

## 2015-10-12 ENCOUNTER — Telehealth: Payer: Self-pay | Admitting: Interventional Cardiology

## 2015-10-12 NOTE — Telephone Encounter (Signed)
New message      Calling to let the nurse know that his effient is supposed to be delivered to our office.  He will run out of medication on thurs.  The drug company told pt that someone here signed for the medication on 12-7.  Please call when you get the medication

## 2015-10-12 NOTE — Telephone Encounter (Signed)
Pt aware that we have received his Effient from the pt assistance program. Adv pt that I will leave th medication at the front desk for him to pick up. Pt voiced appreciation and verbalized understanding.

## 2015-10-15 ENCOUNTER — Other Ambulatory Visit: Payer: Self-pay

## 2015-10-15 DIAGNOSIS — G4733 Obstructive sleep apnea (adult) (pediatric): Secondary | ICD-10-CM

## 2015-10-18 ENCOUNTER — Ambulatory Visit (HOSPITAL_BASED_OUTPATIENT_CLINIC_OR_DEPARTMENT_OTHER): Payer: Medicare Other | Attending: Pulmonary Disease

## 2015-10-18 ENCOUNTER — Encounter (HOSPITAL_BASED_OUTPATIENT_CLINIC_OR_DEPARTMENT_OTHER): Payer: Federal, State, Local not specified - PPO

## 2015-10-18 VITALS — Ht 68.0 in | Wt 270.0 lb

## 2015-10-18 DIAGNOSIS — G4733 Obstructive sleep apnea (adult) (pediatric): Secondary | ICD-10-CM

## 2015-10-18 DIAGNOSIS — I493 Ventricular premature depolarization: Secondary | ICD-10-CM | POA: Insufficient documentation

## 2015-10-18 DIAGNOSIS — Z6841 Body Mass Index (BMI) 40.0 and over, adult: Secondary | ICD-10-CM | POA: Insufficient documentation

## 2015-10-18 DIAGNOSIS — E669 Obesity, unspecified: Secondary | ICD-10-CM | POA: Insufficient documentation

## 2015-10-18 DIAGNOSIS — R0683 Snoring: Secondary | ICD-10-CM | POA: Diagnosis not present

## 2015-10-19 ENCOUNTER — Encounter: Payer: Self-pay | Admitting: Internal Medicine

## 2015-10-19 ENCOUNTER — Ambulatory Visit (INDEPENDENT_AMBULATORY_CARE_PROVIDER_SITE_OTHER): Payer: Medicare Other | Admitting: Internal Medicine

## 2015-10-19 VITALS — BP 136/72 | HR 72 | Temp 97.9°F | Resp 20 | Wt 288.8 lb

## 2015-10-19 DIAGNOSIS — I200 Unstable angina: Secondary | ICD-10-CM | POA: Diagnosis not present

## 2015-10-19 DIAGNOSIS — E1159 Type 2 diabetes mellitus with other circulatory complications: Secondary | ICD-10-CM | POA: Diagnosis not present

## 2015-10-19 LAB — POCT GLYCOSYLATED HEMOGLOBIN (HGB A1C): Hemoglobin A1C: 8.9

## 2015-10-19 NOTE — Patient Instructions (Signed)
Please change the U500 insulin 30 min before a meal: - breakfast: 16-18 units  - lunch: 16-18 units  - dinner: 14-16 units   Please add the following sliding scale: 150-200: + 1 unit 201-250: + 2 units 251-300: + 3 units 301-350: + 4 units >350: + 5 units  If you start pulmonary rehab, subtract 4 units from the insulin with the previous meal.   Please stop at the lab.  Please return in 1.5 months with your sugar log.

## 2015-10-19 NOTE — Progress Notes (Signed)
Patient ID: Edward [name], male   DOB: [birth year], 74 y.o.   MRN: [id number]  HPI: Edward [name] is a 74 y.o.-year-old male, returning for f/u for DM2,  dx 1987, insulin-dependent since 1989, uncontrolled, with complications (CAD, PN). He moved from [place] in 12/2013. Last visit 2 mo ago.  Last hemoglobin A1c was:  Lab Results  Component Value Date   HGBA1C 10.5 08/20/2015   HGBA1C 10.5 05/20/2015   HGBA1C 8.9* 02/09/2015   We tried to switch to U500 insulin - could not get it as it was 400$ >> given him coupons: - 12-14 units before each meal Could not afford it.  He was on: Insulin  Before breakfast  Before lunch  Before dinner  Bedtime  Novolin 70/30  60 60 60 60 >> 70  Novolin 40 40 40   He was on Metformin >> N/V/D.  He is now on: - b'fast: 14-15 (20) units - lunch: 14-15 units - dinner: 14-20 units  Pt checks his sugars 4-5 a day: - am: 128-344 >> 52, 60, 79-339 >> 90-120s >> 150-240 >> 125, 156-200, 265 >> 110, 120-130, 234 - 2h after b'fast:200 >> 78 x1, 209-337 >> 128-263, 380 >> 170-180 >> 120-125 >> n/c - before lunch: 216, 294, 402 >> 179-370 >> 68, 178-299, 319  >> 130s >> 200 >> 125,135-169, 212 >> 170s - 2h after lunch: n/c >> 222, 375, 545 >> 198-309 >> 121-254 >> 170-180s >> n/c >> n/c - before dinner: 280s >> 177 >> 67 x1, 175-312 >> 302, 395 >> 170s >> 200 >> 123, 134-212 >> 150-170 - 2h after dinner: n/c >> 301-379>> 99, 128, 368 >> n/c >> 280 - bedtime: 200-300 >> 190-463 >> 46, 284 >> <200 >> 190-200 >> 93-198 >> n/c - nighttime: n/c >> 300s >> 139-339 >> n/c >> 52 x2 Lowest sugar was 93 - he has hypoglycemia awareness at 90  >> 50-70s 4-5x (at night). Highest sugar was 398 >> 200s >> 300s 3x (takes 5 units).  Has a One Touch Ultra meter.   Pt's meals are: - Breakfast: eggs, oatmeal, grits, bacon - Lunch: baked chiken + vegetables - Dinner: same as lunch - Snacks: sweets - cookies Rarely fast food. He gave up sodas. He still drink fruit juice.  He  will start Pulmonary rehab 2h 2x a week - for 8-10 weeks.  - mild CKD, last BUN/creatinine:  Lab Results  Component Value Date   BUN 14 07/24/2015   CREATININE 1.13 07/24/2015  On Lisinopril. ACR 37.6 in 02/11/2014) - Has HL. Last set of lipids: Lab Results  Component Value Date   CHOL 197 07/08/2015   HDL 40.50 07/08/2015   LDLCALC 100* 02/09/2015   LDLDIRECT 144.0 07/08/2015   TRIG 242.0* 07/08/2015   CHOLHDL 5 07/08/2015  Not on a statin. He gets leg cramps from Lipitor >> cannot tolerate it. - last eye exam was in 04/2015 (Dr Lanell Matar) 10/2014 (Dr Zigmund Daniel). Has DR. Had cataracts sx.  - + numbness and tingling in his feet. He saw podiatrist (05/2015) >> has PN. On ASA 81.  I reviewed pt's medications, allergies, PMH, social hx, family hx, and changes were documented in the history of present illness. Otherwise, unchanged from my initial visit note, except he started Effient.  He had a sleep study last night.  He is going through a divorce.  ROS: Constitutional: + weight gain, + fatigue, no hot flushes, + poor sleep Eyes: + blurry vision, no xerophthalmia ENT:  no sore throat, no nodules palpated in throat, no dysphagia/odynophagia, no hoarseness Cardiovascular: no CP/+ SOB/no palpitations/+ leg swelling Respiratory: + cough/+ SOB/+ wheezing Gastrointestinal: no N/V/D/+ C/no heartburn Musculoskeletal: + muscle aches/no joint aches  Skin: no rash Neurological: + tremors/no numbness/tingling/dizziness  PE: BP 136/72 mmHg  Pulse 72  Temp(Src) 97.9 F (36.6 C) (Oral)  Resp 20  Wt 288 lb 12.8 oz (130.999 kg)  SpO2 96% Body mass index is 43.92 kg/(m^2). Wt Readings from Last 3 Encounters:  10/19/15 288 lb 12.8 oz (130.999 kg)  10/18/15 270 lb (122.471 kg)  08/27/15 274 lb 3.2 oz (124.376 kg)   Constitutional: obese, in NAD Eyes: PERRLA, EOMI, no exophthalmos ENT: moist mucous membranes, no thyromegaly, no cervical lymphadenopathy Cardiovascular: RRR, No MRG, + B  pitting leg swelling Respiratory: CTA B Gastrointestinal: abdomen soft, NT, ND, BS+ Musculoskeletal: no deformities, strength intact in all 4 Skin: moist, warm, no rashes Neurological: no tremor with outstretched hands, DTR normal in all 4  ASSESSMENT: 1. DM2, insulin-dependent, uncontrolled, with complications - CAD H/o steroid inj's  Cardiologist: Dr Tamala Julian. Pulmonologist: Dr Lake Bells  PLAN:  1. Patient with long-standing, uncontrolled diabetes, now finally on U500 insulin. This worked great in the past, with significant decrease in HbA1c and feeling much better, but he could not get it because this was too expensive. He now gets it through the patient the system program at [employer].  - sugars are close to target in am, but they are above target before lunch and before dinner. We will therefore increase the insulin with breakfast and lunch. I would also add sliding scale. He does not check sugars at bedtime usually, and I advised him to start doing so, to better evaluate the dinnertime insulin dose. He does have some low sugars at night, therefore I advised him to avoid taking more than 16 units with dinner. We also discussed about the need to reduce the doses if he starts pulmonary rehabilitation. - I suggested to:  Patient Instructions  Please change the U500 insulin 30 min before a meal: - breakfast: 16-18 units  - lunch: 16-18 units  - dinner: 14-16 units   Please add the following sliding scale: 150-200: + 1 unit 201-250: + 2 units 251-300: + 3 units 301-350: + 4 units >350: + 5 units  If you start pulmonary rehab, subtract 4 units from the insulin with the previous meal.   Please stop at the lab.  Please return in 1.5 months with your sugar log.   - continue checking sugars at different times of the day - check 3-4 times a day, rotating checks - up to date with yearly eye exams  - checked HbA1c upon his request as he wants to see how his new regimen has improved his DM2  control >> 8.9% (already decreased!) - We'll also check a fructosamine per his request - Return to clinic in 1.5 mo with sugar log   Needs PA for strips.

## 2015-10-21 ENCOUNTER — Encounter: Payer: Self-pay | Admitting: Family Medicine

## 2015-10-30 DIAGNOSIS — B353 Tinea pedis: Secondary | ICD-10-CM | POA: Diagnosis not present

## 2015-10-30 DIAGNOSIS — M792 Neuralgia and neuritis, unspecified: Secondary | ICD-10-CM | POA: Diagnosis not present

## 2015-10-31 ENCOUNTER — Inpatient Hospital Stay (HOSPITAL_COMMUNITY)
Admission: EM | Admit: 2015-10-31 | Discharge: 2015-11-01 | DRG: 313 | Disposition: A | Discharge status: Home / Self Care | Payer: Medicare Other | Attending: Internal Medicine | Admitting: Internal Medicine

## 2015-10-31 ENCOUNTER — Encounter (HOSPITAL_COMMUNITY): Payer: Self-pay

## 2015-10-31 ENCOUNTER — Emergency Department (HOSPITAL_COMMUNITY): Payer: Medicare Other

## 2015-10-31 DIAGNOSIS — E1165 Type 2 diabetes mellitus with hyperglycemia: Secondary | ICD-10-CM | POA: Diagnosis present

## 2015-10-31 DIAGNOSIS — G4733 Obstructive sleep apnea (adult) (pediatric): Secondary | ICD-10-CM | POA: Diagnosis present

## 2015-10-31 DIAGNOSIS — J849 Interstitial pulmonary disease, unspecified: Secondary | ICD-10-CM

## 2015-10-31 DIAGNOSIS — I2510 Atherosclerotic heart disease of native coronary artery without angina pectoris: Secondary | ICD-10-CM | POA: Diagnosis not present

## 2015-10-31 DIAGNOSIS — Z79891 Long term (current) use of opiate analgesic: Secondary | ICD-10-CM

## 2015-10-31 DIAGNOSIS — Z955 Presence of coronary angioplasty implant and graft: Secondary | ICD-10-CM

## 2015-10-31 DIAGNOSIS — J209 Acute bronchitis, unspecified: Secondary | ICD-10-CM | POA: Diagnosis present

## 2015-10-31 DIAGNOSIS — I10 Essential (primary) hypertension: Secondary | ICD-10-CM | POA: Diagnosis not present

## 2015-10-31 DIAGNOSIS — Z96653 Presence of artificial knee joint, bilateral: Secondary | ICD-10-CM | POA: Diagnosis present

## 2015-10-31 DIAGNOSIS — E1159 Type 2 diabetes mellitus with other circulatory complications: Secondary | ICD-10-CM

## 2015-10-31 DIAGNOSIS — I209 Angina pectoris, unspecified: Secondary | ICD-10-CM

## 2015-10-31 DIAGNOSIS — Z8673 Personal history of transient ischemic attack (TIA), and cerebral infarction without residual deficits: Secondary | ICD-10-CM

## 2015-10-31 DIAGNOSIS — I5033 Acute on chronic diastolic (congestive) heart failure: Secondary | ICD-10-CM

## 2015-10-31 DIAGNOSIS — K219 Gastro-esophageal reflux disease without esophagitis: Secondary | ICD-10-CM | POA: Diagnosis present

## 2015-10-31 DIAGNOSIS — R079 Chest pain, unspecified: Principal | ICD-10-CM

## 2015-10-31 DIAGNOSIS — N400 Enlarged prostate without lower urinary tract symptoms: Secondary | ICD-10-CM | POA: Diagnosis present

## 2015-10-31 DIAGNOSIS — Z7982 Long term (current) use of aspirin: Secondary | ICD-10-CM

## 2015-10-31 DIAGNOSIS — R0602 Shortness of breath: Secondary | ICD-10-CM | POA: Diagnosis not present

## 2015-10-31 DIAGNOSIS — Z79899 Other long term (current) drug therapy: Secondary | ICD-10-CM | POA: Diagnosis not present

## 2015-10-31 DIAGNOSIS — E785 Hyperlipidemia, unspecified: Secondary | ICD-10-CM | POA: Diagnosis present

## 2015-10-31 DIAGNOSIS — Z794 Long term (current) use of insulin: Secondary | ICD-10-CM | POA: Diagnosis not present

## 2015-10-31 DIAGNOSIS — Z9861 Coronary angioplasty status: Secondary | ICD-10-CM

## 2015-10-31 LAB — CBG MONITORING, ED
GLUCOSE-CAPILLARY: 84 mg/dL (ref 65–99)
GLUCOSE-CAPILLARY: 111 mg/dL — AB (ref 65–99)
GLUCOSE-CAPILLARY: 116 mg/dL — AB (ref 65–99)
Glucose-Capillary: 78 mg/dL (ref 65–99)

## 2015-10-31 LAB — I-STAT TROPONIN, ED: TROPONIN I, POC: 0.01 ng/mL (ref 0.00–0.08)

## 2015-10-31 LAB — BASIC METABOLIC PANEL
Anion gap: 9 (ref 5–15)
BUN: 11 mg/dL (ref 6–20)
CO2: 30 mmol/L (ref 22–32)
CREATININE: 1.22 mg/dL (ref 0.61–1.24)
Calcium: 9.3 mg/dL (ref 8.9–10.3)
Chloride: 107 mmol/L (ref 101–111)
GFR, EST NON AFRICAN AMERICAN: 57 mL/min — AB (ref >60)
Glucose, Bld: 108 mg/dL — ABNORMAL HIGH (ref 65–99)
POTASSIUM: 3.6 mmol/L (ref 3.5–5.1)
SODIUM: 146 mmol/L — AB (ref 135–145)

## 2015-10-31 LAB — GLUCOSE, CAPILLARY
GLUCOSE-CAPILLARY: 154 mg/dL — AB (ref 65–99)
Glucose-Capillary: 315 mg/dL — ABNORMAL HIGH (ref 65–99)
Glucose-Capillary: 365 mg/dL — ABNORMAL HIGH (ref 65–99)
Glucose-Capillary: 295 mg/dL — ABNORMAL HIGH (ref 65–99)

## 2015-10-31 LAB — BRAIN NATRIURETIC PEPTIDE: B NATRIURETIC PEPTIDE 5: 31.1 pg/mL (ref 0.0–100.0)

## 2015-10-31 LAB — CBC
HEMATOCRIT: 37.2 % — AB (ref 39.0–52.0)
HEMOGLOBIN: 12 g/dL — AB (ref 13.0–17.0)
MCH: 28.4 pg (ref 26.0–34.0)
MCHC: 32.3 g/dL (ref 30.0–36.0)
MCV: 87.9 fL (ref 78.0–100.0)
Platelets: 238 10*3/uL (ref 150–400)
RBC: 4.23 MIL/uL (ref 4.22–5.81)
RDW: 13.8 % (ref 11.5–15.5)
WBC: 5.9 10*3/uL (ref 4.0–10.5)

## 2015-10-31 LAB — MRSA PCR SCREENING: MRSA by PCR: NEGATIVE

## 2015-10-31 LAB — TROPONIN I
TROPONIN I: 0.03 ng/mL (ref <0.031)
Troponin I: <0.03 ng/mL (ref <0.031)

## 2015-10-31 LAB — HEPARIN LEVEL (UNFRACTIONATED)
Heparin Unfractionated: 0.5 IU/mL (ref 0.30–0.70)
Heparin Unfractionated: 0.47 IU/mL (ref 0.30–0.70)

## 2015-10-31 MED ORDER — INSULIN ASPART 100 UNIT/ML ~~LOC~~ SOLN
0.0000 [IU] | SUBCUTANEOUS | Status: DC
  Administered 2015-10-31: 20 [IU] via SUBCUTANEOUS
  Administered 2015-10-31: 4 [IU] via SUBCUTANEOUS
  Administered 2015-10-31: 11 [IU] via SUBCUTANEOUS
  Administered 2015-11-01: 7 [IU] via SUBCUTANEOUS
  Administered 2015-11-01: 11 [IU] via SUBCUTANEOUS
  Administered 2015-11-01: 15 [IU] via SUBCUTANEOUS
  Administered 2015-11-01: 7 [IU] via SUBCUTANEOUS

## 2015-10-31 MED ORDER — REGADENOSON 0.4 MG/5ML IV SOLN: 0.4000 mg | Freq: Once | INTRAVENOUS | Status: DC

## 2015-10-31 MED ORDER — PANTOPRAZOLE SODIUM 40 MG PO TBEC
80.0000 mg | DELAYED_RELEASE_TABLET | Freq: Every day | ORAL | Status: DC
Start: 2015-10-31 — End: 2015-11-01
  Administered 2015-10-31 – 2015-11-01 (×2): 80 mg via ORAL
  Filled 2015-10-31 (×2): qty 2

## 2015-10-31 MED ORDER — METOPROLOL TARTRATE 50 MG PO TABS
50.0000 mg | ORAL_TABLET | Freq: Two times a day (BID) | ORAL | Status: DC
  Administered 2015-10-31 – 2015-11-01 (×3): 50 mg via ORAL
  Filled 2015-10-31 (×3): qty 1

## 2015-10-31 MED ORDER — DICYCLOMINE HCL 10 MG PO CAPS
10.0000 mg | ORAL_CAPSULE | Freq: Three times a day (TID) | ORAL | Status: DC
  Administered 2015-10-31 – 2015-11-01 (×5): 10 mg via ORAL
  Filled 2015-10-31 (×5): qty 1

## 2015-10-31 MED ORDER — FUROSEMIDE 20 MG PO TABS: 40.0000 mg | ORAL_TABLET | Freq: Every day | ORAL | Status: DC

## 2015-10-31 MED ORDER — PROPRANOLOL HCL 40 MG PO TABS
40.0000 mg | ORAL_TABLET | Freq: Two times a day (BID) | ORAL | Status: DC
  Administered 2015-10-31 – 2015-11-01 (×3): 40 mg via ORAL
  Filled 2015-10-31 (×4): qty 1

## 2015-10-31 MED ORDER — HYDROCHLOROTHIAZIDE 25 MG PO TABS
25.0000 mg | ORAL_TABLET | Freq: Every day | ORAL | Status: DC
  Administered 2015-10-31 – 2015-11-01 (×2): 25 mg via ORAL
  Filled 2015-10-31 (×2): qty 1

## 2015-10-31 MED ORDER — HEPARIN (PORCINE) IN NACL 100-0.45 UNIT/ML-% IJ SOLN
1500.0000 [IU]/h | INTRAMUSCULAR | Status: DC
  Administered 2015-10-31 (×2): 1500 [IU]/h via INTRAVENOUS
  Filled 2015-10-31 (×2): qty 250

## 2015-10-31 MED ORDER — MORPHINE SULFATE (PF) 2 MG/ML IV SOLN
2.0000 mg | INTRAVENOUS | Status: DC | PRN
  Administered 2015-11-01: 2 mg via INTRAVENOUS
  Filled 2015-10-31: qty 1

## 2015-10-31 MED ORDER — HEPARIN BOLUS VIA INFUSION
4000.0000 [IU] | Freq: Once | INTRAVENOUS | Status: AC
  Administered 2015-10-31: 4000 [IU] via INTRAVENOUS
  Filled 2015-10-31: qty 4000

## 2015-10-31 MED ORDER — HYDROCHLOROTHIAZIDE 25 MG PO TABS: 25.0000 mg | ORAL_TABLET | Freq: Every day | ORAL | Status: DC

## 2015-10-31 MED ORDER — MORPHINE SULFATE (PF) 4 MG/ML IV SOLN
4.0000 mg | Freq: Once | INTRAVENOUS | Status: AC
  Administered 2015-10-31: 4 mg via INTRAVENOUS
  Filled 2015-10-31: qty 1

## 2015-10-31 MED ORDER — AMLODIPINE BESYLATE 10 MG PO TABS
10.0000 mg | ORAL_TABLET | Freq: Every day | ORAL | Status: DC
  Administered 2015-10-31 – 2015-11-01 (×2): 10 mg via ORAL
  Filled 2015-10-31 (×2): qty 1

## 2015-10-31 MED ORDER — ONDANSETRON HCL 4 MG/2ML IJ SOLN: 4.0000 mg | Freq: Four times a day (QID) | INTRAMUSCULAR | Status: DC | PRN

## 2015-10-31 MED ORDER — MOMETASONE FURO-FORMOTEROL FUM 100-5 MCG/ACT IN AERO
2.0000 | INHALATION_SPRAY | Freq: Two times a day (BID) | RESPIRATORY_TRACT | Status: DC
  Administered 2015-11-01: 2 via RESPIRATORY_TRACT
  Filled 2015-10-31: qty 8.8

## 2015-10-31 MED ORDER — NITROGLYCERIN 0.4 MG SL SUBL
0.4000 mg | SUBLINGUAL_TABLET | SUBLINGUAL | Status: DC | PRN
  Administered 2015-11-01: 0.4 mg via SUBLINGUAL
  Filled 2015-10-31: qty 1

## 2015-10-31 MED ORDER — ISOSORBIDE MONONITRATE ER 30 MG PO TB24
30.0000 mg | ORAL_TABLET | Freq: Every day | ORAL | Status: DC
  Administered 2015-10-31 – 2015-11-01 (×2): 30 mg via ORAL
  Filled 2015-10-31 (×2): qty 1

## 2015-10-31 MED ORDER — FUROSEMIDE 10 MG/ML IJ SOLN
40.0000 mg | Freq: Every day | INTRAMUSCULAR | Status: DC
  Administered 2015-10-31 – 2015-11-01 (×2): 40 mg via INTRAVENOUS
  Filled 2015-10-31 (×2): qty 4

## 2015-10-31 MED ORDER — PRAVASTATIN SODIUM 40 MG PO TABS
40.0000 mg | ORAL_TABLET | Freq: Every day | ORAL | Status: DC
  Administered 2015-10-31 – 2015-11-01 (×2): 40 mg via ORAL
  Filled 2015-10-31 (×2): qty 1

## 2015-10-31 MED ORDER — CLONAZEPAM 0.5 MG PO TABS
1.0000 mg | ORAL_TABLET | Freq: Every evening | ORAL | Status: DC | PRN
  Administered 2015-10-31: 1 mg via ORAL
  Filled 2015-10-31: qty 2

## 2015-10-31 MED ORDER — POTASSIUM CHLORIDE CRYS ER 20 MEQ PO TBCR
20.0000 meq | EXTENDED_RELEASE_TABLET | Freq: Every day | ORAL | Status: DC
  Administered 2015-10-31 – 2015-11-01 (×2): 20 meq via ORAL
  Filled 2015-10-31 (×2): qty 1

## 2015-10-31 MED ORDER — ACETAMINOPHEN 325 MG PO TABS: 650.0000 mg | ORAL_TABLET | ORAL | Status: DC | PRN

## 2015-10-31 MED ORDER — ALBUTEROL SULFATE HFA 108 (90 BASE) MCG/ACT IN AERS: 2.0000 | INHALATION_SPRAY | Freq: Four times a day (QID) | RESPIRATORY_TRACT | Status: DC | PRN

## 2015-10-31 MED ORDER — ASPIRIN EC 81 MG PO TBEC
81.0000 mg | DELAYED_RELEASE_TABLET | Freq: Every day | ORAL | Status: DC
  Administered 2015-10-31 – 2015-11-01 (×2): 81 mg via ORAL
  Filled 2015-10-31 (×2): qty 1

## 2015-10-31 MED ORDER — ALBUTEROL SULFATE (2.5 MG/3ML) 0.083% IN NEBU: 2.5000 mg | INHALATION_SOLUTION | Freq: Four times a day (QID) | RESPIRATORY_TRACT | Status: DC | PRN

## 2015-10-31 MED ORDER — DEXTROSE 50 % IV SOLN
1.0000 | Freq: Once | INTRAVENOUS | Status: DC
  Filled 2015-10-31: qty 50

## 2015-10-31 MED ORDER — PRASUGREL HCL 10 MG PO TABS
10.0000 mg | ORAL_TABLET | Freq: Every day | ORAL | Status: DC
  Administered 2015-10-31 – 2015-11-01 (×2): 10 mg via ORAL
  Filled 2015-10-31 (×2): qty 1

## 2015-10-31 MED ORDER — OXYCODONE-ACETAMINOPHEN 5-325 MG PO TABS
1.0000 | ORAL_TABLET | Freq: Two times a day (BID) | ORAL | Status: DC | PRN
  Administered 2015-10-31: 1 via ORAL
  Filled 2015-10-31: qty 1

## 2015-10-31 MED ORDER — NITROGLYCERIN 2 % TD OINT
0.5000 [in_us] | TOPICAL_OINTMENT | Freq: Four times a day (QID) | TRANSDERMAL | Status: DC
  Administered 2015-10-31: 0.5 [in_us] via TOPICAL
  Filled 2015-10-31: qty 1
  Filled 2015-10-31: qty 30

## 2015-10-31 MED ORDER — FENOFIBRATE 160 MG PO TABS
160.0000 mg | ORAL_TABLET | Freq: Every day | ORAL | Status: DC
  Administered 2015-10-31 – 2015-11-01 (×2): 160 mg via ORAL
  Filled 2015-10-31 (×2): qty 1

## 2015-10-31 NOTE — ED Notes (Signed)
CBG 78. 

## 2015-10-31 NOTE — ED Notes (Signed)
CBG 111, amp of d50 held

## 2015-10-31 NOTE — H&P (Addendum)
[facility] History and Physical  Edward [name] MMI:[id number] DOB: [birth year] DOA: 10/31/2015  Referring physician: EDP PCP: Garnet Koyanagi, DO   Chief Complaint: Chest pain   HPI: Edward [name] is a 74 y.o. male with h/o poorly controlled DM2, currently on U500 for treatment, CAD with stent in April of 2016 after his LAD disease progressed somewhat significantly from just a year prior.  Last stress test was in August of 2016 and was low risk.  Patient presents to ED with c/o chest pain that woke him from sleep this morning.  He has been having intermittent and worsening chest pain over the past 2 weeks.  Symptoms include DOE, his symptoms are worse with exertion.  He has been taking NTG which has been helping somewhat (until this morning in the ambulance when it didn't).  He fell out of bed when he was trying to get up and get his NTG and so pressed his life alert.  Review of Systems: Systems reviewed.  As above, otherwise negative  Past Medical History  Diagnosis Date  . Hypertension   . CAD (coronary artery disease)     a. Pt reports "small heart attack" in 2013 at [facility], denies need for stent or CABG, does not know findings of cath.  Marland Kitchen TIA (transient ischemic attack) 2002  . Dyslipidemia, goal LDL below 70 01/29/2014  . GERD (gastroesophageal reflux disease)   . Enlarged prostate   . Shortness of breath     exertion  . DM type 2 (diabetes mellitus, type 2), insulin dependent 01/29/2014    fasting cbg 50-120 with new regimen  . Anxiety   . Heart attack ([place]) 02/03/2014    "mild heart attack"  . Pneumonia 01/2013  . Pneumonia 03/2014    hospitalized  . Sleep apnea     does not use cpap (06/04/2015)  . HSJWTGRM(301.4)     "monthly" (06/04/2015)  . Arthritis     'all over"  . Tremor of both hands   . Adenomatous colon polyp 04/2011  . Diverticulosis   . Internal hemorrhoid    Past Surgical History  Procedure Laterality Date  . Total knee arthroplasty  Bilateral   . Lumbar disc surgery    . Cataract extraction Bilateral   . Joint replacement    . Back surgery    . Cataract extraction, bilateral Bilateral   . Shoulder arthroscopy Right 07/30/2014    Procedure: Right Shoulder Arthroscopy, Debridement, Decompression, Manipulation Under Anesthesia;  Surgeon: Newt Minion, MD;  Location: [place];  Service: Orthopedics;  Laterality: Right;  . Left heart catheterization with coronary angiogram N/A 01/28/2014    Procedure: LEFT HEART CATHETERIZATION WITH CORONARY ANGIOGRAM;  Surgeon: Sinclair Grooms, MD;  Location: [facility] CATH LAB;  Service: Cardiovascular;  Laterality: N/A;  . Left heart catheterization with coronary angiogram N/A 01/30/2015    Procedure: LEFT HEART CATHETERIZATION WITH CORONARY ANGIOGRAM;  Surgeon: Belva Crome, MD;  Location: [facility] CATH LAB;  Service: Cardiovascular;  Laterality: N/A;  . Cardiac catheterization  01/28/14    + CAD treat medically  . Coronary angioplasty with stent placement  01/30/2015    DES Promus  Premier to LAD by Dr Tamala Julian   Social History:  reports that he has never smoked. He has never used smokeless tobacco. He reports that he does not drink alcohol or use illicit drugs.  No Known Allergies  Family History  Problem Relation Age of Onset  . CAD Brother  2 brothers - CABG  . Alzheimer's disease Sister   . CAD Sister   . Prostate cancer Brother   . Diabetes Other     entire family  . Asthma Brother     2 brothers      Prior to Admission medications   Medication Sig Start Date End Date Taking? Authorizing Provider  amLODipine (NORVASC) 10 MG tablet Take 10 mg by mouth daily. 08/06/15  Yes Historical Provider, MD  aspirin EC 81 MG tablet Take 81 mg by mouth daily.   Yes Historical Provider, MD  Ciclopirox 1 % shampoo Apply 1 application topically at bedtime. At night after showering 11/20/14  Yes Historical Provider, MD  clonazePAM (KLONOPIN) 1 MG tablet Take 1 tablet (1 mg total) by mouth at bedtime as  needed for anxiety. 08/31/15  Yes Grayling Congress Lowne, DO  dicyclomine (BENTYL) 10 MG capsule Take 1 capsule (10 mg total) by mouth 3 (three) times daily before meals. 07/30/15  Yes Meryl Dare, MD  esomeprazole (NEXIUM) 40 MG capsule Take 40 mg by mouth daily at 12 noon.   Yes Historical Provider, MD  fenofibrate 160 MG tablet Take 160 mg by mouth daily. 07/17/15  Yes Historical Provider, MD  furosemide (LASIX) 40 MG tablet Take 1 tablet (40 mg total) by mouth daily. 07/17/15  Yes Lyn Records, MD  hydrochlorothiazide (HYDRODIURIL) 12.5 MG tablet Take 2 tablets (25 mg total) by mouth daily. 06/05/15  Yes Dwana Melena, PA-C  insulin regular human CONCENTRATED (HUMULIN R) 500 UNIT/ML injection Inject 0.14-0.18 ml (draw to 14-18 units on the insulin syringe) (70-90 units of insulin) 3x a day, 30 min before meals 08/25/15  Yes Carlus Pavlov, MD  metoprolol (LOPRESSOR) 50 MG tablet Take 1 tablet (50 mg total) by mouth 2 (two) times daily. 04/06/15  Yes Dyann Kief, PA-C  nitroGLYCERIN (NITROSTAT) 0.4 MG SL tablet Place 1 tablet (0.4 mg total) under the tongue every 5 (five) minutes as needed for chest pain (CP or SOB). 01/31/15  Yes Abelino Derrick, PA-C  oxyCODONE-acetaminophen (PERCOCET/ROXICET) 5-325 MG tablet Take 1 tablet by mouth every 12 (twelve) hours as needed. Reported on 10/19/2015 07/29/15  Yes Historical Provider, MD  Polyvinyl Alcohol-Povidone (REFRESH OP) Place 1 drop into both eyes daily as needed (for dry eyes).   Yes Historical Provider, MD  potassium chloride SA (K-DUR,KLOR-CON) 20 MEQ tablet Take 1 tablet (20 mEq total) by mouth daily. 09/21/15  Yes Lyn Records, MD  prasugrel (EFFIENT) 10 MG TABS tablet Take 1 tablet (10 mg total) by mouth daily. 08/25/15  Yes Lyn Records, MD  pravastatin (PRAVACHOL) 40 MG tablet Take 1 tablet (40 mg total) by mouth daily. 02/12/15  Yes Yvonne R Lowne, DO  propranolol (INDERAL) 40 MG tablet Take 1 tablet (40 mg total) by mouth 2 (two) times daily.  07/29/15  Yes Donika K Patel, DO  VENTOLIN HFA 108 (90 BASE) MCG/ACT inhaler Inhale 2 puffs into the lungs every 6 (six) hours as needed. (SHORTNESS OR BREATH / WHEEZING) 06/22/15  Yes Historical Provider, MD  Blood Glucose Monitoring Suppl (ONE TOUCH ULTRA SYSTEM KIT) W/DEVICE KIT Use to test blood sugar daily as instructed. Dx code: E11.59 12/30/14   Carlus Pavlov, MD  Insulin Syringes, Disposable, U-100 1 ML MISC Use 4x a day 08/20/15   Carlus Pavlov, MD   Physical Exam: Filed Vitals:   10/31/15 0454 10/31/15 0500  BP: 142/68 146/63  Pulse: 64 63  Temp: 98.3 F (36.8  C)   Resp: 15 16    BP 146/63 mmHg  Pulse 63  Temp(Src) 98.3 F (36.8 C) (Oral)  Resp 16  Ht '5\' 8"'$  (1.727 m)  Wt 126.1 kg (278 lb)  BMI 42.28 kg/m2  SpO2 98%  General Appearance:    Alert, oriented, no distress, appears stated age  Head:    Normocephalic, atraumatic  Eyes:    PERRL, EOMI, sclera non-icteric        Nose:   Nares without drainage or epistaxis. Mucosa, turbinates normal  Throat:   Moist mucous membranes. Oropharynx without erythema or exudate.  Neck:   Supple. No carotid bruits.  No thyromegaly.  No lymphadenopathy.   Back:     No CVA tenderness, no spinal tenderness  Lungs:     Clear to auscultation bilaterally, without wheezes, rhonchi or rales  Chest wall:    No tenderness to palpitation  Heart:    Regular rate and rhythm without murmurs, gallops, rubs  Abdomen:     Soft, non-tender, nondistended, normal bowel sounds, no organomegaly  Genitalia:    deferred  Rectal:    deferred  Extremities:   No clubbing, cyanosis or edema.  Pulses:   2+ and symmetric all extremities  Skin:   Skin color, texture, turgor normal, no rashes or lesions  Lymph nodes:   Cervical, supraclavicular, and axillary nodes normal  Neurologic:   CNII-XII intact. Normal strength, sensation and reflexes      throughout    Labs on Admission:  Basic Metabolic Panel:  Recent Labs Lab 10/31/15 0510  NA 146*  K 3.6   CL 107  CO2 30  GLUCOSE 108*  BUN 11  CREATININE 1.22  CALCIUM 9.3   Liver Function Tests: No results for input(s): AST, ALT, ALKPHOS, BILITOT, PROT, ALBUMIN in the last 168 hours. No results for input(s): LIPASE, AMYLASE in the last 168 hours. No results for input(s): AMMONIA in the last 168 hours. CBC:  Recent Labs Lab 10/31/15 0510  WBC 5.9  HGB 12.0*  HCT 37.2*  MCV 87.9  PLT 238   Cardiac Enzymes: No results for input(s): CKTOTAL, CKMB, CKMBINDEX, TROPONINI in the last 168 hours.  BNP (last 3 results)  Recent Labs  07/17/15 1139 07/24/15 0841  PROBNP 35.0 24.0   CBG:  Recent Labs Lab 10/31/15 0501  GLUCAP 84    Radiological Exams on Admission: Dg Chest 2 View  10/31/2015  CLINICAL DATA:  Chest pain and shortness of breath today. History CHF, hypertension, pneumonia. EXAM: CHEST  2 VIEW COMPARISON:  Chest radiograph June 04, 2015 FINDINGS: The cardiac silhouette appears mildly enlarged, accentuated by low inspiratory technique. Pulmonary vascular congestion. Mildly elevated RIGHT hemidiaphragm without pleural effusion or focal consolidation. No pneumothorax. Soft tissue planes and included osseous structure nonsuspicious. Mild degenerative change of the thoracic spine. Severe degenerative change of the included LEFT shoulder. IMPRESSION: Mild cardiomegaly and pulmonary vascular congestion in this low inspiratory examination. Electronically Signed   By: Elon Alas M.D.   On: 10/31/2015 05:26    EKG: Independently reviewed.  Old ischemic changes, similar to prior.  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 Chest pain high risk for cardiac etiology - HEART score is  7 1. Clinical presentation is  concerning for progressive angina over past couple of weeks in a patient with known CAD, very bad DM, and history of progressive CAD in past. 2. Chest pain obs pathway 3. Needs cards eval in AM, he is one I am less inclined to feel safe with despite the low risk stress test in August. 4. Serial trops 5. NPO 6. Heparin gtt 7. Nitro paste 8. Morphine PRN 9. ASA given en route with EMS, patient Is also on Effient 2. HTN - continue home meds 3. DM2 - 1. Holding his mealtime U500 2. He takes no basal insulin apparently 3. Will put him on resistant scale SSI Q4H    Code Status: Full  Family Communication: No family in room Disposition Plan: Admit to inpatient   Time spent: 70 min  GARDNER, JARED M. [facility] Pager [phone]  If 7AM-7PM, please contact the day team taking care of the patient [email or web address] Password [email or web address] 10/31/2015, 6:10 AM

## 2015-10-31 NOTE — ED Notes (Signed)
This RN spoke with Bed Control and advised to hold the patient until floor RN able to take report.  Charge RN made aware.

## 2015-10-31 NOTE — ED Notes (Signed)
Attempted report to floor RN.  Charge RN made aware why is still in the ED at this time.

## 2015-10-31 NOTE — Progress Notes (Signed)
ANTICOAGULATION CONSULT NOTE - Follow Up Consult  Pharmacy Consult for heparin Indication: chest pain/ACS  No Known Allergies  Patient Measurements: Height: 5\' 8"  (172.7 cm) Weight: 278 lb (126.1 kg) IBW/kg (Calculated) : 68.4 Heparin Dosing Weight: 97 kg  Vital Signs: Temp: 97.7 F (36.5 C) (12/31 2030) Temp Source: Oral (12/31 2030) BP: 124/57 mmHg (12/31 2030) Pulse Rate: 68 (12/31 2030)  Labs:  Recent Labs  10/31/15 0510 10/31/15 0738 10/31/15 0951 10/31/15 1426 10/31/15 2006  HGB 12.0*  --   --   --   --   HCT 37.2*  --   --   --   --   PLT 238  --   --   --   --   HEPARINUNFRC  --   --   --  0.50 0.47  CREATININE 1.22  --   --   --   --   TROPONINI  --  <0.03 0.03 <0.03  --     Estimated Creatinine Clearance: 69.8 mL/min (by C-G formula based on Cr of 1.22).   Assessment: 74 y.o. M presents with CP. To begin heparin for r/o ACS. CBC ok on admission. No AC PTA. HL remains therapeutic at 0.47. No s/s of bleed.  Goal of Therapy:  Heparin level 0.3-0.7 units/ml Monitor platelets by anticoagulation protocol: Yes   Plan:  Continue heparin gtt at 1,500 units/hr Monitor daily HL, CBC, s/s of bleed  Elenor Quinones, PharmD, [facility] Clinical Pharmacist Pager [phone] 10/31/2015 8:52 PM

## 2015-10-31 NOTE — ED Notes (Signed)
Per EMS, Pt pressed med alert 1 hour ago because he was woken up by sharp chest pain radiating to the left arm with sob. Pt was attempting to get up to take a nitro and slid out of the bed onto the floor. That's when he pressed his med alert. Upon ems arrival pt was still complaining of cp and sob. Chest pain reproducable with palpation. Lung sounds clear. Pt also complaining of intermitten episodes of diaphoresis x 2 weeks and non productive cough x 2 weeks. Has hx of chf. Hypertensive. Rest of vs stable. Pt has hx of MI in May of this year with stent placement.

## 2015-10-31 NOTE — ED Provider Notes (Addendum)
CSN: [id number]     Arrival date & time 10/31/15  0445 History   First MD Initiated Contact with Patient 10/31/15 0450     Chief Complaint  Patient presents with  . Chest Pain     (Consider location/radiation/quality/duration/timing/severity/associated sxs/prior Treatment) HPI Comments: 74 year old male with history of high blood pressure, obesity, CAD, stent placement April 2016, pneumonia, diabetes presents with intermittent left-sided chest pain rating to left arm with shortness of breath. Patient slid down to the floor when he was trying to get up to take his nitroglycerin. No head injury. No loss of consciousness. No recent surgeries or blood clot history. Patient had intermittent diaphoresis for the past week with exertion. Nonproductive cough. Symptoms mild currently. Dr. Daneen Schick cardiologist.  Patient is a 74 y.o. male presenting with chest pain. The history is provided by the patient.  Chest Pain Associated symptoms: cough, diaphoresis and shortness of breath   Associated symptoms: no abdominal pain, no back pain, no fever, no headache and not vomiting     Past Medical History  Diagnosis Date  . Hypertension   . CAD (coronary artery disease)     a. Pt reports "small heart attack" in 2013 at [facility], denies need for stent or CABG, does not know findings of cath.  Marland Kitchen TIA (transient ischemic attack) 2002  . Dyslipidemia, goal LDL below 70 01/29/2014  . GERD (gastroesophageal reflux disease)   . Enlarged prostate   . Shortness of breath     exertion  . DM type 2 (diabetes mellitus, type 2), insulin dependent 01/29/2014    fasting cbg 50-120 with new regimen  . Anxiety   . Heart attack ([place]) 02/03/2014    "mild heart attack"  . Pneumonia 01/2013  . Pneumonia 03/2014    hospitalized  . Sleep apnea     does not use cpap (06/04/2015)  . BWIOMBTD(974.1)     "monthly" (06/04/2015)  . Arthritis     'all over"  . Tremor of both hands   . Adenomatous colon polyp  04/2011  . Diverticulosis   . Internal hemorrhoid    Past Surgical History  Procedure Laterality Date  . Total knee arthroplasty Bilateral   . Lumbar disc surgery    . Cataract extraction Bilateral   . Joint replacement    . Back surgery    . Cataract extraction, bilateral Bilateral   . Shoulder arthroscopy Right 07/30/2014    Procedure: Right Shoulder Arthroscopy, Debridement, Decompression, Manipulation Under Anesthesia;  Surgeon: Newt Minion, MD;  Location: [place];  Service: Orthopedics;  Laterality: Right;  . Left heart catheterization with coronary angiogram N/A 01/28/2014    Procedure: LEFT HEART CATHETERIZATION WITH CORONARY ANGIOGRAM;  Surgeon: Sinclair Grooms, MD;  Location: [facility] CATH LAB;  Service: Cardiovascular;  Laterality: N/A;  . Left heart catheterization with coronary angiogram N/A 01/30/2015    Procedure: LEFT HEART CATHETERIZATION WITH CORONARY ANGIOGRAM;  Surgeon: Belva Crome, MD;  Location: [facility] CATH LAB;  Service: Cardiovascular;  Laterality: N/A;  . Cardiac catheterization  01/28/14    + CAD treat medically  . Coronary angioplasty with stent placement  01/30/2015    DES Promus  Premier to LAD by Dr Tamala Julian   Family History  Problem Relation Age of Onset  . CAD Brother     2 brothers - CABG  . Alzheimer's disease Sister   . CAD Sister   . Prostate cancer Brother   . Diabetes Other  entire family  . Asthma Brother     2 brothers    Social History  Substance Use Topics  . Smoking status: Never Smoker   . Smokeless tobacco: Never Used  . Alcohol Use: No    Review of Systems  Constitutional: Positive for diaphoresis. Negative for fever and chills.  HENT: Negative for congestion.   Eyes: Negative for visual disturbance.  Respiratory: Positive for cough and shortness of breath.   Cardiovascular: Positive for chest pain.  Gastrointestinal: Negative for vomiting and abdominal pain.  Genitourinary: Negative for dysuria and flank pain.  Musculoskeletal:  Negative for back pain, neck pain and neck stiffness.  Skin: Negative for rash.  Neurological: Negative for light-headedness and headaches.      Allergies  Review of patient's allergies indicates no known allergies.  Home Medications   Prior to Admission medications   Medication Sig Start Date End Date Taking? Authorizing Provider  amLODipine (NORVASC) 10 MG tablet Take 10 mg by mouth daily. 08/06/15  Yes Historical Provider, MD  aspirin EC 81 MG tablet Take 81 mg by mouth daily.   Yes Historical Provider, MD  Ciclopirox 1 % shampoo Apply 1 application topically at bedtime. At night after showering 11/20/14  Yes Historical Provider, MD  clonazePAM (KLONOPIN) 1 MG tablet Take 1 tablet (1 mg total) by mouth at bedtime as needed for anxiety. 08/31/15  Yes Alferd Apa Lowne, DO  dicyclomine (BENTYL) 10 MG capsule Take 1 capsule (10 mg total) by mouth 3 (three) times daily before meals. 07/30/15  Yes Ladene Artist, MD  esomeprazole (NEXIUM) 40 MG capsule Take 40 mg by mouth daily at 12 noon.   Yes Historical Provider, MD  fenofibrate 160 MG tablet Take 160 mg by mouth daily. 07/17/15  Yes Historical Provider, MD  furosemide (LASIX) 40 MG tablet Take 1 tablet (40 mg total) by mouth daily. 07/17/15  Yes Belva Crome, MD  hydrochlorothiazide (HYDRODIURIL) 12.5 MG tablet Take 2 tablets (25 mg total) by mouth daily. 06/05/15  Yes Brett Canales, PA-C  insulin regular human CONCENTRATED (HUMULIN R) 500 UNIT/ML injection Inject 0.14-0.18 ml (draw to 14-18 units on the insulin syringe) (70-90 units of insulin) 3x a day, 30 min before meals 08/25/15  Yes Philemon Kingdom, MD  metoprolol (LOPRESSOR) 50 MG tablet Take 1 tablet (50 mg total) by mouth 2 (two) times daily. 04/06/15  Yes Imogene Burn, PA-C  nitroGLYCERIN (NITROSTAT) 0.4 MG SL tablet Place 1 tablet (0.4 mg total) under the tongue every 5 (five) minutes as needed for chest pain (CP or SOB). 01/31/15  Yes Erlene Quan, PA-C  oxyCODONE-acetaminophen  (PERCOCET/ROXICET) 5-325 MG tablet Take 1 tablet by mouth every 12 (twelve) hours as needed. Reported on 10/19/2015 07/29/15  Yes Historical Provider, MD  Polyvinyl Alcohol-Povidone (REFRESH OP) Place 1 drop into both eyes daily as needed (for dry eyes).   Yes Historical Provider, MD  potassium chloride SA (K-DUR,KLOR-CON) 20 MEQ tablet Take 1 tablet (20 mEq total) by mouth daily. 09/21/15  Yes Belva Crome, MD  prasugrel (EFFIENT) 10 MG TABS tablet Take 1 tablet (10 mg total) by mouth daily. 08/25/15  Yes Belva Crome, MD  pravastatin (PRAVACHOL) 40 MG tablet Take 1 tablet (40 mg total) by mouth daily. 02/12/15  Yes Yvonne R Lowne, DO  propranolol (INDERAL) 40 MG tablet Take 1 tablet (40 mg total) by mouth 2 (two) times daily. 07/29/15  Yes Donika K Patel, DO  VENTOLIN HFA 108 (90 BASE) MCG/ACT  inhaler Inhale 2 puffs into the lungs every 6 (six) hours as needed. (SHORTNESS OR BREATH / WHEEZING) 06/22/15  Yes Historical Provider, MD  Blood Glucose Monitoring Suppl (ONE TOUCH ULTRA SYSTEM KIT) W/DEVICE KIT Use to test blood sugar daily as instructed. Dx code: E11.59 12/30/14   Philemon Kingdom, MD  Insulin Syringes, Disposable, U-100 1 ML MISC Use 4x a day 08/20/15   Philemon Kingdom, MD   BP 142/68 mmHg  Pulse 64  Temp(Src) 98.3 F (36.8 C) (Oral)  Resp 15  Ht _0  (1.727 m)  Wt 278 lb (126.1 kg)  BMI 42.28 kg/m2  SpO2 98% Physical Exam  Constitutional: He is oriented to person, place, and time. He appears well-developed and well-nourished.  HENT:  Head: Normocephalic and atraumatic.  Eyes: Conjunctivae are normal. Right eye exhibits no discharge. Left eye exhibits no discharge.  Neck: Normal range of motion. Neck supple. No tracheal deviation present.  Cardiovascular: Normal rate and regular rhythm.   Pulmonary/Chest: Effort normal and breath sounds normal.  Abdominal: Soft. He exhibits no distension. There is no tenderness. There is no guarding.  Musculoskeletal: He exhibits no edema.   Neurological: He is alert and oriented to person, place, and time.  Skin: Skin is warm. No rash noted.  Psychiatric: He has a normal mood and affect.  Nursing note and vitals reviewed.   ED Course  Procedures (including critical care time) Labs Review Labs Reviewed  BASIC METABOLIC PANEL - Abnormal; Notable for the following:    Sodium 146 (*)    Glucose, Bld 108 (*)    GFR calc non Af Amer 57 (*)    All other components within normal limits  CBC - Abnormal; Notable for the following:    Hemoglobin 12.0 (*)    HCT 37.2 (*)    All other components within normal limits  I-STAT TROPOININ, ED  CBG MONITORING, ED    Imaging Review Dg Chest 2 View  10/31/2015  CLINICAL DATA:  Chest pain and shortness of breath today. History CHF, hypertension, pneumonia. EXAM: CHEST  2 VIEW COMPARISON:  Chest radiograph June 04, 2015 FINDINGS: The cardiac silhouette appears mildly enlarged, accentuated by low inspiratory technique. Pulmonary vascular congestion. Mildly elevated RIGHT hemidiaphragm without pleural effusion or focal consolidation. No pneumothorax. Soft tissue planes and included osseous structure nonsuspicious. Mild degenerative change of the thoracic spine. Severe degenerative change of the included LEFT shoulder. IMPRESSION: Mild cardiomegaly and pulmonary vascular congestion in this low inspiratory examination. Electronically Signed   By: Elon Alas M.D.   On: 10/31/2015 05:26   I have personally reviewed and evaluated these images and lab results as part of my medical decision-making.   EKG Interpretation   Date/Time:  Saturday October 31 2015 04:56:03 EST Ventricular Rate:  62 PR Interval:  239 QRS Duration: 98 QT Interval:  426 QTC Calculation: 433 R Axis:   46 Text Interpretation:  Sinus rhythm Prolonged PR interval Anteroseptal  infarct, old similar previous Confirmed by Jaidin Richison  MD, Nancye Grumbine ([id number]) on  10/31/2015 5:11:27 AM      MDM   Final diagnoses:  None    Patient is significant cardiac history presents with intermittent chest pain and diaphoresis. With cardiac history plan for telemetry observation for serial troponins and further workup. Initial troponin negative. EKG similar previous. Chest x-ray no pneumonia.  The patients results and plan were reviewed and discussed.   Any x-rays performed were independently reviewed by myself.   Differential diagnosis were considered with the presenting  HPI.  Medications  morphine 4 MG/ML injection 4 mg (not administered)    Filed Vitals:   10/31/15 0445 10/31/15 0454  BP:  142/68  Pulse:  64  Temp:  98.3 F (36.8 C)  TempSrc:  Oral  Resp:  15  Height:  _0  (1.727 m)  Weight:  278 lb (126.1 kg)  SpO2: 99% 98%    Final diagnoses:  None    Admission/ observation were discussed with the admitting physician, patient and/or family and they are comfortable with the plan.     Elnora Morrison, MD 10/31/15 4712  Elnora Morrison, MD 10/31/15 [phone]

## 2015-10-31 NOTE — Progress Notes (Signed)
Pt refuses to send wallet to be locked up. States removed credit cards prior to adm. I suggested sending home with family if available. Carroll Kinds RN

## 2015-10-31 NOTE — ED Notes (Signed)
Contacted Dr Alcario Drought about pt's CBG of 78 and due to pt being NPO Dr Alcario Drought gave verbal order to give pt an amp of D50.

## 2015-10-31 NOTE — Consult Note (Signed)
CARDIOLOGY CONSULT NOTE   Patient ID: Edward [name] MRN: [id number] DOB/AGE: 11-12-1940 74 y.o.  Admit date: 10/31/2015  Requesting Physician: Dr. Wynelle Cleveland  Primary Physician   Garnet Koyanagi, DO Primary Cardiologist   Dr Tamala Julian Reason for Consultation   Chest pain  HPI: Edward [name] is a 74 y.o. male with a history of CAD s/p DES to LAD (01/2015), hx of TIA, HLD, GERD, HTN, uncontrolled DMT2, and OSA who presented to [facility] on 10/31/15 (late last night) with recurrent chest pain/dysnepnea.   According to the past medical record, patient had a mini heart attack at [facility] in 2013. His last 2D ECHO on 01/28/2014 showed EF 60-65%, severe concentric hypertrophy consider global variant HCM, grade 1 diastolic dysfunction, borderline RVH.   He had cardiac catheterization in 12/2013 which showed 50% mid LAD stenosis treated medically. He underwent a Myoview for exertional symptoms on 01/20/2015 which showed anteroapical partially reversible defect. He underwent cardiac catheterization by Dr. Tamala Julian on 01/30/2015 which showed 40% proximal to mid LAD, 85% mid LAD stenosis treated with Promus Premier DES, 50-70% distal LAD stenosis.   He went to the emergency room 03/21/15 with chest pain and leg pain. Chest pain was atypical and MI was ruled out. Dopplers were negative for DVT. D-dimer was negative troponins negative. EKG unchanged.  Seen by Estella Husk PA-C on 04/06/15: he was hypertensive and had not been taking his Lopressor and lisinopril and eating a lot of salt.   He was admitted 06/2015 for chest pain/sob and ruled out for MI. He underwent nuclear stress test while admitted which revealed a small and mild fixed apical defect and normal left ventricular regional and global systolic function. There was no reversible ischemia. The study was considered low risk. The size and severity of apical defect was substantially improved compared to prior study in March. Patient was also  switched from Brilinta to Plavix case he was having a side effect from the Brilinta (SOB). He was intolerant to plavix (caused cramping per patient) and switched back to Brilinta.   Seen last by Dr. Tamala Julian on 08/25/15 with increasing SOB. As above, there was worry this was caused by Brilinta and he was switched to plavix, which he did not tolerate. The patient was still SOB and switched to Eliquis. A recent CT scan demonstrated the possibility of some interstitial scarring or edema and he was referred to pulmonology. He saw Dr. Lake Bells on 08/27/15 who felt his dyspnea was multifactorial primarily from obesity and deconditioning. There is very mild groundglass opacification in the lower lobes which radiology felt may be pulmonary edema. However, he felt this was from acid reflux leading to chronic microaspiration as he reported significant wheezing and mucus production in the mornings. Of note, even though he said he has been told that he has asthma there was no evidence of airflow obstruction on his lung function testing and he has no wheezing on exam. He planned to monitor for possible interstitial lung disease with repeat PFTs in 6 months and repeat imaging in 1 year. He wanted him to diet, exercise, and try to lose weight.  He has been trying to exercise but he is limited by sharp chest pain and dyspnea so he couldn't continue. For the last 3-4 weeks his chronic SOB has been worse. He has also been having chest pain off and on off since October. However, this morning it was different because his chest was so tight and he was  SOB and diaphoretic. Chest pain woke him up from sleep and radiated to shoulder. He was trying to get up to get a SL NTG and fell. That is when he called EMS with his LifeAlert. Also he has been having worsening LE edema, he has chronic 3 pillow orthopnea, and PND. He is no longer able to put on his shoes due to LE edema. He currently has 8/10 chest pain and SOB. He has been complaint with  all his medications and reports following a low salt diet.     Past Medical History  Diagnosis Date  . Hypertension   . CAD (coronary artery disease)     a. Pt reports "small heart attack" in 2013 at [facility], denies need for stent or CABG, does not know findings of cath.  Marland Kitchen TIA (transient ischemic attack) 2002  . Dyslipidemia, goal LDL below 70 01/29/2014  . GERD (gastroesophageal reflux disease)   . Enlarged prostate   . Shortness of breath     exertion  . DM type 2 (diabetes mellitus, type 2), insulin dependent 01/29/2014    fasting cbg 50-120 with new regimen  . Anxiety   . Heart attack ([place]) 02/03/2014    "mild heart attack"  . Pneumonia 01/2013  . Pneumonia 03/2014    hospitalized  . Sleep apnea     does not use cpap (06/04/2015)  . BOFBPZWC(585.2)     "monthly" (06/04/2015)  . Arthritis     'all over"  . Tremor of both hands   . Adenomatous colon polyp 04/2011  . Diverticulosis   . Internal hemorrhoid      Past Surgical History  Procedure Laterality Date  . Total knee arthroplasty Bilateral   . Lumbar disc surgery    . Cataract extraction Bilateral   . Joint replacement    . Back surgery    . Cataract extraction, bilateral Bilateral   . Shoulder arthroscopy Right 07/30/2014    Procedure: Right Shoulder Arthroscopy, Debridement, Decompression, Manipulation Under Anesthesia;  Surgeon: Newt Minion, MD;  Location: [place];  Service: Orthopedics;  Laterality: Right;  . Left heart catheterization with coronary angiogram N/A 01/28/2014    Procedure: LEFT HEART CATHETERIZATION WITH CORONARY ANGIOGRAM;  Surgeon: Sinclair Grooms, MD;  Location: [facility] CATH LAB;  Service: Cardiovascular;  Laterality: N/A;  . Left heart catheterization with coronary angiogram N/A 01/30/2015    Procedure: LEFT HEART CATHETERIZATION WITH CORONARY ANGIOGRAM;  Surgeon: Belva Crome, MD;  Location: [facility] CATH LAB;  Service: Cardiovascular;  Laterality: N/A;  . Cardiac catheterization  01/28/14     + CAD treat medically  . Coronary angioplasty with stent placement  01/30/2015    DES Promus  Premier to LAD by Dr Tamala Julian    No Known Allergies  I have reviewed the patient's current medications . amLODipine  10 mg Oral Daily  . aspirin EC  81 mg Oral Daily  . dextrose  1 ampule Intravenous Once  . dicyclomine  10 mg Oral TID AC  . fenofibrate  160 mg Oral Daily  . furosemide  40 mg Oral Daily  . hydrochlorothiazide  25 mg Oral Daily  . insulin aspart  0-20 Units Subcutaneous 6 times per day  . metoprolol  50 mg Oral BID  . nitroGLYCERIN  0.5 inch Topical 4 times per day  . pantoprazole  80 mg Oral Q1200  . potassium chloride SA  20 mEq Oral Daily  . prasugrel  10 mg Oral Daily  .  pravastatin  40 mg Oral Daily  . propranolol  40 mg Oral BID   . heparin 1,500 Units/hr (10/31/15 3335)   acetaminophen, albuterol, clonazePAM, morphine injection, nitroGLYCERIN, ondansetron (ZOFRAN) IV, oxyCODONE-acetaminophen  Prior to Admission medications   Medication Sig Start Date End Date Taking? Authorizing Provider  amLODipine (NORVASC) 10 MG tablet Take 10 mg by mouth daily. 08/06/15  Yes Historical Provider, MD  aspirin EC 81 MG tablet Take 81 mg by mouth daily.   Yes Historical Provider, MD  Ciclopirox 1 % shampoo Apply 1 application topically at bedtime. At night after showering 11/20/14  Yes Historical Provider, MD  clonazePAM (KLONOPIN) 1 MG tablet Take 1 tablet (1 mg total) by mouth at bedtime as needed for anxiety. 08/31/15  Yes Alferd Apa Lowne, DO  dicyclomine (BENTYL) 10 MG capsule Take 1 capsule (10 mg total) by mouth 3 (three) times daily before meals. 07/30/15  Yes Ladene Artist, MD  esomeprazole (NEXIUM) 40 MG capsule Take 40 mg by mouth daily at 12 noon.   Yes Historical Provider, MD  fenofibrate 160 MG tablet Take 160 mg by mouth daily. 07/17/15  Yes Historical Provider, MD  furosemide (LASIX) 40 MG tablet Take 1 tablet (40 mg total) by mouth daily. 07/17/15  Yes Belva Crome, MD    hydrochlorothiazide (HYDRODIURIL) 12.5 MG tablet Take 2 tablets (25 mg total) by mouth daily. 06/05/15  Yes Brett Canales, PA-C  insulin regular human CONCENTRATED (HUMULIN R) 500 UNIT/ML injection Inject 0.14-0.18 ml (draw to 14-18 units on the insulin syringe) (70-90 units of insulin) 3x a day, 30 min before meals 08/25/15  Yes Philemon Kingdom, MD  metoprolol (LOPRESSOR) 50 MG tablet Take 1 tablet (50 mg total) by mouth 2 (two) times daily. 04/06/15  Yes Imogene Burn, PA-C  nitroGLYCERIN (NITROSTAT) 0.4 MG SL tablet Place 1 tablet (0.4 mg total) under the tongue every 5 (five) minutes as needed for chest pain (CP or SOB). 01/31/15  Yes Erlene Quan, PA-C  oxyCODONE-acetaminophen (PERCOCET/ROXICET) 5-325 MG tablet Take 1 tablet by mouth every 12 (twelve) hours as needed. Reported on 10/19/2015 07/29/15  Yes Historical Provider, MD  Polyvinyl Alcohol-Povidone (REFRESH OP) Place 1 drop into both eyes daily as needed (for dry eyes).   Yes Historical Provider, MD  potassium chloride SA (K-DUR,KLOR-CON) 20 MEQ tablet Take 1 tablet (20 mEq total) by mouth daily. 09/21/15  Yes Belva Crome, MD  prasugrel (EFFIENT) 10 MG TABS tablet Take 1 tablet (10 mg total) by mouth daily. 08/25/15  Yes Belva Crome, MD  pravastatin (PRAVACHOL) 40 MG tablet Take 1 tablet (40 mg total) by mouth daily. 02/12/15  Yes Yvonne R Lowne, DO  propranolol (INDERAL) 40 MG tablet Take 1 tablet (40 mg total) by mouth 2 (two) times daily. 07/29/15  Yes Donika K Patel, DO  VENTOLIN HFA 108 (90 BASE) MCG/ACT inhaler Inhale 2 puffs into the lungs every 6 (six) hours as needed. (SHORTNESS OR BREATH / WHEEZING) 06/22/15  Yes Historical Provider, MD  Blood Glucose Monitoring Suppl (ONE TOUCH ULTRA SYSTEM KIT) W/DEVICE KIT Use to test blood sugar daily as instructed. Dx code: E11.59 12/30/14   Philemon Kingdom, MD  Insulin Syringes, Disposable, U-100 1 ML MISC Use 4x a day 08/20/15   Philemon Kingdom, MD     Social History   Social History  .  Marital Status: Married    Spouse Name: N/A  . Number of Children: 0  . Years of Education: N/A  Occupational History  . retired    Social History Main Topics  . Smoking status: Never Smoker   . Smokeless tobacco: Never Used  . Alcohol Use: No  . Drug Use: No  . Sexual Activity: No   Other Topics Concern  . 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   He lives alone.  His wife is living in [place] at this time.  They do not have children.   Highest level of education:  College graduate    Family Status  Relation Status Death Age  . Brother Deceased   . Sister Deceased   . Sister Deceased   . Brother Deceased   . Brother Deceased   . Mother Deceased   . Father Deceased   . Maternal Grandfather Deceased   . Maternal Grandmother Deceased   . Paternal Grandmother Deceased   . Paternal Grandfather Deceased    Family History  Problem Relation Age of Onset  . CAD Brother     2 brothers - CABG  . Alzheimer's disease Sister   . CAD Sister   . Prostate cancer Brother   . Diabetes Other     entire family  . Asthma Brother     2 brothers      ROS:  Full 14 point review of systems complete and found to be negative unless listed above.  Physical Exam: Blood pressure 164/62, pulse 65, temperature 98.3 F (36.8 C), temperature source Oral, resp. rate 16, height '5\' 8"'  (1.727 m), weight 278 lb (126.1 kg), SpO2 99 %.  General: Well developed, well nourished, male in no acute distress. Appears SOB. Obese Head: Eyes PERRLA, No xanthomas.   Normocephalic and atraumatic, oropharynx without edema or exudate. Dentition:  Lungs: mild crackles at bases Heart: HRRR S1 S2, no rub/gallop, Heart regular rate and rhythm with S1, S2  murmur. pulses are 2+ extrem.   Neck: No carotid bruits. No lymphadenopathy. + JVD. Abdomen: Bowel sounds present, abdomen soft and non-tender without masses or  hernias noted. Msk:  No spine or cva tenderness. No weakness, no joint deformities or effusions. Extremities: No clubbing or cyanosis.  2+ bilateral  LE edema.  Neuro: Alert and oriented X 3. No focal deficits noted. Psych:  Good affect, responds appropriately Skin: No rashes or lesions noted.  Labs:   Lab Results  Component Value Date   WBC 5.9 10/31/2015   HGB 12.0* 10/31/2015   HCT 37.2* 10/31/2015   MCV 87.9 10/31/2015   PLT 238 10/31/2015   No results for input(s): INR in the last 72 hours.  Recent Labs Lab 10/31/15 0510  NA 146*  K 3.6  CL 107  CO2 30  BUN 11  CREATININE 1.22  CALCIUM 9.3  GLUCOSE 108*   No results found for: MG No results for input(s): CKTOTAL, CKMB, TROPONINI in the last 72 hours.  Recent Labs  10/31/15 0500  TROPIPOC 0.01   PRO B NATRIURETIC PEPTIDE (BNP)  Date/Time Value Ref Range Status  07/24/2015 08:41 AM 24.0 0.0 - 100.0 pg/mL Final  07/17/2015 11:39 AM 35.0 0.0 - 100.0 pg/mL Final   Lab Results  Component Value Date   CHOL 197 07/08/2015   HDL 40.50 07/08/2015   LDLCALC 100* 02/09/2015   TRIG 242.0* 07/08/2015    Echo: 01/28/2014 LV EF: 60% -  65% Study Conclusions - Left ventricle: The cavity  size was normal. 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 Systolic function was normal. The estimated ejection fraction was in the range of 60% to 65%. Wall motion was normal; there were no regional wall motion abnormalities. Doppler parameters are consistent with abnormal left ventricular relaxation (grade 1 diastolic dysfunction). The E/e' ratio is <10, suggesting normal LV filling pressure. - Left atrium: The atrium was normal in size. - Right ventricle: The cavity size was normal. There is borderline RVH. Systolic function was normal. - Right atrium: The atrium was at the upper limits of normal in size. - Pericardium, extracardiac: There was no  pericardial effusion.  ECG: NSR HR 62. 1st deg AV block  Radiology:  Dg Chest 2 View  10/31/2015  CLINICAL DATA:  Chest pain and shortness of breath today. History CHF, hypertension, pneumonia. EXAM: CHEST  2 VIEW COMPARISON:  Chest radiograph June 04, 2015 FINDINGS: The cardiac silhouette appears mildly enlarged, accentuated by low inspiratory technique. Pulmonary vascular congestion. Mildly elevated RIGHT hemidiaphragm without pleural effusion or focal consolidation. No pneumothorax. Soft tissue planes and included osseous structure nonsuspicious. Mild degenerative change of the thoracic spine. Severe degenerative change of the included LEFT shoulder. IMPRESSION: Mild cardiomegaly and pulmonary vascular congestion in this low inspiratory examination. Electronically Signed   By: Elon Alas M.D.   On: 10/31/2015 05:26   Left heart catheterization 01/30/15 ANGIOGRAPHIC DATA: The left main coronary artery is widely patent. There is distal 20% narrowing.  The left anterior descending artery is dominant. It wraps around the left ventricular apex. There is irregularity with up to 40% narrowing in the proximal to mid vessel. The mid vessel contains tandem significant stenoses with the proximal lesion located beyond the first diagonal of 85%. The more distal lesion appears to be in the 50-70% range. There is clear progression of the proximal lesion compared to the prior angiogram. The LAD wraps around the left ventricular apex and into the inferior interventricular groove  The left circumflex artery is widely patent. It gives origin to 3 small obtuse marginal branches. Irregularities are noted and no high-grade obstruction is seen..  The right coronary artery is dominant. He contains proximal mid and distal irregularities with 30-50% narrowing. No focally severe obstruction is noted. A twin PDA system arises distally and is free of any significant obstruction.  PCI RESULTS: The LAD contained mid  vessel tandem stenoses of 85% and 60%. After predilatation a 3.0 x 24 Promus Premier stent was deployed and post dilated to 3.5 mm in diameter. Brisk TIMI grade 3 flow was noted and no significant stenosis was present post PCI. LEFT VENTRICULOGRAM: Left ventricular angiogram was done in the 30 RAO projection and revealed mild apical hypokinesis. EF 55%. IMPRESSIONS: 1. Severe mid LAD stenosis/tandem with 85/60% stenosis reduced to 0% with TIMI grade 3 flow after DES implantation [employer]). Perfusion abnormality on the recent myocardial perfusion study and progressive angina fit the clinical picture. 2. Widely patent circumflex and right coronary 3. Normal left ventricular systolic function with elevated end-diastolic pressure compatible with diastolic heart failure RECOMMENDATION: Aspirin and Brilinta for at least 12 months Optimize medical therapy to control blood pressure and lower left ventricular end-diastolic pressure. We should consider adding low-dose diuretic therapy if renal function does not bump after the contrast load. Consider adding hydrochlorothiazide 12.5 mg per day in a.m prior to discharge. Should be ready for discharge in a.m. assuming no complications.    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 male with a history of CAD s/p DES to LAD (01/2015), hx of TIA, HLD, GERD, HTN, uncontrolled DMT2, and OSA who presented to [facility] on 10/31/15 (late last night) with recurrent chest pain.   Chest pain/SOB: patient has a chronic history of this and multiple admissions for the same. He is very concerned this is his heart and wanting a heart catheterization. He has a normal nuclear stress test in  06/2015. -- Troponin negative x1 and ECG with no acute ST or TW changes.  -- Placed on IV heparin. Continue ASA, Effient, Lopressor 67m BID and pravastatin 418mdaily. We will plan for medical management for right now. We will diurese him with IV lasix and see if his sx improve. If he rules out and he continues to have dyspnea/sob we will plan for close outpatient follow up with Dr. SmTamala Juliannd he can decide on whether repeat LHC is necessary. We will add long acting nitrates.   Acute on chronic diastolic CHF: he appears volume overloaded on exam and pulm vasc congestion on CXR. I have ordered a BNP. He has been on Lasix 4092moqd and HCTZ 12.5 mg qd. He reports a low salt diet but i doubt this. Creat 1.22. I think he may need IV diuresis. Will start 64m79m daily and watch kidney function.   HTN: BP controlled. Continue amlodipine 10mg69mpressor 50mg 33m propranolol 64mg B23m for tremor). Will plan to start imdur 30mg . 8mD: continue pravastatin 64mg  DM29mSSI per IM  Signed: THOMPSON,Eileen Stanford/31/2016 8:12 AM  Pager [phone] seen, examined the patient, and reviewed the above assessment and plan. On exam, mildly volume overloaded. Changes to above are made where necessary.  I am not convinced that he has ACS.   He has had fairly chronic SOB and chest pain.  I would advise a conservative approach.  Particularly given low risk myoview 8/16. Medical management is advised.  If CMs negative overnight and symptoms improve, anticipate discharge without further CV assessment this admisssion with close follow-up by Dr Smith.  ITamala Julianient rules in for MI or cannot return to baseline, would then consider additional testing.  Co Sign: Belmira Daley AllThompson Grayer1/2016 10:35 AM

## 2015-10-31 NOTE — ED Notes (Signed)
Pt given orange juice to drink for CBG of 78

## 2015-10-31 NOTE — ED Notes (Signed)
CHECKED CBG 84, RN KEVIN INFORMED

## 2015-10-31 NOTE — ED Notes (Signed)
Pt received 324asa and 3 nitro in route which reduced pain initially but pain came right back.

## 2015-10-31 NOTE — Progress Notes (Signed)
ANTICOAGULATION CONSULT NOTE   Pharmacy Consult for Heparin Indication: chest pain/ACS  No Known Allergies  Patient Measurements: Height: 5\' 8"  (172.7 cm) Weight: 278 lb (126.1 kg) IBW/kg (Calculated) : 68.4 Heparin Dosing Weight: 97 kg  Vital Signs: Temp: 97.9 F (36.6 C) (12/31 1313) Temp Source: Oral (12/31 1313) BP: 146/48 mmHg (12/31 1313) Pulse Rate: 65 (12/31 1313)  Labs:  Recent Labs  10/31/15 0510 10/31/15 0738 10/31/15 0951 10/31/15 1426  HGB 12.0*  --   --   --   HCT 37.2*  --   --   --   PLT 238  --   --   --   HEPARINUNFRC  --   --   --  0.50  CREATININE 1.22  --   --   --   TROPONINI  --  <0.03 0.03  --     Estimated Creatinine Clearance: 69.8 mL/min (by C-G formula based on Cr of 1.22).   Medical History: Past Medical History  Diagnosis Date  . Hypertension   . CAD (coronary artery disease)     a. Pt reports "small heart attack" in 2013 at [facility] denies need for stent or CABG, does not know findings of cath.  Marland Kitchen TIA (transient ischemic attack) 2002  . Dyslipidemia, goal LDL below 70 01/29/2014  . GERD (gastroesophageal reflux disease)   . Enlarged prostate   . Shortness of breath     exertion  . DM type 2 (diabetes mellitus, type 2), insulin dependent 01/29/2014    fasting cbg 50-120 with new regimen  . Anxiety   . Heart attack ([place]) 02/03/2014    "mild heart attack"  . Pneumonia 01/2013  . Pneumonia 03/2014    hospitalized  . Sleep apnea     does not use cpap (06/04/2015)  . [id number])     "monthly" (06/04/2015)  . Arthritis     'all over"  . Tremor of both hands   . Adenomatous colon polyp 04/2011  . Diverticulosis   . Internal hemorrhoid     Assessment: 74 y.o. M presents with CP. To begin heparin for r/o ACS. CBC ok on admission. No AC PTA.  HL at goal (0.5) this afternoon 1500 units/hr. No bleeding issues noted, will check confirmatory level tonight.  Current plan is for medical management.  Goal of  Therapy:  Heparin level 0.3-0.7 units/ml Monitor platelets by anticoagulation protocol: Yes  Plan:  Continue Heparin gtt at 1500 units/hr Confirmatory HL tonight Daily heparin level and CBC  Erin Hearing PharmD., BCPS Clinical Pharmacist Pager [phone] 10/31/2015 3:05 PM

## 2015-10-31 NOTE — Progress Notes (Signed)
ANTICOAGULATION CONSULT NOTE - Initial Consult  Pharmacy Consult for Heparin Indication: chest pain/ACS  No Known Allergies  Patient Measurements: Height: 5\' 8"  (172.7 cm) Weight: 278 lb (126.1 kg) IBW/kg (Calculated) : 68.4 Heparin Dosing Weight: 97 kg  Vital Signs: Temp: 98.3 F (36.8 C) (12/31 0454) Temp Source: Oral (12/31 0454) BP: 146/63 mmHg (12/31 0500) Pulse Rate: 63 (12/31 0500)  Labs:  Recent Labs  10/31/15 0510  HGB 12.0*  HCT 37.2*  PLT 238  CREATININE 1.22    Estimated Creatinine Clearance: 69.8 mL/min (by C-G formula based on Cr of 1.22).   Medical History: Past Medical History  Diagnosis Date  . Hypertension   . CAD (coronary artery disease)     a. Pt reports "small heart attack" in 2013 at [facility], denies need for stent or CABG, does not know findings of cath.  Marland Kitchen TIA (transient ischemic attack) 2002  . Dyslipidemia, goal LDL below 70 01/29/2014  . GERD (gastroesophageal reflux disease)   . Enlarged prostate   . Shortness of breath     exertion  . DM type 2 (diabetes mellitus, type 2), insulin dependent 01/29/2014    fasting cbg 50-120 with new regimen  . Anxiety   . Heart attack ([place]) 02/03/2014    "mild heart attack"  . Pneumonia 01/2013  . Pneumonia 03/2014    hospitalized  . Sleep apnea     does not use cpap (06/04/2015)  . [id number])     "monthly" (06/04/2015)  . Arthritis     'all over"  . Tremor of both hands   . Adenomatous colon polyp 04/2011  . Diverticulosis   . Internal hemorrhoid     Medications:  See electronic med rec  Assessment: 74 y.o. M presents with CP. To begin heparin for r/o ACS. CBC ok on admission. No AC PTA.  Noted pt with therapeutic heparin level on 1500 units/hr Aug 2016.  Goal of Therapy:  Heparin level 0.3-0.7 units/ml Monitor platelets by anticoagulation protocol: Yes   Plan:  Heparin IV bolus 4000 units Heparin gtt at 1500 units/hr Will f/u heparin level in 8 hours Daily  heparin level and CBC  Sherlon Handing, PharmD, BCPS Clinical pharmacist, pager [phone] 10/31/2015,6:10 AM

## 2015-10-31 NOTE — ED Notes (Signed)
Dr Gardner in room 

## 2015-10-31 NOTE — ED Notes (Signed)
Dr. Alcario Drought at bedside.  Patient given 2 apple juices, c/o of his "sugar dropping".  Patient tolerating oral fluids.

## 2015-10-31 NOTE — Progress Notes (Signed)
Patient Ambulated approximately 200 feet.  SaO2 low was 96%, HR- 110, Respiratory rate max was 38.  Patient tolerated procedure well.

## 2015-11-01 DIAGNOSIS — J849 Interstitial pulmonary disease, unspecified: Secondary | ICD-10-CM

## 2015-11-01 DIAGNOSIS — E119 Type 2 diabetes mellitus without complications: Secondary | ICD-10-CM

## 2015-11-01 DIAGNOSIS — J209 Acute bronchitis, unspecified: Secondary | ICD-10-CM

## 2015-11-01 LAB — HEPARIN LEVEL (UNFRACTIONATED): Heparin Unfractionated: 0.66 IU/mL (ref 0.30–0.70)

## 2015-11-01 LAB — CBC
HCT: 35.1 % — ABNORMAL LOW (ref 39.0–52.0)
Hemoglobin: 11.3 g/dL — ABNORMAL LOW (ref 13.0–17.0)
MCH: 28.3 pg (ref 26.0–34.0)
MCHC: 32.2 g/dL (ref 30.0–36.0)
MCV: 87.8 fL (ref 78.0–100.0)
Platelets: 227 10*3/uL (ref 150–400)
RBC: 4 MIL/uL — AB (ref 4.22–5.81)
RDW: 13.7 % (ref 11.5–15.5)
WBC: 6.2 10*3/uL (ref 4.0–10.5)

## 2015-11-01 LAB — GLUCOSE, CAPILLARY
GLUCOSE-CAPILLARY: 239 mg/dL — AB (ref 65–99)
Glucose-Capillary: 226 mg/dL — ABNORMAL HIGH (ref 65–99)
Glucose-Capillary: 302 mg/dL — ABNORMAL HIGH (ref 65–99)

## 2015-11-01 MED ORDER — DM-GUAIFENESIN ER 30-600 MG PO TB12: 1.0000 | ORAL_TABLET | Freq: Two times a day (BID) | ORAL | Status: DC

## 2015-11-01 MED ORDER — MOMETASONE FURO-FORMOTEROL FUM 100-5 MCG/ACT IN AERO: 2.0000 | INHALATION_SPRAY | Freq: Two times a day (BID) | RESPIRATORY_TRACT | Status: DC

## 2015-11-01 MED ORDER — DICLOFENAC SODIUM 1 % TD GEL
2.0000 g | Freq: Four times a day (QID) | TRANSDERMAL | Status: DC
  Administered 2015-11-01 (×2): 2 g via TOPICAL
  Filled 2015-11-01: qty 100

## 2015-11-01 MED ORDER — ISOSORBIDE MONONITRATE ER 30 MG PO TB24: 30.0000 mg | ORAL_TABLET | Freq: Every day | ORAL | Status: DC

## 2015-11-01 MED ORDER — INSULIN ASPART 100 UNIT/ML ~~LOC~~ SOLN
5.0000 [IU] | Freq: Three times a day (TID) | SUBCUTANEOUS | Status: DC
  Administered 2015-11-01 (×2): 5 [IU] via SUBCUTANEOUS

## 2015-11-01 MED ORDER — DICLOFENAC SODIUM 1 % TD GEL: 2.0000 g | Freq: Four times a day (QID) | TRANSDERMAL | Status: DC

## 2015-11-01 MED ORDER — DM-GUAIFENESIN ER 30-600 MG PO TB12
1.0000 | ORAL_TABLET | Freq: Two times a day (BID) | ORAL | Status: DC
  Administered 2015-11-01: 1 via ORAL
  Filled 2015-11-01: qty 1

## 2015-11-01 NOTE — Progress Notes (Signed)
SUBJECTIVE: The patient is doing well today.  SOB appears near baseline,  Few sharp fleeting chest pains only.     Marland Kitchen amLODipine  10 mg Oral Daily  . aspirin EC  81 mg Oral Daily  . dextromethorphan-guaiFENesin  1 tablet Oral BID  . dextrose  1 ampule Intravenous Once  . diclofenac sodium  2 g Topical QID  . dicyclomine  10 mg Oral TID AC  . fenofibrate  160 mg Oral Daily  . furosemide  40 mg Intravenous Daily  . hydrochlorothiazide  25 mg Oral Daily  . insulin aspart  0-20 Units Subcutaneous 6 times per day  . insulin aspart  5 Units Subcutaneous TID WC  . isosorbide mononitrate  30 mg Oral Daily  . metoprolol  50 mg Oral BID  . mometasone-formoterol  2 puff Inhalation BID  . pantoprazole  80 mg Oral Q1200  . potassium chloride SA  20 mEq Oral Daily  . prasugrel  10 mg Oral Daily  . pravastatin  40 mg Oral Daily  . propranolol  40 mg Oral BID      OBJECTIVE: Physical Exam: Filed Vitals:   11/01/15 0700 11/01/15 0707 11/01/15 0719 11/01/15 0915  BP: 151/68   123/65  Pulse: 69 65 59 64  Temp:    98.4 F (36.9 C)  TempSrc:    Oral  Resp: 24 17 17 18   Height:      Weight:      SpO2: 96% 96% 95% 97%    Intake/Output Summary (Last 24 hours) at 11/01/15 0945 Last data filed at 11/01/15 0817  Gross per 24 hour  Intake    864 ml  Output   1675 ml  Net   -811 ml    Telemetry reveals sinus rhythm  GEN- The patient is elderly appearing, alert and oriented x 3 today.   Head- normocephalic, atraumatic Eyes-  Sclera clear, conjunctiva pink Ears- hearing intact Oropharynx- clear Neck- supple,   Lungs- Clear to ausculation bilaterally, normal work of breathing Heart- Regular rate and rhythm  GI- soft, NT, ND, + BS Extremities- no clubbing, cyanosis, + chronic venous stasis/ edema Skin- no rash or lesion Psych- euthymic mood, full affect Neuro- strength and sensation are intact  LABS: Basic Metabolic Panel:  Recent Labs  10/31/15 0510  NA 146*  K 3.6  CL 107   CO2 30  GLUCOSE 108*  BUN 11  CREATININE 1.22  CALCIUM 9.3   Liver Function Tests: No results for input(s): AST, ALT, ALKPHOS, BILITOT, PROT, ALBUMIN in the last 72 hours. No results for input(s): LIPASE, AMYLASE in the last 72 hours. CBC:  Recent Labs  10/31/15 0510 11/01/15 0602  WBC 5.9 6.2  HGB 12.0* 11.3*  HCT 37.2* 35.1*  MCV 87.9 87.8  PLT 238 227   Cardiac Enzymes:  Recent Labs  10/31/15 0738 10/31/15 0951 10/31/15 1426  TROPONINI <0.03 0.03 <0.03   RADIOLOGY: Dg Chest 2 View  10/31/2015  CLINICAL DATA:  Chest pain and shortness of breath today. History CHF, hypertension, pneumonia. EXAM: CHEST  2 VIEW COMPARISON:  Chest radiograph June 04, 2015 FINDINGS: The cardiac silhouette appears mildly enlarged, accentuated by low inspiratory technique. Pulmonary vascular congestion. Mildly elevated RIGHT hemidiaphragm without pleural effusion or focal consolidation. No pneumothorax. Soft tissue planes and included osseous structure nonsuspicious. Mild degenerative change of the thoracic spine. Severe degenerative change of the included LEFT shoulder. IMPRESSION: Mild cardiomegaly and pulmonary vascular congestion in this low inspiratory examination. Electronically Signed  By: Elon Alas M.D.   On: 10/31/2015 05:26    ASSESSMENT AND PLAN:   Edward [name] is a 75 y.o. male with a history of CAD s/p DES to LAD (01/2015), hx of TIA, HLD, GERD, HTN, uncontrolled DMT2, and OSA who presented to [facility] on 10/31/15 with chest pain and SOB.  Chest pain/SOB: patient has a chronic history of this and multiple admissions for the same.  He has a normal nuclear stress test in 06/2015. I think that his chest pain is currently atypical and that his SOB is near baseline. CMs negative and no dynamic ekg changes Stop heparin drip No further inpatient workup planned Imdur added this admission  Acute on chronic diastolic CHF: Would increase lasix at discharge to 40mg  daily (was taking  QOD)  HTN: Stable No change required today   HLD: continue pravastatin 40mg   DMT2: SSI per IM  No further inpatient CV testing planned OK to discharge from my standpoint Will arrange close follow-up with Dr Charisse March, MD 11/01/2015 9:45 AM

## 2015-11-01 NOTE — Discharge Summary (Signed)
Physician Discharge Summary  [name] EXB:[id number] DOB: 17-Apr-1941 DOA: 10/31/2015  PCP: Garnet Koyanagi, DO  Admit date: 10/31/2015 Discharge date: 11/01/2015  Time spent: 50 minutes  Recommendations for Outpatient Follow-up:  1. F/u on cough/ congestion  Discharge Condition: stable    Discharge Diagnoses:  Principal Problem:   Chest pain with high risk for cardiac etiology Active Problems:   CAD S/P LAD DES 01/30/15   DM type 2 (diabetes mellitus, type 2), insulin dependent   HTN (hypertension)   Acute bronchitis   ILD (interstitial lung disease) ([place])   History of present illness:  Edward [name] is a 75 y.o. male with h/o poorly controlled DM2, currently on U500 for treatment, CAD with stent in April of 2016 after his LAD disease progressed somewhat significantly from just a year prior. Last stress test was in August of 2016 and was low risk.  Hospital Course:  Chest pain- h/o CAD s/p stenting - sharp and intermittent pain atypical for anginal pain - has ruled out for MI- no further work up recommended at this time by cardiology- needs to f/u with Dr Tamala Julian as outpt  Diaphoresis and dyspnea on exertion- ILD diagnosed by Dr Lake Bells - normal stress test in 8/16 - suspect deconditioning- pulm rehab ordered by Dr Lake Bells which I agree with  Cough/ congestion/ sharp chest pain - clear CXR - no wheezing on exam - have added Dulera, Mucinex and Voltaren gel for any musculoskeletal component of pain - if no improvement in symptoms in 1 wk, advised to f/u with Dr Lake Bells  DM2 uncontrolled - A1c 8.9 a few wks ago - need to f/u with PCP to discuss better control- sugars have been in 2-300s in the hospital as we do not have U500 in the hospital  Procedures:  none  Consultations:  Cardiology   Discharge Exam: [facility] Weights   10/31/15 0454 11/01/15 0410  Weight: 126.1 kg (278 lb) 129.6 kg (285 lb 11.5 oz)   Filed Vitals:   11/01/15 0915 11/01/15 1003  BP: 123/65  127/90  Pulse: 64 67  Temp: 98.4 F (36.9 C)   Resp: 18     General: AAO x 3, no distress Cardiovascular: RRR, no murmurs  Respiratory: clear to auscultation bilaterally GI: soft, non-tender, non-distended, bowel sound positive  Discharge Instructions You were cared for by a hospitalist during your hospital stay. If you have any questions about your discharge medications or the care you received while you were in the hospital after you are discharged, you can call the unit and asked to speak with the hospitalist on call if the hospitalist that took care of you is not available. Once you are discharged, your primary care physician will handle any further medical issues. Please note that NO REFILLS for any discharge medications will be authorized once you are discharged, as it is imperative that you return to your primary care physician (or establish a relationship with a primary care physician if you do not have one) for your aftercare needs so that they can reassess your need for medications and monitor your lab values.      Discharge Instructions    Discharge instructions    Complete by:  As directed   Low sodium, heart healthy, diabetic diet. If cough and congestion is not getting better, see your doctor next week     Increase activity slowly    Complete by:  As directed  Medication List    TAKE these medications        amLODipine 10 MG tablet  Commonly known as:  NORVASC  Take 10 mg by mouth daily.     aspirin EC 81 MG tablet  Take 81 mg by mouth daily.     Ciclopirox 1 % shampoo  Apply 1 application topically at bedtime. At night after showering     clonazePAM 1 MG tablet  Commonly known as:  KLONOPIN  Take 1 tablet (1 mg total) by mouth at bedtime as needed for anxiety.     dextromethorphan-guaiFENesin 30-600 MG 12hr tablet  Commonly known as:  MUCINEX DM  Take 1 tablet by mouth 2 (two) times daily.     diclofenac sodium 1 % Gel  Commonly known as:   VOLTAREN  Apply 2 g topically 4 (four) times daily.     dicyclomine 10 MG capsule  Commonly known as:  BENTYL  Take 1 capsule (10 mg total) by mouth 3 (three) times daily before meals.     esomeprazole 40 MG capsule  Commonly known as:  NEXIUM  Take 40 mg by mouth daily at 12 noon.     fenofibrate 160 MG tablet  Take 160 mg by mouth daily.     furosemide 40 MG tablet  Commonly known as:  LASIX  Take 1 tablet (40 mg total) by mouth daily.     hydrochlorothiazide 12.5 MG tablet  Commonly known as:  HYDRODIURIL  Take 2 tablets (25 mg total) by mouth daily.     insulin regular human CONCENTRATED 500 UNIT/ML injection  Commonly known as:  HUMULIN R  Inject 0.14-0.18 ml (draw to 14-18 units on the insulin syringe) (70-90 units of insulin) 3x a day, 30 min before meals     Insulin Syringes (Disposable) U-100 1 ML Misc  Use 4x a day     metoprolol 50 MG tablet  Commonly known as:  LOPRESSOR  Take 1 tablet (50 mg total) by mouth 2 (two) times daily.     mometasone-formoterol 100-5 MCG/ACT Aero  Commonly known as:  DULERA  Inhale 2 puffs into the lungs 2 (two) times daily.     nitroGLYCERIN 0.4 MG SL tablet  Commonly known as:  NITROSTAT  Place 1 tablet (0.4 mg total) under the tongue every 5 (five) minutes as needed for chest pain (CP or SOB).     ONE TOUCH ULTRA SYSTEM KIT w/Device Kit  Use to test blood sugar daily as instructed. Dx code: E11.59     oxyCODONE-acetaminophen 5-325 MG tablet  Commonly known as:  PERCOCET/ROXICET  Take 1 tablet by mouth every 12 (twelve) hours as needed. Reported on 10/19/2015     potassium chloride SA 20 MEQ tablet  Commonly known as:  K-DUR,KLOR-CON  Take 1 tablet (20 mEq total) by mouth daily.     prasugrel 10 MG Tabs tablet  Commonly known as:  EFFIENT  Take 1 tablet (10 mg total) by mouth daily.     pravastatin 40 MG tablet  Commonly known as:  PRAVACHOL  Take 1 tablet (40 mg total) by mouth daily.     propranolol 40 MG tablet   Commonly known as:  INDERAL  Take 1 tablet (40 mg total) by mouth 2 (two) times daily.     REFRESH OP  Place 1 drop into both eyes daily as needed (for dry eyes).     VENTOLIN HFA 108 (90 Base) MCG/ACT inhaler  Generic drug:  albuterol  Inhale 2 puffs into the lungs every 6 (six) hours as needed. (SHORTNESS OR BREATH / WHEEZING)       No Known Allergies    The results of significant diagnostics from this hospitalization (including imaging, microbiology, ancillary and laboratory) are listed below for reference.    Significant Diagnostic Studies: Dg Chest 2 View  10/31/2015  CLINICAL DATA:  Chest pain and shortness of breath today. History CHF, hypertension, pneumonia. EXAM: CHEST  2 VIEW COMPARISON:  Chest radiograph June 04, 2015 FINDINGS: The cardiac silhouette appears mildly enlarged, accentuated by low inspiratory technique. Pulmonary vascular congestion. Mildly elevated RIGHT hemidiaphragm without pleural effusion or focal consolidation. No pneumothorax. Soft tissue planes and included osseous structure nonsuspicious. Mild degenerative change of the thoracic spine. Severe degenerative change of the included LEFT shoulder. IMPRESSION: Mild cardiomegaly and pulmonary vascular congestion in this low inspiratory examination. Electronically Signed   By: Elon Alas M.D.   On: 10/31/2015 05:26    Microbiology: Recent Results (from the past 240 hour(s))  MRSA PCR Screening     Status: None   Collection Time: 10/31/15 11:32 AM  Result Value Ref Range Status   MRSA by PCR NEGATIVE NEGATIVE Final    Comment:        The GeneXpert MRSA Assay (FDA approved for NASAL specimens only), is one component of a comprehensive MRSA colonization surveillance program. It is not intended to diagnose MRSA infection nor to guide or monitor treatment for MRSA infections.      Labs: Basic Metabolic Panel:  Recent Labs Lab 10/31/15 0510  NA 146*  K 3.6  CL 107  CO2 30  GLUCOSE  108*  BUN 11  CREATININE 1.22  CALCIUM 9.3   Liver Function Tests: No results for input(s): AST, ALT, ALKPHOS, BILITOT, PROT, ALBUMIN in the last 168 hours. No results for input(s): LIPASE, AMYLASE in the last 168 hours. No results for input(s): AMMONIA in the last 168 hours. CBC:  Recent Labs Lab 10/31/15 0510 11/01/15 0602  WBC 5.9 6.2  HGB 12.0* 11.3*  HCT 37.2* 35.1*  MCV 87.9 87.8  PLT 238 227   Cardiac Enzymes:  Recent Labs Lab 10/31/15 0738 10/31/15 0951 10/31/15 1426  TROPONINI <0.03 0.03 <0.03   BNP: BNP (last 3 results)  Recent Labs  03/21/15 1648 06/04/15 1120 10/31/15 1200  BNP 13.8 35.1 31.1    ProBNP (last 3 results)  Recent Labs  07/17/15 1139 07/24/15 0841  PROBNP 35.0 24.0    CBG:  Recent Labs Lab 10/31/15 1640 10/31/15 1644 10/31/15 2128 11/01/15 0028 11/01/15 0734  GLUCAP 365* 315* 295* 302* 226*       SignedDebbe Odea, MD [facility] 11/01/2015, 11:02 AM

## 2015-11-01 NOTE — Progress Notes (Signed)
CP relieved but pt states L shoulder pain is now a 9/10. VS obtained Morphine 2mg  administered IV as ordered.

## 2015-11-01 NOTE — Progress Notes (Signed)
Patient discharge paperwork gone over with patient in detail as well as his medications. All patients questions answered to patient satisfaction. IV discontinued, telemetry discontinued. Patient discharged to home with brother, by way of wheelchair.

## 2015-11-01 NOTE — Progress Notes (Signed)
Called in to Mr Kolek's room for c/o MSCP radiating to L shoulder. VS obtained.NTG 1/150 administered SL as ordered. EKG obtained

## 2015-11-02 LAB — GLUCOSE, CAPILLARY: Glucose-Capillary: 271 mg/dL — ABNORMAL HIGH (ref 65–99)

## 2015-11-03 ENCOUNTER — Other Ambulatory Visit: Payer: Self-pay

## 2015-11-03 DIAGNOSIS — G4733 Obstructive sleep apnea (adult) (pediatric): Secondary | ICD-10-CM | POA: Diagnosis not present

## 2015-11-03 MED ORDER — FUROSEMIDE 40 MG PO TABS: 40.0000 mg | ORAL_TABLET | Freq: Every day | ORAL | Status: DC

## 2015-11-03 NOTE — Progress Notes (Signed)
Patient Name: Edward, [name] Date: 10/18/2015 Gender: Male D.O.B: Oct 25, 1941 Age (years): 71 Referring Provider: Simonne Maffucci Height (inches): 68 Interpreting Physician: Kara Mead MD, ABSM Weight (lbs): 270 RPSGT: Joni Reining BMI: 41 MRN: [id number] Neck Size: 18.50   CLINICAL INFORMATION Sleep Study Type: NPSG Indication for sleep study: OSA Epworth Sleepiness Score: 9   SLEEP STUDY TECHNIQUE As per the AASM Manual for the Scoring of Sleep and Associated Events v2.3 (April 2016) with a hypopnea requiring 4% desaturations. The channels recorded and monitored were frontal, central and occipital EEG, electrooculogram (EOG), submentalis EMG (chin), nasal and oral airflow, thoracic and abdominal wall motion, anterior tibialis EMG, snore microphone, electrocardiogram, and pulse oximetry.   MEDICATIONS Patient's medications include: N/A. Medications self-administered by patient during sleep study : No sleep medicine administered.   SLEEP ARCHITECTURE The study was initiated at 10:57:31 PM and ended at 5:00:30 AM. Sleep onset time was 5.8 minutes and the sleep efficiency was 74.4%. The total sleep time was 270.0 minutes. Stage REM latency was 162.0 minutes. The patient spent 9.81% of the night in stage N1 sleep, 67.59% in stage N2 sleep, 0.00% in stage N3 and 22.59% in REM. Alpha intrusion was absent. Supine sleep was 100.00%.   RESPIRATORY PARAMETERS The overall apnea/hypopnea index (AHI) was 3.1 per hour. There were 2 total apneas, including 2 obstructive, 0 central and 0 mixed apneas. There were 12 hypopneas and 12 RERAs. The AHI during Stage REM sleep was 13.8 per hour. AHI while supine was 3.1 per hour. The mean oxygen saturation was 96.70%. The minimum SpO2 during sleep was 90.00%. Moderate snoring was noted during this study. CARDIAC DATA The 2 lead EKG demonstrated sinus rhythm. The mean heart rate was 58.70 beats per minute. Other EKG findings include:  PVCs.   LEG MOVEMENT DATA The total PLMS were 0 with a resulting PLMS index of 0.00. Associated arousal with leg movement index was 0.0 .   IMPRESSIONS - No significant obstructive sleep apnea occurred during this study (AHI = 3.1/h).Events were mostly noted during REM sleep - No significant central sleep apnea occurred during this study (CAI = 0.0/h). - The patient had minimal or no oxygen desaturation during the study (Min O2 = 90.00%) - The patient snored with Moderate snoring volume. - EKG findings include PVCs. - Clinically significant periodic limb movements did not occur during sleep. No significant associated arousals.   DIAGNOSIS - No evidence of clinically significant sleep-disordered breathing - Obesity   RECOMMENDATIONS - Avoid alcohol, sedatives and other CNS depressants that may worsen sleep apnea and disrupt normal sleep architecture. - Sleep hygiene should be reviewed to assess factors that may improve sleep quality. - Weight management and regular exercise should be initiated or continued if appropriate.   Kara Mead MD. [place]. [facility]

## 2015-11-03 NOTE — Telephone Encounter (Signed)
Patient was wondering why he was only sent in a 30 day supply of medication when he had been at his office visit the month before. I explained that I was not sure why that would be and assured patient I would send in new refill.  Belva Crome, MD at 08/25/2015 9:39 AM  furosemide (LASIX) 40 MG tablet Take 1 tablet (40 mg total) by mouth daily Patient Instructions     Medication Instructions:  Stop Brilinta.  Start Effient 10mg  daily-Dr Tamala Julian wants you to take a dose today.

## 2015-11-05 ENCOUNTER — Telehealth: Payer: Self-pay

## 2015-11-05 DIAGNOSIS — J849 Interstitial pulmonary disease, unspecified: Secondary | ICD-10-CM

## 2015-11-05 NOTE — Telephone Encounter (Signed)
lmtcb x2 for pt. 

## 2015-11-05 NOTE — Telephone Encounter (Signed)
Patient Returned call [phone]

## 2015-11-05 NOTE — Telephone Encounter (Signed)
A,     Please let him know that this was OK    Thanks    B    lmtcb X1 to relay sleep study results to pt.

## 2015-11-05 NOTE — Telephone Encounter (Signed)
Pt returning call can be reached @ [phone].Edward [name]

## 2015-11-05 NOTE — Telephone Encounter (Signed)
Called spoke with pt. He is aware of results. He had no questions. He also reports he wants [place] to reorder the pulm rehab for him. He never was able to attend prior bc he was out of town.  I have replaced order. Nothing further needed

## 2015-11-06 ENCOUNTER — Other Ambulatory Visit: Payer: Self-pay | Admitting: *Deleted

## 2015-11-06 MED ORDER — GLUCOSE BLOOD VI STRP: ORAL_STRIP | Status: DC

## 2015-11-09 ENCOUNTER — Ambulatory Visit: Payer: Medicare Other | Admitting: Neurology

## 2015-11-19 ENCOUNTER — Ambulatory Visit: Payer: Medicare Other

## 2015-11-19 ENCOUNTER — Telehealth (HOSPITAL_COMMUNITY): Payer: Self-pay

## 2015-11-19 NOTE — Telephone Encounter (Signed)
I have called and left a message with [place] to inquire about participation in Pulmonary Rehab per Dr. Anastasia Pall referral. Requested patient call back to discuss pulmonary rehab orientation.

## 2015-11-23 ENCOUNTER — Telehealth: Payer: Self-pay

## 2015-11-23 NOTE — Telephone Encounter (Signed)
-----   Message from Debbora Dus sent at 11/11/2015 11:26 AM EST ----- Marykay Lex!!  Dr. Rayann Heman sent me a staff message to have this patient see Dr. Tamala Julian in the next few weeks, thinks patient would do better seeing Tamala Julian rather than a APP. Could you please see if you can get this guy in?  Thanks, [employer]

## 2015-11-23 NOTE — Telephone Encounter (Signed)
Pt aware of f/u appt scheduled with Dr.Smith for 2/24 @ 10am. Pt verbalized understanding.

## 2015-11-30 ENCOUNTER — Encounter: Payer: Self-pay | Admitting: Pulmonary Disease

## 2015-11-30 ENCOUNTER — Telehealth: Payer: Self-pay | Admitting: Pulmonary Disease

## 2015-11-30 ENCOUNTER — Ambulatory Visit (INDEPENDENT_AMBULATORY_CARE_PROVIDER_SITE_OTHER): Payer: Medicare Other | Admitting: Pulmonary Disease

## 2015-11-30 VITALS — BP 148/76 | HR 76 | Ht 68.0 in | Wt 288.0 lb

## 2015-11-30 DIAGNOSIS — R0600 Dyspnea, unspecified: Secondary | ICD-10-CM | POA: Diagnosis not present

## 2015-11-30 DIAGNOSIS — R062 Wheezing: Secondary | ICD-10-CM | POA: Diagnosis not present

## 2015-11-30 NOTE — Telephone Encounter (Signed)
Patient said that [facility] inhaler is too expensive, $377.00; advised patient to contact his insurance company to see what his insurance will cover that is an alternative to [place].  Patient says he will contact his insurance company and call us back tomorrow with that information.  Awaiting call back from patient.

## 2015-11-30 NOTE — Patient Instructions (Signed)
Follow the acid reflux diet we provided you Take Prilosec 20 mg in the morning Take Pepcid in the afternoon Don't take Nexium on days that she take the Prilosec Elevate the head of her bed Try hard to lose weight We will see you back in 4 months or sooner if needed

## 2015-11-30 NOTE — Progress Notes (Signed)
Subjective:    Patient ID: Edward [name], male    DOB: May 22, 1941, 75 y.o.   MRN: [id number]  Synopsis: Referred in 2016 for dyspnea, has a history of asthma.  Never smoked. He has significant acid reflux. He also has coronary artery disease. 1/15 Hgb 12.10 July 2015 pulmonary function testing ratio 82%, FEV1 2.10 L (81% predicted), total lung capacity 4.21 L (63% predicted), DLCO 21.17 (71% predicted)  08/2015 HRCT> mild GGO, likley pulmonary edema and less likely NSIP; 3 vessel CAD noted, mild likley reactive lymphadenopathy Pt diagnosed with osa years ago up [place] and had a cpap but stopped using it He is requesting a referral because he feels he may need the cpap  HPI Chief Complaint  Patient presents with  . Follow-up    pt c/o worsening wheezing, shortness of breath.  states this is worse with exertion, speaking, and bending over.     Edward [name] was in the hospital recently.  He reports that his breathing has been worsening walking to the mailbox.  He has been wheezing more and diaphoretic.  He is taking albuterol which helps, but only temporarily.  He has gained 20 pounds which he thinks is mainly fluid.  He has been taking a new insulin that is concentrated.  Apparently his A1c has improved but he has noticed a weight gain during this time.  He has been coughing up clear mucus, no fevers, some chest tightness from time to time.    Past Medical History  Diagnosis Date  . Hypertension   . CAD (coronary artery disease)     a. Pt reports "small heart attack" in 2013 at [facility], denies need for stent or CABG, does not know findings of cath.  [facility] Kitchen TIA (transient ischemic attack) 2002  . Dyslipidemia, goal LDL below 70 01/29/2014  . GERD (gastroesophageal reflux disease)   . Enlarged prostate   . Shortness of breath     exertion  . DM type 2 (diabetes mellitus, type 2), insulin dependent 01/29/2014    fasting cbg 50-120 with new regimen  . Anxiety   . Heart attack  ([place]) 02/03/2014    "mild heart attack"  . Pneumonia 01/2013  . Pneumonia 03/2014    hospitalized  . Sleep apnea     does not use cpap (06/04/2015)  . YQMGNOIB(704.8)     "monthly" (06/04/2015)  . Arthritis     'all over"  . Tremor of both hands   . Adenomatous colon polyp 04/2011  . Diverticulosis   . Internal hemorrhoid       Review of Systems  Constitutional: Positive for fatigue. Negative for fever and chills.  HENT: Negative for postnasal drip, rhinorrhea and sinus pressure.   Respiratory: Positive for shortness of breath. Negative for cough and wheezing.   Cardiovascular: Positive for leg swelling. Negative for chest pain and palpitations.       Objective:   Physical Exam Filed Vitals:   11/30/15 1407  BP: 148/76  Pulse: 76  Height: 5' 8" (1.727 m)  Weight: 288 lb (130.636 kg)  SpO2: 98%   RA  Related 500 feet in office O2 sat duration remained above 97%  Gen: chronically ill appearing HENT: OP clear, TM's clear, neck supple PULM: CTA B, normal percussion CV: RRR, no mgr, trace edema GI: BS+, soft, nontender Derm: no cyanosis or rash Psyche: normal mood and affect  Records from Dr. Fuller [name] reviewed where he was treated for his GERD PFT and CT images  reviewed     Assessment & [name]:  Dyspnea This is due primarily to his obesity and deconditioning.  I believe that his wheezing is due primarily to vocal cord irritation and inflammation causing upper airway wheezing.    His CT chest and PFTs in the fall of 2016 raise concern for possible interstitial lung disease but I think this is very unlikely. I believe that most of his symptoms are due to obesity and deconditioning.  [name]: Weight loss was strongly encouraged today. He will continue to participate in pulmonary rehabilitation To evaluate for the very unlikely interstitial lung disease we will repeat a lung function test on the next visit Follow-up 3-4 months  Wheezing He does not have significant airflow  obstruction. He has been labeled as having wheezing but I don't think this is correct. He recently started on Dulera which was prescribed in the hospital. While he may have mild asthma, I believe that his wheezing is really just due to acid reflux causing laryngeal irritation.  [name]: Acid reflux lifestyle modification encouraged today Use Prilosec in the morning, not Nexium Use Pepcid at night Elevate head of bed    > 50% of time spent in a 27 minute visit was face to face  Current outpatient prescriptions:  .  amLODipine (NORVASC) 10 MG tablet, Take 10 mg by mouth daily., Disp: , Rfl: 1 .  aspirin EC 81 MG tablet, Take 81 mg by mouth daily., Disp: , Rfl:  .  Blood Glucose Monitoring Suppl (ONE TOUCH ULTRA SYSTEM KIT) W/DEVICE KIT, Use to test blood sugar daily as instructed. Dx code: E11.59, Disp: 1 each, Rfl: 0 .  Ciclopirox 1 % shampoo, Apply 1 application topically at bedtime. At night after showering, Disp: , Rfl: 0 .  clonazePAM (KLONOPIN) 1 MG tablet, Take 1 tablet (1 mg total) by mouth at bedtime as needed for anxiety., Disp: 30 tablet, Rfl: 2 .  dextromethorphan-guaiFENesin (MUCINEX DM) 30-600 MG 12hr tablet, Take 1 tablet by mouth 2 (two) times daily., Disp: 30 tablet, Rfl: 0 .  diclofenac sodium (VOLTAREN) 1 % GEL, Apply 2 g topically 4 (four) times daily., Disp: 100 g, Rfl: 0 .  dicyclomine (BENTYL) 10 MG capsule, Take 1 capsule (10 mg total) by mouth 3 (three) times daily before meals., Disp: 90 capsule, Rfl: 5 .  esomeprazole (NEXIUM) 40 MG capsule, Take 40 mg by mouth daily at 12 noon., Disp: , Rfl:  .  fenofibrate 160 MG tablet, Take 160 mg by mouth daily., Disp: , Rfl: 0 .  furosemide (LASIX) 40 MG tablet, Take 1 tablet (40 mg total) by mouth daily., Disp: 90 tablet, Rfl: 2 .  glucose blood test strip, Use to test blood sugar 4 times daily instructed. Dx: E11.59, Disp: 125 each, Rfl: 5 .  hydrochlorothiazide (HYDRODIURIL) 12.5 MG tablet, Take 2 tablets (25 mg total) by mouth  daily., Disp: 30 tablet, Rfl: 11 .  insulin regular human CONCENTRATED (HUMULIN R) 500 UNIT/ML injection, Inject 0.14-0.18 ml (draw to 14-18 units on the insulin syringe) (70-90 units of insulin) 3x a day, 30 min before meals, Disp: 80 mL, Rfl: 1 .  Insulin Syringes, Disposable, U-100 1 ML MISC, Use 4x a day, Disp: 200 each, Rfl: 11 .  isosorbide mononitrate (IMDUR) 30 MG 24 hr tablet, Take 1 tablet (30 mg total) by mouth daily., Disp: 30 tablet, Rfl: 0 .  metoprolol (LOPRESSOR) 50 MG tablet, Take 1 tablet (50 mg total) by mouth 2 (two) times daily., Disp: 60 tablet,  Rfl: 6 .  mometasone-formoterol (DULERA) 100-5 MCG/ACT AERO, Inhale 2 puffs into the lungs 2 (two) times daily., Disp: 1 Inhaler, Rfl: 0 .  nitroGLYCERIN (NITROSTAT) 0.4 MG SL tablet, Place 1 tablet (0.4 mg total) under the tongue every 5 (five) minutes as needed for chest pain (CP or SOB)., Disp: 25 tablet, Rfl: 4 .  oxyCODONE-acetaminophen (PERCOCET/ROXICET) 5-325 MG tablet, Take 1 tablet by mouth every 12 (twelve) hours as needed. Reported on 10/19/2015, Disp: , Rfl: 0 .  Polyvinyl Alcohol-Povidone (REFRESH OP), Place 1 drop into both eyes daily as needed (for dry eyes)., Disp: , Rfl:  .  potassium chloride SA (K-DUR,KLOR-CON) 20 MEQ tablet, Take 1 tablet (20 mEq total) by mouth daily., Disp: 30 tablet, Rfl: 11 .  prasugrel (EFFIENT) 10 MG TABS tablet, Take 1 tablet (10 mg total) by mouth daily., Disp: 30 tablet, Rfl: 6 .  pravastatin (PRAVACHOL) 40 MG tablet, Take 1 tablet (40 mg total) by mouth daily., Disp: 30 tablet, Rfl: 5 .  propranolol (INDERAL) 40 MG tablet, Take 1 tablet (40 mg total) by mouth 2 (two) times daily., Disp: 60 tablet, Rfl: 11 .  VENTOLIN HFA 108 (90 BASE) MCG/ACT inhaler, Inhale 2 puffs into the lungs every 6 (six) hours as needed. (SHORTNESS OR BREATH / WHEEZING), Disp: , Rfl: 0

## 2015-11-30 NOTE — Assessment & Plan Note (Addendum)
This is due primarily to his obesity and deconditioning.  I believe that his wheezing is due primarily to vocal cord irritation and inflammation causing upper airway wheezing.    His CT chest and PFTs in the fall of 2016 raise concern for possible interstitial lung disease but I think this is very unlikely. I believe that most of his symptoms are due to obesity and deconditioning.  Plan: Weight loss was strongly encouraged today. He will continue to participate in pulmonary rehabilitation To evaluate for the very unlikely interstitial lung disease we will repeat a lung function test on the next visit Follow-up 3-4 months

## 2015-11-30 NOTE — Assessment & Plan Note (Signed)
He does not have significant airflow obstruction. He has been labeled as having wheezing but I don't think this is correct. He recently started on Dulera which was prescribed in the hospital. While he may have mild asthma, I believe that his wheezing is really just due to acid reflux causing laryngeal irritation.  Plan: Acid reflux lifestyle modification encouraged today Use Prilosec in the morning, not Nexium Use Pepcid at night Elevate head of bed

## 2015-12-01 ENCOUNTER — Telehealth: Payer: Self-pay | Admitting: Pulmonary Disease

## 2015-12-01 NOTE — Telephone Encounter (Signed)
Covered alternatives for Dulera are Breo and anoro.  BQ please advise on recs.  Thanks!

## 2015-12-01 NOTE — Telephone Encounter (Signed)
Pt calling to give the manes of meds that ins will pay for breo ellepta and anero call to [facility] on [address].[name]

## 2015-12-01 NOTE — Telephone Encounter (Signed)
Error duplicate message.Edward [name]

## 2015-12-02 MED ORDER — FLUTICASONE FUROATE-VILANTEROL 100-25 MCG/INH IN AEPB: 1.0000 | INHALATION_SPRAY | Freq: Every day | RESPIRATORY_TRACT | Status: DC

## 2015-12-02 NOTE — Telephone Encounter (Signed)
Breo 100 dose 1 puff daily

## 2015-12-02 NOTE — Telephone Encounter (Signed)
Spoke with pt. He is aware of BQ's response. Rx has been sent in. Nothing further was needed.

## 2015-12-07 ENCOUNTER — Encounter (HOSPITAL_COMMUNITY): Payer: Self-pay

## 2015-12-07 ENCOUNTER — Encounter (HOSPITAL_COMMUNITY)
Admission: RE | Admit: 2015-12-07 | Discharge: 2015-12-07 | Disposition: A | Discharge status: Home / Self Care | Payer: Medicare Other | Source: Ambulatory Visit | Attending: Pulmonary Disease | Admitting: Pulmonary Disease

## 2015-12-07 VITALS — BP 133/44 | HR 65 | Resp 18 | Ht 67.25 in | Wt 288.8 lb

## 2015-12-07 DIAGNOSIS — J849 Interstitial pulmonary disease, unspecified: Secondary | ICD-10-CM | POA: Insufficient documentation

## 2015-12-07 LAB — GLUCOSE, CAPILLARY: GLUCOSE-CAPILLARY: 271 mg/dL — AB (ref 65–99)

## 2015-12-07 NOTE — Progress Notes (Signed)
Edward [name] 75 y.o. male Pulmonary Rehab Orientation Note Patient arrived today in Cardiac and Pulmonary Rehab for orientation to Pulmonary Rehab. He was transported from [employer] via wheel chair. He has not been prescribed oxygen for home or portable use. Color good, skin warm and dry. Patient is oriented to time and place. Patient's medical history, psychosocial health, and medications reviewed. Psychosocial assessment reveals pt lives alone. He moved from [place] 2 years ago to live closer to his brother. He is currently separated. He has not children as he married late in life when he was 87. Pt is currently retired. Pt hobbies include reading and photography. Pt reports his stress level is moderate, but getting better. Areas of stress/anxiety include Health, finances, and family relations.  Pt does exhibits signs of depression. Signs of depression include anxiety and overeating.  PHQ2/9 score 2/7. Patient feels he does not need counceling at this time. He has meaningful conversations with his pastor, doctors and friends that is helping his anxiety and depression symptoms. Pt shows good  coping skills with positive outlook . He was offered emotional support and reassurance. Will continue to monitor and evaluate progress toward psychosocial goal(s) of managing his anxiety while utilizing his faith as support.. Physical assessment reveals heart rate is normal, breath sounds clear to auscultation, no wheezes, rales, or rhonchi. Grip strength equal, strong. Distal pulses palpable. 2+ pitting edema noted to lower legs, ankles, and feet. Patient reports he does take medications as prescribed. Patient states he follows a Regular diet. He is trying to focus more on managing his diabetes and following a diabetic diet as well as he recognized the need to begin following a cardiac diet and low sodium to manage the lower extremity edema. The patient reports no specific efforts to gain or lose weight.. Patient's weight  will be monitored closely. Demonstration and practice of PLB using pulse oximeter. Patient able to return demonstration satisfactorily. Safety and hand hygiene in the exercise area reviewed with patient. Patient voices understanding of the information reviewed. Department expectations discussed with patient and achievable goals were set. The patient shows enthusiasm about attending the program and we look forward to working with this nice gentleman. The patient is scheduled for a 6 min walk test on Thursday 2/9 at 3:45  and to begin exercise on Thursday 2/23 in the 10:30 class.   45 minutes was spent on a variety of activities such as assessment of the patient, obtaining baseline data including height, weight, BMI, and grip strength, verifying medical history, allergies, and current medications, and teaching patient strategies for performing tasks with less respiratory effort with emphasis on pursed lip breathing.

## 2015-12-08 ENCOUNTER — Ambulatory Visit (INDEPENDENT_AMBULATORY_CARE_PROVIDER_SITE_OTHER): Payer: Medicare Other

## 2015-12-08 VITALS — BP 135/78 | HR 72 | Ht 67.0 in | Wt 289.2 lb

## 2015-12-08 DIAGNOSIS — Z0000 Encounter for general adult medical examination without abnormal findings: Secondary | ICD-10-CM | POA: Diagnosis not present

## 2015-12-08 NOTE — Patient Instructions (Addendum)
Continue to eat heart healthy diet (full of fruits, vegetables, whole grains, lean protein, water--limit salt, fat, and sugar intake) and increase physical activity as tolerated.  Continue doing brain stimulating activities (puzzles, reading, adult coloring books, staying active) to keep memory sharp.   Colonoscopy by the end of the year.    Follow up with cardiologist and pulmonologist as scheduled  Great job on signing up for the nutrition class.  Complete Cardiac Rehab.    Follow up with Dr. Etter Sjogren by the end of this month.   Colonoscopy A colonoscopy is an exam to look at the entire large intestine (colon). This exam can help find problems such as tumors, polyps, inflammation, and areas of bleeding. The exam takes about 1 hour.  LET [facility] CARE PROVIDER KNOW ABOUT:   Any allergies you have.  All medicines you are taking, including vitamins, herbs, eye drops, creams, and over-the-counter medicines.  Previous problems you or members of your family have had with the use of anesthetics.  Any blood disorders you have.  Previous surgeries you have had.  Medical conditions you have. RISKS AND COMPLICATIONS  Generally, this is a safe procedure. However, as with any procedure, complications can occur. Possible complications include:  Bleeding.  Tearing or rupture of the colon wall.  Reaction to medicines given during the exam.  Infection (rare). BEFORE THE PROCEDURE   Ask your health care provider about changing or stopping your regular medicines.  You may be prescribed an oral bowel prep. This involves drinking a large amount of medicated liquid, starting the day before your procedure. The liquid will cause you to have multiple loose stools until your stool is almost clear or light green. This cleans out your colon in preparation for the procedure.  Do not eat or drink anything else once you have started the bowel prep, unless your health care provider tells you it is  safe to do so.  Arrange for someone to drive you home after the procedure. PROCEDURE   You will be given medicine to help you relax (sedative).  You will lie on your side with your knees bent.  A long, flexible tube with a light and camera on the end (colonoscope) will be inserted through the rectum and into the colon. The camera sends video back to a computer screen as it moves through the colon. The colonoscope also releases carbon dioxide gas to inflate the colon. This helps your health care provider see the area better.  During the exam, your health care provider may take a small tissue sample (biopsy) to be examined under a microscope if any abnormalities are found.  The exam is finished when the entire colon has been viewed. AFTER THE PROCEDURE   Do not drive for 24 hours after the exam.  You may have a small amount of blood in your stool.  You may pass moderate amounts of gas and have mild abdominal cramping or bloating. This is caused by the gas used to inflate your colon during the exam.  Ask when your test results will be ready and how you will get your results. Make sure you get your test results.   This information is not intended to replace advice given to you by your health care provider. Make sure you discuss any questions you have with your health care provider.   Document Released: 10/14/2000 Document Revised: 08/07/2013 Document Reviewed: 06/24/2013 Elsevier Interactive Patient Education 2016 [place].  Diabetes and Foot Care Diabetes may cause you  to have problems because of poor blood supply (circulation) to your feet and legs. This may cause the skin on your feet to become thinner, break easier, and heal more slowly. Your skin may become dry, and the skin may peel and crack. You may also have nerve damage in your legs and feet causing decreased feeling in them. You may not notice minor injuries to your feet that could lead to infections or more serious  problems. Taking care of your feet is one of the most important things you can do for yourself.  HOME CARE INSTRUCTIONS  Wear shoes at all times, even in the house. Do not go barefoot. Bare feet are easily injured.  Check your feet daily for blisters, cuts, and redness. If you cannot see the bottom of your feet, use a mirror or ask someone for help.  Wash your feet with warm water (do not use hot water) and mild soap. Then pat your feet and the areas between your toes until they are completely dry. Do not soak your feet as this can dry your skin.  Apply a moisturizing lotion or petroleum jelly (that does not contain alcohol and is unscented) to the skin on your feet and to dry, brittle toenails. Do not apply lotion between your toes.  Trim your toenails straight across. Do not dig under them or around the cuticle. File the edges of your nails with an emery board or nail file.  Do not cut corns or calluses or try to remove them with medicine.  Wear clean socks or stockings every day. Make sure they are not too tight. Do not wear knee-high stockings since they may decrease blood flow to your legs.  Wear shoes that fit properly and have enough cushioning. To break in new shoes, wear them for just a few hours a day. This prevents you from injuring your feet. Always look in your shoes before you put them on to be sure there are no objects inside.  Do not cross your legs. This may decrease the blood flow to your feet.  If you find a minor scrape, cut, or break in the skin on your feet, keep it and the skin around it clean and dry. These areas may be cleansed with mild soap and water. Do not cleanse the area with peroxide, alcohol, or iodine.  When you remove an adhesive bandage, be sure not to damage the skin around it.  If you have a wound, look at it several times a day to make sure it is healing.  Do not use heating pads or hot water bottles. They may burn your skin. If you have lost feeling  in your feet or legs, you may not know it is happening until it is too late.  Make sure your health care provider performs a complete foot exam at least annually or more often if you have foot problems. Report any cuts, sores, or bruises to your health care provider immediately. SEEK MEDICAL CARE IF:   You have an injury that is not healing.  You have cuts or breaks in the skin.  You have an ingrown nail.  You notice redness on your legs or feet.  You feel burning or tingling in your legs or feet.  You have pain or cramps in your legs and feet.  Your legs or feet are numb.  Your feet always feel cold. SEEK IMMEDIATE MEDICAL CARE IF:   There is increasing redness, swelling, or pain in or around a  wound.  There is a red line that goes up your leg.  Pus is coming from a wound.  You develop a fever or as directed by your health care provider.  You notice a bad smell coming from an ulcer or wound.   This information is not intended to replace advice given to you by your health care provider. Make sure you discuss any questions you have with your health care provider.   Document Released: 10/14/2000 Document Revised: 06/19/2013 Document Reviewed: 03/26/2013 Elsevier Interactive Patient Education 2016 [place] in the Home  Falls can cause injuries. They can happen to people of all ages. There are many things you can do to make your home safe and to help prevent falls.  WHAT CAN I DO ON THE OUTSIDE OF MY HOME?  Regularly fix the edges of walkways and driveways and fix any cracks.  Remove anything that might make you trip as you walk through a door, such as a raised step or threshold.  Trim any bushes or trees on the path to your home.  Use bright outdoor lighting.  Clear any walking paths of anything that might make someone trip, such as rocks or tools.  Regularly check to see if handrails are loose or broken. Make sure that both sides of any steps  have handrails.  Any raised decks and porches should have guardrails on the edges.  Have any leaves, snow, or ice cleared regularly.  Use sand or salt on walking paths during winter.  Clean up any spills in your garage right away. This includes oil or grease spills. WHAT CAN I DO IN THE BATHROOM?   Use night lights.  Install grab bars by the toilet and in the tub and shower. Do not use towel bars as grab bars.  Use non-skid mats or decals in the tub or shower.  If you need to sit down in the shower, use a plastic, non-slip stool.  Keep the floor dry. Clean up any water that spills on the floor as soon as it happens.  Remove soap buildup in the tub or shower regularly.  Attach bath mats securely with double-sided non-slip rug tape.  Do not have throw rugs and other things on the floor that can make you trip. WHAT CAN I DO IN THE BEDROOM?  Use night lights.  Make sure that you have a light by your bed that is easy to reach.  Do not use any sheets or blankets that are too big for your bed. They should not hang down onto the floor.  Have a firm chair that has side arms. You can use this for support while you get dressed.  Do not have throw rugs and other things on the floor that can make you trip. WHAT CAN I DO IN THE KITCHEN?  Clean up any spills right away.  Avoid walking on wet floors.  Keep items that you use a lot in easy-to-reach places.  If you need to reach something above you, use a strong step stool that has a grab bar.  Keep electrical cords out of the way.  Do not use floor polish or wax that makes floors slippery. If you must use wax, use non-skid floor wax.  Do not have throw rugs and other things on the floor that can make you trip. WHAT CAN I DO WITH MY STAIRS?  Do not leave any items on the stairs.  Make sure that there are handrails on both sides  of the stairs and use them. Fix handrails that are broken or loose. Make sure that handrails are as  long as the stairways.  Check any carpeting to make sure that it is firmly attached to the stairs. Fix any carpet that is loose or worn.  Avoid having throw rugs at the top or bottom of the stairs. If you do have throw rugs, attach them to the floor with carpet tape.  Make sure that you have a light switch at the top of the stairs and the bottom of the stairs. If you do not have them, ask someone to add them for you. WHAT ELSE CAN I DO TO HELP PREVENT FALLS?  Wear shoes that:  Do not have high heels.  Have rubber bottoms.  Are comfortable and fit you well.  Are closed at the toe. Do not wear sandals.  If you use a stepladder:  Make sure that it is fully opened. Do not climb a closed stepladder.  Make sure that both sides of the stepladder are locked into place.  Ask someone to hold it for you, if possible.  Clearly mark and make sure that you can see:  Any grab bars or handrails.  First and last steps.  Where the edge of each step is.  Use tools that help you move around (mobility aids) if they are needed. These include:  Canes.  Walkers.  Scooters.  Crutches.  Turn on the lights when you go into a dark area. Replace any light bulbs as soon as they burn out.  Set up your furniture so you have a clear path. Avoid moving your furniture around.  If any of your floors are uneven, fix them.  If there are any pets around you, be aware of where they are.  Review your medicines with your doctor. Some medicines can make you feel dizzy. This can increase your chance of falling. Ask your doctor what other things that you can do to help prevent falls.   This information is not intended to replace advice given to you by your health care provider. Make sure you discuss any questions you have with your health care provider.   Document Released: 08/13/2009 Document Revised: 03/03/2015 Document Reviewed: 11/21/2014 Elsevier Interactive Patient Education [employer].

## 2015-12-08 NOTE — Progress Notes (Signed)
Pre visit review using our clinic review tool, if applicable. No additional management support is needed unless otherwise documented below in the visit note. 

## 2015-12-08 NOTE — Progress Notes (Signed)
Subjective:   Edward [name] is a 75 y.o. male who presents for an Initial Medicare Annual Wellness Visit.  Review of Systems:  No ROS Cardiac Risk Factors include: advanced age (>44mn, >>15women);diabetes mellitus;hypertension;male gender;obesity (BMI >30kg/m2);sedentary lifestyle  Sleep patterns: sleeps at least 3-4 hours at night Home Safety/Smoke Alarms: feels safe at home; smoke alarms and security system present Firearm Safety: None present in the home Seat Belt Safety/Bike Helmet: Always wears seatbelt  Counseling:   Eye Exam- 4 or 5 months ago Dental- hasn't been within the last year; has an an appointment scheduled 01/04/16 Male:  [facility]- 12/29/10; repeat in 3 years/ Dr. STheotis Barrio    PSA- 02/11/14 0.74    Objective:    Today's Vitals   12/08/15 1050  BP: 135/78  Pulse: 72  Height: '5\' 7"'  (1.702 m)  Weight: 289 lb 3.2 oz (131.18 kg)  SpO2: 98%  PainSc: 0-No pain    Current Medications (verified) Outpatient Encounter Prescriptions as of 12/08/2015  Medication Sig  . amLODipine (NORVASC) 10 MG tablet Take 10 mg by mouth daily.  .Marland Kitchenaspirin EC 81 MG tablet Take 81 mg by mouth daily.  . Blood Glucose Monitoring Suppl (ONE TOUCH ULTRA SYSTEM KIT) W/DEVICE KIT Use to test blood sugar daily as instructed. Dx code: E11.59  . Ciclopirox 1 % shampoo Apply 1 application topically at bedtime. At night after showering  . clonazePAM (KLONOPIN) 1 MG tablet Take 1 tablet (1 mg total) by mouth at bedtime as needed for anxiety.  .Marland Kitchendextromethorphan-guaiFENesin (MUCINEX DM) 30-600 MG 12hr tablet Take 1 tablet by mouth 2 (two) times daily.  .Marland Kitchendicyclomine (BENTYL) 10 MG capsule Take 1 capsule (10 mg total) by mouth 3 (three) times daily before meals.  .Marland Kitchenesomeprazole (NEXIUM) 40 MG capsule Take 40 mg by mouth daily at 12 noon.  . fenofibrate 160 MG tablet Take 160 mg by mouth daily.  . Fluticasone Furoate-Vilanterol (BREO ELLIPTA) 100-25 MCG/INH AEPB Inhale 1 puff into the lungs daily.  .  furosemide (LASIX) 40 MG tablet Take 1 tablet (40 mg total) by mouth daily.  .Marland Kitchenglucose blood test strip Use to test blood sugar 4 times daily instructed. Dx: E11.59  . insulin regular human CONCENTRATED (HUMULIN R) 500 UNIT/ML injection Inject 0.14-0.18 ml (draw to 14-18 units on the insulin syringe) (70-90 units of insulin) 3x a day, 30 min before meals  . Insulin Syringes, Disposable, U-100 1 ML MISC Use 4x a day  . isosorbide mononitrate (IMDUR) 30 MG 24 hr tablet Take 1 tablet (30 mg total) by mouth daily.  . metoprolol (LOPRESSOR) 50 MG tablet Take 1 tablet (50 mg total) by mouth 2 (two) times daily.  . nitroGLYCERIN (NITROSTAT) 0.4 MG SL tablet Place 1 tablet (0.4 mg total) under the tongue every 5 (five) minutes as needed for chest pain (CP or SOB).  . Polyvinyl Alcohol-Povidone (REFRESH OP) Place 1 drop into both eyes daily as needed (for dry eyes).  . potassium chloride SA (K-DUR,KLOR-CON) 20 MEQ tablet Take 1 tablet (20 mEq total) by mouth daily.  . prasugrel (EFFIENT) 10 MG TABS tablet Take 1 tablet (10 mg total) by mouth daily.  . pravastatin (PRAVACHOL) 40 MG tablet Take 1 tablet (40 mg total) by mouth daily.  . propranolol (INDERAL) 40 MG tablet Take 1 tablet (40 mg total) by mouth 2 (two) times daily.  . VENTOLIN HFA 108 (90 BASE) MCG/ACT inhaler Inhale 2 puffs into the lungs every 6 (six) hours as  needed. (SHORTNESS OR BREATH / WHEEZING)  . diclofenac sodium (VOLTAREN) 1 % GEL Apply 2 g topically 4 (four) times daily. (Patient not taking: Reported on 12/08/2015)  . [DISCONTINUED] hydrochlorothiazide (HYDRODIURIL) 12.5 MG tablet Take 2 tablets (25 mg total) by mouth daily. (Patient not taking: Reported on 12/07/2015)  . [DISCONTINUED] mometasone-formoterol (DULERA) 100-5 MCG/ACT AERO Inhale 2 puffs into the lungs 2 (two) times daily.  . [DISCONTINUED] oxyCODONE-acetaminophen (PERCOCET/ROXICET) 5-325 MG tablet Take 1 tablet by mouth every 12 (twelve) hours as needed. Reported on 10/19/2015     No facility-administered encounter medications on file as of 12/08/2015.    Allergies (verified) Review of patient's allergies indicates no known allergies.   History: Past Medical History  Diagnosis Date  . Hypertension   . CAD (coronary artery disease)     a. Pt reports "small heart attack" in 2013 at [facility], denies need for stent or CABG, does not know findings of cath.  Marland Kitchen TIA (transient ischemic attack) 2002  . Dyslipidemia, goal LDL below 70 01/29/2014  . GERD (gastroesophageal reflux disease)   . Enlarged prostate   . Shortness of breath     exertion  . DM type 2 (diabetes mellitus, type 2), insulin dependent 01/29/2014    fasting cbg 50-120 with new regimen  . Anxiety   . Heart attack ([place]) 02/03/2014    "mild heart attack"  . Pneumonia 01/2013  . Pneumonia 03/2014    hospitalized  . Sleep apnea     does not use cpap (06/04/2015)  . MBWGYKZL(935.7)     "monthly" (06/04/2015)  . Arthritis     'all over"  . Tremor of both hands   . Adenomatous colon polyp 04/2011  . Diverticulosis   . Internal hemorrhoid    Past Surgical History  Procedure Laterality Date  . Total knee arthroplasty Bilateral   . Lumbar disc surgery    . Cataract extraction Bilateral   . Joint replacement    . Back surgery    . Cataract extraction, bilateral Bilateral   . Shoulder arthroscopy Right 07/30/2014    Procedure: Right Shoulder Arthroscopy, Debridement, Decompression, Manipulation Under Anesthesia;  Surgeon: Newt Minion, MD;  Location: [place];  Service: Orthopedics;  Laterality: Right;  . Left heart catheterization with coronary angiogram N/A 01/28/2014    Procedure: LEFT HEART CATHETERIZATION WITH CORONARY ANGIOGRAM;  Surgeon: Sinclair Grooms, MD;  Location[facility] C[facility] LAB;  Service: Cardiovascular;  Laterality: N/A;  . Left heart catheterization with coronary angiogram N/A 01/30/2015    Procedure: LEFT HEART CATHETERIZATION WITH CORONARY ANGIOGRAM;  Surgeon: Belva Crome, MD;   Location: [facility] CATH LAB;  Service: Cardiovascular;  Laterality: N/A;  . Cardiac catheterization  01/28/14    + CAD treat medically  . Coronary angioplasty with stent placement  01/30/2015    DES Promus  [place] to LAD by Dr Tamala Julian   Family History  Problem Relation Age of Onset  . CAD Brother     2 brothers - CABG  . Alzheimer's disease Sister   . CAD Sister   . Prostate cancer Brother   . Diabetes Other     entire family  . Asthma Brother     2 brothers    Social History   Occupational History  . retired    Social History Main Topics  . Smoking status: Never Smoker   . Smokeless tobacco: Never Used  . Alcohol Use: No  . Drug Use: No  . Sexual Activity:  No   Tobacco Counseling Counseling given: No   Activities of Daily Living In your present state of health, do you have any difficulty performing the following activities: 12/08/2015 12/07/2015  Hearing? N N  Vision? N Y  Difficulty concentrating or making decisions? N Y  Walking or climbing stairs? Y Y  Dressing or bathing? N N  Doing errands, shopping? N N  Preparing Food and eating ? N -  Using the Toilet? N -  In the past six months, have you accidently leaked urine? Y -  Do you have problems with loss of bowel control? N -  Managing your Medications? N -  Managing your Finances? N -  Housekeeping or managing your Housekeeping? N -    Immunizations and Health Maintenance Immunization History  Administered Date(s) Administered  . Influenza-Unspecified 07/31/2013  . Pneumococcal-Unspecified 03/13/2013  . Td 01/01/2008   Health Maintenance Due  Topic Date Due  . ZOSTAVAX  05/24/2002  . COLONOSCOPY  12/28/2013  . PNA vac Low Risk Adult (2 of 2 - PCV13) 03/13/2014  . URINE MICROALBUMIN  02/12/2015  . FOOT EXAM  11/18/2015  . OPHTHALMOLOGY EXAM  11/20/2015    Patient Care Team: Rosalita Chessman, DO as PCP - General (Family Medicine) Philemon Kingdom, MD as Consulting Physician (Internal Medicine) Alda Berthold, DO as Consulting Physician (Neurology) Juanito Doom, MD as Consulting Physician (Pulmonary Disease) Ladene Artist, MD as Consulting Physician (Gastroenterology)  Indicate any recent Medical Services you may have received from other than [facility] providers in the past year (date may be approximate).    Assessment:   This is a routine wellness examination for Edison.  Physical assessment deferred to PCP.     Hearing/Vision screen  Hearing Screening   '125Hz'  '250Hz'  '500Hz'  '1000Hz'  '2000Hz'  '4000Hz'  '8000Hz'   Right ear:   Pass Pass Pass Fail   Left ear:   Pass Pass Pass Fail   Vision Screening Comments: Last exam 4-5 months ago w/ Dr. Zigmund Daniel  Dietary issues and exercise activities discussed: Current Exercise Habits:: The patient has a physically strenous job, but has no regular exercise apart from work. (scheduled to start cardiac rehab; 12/24/15);   Diet:  Described as "horrible."  Currently working on improving diet.  Has signed up for nutrition class.  Has reduced salt intake.  Working on improving simple carb intake.     Goals    . Eat healthier     Reduce simple sugar and limit salt intake.      . Increase physical activity     Patient schedule to start cardiac rehab 12/24/15    . lose 20-25 lbs by the end of this year      Depression Screen PHQ 2/9 Scores 12/08/2015 12/07/2015 06/04/2015 02/11/2014  PHQ - 2 Score 0 2 0 0  PHQ- 9 Score - 7 - -    Fall Risk Fall Risk  12/08/2015 12/07/2015 06/04/2015 02/11/2014  Falls in the past year? Yes Yes No No  Number falls in past yr: 1 2 or more - -  Injury with Fall? No - - -  Risk Factor Category  High Fall Risk High Fall Risk - -  Risk for fall due to : Medication side effect;Impaired vision History of fall(s);Impaired vision - -  Follow up Education provided;Falls prevention discussed - - -    Cognitive Function: MMSE - Mini Mental State Exam 12/08/2015  Orientation to time 5  Orientation to Place 5  Registration 3  Attention/  Calculation 4  Recall 2  Language- name 2 objects 2  Language- repeat 1  Language- follow 3 step command 3  Language- read & follow direction 1  Write a sentence 1  Copy design 1  Total score 28    Screening Tests Health Maintenance  Topic Date Due  . ZOSTAVAX  05/24/2002  . COLONOSCOPY  12/28/2013  . PNA vac Low Risk Adult (2 of 2 - PCV13) 03/13/2014  . URINE MICROALBUMIN  02/12/2015  . FOOT EXAM  11/18/2015  . OPHTHALMOLOGY EXAM  11/20/2015  . INFLUENZA VACCINE  08/19/2016 (Originally 06/01/2015)  . HEMOGLOBIN A1C  04/18/2016  . TETANUS/TDAP  12/31/2017        Plan:  Continue to eat heart healthy diet (full of fruits, vegetables, whole grains, lean protein, water--limit salt, fat, and sugar intake) and increase physical activity as tolerated.  Continue doing brain stimulating activities (puzzles, reading, adult coloring books, staying active) to keep memory sharp.   Colonoscopy by the end of the year.    Follow up with cardiologist and pulmonologist as scheduled.  Great job on signing up for the nutrition class.  Complete Cardiac Rehab.    Follow up with Dr. Etter Sjogren by the end of this month.    During the course of the visit Ashkan was educated and counseled about the following appropriate screening and preventive services:   Vaccines to include Pneumoccal, Influenza, Hepatitis B, Td, Zostavax, HCV  Electrocardiogram  Colorectal cancer screening  Cardiovascular disease screening  Diabetes screening  Glaucoma screening  Nutrition counseling  Prostate cancer screening  Smoking cessation counseling  Patient Instructions (the written plan) were given to the patient.   Eduard Roux, RN   12/08/2015

## 2015-12-08 NOTE — Progress Notes (Signed)
reviewed

## 2015-12-10 ENCOUNTER — Encounter (HOSPITAL_COMMUNITY)
Admission: RE | Admit: 2015-12-10 | Discharge: 2015-12-10 | Disposition: A | Discharge status: Home / Self Care | Payer: Medicare Other | Source: Ambulatory Visit | Attending: Pulmonary Disease | Admitting: Pulmonary Disease

## 2015-12-10 DIAGNOSIS — J849 Interstitial pulmonary disease, unspecified: Secondary | ICD-10-CM | POA: Diagnosis not present

## 2015-12-10 NOTE — Progress Notes (Signed)
.  Itiel completed a Six-Minute Walk Test on 12/10/15. Jacquese walked 823 feet with 1 rest break of 13 seconds.  The patient's lowest oxygen saturation was 86% RA which quickly came back to 89% RA with a 13 second rest break, highest heart rate was 88 bpm , and highest blood pressure was 152/82. The patient did not require supplemental oxygen for six walk test today. Patient reported no pain upon completion of walk test, pt did well and is a very pleasant person.      Rhet Rorke [employer]

## 2015-12-14 ENCOUNTER — Encounter: Payer: Self-pay | Admitting: Interventional Cardiology

## 2015-12-14 ENCOUNTER — Ambulatory Visit: Payer: Medicare Other | Admitting: Internal Medicine

## 2015-12-17 DIAGNOSIS — M25562 Pain in left knee: Secondary | ICD-10-CM | POA: Diagnosis not present

## 2015-12-18 ENCOUNTER — Encounter (HOSPITAL_COMMUNITY): Payer: Self-pay | Admitting: Emergency Medicine

## 2015-12-18 ENCOUNTER — Inpatient Hospital Stay (HOSPITAL_COMMUNITY)
Admission: EM | Admit: 2015-12-18 | Discharge: 2015-12-24 | DRG: 286 | Disposition: A | Discharge status: Home Health | Payer: Medicare Other | Attending: Cardiovascular Disease | Admitting: Cardiovascular Disease

## 2015-12-18 ENCOUNTER — Inpatient Hospital Stay (HOSPITAL_COMMUNITY): Payer: Medicare Other

## 2015-12-18 ENCOUNTER — Emergency Department (HOSPITAL_COMMUNITY): Payer: Medicare Other

## 2015-12-18 ENCOUNTER — Ambulatory Visit: Payer: Medicare Other | Admitting: Neurology

## 2015-12-18 DIAGNOSIS — G250 Essential tremor: Secondary | ICD-10-CM | POA: Diagnosis present

## 2015-12-18 DIAGNOSIS — R9439 Abnormal result of other cardiovascular function study: Secondary | ICD-10-CM | POA: Diagnosis not present

## 2015-12-18 DIAGNOSIS — K219 Gastro-esophageal reflux disease without esophagitis: Secondary | ICD-10-CM | POA: Diagnosis present

## 2015-12-18 DIAGNOSIS — Z8673 Personal history of transient ischemic attack (TIA), and cerebral infarction without residual deficits: Secondary | ICD-10-CM | POA: Diagnosis not present

## 2015-12-18 DIAGNOSIS — F419 Anxiety disorder, unspecified: Secondary | ICD-10-CM | POA: Diagnosis present

## 2015-12-18 DIAGNOSIS — Z9861 Coronary angioplasty status: Secondary | ICD-10-CM

## 2015-12-18 DIAGNOSIS — I5033 Acute on chronic diastolic (congestive) heart failure: Secondary | ICD-10-CM | POA: Diagnosis not present

## 2015-12-18 DIAGNOSIS — Z833 Family history of diabetes mellitus: Secondary | ICD-10-CM | POA: Diagnosis not present

## 2015-12-18 DIAGNOSIS — I252 Old myocardial infarction: Secondary | ICD-10-CM

## 2015-12-18 DIAGNOSIS — E1159 Type 2 diabetes mellitus with other circulatory complications: Secondary | ICD-10-CM | POA: Diagnosis not present

## 2015-12-18 DIAGNOSIS — Z955 Presence of coronary angioplasty implant and graft: Secondary | ICD-10-CM

## 2015-12-18 DIAGNOSIS — Z8249 Family history of ischemic heart disease and other diseases of the circulatory system: Secondary | ICD-10-CM

## 2015-12-18 DIAGNOSIS — I10 Essential (primary) hypertension: Secondary | ICD-10-CM | POA: Diagnosis present

## 2015-12-18 DIAGNOSIS — Z96653 Presence of artificial knee joint, bilateral: Secondary | ICD-10-CM | POA: Diagnosis present

## 2015-12-18 DIAGNOSIS — N400 Enlarged prostate without lower urinary tract symptoms: Secondary | ICD-10-CM | POA: Diagnosis present

## 2015-12-18 DIAGNOSIS — Z79899 Other long term (current) drug therapy: Secondary | ICD-10-CM | POA: Diagnosis not present

## 2015-12-18 DIAGNOSIS — R319 Hematuria, unspecified: Secondary | ICD-10-CM | POA: Diagnosis not present

## 2015-12-18 DIAGNOSIS — I25110 Atherosclerotic heart disease of native coronary artery with unstable angina pectoris: Secondary | ICD-10-CM | POA: Diagnosis not present

## 2015-12-18 DIAGNOSIS — I272 Other secondary pulmonary hypertension: Secondary | ICD-10-CM | POA: Diagnosis present

## 2015-12-18 DIAGNOSIS — Z8042 Family history of malignant neoplasm of prostate: Secondary | ICD-10-CM | POA: Diagnosis not present

## 2015-12-18 DIAGNOSIS — E785 Hyperlipidemia, unspecified: Secondary | ICD-10-CM | POA: Diagnosis not present

## 2015-12-18 DIAGNOSIS — Z7982 Long term (current) use of aspirin: Secondary | ICD-10-CM

## 2015-12-18 DIAGNOSIS — Z6841 Body Mass Index (BMI) 40.0 and over, adult: Secondary | ICD-10-CM | POA: Diagnosis not present

## 2015-12-18 DIAGNOSIS — E1165 Type 2 diabetes mellitus with hyperglycemia: Secondary | ICD-10-CM | POA: Diagnosis present

## 2015-12-18 DIAGNOSIS — G4733 Obstructive sleep apnea (adult) (pediatric): Secondary | ICD-10-CM | POA: Diagnosis present

## 2015-12-18 DIAGNOSIS — R079 Chest pain, unspecified: Secondary | ICD-10-CM | POA: Diagnosis not present

## 2015-12-18 DIAGNOSIS — I119 Hypertensive heart disease without heart failure: Secondary | ICD-10-CM

## 2015-12-18 DIAGNOSIS — R0602 Shortness of breath: Secondary | ICD-10-CM | POA: Diagnosis not present

## 2015-12-18 DIAGNOSIS — Z794 Long term (current) use of insulin: Secondary | ICD-10-CM | POA: Diagnosis not present

## 2015-12-18 DIAGNOSIS — I2510 Atherosclerotic heart disease of native coronary artery without angina pectoris: Secondary | ICD-10-CM

## 2015-12-18 DIAGNOSIS — I200 Unstable angina: Secondary | ICD-10-CM | POA: Diagnosis not present

## 2015-12-18 HISTORY — DX: Essential tremor: G25.0

## 2015-12-18 HISTORY — DX: Chronic diastolic (congestive) heart failure: I50.32

## 2015-12-18 LAB — I-STAT TROPONIN, ED: Troponin i, poc: 0.01 ng/mL (ref 0.00–0.08)

## 2015-12-18 LAB — TROPONIN I: Troponin I: <0.03 ng/mL (ref <0.031)

## 2015-12-18 LAB — CBC
HCT: 40.9 % (ref 39.0–52.0)
Hemoglobin: 13.3 g/dL (ref 13.0–17.0)
MCH: 28.2 pg (ref 26.0–34.0)
MCHC: 32.5 g/dL (ref 30.0–36.0)
MCV: 86.7 fL (ref 78.0–100.0)
PLATELETS: 246 10*3/uL (ref 150–400)
RBC: 4.72 MIL/uL (ref 4.22–5.81)
RDW: 13.2 % (ref 11.5–15.5)
WBC: 6.5 10*3/uL (ref 4.0–10.5)

## 2015-12-18 LAB — MRSA PCR SCREENING: MRSA by PCR: NEGATIVE

## 2015-12-18 LAB — BASIC METABOLIC PANEL
ANION GAP: 12 (ref 5–15)
BUN: 15 mg/dL (ref 6–20)
CALCIUM: 9.4 mg/dL (ref 8.9–10.3)
CO2: 25 mmol/L (ref 22–32)
Chloride: 105 mmol/L (ref 101–111)
Creatinine, Ser: 1.14 mg/dL (ref 0.61–1.24)
Glucose, Bld: 162 mg/dL — ABNORMAL HIGH (ref 65–99)
Potassium: 4.1 mmol/L (ref 3.5–5.1)
Sodium: 142 mmol/L (ref 135–145)

## 2015-12-18 LAB — LIPID PANEL
CHOL/HDL RATIO: 5.1 ratio
Cholesterol: 172 mg/dL (ref 0–200)
HDL: 34 mg/dL — AB (ref >40)
LDL CALC: 115 mg/dL — AB (ref 0–99)
TRIGLYCERIDES: 117 mg/dL (ref <150)
VLDL: 23 mg/dL (ref 0–40)

## 2015-12-18 LAB — BRAIN NATRIURETIC PEPTIDE: B Natriuretic Peptide: 43.8 pg/mL (ref 0.0–100.0)

## 2015-12-18 LAB — PROTIME-INR
INR: 1.05 (ref 0.00–1.49)
Prothrombin Time: 13.9 seconds (ref 11.6–15.2)

## 2015-12-18 LAB — GLUCOSE, CAPILLARY: Glucose-Capillary: 321 mg/dL — ABNORMAL HIGH (ref 65–99)

## 2015-12-18 LAB — CBG MONITORING, ED: GLUCOSE-CAPILLARY: 202 mg/dL — AB (ref 65–99)

## 2015-12-18 MED ORDER — ZOLPIDEM TARTRATE 5 MG PO TABS
5.0000 mg | ORAL_TABLET | Freq: Every evening | ORAL | Status: DC | PRN
  Administered 2015-12-20 – 2015-12-22 (×3): 5 mg via ORAL
  Filled 2015-12-18 (×4): qty 1

## 2015-12-18 MED ORDER — ASPIRIN EC 81 MG PO TBEC
81.0000 mg | DELAYED_RELEASE_TABLET | Freq: Every day | ORAL | Status: DC
  Administered 2015-12-19 – 2015-12-21 (×3): 81 mg via ORAL
  Filled 2015-12-18 (×4): qty 1

## 2015-12-18 MED ORDER — MORPHINE SULFATE (PF) 2 MG/ML IV SOLN
2.0000 mg | INTRAVENOUS | Status: DC | PRN
  Administered 2015-12-18: 4 mg via INTRAVENOUS
  Administered 2015-12-18 (×2): 2 mg via INTRAVENOUS
  Administered 2015-12-18 – 2015-12-19 (×2): 4 mg via INTRAVENOUS
  Administered 2015-12-19 (×2): 2 mg via INTRAVENOUS
  Administered 2015-12-20 – 2015-12-23 (×4): 4 mg via INTRAVENOUS
  Administered 2015-12-23: 2 mg via INTRAVENOUS
  Filled 2015-12-18 (×4): qty 2
  Filled 2015-12-18: qty 1
  Filled 2015-12-18: qty 2
  Filled 2015-12-18 (×2): qty 1
  Filled 2015-12-18: qty 2
  Filled 2015-12-18: qty 1
  Filled 2015-12-18: qty 2
  Filled 2015-12-18: qty 1

## 2015-12-18 MED ORDER — GI COCKTAIL ~~LOC~~
30.0000 mL | Freq: Three times a day (TID) | ORAL | Status: DC | PRN
  Administered 2015-12-18 – 2015-12-19 (×2): 30 mL via ORAL
  Filled 2015-12-18 (×3): qty 30

## 2015-12-18 MED ORDER — PROPRANOLOL HCL 40 MG PO TABS
40.0000 mg | ORAL_TABLET | Freq: Two times a day (BID) | ORAL | Status: DC
  Administered 2015-12-19 – 2015-12-24 (×10): 40 mg via ORAL
  Filled 2015-12-18 (×12): qty 1

## 2015-12-18 MED ORDER — ISOSORBIDE MONONITRATE ER 30 MG PO TB24
30.0000 mg | ORAL_TABLET | Freq: Every day | ORAL | Status: DC
Start: 2015-12-18 — End: 2015-12-18

## 2015-12-18 MED ORDER — ACETAMINOPHEN 325 MG PO TABS
650.0000 mg | ORAL_TABLET | ORAL | Status: DC | PRN
  Administered 2015-12-19 – 2015-12-20 (×4): 650 mg via ORAL
  Filled 2015-12-18 (×3): qty 2

## 2015-12-18 MED ORDER — FENOFIBRATE 160 MG PO TABS
160.0000 mg | ORAL_TABLET | Freq: Every day | ORAL | Status: DC
  Administered 2015-12-18 – 2015-12-24 (×7): 160 mg via ORAL
  Filled 2015-12-18 (×8): qty 1

## 2015-12-18 MED ORDER — MAGNESIUM HYDROXIDE 400 MG/5ML PO SUSP
30.0000 mL | Freq: Every day | ORAL | Status: DC | PRN
  Administered 2015-12-18 – 2015-12-23 (×2): 30 mL via ORAL
  Filled 2015-12-18 (×3): qty 30

## 2015-12-18 MED ORDER — CARBOXYMETHYLCELLULOSE SODIUM 1 % OP SOLN: 1.0000 [drp] | Freq: Three times a day (TID) | OPHTHALMIC | Status: DC | PRN

## 2015-12-18 MED ORDER — INSULIN ASPART 100 UNIT/ML ~~LOC~~ SOLN: 40.0000 [IU] | Freq: Three times a day (TID) | SUBCUTANEOUS | Status: DC

## 2015-12-18 MED ORDER — ENSURE ENLIVE PO LIQD
237.0000 mL | Freq: Two times a day (BID) | ORAL | Status: DC
  Administered 2015-12-19 – 2015-12-20 (×2): 237 mL via ORAL

## 2015-12-18 MED ORDER — POTASSIUM CHLORIDE CRYS ER 20 MEQ PO TBCR
20.0000 meq | EXTENDED_RELEASE_TABLET | Freq: Every day | ORAL | Status: DC
  Administered 2015-12-18 – 2015-12-24 (×7): 20 meq via ORAL
  Filled 2015-12-18 (×7): qty 1

## 2015-12-18 MED ORDER — PRAVASTATIN SODIUM 40 MG PO TABS
40.0000 mg | ORAL_TABLET | Freq: Every day | ORAL | Status: DC
  Administered 2015-12-19 – 2015-12-24 (×6): 40 mg via ORAL
  Filled 2015-12-18 (×6): qty 1

## 2015-12-18 MED ORDER — DICYCLOMINE HCL 10 MG PO CAPS
10.0000 mg | ORAL_CAPSULE | Freq: Three times a day (TID) | ORAL | Status: DC
  Administered 2015-12-18 – 2015-12-24 (×17): 10 mg via ORAL
  Filled 2015-12-18 (×18): qty 1

## 2015-12-18 MED ORDER — METOPROLOL TARTRATE 50 MG PO TABS
50.0000 mg | ORAL_TABLET | Freq: Two times a day (BID) | ORAL | Status: DC
  Administered 2015-12-18 – 2015-12-24 (×12): 50 mg via ORAL
  Filled 2015-12-18 (×3): qty 1
  Filled 2015-12-18: qty 2
  Filled 2015-12-18 (×9): qty 1

## 2015-12-18 MED ORDER — SODIUM CHLORIDE 0.9% FLUSH
3.0000 mL | Freq: Two times a day (BID) | INTRAVENOUS | Status: DC
  Administered 2015-12-18 – 2015-12-24 (×6): 3 mL via INTRAVENOUS

## 2015-12-18 MED ORDER — ENOXAPARIN SODIUM 40 MG/0.4ML ~~LOC~~ SOLN
40.0000 mg | SUBCUTANEOUS | Status: DC
Start: 2015-12-18 — End: 2015-12-18

## 2015-12-18 MED ORDER — ALBUTEROL SULFATE HFA 108 (90 BASE) MCG/ACT IN AERS: 2.0000 | INHALATION_SPRAY | Freq: Four times a day (QID) | RESPIRATORY_TRACT | Status: DC | PRN

## 2015-12-18 MED ORDER — SODIUM CHLORIDE 0.9% FLUSH: 3.0000 mL | INTRAVENOUS | Status: DC | PRN

## 2015-12-18 MED ORDER — PRASUGREL HCL 10 MG PO TABS
10.0000 mg | ORAL_TABLET | Freq: Every day | ORAL | Status: DC
  Administered 2015-12-19 – 2015-12-21 (×3): 10 mg via ORAL
  Filled 2015-12-18 (×3): qty 1

## 2015-12-18 MED ORDER — NITROGLYCERIN IN D5W 200-5 MCG/ML-% IV SOLN
2.0000 ug/min | INTRAVENOUS | Status: DC
  Administered 2015-12-18: 10 ug/min via INTRAVENOUS
  Administered 2015-12-19: 15 ug/min via INTRAVENOUS
  Administered 2015-12-20: 20 ug/min via INTRAVENOUS
  Administered 2015-12-21: 15 ug/min via INTRAVENOUS
  Filled 2015-12-18 (×3): qty 250

## 2015-12-18 MED ORDER — PROPRANOLOL HCL 40 MG PO TABS
40.0000 mg | ORAL_TABLET | Freq: Two times a day (BID) | ORAL | Status: DC
  Filled 2015-12-18: qty 1

## 2015-12-18 MED ORDER — INSULIN ASPART PROT & ASPART (70-30 MIX) 100 UNIT/ML ~~LOC~~ SUSP
30.0000 [IU] | Freq: Every day | SUBCUTANEOUS | Status: DC
  Administered 2015-12-18 – 2015-12-19 (×2): 30 [IU] via SUBCUTANEOUS
  Filled 2015-12-18: qty 10

## 2015-12-18 MED ORDER — LORAZEPAM 2 MG/ML IJ SOLN
0.5000 mg | Freq: Once | INTRAMUSCULAR | Status: AC
  Administered 2015-12-18: 0.5 mg via INTRAVENOUS
  Filled 2015-12-18: qty 1

## 2015-12-18 MED ORDER — AMLODIPINE BESYLATE 10 MG PO TABS
10.0000 mg | ORAL_TABLET | Freq: Every day | ORAL | Status: DC
Start: 2015-12-18 — End: 2015-12-24
  Administered 2015-12-19 – 2015-12-24 (×6): 10 mg via ORAL
  Filled 2015-12-18 (×2): qty 1
  Filled 2015-12-18 (×3): qty 2
  Filled 2015-12-18 (×2): qty 1

## 2015-12-18 MED ORDER — INSULIN ASPART PROT & ASPART (70-30 MIX) 100 UNIT/ML ~~LOC~~ SUSP
60.0000 [IU] | Freq: Every day | SUBCUTANEOUS | Status: DC
  Filled 2015-12-18: qty 10

## 2015-12-18 MED ORDER — ALBUTEROL SULFATE (2.5 MG/3ML) 0.083% IN NEBU
2.5000 mg | INHALATION_SOLUTION | Freq: Four times a day (QID) | RESPIRATORY_TRACT | Status: DC | PRN
  Administered 2015-12-22: 2.5 mg via RESPIRATORY_TRACT
  Filled 2015-12-18: qty 3

## 2015-12-18 MED ORDER — PANTOPRAZOLE SODIUM 40 MG PO TBEC
40.0000 mg | DELAYED_RELEASE_TABLET | Freq: Every day | ORAL | Status: DC
  Administered 2015-12-18 – 2015-12-24 (×7): 40 mg via ORAL
  Filled 2015-12-18 (×7): qty 1

## 2015-12-18 MED ORDER — INSULIN ASPART PROT & ASPART (70-30 MIX) 100 UNIT/ML ~~LOC~~ SUSP: 40.0000 [IU] | Freq: Every day | SUBCUTANEOUS | Status: DC

## 2015-12-18 MED ORDER — CLONAZEPAM 0.5 MG PO TABS: 1.0000 mg | ORAL_TABLET | Freq: Every evening | ORAL | Status: DC | PRN

## 2015-12-18 MED ORDER — INSULIN REGULAR HUMAN (CONC) 500 UNIT/ML ~~LOC~~ SOLN: 14.0000 [IU] | Freq: Three times a day (TID) | SUBCUTANEOUS | Status: DC

## 2015-12-18 MED ORDER — ONDANSETRON HCL 4 MG/2ML IJ SOLN: 4.0000 mg | Freq: Four times a day (QID) | INTRAMUSCULAR | Status: DC | PRN

## 2015-12-18 MED ORDER — POLYVINYL ALCOHOL 1.4 % OP SOLN: 1.0000 [drp] | Freq: Three times a day (TID) | OPHTHALMIC | Status: DC | PRN

## 2015-12-18 MED ORDER — INSULIN ASPART 100 UNIT/ML ~~LOC~~ SOLN
0.0000 [IU] | Freq: Every day | SUBCUTANEOUS | Status: DC
Start: 2015-12-18 — End: 2015-12-24
  Administered 2015-12-18: 4 [IU] via SUBCUTANEOUS
  Administered 2015-12-19 – 2015-12-20 (×2): 2 [IU] via SUBCUTANEOUS
  Administered 2015-12-21 – 2015-12-22 (×2): 4 [IU] via SUBCUTANEOUS

## 2015-12-18 MED ORDER — ENOXAPARIN SODIUM 80 MG/0.8ML ~~LOC~~ SOLN
0.5000 mg/kg | SUBCUTANEOUS | Status: DC
  Administered 2015-12-18: 65 mg via SUBCUTANEOUS
  Filled 2015-12-18: qty 0.8

## 2015-12-18 MED ORDER — INSULIN ASPART 100 UNIT/ML ~~LOC~~ SOLN
0.0000 [IU] | Freq: Three times a day (TID) | SUBCUTANEOUS | Status: DC
  Administered 2015-12-19 (×2): 8 [IU] via SUBCUTANEOUS
  Administered 2015-12-19: 3 [IU] via SUBCUTANEOUS
  Administered 2015-12-20: 11 [IU] via SUBCUTANEOUS
  Administered 2015-12-20: 3 [IU] via SUBCUTANEOUS
  Administered 2015-12-20: 5 [IU] via SUBCUTANEOUS
  Administered 2015-12-21: 8 [IU] via SUBCUTANEOUS
  Administered 2015-12-21: 5 [IU] via SUBCUTANEOUS
  Administered 2015-12-22 (×2): 8 [IU] via SUBCUTANEOUS
  Administered 2015-12-22: 11 [IU] via SUBCUTANEOUS
  Administered 2015-12-23: 5 [IU] via SUBCUTANEOUS
  Administered 2015-12-23 (×2): 8 [IU] via SUBCUTANEOUS
  Administered 2015-12-24: 5 [IU] via SUBCUTANEOUS
  Administered 2015-12-24: 15 [IU] via SUBCUTANEOUS

## 2015-12-18 MED ORDER — SODIUM CHLORIDE 0.9 % IV SOLN: 250.0000 mL | INTRAVENOUS | Status: DC | PRN

## 2015-12-18 MED ORDER — FUROSEMIDE 40 MG PO TABS
40.0000 mg | ORAL_TABLET | Freq: Every day | ORAL | Status: DC
  Administered 2015-12-18 – 2015-12-20 (×3): 40 mg via ORAL
  Filled 2015-12-18: qty 2
  Filled 2015-12-18 (×2): qty 1

## 2015-12-18 MED ORDER — ALPRAZOLAM 0.25 MG PO TABS
0.2500 mg | ORAL_TABLET | Freq: Two times a day (BID) | ORAL | Status: DC | PRN
  Administered 2015-12-18: 0.25 mg via ORAL
  Filled 2015-12-18: qty 1

## 2015-12-18 MED ORDER — BUDESONIDE-FORMOTEROL FUMARATE 160-4.5 MCG/ACT IN AERO
2.0000 | INHALATION_SPRAY | Freq: Two times a day (BID) | RESPIRATORY_TRACT | Status: DC
  Administered 2015-12-19 – 2015-12-24 (×6): 2 via RESPIRATORY_TRACT
  Filled 2015-12-18: qty 6

## 2015-12-18 MED ORDER — NITROGLYCERIN 0.4 MG SL SUBL
0.4000 mg | SUBLINGUAL_TABLET | SUBLINGUAL | Status: DC | PRN
  Filled 2015-12-18: qty 1

## 2015-12-18 NOTE — ED Notes (Signed)
Called pharmacy RE insulin

## 2015-12-18 NOTE — Progress Notes (Signed)
Inpatient Diabetes Program Recommendations  AACE/ADA: New Consensus Statement on Inpatient Glycemic Control (2015)  Target Ranges:  Prepandial:   less than 140 mg/dL      Peak postprandial:   less than 180 mg/dL (1-2 hours)      Critically ill patients:  140 - 180 mg/dL   Review of Glycemic Control  Diabetes history: DM 2 Outpatient Diabetes medications: 70/30 60 units QPM ONLY, Novolin R 40 units TID with meals Current orders for Inpatient glycemic control: 70/30 40 units QAM, 60 units QPM, Novolog 40 units TID with meals  Spoke with patient about home regimen of DM medications. Patient takes 70/30 60 units Q HS only, and the 40 units of Novolin R. Patient reports being taken off U-500 concentrated Humalog Monday and was transitioned to his current regimen. Patient reports recently being enrolled in a DM 12 week program for diet and DM education. Patient reports being noncompliant with diet currently. Patient has had DM since 64s and has had Diet education from [facility].  Inpatient Diabetes Reccomendations: While inpatient, and patient being on a controlled Carb modified diet, please order part of his 70/30 dose 40 units QPM only, and add Novolog Moderate Correction TID plus HS scale. Patient may need meal coverage added tomorrow if glucose trends upward after meals.  Thanks,  Tama Headings RN, MSN, [facility] Inpatient Diabetes Coordinator Team Pager [phone] (8a-5p)

## 2015-12-18 NOTE — ED Notes (Signed)
Pt states no relief with GI cocktail.

## 2015-12-18 NOTE — ED Notes (Signed)
Patient transported to X-ray 

## 2015-12-18 NOTE — ED Notes (Signed)
Cardiology at bedside.

## 2015-12-18 NOTE — ED Notes (Signed)
Pt ate lunch then requested to use BR for bm, up to [facility] just voided states strained and had some blood  Small amt from bottom dime size from rectum. No BM

## 2015-12-18 NOTE — Progress Notes (Signed)
Echocardiogram 2D Echocardiogram has been performed.  Edward [name] 12/18/2015, 1:48 PM

## 2015-12-18 NOTE — ED Provider Notes (Signed)
CSN: [id number]     Arrival date & time 12/18/15  4235 History   None    Chief Complaint  Patient presents with  . Chest Pain  . Shortness of Breath   HPI Comments: Patient reports that he has had a 2 month history of intermittent chest pain, worse with exertion.  He also endorses SOB with exertion over the last couple of months.  He notes that CP was worse this am, so he decided it was time to come for evaluation.  Endorses associated diaphoresis and radiation of chest pain to left arm.  He has a history of MI and stent placement in 01/2015.  He sees Dr Daneen Schick for cardiology.  Next visit 2/24.  He is endorsing 8/10 chest pain now.  He took his Effient, Nitro and an ASA this am.  Endorses orthopnea and increased LE edema.  He is also being seen by pulmonology and set to start Pulmonary rehab on Mon.  The history is provided by the patient. No language interpreter was used.   Past Medical History  Diagnosis Date  . Hypertension   . CAD (coronary artery disease)     a. Pt reports "small heart attack" in 2013 at [facility], denies need for stent or CABG, does not know findings of cath.  Marland Kitchen TIA (transient ischemic attack) 2002  . Dyslipidemia, goal LDL below 70 01/29/2014  . GERD (gastroesophageal reflux disease)   . Enlarged prostate   . Shortness of breath     exertion  . DM type 2 (diabetes mellitus, type 2), insulin dependent 01/29/2014    fasting cbg 50-120 with new regimen  . Anxiety   . Heart attack ([place]) 02/03/2014    "mild heart attack"  . Pneumonia 01/2013  . Pneumonia 03/2014    hospitalized  . Sleep apnea     does not use cpap (06/04/2015)  . TIRWERXV(400.8)     "monthly" (06/04/2015)  . Arthritis     'all over"  . Tremor of both hands   . Adenomatous colon polyp 04/2011  . Diverticulosis   . Internal hemorrhoid    Past Surgical History  Procedure Laterality Date  . Total knee arthroplasty Bilateral   . Lumbar disc surgery    . Cataract extraction  Bilateral   . Joint replacement    . Back surgery    . Cataract extraction, bilateral Bilateral   . Shoulder arthroscopy Right 07/30/2014    Procedure: Right Shoulder Arthroscopy, Debridement, Decompression, Manipulation Under Anesthesia;  Surgeon: Newt Minion, MD;  Location: [facility];  Service: Orthopedics;  Laterality: Right;  . Left heart catheterization with coronary angiogram N/A 01/28/2014    Procedure: LEFT HEART CATHETERIZATION WITH CORONARY ANGIOGRAM;  Surgeon: Sinclair Grooms, MD;  Location: [facility] CATH LAB;  Service: Cardiovascular;  Laterality: N/A;  . Left heart catheterization with coronary angiogram N/A 01/30/2015    Procedure: LEFT HEART CATHETERIZATION WITH CORONARY ANGIOGRAM;  Surgeon: Belva Crome, MD;  Location: [facility] CATH LAB;  Service: Cardiovascular;  Laterality: N/A;  . Cardiac catheterization  01/28/14    + CAD treat medically  . Coronary angioplasty with stent placement  01/30/2015    DES Promus  Premier to LAD by Dr Tamala Julian   Family History  Problem Relation Age of Onset  . CAD Brother     2 brothers - CABG  . Alzheimer's disease Sister   . CAD Sister   . Prostate cancer Brother   . Diabetes Other  entire family  . Asthma Brother     2 brothers    Social History  Substance Use Topics  . Smoking status: Never Smoker   . Smokeless tobacco: Never Used  . Alcohol Use: No    Review of Systems  Constitutional: Positive for diaphoresis, activity change and fatigue. Negative for fever.  Eyes: Positive for visual disturbance (blurred vision with CP and SOB). Negative for photophobia.  Respiratory: Positive for shortness of breath.   Cardiovascular: Positive for chest pain, palpitations and leg swelling.  Gastrointestinal: Negative for nausea, vomiting, abdominal pain and diarrhea.  Neurological: Negative for dizziness, syncope and headaches.   Allergies  Review of patient's allergies indicates no known allergies.  Home Medications   Prior to Admission  medications   Medication Sig Start Date End Date Taking? Authorizing Provider  amLODipine (NORVASC) 10 MG tablet Take 10 mg by mouth daily. 08/06/15   Historical Provider, MD  aspirin EC 81 MG tablet Take 81 mg by mouth daily.    Historical Provider, MD  Blood Glucose Monitoring Suppl (ONE TOUCH ULTRA SYSTEM KIT) W/DEVICE KIT Use to test blood sugar daily as instructed. Dx code: E11.59 12/30/14   Philemon Kingdom, MD  Ciclopirox 1 % shampoo Apply 1 application topically at bedtime. At night after showering 11/20/14   Historical Provider, MD  clonazePAM (KLONOPIN) 1 MG tablet Take 1 tablet (1 mg total) by mouth at bedtime as needed for anxiety. 08/31/15   Rosalita Chessman, DO  dextromethorphan-guaiFENesin (MUCINEX DM) 30-600 MG 12hr tablet Take 1 tablet by mouth 2 (two) times daily. 11/01/15   Debbe Odea, MD  diclofenac sodium (VOLTAREN) 1 % GEL Apply 2 g topically 4 (four) times daily. Patient not taking: Reported on 12/08/2015 11/01/15   Debbe Odea, MD  dicyclomine (BENTYL) 10 MG capsule Take 1 capsule (10 mg total) by mouth 3 (three) times daily before meals. 07/30/15   Ladene Artist, MD  esomeprazole (NEXIUM) 40 MG capsule Take 40 mg by mouth daily at 12 noon.    Historical Provider, MD  fenofibrate 160 MG tablet Take 160 mg by mouth daily. 07/17/15   Historical Provider, MD  Fluticasone Furoate-Vilanterol (BREO ELLIPTA) 100-25 MCG/INH AEPB Inhale 1 puff into the lungs daily. 12/02/15   Juanito Doom, MD  furosemide (LASIX) 40 MG tablet Take 1 tablet (40 mg total) by mouth daily. 11/03/15   Belva Crome, MD  glucose blood test strip Use to test blood sugar 4 times daily instructed. Dx: E11.59 11/06/15   Philemon Kingdom, MD  insulin regular human CONCENTRATED (HUMULIN R) 500 UNIT/ML injection Inject 0.14-0.18 ml (draw to 14-18 units on the insulin syringe) (70-90 units of insulin) 3x a day, 30 min before meals 08/25/15   Philemon Kingdom, MD  Insulin Syringes, Disposable, U-100 1 ML MISC Use 4x a day  08/20/15   Philemon Kingdom, MD  isosorbide mononitrate (IMDUR) 30 MG 24 hr tablet Take 1 tablet (30 mg total) by mouth daily. 11/01/15   Debbe Odea, MD  metoprolol (LOPRESSOR) 50 MG tablet Take 1 tablet (50 mg total) by mouth 2 (two) times daily. 04/06/15   Imogene Burn, PA-C  nitroGLYCERIN (NITROSTAT) 0.4 MG SL tablet Place 1 tablet (0.4 mg total) under the tongue every 5 (five) minutes as needed for chest pain (CP or SOB). 01/31/15   Erlene Quan, PA-C  Polyvinyl Alcohol-Povidone (REFRESH OP) Place 1 drop into both eyes daily as needed (for dry eyes).    Historical Provider, MD  potassium chloride SA (K-DUR,KLOR-CON) 20 MEQ tablet Take 1 tablet (20 mEq total) by mouth daily. 09/21/15   Belva Crome, MD  prasugrel (EFFIENT) 10 MG TABS tablet Take 1 tablet (10 mg total) by mouth daily. 08/25/15   Belva Crome, MD  pravastatin (PRAVACHOL) 40 MG tablet Take 1 tablet (40 mg total) by mouth daily. 02/12/15   Rosalita Chessman, DO  propranolol (INDERAL) 40 MG tablet Take 1 tablet (40 mg total) by mouth 2 (two) times daily. 07/29/15   Donika K Patel, DO  VENTOLIN HFA 108 (90 BASE) MCG/ACT inhaler Inhale 2 puffs into the lungs every 6 (six) hours as needed. (SHORTNESS OR BREATH / WHEEZING) 06/22/15   Historical Provider, MD   BP 220/69 mmHg  Pulse 62  Resp 14  Ht 5' 7.25" (1.708 m)  Wt 128.822 kg  BMI 44.16 kg/m2  SpO2 100% Physical Exam  Constitutional: He is oriented to person, place, and time. He appears well-developed and well-nourished. No distress.  obese  HENT:  Head: Normocephalic and atraumatic.  Mouth/Throat: Oropharynx is clear and moist. No oropharyngeal exudate.  Eyes: Conjunctivae and EOM are normal. No scleral icterus.  Neck: Normal range of motion. Neck supple.  Cardiovascular: Normal rate, regular rhythm, normal heart sounds and intact distal pulses.   No murmur heard. Pulmonary/Chest: Effort normal and breath sounds normal. No respiratory distress. He has no rales.  Abdominal:  Soft. Bowel sounds are normal. He exhibits no distension. There is no tenderness. There is no rebound and no guarding.  Musculoskeletal: Normal range of motion. He exhibits edema (trace LE). He exhibits no tenderness.  Neurological: He is alert and oriented to person, place, and time.  Skin: Skin is warm and dry. No rash noted. He is not diaphoretic.  Psychiatric:  Anxious    ED Course  Procedures (including critical care time) Labs Review Labs Reviewed  BASIC METABOLIC PANEL - Abnormal; Notable for the following:    Glucose, Bld 162 (*)    All other components within normal limits  CBC  BRAIN NATRIURETIC PEPTIDE  I-STAT TROPOININ, ED    Imaging Review Dg Chest 2 View  12/18/2015  CLINICAL DATA:  Chest pain and shortness of breath EXAM: CHEST  2 VIEW COMPARISON:  Chest radiograph October 31, 2015 ; chest CT August 04, 2015 FINDINGS: There is no edema or consolidation. Heart is upper normal in size with pulmonary vascularity within normal limits. No adenopathy. There is mid thoracic dextroscoliosis. There is degenerative change in the thoracic spine. IMPRESSION: No edema or consolidation.  Heart upper normal in size. Electronically Signed   By: Lowella Grip III M.D.   On: 12/18/2015 07:43   I have personally reviewed and evaluated these images and lab results as part of my medical decision-making.   EKG Interpretation   Date/Time:  Friday December 18 2015 06:56:11 EST Ventricular Rate:  63 PR Interval:  248 QRS Duration: 95 QT Interval:  421 QTC Calculation: 431 R Axis:   45 Text Interpretation:  Sinus rhythm Atrial premature complexes Prolonged PR  interval No significant change since last tracing Confirmed by Ashok Cordia  MD,  Lennette Bihari ([id number]) on 12/18/2015 7:12:07 AM      MDM   Final diagnoses:  Unstable angina ([place])    0710: EKG with prolonged PR interval but no new ischemic changes. Itrop, BMP, CBC, CXR ordered.  Patient placed on 2L O2 [place] for comfort. VSS.  0749:  CXR negative for acute cardiopulmonary disease.  0800: Chart reviewed  further.  It appears that patient has had similar CP with dyspnea/orthopnea.  Last admission 10/31/2015.  Will c/s to cardiology for further evaluation. Labs are pending.  JAHMEER PORCHE is a 75 y.o. male that presents to ED with CP, SOB.  He has a PMH of CAD with LAD drug eluting stent placement in 01/2015.  He is seen by Dr Daneen Schick for cardiology.  He is also followed by Pulmonology for dyspnea that is thought to be 2/2 obesity and deconditioning vs ILD.  Chest pain is atypical given duration of symptoms but patient has significant cardiac history.  Heart score 6 so far, awaiting trop.  CBC unremarkable.  BMP with BG 162, otherwise unremarkable.  BNP still in process.  Cardiology to admit patient.  He has been placed on Nitro and Heparin gtt by cards.  Expect possible cath if pain cannot be controlled easily.   Janora Norlander, DO 12/18/15 0848  Lajean Saver, MD 12/18/15 [phone]

## 2015-12-18 NOTE — H&P (Signed)
History and Physical   Patient ID: Edward [name] MRN: [id number], DOB/AGE: 05-04-1941 75 y.o. Date of Encounter: 12/18/2015  Primary Physician: Garnet Koyanagi, DO Primary Cardiologist: Dr Tamala Julian  Chief Complaint:  Chest pain  HPI: Edward [name] is a 75 y.o. male with a history of DES LAD 01/2015, HTN, TIA, HLD, GERD, DM2, OSA told CPAP not needed.  He has been in cardiac rehab trying to increase activity and lose weight. 02/09, 6" walk was 823 feet, sats briefly 86%, improved to 89%. He has gained 25 lbs over the last 6 months, he thinks due to changes in his DM meds. Pt has noted increased DOE this week, getting SOB going to the mailbox and having trouble walking around the grocery store. No chest pain but SOB with diaphoresis.  Pt woke this am with chest pain, tightness and pressure. The pain went into L shoulder. He was SOB, diaphoretic and nauseated. The pain reached 8/10. He took his ASA and Effient, then took SL NTG x 2. They helped, but he did not have any more. He felt very bad and called EMS. EMS gave him 81 mg ASA x 3 and SL NTG. Currently in the ER, he is still having 8/10 pain. His usual angina.    Past Medical History  Diagnosis Date  . Hypertension   . CAD (coronary artery disease)     a. Pt reports "small heart attack" in 2013 at [facility], denies need for stent or CABG, does not know findings of cath.  Marland Kitchen TIA (transient ischemic attack) 2002  . Dyslipidemia, goal LDL below 70 01/29/2014  . GERD (gastroesophageal reflux disease)   . Enlarged prostate   . Shortness of breath     exertion  . DM type 2 (diabetes mellitus, type 2), insulin dependent 01/29/2014    fasting cbg 50-120 with new regimen  . Anxiety   . Heart attack ([place]) 02/03/2014    "mild heart attack"  . Pneumonia 01/2013  . Pneumonia 03/2014    hospitalized  . Sleep apnea     does not use cpap (06/04/2015)  . TTSVXBLT(903.0)     "monthly" (06/04/2015)  . Arthritis     'all over"  .  Tremor of both hands   . Adenomatous colon polyp 04/2011  . Diverticulosis   . Internal hemorrhoid     Surgical History:  Past Surgical History  Procedure Laterality Date  . Total knee arthroplasty Bilateral   . Lumbar disc surgery    . Cataract extraction Bilateral   . Joint replacement    . Back surgery    . Cataract extraction, bilateral Bilateral   . Shoulder arthroscopy Right 07/30/2014    Procedure: Right Shoulder Arthroscopy, Debridement, Decompression, Manipulation Under Anesthesia;  Surgeon: Newt Minion, MD;  Location: [place] [facility];  Service: Orthopedics;  Laterality: Right;  . Left heart catheterization with coronary angiogram N/A 01/28/2014    Procedure: LEFT HEART CATHETERIZATION WITH CORONARY ANGIOGRAM;  Surgeon: Sinclair Grooms, MD;  Location: [facility] CATH LAB;  Service: Cardiovascular;  Laterality: N/A;  . Left heart catheterization with coronary angiogram N/A 01/30/2015    Procedure: LEFT HEART CATHETERIZATION WITH CORONARY ANGIOGRAM;  Surgeon: Belva Crome, MD;  Location: [facility] CATH LAB;  Service: Cardiovascular;  Laterality: N/A;  . Cardiac catheterization  01/28/14    + CAD treat medically  . Coronary angioplasty with stent placement  01/30/2015    DES Promus  Premier to LAD by Dr  Tamala Julian     I have reviewed the patient's current medications. Prior to Admission medications   Medication Sig Start Date End Date Taking? Authorizing Provider  amLODipine (NORVASC) 10 MG tablet Take 10 mg by mouth daily. 08/06/15   Historical Provider, MD  aspirin EC 81 MG tablet Take 81 mg by mouth daily.    Historical Provider, MD  Blood Glucose Monitoring Suppl (ONE TOUCH ULTRA SYSTEM KIT) W/DEVICE KIT Use to test blood sugar daily as instructed. Dx code: E11.59 12/30/14   Philemon Kingdom, MD  Ciclopirox 1 % shampoo Apply 1 application topically at bedtime. At night after showering 11/20/14   Historical Provider, MD  clonazePAM (KLONOPIN) 1 MG tablet Take 1 tablet (1 mg total) by mouth at bedtime as  needed for anxiety. 08/31/15   Rosalita Chessman, DO  dextromethorphan-guaiFENesin (MUCINEX DM) 30-600 MG 12hr tablet Take 1 tablet by mouth 2 (two) times daily. 11/01/15   Debbe Odea, MD  diclofenac sodium (VOLTAREN) 1 % GEL Apply 2 g topically 4 (four) times daily. Patient not taking: Reported on 12/08/2015 11/01/15   Debbe Odea, MD  dicyclomine (BENTYL) 10 MG capsule Take 1 capsule (10 mg total) by mouth 3 (three) times daily before meals. 07/30/15   Ladene Artist, MD  esomeprazole (NEXIUM) 40 MG capsule Take 40 mg by mouth daily at 12 noon.    Historical Provider, MD  fenofibrate 160 MG tablet Take 160 mg by mouth daily. 07/17/15   Historical Provider, MD  Fluticasone Furoate-Vilanterol (BREO ELLIPTA) 100-25 MCG/INH AEPB Inhale 1 puff into the lungs daily. 12/02/15   Juanito Doom, MD  furosemide (LASIX) 40 MG tablet Take 1 tablet (40 mg total) by mouth daily. 11/03/15   Belva Crome, MD  glucose blood test strip Use to test blood sugar 4 times daily instructed. Dx: E11.59 11/06/15   Philemon Kingdom, MD  insulin regular human CONCENTRATED (HUMULIN R) 500 UNIT/ML injection Inject 0.14-0.18 ml (draw to 14-18 units on the insulin syringe) (70-90 units of insulin) 3x a day, 30 min before meals 08/25/15   Philemon Kingdom, MD  Insulin Syringes, Disposable, U-100 1 ML MISC Use 4x a day 08/20/15   Philemon Kingdom, MD  isosorbide mononitrate (IMDUR) 30 MG 24 hr tablet Take 1 tablet (30 mg total) by mouth daily. 11/01/15   Debbe Odea, MD  metoprolol (LOPRESSOR) 50 MG tablet Take 1 tablet (50 mg total) by mouth 2 (two) times daily. 04/06/15   Imogene Burn, PA-C  nitroGLYCERIN (NITROSTAT) 0.4 MG SL tablet Place 1 tablet (0.4 mg total) under the tongue every 5 (five) minutes as needed for chest pain (CP or SOB). 01/31/15   Erlene Quan, PA-C  Polyvinyl Alcohol-Povidone (REFRESH OP) Place 1 drop into both eyes daily as needed (for dry eyes).    Historical Provider, MD  potassium chloride SA (K-DUR,KLOR-CON) 20  MEQ tablet Take 1 tablet (20 mEq total) by mouth daily. 09/21/15   Belva Crome, MD  prasugrel (EFFIENT) 10 MG TABS tablet Take 1 tablet (10 mg total) by mouth daily. 08/25/15   Belva Crome, MD  pravastatin (PRAVACHOL) 40 MG tablet Take 1 tablet (40 mg total) by mouth daily. 02/12/15   Rosalita Chessman, DO  propranolol (INDERAL) 40 MG tablet Take 1 tablet (40 mg total) by mouth 2 (two) times daily. 07/29/15   Donika K Patel, DO  VENTOLIN HFA 108 (90 BASE) MCG/ACT inhaler Inhale 2 puffs into the lungs every 6 (six) hours as needed. (  SHORTNESS OR BREATH / WHEEZING) 06/22/15   Historical Provider, MD   Scheduled Meds:   Continuous Infusions: . nitroGLYCERIN 10 mcg/min (12/18/15 0835)   PRN Meds:.  Allergies: No Known Allergies  Social History   Social History  . Marital Status: Married    Spouse Name: N/A  . Number of Children: 0  . Years of Education: N/A   Occupational History  . retired    Social History Main Topics  . Smoking status: Never Smoker   . Smokeless tobacco: Never Used  . Alcohol Use: No  . Drug Use: No  . Sexual Activity: No   Other Topics Concern  . 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   He lives alone.  His wife is living in [place] at this time.  They do not have children.   Highest level of education:  College graduate    Family History  Problem Relation Age of Onset  . CAD Brother     2 brothers - CABG  . Alzheimer's disease Sister   . CAD Sister   . Prostate cancer Brother   . Diabetes Other     entire family  . Asthma Brother     2 brothers    Family Status  Relation Status Death Age  . Brother Deceased   . Sister Deceased   . Sister Deceased   . Brother Deceased   . Brother Deceased   . Mother Deceased   . Father Deceased   . Maternal Grandfather Deceased   . Maternal Grandmother Deceased   . Paternal Grandmother Deceased   .  Paternal Grandfather Deceased   . Brother Alive     Review of Systems:   Full 14-point review of systems otherwise negative except as noted above.  Physical Exam: Blood pressure 220/69, pulse 62, resp. rate 14, height 5' 7.25" (1.708 m), weight 284 lb (128.822 kg), SpO2 100 %. General: Well developed, well nourished,male in no acute distress. Head: Normocephalic, atraumatic, sclera non-icteric, no xanthomas, nares are without discharge. Dentition:  Neck: No carotid bruits. JVD elevated at 9 cm w/ + hepatojugular reflux. No thyromegally Lungs: Good expansion bilaterally. without wheezes or rhonchi. Decreased BS bases. Heart: Regular rate and rhythm with S1 S2.  No S3 or S4.  No murmur, no rubs, or gallops appreciated. Abdomen: Soft, non-tender, non-distended with normoactive bowel sounds. No hepatomegaly. No rebound/guarding. No obvious abdominal masses. Msk:  Strength and tone appear normal for age. No joint deformities or effusions, no spine or costo-vertebral angle tenderness. Extremities: No clubbing or cyanosis. Trace LE edema.  Distal pedal pulses are 2+ in 4 extrem Neuro: Alert and oriented X 3. Moves all extremities spontaneously. No focal deficits noted. Psych:  Responds to questions appropriately with a normal affect. Skin: No rashes or lesions noted  Labs:   Lab Results  Component Value Date   WBC 6.5 12/18/2015   HGB 13.3 12/18/2015   HCT 40.9 12/18/2015   MCV 86.7 12/18/2015   PLT 246 12/18/2015   No results for input(s): INR in the last 72 hours.   Recent Labs Lab 12/18/15 0800  NA 142  K 4.1  CL 105  CO2 25  BUN 15  CREATININE 1.14  CALCIUM 9.4  GLUCOSE 162*   No results for input(s): CKTOTAL, CKMB, TROPONINI in the last 72 hours.  Recent Labs  12/18/15 [phone]  TROPIPOC 0.01   Lab Results  Component Value Date   CHOL 197 07/08/2015   HDL 40.50 07/08/2015   LDLCALC 100* 02/09/2015   TRIG 242.0* 07/08/2015   B NATRIURETIC PEPTIDE  Date/Time Value  Ref Range Status  10/31/2015 12:00 PM 31.1 0.0 - 100.0 pg/mL Final  06/04/2015 11:20 AM 35.1 0.0 - 100.0 pg/mL Final  03/21/2015 04:48 PM 13.8 0.0 - 100.0 pg/mL Final   Lab Results  Component Value Date   DDIMER <0.27 06/05/2015    Radiology/Studies: Dg Chest 2 View 12/18/2015  CLINICAL DATA:  Chest pain and shortness of breath EXAM: CHEST  2 VIEW COMPARISON:  Chest radiograph October 31, 2015 ; chest CT August 04, 2015 FINDINGS: There is no edema or consolidation. Heart is upper normal in size with pulmonary vascularity within normal limits. No adenopathy. There is mid thoracic dextroscoliosis. There is degenerative change in the thoracic spine. IMPRESSION: No edema or consolidation.  Heart upper normal in size. Electronically Signed   By: Lowella Grip III M.D.   On: 12/18/2015 07:43     Cardiac Cath: 01/30/2015 IMPRESSIONS: 1. Severe mid LAD stenosis/tandem with 85/60% stenosis reduced to 0% with TIMI grade 3 flow after 24 x 3.0 DES implantation [employer]). Perfusion abnormality on the recent myocardial perfusion study and progressive angina fit the clinical picture. 2. Widely patent circumflex and right coronary 3. Normal left ventricular systolic function with elevated end-diastolic pressure compatible with diastolic heart failure  Echo: 01/28/2014 - Left ventricle: The cavity size was normal. 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 Systolic function was normal. The estimated ejection fraction was in the range of 60% to 65%. Wall motion was normal; there were no regional wall motion abnormalities. Doppler parameters are consistent with abnormal left ventricular relaxation (grade 1 diastolic dysfunction). The E/e' ratio is <10, suggesting normal LV filling pressure. - Left atrium: The atrium was normal in size. - Right ventricle: The cavity size was normal. There is borderline RVH. Systolic function was  normal. - Right atrium: The atrium was at the upper limits of normal in size. - Pericardium, extracardiac: There was no pericardial effusion.  ECG: 12/18/2015 SR, 1st deg AV block  ASSESSMENT AND PLAN:  Principal Problem:   Unstable angina ([place]) - Pt says his usual angina, exertional dyspnea has been worsening this week - IV NTG and heparin - morphine prn - admit, if cannot get pain-free easily, urgent cath  Otherwise, continue home rx with SSI, ck lipid profile Active Problems:   Dyslipidemia, goal LDL below 70   HTN (hypertension)   Type 2 diabetes mellitus with other circulatory complications ([place])   GERD (gastroesophageal reflux disease)   Signed, Rosaria Ferries, PA-C 12/18/2015 8:46 AM Beeper [phone]

## 2015-12-18 NOTE — ED Notes (Signed)
Patient here via EMS from home. Awoke this AM @ 0500. Patient 2 of his own NTG 0.4 SL tabs and 81mg  of ASA. EMS arrived and administered additional ASA to equal 324mg , and an additional 2 NTG SL 0.4mg  tabs. Patient felt some relief of pain. Endorses fatigue over the past two months with activity explaining that he would feel chest pain, shortness of breath, and noted that he was very sweaty.

## 2015-12-18 NOTE — ED Notes (Signed)
MD at bedside. 

## 2015-12-18 NOTE — ED Notes (Signed)
Attempted report 

## 2015-12-19 LAB — GLUCOSE, CAPILLARY
GLUCOSE-CAPILLARY: 285 mg/dL — AB (ref 65–99)
Glucose-Capillary: 184 mg/dL — ABNORMAL HIGH (ref 65–99)
Glucose-Capillary: 284 mg/dL — ABNORMAL HIGH (ref 65–99)
Glucose-Capillary: 237 mg/dL — ABNORMAL HIGH (ref 65–99)

## 2015-12-19 LAB — COMPREHENSIVE METABOLIC PANEL
ALT: 18 U/L (ref 17–63)
AST: 15 U/L (ref 15–41)
Albumin: 3.1 g/dL — ABNORMAL LOW (ref 3.5–5.0)
Alkaline Phosphatase: 73 U/L (ref 38–126)
Anion gap: 7 (ref 5–15)
BUN: 17 mg/dL (ref 6–20)
CO2: 31 mmol/L (ref 22–32)
Calcium: 8.8 mg/dL — ABNORMAL LOW (ref 8.9–10.3)
Chloride: 101 mmol/L (ref 101–111)
Creatinine, Ser: 1.4 mg/dL — ABNORMAL HIGH (ref 0.61–1.24)
GFR calc Af Amer: 56 mL/min — ABNORMAL LOW (ref >60)
GFR calc non Af Amer: 48 mL/min — ABNORMAL LOW (ref >60)
Glucose, Bld: 209 mg/dL — ABNORMAL HIGH (ref 65–99)
Potassium: 4.3 mmol/L (ref 3.5–5.1)
Sodium: 139 mmol/L (ref 135–145)
Total Bilirubin: 0.5 mg/dL (ref 0.3–1.2)
Total Protein: 5.8 g/dL — ABNORMAL LOW (ref 6.5–8.1)

## 2015-12-19 LAB — HEPARIN LEVEL (UNFRACTIONATED): HEPARIN UNFRACTIONATED: 0.48 [IU]/mL (ref 0.30–0.70)

## 2015-12-19 LAB — TROPONIN I
Troponin I: <0.03 ng/mL (ref <0.031)
Troponin I: <0.03 ng/mL (ref <0.031)

## 2015-12-19 MED ORDER — HEPARIN (PORCINE) IN NACL 100-0.45 UNIT/ML-% IJ SOLN
1200.0000 [IU]/h | INTRAMUSCULAR | Status: DC
  Administered 2015-12-19 – 2015-12-20 (×2): 1350 [IU]/h via INTRAVENOUS
  Filled 2015-12-19 (×2): qty 250

## 2015-12-19 MED ORDER — CETYLPYRIDINIUM CHLORIDE 0.05 % MT LIQD
7.0000 mL | Freq: Two times a day (BID) | OROMUCOSAL | Status: DC
  Administered 2015-12-19 – 2015-12-24 (×11): 7 mL via OROMUCOSAL

## 2015-12-19 NOTE — Plan of Care (Signed)
Problem: Pain Managment: Goal: General experience of comfort will improve Outcome: Progressing Patient has been more at a tolerable pain level and is asking for his medications as needed, instructed on the importance of keeping ahead of the pain and that he won't think about it if his body isn't experiencing pain so he will start to ask instead of trying to deal with the pain, will continue to monitor

## 2015-12-19 NOTE — Progress Notes (Signed)
Nutrition Brief Note  Patient identified on the Malnutrition Screening Tool (MST) Report  Wt Readings from Last 15 Encounters:  12/19/15 284 lb 4.8 oz (128.958 kg)  12/08/15 289 lb 3.2 oz (131.18 kg)  12/07/15 288 lb 12.8 oz (131 kg)  11/30/15 288 lb (130.636 kg)  11/01/15 285 lb 11.5 oz (129.6 kg)  10/19/15 288 lb 12.8 oz (130.999 kg)  10/18/15 270 lb (122.471 kg)  08/27/15 274 lb 3.2 oz (124.376 kg)  08/25/15 273 lb 9.6 oz (124.104 kg)  08/20/15 275 lb 3.2 oz (124.83 kg)  07/29/15 262 lb (118.842 kg)  07/27/15 263 lb (119.296 kg)  07/17/15 270 lb 12.8 oz (122.834 kg)  07/08/15 268 lb (121.564 kg)  06/22/15 268 lb (121.564 kg)    Body mass index is 44.52 kg/(m^2). Patient meets criteria for morbidly obese based on current BMI. Pt states that since he was recently started on new insulin regimen his HgbA1c dropped from 11.4-8.3% but that he also gained 20-25 lbs despite appetite and intakes remaining constant.   Current diet order is Heart Healthy/Carb Modified, patient is consuming approximately 100% of meals at this time. Labs and medications reviewed.   No nutrition interventions warranted at this time. If nutrition issues arise, please consult RD.      [name], RD, LDN Inpatient Clinical Dietitian Pager # [phone] After hours/weekend pager # [phone]

## 2015-12-19 NOTE — Plan of Care (Signed)
Problem: Safety: Goal: Ability to remain free from injury will improve Outcome: Progressing Reviewed with patient the importance of staying in bed and using the call light for assistance, all personal things and drinks, tissues on bedside stand within patient's reach and siderails up x 2 to aid in repositioning, instructed on white board and how to call RN or NT to assist him when needed and patient verbalized understanding and has been compliant with the bed alarm being on, will continue to monitor.

## 2015-12-19 NOTE — Progress Notes (Signed)
Report received via Data processing manager format, reviewed orders, labs, VS, meds and patient's general condition, assumed care of patient.

## 2015-12-19 NOTE — Plan of Care (Signed)
Problem: Bowel/Gastric: Goal: Will not experience complications related to bowel motility Outcome: Progressing Patient given MOM to help with some slight constipation issues, will continue to monitor.

## 2015-12-19 NOTE — Progress Notes (Addendum)
ANTICOAGULATION CONSULT NOTE - Initial Consult  Pharmacy Consult for heparin  Indication: chest pain/ACS  No Known Allergies  Patient Measurements: Height: '5\' 7"'  (170.2 cm) Weight: 284 lb 4.8 oz (128.958 kg) IBW/kg (Calculated) : 66.1 Heparin Dosing Weight:  97kg  Vital Signs: Temp: 97.4 F (36.3 C) (02/18 1112) Temp Source: Oral (02/18 1112) BP: 167/83 mmHg (02/18 1110) Pulse Rate: 62 (02/18 1009)  Labs:  Recent Labs  12/18/15 0800 12/18/15 1830 12/18/15 2356 12/19/15 0545  HGB 13.3  --   --   --   HCT 40.9  --   --   --   PLT 246  --   --   --   LABPROT  --  13.9  --   --   INR  --  1.05  --   --   CREATININE 1.14  --   --  1.40*  TROPONINI  --  <0.03 <0.03 <0.03    Estimated Creatinine Clearance: 60.7 mL/min (by C-G formula based on Cr of 1.4).   Medical History: Past Medical History  Diagnosis Date  . Hypertension   . CAD (coronary artery disease)     a. Pt reports "small heart attack" in 2013 at [facility], denies need for stent or CABG, does not know findings of cath.  Marland Kitchen TIA (transient ischemic attack) 2002  . Dyslipidemia, goal LDL below 70 01/29/2014  . GERD (gastroesophageal reflux disease)   . Enlarged prostate   . Shortness of breath     exertion  . DM type 2 (diabetes mellitus, type 2), insulin dependent 01/29/2014    fasting cbg 50-120 with new regimen  . Anxiety   . Heart attack ([place]) 02/03/2014    "mild heart attack"  . Pneumonia 01/2013  . Pneumonia 03/2014    hospitalized  . Sleep apnea     does not use cpap (06/04/2015)  . BSJGGEZM(629.4)     "monthly" (06/04/2015)  . Arthritis     'all over"  . Tremor of both hands   . Adenomatous colon polyp 04/2011  . Diverticulosis   . Internal hemorrhoid   . CHF (congestive heart failure) (HCC)     Medications:  Prescriptions prior to admission  Medication Sig Dispense Refill Last Dose  . amLODipine (NORVASC) 10 MG tablet Take 10 mg by mouth daily.  1 12/17/2015 at Unknown time  .  aspirin EC 81 MG tablet Take 81 mg by mouth daily.   12/18/2015 at Unknown time  . Blood Glucose Monitoring Suppl (ONE TOUCH ULTRA SYSTEM KIT) W/DEVICE KIT Use to test blood sugar daily as instructed. Dx code: E11.59 1 each 0 Taking  . Ciclopirox 1 % shampoo Apply 1 application topically at bedtime. At night after showering  0 12/17/2015 at Unknown time  . clonazePAM (KLONOPIN) 1 MG tablet Take 1 tablet (1 mg total) by mouth at bedtime as needed for anxiety. 30 tablet 2 12/17/2015 at Unknown time  . dextromethorphan-guaiFENesin (MUCINEX DM) 30-600 MG 12hr tablet Take 1 tablet by mouth 2 (two) times daily. 30 tablet 0 Past Month at Unknown time  . diclofenac sodium (VOLTAREN) 1 % GEL Apply 2 g topically 4 (four) times daily. 100 g 0 Past Week at Unknown time  . dicyclomine (BENTYL) 10 MG capsule Take 1 capsule (10 mg total) by mouth 3 (three) times daily before meals. 90 capsule 5 12/17/2015 at Unknown time  . esomeprazole (NEXIUM) 40 MG capsule Take 40 mg by mouth daily at 12 noon.  12/17/2015 at Unknown time  . fenofibrate 160 MG tablet Take 160 mg by mouth daily.  0 12/17/2015 at Unknown time  . Fluticasone Furoate-Vilanterol (BREO ELLIPTA) 100-25 MCG/INH AEPB Inhale 1 puff into the lungs daily. 60 each 5 12/17/2015 at Unknown time  . furosemide (LASIX) 40 MG tablet Take 1 tablet (40 mg total) by mouth daily. 90 tablet 2 12/17/2015 at Unknown time  . glucose blood test strip Use to test blood sugar 4 times daily instructed. Dx: E11.59 125 each 5 Taking  . insulin NPH-regular Human (NOVOLIN 70/30) (70-30) 100 UNIT/ML injection Inject 40-60 Units into the skin 2 (two) times daily with a meal. 40 units in the morning, 60 units in the evening   12/17/2015 at Unknown time  . insulin regular (NOVOLIN R,HUMULIN R) 100 units/mL injection Inject 40 Units into the skin 3 (three) times daily before meals.   12/17/2015 at Unknown time  . Insulin Syringes, Disposable, U-100 1 ML MISC Use 4x a day 200 each 11 Taking  .  isosorbide mononitrate (IMDUR) 30 MG 24 hr tablet Take 1 tablet (30 mg total) by mouth daily. 30 tablet 0 12/17/2015 at Unknown time  . metoprolol (LOPRESSOR) 50 MG tablet Take 1 tablet (50 mg total) by mouth 2 (two) times daily. 60 tablet 6 12/17/2015 at 180  . nitroGLYCERIN (NITROSTAT) 0.4 MG SL tablet Place 1 tablet (0.4 mg total) under the tongue every 5 (five) minutes as needed for chest pain (CP or SOB). 25 tablet 4 12/18/2015 at Unknown time  . Polyvinyl Alcohol-Povidone (REFRESH OP) Place 1 drop into both eyes daily as needed (for dry eyes).   12/17/2015 at Unknown time  . potassium chloride SA (K-DUR,KLOR-CON) 20 MEQ tablet Take 1 tablet (20 mEq total) by mouth daily. 30 tablet 11 12/17/2015 at Unknown time  . prasugrel (EFFIENT) 10 MG TABS tablet Take 1 tablet (10 mg total) by mouth daily. 30 tablet 6 12/18/2015 at Unknown time  . pravastatin (PRAVACHOL) 40 MG tablet Take 1 tablet (40 mg total) by mouth daily. 30 tablet 5 12/17/2015 at Unknown time  . propranolol (INDERAL) 40 MG tablet Take 1 tablet (40 mg total) by mouth 2 (two) times daily. 60 tablet 11 12/17/2015 at 1800  . VENTOLIN HFA 108 (90 BASE) MCG/ACT inhaler Inhale 2 puffs into the lungs every 6 (six) hours as needed. (SHORTNESS OR BREATH / WHEEZING)  0 12/17/2015 at Unknown time   Scheduled:  . amLODipine  10 mg Oral Daily  . aspirin EC  81 mg Oral Daily  . budesonide-formoterol  2 puff Inhalation BID  . dicyclomine  10 mg Oral TID AC  . feeding supplement (ENSURE ENLIVE)  237 mL Oral BID BM  . fenofibrate  160 mg Oral Daily  . furosemide  40 mg Oral Daily  . insulin aspart  0-15 Units Subcutaneous TID WC  . insulin aspart  0-5 Units Subcutaneous QHS  . insulin aspart protamine- aspart  30 Units Subcutaneous Q supper  . metoprolol  50 mg Oral BID  . pantoprazole  40 mg Oral Daily  . potassium chloride SA  20 mEq Oral Daily  . prasugrel  10 mg Oral Daily  . pravastatin  40 mg Oral Daily  . propranolol  40 mg Oral BID  . sodium  chloride flush  3 mL Intravenous Q12H    Assessment: 75 yo male with CP (hx CAD with DES in 01/2015) to begin heparin for r/o ACS. Noted plans for LHC/RHC.  -2/17: hg=  13.3, plt= 246 -He was on lovenox with last dose (65 mg) on 2/17 at ~ 9pm  Goal of Therapy:  Heparin level 0.3-0.7 units/ml Monitor platelets by anticoagulation protocol: Yes   Plan:  -Heparin 1350 units/hr (~ 14 units/kg/hr) -Heparin level in 6 hours and daily wth CBC daily -Will follow cath plans  Hildred Laser, Pharm D 12/19/2015 11:25 AM  Addendum -Heparin level=  0.48  Plan -No heparin changes needed -Heparin level and CBC in am  Hildred Laser, Pharm D 12/19/2015 7:10 PM

## 2015-12-19 NOTE — Progress Notes (Signed)
Patient ID: Edward [name], male   DOB: 1941/10/03, 75 y.o.   MRN: [id number]     Subjective:    Some chest pain overnight, but improved this AM  Objective:   Temp:  [97.2 F (36.2 C)-97.8 F (36.6 C)] 97.7 F (36.5 C) (02/18 0700) Pulse Rate:  [52-85] 62 (02/18 1009) Resp:  [12-22] 16 (02/18 1009) BP: (101-164)/(49-85) 153/57 mmHg (02/18 0940) SpO2:  [94 %-100 %] 99 % (02/18 1009) Weight:  [284 lb 4.8 oz (128.958 kg)-284 lb 8 oz (129.048 kg)] 284 lb 4.8 oz (128.958 kg) (02/18 0400) Last BM Date: 12/19/15  Filed Weights   12/18/15 0837 12/18/15 1800 12/19/15 0400  Weight: 284 lb (128.822 kg) 284 lb 8 oz (129.048 kg) 284 lb 4.8 oz (128.958 kg)    Intake/Output Summary (Last 24 hours) at 12/19/15 1032 Last data filed at 12/19/15 [id number]  Gross per 24 hour  Intake 1274.9 ml  Output   1575 ml  Net -300.1 ml    Telemetry: NSR  Exam:  General: NAD  HEENT: sclera clear, throat clear  Resp: CTAB  Cardiac: RRR, no m/r/g, no jvd  GI: abdomen soft, NT, ND  MSK: trace bilateral edema  Neuro: no focal deficits  Psych: appropriate affect  Lab Results:  Basic Metabolic Panel:  Recent Labs Lab 12/18/15 0800 12/19/15 0545  NA 142 139  K 4.1 4.3  CL 105 101  CO2 25 31  GLUCOSE 162* 209*  BUN 15 17  CREATININE 1.14 1.40*  CALCIUM 9.4 8.8*    Liver Function Tests:  Recent Labs Lab 12/19/15 0545  AST 15  ALT 18  ALKPHOS 73  BILITOT 0.5  PROT 5.8*  ALBUMIN 3.1*    CBC:  Recent Labs Lab 12/18/15 0800  WBC 6.5  HGB 13.3  HCT 40.9  MCV 86.7  PLT 246    Cardiac Enzymes:  Recent Labs Lab 12/18/15 1830 12/18/15 2356 12/19/15 0545  TROPONINI <0.03 <0.03 <0.03    BNP:  Recent Labs  07/17/15 1139 07/24/15 0841  PROBNP 35.0 24.0    Coagulation:  Recent Labs Lab 12/18/15 1830  INR 1.05    ECG:   Medications:   Scheduled Medications: . amLODipine  10 mg Oral Daily  . aspirin EC  81 mg Oral Daily  . budesonide-formoterol  2  puff Inhalation BID  . dicyclomine  10 mg Oral TID AC  . enoxaparin (LOVENOX) injection  0.5 mg/kg Subcutaneous Q24H  . feeding supplement (ENSURE ENLIVE)  237 mL Oral BID BM  . fenofibrate  160 mg Oral Daily  . furosemide  40 mg Oral Daily  . insulin aspart  0-15 Units Subcutaneous TID WC  . insulin aspart  0-5 Units Subcutaneous QHS  . insulin aspart protamine- aspart  30 Units Subcutaneous Q supper  . metoprolol  50 mg Oral BID  . pantoprazole  40 mg Oral Daily  . potassium chloride SA  20 mEq Oral Daily  . prasugrel  10 mg Oral Daily  . pravastatin  40 mg Oral Daily  . propranolol  40 mg Oral BID  . sodium chloride flush  3 mL Intravenous Q12H     Infusions: . nitroGLYCERIN 15 mcg/min (12/19/15 0500)     PRN Medications:  sodium chloride, acetaminophen, albuterol, ALPRAZolam, clonazePAM, gi cocktail, magnesium hydroxide, morphine injection, nitroGLYCERIN, ondansetron (ZOFRAN) IV, polyvinyl alcohol, sodium chloride flush, zolpidem     Assessment/Plan    1. Chest pain - hx of CAD with prior DES to LAD  01/2015 - patient reports progression of his chronic angina pain, increased DOE over the last several months.  - trop neg x 3, EKG without specific ischemic changes - echo 12/2015 LVEF 60-65%, no WMAs, grade I diastolic dysfunction - [id number] with apical scar, no current ischemia. Low risk test.  - from Dr Thompson Caul notes ongoing chest pain and dyspnea for several months. Not improved with changing brillinta to effient. Dyspnea not improved with diuretic. Seen by pulm for dyspnea, thought to be secondary to obesity and deconditioning, though some suggestion of interstitial lung disease by previous CT and PFTs though thought less likely - will plan for LHC/RHC, RHC given his ongoing dyspnea and inability to measure pulm pressures by echo. His obesity, OSA do put him at risk for pulm HTN.  - patient is very interested in invasive testing as his ongoing symptoms continue to cause him  great anxiety about his heart.     Carlyle Dolly, MD

## 2015-12-20 LAB — URINE MICROSCOPIC-ADD ON

## 2015-12-20 LAB — GLUCOSE, CAPILLARY
GLUCOSE-CAPILLARY: 201 mg/dL — AB (ref 65–99)
GLUCOSE-CAPILLARY: 231 mg/dL — AB (ref 65–99)
Glucose-Capillary: 199 mg/dL — ABNORMAL HIGH (ref 65–99)
Glucose-Capillary: 309 mg/dL — ABNORMAL HIGH (ref 65–99)

## 2015-12-20 LAB — BASIC METABOLIC PANEL
Anion gap: 11 (ref 5–15)
BUN: 18 mg/dL (ref 6–20)
CALCIUM: 9.3 mg/dL (ref 8.9–10.3)
CO2: 27 mmol/L (ref 22–32)
Chloride: 99 mmol/L — ABNORMAL LOW (ref 101–111)
Creatinine, Ser: 1.31 mg/dL — ABNORMAL HIGH (ref 0.61–1.24)
GFR calc Af Amer: >60 mL/min (ref >60)
GFR, EST NON AFRICAN AMERICAN: 52 mL/min — AB (ref >60)
GLUCOSE: 237 mg/dL — AB (ref 65–99)
POTASSIUM: 4 mmol/L (ref 3.5–5.1)
Sodium: 137 mmol/L (ref 135–145)

## 2015-12-20 LAB — URINALYSIS, ROUTINE W REFLEX MICROSCOPIC
Bilirubin Urine: NEGATIVE
Glucose, UA: NEGATIVE mg/dL
KETONES UR: 15 mg/dL — AB
NITRITE: NEGATIVE
PROTEIN: 30 mg/dL — AB
Specific Gravity, Urine: 1.02 (ref 1.005–1.030)
pH: 6 (ref 5.0–8.0)

## 2015-12-20 LAB — CBC
HCT: 36 % — ABNORMAL LOW (ref 39.0–52.0)
HEMOGLOBIN: 11.5 g/dL — AB (ref 13.0–17.0)
MCH: 27.8 pg (ref 26.0–34.0)
MCHC: 31.9 g/dL (ref 30.0–36.0)
MCV: 87 fL (ref 78.0–100.0)
Platelets: 208 10*3/uL (ref 150–400)
RBC: 4.14 MIL/uL — AB (ref 4.22–5.81)
RDW: 13.3 % (ref 11.5–15.5)
WBC: 5.8 10*3/uL (ref 4.0–10.5)

## 2015-12-20 LAB — HEPARIN LEVEL (UNFRACTIONATED): HEPARIN UNFRACTIONATED: 0.89 [IU]/mL — AB (ref 0.30–0.70)

## 2015-12-20 MED ORDER — SODIUM CHLORIDE 0.9 % WEIGHT BASED INFUSION
1.0000 mL/kg/h | INTRAVENOUS | Status: DC
  Administered 2015-12-20 – 2015-12-21 (×2): 1 mL/kg/h via INTRAVENOUS

## 2015-12-20 MED ORDER — INSULIN ASPART PROT & ASPART (70-30 MIX) 100 UNIT/ML ~~LOC~~ SUSP
20.0000 [IU] | Freq: Once | SUBCUTANEOUS | Status: AC
  Administered 2015-12-20: 20 [IU] via SUBCUTANEOUS

## 2015-12-20 MED ORDER — INSULIN ASPART PROT & ASPART (70-30 MIX) 100 UNIT/ML ~~LOC~~ SUSP
30.0000 [IU] | Freq: Two times a day (BID) | SUBCUTANEOUS | Status: DC
  Administered 2015-12-22 – 2015-12-24 (×5): 30 [IU] via SUBCUTANEOUS

## 2015-12-20 NOTE — Progress Notes (Signed)
Pt urine is blood tinged, Edward Bayley, NP notified and orders received to obtain UA.  I spoke with Mitzi Hansen the pharmacist on the floor and he advised me to stop the heparin and speak to Dr. Harl Bowie.  Dr. Harl Bowie on the floor and notified of pt urine and is okay with stopping heparin for now.  Will continue to closely monitor.

## 2015-12-20 NOTE — Progress Notes (Signed)
ANTICOAGULATION CONSULT NOTE - Follow Up Consult  Pharmacy Consult for Heparin  Indication: chest pain/ACS  No Known Allergies  Patient Measurements: Height: 5\' 7"  (170.2 cm) Weight: 284 lb 9.8 oz (129.1 kg) IBW/kg (Calculated) : 66.1  Vital Signs: Temp: 97.8 F (36.6 C) (02/19 0400) Temp Source: Oral (02/19 0400) BP: 124/52 mmHg (02/19 0400) Pulse Rate: 62 (02/19 0400)  Labs:  Recent Labs  12/18/15 0800 12/18/15 1830 12/18/15 2356 12/19/15 0545 12/19/15 1802 12/20/15 0315  HGB 13.3  --   --   --   --  11.5*  HCT 40.9  --   --   --   --  36.0*  PLT 246  --   --   --   --  208  LABPROT  --  13.9  --   --   --   --   INR  --  1.05  --   --   --   --   HEPARINUNFRC  --   --   --   --  0.48 0.89*  CREATININE 1.14  --   --  1.40*  --   --   TROPONINI  --  <0.03 <0.03 <0.03  --   --     Estimated Creatinine Clearance: 60.7 mL/min (by C-G formula based on Cr of 1.4).  Assessment: Heparin for CP, planning for LHC/RHC, heparin level is supra-therapeutic this AM  Goal of Therapy:  Heparin level 0.3-0.7 units/ml Monitor platelets by anticoagulation protocol: Yes   Plan:  -Decrease heparin to 1200 units/hr -1300 HL  Narda Bonds 12/20/2015,5:42 AM

## 2015-12-20 NOTE — Progress Notes (Signed)
Patient ID: SUZANNE MARSO, male   DOB: 1940/12/03, 75 y.o.   MRN: [id number]     Subjective:    Mild pain overnight, occasional SOB  Objective:   Temp:  [97.4 F (36.3 C)-98.2 F (36.8 C)] 98.1 F (36.7 C) (02/19 0805) Pulse Rate:  [60-62] 60 (02/19 0805) Resp:  [14-22] 20 (02/19 0805) BP: (119-167)/(48-83) 142/61 mmHg (02/19 0805) SpO2:  [96 %-99 %] 99 % (02/19 0805) Weight:  [284 lb 9.8 oz (129.1 kg)] 284 lb 9.8 oz (129.1 kg) (02/19 0400) Last BM Date: 12/20/15  Filed Weights   12/18/15 1800 12/19/15 0400 12/20/15 0400  Weight: 284 lb 8 oz (129.048 kg) 284 lb 4.8 oz (128.958 kg) 284 lb 9.8 oz (129.1 kg)    Intake/Output Summary (Last 24 hours) at 12/20/15 0958 Last data filed at 12/20/15 0708  Gross per 24 hour  Intake 700.95 ml  Output   1150 ml  Net -449.05 ml    Telemetry: NSR  Exam:  General: NAD  HEENT: sclera clear, throat clear  Resp: CTAB  Cardiac: RRR, no m/r/g, no jvd  GI: abdomen soft, NT, ND  MSK: trace bilateral LE edema  Neuro: no focal deficits  Psych: appropriate affect  Lab Results:  Basic Metabolic Panel:  Recent Labs Lab 12/18/15 0800 12/19/15 0545  NA 142 139  K 4.1 4.3  CL 105 101  CO2 25 31  GLUCOSE 162* 209*  BUN 15 17  CREATININE 1.14 1.40*  CALCIUM 9.4 8.8*    Liver Function Tests:  Recent Labs Lab 12/19/15 0545  AST 15  ALT 18  ALKPHOS 73  BILITOT 0.5  PROT 5.8*  ALBUMIN 3.1*    CBC:  Recent Labs Lab 12/18/15 0800 12/20/15 0315  WBC 6.5 5.8  HGB 13.3 11.5*  HCT 40.9 36.0*  MCV 86.7 87.0  PLT 246 208    Cardiac Enzymes:  Recent Labs Lab 12/18/15 1830 12/18/15 2356 12/19/15 0545  TROPONINI <0.03 <0.03 <0.03    BNP:  Recent Labs  07/17/15 1139 07/24/15 0841  PROBNP 35.0 24.0    Coagulation:  Recent Labs Lab 12/18/15 1830  INR 1.05    ECG:   Medications:   Scheduled Medications: . amLODipine  10 mg Oral Daily  . antiseptic oral rinse  7 mL Mouth Rinse BID  .  aspirin EC  81 mg Oral Daily  . budesonide-formoterol  2 puff Inhalation BID  . dicyclomine  10 mg Oral TID AC  . feeding supplement (ENSURE ENLIVE)  237 mL Oral BID BM  . fenofibrate  160 mg Oral Daily  . furosemide  40 mg Oral Daily  . insulin aspart  0-15 Units Subcutaneous TID WC  . insulin aspart  0-5 Units Subcutaneous QHS  . insulin aspart protamine- aspart  30 Units Subcutaneous Q supper  . metoprolol  50 mg Oral BID  . pantoprazole  40 mg Oral Daily  . potassium chloride SA  20 mEq Oral Daily  . prasugrel  10 mg Oral Daily  . pravastatin  40 mg Oral Daily  . propranolol  40 mg Oral BID  . sodium chloride flush  3 mL Intravenous Q12H     Infusions: . heparin Stopped (12/20/15 0850)  . nitroGLYCERIN 20 mcg/min (12/20/15 0350)     PRN Medications:  sodium chloride, acetaminophen, albuterol, ALPRAZolam, clonazePAM, gi cocktail, magnesium hydroxide, morphine injection, nitroGLYCERIN, ondansetron (ZOFRAN) IV, polyvinyl alcohol, sodium chloride flush, zolpidem     Assessment/Plan    1. Chest pain -  hx of CAD with prior DES to LAD 01/2015 - patient reports progression of his chronic angina pain, increased DOE over the last several months.  - trop neg x 3, EKG without specific ischemic changes - echo 12/2015 LVEF 60-65%, no WMAs, grade I diastolic dysfunction - [id number] with apical scar, no current ischemia. Low risk test.  - from Dr Thompson Caul notes ongoing chest pain and dyspnea for several months. Not improved with changing brillinta to effient. Dyspnea not improved with diuretic. Seen by pulm for dyspnea, thought to be secondary to obesity and deconditioning, though some suggestion of interstitial lung disease by previous CT and PFTs though thought less likely - will plan for LHC/RHC, RHC given his ongoing dyspnea and inability to measure pulm pressures by echo. His obesity, OSA do put him at risk for pulm HTN.  - patient is very interested in invasive testing as his ongoing  symptoms continue to cause him great anxiety about his heart.   - f/u labs, pending Cr may need some IVFs in preperation for cath. Hold lasix as last Cr slightly above baseline. Plan for RHC/LHC tomorrow.   2. Hematuria - started after heparin drip, UA with +RBCs. Hgb down from admission but close to baseline about 1 month ago of 12.  - follow H&H off heparin gtt. He has historically tolerated DAPT well without hematuria.        Carlyle Dolly, M.D.

## 2015-12-20 NOTE — Progress Notes (Signed)
Updates received via Melissa RN in patient's room using SBAR format, reviewed new orders, VS, and patient's general condition, assumed care of patient.

## 2015-12-20 NOTE — Progress Notes (Signed)
VSS and patient is resting quietly at this time and has been dosing since he received his Ambien 5 mg prior to his CP episode, no other changes noted at this time, will continue to monitor.

## 2015-12-20 NOTE — Progress Notes (Signed)
Patient got OOB, with assistance, to go to the BR, and on the way back, started getting SOB and a little diaphoretic, he also started to c/o chest tightness, EKG done, increased his NTG drip to 20 mcgs/min from 15, placed him on 2l N/C O2 and gave him 4 mg Morphine slow IVPfor a pain rating of 8/10, alerted the charge RN and after reviewing the EKG and checking patient, determined that there were no changes and we would continue to monitor. Patient is now on a Heparin drip at 13.5cc/hr or 1350 units/hr, will continue to monitor.

## 2015-12-21 ENCOUNTER — Encounter (HOSPITAL_COMMUNITY)
Admission: EM | Disposition: A | Discharge status: Home Health | Payer: Self-pay | Source: Home / Self Care | Attending: Cardiovascular Disease

## 2015-12-21 ENCOUNTER — Other Ambulatory Visit: Payer: Self-pay | Admitting: Physician Assistant

## 2015-12-21 DIAGNOSIS — I25110 Atherosclerotic heart disease of native coronary artery with unstable angina pectoris: Principal | ICD-10-CM

## 2015-12-21 DIAGNOSIS — I10 Essential (primary) hypertension: Secondary | ICD-10-CM

## 2015-12-21 HISTORY — PX: CARDIAC CATHETERIZATION: SHX172

## 2015-12-21 LAB — POCT I-STAT 3, VENOUS BLOOD GAS (G3P V)
Acid-Base Excess: 3 mmol/L — ABNORMAL HIGH (ref 0.0–2.0)
BICARBONATE: 28.5 meq/L — AB (ref 20.0–24.0)
O2 SAT: 61 %
PCO2 VEN: 48.5 mmHg (ref 45.0–50.0)
PO2 VEN: 33 mmHg (ref 30.0–45.0)
TCO2: 30 mmol/L (ref 0–100)
pH, Ven: 7.377 — ABNORMAL HIGH (ref 7.250–7.300)

## 2015-12-21 LAB — GLUCOSE, CAPILLARY
GLUCOSE-CAPILLARY: 267 mg/dL — AB (ref 65–99)
GLUCOSE-CAPILLARY: 215 mg/dL — AB (ref 65–99)
GLUCOSE-CAPILLARY: 160 mg/dL — AB (ref 65–99)
Glucose-Capillary: 237 mg/dL — ABNORMAL HIGH (ref 65–99)
Glucose-Capillary: 350 mg/dL — ABNORMAL HIGH (ref 65–99)

## 2015-12-21 LAB — POCT I-STAT 3, ART BLOOD GAS (G3+)
ACID-BASE EXCESS: 4 mmol/L — AB (ref 0.0–2.0)
BICARBONATE: 30 meq/L — AB (ref 20.0–24.0)
O2 SAT: 93 %
TCO2: 31 mmol/L (ref 0–100)
pCO2 arterial: 48.8 mmHg — ABNORMAL HIGH (ref 35.0–45.0)
pH, Arterial: 7.397 (ref 7.350–7.450)
pO2, Arterial: 69 mmHg — ABNORMAL LOW (ref 80.0–100.0)

## 2015-12-21 LAB — BASIC METABOLIC PANEL
ANION GAP: 7 (ref 5–15)
BUN: 18 mg/dL (ref 6–20)
CHLORIDE: 101 mmol/L (ref 101–111)
CO2: 28 mmol/L (ref 22–32)
CREATININE: 1.36 mg/dL — AB (ref 0.61–1.24)
Calcium: 8.6 mg/dL — ABNORMAL LOW (ref 8.9–10.3)
GFR calc non Af Amer: 50 mL/min — ABNORMAL LOW (ref >60)
GFR, EST AFRICAN AMERICAN: 58 mL/min — AB (ref >60)
Glucose, Bld: 294 mg/dL — ABNORMAL HIGH (ref 65–99)
Potassium: 4.2 mmol/L (ref 3.5–5.1)
SODIUM: 136 mmol/L (ref 135–145)

## 2015-12-21 LAB — CBC
HCT: 34.5 % — ABNORMAL LOW (ref 39.0–52.0)
HEMATOCRIT: 36.5 % — AB (ref 39.0–52.0)
HEMOGLOBIN: 11.4 g/dL — AB (ref 13.0–17.0)
HEMOGLOBIN: 11.9 g/dL — AB (ref 13.0–17.0)
MCH: 28.8 pg (ref 26.0–34.0)
MCH: 28.3 pg (ref 26.0–34.0)
MCHC: 33 g/dL (ref 30.0–36.0)
MCHC: 32.6 g/dL (ref 30.0–36.0)
MCV: 87.1 fL (ref 78.0–100.0)
MCV: 86.9 fL (ref 78.0–100.0)
Platelets: 204 10*3/uL (ref 150–400)
Platelets: 190 10*3/uL (ref 150–400)
RBC: 3.96 MIL/uL — ABNORMAL LOW (ref 4.22–5.81)
RBC: 4.2 MIL/uL — AB (ref 4.22–5.81)
RDW: 13 % (ref 11.5–15.5)
RDW: 13.2 % (ref 11.5–15.5)
WBC: 5.1 10*3/uL (ref 4.0–10.5)
WBC: 6.1 10*3/uL (ref 4.0–10.5)

## 2015-12-21 LAB — CREATININE, SERUM
CREATININE: 1.25 mg/dL — AB (ref 0.61–1.24)
GFR calc Af Amer: >60 mL/min (ref >60)
GFR calc non Af Amer: 55 mL/min — ABNORMAL LOW (ref >60)

## 2015-12-21 LAB — POCT ACTIVATED CLOTTING TIME
Activated Clotting Time: 302 seconds
Activated Clotting Time: 183 seconds

## 2015-12-21 LAB — PROTIME-INR
INR: 1.1 (ref 0.00–1.49)
PROTHROMBIN TIME: 14.4 s (ref 11.6–15.2)

## 2015-12-21 SURGERY — RIGHT/LEFT HEART CATH AND CORONARY ANGIOGRAPHY
Anesthesia: LOCAL

## 2015-12-21 MED ORDER — ENOXAPARIN SODIUM 40 MG/0.4ML ~~LOC~~ SOLN
40.0000 mg | SUBCUTANEOUS | Status: DC
  Administered 2015-12-22 – 2015-12-24 (×3): 40 mg via SUBCUTANEOUS
  Filled 2015-12-21 (×3): qty 0.4

## 2015-12-21 MED ORDER — HEPARIN (PORCINE) IN NACL 2-0.9 UNIT/ML-% IJ SOLN
INTRAMUSCULAR | Status: AC
  Filled 2015-12-21: qty 1000

## 2015-12-21 MED ORDER — SODIUM CHLORIDE 0.9 % IV SOLN: 250.0000 mL | INTRAVENOUS | Status: DC | PRN

## 2015-12-21 MED ORDER — FUROSEMIDE 10 MG/ML IJ SOLN
60.0000 mg | Freq: Once | INTRAMUSCULAR | Status: AC
  Administered 2015-12-21: 60 mg via INTRAVENOUS
  Filled 2015-12-21: qty 6

## 2015-12-21 MED ORDER — FENTANYL CITRATE (PF) 100 MCG/2ML IJ SOLN
INTRAMUSCULAR | Status: DC | PRN
  Administered 2015-12-21 (×2): 25 ug via INTRAVENOUS

## 2015-12-21 MED ORDER — MIDAZOLAM HCL 2 MG/2ML IJ SOLN
INTRAMUSCULAR | Status: AC
  Filled 2015-12-21: qty 2

## 2015-12-21 MED ORDER — VERAPAMIL HCL 2.5 MG/ML IV SOLN
INTRAVENOUS | Status: AC
  Filled 2015-12-21: qty 2

## 2015-12-21 MED ORDER — LIDOCAINE HCL (PF) 1 % IJ SOLN
INTRAMUSCULAR | Status: AC
Start: 2015-12-21 — End: 2015-12-21
  Filled 2015-12-21: qty 30

## 2015-12-21 MED ORDER — ONDANSETRON HCL 4 MG/2ML IJ SOLN
4.0000 mg | Freq: Four times a day (QID) | INTRAMUSCULAR | Status: DC | PRN
  Administered 2015-12-22 – 2015-12-23 (×2): 4 mg via INTRAVENOUS
  Filled 2015-12-21 (×2): qty 2

## 2015-12-21 MED ORDER — SODIUM CHLORIDE 0.9% FLUSH: 3.0000 mL | INTRAVENOUS | Status: DC | PRN

## 2015-12-21 MED ORDER — HEPARIN SODIUM (PORCINE) 1000 UNIT/ML IJ SOLN
INTRAMUSCULAR | Status: DC | PRN
  Administered 2015-12-21: 5000 [IU] via INTRAVENOUS
  Administered 2015-12-21: 2500 [IU] via INTRAVENOUS

## 2015-12-21 MED ORDER — ASPIRIN 81 MG PO CHEW
81.0000 mg | CHEWABLE_TABLET | Freq: Every day | ORAL | Status: DC
  Administered 2015-12-22 – 2015-12-24 (×3): 81 mg via ORAL
  Filled 2015-12-21 (×3): qty 1

## 2015-12-21 MED ORDER — OXYCODONE-ACETAMINOPHEN 5-325 MG PO TABS
1.0000 | ORAL_TABLET | ORAL | Status: DC | PRN
  Administered 2015-12-22 – 2015-12-23 (×2): 2 via ORAL
  Filled 2015-12-21 (×3): qty 2

## 2015-12-21 MED ORDER — IOHEXOL 350 MG/ML SOLN
INTRAVENOUS | Status: DC | PRN
  Administered 2015-12-21: 105 mL via INTRA_ARTERIAL

## 2015-12-21 MED ORDER — MIDAZOLAM HCL 2 MG/2ML IJ SOLN
INTRAMUSCULAR | Status: DC | PRN
  Administered 2015-12-21 (×3): 1 mg via INTRAVENOUS

## 2015-12-21 MED ORDER — LIDOCAINE HCL (PF) 1 % IJ SOLN
INTRAMUSCULAR | Status: DC | PRN
  Administered 2015-12-21: 5 mL via INTRADERMAL
  Administered 2015-12-21: 2 mL via INTRADERMAL

## 2015-12-21 MED ORDER — FENTANYL CITRATE (PF) 100 MCG/2ML IJ SOLN
INTRAMUSCULAR | Status: AC
  Filled 2015-12-21: qty 2

## 2015-12-21 MED ORDER — SODIUM CHLORIDE 0.9 % WEIGHT BASED INFUSION: 1.0000 mL/kg/h | INTRAVENOUS | Status: AC

## 2015-12-21 MED ORDER — ACETAMINOPHEN 325 MG PO TABS
650.0000 mg | ORAL_TABLET | ORAL | Status: DC | PRN
  Administered 2015-12-23 (×3): 650 mg via ORAL
  Filled 2015-12-21 (×3): qty 2

## 2015-12-21 MED ORDER — HEPARIN SODIUM (PORCINE) 1000 UNIT/ML IJ SOLN
INTRAMUSCULAR | Status: AC
  Filled 2015-12-21: qty 1

## 2015-12-21 MED ORDER — ISOSORBIDE MONONITRATE ER 30 MG PO TB24
30.0000 mg | ORAL_TABLET | Freq: Every day | ORAL | Status: DC
  Administered 2015-12-21 – 2015-12-22 (×2): 30 mg via ORAL
  Filled 2015-12-21 (×2): qty 1

## 2015-12-21 MED ORDER — VERAPAMIL HCL 2.5 MG/ML IV SOLN
INTRAVENOUS | Status: DC | PRN
  Administered 2015-12-21: 10 mL via INTRA_ARTERIAL

## 2015-12-21 MED ORDER — SODIUM CHLORIDE 0.9% FLUSH
3.0000 mL | Freq: Two times a day (BID) | INTRAVENOUS | Status: DC
  Administered 2015-12-21 – 2015-12-24 (×4): 3 mL via INTRAVENOUS

## 2015-12-21 MED ORDER — HEPARIN (PORCINE) IN NACL 2-0.9 UNIT/ML-% IJ SOLN
INTRAMUSCULAR | Status: DC | PRN
  Administered 2015-12-21: 1000 mL

## 2015-12-21 MED ORDER — FUROSEMIDE 40 MG PO TABS
40.0000 mg | ORAL_TABLET | Freq: Two times a day (BID) | ORAL | Status: DC
  Administered 2015-12-21 – 2015-12-23 (×5): 40 mg via ORAL
  Filled 2015-12-21 (×6): qty 1

## 2015-12-21 MED ORDER — PRASUGREL HCL 10 MG PO TABS
10.0000 mg | ORAL_TABLET | Freq: Every day | ORAL | Status: DC
  Administered 2015-12-22 – 2015-12-24 (×3): 10 mg via ORAL
  Filled 2015-12-21 (×3): qty 1

## 2015-12-21 SURGICAL SUPPLY — 21 items
CATH BALLN WEDGE 5F 110CM (CATHETERS) ×2 IMPLANT
CATH INFINITI 5 FR JL3.5 (CATHETERS) ×2 IMPLANT
CATH INFINITI JR4 5F (CATHETERS) ×2 IMPLANT
CATH OPTICROSS 40MHZ (CATHETERS) ×2 IMPLANT
CATH VISTA GUIDE 6FR XBLAD3.0 (CATHETERS) ×2 IMPLANT
DEVICE RAD COMP TR BAND LRG (VASCULAR PRODUCTS) ×2 IMPLANT
GLIDESHEATH SLEND A-KIT 6F 22G (SHEATH) ×2 IMPLANT
HOVERMATT SINGLE USE (MISCELLANEOUS) ×4 IMPLANT
KIT ESSENTIALS PG (KITS) ×2 IMPLANT
KIT HEART LEFT (KITS) ×2 IMPLANT
KIT HEART RIGHT NAMIC (KITS) ×2 IMPLANT
PACK CARDIAC CATHETERIZATION (CUSTOM PROCEDURE TRAY) ×2 IMPLANT
SHEATH FAST CATH BRACH 5F 5CM (SHEATH) ×2 IMPLANT
SLED PULL BACK IVUS (MISCELLANEOUS) ×2 IMPLANT
TRANSDUCER W/STOPCOCK (MISCELLANEOUS) ×4 IMPLANT
TUBING ART PRESS 72  MALE/FEM (TUBING) ×1
TUBING ART PRESS 72 MALE/FEM (TUBING) ×1 IMPLANT
TUBING CIL FLEX 10 FLL-RA (TUBING) ×2 IMPLANT
WIRE ASAHI PROWATER 180CM (WIRE) ×2 IMPLANT
WIRE MICROINTRODUCER 60CM (WIRE) ×2 IMPLANT
WIRE SAFE-T 1.5MM-J .035X260CM (WIRE) ×2 IMPLANT

## 2015-12-21 NOTE — Interval H&P Note (Signed)
Cath Lab Visit (complete for each Cath Lab visit)  Clinical Evaluation Leading to the Procedure:   ACS: Yes.    Non-ACS:    Anginal Classification: CCS III  Anti-ischemic medical therapy: Maximal Therapy (2 or more classes of medications)  Non-Invasive Test Results: Intermediate-risk stress test findings: cardiac mortality 1-3%/year  Prior CABG: No CABG      History and Physical Interval Note:  12/21/2015 3:09 PM  Edward [name]  has presented today for surgery, with the diagnosis of unstable angina  The various methods of treatment have been discussed with the patient and family. After consideration of risks, benefits and other options for treatment, the patient has consented to  Procedure(s): Right/Left Heart Cath and Coronary Angiography (N/A) as a surgical intervention .  The patient's history has been reviewed, patient examined, no change in status, stable for surgery.  I have reviewed the patient's chart and labs.  Questions were answered to the patient's satisfaction.     Sinclair Grooms

## 2015-12-21 NOTE — Progress Notes (Signed)
Inpatient Diabetes Program Recommendations  AACE/ADA: New Consensus Statement on Inpatient Glycemic Control (2015)  Target Ranges:  Prepandial:   less than 140 mg/dL      Peak postprandial:   less than 180 mg/dL (1-2 hours)      Critically ill patients:  140 - 180 mg/dL   Review of Glycemic Control  Diabetes history: DM 2 Outpatient Diabetes medications: 70/30 60 units QPM ONLY, Novolin R 40 units TID with meals Current orders for Inpatient glycemic control: 70/30 30 units BID, Novolog Moderate TID + HS  Inpatient Diabetes Program Recommendations:   Patient takes 70/30 once a day in the evenings at home. Please consider adjusting dose frequency from BID to once in the evenings with supper. When patient starts to eat again may want to increase correction to Novolog Resistant TID.  Thanks,  Tama Headings RN, MSN, [facility] Inpatient Diabetes Coordinator Team Pager [phone] (8a-5p)

## 2015-12-21 NOTE — Progress Notes (Signed)
UR Completed Jerral Mccauley Graves-Bigelow, RN,BSN [phone]  

## 2015-12-21 NOTE — H&P (View-Only) (Signed)
Patient Name: Edward [name] Date of Encounter: 12/21/2015  Primary Cardiologist: Dr. Tamala Julian   Principal Problem:   Unstable angina [facility]) Active Problems:   Dyslipidemia, goal LDL below 70   HTN (hypertension)   Type 2 diabetes mellitus with other circulatory complications (HCC)   GERD (gastroesophageal reflux disease)   Chest pain with high risk for cardiac etiology    SUBJECTIVE  Mild chest pain last night, also had some hematuria yesterday, however cleared up this morning.   CURRENT MEDS . amLODipine  10 mg Oral Daily  . antiseptic oral rinse  7 mL Mouth Rinse BID  . aspirin EC  81 mg Oral Daily  . budesonide-formoterol  2 puff Inhalation BID  . dicyclomine  10 mg Oral TID AC  . feeding supplement (ENSURE ENLIVE)  237 mL Oral BID BM  . fenofibrate  160 mg Oral Daily  . insulin aspart  0-15 Units Subcutaneous TID WC  . insulin aspart  0-5 Units Subcutaneous QHS  . insulin aspart protamine- aspart  30 Units Subcutaneous BID WC  . metoprolol  50 mg Oral BID  . pantoprazole  40 mg Oral Daily  . potassium chloride SA  20 mEq Oral Daily  . prasugrel  10 mg Oral Daily  . pravastatin  40 mg Oral Daily  . propranolol  40 mg Oral BID  . sodium chloride flush  3 mL Intravenous Q12H    OBJECTIVE  Filed Vitals:   12/20/15 2100 12/21/15 0043 12/21/15 0446 12/21/15 0744  BP: 130/55 132/63 118/44 143/49  Pulse: 62 60 71 62  Temp: 97.9 F (36.6 C) 97.6 F (36.4 C) 98.5 F (36.9 C) 98.7 F (37.1 C)  TempSrc:   Oral Oral  Resp: 17 15 18 16   Height:      Weight:   284 lb 6.4 oz (129.003 kg)   SpO2: 98% 96% 96% 96%    Intake/Output Summary (Last 24 hours) at 12/21/15 0744 Last data filed at 12/20/15 2203  Gross per 24 hour  Intake    600 ml  Output   1550 ml  Net   -950 ml   Filed Weights   12/19/15 0400 12/20/15 0400 12/21/15 0446  Weight: 284 lb 4.8 oz (128.958 kg) 284 lb 9.8 oz (129.1 kg) 284 lb 6.4 oz (129.003 kg)    PHYSICAL EXAM  General: Pleasant,  NAD. Neuro: Alert and oriented X 3. Moves all extremities spontaneously. Psych: Normal affect. HEENT:  Normal  Neck: Supple without bruits or JVD. Lungs:  Resp regular and unlabored, CTA. Heart: RRR no s3, s4, or murmurs. Abdomen: Soft, non-tender, non-distended, BS + x 4.  Extremities: No clubbing, cyanosis or edema. DP/PT/Radials 2+ and equal bilaterally.  Accessory Clinical Findings  CBC  Recent Labs  12/20/15 0315 12/21/15 0522  WBC 5.8 5.1  HGB 11.5* 11.4*  HCT 36.0* 34.5*  MCV 87.0 87.1  PLT 208 0000000   Basic Metabolic Panel  Recent Labs  12/20/15 1008 12/21/15 0522  NA 137 136  K 4.0 4.2  CL 99* 101  CO2 27 28  GLUCOSE 237* 294*  BUN 18 18  CREATININE 1.31* 1.36*  CALCIUM 9.3 8.6*   Liver Function Tests  Recent Labs  12/19/15 0545  AST 15  ALT 18  ALKPHOS 73  BILITOT 0.5  PROT 5.8*  ALBUMIN 3.1*    Recent Labs  12/18/15 1830 12/18/15 2356 12/19/15 0545  TROPONINI <0.03 <0.03 <0.03    Recent Labs  12/18/15 1830  CHOL  172  HDL 34*  LDLCALC 115*  TRIG 117  CHOLHDL 5.1   TELE NSR with PACs   ECG  No new EKG  Echocardiogram 12/17/2014  LV EF: 60% -  65%  ------------------------------------------------------------------- Indications:   Chest pain 786.51.  ------------------------------------------------------------------- History:  PMH:  Coronary artery disease. PMH:  Myocardial infarction. Risk factors: Hypertension. Diabetes mellitus. Obese. Dyslipidemia.  ------------------------------------------------------------------- Study Conclusions  - Left ventricle: The cavity size was normal. There was moderate concentric hypertrophy. Systolic function was normal. The estimated ejection fraction was in the range of 60% to 65%. Wall motion was normal; there were no regional wall motion abnormalities. There was an increased relative contribution of atrial contraction to ventricular filling. Doppler parameters  are consistent with abnormal left ventricular relaxation (grade 1 diastolic dysfunction). - Mitral valve: There was trivial regurgitation.    Radiology/Studies  Dg Chest 2 View  12/18/2015  CLINICAL DATA:  Chest pain and shortness of breath EXAM: CHEST  2 VIEW COMPARISON:  Chest radiograph October 31, 2015 ; chest CT August 04, 2015 FINDINGS: There is no edema or consolidation. Heart is upper normal in size with pulmonary vascularity within normal limits. No adenopathy. There is mid thoracic dextroscoliosis. There is degenerative change in the thoracic spine. IMPRESSION: No edema or consolidation.  Heart upper normal in size. Electronically Signed   By: Lowella Grip III M.D.   On: 12/18/2015 07:43    ASSESSMENT AND PLAN  1. Unstable angina and SOB  - Echo 12/18/2015 EF 60-65%, no RWMA, grade 1 DD  - Cath 01/30/2015 85% mid LAD lesion treated with DES, 30-50% distal RCA lesion, EF 55%  - 06/2015 with apical scar, no current ischemia. Low risk test.   - SOB persisted despite changing Brilinta to effient. PFT in 07/2015, per Dr. Lake Bells 11/30/2015, SOB related to obesity and deconditioning  - plan for L&RHC today, RHC given his ongoing dyspnea and inability to measure pulm pressures by echo.   - Risk and benefit of procedure explained to the patient who display clear understanding and agree to proceed. Discussed with patient possible procedural risk include bleeding, vascular injury, renal injury, arrythmia, MI, stroke and loss of limb or life.   2. CAD  3. HTN  4. HLD: Lipid panel 12/18/2015 Chol 172, trig 117, HDL 34, LDL 115  5. DM II  6. Hematuria: UA showed many RBC and hgb. Hgb largely stable despite on ASA and effient. Likely related to IV heparin, hematuria occurred yesterday, cleared up this morning.   Hilbert Corrigan PA-C Pager: [phone]   The patient was seen, examined and discussed with Almyra Deforest, PA-C and I agree with the above.    A very pleasant 75 year old male  with known CAD, s/p PCI/DES to mid LAD in 01/2015 who presented with unstable angina, two more episodes of chest pain this am, now asymptomatic on iv NTG and iv Heparin. L& R cath with Dr Tamala Julian today.  Right heart cath for ongoing dyspnea. LVEF normal on echo from 12/18/15. Continue ASA, Effient, pravastatin, metoprolol.  Dorothy Spark, MD 12/21/2015

## 2015-12-21 NOTE — Progress Notes (Signed)
Patient Name: Edward [name] Date of Encounter: 12/21/2015  Primary Cardiologist: Dr. Tamala Julian   Principal Problem:   Unstable angina [facility]) Active Problems:   Dyslipidemia, goal LDL below 70   HTN (hypertension)   Type 2 diabetes mellitus with other circulatory complications (HCC)   GERD (gastroesophageal reflux disease)   Chest pain with high risk for cardiac etiology    SUBJECTIVE  Mild chest pain last night, also had some hematuria yesterday, however cleared up this morning.   CURRENT MEDS . amLODipine  10 mg Oral Daily  . antiseptic oral rinse  7 mL Mouth Rinse BID  . aspirin EC  81 mg Oral Daily  . budesonide-formoterol  2 puff Inhalation BID  . dicyclomine  10 mg Oral TID AC  . feeding supplement (ENSURE ENLIVE)  237 mL Oral BID BM  . fenofibrate  160 mg Oral Daily  . insulin aspart  0-15 Units Subcutaneous TID WC  . insulin aspart  0-5 Units Subcutaneous QHS  . insulin aspart protamine- aspart  30 Units Subcutaneous BID WC  . metoprolol  50 mg Oral BID  . pantoprazole  40 mg Oral Daily  . potassium chloride SA  20 mEq Oral Daily  . prasugrel  10 mg Oral Daily  . pravastatin  40 mg Oral Daily  . propranolol  40 mg Oral BID  . sodium chloride flush  3 mL Intravenous Q12H    OBJECTIVE  Filed Vitals:   12/20/15 2100 12/21/15 0043 12/21/15 0446 12/21/15 0744  BP: 130/55 132/63 118/44 143/49  Pulse: 62 60 71 62  Temp: 97.9 F (36.6 C) 97.6 F (36.4 C) 98.5 F (36.9 C) 98.7 F (37.1 C)  TempSrc:   Oral Oral  Resp: 17 15 18 16   Height:      Weight:   284 lb 6.4 oz (129.003 kg)   SpO2: 98% 96% 96% 96%    Intake/Output Summary (Last 24 hours) at 12/21/15 0744 Last data filed at 12/20/15 2203  Gross per 24 hour  Intake    600 ml  Output   1550 ml  Net   -950 ml   Filed Weights   12/19/15 0400 12/20/15 0400 12/21/15 0446  Weight: 284 lb 4.8 oz (128.958 kg) 284 lb 9.8 oz (129.1 kg) 284 lb 6.4 oz (129.003 kg)    PHYSICAL EXAM  General: Pleasant,  NAD. Neuro: Alert and oriented X 3. Moves all extremities spontaneously. Psych: Normal affect. HEENT:  Normal  Neck: Supple without bruits or JVD. Lungs:  Resp regular and unlabored, CTA. Heart: RRR no s3, s4, or murmurs. Abdomen: Soft, non-tender, non-distended, BS + x 4.  Extremities: No clubbing, cyanosis or edema. DP/PT/Radials 2+ and equal bilaterally.  Accessory Clinical Findings  CBC  Recent Labs  12/20/15 0315 12/21/15 0522  WBC 5.8 5.1  HGB 11.5* 11.4*  HCT 36.0* 34.5*  MCV 87.0 87.1  PLT 208 0000000   Basic Metabolic Panel  Recent Labs  12/20/15 1008 12/21/15 0522  NA 137 136  K 4.0 4.2  CL 99* 101  CO2 27 28  GLUCOSE 237* 294*  BUN 18 18  CREATININE 1.31* 1.36*  CALCIUM 9.3 8.6*   Liver Function Tests  Recent Labs  12/19/15 0545  AST 15  ALT 18  ALKPHOS 73  BILITOT 0.5  PROT 5.8*  ALBUMIN 3.1*    Recent Labs  12/18/15 1830 12/18/15 2356 12/19/15 0545  TROPONINI <0.03 <0.03 <0.03    Recent Labs  12/18/15 1830  CHOL  172  HDL 34*  LDLCALC 115*  TRIG 117  CHOLHDL 5.1   TELE NSR with PACs   ECG  No new EKG  Echocardiogram 12/17/2014  LV EF: 60% -  65%  ------------------------------------------------------------------- Indications:   Chest pain 786.51.  ------------------------------------------------------------------- History:  PMH:  Coronary artery disease. PMH:  Myocardial infarction. Risk factors: Hypertension. Diabetes mellitus. Obese. Dyslipidemia.  ------------------------------------------------------------------- Study Conclusions  - Left ventricle: The cavity size was normal. There was moderate concentric hypertrophy. Systolic function was normal. The estimated ejection fraction was in the range of 60% to 65%. Wall motion was normal; there were no regional wall motion abnormalities. There was an increased relative contribution of atrial contraction to ventricular filling. Doppler parameters  are consistent with abnormal left ventricular relaxation (grade 1 diastolic dysfunction). - Mitral valve: There was trivial regurgitation.    Radiology/Studies  Dg Chest 2 View  12/18/2015  CLINICAL DATA:  Chest pain and shortness of breath EXAM: CHEST  2 VIEW COMPARISON:  Chest radiograph October 31, 2015 ; chest CT August 04, 2015 FINDINGS: There is no edema or consolidation. Heart is upper normal in size with pulmonary vascularity within normal limits. No adenopathy. There is mid thoracic dextroscoliosis. There is degenerative change in the thoracic spine. IMPRESSION: No edema or consolidation.  Heart upper normal in size. Electronically Signed   By: Lowella Grip III M.D.   On: 12/18/2015 07:43    ASSESSMENT AND PLAN  1. Unstable angina and SOB  - Echo 12/18/2015 EF 60-65%, no RWMA, grade 1 DD  - Cath 01/30/2015 85% mid LAD lesion treated with DES, 30-50% distal RCA lesion, EF 55%  - 06/2015 with apical scar, no current ischemia. Low risk test.   - SOB persisted despite changing Brilinta to effient. PFT in 07/2015, per Dr. Lake Bells 11/30/2015, SOB related to obesity and deconditioning  - plan for L&RHC today, RHC given his ongoing dyspnea and inability to measure pulm pressures by echo.   - Risk and benefit of procedure explained to the patient who display clear understanding and agree to proceed. Discussed with patient possible procedural risk include bleeding, vascular injury, renal injury, arrythmia, MI, stroke and loss of limb or life.   2. CAD  3. HTN  4. HLD: Lipid panel 12/18/2015 Chol 172, trig 117, HDL 34, LDL 115  5. DM II  6. Hematuria: UA showed many RBC and hgb. Hgb largely stable despite on ASA and effient. Likely related to IV heparin, hematuria occurred yesterday, cleared up this morning.   Hilbert Corrigan PA-C Pager: [phone]   The patient was seen, examined and discussed with Almyra Deforest, PA-C and I agree with the above.    A very pleasant 75 year old male  with known CAD, s/p PCI/DES to mid LAD in 01/2015 who presented with unstable angina, two more episodes of chest pain this am, now asymptomatic on iv NTG and iv Heparin. L& R cath with Dr Tamala Julian today.  Right heart cath for ongoing dyspnea. LVEF normal on echo from 12/18/15. Continue ASA, Effient, pravastatin, metoprolol.  Dorothy Spark, MD 12/21/2015

## 2015-12-21 NOTE — Care Management Note (Signed)
Case Management Note  Patient Details  Name: INDALECIO VAJDA MRN: [id number] Date of Birth: November 11, 1940  Subjective/Objective:  Pt admitted for chest pain. Plan for cardiac cath 12-21-15.                  Action/Plan: CM will continue to monitor for disposition needs.    Expected Discharge Date:                  Expected Discharge Plan:  Home/Self Care  In-House Referral:  NA  Discharge planning Services  CM Consult  Post Acute Care Choice:    Choice offered to:     DME Arranged:    DME Agency:     HH Arranged:    HH Agency:     Status of Service:  In process, will continue to follow  Medicare Important Message Given:    Date Medicare IM Given:    Medicare IM give by:    Date Additional Medicare IM Given:    Additional Medicare Important Message give by:     If discussed at [place] of Stay Meetings, dates discussed:    Additional Comments:  Bethena Roys, RN 12/21/2015, 10:46 AM

## 2015-12-21 NOTE — Care Management Important Message (Signed)
Important Message  Patient Details  Name: Edward [name] MRN: [id number] Date of Birth: 17-Nov-1940   Medicare Important Message Given:  Yes    [name] 12/21/2015, 11:19 AM

## 2015-12-22 ENCOUNTER — Encounter (HOSPITAL_COMMUNITY): Payer: Self-pay | Admitting: Interventional Cardiology

## 2015-12-22 DIAGNOSIS — I25700 Atherosclerosis of coronary artery bypass graft(s), unspecified, with unstable angina pectoris: Secondary | ICD-10-CM

## 2015-12-22 DIAGNOSIS — I5033 Acute on chronic diastolic (congestive) heart failure: Secondary | ICD-10-CM

## 2015-12-22 DIAGNOSIS — G4733 Obstructive sleep apnea (adult) (pediatric): Secondary | ICD-10-CM

## 2015-12-22 DIAGNOSIS — I25110 Atherosclerotic heart disease of native coronary artery with unstable angina pectoris: Secondary | ICD-10-CM

## 2015-12-22 LAB — BASIC METABOLIC PANEL
Anion gap: 9 (ref 5–15)
BUN: 16 mg/dL (ref 6–20)
CALCIUM: 8.7 mg/dL — AB (ref 8.9–10.3)
CO2: 27 mmol/L (ref 22–32)
CREATININE: 1.54 mg/dL — AB (ref 0.61–1.24)
Chloride: 100 mmol/L — ABNORMAL LOW (ref 101–111)
GFR calc Af Amer: 50 mL/min — ABNORMAL LOW (ref >60)
GFR calc non Af Amer: 43 mL/min — ABNORMAL LOW (ref >60)
GLUCOSE: 335 mg/dL — AB (ref 65–99)
Potassium: 4 mmol/L (ref 3.5–5.1)
Sodium: 136 mmol/L (ref 135–145)

## 2015-12-22 LAB — GLUCOSE, CAPILLARY
GLUCOSE-CAPILLARY: 252 mg/dL — AB (ref 65–99)
GLUCOSE-CAPILLARY: 343 mg/dL — AB (ref 65–99)
Glucose-Capillary: 307 mg/dL — ABNORMAL HIGH (ref 65–99)
Glucose-Capillary: 274 mg/dL — ABNORMAL HIGH (ref 65–99)

## 2015-12-22 MED ORDER — ISOSORBIDE MONONITRATE ER 60 MG PO TB24
60.0000 mg | ORAL_TABLET | Freq: Every day | ORAL | Status: DC
  Administered 2015-12-23 – 2015-12-24 (×2): 60 mg via ORAL
  Filled 2015-12-22 (×2): qty 1

## 2015-12-22 MED ORDER — ISOSORBIDE MONONITRATE ER 30 MG PO TB24
30.0000 mg | ORAL_TABLET | Freq: Once | ORAL | Status: AC
  Administered 2015-12-22: 30 mg via ORAL
  Filled 2015-12-22: qty 1

## 2015-12-22 NOTE — Consult Note (Signed)
Reason for Consult:CAD Referring Physician: Dr. Daneen Schick  Edward [name] is an 75 y.o. male.  HPI: 75 yo man admitted with chest pain  Edward [name] is a 75 yo man with an extensive PMH including hypertension, hyperlipidemia, poorly controlled type II diabetes, pneumonia, arthritis, TIA, and sleep apnea. His cardiac history includes an MI in 2013, CHF, and DES stent to LAD in April 2016.   He was admitted with atypical chest pain and ruled out for MI. He had an echo which revealed a normal EF with diastolic dysfunction. Cardiac cath yesterday showed hid DES was widely patent. There was plaque in the left main that extended to the origin of the circumflex. His left main was not significantly narrowed but the ostial circumflex was.  He is currently pain free.  Past Medical History  Diagnosis Date  . Hypertension   . CAD (coronary artery disease)     a. Pt reports "small heart attack" in 2013 at [facility], denies need for stent or CABG, does not know findings of cath.  Marland Kitchen TIA (transient ischemic attack) 2002  . Dyslipidemia, goal LDL below 70 01/29/2014  . GERD (gastroesophageal reflux disease)   . Enlarged prostate   . Shortness of breath     exertion  . DM type 2 (diabetes mellitus, type 2), insulin dependent 01/29/2014    fasting cbg 50-120 with new regimen  . Anxiety   . Heart attack ([place]) 02/03/2014    "mild heart attack"  . Pneumonia 01/2013  . Pneumonia 03/2014    hospitalized  . Sleep apnea     does not use cpap (06/04/2015)  . WRUEAVWU(981.1)     "monthly" (06/04/2015)  . Arthritis     'all over"  . Tremor of both hands   . Adenomatous colon polyp 04/2011  . Diverticulosis   . Internal hemorrhoid   . CHF (congestive heart failure) [facility])     Past Surgical History  Procedure Laterality Date  . Total knee arthroplasty Bilateral   . Lumbar disc surgery    . Cataract extraction Bilateral   . Joint replacement    . Back surgery    . Cataract extraction, bilateral  Bilateral   . Shoulder arthroscopy Right 07/30/2014    Procedure: Right Shoulder Arthroscopy, Debridement, Decompression, Manipulation Under Anesthesia;  Surgeon: Newt Minion, MD;  Location: [place];  Service: Orthopedics;  Laterality: Right;  . Left heart catheterization with coronary angiogram N/A 01/28/2014    Procedure: LEFT HEART CATHETERIZATION WITH CORONARY ANGIOGRAM;  Surgeon: Sinclair Grooms, MD;  Location: [facility] CATH LAB;  Service: Cardiovascular;  Laterality: N/A;  . Left heart catheterization with coronary angiogram N/A 01/30/2015    Procedure: LEFT HEART CATHETERIZATION WITH CORONARY ANGIOGRAM;  Surgeon: Belva Crome, MD;  Location: [facility] CATH LAB;  Service: Cardiovascular;  Laterality: N/A;  . Cardiac catheterization  01/28/14    + CAD treat medically  . Coronary angioplasty with stent placement  01/30/2015    DES Promus  Premier to LAD by Dr Tamala Julian  . Cardiac catheterization N/A 12/21/2015    Procedure: Right/Left Heart Cath and Coronary Angiography;  Surgeon: Belva Crome, MD;  Location: [place] CV LAB;  Service: Cardiovascular;  Laterality: N/A;    Family History  Problem Relation Age of Onset  . CAD Brother     2 brothers - CABG  . Alzheimer's disease Sister   . CAD Sister   . Prostate cancer Brother   . Diabetes Other  entire family  . Asthma Brother     2 brothers     Social History:  reports that he has never smoked. He has never used smokeless tobacco. He reports that he does not drink alcohol or use illicit drugs.  Allergies: No Known Allergies  Medications:  Prior to Admission:  Prescriptions prior to admission  Medication Sig Dispense Refill Last Dose  . amLODipine (NORVASC) 10 MG tablet Take 10 mg by mouth daily.  1 12/17/2015 at Unknown time  . aspirin EC 81 MG tablet Take 81 mg by mouth daily.   12/18/2015 at Unknown time  . Blood Glucose Monitoring Suppl (ONE TOUCH ULTRA SYSTEM KIT) W/DEVICE KIT Use to test blood sugar daily as instructed. Dx code:  E11.59 1 each 0 Taking  . Ciclopirox 1 % shampoo Apply 1 application topically at bedtime. At night after showering  0 12/17/2015 at Unknown time  . clonazePAM (KLONOPIN) 1 MG tablet Take 1 tablet (1 mg total) by mouth at bedtime as needed for anxiety. 30 tablet 2 12/17/2015 at Unknown time  . dextromethorphan-guaiFENesin (MUCINEX DM) 30-600 MG 12hr tablet Take 1 tablet by mouth 2 (two) times daily. 30 tablet 0 Past Month at Unknown time  . diclofenac sodium (VOLTAREN) 1 % GEL Apply 2 g topically 4 (four) times daily. 100 g 0 Past Week at Unknown time  . dicyclomine (BENTYL) 10 MG capsule Take 1 capsule (10 mg total) by mouth 3 (three) times daily before meals. 90 capsule 5 12/17/2015 at Unknown time  . esomeprazole (NEXIUM) 40 MG capsule Take 40 mg by mouth daily at 12 noon.   12/17/2015 at Unknown time  . fenofibrate 160 MG tablet Take 160 mg by mouth daily.  0 12/17/2015 at Unknown time  . Fluticasone Furoate-Vilanterol (BREO ELLIPTA) 100-25 MCG/INH AEPB Inhale 1 puff into the lungs daily. 60 each 5 12/17/2015 at Unknown time  . furosemide (LASIX) 40 MG tablet Take 1 tablet (40 mg total) by mouth daily. 90 tablet 2 12/17/2015 at Unknown time  . glucose blood test strip Use to test blood sugar 4 times daily instructed. Dx: E11.59 125 each 5 Taking  . insulin NPH-regular Human (NOVOLIN 70/30) (70-30) 100 UNIT/ML injection Inject 40-60 Units into the skin 2 (two) times daily with a meal. 40 units in the morning, 60 units in the evening   12/17/2015 at Unknown time  . insulin regular (NOVOLIN R,HUMULIN R) 100 units/mL injection Inject 40 Units into the skin 3 (three) times daily before meals.   12/17/2015 at Unknown time  . Insulin Syringes, Disposable, U-100 1 ML MISC Use 4x a day 200 each 11 Taking  . isosorbide mononitrate (IMDUR) 30 MG 24 hr tablet Take 1 tablet (30 mg total) by mouth daily. 30 tablet 0 12/17/2015 at Unknown time  . metoprolol (LOPRESSOR) 50 MG tablet Take 1 tablet (50 mg total) by mouth 2  (two) times daily. 60 tablet 6 12/17/2015 at 180  . nitroGLYCERIN (NITROSTAT) 0.4 MG SL tablet Place 1 tablet (0.4 mg total) under the tongue every 5 (five) minutes as needed for chest pain (CP or SOB). 25 tablet 4 12/18/2015 at Unknown time  . Polyvinyl Alcohol-Povidone (REFRESH OP) Place 1 drop into both eyes daily as needed (for dry eyes).   12/17/2015 at Unknown time  . potassium chloride SA (K-DUR,KLOR-CON) 20 MEQ tablet Take 1 tablet (20 mEq total) by mouth daily. 30 tablet 11 12/17/2015 at Unknown time  . prasugrel (EFFIENT) 10 MG TABS tablet Take 1  tablet (10 mg total) by mouth daily. 30 tablet 6 12/18/2015 at Unknown time  . pravastatin (PRAVACHOL) 40 MG tablet Take 1 tablet (40 mg total) by mouth daily. 30 tablet 5 12/17/2015 at Unknown time  . propranolol (INDERAL) 40 MG tablet Take 1 tablet (40 mg total) by mouth 2 (two) times daily. 60 tablet 11 12/17/2015 at 1800  . VENTOLIN HFA 108 (90 BASE) MCG/ACT inhaler Inhale 2 puffs into the lungs every 6 (six) hours as needed. (SHORTNESS OR BREATH / WHEEZING)  0 12/17/2015 at Unknown time    Results for orders placed or performed during the hospital encounter of 12/18/15 (from the past 48 hour(s))  Basic metabolic panel     Status: Abnormal   Collection Time: 12/20/15 10:08 AM  Result Value Ref Range   Sodium 137 135 - 145 mmol/L   Potassium 4.0 3.5 - 5.1 mmol/L   Chloride 99 (L) 101 - 111 mmol/L   CO2 27 22 - 32 mmol/L   Glucose, Bld 237 (H) 65 - 99 mg/dL   BUN 18 6 - 20 mg/dL   Creatinine, Ser 1.31 (H) 0.61 - 1.24 mg/dL   Calcium 9.3 8.9 - 10.3 mg/dL   GFR calc non Af Amer 52 (L) >60 mL/min   GFR calc Af Amer >60 >60 mL/min    Comment: (NOTE) The eGFR has been calculated using the CKD EPI equation. This calculation has not been validated in all clinical situations. eGFR's persistently <60 mL/min signify possible Chronic Kidney Disease.    Anion gap 11 5 - 15  Glucose, capillary     Status: Abnormal   Collection Time: 12/20/15 11:55 AM   Result Value Ref Range   Glucose-Capillary 199 (H) 65 - 99 mg/dL  Glucose, capillary     Status: Abnormal   Collection Time: 12/20/15  4:50 PM  Result Value Ref Range   Glucose-Capillary 309 (H) 65 - 99 mg/dL  Glucose, capillary     Status: Abnormal   Collection Time: 12/20/15  9:44 PM  Result Value Ref Range   Glucose-Capillary 231 (H) 65 - 99 mg/dL  Basic metabolic panel     Status: Abnormal   Collection Time: 12/21/15  5:22 AM  Result Value Ref Range   Sodium 136 135 - 145 mmol/L   Potassium 4.2 3.5 - 5.1 mmol/L   Chloride 101 101 - 111 mmol/L   CO2 28 22 - 32 mmol/L   Glucose, Bld 294 (H) 65 - 99 mg/dL   BUN 18 6 - 20 mg/dL   Creatinine, Ser 1.36 (H) 0.61 - 1.24 mg/dL   Calcium 8.6 (L) 8.9 - 10.3 mg/dL   GFR calc non Af Amer 50 (L) >60 mL/min   GFR calc Af Amer 58 (L) >60 mL/min    Comment: (NOTE) The eGFR has been calculated using the CKD EPI equation. This calculation has not been validated in all clinical situations. eGFR's persistently <60 mL/min signify possible Chronic Kidney Disease.    Anion gap 7 5 - 15  Protime-INR     Status: None   Collection Time: 12/21/15  5:22 AM  Result Value Ref Range   Prothrombin Time 14.4 11.6 - 15.2 seconds   INR 1.10 0.00 - 1.49  CBC     Status: Abnormal   Collection Time: 12/21/15  5:22 AM  Result Value Ref Range   WBC 5.1 4.0 - 10.5 K/uL   RBC 3.96 (L) 4.22 - 5.81 MIL/uL   Hemoglobin 11.4 (L) 13.0 - 17.0  g/dL   HCT 34.5 (L) 39.0 - 52.0 %   MCV 87.1 78.0 - 100.0 fL   MCH 28.8 26.0 - 34.0 pg   MCHC 33.0 30.0 - 36.0 g/dL   RDW 13.0 11.5 - 15.5 %   Platelets 204 150 - 400 K/uL  Glucose, capillary     Status: Abnormal   Collection Time: 12/21/15  7:41 AM  Result Value Ref Range   Glucose-Capillary 267 (H) 65 - 99 mg/dL  Glucose, capillary     Status: Abnormal   Collection Time: 12/21/15 11:23 AM  Result Value Ref Range   Glucose-Capillary 215 (H) 65 - 99 mg/dL  I-STAT 3, venous blood gas (G3P V)     Status: Abnormal    Collection Time: 12/21/15  3:36 PM  Result Value Ref Range   pH, Ven 7.377 (H) 7.250 - 7.300   pCO2, Ven 48.5 45.0 - 50.0 mmHg   pO2, Ven 33.0 30.0 - 45.0 mmHg   Bicarbonate 28.5 (H) 20.0 - 24.0 mEq/L   TCO2 30 0 - 100 mmol/L   O2 Saturation 61.0 %   Acid-Base Excess 3.0 (H) 0.0 - 2.0 mmol/L   Patient temperature HIDE    Sample type VENOUS    Comment NOTIFIED PHYSICIAN   I-STAT 3, arterial blood gas (G3+)     Status: Abnormal   Collection Time: 12/21/15  3:45 PM  Result Value Ref Range   pH, Arterial 7.397 7.350 - 7.450   pCO2 arterial 48.8 (H) 35.0 - 45.0 mmHg   pO2, Arterial 69.0 (L) 80.0 - 100.0 mmHg   Bicarbonate 30.0 (H) 20.0 - 24.0 mEq/L   TCO2 31 0 - 100 mmol/L   O2 Saturation 93.0 %   Acid-Base Excess 4.0 (H) 0.0 - 2.0 mmol/L   Patient temperature HIDE    Sample type ARTERIAL   POCT Activated clotting time     Status: None   Collection Time: 12/21/15  4:09 PM  Result Value Ref Range   Activated Clotting Time 302 seconds  Glucose, capillary     Status: Abnormal   Collection Time: 12/21/15  4:55 PM  Result Value Ref Range   Glucose-Capillary 160 (H) 65 - 99 mg/dL  POCT Activated clotting time     Status: None   Collection Time: 12/21/15  5:51 PM  Result Value Ref Range   Activated Clotting Time 183 seconds  Glucose, capillary     Status: Abnormal   Collection Time: 12/21/15  6:39 PM  Result Value Ref Range   Glucose-Capillary 237 (H) 65 - 99 mg/dL  CBC     Status: Abnormal   Collection Time: 12/21/15  6:41 PM  Result Value Ref Range   WBC 6.1 4.0 - 10.5 K/uL   RBC 4.20 (L) 4.22 - 5.81 MIL/uL   Hemoglobin 11.9 (L) 13.0 - 17.0 g/dL   HCT 36.5 (L) 39.0 - 52.0 %   MCV 86.9 78.0 - 100.0 fL   MCH 28.3 26.0 - 34.0 pg   MCHC 32.6 30.0 - 36.0 g/dL   RDW 13.2 11.5 - 15.5 %   Platelets 190 150 - 400 K/uL  Creatinine, serum     Status: Abnormal   Collection Time: 12/21/15  6:41 PM  Result Value Ref Range   Creatinine, Ser 1.25 (H) 0.61 - 1.24 mg/dL   GFR calc non Af  Amer 55 (L) >60 mL/min   GFR calc Af Amer >60 >60 mL/min    Comment: (NOTE) The eGFR has been calculated using  the CKD EPI equation. This calculation has not been validated in all clinical situations. eGFR's persistently <60 mL/min signify possible Chronic Kidney Disease.   Glucose, capillary     Status: Abnormal   Collection Time: 12/21/15  9:58 PM  Result Value Ref Range   Glucose-Capillary 350 (H) 65 - 99 mg/dL   Comment 1 Notify RN   Basic metabolic panel     Status: Abnormal   Collection Time: 12/22/15  4:50 AM  Result Value Ref Range   Sodium 136 135 - 145 mmol/L   Potassium 4.0 3.5 - 5.1 mmol/L   Chloride 100 (L) 101 - 111 mmol/L   CO2 27 22 - 32 mmol/L   Glucose, Bld 335 (H) 65 - 99 mg/dL   BUN 16 6 - 20 mg/dL   Creatinine, Ser 1.54 (H) 0.61 - 1.24 mg/dL   Calcium 8.7 (L) 8.9 - 10.3 mg/dL   GFR calc non Af Amer 43 (L) >60 mL/min   GFR calc Af Amer 50 (L) >60 mL/min    Comment: (NOTE) The eGFR has been calculated using the CKD EPI equation. This calculation has not been validated in all clinical situations. eGFR's persistently <60 mL/min signify possible Chronic Kidney Disease.    Anion gap 9 5 - 15  Glucose, capillary     Status: Abnormal   Collection Time: 12/22/15  8:09 AM  Result Value Ref Range   Glucose-Capillary 307 (H) 65 - 99 mg/dL   Comment 1 Notify RN    Comment 2 Document in Chart     No results found.  Review of Systems  Constitutional: Positive for malaise/fatigue and diaphoresis. Negative for fever and chills.  Respiratory: Positive for shortness of breath.   Cardiovascular: Positive for chest pain and leg swelling.   Blood pressure 157/75, pulse 64, temperature 98.4 F (36.9 C), temperature source Oral, resp. rate 19, height '5\' 7"'  (1.702 m), weight 277 lb (125.646 kg), SpO2 98 %. Physical Exam  Vitals reviewed. Constitutional: He is oriented to person, place, and time.  obese  HENT:  Head: Normocephalic and atraumatic.  Eyes: Conjunctivae  and EOM are normal. Pupils are equal, round, and reactive to light. No scleral icterus.  Neck: Neck supple. No thyromegaly present.  Cardiovascular: Normal rate, regular rhythm and intact distal pulses.  Exam reveals gallop (?S4).   No murmur heard. Respiratory: Effort normal and breath sounds normal. No respiratory distress. He has no wheezes. He has no rales.  GI: Soft. He exhibits no distension. There is no tenderness.  Musculoskeletal: He exhibits edema (1+).  Lymphadenopathy:    He has no cervical adenopathy.  Neurological: He is alert and oriented to person, place, and time. No cranial nerve deficit.  Motor intact  Skin: Skin is warm and dry.   CARDIAC CATHETERIZATION Conclusion    1. Dist RCA lesion, 40% stenosed. 2. Prox RCA to Mid RCA lesion, 25% stenosed. 3. Ost Cx lesion, 85% stenosed. 4. Mid LAD lesion, 40% stenosed. 5. Mid LAD to Dist LAD lesion, 5% stenosed. The lesion was previously treated with a stent (unknown type). 6. Ost 1st Diag lesion, 75% stenosed. 7. LM lesion, 50% stenosed. 8. The left ventricular systolic function is normal.   40-50% distal left main with cross-sectional area by intravascular ultrasound at most severe narrowing 5.96 mm (borderline).  85% ostial circumflex, driving the patient's presentation with class III/IV angina pectoris.  Widely patent mid LAD stent placed 10 months ago .  Moderate, noncritical disease in the native right  coronary.  Acute on chronic diastolic heart failure with marked elevation in filling pressure (LVEDP 30 mmHg and mean PCWP 24 mmHg).  Moderate pulmonary hypertension.  Recommendations:   Heart team approach with TCTS consult for consideration of coronary grafting.  If felt to be a high surgical risk, could consider stenting from the circumflex back into the left main, although this will require meticulous technique and could put the LAD at risk.  Most prudent approach at this time is probably intensified  medical therapy and PCI or CABG only if meds fail.  More aggressive diuresis and add LA nitrate.    ECHOCARDIOGRAM Study Conclusions  - Left ventricle: The cavity size was normal. There was moderate concentric hypertrophy. Systolic function was normal. The estimated ejection fraction was in the range of 60% to 65%. Wall motion was normal; there were no regional wall motion abnormalities. There was an increased relative contribution of atrial contraction to ventricular filling. Doppler parameters are consistent with abnormal left ventricular relaxation (grade 1 diastolic dysfunction). - Mitral valve: There was trivial regurgitation.  I personally reviewed the echo and cardiac catheterization films and agree with the official findings as noted above  Assessment/Plan: 75 yo man with single vessel CAD with unstable angina. Options include medical therapy, complex PCI, and CABG. CABG is certainly feasible and likely would result in pain relief but there is no survival benefit as survival is good regardless.  Discussed with Dr. Tamala Julian. I would favor an initial attempt to control symptoms with medical therapy. If he fails optimal medical therapy then either CABG or PCi would be an acceptable option for control of angina.    Melrose Nakayama 12/22/2015, 8:58 AM

## 2015-12-22 NOTE — Progress Notes (Signed)
CARDIAC REHAB PHASE I   PRE:  Rate/Rhythm: 62 SR  BP:  Sitting: 154/574        SaO2: 99 RA  MODE:  Ambulation: 480 ft   POST:  Rate/Rhythm: 73 SR  BP:  Sitting: 160/69         SaO2: 97 RA  Pt ambulated 480 ft on RA, rolling walker, assist x1, steady gait, tolerated well. Pt c/o DOE, wheezing (sats 97% on RA during ambulation), denies CP, dizziness, brief standing rest x1. Pt order received as pre-op order, pt was supposed to begin pulmonary rehab this Thursday, but has since been discharged due to his current hospitalization. Pt is very eager to participate in cardiac rehab or pulmonary rehab as an outpatient, but current diagnosis of unstable angina contraindicated for cardiac rehab at this time. If MD determines pt to have diagnosis of stable angina, pt would then qualify to participate in cardiac rehab. Encouraged pt to discuss this with his MD in the outpatient setting as we are unable to place order for CRP2 at this time. Pt to bed per pt request after walk, call bell within reach. Will follow.   [place], RN, BSN 12/22/2015 3:24 PM

## 2015-12-22 NOTE — Progress Notes (Signed)
Inpatient Diabetes Program Recommendations  AACE/ADA: New Consensus Statement on Inpatient Glycemic Control (2015)  Target Ranges:  Prepandial:   less than 140 mg/dL      Peak postprandial:   less than 180 mg/dL (1-2 hours)      Critically ill patients:  140 - 180 mg/dL  Results for CAMARON, ABDALA (MRN [id number]) as of 12/22/2015 13:23  Ref. Range 12/21/2015 07:41 12/21/2015 11:23 12/21/2015 16:55 12/21/2015 18:39 12/21/2015 21:58 12/22/2015 08:09 12/22/2015 11:43  Glucose-Capillary Latest Ref Range: 65-99 mg/dL 267 (H) 215 (H) 160 (H) 237 (H) 350 (H) 307 (H) 274 (H)   Review of Glycemic Control  Diabetes history: DM2 Outpatient Diabetes medications: 70/30 40 units QAM, 70/30 60 units  Current orders for Inpatient glycemic control: 70/30 30 units BID, Novolog 0-15 units TID with meals, Novolog 0-5 units QHS  Inpatient Diabetes Program Recommendations: Insulin - Basal: Please consider increasing 70/30 to 35 units BID. Correction (SSI): Please consider increasing Novolog correction to Resistant scale.  Thanks, Barnie Alderman, RN, MSN, CDE Diabetes Coordinator Inpatient Diabetes Program [phone] (Team Pager from [place] to [place]) [phone] ([place] office) [phone] [facility] office) [phone] [facility] office)

## 2015-12-22 NOTE — Progress Notes (Signed)
Inpatient Diabetes Program Recommendations  AACE/ADA: New Consensus Statement on Inpatient Glycemic Control (2015)  Target Ranges:  Prepandial:   less than 140 mg/dL      Peak postprandial:   less than 180 mg/dL (1-2 hours)      Critically ill patients:  140 - 180 mg/dL  Results for Edward, [name] (MRN [id number]) as of 12/22/2015 10:47  Ref. Range 12/21/2015 07:41 12/21/2015 11:23 12/21/2015 16:55 12/21/2015 18:39 12/21/2015 21:58 12/22/2015 08:09  Glucose-Capillary Latest Ref Range: 65-99 mg/dL 267 (H) 215 (H) 160 (H) 237 (H) 350 (H) 307 (H)  Review of Glycemic Control   Current orders for Inpatient glycemic control: 70/30 30 units BID, Novolog 0-15 units TID with meals, Novolog 0-5 units QHS  Inpatient Diabetes Program Recommendations: Insulin - Basal: In reviewing the chart, noted patient did not receive any 70/30 yesterday at all. Fasting glucose was 307 mg/dl today and patient has already received 70/30 30 units this morning. Will follow trends on ordered dose of 70/30 and make further recommendations if needed.  Thanks, Barnie Alderman, RN, MSN, CDE Diabetes Coordinator Inpatient Diabetes Program [phone] (Team Pager from [place] to [place]) [phone] ([place] office) [phone] [facility] office) [phone] [facility] office)

## 2015-12-22 NOTE — Progress Notes (Signed)
Patient Name: Edward [name] Date of Encounter: 12/22/2015   Principal Problem:   Unstable angina (HCC) Active Problems:   Dyslipidemia, goal LDL below 70   HTN (hypertension)   Abnormal myocardial perfusion study   OSA (obstructive sleep apnea)   Type 2 diabetes mellitus with other circulatory complications (HCC)   GERD (gastroesophageal reflux disease)   Chest pain with high risk for cardiac etiology   SUBJECTIVE  No complaints. Chest pain much better 1/10. Had a short episode after the cath. Thinks the imdur is helping. Still with SOB "can hear himself breath"  CURRENT MEDS . amLODipine  10 mg Oral Daily  . antiseptic oral rinse  7 mL Mouth Rinse BID  . aspirin  81 mg Oral Daily  . budesonide-formoterol  2 puff Inhalation BID  . dicyclomine  10 mg Oral TID AC  . enoxaparin (LOVENOX) injection  40 mg Subcutaneous Q24H  . feeding supplement (ENSURE ENLIVE)  237 mL Oral BID BM  . fenofibrate  160 mg Oral Daily  . furosemide  40 mg Oral BID  . insulin aspart  0-15 Units Subcutaneous TID WC  . insulin aspart  0-5 Units Subcutaneous QHS  . insulin aspart protamine- aspart  30 Units Subcutaneous BID WC  . isosorbide mononitrate  30 mg Oral Daily  . metoprolol  50 mg Oral BID  . pantoprazole  40 mg Oral Daily  . potassium chloride SA  20 mEq Oral Daily  . prasugrel  10 mg Oral Daily  . pravastatin  40 mg Oral Daily  . propranolol  40 mg Oral BID  . sodium chloride flush  3 mL Intravenous Q12H  . sodium chloride flush  3 mL Intravenous Q12H    OBJECTIVE  Filed Vitals:   12/22/15 0000 12/22/15 0500 12/22/15 0610 12/22/15 0811  BP: 159/75  149/57 157/75  Pulse: 62  60 64  Temp: 98.6 F (37 C)  98.4 F (36.9 C) 98.4 F (36.9 C)  TempSrc: Oral  Oral Oral  Resp: 18  18 19   Height:      Weight:  277 lb (125.646 kg)    SpO2: 95%  97% 98%    Intake/Output Summary (Last 24 hours) at 12/22/15 0848 Last data filed at 12/22/15 0448  Gross per 24 hour  Intake    240 ml   Output   3627 ml  Net  -3387 ml   Filed Weights   12/20/15 0400 12/21/15 0446 12/22/15 0500  Weight: 284 lb 9.8 oz (129.1 kg) 284 lb 6.4 oz (129.003 kg) 277 lb (125.646 kg)    PHYSICAL EXAM  General: Pleasant, NAD. Obese chronically ill appearing Neuro: Alert and oriented X 3. Moves all extremities spontaneously. Psych: Normal affect. HEENT:  Normal  Neck: Supple without bruits or JVD. Lungs:  Resp regular and unlabored, CTA. Heart: RRR no s3, s4, or murmurs. Abdomen: Soft, non-tender, obese., BS + x 4.  Extremities: No clubbing, cyanosis trace LE edema. DP/PT/Radials 2+ and equal bilaterally.  Accessory Clinical Findings  CBC  Recent Labs  12/21/15 0522 12/21/15 1841  WBC 5.1 6.1  HGB 11.4* 11.9*  HCT 34.5* 36.5*  MCV 87.1 86.9  PLT 204 99991111   Basic Metabolic Panel  Recent Labs  12/21/15 0522 12/21/15 1841 12/22/15 0450  NA 136  --  136  K 4.2  --  4.0  CL 101  --  100*  CO2 28  --  27  GLUCOSE 294*  --  335*  BUN  18  --  16  CREATININE 1.36* 1.25* 1.54*  CALCIUM 8.6*  --  8.7*   TELE  Some Sinus brady overnight with few PACs  Radiology/Studies  Dg Chest 2 View  12/18/2015  CLINICAL DATA:  Chest pain and shortness of breath EXAM: CHEST  2 VIEW COMPARISON:  Chest radiograph October 31, 2015 ; chest CT August 04, 2015 FINDINGS: There is no edema or consolidation. Heart is upper normal in size with pulmonary vascularity within normal limits. No adenopathy. There is mid thoracic dextroscoliosis. There is degenerative change in the thoracic spine. IMPRESSION: No edema or consolidation.  Heart upper normal in size. Electronically Signed   By: Lowella Grip III M.D.   On: 12/18/2015 07:43     ASSESSMENT AND PLAN  Edward [name] is a 75 y.o. male with a history of CAD s/p PCI/DES to mid LAD in 01/2015, HTN, HLD, OSA and poorly controlled DMT2 who presented to [facility] on 12/18/15 with chest pain concerning for [place].   Unstable angina and SOB - Echo  12/18/2015 EF 60-65%, no RWMA, grade 1 DD - Cath 01/30/2015 85% mid LAD lesion treated with DES, 30-50% distal RCA lesion, EF 55% - 06/2015 with apical scar, no current ischemia. Low risk test.  - SOB persisted despite changing Brilinta to effient. PFT in 07/2015, per Dr. Lake Bells 11/30/2015, SOB related to obesity and deconditioning - s/p L&RHC today, RHC given his ongoing dyspnea and inability to measure pulm pressures by echo. This revealed 40-50% L main with cross sectional area by IVUS 5.96 mm at most severe (borderline), 85% ostial LCx (culprit lesion), widely patent mLAD stent, mod non critical disease in RCA, and acute on chronic D/CHF with markedly elevated filling pressures (LVEDP 30/PCWP 24) as well as mod pulm HTN. Plan was to consult CTCS for CABG vs stenting from the circumflex back into the left main, although this will require meticulous technique and could put the LAD at risk. For now we will continue medical therapy (imdur added) and more aggressive diuresis. CTCS saw this AM and felt that CABG would probably result in pain relief but no survival benefit. Dr. Roxan Hockey discussed with Dr Tamala Julian who felt it was most appropriate to attempt to control sx with medical therapy. If he fails optimal medical therapy then either CABG or PCi would be an acceptable option for control of angina.  Acute on chronic diastolic CHF: as above RHC with acute on chronic D/CHF with markedly elevated filling pressures (LVEDP 30/PCWP 24) as well as mod pulm HTN. Given 1 dose IV lasix 60mg  yesterday. Now on lasix 40mg  po BID. Net neg 5.5L and weight down 7 lbs. Creat bumped a little today. 1.25--> 1.54 with diuresis and after contrast exposure. Would continue to monitor.    HTN: BP with moderate control. Still slightly elevated on amlodipine 10mg  daily, Lopressor 50mg  BID, imdur 30mg  daily, and propranolol 40mg  BID. Cannot increase BB due to bradycardia and do  not want to add ACE/ARB right now due to AKI.  HLD: Lipid panel 12/18/2015 Chol 172, trig 117, HDL 34, LDL 115. Continue statin  DM II: poorly controlled.  Hematuria: now resolved.    Judy Pimple PA-C Pager [phone]  The patient was seen, examined and discussed with Nell Range, PA-C and I agree with the above.    A very pleasant 75 year old male with known CAD, s/p PCI/DES to mid LAD in 01/2015 who presented with unstable angina, and underwent L& R cath with  Dr Tamala Julian yesterday with findings of 40-50% distal left main with cross-sectional area by intravascular ultrasound at most severe narrowing 5.96 mm (borderline), 85% ostial circumflex, driving the patient's presentation with class III/IV angina pectoris, widely patent mid LAD stent placed 10 months ago, moderate, noncritical disease in the native right coronary. Acute on chronic diastolic heart failure with marked elevation in filling pressure (LVEDP 30 mmHg and mean PCWP 24 mmHg) with Moderate pulmonary hypertension.  We weaned him off NTG drip today, he was started on imdur, he has persistent 1/10 chest pain, tolerable, we can potentially add Ranexa. We will start mobilizing him. Increase imdur to 60 mg po daily. Give additional 30 today. He diuresed 3.3 L overnight, now worsening Crea 1.2 --> 1.5 [place] post cath, we will follow tomorrow. Continue ASA, Effient, pravastatin, metoprolol.  Dorothy Spark 12/22/2015

## 2015-12-22 NOTE — Progress Notes (Signed)
Pulmonary Rehab Discharge Note: Edward [name] will be discharged from pulmonary rehab related to his current hospitalization and potential for Cardiac intervention.

## 2015-12-23 DIAGNOSIS — R9439 Abnormal result of other cardiovascular function study: Secondary | ICD-10-CM

## 2015-12-23 DIAGNOSIS — I209 Angina pectoris, unspecified: Secondary | ICD-10-CM

## 2015-12-23 LAB — GLUCOSE, CAPILLARY
GLUCOSE-CAPILLARY: 252 mg/dL — AB (ref 65–99)
GLUCOSE-CAPILLARY: 282 mg/dL — AB (ref 65–99)
GLUCOSE-CAPILLARY: 215 mg/dL — AB (ref 65–99)
Glucose-Capillary: 157 mg/dL — ABNORMAL HIGH (ref 65–99)

## 2015-12-23 LAB — BASIC METABOLIC PANEL
ANION GAP: 11 (ref 5–15)
BUN: 18 mg/dL (ref 6–20)
CHLORIDE: 98 mmol/L — AB (ref 101–111)
CO2: 29 mmol/L (ref 22–32)
Calcium: 9 mg/dL (ref 8.9–10.3)
Creatinine, Ser: 1.53 mg/dL — ABNORMAL HIGH (ref 0.61–1.24)
GFR calc Af Amer: 50 mL/min — ABNORMAL LOW (ref >60)
GFR, EST NON AFRICAN AMERICAN: 43 mL/min — AB (ref >60)
GLUCOSE: 249 mg/dL — AB (ref 65–99)
POTASSIUM: 4.2 mmol/L (ref 3.5–5.1)
Sodium: 138 mmol/L (ref 135–145)

## 2015-12-23 MED ORDER — RANOLAZINE ER 500 MG PO TB12
1000.0000 mg | ORAL_TABLET | Freq: Two times a day (BID) | ORAL | Status: DC
  Administered 2015-12-23 – 2015-12-24 (×2): 1000 mg via ORAL
  Filled 2015-12-23 (×3): qty 2

## 2015-12-23 NOTE — Progress Notes (Addendum)
Patient Name: Edward [name] Date of Encounter: 12/23/2015   Principal Problem:   Unstable angina [facility]) Active Problems:   Dyslipidemia, goal LDL below 70   HTN (hypertension)   Abnormal myocardial perfusion study   OSA (obstructive sleep apnea)   Type 2 diabetes mellitus with other circulatory complications (HCC)   GERD (gastroesophageal reflux disease)   Chest pain with high risk for cardiac etiology   SUBJECTIVE   no complaints today. Chest pain is much better. Shortness of breath is improved. He is wondering when he can go home.  CURRENT MEDS . amLODipine  10 mg Oral Daily  . antiseptic oral rinse  7 mL Mouth Rinse BID  . aspirin  81 mg Oral Daily  . budesonide-formoterol  2 puff Inhalation BID  . dicyclomine  10 mg Oral TID AC  . enoxaparin (LOVENOX) injection  40 mg Subcutaneous Q24H  . feeding supplement (ENSURE ENLIVE)  237 mL Oral BID BM  . fenofibrate  160 mg Oral Daily  . furosemide  40 mg Oral BID  . insulin aspart  0-15 Units Subcutaneous TID WC  . insulin aspart  0-5 Units Subcutaneous QHS  . insulin aspart protamine- aspart  30 Units Subcutaneous BID WC  . isosorbide mononitrate  60 mg Oral Daily  . metoprolol  50 mg Oral BID  . pantoprazole  40 mg Oral Daily  . potassium chloride SA  20 mEq Oral Daily  . prasugrel  10 mg Oral Daily  . pravastatin  40 mg Oral Daily  . propranolol  40 mg Oral BID  . sodium chloride flush  3 mL Intravenous Q12H  . sodium chloride flush  3 mL Intravenous Q12H    OBJECTIVE  Filed Vitals:   12/22/15 1458 12/22/15 1609 12/22/15 1930 12/23/15 0325  BP:  141/80 110/38 112/43  Pulse: 59 65 61 59  Temp:  98.5 F (36.9 C) 98.4 F (36.9 C) 98.6 F (37 C)  TempSrc:  Oral Oral Oral  Resp: 20 17 16 17   Height:      Weight:    278 lb 14.4 oz (126.508 kg)  SpO2: 99% 99% 98% 97%    Intake/Output Summary (Last 24 hours) at 12/23/15 0720 Last data filed at 12/23/15 0714  Gross per 24 hour  Intake    720 ml  Output   1800  ml  Net  -1080 ml   Filed Weights   12/21/15 0446 12/22/15 0500 12/23/15 0325  Weight: 284 lb 6.4 oz (129.003 kg) 277 lb (125.646 kg) 278 lb 14.4 oz (126.508 kg)    PHYSICAL EXAM  General: Pleasant, NAD. Obese chronically ill appearing Neuro: Alert and oriented X 3. Moves all extremities spontaneously. Psych: Normal affect. HEENT:  Normal  Neck: Supple without bruits or JVD. Lungs:  Resp regular and unlabored, CTA. Heart: RRR no s3, s4, or murmurs. Abdomen: Soft, non-tender, obese., BS + x 4.  Extremities: No clubbing, cyanosis trace LE edema. DP/PT/Radials 2+ and equal bilaterally.  Accessory Clinical Findings  CBC  Recent Labs  12/21/15 0522 12/21/15 1841  WBC 5.1 6.1  HGB 11.4* 11.9*  HCT 34.5* 36.5*  MCV 87.1 86.9  PLT 204 99991111   Basic Metabolic Panel  Recent Labs  12/21/15 0522 12/21/15 1841 12/22/15 0450  NA 136  --  136  K 4.2  --  4.0  CL 101  --  100*  CO2 28  --  27  GLUCOSE 294*  --  335*  BUN 18  --  16  CREATININE 1.36* 1.25* 1.54*  CALCIUM 8.6*  --  8.7*   TELE  Some sinus brady overnight   Radiology/Studies  Dg Chest 2 View  12/18/2015  CLINICAL DATA:  Chest pain and shortness of breath EXAM: CHEST  2 VIEW COMPARISON:  Chest radiograph October 31, 2015 ; chest CT August 04, 2015 FINDINGS: There is no edema or consolidation. Heart is upper normal in size with pulmonary vascularity within normal limits. No adenopathy. There is mid thoracic dextroscoliosis. There is degenerative change in the thoracic spine. IMPRESSION: No edema or consolidation.  Heart upper normal in size. Electronically Signed   By: Lowella Grip III M.D.   On: 12/18/2015 07:43     ASSESSMENT AND PLAN  Edward [name] is a 75 y.o. male with a history of CAD s/p PCI/DES to mid LAD in 01/2015, HTN, HLD, OSA and poorly controlled DMT2 who presented to [facility] on 12/18/15 with chest pain concerning for [place].   Unstable angina and SOB - Echo 12/18/2015 EF 60-65%, no  RWMA, grade 1 DD - Cath 01/30/2015 85% mid LAD lesion treated with DES, 30-50% distal RCA lesion, EF 55% - 06/2015 with apical scar, no current ischemia. Low risk test.  - SOB persisted despite changing Brilinta to effient. PFT in 07/2015, per Dr. Lake Bells 11/30/2015, SOB related to obesity and deconditioning - s/p L&RHC today, RHC given his ongoing dyspnea and inability to measure pulm pressures by echo. This revealed 40-50% L main with cross sectional area by IVUS 5.96 mm at most severe (borderline), 85% ostial LCx (culprit lesion), widely patent mLAD stent, mod non critical disease in RCA, and acute on chronic D/CHF with markedly elevated filling pressures (LVEDP 30/PCWP 24) as well as mod pulm HTN. Plan was to consult CTCS for CABG vs stenting from the circumflex back into the left main, although this will require meticulous technique and could put the LAD at risk. For now we will continue medical therapy (imdur added) and more aggressive diuresis. CTCS saw yesterday and felt that CABG would probably result in pain relief but no survival benefit. Dr. Roxan Hockey discussed with Dr Tamala Julian who felt it was most appropriate to attempt to control sx with medical therapy. If he fails optimal medical therapy then either CABG or PCI would be an acceptable option for control of angina.  -Continue ASA, Effient, pravastatin, metoprolol. Could possibly add Ranexa if chest pain still not controlled. He seems to be doing okay today.   Acute on chronic diastolic CHF: as above RHC with acute on chronic D/CHF with markedly elevated filling pressures (LVEDP 30/PCWP 24) as well as mod pulm HTN. Given 1 dose IV lasix 60mg  yesterday. Now on lasix 40mg  po BID. Net neg 6.6L and weight down 6 lbs. Creat bumped a little yesterday. 1.25--> 1.54 with diuresis and after contrast exposure. Creat 1.53 today. Would continue to monitor.    HTN: BP better controlled today. Continue  on current regimen of amlodipine 10mg  daily, Lopressor 50mg  BID, imdur 30mg  daily, and propranolol 40mg  BID.   HLD: Lipid panel 12/18/2015 Chol 172, trig 117, HDL 34, LDL 115. Continue statin  DM II: poorly controlled. Follow with PCP.   Hematuria: now resolved.    Dispo: possibly home today or tomorrow with close outpatient follow up. He would like to be cleared for pulmonary rehabilitation as he was doing really well with this and wants to lose weight and improve his health.  Judy Pimple PA-C Pager [phone]  The patient  was seen, examined and discussed with Nell Range, PA-C and I agree with the above.   A very pleasant 75 year old male with known CAD, s/p PCI/DES to mid LAD in 01/2015 who presented with unstable angina, and underwent L& R cath with Dr Tamala Julian yesterday with findings of 40-50% distal left main with cross-sectional area by intravascular ultrasound at most severe narrowing 5.96 mm (borderline), 85% ostial circumflex, driving the patient's presentation with class III/IV angina pectoris, widely patent mid LAD stent placed 10 months ago, moderate, noncritical disease in the native right coronary. Acute on chronic diastolic heart failure with marked elevation in filling pressure (LVEDP 30 mmHg and mean PCWP 24 mmHg) with Moderate pulmonary hypertension. He had another episode of chest pain this morning, we will continue imdur 60 mg po daily, add ranolazine to his regimen. Start PT&OT.  If he still stable tomorrow we will discharge with an early follow up and consider high risk PCI if he continues to have symptoms.  Dorothy Spark 12/23/2015

## 2015-12-23 NOTE — Evaluation (Signed)
Physical Therapy Evaluation Patient Details Name: Edward [name] MRN: [id number] DOB: February 04, 1941 Today's Date: 12/23/2015   History of Present Illness  Pt is a 75 y/o M admitted w/ chest pain and SOB. Pt underwent Lt and Rt heart cath and has been diagnosed w/ unstable angina.  Pt's PMH includes LAD stent placed ~10 months ago, acute on chronic diastolic heart failure, anxiety, heart attack 4/15, tremor of both hands, Bil TKA, lumbar disc surgery.  Clinical Impression  Pt admitted with above diagnosis. Pt currently with functional limitations due to the deficits listed below (see PT Problem List). Mr. Lownes presents w/ instability w/ high level balance activities, specifically avoiding obstacles while ambulating.  He expresses interest in gait training w/ a RW as he has had two falls over the past 6 months and lives alone. Pt will benefit from skilled PT to increase their independence and safety with mobility to allow discharge to the venue listed below.      Follow Up Recommendations Home health PT;Other (comment);Supervision - Intermittent (cardiopulmonary rehab)    Equipment Recommendations  Rolling walker with 5" wheels    Recommendations for Other Services       Precautions / Restrictions Precautions Precautions: Fall Restrictions Weight Bearing Restrictions: No      Mobility  Bed Mobility               General bed mobility comments: Pt standing at sink in room upon PT arrival  Transfers Overall transfer level: Modified independent Equipment used: None             General transfer comment: No physical assist or cues needed   Ambulation/Gait Ambulation/Gait assistance: Supervision Ambulation Distance (Feet): 200 Feet Assistive device: None Gait Pattern/deviations: Step-through pattern;Decreased stride length   Gait velocity interpretation: Below normal speed for age/gender General Gait Details: Instability w/ high level balance activities as described below.   Otherwise, supervision for safety.  Stairs            Wheelchair Mobility    Modified Rankin (Stroke Patients Only)       Balance Overall balance assessment: Needs assistance;History of Falls Sitting-balance support: No upper extremity supported;Feet supported Sitting balance-Leahy Scale: Normal     Standing balance support: No upper extremity supported;During functional activity Standing balance-Leahy Scale: Fair               High level balance activites: Backward walking;Turns;Sudden stops;Head turns;Other (comment) (avoiding obstacles ) High Level Balance Comments: Instability w/ avoiding obstacles in hallway but no outside assist needed.               Pertinent Vitals/Pain Pain Assessment: No/denies pain    Home Living Family/patient expects to be discharged to:: Private residence Living Arrangements: Alone Available Help at Discharge: Neighbor;Friend(s);Available PRN/intermittently Type of Home: House Home Access: Stairs to enter Entrance Stairs-Rails: None Entrance Stairs-Number of Steps: 2 Home Layout: One level Home Equipment: Other (comment) (life alert)      Prior Function Level of Independence: Independent         Comments: still driving, retired.  Reports 2 falls over the past 6 months due to tripping on objects     Hand Dominance        Extremity/Trunk Assessment               Lower Extremity Assessment: Overall WFL for tasks assessed         Communication   Communication: No difficulties  Cognition Arousal/Alertness: Awake/alert Behavior During Therapy: [facility]  for tasks assessed/performed Overall Cognitive Status: Within Functional Limits for tasks assessed                      General Comments General comments (skin integrity, edema, etc.): VSS    Exercises        Assessment/Plan    PT Assessment Patient needs continued PT services  PT Diagnosis Difficulty walking   PT Problem List Decreased  balance;Decreased safety awareness;Decreased knowledge of use of DME;Cardiopulmonary status limiting activity  PT Treatment Interventions DME instruction;Gait training;Stair training;Functional mobility training;Therapeutic activities;Therapeutic exercise;Balance training;Patient/family education   PT Goals (Current goals can be found in the Care Plan section) Acute Rehab PT Goals Patient Stated Goal: to go home PT Goal Formulation: With patient Time For Goal Achievement: 01/15/16 Potential to Achieve Goals: Good    Frequency Min 3X/week   Barriers to discharge Inaccessible home environment;Decreased caregiver support lives alone w/ 2 steps to enter home    Co-evaluation               End of Session Equipment Utilized During Treatment: Gait belt Activity Tolerance: Patient tolerated treatment well Patient left: in chair;with call bell/phone within reach Nurse Communication: Mobility status         Time: [id number] PT Time Calculation (min) (ACUTE ONLY): 11 min   Charges:   PT Evaluation $PT Eval Low Complexity: 1 Procedure     PT G Codes:       Joslyn Hy PT, DPT [phone] Pager: [phone] 12/23/2015, 3:58 PM

## 2015-12-23 NOTE — Progress Notes (Signed)
CARDIAC REHAB PHASE I   Pt in bed, states he had one episode of chest pain this morning, relieved with IV medicine. Pt declines ambulation at this time, states he plans to walk this afternoon with his nurse (schedule will not allow [place] to see him this afternoon). Reviewed exercise guidelines, nitroglycerin use. Pt verbalized understanding. Pt does not have qualifying diagnosis to send phase 2 cardiac rehab referral this admission, however, pt advised to discuss the possibility of outpatient cardiac rehab/pulmonary rehab referral with his cardiologist after discharge from hospital. Pt verbalized understanding. Pt in bed, call bell within reach.     [id number] Lenna Sciara, RN, BSN 12/23/2015 12:39 PM

## 2015-12-24 ENCOUNTER — Ambulatory Visit (HOSPITAL_COMMUNITY): Payer: Medicare Other

## 2015-12-24 ENCOUNTER — Telehealth: Payer: Self-pay | Admitting: *Deleted

## 2015-12-24 ENCOUNTER — Encounter (HOSPITAL_COMMUNITY): Payer: Self-pay | Admitting: Physician Assistant

## 2015-12-24 ENCOUNTER — Telehealth: Payer: Self-pay | Admitting: Interventional Cardiology

## 2015-12-24 DIAGNOSIS — I25110 Atherosclerotic heart disease of native coronary artery with unstable angina pectoris: Secondary | ICD-10-CM | POA: Diagnosis present

## 2015-12-24 LAB — GLUCOSE, CAPILLARY
GLUCOSE-CAPILLARY: 237 mg/dL — AB (ref 65–99)
Glucose-Capillary: 397 mg/dL — ABNORMAL HIGH (ref 65–99)
Glucose-Capillary: 339 mg/dL — ABNORMAL HIGH (ref 65–99)

## 2015-12-24 MED ORDER — ISOSORBIDE MONONITRATE ER 60 MG PO TB24: 60.0000 mg | ORAL_TABLET | Freq: Every day | ORAL | Status: DC

## 2015-12-24 NOTE — Discharge Instructions (Signed)

## 2015-12-24 NOTE — Discharge Summary (Signed)
Discharge Summary    Patient ID: Edward [name],  MRN: [id number], DOB/AGE: 1941-02-11 75 y.o.  Admit date: 12/18/2015 Discharge date: 12/24/2015  Primary Care Provider: Garnet Koyanagi Primary Cardiologist: Dr. Tamala Julian    Discharge Diagnoses    Principal Problem:   Unstable angina [facility]) Active Problems:   CAD S/P LAD DES 01/30/15   Dyslipidemia, goal LDL below 70   DM type 2 (diabetes mellitus, type 2), insulin dependent   HTN (hypertension)   Essential tremor   OSA (obstructive sleep apnea)   GERD (gastroesophageal reflux disease)   CAD (coronary artery disease)   Allergies No Known Allergies   History of Present Illness     Edward [name] is a 75 y.o. male with a history of CAD s/p PCI/DES to mid LAD in 01/2015, HTN, HLD, OSA and poorly controlled DMT2 who presented to [facility] on 12/18/15 with chest pain concerning for [place].   He has a history of CAD. Cath 01/30/2015 with 85% mid LAD lesion treated with DES, 30-50% distal RCA lesion, EF 55%. He had shortness of breath with Brilinta however this persisted despite switching him to Effient. Was seen by Dr. Lake Bells in 11/2015 and he felt shortness of breath was related to obesity and deconditioning. He had a stress test in 06/2015 with apical scar, no current ischemia: low risk test. He had been working with pulmonary rehabilitation and trying and increase activity and loose weight.   He was admitted to [facility] from 2/17-2/23/17 for dyspnea and chest pain worrisome for [place]. Upon presentation he had reported gaining 25 pounds over the past 6 months. it was decided to proceed with left and right heart cath: Right heart cath given his ongoing dyspnea and inability to measure pulmonary pressure by echo. He underwent L&RHC 12/21/15 which revealed 40-50% L main with cross sectional area by IVUS 5.96 mm at most severe (borderline), 85% ostial LCx (culprit lesion), widely patent mLAD stent, mod non critical disease in RCA, and acute on chronic  D/CHF with markedly elevated filling pressures (LVEDP 30/PCWP 24) as well as mod pulm HTN. Plan was to consult CTCS for CABG vs stenting from the circumflex back into the left main, although this will require meticulous technique and could put the LAD at risk. CTCS saw patient and felt that CABG would probably result in pain relief but no survival benefit. Dr. Roxan Hockey discussed with Dr Tamala Julian who felt it was most appropriate to attempt to control sx with medical therapy. If he fails optimal medical therapy then either CABG or PCI would be an acceptable option for control of angina. He was started on Imdur which was titrated up to 60 mg. He continued to have chest pain and was started on Ranexa 1048m BID. Additionally, he was started on IV Lasix due to elevated filling pressures on right heart cath and diuresed 6.6 L. His discharge weight was 277 pounds.  His creatinine did bump a little after heart cath and IV diuresis. It appears stable we'll follow this closely as an outpatient.    Hospital Course     Consultants: none  CAD: underwent L&RHC 12/21/15 which revealed 40-50% L main with cross sectional area by IVUS 5.96 mm at most severe (borderline), 85% ostial LCx (culprit lesion), widely patent mLAD stent, mod non critical disease in RCA, and acute on chronic D/CHF with markedly elevated filling pressures (LVEDP 30/PCWP 24) as well as mod pulm HTN. Ultimately, it was decided to continue medical therapy for chest pain.  Continue Imdur 60 mg and Ranexa 1000 mg twice a day. If he fails medical therapy, we will have to discuss high-risk PCI or CABG.  I have contacted the office to have samples of Ranexa made available to the patient. He can continue with pulmonary rehabilitation.  -- Continue aspirin, Effient, statin, beta blocker.   Acute on chronic diastolic CHF: as above RHC with acute on chronic D/CHF with markedly elevated filling pressures (LVEDP 30/PCWP 24) as well as mod pulm HTN. Diuresed with IV  lasix. Net neg 6.6L and weight down 6 lbs. Discharge weight 277 lbs. Creat bumped a little yesterday. 1.25--> 1.54 with diuresis and after contrast exposure, but appears stable. I will obtain BMET at follow up next week. We'll discharge him on Lasix 40 mg by mouth daily, which was his previous home dose.   HTN: BP well controlled. Continue on current regimen of amlodipine 62m daily, Lopressor 563mBID, imdur 6017maily, and propranolol 18m81mD.   HLD: Lipid panel 12/18/2015 Chol 172, trig 117, HDL 34, LDL 115. Continue statin  DM II: poorly controlled. Follow with PCP.   Essential tremor: continue propranolol  The patient has had an uncomplicated hospital course and is recovering well. The radial catheter site is stable. He has been seen by Dr. NelsMeda Coffeeay and deemed ready for discharge home. All follow-up appointments have been scheduled. Discharge medications are listed below. I have placed an order for home health services as he lives alone and needs assistance.   _____________  Discharge Vitals Blood pressure 129/58, pulse 56, temperature 98.6 F (37 C), temperature source Oral, resp. rate 18, height _0  (1.702 m), weight 277 lb 8 oz (125.873 kg), SpO2 96 %.  Filed Weights   12/22/15 0500 12/23/15 0325 12/24/15 0616  Weight: 277 lb (125.646 kg) 278 lb 14.4 oz (126.508 kg) 277 lb 8 oz (125.873 kg)    Labs & Radiologic Studies     CBC  Recent Labs  12/21/15 1841  WBC 6.1  HGB 11.9*  HCT 36.5*  MCV 86.9  PLT 190 350asic Metabolic Panel  Recent Labs  12/22/15 0450 12/23/15 0537  NA 136 138  K 4.0 4.2  CL 100* 98*  CO2 27 29  GLUCOSE 335* 249*  BUN 16 18  CREATININE 1.54* 1.53*  CALCIUM 8.7* 9.0    Dg Chest 2 View  12/18/2015  CLINICAL DATA:  Chest pain and shortness of breath EXAM: CHEST  2 VIEW COMPARISON:  Chest radiograph October 31, 2015 ; chest CT August 04, 2015 FINDINGS: There is no edema or consolidation. Heart is upper normal in size with pulmonary  vascularity within normal limits. No adenopathy. There is mid thoracic dextroscoliosis. There is degenerative change in the thoracic spine. IMPRESSION: No edema or consolidation.  Heart upper normal in size. Electronically Signed   By: WillLowella Grip M.D.   On: 12/18/2015 07:43     Diagnostic Studies/Procedures    12/21/15 Procedures    Right/Left Heart Cath and Coronary Angiography    Conclusion    1. Dist RCA lesion, 40% stenosed. 2. Prox RCA to Mid RCA lesion, 25% stenosed. 3. Ost Cx lesion, 85% stenosed. 4. Mid LAD lesion, 40% stenosed. 5. Mid LAD to Dist LAD lesion, 5% stenosed. The lesion was previously treated with a stent (unknown type). 6. Ost 1st Diag lesion, 75% stenosed. 7. LM lesion, 50% stenosed. 8. The left ventricular systolic function is normal.   40-50% distal left main with cross-sectional area  by intravascular ultrasound at most severe narrowing 5.96 mm (borderline).  85% ostial circumflex, driving the patient's presentation with class III/IV angina pectoris.  Widely patent mid LAD stent placed 10 months ago .  Moderate, noncritical disease in the native right coronary.  Acute on chronic diastolic heart failure with marked elevation in filling pressure (LVEDP 30 mmHg and mean PCWP 24 mmHg).  Moderate pulmonary hypertension.  Recommendations:   Heart team approach with TCTS consult for consideration of coronary grafting.  If felt to be a high surgical risk, could consider stenting from the circumflex back into the left main, although this will require meticulous technique and could put the LAD at risk.  Most prudent approach at this time is probably intensified medical therapy and PCI or CABG only if meds fail.  More aggressive diuresis and add LA nitrate.      2D ECHO: 12/18/2015 LV EF: 60% -  65% Study Conclusions - Left ventricle: The cavity size was normal. There was moderate concentric hypertrophy. Systolic function was  normal. The estimated ejection fraction was in the range of 60% to 65%. Wall motion was normal; there were no regional wall motion abnormalities. There was an increased relative contribution of atrial contraction to ventricular filling. Doppler parameters are consistent with abnormal left ventricular relaxation (grade 1 diastolic dysfunction). - Mitral valve: There was trivial regurgitation. _____________    Disposition   Pt is being discharged home today in good condition.  Follow-up Plans & Appointments    Follow-up Information    Follow up with Eileen Stanford, PA-C On 12/31/2015.   Specialties:  Cardiology, Radiology   Why:  @ 9am    Contact information:   [place] [address] [phone]       Follow up with [facility].   Specialty:  Home Health Services   Why:  Physical Therapy   Contact information:   [place]  [address] [phone]        Discharge Medications   Current Discharge Medication List    CONTINUE these medications which have CHANGED   Details  isosorbide mononitrate (IMDUR) 60 MG 24 hr tablet Take 1 tablet (60 mg total) by mouth daily. Qty: 30 tablet, Refills: 6      CONTINUE these medications which have NOT CHANGED   Details  amLODipine (NORVASC) 10 MG tablet Take 10 mg by mouth daily. Refills: 1    aspirin EC 81 MG tablet Take 81 mg by mouth daily.    Blood Glucose Monitoring Suppl (ONE TOUCH ULTRA SYSTEM KIT) W/DEVICE KIT Use to test blood sugar daily as instructed. Dx code: E11.59 Qty: 1 each, Refills: 0    Ciclopirox 1 % shampoo Apply 1 application topically at bedtime. At night after showering Refills: 0    clonazePAM (KLONOPIN) 1 MG tablet Take 1 tablet (1 mg total) by mouth at bedtime as needed for anxiety. Qty: 30 tablet, Refills: 2   Associated Diagnoses: Anxiety    dextromethorphan-guaiFENesin (MUCINEX DM) 30-600 MG 12hr  tablet Take 1 tablet by mouth 2 (two) times daily. Qty: 30 tablet, Refills: 0    diclofenac sodium (VOLTAREN) 1 % GEL Apply 2 g topically 4 (four) times daily. Qty: 100 g, Refills: 0    dicyclomine (BENTYL) 10 MG capsule Take 1 capsule (10 mg total) by mouth 3 (three) times daily before meals. Qty: 90 capsule, Refills: 5    esomeprazole (NEXIUM) 40 MG capsule  Take 40 mg by mouth daily at 12 noon.    fenofibrate 160 MG tablet Take 160 mg by mouth daily. Refills: 0    Fluticasone Furoate-Vilanterol (BREO ELLIPTA) 100-25 MCG/INH AEPB Inhale 1 puff into the lungs daily. Qty: 60 each, Refills: 5    furosemide (LASIX) 40 MG tablet Take 1 tablet (40 mg total) by mouth daily. Qty: 90 tablet, Refills: 2    glucose blood test strip Use to test blood sugar 4 times daily instructed. Dx: E11.59 Qty: 125 each, Refills: 5    insulin NPH-regular Human (NOVOLIN 70/30) (70-30) 100 UNIT/ML injection Inject 40-60 Units into the skin 2 (two) times daily with a meal. Patient stated that he only take 70/30 60 units QPM with supper Per home medication list: Prescribed 40 units in the morning, 60 units in the evening    insulin regular (NOVOLIN R,HUMULIN R) 100 units/mL injection Inject 40 Units into the skin 3 (three) times daily before meals.    Insulin Syringes, Disposable, U-100 1 ML MISC Use 4x a day Qty: 200 each, Refills: 11    metoprolol (LOPRESSOR) 50 MG tablet Take 1 tablet (50 mg total) by mouth 2 (two) times daily. Qty: 60 tablet, Refills: 6    nitroGLYCERIN (NITROSTAT) 0.4 MG SL tablet Place 1 tablet (0.4 mg total) under the tongue every 5 (five) minutes as needed for chest pain (CP or SOB). Qty: 25 tablet, Refills: 4    Polyvinyl Alcohol-Povidone (REFRESH OP) Place 1 drop into both eyes daily as needed (for dry eyes).    potassium chloride SA (K-DUR,KLOR-CON) 20 MEQ tablet Take 1 tablet (20 mEq total) by mouth daily. Qty: 30 tablet, Refills: 11    prasugrel (EFFIENT) 10 MG TABS tablet  Take 1 tablet (10 mg total) by mouth daily. Qty: 30 tablet, Refills: 6   Associated Diagnoses: CAD S/P percutaneous coronary angioplasty; Essential hypertension; Dyspnea; OSA (obstructive sleep apnea); Dyslipidemia, goal LDL below 70    pravastatin (PRAVACHOL) 40 MG tablet Take 1 tablet (40 mg total) by mouth daily. Qty: 30 tablet, Refills: 5    propranolol (INDERAL) 40 MG tablet Take 1 tablet (40 mg total) by mouth 2 (two) times daily. Qty: 60 tablet, Refills: 11    VENTOLIN HFA 108 (90 BASE) MCG/ACT inhaler Inhale 2 puffs into the lungs every 6 (six) hours as needed. (SHORTNESS OR BREATH / WHEEZING) Refills: 0         Aspirin prescribed at discharge?  Yes High Intensity Statin Prescribed? (Lipitor 40-52m or Crestor 20-446m: Yes Beta Blocker Prescribed? Yes For EF 45% or less, Was ACEI/ARB Prescribed? No: n/a ADP Receptor Inhibitor Prescribed? (i.e. Plavix etc.-Includes Medically Managed Patients): Yes For EF <40%, Aldosterone Inhibitor Prescribed? No: n/a Was EF assessed during THIS hospitalization? Yes Was Cardiac Rehab II ordered? (Included Medically managed Patients): No: he is in pulm rehab   Outstanding Labs/Studies   BMET  Duration of Discharge Encounter   Greater than 30 minutes including physician time.  SiAudrie GallusKATHRYN R PA-C 12/24/2015, 1:29 PM

## 2015-12-24 NOTE — Progress Notes (Signed)
Inpatient Diabetes Program Recommendations  AACE/ADA: New Consensus Statement on Inpatient Glycemic Control (2015)  Target Ranges:  Prepandial:   less than 140 mg/dL      Peak postprandial:   less than 180 mg/dL (1-2 hours)      Critically ill patients:  140 - 180 mg/dL  Results for JUPITER, REICHOW (MRN [id number]) as of 12/24/2015 11:54  Ref. Range 12/23/2015 07:59 12/23/2015 11:47 12/23/2015 16:46 12/23/2015 21:30 12/24/2015 07:42 12/24/2015 11:31  Glucose-Capillary Latest Ref Range: 65-99 mg/dL 252 (H) Novolog 8 units 70/30 30 units 282 (H) Novolog 8 units 215 (H) Novolog 5 units 70/30 30 units 157 (H)  237 (H) Novolog 5 units @10 :13 70/30 30 units @ 10:15 397 (H)   Review of Glycemic Control  Diabetes history: DM2 Outpatient Diabetes medications: 70/30 40 units QAM, 70/30 60 units  Current orders for Inpatient glycemic control: 70/30 30 units BID, Novolog 0-15 units TID with meals, Novolog 0-5 units QHS  Inpatient Diabetes Program Recommendations: Insulin - Basal: Please consider increasing 70/30 to 35 units BID. Correction (SSI): Please consider increasing Novolog correction to Resistant scale.  NOTE: In reviewing the chart, noted fasting glucose was 237 mg/dl at 7:42 am and 70/30 and Novolog was not given until 10:15 and 10:13 am (2 hours and 45 minutes after glucose was checked). Then glucose was checked again at 11:31 am which resulted in glucose of 397 mg/dl due to the delay in insulin administration this morning.   Thanks, Barnie Alderman, RN, MSN, CDE Diabetes Coordinator Inpatient Diabetes Program [phone] (Team Pager from [place] to [place]) [phone] ([place] office) [phone] [facility] office) [phone] [facility] office)

## 2015-12-24 NOTE — Plan of Care (Signed)
Problem: Skin Integrity: Goal: Risk for impaired skin integrity will decrease Outcome: Completed/Met Date Met:  12/24/15 Patient's skin remains clean, dry and intact and his right brachial site is clean, dry and intact with no s/s of complications

## 2015-12-24 NOTE — Telephone Encounter (Signed)
TCM per Kathlene November  3/2 @ 830 w/ Raliegh Ip [place]

## 2015-12-24 NOTE — Plan of Care (Signed)
Problem: Pain Managment: Goal: General experience of comfort will improve Outcome: Completed/Met Date Met:  12/24/15 Patient calls for pain meds as needed and is able to relay where the pain is, how bad it is, if it radiates and if he can get any relief with anything other than pain meds

## 2015-12-24 NOTE — Plan of Care (Signed)
Problem: Education: Goal: Knowledge of [facility] Education information/materials will improve Outcome: Completed/Met Date Met:  12/24/15 Patient has reviewed and verbalized understanding of his disease process, treatments and medications and seems to have an adequate amount of knowledge about when to call MD or dial 911 for assistance

## 2015-12-24 NOTE — Plan of Care (Signed)
Problem: Safety: Goal: Ability to remain free from injury will improve Outcome: Completed/Met Date Met:  12/24/15 Patient follows rules on the unit and calls for assistance as needed, he has his SR up for aid in repositioning and uses the white board to call his nurse or tech as instructed

## 2015-12-24 NOTE — Progress Notes (Signed)
Patient Name: Edward [name] Date of Encounter: 12/24/2015   Principal Problem:   Unstable angina [facility]) Active Problems:   Dyslipidemia, goal LDL below 70   HTN (hypertension)   Abnormal myocardial perfusion study   OSA (obstructive sleep apnea)   Type 2 diabetes mellitus with other circulatory complications (HCC)   GERD (gastroesophageal reflux disease)   Chest pain with high risk for cardiac etiology   SUBJECTIVE   no complaints today. Chest pain is much better. Shortness of breath is improved. Wants to go home  CURRENT MEDS . amLODipine  10 mg Oral Daily  . antiseptic oral rinse  7 mL Mouth Rinse BID  . aspirin  81 mg Oral Daily  . budesonide-formoterol  2 puff Inhalation BID  . dicyclomine  10 mg Oral TID AC  . enoxaparin (LOVENOX) injection  40 mg Subcutaneous Q24H  . feeding supplement (ENSURE ENLIVE)  237 mL Oral BID BM  . fenofibrate  160 mg Oral Daily  . furosemide  40 mg Oral BID  . insulin aspart  0-15 Units Subcutaneous TID WC  . insulin aspart  0-5 Units Subcutaneous QHS  . insulin aspart protamine- aspart  30 Units Subcutaneous BID WC  . isosorbide mononitrate  60 mg Oral Daily  . metoprolol  50 mg Oral BID  . pantoprazole  40 mg Oral Daily  . potassium chloride SA  20 mEq Oral Daily  . prasugrel  10 mg Oral Daily  . pravastatin  40 mg Oral Daily  . propranolol  40 mg Oral BID  . ranolazine  1,000 mg Oral BID  . sodium chloride flush  3 mL Intravenous Q12H  . sodium chloride flush  3 mL Intravenous Q12H    OBJECTIVE  Filed Vitals:   12/24/15 0616 12/24/15 0706 12/24/15 0744 12/24/15 1133  BP:   151/49 129/58  Pulse:  62 55 56  Temp:   98.1 F (36.7 C) 98.6 F (37 C)  TempSrc:   Oral Oral  Resp:  18 18 18   Height:      Weight: 277 lb 8 oz (125.873 kg)     SpO2:  97% 95% 96%    Intake/Output Summary (Last 24 hours) at 12/24/15 1219 Last data filed at 12/24/15 0900  Gross per 24 hour  Intake    840 ml  Output    875 ml  Net    -35 ml    Filed Weights   12/22/15 0500 12/23/15 0325 12/24/15 0616  Weight: 277 lb (125.646 kg) 278 lb 14.4 oz (126.508 kg) 277 lb 8 oz (125.873 kg)    PHYSICAL EXAM  General: Pleasant, NAD. Obese chronically ill appearing Neuro: Alert and oriented X 3. Moves all extremities spontaneously. Psych: Normal affect. HEENT:  Normal  Neck: Supple without bruits or JVD. Lungs:  Resp regular and unlabored, CTA. Heart: RRR no s3, s4, or murmurs. Abdomen: Soft, non-tender, obese., BS + x 4.  Extremities: No clubbing, cyanosis trace LE edema. DP/PT/Radials 2+ and equal bilaterally.  Accessory Clinical Findings  CBC  Recent Labs  12/21/15 1841  WBC 6.1  HGB 11.9*  HCT 36.5*  MCV 86.9  PLT 99991111   Basic Metabolic Panel  Recent Labs  12/22/15 0450 12/23/15 0537  NA 136 138  K 4.0 4.2  CL 100* 98*  CO2 27 29  GLUCOSE 335* 249*  BUN 16 18  CREATININE 1.54* 1.53*  CALCIUM 8.7* 9.0   TELE  Some sinus brady overnight   Radiology/Studies  Dg Chest 2 View  12/18/2015  CLINICAL DATA:  Chest pain and shortness of breath EXAM: CHEST  2 VIEW COMPARISON:  Chest radiograph October 31, 2015 ; chest CT August 04, 2015 FINDINGS: There is no edema or consolidation. Heart is upper normal in size with pulmonary vascularity within normal limits. No adenopathy. There is mid thoracic dextroscoliosis. There is degenerative change in the thoracic spine. IMPRESSION: No edema or consolidation.  Heart upper normal in size. Electronically Signed   By: Lowella Grip III M.D.   On: 12/18/2015 07:43     ASSESSMENT AND PLAN  Edward [name] is a 75 y.o. male with a history of CAD s/p PCI/DES to mid LAD in 01/2015, HTN, HLD, OSA and poorly controlled DMT2 who presented to [facility] on 12/18/15 with chest pain concerning for [place].   Unstable angina and SOB - Echo 12/18/2015 EF 60-65%, no RWMA, grade 1 DD - Cath 01/30/2015 85% mid LAD lesion treated with DES, 30-50% distal RCA lesion, EF  55% - 06/2015 with apical scar, no current ischemia. Low risk test.  - SOB persisted despite changing Brilinta to effient. PFT in 07/2015, per Dr. Lake Bells 11/30/2015, SOB related to obesity and deconditioning - s/p L&RHC today, RHC given his ongoing dyspnea and inability to measure pulm pressures by echo. This revealed 40-50% L main with cross sectional area by IVUS 5.96 mm at most severe (borderline), 85% ostial LCx (culprit lesion), widely patent mLAD stent, mod non critical disease in RCA, and acute on chronic D/CHF with markedly elevated filling pressures (LVEDP 30/PCWP 24) as well as mod pulm HTN. Plan was to consult CTCS for CABG vs stenting from the circumflex back into the left main, although this will require meticulous technique and could put the LAD at risk. For now we will continue medical therapy (imdur added) and more aggressive diuresis. CTCS saw yesterday and felt that CABG would probably result in pain relief but no survival benefit. Dr. Roxan Hockey discussed with Dr Tamala Julian who felt it was most appropriate to attempt to control sx with medical therapy. If he fails optimal medical therapy then either CABG or PCI would be an acceptable option for control of angina.  -Continue ASA, Effient, pravastatin, metoprolol. He was started on Ranexa 1000mg  BID yesterday and feeling much better. I have arranged close outpatient follow up with me next week. If he continues to have chest pain despite maximal medical management, then high risk PCI can be discussed with Dr. Tamala Julian.  Acute on chronic diastolic CHF: as above RHC with acute on chronic D/CHF with markedly elevated filling pressures (LVEDP 30/PCWP 24) as well as mod pulm HTN. Given 1 dose IV lasix 60mg  yesterday. Now on lasix 40mg  po BID. Net neg 6.6L and weight down 6 lbs. Creat bumped a little yesterday. 1.25--> 1.54 with diuresis and after contrast exposure. Creat stable. I will obtain BMET at follow up next  week.  HTN: BP better controlled today. Continue on current regimen of amlodipine 10mg  daily, Lopressor 50mg  BID, imdur 30mg  daily, and propranolol 40mg  BID.   HLD: Lipid panel 12/18/2015 Chol 172, trig 117, HDL 34, LDL 115. Continue statin  DM II: poorly controlled. Follow with PCP.   Hematuria: now resolved.    Dispo: he would like to resume pulmonary rehabilitation as he was doing really well with this and wants to lose weight and improve his health. We will discuss this as an outpatient.    Judy Pimple PA-C Pager [phone]   The  patient was seen, examined and discussed with Nell Range, PA-C and I agree with the above.   A very pleasant 75 year old male with known CAD, s/p PCI/DES to mid LAD in 01/2015 who presented with unstable angina, and underwent L& R cath with Dr Tamala Julian yesterday with findings of 40-50% distal left main with cross-sectional area by intravascular ultrasound at most severe narrowing 5.96 mm (borderline), 85% ostial circumflex, driving the patient's presentation with class III/IV angina pectoris, widely patent mid LAD stent placed 10 months ago, moderate, noncritical disease in the native right coronary. Acute on chronic diastolic heart failure with marked elevation in filling pressure (LVEDP 30 mmHg and mean PCWP 24 mmHg) with Moderate pulmonary hypertension. He had another episode of chest pain yesterday, we continued increased dose of imdur 60 mg po daily, added ranolazine to his regimen. We  will try to give him free samples from the clinic and if effective, prescribe.  We will refer him back to the pulmonary rehab, also call a case manager for home health as he lives alone.  Dorothy Spark, MD 12/24/2015

## 2015-12-24 NOTE — Progress Notes (Signed)
Physical Therapy Treatment Patient Details Name: Edward [name] MRN: [id number] DOB: [birth year] Today's Date: 12/24/2015    History of Present Illness Pt is a 75 y/o M admitted w/ chest pain and SOB. Pt underwent Lt and Rt heart cath and has been diagnosed w/ unstable angina.  Pt's PMH includes LAD stent placed ~10 months ago, acute on chronic diastolic heart failure, anxiety, heart attack 4/15, tremor of both hands, Bil TKA, lumbar disc surgery.    PT Comments    Patient progressing with tolerance to activity as well as with balance activities.  Feel cane indicated for safer community mobility as well as possible need for walker at times when fatigued, or upon first awakening due to fall risk.  No current follow up PT due to planned outpatient cardiac rehab, though did discuss with pt to seek outpatient neurorehab for balance therapy once finished with cardiac rehab.  Follow Up Recommendations  Other (comment);Supervision - Intermittent (outpatient cardiac rehab)     Equipment Recommendations  Rolling walker with 5" wheels    Recommendations for Other Services       Precautions / Restrictions Precautions Precautions: Fall Restrictions Weight Bearing Restrictions: No    Mobility  Bed Mobility               General bed mobility comments: up in chair  Transfers Overall transfer level: Modified independent               General transfer comment: initial posterior bias wtih legs braced against chair, no LOB  Ambulation/Gait   Ambulation Distance (Feet): 300 Feet Assistive device: None Gait Pattern/deviations: Step-through pattern;Decreased stride length     General Gait Details: dyanamic gait activities to include head turns, nods, backwards walking, quick turns and stop and side stepping    Stairs Stairs: Yes Stairs assistance: Supervision Stair Management: One rail Right;Alternating pattern;Forwards Number of Stairs: 4 General stair comments: reliant on  railing  Wheelchair Mobility    Modified Rankin (Stroke Patients Only)       Balance                             High level balance activites: Backward walking;Side stepping;Turns;Head turns High Level Balance Comments: no LOB with these activities; educated in fall prevention to include foot wear, lighting, use of railing on steps, use of cane in community and where to obtain and clear path to bathroom at home    Cognition Arousal/Alertness: Awake/alert Behavior During Therapy: [facility] for tasks assessed/performed Overall Cognitive Status: Within Functional Limits for tasks assessed                      Exercises      General Comments        Pertinent Vitals/Pain Pain Assessment: No/denies pain    Home Living                      Prior Function            PT Goals (current goals can now be found in the care plan section) Progress towards PT goals: Progressing toward goals    Frequency  Min 3X/week    PT Plan Current plan remains appropriate    Co-evaluation             End of Session Equipment Utilized During Treatment: Gait belt Activity Tolerance: Patient tolerated treatment well Patient left: in chair;with call  bell/phone within reach     Time: 0850-0914 PT Time Calculation (min) (ACUTE ONLY): 24 min  Charges:  $Gait Training: 23-37 mins                    G Codes:      Reginia Naas 2015-12-28, 2:36 PM  Magda Kiel, [place] 2015/12/28

## 2015-12-24 NOTE — Care Management Note (Signed)
Case Management Note  Patient Details  Name: Edward [name] MRN: [id number] Date of Birth: Jan 03, 1941  Subjective/Objective:    Pt admitted for chest pain- Cardiac Cath 12-21-15. Pt plan for d/c home today.                 Action/Plan: CM did discuss with pt in regards to disposition needs. Pt stated he is home alone and has support of brother in [place]. Pt is agreeable to [facility] PT services with [facility]- Choice Provided. Referral was made and SOC to begin within 24-48 hours of d/c. Choice given to pt for DME and RW to be provided via [facility]. DME ot be delivered to room before d/c. CM did provide pt with the [facility] list. Pt to call to get that set up. No further needs from CM at this time.    Expected Discharge Date:                  Expected Discharge Plan:  [place]  In-House Referral:  NA  Discharge planning Services  CM Consult  Post Acute Care Choice:  Durable Medical Equipment, Home Health Choice offered to:  Patient  DME Arranged:  Walker rolling DME Agency:  [place]:  PT [facility] Agency:  [facility]  Status of Service:  Completed, signed off  Medicare Important Message Given:  Yes Date Medicare IM Given:    Medicare IM give by:    Date Additional Medicare IM Given:    Additional Medicare Important Message give by:     If discussed at [place] of Stay Meetings, dates discussed:    Additional Comments:  Bethena Roys, RN 12/24/2015, 12:59 PM

## 2015-12-24 NOTE — Progress Notes (Signed)
Patient discharge paperwork gone over with patient. All questions answered to patients satisfaction. IV removed intact, telemetry discontinued. Patient discharged home with brother, by way of wheelchair.

## 2015-12-24 NOTE — Progress Notes (Signed)
Report received in patient's room using SBAR format via Conservation officer, nature. Updated on patient's VS, new orders, labs, tests and general condition, assumed care of patient.

## 2015-12-24 NOTE — Telephone Encounter (Signed)
Received staff message from Angelena Form, PA in regards to the patient needing ranexa samples. We do not have any at this time. I called patient and left a vm letting him know this and suggested that he call us back later or tomorrow to see if we get any in.

## 2015-12-25 ENCOUNTER — Ambulatory Visit: Payer: Medicare Other | Admitting: Interventional Cardiology

## 2015-12-25 ENCOUNTER — Telehealth: Payer: Self-pay | Admitting: *Deleted

## 2015-12-25 ENCOUNTER — Other Ambulatory Visit: Payer: Self-pay | Admitting: *Deleted

## 2015-12-25 DIAGNOSIS — I2510 Atherosclerotic heart disease of native coronary artery without angina pectoris: Secondary | ICD-10-CM

## 2015-12-25 DIAGNOSIS — Z9861 Coronary angioplasty status: Principal | ICD-10-CM

## 2015-12-25 DIAGNOSIS — I10 Essential (primary) hypertension: Secondary | ICD-10-CM

## 2015-12-25 DIAGNOSIS — E785 Hyperlipidemia, unspecified: Secondary | ICD-10-CM

## 2015-12-25 DIAGNOSIS — G4733 Obstructive sleep apnea (adult) (pediatric): Secondary | ICD-10-CM

## 2015-12-25 DIAGNOSIS — R0600 Dyspnea, unspecified: Secondary | ICD-10-CM

## 2015-12-25 MED ORDER — PRASUGREL HCL 10 MG PO TABS: 10.0000 mg | ORAL_TABLET | Freq: Every day | ORAL | Status: DC

## 2015-12-25 NOTE — Telephone Encounter (Addendum)
Transition Care Management Follow-up Telephone Call  Admit date: 12/18/2015 Discharge date: 12/24/2015  Primary Care Provider: Garnet Koyanagi Primary Cardiologist: Dr. Tamala Julian    Discharge Diagnoses   Principal Problem:  Unstable angina [facility]) Active Problems:  CAD S/P LAD DES 01/30/15  Dyslipidemia, goal LDL below 70  DM type 2 (diabetes mellitus, type 2), insulin dependent  HTN (hypertension)  Essential tremor  OSA (obstructive sleep apnea)  GERD (gastroesophageal reflux disease)  CAD (coronary artery disease)        How have you been since you were released from the hospital? "I got up this morning and ate some breakfast and got a little dizzy so I got back in bed. Today is my first day home. I'm breathing better, but I'm tired."   Do you understand why you were in the hospital? yes   Do you understand the discharge instructions? yes   Where were you discharged to? Home   Items Reviewed:  Medications reviewed: yes  Allergies reviewed: yes  Dietary changes reviewed: yes, heart healthy diet, low sodium  Referrals reviewed: yes, [place] RN, cardiology    Functional Questionnaire:   Activities of Daily Living (ADLs):   He states they are independent in the following: ambulation, bathing and hygiene, continence and toileting States they require assistance with the following: bathing and hygiene, grooming and dressing   Any transportation issues/concerns?: no, pt states his brother brings to appts   Any patient concerns? no   Confirmed importance and date/time of follow-up visits scheduled yes  Provider Appointment booked with Dr. Etter Sjogren 01/01/16 @ 11:30am  Confirmed with patient if condition begins to worsen call PCP or go to the ER.  Patient was given the office number and encouraged to call back with question or concerns.  : yes, pt states he has office number and also has an emergency "Life Alert" pendant he can use in case of emergency.

## 2015-12-25 NOTE — Telephone Encounter (Signed)
Returning your call. °

## 2015-12-25 NOTE — Telephone Encounter (Signed)
Samples of Effient provided at [facility] office for patient. Pt given instruction on intended use, SE's & warnings. Pt given directions to [facility] office for sample pickup.  Aware to call for any further needs.

## 2015-12-25 NOTE — Telephone Encounter (Signed)
Pt contacted, made aware samples available for pickup at [facility] office. He is aware of office location.

## 2015-12-25 NOTE — Telephone Encounter (Signed)
Left voice mail on identified mail box to call back our office to review discharge instructions from the hospital

## 2015-12-25 NOTE — Telephone Encounter (Signed)
TCM complete Patient contacted regarding discharge from blank on blank.   Patient understands to follow up with provider on 3/2 at 0830 am at [facility] location.  Patient understands discharge instructions  Patient understands medications and regiment Patient understands to bring all medications to this visit  Would like effient samples.Will pick up at [facility] front desk

## 2015-12-26 DIAGNOSIS — K5790 Diverticulosis of intestine, part unspecified, without perforation or abscess without bleeding: Secondary | ICD-10-CM | POA: Diagnosis not present

## 2015-12-26 DIAGNOSIS — R262 Difficulty in walking, not elsewhere classified: Secondary | ICD-10-CM | POA: Diagnosis not present

## 2015-12-26 DIAGNOSIS — Z96653 Presence of artificial knee joint, bilateral: Secondary | ICD-10-CM | POA: Diagnosis not present

## 2015-12-26 DIAGNOSIS — K219 Gastro-esophageal reflux disease without esophagitis: Secondary | ICD-10-CM | POA: Diagnosis not present

## 2015-12-26 DIAGNOSIS — E1140 Type 2 diabetes mellitus with diabetic neuropathy, unspecified: Secondary | ICD-10-CM | POA: Diagnosis not present

## 2015-12-26 DIAGNOSIS — G4733 Obstructive sleep apnea (adult) (pediatric): Secondary | ICD-10-CM | POA: Diagnosis not present

## 2015-12-26 DIAGNOSIS — Z48812 Encounter for surgical aftercare following surgery on the circulatory system: Secondary | ICD-10-CM | POA: Diagnosis not present

## 2015-12-26 DIAGNOSIS — I25110 Atherosclerotic heart disease of native coronary artery with unstable angina pectoris: Secondary | ICD-10-CM | POA: Diagnosis not present

## 2015-12-26 DIAGNOSIS — M6281 Muscle weakness (generalized): Secondary | ICD-10-CM | POA: Diagnosis not present

## 2015-12-26 DIAGNOSIS — Z951 Presence of aortocoronary bypass graft: Secondary | ICD-10-CM | POA: Diagnosis not present

## 2015-12-26 DIAGNOSIS — I5032 Chronic diastolic (congestive) heart failure: Secondary | ICD-10-CM | POA: Diagnosis not present

## 2015-12-26 DIAGNOSIS — N400 Enlarged prostate without lower urinary tract symptoms: Secondary | ICD-10-CM | POA: Diagnosis not present

## 2015-12-26 DIAGNOSIS — E669 Obesity, unspecified: Secondary | ICD-10-CM | POA: Diagnosis not present

## 2015-12-26 DIAGNOSIS — I272 Other secondary pulmonary hypertension: Secondary | ICD-10-CM | POA: Diagnosis not present

## 2015-12-26 DIAGNOSIS — Z6841 Body Mass Index (BMI) 40.0 and over, adult: Secondary | ICD-10-CM | POA: Diagnosis not present

## 2015-12-26 DIAGNOSIS — Z8701 Personal history of pneumonia (recurrent): Secondary | ICD-10-CM | POA: Diagnosis not present

## 2015-12-26 DIAGNOSIS — Z794 Long term (current) use of insulin: Secondary | ICD-10-CM | POA: Diagnosis not present

## 2015-12-26 DIAGNOSIS — E785 Hyperlipidemia, unspecified: Secondary | ICD-10-CM | POA: Diagnosis not present

## 2015-12-26 DIAGNOSIS — Z8673 Personal history of transient ischemic attack (TIA), and cerebral infarction without residual deficits: Secondary | ICD-10-CM | POA: Diagnosis not present

## 2015-12-26 DIAGNOSIS — M150 Primary generalized (osteo)arthritis: Secondary | ICD-10-CM | POA: Diagnosis not present

## 2015-12-26 DIAGNOSIS — Z7982 Long term (current) use of aspirin: Secondary | ICD-10-CM | POA: Diagnosis not present

## 2015-12-26 DIAGNOSIS — I110 Hypertensive heart disease with heart failure: Secondary | ICD-10-CM | POA: Diagnosis not present

## 2015-12-26 DIAGNOSIS — F418 Other specified anxiety disorders: Secondary | ICD-10-CM | POA: Diagnosis not present

## 2015-12-28 ENCOUNTER — Telehealth: Payer: Self-pay | Admitting: Family Medicine

## 2015-12-28 NOTE — Telephone Encounter (Signed)
Caller name:TATIANA Relationship to [place] Can be [phone] Pharmacy:  Reason for call:SHE WOULD LIKE ORDER FOR PT 1WEEK 1  2 TIMES A WEEK FOR 8 WEEKS  START FEB 26  GAIT TRAINING FALL PREVENTION

## 2015-12-28 NOTE — Telephone Encounter (Signed)
Verbal given for PT and OT.    KP

## 2015-12-28 NOTE — Progress Notes (Signed)
Cardiology Office Note   Date:  12/31/2015   ID:  Edward [name], DOB [birth year], MRN [id number]  PCP:  Garnet Koyanagi, DO  Cardiologist:  Dr. Tamala Julian    Posthospital follow-up    History of Present Illness: Edward [name] is a 75 y.o. male with a history of CAD s/p PCI/DES to mid LAD in 01/2015, HTN, HLD, OSA and poorly controlled DMT2 who presents to clinic for posthospital follow-up.  He has a history of CAD. Cath 01/30/2015 with 85% mid LAD lesion treated with DES, 30-50% distal RCA lesion, EF 55%. He had shortness of breath with Brilinta however this persisted despite switching him to Effient. Was seen by Dr. Lake Bells in 11/2015 and he felt shortness of breath was related to obesity and deconditioning. He had a stress test in 06/2015 with apical scar, no current ischemia: low risk test. He had been working with pulmonary rehabilitation and trying and increase activity and loose weight.   He was admitted to [facility] from 2/17-2/23/17 for dyspnea and chest pain worrisome for [place]. Upon presentation he had reported gaining 25 pounds over the past 6 months. it was decided to proceed with left and right heart cath: Right heart cath given his ongoing dyspnea and inability to measure pulmonary pressure by echo. He underwent L&RHC 12/21/15 which revealed 40-50% L main with cross sectional area by IVUS 5.96 mm at most severe (borderline), 85% ostial LCx (culprit lesion), widely patent mLAD stent, mod non critical disease in RCA, and acute on chronic D/CHF with markedly elevated filling pressures (LVEDP 30/PCWP 24) as well as mod pulm HTN. Plan was to consult [facility] for CABG vs stenting from the circumflex back into the left main, although this would require meticulous technique and could put the LAD at risk. [facility] saw patient and felt that CABG would probably result in pain relief but no survival benefit. Dr. Roxan Hockey discussed with Dr Tamala Julian who felt it was most appropriate to attempt to control sx  with medical therapy. If he fails optimal medical therapy then either CABG or PCI would be an acceptable option for control of angina. He was started on Imdur which was titrated up to 60 mg. He continued to have chest pain and was started on Ranexa 1075m BID. Additionally, he was started on IV Lasix due to elevated filling pressures on right heart cath and diuresed 6.6 L. His discharge weight was 277 pounds. His creatinine did bump a little after heart cath and IV diuresis.  He presents to clinic for follow-up. He has been doing much better since discharge. Chest pain is much better on imdur 689m He has not picked up the Ranexa yet. Yesterday he was completely chest pain free. He has one episode the day before. Still wanting to get back with pulm rehab. No LE edema, orthopnea PND. No dizziness or syncope. No blood in his stool or urine. He has been out walking around his house and feeling much better. He does have pain from a dental abscess. Dentist will not give him antibiotics or pull tooth without our approval.     Past Medical History  Diagnosis Date  . Hypertension   . CAD (coronary artery disease)     a. 01/2015 DES to LAD  b. 12/2015: [place]% oLCx lesion--> rx therapy.  . Marland KitchenIA (transient ischemic attack) 2002  . Dyslipidemia, goal LDL below 70 01/29/2014  . GERD (gastroesophageal reflux disease)   . Enlarged prostate   . DM type  2 (diabetes mellitus, type 2), insulin dependent 01/29/2014    fasting cbg 50-120 with new regimen  . Sleep apnea     does not use cpap (06/04/2015)  . Essential tremor     a. on proprnolol  . Adenomatous colon polyp 04/2011  . Internal hemorrhoid   . Chronic diastolic CHF (congestive heart failure) [facility])     Past Surgical History  Procedure Laterality Date  . Total knee arthroplasty Bilateral   . Lumbar disc surgery    . Cataract extraction Bilateral   . Joint replacement    . Back surgery    . Cataract extraction, bilateral Bilateral   . Shoulder  arthroscopy Right 07/30/2014    Procedure: Right Shoulder Arthroscopy, Debridement, Decompression, Manipulation Under Anesthesia;  Surgeon: Newt Minion, MD;  Location: [facility];  Service: Orthopedics;  Laterality: Right;  . Left heart catheterization with coronary angiogram N/A 01/28/2014    Procedure: LEFT HEART CATHETERIZATION WITH CORONARY ANGIOGRAM;  Surgeon: Sinclair Grooms, MD;  Location: [facility] CATH LAB;  Service: Cardiovascular;  Laterality: N/A;  . Left heart catheterization with coronary angiogram N/A 01/30/2015    Procedure: LEFT HEART CATHETERIZATION WITH CORONARY ANGIOGRAM;  Surgeon: Belva Crome, MD;  Location: [facility] CATH LAB;  Service: Cardiovascular;  Laterality: N/A;  . Cardiac catheterization  01/28/14    + CAD treat medically  . Coronary angioplasty with stent placement  01/30/2015    DES Promus  Premier to LAD by Dr Tamala Julian  . Cardiac catheterization N/A 12/21/2015    Procedure: Right/Left Heart Cath and Coronary Angiography;  Surgeon: Belva Crome, MD;  Location: [place] CV LAB;  Service: Cardiovascular;  Laterality: N/A;     Current Outpatient Prescriptions  Medication Sig Dispense Refill  . amLODipine (NORVASC) 10 MG tablet Take 10 mg by mouth daily.  1  . aspirin EC 81 MG tablet Take 81 mg by mouth daily.    . B-D INS SYR ULTRAFINE 1CC/31G 31G X 5/16" 1 ML MISC inject three times a day AS INSTRUCTED  1  . BD INSULIN SYRINGE U-500 31G X 6MM 0.5 ML MISC   1  . Blood Glucose Monitoring Suppl (ONE TOUCH ULTRA SYSTEM KIT) W/DEVICE KIT Use to test blood sugar daily as instructed. Dx code: E11.59 1 each 0  . Ciclopirox 1 % shampoo Apply 1 application topically at bedtime. At night after showering  0  . clonazePAM (KLONOPIN) 1 MG tablet Take 1 tablet (1 mg total) by mouth at bedtime as needed for anxiety. 30 tablet 2  . dextromethorphan-guaiFENesin (MUCINEX DM) 30-600 MG 12hr tablet Take 1 tablet by mouth 2 (two) times daily. 30 tablet 0  . diclofenac sodium (VOLTAREN) 1 % GEL Apply  2 g topically 4 (four) times daily.    Marland Kitchen dicyclomine (BENTYL) 10 MG capsule Take 1 capsule (10 mg total) by mouth 3 (three) times daily before meals. 90 capsule 5  . esomeprazole (NEXIUM) 40 MG capsule Take 40 mg by mouth daily at 12 noon.    . fenofibrate 160 MG tablet Take 160 mg by mouth daily.  0  . Fluticasone Furoate-Vilanterol (BREO ELLIPTA) 100-25 MCG/INH AEPB Inhale 1 puff into the lungs daily. 60 each 5  . furosemide (LASIX) 40 MG tablet Take 1 tablet (40 mg total) by mouth daily. 90 tablet 2  . glucose blood test strip Use to test blood sugar 4 times daily instructed. Dx: E11.59 125 each 5  . insulin NPH-regular Human (NOVOLIN 70/30) (70-30) 100  UNIT/ML injection Inject 40-60 Units into the skin 2 (two) times daily with a meal. Patient stated that he only take 70/30 60 units QPM with supper Per home medication list: Prescribed 40 units in the morning, 60 units in the evening    . insulin regular (NOVOLIN R,HUMULIN R) 100 units/mL injection Inject 40 Units into the skin 3 (three) times daily before meals.    . Insulin Syringes, Disposable, U-100 1 ML MISC Use 4x a day 200 each 11  . isosorbide mononitrate (IMDUR) 60 MG 24 hr tablet Take 1 tablet (60 mg total) by mouth daily. 30 tablet 6  . metoprolol (LOPRESSOR) 50 MG tablet Take 1 tablet (50 mg total) by mouth 2 (two) times daily. 60 tablet 6  . nitroGLYCERIN (NITROSTAT) 0.4 MG SL tablet Place 1 tablet (0.4 mg total) under the tongue every 5 (five) minutes as needed for chest pain (CP or SOB). 25 tablet 4  . Polyvinyl Alcohol-Povidone (REFRESH OP) Place 1 drop into both eyes daily as needed (for dry eyes). Reported on 12/25/2015    . potassium chloride SA (K-DUR,KLOR-CON) 20 MEQ tablet Take 1 tablet (20 mEq total) by mouth daily. 30 tablet 11  . prasugrel (EFFIENT) 10 MG TABS tablet Take 1 tablet (10 mg total) by mouth daily. 56 tablet 0  . pravastatin (PRAVACHOL) 40 MG tablet Take 1 tablet (40 mg total) by mouth daily. 30 tablet 5  .  propranolol (INDERAL) 40 MG tablet Take 1 tablet (40 mg total) by mouth 2 (two) times daily. 60 tablet 11  . VENTOLIN HFA 108 (90 BASE) MCG/ACT inhaler Inhale 2 puffs into the lungs every 6 (six) hours as needed. (SHORTNESS OR BREATH / WHEEZING)  0   No current facility-administered medications for this visit.    Allergies:   Review of patient's allergies indicates no known allergies.    Social History:  The patient  reports that he has never smoked. He has never used smokeless tobacco. He reports that he does not drink alcohol or use illicit drugs.   Family History:  The patient's family history includes Alzheimer's disease in his sister; Asthma in his brother; CAD in his brother and sister; Diabetes in his other; Prostate cancer in his brother.    ROS:  Please see the history of present illness.   Otherwise, review of systems are positive for none.   All other systems are reviewed and negative.    PHYSICAL EXAM: VS:  BP 170/72 mmHg  Pulse 62  Ht _0  (1.702 m)  Wt 283 lb 9.6 oz (128.64 kg)  BMI 44.41 kg/m2 , BMI Body mass index is 44.41 kg/(m^2). GEN: Well nourished, well developed, in no acute distressobese HEENT: normal Neck: no JVD, carotid bruits, or masses Cardiac:RRR; no murmurs, rubs, or gallops,no edema  Respiratory:  clear to auscultation bilaterally, normal work of breathing GI: soft, nontender, nondistended, + BS MS: no deformity or atrophy Skin: warm and dry, no rash Neuro:  Strength and sensation are intact Psych: euthymic mood, full affect   EKG:  EKG is ordered today. The ekg ordered today demonstrates sinus with 1st deg AV block HR 62, septal Q waves   Recent Labs: 07/24/2015: Pro B Natriuretic peptide (BNP) 24.0 12/18/2015: B Natriuretic Peptide 43.8 12/19/2015: ALT 18 12/21/2015: Hemoglobin 11.9*; Platelets 190 12/23/2015: BUN 18; Creatinine, Ser 1.53*; Potassium 4.2; Sodium 138    Lipid Panel    Component Value Date/Time   CHOL 172 12/18/2015 1830    TRIG 117 12/18/2015  1830   HDL 34* 12/18/2015 1830   CHOLHDL 5.1 12/18/2015 1830   VLDL 23 12/18/2015 1830   LDLCALC 115* 12/18/2015 1830   LDLDIRECT 144.0 07/08/2015 1344      Wt Readings from Last 3 Encounters:  12/31/15 283 lb 9.6 oz (128.64 kg)  12/24/15 277 lb 8 oz (125.873 kg)  12/08/15 289 lb 3.2 oz (131.18 kg)      Other studies Reviewed: Additional studies/ records that were reviewed today include: L/RHC, 2D ECHO. Review of the above records demonstrates:   12/21/15 Procedures    Right/Left Heart Cath and Coronary Angiography    Conclusion    1. Dist RCA lesion, 40% stenosed. 2. Prox RCA to Mid RCA lesion, 25% stenosed. 3. Ost Cx lesion, 85% stenosed. 4. Mid LAD lesion, 40% stenosed. 5. Mid LAD to Dist LAD lesion, 5% stenosed. The lesion was previously treated with a stent (unknown type). 6. Ost 1st Diag lesion, 75% stenosed. 7. LM lesion, 50% stenosed. 8. The left ventricular systolic function is normal.   40-50% distal left main with cross-sectional area by intravascular ultrasound at most severe narrowing 5.96 mm (borderline).  85% ostial circumflex, driving the patient's presentation with class III/IV angina pectoris.  Widely patent mid LAD stent placed 10 months ago .  Moderate, noncritical disease in the native right coronary.  Acute on chronic diastolic heart failure with marked elevation in filling pressure (LVEDP 30 mmHg and mean PCWP 24 mmHg).  Moderate pulmonary hypertension.  Recommendations:   Heart team approach with TCTS consult for consideration of coronary grafting.  If felt to be a high surgical risk, could consider stenting from the circumflex back into the left main, although this will require meticulous technique and could put the LAD at risk.  Most prudent approach at this time is probably intensified medical therapy and PCI or CABG only if meds fail.  More aggressive diuresis and add LA nitrate.      2D ECHO:  12/18/2015 LV EF: 60% -  65% Study Conclusions - Left ventricle: The cavity size was normal. There was moderate concentric hypertrophy. Systolic function was normal. The estimated ejection fraction was in the range of 60% to 65%. Wall motion was normal; there were no regional wall motion abnormalities. There was an increased relative contribution of atrial contraction to ventricular filling. Doppler parameters are consistent with abnormal left ventricular relaxation (grade 1 diastolic dysfunction). - Mitral valve: There was trivial regurgitation.        ASSESSMENT AND PLAN:  Edward [name] is a 75 y.o. male with a history of CAD s/p PCI/DES to mid LAD in 01/2015, HTN, HLD, OSA and poorly controlled DMT2 who presents to clinic for posthospital follow-up.  CAD: underwent L&RHC 12/21/15 which revealed 40-50% L main with cross sectional area by IVUS 5.96 mm at most severe (borderline), 85% ostial LCx (culprit lesion), widely patent mLAD stent, mod non critical disease in RCA, and acute on chronic D/CHF with markedly elevated filling pressures (LVEDP 30/PCWP 24) as well as mod pulm HTN. Ultimately, it was decided to continue medical therapy for chest pain. Continue Imdur 60 mg and Ranexa 1000 mg twice a day. If he fails medical therapy, we will have to discuss high-risk PCI or CABG. I have contacted the office to have samples of Ranexa made available to the patient. He has not picked this up yet, but chest pain is much better controlled on imdur 66m daily.  -- Continue aspirin, Effient, statin, beta blocker.   Acute  on chronic diastolic CHF: as above RHC with acute on chronic D/CHF with markedly elevated filling pressures (LVEDP 30/PCWP 24) as well as mod pulm HTN. Diuresed with IV lasix. Discharge weight 277 lbs. Creat bumped a little 1.25--> 1.54 with diuresis and after contrast exposure, but appeared stable. Discharged on 40 mg Lasix daily. He appears euvolemic. I will obtain  BMET today.   HTN: BP is high today but he did not take his AM meds ( was rushing to get here). Continue on current regimen of amlodipine 86m daily, Lopressor 561mBID, imdur 6069maily, and propranolol 46m62mD.   HLD: Lipid panel 12/18/2015 Chol 172, trig 117, HDL 34, LDL 115. Continue statin  DM II: poorly controlled. Follow with PCP.   Essential tremor: continue propranolol  Dental work for tooth abscess : i have given him the okay to use ABx but will ask Dr. SmitTamala Julianut coming off the Effient for his tooth to be pulled. He has been on ASA/Effient since DES placement to his mLAD in 01/2015.  Pulmonary Rehab: he is wanting to restart this but they are reluctant to take him back until we get his chest pain under control. I will see him back next month and if chest pain is better, I will write them back   Current medicines are reviewed at length with the patient today.  The patient does not have concerns regarding medicines.  The following changes have been made:  no change  Labs/ tests ordered today include:   Orders Placed This Encounter  Procedures  . Basic Metabolic Panel (BMET)  . EKG 12-Lead     Disposition:   FU with me on 01/28/16 Signed, THCrista Luria2/2017 8:35 AM    [facility] [address]  [id number]: [phone]: [phone]

## 2015-12-29 ENCOUNTER — Ambulatory Visit (HOSPITAL_COMMUNITY): Payer: Medicare Other

## 2015-12-29 ENCOUNTER — Telehealth: Payer: Self-pay | Admitting: Interventional Cardiology

## 2015-12-29 DIAGNOSIS — M150 Primary generalized (osteo)arthritis: Secondary | ICD-10-CM | POA: Diagnosis not present

## 2015-12-29 DIAGNOSIS — M6281 Muscle weakness (generalized): Secondary | ICD-10-CM | POA: Diagnosis not present

## 2015-12-29 DIAGNOSIS — I25110 Atherosclerotic heart disease of native coronary artery with unstable angina pectoris: Secondary | ICD-10-CM | POA: Diagnosis not present

## 2015-12-29 DIAGNOSIS — R262 Difficulty in walking, not elsewhere classified: Secondary | ICD-10-CM | POA: Diagnosis not present

## 2015-12-29 DIAGNOSIS — E1140 Type 2 diabetes mellitus with diabetic neuropathy, unspecified: Secondary | ICD-10-CM | POA: Diagnosis not present

## 2015-12-29 DIAGNOSIS — Z48812 Encounter for surgical aftercare following surgery on the circulatory system: Secondary | ICD-10-CM | POA: Diagnosis not present

## 2015-12-29 NOTE — Telephone Encounter (Signed)
He wanted you to know pt is starting Cardiac Rehab on Thursday and will be driving. Pt no longer is homebound and will be followed by Cardiac Rehab at [facility] was doing Physicial and Occupational Therapy at home with [facility].

## 2015-12-31 ENCOUNTER — Ambulatory Visit (HOSPITAL_COMMUNITY): Payer: Medicare Other

## 2015-12-31 ENCOUNTER — Ambulatory Visit (INDEPENDENT_AMBULATORY_CARE_PROVIDER_SITE_OTHER): Payer: Medicare Other | Admitting: Physician Assistant

## 2015-12-31 ENCOUNTER — Encounter: Payer: Self-pay | Admitting: *Deleted

## 2015-12-31 ENCOUNTER — Encounter: Payer: Self-pay | Admitting: Physician Assistant

## 2015-12-31 ENCOUNTER — Telehealth: Payer: Self-pay

## 2015-12-31 VITALS — BP 170/72 | HR 62 | Ht 67.0 in | Wt 283.6 lb

## 2015-12-31 DIAGNOSIS — M6281 Muscle weakness (generalized): Secondary | ICD-10-CM | POA: Diagnosis not present

## 2015-12-31 DIAGNOSIS — I10 Essential (primary) hypertension: Secondary | ICD-10-CM | POA: Diagnosis not present

## 2015-12-31 DIAGNOSIS — E1140 Type 2 diabetes mellitus with diabetic neuropathy, unspecified: Secondary | ICD-10-CM | POA: Diagnosis not present

## 2015-12-31 DIAGNOSIS — I25118 Atherosclerotic heart disease of native coronary artery with other forms of angina pectoris: Secondary | ICD-10-CM

## 2015-12-31 DIAGNOSIS — I209 Angina pectoris, unspecified: Secondary | ICD-10-CM | POA: Diagnosis not present

## 2015-12-31 DIAGNOSIS — Z48812 Encounter for surgical aftercare following surgery on the circulatory system: Secondary | ICD-10-CM | POA: Diagnosis not present

## 2015-12-31 DIAGNOSIS — I2510 Atherosclerotic heart disease of native coronary artery without angina pectoris: Secondary | ICD-10-CM

## 2015-12-31 DIAGNOSIS — E785 Hyperlipidemia, unspecified: Secondary | ICD-10-CM

## 2015-12-31 DIAGNOSIS — Z9861 Coronary angioplasty status: Secondary | ICD-10-CM

## 2015-12-31 DIAGNOSIS — R0600 Dyspnea, unspecified: Secondary | ICD-10-CM

## 2015-12-31 DIAGNOSIS — I25110 Atherosclerotic heart disease of native coronary artery with unstable angina pectoris: Secondary | ICD-10-CM | POA: Diagnosis not present

## 2015-12-31 DIAGNOSIS — R262 Difficulty in walking, not elsewhere classified: Secondary | ICD-10-CM | POA: Diagnosis not present

## 2015-12-31 DIAGNOSIS — M150 Primary generalized (osteo)arthritis: Secondary | ICD-10-CM | POA: Diagnosis not present

## 2015-12-31 LAB — BASIC METABOLIC PANEL
BUN: 18 mg/dL (ref 7–25)
CHLORIDE: 103 mmol/L (ref 98–110)
CO2: 31 mmol/L (ref 20–31)
Calcium: 9 mg/dL (ref 8.6–10.3)
Creat: 1.33 mg/dL — ABNORMAL HIGH (ref 0.70–1.18)
GLUCOSE: 269 mg/dL — AB (ref 65–99)
POTASSIUM: 4 mmol/L (ref 3.5–5.3)
Sodium: 141 mmol/L (ref 135–146)

## 2015-12-31 NOTE — Telephone Encounter (Signed)
Samples of Ranexa 1000mg  2 packs left at front desk for patient. Left him a message to this affect.

## 2015-12-31 NOTE — Patient Instructions (Addendum)
Medication Instructions:  Your physician recommends that you continue on your current medications as directed. Please refer to the Current Medication list given to you today.   Labwork: TODAY:  BMET  Testing/Procedures: None ordered  Follow-Up: Your physician recommends that you schedule a follow-up appointment in: [place], PA-C   Any Other Special Instructions Will Be Listed Below (If Applicable).    If you need a refill on your cardiac medications before your next appointment, please call your pharmacy.

## 2016-01-01 ENCOUNTER — Encounter: Payer: Self-pay | Admitting: Family Medicine

## 2016-01-01 ENCOUNTER — Ambulatory Visit (INDEPENDENT_AMBULATORY_CARE_PROVIDER_SITE_OTHER): Payer: Medicare Other | Admitting: Family Medicine

## 2016-01-01 VITALS — BP 130/72 | HR 65 | Temp 98.0°F | Ht 67.0 in | Wt 283.6 lb

## 2016-01-01 DIAGNOSIS — I2510 Atherosclerotic heart disease of native coronary artery without angina pectoris: Secondary | ICD-10-CM | POA: Diagnosis not present

## 2016-01-01 DIAGNOSIS — Z9861 Coronary angioplasty status: Secondary | ICD-10-CM

## 2016-01-01 DIAGNOSIS — I25119 Atherosclerotic heart disease of native coronary artery with unspecified angina pectoris: Secondary | ICD-10-CM | POA: Diagnosis not present

## 2016-01-01 DIAGNOSIS — I10 Essential (primary) hypertension: Secondary | ICD-10-CM

## 2016-01-01 DIAGNOSIS — E1159 Type 2 diabetes mellitus with other circulatory complications: Secondary | ICD-10-CM | POA: Diagnosis not present

## 2016-01-01 DIAGNOSIS — G4733 Obstructive sleep apnea (adult) (pediatric): Secondary | ICD-10-CM

## 2016-01-01 DIAGNOSIS — E876 Hypokalemia: Secondary | ICD-10-CM

## 2016-01-01 DIAGNOSIS — K219 Gastro-esophageal reflux disease without esophagitis: Secondary | ICD-10-CM

## 2016-01-01 DIAGNOSIS — Z794 Long term (current) use of insulin: Secondary | ICD-10-CM

## 2016-01-01 DIAGNOSIS — K589 Irritable bowel syndrome without diarrhea: Secondary | ICD-10-CM

## 2016-01-01 DIAGNOSIS — E785 Hyperlipidemia, unspecified: Secondary | ICD-10-CM | POA: Diagnosis not present

## 2016-01-01 DIAGNOSIS — F419 Anxiety disorder, unspecified: Secondary | ICD-10-CM

## 2016-01-01 DIAGNOSIS — I200 Unstable angina: Secondary | ICD-10-CM

## 2016-01-01 DIAGNOSIS — R0600 Dyspnea, unspecified: Secondary | ICD-10-CM

## 2016-01-01 DIAGNOSIS — I509 Heart failure, unspecified: Secondary | ICD-10-CM

## 2016-01-01 MED ORDER — PRAVASTATIN SODIUM 40 MG PO TABS: 40.0000 mg | ORAL_TABLET | Freq: Every day | ORAL | Status: DC

## 2016-01-01 MED ORDER — AMLODIPINE BESYLATE 10 MG PO TABS: 10.0000 mg | ORAL_TABLET | Freq: Every day | ORAL | Status: DC

## 2016-01-01 MED ORDER — ISOSORBIDE MONONITRATE ER 60 MG PO TB24: 60.0000 mg | ORAL_TABLET | Freq: Every day | ORAL | Status: DC

## 2016-01-01 MED ORDER — DICYCLOMINE HCL 10 MG PO CAPS: 10.0000 mg | ORAL_CAPSULE | Freq: Three times a day (TID) | ORAL | Status: DC

## 2016-01-01 MED ORDER — POTASSIUM CHLORIDE CRYS ER 20 MEQ PO TBCR: 20.0000 meq | EXTENDED_RELEASE_TABLET | Freq: Every day | ORAL | Status: DC

## 2016-01-01 MED ORDER — NITROGLYCERIN 0.4 MG SL SUBL: 0.4000 mg | SUBLINGUAL_TABLET | SUBLINGUAL | Status: DC | PRN

## 2016-01-01 MED ORDER — FENOFIBRATE 160 MG PO TABS: 160.0000 mg | ORAL_TABLET | Freq: Every day | ORAL | Status: DC

## 2016-01-01 MED ORDER — INSULIN REGULAR HUMAN 100 UNIT/ML IJ SOLN: 40.0000 [IU] | Freq: Three times a day (TID) | INTRAMUSCULAR | Status: DC

## 2016-01-01 MED ORDER — RANOLAZINE ER 1000 MG PO TB12
500.0000 mg | ORAL_TABLET | Freq: Two times a day (BID) | ORAL | Status: DC
Start: 2016-01-01 — End: 2016-03-21

## 2016-01-01 MED ORDER — PRASUGREL HCL 10 MG PO TABS: 10.0000 mg | ORAL_TABLET | Freq: Every day | ORAL | Status: DC

## 2016-01-01 MED ORDER — FLUTICASONE FUROATE-VILANTEROL 100-25 MCG/INH IN AEPB: 1.0000 | INHALATION_SPRAY | Freq: Every day | RESPIRATORY_TRACT | Status: DC

## 2016-01-01 MED ORDER — ESOMEPRAZOLE MAGNESIUM 40 MG PO CPDR: 40.0000 mg | DELAYED_RELEASE_CAPSULE | Freq: Every day | ORAL | Status: DC

## 2016-01-01 MED ORDER — CLONAZEPAM 1 MG PO TABS: 1.0000 mg | ORAL_TABLET | Freq: Every evening | ORAL | Status: DC | PRN

## 2016-01-01 MED ORDER — METOPROLOL TARTRATE 50 MG PO TABS: 50.0000 mg | ORAL_TABLET | Freq: Two times a day (BID) | ORAL | Status: DC

## 2016-01-01 MED ORDER — FUROSEMIDE 40 MG PO TABS: 40.0000 mg | ORAL_TABLET | Freq: Every day | ORAL | Status: DC

## 2016-01-01 MED ORDER — INSULIN NPH ISOPHANE & REGULAR (70-30) 100 UNIT/ML ~~LOC~~ SUSP: 40.0000 [IU] | Freq: Two times a day (BID) | SUBCUTANEOUS | Status: DC

## 2016-01-01 NOTE — Progress Notes (Signed)
Pre visit review using our clinic review tool, if applicable. No additional management support is needed unless otherwise documented below in the visit note. 

## 2016-01-01 NOTE — Patient Instructions (Signed)
Coronary Artery Disease, Male  Coronary artery disease (CAD) is a process in which the blood vessels of the heart (coronary arteries) become narrow or blocked. The narrowing or blockage can lead to decreased blood flow to the heart muscle (angina). Prolonged reduced blood flow can cause a heart attack (myocardial infarction, MI). Because CAD is the leading cause of death in men, it is important to understand what causes this condition and how it is treated.  CAUSES  Atherosclerosis is the cause of CAD. This is the buildup of fat and cholesterol (plaque) on the inside of the arteries. Over time, the plaque may narrow or block the artery, and this will lessen blood flow to the heart. Plaque can also become weak and break off within a coronary artery to form a clot and cause a sudden blockage.  RISK FACTORS  Many risk factors increase your chances of getting CAD, including:  · High cholesterol levels.  · High blood pressure (hypertension).  · Tobacco use.  · Diabetes.  · Age. Men over age 45 are at a greater risk of CAD.  · Gender. Men often develop CAD earlier in life than women.  · Family history of CAD.  · Obesity.  · Lack of exercise.  · A diet high in saturated fats.  SYMPTOMS   Many people do not experience any symptoms during the early stages of CAD. As the condition progresses, symptoms may include:   · Chest pain.  ¨ The pain can be described as a crushing or squeezing in the chest, or a tightness, pressure, fullness, or heaviness in the chest.  ¨ The pain can last more than a few minutes or can stop and recur.   · Pain in the arms, neck, jaw, or back.  · Unexplained heartburn or indigestion.  · Shortness of breath.  · Nausea.  · Sudden cold sweats.   Less common symptoms of CAD in men can include:  · Fatigue.  · Unexplained feelings of nervousness or anxiety.  · Weakness.  · Diarrhea.  · Sudden light-headedness.  DIAGNOSIS   Tests to diagnose CAD may include:  · ECG (electrocardiogram).  · Exercise stress  test. This looks for signs of blockage when the heart is being exercised.  · Pharmacologic stress test. This test looks for signs of blockage when the heart is being stressed with a medicine.  · Blood tests.  · Coronary angiogram. This is a procedure to look at the coronary arteries to see if there is any blockage.  TREATMENT  The treatment of CAD may include the following:  · Healthy behavioral changes to reduce or control risk factors.  · Medicine.  · Coronary stenting. A stent helps to keep an artery open.  · Coronary angioplasty. This procedure widens a narrowed or blocked artery.  · Coronary artery bypass surgery. This will allow your blood to pass the blockage (bypass) to reach your heart.  HOME CARE INSTRUCTIONS  · Take medicines only as directed by your health care provider.  · Do not take the following medicines unless your health care provider approves:    Nonsteroidal anti-inflammatory drugs (NSAIDs), such as ibuprofen, naproxen, or celecoxib.    Vitamin supplements that contain vitamin A, vitamin E, or both.  · Manage other health conditions such as hypertension and diabetes as directed by your health care provider.  · Follow a heart-healthy diet. A dietitian can help to educate you about healthy food options and changes.  · Use healthy cooking methods such as roasting, grilling, broiling,   baking, poaching, steaming, or stir-frying. Talk to a dietitian to learn more about healthy cooking methods.  · Follow an exercise program approved by your health care provider.  · Maintain a healthy weight. Lose weight as approved by your health care provider.  · Plan rest periods when fatigued.  · Learn to manage stress.  · Do not use any tobacco products, including cigarettes, chewing tobacco, or electronic cigarettes. If you need help quitting, ask your health care provider.  · If you drink alcohol, and your health care provider approves, limit your alcohol intake to no more than 1 drink per day. One drink equals  12 ounces of beer, 5 ounces of wine, or 1½ ounces of hard liquor.  · Stop illegal drug use.  · Your health care provider may ask you to monitor your blood pressure. A blood pressure reading consists of a higher number over a lower number, such as 110 over 72, which is written as 110/72. Ideally, your blood pressure should be:    Below 140/90 if you have no other medical conditions.    Below 130/80 if you have diabetes or kidney disease.  · Keep all follow-up visits as directed by your health care provider. This is important.  SEEK IMMEDIATE MEDICAL CARE IF:  · You have pain in your chest, neck, arm, jaw, stomach, or back that lasts more than a few minutes, is recurring, or is unrelieved by taking medicine under your tongue (sublingual nitroglycerin).  · You have profuse sweating without cause.  · You have unexplained:    Heartburn or indigestion.    Shortness of breath or difficulty breathing.    Nausea or vomiting.    Fatigue.    Feelings of nervousness or anxiety.    Weakness.    Diarrhea.  · You have sudden light-headedness or dizziness.  · You faint.  These symptoms may represent a serious problem that is an emergency. Do not wait to see if the symptoms will go away. Get medical help right away. Call your local emergency services (911 in the [place] Do not drive yourself to the hospital.     This information is not intended to replace advice given to you by your health care provider. Make sure you discuss any questions you have with your health care provider.     Document Released: 05/14/2014 Document Reviewed: 05/14/2014  [employer]vier Interactive Patient Education ©2016 [employer]

## 2016-01-01 NOTE — Progress Notes (Signed)
Patient ID: Edward [name], male    DOB: 1941-02-17  Age: 75 y.o. MRN: [id number]    Subjective:  Subjective HPI Edward [name] presents for f/u hosp. For cp and sob.  Pt is feeling som much better.  Review of Systems  Constitutional: Negative for diaphoresis, appetite change, fatigue and unexpected weight change.  Eyes: Negative for pain, redness and visual disturbance.  Respiratory: Negative for cough, chest tightness, shortness of breath and wheezing.   Cardiovascular: Negative for chest pain, palpitations and leg swelling.  Endocrine: Negative for cold intolerance, heat intolerance, polydipsia, polyphagia and polyuria.  Genitourinary: Negative for dysuria, frequency and difficulty urinating.  Neurological: Negative for dizziness, light-headedness, numbness and headaches.    History Past Medical History  Diagnosis Date  . Hypertension   . CAD (coronary artery disease)     a. 01/2015 DES to LAD  b. 12/2015: [place] 85% oLCx lesion--> rx therapy.  Marland Kitchen TIA (transient ischemic attack) 2002  . Dyslipidemia, goal LDL below 70 01/29/2014  . GERD (gastroesophageal reflux disease)   . Enlarged prostate   . DM type 2 (diabetes mellitus, type 2), insulin dependent 01/29/2014    fasting cbg 50-120 with new regimen  . Sleep apnea     does not use cpap (06/04/2015)  . Essential tremor     a. on proprnolol  . Adenomatous colon polyp 04/2011  . Internal hemorrhoid   . Chronic diastolic CHF (congestive heart failure) (HCC)     He has past surgical history that includes Total knee arthroplasty (Bilateral); Lumbar disc surgery; Cataract extraction (Bilateral); Joint replacement; Back surgery; Cataract extraction, bilateral (Bilateral); Shoulder arthroscopy (Right, 07/30/2014); left heart catheterization with coronary angiogram (N/A, 01/28/2014); left heart catheterization with coronary angiogram (N/A, 01/30/2015); Cardiac catheterization (01/28/14); Coronary angioplasty with stent (01/30/2015); and Cardiac  catheterization (N/A, 12/21/2015).   His family history includes Alzheimer's disease in his sister; Asthma in his brother; CAD in his brother and sister; Diabetes in his other; Prostate cancer in his brother.He reports that he has never smoked. He has never used smokeless tobacco. He reports that he does not drink alcohol or use illicit drugs.  Current Outpatient Prescriptions on File Prior to Visit  Medication Sig Dispense Refill  . aspirin EC 81 MG tablet Take 81 mg by mouth daily.    . B-D INS SYR ULTRAFINE 1CC/31G 31G X 5/16" 1 ML MISC inject three times a day AS INSTRUCTED  1  . BD INSULIN SYRINGE U-500 31G X 6MM 0.5 ML MISC   1  . Blood Glucose Monitoring Suppl (ONE TOUCH ULTRA SYSTEM KIT) W/DEVICE KIT Use to test blood sugar daily as instructed. Dx code: E11.59 1 each 0  . Ciclopirox 1 % shampoo Apply 1 application topically at bedtime. At night after showering  0  . dextromethorphan-guaiFENesin (MUCINEX DM) 30-600 MG 12hr tablet Take 1 tablet by mouth 2 (two) times daily. 30 tablet 0  . diclofenac sodium (VOLTAREN) 1 % GEL Apply 2 g topically 4 (four) times daily.    Marland Kitchen glucose blood test strip Use to test blood sugar 4 times daily instructed. Dx: E11.59 125 each 5  . Insulin Syringes, Disposable, U-100 1 ML MISC Use 4x a day 200 each 11  . Polyvinyl Alcohol-Povidone (REFRESH OP) Place 1 drop into both eyes daily as needed (for dry eyes). Reported on 12/25/2015    . propranolol (INDERAL) 40 MG tablet Take 1 tablet (40 mg total) by mouth 2 (two) times daily. 60 tablet 11  .  VENTOLIN HFA 108 (90 BASE) MCG/ACT inhaler Inhale 2 puffs into the lungs every 6 (six) hours as needed. (SHORTNESS OR BREATH / WHEEZING)  0   No current facility-administered medications on file prior to visit.     Objective:  Objective Physical Exam  Constitutional: He is oriented to person, place, and time. Vital signs are normal. He appears well-developed and well-nourished. He is sleeping.  HENT:  Head:  Normocephalic and atraumatic.  Mouth/Throat: Oropharynx is clear and moist.  Eyes: EOM are normal. Pupils are equal, round, and reactive to light.  Neck: Normal range of motion. Neck supple. No thyromegaly present.  Cardiovascular: Normal rate and regular rhythm.   No murmur heard. Pulmonary/Chest: Effort normal and breath sounds normal. No respiratory distress. He has no wheezes. He has no rales. He exhibits no tenderness.  Musculoskeletal: He exhibits no edema or tenderness.  Neurological: He is alert and oriented to person, place, and time.  Skin: Skin is warm and dry.  Psychiatric: He has a normal mood and affect. His behavior is normal. Judgment and thought content normal.  Nursing note and vitals reviewed.  BP 130/72 mmHg  Pulse 65  Temp(Src) 98 F (36.7 C) (Oral)  Ht '5\' 7"'  (1.702 m)  Wt 283 lb 9.6 oz (128.64 kg)  BMI 44.41 kg/m2  SpO2 98% Wt Readings from Last 3 Encounters:  01/01/16 283 lb 9.6 oz (128.64 kg)  12/31/15 283 lb 9.6 oz (128.64 kg)  12/24/15 277 lb 8 oz (125.873 kg)     Lab Results  Component Value Date   WBC 6.1 12/21/2015   HGB 11.9* 12/21/2015   HCT 36.5* 12/21/2015   PLT 190 12/21/2015   GLUCOSE 269* 12/31/2015   CHOL 172 12/18/2015   TRIG 117 12/18/2015   HDL 34* 12/18/2015   LDLDIRECT 144.0 07/08/2015   LDLCALC 115* 12/18/2015   ALT 18 12/19/2015   AST 15 12/19/2015   NA 141 12/31/2015   K 4.0 12/31/2015   CL 103 12/31/2015   CREATININE 1.33* 12/31/2015   BUN 18 12/31/2015   CO2 31 12/31/2015   TSH 1.041 12/02/2014   PSA 0.74 02/11/2014   INR 1.10 12/21/2015   HGBA1C 8.9 10/19/2015   MICROALBUR 91.9* 02/11/2014    Dg Chest 2 View  12/18/2015  CLINICAL DATA:  Chest pain and shortness of breath EXAM: CHEST  2 VIEW COMPARISON:  Chest radiograph October 31, 2015 ; chest CT August 04, 2015 FINDINGS: There is no edema or consolidation. Heart is upper normal in size with pulmonary vascularity within normal limits. No adenopathy. There is mid  thoracic dextroscoliosis. There is degenerative change in the thoracic spine. IMPRESSION: No edema or consolidation.  Heart upper normal in size. Electronically Signed   By: Lowella Grip III M.D.   On: 12/18/2015 07:43     Assessment & Plan:  Plan I have changed Mr. Tiemann's insulin regular, fenofibrate, esomeprazole, and amLODipine. I am also having him start on ranolazine. Additionally, I am having him maintain his aspirin EC, ONE TOUCH ULTRA SYSTEM KIT, Ciclopirox, Polyvinyl Alcohol-Povidone (REFRESH OP), propranolol, Insulin Syringes (Disposable), VENTOLIN HFA, dextromethorphan-guaiFENesin, glucose blood, B-D INS SYR ULTRAFINE 1CC/31G, BD INSULIN SYRINGE U-500, diclofenac sodium, pravastatin, prasugrel, potassium chloride SA, nitroGLYCERIN, metoprolol, isosorbide mononitrate, insulin NPH-regular Human, furosemide, fluticasone furoate-vilanterol, dicyclomine, and clonazePAM.  Meds ordered this encounter  Medications  . ranolazine (RANEXA) 1000 MG SR tablet    Sig: Take 1 tablet (1,000 mg total) by mouth 2 (two) times daily.  . pravastatin (PRAVACHOL)  40 MG tablet    Sig: Take 1 tablet (40 mg total) by mouth daily.    Dispense:  30 tablet    Refill:  5  . prasugrel (EFFIENT) 10 MG TABS tablet    Sig: Take 1 tablet (10 mg total) by mouth daily.    Dispense:  56 tablet    Refill:  0  . potassium chloride SA (K-DUR,KLOR-CON) 20 MEQ tablet    Sig: Take 1 tablet (20 mEq total) by mouth daily.    Dispense:  30 tablet    Refill:  11  . nitroGLYCERIN (NITROSTAT) 0.4 MG SL tablet    Sig: Place 1 tablet (0.4 mg total) under the tongue every 5 (five) minutes as needed for chest pain (CP or SOB).    Dispense:  25 tablet    Refill:  4  . metoprolol (LOPRESSOR) 50 MG tablet    Sig: Take 1 tablet (50 mg total) by mouth 2 (two) times daily.    Dispense:  60 tablet    Refill:  6  . isosorbide mononitrate (IMDUR) 60 MG 24 hr tablet    Sig: Take 1 tablet (60 mg total) by mouth daily.    Dispense:   30 tablet    Refill:  6  . insulin regular (NOVOLIN R,HUMULIN R) 100 units/mL injection    Sig: Inject 0.4 mLs (40 Units total) into the skin 3 (three) times daily before meals.    Dispense:  10 mL  . insulin NPH-regular Human (NOVOLIN 70/30) (70-30) 100 UNIT/ML injection    Sig: Inject 40-60 Units into the skin 2 (two) times daily with a meal. Patient stated that he only take 70/30 60 units QPM with supper Per home medication list: Prescribed 40 units in the morning, 60 units in the evening    Dispense:  10 mL  . furosemide (LASIX) 40 MG tablet    Sig: Take 1 tablet (40 mg total) by mouth daily.    Dispense:  90 tablet    Refill:  2  . fluticasone furoate-vilanterol (BREO ELLIPTA) 100-25 MCG/INH AEPB    Sig: Inhale 1 puff into the lungs daily.    Dispense:  60 each    Refill:  5  . fenofibrate 160 MG tablet    Sig: Take 1 tablet (160 mg total) by mouth daily.    Refill:  0  . esomeprazole (NEXIUM) 40 MG capsule    Sig: Take 1 capsule (40 mg total) by mouth daily at 12 noon.  . dicyclomine (BENTYL) 10 MG capsule    Sig: Take 1 capsule (10 mg total) by mouth 3 (three) times daily before meals.    Dispense:  90 capsule    Refill:  5  . clonazePAM (KLONOPIN) 1 MG tablet    Sig: Take 1 tablet (1 mg total) by mouth at bedtime as needed for anxiety.    Dispense:  30 tablet    Refill:  2  . amLODipine (NORVASC) 10 MG tablet    Sig: Take 1 tablet (10 mg total) by mouth daily.    Refill:  1    Problem List Items Addressed This Visit    Dyslipidemia, goal LDL below 70 (Chronic)   Relevant Medications   ranolazine (RANEXA) 1000 MG SR tablet   pravastatin (PRAVACHOL) 40 MG tablet   prasugrel (EFFIENT) 10 MG TABS tablet   nitroGLYCERIN (NITROSTAT) 0.4 MG SL tablet   metoprolol (LOPRESSOR) 50 MG tablet   isosorbide mononitrate (IMDUR) 60 MG 24 hr  tablet   furosemide (LASIX) 40 MG tablet   fenofibrate 160 MG tablet   amLODipine (NORVASC) 10 MG tablet   DM type 2 (diabetes mellitus,  type 2), insulin dependent (Chronic)    Per endo      Relevant Medications   pravastatin (PRAVACHOL) 40 MG tablet   insulin regular (NOVOLIN R,HUMULIN R) 100 units/mL injection   insulin NPH-regular Human (NOVOLIN 70/30) (70-30) 100 UNIT/ML injection   HTN (hypertension) (Chronic)   Relevant Medications   ranolazine (RANEXA) 1000 MG SR tablet   pravastatin (PRAVACHOL) 40 MG tablet   prasugrel (EFFIENT) 10 MG TABS tablet   nitroGLYCERIN (NITROSTAT) 0.4 MG SL tablet   metoprolol (LOPRESSOR) 50 MG tablet   isosorbide mononitrate (IMDUR) 60 MG 24 hr tablet   furosemide (LASIX) 40 MG tablet   fenofibrate 160 MG tablet   amLODipine (NORVASC) 10 MG tablet   CAD S/P LAD DES 01/30/15   Relevant Medications   ranolazine (RANEXA) 1000 MG SR tablet   pravastatin (PRAVACHOL) 40 MG tablet   prasugrel (EFFIENT) 10 MG TABS tablet   nitroGLYCERIN (NITROSTAT) 0.4 MG SL tablet   metoprolol (LOPRESSOR) 50 MG tablet   isosorbide mononitrate (IMDUR) 60 MG 24 hr tablet   furosemide (LASIX) 40 MG tablet   fenofibrate 160 MG tablet   amLODipine (NORVASC) 10 MG tablet   OSA (obstructive sleep apnea)   Relevant Medications   prasugrel (EFFIENT) 10 MG TABS tablet   Dyspnea   Relevant Medications   prasugrel (EFFIENT) 10 MG TABS tablet   fluticasone furoate-vilanterol (BREO ELLIPTA) 100-25 MCG/INH AEPB   CAD (coronary artery disease) - Primary   Relevant Medications   ranolazine (RANEXA) 1000 MG SR tablet   pravastatin (PRAVACHOL) 40 MG tablet   nitroGLYCERIN (NITROSTAT) 0.4 MG SL tablet   metoprolol (LOPRESSOR) 50 MG tablet   isosorbide mononitrate (IMDUR) 60 MG 24 hr tablet   furosemide (LASIX) 40 MG tablet   fenofibrate 160 MG tablet   amLODipine (NORVASC) 10 MG tablet    Other Visit Diagnoses    Hyperlipidemia        Relevant Medications    ranolazine (RANEXA) 1000 MG SR tablet    pravastatin (PRAVACHOL) 40 MG tablet    nitroGLYCERIN (NITROSTAT) 0.4 MG SL tablet    metoprolol (LOPRESSOR)  50 MG tablet    isosorbide mononitrate (IMDUR) 60 MG 24 hr tablet    furosemide (LASIX) 40 MG tablet    fenofibrate 160 MG tablet    amLODipine (NORVASC) 10 MG tablet    Anxiety        Relevant Medications    clonazePAM (KLONOPIN) 1 MG tablet    IBS (irritable bowel syndrome)        Relevant Medications    esomeprazole (NEXIUM) 40 MG capsule    dicyclomine (BENTYL) 10 MG capsule    Hypokalemia        Relevant Medications    potassium chloride SA (K-DUR,KLOR-CON) 20 MEQ tablet    Gastroesophageal reflux disease, esophagitis presence not specified        Relevant Medications    esomeprazole (NEXIUM) 40 MG capsule    dicyclomine (BENTYL) 10 MG capsule    Congestive heart failure, unspecified congestive heart failure chronicity, unspecified congestive heart failure type (HCC)        Relevant Medications    ranolazine (RANEXA) 1000 MG SR tablet    pravastatin (PRAVACHOL) 40 MG tablet    nitroGLYCERIN (NITROSTAT) 0.4 MG SL tablet    metoprolol (LOPRESSOR)  50 MG tablet    isosorbide mononitrate (IMDUR) 60 MG 24 hr tablet    furosemide (LASIX) 40 MG tablet    fenofibrate 160 MG tablet    amLODipine (NORVASC) 10 MG tablet       Follow-up: Return in about 3 months (around 04/02/2016), or if symptoms worsen or fail to improve, for hyperlipidemia.  Garnet Koyanagi, DO

## 2016-01-01 NOTE — Assessment & Plan Note (Signed)
Per endo °

## 2016-01-02 ENCOUNTER — Telehealth: Payer: Self-pay | Admitting: Physician Assistant

## 2016-01-02 ENCOUNTER — Other Ambulatory Visit: Payer: Self-pay | Admitting: Family Medicine

## 2016-01-02 NOTE — Telephone Encounter (Signed)
     I spoke with Dr. Tamala Julian about his need for dental work (possible tooth extraction). It has been almost 1 year on DAPT with ASA/Effient after last DES placement in 01/2015. Dr Tamala Julian feels like he needs to be on Effient long term but that it would be okay to temporarily stop for dental extraction. I will have my nurse call the patient to let him know he is able to temporarily come of his DAPT for dental extraction. When he schedules the procedure, we will have him call us for further instructions on when to stop and resume.    Angelena Form PA-C  [facility]

## 2016-01-04 ENCOUNTER — Encounter: Payer: Self-pay | Admitting: Neurology

## 2016-01-04 ENCOUNTER — Ambulatory Visit (INDEPENDENT_AMBULATORY_CARE_PROVIDER_SITE_OTHER): Payer: Medicare Other | Admitting: Neurology

## 2016-01-04 VITALS — BP 140/80 | HR 77 | Ht 67.0 in | Wt 280.1 lb

## 2016-01-04 DIAGNOSIS — I200 Unstable angina: Secondary | ICD-10-CM | POA: Diagnosis not present

## 2016-01-04 DIAGNOSIS — G250 Essential tremor: Secondary | ICD-10-CM | POA: Diagnosis not present

## 2016-01-04 MED ORDER — PROPRANOLOL HCL 40 MG PO TABS: 40.0000 mg | ORAL_TABLET | Freq: Two times a day (BID) | ORAL | Status: DC

## 2016-01-04 NOTE — Patient Instructions (Addendum)
Continue propranolol 40mg  twice daily.  Please track your heart rate so I can determine whether we can increase your propranolol.  Please send me a MyChart message in 4 weeks.  Return to clinic 2-3 months

## 2016-01-04 NOTE — Progress Notes (Signed)
Follow-up Visit   Date: 01/04/2016    NIRAV SWEDA MRN: [id number] DOB: 29-Oct-1941   Interim History: Edward [name] is a 75 y.o. right-handed African American male with coronary artery disease, TIA (2002, manifested as right sided numbness), insulin dependent diabetes mellitus type 2, hypertension, hyperlipidemia, GERD and BPH returning to the clinic for follow-up of diabetic neuropathy and essential tremor.  The patient was accompanied to the clinic by self.  History of present illness: He moved from [place] in February 2015 to be closer to his brother. He has been diabetic since 1985 and in late 1990s, he developed numbness > tingling of the feet which seems to have been stable since onset. He has paresthesias below the level of the ankles, worse on the right side, as well as interemittent numbness/tingling of the right hand. He ambulates independently. He saw Dr. Etter Sjogren in April who started neurontin '100mg'$  twice daily which seems to help slightly.  He has previously been a patient of Dr. Gery Pray in [place]. His last clinic note dated 08/14/2013, indicates that he was being seen for history of memory loss and headaches. Headaches improved significantly after he retired. He has short-term memory problems, which does not interefere with daily life. Mostly forgetting little details or tasks. He is driving and has not been involving in any accidents. He is able to do all his IADLs and ADLs. No problems with finances.  UPDATE 12/02/2014:  He was last seen in June 2015 and has new complains of voice changes.  He underwent right shoulder surgery in September 2015 and since then he has noticed a change in his voice, no slurring, but sounds more "airy". He complains about bilateral hand tremor and voice change which is worse when he is stressed. His handwriting has become worse and he has more difficulty with fine motor tasks.  Tremors are predominately worse when trying to do tasks,  but can be present when he is resting.  His two brothers have tremors.  UPDATE 04/23/2015:  He was having exertional angina and found to have 50% LAD stenosis for which he underwent DES placement on 4/1. His tremors of the hands and voice are most bothersome. 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  He had made himself a lovely breakfast and the tremors were so bad that his plate fell out of his hands and on the floor.   UPDATE 07/27/2015:  Since increasing his propranolol to '40mg'$  twice daily, tremors somewhat improved.  UPDATE 01/04/2016:  He unexpectedly came to the office requesting to be seen today even though his scheduled appointment with me is in 1 week.  His tremors have steadily worsened since his last visit.  Fine motor tasks such as measuring his blood glucose and using utensils is more and more challenging.  He was recently hospitalized for unstable angina and found to have left circumflex disease in addition to worsening of heart failure. He is being medically managed.    Medications:  Current Outpatient Prescriptions on File Prior to Visit  Medication Sig Dispense Refill  . aspirin EC 81 MG tablet Take 81 mg by mouth daily.    . B-D INS SYR ULTRAFINE 1CC/31G 31G X 5/16" 1 ML MISC inject three times a day AS INSTRUCTED  1  . BD INSULIN SYRINGE U-500 31G X 6MM 0.5 ML MISC   1  . Blood Glucose Monitoring Suppl (ONE TOUCH ULTRA SYSTEM KIT) W/DEVICE KIT  Use to test blood sugar daily as instructed. Dx code: E11.59 1 each 0  . Ciclopirox 1 % shampoo Apply 1 application topically at bedtime. At night after showering  0  . clonazePAM (KLONOPIN) 1 MG tablet Take 1 tablet (1 mg total) by mouth at bedtime as needed for anxiety. 30 tablet 2  . dextromethorphan-guaiFENesin (MUCINEX DM) 30-600 MG 12hr tablet Take 1 tablet by mouth 2 (two) times daily. 30 tablet 0  . diclofenac sodium (VOLTAREN) 1 % GEL Apply 2 g topically 4 (four) times daily.      Marland Kitchen dicyclomine (BENTYL) 10 MG capsule Take 1 capsule (10 mg total) by mouth 3 (three) times daily before meals. 90 capsule 5  . esomeprazole (NEXIUM) 40 MG capsule Take 1 capsule (40 mg total) by mouth daily at 12 noon.    . fenofibrate 160 MG tablet Take 1 tablet (160 mg total) by mouth daily.  0  . fluticasone furoate-vilanterol (BREO ELLIPTA) 100-25 MCG/INH AEPB Inhale 1 puff into the lungs daily. 60 each 5  . furosemide (LASIX) 40 MG tablet Take 1 tablet (40 mg total) by mouth daily. 90 tablet 2  . glucose blood test strip Use to test blood sugar 4 times daily instructed. Dx: E11.59 125 each 5  . insulin NPH-regular Human (NOVOLIN 70/30) (70-30) 100 UNIT/ML injection Inject 40-60 Units into the skin 2 (two) times daily with a meal. Patient stated that he only take 70/30 60 units QPM with supper Per home medication list: Prescribed 40 units in the morning, 60 units in the evening 10 mL   . insulin regular (NOVOLIN R,HUMULIN R) 100 units/mL injection Inject 0.4 mLs (40 Units total) into the skin 3 (three) times daily before meals. 10 mL   . Insulin Syringes, Disposable, U-100 1 ML MISC Use 4x a day 200 each 11  . isosorbide mononitrate (IMDUR) 60 MG 24 hr tablet Take 1 tablet (60 mg total) by mouth daily. 30 tablet 6  . metoprolol (LOPRESSOR) 50 MG tablet Take 1 tablet (50 mg total) by mouth 2 (two) times daily. 60 tablet 6  . nitroGLYCERIN (NITROSTAT) 0.4 MG SL tablet Place 1 tablet (0.4 mg total) under the tongue every 5 (five) minutes as needed for chest pain (CP or SOB). 25 tablet 4  . Polyvinyl Alcohol-Povidone (REFRESH OP) Place 1 drop into both eyes daily as needed (for dry eyes). Reported on 12/25/2015    . potassium chloride SA (K-DUR,KLOR-CON) 20 MEQ tablet Take 1 tablet (20 mEq total) by mouth daily. 30 tablet 11  . prasugrel (EFFIENT) 10 MG TABS tablet Take 1 tablet (10 mg total) by mouth daily. 56 tablet 0  . pravastatin (PRAVACHOL) 40 MG tablet Take 1 tablet (40 mg total) by mouth  daily. 30 tablet 5  . ranolazine (RANEXA) 1000 MG SR tablet Take 1 tablet (1,000 mg total) by mouth 2 (two) times daily.    . VENTOLIN HFA 108 (90 BASE) MCG/ACT inhaler Inhale 2 puffs into the lungs every 6 (six) hours as needed. (SHORTNESS OR BREATH / WHEEZING)  0  . amLODipine (NORVASC) 10 MG tablet take 1 tablet by mouth once daily 30 tablet 11  . pantoprazole (PROTONIX) 40 MG tablet take 1 tablet by mouth once daily AT 6 AM 30 tablet 11   No current facility-administered medications on file prior to visit.    Allergies: No Known Allergies  Review of Systems:  CONSTITUTIONAL: No fevers, chills, night sweats, or weight loss.  EYES: No visual changes or  eye pain ENT: No hearing changes.  No history of nose bleeds.   RESPIRATORY: No cough, wheezing and shortness of breath.   CARDIOVASCULAR: Negative for chest pain, and palpitations.   GI: Negative for abdominal discomfort, blood in stools or black stools.  No recent change in bowel habits.   GU:  No history of incontinence.   MUSCLOSKELETAL: +history of joint pain or swelling.  No myalgias.   SKIN: Negative for lesions, rash, and itching.   ENDOCRINE: Negative for cold or heat intolerance, polydipsia or goiter.   PSYCH:  no depression or anxiety symptoms.   NEURO: As Above.   Vital Signs:  BP 140/80 mmHg  Pulse 77  Ht '5\' 7"'$  (1.702 m)  Wt 280 lb 2 oz (127.064 kg)  BMI 43.86 kg/m2  SpO2 98%  Neurological Exam: MENTAL STATUS including orientation to time, place, person, recent and remote memory, attention span and concentration, language, and fund of knowledge is normal.  Tremulous speech pattern.  No dysarthria.   CRANIAL NERVES:   Normal conjugate, extra-ocular eye movements in all directions of gaze.  Mild bilateral ptosis (old).  Face is symmetric.    MOTOR:  Motor strength is 5/5 in all extremities.  Tone is normal.  No hand tremor with arms outstretching, but there is moderate amplitude tremor with fine motor tasks (he  demonstrated by showing how to use his glucometer).    COORDINATION/GAIT:    Gait narrow based and stable unassisted, turns with 3 steps.  Rises to stand without using arms.  There is no bradykinesia with finger or toe tapping.   Data: EMG 12/01/2014: 1. 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 Overall, these findings are moderately severe in degree electrically.  2. 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 There is no evidence of a cervical/lumbosacral radiculopathy affecting the right side  Lab Results  Component Value Date   HGBA1C 8.9 10/19/2015   Labs 04/08/2014:  Copper 64*, ceruloplasmin 18, vitamin B12 290, SPEP/UPEP with IFE no M protein  CT head 10/21/2014 and 01/15/2015:  Negative   IMPRESSION/PLAN: 1. Essential tremor involving the hands and voice, worsening.  Parkinson's disease is less likely especially with primary action tremors and lack of rigidity, gait instability, and resting tremors.   We can increase his propranolol to '60mg'$  twice daily but with his recent cardiac events, I hesitate to make any medication changes that could affect his heart rate. He has been instructed to track his HR and send me an update in 1 month.  Avoid using primidone as it has the risk of reducing the concentration of Ranexa.  2.  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   MG antibody negative  3.  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  EMG with moderately severe neuropathy affecting the right side.  Paresthesias not bothersome enough to start medications  4.  Right ulnar neuropathy at the elbow, stable  Return to clinic in 2 months  The duration of this appointment visit was 25 minutes of face-to-face time with the patient.  Greater than 50% of this time was spent  in counseling, explanation of diagnosis, planning of further management, and coordination of care.   Thank you for allowing me to participate in patient's care.  If I can answer any additional questions, I would be pleased to do so.    Sincerely,  Christinea Brizuela K. Axie Hayne, DO    

## 2016-01-05 ENCOUNTER — Telehealth: Payer: Self-pay | Admitting: *Deleted

## 2016-01-05 ENCOUNTER — Ambulatory Visit (HOSPITAL_COMMUNITY): Payer: Medicare Other

## 2016-01-05 NOTE — Telephone Encounter (Signed)
I called pt, per Nell Range, PA-C, and relayed message below.  Pt will call back once it has been scheduled. I spoke with Dr. Tamala Julian about his need for dental work (possible tooth extraction). It has been almost 1 year on DAPT with ASA/Effient after last DES placement in 01/2015. Dr Tamala Julian feels like he needs to be on Effient long term but that it would be okay to temporarily stop for dental extraction. I will have my nurse call the patient to let him know he is able to temporarily come of his DAPT for dental extraction. When he schedules the procedure, we will have him call us for further instructions on when to stop and resume.    Angelena Form PA-C [facility]

## 2016-01-06 ENCOUNTER — Telehealth: Payer: Self-pay | Admitting: *Deleted

## 2016-01-06 NOTE — Telephone Encounter (Signed)
Received Home Health Certification & Plan of Care; forwarded to provider/SLS 03/08  

## 2016-01-06 NOTE — Assessment & Plan Note (Signed)
Per cardiology 

## 2016-01-07 ENCOUNTER — Ambulatory Visit (HOSPITAL_COMMUNITY): Payer: Medicare Other

## 2016-01-11 ENCOUNTER — Ambulatory Visit: Payer: Medicare Other | Admitting: Neurology

## 2016-01-12 ENCOUNTER — Ambulatory Visit: Payer: Medicare Other | Admitting: Internal Medicine

## 2016-01-12 ENCOUNTER — Ambulatory Visit (HOSPITAL_COMMUNITY): Payer: Medicare Other

## 2016-01-14 ENCOUNTER — Ambulatory Visit (HOSPITAL_COMMUNITY): Payer: Medicare Other

## 2016-01-14 ENCOUNTER — Other Ambulatory Visit: Payer: Self-pay | Admitting: *Deleted

## 2016-01-14 MED ORDER — INSULIN REGULAR HUMAN (CONC) 500 UNIT/ML ~~LOC~~ SOPN: PEN_INJECTOR | SUBCUTANEOUS | Status: DC

## 2016-01-14 NOTE — Telephone Encounter (Signed)
Per Dr Cruzita Lederer.

## 2016-01-18 ENCOUNTER — Ambulatory Visit (INDEPENDENT_AMBULATORY_CARE_PROVIDER_SITE_OTHER): Payer: Medicare Other | Admitting: Internal Medicine

## 2016-01-18 ENCOUNTER — Encounter: Payer: Self-pay | Admitting: Internal Medicine

## 2016-01-18 VITALS — BP 140/76 | HR 60 | Temp 97.9°F | Ht 67.0 in | Wt 276.5 lb

## 2016-01-18 DIAGNOSIS — E1159 Type 2 diabetes mellitus with other circulatory complications: Secondary | ICD-10-CM

## 2016-01-18 DIAGNOSIS — I200 Unstable angina: Secondary | ICD-10-CM

## 2016-01-18 DIAGNOSIS — Z794 Long term (current) use of insulin: Secondary | ICD-10-CM | POA: Diagnosis not present

## 2016-01-18 LAB — POCT GLYCOSYLATED HEMOGLOBIN (HGB A1C): Hemoglobin A1C: 9

## 2016-01-18 LAB — GLUCOSE, POCT (MANUAL RESULT ENTRY): POC Glucose: 163 mg/dl — AB (ref 70–99)

## 2016-01-18 MED ORDER — INSULIN NPH ISOPHANE & REGULAR (70-30) 100 UNIT/ML ~~LOC~~ SUSP: SUBCUTANEOUS | Status: DC

## 2016-01-18 MED ORDER — INSULIN REGULAR HUMAN 100 UNIT/ML IJ SOLN: 20.0000 [IU] | Freq: Three times a day (TID) | INTRAMUSCULAR | Status: DC

## 2016-01-18 NOTE — Patient Instructions (Addendum)
Please change the insulin doses as below:  Insulin  Before breakfast  Before lunch  Before dinner  Bedtime  Novolin 70/30  40  60   Novolin 20 30 30     Please return in 2 months with your sugar log.

## 2016-01-18 NOTE — Progress Notes (Signed)
Patient ID: Edward [name], male   DOB: 03-11-1941, 75 y.o.   MRN: [id number]  HPI: Edward [name] is a 75 y.o.-year-old male, returning for f/u for DM2,  dx 1987, insulin-dependent since 1989, uncontrolled, with complications (CAD - s/p 2 AMIs, PN). [place] moved from [place] in 12/2013. Last visit 3 mo ago.  Pt was admitted with CP 12/18/2015 >> had another AMI. He was started on Ranexa and Effient, his dose of Imdur was increased. Plan is to continue medical tx.  He started a meal pgm >> lost weight.  Last hemoglobin A1c was lower after starting U500 insulin:  Lab Results  Component Value Date   HGBA1C 8.9 10/19/2015   HGBA1C 10.5 08/20/2015   HGBA1C 10.5 05/20/2015   He was on: Insulin  Before breakfast  Before lunch  Before dinner  Bedtime  Novolin 70/30  60 60 60 60 >> 70  Novolin 40 40 40   He was on Metformin >> N/V/D.  He was on: U500 insulin 30 min before a meal: - breakfast: 16-18 units  - lunch: 16-18 units  - dinner: 14-16 units  U500 sliding scale: 150-200: + 1 unit 201-250: + 2 units 251-300: + 3 units 301-350: + 4 units >350: + 5 units  He ran out of U500 insulin since last visit >> had to go back to Novolon 70/30 >> did not restart U500 yet as he cannot afford it. Now on: Insulin  Before breakfast  Before lunch  Before dinner  Bedtime  Novolin 70/30     60  Novolin 40 40 40     Pt checks his sugars 4-5 a day: - am: 128-344 >> 52, 60, 79-339 >> 90-120s >> 150-240 >> 125, 156-200, 265 >> 110, 120-130, 234 >> 100-140 - 2h after b'fast:200 >> 78 x1, 209-337 >> 128-263, 380 >> 170-180 >> 120-125 >> n/c - before lunch: 216, 294, 402 >> 179-370 >> 68, 178-299, 319  >> 130s >> 200 >> 125,135-169, 212 >> 170s >> 130-200 - 2h after lunch: n/c >> 222, 375, 545 >> 198-309 >> 121-254 >> 170-180s >> n/c >> n/c - before dinner: 280s >> 177 >> 67 x1, 175-312 >> 302, 395 >> 170s >> 200 >> 123, 134-212 >> 150-170 >> 150-200 - 2h after dinner: n/c >> 301-379>> 99, 128, 368 >> n/c >>  280 >> n/c - bedtime: 200-300 >> 190-463 >> 46, 284 >> <200 >> 190-200 >> 93-198 >> n/c >> 160s - nighttime: n/c >> 300s >> 139-339 >> n/c >> 52 x2 >> 83. Lowest sugar was 93 - he has hypoglycemia awareness at 90  >> 50-70s 4-5x (at night) >> 83.  Highest sugar was 398 >> 200s >> 300s 3x (takes 5 units) >> low 200s.  Has a One Touch Ultra meter.   Walking in the neighborhood >> will start cardiac rehab.  He will start Pulmonary rehab 2h 2x a week - for 8-10 weeks.  - mild CKD, last BUN/creatinine:  Lab Results  Component Value Date   BUN 18 12/31/2015   CREATININE 1.33* 12/31/2015  On Lisinopril. ACR 37.6 in 02/11/2014) - Has HL. Last set of lipids: Lab Results  Component Value Date   CHOL 172 12/18/2015   HDL 34* 12/18/2015   LDLCALC 115* 12/18/2015   LDLDIRECT 144.0 07/08/2015   TRIG 117 12/18/2015   CHOLHDL 5.1 12/18/2015  Not on a statin. He gets leg cramps from Lipitor >> cannot tolerate it. - last eye exam was in 04/2015 (  Dr Lanell Matar) 10/2014 (Dr Zigmund Daniel). Has DR. Had cataracts sx.  - + numbness and tingling in his feet. He saw podiatrist (05/2015) >> has PN. On ASA 81.  I reviewed pt's medications, allergies, PMH, social hx, family hx, and changes were documented in the history of present illness. Otherwise, unchanged from my initial visit note, except he started Effient.  ROS: Constitutional: + weight loss, + fatigue, no hot flushes Eyes: + blurry vision, no xerophthalmia ENT: no sore throat, no nodules palpated in throat, no dysphagia/odynophagia, no hoarseness Cardiovascular: less CP/+ SOB/no palpitations/+ leg swelling Respiratory: no cough/+ SOB/+ wheezing Gastrointestinal: + N/no V/D/C/heartburn Musculoskeletal: no muscle aches/+ joint aches  Skin: no rash Neurological: + tremors/no numbness/tingling/dizziness  PE: BP 140/76 mmHg  Pulse 60  Temp(Src) 97.9 F (36.6 C) (Oral)  Ht 5\' 7"  (1.702 m)  Wt 276 lb 8 oz (125.42 kg)  BMI 43.30 kg/m2 Body mass  index is 43.3 kg/(m^2). Wt Readings from Last 3 Encounters:  01/18/16 276 lb 8 oz (125.42 kg)  01/04/16 280 lb 2 oz (127.064 kg)  01/01/16 283 lb 9.6 oz (128.64 kg)   Constitutional: obese, in NAD Eyes: PERRLA, EOMI, no exophthalmos ENT: moist mucous membranes, no thyromegaly, no cervical lymphadenopathy Cardiovascular: RRR, No MRG, + B pitting leg swelling - decreased Respiratory: CTA B Gastrointestinal: abdomen soft, NT, ND, BS+ Musculoskeletal: no deformities, strength intact in all 4 Skin: moist, warm, no rashes Neurological: no tremor with outstretched hands, DTR normal in all 4  ASSESSMENT: 1. DM2, insulin-dependent, uncontrolled, with complications - CAD H/o steroid inj's  Cardiologist: Dr Tamala Julian. Pulmonologist: Dr Lake Bells  PLAN:  1. Patient with long-standing, uncontrolled diabetes, previously on U500 insulin, now back on Novolin 70/30 + Novolin due to finances. Sugars are better, but still variable as he takes only one dose of 70/30 at bedtime >> will move this at dinnertime, decrease the NovoLin doses and add another Novolin 70/30 dose in am. - I suggested to:  Patient Instructions   Please change the insulin doses as below:  Insulin  Before breakfast  Before lunch  Before dinner  Bedtime  Novolin 70/30  40  60   Novolin 20 30 30     Please return in 3 months with your sugar log.   - continue checking sugars at different times of the day - check 3-4 times a day, rotating checks - up to date with yearly eye exams  - checked HbA1c >> 9.0% (slightly higher) - Return to clinic in 2 mo with sugar log

## 2016-01-19 ENCOUNTER — Ambulatory Visit (HOSPITAL_COMMUNITY): Payer: Medicare Other

## 2016-01-21 ENCOUNTER — Ambulatory Visit (HOSPITAL_COMMUNITY): Payer: Medicare Other

## 2016-01-26 ENCOUNTER — Ambulatory Visit (HOSPITAL_COMMUNITY): Payer: Medicare Other

## 2016-01-27 NOTE — Progress Notes (Signed)
Cardiology Office Note   Date:  01/28/2016   ID:  Edward [name], DOB Jan 28, 1941, MRN [id number]  PCP:  Ann Held, DO  Cardiologist:  Dr. Tamala Julian    Follow-up    History of Present Illness: Edward [name] is a 75 y.o. male with a history of CAD s/p PCI/DES to mid LAD in 01/2015, HTN, HLD, OSA and poorly controlled DMT2 who presents to clinic for follow-up.  He has a history of complex CAD. Cath 01/30/2015 with 85% mid LAD lesion treated with DES, 30-50% distal RCA lesion, EF 55%. He had shortness of breath with Brilinta however this persisted despite switching him to Effient. Was seen by Dr. Lake Bells in 11/2015 and he felt shortness of breath was related to obesity and deconditioning. He had a stress test in 06/2015 with apical scar, no current ischemia: low risk test. He had been working with pulmonary rehabilitation and trying and increase activity and loose weight.   He was admitted to [facility] from 2/17-2/23/17 for dyspnea and chest pain worrisome for [place]. Upon presentation he had reported gaining 25 pounds over the past 6 months. it was decided to proceed with left and right heart cath: Right heart cath given his ongoing dyspnea and inability to measure pulmonary pressure by echo. He underwent L&RHC 12/21/15 which revealed 40-50% L main with cross sectional area by IVUS 5.96 mm at most severe (borderline), 85% ostial LCx (culprit lesion), widely patent mLAD stent, mod non critical disease in RCA, and acute on chronic D/CHF with markedly elevated filling pressures (LVEDP 30/PCWP 24) as well as mod pulm HTN. Plan was to consult CTCS for CABG vs stenting from the circumflex back into the left main, although this would require meticulous technique and could put the LAD at risk. CTCS saw patient and felt that CABG would probably result in pain relief but no survival benefit. Dr. Roxan Hockey discussed with Dr Tamala Julian who felt it was most appropriate to attempt to control sx with  medical therapy. If he fails optimal medical therapy then either CABG or PCI would be an acceptable option for control of angina. He was started on Imdur which was titrated up to 60 mg. He continued to have chest pain and was started on Ranexa 1055m BID. Additionally, he was started on IV Lasix due to elevated filling pressures on right heart cath and diuresed 6.6 L. His discharge weight was 277 pounds. His creatinine did bump a little after heart cath and IV diuresis.  I saw him in clinic for post hospital follow-up on 12/31/15. He had been doing much better since discharge. Chest pain was much better on imdur 666m He had not picked up the Ranexa yet. I arranged for Ranexa samples.  Today he presents to clinic for follow up. He is feeling the best he has felt the years. He feels like the Ranexa is a "miracle" pill. He has had absolutely not chest pain or SOB. No LE edema, orthopnea or PND. No dizziness or syncope.    Past Medical History  Diagnosis Date  . Hypertension   . CAD (coronary artery disease)     a. 01/2015 DES to LAD  b. 12/2015: [place]% oLCx lesion--> rx therapy.  . Marland KitchenIA (transient ischemic attack) 2002  . Dyslipidemia, goal LDL below 70 01/29/2014  . GERD (gastroesophageal reflux disease)   . Enlarged prostate   . DM type 2 (diabetes mellitus, type 2), insulin dependent 01/29/2014    fasting cbg  50-120 with new regimen  . Sleep apnea     does not use cpap (06/04/2015)  . Essential tremor     a. on proprnolol  . Adenomatous colon polyp 04/2011  . Internal hemorrhoid   . Chronic diastolic CHF (congestive heart failure) [facility])     Past Surgical History  Procedure Laterality Date  . Total knee arthroplasty Bilateral   . Lumbar disc surgery    . Cataract extraction Bilateral   . Joint replacement    . Back surgery    . Cataract extraction, bilateral Bilateral   . Shoulder arthroscopy Right 07/30/2014    Procedure: Right Shoulder Arthroscopy, Debridement, Decompression, Manipulation  Under Anesthesia;  Surgeon: Newt Minion, MD;  Location: [place];  Service: Orthopedics;  Laterality: Right;  . Left heart catheterization with coronary angiogram N/A 01/28/2014    Procedure: LEFT HEART CATHETERIZATION WITH CORONARY ANGIOGRAM;  Surgeon: Sinclair Grooms, MD;  Location: [facility] CATH LAB;  Service: Cardiovascular;  Laterality: N/A;  . Left heart catheterization with coronary angiogram N/A 01/30/2015    Procedure: LEFT HEART CATHETERIZATION WITH CORONARY ANGIOGRAM;  Surgeon: Belva Crome, MD;  Location: [facility] CATH LAB;  Service: Cardiovascular;  Laterality: N/A;  . Cardiac catheterization  01/28/14    + CAD treat medically  . Coronary angioplasty with stent placement  01/30/2015    DES Promus  Premier to LAD by Dr Tamala Julian  . Cardiac catheterization N/A 12/21/2015    Procedure: Right/Left Heart Cath and Coronary Angiography;  Surgeon: Belva Crome, MD;  Location: [place] CV LAB;  Service: Cardiovascular;  Laterality: N/A;     Current Outpatient Prescriptions  Medication Sig Dispense Refill  . amLODipine (NORVASC) 10 MG tablet take 1 tablet by mouth once daily 30 tablet 11  . aspirin EC 81 MG tablet Take 81 mg by mouth daily.    . B-D INS SYR ULTRAFINE 1CC/31G 31G X 5/16" 1 ML MISC inject three times a day AS INSTRUCTED  1  . BD INSULIN SYRINGE U-500 31G X 6MM 0.5 ML MISC   1  . Blood Glucose Monitoring Suppl (ONE TOUCH ULTRA SYSTEM KIT) W/DEVICE KIT Use to test blood sugar daily as instructed. Dx code: E11.59 1 each 0  . Ciclopirox 1 % shampoo Apply 1 application topically at bedtime. At night after showering  0  . clonazePAM (KLONOPIN) 1 MG tablet Take 1 tablet (1 mg total) by mouth at bedtime as needed for anxiety. 30 tablet 2  . dextromethorphan-guaiFENesin (MUCINEX DM) 30-600 MG 12hr tablet Take 1 tablet by mouth 2 (two) times daily. 30 tablet 0  . diclofenac sodium (VOLTAREN) 1 % GEL Apply 2 g topically 4 (four) times daily.    Marland Kitchen dicyclomine (BENTYL) 10 MG capsule Take 1 capsule (10  mg total) by mouth 3 (three) times daily before meals. 90 capsule 5  . esomeprazole (NEXIUM) 40 MG capsule Take 1 capsule (40 mg total) by mouth daily at 12 noon.    . fenofibrate 160 MG tablet Take 1 tablet (160 mg total) by mouth daily.  0  . fluticasone furoate-vilanterol (BREO ELLIPTA) 100-25 MCG/INH AEPB Inhale 1 puff into the lungs daily. 60 each 5  . furosemide (LASIX) 40 MG tablet Take 1 tablet (40 mg total) by mouth daily. 90 tablet 2  . glucose blood test strip Use to test blood sugar 4 times daily instructed. Dx: E11.59 125 each 5  . HUMULIN R U-500 KWIKPEN 500 UNIT/ML kwikpen   0  .  insulin NPH-regular Human (NOVOLIN 70/30) (70-30) 100 UNIT/ML injection Inject under skin 40 units in the morning and 60 units before dinner 30 mL 3  . insulin regular (NOVOLIN R) 100 units/mL injection Inject 0.2-0.3 mLs (20-30 Units total) into the skin 3 (three) times daily before meals. 30 mL 3  . Insulin Syringes, Disposable, U-100 1 ML MISC Use 4x a day 200 each 11  . isosorbide mononitrate (IMDUR) 60 MG 24 hr tablet Take 1 tablet (60 mg total) by mouth daily. 30 tablet 6  . metoprolol (LOPRESSOR) 50 MG tablet Take 1 tablet (50 mg total) by mouth 2 (two) times daily. 60 tablet 6  . nitroGLYCERIN (NITROSTAT) 0.4 MG SL tablet Place 1 tablet (0.4 mg total) under the tongue every 5 (five) minutes as needed for chest pain (CP or SOB). 25 tablet 4  . oxyCODONE-acetaminophen (PERCOCET/ROXICET) 5-325 MG tablet Take 1 tablet by mouth every 6 (six) hours as needed.  0  . pantoprazole (PROTONIX) 40 MG tablet Take 40 mg by mouth daily.  1  . Polyvinyl Alcohol-Povidone (REFRESH OP) Place 1 drop into both eyes daily as needed (for dry eyes). Reported on 12/25/2015    . potassium chloride SA (K-DUR,KLOR-CON) 20 MEQ tablet Take 1 tablet (20 mEq total) by mouth daily. 30 tablet 11  . prasugrel (EFFIENT) 10 MG TABS tablet Take 1 tablet (10 mg total) by mouth daily. 56 tablet 0  . pravastatin (PRAVACHOL) 40 MG tablet Take  1 tablet (40 mg total) by mouth daily. 30 tablet 5  . propranolol (INDERAL) 40 MG tablet Take 1 tablet (40 mg total) by mouth 2 (two) times daily. 180 tablet 3  . ranolazine (RANEXA) 1000 MG SR tablet Take 1 tablet (1,000 mg total) by mouth 2 (two) times daily.    . VENTOLIN HFA 108 (90 BASE) MCG/ACT inhaler Inhale 2 puffs into the lungs every 6 (six) hours as needed. (SHORTNESS OR BREATH / WHEEZING)  0   No current facility-administered medications for this visit.    Allergies:   Review of patient's allergies indicates no known allergies.    Social History:  The patient  reports that he has never smoked. He has never used smokeless tobacco. He reports that he does not drink alcohol or use illicit drugs.   Family History:  The patient's family history includes Alzheimer's disease in his sister; Asthma in his brother; CAD in his brother and sister; Diabetes in his other; Prostate cancer in his brother.    ROS:  Please see the history of present illness.   Otherwise, review of systems are positive for none.   All other systems are reviewed and negative.    PHYSICAL EXAM: VS:  BP 130/80 mmHg  Pulse 88  Ht '5\' 7"'  (1.702 m)  Wt 283 lb 12.8 oz (128.731 kg)  BMI 44.44 kg/m2  SpO2 95% , BMI Body mass index is 44.44 kg/(m^2). GEN: Well nourished, well developed, in no acute distressobese HEENT: normal Neck: no JVD, carotid bruits, or masses Cardiac:RRR; no murmurs, rubs, or gallops,no edema  Respiratory:  clear to auscultation bilaterally, normal work of breathing GI: soft, nontender, nondistended, + BS MS: no deformity or atrophy Skin: warm and dry, no rash Neuro:  Strength and sensation are intact Psych: euthymic mood, full affect   EKG:  EKG is not ordered today.    Recent Labs: 07/24/2015: Pro B Natriuretic peptide (BNP) 24.0 12/18/2015: B Natriuretic Peptide 43.8 12/19/2015: ALT 18 12/21/2015: Hemoglobin 11.9*; Platelets 190 12/31/2015: BUN 18;  Creat 1.33*; Potassium 4.0; Sodium  141    Lipid Panel    Component Value Date/Time   CHOL 172 12/18/2015 1830   TRIG 117 12/18/2015 1830   HDL 34* 12/18/2015 1830   CHOLHDL 5.1 12/18/2015 1830   VLDL 23 12/18/2015 1830   LDLCALC 115* 12/18/2015 1830   LDLDIRECT 144.0 07/08/2015 1344      Wt Readings from Last 3 Encounters:  01/28/16 283 lb 12.8 oz (128.731 kg)  01/18/16 276 lb 8 oz (125.42 kg)  01/04/16 280 lb 2 oz (127.064 kg)      Other studies Reviewed: Additional studies/ records that were reviewed today include: L/RHC, 2D ECHO. Review of the above records demonstrates:   12/21/15 Procedures    Right/Left Heart Cath and Coronary Angiography    Conclusion    1. Dist RCA lesion, 40% stenosed. 2. Prox RCA to Mid RCA lesion, 25% stenosed. 3. Ost Cx lesion, 85% stenosed. 4. Mid LAD lesion, 40% stenosed. 5. Mid LAD to Dist LAD lesion, 5% stenosed. The lesion was previously treated with a stent (unknown type). 6. Ost 1st Diag lesion, 75% stenosed. 7. LM lesion, 50% stenosed. 8. The left ventricular systolic function is normal.   40-50% distal left main with cross-sectional area by intravascular ultrasound at most severe narrowing 5.96 mm (borderline).  85% ostial circumflex, driving the patient's presentation with class III/IV angina pectoris.  Widely patent mid LAD stent placed 10 months ago .  Moderate, noncritical disease in the native right coronary.  Acute on chronic diastolic heart failure with marked elevation in filling pressure (LVEDP 30 mmHg and mean PCWP 24 mmHg).  Moderate pulmonary hypertension.  Recommendations:   Heart team approach with TCTS consult for consideration of coronary grafting.  If felt to be a high surgical risk, could consider stenting from the circumflex back into the left main, although this will require meticulous technique and could put the LAD at risk.  Most prudent approach at this time is probably intensified medical therapy and PCI or CABG only  if meds fail.  More aggressive diuresis and add LA nitrate.      2D ECHO: 12/18/2015 LV EF: 60% -  65% Study Conclusions - Left ventricle: The cavity size was normal. There was moderate concentric hypertrophy. Systolic function was normal. The estimated ejection fraction was in the range of 60% to 65%. Wall motion was normal; there were no regional wall motion abnormalities. There was an increased relative contribution of atrial contraction to ventricular filling. Doppler parameters are consistent with abnormal left ventricular relaxation (grade 1 diastolic dysfunction). - Mitral valve: There was trivial regurgitation.        ASSESSMENT AND PLAN:  Edward [name] is a 75 y.o. male with a history of CAD s/p PCI/DES to mid LAD in 01/2015, HTN, HLD, OSA and poorly controlled DMT2 who presents to clinic for follow-up.  CAD: underwent L&RHC 12/21/15 which revealed 40-50% L main with cross sectional area by IVUS 5.96 mm at most severe (borderline), 85% ostial LCx (culprit lesion), widely patent mLAD stent, mod non critical disease in RCA, and acute on chronic D/CHF with markedly elevated filling pressures (LVEDP 30/PCWP 24) as well as mod pulm HTN. Ultimately, it was decided to continue medical therapy for chest pain. Continue Imdur 60 mg and Ranexa 1000 mg twice a day. If he fails medical therapy, we will have to discuss high-risk PCI or CABG. -- Ranexa is a "miracle pill" for him. He has not had one episode of chest  pain. We are working to get patient assistance bc he cannot afford >200$ a month for Ranexa on a fixed income. He has samples of 1000 BID. -- Continue aspirin, Effient, statin, beta blocker.   Acute on chronic diastolic CHF: continue on 40 mg Lasix daily. He appears euvolemic.   HTN: BP 130/80. Continue on current regimen of amlodipine 32m daily, Lopressor 580mBID, imdur 6035maily, and propranolol 67m58mD.   HLD: Lipid panel 12/18/2015 Chol 172, trig 117,  HDL 34, LDL 115. Continue statin  DM II: poorly controlled. Follow with PCP.   Essential tremor: continue propranolol  Dental work for tooth abscess : i have given him the okay to use ABx and discussed with Dr. SmitTamala Julianut coming off Effient temporarily if he requires his tooth to be pulled. He will let us kKoreaw when he schedules this for further instructions.   Pulmonary Rehab: he is wanting to restart this but they are reluctant to take him back until we get his chest pain under control. I have sent a letter to pulmonary rehab for readmission.  Current medicines are reviewed at length with the patient today.  The patient does not have concerns regarding medicines.  The following changes have been made:  no change  Labs/ tests ordered today include:   No orders of the defined types were placed in this encounter.     Disposition:   FU with Dr SmitTamala Julian3 months  Signed, THOMCrista Luria30/2017 8:58 AM    [facility]  [id number]: [phone]: [phone]

## 2016-01-28 ENCOUNTER — Ambulatory Visit (INDEPENDENT_AMBULATORY_CARE_PROVIDER_SITE_OTHER): Payer: Medicare Other | Admitting: Physician Assistant

## 2016-01-28 ENCOUNTER — Encounter: Payer: Self-pay | Admitting: Physician Assistant

## 2016-01-28 ENCOUNTER — Ambulatory Visit (HOSPITAL_COMMUNITY): Payer: Medicare Other

## 2016-01-28 VITALS — BP 130/80 | HR 88 | Ht 67.0 in | Wt 283.8 lb

## 2016-01-28 DIAGNOSIS — E785 Hyperlipidemia, unspecified: Secondary | ICD-10-CM

## 2016-01-28 DIAGNOSIS — I2510 Atherosclerotic heart disease of native coronary artery without angina pectoris: Secondary | ICD-10-CM

## 2016-01-28 DIAGNOSIS — R0600 Dyspnea, unspecified: Secondary | ICD-10-CM | POA: Diagnosis not present

## 2016-01-28 DIAGNOSIS — Z9861 Coronary angioplasty status: Secondary | ICD-10-CM

## 2016-01-28 DIAGNOSIS — I200 Unstable angina: Secondary | ICD-10-CM

## 2016-01-28 DIAGNOSIS — I10 Essential (primary) hypertension: Secondary | ICD-10-CM | POA: Diagnosis not present

## 2016-01-28 DIAGNOSIS — I25118 Atherosclerotic heart disease of native coronary artery with other forms of angina pectoris: Secondary | ICD-10-CM

## 2016-01-28 DIAGNOSIS — I209 Angina pectoris, unspecified: Secondary | ICD-10-CM

## 2016-01-28 NOTE — Patient Instructions (Signed)
Medication Instructions:  Your physician recommends that you continue on your current medications as directed. Please refer to the Current Medication list given to you today.   Labwork: None ordered  Testing/Procedures: None ordered  Follow-Up: Your physician recommends that you schedule a follow-up appointment in: 3 MONTHS WITH DR. SMITH    Any Other Special Instructions Will Be Listed Below (If Applicable).     If you need a refill on your cardiac medications before your next appointment, please call your pharmacy.   

## 2016-02-02 ENCOUNTER — Ambulatory Visit (HOSPITAL_COMMUNITY): Payer: Medicare Other

## 2016-02-04 ENCOUNTER — Ambulatory Visit (HOSPITAL_COMMUNITY): Payer: Medicare Other

## 2016-02-08 ENCOUNTER — Other Ambulatory Visit: Payer: Self-pay | Admitting: *Deleted

## 2016-02-08 DIAGNOSIS — Z9861 Coronary angioplasty status: Secondary | ICD-10-CM

## 2016-02-08 DIAGNOSIS — E785 Hyperlipidemia, unspecified: Secondary | ICD-10-CM

## 2016-02-08 DIAGNOSIS — I2510 Atherosclerotic heart disease of native coronary artery without angina pectoris: Secondary | ICD-10-CM

## 2016-02-08 MED ORDER — NITROGLYCERIN 0.4 MG SL SUBL: 0.4000 mg | SUBLINGUAL_TABLET | SUBLINGUAL | Status: DC | PRN

## 2016-02-09 ENCOUNTER — Ambulatory Visit (HOSPITAL_COMMUNITY): Payer: Medicare Other

## 2016-02-09 DIAGNOSIS — R079 Chest pain, unspecified: Secondary | ICD-10-CM | POA: Diagnosis not present

## 2016-02-11 ENCOUNTER — Ambulatory Visit (HOSPITAL_COMMUNITY): Payer: Medicare Other

## 2016-02-12 ENCOUNTER — Telehealth (HOSPITAL_COMMUNITY): Payer: Self-pay

## 2016-02-12 NOTE — Telephone Encounter (Signed)
Called patient to discuss coming back to Pulmonary Rehab. Patient is interested in coming back at the end of June.  He states that he has been walking and he is feeling great. Will get order from Dr. Tamala Julian and contact patient mid June to schedule orientation.

## 2016-02-13 ENCOUNTER — Other Ambulatory Visit: Payer: Self-pay | Admitting: Interventional Cardiology

## 2016-02-13 DIAGNOSIS — I272 Pulmonary hypertension, unspecified: Secondary | ICD-10-CM

## 2016-02-13 DIAGNOSIS — G4733 Obstructive sleep apnea (adult) (pediatric): Secondary | ICD-10-CM

## 2016-02-13 DIAGNOSIS — R0609 Other forms of dyspnea: Principal | ICD-10-CM

## 2016-02-16 ENCOUNTER — Other Ambulatory Visit: Payer: Self-pay

## 2016-02-16 ENCOUNTER — Ambulatory Visit (HOSPITAL_COMMUNITY): Payer: Medicare Other

## 2016-02-16 DIAGNOSIS — R0600 Dyspnea, unspecified: Secondary | ICD-10-CM

## 2016-02-16 DIAGNOSIS — G4733 Obstructive sleep apnea (adult) (pediatric): Secondary | ICD-10-CM

## 2016-02-18 ENCOUNTER — Ambulatory Visit (HOSPITAL_COMMUNITY): Payer: Medicare Other

## 2016-02-22 ENCOUNTER — Other Ambulatory Visit: Payer: Self-pay | Admitting: Internal Medicine

## 2016-02-22 DIAGNOSIS — Z794 Long term (current) use of insulin: Principal | ICD-10-CM

## 2016-02-22 DIAGNOSIS — E1159 Type 2 diabetes mellitus with other circulatory complications: Secondary | ICD-10-CM

## 2016-02-23 ENCOUNTER — Ambulatory Visit (HOSPITAL_COMMUNITY): Payer: Medicare Other

## 2016-02-25 ENCOUNTER — Ambulatory Visit (HOSPITAL_COMMUNITY): Payer: Medicare Other

## 2016-02-26 ENCOUNTER — Telehealth: Payer: Self-pay | Admitting: Internal Medicine

## 2016-02-26 MED ORDER — INSULIN REGULAR HUMAN 100 UNIT/ML IJ SOLN: 20.0000 [IU] | Freq: Three times a day (TID) | INTRAMUSCULAR | Status: DC

## 2016-02-26 NOTE — Telephone Encounter (Signed)
Pharmacy called and said PT needs his Novolin R to [facility] on [address]

## 2016-02-29 ENCOUNTER — Encounter: Payer: Self-pay | Admitting: Family Medicine

## 2016-02-29 ENCOUNTER — Other Ambulatory Visit: Payer: Self-pay | Admitting: Family Medicine

## 2016-02-29 ENCOUNTER — Ambulatory Visit (INDEPENDENT_AMBULATORY_CARE_PROVIDER_SITE_OTHER): Payer: Medicare Other | Admitting: Ophthalmology

## 2016-02-29 DIAGNOSIS — E11311 Type 2 diabetes mellitus with unspecified diabetic retinopathy with macular edema: Secondary | ICD-10-CM | POA: Diagnosis not present

## 2016-02-29 DIAGNOSIS — H35033 Hypertensive retinopathy, bilateral: Secondary | ICD-10-CM

## 2016-02-29 DIAGNOSIS — E113412 Type 2 diabetes mellitus with severe nonproliferative diabetic retinopathy with macular edema, left eye: Secondary | ICD-10-CM

## 2016-02-29 DIAGNOSIS — E113511 Type 2 diabetes mellitus with proliferative diabetic retinopathy with macular edema, right eye: Secondary | ICD-10-CM

## 2016-02-29 DIAGNOSIS — H43813 Vitreous degeneration, bilateral: Secondary | ICD-10-CM

## 2016-02-29 DIAGNOSIS — I10 Essential (primary) hypertension: Secondary | ICD-10-CM | POA: Diagnosis not present

## 2016-02-29 LAB — HM DIABETES EYE EXAM

## 2016-03-01 ENCOUNTER — Telehealth: Payer: Self-pay | Admitting: Family Medicine

## 2016-03-01 ENCOUNTER — Ambulatory Visit (HOSPITAL_COMMUNITY): Payer: Medicare Other

## 2016-03-01 ENCOUNTER — Other Ambulatory Visit: Payer: Self-pay | Admitting: Family Medicine

## 2016-03-01 DIAGNOSIS — F419 Anxiety disorder, unspecified: Secondary | ICD-10-CM

## 2016-03-01 MED ORDER — CLONAZEPAM 1 MG PO TABS: 1.0000 mg | ORAL_TABLET | Freq: Every evening | ORAL | Status: DC | PRN

## 2016-03-01 NOTE — Telephone Encounter (Signed)
Caller name: Jacquelynn Cree with [facility] on [address] Can be reached: [phone] Conf fax # is: [phone]  Reason for call: They do not have RX on file for clonazepam from 01/01/16. Last RX on file was 08/31/15 and last filled 11/17/2015. Pt is leaving town tomorrow morning and would like new RX sent in today so he is not without it. I verified above fax#.

## 2016-03-01 NOTE — Telephone Encounter (Signed)
Informed patient to call pharmacy. Patient stated that he will contact them again.

## 2016-03-01 NOTE — Telephone Encounter (Signed)
Caller name:Self  Can be reached: [phone]  Pharmacy:  [id number], [place] [phone] (Phone) [phone] (Fax)       Reason for call: Refill clonazePAM (KLONOPIN) 1 MG tablet [id number]

## 2016-03-01 NOTE — Telephone Encounter (Signed)
He needs to call the pharmacy, the prescription in March has 2 refills on it.    KP

## 2016-03-01 NOTE — Telephone Encounter (Signed)
Last OV: 01/01/16 Last filled: 01/01/16, #30, 2 RF Sig: Take 1 tablet (1 mg total) by mouth at bedtime as needed for anxiety. UDS: 07/17/14

## 2016-03-01 NOTE — Telephone Encounter (Signed)
Rx phoned in with the same directions and re-faxed.    KP

## 2016-03-03 ENCOUNTER — Ambulatory Visit (HOSPITAL_COMMUNITY): Payer: Medicare Other

## 2016-03-07 ENCOUNTER — Encounter: Payer: Self-pay | Admitting: *Deleted

## 2016-03-08 ENCOUNTER — Ambulatory Visit (HOSPITAL_COMMUNITY): Payer: Medicare Other

## 2016-03-09 DIAGNOSIS — M5441 Lumbago with sciatica, right side: Secondary | ICD-10-CM | POA: Diagnosis not present

## 2016-03-10 ENCOUNTER — Ambulatory Visit (HOSPITAL_COMMUNITY): Payer: Medicare Other

## 2016-03-11 ENCOUNTER — Ambulatory Visit (INDEPENDENT_AMBULATORY_CARE_PROVIDER_SITE_OTHER): Payer: Medicare Other | Admitting: Neurology

## 2016-03-11 ENCOUNTER — Ambulatory Visit (INDEPENDENT_AMBULATORY_CARE_PROVIDER_SITE_OTHER): Payer: Medicare Other | Admitting: Internal Medicine

## 2016-03-11 ENCOUNTER — Encounter: Payer: Self-pay | Admitting: Internal Medicine

## 2016-03-11 ENCOUNTER — Encounter: Payer: Self-pay | Admitting: Neurology

## 2016-03-11 VITALS — BP 140/70 | HR 66 | Ht 67.0 in | Wt 274.2 lb

## 2016-03-11 VITALS — BP 122/80 | HR 66 | Temp 97.8°F | Resp 12 | Wt 274.0 lb

## 2016-03-11 DIAGNOSIS — G250 Essential tremor: Secondary | ICD-10-CM

## 2016-03-11 DIAGNOSIS — Z794 Long term (current) use of insulin: Secondary | ICD-10-CM | POA: Diagnosis not present

## 2016-03-11 DIAGNOSIS — I200 Unstable angina: Secondary | ICD-10-CM

## 2016-03-11 DIAGNOSIS — E1159 Type 2 diabetes mellitus with other circulatory complications: Secondary | ICD-10-CM

## 2016-03-11 DIAGNOSIS — E119 Type 2 diabetes mellitus without complications: Secondary | ICD-10-CM | POA: Insufficient documentation

## 2016-03-11 NOTE — Patient Instructions (Signed)
Please continue:  Insulin  Before breakfast  Before lunch  Before dinner   Novolin 70/30  40  60  Novolin 20 30 30    Please come back in 3 months.

## 2016-03-11 NOTE — Progress Notes (Signed)
Follow-up Visit   Date: 03/11/2016    Edward [name] MRN: [id number] DOB: 09/28/1941   Interim History: Edward [name] is a 75 y.o. right-handed African American male with coronary artery disease, TIA (2002, manifested as right sided numbness), insulin dependent diabetes mellitus type 2, hypertension, hyperlipidemia, GERD and BPH returning to the clinic for follow-up of diabetic neuropathy and essential tremor.  The patient was accompanied to the clinic by self.  History of present illness: He moved from [place] in February 2015 to be closer to his brother. He has been diabetic since 1985 and in late 1990s, he developed numbness > tingling of the feet which seems to have been stable since onset. He has paresthesias below the level of the ankles, worse on the right side, as well as interemittent numbness/tingling of the right hand. He ambulates independently. He was started neurontin 153m twice daily   He has previously been a [place]. NGery [place] His last clinic note dated 08/14/2013, indicates that he was being seen for history of memory loss and headaches. Headaches improved significantly after he retired. He has short-term memory problems, which does not interefere with daily life. Mostly forgetting little details or tasks. He is driving and has not been involving in any accidents. He is able to do all his IADLs and ADLs. No problems with finances.  UPDATE 12/02/2014:  He was last seen in June 2015 and has new complains of voice changes. He complains about bilateral hand tremor and voice change which is worse when he is stressed. His handwriting has become worse and he has more difficulty with fine motor tasks.  Tremors are predominately worse when trying to do tasks, but can be present when he is resting.  His two brothers have tremors.  UPDATE 07/27/2015:  Since increasing his propranolol to 477mtwice daily, tremors somewhat improved.  UPDATE 01/04/2016:  He  unexpectedly came to the office requesting to be seen today even though his scheduled appointment with me is in 1 week.  His tremors have steadily worsened since his last visit.  Fine motor tasks such as measuring his blood glucose and using utensils is more and more challenging.  He was recently hospitalized for unstable angina and found to have left circumflex disease in addition to worsening of heart failure. He is being medically managed.   UPDATE 03/11/2016:  He reports feeling so much better since having his cardiac medications changed.  His tremors are also doing much better, and only get worse when anxious or stressed. No new complaints.  He is feeling 98% well!  Medications:  Current Outpatient Prescriptions on File Prior to Visit  Medication Sig Dispense Refill  . amLODipine (NORVASC) 10 MG tablet take 1 tablet by mouth once daily 30 tablet 11  . aspirin EC 81 MG tablet Take 81 mg by mouth daily.    . B-D INS SYR ULTRAFINE 1CC/31G 31G X 5/16" 1 ML MISC inject three times a day AS INSTRUCTED  1  . BD INSULIN SYRINGE U-500 31G X 6MM 0.5 ML MISC   1  . Blood Glucose Monitoring Suppl (ONE TOUCH ULTRA SYSTEM KIT) W/DEVICE KIT Use to test blood sugar daily as instructed. Dx code: E11.59 1 each 0  . Ciclopirox 1 % shampoo Apply 1 application topically at bedtime. At night after showering  0  . clonazePAM (KLONOPIN) 1 MG tablet Take 1 tablet (1 mg total) by mouth at bedtime as needed for anxiety. 30 tablet  2  . dextromethorphan-guaiFENesin (MUCINEX DM) 30-600 MG 12hr tablet Take 1 tablet by mouth 2 (two) times daily. 30 tablet 0  . diclofenac sodium (VOLTAREN) 1 % GEL Apply 2 g topically 4 (four) times daily.    Marland Kitchen dicyclomine (BENTYL) 10 MG capsule Take 1 capsule (10 mg total) by mouth 3 (three) times daily before meals. 90 capsule 5  . esomeprazole (NEXIUM) 40 MG capsule Take 1 capsule (40 mg total) by mouth daily at 12 noon.    . fenofibrate 160 MG tablet Take 1 tablet (160 mg total) by mouth  daily.  0  . fluticasone furoate-vilanterol (BREO ELLIPTA) 100-25 MCG/INH AEPB Inhale 1 puff into the lungs daily. 60 each 5  . furosemide (LASIX) 40 MG tablet Take 1 tablet (40 mg total) by mouth daily. 90 tablet 2  . glucose blood test strip Use to test blood sugar 4 times daily instructed. Dx: E11.59 125 each 5  . HUMULIN R U-500 KWIKPEN 500 UNIT/ML kwikpen   0  . insulin NPH-regular Human (NOVOLIN 70/30) (70-30) 100 UNIT/ML injection Inject under skin 40 units in the morning and 60 units before dinner 30 mL 3  . insulin regular (NOVOLIN R) 100 units/mL injection Inject 0.2-0.3 mLs (20-30 Units total) into the skin 3 (three) times daily before meals. 30 mL 3  . Insulin Syringes, Disposable, U-100 1 ML MISC Use 4x a day 200 each 11  . isosorbide mononitrate (IMDUR) 60 MG 24 hr tablet Take 1 tablet (60 mg total) by mouth daily. 30 tablet 6  . metoprolol (LOPRESSOR) 50 MG tablet Take 1 tablet (50 mg total) by mouth 2 (two) times daily. 60 tablet 6  . nitroGLYCERIN (NITROSTAT) 0.4 MG SL tablet Place 1 tablet (0.4 mg total) under the tongue every 5 (five) minutes as needed for chest pain (CP or SOB). 25 tablet 4  . oxyCODONE-acetaminophen (PERCOCET/ROXICET) 5-325 MG tablet Take 1 tablet by mouth every 6 (six) hours as needed.  0  . pantoprazole (PROTONIX) 40 MG tablet Take 40 mg by mouth daily.  1  . Polyvinyl Alcohol-Povidone (REFRESH OP) Place 1 drop into both eyes daily as needed (for dry eyes). Reported on 12/25/2015    . potassium chloride SA (K-DUR,KLOR-CON) 20 MEQ tablet Take 1 tablet (20 mEq total) by mouth daily. 30 tablet 11  . prasugrel (EFFIENT) 10 MG TABS tablet Take 1 tablet (10 mg total) by mouth daily. 56 tablet 0  . pravastatin (PRAVACHOL) 40 MG tablet Take 1 tablet (40 mg total) by mouth daily. 30 tablet 5  . propranolol (INDERAL) 40 MG tablet Take 1 tablet (40 mg total) by mouth 2 (two) times daily. 180 tablet 3  . ranolazine (RANEXA) 1000 MG SR tablet Take 1 tablet (1,000 mg total)  by mouth 2 (two) times daily.    . VENTOLIN HFA 108 (90 BASE) MCG/ACT inhaler Inhale 2 puffs into the lungs every 6 (six) hours as needed. (SHORTNESS OR BREATH / WHEEZING)  0   No current facility-administered medications on file prior to visit.    Allergies: No Known Allergies  Review of Systems:  CONSTITUTIONAL: No fevers, chills, night sweats, or weight loss.  EYES: No visual changes or eye pain ENT: No hearing changes.  No history of nose bleeds.   RESPIRATORY: No cough, wheezing and shortness of breath.   CARDIOVASCULAR: Negative for chest pain, and palpitations.   GI: Negative for abdominal discomfort, blood in stools or black stools.  No recent change in bowel habits.  GU:  No history of incontinence.   MUSCLOSKELETAL: +history of joint pain or swelling.  No myalgias.   SKIN: Negative for lesions, rash, and itching.   ENDOCRINE: Negative for cold or heat intolerance, polydipsia or goiter.   PSYCH:  no depression or anxiety symptoms.   NEURO: As Above.   Vital Signs:  BP 140/70 mmHg  Pulse 66  Ht _0  (1.702 m)  Wt 274 lb 3 oz (124.371 kg)  BMI 42.93 kg/m2  SpO2 98%  Neurological Exam: MENTAL STATUS including orientation to time, place, person, recent and remote memory, attention span and concentration, language, and fund of knowledge is normal.  Tremulous speech pattern.  No dysarthria.   CRANIAL NERVES:    Mild bilateral ptosis (old).  Face is symmetric.    MOTOR:  Motor strength is 5/5 in all extremities.  Tone is normal.  No hand tremor with arms outstretching, but there is mild amplitude tremor with finger to nose testing   COORDINATION/GAIT:    Gait narrow based and stable unassisted.  There is no bradykinesia with finger or toe tapping.   Data: EMG 12/01/2014: 1. 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 Overall, these findings are moderately  severe in degree electrically.  2. 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 There is no evidence of a cervical/lumbosacral radiculopathy affecting the right side  Lab Results  Component Value Date   HGBA1C 9.0 01/18/2016   Labs 04/08/2014:  Copper 64*, ceruloplasmin 18, vitamin B12 290, SPEP/UPEP with IFE no M protein  CT head 10/21/2014 and 01/15/2015:  Negative   IMPRESSION/PLAN: 1. Essential tremor involving the hands and voice, stable.   - Parkinson's disease is less likely especially with primary action tremors and lack of rigidity, gait instability, and resting tremors.  - Continue propranolol to 42m twice daily   - Avoid using primidone as it has the risk of reducing the concentration of Ranexa.  2.  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   MG antibody negative  3.  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  EMG with moderately severe neuropathy affecting the right side.  Paresthesias not bothersome enough to start medications  4.  Right ulnar neuropathy at the elbow, stable  Return to clinic in 6 months  The duration of this appointment visit was 20 minutes of face-to-face time with the patient.  Greater than 50% of this time was spent in counseling, explanation of diagnosis, planning of further management, and coordination of care.   Thank you for allowing me to participate in patient's care.  If I can answer any additional questions, I would be pleased to do so.    Sincerely,    Donika K. PPosey Pronto DO

## 2016-03-11 NOTE — Patient Instructions (Signed)
Return to clinic in 6 months.

## 2016-03-11 NOTE — Progress Notes (Signed)
Patient ID: Edward [name], male   DOB: May 08, 1941, 75 y.o.   MRN: [id number]  HPI: Edward [name] is a 75 y.o.-year-old male, returning for f/u for DM2,  dx 1987, insulin-dependent since 1989, uncontrolled, with complications (CAD - s/p 2 AMIs, PN). He moved from [place] in 12/2013. Last visit 1.5 mo ago.  He started to walk in am with a friend. He also plans to start swimming in am soon.  He also started a [employer] >> he loves it!  He lost 8 lbs since last viskt >> feels much better, less SOB, less tremors!  Last hemoglobin A1c was lower after starting U500 insulin, now increasing after switching back to U100 insulin:  Lab Results  Component Value Date   HGBA1C 9.0 01/18/2016   HGBA1C 8.9 10/19/2015   HGBA1C 10.5 08/20/2015   He was on: U500 insulin 30 min before a meal: - breakfast: 16-18 units  - lunch: 16-18 units  - dinner: 14-16 units  U500 sliding scale: 150-200: + 1 unit 201-250: + 2 units 251-300: + 3 units 301-350: + 4 units >350: + 5 units  He had to go back to Novolon 70/30 >> could not restart U500 -  he cannot afford it.  Now on: Insulin  Before breakfast  Before lunch  Before dinner  Bedtime  Novolin 70/30  40  60   Novolin 20 30 30    He was on Metformin >> N/V/D.  Pt checks his sugars 4-5 a day (no log): - am: 52, 60, 79-339 >> 90-120s >> 150-240 >> 125, 156-200, 265 >> 110, 120-130, 234 >> 100-140 >> 68, 110-115 - 2h after b'fast:200 >> 78 x1, 209-337 >> 128-263, 380 >> 170-180 >> 120-125 >> n/c - before lunch: 179-370 >> 68, 178-299, 319  >> 130s >> 200 >> 125,135-169, 212 >> 170s >> 130-200 >> 170-180 - 2h after lunch: n/c >> 222, 375, 545 >> 198-309 >> 121-254 >> 170-180s >> n/c >> n/c - before dinner: 67 x1, 175-312 >> 302, 395 >> 170s >> 200 >> 123, 134-212 >> 150-170 >> 150-200 >> 150-160, 200 - 2h after dinner: n/c >> 301-379>> 99, 128, 368 >> n/c >> 280 >> n/c - bedtime: 200-300 >> 190-463 >> 46, 284 >> <200 >> 190-200 >> 93-198 >> n/c >> 160s  >> n/c - nighttime: n/c >> 300s >> 139-339 >> n/c >> 52 x2 >> 83. Lowest sugar was 93 - he has hypoglycemia awareness at 90  >> 50-70s 4-5x (at night) >> 83 >> 68. Highest sugar was 398 >> 200s >> 300s 3x (takes 5 units) >> low 200s >> 200s.  Has a One Touch Ultra meter.   - mild CKD, last BUN/creatinine:  Lab Results  Component Value Date   BUN 18 12/31/2015   CREATININE 1.33* 12/31/2015  On Lisinopril. ACR 37.6 in 02/11/2014) - Has HL. Last set of lipids: Lab Results  Component Value Date   CHOL 172 12/18/2015   HDL 34* 12/18/2015   LDLCALC 115* 12/18/2015   LDLDIRECT 144.0 07/08/2015   TRIG 117 12/18/2015   CHOLHDL 5.1 12/18/2015  Not on a statin. He gets leg cramps from Lipitor >> cannot tolerate it. - last eye exam was in 02/29/2016 (Dr Zigmund Daniel). Has DR. Had cataracts sx.  - + numbness and tingling in his feet. He saw podiatrist (05/2015) >> has PN. On ASA 81.  Pt was admitted with CP 12/18/2015 >> had another AMI. He was started on Ranexa and Effient, his  dose of Imdur was increased. Plan is to continue medical tx.  I reviewed pt's medications, allergies, PMH, social hx, family hx, and changes were documented in the history of present illness. Otherwise, unchanged from my initial visit note, except he started Effient.  ROS: Constitutional: + weight loss, no fatigue, no hot flushes, + nocturia Eyes: + blurry vision, no xerophthalmia ENT: no sore throat, no nodules palpated in throat, no dysphagia/odynophagia, no hoarseness Cardiovascular: less CP/+ SOB/no palpitations/+ leg swelling Respiratory: no cough/+ SOB/+ wheezing Gastrointestinal: + N/no V/D/C/heartburn Musculoskeletal: no muscle aches/+ joint aches  Skin: no rash Neurological: + tremors/no numbness/tingling/dizziness  PE: BP 122/80 mmHg  Pulse 66  Temp(Src) 97.8 F (36.6 C) (Oral)  Resp 12  Wt 274 lb (124.286 kg)  SpO2 97% Body mass index is 42.9 kg/(m^2). Wt Readings from Last 3 Encounters:  03/11/16  274 lb (124.286 kg)  03/11/16 274 lb 3 oz (124.371 kg)  01/28/16 283 lb 12.8 oz (128.731 kg)   Constitutional: obese, in NAD Eyes: PERRLA, EOMI, no exophthalmos ENT: moist mucous membranes, no thyromegaly, no cervical lymphadenopathy Cardiovascular: RRR, No MRG, + B pitting leg swelling  Respiratory: CTA B Gastrointestinal: abdomen soft, NT, ND, BS+ Musculoskeletal: no deformities, strength intact in all 4 Skin: moist, warm, no rashes Neurological: no tremor with outstretched hands, DTR normal in all 4  ASSESSMENT: 1. DM2, insulin-dependent, uncontrolled, with complications - CAD H/o steroid inj's  Cardiologist: Dr Tamala Julian. Pulmonologist: Dr Lake Bells  PLAN:  1. Patient with long-standing, uncontrolled diabetes, previously on U500 insulin, now back on Novolin 70/30 + Novolin due to finances. Sugars are better, but still high. However, he started eating better and being more active and would like to keep the insulin doses the same for now until next visit >> I agree.  - reviewed together last HbA1c >> 9.0% (higher) - I suggested to:  Patient Instructions   Please continue:  Insulin  Before breakfast  Before lunch  Before dinner   Novolin 70/30  40  60  Novolin 20 30 30    Please come back in 3 months.  - continue checking sugars at different times of the day - check 3-4 times a day, rotating checks - up to date with yearly eye exams  - Return to clinic in 3 mo with sugar log

## 2016-03-14 ENCOUNTER — Telehealth: Payer: Self-pay | Admitting: Interventional Cardiology

## 2016-03-14 NOTE — Telephone Encounter (Signed)
New message     Patient calling the office for samples of medication:   1.  What medication and dosage are you requesting samples for?ranexa 1000 and effient 10mg   2.  Are you currently out of this medication?  Almost out

## 2016-03-14 NOTE — Telephone Encounter (Signed)
New Message  Patient calling the office for samples of medication:   1.  What medication and dosage are you requesting samples for? Ranexa-1000 Effient-10   2.  Are you currently out of this medication? no

## 2016-03-14 NOTE — Telephone Encounter (Signed)
pt called for samples of ranexa & effient, we do not currently have those samples in the office, pt ask to be transferred to [facility], i complied.

## 2016-03-15 ENCOUNTER — Ambulatory Visit (HOSPITAL_COMMUNITY): Payer: Medicare Other

## 2016-03-15 NOTE — Telephone Encounter (Signed)
See note from yesterday where patient was made aware that we do not have any samples available.

## 2016-03-17 ENCOUNTER — Encounter: Payer: Self-pay | Admitting: Interventional Cardiology

## 2016-03-17 ENCOUNTER — Ambulatory Visit (HOSPITAL_COMMUNITY): Payer: Medicare Other

## 2016-03-17 ENCOUNTER — Telehealth: Payer: Self-pay | Admitting: Interventional Cardiology

## 2016-03-17 NOTE — Telephone Encounter (Signed)
Patient would like to know if he can be switched to something more affordable. He stated that he is unable to afford this medication and is not eligible for patient assistance. Please advise. Thanks, MI

## 2016-03-17 NOTE — Telephone Encounter (Signed)
New message      Patient calling the office for samples of medication:   1.  What medication and dosage are you requesting samples for?Effient 10 mg po once and Ranexa 1000 mg po once daily  2.  Are you currently out of this medication? Yes

## 2016-03-21 ENCOUNTER — Other Ambulatory Visit: Payer: Self-pay

## 2016-03-21 ENCOUNTER — Telehealth: Payer: Self-pay | Admitting: Interventional Cardiology

## 2016-03-21 DIAGNOSIS — I25119 Atherosclerotic heart disease of native coronary artery with unspecified angina pectoris: Secondary | ICD-10-CM

## 2016-03-21 MED ORDER — RANOLAZINE ER 1000 MG PO TB12: 500.0000 mg | ORAL_TABLET | Freq: Two times a day (BID) | ORAL | Status: DC

## 2016-03-21 NOTE — Telephone Encounter (Signed)
Will route to Vaughan Basta Reiland,LPN (patient care advocate) to assist

## 2016-03-21 NOTE — Telephone Encounter (Signed)
Left message for patient to call back  

## 2016-03-21 NOTE — Telephone Encounter (Signed)
New message    The pt is calling concerning a form the insurance company is going to fax a form for the medications Effient 10 mg po once daily.   Pt c/o medication issue:  1. Name of Medication: Effient  2. How are you currently taking this medication (dosage and times per day)? 10 mg po once daily  3. Are you having a reaction (difficulty breathing--STAT)? no  4. What is your medication issue? The pt needs a form filled-out for the medications so the insurance company will lower the cost the pt provided case number [id number], the pt states is urgent the pt only has 3 days left of his medication.

## 2016-03-21 NOTE — Telephone Encounter (Signed)
Spoke with patient just now, who states his Effient has been reduced to a $10. Co/pay. He wants to thank everyone for their help in this matter. Now Ranexa needs to be addressed.

## 2016-03-21 NOTE — Telephone Encounter (Signed)
Waiting on a Tier reduction to be approved.

## 2016-03-22 ENCOUNTER — Ambulatory Visit (HOSPITAL_COMMUNITY): Payer: Medicare Other

## 2016-03-24 ENCOUNTER — Ambulatory Visit (HOSPITAL_COMMUNITY): Payer: Medicare Other

## 2016-03-29 ENCOUNTER — Ambulatory Visit: Payer: Medicare Other | Admitting: Pulmonary Disease

## 2016-03-29 ENCOUNTER — Other Ambulatory Visit: Payer: Self-pay | Admitting: Family

## 2016-03-29 ENCOUNTER — Encounter: Payer: Self-pay | Admitting: Physician Assistant

## 2016-03-29 ENCOUNTER — Ambulatory Visit (HOSPITAL_COMMUNITY): Payer: Medicare Other

## 2016-03-29 ENCOUNTER — Encounter: Payer: Self-pay | Admitting: Interventional Cardiology

## 2016-03-29 DIAGNOSIS — M545 Low back pain: Secondary | ICD-10-CM

## 2016-03-30 ENCOUNTER — Telehealth: Payer: Self-pay | Admitting: Physician Assistant

## 2016-03-30 NOTE — Telephone Encounter (Signed)
Pt is calling asking when and if he should stop his Effient.  He is scheduled to have 2 teeth removed 04/15/16.  Please advise!

## 2016-03-30 NOTE — Telephone Encounter (Signed)
   Patient e-mailed me regarding his Effient with upcoming tooth extraction ( he is having two teeth removed).  I spoke with pharmacy and he should hold Effient 7 days prior to dental extraction and resume 24 hours after. I will let Dr. Tamala Julian know we are planning to do this. He is more than 1 year out from previous DES placement, but plan is for lifelong Effient therapy.    Angelena Form PA-C  [facility]

## 2016-03-31 ENCOUNTER — Ambulatory Visit (HOSPITAL_COMMUNITY): Payer: Medicare Other

## 2016-04-05 ENCOUNTER — Ambulatory Visit (HOSPITAL_COMMUNITY): Payer: Medicare Other

## 2016-04-07 ENCOUNTER — Ambulatory Visit (HOSPITAL_COMMUNITY): Payer: Medicare Other

## 2016-04-11 ENCOUNTER — Ambulatory Visit: Payer: Medicare Other | Admitting: Pulmonary Disease

## 2016-04-12 ENCOUNTER — Ambulatory Visit (HOSPITAL_COMMUNITY): Payer: Medicare Other

## 2016-04-13 ENCOUNTER — Telehealth: Payer: Self-pay | Admitting: *Deleted

## 2016-04-13 NOTE — Telephone Encounter (Signed)
Forwarded to Dr. Lowne-Chase. JG//CMA 

## 2016-04-14 ENCOUNTER — Ambulatory Visit (HOSPITAL_COMMUNITY): Payer: Medicare Other

## 2016-04-14 DIAGNOSIS — R627 Adult failure to thrive: Secondary | ICD-10-CM | POA: Diagnosis not present

## 2016-04-14 DIAGNOSIS — M6281 Muscle weakness (generalized): Secondary | ICD-10-CM | POA: Diagnosis not present

## 2016-04-19 ENCOUNTER — Observation Stay (HOSPITAL_COMMUNITY)
Admission: EM | Admit: 2016-04-19 | Discharge: 2016-04-22 | Disposition: A | Discharge status: Home / Self Care | Payer: Medicare Other | Attending: Internal Medicine | Admitting: Internal Medicine

## 2016-04-19 ENCOUNTER — Ambulatory Visit: Payer: Medicare Other | Admitting: Adult Health

## 2016-04-19 ENCOUNTER — Other Ambulatory Visit: Payer: Self-pay

## 2016-04-19 ENCOUNTER — Encounter (HOSPITAL_COMMUNITY): Payer: Self-pay | Admitting: Emergency Medicine

## 2016-04-19 ENCOUNTER — Ambulatory Visit (HOSPITAL_COMMUNITY)
Admission: RE | Admit: 2016-04-19 | Discharge: 2016-04-19 | Disposition: A | Discharge status: Home / Self Care | Payer: Medicare Other | Source: Ambulatory Visit | Attending: Pulmonary Disease | Admitting: Pulmonary Disease

## 2016-04-19 ENCOUNTER — Ambulatory Visit (HOSPITAL_COMMUNITY): Payer: Medicare Other

## 2016-04-19 ENCOUNTER — Emergency Department (HOSPITAL_COMMUNITY): Payer: Medicare Other

## 2016-04-19 DIAGNOSIS — Z794 Long term (current) use of insulin: Secondary | ICD-10-CM | POA: Diagnosis not present

## 2016-04-19 DIAGNOSIS — I440 Atrioventricular block, first degree: Secondary | ICD-10-CM | POA: Insufficient documentation

## 2016-04-19 DIAGNOSIS — R0600 Dyspnea, unspecified: Secondary | ICD-10-CM

## 2016-04-19 DIAGNOSIS — Z8249 Family history of ischemic heart disease and other diseases of the circulatory system: Secondary | ICD-10-CM | POA: Diagnosis not present

## 2016-04-19 DIAGNOSIS — I2510 Atherosclerotic heart disease of native coronary artery without angina pectoris: Secondary | ICD-10-CM | POA: Diagnosis not present

## 2016-04-19 DIAGNOSIS — N400 Enlarged prostate without lower urinary tract symptoms: Secondary | ICD-10-CM | POA: Insufficient documentation

## 2016-04-19 DIAGNOSIS — N200 Calculus of kidney: Secondary | ICD-10-CM | POA: Insufficient documentation

## 2016-04-19 DIAGNOSIS — E785 Hyperlipidemia, unspecified: Secondary | ICD-10-CM | POA: Diagnosis not present

## 2016-04-19 DIAGNOSIS — E119 Type 2 diabetes mellitus without complications: Secondary | ICD-10-CM | POA: Diagnosis not present

## 2016-04-19 DIAGNOSIS — Z6841 Body Mass Index (BMI) 40.0 and over, adult: Secondary | ICD-10-CM | POA: Diagnosis not present

## 2016-04-19 DIAGNOSIS — I110 Hypertensive heart disease with heart failure: Secondary | ICD-10-CM | POA: Diagnosis not present

## 2016-04-19 DIAGNOSIS — R079 Chest pain, unspecified: Principal | ICD-10-CM | POA: Diagnosis present

## 2016-04-19 DIAGNOSIS — R0789 Other chest pain: Secondary | ICD-10-CM

## 2016-04-19 DIAGNOSIS — E1151 Type 2 diabetes mellitus with diabetic peripheral angiopathy without gangrene: Secondary | ICD-10-CM

## 2016-04-19 DIAGNOSIS — K219 Gastro-esophageal reflux disease without esophagitis: Secondary | ICD-10-CM | POA: Diagnosis not present

## 2016-04-19 DIAGNOSIS — K5900 Constipation, unspecified: Secondary | ICD-10-CM | POA: Diagnosis not present

## 2016-04-19 DIAGNOSIS — I10 Essential (primary) hypertension: Secondary | ICD-10-CM

## 2016-04-19 DIAGNOSIS — Z955 Presence of coronary angioplasty implant and graft: Secondary | ICD-10-CM | POA: Insufficient documentation

## 2016-04-19 DIAGNOSIS — Z7982 Long term (current) use of aspirin: Secondary | ICD-10-CM | POA: Insufficient documentation

## 2016-04-19 DIAGNOSIS — Z8673 Personal history of transient ischemic attack (TIA), and cerebral infarction without residual deficits: Secondary | ICD-10-CM | POA: Diagnosis not present

## 2016-04-19 DIAGNOSIS — G4730 Sleep apnea, unspecified: Secondary | ICD-10-CM | POA: Insufficient documentation

## 2016-04-19 DIAGNOSIS — E876 Hypokalemia: Secondary | ICD-10-CM | POA: Diagnosis not present

## 2016-04-19 DIAGNOSIS — G250 Essential tremor: Secondary | ICD-10-CM | POA: Insufficient documentation

## 2016-04-19 DIAGNOSIS — R42 Dizziness and giddiness: Secondary | ICD-10-CM | POA: Diagnosis not present

## 2016-04-19 DIAGNOSIS — Z86010 Personal history of colonic polyps: Secondary | ICD-10-CM | POA: Insufficient documentation

## 2016-04-19 DIAGNOSIS — R55 Syncope and collapse: Secondary | ICD-10-CM | POA: Diagnosis present

## 2016-04-19 DIAGNOSIS — I5032 Chronic diastolic (congestive) heart failure: Secondary | ICD-10-CM | POA: Insufficient documentation

## 2016-04-19 DIAGNOSIS — J849 Interstitial pulmonary disease, unspecified: Secondary | ICD-10-CM

## 2016-04-19 DIAGNOSIS — R109 Unspecified abdominal pain: Secondary | ICD-10-CM

## 2016-04-19 DIAGNOSIS — G4733 Obstructive sleep apnea (adult) (pediatric): Secondary | ICD-10-CM | POA: Diagnosis present

## 2016-04-19 DIAGNOSIS — Z9861 Coronary angioplasty status: Secondary | ICD-10-CM

## 2016-04-19 LAB — PULMONARY FUNCTION TEST
DL/VA % PRED: 111 %
DL/VA: 4.98 ml/min/mmHg/L
DLCO unc % pred: 69 %
DLCO unc: 20.47 ml/min/mmHg
FEF 25-75 POST: 2.73 L/s
FEF 25-75 PRE: 1.97 L/s
FEF2575-%Change-Post: 38 %
FEF2575-%PRED-PRE: 91 %
FEF2575-%Pred-Post: 127 %
FEV1-%Change-Post: 9 %
FEV1-%PRED-PRE: 70 %
FEV1-%Pred-Post: 76 %
FEV1-PRE: 1.81 L
FEV1-Post: 1.98 L
FEV1FVC-%Change-Post: 1 %
FEV1FVC-%PRED-PRE: 107 %
FEV6-%CHANGE-POST: 7 %
FEV6-%PRED-PRE: 67 %
FEV6-%Pred-Post: 72 %
FEV6-POST: 2.38 L
FEV6-Pre: 2.21 L
FEV6FVC-%PRED-POST: 105 %
FEV6FVC-%Pred-Pre: 105 %
FVC-%CHANGE-POST: 7 %
FVC-%PRED-POST: 68 %
FVC-%Pred-Pre: 63 %
FVC-Post: 2.38 L
FVC-Pre: 2.21 L
POST FEV6/FVC RATIO: 100 %
PRE FEV1/FVC RATIO: 82 %
Post FEV1/FVC ratio: 83 %
Pre FEV6/FVC Ratio: 100 %

## 2016-04-19 LAB — COMPREHENSIVE METABOLIC PANEL
ALT: 14 U/L — ABNORMAL LOW (ref 17–63)
AST: 17 U/L (ref 15–41)
Albumin: 3.1 g/dL — ABNORMAL LOW (ref 3.5–5.0)
Alkaline Phosphatase: 83 U/L (ref 38–126)
Anion gap: 5 (ref 5–15)
BILIRUBIN TOTAL: 0.4 mg/dL (ref 0.3–1.2)
BUN: 11 mg/dL (ref 6–20)
CHLORIDE: 106 mmol/L (ref 101–111)
CO2: 27 mmol/L (ref 22–32)
Calcium: 8.9 mg/dL (ref 8.9–10.3)
Creatinine, Ser: 1.16 mg/dL (ref 0.61–1.24)
Glucose, Bld: 77 mg/dL (ref 65–99)
POTASSIUM: 3.4 mmol/L — AB (ref 3.5–5.1)
Sodium: 138 mmol/L (ref 135–145)
TOTAL PROTEIN: 5.8 g/dL — AB (ref 6.5–8.1)

## 2016-04-19 LAB — CBC WITH DIFFERENTIAL/PLATELET
BASOS ABS: 0 10*3/uL (ref 0.0–0.1)
Basophils Relative: 0 %
EOS PCT: 3 %
Eosinophils Absolute: 0.2 10*3/uL (ref 0.0–0.7)
HEMATOCRIT: 36.2 % — AB (ref 39.0–52.0)
Hemoglobin: 12 g/dL — ABNORMAL LOW (ref 13.0–17.0)
LYMPHS PCT: 24 %
Lymphs Abs: 1.7 10*3/uL (ref 0.7–4.0)
MCH: 27.9 pg (ref 26.0–34.0)
MCHC: 33.1 g/dL (ref 30.0–36.0)
MCV: 84.2 fL (ref 78.0–100.0)
MONO ABS: 0.9 10*3/uL (ref 0.1–1.0)
MONOS PCT: 13 %
NEUTROS ABS: 4.3 10*3/uL (ref 1.7–7.7)
Neutrophils Relative %: 60 %
PLATELETS: 220 10*3/uL (ref 150–400)
RBC: 4.3 MIL/uL (ref 4.22–5.81)
RDW: 13.4 % (ref 11.5–15.5)
WBC: 7.2 10*3/uL (ref 4.0–10.5)

## 2016-04-19 LAB — URINALYSIS, DIPSTICK ONLY
BILIRUBIN URINE: NEGATIVE
GLUCOSE, UA: NEGATIVE mg/dL
KETONES UR: NEGATIVE mg/dL
Nitrite: NEGATIVE
PH: 6.5 (ref 5.0–8.0)
PROTEIN: NEGATIVE mg/dL
Specific Gravity, Urine: 1.01 (ref 1.005–1.030)

## 2016-04-19 LAB — I-STAT TROPONIN, ED: TROPONIN I, POC: 0 ng/mL (ref 0.00–0.08)

## 2016-04-19 LAB — CBG MONITORING, ED
GLUCOSE-CAPILLARY: 72 mg/dL (ref 65–99)
Glucose-Capillary: 75 mg/dL (ref 65–99)

## 2016-04-19 LAB — MAGNESIUM: MAGNESIUM: 1.8 mg/dL (ref 1.7–2.4)

## 2016-04-19 LAB — LIPASE, BLOOD: LIPASE: 17 U/L (ref 11–51)

## 2016-04-19 LAB — TROPONIN I

## 2016-04-19 MED ORDER — CLONAZEPAM 0.5 MG PO TABS: 1.0000 mg | ORAL_TABLET | Freq: Two times a day (BID) | ORAL | Status: DC | PRN

## 2016-04-19 MED ORDER — FENOFIBRATE 160 MG PO TABS
160.0000 mg | ORAL_TABLET | Freq: Every day | ORAL | Status: DC
  Administered 2016-04-20 – 2016-04-22 (×3): 160 mg via ORAL
  Filled 2016-04-19 (×3): qty 1

## 2016-04-19 MED ORDER — PANTOPRAZOLE SODIUM 40 MG PO TBEC
40.0000 mg | DELAYED_RELEASE_TABLET | Freq: Every day | ORAL | Status: DC
  Administered 2016-04-20 – 2016-04-22 (×3): 40 mg via ORAL
  Filled 2016-04-19 (×3): qty 1

## 2016-04-19 MED ORDER — POTASSIUM CHLORIDE CRYS ER 20 MEQ PO TBCR
20.0000 meq | EXTENDED_RELEASE_TABLET | Freq: Every day | ORAL | Status: DC
  Administered 2016-04-20 – 2016-04-22 (×3): 20 meq via ORAL
  Filled 2016-04-19 (×3): qty 1

## 2016-04-19 MED ORDER — DOCUSATE SODIUM 100 MG PO CAPS
100.0000 mg | ORAL_CAPSULE | Freq: Two times a day (BID) | ORAL | Status: DC
  Administered 2016-04-20: 100 mg via ORAL
  Filled 2016-04-19: qty 1

## 2016-04-19 MED ORDER — ALBUTEROL SULFATE (2.5 MG/3ML) 0.083% IN NEBU: 3.0000 mL | INHALATION_SOLUTION | Freq: Four times a day (QID) | RESPIRATORY_TRACT | Status: DC | PRN

## 2016-04-19 MED ORDER — NITROGLYCERIN 2 % TD OINT
0.5000 [in_us] | TOPICAL_OINTMENT | Freq: Four times a day (QID) | TRANSDERMAL | Status: DC
  Administered 2016-04-20 – 2016-04-22 (×11): 0.5 [in_us] via TOPICAL
  Filled 2016-04-19: qty 30

## 2016-04-19 MED ORDER — NITROGLYCERIN 0.4 MG SL SUBL: 0.4000 mg | SUBLINGUAL_TABLET | SUBLINGUAL | Status: DC | PRN

## 2016-04-19 MED ORDER — AMLODIPINE BESYLATE 5 MG PO TABS
10.0000 mg | ORAL_TABLET | Freq: Every day | ORAL | Status: DC
  Administered 2016-04-20 – 2016-04-22 (×3): 10 mg via ORAL
  Filled 2016-04-19 (×3): qty 2

## 2016-04-19 MED ORDER — RANOLAZINE ER 500 MG PO TB12
500.0000 mg | ORAL_TABLET | Freq: Two times a day (BID) | ORAL | Status: DC
  Administered 2016-04-20 – 2016-04-22 (×5): 500 mg via ORAL
  Filled 2016-04-19 (×5): qty 1

## 2016-04-19 MED ORDER — SODIUM CHLORIDE 0.9% FLUSH
3.0000 mL | Freq: Two times a day (BID) | INTRAVENOUS | Status: DC
  Administered 2016-04-20 – 2016-04-22 (×6): 3 mL via INTRAVENOUS

## 2016-04-19 MED ORDER — ISOSORBIDE MONONITRATE ER 60 MG PO TB24
60.0000 mg | ORAL_TABLET | Freq: Every day | ORAL | Status: DC
  Administered 2016-04-20 – 2016-04-22 (×3): 60 mg via ORAL
  Filled 2016-04-19 (×3): qty 1

## 2016-04-19 MED ORDER — INSULIN ASPART 100 UNIT/ML ~~LOC~~ SOLN
0.0000 [IU] | Freq: Three times a day (TID) | SUBCUTANEOUS | Status: DC
  Administered 2016-04-20: 15 [IU] via SUBCUTANEOUS
  Administered 2016-04-20 (×2): 8 [IU] via SUBCUTANEOUS
  Administered 2016-04-21 (×2): 5 [IU] via SUBCUTANEOUS
  Administered 2016-04-21 – 2016-04-22 (×4): 3 [IU] via SUBCUTANEOUS

## 2016-04-19 MED ORDER — POLYETHYLENE GLYCOL 3350 17 G PO PACK
17.0000 g | PACK | Freq: Once | ORAL | Status: AC
  Administered 2016-04-20: 17 g via ORAL
  Filled 2016-04-19: qty 1

## 2016-04-19 MED ORDER — POLYVINYL ALCOHOL 1.4 % OP SOLN
2.0000 [drp] | Freq: Two times a day (BID) | OPHTHALMIC | Status: DC
  Administered 2016-04-20 – 2016-04-22 (×5): 2 [drp] via OPHTHALMIC
  Filled 2016-04-19: qty 15

## 2016-04-19 MED ORDER — METOPROLOL TARTRATE 25 MG PO TABS
50.0000 mg | ORAL_TABLET | Freq: Two times a day (BID) | ORAL | Status: DC
  Administered 2016-04-20 – 2016-04-22 (×3): 50 mg via ORAL
  Filled 2016-04-19 (×5): qty 2

## 2016-04-19 MED ORDER — INSULIN GLARGINE 100 UNIT/ML ~~LOC~~ SOLN
37.0000 [IU] | Freq: Two times a day (BID) | SUBCUTANEOUS | Status: DC
  Administered 2016-04-20 – 2016-04-22 (×6): 37 [IU] via SUBCUTANEOUS
  Filled 2016-04-19 (×7): qty 0.37

## 2016-04-19 MED ORDER — SODIUM CHLORIDE 0.9 % IV SOLN: 250.0000 mL | INTRAVENOUS | Status: DC | PRN

## 2016-04-19 MED ORDER — INSULIN ASPART 100 UNIT/ML ~~LOC~~ SOLN
4.0000 [IU] | Freq: Three times a day (TID) | SUBCUTANEOUS | Status: DC
  Administered 2016-04-20 – 2016-04-22 (×9): 4 [IU] via SUBCUTANEOUS

## 2016-04-19 MED ORDER — PRAVASTATIN SODIUM 40 MG PO TABS
40.0000 mg | ORAL_TABLET | Freq: Every day | ORAL | Status: DC
  Administered 2016-04-20 – 2016-04-21 (×2): 40 mg via ORAL
  Filled 2016-04-19 (×2): qty 1

## 2016-04-19 MED ORDER — ALBUTEROL SULFATE (2.5 MG/3ML) 0.083% IN NEBU
2.5000 mg | INHALATION_SOLUTION | Freq: Once | RESPIRATORY_TRACT | Status: AC
  Administered 2016-04-19: 2.5 mg via RESPIRATORY_TRACT

## 2016-04-19 MED ORDER — ASPIRIN EC 81 MG PO TBEC
81.0000 mg | DELAYED_RELEASE_TABLET | Freq: Every day | ORAL | Status: DC
  Administered 2016-04-20 – 2016-04-22 (×3): 81 mg via ORAL
  Filled 2016-04-19 (×3): qty 1

## 2016-04-19 MED ORDER — FLUTICASONE FUROATE-VILANTEROL 100-25 MCG/INH IN AEPB
1.0000 | INHALATION_SPRAY | Freq: Every day | RESPIRATORY_TRACT | Status: DC
  Administered 2016-04-20 – 2016-04-22 (×3): 1 via RESPIRATORY_TRACT
  Filled 2016-04-19: qty 28

## 2016-04-19 MED ORDER — MORPHINE SULFATE (PF) 4 MG/ML IV SOLN
4.0000 mg | Freq: Once | INTRAVENOUS | Status: AC
  Administered 2016-04-19: 4 mg via INTRAVENOUS
  Filled 2016-04-19: qty 1

## 2016-04-19 MED ORDER — ONDANSETRON HCL 4 MG/2ML IJ SOLN
4.0000 mg | Freq: Four times a day (QID) | INTRAMUSCULAR | Status: DC | PRN
  Administered 2016-04-20: 4 mg via INTRAVENOUS
  Filled 2016-04-19: qty 2

## 2016-04-19 MED ORDER — DM-GUAIFENESIN ER 30-600 MG PO TB12: 1.0000 | ORAL_TABLET | Freq: Two times a day (BID) | ORAL | Status: DC | PRN

## 2016-04-19 MED ORDER — MORPHINE SULFATE (PF) 2 MG/ML IV SOLN
2.0000 mg | INTRAVENOUS | Status: DC | PRN
  Administered 2016-04-20 – 2016-04-21 (×4): 2 mg via INTRAVENOUS
  Filled 2016-04-19 (×4): qty 1

## 2016-04-19 MED ORDER — PRASUGREL HCL 10 MG PO TABS
10.0000 mg | ORAL_TABLET | Freq: Every day | ORAL | Status: DC
  Administered 2016-04-20 – 2016-04-22 (×3): 10 mg via ORAL
  Filled 2016-04-19 (×3): qty 1

## 2016-04-19 MED ORDER — SODIUM CHLORIDE 0.9% FLUSH: 3.0000 mL | INTRAVENOUS | Status: DC | PRN

## 2016-04-19 MED ORDER — PROPRANOLOL HCL 40 MG PO TABS
40.0000 mg | ORAL_TABLET | Freq: Two times a day (BID) | ORAL | Status: DC
  Administered 2016-04-20 – 2016-04-22 (×4): 40 mg via ORAL
  Filled 2016-04-19 (×7): qty 1

## 2016-04-19 MED ORDER — ACETAMINOPHEN 325 MG PO TABS
650.0000 mg | ORAL_TABLET | ORAL | Status: DC | PRN
  Administered 2016-04-20 – 2016-04-22 (×3): 650 mg via ORAL
  Filled 2016-04-19 (×2): qty 2

## 2016-04-19 MED ORDER — HEPARIN SODIUM (PORCINE) 5000 UNIT/ML IJ SOLN
5000.0000 [IU] | Freq: Three times a day (TID) | INTRAMUSCULAR | Status: DC
Start: 2016-04-19 — End: 2016-04-22
  Administered 2016-04-20 – 2016-04-22 (×7): 5000 [IU] via SUBCUTANEOUS
  Filled 2016-04-19 (×8): qty 1

## 2016-04-19 MED ORDER — POTASSIUM CHLORIDE CRYS ER 20 MEQ PO TBCR
40.0000 meq | EXTENDED_RELEASE_TABLET | Freq: Once | ORAL | Status: AC
  Administered 2016-04-19: 40 meq via ORAL
  Filled 2016-04-19: qty 2

## 2016-04-19 MED ORDER — FUROSEMIDE 20 MG PO TABS
40.0000 mg | ORAL_TABLET | Freq: Every day | ORAL | Status: DC
  Administered 2016-04-20 – 2016-04-22 (×3): 40 mg via ORAL
  Filled 2016-04-19 (×3): qty 2

## 2016-04-19 NOTE — H&P (Signed)
History and Physical    Edward [name] YPP:[id number] DOB: 06/27/1941 DOA: 04/19/2016  PCP: Ann Held, DO   Patient coming from: Home   Chief Complaint: Chest pain   HPI: Edward [name] is a 75 y.o. male with medical history significant for hypertension, hyperlipidemia, insulin-dependent diabetes mellitus, GERD, chronic diastolic CHF, and coronary artery disease with DES to LAD who presents to the ED with left anterior chest pain radiating into both arms which developed approximately one hour prior to arrival. Patient reports some recent constipation, but has otherwise been in his usual state of health with no recent fevers, chills, sore throat, dyspnea, cough, abdominal pain, nausea, vomiting, or diarrhea. He was straining to move his bowels at approximately 5:45 PM on the evening of his presentation when he developed acute onset of left anterior chest pain described as severe, persistent, with no alleviating or exacerbating factors identified, radiating to both arms, and not improved with nitroglycerin 2 at home. He notes associated nausea and lightheadedness but denies syncope, dyspnea, or diaphoresis. Patient activated EMS for transport to the hospital. En route, he was given a 324 mg aspirin and a third dose of nitroglycerin and had some improvement in his pain, rating it a 6/10 on arrival.  ED Course: Upon arrival to the ED, patient is found to be afebrile, saturating well on room air, and with vital signs stable. EKG demonstrates a sinus rhythm with first-degree AV nodal block and troponin is undetectable. Chest x-ray is negative for acute cardiopulmonary disease. CMP features a mild hypokalemia and mild hypoalbuminemia. CBC is notable for hemoglobin of 12 with normal MCV, apparently stable relative to prior measurements. 40 mEq of oral potassium was administered in the emergency department and 4 mg IV morphine was given with incomplete resolution of the patient's chest pain. Patient  has remained hemodynamically stable in the ED. Given his persistent chest pain and known CAD, he'll be observed in the stepdown unit for ongoing evaluation and management of chest pain concerning for unstable angina.  Review of Systems:  All other systems reviewed and apart from HPI, are negative.  Past Medical History  Diagnosis Date  . Hypertension   . CAD (coronary artery disease)     a. 01/2015 DES to LAD  b. 12/2015: [place] 85% oLCx lesion--> rx therapy.  [facility] TIA (transient ischemic attack) 2002  . Dyslipidemia, goal LDL below 70 01/29/2014  . GERD (gastroesophageal reflux disease)   . Enlarged prostate   . DM type 2 (diabetes mellitus, type 2), insulin dependent 01/29/2014    fasting cbg 50-120 with new regimen  . Sleep apnea     does not use cpap (06/04/2015)  . Essential tremor     a. on proprnolol  . Adenomatous colon polyp 04/2011  . Internal hemorrhoid   . Chronic diastolic CHF (congestive heart failure) [facility])     Past Surgical History  Procedure Laterality Date  . Total knee arthroplasty Bilateral   . Lumbar disc surgery    . Cataract extraction Bilateral   . Joint replacement    . Back surgery    . Cataract extraction, bilateral Bilateral   . Shoulder arthroscopy Right 07/30/2014    Procedure: Right Shoulder Arthroscopy, Debridement, Decompression, Manipulation Under Anesthesia;  Surgeon: Newt Minion, MD;  Location: [facility];  Service: Orthopedics;  Laterality: Right;  . Left heart catheterization with coronary angiogram N/A 01/28/2014    Procedure: LEFT HEART CATHETERIZATION WITH CORONARY ANGIOGRAM;  Surgeon: [name],  MD;  Location: [facility] CATH LAB;  Service: Cardiovascular;  Laterality: N/A;  . Left heart catheterization with coronary angiogram N/A 01/30/2015    Procedure: LEFT HEART CATHETERIZATION WITH CORONARY ANGIOGRAM;  Surgeon: Belva Crome, MD;  Location: [facility] C[facility];  Service: Cardiovascular;  Laterality: N/A;  . Cardiac catheterization  01/28/14    + CAD treat  medically  . Coronary angioplasty with stent placement  01/30/2015    DES Promus  [place] to LAD by Dr Tamala Julian  . Cardiac catheterization N/A 12/21/2015    Procedure: Right/Left Heart Cath and Coronary Angiography;  Surgeon: Belva Crome, MD;  Location: [facility] CV LAB;  Service: Cardiovascular;  Laterality: N/A;     reports that he has never smoked. He has never used smokeless tobacco. He reports that he does not drink alcohol or use illicit drugs.  No Known Allergies  Family History  Problem Relation Age of Onset  . CAD Brother     2 brothers - CABG  . Alzheimer's disease Sister   . CAD Sister   . Prostate cancer Brother   . Diabetes Other     entire family  . Asthma Brother     2 brothers      Prior to Admission medications   Medication Sig Start Date End Date Taking? Authorizing Provider  amLODipine (NORVASC) 10 MG tablet take 1 tablet by mouth once daily 01/04/16  Yes Yvonne R Lowne Chase, DO  aspirin EC 81 MG tablet Take 81 mg by mouth daily.   Yes Historical Provider, MD  B-D INS SYR ULTRAFINE 1CC/31G 31G X 5/16" 1 ML MISC inject three times a day AS INSTRUCTED 12/16/15  Yes Historical Provider, MD  BD INSULIN SYRINGE U-500 31G X 6MM 0.5 ML MISC  12/01/15  Yes Historical Provider, MD  Ciclopirox 1 % shampoo Apply 1 application topically at bedtime. At night after showering 11/20/14  Yes Historical Provider, MD  clonazePAM (KLONOPIN) 1 MG tablet Take 1 tablet (1 mg total) by mouth at bedtime as needed for anxiety. 03/01/16  Yes Yvonne R Lowne Chase, DO  dextromethorphan-guaiFENesin (MUCINEX DM) 30-600 MG 12hr tablet Take 1 tablet by mouth 2 (two) times daily. Patient taking differently: Take 1 tablet by mouth 2 (two) times daily as needed.  11/01/15  Yes Debbe Odea, MD  diclofenac sodium (VOLTAREN) 1 % GEL Apply 2 g topically 4 (four) times daily.   Yes Historical Provider, MD  esomeprazole (NEXIUM) 40 MG capsule Take 1 capsule (40 mg total) by mouth daily at 12 noon. 01/01/16  Yes  Yvonne R Lowne Chase, DO  fenofibrate 160 MG tablet Take 1 tablet (160 mg total) by mouth daily. 01/01/16  Yes Yvonne R Lowne Chase, DO  fluticasone furoate-vilanterol (BREO ELLIPTA) 100-25 MCG/INH AEPB Inhale 1 puff into the lungs daily. 01/01/16  Yes Yvonne R Lowne Chase, DO  furosemide (LASIX) 40 MG tablet Take 1 tablet (40 mg total) by mouth daily. 01/01/16  Yes Alferd Apa Lowne Chase, DO  glucose blood test strip Use to test blood sugar 4 times daily instructed. Dx: E11.59 11/06/15  Yes Philemon Kingdom, MD  insulin NPH-regular Human (NOVOLIN 70/30) (70-30) 100 UNIT/ML injection Inject under skin 40 units in the morning and 60 units before dinner 01/18/16  Yes Philemon Kingdom, MD  insulin regular (NOVOLIN R) 100 units/mL injection Inject 0.2-0.3 mLs (20-30 Units total) into the skin 3 (three) times daily before meals. 02/26/16  Yes Philemon Kingdom, MD  Insulin Syringes, Disposable, U-100 1 ML  MISC Use 4x a day 08/20/15  Yes Philemon Kingdom, MD  isosorbide mononitrate (IMDUR) 60 MG 24 hr tablet Take 1 tablet (60 mg total) by mouth daily. 01/01/16  Yes Yvonne R Lowne Chase, DO  metoprolol (LOPRESSOR) 50 MG tablet Take 1 tablet (50 mg total) by mouth 2 (two) times daily. 01/01/16  Yes Yvonne R Lowne Chase, DO  nitroGLYCERIN (NITROSTAT) 0.4 MG SL tablet Place 1 tablet (0.4 mg total) under the tongue every 5 (five) minutes as needed for chest pain (CP or SOB). 02/08/16  Yes Luke K Kilroy, PA-C  pantoprazole (PROTONIX) 40 MG tablet Take 40 mg by mouth daily. 01/04/16  Yes Historical Provider, MD  Polyvinyl Alcohol-Povidone (REFRESH OP) Place 1 drop into both eyes daily as needed (for dry eyes). Reported on 12/25/2015   Yes Historical Provider, MD  potassium chloride SA (K-DUR,KLOR-CON) 20 MEQ tablet Take 1 tablet (20 mEq total) by mouth daily. 01/01/16  Yes Yvonne R Lowne Chase, DO  prasugrel (EFFIENT) 10 MG TABS tablet Take 1 tablet (10 mg total) by mouth daily. 01/01/16  Yes Yvonne R Lowne Chase, DO  propranolol  (INDERAL) 40 MG tablet Take 1 tablet (40 mg total) by mouth 2 (two) times daily. 01/04/16  Yes Donika Keith Rake, DO  ranolazine (RANEXA) 1000 MG SR tablet Take 1 tablet (1,000 mg total) by mouth 2 (two) times daily. 03/21/16  Yes Belva Crome, MD  VENTOLIN HFA 108 (90 BASE) MCG/ACT inhaler Inhale 2 puffs into the lungs every 6 (six) hours as needed. (SHORTNESS OR BREATH / WHEEZING) 06/22/15  Yes Historical Provider, MD  Blood Glucose Monitoring Suppl (ONE TOUCH ULTRA SYSTEM KIT) W/DEVICE KIT Use to test blood sugar daily as instructed. Dx code: E11.59 12/30/14   Philemon Kingdom, MD  dicyclomine (BENTYL) 10 MG capsule Take 1 capsule (10 mg total) by mouth 3 (three) times daily before meals. Patient not taking: Reported on 04/19/2016 01/01/16   Rosalita Chessman Chase, DO  pravastatin (PRAVACHOL) 40 MG tablet Take 1 tablet (40 mg total) by mouth daily. 01/01/16   Ann Held, DO    Physical Exam: Filed Vitals:   04/19/16 1915 04/19/16 1930 04/19/16 1945 04/19/16 2000  BP: 137/62 119/62 117/46 133/59  Pulse: 59 57 57 59  Temp:      TempSrc:      Resp: _0 Height:      Weight:      SpO2: 100% 100% 97% 97%      Constitutional: NAD, calm, comfortable Eyes: PERTLA, lids and conjunctivae normal ENMT: Mucous membranes are moist. Posterior pharynx clear of any exudate or lesions.   Neck: normal, supple, no masses, no thyromegaly Respiratory: clear to auscultation bilaterally, no wheezing, no crackles. Normal respiratory effort.    Cardiovascular: S1 & S2 heard, regular rate and rhythm. Trace pretibial edema b/l. No significant JVD. Abdomen: No distension, no tenderness, no masses palpated. Bowel sounds normal.  Musculoskeletal: no clubbing / cyanosis. No joint deformity upper and lower extremities. Normal muscle tone.  Skin: no significant rashes, lesions, ulcers. Warm, dry, well-perfused. Neurologic: CN 2-12 grossly intact. Sensation intact, DTR normal. Strength 5/5 in all 4 limbs.    Psychiatric: Normal judgment and insight. Alert and oriented x 3. Normal mood and affect.     Labs on Admission: I have personally reviewed following labs and imaging studies  CBC:  Recent Labs Lab 04/19/16 2007  WBC 7.2  NEUTROABS 4.3  HGB 12.0*  HCT 36.2*  MCV  84.2  PLT 829   Basic Metabolic Panel:  Recent Labs Lab 04/19/16 2008  NA 138  K 3.4*  CL 106  CO2 27  GLUCOSE 77  BUN 11  CREATININE 1.16  CALCIUM 8.9   GFR: Estimated Creatinine Clearance: 72.5 mL/min (by C-G formula based on Cr of 1.16). Liver Function Tests:  Recent Labs Lab 04/19/16 2008  AST 17  ALT 14*  ALKPHOS 83  BILITOT 0.4  PROT 5.8*  ALBUMIN 3.1*    Recent Labs Lab 04/19/16 2008  LIPASE 17   No results for input(s): AMMONIA in the last 168 hours. Coagulation Profile: No results for input(s): INR, PROTIME in the last 168 hours. Cardiac Enzymes: No results for input(s): CKTOTAL, CKMB, CKMBINDEX, TROPONINI in the last 168 hours. BNP (last 3 results)  Recent Labs  07/17/15 1139 07/24/15 0841  PROBNP 35.0 24.0   HbA1C: No results for input(s): HGBA1C in the last 72 hours. CBG:  Recent Labs Lab 04/19/16 1912 04/19/16 1951  GLUCAP 75 72   Lipid Profile: No results for input(s): CHOL, HDL, LDLCALC, TRIG, CHOLHDL, LDLDIRECT in the last 72 hours. Thyroid Function Tests: No results for input(s): TSH, T4TOTAL, FREET4, T3FREE, THYROIDAB in the last 72 hours. Anemia Panel: No results for input(s): VITAMINB12, FOLATE, FERRITIN, TIBC, IRON, RETICCTPCT in the last 72 hours. Urine analysis:    Component Value Date/Time   COLORURINE YELLOW 04/19/2016 2203   APPEARANCEUR CLOUDY* 04/19/2016 2203   LABSPEC 1.010 04/19/2016 2203   PHURINE 6.5 04/19/2016 2203   GLUCOSEU NEGATIVE 04/19/2016 2203   HGBUR MODERATE* 04/19/2016 2203   BILIRUBINUR NEGATIVE 04/19/2016 2203   BILIRUBINUR neg 07/17/2014 1523   KETONESUR NEGATIVE 04/19/2016 2203   PROTEINUR NEGATIVE 04/19/2016 2203    PROTEINUR 1 07/17/2014 1523   UROBILINOGEN 0.2 07/17/2014 1523   NITRITE NEGATIVE 04/19/2016 2203   NITRITE neg 07/17/2014 1523   LEUKOCYTESUR TRACE* 04/19/2016 2203   Sepsis Labs: _0 (procalcitonin:4,lacticidven:4) )No results found for this or any previous visit (from the past 240 hour(s)).   Radiological Exams on Admission: Dg Chest 2 View  04/19/2016  CLINICAL DATA:  75 year old male with chest pain EXAM: CHEST  2 VIEW COMPARISON:  Chest radiograph dated 12/18/2015 FINDINGS: There is shallow inspiration. There bibasilar subsegmental atelectatic changes of the lungs. There is no focal consolidation, pleural effusion, or pneumothorax. Stable mild cardiomegaly. Coronary vascular stents noted. No acute osseous pathology. IMPRESSION: No active cardiopulmonary disease. Electronically Signed   By: Anner Crete M.D.   On: 04/19/2016 22:07    EKG: Independently reviewed. Sinus rhythm, first degree AV block   Assessment/Plan  1. Chest pain  - Started while straining to move bowels, slightly improved with third SL NTG  - ASA 324 mg given en route to ED  - EKG with no acute ischemic features, will repeat - Initial troponin 0.00; obtaining serial measurements - CXR with no acute cardiopulmonary disease  - Continue statin and beta-blocker  - Place 1" nitropaste; morphine for pain not relieved by NTG - Monitor on telemetry in stepdown unit given active CP  - Check echocardiography   2. CAD  - Cath in April 2016 demonstrated 85% occlusion of mid-LAD and DES was placed  - Lt and RHC in February 2017 featured 85% occlusion of LCx, widely patent LAD stent, and moderate/non-critical RCA disease - CABG was considered after the most recent cath, but decision was made to attempt medical management first and reconsider PCI or CABG if he fails  - Until now, had  been pain-free on Ranexa and Imdur since the cath in February - Continue Effient, ASA 81, Lopressor, Imdur, Ranexa, fenofibrate,  Pravachol  3. Hypertension - At goal at time of admission  - Continue current management with Norvasc, Lopressor  4. Hyperlipidemia  - LDL 115, HDL 34 in February 2017  - Continue current mangement with Pravachol and fenofibrate   5. Type II DM - A1c 9.0% in March 2017, reflecting poor control  - Followed by endocrinology in the outpatient setting and managed with Novolin 70/30 40 units qAM and 60 units qPM, and Novolin 20-30 units TID with meals - Check CBG with meals and qHS  - Basal coverage with Lantus 37 units BID; mealtime coverage with novolog 4 units TID; moderate-intensity SSI, adjust prn   6. GERD - Managed with Nexium at home, will continue PPI with Protonix while in hospital    7. Essential tremor   - Stable, continue current management with Inderal   8. Hypokalemia  - Serum potassium 3.4 on admission, likely secondary to diuretics  - 40 mEq oral potassium given in ED  - Check mag level and replete prn  - Repeat chem panel in am   9. Chronic diastolic CHF  - Appears stable, well-compensated  - TTE (12/18/15) with EF 94-47%, grade 1 diastolic dysfunction, and no significant valvular disease  - Continue current management with Lopressor, Lasix, K-Dur  - Check daily-wts and follow I/O's    DVT prophylaxis: sq heparin  Code Status: Full  Family Communication: Discussed with patient Disposition Plan: Observe in stepdown  Consults called: None  Admission status: Observation    Vianne Bulls, MD [facility] Pager [phone]  If 7PM-7AM, please contact night-coverage [email or web address] Password [email or web address]  04/19/2016, 10:15 PM

## 2016-04-19 NOTE — ED Provider Notes (Signed)
CSN: [id number]     Arrival date & time 04/19/16  1849 History   First MD Initiated Contact with Patient 04/19/16 1933     Chief Complaint  Patient presents with  . Chest Pain   HPI Pt to ER BIB [facility] from home with complaint of left anterior chest pain without radiation onset while having bowel movement about 1 hour ago. Pt reports sudden onset of chest pain with nausea, lasted 1h or so, with dizziness, and near syncopal. Pt took 2 nitro at home with minimal relief, was given 4 mg zofran in route, 324 mg aspirin, and 1 nitro by EMS. Nitro did eventually help some. Pt rates pain 6/10 on pain. Hx CAD, DM. Also history of chronic diastolic CHF. Pt still endorses ome cp, sharp, tight, radiates to arms, nitro helped. Does not feel like prior mi. Did feel sob. Not exertional. No fevers, no chills. On and off since lightheadedness episode. No vomit. Did have some abdominal pain in RLQ, no diarrhea but is constipated, has appendix, no dysuria, no hematuria. Pt had stress test in 06/2015 which was negative. Pt also endorses some abdominal pain in RLQ, mild, no particular trigger, thought maybe related to his mattress   Recent echo 12/2015: Study Conclusions - Left ventricle: The cavity size was normal. There was moderate  concentric hypertrophy. Systolic function was normal. The  estimated ejection fraction was in the range of 60% to 65%. Wall  motion was normal; there were no regional wall motion  abnormalities. There was an increased relative contribution of  atrial contraction to ventricular filling. Doppler parameters are  consistent with abnormal left ventricular relaxation (grade 1  diastolic dysfunction). - Mitral valve: There was trivial regurgitation.  Past Medical History  Diagnosis Date  . Hypertension   . CAD (coronary artery disease)     a. 01/2015 DES to LAD  b. 12/2015: [place] 85% oLCx lesion--> rx therapy.  Marland Kitchen TIA (transient ischemic attack) 2002  . Dyslipidemia, goal LDL below 70  01/29/2014  . GERD (gastroesophageal reflux disease)   . Enlarged prostate   . DM type 2 (diabetes mellitus, type 2), insulin dependent 01/29/2014    fasting cbg 50-120 with new regimen  . Sleep apnea     does not use cpap (06/04/2015)  . Essential tremor     a. on proprnolol  . Adenomatous colon polyp 04/2011  . Internal hemorrhoid   . Chronic diastolic CHF (congestive heart failure) [facility])    Past Surgical History  Procedure Laterality Date  . Total knee arthroplasty Bilateral   . Lumbar disc surgery    . Cataract extraction Bilateral   . Joint replacement    . Back surgery    . Cataract extraction, bilateral Bilateral   . Shoulder arthroscopy Right 07/30/2014    Procedure: Right Shoulder Arthroscopy, Debridement, Decompression, Manipulation Under Anesthesia;  Surgeon: Newt Minion, MD;  Location: [place];  Service: Orthopedics;  Laterality: Right;  . Left heart catheterization with coronary angiogram N/A 01/28/2014    Procedure: LEFT HEART CATHETERIZATION WITH CORONARY ANGIOGRAM;  Surgeon: Sinclair Grooms, MD;  Location: [facility] CATH LAB;  Service: Cardiovascular;  Laterality: N/A;  . Left heart catheterization with coronary angiogram N/A 01/30/2015    Procedure: LEFT HEART CATHETERIZATION WITH CORONARY ANGIOGRAM;  Surgeon: Belva Crome, MD;  Location: [facility] CATH LAB;  Service: Cardiovascular;  Laterality: N/A;  . Cardiac catheterization  01/28/14    + CAD treat medically  . Coronary angioplasty with stent placement  01/30/2015    DES Promus  Premier to LAD by Dr Tamala Julian  . Cardiac catheterization N/A 12/21/2015    Procedure: Right/Left Heart Cath and Coronary Angiography;  Surgeon: Belva Crome, MD;  Location: [place] CV LAB;  Service: Cardiovascular;  Laterality: N/A;   Family History  Problem Relation Age of Onset  . CAD Brother     2 brothers - CABG  . Alzheimer's disease Sister   . CAD Sister   . Prostate cancer Brother   . Diabetes Other     entire family  . Asthma Brother     2  brothers    Social History  Substance Use Topics  . Smoking status: Never Smoker   . Smokeless tobacco: Never Used  . Alcohol Use: No    Review of Systems  Constitutional: Negative for fever.  Allergic/Immunologic: Negative for immunocompromised state.  All other systems reviewed and are negative.     Allergies  Review of patient's allergies indicates no known allergies.  Home Medications   Prior to Admission medications   Medication Sig Start Date End Date Taking? Authorizing Provider  amLODipine (NORVASC) 10 MG tablet take 1 tablet by mouth once daily 01/04/16  Yes Yvonne R Lowne Chase, DO  aspirin EC 81 MG tablet Take 81 mg by mouth daily.   Yes Historical Provider, MD  B-D INS SYR ULTRAFINE 1CC/31G 31G X 5/16" 1 ML MISC inject three times a day AS INSTRUCTED 12/16/15  Yes Historical Provider, MD  BD INSULIN SYRINGE U-500 31G X 6MM 0.5 ML MISC  12/01/15  Yes Historical Provider, MD  Ciclopirox 1 % shampoo Apply 1 application topically at bedtime. At night after showering 11/20/14  Yes Historical Provider, MD  clonazePAM (KLONOPIN) 1 MG tablet Take 1 tablet (1 mg total) by mouth at bedtime as needed for anxiety. 03/01/16  Yes Yvonne R Lowne Chase, DO  dextromethorphan-guaiFENesin (MUCINEX DM) 30-600 MG 12hr tablet Take 1 tablet by mouth 2 (two) times daily. Patient taking differently: Take 1 tablet by mouth 2 (two) times daily as needed.  11/01/15  Yes Debbe Odea, MD  diclofenac sodium (VOLTAREN) 1 % GEL Apply 2 g topically 4 (four) times daily.   Yes Historical Provider, MD  esomeprazole (NEXIUM) 40 MG capsule Take 1 capsule (40 mg total) by mouth daily at 12 noon. 01/01/16  Yes Yvonne R Lowne Chase, DO  fenofibrate 160 MG tablet Take 1 tablet (160 mg total) by mouth daily. 01/01/16  Yes Yvonne R Lowne Chase, DO  fluticasone furoate-vilanterol (BREO ELLIPTA) 100-25 MCG/INH AEPB Inhale 1 puff into the lungs daily. 01/01/16  Yes Yvonne R Lowne Chase, DO  furosemide (LASIX) 40 MG tablet Take 1  tablet (40 mg total) by mouth daily. 01/01/16  Yes Alferd Apa Lowne Chase, DO  glucose blood test strip Use to test blood sugar 4 times daily instructed. Dx: E11.59 11/06/15  Yes Philemon Kingdom, MD  insulin NPH-regular Human (NOVOLIN 70/30) (70-30) 100 UNIT/ML injection Inject under skin 40 units in the morning and 60 units before dinner 01/18/16  Yes Philemon Kingdom, MD  insulin regular (NOVOLIN R) 100 units/mL injection Inject 0.2-0.3 mLs (20-30 Units total) into the skin 3 (three) times daily before meals. 02/26/16  Yes Philemon Kingdom, MD  Insulin Syringes, Disposable, U-100 1 ML MISC Use 4x a day 08/20/15  Yes Philemon Kingdom, MD  isosorbide mononitrate (IMDUR) 60 MG 24 hr tablet Take 1 tablet (60 mg total) by mouth daily. 01/01/16  Yes Rosalita Chessman  Chase, DO  metoprolol (LOPRESSOR) 50 MG tablet Take 1 tablet (50 mg total) by mouth 2 (two) times daily. 01/01/16  Yes Yvonne R Lowne Chase, DO  nitroGLYCERIN (NITROSTAT) 0.4 MG SL tablet Place 1 tablet (0.4 mg total) under the tongue every 5 (five) minutes as needed for chest pain (CP or SOB). 02/08/16  Yes Luke K Kilroy, PA-C  pantoprazole (PROTONIX) 40 MG tablet Take 40 mg by mouth daily. 01/04/16  Yes Historical Provider, MD  Polyvinyl Alcohol-Povidone (REFRESH OP) Place 1 drop into both eyes daily as needed (for dry eyes). Reported on 12/25/2015   Yes Historical Provider, MD  potassium chloride SA (K-DUR,KLOR-CON) 20 MEQ tablet Take 1 tablet (20 mEq total) by mouth daily. 01/01/16  Yes Yvonne R Lowne Chase, DO  prasugrel (EFFIENT) 10 MG TABS tablet Take 1 tablet (10 mg total) by mouth daily. 01/01/16  Yes Yvonne R Lowne Chase, DO  propranolol (INDERAL) 40 MG tablet Take 1 tablet (40 mg total) by mouth 2 (two) times daily. 01/04/16  Yes Donika Keith Rake, DO  ranolazine (RANEXA) 1000 MG SR tablet Take 1 tablet (1,000 mg total) by mouth 2 (two) times daily. 03/21/16  Yes Belva Crome, MD  VENTOLIN HFA 108 (90 BASE) MCG/ACT inhaler Inhale 2 puffs into the lungs every  6 (six) hours as needed. (SHORTNESS OR BREATH / WHEEZING) 06/22/15  Yes Historical Provider, MD  Blood Glucose Monitoring Suppl (ONE TOUCH ULTRA SYSTEM KIT) W/DEVICE KIT Use to test blood sugar daily as instructed. Dx code: E11.59 12/30/14   Philemon Kingdom, MD  dicyclomine (BENTYL) 10 MG capsule Take 1 capsule (10 mg total) by mouth 3 (three) times daily before meals. Patient not taking: Reported on 04/19/2016 01/01/16   Rosalita Chessman Chase, DO  pravastatin (PRAVACHOL) 40 MG tablet Take 1 tablet (40 mg total) by mouth daily. 01/01/16   Yvonne R Lowne Chase, DO   BP 125/61 mmHg  Pulse 58  Temp(Src) 97.9 F (36.6 C) (Oral)  Resp 15  Ht '5\' 8"'  (1.727 m)  Wt 123.378 kg  BMI 41.37 kg/m2  SpO2 99% Physical Exam  Constitutional: He appears well-developed and well-nourished. No distress.  HENT:  Head: Normocephalic and atraumatic.  Left Ear: External ear normal.  Eyes: Conjunctivae are normal. Pupils are equal, round, and reactive to light. Right eye exhibits no discharge. Left eye exhibits no discharge.  Neck: Normal range of motion. Neck supple.  Cardiovascular: Normal rate and regular rhythm.   No murmur heard. Pulmonary/Chest: Effort normal. No respiratory distress. He has rales (bilateral lobes lower).  Abdominal: Soft. Bowel sounds are normal. He exhibits no distension and no mass. There is tenderness (RLQ, mild). There is no rebound and no guarding.  No bulging abdominal mass, obese  Musculoskeletal: He exhibits no edema.  Neurological: He is alert.  Skin: Skin is warm. He is not diaphoretic.  Psychiatric: He has a normal mood and affect.    ED Course  Procedures (including critical care time) Labs Review Labs Reviewed  CBC WITH DIFFERENTIAL/PLATELET - Abnormal; Notable for the following:    Hemoglobin 12.0 (*)    HCT 36.2 (*)    All other components within normal limits  COMPREHENSIVE METABOLIC PANEL - Abnormal; Notable for the following:    Potassium 3.4 (*)    Total Protein 5.8  (*)    Albumin 3.1 (*)    ALT 14 (*)    All other components within normal limits  LIPASE, BLOOD  URINALYSIS, DIPSTICK ONLY  CBG  MONITORING, ED  I-STAT TROPOININ, ED    Imaging Review No results found. I have personally reviewed and evaluated these images and lab results as part of my medical decision-making.   EKG Interpretation   Date/Time:  Tuesday April 19 2016 18:56:18 EDT Ventricular Rate:  54 PR Interval:    QRS Duration: 98 QT Interval:  437 QTC Calculation: 415 R Axis:   64 Text Interpretation:  Sinus rhythm Prolonged PR interval Baseline wander  in lead(s) I V1 V3 Poor data quality Confirmed by Ashok Cordia  MD, Lennette Bihari  ([id number]) on 04/19/2016 7:20:21 PM      MDM   Final diagnoses:  Lightheadedness   Labs reviewed and unremarkable. Troponin is negative. Potassium replaced. Given presyncopal episode in a patient with significant coronary artery disease, requires telemetry observation and further evaluation of his chest pain with troponin monitoring. Patient states he is still having some minor chest pain with the nitroglycerin is no longer helping. He is no longer feeling lightheaded. Historically sounds like it may be secondary to no food intake in approximately 12 hours as well as straining, however, in setting of multiple risk factors, requires observation. Patient also noted some mild right lower quadrant abdominal tenderness that has been present for days him also constipated. Abdomen is benign. Serial abdominal exams indicated. Will hold from CT of the abdomen and pelvis. Will admit patient.    Karma Greaser, MD 04/19/16 0518  Lajean Saver, MD 04/19/16 [phone]

## 2016-04-19 NOTE — ED Notes (Signed)
Pt to ER BIB [facility] from home with complaint of left anterior chest pain without radiation onset while having bowel movement about 1 hour ago. Pt reports sudden onset of chest pain with nausea, dizziness, and near syncopal. Pt took 2 nitro at home with minimal relief, was given 4 mg zofran in route, 324 mg aspirin, and 1 nitro by EMS. Pt is a/o x4. Pt rates pain 6/10 on pain.

## 2016-04-19 NOTE — ED Notes (Signed)
Report attempted, RN out of the floor for a moment, CN not available to get report at this time, we'll call back in 10 min.

## 2016-04-20 ENCOUNTER — Observation Stay (HOSPITAL_COMMUNITY): Payer: Medicare Other

## 2016-04-20 ENCOUNTER — Other Ambulatory Visit: Payer: Medicare Other

## 2016-04-20 ENCOUNTER — Observation Stay (HOSPITAL_BASED_OUTPATIENT_CLINIC_OR_DEPARTMENT_OTHER): Payer: Medicare Other

## 2016-04-20 DIAGNOSIS — R079 Chest pain, unspecified: Secondary | ICD-10-CM

## 2016-04-20 DIAGNOSIS — G250 Essential tremor: Secondary | ICD-10-CM

## 2016-04-20 DIAGNOSIS — R42 Dizziness and giddiness: Secondary | ICD-10-CM | POA: Diagnosis not present

## 2016-04-20 DIAGNOSIS — R0789 Other chest pain: Secondary | ICD-10-CM

## 2016-04-20 DIAGNOSIS — N200 Calculus of kidney: Secondary | ICD-10-CM | POA: Diagnosis not present

## 2016-04-20 DIAGNOSIS — E876 Hypokalemia: Secondary | ICD-10-CM

## 2016-04-20 DIAGNOSIS — I10 Essential (primary) hypertension: Secondary | ICD-10-CM | POA: Diagnosis not present

## 2016-04-20 LAB — ECHOCARDIOGRAM COMPLETE
E decel time: 324 msec
E/e' ratio: 11.52
FS: 34 % (ref 28–44)
Height: 67 in
IVS/LV PW RATIO, ED: 0.91
LA ID, A-P, ES: 39 mm
LA diam end sys: 39 mm
LA diam index: 1.59 cm/m2
LAVOLA4C: 42.5 mL
LDCA: 2.54 cm2
LV E/e'average: 11.52
LV TDI E'LATERAL: 8.38
LV TDI E'MEDIAL: 6.53
LV e' LATERAL: 8.38 cm/s
LVEEMED: 11.52
LVOTD: 18 mm
MV Dec: 324
MV pk A vel: 112 m/s
MV pk E vel: 96.5 m/s
MVPG: 4 mmHg
PW: 15.2 mm — AB (ref 0.6–1.1)
Weight: 4251.2 oz

## 2016-04-20 LAB — GLUCOSE, CAPILLARY
GLUCOSE-CAPILLARY: 383 mg/dL — AB (ref 65–99)
GLUCOSE-CAPILLARY: 262 mg/dL — AB (ref 65–99)
Glucose-Capillary: 161 mg/dL — ABNORMAL HIGH (ref 65–99)
Glucose-Capillary: 237 mg/dL — ABNORMAL HIGH (ref 65–99)
Glucose-Capillary: 262 mg/dL — ABNORMAL HIGH (ref 65–99)

## 2016-04-20 LAB — LIPID PANEL
Cholesterol: 159 mg/dL (ref 0–200)
HDL: 31 mg/dL — AB (ref >40)
LDL Cholesterol: 107 mg/dL — ABNORMAL HIGH (ref 0–99)
TRIGLYCERIDES: 106 mg/dL (ref <150)
Total CHOL/HDL Ratio: 5.1 RATIO
VLDL: 21 mg/dL (ref 0–40)

## 2016-04-20 LAB — TROPONIN I

## 2016-04-20 LAB — BRAIN NATRIURETIC PEPTIDE: B NATRIURETIC PEPTIDE 5: 24.7 pg/mL (ref 0.0–100.0)

## 2016-04-20 MED ORDER — PERFLUTREN LIPID MICROSPHERE
INTRAVENOUS | Status: AC
  Administered 2016-04-20: 3 mL
  Filled 2016-04-20: qty 10

## 2016-04-20 MED ORDER — MAGNESIUM HYDROXIDE 400 MG/5ML PO SUSP
30.0000 mL | Freq: Once | ORAL | Status: AC
  Administered 2016-04-20: 30 mL via ORAL
  Filled 2016-04-20: qty 30

## 2016-04-20 NOTE — Progress Notes (Signed)
Patient ID: Edward [name], male   DOB: [birth year], 75 y.o.   MRN: [id number]    PROGRESS NOTE    Edward [name]  [id number] DOB: 05-Feb-1941 DOA: 04/19/2016  PCP: Ann Held, DO   Brief Narrative:  75 male patient with known hypertension, hyperlipidemia, insulin-dependent diabetes mellitus, GERD, chronic diastolic CHF, and coronary artery disease with DES to LAD (2015), presented for evaluation of sudden onset dizziness and lightheaded ness exacerbated with straining and associated with intermittent episodes of chest discomfort. Pt also reports right flank pain associated with nausea.   Assessment & Plan:  1. Chest pain, dizziness and lightheadedness  - initial EKG with no acute ischemic features, CE so far negative  - CXR with no acute cardiopulmonary disease  - cardiology consulted for assistance  - keep on tele for now and attempt to ambulate to see how pt does  - PT evaluation requested   2. CAD  - Cath in April 2016 demonstrated 85% occlusion of mid-LAD and DES was placed  - Lt and RHC in February 2017 featured 85% occlusion of LCx, widely patent LAD stent, mod/non-critical RCA dz - CABG considered after most recent cath, decision was made to attempt medical mngt first and reconsider PCI or CABG if he fails  - follow up on cardiology team recommendations   3. Hypertension, essential   - reasonable inpatient control  - Continue current management with Norvasc, Lopressor, Imdur  4. Hyperlipidemia  - LDL 115, HDL 34 in February 2017  - Continue current mangement with Pravachol and fenofibrate   5. Type II DM with long term insulin use  - A1c 9.0% in March 2017, reflecting poor control  - Followed by endocrinology in the outpatient setting and managed with Novolin 70/30 40 units qAM and 60 units qPM, and Novolin 20-30 units TID with meals - Basal coverage with Lantus 37 units BID; mealtime coverage with novolog 4 units TID; moderate-intensity SSI, adjust prn    6. GERD - Managed with Nexium at home, continue Protonix while in hospital   7. Essential tremor  - Stable, continue current management with Inderal   8. Hypokalemia  - supplement and repeat BMP in AM  9. Chronic diastolic CHF  - Appears stable, well-compensated  - TTE (12/18/15) with EF [id number], grade 1 diastolic dysfunction, and no significant valvular disease  - Continue current management with Lopressor, Lasix - weight 120 kg this AM, monitor daily weights, strict I/O  10. Morbid obesity due to excess calories  - Body mass index is 41.6 kg/(m^2).   11. Right flank pain - renal US pending, pt reported hx of renal stones   DVT prophylaxis: Heparin SQ Code Status: Full  Family Communication: Patient at bedside  Disposition Plan: Home in AM  Consultants:   Cardiology   Procedures:   None  Antimicrobials:  None   Subjective: Pt reports feeling better but still with intermittent dizziness.   Objective: Filed Vitals:   04/19/16 2344 04/20/16 0445 04/20/16 1000 04/20/16 1136  BP: 146/66 147/72 151/54 141/51  Pulse: 57 55 61 56  Temp: 98 F (36.7 C) 97.7 F (36.5 C) 98.5 F (36.9 C) 97.7 F (36.5 C)  TempSrc: Oral Oral Oral Oral  Resp:  18  18  Height: 5\' 7"  (1.702 m)     Weight: 121.11 kg (267 lb) 120.521 kg (265 lb 11.2 oz)    SpO2: 100% 100% 100% 98%    Intake/Output Summary (Last 24 hours) at 04/20/16 1530  Last data filed at 04/20/16 1443  Gross per 24 hour  Intake    720 ml  Output   1225 ml  Net   -505 ml   Filed Weights   04/19/16 1857 04/19/16 2344 04/20/16 0445  Weight: 123.378 kg (272 lb) 121.11 kg (267 lb) 120.521 kg (265 lb 11.2 oz)    Examination:  General exam: Appears calm and comfortable  Respiratory system: Clear to auscultation. Respiratory effort normal. Cardiovascular system: S1 & S2 heard, bradycardia. No JVD,  rubs, gallops or clicks. No pedal edema. Gastrointestinal system: Abdomen is nondistended, soft and  nontender.  Central nervous system: Alert and oriented. No focal neurological deficits. Extremities: Symmetric 5 x 5 power. Skin: No rashes, lesions or ulcers Psychiatry: Judgement and insight appear normal. Mood & affect appropriate.    Data Reviewed: I have personally reviewed following labs and imaging studies  CBC:  Recent Labs Lab 04/19/16 2007  WBC 7.2  NEUTROABS 4.3  HGB 12.0*  HCT 36.2*  MCV 84.2  PLT [id number]   Basic Metabolic Panel:  Recent Labs Lab 04/19/16 2008 04/19/16 2228  NA 138  --   K 3.4*  --   CL 106  --   CO2 27  --   GLUCOSE 77  --   BUN 11  --   CREATININE 1.16  --   CALCIUM 8.9  --   MG  --  1.8   Liver Function Tests:  Recent Labs Lab 04/19/16 2008  AST 17  ALT 14*  ALKPHOS 83  BILITOT 0.4  PROT 5.8*  ALBUMIN 3.1*    Recent Labs Lab 04/19/16 2008  LIPASE 17   Cardiac Enzymes:  Recent Labs Lab 04/19/16 2228 04/20/16 1009  TROPONINI <0.03 <0.03   BNP (last 3 results)  Recent Labs  07/17/15 1139 07/24/15 0841  PROBNP 35.0 24.0   CBG:  Recent Labs Lab 04/19/16 1912 04/19/16 1951 04/19/16 2356 04/20/16 0824 04/20/16 1132  GLUCAP 75 72 161* 262* 383*   Lipid Profile:  Recent Labs  04/20/16 0329  CHOL 159  HDL 31*  LDLCALC 107*  TRIG 106  CHOLHDL 5.1   Urine analysis:    Component Value Date/Time   COLORURINE YELLOW 04/19/2016 2203   APPEARANCEUR CLOUDY* 04/19/2016 2203   LABSPEC 1.010 04/19/2016 2203   PHURINE 6.5 04/19/2016 2203   GLUCOSEU NEGATIVE 04/19/2016 2203   HGBUR MODERATE* 04/19/2016 2203   BILIRUBINUR NEGATIVE 04/19/2016 2203   BILIRUBINUR neg 07/17/2014 1523   KETONESUR NEGATIVE 04/19/2016 2203   PROTEINUR NEGATIVE 04/19/2016 2203   PROTEINUR 1 07/17/2014 1523   UROBILINOGEN 0.2 07/17/2014 1523   NITRITE NEGATIVE 04/19/2016 2203   NITRITE neg 07/17/2014 1523   LEUKOCYTESUR TRACE* 04/19/2016 2203   Radiology Studies: Dg Chest 2 View 04/19/2016  No active cardiopulmonary  disease.  Scheduled Meds: . amLODipine  10 mg Oral Daily  . aspirin EC  81 mg Oral Daily  . docusate sodium  100 mg Oral BID  . fenofibrate  160 mg Oral Daily  . fluticasone furoate-vilanterol  1 puff Inhalation Daily  . furosemide  40 mg Oral Daily  . heparin  5,000 Units Subcutaneous Q8H  . insulin aspart  0-15 Units Subcutaneous TID WC  . insulin aspart  4 Units Subcutaneous TID WC  . insulin glargine  37 Units Subcutaneous BID  . isosorbide mononitrate  60 mg Oral Daily  . metoprolol  50 mg Oral BID  . nitroGLYCERIN  0.5 inch Topical Q6H  . pantoprazole  40 mg Oral Daily  . polyvinyl alcohol  2 drop Both Eyes BID  . potassium chloride SA  20 mEq Oral Daily  . prasugrel  10 mg Oral Daily  . pravastatin  40 mg Oral q1800  . propranolol  40 mg Oral BID  . ranolazine  500 mg Oral BID  . sodium chloride flush  3 mL Intravenous Q12H   Continuous Infusions:  Time spent: 20 minutes   Faye Ramsay, MD [facility] Pager [phone]  If 7PM-7AM, please contact night-coverage [email or web address] Password [email or web address] 04/20/2016, 3:30 PM

## 2016-04-20 NOTE — Progress Notes (Signed)
Echocardiogram 2D Echocardiogram with Definity has been performed.  Edward [name] 04/20/2016, 2:08 PM

## 2016-04-20 NOTE — Consult Note (Signed)
Cardiology Consult    Patient ID: Edward [name] MRN: [id number], DOB/AGE: 1941-08-02   Admit date: 04/19/2016 Date of Consult: 04/20/2016  Primary Physician: Edward Held, DO Primary Cardiologist: Dr. Tamala [name] Requesting Provider: Dr. Myna [name]  Patient Profile    75 yo male with PMH of hypertension, hyperlipidemia, insulin-dependent diabetes mellitus, GERD, chronic diastolic CHF, and coronary artery disease with DES to LAD (2015) who presented to the [facility] ED with chest pain on 04/19/2016.  Past Medical History   Past Medical History  Diagnosis Date  . Hypertension   . CAD (coronary artery disease)     a. 01/2015 DES to LAD  b. 12/2015: [place] 85% oLCx lesion--> rx therapy.  [facility] Kitchen TIA (transient ischemic attack) 2002  . Dyslipidemia, goal LDL below 70 01/29/2014  . GERD (gastroesophageal reflux disease)   . Enlarged prostate   . DM type 2 (diabetes mellitus, type 2), insulin dependent 01/29/2014    fasting cbg 50-120 with new regimen  . Sleep apnea     does not use cpap (06/04/2015)  . Essential tremor     a. on proprnolol  . Adenomatous colon polyp 04/2011  . Internal hemorrhoid   . Chronic diastolic CHF (congestive heart failure) [facility])     Past Surgical History  Procedure Laterality Date  . Total knee arthroplasty Bilateral   . Lumbar disc surgery    . Cataract extraction Bilateral   . Joint replacement    . Back surgery    . Cataract extraction, bilateral Bilateral   . Shoulder arthroscopy Right 07/30/2014    Procedure: Right Shoulder Arthroscopy, Debridement, Decompression, Manipulation Under Anesthesia;  Surgeon: Newt Minion, MD;  Location: [place];  Service: Orthopedics;  Laterality: Right;  . Left heart catheterization with coronary angiogram N/A 01/28/2014    Procedure: LEFT HEART CATHETERIZATION WITH CORONARY ANGIOGRAM;  Surgeon: Sinclair Grooms, MD;  Location: [facility] CATH LAB;  Service: Cardiovascular;  Laterality: N/A;  . Left heart catheterization with coronary  angiogram N/A 01/30/2015    Procedure: LEFT HEART CATHETERIZATION WITH CORONARY ANGIOGRAM;  Surgeon: Belva Crome, MD;  Location: [facility] CATH LAB;  Service: Cardiovascular;  Laterality: N/A;  . Cardiac catheterization  01/28/14    + CAD treat medically  . Coronary angioplasty with stent placement  01/30/2015    DES Promus  Premier to LAD by Dr Edward [name]  . Cardiac catheterization N/A 12/21/2015    Procedure: Right/Left Heart Cath and Coronary Angiography;  Surgeon: Belva Crome, MD;  Location: [place] CV LAB;  Service: Cardiovascular;  Laterality: N/A;     Allergies  No Known Allergies  History of Present Illness    Edward [name] is a 75 male patient of Dr. Tamala [name] with PMH of hypertension, hyperlipidemia, insulin-dependent diabetes mellitus, GERD, chronic diastolic CHF, and coronary artery disease with DES to LAD (2015). He was last seen in the office by Edward [name] on 01/27/2016 following a recent admission back in February for dyspnea and chest pain that was worrisome for unstable angina. At that time he underwent a left and right heart cath on 12/21/2015 which showed 40-50% L main with cross sectional area by IVUS 5.96 mm at most severe (borderline), 85% ostial LCx (culprit lesion), widely patent mLAD stent, mod non critical disease in RCA, and acute on chronic D/CHF with markedly elevated filling pressures (LVEDP 30/PCWP 24) as well as mod pulm HTN. CT surgery was consulted for CABG versus stenting of the left circumflex back to the left  main, they felt that CABG would probably result in pain relief but no change with survival benefit. This was discussed with Dr. Tamala [name] who felt it was appropriate to control symptoms with medical therapy, with the plan to proceed with further intervention if he fails optimal medical therapy. He was started on indoor which was titrated up to 60 mg, continued to have chest pain and Ranexa 1000 mg twice a day was added. In the clinic following this hospitalization he  reported doing much better, and was given samples for Ranexa given he cannot afford the prescription. His last 2-D echo on 217/2017 showed an EF of 60-65% with no wall motion abnormality, and grade 1 diastolic dysfunction along with trivial mitral regurg. States he has been doing well since his last office visit.  Yesterday he reports having some bouts recently with constipation, stating he had taken his afternoon medications and proceeded to have a bowl movement. Reports while sitting on the toilet he began to strain and experienced light-headed, dizziness and then developed left anterior chest pain which radiated into both arms. States he took 2 sublingual nitroglycerin with some relief, but became concerned whenever the pain persisted. States this chest pain/pressure lasted about an hour and he called for transport to the ER for further evaluation. In route he was given 324 of aspirin along with the third nitroglycerin with improvement of his pain radiating in a 6 out of 10 on arrival to the ER. Of note he reports being on 2 beta blockers at home including propanolol and Lopressor. States his neurologist placed him on the propanolol given his history of tremors. He denies having had any shortness of breath, didn't have diaphoresis, nausea, and palpitations.  In the ED his labs showed a creatinine of 1.16, hemoglobin of 12, BNP 24, point of care troponin negative, repeat troponin cycled and negative 2, and potassium 3.4. Chest x-ray was negative for active cardiopulmonary process. EKG shows sinus rhythm with known first-degree A-V block, no acute ST/T-wave changes. He reports receiving Nitropaste along with 4 mg of morphine which helped alleviate his pain. Of note he also reports right lower quadrant abdominal pain with radiation into the back that has been persistent for the past couple of days. He did receive a dose of morphine for this abdominal pain prior to my assessment. Does report having another  episode of left-sided chest pain earlier this morning while sitting in bed. He was admitted to internal medicine service for further workup.  Cardiology has been consult and in relation to patient's report of chest pain.  Inpatient Medications    . amLODipine  10 mg Oral Daily  . aspirin EC  81 mg Oral Daily  . docusate sodium  100 mg Oral BID  . fenofibrate  160 mg Oral Daily  . fluticasone furoate-vilanterol  1 puff Inhalation Daily  . furosemide  40 mg Oral Daily  . heparin  5,000 Units Subcutaneous Q8H  . insulin aspart  0-15 Units Subcutaneous TID WC  . insulin aspart  4 Units Subcutaneous TID WC  . insulin glargine  37 Units Subcutaneous BID  . isosorbide mononitrate  60 mg Oral Daily  . metoprolol  50 mg Oral BID  . nitroGLYCERIN  0.5 inch Topical Q6H  . pantoprazole  40 mg Oral Daily  . polyvinyl alcohol  2 drop Both Eyes BID  . potassium chloride SA  20 mEq Oral Daily  . prasugrel  10 mg Oral Daily  . pravastatin  40 mg Oral  q1800  . propranolol  40 mg Oral BID  . ranolazine  500 mg Oral BID  . sodium chloride flush  3 mL Intravenous Q12H    Family History    Family History  Problem Relation Age of Onset  . CAD Brother     2 brothers - CABG  . Alzheimer's disease Sister   . CAD Sister   . Prostate cancer Brother   . Diabetes Other     entire family  . Asthma Brother     2 brothers     Social History    Social History   Social History  . Marital Status: Married    Spouse Name: N/A  . Number of Children: 0  . Years of Education: N/A   Occupational History  . retired    Social History Main Topics  . Smoking status: Never Smoker   . Smokeless tobacco: Never Used  . Alcohol Use: No  . Drug Use: No  . Sexual Activity: No   Other Topics Concern  . 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   He lives alone.  His wife is living in [place] at this time.   They do not have children.   Highest level of education:  College graduate     Review of Systems    General:  No chills, fever, night sweats or weight changes.  Cardiovascular: See HPI Dermatological: No rash, lesions/masses Respiratory: No cough, dyspnea Urologic: No hematuria, dysuria Abdominal:   See HPI Neurologic:  No visual changes, wkns, changes in mental status. All other systems reviewed and are otherwise negative except as noted above.  Physical Exam    Blood pressure 141/51, pulse 56, temperature 97.7 F (36.5 C), temperature source Oral, resp. rate 18, height 5\' 7"  (1.702 m), weight 265 lb 11.2 oz (120.521 kg), SpO2 98 %.  General: Pleasant obese male, NAD Neuro: Alert and oriented X 3. Moves all extremities spontaneously. HEENT: Normal Lungs:  Resp regular and unlabored, diminished bilaterally in lower lobes. Heart: RRR, bradycardic, no s3, s4, or murmurs. Abdomen: Soft, obese, non-tender, non-distended, BS + x 4.  Extremities: No clubbing, cyanosis 1+  L>R LE edema. DP/PT/Radials 2+ and equal bilaterally.  Labs    Troponin [facility] of Care Test)  Recent Labs  04/19/16 1959  TROPIPOC 0.00    Recent Labs  04/19/16 2228 04/20/16 1009  TROPONINI <0.03 <0.03   Lab Results  Component Value Date   WBC 7.2 04/19/2016   HGB 12.0* 04/19/2016   HCT 36.2* 04/19/2016   MCV 84.2 04/19/2016   PLT 220 04/19/2016    Recent Labs Lab 04/19/16 2008  NA 138  K 3.4*  CL 106  CO2 27  BUN 11  CREATININE 1.16  CALCIUM 8.9  PROT 5.8*  BILITOT 0.4  ALKPHOS 83  ALT 14*  AST 17  GLUCOSE 77   Lab Results  Component Value Date   CHOL 159 04/20/2016   HDL 31* 04/20/2016   LDLCALC 107* 04/20/2016   TRIG 106 04/20/2016   Lab Results  Component Value Date   DDIMER <0.27 06/05/2015     Radiology Studies    Dg Chest 2 View  04/19/2016  CLINICAL DATA:  75 year old male with chest pain EXAM: CHEST  2 VIEW COMPARISON:  Chest radiograph dated 12/18/2015 FINDINGS:  There is shallow inspiration. There bibasilar subsegmental atelectatic changes  of the lungs. There is no focal consolidation, pleural effusion, or pneumothorax. Stable mild cardiomegaly. Coronary vascular stents noted. No acute osseous pathology. IMPRESSION: No active cardiopulmonary disease. Electronically Signed   By: Anner Crete M.D.   On: 04/19/2016 22:07    ECG & Cardiac Imaging    EKG: SB first degree AV block  ECHO: 12/2015  Study Conclusions  - Left ventricle: The cavity size was normal. There was moderate  concentric hypertrophy. Systolic function was normal. The  estimated ejection fraction was in the [name] of 60% to 65%. Wall  motion was normal; there were no regional wall motion  abnormalities. There was an increased relative contribution of  atrial contraction to ventricular filling. Doppler parameters are  consistent with abnormal left ventricular relaxation (grade 1  diastolic dysfunction). - Mitral valve: There was trivial regurgitation.   Assessment & Plan    Mr. Sauder is a 76 male patient of Dr. Tamala [name] with PMH of hypertension, hyperlipidemia, insulin-dependent diabetes mellitus, GERD, chronic diastolic CHF, and coronary artery disease with DES to LAD (2015). He was last seen in the office by Edward [name] on 01/27/2016 following a recent admission back in February for dyspnea and chest pain that was worrisome for unstable angina. At that time he underwent a left and right heart cath on 12/21/2015 which showed 40-50% L main with cross sectional area by IVUS 5.96 mm at most severe (borderline), 85% ostial LCx (culprit lesion), widely patent mLAD stent, mod non critical disease in RCA, and acute on chronic D/CHF with markedly elevated filling pressures (LVEDP 30/PCWP 24) as well as mod pulm HTN. CT surgery was consulted for CABG versus stenting of the left circumflex back to the left main, they felt that CABG would probably result in pain relief but no change with  survival benefit. This was discussed with Dr. Tamala [name] who felt it was appropriate to control symptoms with medical therapy, with the plan to proceed with further intervention if he fails optimal medical therapy. He was started on Imdur which was titrated up to 60 mg, continued to have chest pain and Ranexa 1000 mg twice a day was added. In the clinic following this hospitalization he reported doing much better, and was given samples for Ranexa given he cannot afford the prescription. His last 2-D echo on 12/2015 showed an EF of 60-65% with no wall motion abnormality, and grade 1 diastolic dysfunction along with trivial mitral regurg. States he has been doing well since his last office visit.  He presented to the ED yesterday evening complaining of dizziness, lightheadedness, and left-sided chest pain with radiation into bilateral arms, while sitting on the toilet attempting to have a bowel movement. Reports it is taken all of his afternoon medications prior to this. In the ED his labs showed a creatinine of 1.16, hemoglobin of 12, BNP 24, point of care troponin negative, repeat troponin cycled and negative 2, and potassium 3.4. Chest x-ray was negative for active cardiopulmonary process. EKG shows sinus rhythm with known first-degree A-V block, no acute ST/T-wave changes. He reports receiving Nitropaste along with 4 mg of morphine which helped alleviate his pain. Of note he also reports right lower quadrant abdominal pain with radiation into the back that has been persistent for the past couple of days. He did receive a dose of morphine for this abdominal pain prior to my assessment. Does report having another episode of left-sided chest pain earlier this morning while sitting in bed. He was admitted to internal medicine service  for further workup.    1. Chest pain: He does have known residual disease noted from his previous cath in 12/2015 as above. Plans at that time were to treat with medical therapy, if he  were to fail optimal treatment then to proceed with potential PCI/CABG. He reports doing well at home on Imdur and Ranexa, with no recurrent episodes of chest pain prior to yesterdays event. --He is currently on maximum medically appropriate therapy at this time --2-D echo on 12/2015 showed EF of 60-65% with no wall motion abnormality, and grade 1 diastolic dysfunction. --Troponins have been cycled and negative 3, EKG shows sinus brady with known first-degree AV block unchanged from previous tracings. --Repeat 2 D echo was ordered via primary team --On telemetry he is noted to be bradycardic with rates in the upper 40s to low 50s, both his Lopressor and propanolol have been [name] in the setting of bradycardia. I question whether the combination of 2 beta blockers along with possible vagal symptoms precipitated the event that brought him into the ER yesterday.    2. HTN: borderline controlled, but has not received some home medications, in the setting of bradycardia  3. HLD: continue statin  4. GERD: continue protonix  5. RLQ abd pain: Says he has been struggling with constipation at home recently, and developed sharp RLQ pain recently. Reports some nausea with this pain, and straining yesterday to have a bowel movement.  --did receive morphine for this pain prior to my assessment, and was pain free.  --he seems rather more concerned about his abd pain this admission. At this time, I would suggest a work up related to his on-going abd pain and constipation.   [name], NP-C Pager [phone] 04/20/2016, 12:37 PM  I have examined the patient and reviewed assessment and plan and discussed with patient.  Agree with above as stated.  More bothersome symptom for him is the abdominal pain and constipation.  No cardiac testing planned as he had no sx at rest or with activity prior to the abdominal issue.  I personally reviewed his cath films.  He has an ostial circ plaque extending into  the left mian.  THis could be treated with PCI but the stent would have to jail the LAD.  Continue medical therapy at this time.   Larae Grooms

## 2016-04-21 ENCOUNTER — Observation Stay (HOSPITAL_COMMUNITY): Payer: Medicare Other

## 2016-04-21 DIAGNOSIS — R42 Dizziness and giddiness: Secondary | ICD-10-CM | POA: Insufficient documentation

## 2016-04-21 DIAGNOSIS — R079 Chest pain, unspecified: Secondary | ICD-10-CM | POA: Diagnosis not present

## 2016-04-21 DIAGNOSIS — I10 Essential (primary) hypertension: Secondary | ICD-10-CM | POA: Diagnosis not present

## 2016-04-21 DIAGNOSIS — G250 Essential tremor: Secondary | ICD-10-CM | POA: Diagnosis not present

## 2016-04-21 DIAGNOSIS — I2510 Atherosclerotic heart disease of native coronary artery without angina pectoris: Secondary | ICD-10-CM | POA: Diagnosis not present

## 2016-04-21 DIAGNOSIS — R0789 Other chest pain: Secondary | ICD-10-CM | POA: Diagnosis not present

## 2016-04-21 LAB — BASIC METABOLIC PANEL
Anion gap: 6 (ref 5–15)
BUN: 11 mg/dL (ref 6–20)
CHLORIDE: 103 mmol/L (ref 101–111)
CO2: 28 mmol/L (ref 22–32)
CREATININE: 1.18 mg/dL (ref 0.61–1.24)
Calcium: 8.6 mg/dL — ABNORMAL LOW (ref 8.9–10.3)
GFR calc Af Amer: >60 mL/min (ref >60)
GFR calc non Af Amer: 59 mL/min — ABNORMAL LOW (ref >60)
GLUCOSE: 172 mg/dL — AB (ref 65–99)
Potassium: 4 mmol/L (ref 3.5–5.1)
Sodium: 137 mmol/L (ref 135–145)

## 2016-04-21 LAB — GLUCOSE, CAPILLARY
GLUCOSE-CAPILLARY: 250 mg/dL — AB (ref 65–99)
Glucose-Capillary: 160 mg/dL — ABNORMAL HIGH (ref 65–99)
Glucose-Capillary: 202 mg/dL — ABNORMAL HIGH (ref 65–99)
Glucose-Capillary: 215 mg/dL — ABNORMAL HIGH (ref 65–99)

## 2016-04-21 LAB — CBC
HEMATOCRIT: 35.7 % — AB (ref 39.0–52.0)
HEMOGLOBIN: 11.5 g/dL — AB (ref 13.0–17.0)
MCH: 27.2 pg (ref 26.0–34.0)
MCHC: 32.2 g/dL (ref 30.0–36.0)
MCV: 84.4 fL (ref 78.0–100.0)
Platelets: 200 10*3/uL (ref 150–400)
RBC: 4.23 MIL/uL (ref 4.22–5.81)
RDW: 13.2 % (ref 11.5–15.5)
WBC: 5.1 10*3/uL (ref 4.0–10.5)

## 2016-04-21 MED ORDER — POLYETHYLENE GLYCOL 3350 17 G PO PACK
17.0000 g | PACK | Freq: Every day | ORAL | Status: DC
  Administered 2016-04-21: 17 g via ORAL
  Filled 2016-04-21: qty 1

## 2016-04-21 MED ORDER — LORAZEPAM 1 MG PO TABS
1.0000 mg | ORAL_TABLET | Freq: Once | ORAL | Status: AC
  Administered 2016-04-21: 1 mg via ORAL
  Filled 2016-04-21: qty 1

## 2016-04-21 MED ORDER — SENNOSIDES-DOCUSATE SODIUM 8.6-50 MG PO TABS
1.0000 | ORAL_TABLET | Freq: Two times a day (BID) | ORAL | Status: DC
  Administered 2016-04-21 – 2016-04-22 (×3): 1 via ORAL
  Filled 2016-04-21 (×3): qty 1

## 2016-04-21 MED ORDER — MECLIZINE HCL 25 MG PO TABS
25.0000 mg | ORAL_TABLET | Freq: Three times a day (TID) | ORAL | Status: DC | PRN
  Administered 2016-04-21 – 2016-04-22 (×3): 25 mg via ORAL
  Filled 2016-04-21 (×3): qty 1

## 2016-04-21 NOTE — Progress Notes (Signed)
SUBJECTIVE:  No further chest pain  OBJECTIVE:   Vitals:   Filed Vitals:   04/21/16 0016 04/21/16 0523 04/21/16 0700 04/21/16 0730  BP: 118/56 133/66    Pulse: 60 61  59  Temp: 97.9 F (36.6 C) 98 F (36.7 C)    TempSrc: Oral Oral    Resp: 18 18  22   Height:      Weight:  265 lb 4.8 oz (120.339 kg)    SpO2: 100% 100% 99%    I&O's:   Intake/Output Summary (Last 24 hours) at 04/21/16 1032 Last data filed at 04/21/16 0947  Gross per 24 hour  Intake   1200 ml  Output   1278 ml  Net    -78 ml        PHYSICAL EXAM General: Well developed, well nourished, in no acute distress Head:   Normal cephalic and atramatic  Lungs:   Clear bilaterally to auscultation. Heart:   HRRR S1 S2  No JVD.   Abdomen: abdomen soft and non-tender Msk:  Back normal,  Normal strength and tone for age. Extremities:   No edema.   Neuro: Alert and oriented. Psych:  Normal affect, responds appropriately Skin: No rash   LABS: Basic Metabolic Panel:  Recent Labs  04/19/16 2008 04/19/16 2228 04/21/16 0337  NA 138  --  137  K 3.4*  --  4.0  CL 106  --  103  CO2 27  --  28  GLUCOSE 77  --  172*  BUN 11  --  11  CREATININE 1.16  --  1.18  CALCIUM 8.9  --  8.6*  MG  --  1.8  --    Liver Function Tests:  Recent Labs  04/19/16 2008  AST 17  ALT 14*  ALKPHOS 83  BILITOT 0.4  PROT 5.8*  ALBUMIN 3.1*    Recent Labs  04/19/16 2008  LIPASE 17   CBC:  Recent Labs  04/19/16 2007 04/21/16 0337  WBC 7.2 5.1  NEUTROABS 4.3  --   HGB 12.0* 11.5*  HCT 36.2* 35.7*  MCV 84.2 84.4  PLT 220 200   Cardiac Enzymes:  Recent Labs  04/19/16 2228 04/20/16 1009  TROPONINI <0.03 <0.03   BNP: Invalid input(s): POCBNP D-Dimer: No results for input(s): DDIMER in the last 72 hours. Hemoglobin A1C: No results for input(s): HGBA1C in the last 72 hours. Fasting Lipid Panel:  Recent Labs  04/20/16 0329  CHOL 159  HDL 31*  LDLCALC 107*  TRIG 106  CHOLHDL 5.1   Thyroid  Function Tests: No results for input(s): TSH, T4TOTAL, T3FREE, THYROIDAB in the last 72 hours.  Invalid input(s): FREET3 Anemia Panel: No results for input(s): VITAMINB12, FOLATE, FERRITIN, TIBC, IRON, RETICCTPCT in the last 72 hours. Coag Panel:   Lab Results  Component Value Date   INR 1.10 12/21/2015   INR 1.05 12/18/2015   INR 0.9 01/27/2015    RADIOLOGY: Dg Chest 2 View  04/19/2016  CLINICAL DATA:  75 year old male with chest pain EXAM: CHEST  2 VIEW COMPARISON:  Chest radiograph dated 12/18/2015 FINDINGS: There is shallow inspiration. There bibasilar subsegmental atelectatic changes of the lungs. There is no focal consolidation, pleural effusion, or pneumothorax. Stable mild cardiomegaly. Coronary vascular stents noted. No acute osseous pathology. IMPRESSION: No active cardiopulmonary disease. Electronically Signed   By: Anner Crete M.D.   On: 04/19/2016 22:07   US Renal  04/20/2016  CLINICAL DATA:  Right flank pain. EXAM: RENAL / URINARY TRACT  ULTRASOUND COMPLETE COMPARISON:  Chest CT, 08/04/2015 FINDINGS: Right Kidney: Length: 11.3 cm. Normal parenchymal echogenicity. There are echogenic foci consistent with stones, largest from the lower pole measuring approximately 17 mm. No masses. No hydronephrosis. Left Kidney: Length: 12.2 cm. Normal parenchymal echogenicity. There are echogenic foci consistent with stones, largest from the upper pole measuring 11 mm, which was covered and present on prior chest CT. No masses. No hydronephrosis. Bladder: Appears normal for degree of bladder distention. IMPRESSION: 1. No acute finding.  No hydronephrosis. 2. Bilateral intrarenal stones. Electronically Signed   By: Lajean Manes M.D.   On: 04/20/2016 16:06      ASSESSMENT: Kathyrn Lass:  CAD: No further chest pain.  No further cardiac w/u.  Continue medical therapy.  Now scheduled for CT of head and management of kidney stones per the patient.  Will sign off.  Jettie Booze, MD    04/21/2016  10:32 AM

## 2016-04-21 NOTE — Care Management Obs Status (Signed)
[place] NOTIFICATION   Patient Details  Name: Edward [name] MRN: [id number] Date of Birth: 1941-07-16   Medicare Observation Status Notification Given:  Yes    Royston Bake, RN 04/21/2016, 2:16 PM

## 2016-04-21 NOTE — Progress Notes (Signed)
RN called to bedside by patient, complaining of dizziness/vertigo.  Patient requesting for medicine for this.  Provider notified, awaiting response.

## 2016-04-21 NOTE — Progress Notes (Signed)
Pt AOx4. Pt instructed of high risk fall potential while hospitalized. Pt has bed alarm precaution but continues to get up without assistance and turns off bed alarm when he wishes to use restroom. Instructed pt of risk and to call before he needs to get up so staff can assist him to restroom safely. Will continue to utilize other fall risk precautions with pt.   Maurene Capes RN

## 2016-04-21 NOTE — Progress Notes (Signed)
Patient ID: Edward [name], male   DOB: Aug 22, 1941, 75 y.o.   MRN: [id number]    PROGRESS NOTE    Edward [name]  [id number] DOB: 02/13/1941 DOA: 04/19/2016  PCP: Ann Held, DO   Brief Narrative:  45 male patient with known hypertension, hyperlipidemia, insulin-dependent diabetes mellitus, GERD, chronic diastolic CHF, and coronary artery disease with DES to LAD (2015), presented for evaluation of sudden onset dizziness and lightheaded ness exacerbated with straining and associated with intermittent episodes of chest discomfort. Pt also reports right flank pain associated with nausea.   Assessment & Plan:  1. Chest pain, dizziness and lightheadedness  - initial EKG with no acute ischemic features, CE so far negative  - CXR with no acute cardiopulmonary disease  - cardiology consulted for assistance, did not think this is cardiac in etiology, cardio team signed off  - CT head requested for further evaluation   2. CAD  - Cath in April 2016 demonstrated 85% occlusion of mid-LAD and DES was placed  - Lt and RHC in February 2017 featured 85% occlusion of LCx, widely patent LAD stent, mod/non-critical RCA dz - CABG considered after most recent cath, decision was made to attempt medical mngt first and reconsider PCI or CABG if he fails  - per cardiology, no indication for further cardiac interventions at this time   3. Hypertension, essential   - reasonable inpatient control  - Continue current management with Norvasc, Lopressor, Imdur  4. Hyperlipidemia  - LDL 115, HDL 34 in February 2017  - Continue current mangement with Pravachol and fenofibrate   5. 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 GERD - Managed with Nexium at home, continue Protonix while in hospital   7. Essential tremor  - Stable, continue current management with Inderal   8. Hypokalemia  - supplemented and WNL this AM  9. Chronic diastolic CHF  - Appears stable, well-compensated  - TTE (12/18/15) with EF [id number], grade 1 diastolic dysfunction, and no significant valvular disease  - Continue current management with Lopressor, Lasix - weight 120 kg this AM and remains stable in the past 24 hours - monitor daily weights, strict I/O  10. Morbid obesity due to excess calories  - Body mass index is 41.54 kg/(m^2)  11. Right flank pain - renal US with bilateral calculi, none are obstruction - will discuss with urology team    DVT prophylaxis: Heparin SQ Code Status: Full  Family Communication: Patient at bedside  Disposition Plan: Home in AM  Consultants:   Cardiology   Procedures:   None  Antimicrobials:  None  Subjective: Pt reports feeling better but still with intermittent dizziness. Asking for CT head.   Objective: Filed Vitals:   04/21/16 0016 04/21/16 0523 04/21/16 0700 04/21/16 0730  BP: 118/56 133/66    Pulse: 60 61  59  Temp: 97.9 F (36.6 C) 98 F (36.7 C)    TempSrc: Oral Oral    Resp: 18 18  22   Height:      Weight:  120.339 kg (265 lb 4.8 oz)    SpO2: 100% 100% 99%  Intake/Output Summary (Last 24 hours) at 04/21/16 1146 Last data filed at 04/21/16 1049  Gross per 24 hour  Intake   1200 ml  Output   1478 ml  Net   -278 ml   Filed Weights   04/19/16 2344 04/20/16 0445 04/21/16 0523  Weight: 121.11 kg (267 lb) 120.521 kg (265 lb 11.2 oz) 120.339 kg (265 lb 4.8 oz)    Examination:  General exam: Appears calm and comfortable  Respiratory system: Clear to auscultation. Respiratory effort normal. Cardiovascular system: S1 & S2 heard, bradycardia. No JVD,  rubs, gallops or clicks. No pedal  edema. Gastrointestinal system: Abdomen is nondistended, soft and nontender.  Central nervous system: Alert and oriented. No focal neurological deficits. Extremities: Symmetric 5 x 5 power. Skin: No rashes, lesions or ulcers Psychiatry: Judgement and insight appear normal. Mood & affect appropriate.    Data Reviewed: I have personally reviewed following labs and imaging studies  CBC:  Recent Labs Lab 04/19/16 2007 04/21/16 0337  WBC 7.2 5.1  NEUTROABS 4.3  --   HGB 12.0* 11.5*  HCT 36.2* 35.7*  MCV 84.2 84.4  PLT 220 [id number]   Basic Metabolic Panel:  Recent Labs Lab 04/19/16 2008 04/19/16 2228 04/21/16 0337  NA 138  --  137  K 3.4*  --  4.0  CL 106  --  103  CO2 27  --  28  GLUCOSE 77  --  172*  BUN 11  --  11  CREATININE 1.16  --  1.18  CALCIUM 8.9  --  8.6*  MG  --  1.8  --    Liver Function Tests:  Recent Labs Lab 04/19/16 2008  AST 17  ALT 14*  ALKPHOS 83  BILITOT 0.4  PROT 5.8*  ALBUMIN 3.1*    Recent Labs Lab 04/19/16 2008  LIPASE 17   Cardiac Enzymes:  Recent Labs Lab 04/19/16 2228 04/20/16 1009  TROPONINI <0.03 <0.03   BNP (last 3 results)  Recent Labs  07/17/15 1139 07/24/15 0841  PROBNP 35.0 24.0   CBG:  Recent Labs Lab 04/20/16 0824 04/20/16 1132 04/20/16 1634 04/20/16 2056 04/21/16 0548  GLUCAP 262* 383* 262* 237* 160*   Lipid Profile:  Recent Labs  04/20/16 0329  CHOL 159  HDL 31*  LDLCALC 107*  TRIG 106  CHOLHDL 5.1   Urine analysis:    Component Value Date/Time   COLORURINE YELLOW 04/19/2016 2203   APPEARANCEUR CLOUDY* 04/19/2016 2203   LABSPEC 1.010 04/19/2016 2203   PHURINE 6.5 04/19/2016 2203   GLUCOSEU NEGATIVE 04/19/2016 2203   HGBUR MODERATE* 04/19/2016 2203   BILIRUBINUR NEGATIVE 04/19/2016 2203   BILIRUBINUR neg 07/17/2014 1523   KETONESUR NEGATIVE 04/19/2016 2203   PROTEINUR NEGATIVE 04/19/2016 2203   PROTEINUR 1 07/17/2014 1523   UROBILINOGEN 0.2 07/17/2014 1523   NITRITE NEGATIVE  04/19/2016 2203   NITRITE neg 07/17/2014 1523   LEUKOCYTESUR TRACE* 04/19/2016 2203   Radiology Studies: Dg Chest 2 View 04/19/2016  No active cardiopulmonary disease.  Scheduled Meds: . amLODipine  10 mg Oral Daily  . aspirin EC  81 mg Oral Daily  . fenofibrate  160 mg Oral Daily  . fluticasone furoate-vilanterol  1 puff Inhalation Daily  . furosemide  40 mg Oral Daily  . heparin  5,000 Units Subcutaneous Q8H  . insulin aspart  0-15 Units Subcutaneous TID WC  . insulin aspart  4 Units Subcutaneous TID WC  . insulin glargine  37 Units Subcutaneous BID  . isosorbide mononitrate  60 mg Oral Daily  . metoprolol  50 mg Oral BID  . nitroGLYCERIN  0.5 inch Topical Q6H  . pantoprazole  40 mg Oral Daily  . polyethylene glycol  17 g Oral Daily  . polyvinyl alcohol  2 drop Both Eyes BID  . potassium chloride SA  20 mEq Oral Daily  . prasugrel  10 mg Oral Daily  . pravastatin  40 mg Oral q1800  . propranolol  40 mg Oral BID  . ranolazine  500 mg Oral BID  . senna-docusate  1 tablet Oral BID  . sodium chloride flush  3 mL Intravenous Q12H   Continuous Infusions:  Time spent: 20 minutes   Faye Ramsay, MD [facility] Pager [phone]  If 7PM-7AM, please contact night-coverage [email or web address] Password [facility] 04/21/2016, 11:46 AM

## 2016-04-22 DIAGNOSIS — R079 Chest pain, unspecified: Secondary | ICD-10-CM | POA: Diagnosis not present

## 2016-04-22 DIAGNOSIS — R42 Dizziness and giddiness: Secondary | ICD-10-CM | POA: Diagnosis not present

## 2016-04-22 DIAGNOSIS — R0789 Other chest pain: Secondary | ICD-10-CM | POA: Diagnosis not present

## 2016-04-22 LAB — BASIC METABOLIC PANEL
Anion gap: 4 — ABNORMAL LOW (ref 5–15)
BUN: 12 mg/dL (ref 6–20)
CHLORIDE: 103 mmol/L (ref 101–111)
CO2: 30 mmol/L (ref 22–32)
Calcium: 8.5 mg/dL — ABNORMAL LOW (ref 8.9–10.3)
Creatinine, Ser: 1.37 mg/dL — ABNORMAL HIGH (ref 0.61–1.24)
GFR calc Af Amer: 57 mL/min — ABNORMAL LOW (ref >60)
GFR calc non Af Amer: 50 mL/min — ABNORMAL LOW (ref >60)
GLUCOSE: 264 mg/dL — AB (ref 65–99)
POTASSIUM: 4 mmol/L (ref 3.5–5.1)
Sodium: 137 mmol/L (ref 135–145)

## 2016-04-22 LAB — GLUCOSE, CAPILLARY
Glucose-Capillary: 195 mg/dL — ABNORMAL HIGH (ref 65–99)
Glucose-Capillary: 194 mg/dL — ABNORMAL HIGH (ref 65–99)
Glucose-Capillary: 174 mg/dL — ABNORMAL HIGH (ref 65–99)

## 2016-04-22 LAB — CBC
HCT: 34.8 % — ABNORMAL LOW (ref 39.0–52.0)
HEMOGLOBIN: 11.4 g/dL — AB (ref 13.0–17.0)
MCH: 27.5 pg (ref 26.0–34.0)
MCHC: 32.8 g/dL (ref 30.0–36.0)
MCV: 83.9 fL (ref 78.0–100.0)
Platelets: 218 10*3/uL (ref 150–400)
RBC: 4.15 MIL/uL — AB (ref 4.22–5.81)
RDW: 13 % (ref 11.5–15.5)
WBC: 5.4 10*3/uL (ref 4.0–10.5)

## 2016-04-22 MED ORDER — MECLIZINE HCL 25 MG PO TABS: 25.0000 mg | ORAL_TABLET | Freq: Three times a day (TID) | ORAL | Status: DC | PRN

## 2016-04-22 NOTE — Evaluation (Signed)
Physical Therapy Evaluation and Discharge Patient Details Name: Edward [name] MRN: [id number] DOB: [birth year] Today's Date: 04/22/2016   History of Present Illness  13 male patient with known hypertension, hyperlipidemia, insulin-dependent diabetes mellitus, GERD, chronic diastolic CHF, and coronary artery disease with DES to LAD (2015), presented for evaluation of sudden onset dizziness and lightheaded ness exacerbated with straining and associated with intermittent episodes of chest discomfort. Pt also reports right flank pain associated with nausea. EKG and troponins negative; cardiology workup was negative; CT head negative; +kidney stones on Rt     Clinical Impression  Patient evaluated by Physical Therapy with no further acute PT needs identified. Portions of Berg Balance assessment and Dynamic Gait Index completed with no imbalance noted. Patient moves at normal velocity. Screened for both peripheral and central vestibular disorders with no positive test results. Patient encouraged to discuss his medications with his primary MD as possible cause of dizziness (as he takes many meds). All education has been completed and the patient has no further questions. PT is signing off. Thank you for this referral.  RN made aware and stated she would contact Dr. Doyle Askew to inform him of PT evaluation results.    Follow Up Recommendations No PT follow up    Equipment Recommendations  None recommended by PT    Recommendations for Other Services       Precautions / Restrictions Precautions Precautions: Fall Precaution Comments: pt reports dizziness, however is very steady with mobility      Mobility  Bed Mobility                  Transfers Overall transfer level: Independent Equipment used: None                Ambulation/Gait Ambulation/Gait assistance: Independent Ambulation Distance (Feet): 200 Feet Assistive device: None Gait Pattern/deviations: WFL(Within Functional  Limits) Gait velocity: good ability to reduce velocity; minimal incr from his normal Gait velocity interpretation: at or above normal speed for age/gender    Stairs            Wheelchair Mobility    Modified Rankin (Stroke Patients Only)       Balance Overall balance assessment: Independent                               Standardized Balance Assessment Standardized Balance Assessment : Dynamic Gait Index;Berg Balance Test Berg Balance Test Sit to Stand: Able to stand  independently using hands Standing Unsupported: Able to stand safely 2 minutes Sitting with Back Unsupported but Feet Supported on Floor or Stool: Able to sit safely and securely 2 minutes Stand to Sit: Sits safely with minimal use of hands Transfers: Able to transfer safely, minor use of hands Standing Unsupported with Eyes Closed: Able to stand 10 seconds safely From Standing, Reach Forward with Outstretched Arm: Can reach forward >5 cm safely (2") (limited by back/flank pain) From Standing Position, Turn to Look Behind Over each Shoulder: Looks behind one side only/other side shows less weight shift Turn 360 Degrees: Able to turn 360 degrees safely in 4 seconds or less Standing Unsupported, One Foot in Front: Able to plae foot ahead of the other independently and hold 30 seconds Dynamic Gait Index Level Surface: Normal Change in Gait Speed: Mild Impairment Gait with Horizontal Head Turns: Mild Impairment Gait with Vertical Head Turns: Normal Gait and Pivot Turn: Normal Step Around Obstacles: Normal  Pertinent Vitals/Pain Pain Assessment: Faces Faces Pain Scale: Hurts even more Pain Location: low back/Rt flank with forward reaching Pain Descriptors / Indicators: Grimacing;Discomfort Pain Intervention(s): Limited activity within patient's tolerance;Repositioned    Home Living Family/patient expects to be discharged to:: Private residence Living Arrangements: Alone Available Help  at Discharge: Family;Friend(s);Neighbor;Available PRN/intermittently Type of Home: House Home Access: Stairs to enter Entrance Stairs-Rails: None Entrance Stairs-Number of Steps: 2 Home Layout: One level Home Equipment: None      Prior Function Level of Independence: Independent               Hand Dominance   Dominant Hand: Right    Extremity/Trunk Assessment   Upper Extremity Assessment: Overall WFL for tasks assessed           Lower Extremity Assessment: Overall WFL for tasks assessed      Cervical / Trunk Assessment: Other exceptions (flattened lordosis)  Communication   Communication: No difficulties  Cognition Arousal/Alertness: Awake/alert Behavior During Therapy: WFL for tasks assessed/performed Overall Cognitive Status: Within Functional Limits for tasks assessed                      General Comments General comments (skin integrity, edema, etc.): Patient reports his current dizziness is a spinning  WHEN HE CLOSES HIS EYES. Patient had normal amount of sway and balance reactions when standing with eyes closed x 15 seconds. Discussed no peripheral or central vestibular deficits noted and he should discuss his medications with primary MD (he is taking quite a number).    Exercises        Assessment/Plan    PT Assessment Patent does not need any further PT services  PT Diagnosis     PT Problem List    PT Treatment Interventions     PT Goals (Current goals can be found in the Care Plan section) Acute Rehab PT Goals PT Goal Formulation: All assessment and education complete, DC therapy    Frequency     Barriers to discharge        Co-evaluation               End of Session Equipment Utilized During Treatment: Gait belt Activity Tolerance: Patient tolerated treatment well Patient left: in chair;with call bell/phone within reach Nurse Communication: Mobility status;Other (comment) (OK to d/c from PT perspective)    Functional  Assessment Tool Used: clinical judgment Functional Limitation: Mobility: Walking and moving around Mobility: Walking and Moving Around Current Status [phone]): 0 percent impaired, limited or restricted Mobility: Walking and Moving Around Goal Status [phone]): 0 percent impaired, limited or restricted Mobility: Walking and Moving Around Discharge Status [phone]): 0 percent impaired, limited or restricted    Time: [id number] PT Time Calculation (min) (ACUTE ONLY): 16 min   Charges:   PT Evaluation $PT Eval Low Complexity: 1 Procedure     PT G Codes:   PT G-Codes **NOT FOR INPATIENT CLASS** Functional Assessment Tool Used: clinical judgment Functional Limitation: Mobility: Walking and moving around Mobility: Walking and Moving Around Current Status [id number]): 0 percent impaired, limited or restricted Mobility: Walking and Moving Around Goal Status [id number]): 0 percent impaired, limited or restricted Mobility: Walking and Moving Around Discharge Status [id number]): 0 percent impaired, limited or restricted    Annemarie Sebree 04/22/2016, 3:36 PM  Pager [phone]

## 2016-04-22 NOTE — Discharge Instructions (Signed)

## 2016-04-22 NOTE — Discharge Summary (Signed)
Physician Discharge Summary  Edward [name] WIO:[id number] DOB: Edward [name] DOA: 04/19/2016  PCP: Ann Held, DO  Admit date: 04/19/2016 Discharge date: 04/22/2016  Recommendations for Outpatient Follow-up:  1. Pt will need to follow up with PCP in 1-2 weeks post discharge 2. Please obtain BMP to evaluate electrolytes and kidney function 3. Please also check CBC to evaluate Hg and Hct levels 4. Pt made aware that his urologist office was called and will call him back with the appointment time and date, specific reason for follow up is to evaluate if any interventions needed for non obstructing renal stones, will forward this note to Dr. Junious Silk  5. Also pt concerned with hearing problems on the right and left side, wanted to have follow up with ENT specialist, appointment has been scheduled, please see below 6. Pt given script for Meclizine  7. Pt advised if dizziness worse, he needs to see his neurologist Dr. Posey Pronto, pt says he already has an appointment scheduled   Discharge Diagnoses:  Principal Problem:   Chest pain Active Problems:   CAD S/P LAD DES 01/30/15   Dizziness  Discharge Condition: Stable  Diet recommendation: Heart healthy diet discussed in details   Brief Narrative:  36 male patient with known hypertension, hyperlipidemia, insulin-dependent diabetes mellitus, GERD, chronic diastolic CHF, and coronary artery disease with DES to LAD (2015), presented for evaluation of sudden onset dizziness and lightheaded ness exacerbated with straining and associated with intermittent episodes of chest discomfort. Pt also reports right flank pain associated with nausea.   Assessment & Plan:  1. Chest pain, dizziness and lightheadedness  - initial EKG with no acute ischemic features, CE so far negative  - CXR with no acute cardiopulmonary disease  - cardiology consulted for assistance, did not think this is cardiac in etiology, cardio team signed off  - CT head  unremarkable, pt has declined offer to have MRI brain done, he said he will discuss with his neurologist first  - PT eval requested and had no further recommendations, no PT follow up needed  - pt made aware that if dizziness gets worse he needs to follow up with his neurologist Dr. Posey Pronto - with pt's reported problems with hearing unclear if that is also related to his dizziness, outpatient follow up with ENT scheduled as I was unable to detect specific hearing problems on exam  - pt given script for Meclizine  - pt was made aware that his dizziness can be related to long standing diabetes and autonomic dysfunction, dysequilibrium  2. CAD  - Cath in April 2016 demonstrated 85% occlusion of mid-LAD and DES was placed  - Lt and RHC in February 2017 featured 85% occlusion of LCx, widely patent LAD stent, mod/non-critical RCA dz - CABG considered after most recent cath, decision was made to attempt medical mngt first and reconsider PCI or CABG if he fails  - per cardiology, no indication for further cardiac interventions at this time  - pt with no chest pain or dyspnea   3. Hypertension, essential  - reasonable inpatient control  - Continue current management with Norvasc, Lopressor, Imdur  4. Hyperlipidemia  - LDL 115, HDL 34 in February 2017  - continue home medical regimen   5. 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 Autonomic dysfunction/dysequilibrium  - A1c 9.0% in March 2017, reflecting poor control  - Followed by endocrinology in the outpatient setting and managed with Novolin 70/30 40 units qAM and 60 units qPM,  and Novolin 20-30 units TID with meals  6. GERD - Managed with Nexium at home, continue Protonix while in hospital   7. Essential tremor  - Stable, continue current management with Inderal   8. Hypokalemia  - supplemented   9. Chronic diastolic CHF  - Appears stable, well-compensated  - TTE (12/18/15) with EF 54-36%, grade 1 diastolic dysfunction,  and no significant valvular disease  - Continue current management with Lopressor, Lasix - weight 120 kg this AM and remains stable in the past 24 hours 10. Morbid obesity due to excess calories  - Body mass index is 41.54 kg/(m^2)  11. Right flank pain - renal US with bilateral calculi, none are obstructing (has been seen on previous studies) - outpatient follow with pt's urologist   Code Status: Full  Family Communication: Patient at bedside  Disposition Plan: Home   Consultants:   Cardiology  Procedures:   None  Antimicrobials:  None  Discharge Exam: Filed Vitals:   04/22/16 0421 04/22/16 1145  BP: 153/61 154/57  Pulse: 67 55  Temp: 98.1 F (36.7 C) 97.5 F (36.4 C)  Resp: 18 18   Filed Vitals:   04/21/16 2042 04/22/16 0421 04/22/16 1015 04/22/16 1145  BP: 159/61 153/61  154/57  Pulse: 71 67  55  Temp: 98.1 F (36.7 C) 98.1 F (36.7 C)  97.5 F (36.4 C)  TempSrc: Oral Oral  Oral  Resp: _0 Height:      Weight:  120.566 kg (265 lb 12.8 oz)    SpO2: 100% 100% 98% 100%    General: Pt is alert, follows commands appropriately, not in acute distress Cardiovascular: Regular rhythm, mild bradycardia, no murmurs, no rubs, no gallops Respiratory: Clear to auscultation bilaterally, no wheezing, no crackles, no rhonchi Abdominal: Soft, non tender, non distended, bowel sounds +, no guarding Extremities:no cyanosis, pulses palpable bilaterally DP and PT  Discharge Instructions     Medication List    TAKE these medications        amLODipine 10 MG tablet  Commonly known as:  NORVASC  take 1 tablet by mouth once daily     aspirin EC 81 MG tablet  Take 81 mg by mouth daily.     B-D INS SYR ULTRAFINE 1CC/31G 31G X 5/16" 1 ML Misc  Generic drug:  Insulin Syringe-Needle U-100  inject three times a day AS INSTRUCTED     BD INSULIN SYRINGE U-500 31G X 6MM 0.5 ML Misc  Generic drug:  Insulin Syringe/Needle U-500     Ciclopirox 1 % shampoo  Apply 1  application topically at bedtime. At night after showering     clonazePAM 1 MG tablet  Commonly known as:  KLONOPIN  Take 1 tablet (1 mg total) by mouth at bedtime as needed for anxiety.     dextromethorphan-guaiFENesin 30-600 MG 12hr tablet  Commonly known as:  MUCINEX DM  Take 1 tablet by mouth 2 (two) times daily.     diclofenac sodium 1 % Gel  Commonly known as:  VOLTAREN  Apply 2 g topically 4 (four) times daily.     dicyclomine 10 MG capsule  Commonly known as:  BENTYL  Take 1 capsule (10 mg total) by mouth 3 (three) times daily before meals.     esomeprazole 40 MG capsule  Commonly known as:  NEXIUM  Take 1 capsule (40 mg total) by mouth daily at 12 noon.     fenofibrate 160 MG tablet  Take 1 tablet (  160 mg total) by mouth daily.     fluticasone furoate-vilanterol 100-25 MCG/INH Aepb  Commonly known as:  BREO ELLIPTA  Inhale 1 puff into the lungs daily.     furosemide 40 MG tablet  Commonly known as:  LASIX  Take 1 tablet (40 mg total) by mouth daily.     glucose blood test strip  Use to test blood sugar 4 times daily instructed. Dx: E11.59     insulin NPH-regular Human (70-30) 100 UNIT/ML injection  Commonly known as:  NOVOLIN 70/30  Inject under skin 40 units in the morning and 60 units before dinner     insulin regular 100 units/mL injection  Commonly known as:  NOVOLIN R  Inject 0.2-0.3 mLs (20-30 Units total) into the skin 3 (three) times daily before meals.     Insulin Syringes (Disposable) U-100 1 ML Misc  Use 4x a day     isosorbide mononitrate 60 MG 24 hr tablet  Commonly known as:  IMDUR  Take 1 tablet (60 mg total) by mouth daily.     meclizine 25 MG tablet  Commonly known as:  ANTIVERT  Take 1 tablet (25 mg total) by mouth 3 (three) times daily as needed for dizziness (vertigo).     metoprolol 50 MG tablet  Commonly known as:  LOPRESSOR  Take 1 tablet (50 mg total) by mouth 2 (two) times daily.     nitroGLYCERIN 0.4 MG SL tablet  Commonly  known as:  NITROSTAT  Place 1 tablet (0.4 mg total) under the tongue every 5 (five) minutes as needed for chest pain (CP or SOB).     ONE TOUCH ULTRA SYSTEM KIT w/Device Kit  Use to test blood sugar daily as instructed. Dx code: E11.59     pantoprazole 40 MG tablet  Commonly known as:  PROTONIX  Take 40 mg by mouth daily.     potassium chloride SA 20 MEQ tablet  Commonly known as:  K-DUR,KLOR-CON  Take 1 tablet (20 mEq total) by mouth daily.     prasugrel 10 MG Tabs tablet  Commonly known as:  EFFIENT  Take 1 tablet (10 mg total) by mouth daily.     pravastatin 40 MG tablet  Commonly known as:  PRAVACHOL  Take 1 tablet (40 mg total) by mouth daily.     propranolol 40 MG tablet  Commonly known as:  INDERAL  Take 1 tablet (40 mg total) by mouth 2 (two) times daily.     ranolazine 1000 MG SR tablet  Commonly known as:  RANEXA  Take 1 tablet (1,000 mg total) by mouth 2 (two) times daily.     REFRESH OP  Place 1 drop into both eyes daily as needed (for dry eyes). Reported on 12/25/2015     VENTOLIN HFA 108 (90 Base) MCG/ACT inhaler  Generic drug:  albuterol  Inhale 2 puffs into the lungs every 6 (six) hours as needed. (SHORTNESS OR BREATH / WHEEZING)            Follow-up Information    Call [facility], MATTHEW, MD.   Specialty:  Urology   Why:  the office will call you with appointment time an date of the appointment    Contact information:   [place] [id number] [phone]       Follow up with Melida Quitter, MD On 05/19/2016.   Specialty:  Otolaryngology   Why:  arrive at 8:45 am on July 20th   Contact information:  [address] [place] [address] [phone]       Call Faye Ramsay, MD.   Specialty:  Internal Medicine   Why:  As needed call my cell phone [phone]   Contact information:   [address] [place] [address] [phone]        The results of significant diagnostics from  this hospitalization (including imaging, microbiology, ancillary and laboratory) are listed below for reference.     Microbiology: No results found for this or any previous visit (from the past 240 hour(s)).   Labs: Basic Metabolic Panel:  Recent Labs Lab 04/19/16 2008 04/19/16 2228 04/21/16 0337 04/22/16 0212  NA 138  --  137 137  K 3.4*  --  4.0 4.0  CL 106  --  103 103  CO2 27  --  28 30  GLUCOSE 77  --  172* 264*  BUN 11  --  11 12  CREATININE 1.16  --  1.18 1.37*  CALCIUM 8.9  --  8.6* 8.5*  MG  --  1.8  --   --    Liver Function Tests:  Recent Labs Lab 04/19/16 2008  AST 17  ALT 14*  ALKPHOS 83  BILITOT 0.4  PROT 5.8*  ALBUMIN 3.1*    Recent Labs Lab 04/19/16 2008  LIPASE 17   CBC:  Recent Labs Lab 04/19/16 2007 04/21/16 0337 04/22/16 0212  WBC 7.2 5.1 5.4  NEUTROABS 4.3  --   --   HGB 12.0* 11.5* 11.4*  HCT 36.2* 35.7* 34.8*  MCV 84.2 84.4 83.9  PLT 220 200 218   Cardiac Enzymes:  Recent Labs Lab 04/19/16 2228 04/20/16 1009  TROPONINI <0.03 <0.03   BNP: BNP (last 3 results)  Recent Labs  10/31/15 1200 12/18/15 0800 04/19/16 2348  BNP 31.1 43.8 24.7    ProBNP (last 3 results)  Recent Labs  07/17/15 1139 07/24/15 0841  PROBNP 35.0 24.0    CBG:  Recent Labs Lab 04/21/16 1153 04/21/16 1649 04/21/16 2045 04/22/16 0611 04/22/16 1148  GLUCAP 202* 215* 250* 195* 194*   SIGNED: Time coordinating discharge: 30 minutes  MAGICK-Davan Hark, MD  [facility] 04/22/2016, 3:44 PM Pager [phone]  If 7PM-7AM, please contact night-coverage [email or web address] Password [email or web address]

## 2016-04-22 NOTE — Progress Notes (Signed)
Orders received for pt discharge.  Discharge summary printed and reviewed with pt.  Explained medication regimen, and pt had no further questions at this time.  IV removed and site remains clean, dry, intact.  Telemetry removed.  Pt in stable condition and awaiting transport. 

## 2016-04-25 ENCOUNTER — Telehealth: Payer: Self-pay | Admitting: Behavioral Health

## 2016-04-25 NOTE — Telephone Encounter (Signed)
Attempted to reach patient for TCM/Hospital Follow-up call. Left message for patient to return call when available.    

## 2016-04-26 NOTE — Telephone Encounter (Signed)
Left message x 2 for TCM/Hospital Follow-up.

## 2016-04-29 NOTE — Patient Outreach (Signed)
Mr. Lemond Mckinney called to discontinue the [facility] Calls, they have been deactivated.

## 2016-04-29 NOTE — Telephone Encounter (Signed)
Tonja with [facility] is calling and requesting status of order, please call back @ [phone] ext [phone]

## 2016-04-29 NOTE — Telephone Encounter (Signed)
Tonja w/ [facility] is calling again, request call back

## 2016-05-02 NOTE — Telephone Encounter (Signed)
These orders have been faxed and mailed to [facility]. JG//CMA

## 2016-05-04 ENCOUNTER — Telehealth: Payer: Self-pay | Admitting: Adult Health

## 2016-05-04 NOTE — Telephone Encounter (Signed)
Notes Recorded by Glean Hess, CMA on 04/27/2016 at 3:10 PM Left message for patient to call back.  Notes Recorded by Beckie Busing, CMA on 04/25/2016 at 10:49 AM Attempted to contact patient regarding results. Left message on voicemail for patient to return call. Notes Recorded by Len Blalock, CMA on 04/21/2016 at 10:48 AM lmtcb X1 for pt to relay results/recs.  Notes Recorded by Juanito Doom, MD on 04/20/2016 at 4:30 PM A, Please let him know that this showed some minor changes compared to the last study but no major changes which is good. We will discuss more on the next visit. Thanks B ------------------------------------------------------------ lmtcb x1 for pt to review PFT results.

## 2016-05-05 NOTE — Telephone Encounter (Signed)
lmtcb for pt.  

## 2016-05-05 NOTE — Telephone Encounter (Signed)
Called spoke with pt. Reviewed BQ's results and recs. I scheduled him for an ov with BQ on 06/28/16. He voiced understanding and had no further questions. Nothing further needed.

## 2016-05-12 NOTE — Telephone Encounter (Signed)
As per Joen Laura orders never received please re fax # [phone]

## 2016-05-16 DIAGNOSIS — N200 Calculus of kidney: Secondary | ICD-10-CM | POA: Diagnosis not present

## 2016-05-16 DIAGNOSIS — R350 Frequency of micturition: Secondary | ICD-10-CM | POA: Diagnosis not present

## 2016-05-16 DIAGNOSIS — N401 Enlarged prostate with lower urinary tract symptoms: Secondary | ICD-10-CM | POA: Diagnosis not present

## 2016-05-16 NOTE — Telephone Encounter (Signed)
Already sent for scanning  This was also mailed to them as noted below

## 2016-05-16 NOTE — Telephone Encounter (Signed)
Requesting fax to be resent, [facility], Fax # [phone]

## 2016-05-27 DIAGNOSIS — N200 Calculus of kidney: Secondary | ICD-10-CM | POA: Diagnosis not present

## 2016-06-07 DIAGNOSIS — J383 Other diseases of vocal cords: Secondary | ICD-10-CM | POA: Diagnosis not present

## 2016-06-07 DIAGNOSIS — R42 Dizziness and giddiness: Secondary | ICD-10-CM | POA: Diagnosis not present

## 2016-06-07 DIAGNOSIS — I252 Old myocardial infarction: Secondary | ICD-10-CM | POA: Diagnosis not present

## 2016-06-07 DIAGNOSIS — Z95828 Presence of other vascular implants and grafts: Secondary | ICD-10-CM | POA: Diagnosis not present

## 2016-06-07 DIAGNOSIS — H6122 Impacted cerumen, left ear: Secondary | ICD-10-CM | POA: Diagnosis not present

## 2016-06-07 DIAGNOSIS — J343 Hypertrophy of nasal turbinates: Secondary | ICD-10-CM | POA: Diagnosis not present

## 2016-06-09 ENCOUNTER — Other Ambulatory Visit: Payer: Self-pay | Admitting: Internal Medicine

## 2016-06-09 ENCOUNTER — Telehealth: Payer: Self-pay | Admitting: Internal Medicine

## 2016-06-09 NOTE — Telephone Encounter (Signed)
New message       Calling to verify that Dr Debara Pickett did scratch his name off the the plan of care form from feb.  It was faxed to them but it appears that Dr [facility] name was scratched off.  Please call

## 2016-06-09 NOTE — Telephone Encounter (Signed)
Returned [employer]. She notes Dr. Lysbeth Penner name was crossed off patient's care plan. Reviewed chart. Clarification given that Dr. Tamala Julian is patient's primary cardiologist. I do not see a record of Dr. Debara Pickett seeing patient, advised if his name was crossed off by [place], likely for this reason. Edward [name] has paperwork she needs to send for cardiology review. Recommended to send to attn of Dr. Tamala Julian and gave her [facility] fax #.

## 2016-06-13 ENCOUNTER — Ambulatory Visit (INDEPENDENT_AMBULATORY_CARE_PROVIDER_SITE_OTHER): Payer: Medicare Other | Admitting: Internal Medicine

## 2016-06-13 ENCOUNTER — Encounter: Payer: Self-pay | Admitting: Internal Medicine

## 2016-06-13 VITALS — BP 160/80 | HR 68 | Ht 67.5 in | Wt 268.0 lb

## 2016-06-13 DIAGNOSIS — Z794 Long term (current) use of insulin: Secondary | ICD-10-CM

## 2016-06-13 DIAGNOSIS — E1151 Type 2 diabetes mellitus with diabetic peripheral angiopathy without gangrene: Secondary | ICD-10-CM | POA: Diagnosis not present

## 2016-06-13 DIAGNOSIS — I200 Unstable angina: Secondary | ICD-10-CM | POA: Diagnosis not present

## 2016-06-13 DIAGNOSIS — E1159 Type 2 diabetes mellitus with other circulatory complications: Secondary | ICD-10-CM | POA: Diagnosis not present

## 2016-06-13 LAB — POCT GLYCOSYLATED HEMOGLOBIN (HGB A1C): Hemoglobin A1C: 9.4

## 2016-06-13 MED ORDER — INSULIN NPH ISOPHANE & REGULAR (70-30) 100 UNIT/ML ~~LOC~~ SUSP: SUBCUTANEOUS | 3 refills | Status: DC

## 2016-06-13 MED ORDER — INSULIN REGULAR HUMAN 100 UNIT/ML IJ SOLN: 20.0000 [IU] | Freq: Three times a day (TID) | INTRAMUSCULAR | 3 refills | Status: DC

## 2016-06-13 NOTE — Patient Instructions (Addendum)
  Please change the doses of insulin:  Insulin  Before breakfast  Before lunch  Before dinner   Novolin 70/30  40  60 (move this before dinner)  Novolin 20 30 units with a smaller meal 36 units with a larger meal  30 units with a smaller meal 36 units with a larger meal   Please come back in 3 months.

## 2016-06-13 NOTE — Progress Notes (Signed)
Patient ID: Edward [name], male   DOB: 1940-11-06, 75 y.o.   MRN: [id number]  HPI: Edward [name] is a 75 y.o.-year-old male, returning for f/u for DM2,  dx 1987, insulin-dependent since 1989, uncontrolled, with complications (CAD - s/p 2 AMIs, PN). [place] moved from [place] in 12/2013. Last visit 3 mo ago.  Pt was admitted 04/19/2016: CP, AP, lightheadedness. He was r/o for AMI. He was started Effient and Ranexa. He was also found to have 2 kidney stones.  He is eating out more now >> sugars are higher.   Last hemoglobin A1c was lower after starting U500 insulin, now increasing after switching back to U100 insulin:  Lab Results  Component Value Date   HGBA1C 9.0 01/18/2016   HGBA1C 8.9 10/19/2015   HGBA1C 10.5 08/20/2015   He was on: U500 insulin 30 min before a meal: - breakfast: 16-18 units  - lunch: 16-18 units  - dinner: 14-16 units  U500 sliding scale: 150-200: + 1 unit 201-250: + 2 units 251-300: + 3 units 301-350: + 4 units >350: + 5 units  He had to go back to Novolon 70/30 >> could not restart U500 -  he cannot afford it.  Now on: Insulin  Before breakfast  Before lunch  Before dinner  Bedtime  Novolin 70/30  40   60 >> moved this from before dinner!!!  Novolin 20 30 30    He was on Metformin >> N/V/D.  Pt checks his sugars 3-4 a day (no log): - am: 90-120s >> 150-240 >> 125, 156-200, 265 >> 110, 120-130, 234 >> 100-140 >> 68, 110-115 >> 170s - 2h after b'fast:200 >> 78 x1, 209-337 >> 128-263, 380 >> 170-180 >> 120-125 >> n/c - before lunch: 68, 178-299, 319  >> 130s >> 200 >> 125,135-169, 212 >> 170s >> 130-200 >> 170-180 >> 130-140 - 2h after lunch: n/c >> 222, 375, 545 >> 198-309 >> 121-254 >> 170-180s >> n/c >> n/c - before dinner: 302, 395 >> 170s >> 200 >> 123, 134-212 >> 150-170 >> 150-200 >> 150-160, 200 >> 180-190 - 2h after dinner: n/c >> 301-379>> 99, 128, 368 >> n/c >> 280 >> n/c - bedtime: 200-300 >> 190-463 >> 46, 284 >> <200 >> 190-200 >> 93-198 >> n/c >>  160s >> n/c >>  - nighttime: n/c >> 300s >> 139-339 >> n/c >> 52 x2 >> 83. Lowest sugar was 93 - he has hypoglycemia awareness at 90  >> 50-70s 4-5x (at night) >> 83 >> 68. Highest sugar was 398 >> 200s >> 300s 3x (takes 5 units) >> low 200s >> 200s.  Has a One Touch Ultra meter.   - mild CKD, last BUN/creatinine:  Lab Results  Component Value Date   BUN 12 04/22/2016   CREATININE 1.37 (H) 04/22/2016  On Lisinopril. ACR 37.6 in 02/11/2014) - Has HL. Last set of lipids: Lab Results  Component Value Date   CHOL 159 04/20/2016   HDL 31 (L) 04/20/2016   LDLCALC 107 (H) 04/20/2016   LDLDIRECT 144.0 07/08/2015   TRIG 106 04/20/2016   CHOLHDL 5.1 04/20/2016  Not on a statin. He gets leg cramps from Lipitor >> cannot tolerate it. - last eye exam was in 02/29/2016 (Dr Zigmund Daniel). Has DR. Had cataracts sx.  - + numbness and tingling in his feet. He saw podiatrist (05/2015) >> has PN. On ASA 81.  Pt was admitted with CP 12/18/2015 >> had another AMI. He was started on Ranexa and Effient, his  dose of Imdur was increased. Plan is to continue medical tx.  I reviewed pt's medications, allergies, PMH, social hx, family hx, and changes were documented in the history of present illness. Otherwise, unchanged from my initial visit note, except he started Effient.  ROS: Constitutional: + weight loss, no fatigue, no hot flushes, + nocturia Eyes: no blurry vision, no xerophthalmia ENT: no sore throat, no nodules palpated in throat, no dysphagia/odynophagia, no hoarseness Cardiovascular: less CP/+ SOB/no palpitations/+ leg swelling Respiratory: no cough/+ SOB/+ wheezing Gastrointestinal: + N/no V/D/C/heartburn Musculoskeletal: no muscle aches/+ joint aches  Skin: no rash Neurological: + tremors/no numbness/tingling/dizziness  PE: BP (!) 160/80 (BP Location: Left Arm, Patient Position: Sitting)   Pulse 68   Ht 5' 7.5" (1.715 m)   Wt 268 lb (121.6 kg)   SpO2 98%   BMI 41.36 kg/m  Body mass  index is 41.36 kg/m. Wt Readings from Last 3 Encounters:  06/13/16 268 lb (121.6 kg)  04/22/16 265 lb 12.8 oz (120.6 kg)  03/11/16 274 lb (124.3 kg)   Constitutional: obese, in NAD Eyes: PERRLA, EOMI, no exophthalmos ENT: moist mucous membranes, no thyromegaly, no cervical lymphadenopathy Cardiovascular: RRR, No MRG, + B pitting leg swelling  Respiratory: CTA B Gastrointestinal: abdomen soft, NT, ND, BS+ Musculoskeletal: no deformities, strength intact in all 4 Skin: moist, warm, no rashes Neurological: no tremor with outstretched hands, DTR normal in all 4  ASSESSMENT: 1. DM2, insulin-dependent, uncontrolled, with complications - CAD H/o steroid inj's  Cardiologist: Dr Tamala Julian. Pulmonologist: Dr Lake Bells  PLAN:  1. Patient with long-standing, uncontrolled diabetes, previously on U500 insulin, now back on Novolin 70/30 + Novolin due to finances. Sugars are better, but still high. Before last visit, he started eating better and being more active and wanted me to keep the insulin doses the same for now until this visit >> however, I do not see an improvement, and actually sugars are higher in am 2/2 eating out more at lunch or at dinnertime (new lady friend). Will give him a more flexible regimen with these meals. Will also move the 70/30 from bedtime (!) to before dinner. - I suggested to:  Patient Instructions    Please change the doses of insulin:  Insulin  Before breakfast  Before lunch  Before dinner   Novolin 70/30  40  60 (move this before dinner)  Novolin 20 30 units with a smaller meal 36 units with a larger meal  30 units with a smaller meal 36 units with a larger meal   Please come back in 3 months.  - continue checking sugars at different times of the day - check 3-4 times a day, rotating checks - up to date with yearly eye exams  - HbA1c today: 9.4% (higher) - Return to clinic in 3 mo with sugar log

## 2016-06-13 NOTE — Addendum Note (Signed)
Addended by: Caprice Beaver T on: 06/13/2016 10:38 AM   Modules accepted: Orders

## 2016-06-14 ENCOUNTER — Encounter: Payer: Self-pay | Admitting: Interventional Cardiology

## 2016-06-14 ENCOUNTER — Ambulatory Visit (INDEPENDENT_AMBULATORY_CARE_PROVIDER_SITE_OTHER): Payer: Medicare Other | Admitting: Interventional Cardiology

## 2016-06-14 VITALS — BP 150/74 | HR 80 | Ht 67.0 in | Wt 267.1 lb

## 2016-06-14 DIAGNOSIS — I2510 Atherosclerotic heart disease of native coronary artery without angina pectoris: Secondary | ICD-10-CM

## 2016-06-14 DIAGNOSIS — I200 Unstable angina: Secondary | ICD-10-CM

## 2016-06-14 DIAGNOSIS — G4733 Obstructive sleep apnea (adult) (pediatric): Secondary | ICD-10-CM

## 2016-06-14 DIAGNOSIS — E785 Hyperlipidemia, unspecified: Secondary | ICD-10-CM

## 2016-06-14 DIAGNOSIS — Z01818 Encounter for other preprocedural examination: Secondary | ICD-10-CM

## 2016-06-14 DIAGNOSIS — I10 Essential (primary) hypertension: Secondary | ICD-10-CM | POA: Diagnosis not present

## 2016-06-14 NOTE — Patient Instructions (Signed)
Medication Instructions:   NO CHANGE  Follow-Up:  Your physician wants you to follow-up in: [place] will receive a reminder letter in the mail two months in advance. If you don't receive a letter, please call our office to schedule the follow-up appointment.   If you need a refill on your cardiac medications before your next appointment, please call your pharmacy.   CONTINUE TO STAY PHYSICALLY ACTIVE

## 2016-06-14 NOTE — Progress Notes (Signed)
Cardiology Office Note    Date:  06/14/2016   ID:  Edward [name], DOB Dec 14, 1940, MRN [id number]  PCP:  Edward Held, DO  Cardiologist: Sinclair Grooms, MD   Chief Complaint  Patient presents with  . Coronary Artery Disease    History of Present Illness:  Edward [name] is a 75 y.o. male who presents for follow-up of CAD with April 2016 drug-eluting stent and February 2017 unstable angina due to high-grade circumflex being treated with medical therapy. Also chronic diastolic heart failure, hypertension, suspected sleep apnea, and obesity.  The circumflex lesion is not treatable by intervention without involving the left main. Ranexa was added in February when he presented with unstable angina and dramatic improvement in symptoms have occurred. Overall he is stable. Not using nitroglycerin on a regular basis.   Past Medical History:  Diagnosis Date  . Adenomatous colon polyp 04/2011  . CAD (coronary artery disease)    a. 01/2015 DES to LAD  b. 12/2015: [place] 85% oLCx lesion--> rx therapy.  . Chronic diastolic CHF (congestive heart failure) ([place])   . DM type 2 (diabetes mellitus, type 2), insulin dependent 01/29/2014   fasting cbg 50-120 with new regimen  . Dyslipidemia, goal LDL below 70 01/29/2014  . Enlarged prostate   . Essential tremor    a. on proprnolol  . GERD (gastroesophageal reflux disease)   . Hypertension   . Internal hemorrhoid   . Sleep apnea    does not use cpap (06/04/2015)  . TIA (transient ischemic attack) 2002    Past Surgical History:  Procedure Laterality Date  . BACK SURGERY    . CARDIAC CATHETERIZATION  01/28/14   + CAD treat medically  . CARDIAC CATHETERIZATION N/A 12/21/2015   Procedure: Right/Left Heart Cath and Coronary Angiography;  Surgeon: Belva Crome, MD;  Location: [facility];  Service: Cardiovascular;  Laterality: N/A;  . CATARACT EXTRACTION Bilateral   . CATARACT EXTRACTION, BILATERAL Bilateral   . CORONARY ANGIOPLASTY WITH  STENT PLACEMENT  01/30/2015   DES [place]  [place] to LAD by Dr Tamala Julian  . JOINT REPLACEMENT    . LEFT HEART CATHETERIZATION WITH CORONARY ANGIOGRAM N/A 01/28/2014   Procedure: LEFT HEART CATHETERIZATION WITH CORONARY ANGIOGRAM;  Surgeon: Sinclair Grooms, MD;  Location: [facility] C[facility];  Service: Cardiovascular;  Laterality: N/A;  . LEFT HEART CATHETERIZATION WITH CORONARY ANGIOGRAM N/A 01/30/2015   Procedure: LEFT HEART CATHETERIZATION WITH CORONARY ANGIOGRAM;  Surgeon: Belva Crome, MD;  Location: [facility] CATH LAB;  Service: Cardiovascular;  Laterality: N/A;  . LUMBAR [place]    . SHOULDER ARTHROSCOPY Right 07/30/2014   Procedure: Right Shoulder Arthroscopy, Debridement, Decompression, Manipulation Under Anesthesia;  Surgeon: Newt Minion, MD;  Location: [facility];  Service: Orthopedics;  Laterality: Right;  . TOTAL KNEE ARTHROPLASTY Bilateral     Current Medications: Outpatient Medications Prior to Visit  Medication Sig Dispense Refill  . amLODipine (NORVASC) 10 MG tablet take 1 tablet by mouth once daily 30 tablet 11  . aspirin EC 81 MG tablet Take 81 mg by mouth daily.    . B-D INS SYR ULTRAFINE 1CC/31G 31G X 5/16" 1 ML MISC inject three times a day AS INSTRUCTED  1  . BD INSULIN SYRINGE U-500 31G X 6MM 0.5 ML MISC   1  . Blood Glucose Monitoring Suppl (ONE TOUCH ULTRA SYSTEM KIT) W/DEVICE KIT Use to test blood sugar daily as instructed. Dx code: E11.59 1 each  0  . Ciclopirox 1 % shampoo Apply 1 application topically at bedtime. At night after showering  0  . clonazePAM (KLONOPIN) 1 MG tablet Take 1 tablet (1 mg total) by mouth at bedtime as needed for anxiety. 30 tablet 2  . dextromethorphan-guaiFENesin (MUCINEX DM) 30-600 MG 12hr tablet Take 1 tablet by mouth 2 (two) times daily. (Patient taking differently: Take 1 tablet by mouth 2 (two) times daily as needed. ) 30 tablet 0  . diclofenac sodium (VOLTAREN) 1 % GEL Apply 2 g topically 4 (four) times daily.    Marland Kitchen dicyclomine (BENTYL) 10 MG capsule  Take 1 capsule (10 mg total) by mouth 3 (three) times daily before meals. 90 capsule 5  . esomeprazole (NEXIUM) 40 MG capsule Take 1 capsule (40 mg total) by mouth daily at 12 noon.    . fenofibrate 160 MG tablet Take 1 tablet (160 mg total) by mouth daily.  0  . fluticasone furoate-vilanterol (BREO ELLIPTA) 100-25 MCG/INH AEPB Inhale 1 puff into the lungs daily. 60 each 5  . furosemide (LASIX) 40 MG tablet Take 1 tablet (40 mg total) by mouth daily. 90 tablet 2  . glucose blood test strip Use to test blood sugar 4 times daily instructed. Dx: E11.59 125 each 5  . HUMULIN R 500 UNIT/ML injection INJECT 60 UNITS (0.12 ML) INTO THE SKIN THREE TIMES DAILY WITH MEALS 20 vial 2  . insulin NPH-regular Human (NOVOLIN 70/30) (70-30) 100 UNIT/ML injection Inject under skin 40 units in the morning and 60 units before dinner 30 mL 3  . insulin regular (NOVOLIN R) 100 units/mL injection Inject 0.2-0.36 mLs (20-36 Units total) into the skin 3 (three) times daily before meals. 40 mL 3  . Insulin Syringes, Disposable, U-100 1 ML MISC Use 4x a day 200 each 11  . isosorbide mononitrate (IMDUR) 60 MG 24 hr tablet Take 1 tablet (60 mg total) by mouth daily. 30 tablet 6  . meclizine (ANTIVERT) 25 MG tablet Take 1 tablet (25 mg total) by mouth 3 (three) times daily as needed for dizziness (vertigo). 30 tablet 0  . metoprolol (LOPRESSOR) 50 MG tablet Take 1 tablet (50 mg total) by mouth 2 (two) times daily. 60 tablet 6  . nitroGLYCERIN (NITROSTAT) 0.4 MG SL tablet Place 1 tablet (0.4 mg total) under the tongue every 5 (five) minutes as needed for chest pain (CP or SOB). 25 tablet 4  . pantoprazole (PROTONIX) 40 MG tablet Take 40 mg by mouth daily.  1  . Polyvinyl Alcohol-Povidone (REFRESH OP) Place 1 drop into both eyes daily as needed (for dry eyes). Reported on 12/25/2015    . potassium chloride SA (K-DUR,KLOR-CON) 20 MEQ tablet Take 1 tablet (20 mEq total) by mouth daily. 30 tablet 11  . prasugrel (EFFIENT) 10 MG TABS  tablet Take 1 tablet (10 mg total) by mouth daily. 56 tablet 0  . pravastatin (PRAVACHOL) 40 MG tablet Take 1 tablet (40 mg total) by mouth daily. 30 tablet 5  . propranolol (INDERAL) 40 MG tablet Take 1 tablet (40 mg total) by mouth 2 (two) times daily. 180 tablet 3  . ranolazine (RANEXA) 1000 MG SR tablet Take 1 tablet (1,000 mg total) by mouth 2 (two) times daily. 60 tablet 6  . VENTOLIN HFA 108 (90 BASE) MCG/ACT inhaler Inhale 2 puffs into the lungs every 6 (six) hours as needed. (SHORTNESS OR BREATH / WHEEZING)  0   No facility-administered medications prior to visit.      Allergies:  Review of patient's allergies indicates no known allergies.   Social History   Social History  . Marital status: Married    Spouse name: N/A  . Number of children: 0  . Years of education: N/A   Occupational History  . retired    Social History Main Topics  . Smoking status: Never Smoker  . Smokeless tobacco: Never Used  . Alcohol use No  . Drug use: No  . Sexual activity: No   Other Topics Concern  . None   Social History Narrative   He is a Geophysicist/field seismologist by trade.   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   He lives alone.  His wife is living in [place] at this time.  They do not have children.   Highest level of education:  College graduate     Family History:  The patient's family history includes Alzheimer's disease in his sister; Asthma in his brother; CAD in his brother and sister; Diabetes in his other; Prostate cancer in his brother.   ROS:   Please see the history of present illness.    New development is bilateral kidney stones. He also has cough, chronic back pain, muscle pain, easy bruising, shortness of breath, leg pain, recent chills, excessive swelling, excessive fatigue, occasional lower extremity cramping, wheezing, and anxiety.  All other systems reviewed and are negative.   PHYSICAL EXAM:   VS:  BP (!) 150/74   Pulse 80   Ht '5\' 7"'  (1.702  m)   Wt 267 lb 1.9 oz (121.2 kg)   SpO2 98%   BMI 41.84 kg/m    GEN: Well nourished, well developed, in no acute distress  HEENT: normal  Neck: no JVD, carotid bruits, or masses Cardiac: RRR; no murmurs, rubs, or gallops,no edema  Respiratory:  clear to auscultation bilaterally, normal work of breathing GI: soft, nontender, nondistended, + BS MS: no deformity or atrophy  Skin: warm and dry, no rash Neuro:  Alert and Oriented x 3, Strength and sensation are intact Psych: euthymic mood, full affect  Wt Readings from Last 3 Encounters:  06/14/16 267 lb 1.9 oz (121.2 kg)  06/13/16 268 lb (121.6 kg)  04/22/16 265 lb 12.8 oz (120.6 kg)      Studies/Labs Reviewed:   EKG:  EKG  Not performed  Recent Labs: 07/24/2015: Pro B Natriuretic peptide (BNP) 24.0 04/19/2016: ALT 14; B Natriuretic Peptide 24.7; Magnesium 1.8 04/22/2016: BUN 12; Creatinine, Ser 1.37; Hemoglobin 11.4; Platelets 218; Potassium 4.0; Sodium 137   Lipid Panel    Component Value Date/Time   CHOL 159 04/20/2016 0329   TRIG 106 04/20/2016 0329   HDL 31 (L) 04/20/2016 0329   CHOLHDL 5.1 04/20/2016 0329   VLDL 21 04/20/2016 0329   LDLCALC 107 (H) 04/20/2016 0329   LDLDIRECT 144.0 07/08/2015 1344    Additional studies/ records that were reviewed today include:  No new data    ASSESSMENT:    1. Coronary artery disease involving native coronary artery of native heart without angina pectoris   2. Essential hypertension   3. OSA (obstructive sleep apnea)   4. Dyslipidemia, goal LDL below 70   5. Morbid obesity due to excess calories ([place])   6. Preoperative clearance      PLAN:  In order of problems listed above:  1. Stable coronary artery disease with dramatic improvement in anginal quality symptoms with Ranexa. LAD stent was in April 2016. Dual antiplatelet therapy was restarted when he presented  in February 2017 with unstable angina related to circumflex progression. We'll plan continuation of dual  antiplatelet therapy for at least another 6 months and discontinue Effient at that time. Plan continued medical therapy of circumflex. 1. Low-salt diet is abdicated. Weight losses abdicated. Blood pressure is not as well-controlled as I would like. 2. Where C Pap 3. Continue statin therapy 6.   Cleared for urologic surgery if needed especially of procedure can be done on Effient. It'll be safer off Effient this would also be achievable since he is more than one year out from stent implantation.     Medication Adjustments/Labs and Tests Ordered: Current medicines are reviewed at length with the patient today.  Concerns regarding medicines are outlined above.  Medication changes, Labs and Tests ordered today are listed in the Patient Instructions below. Patient Instructions  Medication Instructions:   NO CHANGE  Follow-Up:  Your physician wants you to follow-up in: [place] will receive a reminder letter in the mail two months in advance. If you don't receive a letter, please call our office to schedule the follow-up appointment.   If you need a refill on your cardiac medications before your next appointment, please call your pharmacy.   CONTINUE TO STAY PHYSICALLY ACTIVE      Signed, Sinclair Grooms, MD  06/14/2016 9:47 AM    [facility] [address], [place], [place]  [address] Phone: [phone]; Fax: [phone]

## 2016-06-27 ENCOUNTER — Telehealth: Payer: Self-pay | Admitting: Family Medicine

## 2016-06-27 NOTE — Telephone Encounter (Signed)
Use the 11'oclock slot. Follow up.   KP

## 2016-06-27 NOTE — Telephone Encounter (Signed)
Pt sent mychart request for appt 07/05/16 approx 9:00am as his brother Lissa Merlin has appt at that time and is his only means of transportation. Pt did not note reason for appt. There is not a time slot available.

## 2016-06-27 NOTE — Telephone Encounter (Signed)
Notified pt to come in with Lissa Merlin at 9:00am and will be taken as soon as available.

## 2016-06-28 ENCOUNTER — Ambulatory Visit (INDEPENDENT_AMBULATORY_CARE_PROVIDER_SITE_OTHER): Payer: Medicare Other | Admitting: Pulmonary Disease

## 2016-06-28 ENCOUNTER — Encounter: Payer: Self-pay | Admitting: Pulmonary Disease

## 2016-06-28 VITALS — BP 136/72 | HR 67 | Ht 67.0 in | Wt 263.0 lb

## 2016-06-28 DIAGNOSIS — I200 Unstable angina: Secondary | ICD-10-CM

## 2016-06-28 DIAGNOSIS — R062 Wheezing: Secondary | ICD-10-CM | POA: Diagnosis not present

## 2016-06-28 DIAGNOSIS — J849 Interstitial pulmonary disease, unspecified: Secondary | ICD-10-CM

## 2016-06-28 DIAGNOSIS — R0600 Dyspnea, unspecified: Secondary | ICD-10-CM | POA: Diagnosis not present

## 2016-06-28 DIAGNOSIS — J4530 Mild persistent asthma, uncomplicated: Secondary | ICD-10-CM | POA: Diagnosis not present

## 2016-06-28 NOTE — Patient Instructions (Signed)
Elevate the head of your bed No eating within 3 hours of bedtime Take a Pepcid in the evenings Continue Breo daily Use albuterol as needed for shortness of breath We will order a CT scan of your chest from October 2017 We will order a lung function test for February 2018 and see you after that.

## 2016-06-28 NOTE — Assessment & Plan Note (Addendum)
This has improved dramatically.  He may have mild persistent asthma but I believe his cough and wheeze are more related to acid reflux related larnygeal irritation form GERD.  6 he had a CT scan in October 2016 which showed possible scarring related to nonspecific interstitial pneumonitis. Fortunately, his lung function testing has been stable so I think it's unlikely that he has an underlying interstitial lung disease.  Plan : Continue Breo for now Next visit back off to inhaled corticosteroid alone Continue as needed albuterol Repeat high-resolution CT scan of the chest in October 2017 Repeat lung function testing February 2018

## 2016-06-28 NOTE — Assessment & Plan Note (Signed)
As noted above I believe that his wheezing and cough are mostly due to laryngeal irritation from acid reflux as he notes persistent symptoms of acid reflux.  Plan: We again reviewed the importance of elevating the head of his bed and not eating within 3 hours of bedtime I recommended that he take nightly Pepcid

## 2016-06-28 NOTE — Progress Notes (Signed)
Subjective:    Patient ID: Edward [name], male    DOB: [birth year], 75 y.o.   MRN: [id number]  Synopsis: Referred in 2016 for dyspnea, has a history of asthma.  Never smoked. He has significant acid reflux. He also has coronary artery disease. 1/15 Hgb 12.10 July 2015 pulmonary function testing ratio 82%, FEV1 2.10 L (81% predicted), total lung capacity 4.21 L (63% predicted), DLCO 21.17 (71% predicted)  08/2015 HRCT> mild GGO, likley pulmonary edema and less likely NSIP; 3 vessel CAD noted, mild likley reactive lymphadenopathy June 2017 pulmonary function testing ratio 83%, FEV1 1.98 L (76% predicted), FVC 2.38 L (68% predicted), DLCO 20.47 (69% predicted), patient refused pleh  HPI Chief Complaint  Patient presents with  . Follow-up    Pt states overall his breathing is unchanged. Pt c/o intermittent dry cough and wheezing. Pt denies CP/tightness.    Edward [name] has ben exercising a lot lately and he has lost weight and he feels well.  He is taking effient and ranexa and no longer has chest pain so he can exercise more frequently.  He has a kidney stones which needs to be treated.  He no longer has chest pain.    He has been taking Breo for his asthma on a daily basis.  He uses ventolin only as needed for chest tightness.  No pneumonia or bad espides of bronchitis.  He has wheezing from time to time and a dry cough which he feels is coming from his throat.  The cough has not worsened.  It also comes and goes.  It is typically more of a problem in the mornings.    He notes acid reflux quite often despite the nexium.    Past Medical History:  Diagnosis Date  . Adenomatous colon polyp 04/2011  . CAD (coronary artery disease)    a. 01/2015 DES to LAD  b. 12/2015: [place] 85% oLCx lesion--> rx therapy.  . Chronic diastolic CHF (congestive heart failure) ([place])   . DM type 2 (diabetes mellitus, type 2), insulin dependent 01/29/2014   fasting cbg 50-120 with new regimen  . Dyslipidemia, goal LDL  below 70 01/29/2014  . Enlarged prostate   . Essential tremor    a. on proprnolol  . GERD (gastroesophageal reflux disease)   . Hypertension   . Internal hemorrhoid   . Sleep apnea    does not use cpap (06/04/2015)  . TIA (transient ischemic attack) 2002      Review of Systems  Constitutional: Positive for fatigue. Negative for chills and fever.  HENT: Negative for postnasal drip, rhinorrhea and sinus pressure.   Respiratory: Positive for cough. Negative for shortness of breath and wheezing.   Cardiovascular: Positive for leg swelling. Negative for chest pain and palpitations.       Objective:   Physical Exam Vitals:   06/28/16 1129  BP: 136/72  Pulse: 67  SpO2: 98%  Weight: 263 lb (119.3 kg)  Height: '5\' 7"'  (1.702 m)   RA   Gen: chronically ill appearing HENT: OP clear, TM's clear, neck supple PULM: CTA B, normal percussion CV: RRR, no mgr, trace edema GI: BS+, soft, nontender Derm: no cyanosis or rash Psyche: normal mood and affect  CBC    Component Value Date/Time   WBC 5.4 04/22/2016 0212   RBC 4.15 (L) 04/22/2016 0212   HGB 11.4 (L) 04/22/2016 0212   HCT 34.8 (L) 04/22/2016 0212   PLT 218 04/22/2016 0212   MCV 83.9 04/22/2016 0212  MCH 27.5 04/22/2016 0212   MCHC 32.8 04/22/2016 0212   RDW 13.0 04/22/2016 0212   LYMPHSABS 1.7 04/19/2016 2007   MONOABS 0.9 04/19/2016 2007   EOSABS 0.2 04/19/2016 2007   BASOSABS 0.0 04/19/2016 2007        Assessment & Plan:  Dyspnea This has improved dramatically.  He may have mild persistent asthma but I believe his cough and wheeze are more related to acid reflux related larnygeal irritation form GERD.  6 he had a CT scan in October 2016 which showed possible scarring related to nonspecific interstitial pneumonitis. Fortunately, his lung function testing has been stable so I think it's unlikely that he has an underlying interstitial lung disease.  Plan : Continue Breo for now Next visit back off to inhaled  corticosteroid alone Continue as needed albuterol Repeat high-resolution CT scan of the chest in October 2017 Repeat lung function testing February 2018  Wheezing As noted above I believe that his wheezing and cough are mostly due to laryngeal irritation from acid reflux as he notes persistent symptoms of acid reflux.  Plan: We again reviewed the importance of elevating the head of his bed and not eating within 3 hours of bedtime I recommended that he take nightly Pepcid  > 50% of time spent in a 27 minute visit was face to face  Current Outpatient Prescriptions:  .  amLODipine (NORVASC) 10 MG tablet, take 1 tablet by mouth once daily, Disp: 30 tablet, Rfl: 11 .  aspirin EC 81 MG tablet, Take 81 mg by mouth daily., Disp: , Rfl:  .  B-D INS SYR ULTRAFINE 1CC/31G 31G X 5/16" 1 ML MISC, inject three times a day AS INSTRUCTED, Disp: , Rfl: 1 .  BD INSULIN SYRINGE U-500 31G X 6MM 0.5 ML MISC, , Disp: , Rfl: 1 .  Blood Glucose Monitoring Suppl (ONE TOUCH ULTRA SYSTEM KIT) W/DEVICE KIT, Use to test blood sugar daily as instructed. Dx code: E11.59, Disp: 1 each, Rfl: 0 .  Ciclopirox 1 % shampoo, Apply 1 application topically at bedtime. At night after showering, Disp: , Rfl: 0 .  clonazePAM (KLONOPIN) 1 MG tablet, Take 1 tablet (1 mg total) by mouth at bedtime as needed for anxiety., Disp: 30 tablet, Rfl: 2 .  dextromethorphan-guaiFENesin (MUCINEX DM) 30-600 MG 12hr tablet, Take 1 tablet by mouth 2 (two) times daily. (Patient taking differently: Take 1 tablet by mouth 2 (two) times daily as needed. ), Disp: 30 tablet, Rfl: 0 .  dicyclomine (BENTYL) 10 MG capsule, Take 1 capsule (10 mg total) by mouth 3 (three) times daily before meals., Disp: 90 capsule, Rfl: 5 .  esomeprazole (NEXIUM) 40 MG capsule, Take 1 capsule (40 mg total) by mouth daily at 12 noon., Disp: , Rfl:  .  fenofibrate 160 MG tablet, Take 1 tablet (160 mg total) by mouth daily., Disp: , Rfl: 0 .  fluticasone furoate-vilanterol (BREO  ELLIPTA) 100-25 MCG/INH AEPB, Inhale 1 puff into the lungs daily., Disp: 60 each, Rfl: 5 .  furosemide (LASIX) 40 MG tablet, Take 1 tablet (40 mg total) by mouth daily., Disp: 90 tablet, Rfl: 2 .  glucose blood test strip, Use to test blood sugar 4 times daily instructed. Dx: E11.59, Disp: 125 each, Rfl: 5 .  HUMULIN R 500 UNIT/ML injection, INJECT 60 UNITS (0.12 ML) INTO THE SKIN THREE TIMES DAILY WITH MEALS, Disp: 20 vial, Rfl: 2 .  insulin NPH-regular Human (NOVOLIN 70/30) (70-30) 100 UNIT/ML injection, Inject under skin 40 units in  the morning and 60 units before dinner, Disp: 30 mL, Rfl: 3 .  insulin regular (NOVOLIN R) 100 units/mL injection, Inject 0.2-0.36 mLs (20-36 Units total) into the skin 3 (three) times daily before meals., Disp: 40 mL, Rfl: 3 .  Insulin Syringes, Disposable, U-100 1 ML MISC, Use 4x a day, Disp: 200 each, Rfl: 11 .  isosorbide mononitrate (IMDUR) 60 MG 24 hr tablet, Take 1 tablet (60 mg total) by mouth daily., Disp: 30 tablet, Rfl: 6 .  meclizine (ANTIVERT) 25 MG tablet, Take 1 tablet (25 mg total) by mouth 3 (three) times daily as needed for dizziness (vertigo)., Disp: 30 tablet, Rfl: 0 .  metoprolol (LOPRESSOR) 50 MG tablet, Take 1 tablet (50 mg total) by mouth 2 (two) times daily., Disp: 60 tablet, Rfl: 6 .  nitroGLYCERIN (NITROSTAT) 0.4 MG SL tablet, Place 1 tablet (0.4 mg total) under the tongue every 5 (five) minutes as needed for chest pain (CP or SOB)., Disp: 25 tablet, Rfl: 4 .  pantoprazole (PROTONIX) 40 MG tablet, Take 40 mg by mouth daily., Disp: , Rfl: 1 .  Polyvinyl Alcohol-Povidone (REFRESH OP), Place 1 drop into both eyes daily as needed (for dry eyes). Reported on 12/25/2015, Disp: , Rfl:  .  potassium chloride SA (K-DUR,KLOR-CON) 20 MEQ tablet, Take 1 tablet (20 mEq total) by mouth daily., Disp: 30 tablet, Rfl: 11 .  prasugrel (EFFIENT) 10 MG TABS tablet, Take 1 tablet (10 mg total) by mouth daily., Disp: 56 tablet, Rfl: 0 .  pravastatin (PRAVACHOL) 40  MG tablet, Take 1 tablet (40 mg total) by mouth daily., Disp: 30 tablet, Rfl: 5 .  propranolol (INDERAL) 40 MG tablet, Take 1 tablet (40 mg total) by mouth 2 (two) times daily., Disp: 180 tablet, Rfl: 3 .  ranolazine (RANEXA) 1000 MG SR tablet, Take 1 tablet (1,000 mg total) by mouth 2 (two) times daily., Disp: 60 tablet, Rfl: 6 .  VENTOLIN HFA 108 (90 BASE) MCG/ACT inhaler, Inhale 2 puffs into the lungs every 6 (six) hours as needed. (SHORTNESS OR BREATH / WHEEZING), Disp: , Rfl: 0 .  diclofenac sodium (VOLTAREN) 1 % GEL, Apply 2 g topically 4 (four) times daily., Disp: , Rfl:

## 2016-07-05 ENCOUNTER — Ambulatory Visit (INDEPENDENT_AMBULATORY_CARE_PROVIDER_SITE_OTHER): Payer: Medicare Other | Admitting: Family Medicine

## 2016-07-05 ENCOUNTER — Other Ambulatory Visit: Payer: Self-pay | Admitting: Internal Medicine

## 2016-07-05 ENCOUNTER — Encounter: Payer: Self-pay | Admitting: Family Medicine

## 2016-07-05 VITALS — BP 182/71 | HR 72 | Temp 97.9°F | Ht 67.0 in | Wt 278.4 lb

## 2016-07-05 DIAGNOSIS — R1010 Upper abdominal pain, unspecified: Secondary | ICD-10-CM

## 2016-07-05 DIAGNOSIS — E1065 Type 1 diabetes mellitus with hyperglycemia: Secondary | ICD-10-CM | POA: Diagnosis not present

## 2016-07-05 DIAGNOSIS — I200 Unstable angina: Secondary | ICD-10-CM

## 2016-07-05 DIAGNOSIS — E1159 Type 2 diabetes mellitus with other circulatory complications: Secondary | ICD-10-CM

## 2016-07-05 DIAGNOSIS — Z9861 Coronary angioplasty status: Secondary | ICD-10-CM | POA: Diagnosis not present

## 2016-07-05 DIAGNOSIS — E108 Type 1 diabetes mellitus with unspecified complications: Secondary | ICD-10-CM

## 2016-07-05 DIAGNOSIS — R81 Glycosuria: Secondary | ICD-10-CM

## 2016-07-05 DIAGNOSIS — I509 Heart failure, unspecified: Secondary | ICD-10-CM

## 2016-07-05 DIAGNOSIS — R10A0 Flank pain, unspecified side: Secondary | ICD-10-CM

## 2016-07-05 DIAGNOSIS — I10 Essential (primary) hypertension: Secondary | ICD-10-CM

## 2016-07-05 DIAGNOSIS — M545 Low back pain, unspecified: Secondary | ICD-10-CM

## 2016-07-05 DIAGNOSIS — E785 Hyperlipidemia, unspecified: Secondary | ICD-10-CM | POA: Diagnosis not present

## 2016-07-05 DIAGNOSIS — R319 Hematuria, unspecified: Secondary | ICD-10-CM | POA: Diagnosis not present

## 2016-07-05 DIAGNOSIS — I2510 Atherosclerotic heart disease of native coronary artery without angina pectoris: Secondary | ICD-10-CM | POA: Diagnosis not present

## 2016-07-05 DIAGNOSIS — K219 Gastro-esophageal reflux disease without esophagitis: Secondary | ICD-10-CM

## 2016-07-05 DIAGNOSIS — E876 Hypokalemia: Secondary | ICD-10-CM

## 2016-07-05 DIAGNOSIS — I25119 Atherosclerotic heart disease of native coronary artery with unspecified angina pectoris: Secondary | ICD-10-CM

## 2016-07-05 DIAGNOSIS — N200 Calculus of kidney: Secondary | ICD-10-CM | POA: Diagnosis not present

## 2016-07-05 DIAGNOSIS — IMO0002 Reserved for concepts with insufficient information to code with codable children: Secondary | ICD-10-CM

## 2016-07-05 DIAGNOSIS — R109 Unspecified abdominal pain: Secondary | ICD-10-CM

## 2016-07-05 DIAGNOSIS — Z794 Long term (current) use of insulin: Secondary | ICD-10-CM

## 2016-07-05 LAB — POC URINALSYSI DIPSTICK (AUTOMATED)
Bilirubin, UA: NEGATIVE
Ketones, UA: NEGATIVE
LEUKOCYTES UA: NEGATIVE
NITRITE UA: NEGATIVE
PH UA: 6
Spec Grav, UA: >=1.03
UROBILINOGEN UA: 2

## 2016-07-05 LAB — COMPREHENSIVE METABOLIC PANEL
ALT: 15 U/L (ref 0–53)
AST: 15 U/L (ref 0–37)
Albumin: 3.7 g/dL (ref 3.5–5.2)
Alkaline Phosphatase: 102 U/L (ref 39–117)
BILIRUBIN TOTAL: 0.4 mg/dL (ref 0.2–1.2)
BUN: 13 mg/dL (ref 6–23)
CALCIUM: 9.1 mg/dL (ref 8.4–10.5)
CO2: 32 meq/L (ref 19–32)
CREATININE: 1.14 mg/dL (ref 0.40–1.50)
Chloride: 103 mEq/L (ref 96–112)
GFR: 80.73 mL/min (ref >60.00)
GLUCOSE: 234 mg/dL — AB (ref 70–99)
Potassium: 4.1 mEq/L (ref 3.5–5.1)
Sodium: 138 mEq/L (ref 135–145)
TOTAL PROTEIN: 6.7 g/dL (ref 6.0–8.3)

## 2016-07-05 LAB — CBC WITH DIFFERENTIAL/PLATELET
BASOS PCT: 0.3 % (ref 0.0–3.0)
Basophils Absolute: 0 10*3/uL (ref 0.0–0.1)
EOS ABS: 0.2 10*3/uL (ref 0.0–0.7)
Eosinophils Relative: 2.7 % (ref 0.0–5.0)
HEMATOCRIT: 38.9 % — AB (ref 39.0–52.0)
Hemoglobin: 13.1 g/dL (ref 13.0–17.0)
LYMPHS ABS: 1.4 10*3/uL (ref 0.7–4.0)
LYMPHS PCT: 20.5 % (ref 12.0–46.0)
MCHC: 33.7 g/dL (ref 30.0–36.0)
MCV: 86.3 fl (ref 78.0–100.0)
MONO ABS: 0.7 10*3/uL (ref 0.1–1.0)
Monocytes Relative: 10.4 % (ref 3.0–12.0)
NEUTROS ABS: 4.6 10*3/uL (ref 1.4–7.7)
NEUTROS PCT: 66.1 % (ref 43.0–77.0)
PLATELETS: 263 10*3/uL (ref 150.0–400.0)
RBC: 4.51 Mil/uL (ref 4.22–5.81)
RDW: 14.7 % (ref 11.5–15.5)
WBC: 6.9 10*3/uL (ref 4.0–10.5)

## 2016-07-05 LAB — LIPID PANEL
CHOL/HDL RATIO: 4
Cholesterol: 189 mg/dL (ref 0–200)
HDL: 44.1 mg/dL (ref >39.00)
LDL Cholesterol: 110 mg/dL — ABNORMAL HIGH (ref 0–99)
NonHDL: 144.99
TRIGLYCERIDES: 175 mg/dL — AB (ref 0.0–149.0)
VLDL: 35 mg/dL (ref 0.0–40.0)

## 2016-07-05 LAB — MICROALBUMIN / CREATININE URINE RATIO
Creatinine,U: 159.2 mg/dL
MICROALB UR: 70.5 mg/dL — AB (ref 0.0–1.9)
Microalb Creat Ratio: 44.3 mg/g — ABNORMAL HIGH (ref 0.0–30.0)

## 2016-07-05 LAB — GLUCOSE, POCT (MANUAL RESULT ENTRY): POC GLUCOSE: 229 mg/dL — AB (ref 70–99)

## 2016-07-05 MED ORDER — HYDROCODONE-ACETAMINOPHEN 5-325 MG PO TABS: 1.0000 | ORAL_TABLET | Freq: Four times a day (QID) | ORAL | 0 refills | Status: DC | PRN

## 2016-07-05 MED ORDER — CICLOPIROX 1 % EX SHAM: 1.0000 "application " | MEDICATED_SHAMPOO | Freq: Every day | CUTANEOUS | 0 refills | Status: DC

## 2016-07-05 NOTE — Patient Instructions (Signed)
Kidney Stones °Kidney stones (urolithiasis) are deposits that form inside your kidneys. The intense pain is caused by the stone moving through the urinary tract. When the stone moves, the ureter goes into spasm around the stone. The stone is usually passed in the urine.  °CAUSES  °· A disorder that makes certain neck glands produce too much parathyroid hormone (primary hyperparathyroidism). °· A buildup of uric acid crystals, similar to gout in your joints. °· Narrowing (stricture) of the ureter. °· A kidney obstruction present at birth (congenital obstruction). °· Previous surgery on the kidney or ureters. °· Numerous kidney infections. °SYMPTOMS  °· Feeling sick to your stomach (nauseous). °· Throwing up (vomiting). °· Blood in the urine (hematuria). °· Pain that usually spreads (radiates) to the groin. °· Frequency or urgency of urination. °DIAGNOSIS  °· Taking a history and physical exam. °· Blood or urine tests. °· CT scan. °· Occasionally, an examination of the inside of the urinary bladder (cystoscopy) is performed. °TREATMENT  °· Observation. °· Increasing your fluid intake. °· Extracorporeal shock wave lithotripsy--This is a noninvasive procedure that uses shock waves to break up kidney stones. °· Surgery may be needed if you have severe pain or persistent obstruction. There are various surgical procedures. Most of the procedures are performed with the use of small instruments. Only small incisions are needed to accommodate these instruments, so recovery time is minimized. °The size, location, and chemical composition are all important variables that will determine the proper choice of action for you. Talk to your health care provider to better understand your situation so that you will minimize the risk of injury to yourself and your kidney.  °HOME CARE INSTRUCTIONS  °· Drink enough water and fluids to keep your urine clear or pale yellow. This will help you to pass the stone or stone fragments. °· Strain  all urine through the provided strainer. Keep all particulate matter and stones for your health care provider to see. The stone causing the pain may be as small as a grain of salt. It is very important to use the strainer each and every time you pass your urine. The collection of your stone will allow your health care provider to analyze it and verify that a stone has actually passed. The stone analysis will often identify what you can do to reduce the incidence of recurrences. °· Only take over-the-counter or prescription medicines for pain, discomfort, or fever as directed by your health care provider. °· Keep all follow-up visits as told by your health care provider. This is important. °· Get follow-up X-rays if required. The absence of pain does not always mean that the stone has passed. It may have only stopped moving. If the urine remains completely obstructed, it can cause loss of kidney function or even complete destruction of the kidney. It is your responsibility to make sure X-rays and follow-ups are completed. Ultrasounds of the kidney can show blockages and the status of the kidney. Ultrasounds are not associated with any radiation and can be performed easily in a matter of minutes. °· Make changes to your daily diet as told by your health care provider. You may be told to: °¨ Limit the amount of salt that you eat. °¨ Eat 5 or more servings of fruits and vegetables each day. °¨ Limit the amount of meat, poultry, fish, and eggs that you eat. °· Collect a 24-hour urine sample as told by your health care provider. You may need to collect another urine sample every 6-12   months. °SEEK MEDICAL CARE IF: °· You experience pain that is progressive and unresponsive to any pain medicine you have been prescribed. °SEEK IMMEDIATE MEDICAL CARE IF:  °· Pain cannot be controlled with the prescribed medicine. °· You have a fever or shaking chills. °· The severity or intensity of pain increases over 18 hours and is not  relieved by pain medicine. °· You develop a new onset of abdominal pain. °· You feel faint or pass out. °· You are unable to urinate. °  °This information is not intended to replace advice given to you by your health care provider. Make sure you discuss any questions you have with your health care provider. °  °Document Released: 10/17/2005 Document Revised: 07/08/2015 Document Reviewed: 03/20/2013 °Elsevier Interactive Patient Education ©2016 [employer] ° °

## 2016-07-05 NOTE — Progress Notes (Signed)
Patient ID: Edward [name], male    DOB: 1941-03-11  Age: 75 y.o. MRN: [id number]    Subjective:  Subjective  HPI Edward [name] presents for r flank pain-- kidney stones were found while he was in the hospital -- he has seen urology but pain has worsened.  He is asking [place] to call and get him in to see them. He has not had the money to afford his insulin so he has been off his insulin.  He will f/u with endo.    Review of Systems  Constitutional: Negative for appetite change, diaphoresis, fatigue and unexpected weight change.  Eyes: Negative for pain, redness and visual disturbance.  Respiratory: Negative for cough, chest tightness, shortness of breath and wheezing.   Cardiovascular: Negative for chest pain, palpitations and leg swelling.  Endocrine: Negative for cold intolerance, heat intolerance, polydipsia, polyphagia and polyuria.  Genitourinary: Negative for difficulty urinating, dysuria and frequency.  Neurological: Negative for dizziness, light-headedness, numbness and headaches.    History Past Medical History:  Diagnosis Date  . Adenomatous colon polyp 04/2011  . CAD (coronary artery disease)    a. 01/2015 DES to LAD  b. 12/2015: [place] 85% oLCx lesion--> rx therapy.  . Chronic diastolic CHF (congestive heart failure) ([place])   . DM type 2 (diabetes mellitus, type 2), insulin dependent 01/29/2014   fasting cbg 50-120 with new regimen  . Dyslipidemia, goal LDL below 70 01/29/2014  . Enlarged prostate   . Essential tremor    a. on proprnolol  . GERD (gastroesophageal reflux disease)   . Hypertension   . Internal hemorrhoid   . Sleep apnea    does not use cpap (06/04/2015)  . TIA (transient ischemic attack) 2002    He has a past surgical history that includes Total knee arthroplasty (Bilateral); Lumbar disc surgery; Cataract extraction (Bilateral); Joint replacement; Back surgery; Cataract extraction, bilateral (Bilateral); Shoulder arthroscopy (Right, 07/30/2014); left heart  catheterization with coronary angiogram (N/A, 01/28/2014); left heart catheterization with coronary angiogram (N/A, 01/30/2015); Cardiac catheterization (01/28/14); Coronary angioplasty with stent (01/30/2015); and Cardiac catheterization (N/A, 12/21/2015).   His family history includes Alzheimer's disease in his sister; Asthma in his brother; CAD in his brother and sister; Diabetes in his other; Prostate cancer in his brother.He reports that he has never smoked. He has never used smokeless tobacco. He reports that he does not drink alcohol or use drugs.  Current Outpatient Prescriptions on File Prior to Visit  Medication Sig Dispense Refill  . aspirin EC 81 MG tablet Take 81 mg by mouth daily.    . B-D INS SYR ULTRAFINE 1CC/31G 31G X 5/16" 1 ML MISC inject three times a day AS INSTRUCTED  1  . BD INSULIN SYRINGE U-500 31G X 6MM 0.5 ML MISC   1  . Blood Glucose Monitoring Suppl (ONE TOUCH ULTRA SYSTEM KIT) W/DEVICE KIT Use to test blood sugar daily as instructed. Dx code: E11.59 1 each 0  . clonazePAM (KLONOPIN) 1 MG tablet Take 1 tablet (1 mg total) by mouth at bedtime as needed for anxiety. 30 tablet 2  . dextromethorphan-guaiFENesin (MUCINEX DM) 30-600 MG 12hr tablet Take 1 tablet by mouth 2 (two) times daily. (Patient taking differently: Take 1 tablet by mouth 2 (two) times daily as needed. ) 30 tablet 0  . diclofenac sodium (VOLTAREN) 1 % GEL Apply 2 g topically 4 (four) times daily.    Marland Kitchen dicyclomine (BENTYL) 10 MG capsule Take 1 capsule (10 mg total) by mouth 3 (three)  times daily before meals. 90 capsule 5  . fluticasone furoate-vilanterol (BREO ELLIPTA) 100-25 MCG/INH AEPB Inhale 1 puff into the lungs daily. 60 each 5  . glucose blood test strip Use to test blood sugar 4 times daily instructed. Dx: E11.59 125 each 5  . HUMULIN R 500 UNIT/ML injection INJECT 60 UNITS (0.12 ML) INTO THE SKIN THREE TIMES DAILY WITH MEALS 20 vial 2  . insulin NPH-regular Human (NOVOLIN 70/30) (70-30) 100 UNIT/ML  injection Inject under skin 40 units in the morning and 60 units before dinner 30 mL 3  . insulin regular (NOVOLIN R) 100 units/mL injection Inject 0.2-0.36 mLs (20-36 Units total) into the skin 3 (three) times daily before meals. 40 mL 3  . Insulin Syringes, Disposable, U-100 1 ML MISC Use 4x a day 200 each 11  . isosorbide mononitrate (IMDUR) 60 MG 24 hr tablet Take 1 tablet (60 mg total) by mouth daily. 30 tablet 6  . meclizine (ANTIVERT) 25 MG tablet Take 1 tablet (25 mg total) by mouth 3 (three) times daily as needed for dizziness (vertigo). 30 tablet 0  . nitroGLYCERIN (NITROSTAT) 0.4 MG SL tablet Place 1 tablet (0.4 mg total) under the tongue every 5 (five) minutes as needed for chest pain (CP or SOB). 25 tablet 4  . pantoprazole (PROTONIX) 40 MG tablet Take 40 mg by mouth daily.  1  . Polyvinyl Alcohol-Povidone (REFRESH OP) Place 1 drop into both eyes daily as needed (for dry eyes). Reported on 12/25/2015    . prasugrel (EFFIENT) 10 MG TABS tablet Take 1 tablet (10 mg total) by mouth daily. 56 tablet 0  . propranolol (INDERAL) 40 MG tablet Take 1 tablet (40 mg total) by mouth 2 (two) times daily. 180 tablet 3  . ranolazine (RANEXA) 1000 MG SR tablet Take 1 tablet (1,000 mg total) by mouth 2 (two) times daily. 60 tablet 6  . VENTOLIN HFA 108 (90 BASE) MCG/ACT inhaler Inhale 2 puffs into the lungs every 6 (six) hours as needed. (SHORTNESS OR BREATH / WHEEZING)  0   No current facility-administered medications on file prior to visit.      Objective:  Objective  Physical Exam  Constitutional: He is oriented to person, place, and time. Vital signs are normal. He appears well-developed and well-nourished. He is sleeping.  HENT:  Head: Normocephalic and atraumatic.  Mouth/Throat: Oropharynx is clear and moist.  Eyes: EOM are normal. Pupils are equal, round, and reactive to light.  Neck: Normal range of motion. Neck supple. No thyromegaly present.  Cardiovascular: Normal rate and regular  rhythm.   No murmur heard. Pulmonary/Chest: Effort normal and breath sounds normal. No respiratory distress. He has no wheezes. He has no rales. He exhibits no tenderness.  Musculoskeletal: He exhibits no edema or tenderness.       Arms: Neurological: He is alert and oriented to person, place, and time.  Skin: Skin is warm and dry.  Psychiatric: He has a normal mood and affect. His behavior is normal. Judgment and thought content normal.  Nursing note and vitals reviewed.  BP (!) 182/71   Pulse 72   Temp 97.9 F (36.6 C) (Oral)   Ht '5\' 7"'  (1.702 m)   Wt 278 lb 6.4 oz (126.3 kg)   SpO2 99%   BMI 43.60 kg/m  Wt Readings from Last 3 Encounters:  07/05/16 278 lb 6.4 oz (126.3 kg)  06/28/16 263 lb (119.3 kg)  06/14/16 267 lb 1.9 oz (121.2 kg)  Lab Results  Component Value Date   WBC 6.9 07/05/2016   HGB 13.1 07/05/2016   HCT 38.9 (L) 07/05/2016   PLT 263.0 07/05/2016   GLUCOSE 234 (H) 07/05/2016   CHOL 189 07/05/2016   TRIG 175.0 (H) 07/05/2016   HDL 44.10 07/05/2016   LDLDIRECT 144.0 07/08/2015   LDLCALC 110 (H) 07/05/2016   ALT 15 07/05/2016   AST 15 07/05/2016   NA 138 07/05/2016   K 4.1 07/05/2016   CL 103 07/05/2016   CREATININE 1.14 07/05/2016   BUN 13 07/05/2016   CO2 32 07/05/2016   TSH 1.041 12/02/2014   PSA 0.74 02/11/2014   INR 1.10 12/21/2015   HGBA1C 9.4 06/13/2016   MICROALBUR 70.5 (H) 07/05/2016    Dg Chest 2 View  Result Date: 04/19/2016 CLINICAL DATA:  75 year old male with chest pain EXAM: CHEST  2 VIEW COMPARISON:  Chest radiograph dated 12/18/2015 FINDINGS: There is shallow inspiration. There bibasilar subsegmental atelectatic changes of the lungs. There is no focal consolidation, pleural effusion, or pneumothorax. Stable mild cardiomegaly. Coronary vascular stents noted. No acute osseous pathology. IMPRESSION: No active cardiopulmonary disease. Electronically Signed   By: Anner Crete M.D.   On: 04/19/2016 22:07   US Renal  Result  Date: 04/20/2016 CLINICAL DATA:  Right flank pain. EXAM: RENAL / URINARY TRACT ULTRASOUND COMPLETE COMPARISON:  Chest CT, 08/04/2015 FINDINGS: Right Kidney: Length: 11.3 cm. Normal parenchymal echogenicity. There are echogenic foci consistent with stones, largest from the lower pole measuring approximately 17 mm. No masses. No hydronephrosis. Left Kidney: Length: 12.2 cm. Normal parenchymal echogenicity. There are echogenic foci consistent with stones, largest from the upper pole measuring 11 mm, which was covered and present on prior chest CT. No masses. No hydronephrosis. Bladder: Appears normal for degree of bladder distention. IMPRESSION: 1. No acute finding.  No hydronephrosis. 2. Bilateral intrarenal stones. Electronically Signed   By: Lajean Manes M.D.   On: 04/20/2016 16:06     Assessment & Plan:  Plan  I have discontinued Mr. Berntsen's pravastatin. I have also changed his amLODipine. Additionally, I am having him start on HYDROcodone-acetaminophen. Lastly, I am having him maintain his aspirin EC, ONE TOUCH ULTRA SYSTEM KIT, Polyvinyl Alcohol-Povidone (REFRESH OP), Insulin Syringes (Disposable), VENTOLIN HFA, dextromethorphan-guaiFENesin, glucose blood, B-D INS SYR ULTRAFINE 1CC/31G, BD INSULIN SYRINGE U-500, diclofenac sodium, prasugrel, isosorbide mononitrate, fluticasone furoate-vilanterol, dicyclomine, propranolol, pantoprazole, nitroGLYCERIN, clonazePAM, ranolazine, meclizine, HUMULIN R, insulin NPH-regular Human, insulin regular, Ciclopirox, potassium chloride SA, metoprolol, furosemide, fenofibrate, and esomeprazole.  Meds ordered this encounter  Medications  . HYDROcodone-acetaminophen (NORCO/VICODIN) 5-325 MG tablet    Sig: Take 1 tablet by mouth every 6 (six) hours as needed for moderate pain.    Dispense:  20 tablet    Refill:  0  . Ciclopirox 1 % shampoo    Sig: Apply 1 application topically at bedtime. At night after showering    Dispense:  120 mL    Refill:  0  . DISCONTD:  pravastatin (PRAVACHOL) 40 MG tablet    Sig: Take 1 tablet (40 mg total) by mouth daily.    Dispense:  30 tablet    Refill:  5  . potassium chloride SA (K-DUR,KLOR-CON) 20 MEQ tablet    Sig: Take 1 tablet (20 mEq total) by mouth daily.    Dispense:  30 tablet    Refill:  11  . metoprolol (LOPRESSOR) 50 MG tablet    Sig: Take 1 tablet (50 mg total) by mouth 2 (two) times daily.  Dispense:  60 tablet    Refill:  6  . furosemide (LASIX) 40 MG tablet    Sig: Take 1 tablet (40 mg total) by mouth daily.    Dispense:  90 tablet    Refill:  2  . fenofibrate 160 MG tablet    Sig: Take 1 tablet (160 mg total) by mouth daily.    Refill:  0  . esomeprazole (NEXIUM) 40 MG capsule    Sig: Take 1 capsule (40 mg total) by mouth daily at 12 noon.  Marland Kitchen amLODipine (NORVASC) 10 MG tablet    Sig: Take 1 tablet (10 mg total) by mouth daily.    Dispense:  30 tablet    Refill:  11    Problem List Items Addressed This Visit      Unprioritized   GERD (gastroesophageal reflux disease)   Relevant Medications   esomeprazole (NEXIUM) 40 MG capsule   Hypokalemia   Relevant Medications   potassium chloride SA (K-DUR,KLOR-CON) 20 MEQ tablet   CAD (coronary artery disease)    Per cardiology      Relevant Medications   metoprolol (LOPRESSOR) 50 MG tablet   furosemide (LASIX) 40 MG tablet   fenofibrate 160 MG tablet   amLODipine (NORVASC) 10 MG tablet   Dyslipidemia, goal LDL below 70 (Chronic)    Check labs today On pravastatin      Relevant Medications   metoprolol (LOPRESSOR) 50 MG tablet   furosemide (LASIX) 40 MG tablet   fenofibrate 160 MG tablet   amLODipine (NORVASC) 10 MG tablet   Flank pain, acute    Hx kidney stones rx for pain med given to pt Refer to urology---- pt is already established but is asking [place] to call them for him for an appointment If pain inc -- go to ER      HTN (hypertension) (Chronic)    Elevated today-- pt has not taken all his meds today con't the meds Pt  has also been under some stress with his wife filing for divorce--- he is good with the decision but still struggles with it at times He is also in pain from kidney stone      Relevant Medications   metoprolol (LOPRESSOR) 50 MG tablet   furosemide (LASIX) 40 MG tablet   fenofibrate 160 MG tablet   amLODipine (NORVASC) 10 MG tablet   Other Relevant Orders   Microalbumin / creatinine urine ratio (Completed)   CBC with Differential/Platelet (Completed)   Type 2 diabetes mellitus with circulatory disorder, with long-term current use of insulin (HCC)    Per endo      Relevant Medications   metoprolol (LOPRESSOR) 50 MG tablet   furosemide (LASIX) 40 MG tablet   fenofibrate 160 MG tablet   amLODipine (NORVASC) 10 MG tablet    Other Visit Diagnoses    Right-sided low back pain without sciatica    -  Primary   Relevant Medications   HYDROcodone-acetaminophen (NORCO/VICODIN) 5-325 MG tablet   Other Relevant Orders   POCT Urinalysis Dipstick (Automated) (Completed)   Hematuria       Relevant Orders   Urine Culture   Ambulatory referral to Urology   Glucosuria       Relevant Orders   POCT Glucose (CBG) (Completed)   Kidney stones       Relevant Medications   HYDROcodone-acetaminophen (NORCO/VICODIN) 5-325 MG tablet   Other Relevant Orders   Ambulatory referral to Urology   Type I diabetes mellitus with complication, uncontrolled ([place])  Relevant Orders   Microalbumin / creatinine urine ratio (Completed)   POCT urinalysis dipstick   CBC with Differential/Platelet (Completed)   Comprehensive metabolic panel (Completed)   Hyperlipidemia LDL goal <70       Relevant Medications   metoprolol (LOPRESSOR) 50 MG tablet   furosemide (LASIX) 40 MG tablet   fenofibrate 160 MG tablet   amLODipine (NORVASC) 10 MG tablet   Other Relevant Orders   Lipid panel (Completed)   Hyperlipidemia       Relevant Medications   metoprolol (LOPRESSOR) 50 MG tablet   furosemide (LASIX) 40 MG  tablet   fenofibrate 160 MG tablet   amLODipine (NORVASC) 10 MG tablet   CAD S/P LAD DES 01/30/15       Relevant Medications   metoprolol (LOPRESSOR) 50 MG tablet   furosemide (LASIX) 40 MG tablet   fenofibrate 160 MG tablet   amLODipine (NORVASC) 10 MG tablet   Congestive heart failure, unspecified congestive heart failure chronicity, unspecified congestive heart failure type (HCC)       Relevant Medications   metoprolol (LOPRESSOR) 50 MG tablet   furosemide (LASIX) 40 MG tablet   fenofibrate 160 MG tablet   amLODipine (NORVASC) 10 MG tablet      Follow-up: Return in about 6 months (around 01/02/2017) for hypertension, hyperlipidemia.  Ann Held, DO

## 2016-07-05 NOTE — Progress Notes (Signed)
Pre visit review using our clinic review tool, if applicable. No additional management support is needed unless otherwise documented below in the visit note. 

## 2016-07-06 ENCOUNTER — Other Ambulatory Visit: Payer: Self-pay

## 2016-07-06 DIAGNOSIS — R109 Unspecified abdominal pain: Secondary | ICD-10-CM | POA: Insufficient documentation

## 2016-07-06 LAB — URINE CULTURE

## 2016-07-06 MED ORDER — FENOFIBRATE 160 MG PO TABS: 160.0000 mg | ORAL_TABLET | Freq: Every day | ORAL | 0 refills | Status: DC

## 2016-07-06 MED ORDER — FUROSEMIDE 40 MG PO TABS: 40.0000 mg | ORAL_TABLET | Freq: Every day | ORAL | 2 refills | Status: DC

## 2016-07-06 MED ORDER — ESOMEPRAZOLE MAGNESIUM 40 MG PO CPDR: 40.0000 mg | DELAYED_RELEASE_CAPSULE | Freq: Every day | ORAL | Status: DC

## 2016-07-06 MED ORDER — METOPROLOL TARTRATE 50 MG PO TABS: 50.0000 mg | ORAL_TABLET | Freq: Two times a day (BID) | ORAL | 6 refills | Status: DC

## 2016-07-06 MED ORDER — POTASSIUM CHLORIDE CRYS ER 20 MEQ PO TBCR: 20.0000 meq | EXTENDED_RELEASE_TABLET | Freq: Every day | ORAL | 11 refills | Status: DC

## 2016-07-06 MED ORDER — ATORVASTATIN CALCIUM 20 MG PO TABS: 20.0000 mg | ORAL_TABLET | Freq: Every day | ORAL | 2 refills | Status: DC

## 2016-07-06 MED ORDER — AMLODIPINE BESYLATE 10 MG PO TABS: 10.0000 mg | ORAL_TABLET | Freq: Every day | ORAL | 11 refills | Status: DC

## 2016-07-06 MED ORDER — PRAVASTATIN SODIUM 40 MG PO TABS: 40.0000 mg | ORAL_TABLET | Freq: Every day | ORAL | 5 refills | Status: DC

## 2016-07-06 NOTE — Assessment & Plan Note (Signed)
Check labs today On pravastatin

## 2016-07-06 NOTE — Assessment & Plan Note (Addendum)
Elevated today-- pt has not taken all his meds today con't the meds Pt has also been under some stress with his wife filing for divorce--- he is good with the decision but still struggles with it at times He is also in pain from kidney stone

## 2016-07-06 NOTE — Assessment & Plan Note (Signed)
Per endo °

## 2016-07-06 NOTE — Assessment & Plan Note (Signed)
Hx kidney stones rx for pain med given to pt Refer to urology---- pt is already established but is asking [place] to call them for him for an appointment If pain inc -- go to ER

## 2016-07-06 NOTE — Assessment & Plan Note (Signed)
Per cardiology 

## 2016-07-07 ENCOUNTER — Other Ambulatory Visit: Payer: Medicare Other

## 2016-07-07 ENCOUNTER — Other Ambulatory Visit: Payer: Self-pay

## 2016-07-07 DIAGNOSIS — R809 Proteinuria, unspecified: Secondary | ICD-10-CM

## 2016-07-07 NOTE — Progress Notes (Signed)
24 hour

## 2016-07-08 LAB — PROTEIN, URINE, 24 HOUR
Protein, 24H Urine: 555 mg/24 h — ABNORMAL HIGH (ref <150)
Protein, Urine: 37 mg/dL — ABNORMAL HIGH (ref 5–25)

## 2016-07-09 ENCOUNTER — Other Ambulatory Visit: Payer: Self-pay | Admitting: Family Medicine

## 2016-07-09 DIAGNOSIS — IMO0002 Reserved for concepts with insufficient information to code with codable children: Secondary | ICD-10-CM

## 2016-07-09 DIAGNOSIS — E1121 Type 2 diabetes mellitus with diabetic nephropathy: Secondary | ICD-10-CM

## 2016-07-09 DIAGNOSIS — E1165 Type 2 diabetes mellitus with hyperglycemia: Principal | ICD-10-CM

## 2016-07-09 DIAGNOSIS — Z794 Long term (current) use of insulin: Principal | ICD-10-CM

## 2016-07-11 ENCOUNTER — Other Ambulatory Visit: Payer: Self-pay

## 2016-07-11 DIAGNOSIS — R809 Proteinuria, unspecified: Secondary | ICD-10-CM

## 2016-07-14 ENCOUNTER — Encounter: Payer: Self-pay | Admitting: Neurology

## 2016-07-14 ENCOUNTER — Telehealth: Payer: Self-pay | Admitting: *Deleted

## 2016-07-14 DIAGNOSIS — N200 Calculus of kidney: Secondary | ICD-10-CM | POA: Diagnosis not present

## 2016-07-14 DIAGNOSIS — R1084 Generalized abdominal pain: Secondary | ICD-10-CM | POA: Diagnosis not present

## 2016-07-14 NOTE — Telephone Encounter (Signed)
Letter is ready.

## 2016-07-14 NOTE — Telephone Encounter (Signed)
Patient notified

## 2016-07-14 NOTE — Telephone Encounter (Signed)
Patient is calling about getting a letter about his penmanship.  His tremor is affecting his writing and he is having to type everything.  He is unable to fill out a form he needs done.

## 2016-07-14 NOTE — Telephone Encounter (Signed)
Note sent to Dr. Patel. 

## 2016-07-15 DIAGNOSIS — I739 Peripheral vascular disease, unspecified: Secondary | ICD-10-CM | POA: Diagnosis not present

## 2016-07-15 DIAGNOSIS — L84 Corns and callosities: Secondary | ICD-10-CM | POA: Diagnosis not present

## 2016-07-15 DIAGNOSIS — L603 Nail dystrophy: Secondary | ICD-10-CM | POA: Diagnosis not present

## 2016-07-15 DIAGNOSIS — E1151 Type 2 diabetes mellitus with diabetic peripheral angiopathy without gangrene: Secondary | ICD-10-CM | POA: Diagnosis not present

## 2016-07-20 ENCOUNTER — Ambulatory Visit (INDEPENDENT_AMBULATORY_CARE_PROVIDER_SITE_OTHER): Payer: Medicare Other | Admitting: Gastroenterology

## 2016-07-20 ENCOUNTER — Encounter: Payer: Self-pay | Admitting: Gastroenterology

## 2016-07-20 VITALS — BP 150/60 | HR 80 | Ht 67.0 in | Wt 279.0 lb

## 2016-07-20 DIAGNOSIS — I200 Unstable angina: Secondary | ICD-10-CM

## 2016-07-20 DIAGNOSIS — K219 Gastro-esophageal reflux disease without esophagitis: Secondary | ICD-10-CM

## 2016-07-20 DIAGNOSIS — M545 Low back pain, unspecified: Secondary | ICD-10-CM

## 2016-07-20 DIAGNOSIS — R109 Unspecified abdominal pain: Secondary | ICD-10-CM | POA: Diagnosis not present

## 2016-07-20 DIAGNOSIS — Z860101 Personal history of adenomatous and serrated colon polyps: Secondary | ICD-10-CM

## 2016-07-20 DIAGNOSIS — Z86010 Personal history of colonic polyps: Secondary | ICD-10-CM | POA: Diagnosis not present

## 2016-07-20 NOTE — Patient Instructions (Signed)
It has been recommended that you have a colonoscopy. Please call our office to schedule [phone].

## 2016-07-20 NOTE — Progress Notes (Signed)
    History of Present Illness: This is a 75 year old male complaining of right flank pain, right back pain. Relates severe pain in his right lower back, right hip, right flank with movement bending and sitting for the past few weeks. He was evaluated by his PCP on 9/5 who diagnosed right lower back pain without sciatica and prescribed hydrocodone. He has a history of kidney stones and a urology consult is pending as well. He has a history of irritable bowel syndrome with alternating diarrhea, constipation and abdominal bloating. He has a history of GERD. His constipation was temporarily worsened while taking hydrocodone. No other changes in GI symptoms.   Current Medications, Allergies, Past Medical History, Past Surgical History, Family History and Social History were reviewed in [employer] record.  Physical Exam: General: Well developed, well nourished, obese, uncomfortable, no acute distress Head: Normocephalic and atraumatic Eyes:  sclerae anicteric, EOMI Ears: Normal auditory acuity Mouth: No deformity or lesions Lungs: Clear throughout to auscultation Heart: Regular rate and rhythm; no murmurs, rubs or bruits Abdomen: Soft, non tender and non distended. No masses, hepatosplenomegaly or hernias noted. Normal Bowel sounds Back: Tenderness over right lumbar and right sacroiliac areas Musculoskeletal: Tenderness along lower right flank, right hip. Symmetrical with no gross deformities  Pulses:  Normal pulses noted Extremities: No clubbing, cyanosis, edema or deformities noted Neurological: Alert oriented x 4, grossly nonfocal Psychological:  Alert and cooperative. Anxious  Assessment and Recommendations:  1. Right flank pain, right lower back pain. This does not appear to be GI related. Musculoskeletal etiology is likely. He has app with Dr. Sharol Given on Friday.   2. IBS with frequent diarrhea and occasional constipation and this has not changed. Continue dicyclomine  10 mg 4 times a day as needed.  3. GERD. Continue Nexium 40 mg daily.  4. Small history of adenomatous colon polyps at his last colonoscopy in February 2012. He is overdue for a five-year surveillance colonoscopy and he would like to defer this a few weeks until his right flank and right back pain have improved. Advised him to call back within the next several weeks to schedule colonoscopy.

## 2016-07-22 ENCOUNTER — Telehealth: Payer: Self-pay | Admitting: Internal Medicine

## 2016-07-22 ENCOUNTER — Telehealth: Payer: Self-pay

## 2016-07-22 DIAGNOSIS — M545 Low back pain: Secondary | ICD-10-CM | POA: Diagnosis not present

## 2016-07-22 NOTE — Telephone Encounter (Signed)
novolin 70/30 is too expensive this month he needs help for just this month with this med  He is having a low financial month and is asking if we could just give him something to help with the cost due to being in the donut hole

## 2016-07-22 NOTE — Telephone Encounter (Signed)
Called and advised patient he could go to his [facility] and pick up the medication he was needing for $25. Advised patient to let us know if that works for him or not. Patient stated he would. No other questions at this time.

## 2016-07-28 ENCOUNTER — Encounter: Payer: Self-pay | Admitting: Neurology

## 2016-07-28 ENCOUNTER — Ambulatory Visit (INDEPENDENT_AMBULATORY_CARE_PROVIDER_SITE_OTHER): Payer: Medicare Other | Admitting: Neurology

## 2016-07-28 VITALS — BP 130/70 | HR 61 | Ht 67.0 in | Wt 275.1 lb

## 2016-07-28 DIAGNOSIS — E0842 Diabetes mellitus due to underlying condition with diabetic polyneuropathy: Secondary | ICD-10-CM | POA: Diagnosis not present

## 2016-07-28 DIAGNOSIS — G250 Essential tremor: Secondary | ICD-10-CM

## 2016-07-28 DIAGNOSIS — I200 Unstable angina: Secondary | ICD-10-CM

## 2016-07-28 MED ORDER — GABAPENTIN 300 MG PO CAPS: ORAL_CAPSULE | ORAL | 11 refills | Status: DC

## 2016-07-28 NOTE — Patient Instructions (Signed)
1.  Start gabapentin 300mg  at bedtime for one week, then increase to 1 tablet twice daily 2.  Continue propranolol 40mg  twice daily 3.  I will contact Dr. Tamala Julian and see if we can increase your propranolol and reduce your metoprolol 4.  Call my office in 3 weeks with an update  Return to clinic in 3 months

## 2016-07-28 NOTE — Progress Notes (Signed)
  Follow-up Visit   Date: 07/28/16    Edward [name] MRN: [id number] DOB: 02/14/1941   Interim History: Edward [name] is a 75 y.o. right-handed African American male with coronary artery disease, TIA (2002, manifested as right sided numbness), insulin dependent diabetes mellitus type 2, hypertension, hyperlipidemia, GERD and BPH returning to the clinic for follow-up of worsening essential tremor.  The patient was accompanied to the clinic by self.  History of present illness: He moved from [place] in February 2015 to be closer to his brother. He has been diabetic since 1985 and in late 1990s, he developed numbness > tingling of the feet which seems to have been stable since onset. He has paresthesias below the level of the ankles, worse on the right side, as well as interemittent numbness/tingling of the right hand. He ambulates independently. He was started neurontin 100mg twice daily.  He has previously been a patient of Dr. Nabeen Hussain in [place]. His last clinic note dated 08/14/2013, indicates that he was being seen for history of memory loss and headaches. Headaches improved significantly after he retired. He has short-term memory problems, which does not interefere with daily life. Mostly forgetting little details or tasks. He is driving and has not been involving in any accidents. He is able to do all his IADLs and ADLs.   UPDATE 12/02/2014:  He was last seen in June 2015 and has new complains of voice changes. He complains about bilateral hand tremor and voice change which is worse when he is stressed. His handwriting has become worse and he has more difficulty with fine motor tasks.  Tremors are predominately worse when trying to do tasks, but can be present when he is resting.  His sister, mother, and two brothers have tremors.  UPDATE 07/27/2015:  Since increasing his propranolol to 40mg twice daily, tremors somewhat improved.  UPDATE 01/04/2016:  He unexpectedly came to  the office requesting to be seen today even though his scheduled appointment with me is in 1 week.  His tremors have steadily worsened since his last visit.  Fine motor tasks such as measuring his blood glucose and using utensils is more and more challenging.  He was recently hospitalized for unstable angina and found to have left circumflex disease in addition to worsening of heart failure. He is being medically managed.   UPDATE 03/11/2016:  He reports feeling so much better since having his cardiac medications changed.  His tremors are also doing much better, and only get worse when anxious or stressed. He is feeling 98% well!  UPDATE 07/28/2016:  Patient scheduled sooner appointment because he feels that his tremors have become severe.  They are interfering with his daily motor tasks.  For instance, he was trying to deposit cash in the ATM yesterday and accidentally hit the wrong numbers that eventually his account was locked and the amount he placed was incorrect.  He was very embarrassed and needed to call a bank teller to help him and resolve the issue inside.  He has noticed that stress and anxiety always exacerbates his tremors.  He takes clonazepam 1mg for anxiety, which significantly calms him and helps with tremors.  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  He is also worried about episodic sharp and transient pain that his gets over his head.  CT head in June was unremarkable.   Medications:  Current Outpatient Prescriptions on File Prior to Visit    Medication Sig Dispense Refill  . amLODipine (NORVASC) 10 MG tablet Take 1 tablet (10 mg total) by mouth daily. 30 tablet 11  . aspirin EC 81 MG tablet Take 81 mg by mouth daily.    . atorvastatin (LIPITOR) 20 MG tablet Take 1 tablet (20 mg total) by mouth daily. 30 tablet 2  . B-D INS SYR ULTRAFINE 1CC/31G 31G X 5/16" 1 ML MISC inject three times a day AS INSTRUCTED  1  . B-D INS SYR ULTRAFINE  1CC/31G 31G X 5/16" 1 ML MISC inject three times a day AS INSTRUCTED 15 each 11  . BD INSULIN SYRINGE U-500 31G X 6MM 0.5 ML MISC   1  . Blood Glucose Monitoring Suppl (ONE TOUCH ULTRA SYSTEM KIT) W/DEVICE KIT Use to test blood sugar daily as instructed. Dx code: E11.59 1 each 0  . Ciclopirox 1 % shampoo Apply 1 application topically at bedtime. At night after showering 120 mL 0  . clonazePAM (KLONOPIN) 1 MG tablet Take 1 tablet (1 mg total) by mouth at bedtime as needed for anxiety. 30 tablet 2  . dextromethorphan-guaiFENesin (MUCINEX DM) 30-600 MG 12hr tablet Take 1 tablet by mouth 2 (two) times daily. (Patient taking differently: Take 1 tablet by mouth 2 (two) times daily as needed. ) 30 tablet 0  . diclofenac sodium (VOLTAREN) 1 % GEL Apply 2 g topically 4 (four) times daily.    . dicyclomine (BENTYL) 10 MG capsule Take 1 capsule (10 mg total) by mouth 3 (three) times daily before meals. 90 capsule 5  . esomeprazole (NEXIUM) 40 MG capsule Take 1 capsule (40 mg total) by mouth daily at 12 noon.    . fenofibrate 160 MG tablet Take 1 tablet (160 mg total) by mouth daily.  0  . fluticasone furoate-vilanterol (BREO ELLIPTA) 100-25 MCG/INH AEPB Inhale 1 puff into the lungs daily. 60 each 5  . furosemide (LASIX) 40 MG tablet Take 1 tablet (40 mg total) by mouth daily. 90 tablet 2  . glucose blood test strip Use to test blood sugar 4 times daily instructed. Dx: E11.59 125 each 5  . HUMULIN R 500 UNIT/ML injection INJECT 60 UNITS (0.12 ML) INTO THE SKIN THREE TIMES DAILY WITH MEALS 20 vial 2  . HYDROcodone-acetaminophen (NORCO/VICODIN) 5-325 MG tablet Take 1 tablet by mouth every 6 (six) hours as needed for moderate pain. 20 tablet 0  . insulin NPH-regular Human (NOVOLIN 70/30) (70-30) 100 UNIT/ML injection Inject under skin 40 units in the morning and 60 units before dinner 30 mL 3  . insulin regular (NOVOLIN R) 100 units/mL injection Inject 0.2-0.36 mLs (20-36 Units total) into the skin 3 (three) times  daily before meals. 40 mL 3  . Insulin Syringes, Disposable, U-100 1 ML MISC Use 4x a day 200 each 11  . isosorbide mononitrate (IMDUR) 60 MG 24 hr tablet Take 1 tablet (60 mg total) by mouth daily. 30 tablet 6  . meclizine (ANTIVERT) 25 MG tablet Take 1 tablet (25 mg total) by mouth 3 (three) times daily as needed for dizziness (vertigo). 30 tablet 0  . metoprolol (LOPRESSOR) 50 MG tablet Take 1 tablet (50 mg total) by mouth 2 (two) times daily. 60 tablet 6  . nitroGLYCERIN (NITROSTAT) 0.4 MG SL tablet Place 1 tablet (0.4 mg total) under the tongue every 5 (five) minutes as needed for chest pain (CP or SOB). 25 tablet 4  . pantoprazole (PROTONIX) 40 MG tablet Take 40 mg by mouth daily.  1  .   Polyvinyl Alcohol-Povidone (REFRESH OP) Place 1 drop into both eyes daily as needed (for dry eyes). Reported on 12/25/2015    . potassium chloride SA (K-DUR,KLOR-CON) 20 MEQ tablet Take 1 tablet (20 mEq total) by mouth daily. 30 tablet 11  . prasugrel (EFFIENT) 10 MG TABS tablet Take 1 tablet (10 mg total) by mouth daily. 56 tablet 0  . propranolol (INDERAL) 40 MG tablet Take 1 tablet (40 mg total) by mouth 2 (two) times daily. 180 tablet 3  . ranolazine (RANEXA) 1000 MG SR tablet Take 1 tablet (1,000 mg total) by mouth 2 (two) times daily. 60 tablet 6  . VENTOLIN HFA 108 (90 BASE) MCG/ACT inhaler Inhale 2 puffs into the lungs every 6 (six) hours as needed. (SHORTNESS OR BREATH / WHEEZING)  0   No current facility-administered medications on file prior to visit.     Allergies: No Known Allergies  Review of Systems:  CONSTITUTIONAL: No fevers, chills, night sweats, or weight loss.  EYES: No visual changes or eye pain ENT: No hearing changes.  No history of nose bleeds.   RESPIRATORY: No cough, wheezing and shortness of breath.   CARDIOVASCULAR: Negative for chest pain, and palpitations.   GI: Negative for abdominal discomfort, blood in stools or black stools.  No recent change in bowel habits.   GU:  No  history of incontinence.   MUSCLOSKELETAL: +history of joint pain or swelling.  No myalgias.   SKIN: Negative for lesions, rash, and itching.   ENDOCRINE: Negative for cold or heat intolerance, polydipsia or goiter.   PSYCH:  no depression or anxiety symptoms.   NEURO: As Above.   Vital Signs:  BP 130/70   Pulse 61   Ht 5' 7" (1.702 m)   Wt 275 lb 1 oz (124.8 kg)   SpO2 98%   BMI 43.08 kg/m   Neurological Exam: MENTAL STATUS including orientation to time, place, person, recent and remote memory, attention span and concentration, language, and fund of knowledge is normal.  Tremulous speech pattern, mild dysphonia.  No dysarthria.   CRANIAL NERVES:  Pupils are small and reactive to light.  Extraocular muscle intact.  Mild bilateral ptosis (old).  Face is symmetric.  Palate elevates symmetrically.  Tongue is midline.    MOTOR:  Motor strength is 5/5 in all extremities.  There is no rigidity.  No hand tremor with arms outstretching, but there is mild tremor with finger to nose testing.   COORDINATION/GAIT:    Gait narrow based and stable unassisted.  There is no bradykinesia with finger or toe tapping.   Data: EMG 12/01/2014: 1. 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 Overall, these findings are moderately severe in degree electrically.  2. 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 There is no evidence of a cervical/lumbosacral radiculopathy affecting the right side  Lab Results  Component Value Date   HGBA1C 9.4 06/13/2016   Labs 04/08/2014:  Copper 64*, ceruloplasmin 18, vitamin B12 290, SPEP/UPEP with IFE no M protein  CT head 10/21/2014 and 01/15/2015:  Negative  CT head wo contrast 04/21/2016:  Negative  IMPRESSION/PLAN: 1. Essential tremor involving the hands and voice, objectively stable on exam,  but patient reports they are periodically much worse.  He has been doing well on propranolol 40mg BID and would like to increase this; however, with his HR already in the low 60s   and him also on metoprolol 50mg BID, increasing his betablocker is not favored.  I will reach our to his cardiologist, Dr. Smith, to see if his metoprolol can be reduced in hopes of optimizing his propranolol for both blood pressure and tremor control Alternative options including Artane was discussed, but since he already complains of dry eyes and dry mouth, and anticholinergic side effects will only be exacerbated.  Avoid using primidone as it has the risk of reducing the concentration of Ranexa. Instead, I will start gabapentin 300mg at bedtime x 1 week, then increase 300mg BID He also takes clonazepam 1mg which is prescribed by his PCP for anxiety and has noticed this helps.  Going forward, I can start him on a maintenance dose of clonazepam, but I prefer to do this only as a last resort  2.  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   MG antibody negative  3.  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  EMG with moderately severe neuropathy affecting the right side.  Clinically stale.   Return to clinic in 6 months  The duration of this appointment visit was 30 minutes of face-to-face time with the patient.  Greater than 50% of this time was spent in counseling, explanation of diagnosis, planning of further management, and coordination of care.   Thank you for allowing me to participate in patient's care.  If I can answer any additional questions, I would be pleased to do so.    Sincerely,     K. , DO    

## 2016-08-03 ENCOUNTER — Telehealth: Payer: Self-pay | Admitting: Neurology

## 2016-08-03 DIAGNOSIS — N200 Calculus of kidney: Secondary | ICD-10-CM | POA: Diagnosis not present

## 2016-08-03 DIAGNOSIS — I129 Hypertensive chronic kidney disease with stage 1 through stage 4 chronic kidney disease, or unspecified chronic kidney disease: Secondary | ICD-10-CM | POA: Diagnosis not present

## 2016-08-03 DIAGNOSIS — N184 Chronic kidney disease, stage 4 (severe): Secondary | ICD-10-CM | POA: Diagnosis not present

## 2016-08-03 DIAGNOSIS — E1129 Type 2 diabetes mellitus with other diabetic kidney complication: Secondary | ICD-10-CM | POA: Diagnosis not present

## 2016-08-03 DIAGNOSIS — Z6841 Body Mass Index (BMI) 40.0 and over, adult: Secondary | ICD-10-CM | POA: Diagnosis not present

## 2016-08-03 MED ORDER — PROPRANOLOL HCL 40 MG PO TABS: 40.0000 mg | ORAL_TABLET | Freq: Three times a day (TID) | ORAL | 1 refills | Status: DC

## 2016-08-03 NOTE — Telephone Encounter (Signed)
Patient made aware.   Increased RX sent to pharmacy.

## 2016-08-03 NOTE — Telephone Encounter (Signed)
-----   Message from Belva Crome, MD sent at 07/28/2016  5:01 PM EDT ----- I am in agreement with uptitrating propranolol and decreasing metoprolol. It will be fine to decrease metoprolol to 25 mg twice daily. Depending on the eventual dose of propranolol, metoprolol may need to be discontinued. I would have no problem with this. ----- Message ----- From: Alda Berthold, DO Sent: 07/28/2016   1:54 PM To: Belva Crome, MD  Dr. Tamala Julian, I saw Edward [name] today for his essential tremors.  Unfortunately, they are getting worse.  He is having benefit with propranolol 40mg  BID.  He would like to increase the dose, but with his HR in the low 60s and him also on metoprolol for BP, I hesitate to do this.  Do you think it would be possible to reduce his metoprolol to allow increasing his propranolol to allow better control of his tremors?  I appreciate your thoughts and recommendations.  Sandrea Hammond

## 2016-08-03 NOTE — Telephone Encounter (Signed)
Please inform Mr. Sisk that after discussing it with his cardiologist, we can reduce his metoprolol to 25mg  twice daily and we can increase his propranolol to 40mg  three times daily to help with tremors.  He should monitor his blood pressure and heart rate at home and share with me and his cardiologist on his next clinic visit.  If HR is <60, he should contact us.   Kimesha Claxton K. Posey Pronto, DO

## 2016-08-09 ENCOUNTER — Inpatient Hospital Stay: Admission: RE | Admit: 2016-08-09 | Payer: Medicare Other | Source: Ambulatory Visit

## 2016-08-10 ENCOUNTER — Other Ambulatory Visit: Payer: Self-pay

## 2016-08-10 DIAGNOSIS — E1159 Type 2 diabetes mellitus with other circulatory complications: Secondary | ICD-10-CM

## 2016-08-10 DIAGNOSIS — Z794 Long term (current) use of insulin: Principal | ICD-10-CM

## 2016-08-10 MED ORDER — INSULIN REGULAR HUMAN 100 UNIT/ML IJ SOLN: 20.0000 [IU] | Freq: Three times a day (TID) | INTRAMUSCULAR | 2 refills | Status: DC

## 2016-08-10 MED ORDER — INSULIN NPH ISOPHANE & REGULAR (70-30) 100 UNIT/ML ~~LOC~~ SUSP: SUBCUTANEOUS | 1 refills | Status: DC

## 2016-08-11 ENCOUNTER — Telehealth: Payer: Self-pay | Admitting: Internal Medicine

## 2016-08-11 ENCOUNTER — Other Ambulatory Visit: Payer: Self-pay

## 2016-08-11 ENCOUNTER — Telehealth: Payer: Self-pay

## 2016-08-11 MED ORDER — INSULIN ASPART 100 UNIT/ML ~~LOC~~ SOLN: SUBCUTANEOUS | 3 refills | Status: DC

## 2016-08-11 NOTE — Telephone Encounter (Signed)
Same dose as the Novolin - please see table in his last AVS.

## 2016-08-11 NOTE — Telephone Encounter (Signed)
Sent in Novolog insulin with same dosage as Novolin R, as patient insurance did not cover Novolin R. Patient agreed to do this. Will monitor it goes through insurance.

## 2016-08-11 NOTE — Telephone Encounter (Signed)
Called patient and left message, asking him to return call to discuss if he would be okay to switch insulins. Patient got denied the Novolin R, but was approved for Novolog solution, and novolin 70/30. Dr.Gherghe questioning if he is okay to do the Novolog solution along with the 70/30 so we can send that in. Paper work on Catering manager.

## 2016-08-11 NOTE — Telephone Encounter (Signed)
Pt is ok with this please call in rx

## 2016-08-16 ENCOUNTER — Other Ambulatory Visit: Payer: Self-pay

## 2016-08-16 DIAGNOSIS — Z794 Long term (current) use of insulin: Principal | ICD-10-CM

## 2016-08-16 DIAGNOSIS — E1159 Type 2 diabetes mellitus with other circulatory complications: Secondary | ICD-10-CM

## 2016-08-16 DIAGNOSIS — N182 Chronic kidney disease, stage 2 (mild): Secondary | ICD-10-CM | POA: Diagnosis not present

## 2016-08-16 MED ORDER — INSULIN NPH ISOPHANE & REGULAR (70-30) 100 UNIT/ML ~~LOC~~ SUSP: SUBCUTANEOUS | 1 refills | Status: DC

## 2016-08-29 ENCOUNTER — Ambulatory Visit (INDEPENDENT_AMBULATORY_CARE_PROVIDER_SITE_OTHER)
Admission: RE | Admit: 2016-08-29 | Discharge: 2016-08-29 | Disposition: A | Discharge status: Home / Self Care | Payer: Medicare Other | Source: Ambulatory Visit | Attending: Pulmonary Disease | Admitting: Pulmonary Disease

## 2016-08-29 DIAGNOSIS — J849 Interstitial pulmonary disease, unspecified: Secondary | ICD-10-CM | POA: Diagnosis not present

## 2016-09-05 ENCOUNTER — Ambulatory Visit (INDEPENDENT_AMBULATORY_CARE_PROVIDER_SITE_OTHER): Payer: Medicare Other | Admitting: Ophthalmology

## 2016-09-13 ENCOUNTER — Encounter: Payer: Self-pay | Admitting: Gastroenterology

## 2016-09-13 ENCOUNTER — Encounter: Payer: Self-pay | Admitting: Internal Medicine

## 2016-09-13 ENCOUNTER — Ambulatory Visit (INDEPENDENT_AMBULATORY_CARE_PROVIDER_SITE_OTHER): Payer: Medicare Other | Admitting: Internal Medicine

## 2016-09-13 VITALS — BP 140/70 | HR 74 | Wt 274.0 lb

## 2016-09-13 DIAGNOSIS — Z794 Long term (current) use of insulin: Secondary | ICD-10-CM | POA: Diagnosis not present

## 2016-09-13 DIAGNOSIS — E1159 Type 2 diabetes mellitus with other circulatory complications: Secondary | ICD-10-CM | POA: Diagnosis not present

## 2016-09-13 DIAGNOSIS — I200 Unstable angina: Secondary | ICD-10-CM

## 2016-09-13 LAB — POCT GLYCOSYLATED HEMOGLOBIN (HGB A1C): HEMOGLOBIN A1C: 9.4

## 2016-09-13 MED ORDER — INSULIN NPH ISOPHANE & REGULAR (70-30) 100 UNIT/ML ~~LOC~~ SUSP: SUBCUTANEOUS | 1 refills | Status: DC

## 2016-09-13 NOTE — Progress Notes (Signed)
Patient ID: Edward [name], male   DOB: 1941/10/17, 75 y.o.   MRN: [id number]  HPI: Edward [name] is a 75 y.o.-year-old male, returning for f/u for DM2,  dx 1987, insulin-dependent since 1989, uncontrolled, with complications (CAD - s/p 2 AMIs, PN). He moved from [place] in 12/2013. Last visit 3 mo ago.  Pt was recently referred to nephrology b/c proteinuria (555 mg/24h). He was started on Avapro by Dr. Hollie Salk.   Last hemoglobin A1c was lower after starting U500 insulin, now increasing after switching back to U100 insulin:  Lab Results  Component Value Date   HGBA1C 9.4 06/13/2016   HGBA1C 9.0 01/18/2016   HGBA1C 8.9 10/19/2015   He was on: U500 insulin 30 min before a meal: - breakfast: 16-18 units  - lunch: 16-18 units  - dinner: 14-16 units  U500 sliding scale: 150-200: + 1 unit 201-250: + 2 units 251-300: + 3 units 301-350: + 4 units >350: + 5 units  He had to go back to Novolon 70/30 >> could not restart U500 -  he cannot afford it.  Now on: - he has not been taking the Novolog as it is more expensive for him. He increased the 70/30 insulin by himself:  Insulin  Before breakfast  Before lunch  Before dinner   Novolin 70/30 (vial) 70 (instead of 40) 70 70 (instead of 60)  Novolog  He was on Metformin >> N/V/D.  Pt checks his sugars 3-4 a day (no log): - am: 125, 156-200, 265 >> 110, 120-130, 234 >> 100-140 >> 68, 110-115 >> 170s >> 132, 140-160 - 2h after b'fast:200 >> 78 x1, 209-337 >> 128-263, 380 >> 170-180 >> 120-125 >> n/c - before lunch: 200 >> 125,135-169, 212 >> 170s >> 130-200 >> 170-180 >> 130-140 >> 140-180 - 2h after lunch: n/c >> 222, 375, 545 >> 198-309 >> 121-254 >> 170-180s >> n/c >> n/c - before dinner: 123, 134-212 >> 150-170 >> 150-200 >> 150-160, 200 >> 180-190 >> 100-110 - 2h after dinner: n/c >> 301-379>> 99, 128, 368 >> n/c >> 280 >> n/c - bedtime:190-463 >> 46, 284 >> <200 >> 190-200 >> 93-198 >> n/c >> 160s >> n/c >> 140-165 - nighttime: n/c >> 300s >>  139-339 >> n/c >> 52 x2 >> 83 >> occas. 87 Lowest sugar was 60 >> 60 x1; he has hypoglycemia awareness at 90. Highest sugar was 250.  Has a One Touch Ultra meter.   - + CKD, last BUN/creatinine:  Lab Results  Component Value Date   BUN 13 07/05/2016   CREATININE 1.14 07/05/2016  - Has HL. Last set of lipids: Lab Results  Component Value Date   CHOL 189 07/05/2016   HDL 44.10 07/05/2016   LDLCALC 110 (H) 07/05/2016   LDLDIRECT 144.0 07/08/2015   TRIG 175.0 (H) 07/05/2016   CHOLHDL 4 07/05/2016  Not on a statin. He gets leg cramps from Lipitor >> cannot tolerate it. - last eye exam was in 02/29/2016 (Dr Zigmund Daniel). Has DR. Had cataracts sx.  - + numbness and tingling in his feet. He saw podiatrist (05/2015) >> has PN. On ASA 81.  Pt was admitted with CP 12/18/2015 >> had another AMI. He was started on Ranexa and Effient, his dose of Imdur was increased. Plan is to continue medical tx.  I reviewed pt's medications, allergies, PMH, social hx, family hx, and changes were documented in the history of present illness. Otherwise, unchanged from my initial visit note, except he started  Effient.  ROS: Constitutional: + weight loss, +  fatigue, no hot flushes, + nocturia Eyes: + blurry vision, no xerophthalmia ENT: no sore throat, no nodules palpated in throat, no dysphagia/odynophagia, no hoarseness Cardiovascular: less CP/+ SOB/no palpitations/+ leg swelling Respiratory: no cough/+ SOB/+ wheezing Gastrointestinal: no N/V/+ D/no C/heartburn Musculoskeletal: + muscle aches/+ joint aches  Skin: no rash Neurological: + tremors/no numbness/tingling/dizziness  PE: BP 140/70   Pulse 74   Wt 274 lb (124.3 kg)   SpO2 98%   BMI 42.91 kg/m  Body mass index is 42.91 kg/m. Wt Readings from Last 3 Encounters:  09/13/16 274 lb (124.3 kg)  07/28/16 275 lb 1 oz (124.8 kg)  07/20/16 279 lb (126.6 kg)   Constitutional: obese, in NAD Eyes: PERRLA, EOMI, no exophthalmos ENT: moist mucous  membranes, no thyromegaly, no cervical lymphadenopathy Cardiovascular: RRR, No MRG, + B pitting leg swelling  Respiratory: CTA B Gastrointestinal: abdomen soft, NT, ND, BS+ Musculoskeletal: no deformities, strength intact in all 4 Skin: moist, warm, no rashes Neurological: no tremor with outstretched hands, DTR normal in all 4  ASSESSMENT: 1. DM2, insulin-dependent, uncontrolled, with complications - CAD H/o steroid inj's  Cardiologist: Dr Tamala Julian. Pulmonologist: Dr Lake Bells  PLAN:  1. Patient with long-standing, uncontrolled diabetes, previously on U500 insulin, now back on Novolin 70/30 due to finances. He also was on Novolin to supplement the 70/30 premixed insulin, however, his insurance decided to cover NovoLog, and we changed to this since last visit. He ended up stopping the NovoLog lately, because this was still too expensive for him. Compensatorily, he increased his 70/30 doses and also the frequency, taken it 3 times a day. - His sugars are slightly better, but still high in a.m. and midday. We will increase his 70/30 insulin with breakfast. He had a lower blood sugar at night, therefore, we will try to reduce the 70/30 insulin dose with dinner. HbA1c today: 9.4% (stable, high). However, this is higher then expected from log, therefore, I will check a fructosamine today. - I suggested to:  Patient Instructions  Please continue: Insulin  Before breakfast  Before lunch  Before dinner   Novolin 70/30 (vial) 70 >> 80 70 70 >> 60   Please stop at the lab.  Please return in 3 months with your sugar log.   - continue checking sugars at different times of the day - check 3-4 times a day, rotating checks - up to date with yearly eye exams  - refuses flu shot today - Return to clinic in 3 mo with sugar log   Office Visit on 09/13/2016  Component Date Value Ref Range Status  . Hemoglobin A1C 09/13/2016 9.4   Final  . Fructosamine 09/15/2016 309* 190 - 270 umol/L Final   HbA1c  calculated from fructosamine is actually 6.86%, which is perfect!  Philemon Kingdom, MD PhD [facility] Endocrinology

## 2016-09-13 NOTE — Patient Instructions (Addendum)
Please continue: Insulin  Before breakfast  Before lunch  Before dinner   Novolin 70/30 (vial) 70 >> 80 70 70 >> 60   Please stop at the lab.  Please return in 3 months with your sugar log.

## 2016-09-15 LAB — FRUCTOSAMINE: Fructosamine: 309 umol/L — ABNORMAL HIGH (ref 190–270)

## 2016-09-16 ENCOUNTER — Ambulatory Visit: Payer: Medicare Other | Admitting: Neurology

## 2016-09-20 ENCOUNTER — Other Ambulatory Visit: Payer: Self-pay | Admitting: Interventional Cardiology

## 2016-09-20 DIAGNOSIS — G4733 Obstructive sleep apnea (adult) (pediatric): Secondary | ICD-10-CM

## 2016-09-20 DIAGNOSIS — I2510 Atherosclerotic heart disease of native coronary artery without angina pectoris: Secondary | ICD-10-CM

## 2016-09-20 DIAGNOSIS — I10 Essential (primary) hypertension: Secondary | ICD-10-CM

## 2016-09-20 DIAGNOSIS — Z9861 Coronary angioplasty status: Principal | ICD-10-CM

## 2016-09-20 DIAGNOSIS — E785 Hyperlipidemia, unspecified: Secondary | ICD-10-CM

## 2016-09-20 DIAGNOSIS — R0600 Dyspnea, unspecified: Secondary | ICD-10-CM

## 2016-09-24 ENCOUNTER — Encounter: Payer: Self-pay | Admitting: Internal Medicine

## 2016-09-26 ENCOUNTER — Other Ambulatory Visit: Payer: Self-pay | Admitting: Internal Medicine

## 2016-09-26 MED ORDER — GLUCOSE BLOOD VI STRP: ORAL_STRIP | 3 refills | Status: DC

## 2016-09-27 NOTE — Telephone Encounter (Signed)
Patient stated he need Dr Barton Fanny out a form for him  to get his test strips, didn't know what kind of form it is.

## 2016-09-28 ENCOUNTER — Encounter: Payer: Self-pay | Admitting: Neurology

## 2016-09-28 ENCOUNTER — Ambulatory Visit (INDEPENDENT_AMBULATORY_CARE_PROVIDER_SITE_OTHER): Payer: Medicare Other | Admitting: Neurology

## 2016-09-28 VITALS — BP 120/78 | HR 70 | Ht 67.0 in | Wt 277.4 lb

## 2016-09-28 DIAGNOSIS — I200 Unstable angina: Secondary | ICD-10-CM

## 2016-09-28 DIAGNOSIS — G250 Essential tremor: Secondary | ICD-10-CM | POA: Diagnosis not present

## 2016-09-28 DIAGNOSIS — F419 Anxiety disorder, unspecified: Secondary | ICD-10-CM

## 2016-09-28 DIAGNOSIS — E0842 Diabetes mellitus due to underlying condition with diabetic polyneuropathy: Secondary | ICD-10-CM

## 2016-09-28 MED ORDER — CLONAZEPAM 0.5 MG PO TABS: 0.5000 mg | ORAL_TABLET | Freq: Two times a day (BID) | ORAL | 3 refills | Status: DC | PRN

## 2016-09-28 NOTE — Progress Notes (Signed)
Follow-up Visit   Date: 09/28/16    Edward [name] MRN: [id number] DOB: February 26, 1941   Interim History: Edward [name] is a 75 y.o. right-handed African American male with coronary artery disease, TIA (2002, manifested as right sided numbness), insulin dependent diabetes mellitus type 2, hypertension, hyperlipidemia, GERD and BPH returning to the clinic for follow-up of worsening essential tremor.  The patient was accompanied to the clinic by self.  History of present illness: He moved from [place] in February 2015 to be closer to his brother. He has been diabetic since 1985 and in late 1990s, he developed numbness > tingling of the feet which seems to have been stable since onset. He has paresthesias below the level of the ankles, worse on the right side, as well as interemittent numbness/tingling of the right hand. He ambulates independently. He was started neurontin '100mg'$  twice daily.  He was seeing Dr. Gery Pray in [place] for memory loss and headaches in 2014. Headaches improved significantly after he retired. He has short-term memory problems, which does not interefere with daily life. Mostly forgetting little details or tasks. He is driving and has not been involving in any accidents. He is able to do all his IADLs and ADLs.   UPDATE 12/02/2014:  He was last seen in June 2015 and has new complains of voice changes. He complains about bilateral hand tremor and voice change which is worse when he is stressed. His handwriting has become worse and he has more difficulty with fine motor tasks.  Tremors are predominately worse when trying to do tasks, but can be present when he is resting.  His sister, mother, and two brothers have tremors.  He was started on propranolol '40mg'$  BID which helped some.   UPDATE 07/28/2016:  Patient scheduled sooner appointment because he feels that his tremors have become severe.  They are interfering with his daily motor tasks.  For instance, he was  trying to deposit cash in the ATM yesterday and accidentally hit the wrong numbers that eventually his account was locked and the amount he placed was incorrect.  He was very embarrassed and needed to call a bank teller to help him and resolve the issue inside.  He has noticed that stress and anxiety always exacerbates his tremors.  He takes clonazepam '1mg'$  for anxiety, which significantly calms him and helps with tremors.  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    UPDATE 09/28/2016:   He scheduled a sooner appointment because he continues to have problems with his tremors.  He noticed some improvement with gabapentin '300mg'$  twice daily.  He feels the greatest benefit with clonazepam, which he takes for anxiety, and is requesting to take this for tremors.   He was able to reduce his metoprolol to '25mg'$  and therefore, able to increase propranolol to '40mg'$  TID which has helped.    Medications:  Current Outpatient Prescriptions on File Prior to Visit  Medication Sig Dispense Refill  . amLODipine (NORVASC) 10 MG tablet Take 1 tablet (10 mg total) by mouth daily. 30 tablet 11  . aspirin EC 81 MG tablet Take 81 mg by mouth daily.    Marland Kitchen atorvastatin (LIPITOR) 20 MG tablet Take 1 tablet (20 mg total) by mouth daily. 30 tablet 2  . B-D INS SYR ULTRAFINE 1CC/31G 31G X 5/16" 1 ML MISC inject three times a day AS INSTRUCTED  1  . BD INSULIN SYRINGE U-500  31G X 6MM 0.5 ML MISC   1  . Blood Glucose Monitoring Suppl (ONE TOUCH ULTRA SYSTEM KIT) W/DEVICE KIT Use to test blood sugar daily as instructed. Dx code: E11.59 1 each 0  . Ciclopirox 1 % shampoo Apply 1 application topically at bedtime. At night after showering 120 mL 0  . diclofenac sodium (VOLTAREN) 1 % GEL Apply 2 g topically 4 (four) times daily.    Marland Kitchen dicyclomine (BENTYL) 10 MG capsule Take 1 capsule (10 mg total) by mouth 3 (three) times daily before meals. 90 capsule 5  . EFFIENT 10 MG TABS tablet take 1  tablet by mouth once daily 30 tablet 2  . esomeprazole (NEXIUM) 40 MG capsule Take 1 capsule (40 mg total) by mouth daily at 12 noon.    . fenofibrate 160 MG tablet Take 1 tablet (160 mg total) by mouth daily.  0  . fluticasone furoate-vilanterol (BREO ELLIPTA) 100-25 MCG/INH AEPB Inhale 1 puff into the lungs daily. 60 each 5  . furosemide (LASIX) 40 MG tablet Take 1 tablet (40 mg total) by mouth daily. 90 tablet 2  . gabapentin (NEURONTIN) 300 MG capsule Take 1 tablet at bedtime for one week then increase to 1 tablet twice daily 60 capsule 11  . glucose blood test strip Use to test blood sugar 4 times daily instructed. Dx: E11.59 300 each 3  . HYDROcodone-acetaminophen (NORCO/VICODIN) 5-325 MG tablet Take 1 tablet by mouth every 6 (six) hours as needed for moderate pain. 20 tablet 0  . insulin NPH-regular Human (NOVOLIN 70/30) (70-30) 100 UNIT/ML injection Inject under skin 70 units 3x a day 90 mL 1  . Insulin Syringes, Disposable, U-100 1 ML MISC Use 4x a day 200 each 11  . isosorbide mononitrate (IMDUR) 60 MG 24 hr tablet Take 1 tablet (60 mg total) by mouth daily. 30 tablet 6  . meclizine (ANTIVERT) 25 MG tablet Take 1 tablet (25 mg total) by mouth 3 (three) times daily as needed for dizziness (vertigo). 30 tablet 0  . metoprolol (LOPRESSOR) 50 MG tablet Take 1 tablet (50 mg total) by mouth 2 (two) times daily. 60 tablet 6  . nitroGLYCERIN (NITROSTAT) 0.4 MG SL tablet Place 1 tablet (0.4 mg total) under the tongue every 5 (five) minutes as needed for chest pain (CP or SOB). 25 tablet 4  . pantoprazole (PROTONIX) 40 MG tablet Take 40 mg by mouth daily.  1  . Polyvinyl Alcohol-Povidone (REFRESH OP) Place 1 drop into both eyes daily as needed (for dry eyes). Reported on 12/25/2015    . potassium chloride SA (K-DUR,KLOR-CON) 20 MEQ tablet Take 1 tablet (20 mEq total) by mouth daily. 30 tablet 11  . propranolol (INDERAL) 40 MG tablet Take 1 tablet (40 mg total) by mouth 3 (three) times daily. 270  tablet 1  . ranolazine (RANEXA) 1000 MG SR tablet Take 1 tablet (1,000 mg total) by mouth 2 (two) times daily. 60 tablet 6  . VENTOLIN HFA 108 (90 BASE) MCG/ACT inhaler Inhale 2 puffs into the lungs every 6 (six) hours as needed. (SHORTNESS OR BREATH / WHEEZING)  0   No current facility-administered medications on file prior to visit.     Allergies: No Known Allergies  Review of Systems:  CONSTITUTIONAL: No fevers, chills, night sweats, or weight loss.  EYES: No visual changes or eye pain ENT: No hearing changes.  No history of nose bleeds.   RESPIRATORY: No cough, wheezing and shortness of breath.   CARDIOVASCULAR: Negative for  chest pain, and palpitations.   GI: Negative for abdominal discomfort, blood in stools or black stools.  No recent change in bowel habits.   GU:  No history of incontinence.   MUSCLOSKELETAL: +history of joint pain or swelling.  No myalgias.   SKIN: Negative for lesions, rash, and itching.   ENDOCRINE: Negative for cold or heat intolerance, polydipsia or goiter.   PSYCH:  no depression or anxiety symptoms.   NEURO: As Above.   Vital Signs:  BP 120/78   Pulse 70   Ht '5\' 7"'$  (1.702 m)   Wt 277 lb 7 oz (125.8 kg)   SpO2 96%   BMI 43.45 kg/m   Neurological Exam: MENTAL STATUS including orientation to time, place, person, recent and remote memory, attention span and concentration, language, and fund of knowledge is normal.  Tremulous speech pattern, mild dysphonia.  No dysarthria.   CRANIAL NERVES:  Pupils are small and reactive to light.  Extraocular muscle intact.  Mild bilateral ptosis (old).  Face is symmetric.  Palate elevates symmetrically.  Tongue is midline.    MOTOR:  Motor strength is 5/5 in all extremities.  There is no rigidity.  No hand tremor with arms outstretching, but there is mild tremor with finger to nose testing.   COORDINATION/GAIT:    Gait narrow based and stable unassisted.  There is no bradykinesia with finger or toe tapping.    Data: EMG 12/01/2014: 1. 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 Overall, these findings are moderately severe in degree electrically.  2. 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 There is no evidence of a cervical/lumbosacral radiculopathy affecting the right side  Lab Results  Component Value Date   HGBA1C 9.4 09/13/2016   Labs 04/08/2014:  Copper 64*, ceruloplasmin 18, vitamin B12 290, SPEP/UPEP with IFE no M protein  CT head 10/21/2014 and 01/15/2015:  Negative  CT head wo contrast 04/21/2016:  Negative  IMPRESSION/PLAN: 1. Essential tremor involving the hands and voice, objectively stable on exam, but patient reports they are periodically much worse especially when stressed Continue propranolol '40mg'$  TID.  He was able to taper his metoprolol '25mg'$  daily, which allowed for optimization of his propranolol.  If he is able to come off his metoprolol completely, we may increase his propranolol to '60mg'$  three times daily, if needed. Continue gabapentin '300mg'$  twice daily Start clonazepam 0.'5mg'$  twice daily as needed for tremors  - stressed the importance of using this only as needed.    Alternative options including Artane was discussed, but since he already complains of dry eyes and dry mouth, and anticholinergic side effects will only be exacerbated.  Avoid using primidone as it has the risk of reducing the concentration of Ranexa.  2.  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   MG antibody negative  3.  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  EMG with moderately severe neuropathy affecting the right side.  Clinically stale.   Return to clinic in 6 months  The duration of this appointment visit was 30 minutes of face-to-face time with the patient.  Greater than  50% of this time was spent in counseling, explanation of diagnosis, planning of further management, and coordination of care.   Thank you for allowing me to participate in patient's care.  If I can answer any additional questions, I would be pleased to  do so.    Sincerely,    Azya Barbero K. Posey Pronto, DO

## 2016-09-28 NOTE — Patient Instructions (Signed)
1.  Continue propranolol 40mg  three times daily 2.  Continue gabapentin 300mg  twice daily 3.  Prescription provided for clonazepam 0.5mg  twice daily as needed only for tremors  Return to clinic in 6 months

## 2016-10-03 ENCOUNTER — Encounter: Payer: Self-pay | Admitting: Internal Medicine

## 2016-10-03 ENCOUNTER — Ambulatory Visit (INDEPENDENT_AMBULATORY_CARE_PROVIDER_SITE_OTHER): Payer: Medicare Other | Admitting: Ophthalmology

## 2016-10-03 DIAGNOSIS — I10 Essential (primary) hypertension: Secondary | ICD-10-CM | POA: Diagnosis not present

## 2016-10-03 DIAGNOSIS — E113391 Type 2 diabetes mellitus with moderate nonproliferative diabetic retinopathy without macular edema, right eye: Secondary | ICD-10-CM

## 2016-10-03 DIAGNOSIS — E113591 Type 2 diabetes mellitus with proliferative diabetic retinopathy without macular edema, right eye: Secondary | ICD-10-CM

## 2016-10-03 DIAGNOSIS — E11319 Type 2 diabetes mellitus with unspecified diabetic retinopathy without macular edema: Secondary | ICD-10-CM | POA: Diagnosis not present

## 2016-10-03 DIAGNOSIS — H26493 Other secondary cataract, bilateral: Secondary | ICD-10-CM

## 2016-10-03 DIAGNOSIS — H35033 Hypertensive retinopathy, bilateral: Secondary | ICD-10-CM

## 2016-10-03 DIAGNOSIS — H43813 Vitreous degeneration, bilateral: Secondary | ICD-10-CM | POA: Diagnosis not present

## 2016-10-03 LAB — HM DIABETES EYE EXAM

## 2016-10-13 ENCOUNTER — Ambulatory Visit (INDEPENDENT_AMBULATORY_CARE_PROVIDER_SITE_OTHER): Payer: Medicare Other | Admitting: Ophthalmology

## 2016-10-18 ENCOUNTER — Ambulatory Visit (INDEPENDENT_AMBULATORY_CARE_PROVIDER_SITE_OTHER): Payer: Medicare Other | Admitting: Ophthalmology

## 2016-10-18 DIAGNOSIS — H2701 Aphakia, right eye: Secondary | ICD-10-CM

## 2016-10-28 ENCOUNTER — Telehealth: Payer: Self-pay | Admitting: Interventional Cardiology

## 2016-10-28 NOTE — Telephone Encounter (Signed)
Spoke with Gwynn and advised that I was unable to locate plan of care papers.  Gwynn states that they no longer have the original copy, which is what Medicare requires.  She will fax over a new copy and asked if Dr. Tamala Julian could just initial next to where he signed so they can update pt's chart and provide this paperwork to Medicare.  Will have Dr. Tamala Julian sign once he returns to office.

## 2016-10-28 NOTE — Telephone Encounter (Signed)
New message      Calling to see if we kept a copy of the plan of care form Dr Tamala Julian signed 06-08-16.  If yes, they need [place] to fax them a copy.  They cannot find theirs and they need it for medicare

## 2016-10-29 ENCOUNTER — Emergency Department (HOSPITAL_COMMUNITY): Payer: Medicare Other

## 2016-10-29 ENCOUNTER — Encounter (HOSPITAL_COMMUNITY): Payer: Self-pay | Admitting: Emergency Medicine

## 2016-10-29 ENCOUNTER — Inpatient Hospital Stay (HOSPITAL_COMMUNITY)
Admission: EM | Admit: 2016-10-29 | Discharge: 2016-11-15 | DRG: 234 | Disposition: A | Discharge status: Skilled Nursing Facility | Payer: Medicare Other | Attending: Thoracic Surgery (Cardiothoracic Vascular Surgery) | Admitting: Thoracic Surgery (Cardiothoracic Vascular Surgery)

## 2016-10-29 ENCOUNTER — Other Ambulatory Visit: Payer: Self-pay

## 2016-10-29 DIAGNOSIS — Z7982 Long term (current) use of aspirin: Secondary | ICD-10-CM

## 2016-10-29 DIAGNOSIS — E876 Hypokalemia: Secondary | ICD-10-CM

## 2016-10-29 DIAGNOSIS — J9811 Atelectasis: Secondary | ICD-10-CM | POA: Diagnosis not present

## 2016-10-29 DIAGNOSIS — Z96653 Presence of artificial knee joint, bilateral: Secondary | ICD-10-CM | POA: Diagnosis present

## 2016-10-29 DIAGNOSIS — K219 Gastro-esophageal reflux disease without esophagitis: Secondary | ICD-10-CM | POA: Diagnosis present

## 2016-10-29 DIAGNOSIS — E1159 Type 2 diabetes mellitus with other circulatory complications: Secondary | ICD-10-CM | POA: Diagnosis not present

## 2016-10-29 DIAGNOSIS — N400 Enlarged prostate without lower urinary tract symptoms: Secondary | ICD-10-CM | POA: Diagnosis present

## 2016-10-29 DIAGNOSIS — I119 Hypertensive heart disease without heart failure: Secondary | ICD-10-CM

## 2016-10-29 DIAGNOSIS — I200 Unstable angina: Secondary | ICD-10-CM | POA: Diagnosis not present

## 2016-10-29 DIAGNOSIS — I25110 Atherosclerotic heart disease of native coronary artery with unstable angina pectoris: Principal | ICD-10-CM | POA: Diagnosis present

## 2016-10-29 DIAGNOSIS — I10 Essential (primary) hypertension: Secondary | ICD-10-CM | POA: Diagnosis not present

## 2016-10-29 DIAGNOSIS — I130 Hypertensive heart and chronic kidney disease with heart failure and stage 1 through stage 4 chronic kidney disease, or unspecified chronic kidney disease: Secondary | ICD-10-CM | POA: Diagnosis not present

## 2016-10-29 DIAGNOSIS — E1122 Type 2 diabetes mellitus with diabetic chronic kidney disease: Secondary | ICD-10-CM | POA: Diagnosis not present

## 2016-10-29 DIAGNOSIS — Z79899 Other long term (current) drug therapy: Secondary | ICD-10-CM

## 2016-10-29 DIAGNOSIS — G250 Essential tremor: Secondary | ICD-10-CM | POA: Diagnosis present

## 2016-10-29 DIAGNOSIS — I5032 Chronic diastolic (congestive) heart failure: Secondary | ICD-10-CM | POA: Diagnosis present

## 2016-10-29 DIAGNOSIS — N183 Chronic kidney disease, stage 3 unspecified: Secondary | ICD-10-CM

## 2016-10-29 DIAGNOSIS — Z8673 Personal history of transient ischemic attack (TIA), and cerebral infarction without residual deficits: Secondary | ICD-10-CM

## 2016-10-29 DIAGNOSIS — Z6841 Body Mass Index (BMI) 40.0 and over, adult: Secondary | ICD-10-CM

## 2016-10-29 DIAGNOSIS — R0789 Other chest pain: Secondary | ICD-10-CM | POA: Diagnosis not present

## 2016-10-29 DIAGNOSIS — E785 Hyperlipidemia, unspecified: Secondary | ICD-10-CM | POA: Diagnosis not present

## 2016-10-29 DIAGNOSIS — Z9841 Cataract extraction status, right eye: Secondary | ICD-10-CM

## 2016-10-29 DIAGNOSIS — E669 Obesity, unspecified: Secondary | ICD-10-CM | POA: Diagnosis present

## 2016-10-29 DIAGNOSIS — Z951 Presence of aortocoronary bypass graft: Secondary | ICD-10-CM

## 2016-10-29 DIAGNOSIS — R079 Chest pain, unspecified: Secondary | ICD-10-CM | POA: Diagnosis not present

## 2016-10-29 DIAGNOSIS — Z9842 Cataract extraction status, left eye: Secondary | ICD-10-CM

## 2016-10-29 DIAGNOSIS — I440 Atrioventricular block, first degree: Secondary | ICD-10-CM | POA: Diagnosis present

## 2016-10-29 DIAGNOSIS — G4733 Obstructive sleep apnea (adult) (pediatric): Secondary | ICD-10-CM | POA: Diagnosis present

## 2016-10-29 DIAGNOSIS — R262 Difficulty in walking, not elsewhere classified: Secondary | ICD-10-CM

## 2016-10-29 DIAGNOSIS — E119 Type 2 diabetes mellitus without complications: Secondary | ICD-10-CM

## 2016-10-29 DIAGNOSIS — Z794 Long term (current) use of insulin: Secondary | ICD-10-CM

## 2016-10-29 DIAGNOSIS — G4730 Sleep apnea, unspecified: Secondary | ICD-10-CM | POA: Diagnosis present

## 2016-10-29 DIAGNOSIS — I272 Pulmonary hypertension, unspecified: Secondary | ICD-10-CM | POA: Diagnosis present

## 2016-10-29 DIAGNOSIS — N179 Acute kidney failure, unspecified: Secondary | ICD-10-CM | POA: Diagnosis not present

## 2016-10-29 DIAGNOSIS — I509 Heart failure, unspecified: Secondary | ICD-10-CM

## 2016-10-29 DIAGNOSIS — D62 Acute posthemorrhagic anemia: Secondary | ICD-10-CM | POA: Diagnosis not present

## 2016-10-29 HISTORY — DX: Chronic kidney disease, stage 2 (mild): N18.2

## 2016-10-29 LAB — BASIC METABOLIC PANEL
Anion gap: 7 (ref 5–15)
BUN: 16 mg/dL (ref 6–20)
CO2: 28 mmol/L (ref 22–32)
Calcium: 8.7 mg/dL — ABNORMAL LOW (ref 8.9–10.3)
Chloride: 102 mmol/L (ref 101–111)
Creatinine, Ser: 1.3 mg/dL — ABNORMAL HIGH (ref 0.61–1.24)
GFR calc Af Amer: >60 mL/min (ref >60)
GFR calc non Af Amer: 52 mL/min — ABNORMAL LOW (ref >60)
Glucose, Bld: 275 mg/dL — ABNORMAL HIGH (ref 65–99)
Potassium: 4.2 mmol/L (ref 3.5–5.1)
Sodium: 137 mmol/L (ref 135–145)

## 2016-10-29 LAB — CBC
HCT: 37.5 % — ABNORMAL LOW (ref 39.0–52.0)
Hemoglobin: 12.6 g/dL — ABNORMAL LOW (ref 13.0–17.0)
MCH: 28.3 pg (ref 26.0–34.0)
MCHC: 33.6 g/dL (ref 30.0–36.0)
MCV: 84.3 fL (ref 78.0–100.0)
Platelets: 231 10*3/uL (ref 150–400)
RBC: 4.45 MIL/uL (ref 4.22–5.81)
RDW: 13.1 % (ref 11.5–15.5)
WBC: 5.9 10*3/uL (ref 4.0–10.5)

## 2016-10-29 LAB — APTT: aPTT: 34 seconds (ref 24–36)

## 2016-10-29 LAB — HEPATIC FUNCTION PANEL
ALK PHOS: 84 U/L (ref 38–126)
ALT: 16 U/L — ABNORMAL LOW (ref 17–63)
AST: 15 U/L (ref 15–41)
Albumin: 2.9 g/dL — ABNORMAL LOW (ref 3.5–5.0)
BILIRUBIN DIRECT: 0.1 mg/dL (ref 0.1–0.5)
BILIRUBIN INDIRECT: 0.4 mg/dL (ref 0.3–0.9)
BILIRUBIN TOTAL: 0.5 mg/dL (ref 0.3–1.2)
Total Protein: 6 g/dL — ABNORMAL LOW (ref 6.5–8.1)

## 2016-10-29 LAB — MRSA PCR SCREENING: MRSA BY PCR: NEGATIVE

## 2016-10-29 LAB — GLUCOSE, CAPILLARY
GLUCOSE-CAPILLARY: 304 mg/dL — AB (ref 65–99)
Glucose-Capillary: 304 mg/dL — ABNORMAL HIGH (ref 65–99)

## 2016-10-29 LAB — TSH: TSH: 2.344 u[IU]/mL (ref 0.350–4.500)

## 2016-10-29 LAB — BRAIN NATRIURETIC PEPTIDE: B Natriuretic Peptide: 21.4 pg/mL (ref 0.0–100.0)

## 2016-10-29 LAB — TROPONIN I

## 2016-10-29 LAB — I-STAT TROPONIN, ED: Troponin i, poc: 0 ng/mL (ref 0.00–0.08)

## 2016-10-29 LAB — PROTIME-INR
INR: 0.98
INR: 1.07
Prothrombin Time: 13 seconds (ref 11.4–15.2)
Prothrombin Time: 13.9 seconds (ref 11.4–15.2)

## 2016-10-29 LAB — HEPARIN LEVEL (UNFRACTIONATED): Heparin Unfractionated: 0.45 IU/mL (ref 0.30–0.70)

## 2016-10-29 LAB — LIPASE, BLOOD: Lipase: 16 U/L (ref 11–51)

## 2016-10-29 MED ORDER — CLONAZEPAM 0.5 MG PO TABS
0.5000 mg | ORAL_TABLET | Freq: Two times a day (BID) | ORAL | Status: DC | PRN
  Administered 2016-10-29 – 2016-11-10 (×9): 0.5 mg via ORAL
  Filled 2016-10-29 (×10): qty 1

## 2016-10-29 MED ORDER — FLUTICASONE FUROATE-VILANTEROL 100-25 MCG/INH IN AEPB
1.0000 | INHALATION_SPRAY | Freq: Every day | RESPIRATORY_TRACT | Status: DC
  Administered 2016-10-30 – 2016-11-15 (×8): 1 via RESPIRATORY_TRACT
  Filled 2016-10-29 (×5): qty 28

## 2016-10-29 MED ORDER — RANOLAZINE ER 500 MG PO TB12
1000.0000 mg | ORAL_TABLET | Freq: Two times a day (BID) | ORAL | Status: DC
  Administered 2016-10-29 – 2016-11-08 (×21): 1000 mg via ORAL
  Filled 2016-10-29 (×23): qty 2

## 2016-10-29 MED ORDER — INSULIN ASPART 100 UNIT/ML ~~LOC~~ SOLN
0.0000 [IU] | Freq: Three times a day (TID) | SUBCUTANEOUS | Status: DC
  Administered 2016-10-30: 7 [IU] via SUBCUTANEOUS
  Administered 2016-10-30: 5 [IU] via SUBCUTANEOUS
  Administered 2016-10-31: 3 [IU] via SUBCUTANEOUS

## 2016-10-29 MED ORDER — MORPHINE SULFATE (PF) 2 MG/ML IV SOLN
1.0000 mg | INTRAVENOUS | Status: AC
  Administered 2016-10-29: 1 mg via INTRAVENOUS

## 2016-10-29 MED ORDER — INSULIN ASPART PROT & ASPART (70-30 MIX) 100 UNIT/ML ~~LOC~~ SUSP
70.0000 [IU] | Freq: Three times a day (TID) | SUBCUTANEOUS | Status: DC
  Administered 2016-10-30 – 2016-10-31 (×3): 70 [IU] via SUBCUTANEOUS
  Filled 2016-10-29: qty 10

## 2016-10-29 MED ORDER — ALBUTEROL SULFATE (2.5 MG/3ML) 0.083% IN NEBU: 2.5000 mg | INHALATION_SOLUTION | Freq: Four times a day (QID) | RESPIRATORY_TRACT | Status: DC | PRN

## 2016-10-29 MED ORDER — SODIUM CHLORIDE 0.9% FLUSH: 3.0000 mL | INTRAVENOUS | Status: DC | PRN

## 2016-10-29 MED ORDER — CLOPIDOGREL BISULFATE 75 MG PO TABS
75.0000 mg | ORAL_TABLET | Freq: Every day | ORAL | Status: DC
  Administered 2016-10-29 – 2016-11-02 (×5): 75 mg via ORAL
  Filled 2016-10-29 (×5): qty 1

## 2016-10-29 MED ORDER — NITROGLYCERIN IN D5W 200-5 MCG/ML-% IV SOLN
5.0000 ug/min | INTRAVENOUS | Status: DC
  Administered 2016-10-29: 5 ug/min via INTRAVENOUS
  Administered 2016-10-30: 35 ug/min via INTRAVENOUS
  Filled 2016-10-29 (×2): qty 250

## 2016-10-29 MED ORDER — MECLIZINE HCL 25 MG PO TABS
25.0000 mg | ORAL_TABLET | Freq: Three times a day (TID) | ORAL | Status: DC | PRN
  Administered 2016-10-30: 25 mg via ORAL
  Filled 2016-10-29: qty 2

## 2016-10-29 MED ORDER — ASPIRIN EC 81 MG PO TBEC
81.0000 mg | DELAYED_RELEASE_TABLET | Freq: Every day | ORAL | Status: DC
  Administered 2016-10-30 – 2016-11-08 (×9): 81 mg via ORAL
  Filled 2016-10-29 (×10): qty 1

## 2016-10-29 MED ORDER — ONDANSETRON HCL 4 MG/2ML IJ SOLN
4.0000 mg | Freq: Once | INTRAMUSCULAR | Status: AC
  Administered 2016-10-29: 4 mg via INTRAVENOUS
  Filled 2016-10-29: qty 2

## 2016-10-29 MED ORDER — LISINOPRIL 10 MG PO TABS
10.0000 mg | ORAL_TABLET | Freq: Every day | ORAL | Status: DC
  Administered 2016-10-30 – 2016-10-31 (×2): 10 mg via ORAL
  Filled 2016-10-29 (×2): qty 1

## 2016-10-29 MED ORDER — INSULIN ASPART 100 UNIT/ML ~~LOC~~ SOLN: 0.0000 [IU] | Freq: Three times a day (TID) | SUBCUTANEOUS | Status: DC

## 2016-10-29 MED ORDER — CARBOXYMETHYLCELLULOSE SODIUM 1 % OP SOLN: 1.0000 [drp] | Freq: Every day | OPHTHALMIC | Status: DC | PRN

## 2016-10-29 MED ORDER — METOPROLOL TARTRATE 50 MG PO TABS
50.0000 mg | ORAL_TABLET | Freq: Two times a day (BID) | ORAL | Status: DC
  Administered 2016-10-29: 50 mg via ORAL
  Filled 2016-10-29 (×2): qty 1

## 2016-10-29 MED ORDER — NITROGLYCERIN 2 % TD OINT
1.0000 [in_us] | TOPICAL_OINTMENT | Freq: Four times a day (QID) | TRANSDERMAL | Status: DC
  Administered 2016-10-29: 1 [in_us] via TOPICAL
  Filled 2016-10-29: qty 1

## 2016-10-29 MED ORDER — ACETAMINOPHEN 325 MG PO TABS
650.0000 mg | ORAL_TABLET | ORAL | Status: DC | PRN
  Administered 2016-10-29 – 2016-11-07 (×10): 650 mg via ORAL
  Filled 2016-10-29 (×10): qty 2

## 2016-10-29 MED ORDER — HEPARIN (PORCINE) IN NACL 100-0.45 UNIT/ML-% IJ SOLN
950.0000 [IU]/h | INTRAMUSCULAR | Status: DC
  Administered 2016-10-29 – 2016-10-30 (×2): 1250 [IU]/h via INTRAVENOUS
  Administered 2016-10-31: 1100 [IU]/h via INTRAVENOUS
  Administered 2016-11-01 (×2): 950 [IU]/h via INTRAVENOUS
  Filled 2016-10-29 (×6): qty 250

## 2016-10-29 MED ORDER — ONDANSETRON HCL 4 MG/2ML IJ SOLN
4.0000 mg | Freq: Four times a day (QID) | INTRAMUSCULAR | Status: DC | PRN
Start: 2016-10-29 — End: 2016-11-09

## 2016-10-29 MED ORDER — HEPARIN BOLUS VIA INFUSION
4000.0000 [IU] | Freq: Once | INTRAVENOUS | Status: AC
  Administered 2016-10-29: 4000 [IU] via INTRAVENOUS
  Filled 2016-10-29: qty 4000

## 2016-10-29 MED ORDER — FENOFIBRATE 160 MG PO TABS
160.0000 mg | ORAL_TABLET | Freq: Every day | ORAL | Status: DC
  Administered 2016-10-29 – 2016-11-15 (×17): 160 mg via ORAL
  Filled 2016-10-29 (×17): qty 1

## 2016-10-29 MED ORDER — AMLODIPINE BESYLATE 10 MG PO TABS
10.0000 mg | ORAL_TABLET | Freq: Every day | ORAL | Status: DC
  Administered 2016-10-29 – 2016-11-08 (×11): 10 mg via ORAL
  Filled 2016-10-29 (×11): qty 1

## 2016-10-29 MED ORDER — MORPHINE SULFATE (PF) 4 MG/ML IV SOLN
4.0000 mg | Freq: Once | INTRAVENOUS | Status: AC
  Administered 2016-10-29: 4 mg via INTRAVENOUS
  Filled 2016-10-29: qty 1

## 2016-10-29 MED ORDER — SODIUM CHLORIDE 0.9 % IV SOLN
250.0000 mL | INTRAVENOUS | Status: DC | PRN
  Administered 2016-11-09: 09:00:00 via INTRAVENOUS

## 2016-10-29 MED ORDER — DICYCLOMINE HCL 10 MG PO CAPS
10.0000 mg | ORAL_CAPSULE | Freq: Three times a day (TID) | ORAL | Status: DC
  Administered 2016-10-30 – 2016-11-15 (×42): 10 mg via ORAL
  Filled 2016-10-29 (×54): qty 1

## 2016-10-29 MED ORDER — ATORVASTATIN CALCIUM 80 MG PO TABS
80.0000 mg | ORAL_TABLET | Freq: Every day | ORAL | Status: DC
  Administered 2016-10-29 – 2016-11-14 (×16): 80 mg via ORAL
  Filled 2016-10-29 (×16): qty 1

## 2016-10-29 MED ORDER — POLYVINYL ALCOHOL 1.4 % OP SOLN
1.0000 [drp] | OPHTHALMIC | Status: DC | PRN
  Administered 2016-11-05 – 2016-11-06 (×2): 1 [drp] via OPHTHALMIC
  Filled 2016-10-29 (×3): qty 15

## 2016-10-29 MED ORDER — PANTOPRAZOLE SODIUM 40 MG PO TBEC
40.0000 mg | DELAYED_RELEASE_TABLET | Freq: Two times a day (BID) | ORAL | Status: DC
  Administered 2016-10-29 – 2016-11-08 (×21): 40 mg via ORAL
  Filled 2016-10-29 (×21): qty 1

## 2016-10-29 MED ORDER — PROPRANOLOL HCL 40 MG PO TABS
40.0000 mg | ORAL_TABLET | Freq: Three times a day (TID) | ORAL | Status: DC
  Administered 2016-10-30 – 2016-11-08 (×27): 40 mg via ORAL
  Filled 2016-10-29 (×32): qty 1

## 2016-10-29 MED ORDER — NITROGLYCERIN 0.4 MG SL SUBL
0.4000 mg | SUBLINGUAL_TABLET | SUBLINGUAL | Status: DC | PRN
  Administered 2016-10-31 – 2016-11-04 (×10): 0.4 mg via SUBLINGUAL
  Filled 2016-10-29 (×8): qty 1

## 2016-10-29 MED ORDER — SODIUM CHLORIDE 0.9% FLUSH
3.0000 mL | Freq: Two times a day (BID) | INTRAVENOUS | Status: DC
  Administered 2016-10-31 – 2016-11-07 (×7): 3 mL via INTRAVENOUS

## 2016-10-29 MED ORDER — ASPIRIN 81 MG PO CHEW
162.0000 mg | CHEWABLE_TABLET | Freq: Once | ORAL | Status: AC
  Administered 2016-10-29: 162 mg via ORAL
  Filled 2016-10-29: qty 2

## 2016-10-29 MED ORDER — GABAPENTIN 300 MG PO CAPS
300.0000 mg | ORAL_CAPSULE | Freq: Two times a day (BID) | ORAL | Status: DC
  Administered 2016-10-29 – 2016-11-15 (×32): 300 mg via ORAL
  Filled 2016-10-29 (×32): qty 1

## 2016-10-29 MED ORDER — MORPHINE SULFATE (PF) 2 MG/ML IV SOLN
INTRAVENOUS | Status: AC
  Administered 2016-10-29: 1 mg via INTRAVENOUS
  Filled 2016-10-29: qty 1

## 2016-10-29 NOTE — H&P (Signed)
Cardiology History & Physical    Patient ID: Edward [name] MRN: [id number], DOB: [birth year] Date of Encounter: 10/29/2016, 10:14 AM Primary Physician: Ann Held, DO Primary Cardiologist: Dr. Linard Millers  Chief Complaint: chest pain Reason for Admission: chest pain Requesting MD: Ms. Kenton Kingfisher PA-C  HPI: Edward [name] is a 75 y.o. 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 Last cath was in 12/2015 which revealed an 85% circumflex lesion not treatable by intervention without involving the left main. Ranexa was added with dramatic improvement in symptoms until this admission, with plans to consider PCI/CABG only if meds fail. His cath otherwise showed 40-50% distal LM with cross-sectional area by intravascular ultrasound at most severe narrowing 5.96 mm (borderline), patent mLAD stent, moderate RCA disease, moderate pulm HTN. 2D echo 03/2016: Ef 35-36%, normal diastolic parameters, mild LAE.  For the past 2 weeks he's noticed intermittent chest pain that is occasionally worse with exertion. He also noticed some dyspnea while taking out trash the other day which prompted him to use his inhaler. Last night around 11pm he developed pain in his left upper shoulder described as sharp with some radiation to the arm, followed by left sided chest pressure. He felt a "dry sweat" - he felt like he was sweating but without the wetness. The pain was "unbearable." He had taken his PM dose of Ranexa earlier but around 1am took a second one to see if it helped the pain but it did not. He subsequently called EMS and was given ASA 36m and 1 SL NTG prior to arrival without significant relief. Pain was 10 1/2 out of 10 when it first started. This has gradually eased off  with most relief coming from morphine earlier this AM. He is also now on NTG paste. He notes pain is "much better," rating it a 7/10. He is smiling, joking and requesting a meal tray. This pain is similar to what he experienced in 12/2015. Reports compliance with meds.  Past Medical History:  Diagnosis Date  . Adenomatous colon polyp 04/2011  . CAD (coronary artery disease)    a. 01/2015 DES to LAD  b. 12/2015: [place] o[place]x lesion--> rx therapy.  . Chronic diastolic CHF (congestive heart failure) ([place]   . CKD (chronic kidney disease), stage II    a. probable CKD II-III with baseline CR 1.1-1.3.  . DM type 2 (diabetes mellitus, type 2), insulin dependent 01/29/2014   fasting cbg 50-120 with new regimen  . Dyslipidemia, goal LDL below 70 01/29/2014  . Enlarged prostate   . Essential tremor    a. on proprnolol  . GERD (gastroesophageal reflux disease)   . Hypertension   . Internal hemorrhoid   . Sleep apnea    does not use cpap (06/04/2015)  . TIA (transient ischemic attack) 2002     Surgical History:  Past Surgical History:  Procedure Laterality Date  . BACK SURGERY    . CARDIAC CATHETERIZATION  01/28/14   + CAD treat medically  . CARDIAC CATHETERIZATION N/A 12/21/2015   Procedure: Right/Left Heart Cath and Coronary Angiography;  Surgeon: HBelva Crome MD;  [facility];  Service: Cardiovascular;  Laterality: N/A;  . CATARACT EXTRACTION Bilateral   . CATARACT EXTRACTION, BILATERAL Bilateral   .  CORONARY ANGIOPLASTY WITH STENT PLACEMENT  01/30/2015   DES [facility]  [place] to LAD by Dr Tamala Julian  . JOINT REPLACEMENT    . LEFT HEART CATHETERIZATION WITH CORONARY ANGIOGRAM N/A 01/28/2014   Procedure: LEFT HEART CATHETERIZATION WITH CORONARY ANGIOGRAM;  Surgeon: Sinclair Grooms, MD;  Location: [facility] CATH LAB;  Service: Cardiovascular;  Laterality: N/A;  . LEFT HEART CATHETERIZATION WITH CORONARY ANGIOGRAM N/A 01/30/2015   Procedure: LEFT HEART CATHETERIZATION WITH CORONARY ANGIOGRAM;   Surgeon: Belva Crome, MD;  Location: [facility] C[facility] LAB;  Service: Cardiovascular;  Laterality: N/A;  . LUMBAR [place]    . SHOULDER ARTHROSCOPY Right 07/30/2014   Procedure: Right Shoulder Arthroscopy, Debridement, Decompression, Manipulation Under Anesthesia;  Surgeon: Newt Minion, MD;  Location: [facility];  Service: Orthopedics;  Laterality: Right;  . TOTAL KNEE ARTHROPLASTY Bilateral      Home Meds: Prior to Admission medications   Medication Sig Start Date End Date Taking? Authorizing Provider  amLODipine (NORVASC) 10 MG tablet Take 1 tablet (10 mg total) by mouth daily. 07/06/16  Yes Yvonne R Lowne Chase, DO  aspirin EC 81 MG tablet Take 81 mg by mouth daily.   Yes Historical Provider, MD  atorvastatin (LIPITOR) 20 MG tablet Take 1 tablet (20 mg total) by mouth daily. 07/06/16  Yes Yvonne R Lowne Chase, DO  clonazePAM (KLONOPIN) 0.5 MG tablet Take 1 tablet (0.5 mg total) by mouth 2 (two) times daily as needed (tremors). 09/28/16  Yes Donika K Patel, DO  dicyclomine (BENTYL) 10 MG capsule Take 1 capsule (10 mg total) by mouth 3 (three) times daily before meals. 01/01/16  Yes Yvonne R Lowne Chase, DO  EFFIENT 10 MG TABS tablet take 1 tablet by mouth once daily 09/20/16  Yes Belva Crome, MD  fenofibrate 160 MG tablet Take 1 tablet (160 mg total) by mouth daily. 07/06/16  Yes Yvonne R Lowne Chase, DO  fluticasone furoate-vilanterol (BREO ELLIPTA) 100-25 MCG/INH AEPB Inhale 1 puff into the lungs daily. 01/01/16  Yes Yvonne R Lowne Chase, DO  furosemide (LASIX) 40 MG tablet Take 1 tablet (40 mg total) by mouth daily. Patient taking differently: Take 40 mg by mouth daily as needed for fluid.  07/06/16  Yes Yvonne R Lowne Chase, DO  gabapentin (NEURONTIN) 300 MG capsule Take 1 tablet at bedtime for one week then increase to 1 tablet twice daily Patient taking differently: Take 300 mg by mouth 2 (two) times daily.  07/28/16  Yes Donika K Patel, DO  insulin NPH-regular Human (NOVOLIN 70/30) (70-30) 100 UNIT/ML  injection Inject under skin 70 units 3x a day 09/13/16  Yes Philemon Kingdom, MD  isosorbide mononitrate (IMDUR) 60 MG 24 hr tablet Take 1 tablet (60 mg total) by mouth daily. 01/01/16  Yes Yvonne R Lowne Chase, DO  lisinopril (PRINIVIL,ZESTRIL) 10 MG tablet Take 10 mg by mouth daily. 08/12/16  Yes Historical Provider, MD  metoprolol (LOPRESSOR) 50 MG tablet Take 1 tablet (50 mg total) by mouth 2 (two) times daily. 07/06/16  Yes Yvonne R Lowne Chase, DO  nitroGLYCERIN (NITROSTAT) 0.4 MG SL tablet Place 1 tablet (0.4 mg total) under the tongue every 5 (five) minutes as needed for chest pain (CP or SOB). 02/08/16  Yes Luke K Kilroy, PA-C  NOVOLIN R 100 UNIT/ML injection inject 20-36 units subcutaneously three times a day before meals per sliding scale 08/24/16  Yes Historical Provider, MD  pantoprazole (PROTONIX) 40 MG tablet Take 40 mg by mouth daily. 01/04/16  Yes Historical  Provider, MD  Polyvinyl Alcohol-Povidone (REFRESH OP) Place 1 drop into both eyes daily as needed (for dry eyes). Reported on 12/25/2015   Yes Historical Provider, MD  potassium chloride SA (K-DUR,KLOR-CON) 20 MEQ tablet Take 1 tablet (20 mEq total) by mouth daily. 07/06/16  Yes Yvonne R Lowne Chase, DO  propranolol (INDERAL) 40 MG tablet Take 1 tablet (40 mg total) by mouth 3 (three) times daily. 08/03/16  Yes Donika Keith Rake, DO  ranolazine (RANEXA) 1000 MG SR tablet Take 1 tablet (1,000 mg total) by mouth 2 (two) times daily. 03/21/16  Yes Belva Crome, MD  VENTOLIN HFA 108 (90 BASE) MCG/ACT inhaler Inhale 2 puffs into the lungs every 6 (six) hours as needed for wheezing or shortness of breath.  06/22/15  Yes Historical Provider, MD  Blood Glucose Monitoring Suppl (ONE TOUCH ULTRA SYSTEM KIT) W/DEVICE KIT Use to test blood sugar daily as instructed. Dx code: E11.59 Patient not taking: Reported on 10/29/2016 12/30/14   Philemon Kingdom, MD  Ciclopirox 1 % shampoo Apply 1 application topically at bedtime. At night after showering Patient not  taking: Reported on 10/29/2016 07/05/16   Rosalita Chessman Chase, DO  diclofenac sodium (VOLTAREN) 1 % GEL Apply 2 g topically 4 (four) times daily as needed (for pain).     Historical Provider, MD  esomeprazole (NEXIUM) 40 MG capsule Take 1 capsule (40 mg total) by mouth daily at 12 noon. Patient not taking: Reported on 10/29/2016 07/06/16   Rosalita Chessman Chase, DO  glucose blood test strip Use to test blood sugar 4 times daily instructed. Dx: E11.59 Patient not taking: Reported on 10/29/2016 09/26/16   Philemon Kingdom, MD  HYDROcodone-acetaminophen (NORCO/VICODIN) 5-325 MG tablet Take 1 tablet by mouth every 6 (six) hours as needed for moderate pain. Patient not taking: Reported on 10/29/2016 07/05/16   Rosalita Chessman Chase, DO  Insulin Syringes, Disposable, U-100 1 ML MISC Use 4x a day Patient not taking: Reported on 10/29/2016 08/20/15   Philemon Kingdom, MD  meclizine (ANTIVERT) 25 MG tablet Take 1 tablet (25 mg total) by mouth 3 (three) times daily as needed for dizziness (vertigo). 04/22/16   Theodis Blaze, MD    Allergies: No Known Allergies  Social History   Social History  . Marital status: Married    Spouse name: N/A  . Number of children: 0  . Years of education: N/A   Occupational History  . retired    Social History Main Topics  . Smoking status: Never Smoker  . Smokeless tobacco: Never Used  . Alcohol use No  . Drug use: No  . Sexual activity: No   Other Topics Concern  . 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   He lives alone.  His wife is living in [place] at this time.  They do not have children.   Highest level of education:  College graduate     Family History  Problem Relation Age of Onset  . CAD Brother     2 brothers - CABG  . Alzheimer's disease Sister   . CAD Sister   . Prostate cancer Brother   . Asthma Brother     2 brothers   . Diabetes Other      entire family    Review of Systems: no fevers or chills. All other systems reviewed and are otherwise  negative except as noted above.  Labs:   Lab Results  Component Value Date   WBC 5.9 10/29/2016   HGB 12.6 (L) 10/29/2016   HCT 37.5 (L) 10/29/2016   MCV 84.3 10/29/2016   PLT 231 10/29/2016    Recent Labs Lab 10/29/16 0700  NA 137  K 4.2  CL 102  CO2 28  BUN 16  CREATININE 1.30*  CALCIUM 8.7*  GLUCOSE 275*    Lab Results  Component Value Date   CHOL 189 07/05/2016   HDL 44.10 07/05/2016   LDLCALC 110 (H) 07/05/2016   TRIG 175.0 (H) 07/05/2016   Lab Results  Component Value Date   DDIMER <0.27 06/05/2015    Radiology/Studies:  Dg Chest 2 View  Result Date: 10/29/2016 CLINICAL DATA:  75 year old male with chest pain on the left radiating to the shoulder. Initial encounter. EXAM: CHEST  2 VIEW COMPARISON:  Chest CT 08/29/2016 and earlier. FINDINGS: Stable lung volumes, low normal. Normal cardiac size and mediastinal contours. Visualized tracheal air column is within normal limits. No pneumothorax, pulmonary edema, pleural effusion or confluent pulmonary opacity. Mild thoracic scoliosis. Advanced degenerative changes at both shoulders with glenohumeral joint osteophytosis and subchondral sclerosis. No acute osseous abnormality identified. IMPRESSION: No acute cardiopulmonary abnormality. Electronically Signed   By: Genevie Ann M.D.   On: 10/29/2016 07:49   Wt Readings from Last 3 Encounters:  09/28/16 277 lb 7 oz (125.8 kg)  09/13/16 274 lb (124.3 kg)  07/28/16 275 lb 1 oz (124.8 kg)     EKG: NSR 60bpm 1st degree AVB, no acute changes  Physical Exam: Blood pressure (!) 128/53, pulse (!) 54, temperature 98.1 F (36.7 C), temperature source Oral, resp. rate (!) 9, SpO2 100 %. There is no height or weight on file to calculate BMI. General: Well developed, well nourished obese AAM, in no acute distress. Head: Normocephalic, atraumatic, sclera non-icteric, no xanthomas,  nares are without discharge.  Neck: Negative for carotid bruits. JVD not elevated. Lungs: Clear bilaterally to auscultation without wheezes, rales, or rhonchi. Breathing is unlabored. Heart: RRR with S1 S2. No murmurs, rubs, or gallops appreciated. Abdomen: Soft, non-tender, non-distended with normoactive bowel sounds. No hepatomegaly. No rebound/guarding. No obvious abdominal masses. Msk:  Strength and tone appear normal for age. Extremities: No clubbing or cyanosis. + bilateral chronic venous stasis changes with mild edema.  Distal pedal pulses are 2+ and equal bilaterally. Neuro: Alert and oriented X 3. No focal deficit. No facial asymmetry. Moves all extremities spontaneously. Slower cadence of speech Psych:  Responds to questions appropriately with a normal affect.    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  1. Chest pain - mixed typical/atypical features. Despite prolonged pain, negative troponins, and nonacute EKG thus far. His symptoms have reminded him of prior unstable angina in 12/2015 when cath was noted above. He had seen good improvement with Ranexa up until now so question component of microvascular disease as well. He reports feeling much better than before. He does note residual pain but does not appear particularly bothered by this. It is somewhat unusual that he had such prolonged severe pain but without markers of ischemia. However, given his diabetes, his presentation may not be quite so typical. Will also check  lipase and LFTs. Will change NTG paste to NTG drip, titrate PPI, and continue heparin per pharmacy. Anticipate cardiac cath this admission, sooner if troponins turn positive or if pain does  not continue to subside.  2. CAD - with his history of known TIA (documented as right sided numbness per neurology in 2002), would strongly consider changing Effient to an alternative agent. (Addendum: per review with Dr. Bronson Ing, start Plavix 76m daily today in lieu of Effient).  3. HTN - controlled. Continue home regimen except change lisinopril to tomorrow (in case BP is needed to increase NTG) and change Imdur to NTG gtt.  4. Diabetes mellitus - continue Novolin 70/30 home dose. Change his higher dose sliding scale to IP sliding scale (can titrate up if needed).  5. Chronic diastolic CHF - CXR clear. Continue present regimen and monitor. He is only taking Lasix PRN - will hold for now in case urgent cath needed.  6. 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 Follow.  Signed, DCharlie PitterPA-C 10/29/2016, 10:14 AM  Pager: [phone] The patient was seen and examined, and I agree with the history, physical exam, assessment and plan as documented above. Pt with known CAD with most recent coronary angiogram in 12/2015 reviewed above admitted with chest pain which awoke him from sleep. Was given morphine in ED and pain has gone down from 10/10 to 5/10. He now requests a meal tray, wants to walk around, and wants to watch football. POC troponin is normal and ECG is unremarkable. Will plan to add IV nitroglycerin for pain relief and continue IV heparin. Will switch Effient to Plavix given h/o TIA and concern for provoking an intracerebral bleed. Will check serial serum troponins to rule out for ACS and plan for coronary angiogram on 11/01/16.   SKate Sable MD, [facility] 10/29/2016 10:51 AM

## 2016-10-29 NOTE — Progress Notes (Signed)
ANTICOAGULATION CONSULT NOTE - Follow Up Consult  Pharmacy Consult for Heparin Indication: chest pain/ACS  No Known Allergies  Patient Measurements:   Heparin Dosing Weight: 95 kg  Vital Signs: Temp: 98.1 F (36.7 C) (12/30 0656) Temp Source: Oral (12/30 0656) BP: 128/53 (12/30 0815) Pulse Rate: 57 (12/30 0815)  Labs:  Recent Labs  10/29/16 0700  HGB 12.6*  HCT 37.5*  PLT 231  CREATININE 1.30*    CrCl cannot be calculated (Unknown ideal weight.).   Medications:  Infusions:    Assessment: 75 year old male with history of CAD presenting with chest pain. He is to begin anticoagulation with heparin.   Goal of Therapy:  Heparin level 0.3-0.7 units/ml Monitor platelets by anticoagulation protocol: Yes   Plan:  Heparin 4000 units IV bolus Start heparin infusion at 1250 units/hr Check heparin level in 8 hours Daily heparin level and CBC Check baseline coag studies  Legrand Como, Pharm.D., BCPS, AAHIVP Clinical Pharmacist Phone: [phone] or [phone] Pager: [phone] 10/29/2016, 8:35 AM

## 2016-10-29 NOTE — Progress Notes (Signed)
If pt remains stable and does not need to go to lab acutely over the next 72 hours will need to be placed on cath  board for Tuesday 11/01/16 with orders.  Channon Brougher PA-C

## 2016-10-29 NOTE — Progress Notes (Signed)
ANTICOAGULATION CONSULT NOTE - Follow Up Consult  Pharmacy Consult for Heparin Indication: chest pain/ACS  No Known Allergies  Patient Measurements: Weight: 272 lb 7.8 oz (123.6 kg) Heparin Dosing Weight: 95 kg  Vital Signs: Temp: 97.4 F (36.3 C) (12/30 1737) Temp Source: Oral (12/30 1737) BP: 136/74 (12/30 1737) Pulse Rate: 66 (12/30 1737)  Labs:  Recent Labs  10/29/16 0700 10/29/16 1710  HGB 12.6*  --   HCT 37.5*  --   PLT 231  --   APTT 34  --   LABPROT 13.0  --   INR 0.98  --   HEPARINUNFRC  --  0.45  CREATININE 1.30*  --     Estimated Creatinine Clearance: 62.8 mL/min (by C-G formula based on SCr of 1.3 mg/dL (H)).   Medications:  Infusions:  . heparin 1,250 Units/hr (10/29/16 0926)  . nitroGLYCERIN 35 mcg/min (10/29/16 1436)    Assessment: 75 year old male with history of CAD presenting with chest pain. He is on anticoagulation with heparin.  -Initial heparin level= 0.45  Goal of Therapy:  Heparin level 0.3-0.7 units/ml Monitor platelets by anticoagulation protocol: Yes   Plan:  No heparin changes needed Daily heparin level and CBC  Hildred Laser, Pharm D 10/29/2016 6:31 PM

## 2016-10-29 NOTE — ED Provider Notes (Signed)
[facility] DEPT Provider Note   CSN: [id number] Arrival date & time: 10/29/16  5284     History   Chief Complaint Chief Complaint  Patient presents with  . Chest Pain    HPI Edward [name] is a 75 y.o. male. Past medical history of coronary artery disease, history of CHF, unstable angina, insulin-dependent diabetes, last cardiac catheterization in 12/2015 with significant coronary artery disease and mid LADF lesion treated with DES. He is followed by Dr. Pernell Dupre. Patient states that around 1 AM he awoke with severe left-sided chest pain radiating into his left arm. He had associated diaphoresis and nausea. The patient took a dose of her neck supple without relief of his symptoms. He called the ambulance who gave him 2 doses of sublingual nitroglycerin. The patient states that he took an aspirin at home. However, I'm unsure of the dose and will give a half dose of ASA here. Patient states he still has severe unrelenting chest pain. His diaphoresis and nausea have resolved. He denies recent URI, fevers, chills.  HPI  Past Medical History:  Diagnosis Date  . Adenomatous colon polyp 04/2011  . CAD (coronary artery disease)    a. 01/2015 DES to LAD  b. 12/2015: [place] 85% oLCx lesion--> rx therapy.  . Chronic diastolic CHF (congestive heart failure) ([place])   . DM type 2 (diabetes mellitus, type 2), insulin dependent 01/29/2014   fasting cbg 50-120 with new regimen  . Dyslipidemia, goal LDL below 70 01/29/2014  . Enlarged prostate   . Essential tremor    a. on proprnolol  . GERD (gastroesophageal reflux disease)   . Hypertension   . Internal hemorrhoid   . Sleep apnea    does not use cpap (06/04/2015)  . TIA (transient ischemic attack) 2002    Patient Active Problem List   Diagnosis Date Noted  . Flank pain, acute 07/06/2016  . Dizziness   . Hypokalemia 04/19/2016  . Chest pain 04/19/2016  . Near syncope 04/19/2016  . Lightheaded 04/19/2016  . Type 2 diabetes mellitus with  circulatory disorder, with long-term current use of insulin ([place]) 03/11/2016  . CAD (coronary artery disease)   . Unstable angina ([place]) 12/18/2015  . Wheezing 11/30/2015  . GERD (gastroesophageal reflux disease) 08/27/2015  . Acute bronchitis 08/27/2015  . Dyspnea 07/08/2015  . OSA (obstructive sleep apnea) 06/22/2015  . Essential tremor 04/23/2015  . Rotator cuff arthropathy 07/30/2014  . CAP (community acquired pneumonia) 04/16/2014  . Severe obesity (BMI >= 40) ([place]) 02/11/2014  . Dyslipidemia, goal LDL below 70 01/29/2014  . HTN (hypertension) 01/29/2014    Past Surgical History:  Procedure Laterality Date  . BACK SURGERY    . CARDIAC CATHETERIZATION  01/28/14   + CAD treat medically  . CARDIAC CATHETERIZATION N/A 12/21/2015   Procedure: Right/Left Heart Cath and Coronary Angiography;  Surgeon: Belva Crome, MD;  Location: [facility] CV LAB;  Service: Cardiovascular;  Laterality: N/A;  . CATARACT EXTRACTION Bilateral   . CATARACT EXTRACTION, BILATERAL Bilateral   . CORONARY ANGIOPLASTY WITH STENT PLACEMENT  01/30/2015   DES [place]  [place] to LAD by Dr Tamala Julian  . JOINT REPLACEMENT    . LEFT HEART CATHETERIZATION WITH CORONARY ANGIOGRAM N/A 01/28/2014   Procedure: LEFT HEART CATHETERIZATION WITH CORONARY ANGIOGRAM;  Surgeon: Sinclair Grooms, MD;  Location: [facility] CATH LAB;  Service: Cardiovascular;  Laterality: N/A;  . LEFT HEART CATHETERIZATION WITH CORONARY ANGIOGRAM N/A 01/30/2015   Procedure: LEFT HEART CATHETERIZATION WITH CORONARY ANGIOGRAM;  Surgeon: Belva Crome, MD;  Location: [facility] CATH LAB;  Service: Cardiovascular;  Laterality: N/A;  . LUMBAR [place]    . SHOULDER ARTHROSCOPY Right 07/30/2014   Procedure: Right Shoulder Arthroscopy, Debridement, Decompression, Manipulation Under Anesthesia;  Surgeon: Newt Minion, MD;  Location: [facility];  Service: Orthopedics;  Laterality: Right;  . TOTAL KNEE ARTHROPLASTY Bilateral        Home Medications    Prior to Admission  medications   Medication Sig Start Date End Date Taking? Authorizing Provider  amLODipine (NORVASC) 10 MG tablet Take 1 tablet (10 mg total) by mouth daily. 07/06/16   Alferd Apa Lowne Chase, DO  aspirin EC 81 MG tablet Take 81 mg by mouth daily.    Historical Provider, MD  atorvastatin (LIPITOR) 20 MG tablet Take 1 tablet (20 mg total) by mouth daily. 07/06/16   Rosalita Chessman Chase, DO  B-D INS SYR ULTRAFINE 1CC/31G 31G X 5/16" 1 ML MISC inject three times a day AS INSTRUCTED 12/16/15   Historical Provider, MD  BD INSULIN SYRINGE U-500 31G X 6MM 0.5 ML MISC  12/01/15   Historical Provider, MD  Blood Glucose Monitoring Suppl (ONE TOUCH ULTRA SYSTEM KIT) W/DEVICE KIT Use to test blood sugar daily as instructed. Dx code: E11.59 12/30/14   Philemon Kingdom, MD  Ciclopirox 1 % shampoo Apply 1 application topically at bedtime. At night after showering 07/05/16   Rosalita Chessman Chase, DO  clonazePAM (KLONOPIN) 0.5 MG tablet Take 1 tablet (0.5 mg total) by mouth 2 (two) times daily as needed (tremors). 09/28/16   Donika Keith Rake, DO  diclofenac sodium (VOLTAREN) 1 % GEL Apply 2 g topically 4 (four) times daily.    Historical Provider, MD  dicyclomine (BENTYL) 10 MG capsule Take 1 capsule (10 mg total) by mouth 3 (three) times daily before meals. 01/01/16   Rosalita Chessman Chase, DO  EFFIENT 10 MG TABS tablet take 1 tablet by mouth once daily 09/20/16   Belva Crome, MD  esomeprazole (NEXIUM) 40 MG capsule Take 1 capsule (40 mg total) by mouth daily at 12 noon. 07/06/16   Alferd Apa Lowne Chase, DO  fenofibrate 160 MG tablet Take 1 tablet (160 mg total) by mouth daily. 07/06/16   Alferd Apa Lowne Chase, DO  fluticasone furoate-vilanterol (BREO ELLIPTA) 100-25 MCG/INH AEPB Inhale 1 puff into the lungs daily. 01/01/16   Rosalita Chessman Chase, DO  furosemide (LASIX) 40 MG tablet Take 1 tablet (40 mg total) by mouth daily. 07/06/16   Rosalita Chessman Chase, DO  gabapentin (NEURONTIN) 300 MG capsule Take 1 tablet at bedtime for one week  then increase to 1 tablet twice daily 07/28/16   Donika K Patel, DO  glucose blood test strip Use to test blood sugar 4 times daily instructed. Dx: E11.59 09/26/16   Philemon Kingdom, MD  HYDROcodone-acetaminophen (NORCO/VICODIN) 5-325 MG tablet Take 1 tablet by mouth every 6 (six) hours as needed for moderate pain. 07/05/16   Rosalita Chessman Chase, DO  insulin NPH-regular Human (NOVOLIN 70/30) (70-30) 100 UNIT/ML injection Inject under skin 70 units 3x a day 09/13/16   Philemon Kingdom, MD  Insulin Syringes, Disposable, U-100 1 ML MISC Use 4x a day 08/20/15   Philemon Kingdom, MD  isosorbide mononitrate (IMDUR) 60 MG 24 hr tablet Take 1 tablet (60 mg total) by mouth daily. 01/01/16   Rosalita Chessman Chase, DO  lisinopril (PRINIVIL,ZESTRIL) 10 MG tablet Take 10 mg by mouth daily. 08/12/16  Historical Provider, MD  meclizine (ANTIVERT) 25 MG tablet Take 1 tablet (25 mg total) by mouth 3 (three) times daily as needed for dizziness (vertigo). 04/22/16   Theodis Blaze, MD  metoprolol (LOPRESSOR) 50 MG tablet Take 1 tablet (50 mg total) by mouth 2 (two) times daily. 07/06/16   Rosalita Chessman Chase, DO  nitroGLYCERIN (NITROSTAT) 0.4 MG SL tablet Place 1 tablet (0.4 mg total) under the tongue every 5 (five) minutes as needed for chest pain (CP or SOB). 02/08/16   Erlene Quan, PA-C  NOVOLIN R 100 UNIT/ML injection inject 20-36 units subcutaneously three times a day before meals 08/24/16   Historical Provider, MD  pantoprazole (PROTONIX) 40 MG tablet Take 40 mg by mouth daily. 01/04/16   Historical Provider, MD  Polyvinyl Alcohol-Povidone (REFRESH OP) Place 1 drop into both eyes daily as needed (for dry eyes). Reported on 12/25/2015    Historical Provider, MD  potassium chloride SA (K-DUR,KLOR-CON) 20 MEQ tablet Take 1 tablet (20 mEq total) by mouth daily. 07/06/16   Alferd Apa Lowne Chase, DO  propranolol (INDERAL) 40 MG tablet Take 1 tablet (40 mg total) by mouth 3 (three) times daily. 08/03/16   Donika Keith Rake, DO    ranolazine (RANEXA) 1000 MG SR tablet Take 1 tablet (1,000 mg total) by mouth 2 (two) times daily. 03/21/16   Belva Crome, MD  VENTOLIN HFA 108 (90 BASE) MCG/ACT inhaler Inhale 2 puffs into the lungs every 6 (six) hours as needed. (SHORTNESS OR BREATH / WHEEZING) 06/22/15   Historical Provider, MD    Family History Family History  Problem Relation Age of Onset  . CAD Brother     2 brothers - CABG  . Alzheimer's disease Sister   . CAD Sister   . Prostate cancer Brother   . Asthma Brother     2 brothers   . Diabetes Other     entire family    Social History Social History  Substance Use Topics  . Smoking status: Never Smoker  . Smokeless tobacco: Never Used  . Alcohol use No     Allergies   Patient has no known allergies.   Review of Systems Review of Systems  Ten systems reviewed and are negative for acute change, except as noted in the HPI.   Physical Exam Updated Vital Signs BP (!) 134/52   Pulse (!) 56   Temp 98.1 F (36.7 C) (Oral)   Resp 12   SpO2 100%   Physical Exam  Constitutional: He appears well-developed and well-nourished. No distress.  HENT:  Head: Normocephalic and atraumatic.  Eyes: Conjunctivae are normal. No scleral icterus.  Neck: Normal range of motion. Neck supple.  Cardiovascular: Normal rate, regular rhythm and normal heart sounds.   No murmur heard. Pulmonary/Chest: Effort normal and breath sounds normal. No respiratory distress. He has no wheezes. He has no rales.  Abdominal: Soft. He exhibits no distension. There is no tenderness.  Musculoskeletal: He exhibits no edema.  Neurological: He is alert.  Skin: Skin is warm and dry. He is not diaphoretic.  Psychiatric: His behavior is normal.  Nursing note and vitals reviewed.    ED Treatments / Results  Labs (all labs ordered are listed, but only abnormal results are displayed) Labs Reviewed  BASIC METABOLIC PANEL - Abnormal; Notable for the following:       Result Value    Glucose, Bld 275 (*)    Creatinine, Ser 1.30 (*)    Calcium 8.7 (*)  GFR calc non Af Amer 52 (*)    All other components within normal limits  CBC - Abnormal; Notable for the following:    Hemoglobin 12.6 (*)    HCT 37.5 (*)    All other components within normal limits  I-STAT TROPOININ, ED    EKG  EKG Interpretation None       Radiology Dg Chest 2 View  Result Date: 10/29/2016 CLINICAL DATA:  75 year old male with chest pain on the left radiating to the shoulder. Initial encounter. EXAM: CHEST  2 VIEW COMPARISON:  Chest CT 08/29/2016 and earlier. FINDINGS: Stable lung volumes, low normal. Normal cardiac size and mediastinal contours. Visualized tracheal air column is within normal limits. No pneumothorax, pulmonary edema, pleural effusion or confluent pulmonary opacity. Mild thoracic scoliosis. Advanced degenerative changes at both shoulders with glenohumeral joint osteophytosis and subchondral sclerosis. No acute osseous abnormality identified. IMPRESSION: No acute cardiopulmonary abnormality. Electronically Signed   By: Genevie Ann M.D.   On: 10/29/2016 07:49    Procedures .Critical Care Performed by: Margarita Mail Authorized by: Margarita Mail   Critical care provider statement:    Critical care time (minutes):  35   Critical care time was exclusive of:  Separately billable procedures and treating other patients   Critical care was necessary to treat or prevent imminent or life-threatening deterioration of the following conditions: unstable angina.   Critical care was time spent personally by me on the following activities:  Development of treatment plan with patient or surrogate, discussions with consultants, evaluation of patient's response to treatment, examination of patient, interpretation of cardiac output measurements, obtaining history from patient or surrogate, review of old charts, re-evaluation of patient's condition, pulse oximetry, ordering and review of  laboratory studies, ordering and performing treatments and interventions and ordering and review of radiographic studies   (including critical care time)  Medications Ordered in ED Medications  nitroGLYCERIN (NITROGLYN) 2 % ointment 1 inch (1 inch Topical Given 10/29/16 0828)  heparin ADULT infusion 100 units/mL (25000 units/294m sodium chloride 0.45%) (1,250 Units/hr Intravenous New Bag/Given 10/29/16 0926)  morphine 4 MG/ML injection 4 mg (4 mg Intravenous Given 10/29/16 0831)  ondansetron (ZOFRAN) injection 4 mg (4 mg Intravenous Given 10/29/16 0830)  aspirin chewable tablet 162 mg (162 mg Oral Given 10/29/16 0834)  heparin bolus via infusion 4,000 Units (4,000 Units Intravenous Bolus from Bag 10/29/16 0927)     Initial Impression / Assessment and Plan / ED Course  I have reviewed the triage vital signs and the nursing notes.  Pertinent labs & imaging results that were available during my care of the patient were reviewed by me and considered in my medical decision making (see chart for details).  Clinical Course     Patient EKG unchanged, negative troponin.  In the setting of unrelenting chest pain with nausea/ diaphoresis/ hax of UA patient treated for ACS with Heparin. 9:34 AM his pain is improved but still present. I have spoken with the cardiology team who will see the patient for admission . BP (!) 128/53   Pulse (!) 54   Temp 98.1 F (36.7 C) (Oral)   Resp (!) 9   SpO2 100%   Patient admitted by cardiology for unstable angina. Final Clinical Impressions(s) / ED Diagnoses   Final diagnoses:  Unstable angina ([facility]    New Prescriptions New Prescriptions   No medications on file     AMargarita Mail PA-C 10/29/16 1557    SVirgel Manifold MD 11/03/16 1306

## 2016-10-29 NOTE — ED Notes (Signed)
Pt transported to xray 

## 2016-10-29 NOTE — ED Triage Notes (Signed)
Patient reports left upper chest pain radiating to left arm onset last night with SOB and diaphoresis , pt. received 1 NTG sl and ASA 324 mg prior to arrival with no relief , his cardiologist is Dr. Daneen Schick . He has a history od CAD / Coronary stent placement .

## 2016-10-30 DIAGNOSIS — E1122 Type 2 diabetes mellitus with diabetic chronic kidney disease: Secondary | ICD-10-CM | POA: Diagnosis present

## 2016-10-30 DIAGNOSIS — I469 Cardiac arrest, cause unspecified: Secondary | ICD-10-CM | POA: Diagnosis not present

## 2016-10-30 DIAGNOSIS — N179 Acute kidney failure, unspecified: Secondary | ICD-10-CM | POA: Diagnosis not present

## 2016-10-30 DIAGNOSIS — R41841 Cognitive communication deficit: Secondary | ICD-10-CM | POA: Diagnosis not present

## 2016-10-30 DIAGNOSIS — E669 Obesity, unspecified: Secondary | ICD-10-CM | POA: Diagnosis present

## 2016-10-30 DIAGNOSIS — R062 Wheezing: Secondary | ICD-10-CM | POA: Diagnosis not present

## 2016-10-30 DIAGNOSIS — R079 Chest pain, unspecified: Secondary | ICD-10-CM | POA: Diagnosis not present

## 2016-10-30 DIAGNOSIS — J9811 Atelectasis: Secondary | ICD-10-CM | POA: Diagnosis not present

## 2016-10-30 DIAGNOSIS — R531 Weakness: Secondary | ICD-10-CM | POA: Diagnosis not present

## 2016-10-30 DIAGNOSIS — E1151 Type 2 diabetes mellitus with diabetic peripheral angiopathy without gangrene: Secondary | ICD-10-CM | POA: Diagnosis not present

## 2016-10-30 DIAGNOSIS — Z01810 Encounter for preprocedural cardiovascular examination: Secondary | ICD-10-CM | POA: Diagnosis not present

## 2016-10-30 DIAGNOSIS — I25110 Atherosclerotic heart disease of native coronary artery with unstable angina pectoris: Secondary | ICD-10-CM | POA: Diagnosis not present

## 2016-10-30 DIAGNOSIS — Z96653 Presence of artificial knee joint, bilateral: Secondary | ICD-10-CM | POA: Diagnosis present

## 2016-10-30 DIAGNOSIS — E785 Hyperlipidemia, unspecified: Secondary | ICD-10-CM | POA: Diagnosis not present

## 2016-10-30 DIAGNOSIS — I130 Hypertensive heart and chronic kidney disease with heart failure and stage 1 through stage 4 chronic kidney disease, or unspecified chronic kidney disease: Secondary | ICD-10-CM | POA: Diagnosis present

## 2016-10-30 DIAGNOSIS — I200 Unstable angina: Secondary | ICD-10-CM | POA: Diagnosis not present

## 2016-10-30 DIAGNOSIS — N400 Enlarged prostate without lower urinary tract symptoms: Secondary | ICD-10-CM | POA: Diagnosis present

## 2016-10-30 DIAGNOSIS — D62 Acute posthemorrhagic anemia: Secondary | ICD-10-CM | POA: Diagnosis not present

## 2016-10-30 DIAGNOSIS — Z9841 Cataract extraction status, right eye: Secondary | ICD-10-CM | POA: Diagnosis not present

## 2016-10-30 DIAGNOSIS — Z8673 Personal history of transient ischemic attack (TIA), and cerebral infarction without residual deficits: Secondary | ICD-10-CM | POA: Diagnosis not present

## 2016-10-30 DIAGNOSIS — Z794 Long term (current) use of insulin: Secondary | ICD-10-CM | POA: Diagnosis not present

## 2016-10-30 DIAGNOSIS — I10 Essential (primary) hypertension: Secondary | ICD-10-CM | POA: Diagnosis not present

## 2016-10-30 DIAGNOSIS — R55 Syncope and collapse: Secondary | ICD-10-CM | POA: Diagnosis not present

## 2016-10-30 DIAGNOSIS — Z79899 Other long term (current) drug therapy: Secondary | ICD-10-CM | POA: Diagnosis not present

## 2016-10-30 DIAGNOSIS — G4733 Obstructive sleep apnea (adult) (pediatric): Secondary | ICD-10-CM | POA: Diagnosis present

## 2016-10-30 DIAGNOSIS — G250 Essential tremor: Secondary | ICD-10-CM | POA: Diagnosis not present

## 2016-10-30 DIAGNOSIS — I272 Pulmonary hypertension, unspecified: Secondary | ICD-10-CM | POA: Diagnosis present

## 2016-10-30 DIAGNOSIS — K219 Gastro-esophageal reflux disease without esophagitis: Secondary | ICD-10-CM | POA: Diagnosis present

## 2016-10-30 DIAGNOSIS — E1159 Type 2 diabetes mellitus with other circulatory complications: Secondary | ICD-10-CM | POA: Diagnosis not present

## 2016-10-30 DIAGNOSIS — I25700 Atherosclerosis of coronary artery bypass graft(s), unspecified, with unstable angina pectoris: Secondary | ICD-10-CM | POA: Diagnosis not present

## 2016-10-30 DIAGNOSIS — I341 Nonrheumatic mitral (valve) prolapse: Secondary | ICD-10-CM | POA: Diagnosis not present

## 2016-10-30 DIAGNOSIS — R42 Dizziness and giddiness: Secondary | ICD-10-CM | POA: Diagnosis not present

## 2016-10-30 DIAGNOSIS — G4730 Sleep apnea, unspecified: Secondary | ICD-10-CM | POA: Diagnosis present

## 2016-10-30 DIAGNOSIS — E876 Hypokalemia: Secondary | ICD-10-CM | POA: Diagnosis not present

## 2016-10-30 DIAGNOSIS — I5032 Chronic diastolic (congestive) heart failure: Secondary | ICD-10-CM | POA: Diagnosis not present

## 2016-10-30 DIAGNOSIS — I2510 Atherosclerotic heart disease of native coronary artery without angina pectoris: Secondary | ICD-10-CM | POA: Diagnosis not present

## 2016-10-30 DIAGNOSIS — Z951 Presence of aortocoronary bypass graft: Secondary | ICD-10-CM | POA: Diagnosis not present

## 2016-10-30 DIAGNOSIS — N183 Chronic kidney disease, stage 3 (moderate): Secondary | ICD-10-CM | POA: Diagnosis not present

## 2016-10-30 DIAGNOSIS — Z6841 Body Mass Index (BMI) 40.0 and over, adult: Secondary | ICD-10-CM | POA: Diagnosis not present

## 2016-10-30 DIAGNOSIS — Z7982 Long term (current) use of aspirin: Secondary | ICD-10-CM | POA: Diagnosis not present

## 2016-10-30 DIAGNOSIS — Z9842 Cataract extraction status, left eye: Secondary | ICD-10-CM | POA: Diagnosis not present

## 2016-10-30 DIAGNOSIS — M6281 Muscle weakness (generalized): Secondary | ICD-10-CM | POA: Diagnosis not present

## 2016-10-30 DIAGNOSIS — I119 Hypertensive heart disease without heart failure: Secondary | ICD-10-CM | POA: Diagnosis not present

## 2016-10-30 LAB — CBC
HCT: 36.2 % — ABNORMAL LOW (ref 39.0–52.0)
HEMOGLOBIN: 12.1 g/dL — AB (ref 13.0–17.0)
MCH: 28.7 pg (ref 26.0–34.0)
MCHC: 33.4 g/dL (ref 30.0–36.0)
MCV: 85.8 fL (ref 78.0–100.0)
Platelets: 222 10*3/uL (ref 150–400)
RBC: 4.22 MIL/uL (ref 4.22–5.81)
RDW: 13.1 % (ref 11.5–15.5)
WBC: 5.3 10*3/uL (ref 4.0–10.5)

## 2016-10-30 LAB — GLUCOSE, CAPILLARY
GLUCOSE-CAPILLARY: 313 mg/dL — AB (ref 65–99)
GLUCOSE-CAPILLARY: 79 mg/dL (ref 65–99)
GLUCOSE-CAPILLARY: 118 mg/dL — AB (ref 65–99)
Glucose-Capillary: 299 mg/dL — ABNORMAL HIGH (ref 65–99)
Glucose-Capillary: 92 mg/dL (ref 65–99)
Glucose-Capillary: 187 mg/dL — ABNORMAL HIGH (ref 65–99)

## 2016-10-30 LAB — LIPID PANEL
CHOL/HDL RATIO: 3.9 ratio
CHOLESTEROL: 130 mg/dL (ref 0–200)
HDL: 33 mg/dL — AB (ref >40)
LDL Cholesterol: 78 mg/dL (ref 0–99)
TRIGLYCERIDES: 93 mg/dL (ref <150)
VLDL: 19 mg/dL (ref 0–40)

## 2016-10-30 LAB — BASIC METABOLIC PANEL
ANION GAP: 8 (ref 5–15)
BUN: 14 mg/dL (ref 6–20)
CHLORIDE: 100 mmol/L — AB (ref 101–111)
CO2: 28 mmol/L (ref 22–32)
CREATININE: 1.26 mg/dL — AB (ref 0.61–1.24)
Calcium: 8.8 mg/dL — ABNORMAL LOW (ref 8.9–10.3)
GFR calc non Af Amer: 54 mL/min — ABNORMAL LOW (ref >60)
Glucose, Bld: 316 mg/dL — ABNORMAL HIGH (ref 65–99)
POTASSIUM: 3.9 mmol/L (ref 3.5–5.1)
SODIUM: 136 mmol/L (ref 135–145)

## 2016-10-30 LAB — HEPARIN LEVEL (UNFRACTIONATED): Heparin Unfractionated: 0.5 IU/mL (ref 0.30–0.70)

## 2016-10-30 LAB — TROPONIN I
Troponin I: <0.03 ng/mL (ref <0.03)
Troponin I: <0.03 ng/mL (ref <0.03)

## 2016-10-30 MED ORDER — MAGNESIUM HYDROXIDE 400 MG/5ML PO SUSP
15.0000 mL | Freq: Every day | ORAL | Status: DC | PRN
  Administered 2016-10-30: 15 mL via ORAL
  Filled 2016-10-30 (×2): qty 30

## 2016-10-30 NOTE — Progress Notes (Signed)
Informed Dr. Raiford Simmonds That patients CBG is 12 and patient has third dose of 70/30 due. Per MD hold 70/30

## 2016-10-30 NOTE — Progress Notes (Addendum)
Patient is requesting that his wife be called and updated. Informed Patient after several attempts that no one has answered and he is giving permission for a voicemail to be left. Patient is also requesting the pastor at his church be updated and gives permission to leave a voicemail on his phone as well.

## 2016-10-30 NOTE — Progress Notes (Signed)
Informed Melina Copa PA that patient it on two beta blockers. Per PA since they are for two different things it is ok to continue. Informed MD that patient has 70/30 insulin 70 units ordered with meals and per patient he takes 60 units of 70/30 with breakfast and dinner and 40 units of regular insulin with every meal. PA states to keep to the 70/30 three times a day with sliding scale.

## 2016-10-30 NOTE — Progress Notes (Signed)
Patient Name: Edward [name] Date of Encounter: 10/30/2016  Primary Cardiologist: [facility] Problem List     Active Problems:   Dyslipidemia, goal LDL below 70   HTN (hypertension)   Essential tremor   Unstable angina (HCC)   Atherosclerosis of native coronary artery of native heart with unstable angina pectoris ([place])   Type 2 diabetes mellitus with circulatory disorder, with long-term current use of insulin (HCC)     Subjective   No chest pain. On nitro drip 35 mcg/min.  Inpatient Medications    Scheduled Meds: . amLODipine  10 mg Oral Daily  . aspirin EC  81 mg Oral Daily  . atorvastatin  80 mg Oral q1800  . clopidogrel  75 mg Oral Daily  . dicyclomine  10 mg Oral TID AC  . fenofibrate  160 mg Oral Daily  . fluticasone furoate-vilanterol  1 puff Inhalation Daily  . gabapentin  300 mg Oral BID  . insulin aspart  0-9 Units Subcutaneous TID WC  . insulin aspart protamine- aspart  70 Units Subcutaneous TID WC  . lisinopril  10 mg Oral Daily  . metoprolol  50 mg Oral BID  . pantoprazole  40 mg Oral BID  . propranolol  40 mg Oral TID  . ranolazine  1,000 mg Oral BID  . sodium chloride flush  3 mL Intravenous Q12H   Continuous Infusions: . heparin 1,250 Units/hr (10/30/16 0301)  . nitroGLYCERIN 35 mcg/min (10/30/16 1212)   PRN Meds: sodium chloride, acetaminophen, albuterol, clonazePAM, magnesium hydroxide, meclizine, nitroGLYCERIN, ondansetron (ZOFRAN) IV, polyvinyl alcohol, sodium chloride flush   Vital Signs    Vitals:   10/30/16 0858 10/30/16 0917 10/30/16 1046 10/30/16 1127  BP:  (!) 119/57  133/69  Pulse: 66     Resp: 18   19  Temp: 98 F (36.7 C)   97.5 F (36.4 C)  TempSrc: Oral   Oral  SpO2:   100% 100%  Weight:      Height:        Intake/Output Summary (Last 24 hours) at 10/30/16 1241 Last data filed at 10/30/16 0916  Gross per 24 hour  Intake           698.25 ml  Output             1200 ml  Net          -501.75 ml   Filed Weights     10/29/16 1741 10/29/16 1948 10/30/16 0336  Weight: 272 lb 7.8 oz (123.6 kg) 272 lb 7.8 oz (123.6 kg) 273 lb 2.4 oz (123.9 kg)    Physical Exam    GEN: Well nourished, well developed, in no acute distress.  HEENT: Grossly normal.  Neck: Supple, no JVD, carotid bruits, or masses. Cardiac: RRR, no murmurs, rubs, or gallops. No clubbing, cyanosis, edema.  Respiratory:  Diminished throughout, no rales, faint intermittent exp wheezes GI: Obese. MS: no deformity or atrophy. Skin: warm and dry, no rash. Neuro:  Strength intact. Psych: AAOx3.  Normal affect.  Labs    CBC  Recent Labs  10/29/16 0700 10/30/16 0701  WBC 5.9 5.3  HGB 12.6* 12.1*  HCT 37.5* 36.2*  MCV 84.3 85.8  PLT 231 [id number]   Basic Metabolic Panel  Recent Labs  10/29/16 0700 10/30/16 0701  NA 137 136  K 4.2 3.9  CL 102 100*  CO2 28 28  GLUCOSE 275* 316*  BUN 16 14  CREATININE 1.30* 1.26*  CALCIUM 8.7* 8.8*  Liver Function Tests  Recent Labs  10/29/16 1810  AST 15  ALT 16*  ALKPHOS 84  BILITOT 0.5  PROT 6.0*  ALBUMIN 2.9*    Recent Labs  10/29/16 1810  LIPASE 16   Cardiac Enzymes  Recent Labs  10/29/16 1810 10/30/16 0010 10/30/16 0701  TROPONINI <0.03 <0.03 <0.03   BNP Invalid input(s): POCBNP D-Dimer No results for input(s): DDIMER in the last 72 hours. Hemoglobin A1C No results for input(s): HGBA1C in the last 72 hours. Fasting Lipid Panel  Recent Labs  10/30/16 0701  CHOL 130  HDL 33*  LDLCALC 78  TRIG 93  CHOLHDL 3.9   Thyroid Function Tests  Recent Labs  10/29/16 1810  TSH 2.344    Telemetry    - Personally Reviewed  ECG     - Personally Reviewed  Radiology    Dg Chest 2 View  Result Date: 10/29/2016 CLINICAL DATA:  75 year old male with chest pain on the left radiating to the shoulder. Initial encounter. EXAM: CHEST  2 VIEW COMPARISON:  Chest CT 08/29/2016 and earlier. FINDINGS: Stable lung volumes, low normal. Normal cardiac size and  mediastinal contours. Visualized tracheal air column is within normal limits. No pneumothorax, pulmonary edema, pleural effusion or confluent pulmonary opacity. Mild thoracic scoliosis. Advanced degenerative changes at both shoulders with glenohumeral joint osteophytosis and subchondral sclerosis. No acute osseous abnormality identified. IMPRESSION: No acute cardiopulmonary abnormality. Electronically Signed   By: Genevie Ann M.D.   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  Assessment & Plan    1. Chest pain - mixed typical/atypical features. Despite prolonged pain, negative troponins, and nonacute EKG thus far. His symptoms have reminded him of prior unstable angina in 12/2015.  Last cath was in 12/2015 which revealed an 85% circumflex lesion not treatable by intervention without involving the left main. Ranexa was added with dramatic improvement in symptoms until this admission, with plans to consider PCI/CABG only if meds fail. His cath otherwise showed 40-50% distal LM with cross-sectional area by intravascular ultrasound at most severe narrowing 5.96 mm(borderline), patent mLAD stent, moderate RCA disease, moderate pulm HTN. 2D echo 03/2016: [id number], normal diastolic parameters, mild LAE.   He had seen good improvement with Ranexa up until now so question component of microvascular disease as well. He reports feeling much better than before. It is somewhat unusual that he had such prolonged severe pain but without markers of ischemia. However, given his diabetes, his presentation may not be quite so typical.  Continue nitro drip (35 mcg/min at present) and  heparin per pharmacy. Plan for coronary angiography 11/01/16.  2. CAD - with his history of known TIA (documented as right sided numbness per neurology in 2002), Effient changed to Plavix to avoid provoking intracerebral bleeding.  3. HTN - controlled. No changes.  4. Diabetes mellitus - continue Novolin 70/30 home dose. Continue IP sliding scale (can titrate up if needed).  5. Chronic diastolic CHF - CXR clear. Continue present regimen and monitor. He is only taking Lasix PRN - will hold for now in case urgent cath needed.  6. 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 Follow.  Signed, Kate Sable, MD  10/30/2016, 12:41 PM

## 2016-10-30 NOTE — Progress Notes (Signed)
Patient c/o severe dizziness, equilibrium problems, and weakness in his legs. BP was WNL. Patient states that the last time her was in the hospital the same thing happened. Called pharmacy to see if the Heparin or Nitro drip could be causing his symptoms but they didn't think so. Checked his blood glucose and it was 79. Patients states he doesn't feel good when his blood sugar is less than 90. Gave patient an Ensure and patient felt better within 15 minutes. Called Cardiologist on call to let him know of his symptoms and he agreed it was probably related to his blood sugar.  Patient stated on admission that he doesn't take the doses of insulin as prescribed by his endocrinologist. What is prescribed is 70 units of 70/30 TID with meals and a Novolog sliding scale coverage, but what he takes is Regular insulin 40 units with each meal. Patients blood glucose level was low at dinner and didn't get any insulin with dinner and was still low after eating into the evening. At home the patient is not on a low carb diet like he is here in the hospital. Will notify pharmacy/MD in am to adjust dose.

## 2016-10-30 NOTE — Progress Notes (Signed)
ANTICOAGULATION CONSULT NOTE - Follow Up Consult  Pharmacy Consult for heparin Indication: chest pain/ACS  No Known Allergies  Patient Measurements: Height: 5\' 8"  (172.7 cm) Weight: 273 lb 2.4 oz (123.9 kg) IBW/kg (Calculated) : 68.4 Heparin Dosing Weight: 95kg  Vital Signs: Temp: 97.6 F (36.4 C) (12/31 0336) Temp Source: Oral (12/31 0336) BP: 111/50 (12/31 0336) Pulse Rate: 61 (12/31 0336)  Labs:  Recent Labs  10/29/16 0700 10/29/16 1710 10/29/16 1810 10/30/16 0010 10/30/16 0701  HGB 12.6*  --   --   --  12.1*  HCT 37.5*  --   --   --  36.2*  PLT 231  --   --   --  222  APTT 34  --   --   --   --   LABPROT 13.0  --  13.9  --   --   INR 0.98  --  1.07  --   --   HEPARINUNFRC  --  0.45  --   --  0.50  CREATININE 1.30*  --   --   --  1.26*  TROPONINI  --   --  <0.03 <0.03 <0.03    Estimated Creatinine Clearance: 65.9 mL/min (by C-G formula based on SCr of 1.26 mg/dL (H)).  Assessment:  75 y/o M presents with chest pain. Pharmacy managing heparin infusion for chest pain. Heparin level remains therapeutic this AM. CBC stable. No issues noted.   Goal of Therapy:  Heparin level 0.3-0.7 units/ml Monitor platelets by anticoagulation protocol: Yes   Plan:  Continue heparin IV 1250 units/hr Daily heparin level, CBC Monitor for S&S of bleed  Angela Burke, PharmD, BCPS Pharmacy Resident Pager: [phone] 10/30/2016,8:19 AM

## 2016-10-31 LAB — CBC
HEMATOCRIT: 32.9 % — AB (ref 39.0–52.0)
HEMOGLOBIN: 11 g/dL — AB (ref 13.0–17.0)
MCH: 28.1 pg (ref 26.0–34.0)
MCHC: 33.4 g/dL (ref 30.0–36.0)
MCV: 83.9 fL (ref 78.0–100.0)
Platelets: 211 10*3/uL (ref 150–400)
RBC: 3.92 MIL/uL — AB (ref 4.22–5.81)
RDW: 13.1 % (ref 11.5–15.5)
WBC: 8.4 10*3/uL (ref 4.0–10.5)

## 2016-10-31 LAB — TROPONIN I
Troponin I: <0.03 ng/mL (ref <0.03)
Troponin I: <0.03 ng/mL (ref <0.03)

## 2016-10-31 LAB — GLUCOSE, CAPILLARY
GLUCOSE-CAPILLARY: 220 mg/dL — AB (ref 65–99)
Glucose-Capillary: 120 mg/dL — ABNORMAL HIGH (ref 65–99)
Glucose-Capillary: 213 mg/dL — ABNORMAL HIGH (ref 65–99)
Glucose-Capillary: 168 mg/dL — ABNORMAL HIGH (ref 65–99)
Glucose-Capillary: 127 mg/dL — ABNORMAL HIGH (ref 65–99)

## 2016-10-31 LAB — BASIC METABOLIC PANEL
ANION GAP: 8 (ref 5–15)
BUN: 20 mg/dL (ref 6–20)
CHLORIDE: 101 mmol/L (ref 101–111)
CO2: 26 mmol/L (ref 22–32)
CREATININE: 1.64 mg/dL — AB (ref 0.61–1.24)
Calcium: 8.8 mg/dL — ABNORMAL LOW (ref 8.9–10.3)
GFR calc non Af Amer: 40 mL/min — ABNORMAL LOW (ref >60)
GFR, EST AFRICAN AMERICAN: 46 mL/min — AB (ref >60)
Glucose, Bld: 217 mg/dL — ABNORMAL HIGH (ref 65–99)
POTASSIUM: 4.2 mmol/L (ref 3.5–5.1)
Sodium: 135 mmol/L (ref 135–145)

## 2016-10-31 LAB — HEPARIN LEVEL (UNFRACTIONATED)
HEPARIN UNFRACTIONATED: 0.75 [IU]/mL — AB (ref 0.30–0.70)
Heparin Unfractionated: 0.75 IU/mL — ABNORMAL HIGH (ref 0.30–0.70)

## 2016-10-31 MED ORDER — SODIUM CHLORIDE 0.9% FLUSH
3.0000 mL | Freq: Two times a day (BID) | INTRAVENOUS | Status: DC
  Administered 2016-11-01 (×2): 3 mL via INTRAVENOUS

## 2016-10-31 MED ORDER — ASPIRIN 81 MG PO CHEW
81.0000 mg | CHEWABLE_TABLET | ORAL | Status: AC
  Administered 2016-11-01: 81 mg via ORAL
  Filled 2016-10-31: qty 1

## 2016-10-31 MED ORDER — INSULIN ASPART PROT & ASPART (70-30 MIX) 100 UNIT/ML ~~LOC~~ SUSP
60.0000 [IU] | Freq: Two times a day (BID) | SUBCUTANEOUS | Status: DC
  Administered 2016-10-31 – 2016-11-07 (×12): 60 [IU] via SUBCUTANEOUS
  Filled 2016-10-31 (×2): qty 10

## 2016-10-31 MED ORDER — ISOSORBIDE MONONITRATE ER 60 MG PO TB24
90.0000 mg | ORAL_TABLET | Freq: Every day | ORAL | Status: DC
  Administered 2016-10-31 – 2016-11-04 (×5): 90 mg via ORAL
  Filled 2016-10-31 (×6): qty 1

## 2016-10-31 MED ORDER — SODIUM CHLORIDE 0.9 % IV SOLN: 250.0000 mL | INTRAVENOUS | Status: DC | PRN

## 2016-10-31 MED ORDER — SODIUM CHLORIDE 0.9 % IV SOLN
INTRAVENOUS | Status: DC
  Administered 2016-10-31: 20:00:00 via INTRAVENOUS

## 2016-10-31 MED ORDER — INSULIN ASPART 100 UNIT/ML ~~LOC~~ SOLN
0.0000 [IU] | Freq: Three times a day (TID) | SUBCUTANEOUS | Status: DC
  Administered 2016-10-31: 4 [IU] via SUBCUTANEOUS
  Administered 2016-10-31: 7 [IU] via SUBCUTANEOUS
  Administered 2016-11-01: 11 [IU] via SUBCUTANEOUS
  Administered 2016-11-01 (×2): 7 [IU] via SUBCUTANEOUS
  Administered 2016-11-02: 15 [IU] via SUBCUTANEOUS
  Administered 2016-11-02: 11 [IU] via SUBCUTANEOUS
  Administered 2016-11-03: 4 [IU] via SUBCUTANEOUS
  Administered 2016-11-03: 3 [IU] via SUBCUTANEOUS
  Administered 2016-11-04 – 2016-11-06 (×5): 7 [IU] via SUBCUTANEOUS
  Administered 2016-11-06 – 2016-11-07 (×3): 4 [IU] via SUBCUTANEOUS
  Administered 2016-11-07: 15 [IU] via SUBCUTANEOUS
  Administered 2016-11-07: 4 [IU] via SUBCUTANEOUS
  Administered 2016-11-08: 7 [IU] via SUBCUTANEOUS
  Administered 2016-11-08 (×2): 4 [IU] via SUBCUTANEOUS

## 2016-10-31 MED ORDER — SODIUM CHLORIDE 0.9% FLUSH: 3.0000 mL | INTRAVENOUS | Status: DC | PRN

## 2016-10-31 NOTE — Progress Notes (Signed)
Patient Name: Edward [name] Date of Encounter: 10/31/2016  Primary Cardiologist: [facility] Problem List     Active Problems:   Dyslipidemia, goal LDL below 70   HTN (hypertension)   Essential tremor   Unstable angina (HCC)   Atherosclerosis of native coronary artery of native heart with unstable angina pectoris ([place])   Type 2 diabetes mellitus with circulatory disorder, with long-term current use of insulin (HCC)     Subjective   Minimal chest pain this morning (0.5/10).  CP has never completely resolved since admission.  No shortness of breath or swelling.  Patient notes intermittent sharp pains radiating from left groin to the anterolateral thigh.  This is not related to activity or exertion.  Patient felt dizzy last night and attributed this to borderline low blood sugar.  Symptoms resolved with Ensure. Patient reports taking 70/30 60 units BID and 40 units of regular insulin TIDWC at home.  Inpatient Medications    Scheduled Meds: . amLODipine  10 mg Oral Daily  . aspirin EC  81 mg Oral Daily  . atorvastatin  80 mg Oral q1800  . clopidogrel  75 mg Oral Daily  . dicyclomine  10 mg Oral TID AC  . fenofibrate  160 mg Oral Daily  . fluticasone furoate-vilanterol  1 puff Inhalation Daily  . gabapentin  300 mg Oral BID  . insulin aspart  0-9 Units Subcutaneous TID WC  . insulin aspart protamine- aspart  70 Units Subcutaneous TID WC  . lisinopril  10 mg Oral Daily  . pantoprazole  40 mg Oral BID  . propranolol  40 mg Oral TID  . ranolazine  1,000 mg Oral BID  . sodium chloride flush  3 mL Intravenous Q12H   Continuous Infusions: . heparin 1,100 Units/hr (10/31/16 0900)  . nitroGLYCERIN 23 mcg/min (10/31/16 0908)   PRN Meds: sodium chloride, acetaminophen, albuterol, clonazePAM, magnesium hydroxide, meclizine, nitroGLYCERIN, ondansetron (ZOFRAN) IV, polyvinyl alcohol, sodium chloride flush   Vital Signs    Vitals:   10/31/16 0600 10/31/16 0630 10/31/16 0700  10/31/16 0853  BP: (!) 105/44 (!) 106/47 (!) 105/50 (!) 128/50  Pulse:      Resp:    16  Temp:   97.6 F (36.4 C)   TempSrc:   Oral   SpO2: 96% 96% 96% 98%  Weight:      Height:        Intake/Output Summary (Last 24 hours) at 10/31/16 1014 Last data filed at 10/31/16 0900  Gross per 24 hour  Intake          1176.47 ml  Output              875 ml  Net           301.47 ml   Filed Weights   10/29/16 1948 10/30/16 0336 10/31/16 0432  Weight: 123.6 kg (272 lb 7.8 oz) 123.9 kg (273 lb 2.4 oz) 120.8 kg (266 lb 5.1 oz)    Physical Exam    GEN: Obese man, lying comfortably in bed. HEENT: Grossly normal.  Neck: Supple, no JVD, or HJR. Cardiac: RRR, no murmurs, rubs, or gallops. No clubbing, cyanosis, edema. 2+ radial, left femoral and bilateral pedal pulses. Respiratory:  Mildly diminished without wheezes or crackles.  Normal work of breathing. GI: Obese. Soft, NT/ND.  Unable to assess HSM due to body habitus. Extremities: Left groin without evidence of hernia.  No left groing/thigh rash or tenderness. Skin: warm and dry, no rash. Neuro:  Strength intact. Psych: AAOx3.  Normal affect.  Labs    CBC  Recent Labs  10/30/16 0701 10/31/16 0218  WBC 5.3 8.4  HGB 12.1* 11.0*  HCT 36.2* 32.9*  MCV 85.8 83.9  PLT 222 [id number]   Basic Metabolic Panel  Recent Labs  10/30/16 0701 10/31/16 0218  NA 136 135  K 3.9 4.2  CL 100* 101  CO2 28 26  GLUCOSE 316* 217*  BUN 14 20  CREATININE 1.26* 1.64*  CALCIUM 8.8* 8.8*   Liver Function Tests  Recent Labs  10/29/16 1810  AST 15  ALT 16*  ALKPHOS 84  BILITOT 0.5  PROT 6.0*  ALBUMIN 2.9*    Recent Labs  10/29/16 1810  LIPASE 16   Cardiac Enzymes  Recent Labs  10/29/16 1810 10/30/16 0010 10/30/16 0701  TROPONINI <0.03 <0.03 <0.03   BNP Invalid input(s): POCBNP D-Dimer No results for input(s): DDIMER in the last 72 hours. Hemoglobin A1C No results for input(s): HGBA1C in the last 72 hours. Fasting Lipid  Panel  Recent Labs  10/30/16 0701  CHOL 130  HDL 33*  LDLCALC 78  TRIG 93  CHOLHDL 3.9   Thyroid Function Tests  Recent Labs  10/29/16 1810  TSH 2.344    Telemetry    Normal sinus rhythm with artifact.- Personally Reviewed  ECG    10/30/16: Sinus arrhythmia with 1st degree AV block and poor R-wave progression. - Personally Reviewed  Radiology    No results found.  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  Assessment & Plan    1. Chest pain - mixed typical/atypical features. Despite prolonged pain, negative troponins, and nonacute EKG thus far. His symptoms have reminded him of prior unstable angina in 12/2015. He has minimal pain at this time on NTG infusion. Last cath was in 12/2015 which revealed an 85% circumflex lesion not treatable by intervention without involving the left main. Ranexa was added with dramatic improvement in symptoms until this admission, with plans to consider PCI/CABG only if meds fail. His cath otherwise showed 40-50% distal LM with cross-sectional area by intravascular ultrasound at most severe narrowing 5.96 mm(borderline), patent mLAD stent, moderate RCA disease, moderate pulm HTN. 2D echo 03/2016: Ef [id number], normal diastolic parameters, mild LAE.  He had seen good improvement with Ranexa up until now so question component of microvascular disease as well. We will add back isosorbide mononitrate and wean off NTG infusion.  We will continue heparin infusion and clopidogrel pending catheterization.  We will tentatively plan for [facility] tomorrow if his renal function has returned to baseline. I have reviewed the risks, indications, and alternatives  to cardiac catheterization, possible angioplasty, and stenting with the patient. Risks include but are not limited to bleeding, infection, vascular injury, stroke, myocardial infection, arrhythmia, kidney injury, radiation-related injury in the case of prolonged fluoroscopy use, emergency cardiac surgery, and death. The patient understands the risks of serious complication is 1-2 in [id number] with diagnostic cardiac cath and 1-2% or less with angioplasty/stenting.  2. CAD - with his history of known TIA (documented as right sided numbness per neurology in 2002), Effient changed to Plavix to avoid provoking intracerebral bleeding.  3. HTN - controlled. Hold  lisinopril given AKI and plan for [facility] tomorrow.  4. Diabetes mellitus - Patient symptomatic with borderline low glucose last night.  He has been taking insulin differently at home than indicated in Epic (70/30 60 units BID and regular insulin 40 units TID with meals).  Will switch 70/30 insulin to 60 units BID with means and change to resistant SSI.  5. Chronic diastolic CHF - CXR clear. Continue present regimen and monitor. He is only taking Lasix PRN - will hold for now.  Repeat echo to evaluate for decline in LVEF given unstable angina and borderline LMCA disease on prior LHC.  6. Acute kidney injury on chronic kidney disease: Creatinine increased from 1.26 to 1.64.  Will hold lisinopril and hydrate gently overnight.  Plan to recheck creatinine in the early morning to determine if safe to proceed with LHC.   Signed, Nelva Bush, MD  10/31/2016, 10:14 AM

## 2016-10-31 NOTE — Progress Notes (Signed)
ANTICOAGULATION CONSULT NOTE  Pharmacy Consult for heparin Indication: chest pain/ACS  No Known Allergies  Patient Measurements: Height: 5\' 8"  (172.7 cm) Weight: 266 lb 5.1 oz (120.8 kg) IBW/kg (Calculated) : 68.4 Heparin Dosing Weight: 95kg  Vital Signs: Temp: 97.2 F (36.2 C) (01/01 1100) Temp Source: Oral (01/01 1100) BP: 135/49 (01/01 1147) Pulse Rate: 59 (01/01 0432)  Labs:  Recent Labs  10/29/16 0700  10/29/16 1810 10/30/16 0010 10/30/16 0701 10/31/16 0218 10/31/16 1304  HGB 12.6*  --   --   --  12.1* 11.0*  --   HCT 37.5*  --   --   --  36.2* 32.9*  --   PLT 231  --   --   --  222 211  --   APTT 34  --   --   --   --   --   --   LABPROT 13.0  --  13.9  --   --   --   --   INR 0.98  --  1.07  --   --   --   --   HEPARINUNFRC  --   < >  --   --  0.50 0.75* 0.75*  CREATININE 1.30*  --   --   --  1.26* 1.64*  --   TROPONINI  --   < > <0.03 <0.03 <0.03  --  <0.03  < > = values in this interval not displayed.  Estimated Creatinine Clearance: 50 mL/min (by C-G formula based on SCr of 1.64 mg/dL (H)).  Assessment: 76 y/o M presents with chest pain. Pharmacy managing heparin infusion for chest pain. Heparin remains slightly supratherapeutic despite rate adjustment this morning. No issues with infusion. CBC stable.   Goal of Therapy:  Heparin level 0.3-0.7 units/ml Monitor platelets by anticoagulation protocol: Yes    Plan:  -Reduce heparin to 950 units/hr -Check level tonight -Daily HL, CBC -Cath tomorrow    Hughes Better, PharmD, BCPS Clinical Pharmacist 10/31/2016 3:05 PM

## 2016-10-31 NOTE — Progress Notes (Signed)
ANTICOAGULATION CONSULT NOTE - Follow Up Consult  Pharmacy Consult for heparin Indication: chest pain/ACS  Labs:  Recent Labs  10/29/16 0700 10/29/16 1710 10/29/16 1810 10/30/16 0010 10/30/16 0701 10/31/16 0218  HGB 12.6*  --   --   --  12.1* 11.0*  HCT 37.5*  --   --   --  36.2* 32.9*  PLT 231  --   --   --  222 211  APTT 34  --   --   --   --   --   LABPROT 13.0  --  13.9  --   --   --   INR 0.98  --  1.07  --   --   --   HEPARINUNFRC  --  0.45  --   --  0.50 0.75*  CREATININE 1.30*  --   --   --  1.26* 1.64*  TROPONINI  --   --  <0.03 <0.03 <0.03  --     Assessment: 76yo male now above goal on heparin after two levels at goal.  Goal of Therapy:  Heparin level 0.3-0.7 units/ml   Plan:  Will decrease heparin gtt by 1 unit/kg/hr to 1100 units/hr and check level in [place], PharmD, BCPS  10/31/2016,3:40 AM

## 2016-10-31 NOTE — Significant Event (Signed)
Called to patient bedside, patient complaining of chest pain and discomfort 2/10.  Pain located in mid chest and starting to migrate to left arm.  NTG remains infusing currently at 25 mcg/hr.    Patient reqeusts tylenol, given per patient request.  BP (!) 117/48 (BP Location: Right Arm)   Pulse (!) 59   Temp 97.2 F (36.2 C) (Oral)   Resp 15   Ht 5\' 8"  (1.727 m)   Wt 120.8 kg (266 lb 5.1 oz)   SpO2 97%   BMI 40.49 kg/m   EKG ordered stat, no EKG changes noted on monitor.  Will continue to monitor closely        Kaetlyn Noa, [employer]

## 2016-10-31 NOTE — Progress Notes (Signed)
  Called by RN. Patient's chest pain increased after IV NTG was stopped.  He was given SL NTG x 2 with improvement.  Plan was to start oral nitrates, but the medicine has not been brought to the floor yet. ECG done during chest pain was reviewed by me and there are no changes. PLAN: Resume IV NTG until oral nitrates available, then ween off and start Imdur. Check Troponin x 3. Continue to monitor. Richardson Dopp, PA-C   10/31/2016 12:06 PM

## 2016-11-01 DIAGNOSIS — G250 Essential tremor: Secondary | ICD-10-CM

## 2016-11-01 LAB — GLUCOSE, CAPILLARY
GLUCOSE-CAPILLARY: 281 mg/dL — AB (ref 65–99)
Glucose-Capillary: 206 mg/dL — ABNORMAL HIGH (ref 65–99)
Glucose-Capillary: 232 mg/dL — ABNORMAL HIGH (ref 65–99)

## 2016-11-01 LAB — HEPARIN LEVEL (UNFRACTIONATED)
Heparin Unfractionated: 0.62 IU/mL (ref 0.30–0.70)
Heparin Unfractionated: 0.53 IU/mL (ref 0.30–0.70)

## 2016-11-01 LAB — BASIC METABOLIC PANEL
Anion gap: 6 (ref 5–15)
BUN: 23 mg/dL — AB (ref 6–20)
CO2: 27 mmol/L (ref 22–32)
Calcium: 8.5 mg/dL — ABNORMAL LOW (ref 8.9–10.3)
Chloride: 102 mmol/L (ref 101–111)
Creatinine, Ser: 1.61 mg/dL — ABNORMAL HIGH (ref 0.61–1.24)
GFR calc Af Amer: 47 mL/min — ABNORMAL LOW (ref >60)
GFR, EST NON AFRICAN AMERICAN: 40 mL/min — AB (ref >60)
GLUCOSE: 223 mg/dL — AB (ref 65–99)
POTASSIUM: 4.3 mmol/L (ref 3.5–5.1)
Sodium: 135 mmol/L (ref 135–145)

## 2016-11-01 LAB — CBC
HCT: 32.6 % — ABNORMAL LOW (ref 39.0–52.0)
Hemoglobin: 11 g/dL — ABNORMAL LOW (ref 13.0–17.0)
MCH: 28.5 pg (ref 26.0–34.0)
MCHC: 33.7 g/dL (ref 30.0–36.0)
MCV: 84.5 fL (ref 78.0–100.0)
PLATELETS: 212 10*3/uL (ref 150–400)
RBC: 3.86 MIL/uL — AB (ref 4.22–5.81)
RDW: 13.2 % (ref 11.5–15.5)
WBC: 7.4 10*3/uL (ref 4.0–10.5)

## 2016-11-01 LAB — TROPONIN I

## 2016-11-01 MED ORDER — SODIUM CHLORIDE 0.9 % IV SOLN
INTRAVENOUS | Status: DC
  Administered 2016-11-01 (×2): via INTRAVENOUS

## 2016-11-01 NOTE — Progress Notes (Signed)
Complained of  dull chest pain scale of 3. bp- 142/61. Nitro 0.4 gr sl given with relief after 5 min. Continue to monitor.

## 2016-11-01 NOTE — Progress Notes (Signed)
Inpatient Diabetes Program Recommendations  AACE/ADA: New Consensus Statement on Inpatient Glycemic Control (2015)  Target Ranges:  Prepandial:   less than 140 mg/dL      Peak postprandial:   less than 180 mg/dL (1-2 hours)      Critically ill patients:  140 - 180 mg/dL  Results for JAIVIAN, GUILLOT (MRN [id number]) as of 11/01/2016 10:45  Ref. Range 10/31/2016 07:35 10/31/2016 11:23 10/31/2016 15:57 10/31/2016 21:42 11/01/2016 07:46  Glucose-Capillary Latest Ref Range: 65 - 99 mg/dL 213 (H)  70/30 70 units  Novolog 3 units 220 (H)  Novolog 7 units 168 (H)  70/30 60 units  Novolog 4 units 127 (H) 206 (H)    Review of Glycemic Control  Diabetes history: DM2 Outpatient Diabetes medications: 70/30 70 units TID with meals, Regular 20-36 units TID with meals Current orders for Inpatient glycemic control: 70/30 60 units BID, Novolog 0-20 units TID with meals  Inpatient Diabetes Program Recommendations: Insulin - Basal: Please consider changing 70/30 to 75 units QAM with breakfast and 62 units with supper. Correction (SSI): Please consider ordering Novolog bedtime correction scale.  NOTE: In reviewing the chart, noted patient is followed by Dr. Cruzita Lederer (Endocrinologist) and last saw Dr. Cruzita Lederer on 09/13/16. Per Dr. Arman Filter office note on 09/13/16 patient use to be on Humulin R U500 (concentrated insulin) but had to switch back to 70/30 and Regular U-100 due to cost and patient has been self-adjusting insulin. According to Dr. Arman Filter office note on 09/13/16 patient is prescribed 70/30 80 units with breakfast, 70/30 70 units with lunch, and 70/30 60 units with supper.   Thanks, Barnie Alderman, RN, MSN, CDE Diabetes Coordinator Inpatient Diabetes Program [phone] (Team Pager from 8am to 5pm)

## 2016-11-01 NOTE — Progress Notes (Signed)
Patient Name: Edward [name] Date of Encounter: 11/01/2016  Primary Cardiologist: [facility] Blenda Bridegroom, M.D.  Hospital Problem List     Active Problems:   Dyslipidemia, goal LDL below 70   HTN (hypertension)   Essential tremor   Unstable angina (HCC)   Atherosclerosis of native coronary artery of native heart with unstable angina pectoris (HCC)   Type 2 diabetes mellitus with circulatory disorder, with long-term current use of insulin (HCC)     Subjective   No recurrence of chest discomfort. He denies dyspnea. Angiography planned for today has been deferred because of acute on chronic kidney injury.  Inpatient Medications    Scheduled Meds: . amLODipine  10 mg Oral Daily  . aspirin EC  81 mg Oral Daily  . atorvastatin  80 mg Oral q1800  . clopidogrel  75 mg Oral Daily  . dicyclomine  10 mg Oral TID AC  . fenofibrate  160 mg Oral Daily  . fluticasone furoate-vilanterol  1 puff Inhalation Daily  . gabapentin  300 mg Oral BID  . insulin aspart  0-20 Units Subcutaneous TID WC  . insulin aspart protamine- aspart  60 Units Subcutaneous BID WC  . isosorbide mononitrate  90 mg Oral Daily  . pantoprazole  40 mg Oral BID  . propranolol  40 mg Oral TID  . ranolazine  1,000 mg Oral BID  . sodium chloride flush  3 mL Intravenous Q12H  . sodium chloride flush  3 mL Intravenous Q12H   Continuous Infusions: . sodium chloride 75 mL/hr at 10/31/16 2010  . heparin 950 Units/hr (11/01/16 0019)   PRN Meds: sodium chloride, sodium chloride, acetaminophen, albuterol, clonazePAM, magnesium hydroxide, meclizine, nitroGLYCERIN, ondansetron (ZOFRAN) IV, polyvinyl alcohol, sodium chloride flush, sodium chloride flush   Vital Signs    Vitals:   11/01/16 0400 11/01/16 0623 11/01/16 0700 11/01/16 0944  BP: (!) 109/40  (!) 89/75 (!) 148/53  Pulse:   (!) 59 64  Resp:   16   Temp:   97.8 F (36.6 C)   TempSrc:   Oral   SpO2: 98%  100%   Weight:  276 lb 0.3 oz (125.2 kg)    Height:         Intake/Output Summary (Last 24 hours) at 11/01/16 1004 Last data filed at 11/01/16 0900  Gross per 24 hour  Intake          1354.14 ml  Output             1450 ml  Net           -95.86 ml   Filed Weights   10/30/16 0336 10/31/16 0432 11/01/16 0623  Weight: 273 lb 2.4 oz (123.9 kg) 266 lb 5.1 oz (120.8 kg) 276 lb 0.3 oz (125.2 kg)    Physical Exam   Obese. In no acute distress. GEN: Well nourished, well developed, in no acute distress.  HEENT: Grossly normal.  Neck: Supple, no JVD, carotid bruits, or masses. Cardiac: RRR. There is a 2/6 systolic murmur at the right upper sternal border. No rub or gallop. No clubbing, cyanosis, edema.  Radials/DP/PT 2+ and equal bilaterally.  Respiratory:  Respirations regular and unlabored, clear to auscultation bilaterally. GI: Soft, nontender, nondistended, BS + x 4. MS: no deformity or atrophy. Skin: warm and dry, no rash. Neuro:  Strength and sensation are intact. Psych: AAOx3.  Normal affect.  Labs    CBC  Recent Labs  10/31/16 0218 11/01/16 0309  WBC 8.4 7.4  HGB 11.0* 11.0*  HCT 32.9* 32.6*  MCV 83.9 84.5  PLT 211 [id number]   Basic Metabolic Panel  Recent Labs  10/31/16 0218 11/01/16 0309  NA 135 135  K 4.2 4.3  CL 101 102  CO2 26 27  GLUCOSE 217* 223*  BUN 20 23*  CREATININE 1.64* 1.61*  CALCIUM 8.8* 8.5*   Liver Function Tests  Recent Labs  10/29/16 1810  AST 15  ALT 16*  ALKPHOS 84  BILITOT 0.5  PROT 6.0*  ALBUMIN 2.9*    Recent Labs  10/29/16 1810  LIPASE 16   Cardiac Enzymes  Recent Labs  10/31/16 1304 10/31/16 1745 10/31/16 2324  TROPONINI <0.03 <0.03 <0.03   BNP Invalid input(s): POCBNP D-Dimer No results for input(s): DDIMER in the last 72 hours. Hemoglobin A1C No results for input(s): HGBA1C in the last 72 hours. Fasting Lipid Panel  Recent Labs  10/30/16 0701  CHOL 130  HDL 33*  LDLCALC 78  TRIG 93  CHOLHDL 3.9   Thyroid Function Tests  Recent Labs  10/29/16 1810   TSH 2.344    Telemetry    Normal sinus rhythm with occasional PVCs. - Personally Reviewed  ECG    Normal sinus rhythm with first-degree AV block. QS pattern V1 through V4. - Personally Reviewed  Radiology    No results found.  Cardiac Studies   No new data  Patient Profile     76 year old gentleman with type 2 diabetes, hyperlipidemia, coronary artery disease with prior LAD stent and high-grade ostial circumflex with moderate distal left main (40-50%) who presents on this occasion with accelerating episodes of chest discomfort felt to represent angina. No ischemic EKG changes or cardiac marker evidence of myocardial injury. The plan is for repeat coronary angiography to rule out progression of distal left main and circumflex disease. Previously seen and family were 2017 by Dr. Modesto Charon and the decision made at that time to treat medically and if therapy becomes inadequate to control symptoms, consider high risk PCI versus coronary grafting  Assessment & Plan    1. Native coronary artery disease with known 40-50% distal left main and 90+ percent stenosis in the ostial circumflex. Patent LAD stents. Presents now with progressive chest pain syndrome and no evidence of ischemia on EKG or by cardiac markers. 2. Chest pain, possibly ischemic. Plan coronary angiography. Today's procedure was canceled because of acute on chronic kidney injury. We'll hydrate overnight and reschedule for tomorrow (scheduled for first case). Basic metabolic panel will be done stat at 5:30 AM  3. 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 IV hydration overnight with plan to perform coronary angiography on 11/02/16. Plan to hold diuretic therapy.  4. Type 2 diabetes, with moderate glycemic control  Signed, Sinclair Grooms, MD  11/01/2016, 10:04 AM

## 2016-11-01 NOTE — Progress Notes (Signed)
ANTICOAGULATION CONSULT NOTE  Pharmacy Consult for heparin Indication: chest pain/ACS  No Known Allergies  Patient Measurements: Height: 5\' 8"  (172.7 cm) Weight: 266 lb 5.1 oz (120.8 kg) IBW/kg (Calculated) : 68.4 Heparin Dosing Weight: 95kg  Vital Signs: Temp: 97.4 F (36.3 C) (01/01 2032) Temp Source: Oral (01/01 2032) BP: 93/46 (01/01 2138) Pulse Rate: 55 (01/01 2138)  Labs:  Recent Labs  10/29/16 0700  10/29/16 1810  10/30/16 0701 10/31/16 0218 10/31/16 1304 10/31/16 1745 10/31/16 2324  HGB 12.6*  --   --   --  12.1* 11.0*  --   --   --   HCT 37.5*  --   --   --  36.2* 32.9*  --   --   --   PLT 231  --   --   --  222 211  --   --   --   APTT 34  --   --   --   --   --   --   --   --   LABPROT 13.0  --  13.9  --   --   --   --   --   --   INR 0.98  --  1.07  --   --   --   --   --   --   HEPARINUNFRC  --   < >  --   --  0.50 0.75* 0.75*  --  0.62  CREATININE 1.30*  --   --   --  1.26* 1.64*  --   --   --   TROPONINI  --   --  <0.03  < > <0.03  --  <0.03 <0.03 <0.03  < > = values in this interval not displayed.  Estimated Creatinine Clearance: 50 mL/min (by C-G formula based on SCr of 1.64 mg/dL (H)).  Assessment: 76 y/o M presents with chest pain. Pharmacy managing heparin infusion for chest pain. Heparin now therapeutic (0.62) on gtt at 950 units/hr. Plan for cath today. No bleeding noted.  Goal of Therapy:  Heparin level 0.3-0.7 units/ml Monitor platelets by anticoagulation protocol: Yes    Plan:  -Continue heparin 950 units/hr -F/u a.m. heparin level  Sherlon Handing, PharmD, BCPS Clinical pharmacist, pager [phone] 11/01/2016 12:30 AM

## 2016-11-01 NOTE — Progress Notes (Signed)
ANTICOAGULATION CONSULT NOTE  Pharmacy Consult for heparin Indication: chest pain/ACS  No Known Allergies  Patient Measurements: Height: 5\' 8"  (172.7 cm) Weight: 276 lb 0.3 oz (125.2 kg) IBW/kg (Calculated) : 68.4 Heparin Dosing Weight: 95kg  Vital Signs: Temp: 97.8 F (36.6 C) (01/02 0700) Temp Source: Oral (01/02 0700) BP: 89/75 (01/02 0700) Pulse Rate: 59 (01/02 0700)  Labs:  Recent Labs  10/29/16 1810  10/30/16 0701 10/31/16 0218 10/31/16 1304 10/31/16 1745 10/31/16 2324 11/01/16 0309  HGB  --   < > 12.1* 11.0*  --   --   --  11.0*  HCT  --   --  36.2* 32.9*  --   --   --  32.6*  PLT  --   --  222 211  --   --   --  212  LABPROT 13.9  --   --   --   --   --   --   --   INR 1.07  --   --   --   --   --   --   --   HEPARINUNFRC  --   --  0.50 0.75* 0.75*  --  0.62 0.53  CREATININE  --   --  1.26* 1.64*  --   --   --  1.61*  TROPONINI <0.03  < > <0.03  --  <0.03 <0.03 <0.03  --   < > = values in this interval not displayed.  Estimated Creatinine Clearance: 51.9 mL/min (by C-G formula based on SCr of 1.61 mg/dL (H)).  Assessment: 75 y/o M presents with chest pain. Pharmacy consulted to dose heparin. Heparin level remains therapeutic today at 0.53. Plan for cath today. CBC stable, no bleeding noted.  Goal of Therapy:  Heparin level 0.3-0.7 units/ml Monitor platelets by anticoagulation protocol: Yes    Plan:  Continue heparin at 950 units/hr Daily heparin level, CBC Monitor for s/sx of bleeding   Gwenlyn Perking, PharmD PGY1 Pharmacy Resident Pager: [phone] 11/01/2016 9:32 AM

## 2016-11-02 ENCOUNTER — Encounter (HOSPITAL_COMMUNITY)
Admission: EM | Disposition: A | Discharge status: Skilled Nursing Facility | Payer: Self-pay | Source: Home / Self Care | Attending: Surgery

## 2016-11-02 ENCOUNTER — Encounter (HOSPITAL_COMMUNITY): Payer: Self-pay | Admitting: Internal Medicine

## 2016-11-02 ENCOUNTER — Inpatient Hospital Stay (HOSPITAL_COMMUNITY): Payer: Medicare Other

## 2016-11-02 DIAGNOSIS — R079 Chest pain, unspecified: Secondary | ICD-10-CM

## 2016-11-02 HISTORY — PX: CARDIAC CATHETERIZATION: SHX172

## 2016-11-02 LAB — BASIC METABOLIC PANEL
Anion gap: 8 (ref 5–15)
BUN: 15 mg/dL (ref 6–20)
CALCIUM: 8.6 mg/dL — AB (ref 8.9–10.3)
CHLORIDE: 107 mmol/L (ref 101–111)
CO2: 26 mmol/L (ref 22–32)
CREATININE: 1.32 mg/dL — AB (ref 0.61–1.24)
GFR calc non Af Amer: 51 mL/min — ABNORMAL LOW (ref >60)
GFR, EST AFRICAN AMERICAN: 60 mL/min — AB (ref >60)
Glucose, Bld: 98 mg/dL (ref 65–99)
Potassium: 4.5 mmol/L (ref 3.5–5.1)
SODIUM: 141 mmol/L (ref 135–145)

## 2016-11-02 LAB — GLUCOSE, CAPILLARY
GLUCOSE-CAPILLARY: 257 mg/dL — AB (ref 65–99)
Glucose-Capillary: 111 mg/dL — ABNORMAL HIGH (ref 65–99)
Glucose-Capillary: 305 mg/dL — ABNORMAL HIGH (ref 65–99)
Glucose-Capillary: 174 mg/dL — ABNORMAL HIGH (ref 65–99)

## 2016-11-02 LAB — CBC
HCT: 35.6 % — ABNORMAL LOW (ref 39.0–52.0)
Hemoglobin: 11.9 g/dL — ABNORMAL LOW (ref 13.0–17.0)
MCH: 28.5 pg (ref 26.0–34.0)
MCHC: 33.4 g/dL (ref 30.0–36.0)
MCV: 85.2 fL (ref 78.0–100.0)
PLATELETS: 243 10*3/uL (ref 150–400)
RBC: 4.18 MIL/uL — ABNORMAL LOW (ref 4.22–5.81)
RDW: 13.3 % (ref 11.5–15.5)
WBC: 8.3 10*3/uL (ref 4.0–10.5)

## 2016-11-02 LAB — HEPARIN LEVEL (UNFRACTIONATED)
Heparin Unfractionated: 0.15 IU/mL — ABNORMAL LOW (ref 0.30–0.70)
Heparin Unfractionated: 0.26 IU/mL — ABNORMAL LOW (ref 0.30–0.70)

## 2016-11-02 LAB — ECHOCARDIOGRAM COMPLETE
HEIGHTINCHES: 68 in
WEIGHTICAEL: 4380.98 [oz_av]

## 2016-11-02 SURGERY — LEFT HEART CATH AND CORONARY ANGIOGRAPHY
Anesthesia: LOCAL

## 2016-11-02 MED ORDER — LIDOCAINE HCL (PF) 1 % IJ SOLN
INTRAMUSCULAR | Status: AC
  Filled 2016-11-02: qty 30

## 2016-11-02 MED ORDER — SODIUM CHLORIDE 0.9% FLUSH: 3.0000 mL | INTRAVENOUS | Status: DC | PRN

## 2016-11-02 MED ORDER — HEPARIN SODIUM (PORCINE) 1000 UNIT/ML IJ SOLN
INTRAMUSCULAR | Status: AC
  Filled 2016-11-02: qty 1

## 2016-11-02 MED ORDER — FENTANYL CITRATE (PF) 100 MCG/2ML IJ SOLN
INTRAMUSCULAR | Status: AC
  Filled 2016-11-02: qty 2

## 2016-11-02 MED ORDER — FENTANYL CITRATE (PF) 100 MCG/2ML IJ SOLN
INTRAMUSCULAR | Status: DC | PRN
  Administered 2016-11-02: 50 ug via INTRAVENOUS

## 2016-11-02 MED ORDER — HEPARIN (PORCINE) IN NACL 2-0.9 UNIT/ML-% IJ SOLN
INTRAMUSCULAR | Status: DC | PRN
  Administered 2016-11-02: 1000 mL

## 2016-11-02 MED ORDER — VERAPAMIL HCL 2.5 MG/ML IV SOLN
INTRAVENOUS | Status: DC | PRN
  Administered 2016-11-02: 10 mL via INTRA_ARTERIAL

## 2016-11-02 MED ORDER — SODIUM CHLORIDE 0.9 % IV SOLN: INTRAVENOUS | Status: AC

## 2016-11-02 MED ORDER — HEPARIN (PORCINE) IN NACL 2-0.9 UNIT/ML-% IJ SOLN
INTRAMUSCULAR | Status: AC
  Filled 2016-11-02: qty 1500

## 2016-11-02 MED ORDER — MIDAZOLAM HCL 2 MG/2ML IJ SOLN
INTRAMUSCULAR | Status: AC
  Filled 2016-11-02: qty 2

## 2016-11-02 MED ORDER — SODIUM CHLORIDE 0.9% FLUSH
3.0000 mL | Freq: Two times a day (BID) | INTRAVENOUS | Status: DC
  Administered 2016-11-02 (×2): 3 mL via INTRAVENOUS

## 2016-11-02 MED ORDER — IOPAMIDOL (ISOVUE-370) INJECTION 76%
INTRAVENOUS | Status: DC | PRN
  Administered 2016-11-02: 50 mL via INTRA_ARTERIAL

## 2016-11-02 MED ORDER — MIDAZOLAM HCL 2 MG/2ML IJ SOLN
INTRAMUSCULAR | Status: DC | PRN
  Administered 2016-11-02: 1 mg via INTRAVENOUS

## 2016-11-02 MED ORDER — HEPARIN SODIUM (PORCINE) 1000 UNIT/ML IJ SOLN
INTRAMUSCULAR | Status: DC | PRN
  Administered 2016-11-02: 5000 [IU] via INTRAVENOUS

## 2016-11-02 MED ORDER — SODIUM CHLORIDE 0.9 % IV SOLN: 250.0000 mL | INTRAVENOUS | Status: DC | PRN

## 2016-11-02 MED ORDER — LIDOCAINE HCL (PF) 1 % IJ SOLN
INTRAMUSCULAR | Status: DC | PRN
  Administered 2016-11-02: 2 mL via INTRADERMAL

## 2016-11-02 MED ORDER — HEPARIN (PORCINE) IN NACL 100-0.45 UNIT/ML-% IJ SOLN
1300.0000 [IU]/h | INTRAMUSCULAR | Status: DC
  Administered 2016-11-02: 950 [IU]/h via INTRAVENOUS
  Administered 2016-11-03 – 2016-11-08 (×5): 1300 [IU]/h via INTRAVENOUS
  Filled 2016-11-02 (×7): qty 250

## 2016-11-02 MED ORDER — IOPAMIDOL (ISOVUE-370) INJECTION 76%
INTRAVENOUS | Status: AC
  Filled 2016-11-02: qty 100

## 2016-11-02 MED ORDER — HEPARIN (PORCINE) IN NACL 100-0.45 UNIT/ML-% IJ SOLN: 950.0000 [IU]/h | INTRAMUSCULAR | Status: DC

## 2016-11-02 SURGICAL SUPPLY — 10 items
CATH IMPULSE 5F ANG/FL3.5 (CATHETERS) ×2 IMPLANT
DEVICE RAD COMP TR BAND LRG (VASCULAR PRODUCTS) ×2 IMPLANT
GLIDESHEATH SLEND SS 6F .021 (SHEATH) ×2 IMPLANT
GUIDEWIRE INQWIRE 1.5J.035X260 (WIRE) ×1 IMPLANT
HOVERMATT SINGLE USE (MISCELLANEOUS) ×2 IMPLANT
INQWIRE 1.5J .035X260CM (WIRE) ×2
KIT HEART LEFT (KITS) ×2 IMPLANT
PACK CARDIAC CATHETERIZATION (CUSTOM PROCEDURE TRAY) ×2 IMPLANT
TRANSDUCER W/STOPCOCK (MISCELLANEOUS) ×2 IMPLANT
TUBING CIL FLEX 10 FLL-RA (TUBING) ×2 IMPLANT

## 2016-11-02 NOTE — Progress Notes (Addendum)
ANTICOAGULATION CONSULT NOTE  Pharmacy Consult for heparin Indication: chest pain/ACS  No Known Allergies  Patient Measurements: Height: 5\' 8"  (172.7 cm) Weight: 273 lb 13 oz (124.2 kg) IBW/kg (Calculated) : 68.4 Heparin Dosing Weight: 95kg  Vital Signs: Temp: 98 F (36.7 C) (01/03 0745) Temp Source: Oral (01/03 0745) BP: 156/72 (01/03 0911) Pulse Rate: 59 (01/03 0945)  Labs:  Recent Labs  10/31/16 0218 10/31/16 1304 10/31/16 1745 10/31/16 2324 11/01/16 0309 11/02/16 0613  HGB 11.0*  --   --   --  11.0* 11.9*  HCT 32.9*  --   --   --  32.6* 35.6*  PLT 211  --   --   --  212 243  HEPARINUNFRC 0.75* 0.75*  --  0.62 0.53 0.26*  CREATININE 1.64*  --   --   --  1.61* 1.32*  TROPONINI  --  <0.03 <0.03 <0.03  --   --     Estimated Creatinine Clearance: 63 mL/min (by C-G formula based on SCr of 1.32 mg/dL (H)).  Assessment: 76 yo M with h/o CAD presented with chest pain, s/p cath today. Heparin was held, to be re-started 4 hours post TR band removal. CBC stable, no bleeding noted.  Goal of Therapy:  Heparin level 0.3-0.7 units/ml Monitor platelets by anticoagulation protocol: Yes    Plan:  Re-start heparin 950 units/hr at 1600 Check heparin level at 0000 Daily heparin level, CBC Monitor for s/sx of bleeding   Gwenlyn Perking, PharmD PGY1 Pharmacy Resident Pager: [phone] 11/02/2016 10:02 AM

## 2016-11-02 NOTE — Progress Notes (Signed)
Pt was placed in his chair for a little while when he called out to RN stating that he was having some chest pain. RN assessed pt with charge RN and took vitals. 1 PRN nitro was given along with some tylenol. Patient stated that his chest pain started at about a 4/10, then 3/10 and completely gone after the meds were given. B/p pressure has been rechecked and pt has been placed back in bed. Will continue to monitor

## 2016-11-02 NOTE — Progress Notes (Signed)
Transfer order to telemetry dated 12/31 noted on the summary but nothing found in the manage order. Discussed with NP who claimed not to transfer pt. and to come to see him tom.

## 2016-11-02 NOTE — Progress Notes (Signed)
CARDIAC REHAB PHASE I   Pt awaiting surgery consult for CABG. Pt with left main disease, intermittent chest discomfort per RN. MD to advise if pt appropriate for ambulation at this time. Will continue to follow.   Lenna Sciara, RN, BSN 11/02/2016 2:59 PM

## 2016-11-02 NOTE — Progress Notes (Signed)
Pt continuously asked staff for something to eat because he stated that he was starving from earlier. RN ordered pt a [place] sandwich with water to drink. Charge RN notified with no issue with the matter. Will continue to monitor

## 2016-11-02 NOTE — H&P (View-Only) (Signed)
Patient Name: Edward [name] Date of Encounter: 11/02/2016  Primary Cardiologist: HWB Blenda Bridegroom, M.D.  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 Discomfort resolved with NTG.  Inpatient Medications    Scheduled Meds: . amLODipine  10 mg Oral Daily  . aspirin EC  81 mg Oral Daily  . atorvastatin  80 mg Oral q1800  . clopidogrel  75 mg Oral Daily  . dicyclomine  10 mg Oral TID AC  . fenofibrate  160 mg Oral Daily  . fluticasone furoate-vilanterol  1 puff Inhalation Daily  . gabapentin  300 mg Oral BID  . insulin aspart  0-20 Units Subcutaneous TID WC  . insulin aspart protamine- aspart  60 Units Subcutaneous BID WC  . isosorbide mononitrate  90 mg Oral Daily  . pantoprazole  40 mg Oral BID  . propranolol  40 mg Oral TID  . ranolazine  1,000 mg Oral BID  . sodium chloride flush  3 mL Intravenous Q12H  . sodium chloride flush  3 mL Intravenous Q12H   Continuous Infusions: . sodium chloride 75 mL/hr at 11/01/16 2247  . heparin 950 Units/hr (11/01/16 2249)   PRN Meds: sodium chloride, sodium chloride, acetaminophen, albuterol, clonazePAM, magnesium hydroxide, meclizine, nitroGLYCERIN, ondansetron (ZOFRAN) IV, polyvinyl alcohol, sodium chloride flush, sodium chloride flush   Vital Signs    Vitals:   11/01/16 2324 11/02/16 0000 11/02/16 0400 11/02/16 0500  BP:  (!) 132/55 135/70   Pulse:      Resp:      Temp: 98.1 F (36.7 C)     TempSrc: Oral     SpO2:      Weight:    273 lb 13 oz (124.2 kg)  Height:        Intake/Output Summary (Last 24 hours) at 11/02/16 0741 Last data filed at 11/02/16 0700  Gross per 24 hour  Intake             2044 ml  Output             1450 ml  Net              594 ml   Filed Weights   10/31/16 0432 11/01/16 0623 11/02/16 0500  Weight: 266 lb 5.1 oz (120.8 kg) 276 lb 0.3 oz (125.2 kg) 273 lb 13 oz (124.2 kg)    Physical Exam   Obese. In no acute distress. GEN: Well nourished, well developed, in no acute distress.  HEENT: Grossly normal.  Neck: Supple, no JVD, carotid bruits, or masses. Cardiac: RRR. There is a 2/6 systolic murmur at the right upper sternal border. No rub or gallop. No clubbing, cyanosis, edema.  Radials/DP/PT 2+ and equal bilaterally.  Respiratory:  Respirations regular and unlabored, clear to auscultation bilaterally. GI: Soft, nontender, nondistended, BS + x 4. MS: no deformity or atrophy. Skin: warm and dry, no rash. Neuro:  Strength and sensation are intact. Psych: AAOx3.  Normal affect.  Labs    CBC  Recent Labs  11/01/16 0309 11/02/16 0613  WBC 7.4 8.3  HGB 11.0* 11.9*  HCT 32.6* 35.6*  MCV  84.5 85.2  PLT 212 0000000   Basic Metabolic Panel  Recent Labs  11/01/16 0309 11/02/16 0613  NA 135 141  K 4.3 4.5  CL 102 107  CO2 27 26  GLUCOSE 223* 98  BUN 23* 15  CREATININE 1.61* 1.32*  CALCIUM 8.5* 8.6*   Liver Function Tests No results for input(s): AST, ALT, ALKPHOS, BILITOT, PROT, ALBUMIN in the last 72 hours. No results for input(s): LIPASE, AMYLASE in the last 72 hours. Cardiac Enzymes  Recent Labs  10/31/16 1304 10/31/16 1745 10/31/16 2324  TROPONINI <0.03 <0.03 <0.03   BNP Invalid input(s): POCBNP D-Dimer No results for input(s): DDIMER in the last 72 hours. Hemoglobin A1C No results for input(s): HGBA1C in the last 72 hours. Fasting Lipid Panel No results for input(s): CHOL, HDL, LDLCALC, TRIG, CHOLHDL, LDLDIRECT in the last 72 hours. Thyroid Function Tests No results for input(s): TSH, T4TOTAL, T3FREE, THYROIDAB in the last 72 hours.  Invalid input(s): FREET3  Telemetry    Normal  sinus rhythm with occasional PVCs. - Personally Reviewed  ECG    Normal sinus rhythm with first-degree AV block. QS pattern V1 through V4. - Personally Reviewed  Radiology    No results found.  Cardiac Studies   No new data  Patient Profile     76 year old gentleman with type 2 diabetes, hyperlipidemia, coronary artery disease with prior LAD stent and high-grade ostial circumflex with moderate distal left main (40-50%) who presents on this occasion with accelerating episodes of chest discomfort felt to represent angina. No ischemic EKG changes or cardiac marker evidence of myocardial injury. The plan is for repeat coronary angiography to rule out progression of distal left main and circumflex disease. Previously seen and family were 2017 by Dr. Modesto Charon and the decision made at that time to treat medically and if therapy becomes inadequate to control symptoms, consider high risk PCI versus coronary grafting  Assessment & Plan    1. Native coronary artery disease with known 40-50% distal left main and 90+ percent stenosis in the ostial circumflex. Patent LAD stents. Presents now with progressive chest pain syndrome and no evidence of ischemia on EKG or by cardiac markers.If left main progression decision for surgery will be easy.  2. Chest pain, probably ischemic. Plan coronary angiography today.  3. 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 Improved back to baseline this AM prior to cath.  4. Type 2 diabetes, with moderate glycemic control  Signed, Sinclair Grooms, MD  11/02/2016, 7:41 AM

## 2016-11-02 NOTE — Progress Notes (Addendum)
   The digital images obtained earlier today by coronary angiography were reviewed. Also discussed with Dr. Roxan Hockey.  Has been on Effient up until admission. Effient was discontinued on 10/29/16 and Plavix was started. Plavix was not resumed after cath today.  Plan is to consider coronary bypass grafting to the LAD and circumflex due to progression of left main disease.

## 2016-11-02 NOTE — Interval H&P Note (Signed)
History and Physical Interval Note:  11/02/2016 7:53 AM  Edward [name]  has presented today for cardiac catheterization, with the diagnosis of unstable angina.  The various methods of treatment have been discussed with the patient and family. After consideration of risks, benefits and other options for treatment, the patient has consented to  Procedure(s): Left Heart Cath and Coronary Angiography (N/A) as a surgical intervention .  The patient's history has been reviewed, patient examined, no change in status, stable for surgery.  I have reviewed the patient's chart and labs.  Questions were answered to the patient's satisfaction.    Cath Lab Visit (complete for each Cath Lab visit)  Clinical Evaluation Leading to the Procedure:   ACS: Yes.   (unstable angina)  Non-ACS:  N/A  [name]

## 2016-11-02 NOTE — Progress Notes (Signed)
Patient Name: Edward [name] Date of Encounter: 11/02/2016  Primary Cardiologist: [facility] Blenda Bridegroom, M.D.  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 Discomfort resolved with NTG.  Inpatient Medications    Scheduled Meds: . amLODipine  10 mg Oral Daily  . aspirin EC  81 mg Oral Daily  . atorvastatin  80 mg Oral q1800  . clopidogrel  75 mg Oral Daily  . dicyclomine  10 mg Oral TID AC  . fenofibrate  160 mg Oral Daily  . fluticasone furoate-vilanterol  1 puff Inhalation Daily  . gabapentin  300 mg Oral BID  . insulin aspart  0-20 Units Subcutaneous TID WC  . insulin aspart protamine- aspart  60 Units Subcutaneous BID WC  . isosorbide mononitrate  90 mg Oral Daily  . pantoprazole  40 mg Oral BID  . propranolol  40 mg Oral TID  . ranolazine  1,000 mg Oral BID  . sodium chloride flush  3 mL Intravenous Q12H  . sodium chloride flush  3 mL Intravenous Q12H   Continuous Infusions: . sodium chloride 75 mL/hr at 11/01/16 2247  . heparin 950 Units/hr (11/01/16 2249)   PRN Meds: sodium chloride, sodium chloride, acetaminophen, albuterol, clonazePAM, magnesium hydroxide, meclizine, nitroGLYCERIN, ondansetron (ZOFRAN) IV, polyvinyl alcohol, sodium chloride flush, sodium chloride flush   Vital Signs    Vitals:   11/01/16 2324 11/02/16 0000 11/02/16 0400 11/02/16 0500  BP:  (!) 132/55 135/70   Pulse:      Resp:      Temp: 98.1 F (36.7 C)     TempSrc: Oral     SpO2:      Weight:    273 lb 13 oz (124.2 kg)  Height:        Intake/Output Summary (Last 24 hours) at 11/02/16 0741 Last data filed at 11/02/16 0700  Gross per 24 hour  Intake             2044 ml  Output             1450 ml  Net              594 ml   Filed Weights   10/31/16 0432 11/01/16 0623 11/02/16 0500  Weight: 266 lb 5.1 oz (120.8 kg) 276 lb 0.3 oz (125.2 kg) 273 lb 13 oz (124.2 kg)    Physical Exam   Obese. In no acute distress. GEN: Well nourished, well developed, in no acute distress.  HEENT: Grossly normal.  Neck: Supple, no JVD, carotid bruits, or masses. Cardiac: RRR. There is a 2/6 systolic murmur at the right upper sternal border. No rub or gallop. No clubbing, cyanosis, edema.  Radials/DP/PT 2+ and equal bilaterally.  Respiratory:  Respirations regular and unlabored, clear to auscultation bilaterally. GI: Soft, nontender, nondistended, BS + x 4. MS: no deformity or atrophy. Skin: warm and dry, no rash. Neuro:  Strength and sensation are intact. Psych: AAOx3.  Normal affect.  Labs    CBC  Recent Labs  11/01/16 0309 11/02/16 0613  WBC 7.4 8.3  HGB 11.0* 11.9*  HCT 32.6* 35.6*  MCV  84.5 85.2  PLT 212 0000000   Basic Metabolic Panel  Recent Labs  11/01/16 0309 11/02/16 0613  NA 135 141  K 4.3 4.5  CL 102 107  CO2 27 26  GLUCOSE 223* 98  BUN 23* 15  CREATININE 1.61* 1.32*  CALCIUM 8.5* 8.6*   Liver Function Tests No results for input(s): AST, ALT, ALKPHOS, BILITOT, PROT, ALBUMIN in the last 72 hours. No results for input(s): LIPASE, AMYLASE in the last 72 hours. Cardiac Enzymes  Recent Labs  10/31/16 1304 10/31/16 1745 10/31/16 2324  TROPONINI <0.03 <0.03 <0.03   BNP Invalid input(s): POCBNP D-Dimer No results for input(s): DDIMER in the last 72 hours. Hemoglobin A1C No results for input(s): HGBA1C in the last 72 hours. Fasting Lipid Panel No results for input(s): CHOL, HDL, LDLCALC, TRIG, CHOLHDL, LDLDIRECT in the last 72 hours. Thyroid Function Tests No results for input(s): TSH, T4TOTAL, T3FREE, THYROIDAB in the last 72 hours.  Invalid input(s): FREET3  Telemetry    Normal  sinus rhythm with occasional PVCs. - Personally Reviewed  ECG    Normal sinus rhythm with first-degree AV block. QS pattern V1 through V4. - Personally Reviewed  Radiology    No results found.  Cardiac Studies   No new data  Patient Profile     76 year old gentleman with type 2 diabetes, hyperlipidemia, coronary artery disease with prior LAD stent and high-grade ostial circumflex with moderate distal left main (40-50%) who presents on this occasion with accelerating episodes of chest discomfort felt to represent angina. No ischemic EKG changes or cardiac marker evidence of myocardial injury. The plan is for repeat coronary angiography to rule out progression of distal left main and circumflex disease. Previously seen and family were 2017 by Dr. Modesto Charon and the decision made at that time to treat medically and if therapy becomes inadequate to control symptoms, consider high risk PCI versus coronary grafting  Assessment & Plan    1. Native coronary artery disease with known 40-50% distal left main and 90+ percent stenosis in the ostial circumflex. Patent LAD stents. Presents now with progressive chest pain syndrome and no evidence of ischemia on EKG or by cardiac markers.If left main progression decision for surgery will be easy.  2. Chest pain, probably ischemic. Plan coronary angiography today.  3. 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 Improved back to baseline this AM prior to cath.  4. Type 2 diabetes, with moderate glycemic control  Signed, Sinclair Grooms, MD  11/02/2016, 7:41 AM

## 2016-11-02 NOTE — Progress Notes (Signed)
  Echocardiogram 2D Echocardiogram has been performed.  Taylee Gunnells L Androw 11/02/2016, 1:02 PM

## 2016-11-02 NOTE — Progress Notes (Signed)
TR band removed, 2x2 gauze and tegaderm dressing applied. No bleeding noted. Continue to monitor.

## 2016-11-02 NOTE — Progress Notes (Signed)
Back from  The  cath. Lab awake and alert. TR band to right wrist in placed,elevated with pillow, pulse probe to right thumb. Instructed to avoid moving the site. Continue to monitor.

## 2016-11-03 ENCOUNTER — Inpatient Hospital Stay (HOSPITAL_COMMUNITY): Payer: Medicare Other

## 2016-11-03 DIAGNOSIS — Z01810 Encounter for preprocedural cardiovascular examination: Secondary | ICD-10-CM

## 2016-11-03 DIAGNOSIS — I25110 Atherosclerotic heart disease of native coronary artery with unstable angina pectoris: Secondary | ICD-10-CM

## 2016-11-03 DIAGNOSIS — N183 Chronic kidney disease, stage 3 unspecified: Secondary | ICD-10-CM

## 2016-11-03 LAB — BASIC METABOLIC PANEL
ANION GAP: 5 (ref 5–15)
BUN: 14 mg/dL (ref 6–20)
CHLORIDE: 105 mmol/L (ref 101–111)
CO2: 29 mmol/L (ref 22–32)
Calcium: 8.8 mg/dL — ABNORMAL LOW (ref 8.9–10.3)
Creatinine, Ser: 1.38 mg/dL — ABNORMAL HIGH (ref 0.61–1.24)
GFR calc non Af Amer: 49 mL/min — ABNORMAL LOW (ref >60)
GFR, EST AFRICAN AMERICAN: 57 mL/min — AB (ref >60)
GLUCOSE: 88 mg/dL (ref 65–99)
Potassium: 3.9 mmol/L (ref 3.5–5.1)
Sodium: 139 mmol/L (ref 135–145)

## 2016-11-03 LAB — CBC
HCT: 33.9 % — ABNORMAL LOW (ref 39.0–52.0)
Hemoglobin: 11.3 g/dL — ABNORMAL LOW (ref 13.0–17.0)
MCH: 28.3 pg (ref 26.0–34.0)
MCHC: 33.3 g/dL (ref 30.0–36.0)
MCV: 85 fL (ref 78.0–100.0)
Platelets: 265 10*3/uL (ref 150–400)
RBC: 3.99 MIL/uL — AB (ref 4.22–5.81)
RDW: 13.2 % (ref 11.5–15.5)
WBC: 7.5 10*3/uL (ref 4.0–10.5)

## 2016-11-03 LAB — VAS US DOPPLER PRE CABG
LCCADDIAS: -19 cm/s
LCCAPSYS: 121 cm/s
LEFT ECA DIAS: -15 cm/s
LEFT VERTEBRAL DIAS: 13 cm/s
LICADDIAS: -24 cm/s
LICADSYS: -82 cm/s
Left CCA dist sys: -198 cm/s
Left CCA prox dias: 13 cm/s
Left ICA prox dias: -20 cm/s
Left ICA prox sys: -97 cm/s
RCCADSYS: -76 cm/s
RIGHT ECA DIAS: -4 cm/s
RIGHT VERTEBRAL DIAS: 12 cm/s
Right CCA prox dias: 7 cm/s
Right CCA prox sys: 109 cm/s

## 2016-11-03 LAB — GLUCOSE, CAPILLARY
GLUCOSE-CAPILLARY: 200 mg/dL — AB (ref 65–99)
GLUCOSE-CAPILLARY: 237 mg/dL — AB (ref 65–99)
Glucose-Capillary: 120 mg/dL — ABNORMAL HIGH (ref 65–99)
Glucose-Capillary: 140 mg/dL — ABNORMAL HIGH (ref 65–99)
Glucose-Capillary: 96 mg/dL (ref 65–99)

## 2016-11-03 LAB — HEPARIN LEVEL (UNFRACTIONATED)
HEPARIN UNFRACTIONATED: 0.5 [IU]/mL (ref 0.30–0.70)
Heparin Unfractionated: 0.29 IU/mL — ABNORMAL LOW (ref 0.30–0.70)

## 2016-11-03 MED ORDER — POTASSIUM CHLORIDE CRYS ER 10 MEQ PO TBCR
10.0000 meq | EXTENDED_RELEASE_TABLET | Freq: Two times a day (BID) | ORAL | Status: DC
  Administered 2016-11-03 – 2016-11-08 (×12): 10 meq via ORAL
  Filled 2016-11-03 (×12): qty 1

## 2016-11-03 MED ORDER — FUROSEMIDE 40 MG PO TABS
40.0000 mg | ORAL_TABLET | Freq: Every day | ORAL | Status: DC
  Administered 2016-11-03 – 2016-11-04 (×2): 40 mg via ORAL
  Filled 2016-11-03 (×3): qty 1

## 2016-11-03 NOTE — Progress Notes (Signed)
[id number] Gave pt [facility] booklet and care guide. Pt stated he has already watched pre op video. Wrote down how to view in case he wants to watch again or family wants to watch. Discussed sternal precautions, IS, and demonstrated to pt how to get up and down without use of arms. Pt stated he does not have anyone to stay with him after surgery so he will need to see case manager and social worker after surgery. Cardiology please advise if pt is to walk with [place] prior to surgery as he has LM disease and he had an episode of CP early this morning. Will continue to follow. Pt does not have IS yet but he stated he knows how to use from previous surgery. Graylon Good RN BSN 11/03/2016 12:07 PM

## 2016-11-03 NOTE — Consult Note (Signed)
Reason for Consult:Left main disease Referring Physician: Dr. Julien Nordmann is an 76 y.o. male.  HPI: 76 yo man who presents with a c/o CP  Edward [name] is a 76 yo man with a history of known single vessel CAD with an ostial circumflex stenosis treated medically. 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 He also has a history of GERD, sleep apnea and a remote TIA. HE presents with accelerating CP with exertion.   Cardiac catheterization 1/3 showed left main stenosis and ostial LCX stenosis.   Past Medical History:  Diagnosis Date  . Adenomatous colon polyp 04/2011  . CAD (coronary artery disease)    a. 01/2015 DES to LAD  b. 12/2015: [place] 85% oLCx lesion--> rx therapy.  . Chronic diastolic CHF (congestive heart failure) ([place])   . CKD (chronic kidney disease), stage II    a. probable CKD II-III with baseline CR 1.1-1.3.  . DM type 2 (diabetes mellitus, type 2), insulin dependent 01/29/2014   fasting cbg 50-120 with new regimen  . Dyslipidemia, goal LDL below 70 01/29/2014  . Enlarged prostate   . Essential tremor    a. on proprnolol  . GERD (gastroesophageal reflux disease)   . Hypertension   . Internal hemorrhoid   . Sleep apnea    does not use cpap (06/04/2015)  . TIA (transient ischemic attack) 2002    Past Surgical History:  Procedure Laterality Date  . BACK SURGERY    . CARDIAC CATHETERIZATION  01/28/14   + CAD treat medically  . CARDIAC CATHETERIZATION N/A 12/21/2015   Procedure: Right/Left Heart Cath and Coronary Angiography;  Surgeon: Belva Crome, MD;  Location: [place] [facility];  Service: Cardiovascular;  Laterality: N/A;  . CARDIAC CATHETERIZATION N/A 11/02/2016   Procedure: Left Heart Cath and Coronary Angiography;  Surgeon: Nelva Bush, MD;  Location: [place] [facility];  Service: Cardiovascular;  Laterality: N/A;  . CATARACT EXTRACTION Bilateral   . CATARACT EXTRACTION,  BILATERAL Bilateral   . CORONARY ANGIOPLASTY WITH STENT PLACEMENT  01/30/2015   DES [facility]  [place] to LAD by Dr Tamala Julian  . JOINT REPLACEMENT    . LEFT HEART CATHETERIZATION WITH CORONARY ANGIOGRAM N/A 01/28/2014   Procedure: LEFT HEART CATHETERIZATION WITH CORONARY ANGIOGRAM;  Surgeon: Sinclair Grooms, MD;  Location: [facility] CATH LAB;  Service: Cardiovascular;  Laterality: N/A;  . LEFT HEART CATHETERIZATION WITH CORONARY ANGIOGRAM N/A 01/30/2015   Procedure: LEFT HEART CATHETERIZATION WITH CORONARY ANGIOGRAM;  Surgeon: Belva Crome, MD;  Location: [facility] CATH LAB;  Service: Cardiovascular;  Laterality: N/A;  . LUMBAR [place]    . SHOULDER ARTHROSCOPY Right 07/30/2014   Procedure: Right Shoulder Arthroscopy, Debridement, Decompression, Manipulation Under Anesthesia;  Surgeon: Newt Minion, MD;  Location: [place];  Service: Orthopedics;  Laterality: Right;  . TOTAL KNEE ARTHROPLASTY Bilateral     Family History  Problem Relation Age of Onset  . CAD Brother     2 brothers - CABG  . Alzheimer's disease Sister   . CAD Sister   . Prostate cancer Brother   . Asthma Brother     2 brothers   . Diabetes Other     entire family    Social History:  reports that he has never smoked. He has never used smokeless tobacco. He reports that he does not drink alcohol or use drugs.  Allergies: No Known Allergies  Medications:  Scheduled: . amLODipine  10 mg Oral Daily  . aspirin EC  81 mg Oral Daily  . atorvastatin  80 mg Oral q1800  . dicyclomine  10 mg Oral TID AC  . fenofibrate  160 mg Oral Daily  . fluticasone furoate-vilanterol  1 puff Inhalation Daily  . gabapentin  300 mg Oral BID  . insulin aspart  0-20 Units Subcutaneous TID WC  . insulin aspart protamine- aspart  60 Units Subcutaneous BID WC  . isosorbide mononitrate  90 mg Oral Daily  . pantoprazole  40 mg Oral BID  . propranolol  40 mg Oral TID  . ranolazine  1,000 mg Oral BID  . sodium chloride flush  3 mL Intravenous Q12H  . sodium  chloride flush  3 mL Intravenous Q12H    Results for orders placed or performed during the hospital encounter of 10/29/16 (from the past 48 hour(s))  Glucose, capillary     Status: Abnormal   Collection Time: 11/01/16 11:20 AM  Result Value Ref Range   Glucose-Capillary 281 (H) 65 - 99 mg/dL  Glucose, capillary     Status: Abnormal   Collection Time: 11/01/16  3:52 PM  Result Value Ref Range   Glucose-Capillary 232 (H) 65 - 99 mg/dL  Heparin level (unfractionated)     Status: Abnormal   Collection Time: 11/02/16  6:13 AM  Result Value Ref Range   Heparin Unfractionated 0.26 (L) 0.30 - 0.70 IU/mL    Comment:        IF HEPARIN RESULTS ARE BELOW EXPECTED VALUES, AND PATIENT DOSAGE HAS BEEN CONFIRMED, SUGGEST FOLLOW UP TESTING OF ANTITHROMBIN III LEVELS.   CBC     Status: Abnormal   Collection Time: 11/02/16  6:13 AM  Result Value Ref Range   WBC 8.3 4.0 - 10.5 K/uL   RBC 4.18 (L) 4.22 - 5.81 MIL/uL   Hemoglobin 11.9 (L) 13.0 - 17.0 g/dL   HCT 35.6 (L) 39.0 - 52.0 %   MCV 85.2 78.0 - 100.0 fL   MCH 28.5 26.0 - 34.0 pg   MCHC 33.4 30.0 - 36.0 g/dL   RDW 13.3 11.5 - 15.5 %   Platelets 243 150 - 400 K/uL  Basic metabolic panel     Status: Abnormal   Collection Time: 11/02/16  6:13 AM  Result Value Ref Range   Sodium 141 135 - 145 mmol/L   Potassium 4.5 3.5 - 5.1 mmol/L   Chloride 107 101 - 111 mmol/L   CO2 26 22 - 32 mmol/L   Glucose, Bld 98 65 - 99 mg/dL   BUN 15 6 - 20 mg/dL   Creatinine, Ser 1.32 (H) 0.61 - 1.24 mg/dL   Calcium 8.6 (L) 8.9 - 10.3 mg/dL   GFR calc non Af Amer 51 (L) >60 mL/min   GFR calc Af Amer 60 (L) >60 mL/min    Comment: (NOTE) The eGFR has been calculated using the CKD EPI equation. This calculation has not been validated in all clinical situations. eGFR's persistently <60 mL/min signify possible Chronic Kidney Disease.    Anion gap 8 5 - 15  Glucose, capillary     Status: Abnormal   Collection Time: 11/02/16  7:37 AM  Result Value Ref Range    Glucose-Capillary 111 (H) 65 - 99 mg/dL  Glucose, capillary     Status: Abnormal   Collection Time: 11/02/16 12:28 PM  Result Value Ref Range   Glucose-Capillary 257 (H) 65 - 99 mg/dL  Glucose, capillary       Status: Abnormal   Collection Time: 11/02/16  4:41 PM  Result Value Ref Range   Glucose-Capillary 305 (H) 65 - 99 mg/dL  Glucose, capillary     Status: Abnormal   Collection Time: 11/02/16 10:12 PM  Result Value Ref Range   Glucose-Capillary 174 (H) 65 - 99 mg/dL  Heparin level (unfractionated)     Status: Abnormal   Collection Time: 11/02/16 11:33 PM  Result Value Ref Range   Heparin Unfractionated 0.15 (L) 0.30 - 0.70 IU/mL    Comment:        IF HEPARIN RESULTS ARE BELOW EXPECTED VALUES, AND PATIENT DOSAGE HAS BEEN CONFIRMED, SUGGEST FOLLOW UP TESTING OF ANTITHROMBIN III LEVELS.   Basic metabolic panel     Status: Abnormal   Collection Time: 11/03/16  2:57 AM  Result Value Ref Range   Sodium 139 135 - 145 mmol/L   Potassium 3.9 3.5 - 5.1 mmol/L   Chloride 105 101 - 111 mmol/L   CO2 29 22 - 32 mmol/L   Glucose, Bld 88 65 - 99 mg/dL   BUN 14 6 - 20 mg/dL   Creatinine, Ser 1.38 (H) 0.61 - 1.24 mg/dL   Calcium 8.8 (L) 8.9 - 10.3 mg/dL   GFR calc non Af Amer 49 (L) >60 mL/min   GFR calc Af Amer 57 (L) >60 mL/min    Comment: (NOTE) The eGFR has been calculated using the CKD EPI equation. This calculation has not been validated in all clinical situations. eGFR's persistently <60 mL/min signify possible Chronic Kidney Disease.    Anion gap 5 5 - 15  CBC     Status: Abnormal   Collection Time: 11/03/16  2:57 AM  Result Value Ref Range   WBC 7.5 4.0 - 10.5 K/uL   RBC 3.99 (L) 4.22 - 5.81 MIL/uL   Hemoglobin 11.3 (L) 13.0 - 17.0 g/dL   HCT 33.9 (L) 39.0 - 52.0 %   MCV 85.0 78.0 - 100.0 fL   MCH 28.3 26.0 - 34.0 pg   MCHC 33.3 30.0 - 36.0 g/dL   RDW 13.2 11.5 - 15.5 %   Platelets 265 150 - 400 K/uL  Glucose, capillary     Status: Abnormal   Collection Time:  11/03/16  7:36 AM  Result Value Ref Range   Glucose-Capillary 120 (H) 65 - 99 mg/dL    No results found.  Review of Systems  Constitutional: Positive for malaise/fatigue. Negative for chills and fever.  Respiratory: Positive for shortness of breath.        Sleep apnea  Cardiovascular: Positive for chest pain and leg swelling.  Genitourinary: Positive for frequency and urgency.  Neurological: Negative for speech change, focal weakness and loss of consciousness.   Blood pressure (!) 134/55, pulse (!) 59, temperature 98.2 F (36.8 C), temperature source Oral, resp. rate 16, height 5' 8" (1.727 m), weight 274 lb 8 oz (124.5 kg), SpO2 100 %. Physical Exam  Vitals reviewed. Constitutional: No distress.  obese  HENT:  Head: Normocephalic and atraumatic.  Mouth/Throat: No oropharyngeal exudate.  Eyes: Conjunctivae and EOM are normal. No scleral icterus.  Neck: Neck supple. No thyromegaly present.  No carotid bruits  Cardiovascular: Normal rate, regular rhythm and normal heart sounds.   No murmur heard. Respiratory: Effort normal and breath sounds normal. No respiratory distress. He has no wheezes. He has no rales.  Musculoskeletal: He exhibits edema (2+ bilateral).  Lymphadenopathy:    He has no cervical adenopathy.  Skin: He is not diaphoretic.       CARDIAC CATHETERIZATION Conclusion   Conclusions: 1. Significant LMCA (60%) and ostial LCx (85%) stenoses.  Eccentric mid LMCA stenosis appears somewhat worse than prior angiogram in 12/2015.  Lesion was borderline significant at that time by IVUS. 2. Patent mid LAD stent with mild to moderate proximal LAD, LCx/OM, and RCA disease. 3. Mildly elevated left ventricular filling pressure.   Recommendations: 1. Cardiac surgery consultation for CABG, given progression of LMCA disease. 2. Discontinue clopidogrel in anticipation of possible CABG. 3. Restart heparin infusion 4 hours after TR band removal. 4. Continue aggressive secondary  prevention.  Findings and recommendations were discussed with Dr. Smith.  Christopher End, MD [facility] Pager: [phone]   I personally reviewed the cath images with Dr. Smith and concur with the findings noted above  Assessment/Plan: 75 yo man with multiple CRf and known CAD treated medically who presents with an unstable coronary syndrome. Catheterization showed progression of disease and now has significant left main stenosis. CABG indicated for survival benefit and relief of symptoms.  I have discussed the general nature of the procedure, the need for general anesthesia, the use of cardiopulmonary bypass, and the incisions to be used with Mr. Delk. We discussed the expected hospital stay, overall recovery and short and long term outcomes. I informed him of the indications, risks, benefits and alternatives. He understands the risks include, but are not limited to death, stroke, MI, DVT/PE, bleeding, possible need for transfusion, infections,other organ system dysfunction including respiratory, renal, or GI complications. He accepts the risks and agrees to proceed.  For CABG next week after plavix washout. Date to be determined  Izick Gasbarro C Kenzel Ruesch 11/03/2016, 8:38 AM     

## 2016-11-03 NOTE — Progress Notes (Signed)
ANTICOAGULATION CONSULT NOTE - Follow Up Consult  Pharmacy Consult for Heparin  Indication: chest pain/ACS, s/p cath, plan to consider CABG  No Known Allergies  Patient Measurements: Height: 5\' 8"  (172.7 cm) Weight: 273 lb 13 oz (124.2 kg) IBW/kg (Calculated) : 68.4  Vital Signs: Temp: 98.4 F (36.9 C) (01/03 2300) Temp Source: Oral (01/03 2300) BP: 119/53 (01/03 2300) Pulse Rate: 65 (01/03 1630)  Labs:  Recent Labs  10/31/16 0218 10/31/16 1304 10/31/16 1745 10/31/16 2324 11/01/16 0309 11/02/16 0613 11/02/16 2333  HGB 11.0*  --   --   --  11.0* 11.9*  --   HCT 32.9*  --   --   --  32.6* 35.6*  --   PLT 211  --   --   --  212 243  --   HEPARINUNFRC 0.75* 0.75*  --  0.62 0.53 0.26* 0.15*  CREATININE 1.64*  --   --   --  1.61* 1.32*  --   TROPONINI  --  <0.03 <0.03 <0.03  --   --   --     Estimated Creatinine Clearance: 63 mL/min (by C-G formula based on SCr of 1.32 mg/dL (H)).   Assessment: On heparin s/p cath with plans for TCTS consult for possible CABG, heparin level low this AM, no issues per RN.   Goal of Therapy:  Heparin level 0.3-0.7 units/ml Monitor platelets by anticoagulation protocol: Yes   Plan:  -Inc heparin to 1100 units/hr -0900 HL  Narda Bonds 11/03/2016,12:14 AM

## 2016-11-03 NOTE — Progress Notes (Signed)
Pre-op Cardiac Surgery  Carotid Findings:  Bilateral: No significant (1-39%) ICA stenosis. Antegrade vertebral flow.    Upper Extremity Right Left  Brachial Pressures 130 133  Radial Waveforms Tri Tri  Ulnar Waveforms Tri Tri  Palmar Arch (Allen's Test) Decreases >50% with radial compression, normal with ulnar compression Decreases >50% with radial compression, normal with ulnar compression      Findings:  ABI is within normal limits, but this is most likely falsely elevated, due to monophasic waveforms and abnormal TBI.   Lower  Extremity Right Left      Anterior Tibial 152, mono 138, mono  Posterior Tibial 126, mono 162, mono  Toe/ Brachial Indices (TBI) 0.41 0.49  Ankle/Brachial Indices (ABI) 1.14 1.22       Landry Mellow, RDMS, RVT 11/03/2016

## 2016-11-03 NOTE — Progress Notes (Signed)
ANTICOAGULATION CONSULT NOTE - Follow Up Consult  Pharmacy Consult for heparin Indication: chest pain/ACS  No Known Allergies  Patient Measurements: Height: 5\' 8"  (172.7 cm) Weight: 274 lb 8 oz (124.5 kg) IBW/kg (Calculated) : 68.4 Heparin Dosing Weight: 97 kg  Vital Signs: Temp: 98.2 F (36.8 C) (01/04 0733) Temp Source: Oral (01/04 0733) BP: 134/55 (01/04 0733) Pulse Rate: 59 (01/04 0733)  Labs:  Recent Labs  10/31/16 1304 10/31/16 1745 10/31/16 2324  11/01/16 0309 11/02/16 [id number] 11/02/16 2333 11/03/16 0257 11/03/16 0857  HGB  --   --   --   < > 11.0* 11.9*  --  11.3*  --   HCT  --   --   --   --  32.6* 35.6*  --  33.9*  --   PLT  --   --   --   --  212 243  --  265  --   HEPARINUNFRC 0.75*  --  0.62  --  0.53 0.26* 0.15*  --  0.29*  CREATININE  --   --   --   --  1.61* 1.32*  --  1.38*  --   TROPONINI <0.03 <0.03 <0.03  --   --   --   --   --   --   < > = values in this interval not displayed.  Estimated Creatinine Clearance: 60.3 mL/min (by C-G formula based on SCr of 1.38 mg/dL (H)).    Assessment: 23 YOM with CP. HL from 1/4 @ 0857 is subtherapeutic at 0.29 with infusion rate of 1100 units/hr. H/H 11.9/35.6 to 11.3/33.9.   Goal of Therapy:  Heparin level 0.3-0.7 units/ml Monitor platelets by anticoagulation protocol: Yes   Plan:  Increase heparin gtt to 1300 units/hr 2000 HL Daily HL/CBC Monitor for s/sx bleeding  Cheral Almas, PharmD Candidate 11/03/2016,10:24 AM

## 2016-11-03 NOTE — Progress Notes (Signed)
ANTICOAGULATION CONSULT NOTE - Follow Up Consult  Pharmacy Consult for heparin Indication: chest pain/ACS  No Known Allergies  Patient Measurements: Height: 5\' 8"  (172.7 cm) Weight: 274 lb 8 oz (124.5 kg) IBW/kg (Calculated) : 68.4 Heparin Dosing Weight: 97 kg  Vital Signs: Temp: 97.8 F (36.6 C) (01/04 2017) Temp Source: Oral (01/04 2017) BP: 156/75 (01/04 2017) Pulse Rate: 65 (01/04 2017)  Labs:  Recent Labs  10/31/16 2324  11/01/16 0309 11/02/16 [id number] 11/02/16 2333 11/03/16 0257 11/03/16 0857 11/03/16 2003  HGB  --   < > 11.0* 11.9*  --  11.3*  --   --   HCT  --   --  32.6* 35.6*  --  33.9*  --   --   PLT  --   --  212 243  --  265  --   --   HEPARINUNFRC 0.62  --  0.53 0.26* 0.15*  --  0.29* 0.50  CREATININE  --   --  1.61* 1.32*  --  1.38*  --   --   TROPONINI <0.03  --   --   --   --   --   --   --   < > = values in this interval not displayed.  Estimated Creatinine Clearance: 60.3 mL/min (by C-G formula based on SCr of 1.38 mg/dL (H)).    Assessment: 18 YOM with progressive left main disease awaiting CABG on IV heparin.  Heparin level is therapeutic after increase to 1300 units/hr.  CBC is stable. No bleeding noted.   Goal of Therapy:  Heparin level 0.3-0.7 units/ml Monitor platelets by anticoagulation protocol: Yes   Plan:  Continue heparin gtt to 1300 units/hr Daily HL/CBC Monitor for s/sx bleeding  Sloan Leiter, PharmD, BCPS Clinical Pharmacist [phone] until 11 PM tonight [phone] after hours 11/03/2016,9:18 PM

## 2016-11-03 NOTE — Progress Notes (Signed)
Patient reports pain is 4/10 at this time. He stated that the medication is working. 2 RN's in the room with the patient.

## 2016-11-03 NOTE — Progress Notes (Signed)
Pt states that he is having active chest pain that is radiating to his left shoulder. He stated that the pain is 6/10. B/p noted. Patient has been assessed. PRN tylenol and nitro given per pt request -for both have help a great deal to decrease overall pain. 2 RN's assessed patient.

## 2016-11-03 NOTE — Progress Notes (Signed)
Patient Name: Edward [name] Date of Encounter: 11/03/2016  Primary Cardiologist: [facility] Blenda Bridegroom, M.D.  Hospital Problem List     Active Problems:   Dyslipidemia, goal LDL below 70   HTN (hypertension)   Essential tremor   Unstable angina (HCC)   Atherosclerosis of native coronary artery of native heart with unstable angina pectoris (HCC)   Type 2 diabetes mellitus with circulatory disorder, with long-term current use of insulin ([place])     Subjective   He is having twinges of chest pain. No prolonged episodes. No nitroglycerin use in the past 24 hours.  Inpatient Medications    Scheduled Meds: . amLODipine  10 mg Oral Daily  . aspirin EC  81 mg Oral Daily  . atorvastatin  80 mg Oral q1800  . dicyclomine  10 mg Oral TID AC  . fenofibrate  160 mg Oral Daily  . fluticasone furoate-vilanterol  1 puff Inhalation Daily  . gabapentin  300 mg Oral BID  . insulin aspart  0-20 Units Subcutaneous TID WC  . insulin aspart protamine- aspart  60 Units Subcutaneous BID WC  . isosorbide mononitrate  90 mg Oral Daily  . pantoprazole  40 mg Oral BID  . propranolol  40 mg Oral TID  . ranolazine  1,000 mg Oral BID  . sodium chloride flush  3 mL Intravenous Q12H  . sodium chloride flush  3 mL Intravenous Q12H   Continuous Infusions: . heparin 1,300 Units/hr (11/03/16 1116)   PRN Meds: sodium chloride, sodium chloride, acetaminophen, albuterol, clonazePAM, magnesium hydroxide, meclizine, nitroGLYCERIN, ondansetron (ZOFRAN) IV, polyvinyl alcohol, sodium chloride flush, sodium chloride flush   Vital Signs    Vitals:   11/03/16 0733 11/03/16 1000 11/03/16 1100 11/03/16 1104  BP: (!) 134/55 (!) 146/89 (!) 130/55 (!) 130/55  Pulse: (!) 59   60  Resp:      Temp: 98.2 F (36.8 C)   98 F (36.7 C)  TempSrc: Oral   Oral  SpO2: 100%   96%  Weight:      Height:        Intake/Output Summary (Last 24 hours) at 11/03/16 1130 Last data filed at 11/03/16 1109  Gross per 24 hour  Intake             813.1 ml  Output              690 ml  Net            123.1 ml   Filed Weights   11/01/16 0623 11/02/16 0500 11/03/16 0300  Weight: 276 lb 0.3 oz (125.2 kg) 273 lb 13 oz (124.2 kg) 274 lb 8 oz (124.5 kg)    Physical Exam  Obese. In no acute distress. GEN: Well nourished, well developed, in no acute distress.  HEENT: Grossly normal.  Neck: Supple, no JVD, carotid bruits, or masses. Cardiac: RRR. There is a 2/6 systolic murmur at the right upper sternal border. No rub or gallop. No clubbing, cyanosis, edema.  Radials/DP/PT 2+ and equal bilaterally.  Respiratory:  Respirations regular and unlabored, clear to auscultation bilaterally. GI: Soft, nontender, nondistended, BS + x 4. MS: no deformity or atrophy. Skin: warm and dry, no rash. Neuro:  Strength and sensation are intact. Psych: AAOx3.  Normal affect.  Labs    CBC  Recent Labs  11/02/16 0613 11/03/16 0257  WBC 8.3 7.5  HGB 11.9* 11.3*  HCT 35.6* 33.9*  MCV 85.2 85.0  PLT 243 [id number]   Basic Metabolic  Panel  Recent Labs  11/02/16 0613 11/03/16 0257  NA 141 139  K 4.5 3.9  CL 107 105  CO2 26 29  GLUCOSE 98 88  BUN 15 14  CREATININE 1.32* 1.38*  CALCIUM 8.6* 8.8*   Liver Function Tests No results for input(s): AST, ALT, ALKPHOS, BILITOT, PROT, ALBUMIN in the last 72 hours. No results for input(s): LIPASE, AMYLASE in the last 72 hours. Cardiac Enzymes  Recent Labs  10/31/16 1304 10/31/16 1745 10/31/16 2324  TROPONINI <0.03 <0.03 <0.03     Telemetry    Normal sinus rhythm with occasional PVCs. - Personally Reviewed  ECG    A tracing has not been performed- Personally Reviewed  Radiology    No results found.  Cardiac Studies   Cardiac catheterization 11/02/16: Diagram         Patient Profile     76 year old gentleman with type 2 diabetes, hyperlipidemia, coronary artery disease with prior LAD stent and high-grade ostial circumflex with moderate distal left main (40-50%) who  presents on this occasion with accelerating episodes of chest discomfort felt to represent angina. No ischemic EKG changes or cardiac marker evidence of myocardial injury. The plan is for repeat coronary angiography to rule out progression of distal left main and circumflex disease. Previously seen and family were 2017 by Dr. Modesto Charon and the decision made at that time to treat medically and if therapy becomes inadequate to control symptoms, consider high risk PCI versus coronary grafting. Repeat cardiac catheterization on 11/02/16 as noted above reveals progression of left main disease  Assessment & Plan    1. Native coronary artery disease: Cath revealed progression of left main disease. Digital images were reviewed and are as noted in the above diagram. TCTS, Dr. Roxan Hockey has been reconsult it. Plan to Plavix/Effient therapy. Surgery early next week. Plan transferred to the floor.  2. Chest pain, probably ischemic. Some features of the patient's chest pain syndrome or atypical. With progression of left main disease, bypass surgery with LIMA to LAD and saphenous vein graft to circumflex is reasonable.  3. 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 Kidney function is stable post-cath today with creatinine of 1.38. Plan to resume low dose diuretic therapy.  4. Chronic diastolic heart failure: Resume low-dose diuretic therapy to avoid acute decompensation. Usual diuretic regimen is furosemide 40 mg daily.  Signed, Sinclair Grooms, MD  11/03/2016, 11:30 AM

## 2016-11-04 DIAGNOSIS — N183 Chronic kidney disease, stage 3 (moderate): Secondary | ICD-10-CM

## 2016-11-04 LAB — BASIC METABOLIC PANEL
ANION GAP: 8 (ref 5–15)
BUN: 13 mg/dL (ref 6–20)
CALCIUM: 8.7 mg/dL — AB (ref 8.9–10.3)
CHLORIDE: 103 mmol/L (ref 101–111)
CO2: 26 mmol/L (ref 22–32)
CREATININE: 1.4 mg/dL — AB (ref 0.61–1.24)
GFR calc Af Amer: 56 mL/min — ABNORMAL LOW (ref >60)
GFR calc non Af Amer: 48 mL/min — ABNORMAL LOW (ref >60)
GLUCOSE: 231 mg/dL — AB (ref 65–99)
Potassium: 4.6 mmol/L (ref 3.5–5.1)
Sodium: 137 mmol/L (ref 135–145)

## 2016-11-04 LAB — HEPARIN LEVEL (UNFRACTIONATED): Heparin Unfractionated: 0.68 IU/mL (ref 0.30–0.70)

## 2016-11-04 LAB — CBC
HEMATOCRIT: 34.1 % — AB (ref 39.0–52.0)
Hemoglobin: 11.2 g/dL — ABNORMAL LOW (ref 13.0–17.0)
MCH: 27.8 pg (ref 26.0–34.0)
MCHC: 32.8 g/dL (ref 30.0–36.0)
MCV: 84.6 fL (ref 78.0–100.0)
PLATELETS: 240 10*3/uL (ref 150–400)
RBC: 4.03 MIL/uL — ABNORMAL LOW (ref 4.22–5.81)
RDW: 13.3 % (ref 11.5–15.5)
WBC: 7.6 10*3/uL (ref 4.0–10.5)

## 2016-11-04 LAB — GLUCOSE, CAPILLARY
GLUCOSE-CAPILLARY: 225 mg/dL — AB (ref 65–99)
GLUCOSE-CAPILLARY: 149 mg/dL — AB (ref 65–99)
Glucose-Capillary: 238 mg/dL — ABNORMAL HIGH (ref 65–99)
Glucose-Capillary: 214 mg/dL — ABNORMAL HIGH (ref 65–99)

## 2016-11-04 MED ORDER — NITROGLYCERIN IN D5W 200-5 MCG/ML-% IV SOLN
0.0000 ug/min | INTRAVENOUS | Status: DC
  Administered 2016-11-04: 5 ug/min via INTRAVENOUS
  Administered 2016-11-06: 15 ug/min via INTRAVENOUS
  Filled 2016-11-04 (×2): qty 250

## 2016-11-04 NOTE — Progress Notes (Addendum)
Pt complaining of chest pain. EKG taken. two of nitro given. Vital signs taken. BP 123/50 O2 99%. MD page. Awaiting response. Will continue to monitor pt.

## 2016-11-04 NOTE — Plan of Care (Signed)
Problem: Food- and Nutrition-Related Knowledge Deficit (NB-1.1) Goal: Nutrition education Formal process to instruct or train a patient/client in a skill or to impart knowledge to help patients/clients voluntarily manage or modify food choices and eating behavior to maintain or improve health.  Outcome: Adequate for Discharge  RD consulted for nutrition education regarding diabetes.   Lab Results  Component Value Date   HGBA1C 9.4 09/13/2016   Spoke with pt at bedside, who reports he has had DM for the past 36 years. He sees an endocrinologist locally. He reports that he feels like his blood sugars "are doing better", however, is concerned about hypoglycemic episodes due to food portions being too small. Pt reports home CBGS range within the 200-300 range and last Hgb A1c was in the 8 range a few weeks ago.   Pt shares he has tried to make efforts for a healthy lifestyle due to his upcoming heart surgery. Pt reports he walks daily and has started cooking more. Pt recently relocated to [place] from [place], where he was sedentary and consumed high fat foods daily by eating out. Pt recently implemented meal delivery kits, which he enjoys, and has implemented a routine to assist with overall health. Most of the visit was focused on motivational interviewing and encouraging pt to continue to strive towards better health and DM control.   Pt requesting double portions; amenable to double portions of meat and non-starchy vegetables.   RD provided "Carbohydrate Counting for People with Diabetes" handout from the Academy of [employer]etics. Discussed different food groups and their effects on blood sugar, emphasizing carbohydrate-containing foods. Provided list of carbohydrates and recommended serving sizes of common foods.  Discussed importance of controlled and consistent carbohydrate intake throughout the day. Provided examples of ways to balance meals/snacks and encouraged  intake of high-fiber, whole grain complex carbohydrates. Teach back method used.  Expect fair compliance.  Body mass index is 40.64 kg/m. Pt meets criteria for extreme obesity, class III based on current BMI.  Current diet order is carb modified, patient is consuming approximately 100% of meals at this time. Labs and medications reviewed. No further nutrition interventions warranted at this time. RD contact information provided. If additional nutrition issues arise, please re-consult RD.  Edward [name], RD, LDN, CDE Pager: [phone] After hours Pager: [phone]

## 2016-11-04 NOTE — Progress Notes (Addendum)
  Received a call from the patient's nurse that he has experienced recurrent episodes of mild chest pain throughout the evening. Relieved with SL NTG. EKG reviewed which shows no acute changes. For CABG on 11/09/2016.  Remains on Heparin. Will order IV NTG (BP stable at 123/45 - 156/75 in the past 24 hours). D/c Imdur at this time.  Signed, Erma Heritage, PA-C 11/04/2016, 6:55 PM Pager: [phone]

## 2016-11-04 NOTE — Care Management Important Message (Signed)
Important Message  Patient Details  Name: Edward [name] MRN: [id number] Date of Birth: [birth year]   Medicare Important Message Given:  Yes    Nathen May 11/04/2016, 1:37 PM

## 2016-11-04 NOTE — Progress Notes (Signed)
Patient Name: Edward [name] Date of Encounter: 11/04/2016  Primary Cardiologist: HWB Blenda Bridegroom, M.D.  Hospital Problem List     Principal Problem:   Atherosclerosis of native coronary artery of native heart with unstable angina pectoris (HCC) Active Problems:   Dyslipidemia, goal LDL below 70   HTN (hypertension)   Essential tremor   Type 2 diabetes mellitus with circulatory disorder, with long-term current use of insulin (HCC)   Chronic diastolic HF (heart failure) (HCC)   Chronic kidney disease, stage III (moderate)    Subjective   Did have 5/10 chest pressure this morning that was relieved with 2 SL nitro and having a BM.   Inpatient Medications    Scheduled Meds: . amLODipine  10 mg Oral Daily  . aspirin EC  81 mg Oral Daily  . atorvastatin  80 mg Oral q1800  . dicyclomine  10 mg Oral TID AC  . fenofibrate  160 mg Oral Daily  . fluticasone furoate-vilanterol  1 puff Inhalation Daily  . furosemide  40 mg Oral Daily  . gabapentin  300 mg Oral BID  . insulin aspart  0-20 Units Subcutaneous TID WC  . insulin aspart protamine- aspart  60 Units Subcutaneous BID WC  . isosorbide mononitrate  90 mg Oral Daily  . pantoprazole  40 mg Oral BID  . potassium chloride  10 mEq Oral BID  . propranolol  40 mg Oral TID  . ranolazine  1,000 mg Oral BID  . sodium chloride flush  3 mL Intravenous Q12H   Continuous Infusions: . heparin 1,300 Units/hr (11/04/16 0817)   PRN Meds: sodium chloride, acetaminophen, albuterol, clonazePAM, magnesium hydroxide, meclizine, nitroGLYCERIN, ondansetron (ZOFRAN) IV, polyvinyl alcohol   Vital Signs    Vitals:   11/03/16 1603 11/03/16 2017 11/04/16 0432 11/04/16 0947  BP: (!) 129/41 (!) 156/75 (!) 143/45 (!) 139/50  Pulse: 62 65 (!) 56 (!) 58  Resp:  18 18   Temp: 98.3 F (36.8 C) 97.8 F (36.6 C) 97.9 F (36.6 C)   TempSrc: Oral Oral Oral   SpO2: 100% 99% 100%   Weight:   267 lb 4.8 oz (121.2 kg)   Height:        Intake/Output  Summary (Last 24 hours) at 11/04/16 1100 Last data filed at 11/04/16 0800  Gross per 24 hour  Intake           313.47 ml  Output               90 ml  Net           223.47 ml   Filed Weights   11/02/16 0500 11/03/16 0300 11/04/16 0432  Weight: 273 lb 13 oz (124.2 kg) 274 lb 8 oz (124.5 kg) 267 lb 4.8 oz (121.2 kg)    Physical Exam  Obese. In no acute distress. GEN: Well nourished, well developed, in no acute distress.  HEENT: Grossly normal.  Neck: Supple, no JVD, carotid bruits, or masses. Cardiac: RRR. There is a 2/6 systolic murmur at the right upper sternal border. No rub or gallop. No clubbing, cyanosis, edema.  Radials/DP/PT 2+ and equal bilaterally.  Respiratory:  Respirations regular and unlabored, clear to auscultation bilaterally. GI: Soft, nontender, nondistended, BS + x 4. MS: no deformity or atrophy. Skin: warm and dry, no rash. Neuro:  Strength and sensation are intact. Psych: AAOx3.  Normal affect.  Labs    CBC  Recent Labs  11/03/16 0257 11/04/16 0401  WBC 7.5 7.6  HGB 11.3* 11.2*  HCT 33.9* 34.1*  MCV 85.0 84.6  PLT 265 [id number]   Basic Metabolic Panel  Recent Labs  11/03/16 0257 11/04/16 0401  NA 139 137  K 3.9 4.6  CL 105 103  CO2 29 26  GLUCOSE 88 231*  BUN 14 13  CREATININE 1.38* 1.40*  CALCIUM 8.8* 8.7*   Liver Function Tests No results for input(s): AST, ALT, ALKPHOS, BILITOT, PROT, ALBUMIN in the last 72 hours. No results for input(s): LIPASE, AMYLASE in the last 72 hours. Cardiac Enzymes No results for input(s): CKTOTAL, CKMB, CKMBINDEX, TROPONINI in the last 72 hours.   Telemetry    Normal sinus rhythm with occasional PVCs. - Personally Reviewed  ECG    SR with known 1st degree AVB with - Personally Reviewed  Radiology    No results found.  Cardiac Studies   Cardiac catheterization 11/02/16: Diagram         Patient Profile     76 year old gentleman with type 2 diabetes, hyperlipidemia, coronary artery disease with  prior LAD stent and high-grade ostial circumflex with moderate distal left main (40-50%) who presents on this occasion with accelerating episodes of chest discomfort felt to represent angina. No ischemic EKG changes or cardiac marker evidence of myocardial injury. The plan is for repeat coronary angiography to rule out progression of distal left main and circumflex disease. Previously seen and family were 2017 by Dr. Modesto Charon and the decision made at that time to treat medically and if therapy becomes inadequate to control symptoms, consider high risk PCI versus coronary grafting. Repeat cardiac catheterization on 11/02/16 as noted above reveals progression of left main disease  Assessment & Plan    1. Native coronary artery disease: Cath revealed progression of left main disease. Digital images were reviewed by Dr. Tamala Julian and are as noted in the above diagram. TCTS, Dr. Roxan Hockey has been reconsulted. Plan for plavix washout, with surgery early next week.   2. Chest pain, probably ischemic. Some features of the patient's chest pain syndrome are atypical. With progression of left main disease, bypass surgery with LIMA to LAD and saphenous vein graft to circumflex is reasonable. Did have episode of chest pain this morning that was relieved with 2 SL nitro. EKG with no change.  3. 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 Kidney function is stable post-cath with creatinine of 1.40. Resumed on oral diuretic to maintain volume status.  4. Chronic diastolic heart failure: Usual diuretic regimen is furosemide 40 mg daily. This was resumed yesterday with stable Cr today.   Signed, Reino Bellis, NP  11/04/2016, 11:00 AM   The patient has been seen in conjunction with Reino Bellis, NP-C. All aspects of care have been considered and discussed. The patient has been personally interviewed, examined, and all clinical data has been reviewed.   Stable without recurrent angina. Ambulating without  difficulty.  Diuretic therapy resumed yesterday. Creatinine 1.4 today. This needs to be watched closely. Baseline is usually in the 1.4-1.6 range.

## 2016-11-04 NOTE — Progress Notes (Signed)
      [address]       [place] [address]             [phone]      No complaints. Had some CP earlier, but resolved quickly  BP 130/63 (BP Location: Right Arm)   Pulse 61   Temp 98.1 F (36.7 C) (Oral)   Resp 20   Ht 5\' 8"  (1.727 m)   Wt 267 lb 4.8 oz (121.2 kg)   SpO2 96%   BMI 40.64 kg/m    Intake/Output Summary (Last 24 hours) at 11/04/16 1755 Last data filed at 11/04/16 1300  Gross per 24 hour  Intake              480 ml  Output                0 ml  Net              480 ml   For CABG on Wed 1/10.  Revonda Standard Roxan Hockey, MD [facility] [phone]

## 2016-11-04 NOTE — Care Management Note (Addendum)
Case Management Note Marvetta Gibbons RN, BSN Unit 2W-Case Manager [phone]  Patient Details  Name: KAAMIL REINA MRN: [id number] Date of Birth: November 28, 1940  Subjective/Objective:   Pt admitted with CAD s/p cath - tx from 4N on 11/03/16                Action/Plan: PTA pt lived at home - plan for CABG next week on 1/10- CM to continue to follow for post op needs  Expected Discharge Date:                  Expected Discharge Plan:  Home/Self Care  In-House Referral:     Discharge planning Services     Post Acute Care Choice:    Choice offered to:     DME Arranged:    DME Agency:     HH Arranged:    [place] Agency:     Status of Service:  In process, will continue to follow  If discussed at Long Length of Stay Meetings, dates discussed:    Additional Comments:  Dawayne Patricia, RN 11/04/2016, 3:33 PM

## 2016-11-04 NOTE — Progress Notes (Signed)
ANTICOAGULATION CONSULT NOTE - Follow Up Consult  Pharmacy Consult for heparin Indication: chest pain/ACS  No Known Allergies  Patient Measurements: Height: 5\' 8"  (172.7 cm) Weight: 267 lb 4.8 oz (121.2 kg) IBW/kg (Calculated) : 68.4 Heparin Dosing Weight: 97 kg  Vital Signs: Temp: 97.9 F (36.6 C) (01/05 0432) Temp Source: Oral (01/05 0432) BP: 143/45 (01/05 0432) Pulse Rate: 56 (01/05 0432)  Labs:  Recent Labs  11/02/16 0613  11/03/16 0257 11/03/16 0857 11/03/16 2003 11/04/16 0401  HGB 11.9*  --  11.3*  --   --  11.2*  HCT 35.6*  --  33.9*  --   --  34.1*  PLT 243  --  265  --   --  240  HEPARINUNFRC 0.26*  < >  --  0.29* 0.50 0.68  CREATININE 1.32*  --  1.38*  --   --  1.40*  < > = values in this interval not displayed.  Estimated Creatinine Clearance: 58.6 mL/min (by C-G formula based on SCr of 1.4 mg/dL (H)).    Assessment: 50 YOM with CP s/p cath with progression of L main disease with plans for CABG next week.  Pharmacy dosing heparin and heparin level is at goal -heparin level= 0.68  Goal of Therapy:  Heparin level 0.3-0.7 units/ml Monitor platelets by anticoagulation protocol: Yes   Plan:  No heparin changes needed Daily HL/CBC  Hildred Laser, Pharm D 11/04/2016 7:59 AM

## 2016-11-04 NOTE — Progress Notes (Addendum)
Inpatient Diabetes Program Recommendations  AACE/ADA: New Consensus Statement on Inpatient Glycemic Control (2015)  Target Ranges:  Prepandial:   less than 140 mg/dL      Peak postprandial:   less than 180 mg/dL (1-2 hours)      Critically ill patients:  140 - 180 mg/dL   Lab Results  Component Value Date   GLUCAP 238 (H) 11/04/2016   HGBA1C 9.4 09/13/2016   Results for Edward [name], Edward [name] (MRN [id number]) as of 11/04/2016 08:27  Ref. Range 11/03/2016 07:36 11/03/2016 11:08 11/03/2016 13:41 11/03/2016 16:36 11/03/2016 21:18 11/04/2016 06:25  Glucose-Capillary Latest Ref Range: 65 - 99 mg/dL 120 (H) 140 (H) 96 200 (H) 237 (H) 238 (H)  Results for Edward [name], Edward [name] (MRN [id number]) as of 11/04/2016 14:21  Ref. Range 11/04/2016 11:21  Glucose-Capillary Latest Ref Range: 65 - 99 mg/dL 225 (H)   Review of Glycemic Control  Diabetes history:     DM2, Obesity, Creatinine=1.38, GFR=57 Outpatient Diabetes medications:     70/30 70 units TID with meals, Regular 20-36 units TID with meals Current orders for Inpatient glycemic control:     70/30 60 units BID, Novolog 0-20 units TID with meals  Inpatient Diabetes Program Recommendations:    Please consider increasing Novolog 70/30 Mix to 70 units BID.    Consult to RD made to address obesity and how this affects glycemic control.  Thank you,  Windy Carina, RN, MSN Diabetes Coordinator Inpatient Diabetes Program [phone] (Team Pager)

## 2016-11-05 LAB — CBC
HCT: 34.4 % — ABNORMAL LOW (ref 39.0–52.0)
HEMOGLOBIN: 11.5 g/dL — AB (ref 13.0–17.0)
MCH: 28.2 pg (ref 26.0–34.0)
MCHC: 33.4 g/dL (ref 30.0–36.0)
MCV: 84.3 fL (ref 78.0–100.0)
Platelets: 269 10*3/uL (ref 150–400)
RBC: 4.08 MIL/uL — ABNORMAL LOW (ref 4.22–5.81)
RDW: 13.2 % (ref 11.5–15.5)
WBC: 7.2 10*3/uL (ref 4.0–10.5)

## 2016-11-05 LAB — GLUCOSE, CAPILLARY
GLUCOSE-CAPILLARY: 187 mg/dL — AB (ref 65–99)
GLUCOSE-CAPILLARY: 201 mg/dL — AB (ref 65–99)
GLUCOSE-CAPILLARY: 204 mg/dL — AB (ref 65–99)
GLUCOSE-CAPILLARY: 208 mg/dL — AB (ref 65–99)

## 2016-11-05 LAB — BASIC METABOLIC PANEL
Anion gap: 7 (ref 5–15)
BUN: 16 mg/dL (ref 6–20)
CHLORIDE: 103 mmol/L (ref 101–111)
CO2: 30 mmol/L (ref 22–32)
CREATININE: 1.58 mg/dL — AB (ref 0.61–1.24)
Calcium: 8.9 mg/dL (ref 8.9–10.3)
GFR calc Af Amer: 48 mL/min — ABNORMAL LOW (ref >60)
GFR calc non Af Amer: 41 mL/min — ABNORMAL LOW (ref >60)
GLUCOSE: 88 mg/dL (ref 65–99)
POTASSIUM: 3.9 mmol/L (ref 3.5–5.1)
SODIUM: 140 mmol/L (ref 135–145)

## 2016-11-05 LAB — HEPARIN LEVEL (UNFRACTIONATED): Heparin Unfractionated: 0.59 IU/mL (ref 0.30–0.70)

## 2016-11-05 NOTE — Progress Notes (Signed)
ANTICOAGULATION CONSULT NOTE - Follow Up Consult  Pharmacy Consult for heparin Indication: chest pain/ACS  No Known Allergies  Patient Measurements: Height: 5\' 8"  (172.7 cm) Weight: 264 lb 6.4 oz (119.9 kg) IBW/kg (Calculated) : 68.4 Heparin Dosing Weight: 97 kg  Vital Signs: Temp: 97.5 F (36.4 C) (01/06 0425) Temp Source: Oral (01/06 0425) BP: 122/49 (01/06 0922) Pulse Rate: 61 (01/06 0922)  Labs:  Recent Labs  11/03/16 0257  11/03/16 2003 11/04/16 0401 11/05/16 0211  HGB 11.3*  --   --  11.2* 11.5*  HCT 33.9*  --   --  34.1* 34.4*  PLT 265  --   --  240 269  HEPARINUNFRC  --   < > 0.50 0.68 0.59  CREATININE 1.38*  --   --  1.40* 1.58*  < > = values in this interval not displayed.  Estimated Creatinine Clearance: 51.6 mL/min (by C-G formula based on SCr of 1.58 mg/dL (H)).    Assessment: 6 YOM with CP s/p cath with progression of L main disease with plans for CABG next week.  Pharmacy dosing heparin. Heparin level remains at goal 0.59. CBC stable, no bleed documented.  Goal of Therapy:  Heparin level 0.3-0.7 units/ml Monitor platelets by anticoagulation protocol: Yes   Plan:  Heparin at 1300 units/h Daily heparin level, CBC Monitor for s/sx bleeding CABG next week (last dose Plavix 1/3)   Elicia Lamp, PharmD, BCPS Clinical Pharmacist 11/05/2016 10:42 AM

## 2016-11-05 NOTE — Progress Notes (Signed)
  CARDIAC REHAB PHASE I  Pt with intermittent chest pain, starting on IV nitro. Will follow-up Monday. MD to advise if pt appropriate for ambulation.  Lenna Sciara, RN, BSN 11/05/2016 2:55 PM

## 2016-11-05 NOTE — Progress Notes (Addendum)
Patient Name: Edward [name] Date of Encounter: 11/05/2016  Primary Cardiologist: [facility]WB Blenda Bridegroom, M.D.  Hospital Problem List     Principal Problem:   Atherosclerosis of native coronary artery of native heart with unstable angina pectoris (HCC) Active Problems:   Dyslipidemia, goal LDL below 70   HTN (hypertension)   Essential tremor   Type 2 diabetes mellitus with circulatory disorder, with long-term current use of insulin (HCC)   Chronic diastolic HF (heart failure) (HCC)   Chronic kidney disease, stage III (moderate)    Subjective   Has 2/10 chest pressure this morning - now on IV NTG gtt  Inpatient Medications    Scheduled Meds: . amLODipine  10 mg Oral Daily  . aspirin EC  81 mg Oral Daily  . atorvastatin  80 mg Oral q1800  . dicyclomine  10 mg Oral TID AC  . fenofibrate  160 mg Oral Daily  . fluticasone furoate-vilanterol  1 puff Inhalation Daily  . furosemide  40 mg Oral Daily  . gabapentin  300 mg Oral BID  . insulin aspart  0-20 Units Subcutaneous TID WC  . insulin aspart protamine- aspart  60 Units Subcutaneous BID WC  . pantoprazole  40 mg Oral BID  . potassium chloride  10 mEq Oral BID  . propranolol  40 mg Oral TID  . ranolazine  1,000 mg Oral BID  . sodium chloride flush  3 mL Intravenous Q12H   Continuous Infusions: . heparin 1,300 Units/hr (11/04/16 0817)  . nitroGLYCERIN 5 mcg/min (11/04/16 2104)   PRN Meds: sodium chloride, acetaminophen, albuterol, clonazePAM, magnesium hydroxide, meclizine, nitroGLYCERIN, ondansetron (ZOFRAN) IV, polyvinyl alcohol   Vital Signs    Vitals:   11/04/16 1839 11/04/16 1845 11/04/16 2149 11/05/16 0425  BP: (!) 140/54 (!) 123/47 (!) 138/52 (!) 153/64  Pulse: 64 65 61 (!) 54  Resp:   18 18  Temp:   98.1 F (36.7 C) 97.5 F (36.4 C)  TempSrc:   Oral Oral  SpO2: 100% 99% 98% 100%  Weight:    264 lb 6.4 oz (119.9 kg)  Height:        Intake/Output Summary (Last 24 hours) at 11/05/16 0852 Last data filed at  11/04/16 2104  Gross per 24 hour  Intake              480 ml  Output              350 ml  Net              130 ml   Filed Weights   11/03/16 0300 11/04/16 0432 11/05/16 0425  Weight: 274 lb 8 oz (124.5 kg) 267 lb 4.8 oz (121.2 kg) 264 lb 6.4 oz (119.9 kg)    Physical Exam  Obese. In no acute distress. GEN: Well nourished, well developed, in no acute distress.  HEENT: Grossly normal.  Neck: Supple, no JVD, carotid bruits, or masses. Cardiac: RRR. There is a 2/6 systolic murmur at the right upper sternal border. No rub or gallop. No clubbing, cyanosis, edema.  Radials/DP/PT 2+ and equal bilaterally.  Respiratory:  Respirations regular and unlabored, clear to auscultation bilaterally. GI: Soft, nontender, nondistended, BS + x 4. MS: no deformity or atrophy. Skin: warm and dry, no rash. Neuro:  Strength and sensation are intact. Psych: AAOx3.  Normal affect.  Labs    CBC  Recent Labs  11/04/16 0401 11/05/16 0211  WBC 7.6 7.2  HGB 11.2* 11.5*  HCT 34.1* 34.4*  MCV 84.6 84.3  PLT 240 [id number]   Basic Metabolic Panel  Recent Labs  11/04/16 0401 11/05/16 0211  NA 137 140  K 4.6 3.9  CL 103 103  CO2 26 30  GLUCOSE 231* 88  BUN 13 16  CREATININE 1.40* 1.58*  CALCIUM 8.7* 8.9   Liver Function Tests No results for input(s): AST, ALT, ALKPHOS, BILITOT, PROT, ALBUMIN in the last 72 hours. No results for input(s): LIPASE, AMYLASE in the last 72 hours. Cardiac Enzymes No results for input(s): CKTOTAL, CKMB, CKMBINDEX, TROPONINI in the last 72 hours.   Telemetry    Normal sinus rhythm with occasional PVCs. - Personally Reviewed  ECG    SR with known 1st degree AVB with - Personally Reviewed  Radiology    No results found.  Cardiac Studies   Cardiac catheterization 11/02/16: Diagram         Patient Profile     76 year old gentleman with type 2 diabetes, hyperlipidemia, coronary artery disease with prior LAD stent and high-grade ostial circumflex with  moderate distal left main (40-50%) who presents on this occasion with accelerating episodes of chest discomfort felt to represent angina. No ischemic EKG changes or cardiac marker evidence of myocardial injury. Previously seen and family were 2017 by Dr. Modesto Charon and the decision made at that time to treat medically and if therapy becomes inadequate to control symptoms, consider high risk PCI versus coronary grafting. Repeat cardiac catheterization on 11/02/16 as noted above reveals progression of left main disease  Assessment & Plan    1. Native coronary artery disease: Cath revealed progression of left main disease. Plan for plavix washout, with surgery 11/09/2016.   2. Chest pain, probably ischemic. Some features of the patient's chest pain syndrome are atypical. With progression of left main disease, bypass surgery with LIMA to LAD and saphenous vein graft to circumflex is reasonable. Did have episode of chest pain this morning and now on IV NTG gtt.   3. 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 Kidney function is stable post-cath with creatinine of 1.40. Resumed on oral diuretic to maintain volume status.  4. Chronic diastolic heart failure: Usual diuretic regimen is furosemide 40 mg daily. Creatinine bumped this am so will hold Lasix today.  Signed, Fransico Him, MD  11/05/2016, 8:52 AM

## 2016-11-06 LAB — HEPARIN LEVEL (UNFRACTIONATED): Heparin Unfractionated: 0.58 IU/mL (ref 0.30–0.70)

## 2016-11-06 LAB — CBC
HEMATOCRIT: 34.5 % — AB (ref 39.0–52.0)
Hemoglobin: 11.6 g/dL — ABNORMAL LOW (ref 13.0–17.0)
MCH: 28.3 pg (ref 26.0–34.0)
MCHC: 33.6 g/dL (ref 30.0–36.0)
MCV: 84.1 fL (ref 78.0–100.0)
PLATELETS: 287 10*3/uL (ref 150–400)
RBC: 4.1 MIL/uL — AB (ref 4.22–5.81)
RDW: 13.3 % (ref 11.5–15.5)
WBC: 7.5 10*3/uL (ref 4.0–10.5)

## 2016-11-06 LAB — BASIC METABOLIC PANEL
ANION GAP: 6 (ref 5–15)
BUN: 17 mg/dL (ref 6–20)
CALCIUM: 9 mg/dL (ref 8.9–10.3)
CHLORIDE: 103 mmol/L (ref 101–111)
CO2: 30 mmol/L (ref 22–32)
Creatinine, Ser: 1.73 mg/dL — ABNORMAL HIGH (ref 0.61–1.24)
GFR calc Af Amer: 43 mL/min — ABNORMAL LOW (ref >60)
GFR calc non Af Amer: 37 mL/min — ABNORMAL LOW (ref >60)
GLUCOSE: 141 mg/dL — AB (ref 65–99)
Potassium: 4.2 mmol/L (ref 3.5–5.1)
Sodium: 139 mmol/L (ref 135–145)

## 2016-11-06 LAB — GLUCOSE, CAPILLARY
GLUCOSE-CAPILLARY: 228 mg/dL — AB (ref 65–99)
GLUCOSE-CAPILLARY: 224 mg/dL — AB (ref 65–99)
Glucose-Capillary: 196 mg/dL — ABNORMAL HIGH (ref 65–99)
Glucose-Capillary: 188 mg/dL — ABNORMAL HIGH (ref 65–99)

## 2016-11-06 NOTE — Progress Notes (Signed)
ANTICOAGULATION CONSULT NOTE - Follow Up Consult  Pharmacy Consult for heparin Indication: chest pain/ACS  No Known Allergies  Patient Measurements: Height: 5\' 8"  (172.7 cm) Weight: 265 lb 9.6 oz (120.5 kg) IBW/kg (Calculated) : 68.4 Heparin Dosing Weight: 97 kg  Vital Signs: Temp: 98.2 F (36.8 C) (01/07 0632) Temp Source: Oral (01/07 [id number]) BP: 129/73 (01/07 [id number]) Pulse Rate: 58 (01/07 0632)  Labs:  Recent Labs  11/04/16 0401 11/05/16 0211 11/06/16 0301  HGB 11.2* 11.5* 11.6*  HCT 34.1* 34.4* 34.5*  PLT 240 269 287  HEPARINUNFRC 0.68 0.59 0.58  CREATININE 1.40* 1.58* 1.73*    Estimated Creatinine Clearance: 47.3 mL/min (by C-G formula based on SCr of 1.73 mg/dL (H)).    Assessment: 24 YOM with CP s/p cath with progression of L main disease with plans for CABG next week.  Pharmacy dosing heparin. Heparin level remains at goal 0.58. CBC stable, no bleed documented.  Goal of Therapy:  Heparin level 0.3-0.7 units/ml Monitor platelets by anticoagulation protocol: Yes   Plan:  Heparin at 1300 units/h Daily heparin level, CBC Monitor for s/sx bleeding CABG for 1/10 (last dose Plavix 1/3)   Elicia Lamp, PharmD, BCPS Clinical Pharmacist 11/06/2016 10:18 AM

## 2016-11-06 NOTE — Progress Notes (Signed)
Patient Name: Edward [name] Date of Encounter: 11/06/2016  Primary Cardiologist: HWB Blenda Bridegroom, M.D.  Hospital Problem List     Principal Problem:   Atherosclerosis of native coronary artery of native heart with unstable angina pectoris (HCC) Active Problems:   Dyslipidemia, goal LDL below 70   HTN (hypertension)   Essential tremor   Type 2 diabetes mellitus with circulatory disorder, with long-term current use of insulin (HCC)   Chronic diastolic HF (heart failure) (HCC)   Chronic kidney disease, stage III (moderate)    Subjective   No further CP.  Complains of LE edema  Inpatient Medications    Scheduled Meds: . amLODipine  10 mg Oral Daily  . aspirin EC  81 mg Oral Daily  . atorvastatin  80 mg Oral q1800  . dicyclomine  10 mg Oral TID AC  . fenofibrate  160 mg Oral Daily  . fluticasone furoate-vilanterol  1 puff Inhalation Daily  . gabapentin  300 mg Oral BID  . insulin aspart  0-20 Units Subcutaneous TID WC  . insulin aspart protamine- aspart  60 Units Subcutaneous BID WC  . pantoprazole  40 mg Oral BID  . potassium chloride  10 mEq Oral BID  . propranolol  40 mg Oral TID  . ranolazine  1,000 mg Oral BID  . sodium chloride flush  3 mL Intravenous Q12H   Continuous Infusions: . heparin 1,300 Units/hr (11/04/16 0817)  . nitroGLYCERIN 5 mcg/min (11/04/16 2104)   PRN Meds: sodium chloride, acetaminophen, albuterol, clonazePAM, magnesium hydroxide, meclizine, nitroGLYCERIN, ondansetron (ZOFRAN) IV, polyvinyl alcohol   Vital Signs    Vitals:   11/05/16 0922 11/05/16 2038 11/06/16 0346 11/06/16 0632  BP: (!) 122/49 (!) 156/50  129/73  Pulse: 61 65  (!) 58  Resp:  18  18  Temp:  98 F (36.7 C)  98.2 F (36.8 C)  TempSrc:  Oral  Oral  SpO2:  100%  100%  Weight:   265 lb 9.6 oz (120.5 kg)   Height:        Intake/Output Summary (Last 24 hours) at 11/06/16 0839 Last data filed at 11/05/16 1730  Gross per 24 hour  Intake              720 ml  Output               400 ml  Net              320 ml   Filed Weights   11/04/16 0432 11/05/16 0425 11/06/16 0346  Weight: 267 lb 4.8 oz (121.2 kg) 264 lb 6.4 oz (119.9 kg) 265 lb 9.6 oz (120.5 kg)    Physical Exam  Obese. In no acute distress. GEN: Well nourished, well developed, in no acute distress.  HEENT: Grossly normal.  Neck: Supple, no JVD, carotid bruits, or masses. Cardiac: RRR. There is a 2/6 systolic murmur at the right upper sternal border. No rub or gallop. No clubbing, cyanosis.  1+ pedal edema.  Radials/DP/PT 2+ and equal bilaterally.  Respiratory:  Respirations regular and unlabored, clear to auscultation bilaterally. GI: Soft, nontender, nondistended, BS + x 4. MS: no deformity or atrophy. Skin: warm and dry, no rash. Neuro:  Strength and sensation are intact. Psych: AAOx3.  Normal affect.  Labs    CBC  Recent Labs  11/05/16 0211 11/06/16 0301  WBC 7.2 7.5  HGB 11.5* 11.6*  HCT 34.4* 34.5*  MCV 84.3 84.1  PLT 269 287   Basic  Metabolic Panel  Recent Labs  11/05/16 0211 11/06/16 0301  NA 140 139  K 3.9 4.2  CL 103 103  CO2 30 30  GLUCOSE 88 141*  BUN 16 17  CREATININE 1.58* 1.73*  CALCIUM 8.9 9.0   Liver Function Tests No results for input(s): AST, ALT, ALKPHOS, BILITOT, PROT, ALBUMIN in the last 72 hours. No results for input(s): LIPASE, AMYLASE in the last 72 hours. Cardiac Enzymes No results for input(s): CKTOTAL, CKMB, CKMBINDEX, TROPONINI in the last 72 hours.   Telemetry    Normal sinus rhythm with occasional PVCs. - Personally Reviewed  ECG    SR with known 1st degree AVB with - Personally Reviewed  Radiology    No results found.  Cardiac Studies   Cardiac catheterization 11/02/16: Diagram         Patient Profile     76 year old gentleman with type 2 diabetes, hyperlipidemia, coronary artery disease with prior LAD stent and high-grade ostial circumflex with moderate distal left main (40-50%) who presents on this occasion with  accelerating episodes of chest discomfort felt to represent angina. No ischemic EKG changes or cardiac marker evidence of myocardial injury. Previously seen and family were 2017 by Dr. Modesto Charon and the decision made at that time to treat medically and if therapy becomes inadequate to control symptoms, consider high risk PCI versus coronary grafting. Repeat cardiac catheterization on 11/02/16 as noted above reveals progression of left main disease  Assessment & Plan    1. Native coronary artery disease: Cath revealed progression of left main disease. Plan for plavix washout, with surgery 11/09/2016.   2. Chest pain, probably ischemic. Some features of the patient's chest pain syndrome are atypical. With progression of left main disease, bypass surgery with LIMA to LAD and saphenous vein graft to circumflex is reasonable. Did have episode of chest pain this morning and now on IV NTG gtt.   3. 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 Kidney function is stable post-cath with creatinine of 1.40. Creatinine bumped yesterday to 1.58 and Lasix stopped.  Creatinine now 1.73.    4. Chronic diastolic heart failure: Usual diuretic regimen is furosemide 40 mg daily. Lasix on hold for elevated creatinine.  Creatinine now bumped from 1.58>>1.73. Will continue to follow renal function off diuretics.  May need gentle hydration if creatinine increases further. C/O LE edema but lungs are clear.  Will start compression hose.   Signed, Fransico Him, MD  11/06/2016, 8:39 AM

## 2016-11-07 DIAGNOSIS — I119 Hypertensive heart disease without heart failure: Secondary | ICD-10-CM

## 2016-11-07 DIAGNOSIS — I25110 Atherosclerotic heart disease of native coronary artery with unstable angina pectoris: Secondary | ICD-10-CM

## 2016-11-07 LAB — GLUCOSE, CAPILLARY
GLUCOSE-CAPILLARY: 173 mg/dL — AB (ref 65–99)
GLUCOSE-CAPILLARY: 305 mg/dL — AB (ref 65–99)
Glucose-Capillary: 180 mg/dL — ABNORMAL HIGH (ref 65–99)
Glucose-Capillary: 176 mg/dL — ABNORMAL HIGH (ref 65–99)

## 2016-11-07 LAB — BASIC METABOLIC PANEL
Anion gap: 8 (ref 5–15)
BUN: 23 mg/dL — AB (ref 6–20)
CALCIUM: 8.9 mg/dL (ref 8.9–10.3)
CHLORIDE: 103 mmol/L (ref 101–111)
CO2: 26 mmol/L (ref 22–32)
CREATININE: 1.58 mg/dL — AB (ref 0.61–1.24)
GFR calc non Af Amer: 41 mL/min — ABNORMAL LOW (ref >60)
GFR, EST AFRICAN AMERICAN: 48 mL/min — AB (ref >60)
Glucose, Bld: 217 mg/dL — ABNORMAL HIGH (ref 65–99)
Potassium: 4.4 mmol/L (ref 3.5–5.1)
SODIUM: 137 mmol/L (ref 135–145)

## 2016-11-07 LAB — CBC
HCT: 36.4 % — ABNORMAL LOW (ref 39.0–52.0)
Hemoglobin: 11.9 g/dL — ABNORMAL LOW (ref 13.0–17.0)
MCH: 27.9 pg (ref 26.0–34.0)
MCHC: 32.7 g/dL (ref 30.0–36.0)
MCV: 85.4 fL (ref 78.0–100.0)
PLATELETS: 300 10*3/uL (ref 150–400)
RBC: 4.26 MIL/uL (ref 4.22–5.81)
RDW: 13.8 % (ref 11.5–15.5)
WBC: 8.3 10*3/uL (ref 4.0–10.5)

## 2016-11-07 LAB — HEPARIN LEVEL (UNFRACTIONATED): HEPARIN UNFRACTIONATED: 0.61 [IU]/mL (ref 0.30–0.70)

## 2016-11-07 MED ORDER — INSULIN ASPART 100 UNIT/ML ~~LOC~~ SOLN
5.0000 [IU] | Freq: Three times a day (TID) | SUBCUTANEOUS | Status: DC
  Administered 2016-11-07: 5 [IU] via SUBCUTANEOUS

## 2016-11-07 MED ORDER — INSULIN ASPART PROT & ASPART (70-30 MIX) 100 UNIT/ML ~~LOC~~ SUSP
70.0000 [IU] | Freq: Two times a day (BID) | SUBCUTANEOUS | Status: DC
  Administered 2016-11-08 (×2): 70 [IU] via SUBCUTANEOUS

## 2016-11-07 NOTE — Progress Notes (Signed)
Patient Name: JESUSANGEL DICOCHEA Date of Encounter: 11/07/2016  Primary Cardiologist: HWB Blenda Bridegroom, M.D.  Hospital Problem List     Principal Problem:   Atherosclerosis of native coronary artery of native heart with unstable angina pectoris (HCC) Active Problems:   Dyslipidemia, goal LDL below 70   HTN (hypertension)   Essential tremor   Type 2 diabetes mellitus with circulatory disorder, with long-term current use of insulin (HCC)   Chronic diastolic HF (heart failure) (HCC)   Chronic kidney disease, stage III (moderate)    Subjective   No chest pain this morning. Does report some LE edema.   Inpatient Medications    Scheduled Meds: . amLODipine  10 mg Oral Daily  . aspirin EC  81 mg Oral Daily  . atorvastatin  80 mg Oral q1800  . dicyclomine  10 mg Oral TID AC  . fenofibrate  160 mg Oral Daily  . fluticasone furoate-vilanterol  1 puff Inhalation Daily  . gabapentin  300 mg Oral BID  . insulin aspart  0-20 Units Subcutaneous TID WC  . insulin aspart protamine- aspart  60 Units Subcutaneous BID WC  . pantoprazole  40 mg Oral BID  . potassium chloride  10 mEq Oral BID  . propranolol  40 mg Oral TID  . ranolazine  1,000 mg Oral BID  . sodium chloride flush  3 mL Intravenous Q12H   Continuous Infusions: . heparin 1,300 Units/hr (11/07/16 0400)  . nitroGLYCERIN 15 mcg/min (11/07/16 0000)   PRN Meds: sodium chloride, acetaminophen, albuterol, clonazePAM, magnesium hydroxide, meclizine, nitroGLYCERIN, ondansetron (ZOFRAN) IV, polyvinyl alcohol   Vital Signs    Vitals:   11/06/16 1158 11/06/16 1627 11/06/16 2128 11/07/16 0433  BP: 134/65 (!) 133/55 (!) 126/50 (!) 135/54  Pulse: 63 68 63 (!) 58  Resp:      Temp:  98.1 F (36.7 C) 98.2 F (36.8 C) 97.8 F (36.6 C)  TempSrc:  Oral Oral Oral  SpO2:  99% 97% 100%  Weight:    264 lb 12.8 oz (120.1 kg)  Height:        Intake/Output Summary (Last 24 hours) at 11/07/16 0847 Last data filed at 11/07/16 0400  Gross per  24 hour  Intake          1789.05 ml  Output              801 ml  Net           988.05 ml   Filed Weights   11/05/16 0425 11/06/16 0346 11/07/16 0433  Weight: 264 lb 6.4 oz (119.9 kg) 265 lb 9.6 oz (120.5 kg) 264 lb 12.8 oz (120.1 kg)    Physical Exam  Obese. In no acute distress. GEN: Well nourished, well developed, in no acute distress.  HEENT: Grossly normal.  Neck: Supple, no JVD, carotid bruits, or masses. Cardiac: RRR. There is a 2/6 systolic murmur at the right upper sternal border. No rub or gallop. No clubbing, cyanosis.  1+ pedal edema.  Radials/DP/PT 2+ and equal bilaterally. Compression stockings in place Respiratory:  Respirations regular and unlabored, clear to auscultation bilaterally. GI: Soft, nontender, nondistended, BS + x 4. MS: no deformity or atrophy. Skin: warm and dry, no rash. Neuro:  Strength and sensation are intact. Psych: AAOx3.  Normal affect.  Labs    CBC  Recent Labs  11/06/16 0301 11/07/16 0435  WBC 7.5 8.3  HGB 11.6* 11.9*  HCT 34.5* 36.4*  MCV 84.1 85.4  PLT 287 300  Basic Metabolic Panel  Recent Labs  11/05/16 0211 11/06/16 0301  NA 140 139  K 3.9 4.2  CL 103 103  CO2 30 30  GLUCOSE 88 141*  BUN 16 17  CREATININE 1.58* 1.73*  CALCIUM 8.9 9.0   Liver Function Tests No results for input(s): AST, ALT, ALKPHOS, BILITOT, PROT, ALBUMIN in the last 72 hours. No results for input(s): LIPASE, AMYLASE in the last 72 hours. Cardiac Enzymes No results for input(s): CKTOTAL, CKMB, CKMBINDEX, TROPONINI in the last 72 hours.   Telemetry    Normal sinus rhythm with occasional PVCs. - Personally Reviewed  ECG    SR with known 1st degree AVB with - Personally Reviewed  Radiology    No results found.  Cardiac Studies   Cardiac catheterization 11/02/16: Diagram        Patient Profile     76 year old gentleman with type 2 diabetes, hyperlipidemia, coronary artery disease with prior LAD stent and high-grade ostial  circumflex with moderate distal left main (40-50%) who presents on this occasion with accelerating episodes of chest discomfort felt to represent angina. No ischemic EKG changes or cardiac marker evidence of myocardial injury. Previously seen and family were 2017 by Dr. Modesto Charon and the decision made at that time to treat medically and if therapy becomes inadequate to control symptoms, consider high risk PCI versus coronary grafting. Repeat cardiac catheterization on 11/02/16 as noted above reveals progression of left main disease  Assessment & Plan    1. Native coronary artery disease: Cath revealed progression of left main disease. Plan for plavix washout, with surgery 11/09/2016.   2. Chest pain, probably ischemic. Some features of the patient's chest pain syndrome are atypical. With progression of left main disease, bypass surgery with LIMA to LAD and saphenous vein graft to circumflex is reasonable. Remains on nitro gtt, no reported chest pain this morning.   3. 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 Kidney function is stable post-cath with creatinine of 1.40. Creatinine bumped 1/6 to 1.58 and Lasix stopped.  Creatinine 1.73 yesterday, BMET pending this morning.  -- Does c/o of some pedal edema, but compression stockings are helping.   4. Chronic diastolic heart failure: Usual diuretic regimen is furosemide 40 mg daily. Lasix on hold for elevated creatinine.  Creatinine now bumped from 1.58>>1.73. Will continue to follow renal function off diuretics. C/O LE edema but lungs remain clear.  -- Weight stable  Signed, Reino Bellis, NP  11/07/2016, 8:47 AM

## 2016-11-07 NOTE — Progress Notes (Signed)
5 Days Post-Op Procedure(s) (LRB): Left Heart Cath and Coronary Angiography (N/A) Subjective: No complaints No issues over the weekend  Objective: Vital signs in last 24 hours: Temp:  [97.8 F (36.6 C)-98.2 F (36.8 C)] 97.8 F (36.6 C) (01/08 0433) Pulse Rate:  [58-68] 60 (01/08 1035) Cardiac Rhythm: Sinus bradycardia;Heart block (01/08 0701) BP: (126-137)/(50-66) 137/66 (01/08 1035) SpO2:  [97 %-100 %] 100 % (01/08 0433) Weight:  [264 lb 12.8 oz (120.1 kg)] 264 lb 12.8 oz (120.1 kg) (01/08 0433)  Hemodynamic parameters for last 24 hours:    Intake/Output from previous day: 01/07 0701 - 01/08 0700 In: 2029.1 [P.O.:780; I.V.:1249.1] Out: 801 [Urine:800; Stool:1] Intake/Output this shift: Total I/O In: 240 [P.O.:240] Out: -   General appearance: alert, cooperative and no distress Neurologic: intact Heart: regular rate and rhythm Lungs: clear to auscultation bilaterally  Lab Results:  Recent Labs  11/06/16 0301 11/07/16 0435  WBC 7.5 8.3  HGB 11.6* 11.9*  HCT 34.5* 36.4*  PLT 287 300   BMET:  Recent Labs  11/06/16 0301 11/07/16 0848  NA 139 137  K 4.2 4.4  CL 103 103  CO2 30 26  GLUCOSE 141* 217*  BUN 17 23*  CREATININE 1.73* 1.58*  CALCIUM 9.0 8.9    PT/INR: No results for input(s): LABPROT, INR in the last 72 hours. ABG    Component Value Date/Time   PHART 7.397 12/21/2015 1545   HCO3 30.0 (H) 12/21/2015 1545   TCO2 31 12/21/2015 1545   O2SAT 93.0 12/21/2015 1545   CBG (last 3)   Recent Labs  11/06/16 2124 11/07/16 0558 11/07/16 1114  GLUCAP 224* 173* 305*    Assessment/Plan: S/P Procedure(s) (LRB): Left Heart Cath and Coronary Angiography (N/A) -For CABG Wed 1/10 CBG elevated- will adjust insulin as they need to be better controlled prior to surgery Creatinine trending down   LOS: 8 days    Melrose Nakayama 11/07/2016

## 2016-11-07 NOTE — Progress Notes (Signed)
Inpatient Diabetes Program Recommendations  AACE/ADA: New Consensus Statement on Inpatient Glycemic Control (2015)  Target Ranges:  Prepandial:   less than 140 mg/dL      Peak postprandial:   less than 180 mg/dL (1-2 hours)      Critically ill patients:  140 - 180 mg/dL   Lab Results  Component Value Date   GLUCAP 173 (H) 11/07/2016   HGBA1C 9.4 09/13/2016   Results for KESON, CREW (MRN [id number]) as of 11/07/2016 08:57  Ref. Range 11/06/2016 06:31 11/06/2016 11:31 11/06/2016 16:25 11/06/2016 21:24 11/07/2016 05:58  Glucose-Capillary Latest Ref Range: 65 - 99 mg/dL 196 (H) 188 (H) 228 (H) 224 (H) 173 (H)   Review of Glycemic Control  DM2, Obesity, Creatinine=1.38, GFR=57 Outpatient Diabetes medications:     70/30 70 units TID with meals, Regular 20-36 units TID with meals Current orders for Inpatient glycemic control:     70/30 60 units BID, Novolog 0-20 units TID with meals  Inpatient Diabetes Program Recommendations: Insulin - Basal: Please consider changing 70/30 to 75 units QAM with breakfast and 65 units with supper.  Thank you,  Windy Carina, RN, MSN Diabetes Coordinator Inpatient Diabetes Program [phone] (Team Pager)

## 2016-11-07 NOTE — Care Management Important Message (Signed)
Important Message  Patient Details  Name: Edward [name] MRN: [id number] Date of Birth: 11-Jun-1941   Medicare Important Message Given:  Yes    Nathen May 11/07/2016, 12:49 PM

## 2016-11-07 NOTE — Progress Notes (Signed)
ANTICOAGULATION CONSULT NOTE - Follow Up Consult  Pharmacy Consult for Heparin Indication: chest pain/ACS  No Known Allergies  Patient Measurements: Height: 5\' 8"  (172.7 cm) Weight: 264 lb 12.8 oz (120.1 kg) IBW/kg (Calculated) : 68.4 Heparin Dosing Weight: 97kg  Vital Signs: Temp: 97.8 F (36.6 C) (01/08 0433) Temp Source: Oral (01/08 0433) BP: 135/54 (01/08 0433) Pulse Rate: 58 (01/08 0433)  Labs:  Recent Labs  11/05/16 0211 11/06/16 0301 11/07/16 0435 11/07/16 0848  HGB 11.5* 11.6* 11.9*  --   HCT 34.4* 34.5* 36.4*  --   PLT 269 287 300  --   HEPARINUNFRC 0.59 0.58 0.61  --   CREATININE 1.58* 1.73*  --  1.58*    Estimated Creatinine Clearance: 51.7 mL/min (by C-G formula based on SCr of 1.58 mg/dL (H)).   Medications:  Heparin @ 1300 units/hr  Assessment: 75yom continues on heparin for progression of his left main disease awaiting CABG 1/10. Heparin level is therapeutic at 0.61. No bleeding.  Goal of Therapy:  Heparin level 0.3-0.7 units/ml Monitor platelets by anticoagulation protocol: Yes   Plan:  1) Continue heparin at 1300 units/hr 2) Daily heparin level and CBC  Deboraha Sprang 11/07/2016,9:45 AM

## 2016-11-08 DIAGNOSIS — I25110 Atherosclerotic heart disease of native coronary artery with unstable angina pectoris: Secondary | ICD-10-CM

## 2016-11-08 LAB — HEPARIN LEVEL (UNFRACTIONATED): HEPARIN UNFRACTIONATED: 0.61 [IU]/mL (ref 0.30–0.70)

## 2016-11-08 LAB — CBC
HCT: 33.4 % — ABNORMAL LOW (ref 39.0–52.0)
HEMOGLOBIN: 11.2 g/dL — AB (ref 13.0–17.0)
MCH: 28.4 pg (ref 26.0–34.0)
MCHC: 33.5 g/dL (ref 30.0–36.0)
MCV: 84.6 fL (ref 78.0–100.0)
PLATELETS: 273 10*3/uL (ref 150–400)
RBC: 3.95 MIL/uL — ABNORMAL LOW (ref 4.22–5.81)
RDW: 13.4 % (ref 11.5–15.5)
WBC: 7.5 10*3/uL (ref 4.0–10.5)

## 2016-11-08 LAB — BASIC METABOLIC PANEL
Anion gap: 7 (ref 5–15)
BUN: 25 mg/dL — AB (ref 6–20)
CO2: 26 mmol/L (ref 22–32)
Calcium: 8.8 mg/dL — ABNORMAL LOW (ref 8.9–10.3)
Chloride: 105 mmol/L (ref 101–111)
Creatinine, Ser: 1.55 mg/dL — ABNORMAL HIGH (ref 0.61–1.24)
GFR calc Af Amer: 49 mL/min — ABNORMAL LOW (ref >60)
GFR, EST NON AFRICAN AMERICAN: 42 mL/min — AB (ref >60)
GLUCOSE: 187 mg/dL — AB (ref 65–99)
POTASSIUM: 4.3 mmol/L (ref 3.5–5.1)
Sodium: 138 mmol/L (ref 135–145)

## 2016-11-08 LAB — GLUCOSE, CAPILLARY
GLUCOSE-CAPILLARY: 210 mg/dL — AB (ref 65–99)
GLUCOSE-CAPILLARY: 153 mg/dL — AB (ref 65–99)
Glucose-Capillary: 184 mg/dL — ABNORMAL HIGH (ref 65–99)
Glucose-Capillary: 160 mg/dL — ABNORMAL HIGH (ref 65–99)

## 2016-11-08 LAB — URINALYSIS, COMPLETE (UACMP) WITH MICROSCOPIC
BILIRUBIN URINE: NEGATIVE
Glucose, UA: 50 mg/dL — AB
HGB URINE DIPSTICK: NEGATIVE
Ketones, ur: NEGATIVE mg/dL
LEUKOCYTES UA: NEGATIVE
NITRITE: NEGATIVE
PROTEIN: NEGATIVE mg/dL
Specific Gravity, Urine: 1.013 (ref 1.005–1.030)
pH: 6 (ref 5.0–8.0)

## 2016-11-08 LAB — ABO/RH: ABO/RH(D): O POS

## 2016-11-08 MED ORDER — CHLORHEXIDINE GLUCONATE CLOTH 2 % EX PADS
6.0000 | MEDICATED_PAD | Freq: Once | CUTANEOUS | Status: AC
  Administered 2016-11-08: 6 via TOPICAL

## 2016-11-08 MED ORDER — BISACODYL 5 MG PO TBEC
5.0000 mg | DELAYED_RELEASE_TABLET | Freq: Once | ORAL | Status: AC
  Administered 2016-11-08: 5 mg via ORAL
  Filled 2016-11-08: qty 1

## 2016-11-08 MED ORDER — PLASMA-LYTE 148 IV SOLN
INTRAVENOUS | Status: AC
  Administered 2016-11-09: 400 mL
  Filled 2016-11-08 (×2): qty 2.5

## 2016-11-08 MED ORDER — DEXTROSE 5 % IV SOLN
750.0000 mg | INTRAVENOUS | Status: DC
  Filled 2016-11-08 (×2): qty 750

## 2016-11-08 MED ORDER — METOPROLOL TARTRATE 12.5 MG HALF TABLET: 12.5000 mg | ORAL_TABLET | Freq: Once | ORAL | Status: DC

## 2016-11-08 MED ORDER — SODIUM CHLORIDE 0.9 % IV SOLN
INTRAVENOUS | Status: AC
  Administered 2016-11-09: 1 [IU]/h via INTRAVENOUS
  Filled 2016-11-08 (×2): qty 2.5

## 2016-11-08 MED ORDER — DEXMEDETOMIDINE HCL IN NACL 400 MCG/100ML IV SOLN
0.1000 ug/kg/h | INTRAVENOUS | Status: AC
  Administered 2016-11-09: .3 ug/kg/h via INTRAVENOUS
  Filled 2016-11-08 (×2): qty 100

## 2016-11-08 MED ORDER — EPINEPHRINE PF 1 MG/ML IJ SOLN
0.0000 ug/min | INTRAMUSCULAR | Status: DC
  Filled 2016-11-08 (×2): qty 4

## 2016-11-08 MED ORDER — PHENYLEPHRINE HCL 10 MG/ML IJ SOLN
0.0000 ug/min | INTRAVENOUS | Status: AC
  Administered 2016-11-09: 10 ug/min via INTRAVENOUS
  Filled 2016-11-08 (×2): qty 2

## 2016-11-08 MED ORDER — HEPARIN SODIUM (PORCINE) 1000 UNIT/ML IJ SOLN
INTRAMUSCULAR | Status: DC
  Filled 2016-11-08 (×2): qty 30

## 2016-11-08 MED ORDER — VANCOMYCIN HCL 10 G IV SOLR
1500.0000 mg | INTRAVENOUS | Status: AC
  Administered 2016-11-09: 1500 mg via INTRAVENOUS
  Filled 2016-11-08 (×2): qty 1500

## 2016-11-08 MED ORDER — MAGNESIUM SULFATE 50 % IJ SOLN
40.0000 meq | INTRAMUSCULAR | Status: DC
  Filled 2016-11-08 (×2): qty 10

## 2016-11-08 MED ORDER — NITROGLYCERIN IN D5W 200-5 MCG/ML-% IV SOLN
2.0000 ug/min | INTRAVENOUS | Status: AC
  Administered 2016-11-09: 5 ug/min via INTRAVENOUS
  Filled 2016-11-08: qty 250

## 2016-11-08 MED ORDER — INSULIN ASPART 100 UNIT/ML ~~LOC~~ SOLN
10.0000 [IU] | Freq: Three times a day (TID) | SUBCUTANEOUS | Status: DC
  Administered 2016-11-08 (×2): 10 [IU] via SUBCUTANEOUS

## 2016-11-08 MED ORDER — TRANEXAMIC ACID (OHS) PUMP PRIME SOLUTION
2.0000 mg/kg | INTRAVENOUS | Status: DC
  Filled 2016-11-08 (×2): qty 2.39

## 2016-11-08 MED ORDER — CHLORHEXIDINE GLUCONATE CLOTH 2 % EX PADS: 6.0000 | MEDICATED_PAD | Freq: Once | CUTANEOUS | Status: DC

## 2016-11-08 MED ORDER — DEXTROSE 5 % IV SOLN
1.5000 g | INTRAVENOUS | Status: AC
  Administered 2016-11-09: .75 g via INTRAVENOUS
  Administered 2016-11-09: 1.5 g via INTRAVENOUS
  Filled 2016-11-08 (×2): qty 1.5

## 2016-11-08 MED ORDER — CHLORHEXIDINE GLUCONATE 0.12 % MT SOLN
15.0000 mL | Freq: Once | OROMUCOSAL | Status: AC
  Administered 2016-11-09: 15 mL via OROMUCOSAL
  Filled 2016-11-08: qty 15

## 2016-11-08 MED ORDER — POTASSIUM CHLORIDE 2 MEQ/ML IV SOLN
80.0000 meq | INTRAVENOUS | Status: DC
  Filled 2016-11-08 (×2): qty 40

## 2016-11-08 MED ORDER — DIAZEPAM 5 MG PO TABS
5.0000 mg | ORAL_TABLET | Freq: Once | ORAL | Status: AC
  Administered 2016-11-09: 5 mg via ORAL
  Filled 2016-11-08: qty 1

## 2016-11-08 MED ORDER — DOPAMINE-DEXTROSE 3.2-5 MG/ML-% IV SOLN
0.0000 ug/kg/min | INTRAVENOUS | Status: DC
  Filled 2016-11-08: qty 250

## 2016-11-08 MED ORDER — TRANEXAMIC ACID 1000 MG/10ML IV SOLN
1.5000 mg/kg/h | INTRAVENOUS | Status: AC
  Administered 2016-11-09: 1.5 mg/kg/h via INTRAVENOUS
  Filled 2016-11-08 (×2): qty 25

## 2016-11-08 MED ORDER — TRANEXAMIC ACID (OHS) BOLUS VIA INFUSION
15.0000 mg/kg | INTRAVENOUS | Status: AC
  Administered 2016-11-09: 1792.5 mg via INTRAVENOUS
  Filled 2016-11-08: qty 1793

## 2016-11-08 MED ORDER — ALPRAZOLAM 0.25 MG PO TABS: 0.2500 mg | ORAL_TABLET | ORAL | Status: DC | PRN

## 2016-11-08 NOTE — Anesthesia Preprocedure Evaluation (Addendum)
Anesthesia Evaluation  Patient identified by MRN, date of birth, ID band Patient awake    Reviewed: Allergy & Precautions, NPO status , Patient's Chart, lab work & pertinent test results  History of Anesthesia Complications Negative for: history of anesthetic complications  Airway Mallampati: II  TM Distance: >3 FB Neck ROM: Full    Dental no notable dental hx. (+) Dental Advisory Given   Pulmonary sleep apnea ,    Pulmonary exam normal        Cardiovascular hypertension, + angina + CAD  Normal cardiovascular exam  Impressions:  - Normal LV size with moderate LV hypertrophy. EF 60-65%. Mildly   dilated RV with normal systolic function. No significant valvular   abnormalities. Mild pulmonary hypertension.    Neuro/Psych TIAnegative psych ROS   GI/Hepatic GERD  ,  Endo/Other  negative endocrine ROSdiabetesMorbid obesity  Renal/GU Renal Insufficiency     Musculoskeletal negative musculoskeletal ROS (+)   Abdominal   Peds  Hematology negative hematology ROS (+)   Anesthesia Other Findings Day of surgery medications reviewed with the patient.  Reproductive/Obstetrics                            Anesthesia Physical Anesthesia Plan  ASA: IV  Anesthesia Plan: General   Post-op Pain Management:    Induction: Intravenous  Airway Management Planned: Oral ETT  Additional Equipment: PA Cath, Ultrasound Guidance Line Placement, 3D TEE and Arterial line  Intra-op Plan:   Post-operative Plan: Post-operative intubation/ventilation  Informed Consent: I have reviewed the patients History and Physical, chart, labs and discussed the procedure including the risks, benefits and alternatives for the proposed anesthesia with the patient or authorized representative who has indicated his/her understanding and acceptance.   Dental advisory given  Plan Discussed with: CRNA, Anesthesiologist and  Surgeon  Anesthesia Plan Comments:        Anesthesia Quick Evaluation

## 2016-11-08 NOTE — Progress Notes (Signed)
6 Days Post-Op Procedure(s) (LRB): Left Heart Cath and Coronary Angiography (N/A) Subjective: No complaints  Objective: Vital signs in last 24 hours: Temp:  [97.4 F (36.3 C)-98 F (36.7 C)] 97.4 F (36.3 C) (01/09 0607) Pulse Rate:  [59-61] 59 (01/09 0607) Cardiac Rhythm: Heart block (01/08 1900) Resp:  [18] 18 (01/09 0607) BP: (137-141)/(53-66) 140/53 (01/09 0607) SpO2:  [98 %] 98 % (01/08 1330) Weight:  [263 lb 8 oz (119.5 kg)] 263 lb 8 oz (119.5 kg) (01/09 0607)  Hemodynamic parameters for last 24 hours:    Intake/Output from previous day: 01/08 0701 - 01/09 0700 In: 720 [P.O.:720] Out: -  Intake/Output this shift: No intake/output data recorded.  General appearance: alert and cooperative Neurologic: intact Heart: regular rate and rhythm Lungs: diminished breath sounds bibasilar  Lab Results:  Recent Labs  11/07/16 0435 11/08/16 0336  WBC 8.3 7.5  HGB 11.9* 11.2*  HCT 36.4* 33.4*  PLT 300 273   BMET:  Recent Labs  11/07/16 0848 11/08/16 0336  NA 137 138  K 4.4 4.3  CL 103 105  CO2 26 26  GLUCOSE 217* 187*  BUN 23* 25*  CREATININE 1.58* 1.55*  CALCIUM 8.9 8.8*    PT/INR: No results for input(s): LABPROT, INR in the last 72 hours. ABG    Component Value Date/Time   PHART 7.397 12/21/2015 1545   HCO3 30.0 (H) 12/21/2015 1545   TCO2 31 12/21/2015 1545   O2SAT 93.0 12/21/2015 1545   CBG (last 3)   Recent Labs  11/07/16 1700 11/07/16 2156 11/08/16 0636  GLUCAP 180* 176* 184*    Assessment/Plan: S/P Procedure(s) (LRB): Left Heart Cath and Coronary Angiography (N/A) - For CABG tomorrow Am  All questions answered He is aware of the risks/ benefits   LOS: 9 days    Melrose Nakayama 11/08/2016

## 2016-11-08 NOTE — Progress Notes (Signed)
Patient Name: Edward [name] Date of Encounter: 11/08/2016  Primary Cardiologist: Dr. Laurena Bering Problem List     Principal Problem:   Atherosclerosis of native coronary artery of native heart with unstable angina pectoris (HCC) Active Problems:   Dyslipidemia, goal LDL below 70   HTN (hypertension)   Essential tremor   Type 2 diabetes mellitus with circulatory disorder, with long-term current use of insulin (HCC)   Chronic diastolic HF (heart failure) (HCC)   Chronic kidney disease, stage III (moderate)   Hypertensive heart disease without heart failure     Subjective   Feeling well. No chest pain, sob or palpitations. For CABG tomorrow.   Inpatient Medications    Scheduled Meds: . amLODipine  10 mg Oral Daily  . aspirin EC  81 mg Oral Daily  . atorvastatin  80 mg Oral q1800  . dicyclomine  10 mg Oral TID AC  . fenofibrate  160 mg Oral Daily  . fluticasone furoate-vilanterol  1 puff Inhalation Daily  . gabapentin  300 mg Oral BID  . insulin aspart  0-20 Units Subcutaneous TID WC  . insulin aspart  5 Units Subcutaneous TID WC  . insulin aspart protamine- aspart  70 Units Subcutaneous BID WC  . pantoprazole  40 mg Oral BID  . potassium chloride  10 mEq Oral BID  . propranolol  40 mg Oral TID  . ranolazine  1,000 mg Oral BID  . sodium chloride flush  3 mL Intravenous Q12H   Continuous Infusions: . heparin 1,300 Units/hr (11/07/16 1930)  . nitroGLYCERIN 15 mcg/min (11/07/16 0000)   PRN Meds: sodium chloride, acetaminophen, albuterol, clonazePAM, magnesium hydroxide, meclizine, nitroGLYCERIN, ondansetron (ZOFRAN) IV, polyvinyl alcohol   Vital Signs    Vitals:   11/07/16 0433 11/07/16 1035 11/07/16 1330 11/08/16 0607  BP: (!) 135/54 137/66 (!) 141/62 (!) 140/53  Pulse: (!) 58 60 61 (!) 59  Resp:   18 18  Temp: 97.8 F (36.6 C)  98 F (36.7 C) 97.4 F (36.3 C)  TempSrc: Oral  Oral Oral  SpO2: 100%  98%   Weight: 264 lb 12.8 oz (120.1 kg)   263 lb 8 oz  (119.5 kg)  Height:        Intake/Output Summary (Last 24 hours) at 11/08/16 0804 Last data filed at 11/07/16 1700  Gross per 24 hour  Intake              480 ml  Output                0 ml  Net              480 ml   Filed Weights   11/06/16 0346 11/07/16 0433 11/08/16 0607  Weight: 265 lb 9.6 oz (120.5 kg) 264 lb 12.8 oz (120.1 kg) 263 lb 8 oz (119.5 kg)    Physical Exam   GEN: Well nourished, well developed, in no acute distress.  HEENT: Grossly normal.  Neck: Supple, no JVD, carotid bruits, or masses. Cardiac: RRR, 2/6 systolic murmurs, rubs, or gallops. No clubbing, cyanosis/ 1+ BL LE edema.  Radials/DP/PT 2+ and equal bilaterally.  Respiratory:  Respirations regular and unlabored, clear to auscultation bilaterally. GI: Soft, nontender, nondistended, BS + x 4. MS: no deformity or atrophy. Skin: warm and dry, no rash. Neuro:  Strength and sensation are intact. Psych: AAOx3.  Normal affect.  Labs    CBC  Recent Labs  11/07/16 0435 11/08/16 0336  WBC 8.3 7.5  HGB 11.9* 11.2*  HCT 36.4* 33.4*  MCV 85.4 84.6  PLT 300 [id number]   Basic Metabolic Panel  Recent Labs  11/07/16 0848 11/08/16 0336  NA 137 138  K 4.4 4.3  CL 103 105  CO2 26 26  GLUCOSE 217* 187*  BUN 23* 25*  CREATININE 1.58* 1.55*  CALCIUM 8.9 8.8*   Liver Function Tests No results for input(s): AST, ALT, ALKPHOS, BILITOT, PROT, ALBUMIN in the last 72 hours. No results for input(s): LIPASE, AMYLASE in the last 72 hours. Cardiac Enzymes No results for input(s): CKTOTAL, CKMB, CKMBINDEX, TROPONINI in the last 72 hours. BNP Invalid input(s): POCBNP D-Dimer No results for input(s): DDIMER in the last 72 hours. Hemoglobin A1C No results for input(s): HGBA1C in the last 72 hours. Fasting Lipid Panel No results for input(s): CHOL, HDL, LDLCALC, TRIG, CHOLHDL, LDLDIRECT in the last 72 hours. Thyroid Function Tests No results for input(s): TSH, T4TOTAL, T3FREE, THYROIDAB in the last 72  hours.  Invalid input(s): FREET3  Telemetry    Sinus rhythm with PVCS - Personally Reviewed  ECG    N/A - Personally Reviewed  Radiology    No results found.  Cardiac Studies   Cardiac catheterization 11/02/16: Conclusions: 1. Significant LMCA (60%) and ostial LCx (85%) stenoses.  Eccentric mid LMCA stenosis appears somewhat worse than prior angiogram in 12/2015.  Lesion was borderline significant at that time by IVUS. 2. Patent mid LAD stent with mild to moderate proximal LAD, LCx/OM, and RCA disease. 3. Mildly elevated left ventricular filling pressure.   Recommendations: 1. Cardiac surgery consultation for CABG, given progression of LMCA disease. 2. Discontinue clopidogrel in anticipation of possible CABG. 3. Restart heparin infusion 4 hours after TR band removal. 4. Continue aggressive secondary prevention.  Findings and recommendations were discussed with Dr. Tamala Julian.       Echo 11/02/16 LV EF: 60% -   65%  ------------------------------------------------------------------- Indications:      Chest pain 786.51.  ------------------------------------------------------------------- History:   PMH:   Dyspnea.  Congestive heart failure.  Transient ischemic attack.  Risk factors:  Diabetes mellitus. Dyslipidemia.   ------------------------------------------------------------------- Study Conclusions  - Left ventricle: The cavity size was normal. Wall thickness was   increased in a pattern of moderate LVH. Systolic function was   normal. The estimated ejection fraction was in the range of 60%   to 65%. Wall motion was normal; there were no regional wall   motion abnormalities. Doppler parameters are consistent with   abnormal left ventricular relaxation (grade 1 diastolic   dysfunction). - Aortic valve: There was no stenosis. - Mitral valve: Mildly calcified annulus. There was trivial   regurgitation. - Left atrium: The atrium was mildly dilated. - Right  ventricle: The cavity size was mildly dilated. Systolic   function was normal. - Right atrium: The atrium was mildly dilated. - Tricuspid valve: Peak RV-RA gradient (S): 34 mm Hg. - Pulmonary arteries: PA peak pressure: 37 mm Hg (S). - Inferior vena cava: The vessel was normal in size. The   respirophasic diameter changes were in the normal range (>= 50%),   consistent with normal central venous pressure.  Impressions:  - Normal LV size with moderate LV hypertrophy. EF 60-65%. Mildly   dilated RV with normal systolic function. No significant valvular   abnormalities. Mild pulmonary hypertension.  Patient Profile     Edward [name] is a very pleasant 72M with CAD, s/p PCI, hypertensive heart disease, hyperlipidemia, diabetes, and CKD III here with angina.  Assessment & Plan    1. Unable angina - Cath showed progressive worsening of left main disease. For CABG tomorrow after plavix washout. He is chest pain free.  - Continue IV nitro, Iv heparin, statin, ASA and renexa.   2. Chronic diastolic CHF - Echo 123XX123 showed Ef of 6065%, moderate LVH, grade 1 DD. Mildly dilated RV with normal systolic function. No significant valvular abnormalities. Mild pulmonary hypertension. - Off lasix due to worsen kidney function. No dyspnea. Mild lower extremity edema.   3. Acute on CKD, stage III - Scr stable today.    Signed, Leanor Kail, PA  11/08/2016, 8:04 AM

## 2016-11-08 NOTE — Progress Notes (Signed)
ANTICOAGULATION CONSULT NOTE - Follow Up Consult  Pharmacy Consult for Heparin Indication: chest pain/ACS  No Known Allergies  Patient Measurements: Height: 5\' 8"  (172.7 cm) Weight: 263 lb 8 oz (119.5 kg) IBW/kg (Calculated) : 68.4 Heparin Dosing Weight: 97kg  Vital Signs: Temp: 97.4 F (36.3 C) (01/09 0607) Temp Source: Oral (01/09 0607) BP: 140/53 (01/09 0607) Pulse Rate: 59 (01/09 0607)  Assessment: 75yom continues on heparin gtt for ACS/CP, s/p cath found to have worsening LMCA stenosis > needs CABG, heparin resumed post-cath, CABG planned 1/10. Last HL remains stable at 0.61. Hgb 11.2, plts wnl. No s/s of bleed.  Goal of Therapy:  Heparin level 0.3-0.7 units/ml Monitor platelets by anticoagulation protocol: Yes   Plan:  Continue heparin gtt at 1,300 units/hr Monitor daily heparin level, CBC, s/s of bleed CABG for 1/10 (last dose Plavix 1/3)  Elenor Quinones, PharmD, [facility] Clinical Pharmacist Pager [phone] 11/08/2016 8:13 AM

## 2016-11-09 ENCOUNTER — Encounter (HOSPITAL_COMMUNITY): Payer: Self-pay | Admitting: Anesthesiology

## 2016-11-09 ENCOUNTER — Inpatient Hospital Stay (HOSPITAL_COMMUNITY): Payer: Medicare Other

## 2016-11-09 ENCOUNTER — Inpatient Hospital Stay (HOSPITAL_COMMUNITY): Payer: Medicare Other | Admitting: Anesthesiology

## 2016-11-09 ENCOUNTER — Encounter (HOSPITAL_COMMUNITY)
Admission: EM | Disposition: A | Discharge status: Skilled Nursing Facility | Payer: Self-pay | Source: Home / Self Care | Attending: Surgery

## 2016-11-09 HISTORY — PX: TEE WITHOUT CARDIOVERSION: SHX5443

## 2016-11-09 HISTORY — PX: CORONARY ARTERY BYPASS GRAFT: SHX141

## 2016-11-09 HISTORY — PX: ENDOVEIN HARVEST OF GREATER SAPHENOUS VEIN: SHX5059

## 2016-11-09 LAB — POCT I-STAT 3, ART BLOOD GAS (G3+)
ACID-BASE DEFICIT: 2 mmol/L (ref 0.0–2.0)
ACID-BASE EXCESS: 2 mmol/L (ref 0.0–2.0)
Acid-base deficit: 2 mmol/L (ref 0.0–2.0)
Acid-base deficit: 3 mmol/L — ABNORMAL HIGH (ref 0.0–2.0)
BICARBONATE: 26.7 mmol/L (ref 20.0–28.0)
BICARBONATE: 23.3 mmol/L (ref 20.0–28.0)
BICARBONATE: 22.7 mmol/L (ref 20.0–28.0)
BICARBONATE: 24.2 mmol/L (ref 20.0–28.0)
O2 Saturation: 100 %
O2 Saturation: 99 %
O2 Saturation: 98 %
O2 Saturation: 97 %
PCO2 ART: 40.9 mmHg (ref 32.0–48.0)
PH ART: 7.441 (ref 7.350–7.450)
PO2 ART: 114 mmHg — AB (ref 83.0–108.0)
PO2 ART: 104 mmHg (ref 83.0–108.0)
Patient temperature: 36
Patient temperature: 37.4
Patient temperature: 37.6
TCO2: 28 mmol/L (ref 0–100)
TCO2: 25 mmol/L (ref 0–100)
TCO2: 24 mmol/L (ref 0–100)
TCO2: 26 mmol/L (ref 0–100)
pCO2 arterial: 39.2 mmHg (ref 32.0–48.0)
pCO2 arterial: 41.2 mmHg (ref 32.0–48.0)
pCO2 arterial: 45.1 mmHg (ref 32.0–48.0)
pH, Arterial: 7.355 (ref 7.350–7.450)
pH, Arterial: 7.354 (ref 7.350–7.450)
pH, Arterial: 7.341 — ABNORMAL LOW (ref 7.350–7.450)
pO2, Arterial: 396 mmHg — ABNORMAL HIGH (ref 83.0–108.0)
pO2, Arterial: 128 mmHg — ABNORMAL HIGH (ref 83.0–108.0)

## 2016-11-09 LAB — CBC
HEMATOCRIT: 34.3 % — AB (ref 39.0–52.0)
HEMATOCRIT: 31.6 % — AB (ref 39.0–52.0)
HEMATOCRIT: 33.2 % — AB (ref 39.0–52.0)
Hemoglobin: 11.8 g/dL — ABNORMAL LOW (ref 13.0–17.0)
Hemoglobin: 10.7 g/dL — ABNORMAL LOW (ref 13.0–17.0)
Hemoglobin: 11.3 g/dL — ABNORMAL LOW (ref 13.0–17.0)
MCH: 29 pg (ref 26.0–34.0)
MCH: 28.6 pg (ref 26.0–34.0)
MCH: 28.5 pg (ref 26.0–34.0)
MCHC: 34.4 g/dL (ref 30.0–36.0)
MCHC: 33.9 g/dL (ref 30.0–36.0)
MCHC: 34 g/dL (ref 30.0–36.0)
MCV: 84.3 fL (ref 78.0–100.0)
MCV: 84.5 fL (ref 78.0–100.0)
MCV: 83.8 fL (ref 78.0–100.0)
PLATELETS: 190 10*3/uL (ref 150–400)
Platelets: 292 10*3/uL (ref 150–400)
Platelets: 177 10*3/uL (ref 150–400)
RBC: 4.07 MIL/uL — ABNORMAL LOW (ref 4.22–5.81)
RBC: 3.74 MIL/uL — ABNORMAL LOW (ref 4.22–5.81)
RBC: 3.96 MIL/uL — AB (ref 4.22–5.81)
RDW: 13.4 % (ref 11.5–15.5)
RDW: 13.7 % (ref 11.5–15.5)
RDW: 13.4 % (ref 11.5–15.5)
WBC: 7.1 10*3/uL (ref 4.0–10.5)
WBC: 7.4 10*3/uL (ref 4.0–10.5)
WBC: 9.3 10*3/uL (ref 4.0–10.5)

## 2016-11-09 LAB — BASIC METABOLIC PANEL
Anion gap: 6 (ref 5–15)
BUN: 22 mg/dL — AB (ref 6–20)
CHLORIDE: 107 mmol/L (ref 101–111)
CO2: 24 mmol/L (ref 22–32)
Calcium: 9 mg/dL (ref 8.9–10.3)
Creatinine, Ser: 1.54 mg/dL — ABNORMAL HIGH (ref 0.61–1.24)
GFR calc non Af Amer: 43 mL/min — ABNORMAL LOW (ref >60)
GFR, EST AFRICAN AMERICAN: 50 mL/min — AB (ref >60)
Glucose, Bld: 81 mg/dL (ref 65–99)
POTASSIUM: 4.1 mmol/L (ref 3.5–5.1)
Sodium: 137 mmol/L (ref 135–145)

## 2016-11-09 LAB — GLUCOSE, CAPILLARY
GLUCOSE-CAPILLARY: 137 mg/dL — AB (ref 65–99)
GLUCOSE-CAPILLARY: 146 mg/dL — AB (ref 65–99)
GLUCOSE-CAPILLARY: 137 mg/dL — AB (ref 65–99)
GLUCOSE-CAPILLARY: 147 mg/dL — AB (ref 65–99)
GLUCOSE-CAPILLARY: 110 mg/dL — AB (ref 65–99)
Glucose-Capillary: 58 mg/dL — ABNORMAL LOW (ref 65–99)
Glucose-Capillary: 98 mg/dL (ref 65–99)
Glucose-Capillary: 87 mg/dL (ref 65–99)
Glucose-Capillary: 133 mg/dL — ABNORMAL HIGH (ref 65–99)
Glucose-Capillary: 134 mg/dL — ABNORMAL HIGH (ref 65–99)

## 2016-11-09 LAB — POCT I-STAT, CHEM 8
BUN: 21 mg/dL — ABNORMAL HIGH (ref 6–20)
BUN: 18 mg/dL (ref 6–20)
BUN: 18 mg/dL (ref 6–20)
BUN: 18 mg/dL (ref 6–20)
BUN: 16 mg/dL (ref 6–20)
CALCIUM ION: 1.13 mmol/L — AB (ref 1.15–1.40)
CHLORIDE: 103 mmol/L (ref 101–111)
CHLORIDE: 101 mmol/L (ref 101–111)
CREATININE: 1.4 mg/dL — AB (ref 0.61–1.24)
CREATININE: 1.1 mg/dL (ref 0.61–1.24)
CREATININE: 1.2 mg/dL (ref 0.61–1.24)
CREATININE: 1.2 mg/dL (ref 0.61–1.24)
Calcium, Ion: 1.29 mmol/L (ref 1.15–1.40)
Calcium, Ion: 1.11 mmol/L — ABNORMAL LOW (ref 1.15–1.40)
Calcium, Ion: 1.14 mmol/L — ABNORMAL LOW (ref 1.15–1.40)
Calcium, Ion: 1.17 mmol/L (ref 1.15–1.40)
Chloride: 101 mmol/L (ref 101–111)
Chloride: 101 mmol/L (ref 101–111)
Chloride: 103 mmol/L (ref 101–111)
Creatinine, Ser: 1.2 mg/dL (ref 0.61–1.24)
GLUCOSE: 99 mg/dL (ref 65–99)
GLUCOSE: 99 mg/dL (ref 65–99)
GLUCOSE: 123 mg/dL — AB (ref 65–99)
Glucose, Bld: 114 mg/dL — ABNORMAL HIGH (ref 65–99)
Glucose, Bld: 149 mg/dL — ABNORMAL HIGH (ref 65–99)
HCT: 33 % — ABNORMAL LOW (ref 39.0–52.0)
HCT: 27 % — ABNORMAL LOW (ref 39.0–52.0)
HCT: 29 % — ABNORMAL LOW (ref 39.0–52.0)
HCT: 28 % — ABNORMAL LOW (ref 39.0–52.0)
HEMATOCRIT: 34 % — AB (ref 39.0–52.0)
HEMOGLOBIN: 9.9 g/dL — AB (ref 13.0–17.0)
HEMOGLOBIN: 11.6 g/dL — AB (ref 13.0–17.0)
Hemoglobin: 11.2 g/dL — ABNORMAL LOW (ref 13.0–17.0)
Hemoglobin: 9.2 g/dL — ABNORMAL LOW (ref 13.0–17.0)
Hemoglobin: 9.5 g/dL — ABNORMAL LOW (ref 13.0–17.0)
POTASSIUM: 4.1 mmol/L (ref 3.5–5.1)
POTASSIUM: 4.2 mmol/L (ref 3.5–5.1)
POTASSIUM: 4.2 mmol/L (ref 3.5–5.1)
Potassium: 4.8 mmol/L (ref 3.5–5.1)
Potassium: 4.4 mmol/L (ref 3.5–5.1)
SODIUM: 138 mmol/L (ref 135–145)
Sodium: 140 mmol/L (ref 135–145)
Sodium: 138 mmol/L (ref 135–145)
Sodium: 139 mmol/L (ref 135–145)
Sodium: 139 mmol/L (ref 135–145)
TCO2: 29 mmol/L (ref 0–100)
TCO2: 27 mmol/L (ref 0–100)
TCO2: 27 mmol/L (ref 0–100)
TCO2: 27 mmol/L (ref 0–100)
TCO2: 23 mmol/L (ref 0–100)

## 2016-11-09 LAB — POCT I-STAT 4, (NA,K, GLUC, HGB,HCT)
Glucose, Bld: 137 mg/dL — ABNORMAL HIGH (ref 65–99)
HCT: 31 % — ABNORMAL LOW (ref 39.0–52.0)
Hemoglobin: 10.5 g/dL — ABNORMAL LOW (ref 13.0–17.0)
Potassium: 4.1 mmol/L (ref 3.5–5.1)
Sodium: 139 mmol/L (ref 135–145)

## 2016-11-09 LAB — CREATININE, SERUM
Creatinine, Ser: 1.31 mg/dL — ABNORMAL HIGH (ref 0.61–1.24)
GFR calc non Af Amer: 52 mL/min — ABNORMAL LOW (ref >60)

## 2016-11-09 LAB — HEMOGLOBIN AND HEMATOCRIT, BLOOD
HCT: 26 % — ABNORMAL LOW (ref 39.0–52.0)
Hemoglobin: 9 g/dL — ABNORMAL LOW (ref 13.0–17.0)

## 2016-11-09 LAB — PROTIME-INR
INR: 1.35
Prothrombin Time: 16.7 seconds — ABNORMAL HIGH (ref 11.4–15.2)

## 2016-11-09 LAB — APTT: aPTT: 38 seconds — ABNORMAL HIGH (ref 24–36)

## 2016-11-09 LAB — TYPE AND SCREEN
ABO/RH(D): O POS
Antibody Screen: NEGATIVE

## 2016-11-09 LAB — HEPARIN LEVEL (UNFRACTIONATED): Heparin Unfractionated: 0.46 IU/mL (ref 0.30–0.70)

## 2016-11-09 LAB — PLATELET COUNT: PLATELETS: 197 10*3/uL (ref 150–400)

## 2016-11-09 LAB — MAGNESIUM: Magnesium: 2.4 mg/dL (ref 1.7–2.4)

## 2016-11-09 SURGERY — CORONARY ARTERY BYPASS GRAFTING (CABG)
Anesthesia: General | Site: Leg Upper

## 2016-11-09 MED ORDER — HEPARIN SODIUM (PORCINE) 1000 UNIT/ML IJ SOLN
INTRAMUSCULAR | Status: AC
  Filled 2016-11-09: qty 1

## 2016-11-09 MED ORDER — SODIUM CHLORIDE 0.9 % IV SOLN
INTRAVENOUS | Status: DC
  Administered 2016-11-09: 13:00:00 via INTRAVENOUS

## 2016-11-09 MED ORDER — MORPHINE SULFATE (PF) 2 MG/ML IV SOLN
1.0000 mg | INTRAVENOUS | Status: AC | PRN
  Administered 2016-11-09: 1 mg via INTRAVENOUS
  Administered 2016-11-09: 2 mg via INTRAVENOUS
  Filled 2016-11-09 (×2): qty 1

## 2016-11-09 MED ORDER — BISACODYL 10 MG RE SUPP: 10.0000 mg | Freq: Every day | RECTAL | Status: DC

## 2016-11-09 MED ORDER — PROTAMINE SULFATE 10 MG/ML IV SOLN
INTRAVENOUS | Status: AC
  Filled 2016-11-09: qty 50

## 2016-11-09 MED ORDER — ALBUMIN HUMAN 5 % IV SOLN
250.0000 mL | INTRAVENOUS | Status: AC | PRN
  Administered 2016-11-09 (×3): 250 mL via INTRAVENOUS
  Filled 2016-11-09: qty 250

## 2016-11-09 MED ORDER — BISACODYL 5 MG PO TBEC
10.0000 mg | DELAYED_RELEASE_TABLET | Freq: Every day | ORAL | Status: DC
  Administered 2016-11-10 – 2016-11-15 (×6): 10 mg via ORAL
  Filled 2016-11-09 (×6): qty 2

## 2016-11-09 MED ORDER — MIDAZOLAM HCL 10 MG/2ML IJ SOLN
INTRAMUSCULAR | Status: AC
  Filled 2016-11-09: qty 2

## 2016-11-09 MED ORDER — DEXMEDETOMIDINE HCL IN NACL 200 MCG/50ML IV SOLN
0.0000 ug/kg/h | INTRAVENOUS | Status: DC
  Filled 2016-11-09: qty 50

## 2016-11-09 MED ORDER — MAGNESIUM SULFATE 4 GM/100ML IV SOLN
4.0000 g | Freq: Once | INTRAVENOUS | Status: AC
  Administered 2016-11-09: 4 g via INTRAVENOUS
  Filled 2016-11-09: qty 100

## 2016-11-09 MED ORDER — PHENYLEPHRINE HCL 10 MG/ML IJ SOLN
0.0000 ug/min | INTRAVENOUS | Status: DC
  Filled 2016-11-09: qty 2

## 2016-11-09 MED ORDER — ACETAMINOPHEN 160 MG/5ML PO SOLN: 650.0000 mg | Freq: Once | ORAL | Status: AC

## 2016-11-09 MED ORDER — FENTANYL CITRATE (PF) 250 MCG/5ML IJ SOLN
INTRAMUSCULAR | Status: AC
  Filled 2016-11-09: qty 30

## 2016-11-09 MED ORDER — SODIUM CHLORIDE 0.9 % IV SOLN: 250.0000 mL | INTRAVENOUS | Status: DC

## 2016-11-09 MED ORDER — PANTOPRAZOLE SODIUM 40 MG PO TBEC
40.0000 mg | DELAYED_RELEASE_TABLET | Freq: Every day | ORAL | Status: DC
  Administered 2016-11-11 – 2016-11-15 (×5): 40 mg via ORAL
  Filled 2016-11-09 (×5): qty 1

## 2016-11-09 MED ORDER — PROTAMINE SULFATE 10 MG/ML IV SOLN
INTRAVENOUS | Status: DC | PRN
  Administered 2016-11-09: 350 mg via INTRAVENOUS

## 2016-11-09 MED ORDER — HEMOSTATIC AGENTS (NO CHARGE) OPTIME
TOPICAL | Status: DC | PRN
  Administered 2016-11-09: 1 via TOPICAL

## 2016-11-09 MED ORDER — MIDAZOLAM HCL 5 MG/5ML IJ SOLN
INTRAMUSCULAR | Status: DC | PRN
  Administered 2016-11-09: 1 mg via INTRAVENOUS
  Administered 2016-11-09: 2 mg via INTRAVENOUS
  Administered 2016-11-09 (×2): 1 mg via INTRAVENOUS
  Administered 2016-11-09: 2 mg via INTRAVENOUS

## 2016-11-09 MED ORDER — DOCUSATE SODIUM 100 MG PO CAPS
200.0000 mg | ORAL_CAPSULE | Freq: Every day | ORAL | Status: DC
  Administered 2016-11-10 – 2016-11-15 (×6): 200 mg via ORAL
  Filled 2016-11-09 (×6): qty 2

## 2016-11-09 MED ORDER — FAMOTIDINE IN NACL 20-0.9 MG/50ML-% IV SOLN
20.0000 mg | Freq: Two times a day (BID) | INTRAVENOUS | Status: AC
  Administered 2016-11-09: 20 mg via INTRAVENOUS

## 2016-11-09 MED ORDER — INSULIN REGULAR BOLUS VIA INFUSION
0.0000 [IU] | Freq: Three times a day (TID) | INTRAVENOUS | Status: DC
  Filled 2016-11-09: qty 10

## 2016-11-09 MED ORDER — ARTIFICIAL TEARS OP OINT
TOPICAL_OINTMENT | OPHTHALMIC | Status: AC
  Filled 2016-11-09: qty 7

## 2016-11-09 MED ORDER — ARTIFICIAL TEARS OP OINT
TOPICAL_OINTMENT | OPHTHALMIC | Status: DC | PRN
  Administered 2016-11-09: 1 via OPHTHALMIC

## 2016-11-09 MED ORDER — SODIUM CHLORIDE 0.9% FLUSH: 3.0000 mL | INTRAVENOUS | Status: DC | PRN

## 2016-11-09 MED ORDER — SODIUM CHLORIDE 0.9% FLUSH
3.0000 mL | Freq: Two times a day (BID) | INTRAVENOUS | Status: DC
  Administered 2016-11-10 (×2): 3 mL via INTRAVENOUS

## 2016-11-09 MED ORDER — HEMOSTATIC AGENTS (NO CHARGE) OPTIME
TOPICAL | Status: DC | PRN
  Administered 2016-11-09: 3 via TOPICAL

## 2016-11-09 MED ORDER — OXYCODONE HCL 5 MG PO TABS
5.0000 mg | ORAL_TABLET | ORAL | Status: DC | PRN
  Administered 2016-11-09: 10 mg via ORAL
  Administered 2016-11-10 – 2016-11-11 (×5): 5 mg via ORAL
  Administered 2016-11-11: 10 mg via ORAL
  Administered 2016-11-11 (×2): 5 mg via ORAL
  Administered 2016-11-12 – 2016-11-15 (×13): 10 mg via ORAL
  Filled 2016-11-09: qty 2
  Filled 2016-11-09 (×2): qty 1
  Filled 2016-11-09: qty 2
  Filled 2016-11-09: qty 1
  Filled 2016-11-09 (×2): qty 2
  Filled 2016-11-09: qty 1
  Filled 2016-11-09: qty 2
  Filled 2016-11-09: qty 1
  Filled 2016-11-09: qty 2
  Filled 2016-11-09: qty 1
  Filled 2016-11-09 (×6): qty 2
  Filled 2016-11-09: qty 1
  Filled 2016-11-09 (×4): qty 2

## 2016-11-09 MED ORDER — CHLORHEXIDINE GLUCONATE 0.12% ORAL RINSE (MEDLINE KIT)
15.0000 mL | Freq: Two times a day (BID) | OROMUCOSAL | Status: DC
  Administered 2016-11-09: 15 mL via OROMUCOSAL

## 2016-11-09 MED ORDER — DEXTROSE 5 % IV SOLN
1.5000 g | Freq: Two times a day (BID) | INTRAVENOUS | Status: AC
  Administered 2016-11-09 – 2016-11-11 (×4): 1.5 g via INTRAVENOUS
  Filled 2016-11-09 (×4): qty 1.5

## 2016-11-09 MED ORDER — SODIUM CHLORIDE 0.9 % IV SOLN
INTRAVENOUS | Status: DC
  Administered 2016-11-09: 1.5 [IU]/h via INTRAVENOUS
  Filled 2016-11-09 (×2): qty 2.5

## 2016-11-09 MED ORDER — LIDOCAINE HCL (CARDIAC) 20 MG/ML IV SOLN
INTRAVENOUS | Status: DC | PRN
  Administered 2016-11-09: 100 mg via INTRAVENOUS

## 2016-11-09 MED ORDER — LACTATED RINGERS IV SOLN
INTRAVENOUS | Status: DC
  Administered 2016-11-09 – 2016-11-10 (×2): via INTRAVENOUS

## 2016-11-09 MED ORDER — LACTATED RINGERS IV SOLN
INTRAVENOUS | Status: DC | PRN
  Administered 2016-11-09 (×2): via INTRAVENOUS

## 2016-11-09 MED ORDER — DEXTROSE 50 % IV SOLN
INTRAVENOUS | Status: AC
  Administered 2016-11-09: 25 mL
  Filled 2016-11-09: qty 50

## 2016-11-09 MED ORDER — ACETAMINOPHEN 160 MG/5ML PO SOLN
1000.0000 mg | Freq: Four times a day (QID) | ORAL | Status: AC
  Administered 2016-11-09: 1000 mg
  Filled 2016-11-09: qty 40.6

## 2016-11-09 MED ORDER — ONDANSETRON HCL 4 MG/2ML IJ SOLN
4.0000 mg | Freq: Four times a day (QID) | INTRAMUSCULAR | Status: DC | PRN
  Administered 2016-11-09: 4 mg via INTRAVENOUS
  Filled 2016-11-09: qty 2

## 2016-11-09 MED ORDER — ASPIRIN 81 MG PO CHEW: 324.0000 mg | CHEWABLE_TABLET | Freq: Every day | ORAL | Status: DC

## 2016-11-09 MED ORDER — METOPROLOL TARTRATE 25 MG/10 ML ORAL SUSPENSION
12.5000 mg | Freq: Two times a day (BID) | ORAL | Status: DC
  Administered 2016-11-10 (×2): 12.5 mg
  Filled 2016-11-09 (×2): qty 5

## 2016-11-09 MED ORDER — METOPROLOL TARTRATE 5 MG/5ML IV SOLN: 2.5000 mg | INTRAVENOUS | Status: DC | PRN

## 2016-11-09 MED ORDER — TRAMADOL HCL 50 MG PO TABS: 50.0000 mg | ORAL_TABLET | ORAL | Status: DC | PRN

## 2016-11-09 MED ORDER — SODIUM CHLORIDE 0.9 % IV SOLN
30.0000 meq | Freq: Once | INTRAVENOUS | Status: DC
  Filled 2016-11-09: qty 15

## 2016-11-09 MED ORDER — HEPARIN SODIUM (PORCINE) 1000 UNIT/ML IJ SOLN
INTRAMUSCULAR | Status: DC | PRN
  Administered 2016-11-09: 2000 [IU] via INTRAVENOUS
  Administered 2016-11-09: 48000 [IU] via INTRAVENOUS

## 2016-11-09 MED ORDER — FENTANYL CITRATE (PF) 250 MCG/5ML IJ SOLN
INTRAMUSCULAR | Status: DC | PRN
  Administered 2016-11-09: 400 ug via INTRAVENOUS
  Administered 2016-11-09 (×4): 250 ug via INTRAVENOUS
  Administered 2016-11-09: 100 ug via INTRAVENOUS

## 2016-11-09 MED ORDER — MIDAZOLAM HCL 2 MG/2ML IJ SOLN: 2.0000 mg | INTRAMUSCULAR | Status: DC | PRN

## 2016-11-09 MED ORDER — ACETAMINOPHEN 650 MG RE SUPP
650.0000 mg | Freq: Once | RECTAL | Status: AC
  Administered 2016-11-09: 650 mg via RECTAL

## 2016-11-09 MED ORDER — DEXTROSE 5 % IV SOLN
INTRAVENOUS | Status: DC | PRN
  Administered 2016-11-09: 09:00:00 via INTRAVENOUS

## 2016-11-09 MED ORDER — ACETAMINOPHEN 500 MG PO TABS
1000.0000 mg | ORAL_TABLET | Freq: Four times a day (QID) | ORAL | Status: AC
  Administered 2016-11-10 – 2016-11-14 (×19): 1000 mg via ORAL
  Filled 2016-11-09 (×21): qty 2

## 2016-11-09 MED ORDER — ROCURONIUM BROMIDE 50 MG/5ML IV SOSY
PREFILLED_SYRINGE | INTRAVENOUS | Status: AC
  Filled 2016-11-09: qty 10

## 2016-11-09 MED ORDER — ROCURONIUM BROMIDE 10 MG/ML (PF) SYRINGE
PREFILLED_SYRINGE | INTRAVENOUS | Status: DC | PRN
  Administered 2016-11-09: 100 mg via INTRAVENOUS

## 2016-11-09 MED ORDER — LIDOCAINE 2% (20 MG/ML) 5 ML SYRINGE
INTRAMUSCULAR | Status: AC
  Filled 2016-11-09: qty 5

## 2016-11-09 MED ORDER — 0.9 % SODIUM CHLORIDE (POUR BTL) OPTIME
TOPICAL | Status: DC | PRN
  Administered 2016-11-09: 1000 mL

## 2016-11-09 MED ORDER — ASPIRIN EC 325 MG PO TBEC
325.0000 mg | DELAYED_RELEASE_TABLET | Freq: Every day | ORAL | Status: DC
  Administered 2016-11-10 – 2016-11-14 (×5): 325 mg via ORAL
  Filled 2016-11-09 (×5): qty 1

## 2016-11-09 MED ORDER — VANCOMYCIN HCL IN DEXTROSE 1-5 GM/200ML-% IV SOLN
1000.0000 mg | Freq: Once | INTRAVENOUS | Status: AC
  Administered 2016-11-09: 1000 mg via INTRAVENOUS
  Filled 2016-11-09: qty 200

## 2016-11-09 MED ORDER — CHLORHEXIDINE GLUCONATE 0.12 % MT SOLN
15.0000 mL | OROMUCOSAL | Status: AC
  Administered 2016-11-09: 15 mL via OROMUCOSAL

## 2016-11-09 MED ORDER — PROPOFOL 10 MG/ML IV BOLUS
INTRAVENOUS | Status: DC | PRN
  Administered 2016-11-09: 40 mg via INTRAVENOUS
  Administered 2016-11-09: 130 mg via INTRAVENOUS

## 2016-11-09 MED ORDER — VECURONIUM BROMIDE 10 MG IV SOLR
INTRAVENOUS | Status: AC
  Filled 2016-11-09: qty 10

## 2016-11-09 MED ORDER — MORPHINE SULFATE (PF) 2 MG/ML IV SOLN
2.0000 mg | INTRAVENOUS | Status: DC | PRN
  Administered 2016-11-09: 2 mg via INTRAVENOUS
  Filled 2016-11-09: qty 1

## 2016-11-09 MED ORDER — HEPARIN SODIUM (PORCINE) 1000 UNIT/ML IJ SOLN
INTRAMUSCULAR | Status: AC
  Filled 2016-11-09: qty 2

## 2016-11-09 MED ORDER — ORAL CARE MOUTH RINSE
15.0000 mL | Freq: Four times a day (QID) | OROMUCOSAL | Status: DC
  Administered 2016-11-09 – 2016-11-10 (×3): 15 mL via OROMUCOSAL

## 2016-11-09 MED ORDER — LACTATED RINGERS IV SOLN
500.0000 mL | Freq: Once | INTRAVENOUS | Status: DC | PRN
Start: 2016-11-09 — End: 2016-11-11

## 2016-11-09 MED ORDER — SODIUM CHLORIDE 0.45 % IV SOLN: INTRAVENOUS | Status: DC | PRN

## 2016-11-09 MED ORDER — VECURONIUM BROMIDE 10 MG IV SOLR
INTRAVENOUS | Status: DC | PRN
  Administered 2016-11-09 (×2): 5 mg via INTRAVENOUS

## 2016-11-09 MED ORDER — LACTATED RINGERS IV SOLN
INTRAVENOUS | Status: DC
  Administered 2016-11-09: 13:00:00 via INTRAVENOUS

## 2016-11-09 MED ORDER — NITROGLYCERIN IN D5W 200-5 MCG/ML-% IV SOLN: 0.0000 ug/min | INTRAVENOUS | Status: DC

## 2016-11-09 MED ORDER — METOPROLOL TARTRATE 12.5 MG HALF TABLET: 12.5000 mg | ORAL_TABLET | Freq: Two times a day (BID) | ORAL | Status: DC

## 2016-11-09 MED FILL — Potassium Chloride Inj 2 mEq/ML: INTRAVENOUS | Qty: 40 | Status: AC

## 2016-11-09 MED FILL — Heparin Sodium (Porcine) Inj 1000 Unit/ML: INTRAMUSCULAR | Qty: 30 | Status: AC

## 2016-11-09 MED FILL — Magnesium Sulfate Inj 50%: INTRAMUSCULAR | Qty: 10 | Status: AC

## 2016-11-09 SURGICAL SUPPLY — 88 items
BAG DECANTER FOR FLEXI CONT (MISCELLANEOUS) ×5 IMPLANT
BANDAGE ACE 4X5 VEL STRL LF (GAUZE/BANDAGES/DRESSINGS) ×5 IMPLANT
BANDAGE ACE 6X5 VEL STRL LF (GAUZE/BANDAGES/DRESSINGS) ×5 IMPLANT
BASKET HEART  (ORDER IN 25'S) (MISCELLANEOUS) ×1
BASKET HEART (ORDER IN 25'S) (MISCELLANEOUS) ×1
BASKET HEART (ORDER IN 25S) (MISCELLANEOUS) ×3 IMPLANT
BLADE 11 SAFETY STRL DISP (BLADE) ×5 IMPLANT
BLADE STERNUM SYSTEM 6 (BLADE) ×10 IMPLANT
BNDG GAUZE ELAST 4 BULKY (GAUZE/BANDAGES/DRESSINGS) ×5 IMPLANT
CANISTER SUCTION 2500CC (MISCELLANEOUS) ×5 IMPLANT
CANNULA EZ GLIDE AORTIC 21FR (CANNULA) ×5 IMPLANT
CATH CPB KIT HENDRICKSON (MISCELLANEOUS) ×5 IMPLANT
CATH ROBINSON RED A/P 18FR (CATHETERS) ×5 IMPLANT
CATH THORACIC 36FR (CATHETERS) ×5 IMPLANT
CATH THORACIC 36FR RT ANG (CATHETERS) ×5 IMPLANT
CLIP TI MEDIUM 24 (CLIP) ×5 IMPLANT
CLIP TI WIDE RED SMALL 24 (CLIP) ×5 IMPLANT
CRADLE DONUT ADULT HEAD (MISCELLANEOUS) ×5 IMPLANT
DERMABOND ADVANCED (GAUZE/BANDAGES/DRESSINGS) ×4
DERMABOND ADVANCED .7 DNX12 (GAUZE/BANDAGES/DRESSINGS) ×6 IMPLANT
DRAPE CARDIOVASCULAR INCISE (DRAPES) ×2
DRAPE SLUSH/WARMER DISC (DRAPES) ×5 IMPLANT
DRAPE SRG 135X102X78XABS (DRAPES) ×3 IMPLANT
DRSG COVADERM 4X14 (GAUZE/BANDAGES/DRESSINGS) ×5 IMPLANT
ELECT REM PT RETURN 9FT ADLT (ELECTROSURGICAL) ×10
ELECTRODE REM PT RTRN 9FT ADLT (ELECTROSURGICAL) ×6 IMPLANT
FELT TEFLON 1X6 (MISCELLANEOUS) ×10 IMPLANT
GAUZE SPONGE 4X4 12PLY STRL (GAUZE/BANDAGES/DRESSINGS) ×10 IMPLANT
GLOVE SURG SIGNA 7.5 PF LTX (GLOVE) ×15 IMPLANT
GOWN STRL REUS W/ TWL LRG LVL3 (GOWN DISPOSABLE) ×12 IMPLANT
GOWN STRL REUS W/ TWL XL LVL3 (GOWN DISPOSABLE) ×6 IMPLANT
GOWN STRL REUS W/TWL LRG LVL3 (GOWN DISPOSABLE) ×8
GOWN STRL REUS W/TWL XL LVL3 (GOWN DISPOSABLE) ×4
HEMOSTAT POWDER SURGIFOAM 1G (HEMOSTASIS) ×15 IMPLANT
HEMOSTAT SURGICEL 2X14 (HEMOSTASIS) ×5 IMPLANT
INSERT FOGARTY XLG (MISCELLANEOUS) IMPLANT
KIT BASIN OR (CUSTOM PROCEDURE TRAY) ×5 IMPLANT
KIT ROOM TURNOVER OR (KITS) ×5 IMPLANT
KIT SUCTION CATH 14FR (SUCTIONS) ×10 IMPLANT
KIT VASOVIEW HEMOPRO VH 3000 (KITS) ×5 IMPLANT
MARKER GRAFT CORONARY BYPASS (MISCELLANEOUS) ×15 IMPLANT
NS IRRIG 1000ML POUR BTL (IV SOLUTION) ×25 IMPLANT
PACK OPEN HEART (CUSTOM PROCEDURE TRAY) ×5 IMPLANT
PAD ARMBOARD 7.5X6 YLW CONV (MISCELLANEOUS) ×10 IMPLANT
PAD ELECT DEFIB RADIOL ZOLL (MISCELLANEOUS) ×5 IMPLANT
PENCIL BUTTON HOLSTER BLD 10FT (ELECTRODE) ×5 IMPLANT
PUNCH AORTIC ROTATE 4.0MM (MISCELLANEOUS) IMPLANT
PUNCH AORTIC ROTATE 4.5MM 8IN (MISCELLANEOUS) ×5 IMPLANT
PUNCH AORTIC ROTATE 5MM 8IN (MISCELLANEOUS) IMPLANT
SENSOR MYOCARDIAL TEMP (MISCELLANEOUS) ×5 IMPLANT
SET CARDIOPLEGIA MPS 5001102 (MISCELLANEOUS) ×5 IMPLANT
SPONGE GAUZE 4X4 12PLY STER LF (GAUZE/BANDAGES/DRESSINGS) ×10 IMPLANT
SPONGE LAP 18X18 X RAY DECT (DISPOSABLE) ×5 IMPLANT
SUT BONE WAX W31G (SUTURE) ×5 IMPLANT
SUT ETHILON 3 0 FSL (SUTURE) ×5 IMPLANT
SUT MNCRL AB 4-0 PS2 18 (SUTURE) ×15 IMPLANT
SUT PROLENE 3 0 SH DA (SUTURE) ×5 IMPLANT
SUT PROLENE 4 0 RB 1 (SUTURE)
SUT PROLENE 4 0 SH DA (SUTURE) IMPLANT
SUT PROLENE 4-0 RB1 .5 CRCL 36 (SUTURE) IMPLANT
SUT PROLENE 6 0 C 1 30 (SUTURE) ×20 IMPLANT
SUT PROLENE 7 0 BV1 MDA (SUTURE) ×15 IMPLANT
SUT PROLENE 8 0 BV175 6 (SUTURE) ×5 IMPLANT
SUT STEEL 6MS V (SUTURE) ×5 IMPLANT
SUT STEEL STERNAL CCS#1 18IN (SUTURE) IMPLANT
SUT STEEL SZ 6 DBL 3X14 BALL (SUTURE) ×5 IMPLANT
SUT VIC AB 1 CTX 36 (SUTURE) ×4
SUT VIC AB 1 CTX36XBRD ANBCTR (SUTURE) ×6 IMPLANT
SUT VIC AB 2-0 CT1 27 (SUTURE) ×6
SUT VIC AB 2-0 CT1 TAPERPNT 27 (SUTURE) ×9 IMPLANT
SUT VIC AB 2-0 CTX 27 (SUTURE) IMPLANT
SUT VIC AB 3-0 SH 27 (SUTURE)
SUT VIC AB 3-0 SH 27X BRD (SUTURE) IMPLANT
SUT VIC AB 3-0 X1 27 (SUTURE) IMPLANT
SUT VICRYL 4-0 PS2 18IN ABS (SUTURE) IMPLANT
SUTURE E-PAK OPEN HEART (SUTURE) ×5 IMPLANT
SYSTEM SAHARA CHEST DRAIN ATS (WOUND CARE) ×5 IMPLANT
TAPE CLOTH SURG 4X10 WHT LF (GAUZE/BANDAGES/DRESSINGS) ×5 IMPLANT
TAPE PAPER 3X10 WHT MICROPORE (GAUZE/BANDAGES/DRESSINGS) ×5 IMPLANT
TOWEL OR 17X24 6PK STRL BLUE (TOWEL DISPOSABLE) ×10 IMPLANT
TOWEL OR 17X26 10 PK STRL BLUE (TOWEL DISPOSABLE) ×10 IMPLANT
TRAY FOLEY IC TEMP SENS 16FR (CATHETERS) ×5 IMPLANT
TUBE FEEDING 8FR 16IN STR KANG (MISCELLANEOUS) ×5 IMPLANT
TUBE SUCT INTRACARD DLP 20F (MISCELLANEOUS) ×5 IMPLANT
TUBING INSUFFLATION (TUBING) ×5 IMPLANT
UNDERPAD 30X30 (UNDERPADS AND DIAPERS) ×5 IMPLANT
WATER STERILE IRR 1000ML POUR (IV SOLUTION) ×10 IMPLANT
YANKAUER SUCT BULB TIP NO VENT (SUCTIONS) ×5 IMPLANT

## 2016-11-09 NOTE — Progress Notes (Signed)
T[facility] BRIEF SICU PROGRESS NOTE  Day of Surgery  S/P Procedure(s) (LRB): CORONARY ARTERY BYPASS GRAFTING (CABG) x 2 (N/A) TRANSESOPHAGEAL ECHOCARDIOGRAM (TEE) (N/A) ENDOVEIN HARVEST OF GREATER SAPHENOUS VEIN (Left)   Just extubated Follows simple commands AV paced w/ stable hemodynamics, on NTG for HTN O2 sats 99% Chest tube output low Excellent UOP Labs okay  Plan: Continue current plan  Rexene Alberts, MD 11/09/2016 7:35 PM

## 2016-11-09 NOTE — Anesthesia Postprocedure Evaluation (Signed)
Anesthesia Post Note  Patient: Edward [name]  Procedure(s) Performed: Procedure(s) (LRB): CORONARY ARTERY BYPASS GRAFTING (CABG) x 2 (N/A) TRANSESOPHAGEAL ECHOCARDIOGRAM (TEE) (N/A) ENDOVEIN HARVEST OF GREATER SAPHENOUS VEIN (Left)  Patient location during evaluation: SICU Anesthesia Type: General Level of consciousness: sedated Pain management: pain level controlled Vital Signs Assessment: post-procedure vital signs reviewed and stable Respiratory status: patient remains intubated per anesthesia plan Cardiovascular status: stable Anesthetic complications: no       Last Vitals:  Vitals:   11/09/16 1345 11/09/16 1400  BP:  120/70  Pulse: 80 80  Resp: 12 17  Temp: (!) 35.9 C (!) 36 C    Last Pain:  Vitals:   11/09/16 0407  TempSrc: Oral  PainSc:                  Samuel Mcpeek DANIEL

## 2016-11-09 NOTE — Brief Op Note (Addendum)
10/29/2016 - 11/09/2016  11:38 AM  PATIENT:  Edward [name]  76 y.o. male  PRE-OPERATIVE DIAGNOSIS:  LEFT MAIN CORONARY ARTERY DISEASE  POST-OPERATIVE DIAGNOSIS:  LEFT MAIN CORONARY ARTERY DISEASE  PROCEDURE:  Procedure(s):  CORONARY ARTERY BYPASS GRAFTING (CABG) x 2  LIMA to LAD SVG to OM  TRANSESOPHAGEAL ECHOCARDIOGRAM (TEE) (N/A)  ENDOVEIN HARVEST OF GREATER SAPHENOUS VEIN (Left) -Right Leg Explored, -Left Thigh harvested  SURGEON:  Surgeon(s) and Role:    * Melrose Nakayama, MD - Primary  PHYSICIAN ASSISTANT: Ellwood Handler PA-C  ANESTHESIA:   general  EBL:  Total I/O In: 1800 [I.V.:1800] Out: 450 [Urine:450]  BLOOD ADMINISTERED:CELLSAVER  DRAINS: Left Pleural Chest Tube, Mediastinal Chest Drains   LOCAL MEDICATIONS USED:  NONE  SPECIMEN:  No Specimen  DISPOSITION OF SPECIMEN:  N/A  COUNTS:  YES  PLAN OF CARE: Admit to inpatient   PATIENT DISPOSITION:  ICU - intubated and hemodynamically stable.   Delay start of Pharmacological VTE agent (>24hrs) due to surgical blood loss or risk of bleeding: yes  XC= 40 min CPB= 66 min

## 2016-11-09 NOTE — Plan of Care (Signed)
Problem: Activity: Goal: Risk for activity intolerance will decrease Outcome: Progressing Pt to be OOB in AM  Problem: Bowel/Gastric: Goal: Gastrointestinal status for postoperative course will improve Outcome: Progressing Hypoactive BS returning  Problem: Cardiac: Goal: Hemodynamic stability will improve Outcome: Progressing On minimal gtt's Goal: Ability to maintain an adequate cardiac output will improve Outcome: Progressing CI <2 Goal: Will show no signs and symptoms of excessive bleeding Outcome: Progressing Chest tube output less than 136ml/hr

## 2016-11-09 NOTE — Anesthesia Procedure Notes (Signed)
Procedure Name: Intubation Date/Time: 11/09/2016 8:45 AM Performed by: Jacquiline Doe A Pre-anesthesia Checklist: Patient identified, Emergency Drugs available, Suction available and Patient being monitored Patient Re-evaluated:Patient Re-evaluated prior to inductionOxygen Delivery Method: Circle System Utilized and Circle system utilized Preoxygenation: Pre-oxygenation with 100% oxygen Intubation Type: IV induction and Cricoid Pressure applied Ventilation: Mask ventilation without difficulty and Oral airway inserted - appropriate to patient size Laryngoscope Size: Mac, 3 and Glidescope Grade View: Grade I Tube type: Oral Number of attempts: 1 Airway Equipment and Method: Oral airway,  Video-laryngoscopy and Rigid stylet Placement Confirmation: ETT inserted through vocal cords under direct vision,  positive ETCO2 and breath sounds checked- equal and bilateral Secured at: 23 cm Tube secured with: Tape Dental Injury: Teeth and Oropharynx as per pre-operative assessment  Difficulty Due To: Difficulty was unanticipated, Difficult Airway- due to large tongue, Difficult Airway- due to reduced neck mobility and Difficult Airway- due to anterior larynx Future Recommendations: Recommend- induction with short-acting agent, and alternative techniques readily available

## 2016-11-09 NOTE — Progress Notes (Signed)
  Echocardiogram Echocardiogram Transesophageal has been performed.  Darlina Sicilian M 11/09/2016, 9:42 AM

## 2016-11-09 NOTE — Procedures (Signed)
Extubation Procedure Note  Patient Details:   Name: Edward [name] DOB: 12-23-1940 MRN: [id number]   Airway Documentation:     Evaluation  O2 sats: stable throughout Complications: No apparent complications Patient did tolerate procedure well. Bilateral Breath Sounds: Clear   Yes  Pt placed on 4lpm [place]   Cordella Register 11/09/2016, 6:58 PM

## 2016-11-09 NOTE — H&P (View-Only) (Signed)
Reason for Consult:Left main disease Referring Physician: Dr. Julien [name] is an 76 y.o. male.  HPI: 76 yo man who presents with a c/o CP  Edward [name] is a 76 yo man with a history of known single vessel CAD with an ostial circumflex stenosis treated medically. 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 He also has a history of GERD, sleep apnea and a remote TIA. HE presents with accelerating CP with exertion.   Cardiac catheterization 1/3 showed left main stenosis and ostial LCX stenosis.   Past Medical History:  Diagnosis Date  . Adenomatous colon polyp 04/2011  . CAD (coronary artery disease)    a. 01/2015 DES to LAD  b. 12/2015: [place] 85% oLCx lesion--> rx therapy.  . Chronic diastolic CHF (congestive heart failure) ([place])   . CKD (chronic kidney disease), stage II    a. probable CKD II-III with baseline CR 1.1-1.3.  . DM type 2 (diabetes mellitus, type 2), insulin dependent 01/29/2014   fasting cbg 50-120 with new regimen  . Dyslipidemia, goal LDL below 70 01/29/2014  . Enlarged prostate   . Essential tremor    a. on proprnolol  . GERD (gastroesophageal reflux disease)   . Hypertension   . Internal hemorrhoid   . Sleep apnea    does not use cpap (06/04/2015)  . TIA (transient ischemic attack) 2002    Past Surgical History:  Procedure Laterality Date  . BACK SURGERY    . CARDIAC CATHETERIZATION  01/28/14   + CAD treat medically  . CARDIAC CATHETERIZATION N/A 12/21/2015   Procedure: Right/Left Heart Cath and Coronary Angiography;  Surgeon: Belva Crome, MD;  Location: [place] [facility];  Service: Cardiovascular;  Laterality: N/A;  . CARDIAC CATHETERIZATION N/A 11/02/2016   Procedure: Left Heart Cath and Coronary Angiography;  Surgeon: Nelva Bush, MD;  Location: [place] [facility];  Service: Cardiovascular;  Laterality: N/A;  . CATARACT EXTRACTION Bilateral   . CATARACT EXTRACTION,  BILATERAL Bilateral   . CORONARY ANGIOPLASTY WITH STENT PLACEMENT  01/30/2015   DES [place]  [place] to LAD by Dr Tamala Julian  . JOINT REPLACEMENT    . LEFT HEART CATHETERIZATION WITH CORONARY ANGIOGRAM N/A 01/28/2014   Procedure: LEFT HEART CATHETERIZATION WITH CORONARY ANGIOGRAM;  Surgeon: Sinclair Grooms, MD;  Location: [facility] CATH [facility];  Service: Cardiovascular;  Laterality: N/A;  . LEFT HEART CATHETERIZATION WITH CORONARY ANGIOGRAM N/A 01/30/2015   Procedure: LEFT HEART CATHETERIZATION WITH CORONARY ANGIOGRAM;  Surgeon: Belva Crome, MD;  Location: [facility] CATH LAB;  Service: Cardiovascular;  Laterality: N/A;  . LUMBAR [place]    . SHOULDER ARTHROSCOPY Right 07/30/2014   Procedure: Right Shoulder Arthroscopy, Debridement, Decompression, Manipulation Under Anesthesia;  Surgeon: Newt Minion, MD;  Location: [place];  Service: Orthopedics;  Laterality: Right;  . TOTAL KNEE ARTHROPLASTY Bilateral     Family History  Problem Relation Age of Onset  . CAD Brother     2 brothers - CABG  . Alzheimer's disease Sister   . CAD Sister   . Prostate cancer Brother   . Asthma Brother     2 brothers   . Diabetes Other     entire family    Social History:  reports that he has never smoked. He has never used smokeless tobacco. He reports that he does not drink alcohol or use drugs.  Allergies: No Known Allergies  Medications:  Scheduled: . amLODipine  10 mg Oral Daily  . aspirin EC  81 mg Oral Daily  . atorvastatin  80 mg Oral q1800  . dicyclomine  10 mg Oral TID AC  . fenofibrate  160 mg Oral Daily  . fluticasone furoate-vilanterol  1 puff Inhalation Daily  . gabapentin  300 mg Oral BID  . insulin aspart  0-20 Units Subcutaneous TID WC  . insulin aspart protamine- aspart  60 Units Subcutaneous BID WC  . isosorbide mononitrate  90 mg Oral Daily  . pantoprazole  40 mg Oral BID  . propranolol  40 mg Oral TID  . ranolazine  1,000 mg Oral BID  . sodium chloride flush  3 mL Intravenous Q12H  . sodium  chloride flush  3 mL Intravenous Q12H    Results for orders placed or performed during the hospital encounter of 10/29/16 (from the past 48 hour(s))  Glucose, capillary     Status: Abnormal   Collection Time: 11/01/16 11:20 AM  Result Value Ref Range   Glucose-Capillary 281 (H) 65 - 99 mg/dL  Glucose, capillary     Status: Abnormal   Collection Time: 11/01/16  3:52 PM  Result Value Ref Range   Glucose-Capillary 232 (H) 65 - 99 mg/dL  Heparin level (unfractionated)     Status: Abnormal   Collection Time: 11/02/16  6:13 AM  Result Value Ref Range   Heparin Unfractionated 0.26 (L) 0.30 - 0.70 IU/mL    Comment:        IF HEPARIN RESULTS ARE BELOW EXPECTED VALUES, AND PATIENT DOSAGE HAS BEEN CONFIRMED, SUGGEST FOLLOW UP TESTING OF ANTITHROMBIN III LEVELS.   CBC     Status: Abnormal   Collection Time: 11/02/16  6:13 AM  Result Value Ref Range   WBC 8.3 4.0 - 10.5 K/uL   RBC 4.18 (L) 4.22 - 5.81 MIL/uL   Hemoglobin 11.9 (L) 13.0 - 17.0 g/dL   HCT 35.6 (L) 39.0 - 52.0 %   MCV 85.2 78.0 - 100.0 fL   MCH 28.5 26.0 - 34.0 pg   MCHC 33.4 30.0 - 36.0 g/dL   RDW 13.3 11.5 - 15.5 %   Platelets 243 150 - 400 K/uL  Basic metabolic panel     Status: Abnormal   Collection Time: 11/02/16  6:13 AM  Result Value Ref Range   Sodium 141 135 - 145 mmol/L   Potassium 4.5 3.5 - 5.1 mmol/L   Chloride 107 101 - 111 mmol/L   CO2 26 22 - 32 mmol/L   Glucose, Bld 98 65 - 99 mg/dL   BUN 15 6 - 20 mg/dL   Creatinine, Ser 1.32 (H) 0.61 - 1.24 mg/dL   Calcium 8.6 (L) 8.9 - 10.3 mg/dL   GFR calc non Af Amer 51 (L) >60 mL/min   GFR calc Af Amer 60 (L) >60 mL/min    Comment: (NOTE) The eGFR has been calculated using the CKD EPI equation. This calculation has not been validated in all clinical situations. eGFR's persistently <60 mL/min signify possible Chronic Kidney Disease.    Anion gap 8 5 - 15  Glucose, capillary     Status: Abnormal   Collection Time: 11/02/16  7:37 AM  Result Value Ref Range    Glucose-Capillary 111 (H) 65 - 99 mg/dL  Glucose, capillary     Status: Abnormal   Collection Time: 11/02/16 12:28 PM  Result Value Ref Range   Glucose-Capillary 257 (H) 65 - 99 mg/dL  Glucose, capillary  Status: Abnormal   Collection Time: 11/02/16  4:41 PM  Result Value Ref Range   Glucose-Capillary 305 (H) 65 - 99 mg/dL  Glucose, capillary     Status: Abnormal   Collection Time: 11/02/16 10:12 PM  Result Value Ref Range   Glucose-Capillary 174 (H) 65 - 99 mg/dL  Heparin level (unfractionated)     Status: Abnormal   Collection Time: 11/02/16 11:33 PM  Result Value Ref Range   Heparin Unfractionated 0.15 (L) 0.30 - 0.70 IU/mL    Comment:        IF HEPARIN RESULTS ARE BELOW EXPECTED VALUES, AND PATIENT DOSAGE HAS BEEN CONFIRMED, SUGGEST FOLLOW UP TESTING OF ANTITHROMBIN III LEVELS.   Basic metabolic panel     Status: Abnormal   Collection Time: 11/03/16  2:57 AM  Result Value Ref Range   Sodium 139 135 - 145 mmol/L   Potassium 3.9 3.5 - 5.1 mmol/L   Chloride 105 101 - 111 mmol/L   CO2 29 22 - 32 mmol/L   Glucose, Bld 88 65 - 99 mg/dL   BUN 14 6 - 20 mg/dL   Creatinine, Ser 1.38 (H) 0.61 - 1.24 mg/dL   Calcium 8.8 (L) 8.9 - 10.3 mg/dL   GFR calc non Af Amer 49 (L) >60 mL/min   GFR calc Af Amer 57 (L) >60 mL/min    Comment: (NOTE) The eGFR has been calculated using the CKD EPI equation. This calculation has not been validated in all clinical situations. eGFR's persistently <60 mL/min signify possible Chronic Kidney Disease.    Anion gap 5 5 - 15  CBC     Status: Abnormal   Collection Time: 11/03/16  2:57 AM  Result Value Ref Range   WBC 7.5 4.0 - 10.5 K/uL   RBC 3.99 (L) 4.22 - 5.81 MIL/uL   Hemoglobin 11.3 (L) 13.0 - 17.0 g/dL   HCT 33.9 (L) 39.0 - 52.0 %   MCV 85.0 78.0 - 100.0 fL   MCH 28.3 26.0 - 34.0 pg   MCHC 33.3 30.0 - 36.0 g/dL   RDW 13.2 11.5 - 15.5 %   Platelets 265 150 - 400 K/uL  Glucose, capillary     Status: Abnormal   Collection Time:  11/03/16  7:36 AM  Result Value Ref Range   Glucose-Capillary 120 (H) 65 - 99 mg/dL    No results found.  Review of Systems  Constitutional: Positive for malaise/fatigue. Negative for chills and fever.  Respiratory: Positive for shortness of breath.        Sleep apnea  Cardiovascular: Positive for chest pain and leg swelling.  Genitourinary: Positive for frequency and urgency.  Neurological: Negative for speech change, focal weakness and loss of consciousness.   Blood pressure (!) 134/55, pulse (!) 59, temperature 98.2 F (36.8 C), temperature source Oral, resp. rate 16, height 5' 8" (1.727 m), weight 274 lb 8 oz (124.5 kg), SpO2 100 %. Physical Exam  Vitals reviewed. Constitutional: No distress.  obese  HENT:  Head: Normocephalic and atraumatic.  Mouth/Throat: No oropharyngeal exudate.  Eyes: Conjunctivae and EOM are normal. No scleral icterus.  Neck: Neck supple. No thyromegaly present.  No carotid bruits  Cardiovascular: Normal rate, regular rhythm and normal heart sounds.   No murmur heard. Respiratory: Effort normal and breath sounds normal. No respiratory distress. He has no wheezes. He has no rales.  Musculoskeletal: He exhibits edema (2+ bilateral).  Lymphadenopathy:    He has no cervical adenopathy.  Skin: He is not diaphoretic.  CARDIAC CATHETERIZATION Conclusion   Conclusions: 1. Significant LMCA (60%) and ostial LCx (85%) stenoses.  Eccentric mid LMCA stenosis appears somewhat worse than prior angiogram in 12/2015.  Lesion was borderline significant at that time by IVUS. 2. Patent mid LAD stent with mild to moderate proximal LAD, LCx/OM, and RCA disease. 3. Mildly elevated left ventricular filling pressure.   Recommendations: 1. Cardiac surgery consultation for CABG, given progression of LMCA disease. 2. Discontinue clopidogrel in anticipation of possible CABG. 3. Restart heparin infusion 4 hours after TR band removal. 4. Continue aggressive secondary  prevention.  Findings and recommendations were discussed with Dr. Tamala Julian.  Nelva Bush, MD [facility] Pager: [phone]   I personally reviewed the cath images with Dr. Tamala Julian and concur with the findings noted above  Assessment/Plan: 76 yo man with multiple CRf and known CAD treated medically who presents with an unstable coronary syndrome. Catheterization showed progression of disease and now has significant left main stenosis. CABG indicated for survival benefit and relief of symptoms.  I have discussed the general nature of the procedure, the need for general anesthesia, the use of cardiopulmonary bypass, and the incisions to be used with Edward [name]. We discussed the expected hospital stay, overall recovery and short and long term outcomes. I informed him of the indications, risks, benefits and alternatives. He understands the risks include, but are not limited to death, stroke, MI, DVT/PE, bleeding, possible need for transfusion, infections,other organ system dysfunction including respiratory, renal, or GI complications. He accepts the risks and agrees to proceed.  For CABG next week after plavix washout. Date to be determined  Edward [name] 11/03/2016, 8:38 AM

## 2016-11-09 NOTE — Progress Notes (Signed)
Hypoglycemic Event  CBG: 58  Treatment: D50 IV 25 mL  Symptoms: Vision changes  Pt states seeing spots  Follow-up CBG: Time:430am  CBG Result:98  Possible Reasons for Event: Inadequate meal intake      Edward [name] M

## 2016-11-09 NOTE — Interval H&P Note (Signed)
History and Physical Interval Note:  11/09/2016 7:56 AM  [name]  has presented today for surgery, with the diagnosis of SEVERE CAD  The various methods of treatment have been discussed with the patient and family. After consideration of risks, benefits and other options for treatment, the patient has consented to  Procedure(s): CORONARY ARTERY BYPASS GRAFTING (CABG) (N/A) TRANSESOPHAGEAL ECHOCARDIOGRAM (TEE) (N/A) as a surgical intervention .  The patient's history has been reviewed, patient examined, no change in status, stable for surgery.  I have reviewed the patient's chart and labs.  Questions were answered to the patient's satisfaction.     Melrose Nakayama

## 2016-11-09 NOTE — Anesthesia Procedure Notes (Signed)
Central Venous Catheter Insertion Performed by: Duane Boston, anesthesiologist Start/End1/07/2017 7:58 AM, 11/09/2016 8:07 AM Patient location: Pre-op. Preanesthetic checklist: patient identified, IV checked, site marked, risks and benefits discussed, surgical consent, monitors and equipment checked, pre-op evaluation, timeout performed and anesthesia consent Position: Trendelenburg Lidocaine 1% used for infiltration and patient sedated Hand hygiene performed , maximum sterile barriers used  and Seldinger technique used Catheter size: 8.5 Fr Total catheter length 8. PA cath was placed.Sheath introducer Swan type:thermodilution PA Cath depth:50 Procedure performed using ultrasound guided technique. Ultrasound Notes:anatomy identified, needle tip was noted to be adjacent to the nerve/plexus identified, no ultrasound evidence of intravascular and/or intraneural injection and image(s) printed for medical record Attempts: 1 Following insertion, line sutured and dressing applied. Post procedure assessment: free fluid flow, blood return through all ports and no air  Patient tolerated the procedure well with no immediate complications.

## 2016-11-09 NOTE — Transfer of Care (Signed)
Immediate Anesthesia Transfer of Care Note  Patient: Edward [name]  Procedure(s) Performed: Procedure(s): CORONARY ARTERY BYPASS GRAFTING (CABG) x 2 (N/A) TRANSESOPHAGEAL ECHOCARDIOGRAM (TEE) (N/A) ENDOVEIN HARVEST OF GREATER SAPHENOUS VEIN (Left)  Patient Location: SICU  Anesthesia Type:General  Level of Consciousness: Patient remains intubated per anesthesia plan  Airway & Oxygen Therapy: Patient remains intubated per anesthesia plan and Patient placed on Ventilator (see vital sign flow sheet for setting)  Post-op Assessment: Report given to RN and Post -op Vital signs reviewed and stable  Post vital signs: Reviewed and stable  Last Vitals:  Vitals:   11/08/16 2210 11/09/16 0407  BP: (!) 132/55 (!) 135/56  Pulse: 63 (!) 56  Resp:  18  Temp:  36.8 C    Last Pain:  Vitals:   11/09/16 0407  TempSrc: Oral  PainSc:       Patients Stated Pain Goal: 0 [id number])  Complications: No apparent anesthesia complications

## 2016-11-10 ENCOUNTER — Encounter (HOSPITAL_COMMUNITY): Payer: Self-pay | Admitting: Thoracic Surgery (Cardiothoracic Vascular Surgery)

## 2016-11-10 ENCOUNTER — Inpatient Hospital Stay (HOSPITAL_COMMUNITY): Payer: Medicare Other

## 2016-11-10 LAB — CBC
HEMATOCRIT: 31.6 % — AB (ref 39.0–52.0)
HEMATOCRIT: 30.8 % — AB (ref 39.0–52.0)
HEMOGLOBIN: 10.2 g/dL — AB (ref 13.0–17.0)
Hemoglobin: 10.7 g/dL — ABNORMAL LOW (ref 13.0–17.0)
MCH: 28.5 pg (ref 26.0–34.0)
MCH: 28.2 pg (ref 26.0–34.0)
MCHC: 33.9 g/dL (ref 30.0–36.0)
MCHC: 33.1 g/dL (ref 30.0–36.0)
MCV: 84.3 fL (ref 78.0–100.0)
MCV: 85.1 fL (ref 78.0–100.0)
PLATELETS: 200 10*3/uL (ref 150–400)
Platelets: 191 10*3/uL (ref 150–400)
RBC: 3.75 MIL/uL — ABNORMAL LOW (ref 4.22–5.81)
RBC: 3.62 MIL/uL — ABNORMAL LOW (ref 4.22–5.81)
RDW: 13.4 % (ref 11.5–15.5)
RDW: 13.7 % (ref 11.5–15.5)
WBC: 12.4 10*3/uL — AB (ref 4.0–10.5)
WBC: 11.3 10*3/uL — ABNORMAL HIGH (ref 4.0–10.5)

## 2016-11-10 LAB — CREATININE, SERUM
Creatinine, Ser: 1.63 mg/dL — ABNORMAL HIGH (ref 0.61–1.24)
GFR calc Af Amer: 46 mL/min — ABNORMAL LOW (ref >60)
GFR, EST NON AFRICAN AMERICAN: 40 mL/min — AB (ref >60)

## 2016-11-10 LAB — GLUCOSE, CAPILLARY
GLUCOSE-CAPILLARY: 104 mg/dL — AB (ref 65–99)
GLUCOSE-CAPILLARY: 105 mg/dL — AB (ref 65–99)
GLUCOSE-CAPILLARY: 106 mg/dL — AB (ref 65–99)
GLUCOSE-CAPILLARY: 120 mg/dL — AB (ref 65–99)
GLUCOSE-CAPILLARY: 123 mg/dL — AB (ref 65–99)
GLUCOSE-CAPILLARY: 127 mg/dL — AB (ref 65–99)
GLUCOSE-CAPILLARY: 131 mg/dL — AB (ref 65–99)
GLUCOSE-CAPILLARY: 131 mg/dL — AB (ref 65–99)
GLUCOSE-CAPILLARY: 153 mg/dL — AB (ref 65–99)
GLUCOSE-CAPILLARY: 167 mg/dL — AB (ref 65–99)
GLUCOSE-CAPILLARY: 222 mg/dL — AB (ref 65–99)
Glucose-Capillary: 132 mg/dL — ABNORMAL HIGH (ref 65–99)
Glucose-Capillary: 129 mg/dL — ABNORMAL HIGH (ref 65–99)
Glucose-Capillary: 125 mg/dL — ABNORMAL HIGH (ref 65–99)
Glucose-Capillary: 127 mg/dL — ABNORMAL HIGH (ref 65–99)
Glucose-Capillary: 138 mg/dL — ABNORMAL HIGH (ref 65–99)
Glucose-Capillary: 137 mg/dL — ABNORMAL HIGH (ref 65–99)
Glucose-Capillary: 110 mg/dL — ABNORMAL HIGH (ref 65–99)

## 2016-11-10 LAB — BASIC METABOLIC PANEL
ANION GAP: 7 (ref 5–15)
BUN: 14 mg/dL (ref 6–20)
CALCIUM: 8 mg/dL — AB (ref 8.9–10.3)
CO2: 24 mmol/L (ref 22–32)
Chloride: 106 mmol/L (ref 101–111)
Creatinine, Ser: 1.48 mg/dL — ABNORMAL HIGH (ref 0.61–1.24)
GFR calc Af Amer: 52 mL/min — ABNORMAL LOW (ref >60)
GFR, EST NON AFRICAN AMERICAN: 45 mL/min — AB (ref >60)
Glucose, Bld: 113 mg/dL — ABNORMAL HIGH (ref 65–99)
Potassium: 4.5 mmol/L (ref 3.5–5.1)
SODIUM: 137 mmol/L (ref 135–145)

## 2016-11-10 LAB — POCT I-STAT, CHEM 8
BUN: 16 mg/dL (ref 6–20)
CALCIUM ION: 1.14 mmol/L — AB (ref 1.15–1.40)
CREATININE: 1.7 mg/dL — AB (ref 0.61–1.24)
Chloride: 101 mmol/L (ref 101–111)
GLUCOSE: 221 mg/dL — AB (ref 65–99)
HCT: 31 % — ABNORMAL LOW (ref 39.0–52.0)
HEMOGLOBIN: 10.5 g/dL — AB (ref 13.0–17.0)
Potassium: 4.1 mmol/L (ref 3.5–5.1)
Sodium: 136 mmol/L (ref 135–145)
TCO2: 24 mmol/L (ref 0–100)

## 2016-11-10 LAB — MAGNESIUM
Magnesium: 2 mg/dL (ref 1.7–2.4)
Magnesium: 1.9 mg/dL (ref 1.7–2.4)

## 2016-11-10 MED ORDER — INSULIN DETEMIR 100 UNIT/ML ~~LOC~~ SOLN
40.0000 [IU] | Freq: Two times a day (BID) | SUBCUTANEOUS | Status: DC
  Administered 2016-11-10 (×2): 40 [IU] via SUBCUTANEOUS
  Filled 2016-11-10 (×3): qty 0.4

## 2016-11-10 MED ORDER — INSULIN DETEMIR 100 UNIT/ML ~~LOC~~ SOLN: 40.0000 [IU] | Freq: Two times a day (BID) | SUBCUTANEOUS | Status: DC

## 2016-11-10 MED ORDER — ORAL CARE MOUTH RINSE
15.0000 mL | Freq: Two times a day (BID) | OROMUCOSAL | Status: DC
  Administered 2016-11-10 – 2016-11-14 (×6): 15 mL via OROMUCOSAL

## 2016-11-10 MED ORDER — ENOXAPARIN SODIUM 40 MG/0.4ML ~~LOC~~ SOLN
40.0000 mg | Freq: Every day | SUBCUTANEOUS | Status: DC
  Administered 2016-11-10 – 2016-11-14 (×5): 40 mg via SUBCUTANEOUS
  Filled 2016-11-10 (×5): qty 0.4

## 2016-11-10 MED ORDER — INSULIN ASPART 100 UNIT/ML ~~LOC~~ SOLN
6.0000 [IU] | Freq: Three times a day (TID) | SUBCUTANEOUS | Status: DC
  Administered 2016-11-10 – 2016-11-12 (×6): 6 [IU] via SUBCUTANEOUS

## 2016-11-10 MED ORDER — INSULIN ASPART 100 UNIT/ML ~~LOC~~ SOLN
0.0000 [IU] | SUBCUTANEOUS | Status: DC
  Administered 2016-11-10: 2 [IU] via SUBCUTANEOUS
  Administered 2016-11-10: 4 [IU] via SUBCUTANEOUS
  Administered 2016-11-10 – 2016-11-11 (×2): 8 [IU] via SUBCUTANEOUS

## 2016-11-10 NOTE — Op Note (Signed)
NAME:  Edward [name], Edward [name]                ACCOUNT NO.:  [id number]  MEDICAL RECORD NO.:  [id number]  LOCATION:                                 FACILITY:  PHYSICIAN:  Revonda Standard. Roxan Hockey, M.D.DATE OF BIRTH:  02/17/1941  DATE OF PROCEDURE:  11/09/2016 DATE OF DISCHARGE:                              OPERATIVE REPORT   PREOPERATIVE DIAGNOSIS:  Left main coronary artery disease.  POSTOPERATIVE DIAGNOSIS:  Left main coronary artery disease.  PROCEDURE:   Median sternotomy, extracorporeal circulation, Coronary artery bypass grafting x 2  Left internal mammary artery to left anterior descending,  Saphenous vein graft to obtuse marginal 1 Edoscopic vein harvest left thigh.  SURGEON:  Revonda Standard. Roxan Hockey, M.D.  ASSISTANT:  Ellwood Handler, PA.  ANESTHESIA:  General.  FINDINGS:  Transesophageal echocardiography revealed overall preserved left ventricular function, mild aortic and tricuspid insufficiency.  Good quality target vessels and good quality conduits.  CLINICAL NOTE:  Mr. Verdone is a 76 year old man with known coronary artery disease who presented with accelerating exertional angina. Cardiac catheterization showed left main stenosis in addition to his pre- existing ostial left circumflex stenosis.  He was advised to undergo coronary artery bypass grafting and the surgery was scheduled for November 09, 2016.  The indications, risks, benefits, and alternatives were discussed in detail with the patient.  He understood and accepted the risks and agreed to proceed.  OPERATIVE NOTE:  Mr. Reffett was brought to the preoperative holding area on November 09, 2016.  Dr. Tedra [name] of the Anesthesia Service placed a Swan-Ganz catheter and an arterial blood pressure monitoring line.  He was taken to the operating room, anesthetized, and intubated.  Dr. Tobias Alexander performed a transesophageal echocardiography with findings as previously noted.  Intravenous antibiotics were administered.  A  Foley catheter was placed.  The chest, abdomen, and legs were prepped and draped in usual sterile fashion.  A median sternotomy was performed.  The left internal mammary artery was harvested using standard technique.  An incision was made in the medial aspect of the right leg at 2 separate locations, but the saphenous vein was not identified.  The saphenous vein was identified in the left leg and harvested endoscopically from the left thigh. No vein was taken from the right leg.  2000 units of heparin was administered during the vessel harvest.  The remainder of the full heparin dose was given prior to opening the pericardium.  After harvesting the conduits, the remainder of the full heparin dose was given.  The pericardium was opened.  The ascending aorta was inspected, it was of normal size with no evidence of atherosclerotic disease.  After confirming adequate anticoagulation with ACT measurement, the aorta was cannulated via concentric 2-0 Ethibond pledgeted pursestring sutures.  A dual stage venous cannula was placed via pursestring suture in the right atrial appendage.  Cardiopulmonary bypass was initiated.  Flows were maintained per protocol.  The patient was cooled to 34 degrees Celsius.  The coronary arteries were inspected and anastomotic sites were chosen.  The conduits were inspected and cut to length.  A foam pad was placed in the pericardium to insulate the heart.  A temperature probe was placed  in the myocardial septum and a cardioplegia cannula was placed in the ascending aorta.  The aorta was crossclamped, the left ventricle was emptied via the aortic root vent.  Cardiac arrest then was achieved with combination of cold antegrade blood cardioplegia and topical iced saline, 1.5 L of cardioplegia was administered.  There was rapid diastolic arrest and septal cooling to 10 degrees Celsius.  A reversed saphenous vein graft was placed end-to-side to the first obtuse  marginal branch of the left circumflex.  This was relatively high on the lateral wall.  It was a 2 mm good quality target.  The vein was of good quality.  An end-to-side anastomosis was performed with a running 7-0 Prolene suture.  Cardioplegia was administered down the graft at its completion and there were good flow and good hemostasis.  Next, additional cardioplegia was administered down the aortic root. The left internal mammary artery then was brought through a window in the pericardium.  The distal end was beveled, it was anastomosed end-to- side to the distal LAD.  Both the mammary and LAD were 2 mm good quality vessels.  The end-to-side anastomosis was performed with a running 8-0 Prolene suture.  At the completion of the mammary to LAD anastomosis, the bulldog clamp was briefly removed to inspect for hemostasis.  Rapid septal rewarming was noted.  The bulldog clamp was replaced.  The mammary pedicle was tacked to the epicardial surface of the heart with 6- 0 Prolene sutures.  The cardioplegia cannula was removed from the ascending aorta.  The vein graft was cut to length.  The proximal vein graft anastomosis was performed to a 4.5-mm punch aortotomy with a running 6-0 Prolene suture. At the completion of the final proximal anastomosis, the patient was placed in Trendelenburg position.  Lidocaine was administered.  The bulldog clamp was again removed from the left mammary artery.  The aortic root was de-aired and the aortic crossclamp was removed.  The total crossclamp time was 40 minutes. The patient required a single defibrillation with 10 joules and remained in sinus rhythm thereafter, although he was relatively bradycardic.  While rewarming was completed, the proximal and distal anastomoses were inspected for hemostasis.  Epicardial pacing wires were placed on the right ventricle and right atrium.  DDD pacing was initiated.  When the patient had rewarmed to a core  temperature of 37 degrees Celsius, he was weaned from cardiopulmonary bypass on the first attempt, the total bypass time was 66 minutes.  The postbypass transesophageal images were unchanged from the prebypass study.  The initial cardiac index was greater than 2 L/min/m2.  The patient remained hemodynamically stable throughout the postbypass period.  A test dose of protamine was administered and was well tolerated.  The remainder of the protamine was administered without incident.  The chest irrigated with warm saline.  Hemostasis was achieved.  Left pleural and mediastinal chest tubes were placed through separate subcostal incisions.  The pericardium was reapproximated with interrupted 3-0 silk sutures, it came together easily without tension or kinking the underlying grafts.  The sternum was closed with a combination of single and double heavy gauge stainless steel wires, the pectoralis fascia, subcutaneous tissue, and skin were closed in standard fashion.  All sponge, needle, and instrument counts were correct at the end of the procedure.  The patient was taken from the operating room to the Surgical Intensive Care Unit intubated and in good condition.     Revonda Standard Roxan Hockey, M.D.     SCH/MEDQ  D:  11/09/2016  T:  11/10/2016  Job:  [id number]

## 2016-11-10 NOTE — Progress Notes (Signed)
1 Day Post-Op Procedure(s) (LRB): CORONARY ARTERY BYPASS GRAFTING (CABG) x 2 (N/A) TRANSESOPHAGEAL ECHOCARDIOGRAM (TEE) (N/A) ENDOVEIN HARVEST OF GREATER SAPHENOUS VEIN (Left) Subjective: Nausea earlier, none at present Some incisional pain  Objective: Vital signs in last 24 hours: Temp:  [96.6 F (35.9 C)-99.9 F (37.7 C)] 99.5 F (37.5 C) (01/11 0715) Pulse Rate:  [70-80] 70 (01/11 0715) Cardiac Rhythm: A-V Sequential paced (01/11 0430) Resp:  [10-27] 16 (01/11 0715) BP: (100-157)/(61-127) 130/61 (01/11 0700) SpO2:  [93 %-100 %] 97 % (01/11 0715) Arterial Line BP: (110-179)/(45-79) 137/47 (01/11 0715) FiO2 (%):  [40 %-50 %] 40 % (01/10 1845) Weight:  [269 lb 13.5 oz (122.4 kg)] 269 lb 13.5 oz (122.4 kg) (01/11 0500)  Hemodynamic parameters for last 24 hours: PAP: (21-44)/(9-26) 31/26 CO:  [3.7 L/min-7.1 L/min] 7.1 L/min CI:  [1.6 L/min/m2-3.1 L/min/m2] 3.1 L/min/m2  Intake/Output from previous day: 01/10 0701 - 01/11 0700 In: 5110 [I.V.:3880; Blood:350; NG/GT:30; IV Piggyback:850] Out: 4610 [Urine:3110; Blood:1000; Chest Tube:500] Intake/Output this shift: No intake/output data recorded.  General appearance: alert, cooperative and no distress Neurologic: intact Heart: regular rate and rhythm Lungs: diminished breath sounds bibasilar Abdomen: normal findings: soft, non-tender  Lab Results:  Recent Labs  11/09/16 1843 11/09/16 1849 11/10/16 0420  WBC 9.3  --  12.4*  HGB 11.3* 11.6* 10.7*  HCT 33.2* 34.0* 31.6*  PLT 190  --  200   BMET:  Recent Labs  11/09/16 0220  11/09/16 1849 11/10/16 0420  NA 137  < > 139 137  K 4.1  < > 4.2 4.5  CL 107  < > 103 106  CO2 24  --   --  24  GLUCOSE 81  < > 149* 113*  BUN 22*  < > 16 14  CREATININE 1.54*  < > 1.20 1.48*  CALCIUM 9.0  --   --  8.0*  < > = values in this interval not displayed.  PT/INR:  Recent Labs  11/09/16 1330  LABPROT 16.7*  INR 1.35   ABG    Component Value Date/Time   PHART 7.341 (L)  11/09/2016 2003   HCO3 24.2 11/09/2016 2003   TCO2 26 11/09/2016 2003   ACIDBASEDEF 2.0 11/09/2016 2003   O2SAT 97.0 11/09/2016 2003   CBG (last 3)   Recent Labs  11/10/16 0414 11/10/16 0520 11/10/16 0620  GLUCAP 105* 129* 125*    Assessment/Plan: S/P Procedure(s) (LRB): CORONARY ARTERY BYPASS GRAFTING (CABG) x 2 (N/A) TRANSESOPHAGEAL ECHOCARDIOGRAM (TEE) (N/A) ENDOVEIN HARVEST OF GREATER SAPHENOUS VEIN (Left) -POD # 1 Doing well CV- in SR with first degree block  Good hemodynamics- dc swan and A line  Statin, ASA, beta blocker  Not on ACE-I due to renal function  RESP- IS for atelectasis  RENAL- creatinine 1.48 near baseline- follow  ENDO_ CBG well controlled with insulin drip. Very resistant. Will change to levemir + SSi  Anemia secondary to ABL- mild, follow  DC chest tubes  SCD + enoxaparin for DVT prophylaxis   LOS: 11 days    Melrose Nakayama 11/10/2016

## 2016-11-10 NOTE — Progress Notes (Signed)
Patient Name: Edward [name] Date of Encounter: 11/10/2016  Primary Cardiologist: Dr. Laurena Bering Problem List     Principal Problem:   Atherosclerosis of native coronary artery of native heart with unstable angina pectoris (HCC) Active Problems:   Dyslipidemia, goal LDL below 70   HTN (hypertension)   Essential tremor   Type 2 diabetes mellitus with circulatory disorder, with long-term current use of insulin (HCC)   Chronic diastolic HF (heart failure) (HCC)   Chronic kidney disease, stage III (moderate)   Hypertensive heart disease without heart failure     Subjective   Feeling well.  Tired from sitting up in the chair.   Inpatient Medications    Scheduled Meds: . acetaminophen  1,000 mg Oral Q6H   Or  . acetaminophen (TYLENOL) oral liquid 160 mg/5 mL  1,000 mg Per Tube Q6H  . aspirin EC  325 mg Oral Daily   Or  . aspirin  324 mg Per Tube Daily  . atorvastatin  80 mg Oral q1800  . bisacodyl  10 mg Oral Daily   Or  . bisacodyl  10 mg Rectal Daily  . cefUROXime (ZINACEF)  IV  1.5 g Intravenous Q12H  . dicyclomine  10 mg Oral TID AC  . docusate sodium  200 mg Oral Daily  . enoxaparin (LOVENOX) injection  40 mg Subcutaneous QHS  . fenofibrate  160 mg Oral Daily  . fluticasone furoate-vilanterol  1 puff Inhalation Daily  . gabapentin  300 mg Oral BID  . insulin aspart  0-24 Units Subcutaneous Q4H  . insulin aspart  6 Units Subcutaneous TID WC  . insulin detemir  40 Units Subcutaneous BID  . insulin regular  0-10 Units Intravenous TID WC  . mouth rinse  15 mL Mouth Rinse BID  . metoprolol tartrate  12.5 mg Oral BID   Or  . metoprolol tartrate  12.5 mg Per Tube BID  . [START ON 11/11/2016] pantoprazole  40 mg Oral Daily  . potassium chloride (KCL MULTIRUN) 30 mEq in 265 mL IVPB  30 mEq Intravenous Once  . sodium chloride flush  3 mL Intravenous Q12H   Continuous Infusions: . sodium chloride    . sodium chloride    . sodium chloride Stopped (11/10/16 0800)    . dexmedetomidine Stopped (11/09/16 1700)  . insulin (NOVOLIN-R) infusion 3.1 Units/hr (11/10/16 1000)  . lactated ringers Stopped (11/10/16 0800)  . lactated ringers 20 mL/hr at 11/10/16 0700  . nitroGLYCERIN Stopped (11/10/16 1015)  . phenylephrine (NEO-SYNEPHRINE) Adult infusion Stopped (11/09/16 1530)   PRN Meds: sodium chloride, albuterol, ALPRAZolam, clonazePAM, lactated ringers, meclizine, metoprolol, midazolam, morphine injection, ondansetron (ZOFRAN) IV, oxyCODONE, sodium chloride flush, traMADol   Vital Signs    Vitals:   11/10/16 1015 11/10/16 1030 11/10/16 1034 11/10/16 1250  BP: 133/65 126/80    Pulse:      Resp: 14 17    Temp:    98.4 F (36.9 C)  TempSrc:    Oral  SpO2: 94% 98% 97%   Weight:      Height:        Intake/Output Summary (Last 24 hours) at 11/10/16 1343 Last data filed at 11/10/16 1015  Gross per 24 hour  Intake          1964.68 ml  Output             2520 ml  Net          -555.32 ml   [employer]  11/08/16 0607 11/09/16 0407 11/10/16 0500  Weight: 119.5 kg (263 lb 8 oz) 119.7 kg (263 lb 14.3 oz) 122.4 kg (269 lb 13.5 oz)    Physical Exam    GEN: Well nourished, well developed, in no acute distress.  HEENT: Grossly normal.  Neck: Supple, no JVD, carotid bruits, or masses. Cardiac: RRR, no murmurs, rubs, or gallops.  Extremities: No clubbing, cyanosis, edema.  Radials/DP/PT 2+ and equal bilaterally.  Respiratory:  Respirations regular and unlabored, clear to auscultation bilaterally. GI: Soft, nontender, nondistended, BS + x 4. MS: no deformity or atrophy. Skin: warm and dry, no rash. Neuro:  Strength and sensation are intact. Psych: AAOx3.  Normal affect.  Labs    CBC  Recent Labs  11/09/16 1843 11/09/16 1849 11/10/16 0420  WBC 9.3  --  12.4*  HGB 11.3* 11.6* 10.7*  HCT 33.2* 34.0* 31.6*  MCV 83.8  --  84.3  PLT 190  --  [id number]   Basic Metabolic Panel  Recent Labs  11/09/16 0220  11/09/16 1843 11/09/16 1849  11/10/16 0420  NA 137  < >  --  139 137  K 4.1  < >  --  4.2 4.5  CL 107  < >  --  103 106  CO2 24  --   --   --  24  GLUCOSE 81  < >  --  149* 113*  BUN 22*  < >  --  16 14  CREATININE 1.54*  < > 1.31* 1.20 1.48*  CALCIUM 9.0  --   --   --  8.0*  MG  --   --  2.4  --  2.0  < > = values in this interval not displayed. Liver Function Tests No results for input(s): AST, ALT, ALKPHOS, BILITOT, PROT, ALBUMIN in the last 72 hours. No results for input(s): LIPASE, AMYLASE in the last 72 hours. Cardiac Enzymes No results for input(s): CKTOTAL, CKMB, CKMBINDEX, TROPONINI in the last 72 hours. BNP Invalid input(s): POCBNP D-Dimer No results for input(s): DDIMER in the last 72 hours. Hemoglobin A1C No results for input(s): HGBA1C in the last 72 hours. Fasting Lipid Panel No results for input(s): CHOL, HDL, LDLCALC, TRIG, CHOLHDL, LDLDIRECT in the last 72 hours. Thyroid Function Tests No results for input(s): TSH, T4TOTAL, T3FREE, THYROIDAB in the last 72 hours.  Invalid input(s): FREET3  Telemetry    PVCs.  Occasional V pacing.  - Personally Reviewed  ECG    n/a - Personally Reviewed  Radiology    Dg Chest Port 1 View  Result Date: 11/10/2016 CLINICAL DATA:  76 year old male with chest pain. Status post CABG 11/09/2016 EXAM: PORTABLE CHEST 1 VIEW COMPARISON:  11/09/2016, 10/29/2016 FINDINGS: Right rotation of the patient. Cardiomediastinal silhouette relatively unchanged with accentuated central vasculature secondary to low lung volumes. Surgical changes of median sternotomy and CABG. Interval removal of the endotracheal tube and the gastric tube. Right IJ sheath remains, with Swan-Ganz catheter unchanged terminating in the proximal right pulmonary artery. Left thoracostomy tube/mediastinal drains and epicardial pacing leads project over the lower mediastinum. No visualized pneumothorax. Linear opacities of the left greater than right lung bases. IMPRESSION: Early postoperative  changes of CABG with interval extubation and removal of the gastric tube. Low lung volumes with likely basilar atelectasis. Unchanged thoracostomy tube/mediastinal drains with no visualized pneumothorax. Unchanged right IJ sheath and Swan-Ganz catheter. Signed, Dulcy Fanny. Earleen Newport, DO [facility] Radiology Electronically Signed   By: Corrie Mckusick D.O.  On: 11/10/2016 07:06   Dg Chest Port 1 View  Result Date: 11/09/2016 CLINICAL DATA:  Status post CABG. EXAM: PORTABLE CHEST 1 VIEW COMPARISON:  10/29/2016 FINDINGS: The endotracheal tube tip is above the carina. There is a Swan-Ganz catheter with tip in the main pulmonary artery. Bilateral chest tubes are identified. No pneumothorax identified Heart size is normal. There is atelectasis in both lung bases. IMPRESSION: 1. Postoperative appearance of the chest status post CABG procedure. 2. Bilateral chest tubes in place without pneumothorax. 3. Bibasilar atelectasis Electronically Signed   By: Kerby Moors M.D.   On: 11/09/2016 14:20    Cardiac Studies   11/02/16: Study Conclusions  - Left ventricle: The cavity size was normal. Wall thickness was   increased in a pattern of moderate LVH. Systolic function was   normal. The estimated ejection fraction was in the range of 60%   to 65%. Wall motion was normal; there were no regional wall   motion abnormalities. Doppler parameters are consistent with   abnormal left ventricular relaxation (grade 1 diastolic   dysfunction). - Aortic valve: There was no stenosis. - Mitral valve: Mildly calcified annulus. There was trivial   regurgitation. - Left atrium: The atrium was mildly dilated. - Right ventricle: The cavity size was mildly dilated. Systolic   function was normal. - Right atrium: The atrium was mildly dilated. - Tricuspid valve: Peak RV-RA gradient (S): 34 mm Hg. - Pulmonary arteries: PA peak pressure: 37 mm Hg (S). - Inferior vena cava: The  vessel was normal in size. The   respirophasic diameter changes were in the normal range (>= 50%),   consistent with normal central venous pressure.  Impressions:  - Normal LV size with moderate LV hypertrophy. EF 60-65%. Mildly   dilated RV with normal systolic function. No significant valvular   abnormalities. Mild pulmonary hypertension.  Patient Profile     [phone] with CAD, s/p PCI, hypertensive heart disease,hyperlipidemia, diabetes, and CKD III here with angina and found to have LM and two vessel disease on cath. Now s/p CABG.   Assessment & Plan    # Unstable angina: # S/P CABG:  Mr. Goedeke is doing well post-CABG.  He is doing well and off pressors.  Agree with resuming metoprolol.  Management per CT surgery.  # Hypertension: BP well-controlled on metoprolol.   # Chronic diastolic heart failure:  BP well-controlled.    # CKD III: Renal function stable.  Will continue to monitor.   # Hyperlipidemia: Continue atorvastatin and fenofibrate.    We will continue to follow peripherally.  Please feel free to call with questions.    Signed, Skeet Latch, MD  11/10/2016, 1:43 PM

## 2016-11-10 NOTE — Progress Notes (Signed)
CT Surgery Patient examined and record reviewed.Hemodynamics stable,labs satisfactory.Patient had stable day.Continue current care. Tharon Aquas Trigt III 11/10/2016

## 2016-11-11 ENCOUNTER — Inpatient Hospital Stay (HOSPITAL_COMMUNITY): Payer: Medicare Other

## 2016-11-11 LAB — BASIC METABOLIC PANEL
Anion gap: 7 (ref 5–15)
BUN: 11 mg/dL (ref 6–20)
CHLORIDE: 105 mmol/L (ref 101–111)
CO2: 27 mmol/L (ref 22–32)
CREATININE: 1.41 mg/dL — AB (ref 0.61–1.24)
Calcium: 8.2 mg/dL — ABNORMAL LOW (ref 8.9–10.3)
GFR calc Af Amer: 55 mL/min — ABNORMAL LOW (ref >60)
GFR calc non Af Amer: 48 mL/min — ABNORMAL LOW (ref >60)
GLUCOSE: 120 mg/dL — AB (ref 65–99)
POTASSIUM: 4 mmol/L (ref 3.5–5.1)
SODIUM: 139 mmol/L (ref 135–145)

## 2016-11-11 LAB — CBC
HEMATOCRIT: 30.3 % — AB (ref 39.0–52.0)
Hemoglobin: 9.9 g/dL — ABNORMAL LOW (ref 13.0–17.0)
MCH: 27.7 pg (ref 26.0–34.0)
MCHC: 32.7 g/dL (ref 30.0–36.0)
MCV: 84.9 fL (ref 78.0–100.0)
Platelets: 186 10*3/uL (ref 150–400)
RBC: 3.57 MIL/uL — ABNORMAL LOW (ref 4.22–5.81)
RDW: 13.5 % (ref 11.5–15.5)
WBC: 11.2 10*3/uL — AB (ref 4.0–10.5)

## 2016-11-11 LAB — GLUCOSE, CAPILLARY
GLUCOSE-CAPILLARY: 128 mg/dL — AB (ref 65–99)
GLUCOSE-CAPILLARY: 161 mg/dL — AB (ref 65–99)
GLUCOSE-CAPILLARY: 231 mg/dL — AB (ref 65–99)
Glucose-Capillary: 107 mg/dL — ABNORMAL HIGH (ref 65–99)
Glucose-Capillary: 163 mg/dL — ABNORMAL HIGH (ref 65–99)
Glucose-Capillary: 236 mg/dL — ABNORMAL HIGH (ref 65–99)

## 2016-11-11 MED ORDER — INSULIN ASPART 100 UNIT/ML ~~LOC~~ SOLN
0.0000 [IU] | Freq: Three times a day (TID) | SUBCUTANEOUS | Status: DC
  Administered 2016-11-11: 7 [IU] via SUBCUTANEOUS
  Administered 2016-11-11: 4 [IU] via SUBCUTANEOUS
  Administered 2016-11-11: 6 [IU] via SUBCUTANEOUS
  Administered 2016-11-12: 7 [IU] via SUBCUTANEOUS
  Administered 2016-11-12 – 2016-11-13 (×3): 4 [IU] via SUBCUTANEOUS
  Administered 2016-11-13: 7 [IU] via SUBCUTANEOUS
  Administered 2016-11-14: 3 [IU] via SUBCUTANEOUS
  Administered 2016-11-14: 11 [IU] via SUBCUTANEOUS
  Administered 2016-11-14: 4 [IU] via SUBCUTANEOUS
  Administered 2016-11-15: 7 [IU] via SUBCUTANEOUS

## 2016-11-11 MED ORDER — METOPROLOL TARTRATE 25 MG PO TABS: 25.0000 mg | ORAL_TABLET | Freq: Two times a day (BID) | ORAL | Status: DC

## 2016-11-11 MED ORDER — ZOLPIDEM TARTRATE 5 MG PO TABS: 5.0000 mg | ORAL_TABLET | Freq: Every evening | ORAL | Status: DC | PRN

## 2016-11-11 MED ORDER — MAGNESIUM HYDROXIDE 400 MG/5ML PO SUSP
30.0000 mL | Freq: Every day | ORAL | Status: DC | PRN
  Administered 2016-11-13: 30 mL via ORAL
  Filled 2016-11-11: qty 30

## 2016-11-11 MED ORDER — MOVING RIGHT ALONG BOOK
Freq: Once | Status: AC
  Administered 2016-11-11: 08:00:00
  Filled 2016-11-11: qty 1

## 2016-11-11 MED ORDER — ALUM & MAG HYDROXIDE-SIMETH 200-200-20 MG/5ML PO SUSP: 15.0000 mL | ORAL | Status: DC | PRN

## 2016-11-11 MED ORDER — AMLODIPINE BESYLATE 10 MG PO TABS: 10.0000 mg | ORAL_TABLET | Freq: Every day | ORAL | Status: DC

## 2016-11-11 MED ORDER — GUAIFENESIN-DM 100-10 MG/5ML PO SYRP: 15.0000 mL | ORAL_SOLUTION | ORAL | Status: DC | PRN

## 2016-11-11 MED ORDER — SODIUM CHLORIDE 0.9% FLUSH: 3.0000 mL | INTRAVENOUS | Status: DC | PRN

## 2016-11-11 MED ORDER — FUROSEMIDE 40 MG PO TABS: 40.0000 mg | ORAL_TABLET | Freq: Every day | ORAL | Status: DC

## 2016-11-11 MED ORDER — SODIUM CHLORIDE 0.9 % IV SOLN: 250.0000 mL | INTRAVENOUS | Status: DC | PRN

## 2016-11-11 MED ORDER — INSULIN DETEMIR 100 UNIT/ML ~~LOC~~ SOLN
50.0000 [IU] | Freq: Two times a day (BID) | SUBCUTANEOUS | Status: DC
Start: 2016-11-11 — End: 2016-11-14
  Administered 2016-11-11 – 2016-11-14 (×7): 50 [IU] via SUBCUTANEOUS
  Filled 2016-11-11 (×7): qty 0.5

## 2016-11-11 MED ORDER — POTASSIUM CHLORIDE CRYS ER 20 MEQ PO TBCR: 20.0000 meq | EXTENDED_RELEASE_TABLET | Freq: Every day | ORAL | Status: DC

## 2016-11-11 MED ORDER — SODIUM CHLORIDE 0.9% FLUSH
3.0000 mL | Freq: Two times a day (BID) | INTRAVENOUS | Status: DC
  Administered 2016-11-11 – 2016-11-14 (×8): 3 mL via INTRAVENOUS

## 2016-11-11 MED FILL — Sodium Bicarbonate IV Soln 8.4%: INTRAVENOUS | Qty: 50 | Status: AC

## 2016-11-11 MED FILL — Sodium Chloride IV Soln 0.9%: INTRAVENOUS | Qty: 2000 | Status: AC

## 2016-11-11 MED FILL — Heparin Sodium (Porcine) Inj 1000 Unit/ML: INTRAMUSCULAR | Qty: 10 | Status: AC

## 2016-11-11 MED FILL — Electrolyte-R (PH 7.4) Solution: INTRAVENOUS | Qty: 4000 | Status: AC

## 2016-11-11 MED FILL — Mannitol IV Soln 20%: INTRAVENOUS | Qty: 500 | Status: AC

## 2016-11-11 NOTE — Progress Notes (Signed)
Report to 2 W RN 

## 2016-11-11 NOTE — Care Management Note (Signed)
Case Management Note  Patient Details  Name: MIKAYEL BLOEMER MRN: [id number] Date of Birth: 04-28-1941  Subjective/Objective:   Pt lives alone, states his only support is a brother who is partially immobile.  Requests SNF rehab bed, CSW notified.                         Expected Discharge Plan:  Skilled Nursing Facility  In-House Referral:  Clinical Social Work  Discharge planning Services  CM Consult  Status of Service:  In process, will continue to follow  Girard Cooter, RN 11/11/2016, 10:45 AM

## 2016-11-11 NOTE — Progress Notes (Signed)
CARDIAC REHAB PHASE I   PRE:  Rate/Rhythm: 82 SR  BP:  Supine:   Sitting: 145/83  Standing:    SaO2: 98% 2L  MODE:  Ambulation: 150 ft   POST:  Rate/Rhythm: 87  BP:  Supine:   Sitting: 151/59  Standing:    SaO2: 97%2L 1340-1405 Pt walked 150 ft on 2L with rolling walker and asst x 2. Tolerated well. Pt happy to be up walking. To bed after walk. Can be asst x 1.   Graylon Good, RN BSN  11/11/2016 2:02 PM

## 2016-11-11 NOTE — Progress Notes (Signed)
1045 transferred to 2W05 via W[place], monitor and O2, placed in bed, SCD's with pt, RN to receive in room

## 2016-11-11 NOTE — Plan of Care (Signed)
Problem: Activity: Goal: Risk for activity intolerance will decrease Outcome: Progressing Pt walked 300 ftx1 today  Problem: Bowel/Gastric: Goal: Gastrointestinal status for postoperative course will improve Outcome: Progressing Active BS. Passing gas  Problem: Cardiac: Goal: Hemodynamic stability will improve Outcome: Progressing On no gtt's  Problem: Respiratory: Goal: Levels of oxygenation will improve Outcome: Progressing On 2L [place] Goal: Ability to tolerate decreased levels of ventilator support will improve Outcome: Completed/Met Date Met: 11/11/16 Extubated 11/09/2016  Problem: Pain Management: Goal: Pain level will decrease Outcome: Progressing Minimal c/o pain  Problem: Urinary Elimination: Goal: Ability to achieve and maintain adequate renal perfusion and functioning will improve Outcome: Progressing Adequate UOP

## 2016-11-11 NOTE — Progress Notes (Signed)
2 Days Post-Op Procedure(s) (LRB): CORONARY ARTERY BYPASS GRAFTING (CABG) x 2 (N/A) TRANSESOPHAGEAL ECHOCARDIOGRAM (TEE) (N/A) ENDOVEIN HARVEST OF GREATER SAPHENOUS VEIN (Left) Subjective: C/o irritation from Foley. No incisional pain  Objective: Vital signs in last 24 hours: Temp:  [97.7 F (36.5 C)-99.7 F (37.6 C)] 98.7 F (37.1 C) (01/12 0733) Pulse Rate:  [67-78] 76 (01/11 2102) Cardiac Rhythm: Normal sinus rhythm (01/12 0400) Resp:  [11-24] 18 (01/12 0700) BP: (105-178)/(46-144) 141/52 (01/12 0700) SpO2:  [86 %-100 %] 94 % (01/12 0700) Arterial Line BP: (134-216)/(47-62) 182/50 (01/11 1400) Weight:  [271 lb 6.2 oz (123.1 kg)] 271 lb 6.2 oz (123.1 kg) (01/12 0500)  Hemodynamic parameters for last 24 hours: PAP: (26)/(17) 26/17  Intake/Output from previous day: 01/11 0701 - 01/12 0700 In: 1506 [P.O.:760; I.V.:536; IV Piggyback:200] Out: 2045 [Urine:1995; Chest Tube:50] Intake/Output this shift: No intake/output data recorded.  General appearance: alert, cooperative and no distress Neurologic: intact Heart: regular rate and rhythm Lungs: clear to auscultation bilaterally Abdomen: normal findings: soft, non-tender  Lab Results:  Recent Labs  11/10/16 1613 11/11/16 0430  WBC 11.3* 11.2*  HGB 10.2* 9.9*  HCT 30.8* 30.3*  PLT 191 186   BMET:  Recent Labs  11/10/16 0420 11/10/16 1612 11/10/16 1613 11/11/16 0430  NA 137 136  --  139  K 4.5 4.1  --  4.0  CL 106 101  --  105  CO2 24  --   --  27  GLUCOSE 113* 221*  --  120*  BUN 14 16  --  11  CREATININE 1.48* 1.70* 1.63* 1.41*  CALCIUM 8.0*  --   --  8.2*    PT/INR:  Recent Labs  11/09/16 1330  LABPROT 16.7*  INR 1.35   ABG    Component Value Date/Time   PHART 7.341 (L) 11/09/2016 2003   HCO3 24.2 11/09/2016 2003   TCO2 24 11/10/2016 1612   ACIDBASEDEF 2.0 11/09/2016 2003   O2SAT 97.0 11/09/2016 2003   CBG (last 3)   Recent Labs  11/10/16 1940 11/10/16 2355 11/11/16 0408  GLUCAP 222*  161* 107*    Assessment/Plan: S/P Procedure(s) (LRB): CORONARY ARTERY BYPASS GRAFTING (CABG) x 2 (N/A) TRANSESOPHAGEAL ECHOCARDIOGRAM (TEE) (N/A) ENDOVEIN HARVEST OF GREATER SAPHENOUS VEIN (Left) Plan for transfer to step-down: see transfer orders   CV- stable in SR  RESP- continue IS  RENAL- creatinine below baseline at 1.41. Will start PO lasix  ENDO- CBG elevated yesterday evening, down this AM  Increase levemir, change to AC/HS  Ambulate  SCD + enoxaparin for DVT prophylaxis   LOS: 12 days    Melrose Nakayama 11/11/2016

## 2016-11-12 LAB — GLUCOSE, CAPILLARY
GLUCOSE-CAPILLARY: 155 mg/dL — AB (ref 65–99)
GLUCOSE-CAPILLARY: 192 mg/dL — AB (ref 65–99)
GLUCOSE-CAPILLARY: 228 mg/dL — AB (ref 65–99)
Glucose-Capillary: 179 mg/dL — ABNORMAL HIGH (ref 65–99)
Glucose-Capillary: 151 mg/dL — ABNORMAL HIGH (ref 65–99)

## 2016-11-12 LAB — CBC
HCT: 30 % — ABNORMAL LOW (ref 39.0–52.0)
Hemoglobin: 9.9 g/dL — ABNORMAL LOW (ref 13.0–17.0)
MCH: 28.1 pg (ref 26.0–34.0)
MCHC: 33 g/dL (ref 30.0–36.0)
MCV: 85.2 fL (ref 78.0–100.0)
Platelets: 216 10*3/uL (ref 150–400)
RBC: 3.52 MIL/uL — AB (ref 4.22–5.81)
RDW: 13.6 % (ref 11.5–15.5)
WBC: 9.8 10*3/uL (ref 4.0–10.5)

## 2016-11-12 LAB — BASIC METABOLIC PANEL
ANION GAP: 6 (ref 5–15)
BUN: 13 mg/dL (ref 6–20)
CO2: 28 mmol/L (ref 22–32)
Calcium: 8.4 mg/dL — ABNORMAL LOW (ref 8.9–10.3)
Chloride: 104 mmol/L (ref 101–111)
Creatinine, Ser: 1.4 mg/dL — ABNORMAL HIGH (ref 0.61–1.24)
GFR, EST AFRICAN AMERICAN: 56 mL/min — AB (ref >60)
GFR, EST NON AFRICAN AMERICAN: 48 mL/min — AB (ref >60)
Glucose, Bld: 191 mg/dL — ABNORMAL HIGH (ref 65–99)
POTASSIUM: 4 mmol/L (ref 3.5–5.1)
SODIUM: 138 mmol/L (ref 135–145)

## 2016-11-12 MED ORDER — FUROSEMIDE 40 MG PO TABS
40.0000 mg | ORAL_TABLET | Freq: Every day | ORAL | Status: AC
  Administered 2016-11-12 – 2016-11-14 (×3): 40 mg via ORAL
  Filled 2016-11-12 (×3): qty 1

## 2016-11-12 MED ORDER — POTASSIUM CHLORIDE CRYS ER 20 MEQ PO TBCR
20.0000 meq | EXTENDED_RELEASE_TABLET | Freq: Two times a day (BID) | ORAL | Status: AC
  Administered 2016-11-12 – 2016-11-14 (×6): 20 meq via ORAL
  Filled 2016-11-12 (×6): qty 1

## 2016-11-12 MED ORDER — INSULIN ASPART 100 UNIT/ML ~~LOC~~ SOLN
10.0000 [IU] | Freq: Three times a day (TID) | SUBCUTANEOUS | Status: DC
  Administered 2016-11-12 – 2016-11-15 (×9): 10 [IU] via SUBCUTANEOUS

## 2016-11-12 MED ORDER — METOPROLOL TARTRATE 25 MG PO TABS
25.0000 mg | ORAL_TABLET | Freq: Two times a day (BID) | ORAL | Status: DC
  Administered 2016-11-12 – 2016-11-15 (×7): 25 mg via ORAL
  Filled 2016-11-12 (×7): qty 1

## 2016-11-12 NOTE — Progress Notes (Signed)
CARDIAC REHAB PHASE I   PRE:  Rate/Rhythm: 84 SR  BP:  Supine:   Sitting: 132/70  Standing:    SaO2: 94% RA  MODE:  Ambulation: 320 ft   POST:  Rate/Rhythm: 94 SR  BP:  Supine:   Sitting: 144/80  Standing:    SaO2: 94 RA Tolerated ambulation well with assistance x 1, gait belt, and rolling walker.  States he is going to SNF upon discharge, referred to phase II cardiac rehab after he goes home.  [id number] Liliane Channel RN, BSN 11/12/2016 10:46 AM

## 2016-11-12 NOTE — Progress Notes (Signed)
11/12/2016 3:04 PM EPW D/C'd per order and per protocol.  Ends intact. Pt. Tolerated well.  Advised bedrest x1hr.  Call bell in reach.  Vital signs collected per protocol. [place]

## 2016-11-12 NOTE — Progress Notes (Signed)
  Patient is s/p CABG, management is per CT surgery at this time.   Carlyle Dolly MD         Carlyle Dolly, M.D., F.A.C.C.Patient ID: AGAMJOT MAZZARA, male   DOB: [birth year], 76 y.o.   MRN: [id number]

## 2016-11-12 NOTE — Progress Notes (Addendum)
      [address]       [place] [address]             [phone]      3 Days Post-Op Procedure(s) (LRB): CORONARY ARTERY BYPASS GRAFTING (CABG) x 2 (N/A) TRANSESOPHAGEAL ECHOCARDIOGRAM (TEE) (N/A) ENDOVEIN HARVEST OF GREATER SAPHENOUS VEIN (Left)   Subjective:  No complaints. States he feels pretty good.  Very pleasant  Objective: Vital signs in last 24 hours: Temp:  [98 F (36.7 C)-98.6 F (37 C)] 98.4 F (36.9 C) (01/13 0438) Pulse Rate:  [84-92] 84 (01/13 0438) Cardiac Rhythm: Normal sinus rhythm (01/13 0708) Resp:  [17-20] 20 (01/13 0438) BP: (144-161)/(50-61) 158/61 (01/13 0438) SpO2:  [95 %-98 %] 96 % (01/13 0438) Weight:  [272 lb 0.8 oz (123.4 kg)] 272 lb 0.8 oz (123.4 kg) (01/13 0438)  Intake/Output from previous day: 01/12 0701 - 01/13 0700 In: 790 [P.O.:720; I.V.:20] Out: 825 [Urine:825] Intake/Output this shift: Total I/O In: 3 [I.V.:3] Out: -   General appearance: alert, cooperative and no distress Heart: regular rate and rhythm Lungs: clear to auscultation bilaterally Abdomen: soft, non-tender; bowel sounds normal; no masses,  no organomegaly Extremities: edema trace Wound: clean and dry  Lab Results:  Recent Labs  11/11/16 0430 11/12/16 0407  WBC 11.2* 9.8  HGB 9.9* 9.9*  HCT 30.3* 30.0*  PLT 186 216   BMET:  Recent Labs  11/11/16 0430 11/12/16 0407  NA 139 138  K 4.0 4.0  CL 105 104  CO2 27 28  GLUCOSE 120* 191*  BUN 11 13  CREATININE 1.41* 1.40*  CALCIUM 8.2* 8.4*    PT/INR:  Recent Labs  11/09/16 1330  LABPROT 16.7*  INR 1.35   ABG    Component Value Date/Time   PHART 7.341 (L) 11/09/2016 2003   HCO3 24.2 11/09/2016 2003   TCO2 24 11/10/2016 1612   ACIDBASEDEF 2.0 11/09/2016 2003   O2SAT 97.0 11/09/2016 2003   CBG (last 3)   Recent Labs  11/12/16 0616 11/12/16 0841 11/12/16 1121  GLUCAP 155* 192* 179*    Assessment/Plan: S/P Procedure(s) (LRB): CORONARY ARTERY BYPASS GRAFTING (CABG)  x 2 (N/A) TRANSESOPHAGEAL ECHOCARDIOGRAM (TEE) (N/A) ENDOVEIN HARVEST OF GREATER SAPHENOUS VEIN (Left)  1. CV- NSR, HTN- will start Lopressor at 25 mg BID, will likely need to start ACE tomorrow 2. Pulm- no acute issues, continue IS 3. Renal- creatinine trending down, at 1.40, weight is stable 4. DM- sugars are high, continue Levemir, will increase meal coverage 5. Deconditioning- lives alone will need SNF at discharge 6. Dispo- patient stable, d/c EPW, start lopressor for BP/HR control, CSW consult placed   LOS: 13 days    Ahmed Prima, ERIN 11/12/2016   Chart reviewed, patient examined, agree with above. Weight is still 9 lbs over preop and with mild edema in lower legs will give some lasix. Creat stable and below baseline.

## 2016-11-13 DIAGNOSIS — Z951 Presence of aortocoronary bypass graft: Secondary | ICD-10-CM

## 2016-11-13 LAB — GLUCOSE, CAPILLARY
GLUCOSE-CAPILLARY: 94 mg/dL (ref 65–99)
GLUCOSE-CAPILLARY: 206 mg/dL — AB (ref 65–99)
Glucose-Capillary: 164 mg/dL — ABNORMAL HIGH (ref 65–99)
Glucose-Capillary: 116 mg/dL — ABNORMAL HIGH (ref 65–99)

## 2016-11-13 MED ORDER — LACTULOSE 10 GM/15ML PO SOLN
30.0000 g | Freq: Every day | ORAL | Status: DC | PRN
  Administered 2016-11-13 – 2016-11-14 (×2): 30 g via ORAL
  Filled 2016-11-13 (×2): qty 45

## 2016-11-13 MED ORDER — LISINOPRIL 10 MG PO TABS
10.0000 mg | ORAL_TABLET | Freq: Every day | ORAL | Status: DC
  Administered 2016-11-13 – 2016-11-15 (×3): 10 mg via ORAL
  Filled 2016-11-13 (×3): qty 1

## 2016-11-13 NOTE — Clinical Social Work Placement (Signed)
   CLINICAL SOCIAL WORK PLACEMENT  NOTE  Date:  11/13/2016  Patient Details  Name: LEMICHAEL KONIG MRN: [id number] Date of Birth: 12-11-1940  Clinical Social Work is seeking post-discharge placement for this patient at the [place] level of care (*CSW will initial, date and re-position this form in  chart as items are completed):  Yes   Patient/family provided with [facility] Work Department's list of facilities offering this level of care within the geographic area requested by the patient (or if unable, by the patient's family).  Yes   Patient/family informed of their freedom to choose among providers that offer the needed level of care, that participate in Medicare, Medicaid or managed care program needed by the patient, have an available bed and are willing to accept the patient.  Yes   Patient/family informed of [facility]'s ownership interest in [facility] and [facility], as well as of the fact that they are under no obligation to receive care at these facilities.  PASRR submitted to EDS on       PASRR number received on       Existing PASRR number confirmed on       FL2 transmitted to all facilities in geographic area requested by pt/family on       FL2 transmitted to all facilities within larger geographic area on       Patient informed that his/her managed care company has contracts with or will negotiate with certain facilities, including the following:            Patient/family informed of bed offers received.  Patient chooses bed at       Physician recommends and patient chooses bed at      Patient to be transferred to   on  .  Patient to be transferred to facility by       Patient family notified on   of transfer.  Name of family member notified:        PHYSICIAN Please sign FL2     Additional Comment:    _______________________________________________ Wende Neighbors, LCSW 11/13/2016, 1:37 PM

## 2016-11-13 NOTE — Discharge Summary (Signed)
Physician Discharge Summary  Patient ID: Edward [name] MRN: [id number] DOB/AGE: 12/13/1940 76 y.o.  Admit date: 10/29/2016 Discharge date: 11/15/2016  Admission Diagnoses: Coronary artery disease with unstable angina Patient Active Problem List   Diagnosis Date Noted  . Hypertensive heart disease without heart failure   . Chronic kidney disease, stage III (moderate) 11/03/2016  . Chronic diastolic HF (heart failure) ([place])   . Flank pain, acute 07/06/2016  . Dizziness   . Hypokalemia 04/19/2016  . Chest pain 04/19/2016  . Near syncope 04/19/2016  . Lightheaded 04/19/2016  . Type 2 diabetes mellitus with circulatory disorder, with long-term current use of insulin ([place]) 03/11/2016  . Atherosclerosis of native coronary artery of native heart with unstable angina pectoris ([place])   . Wheezing 11/30/2015  . GERD (gastroesophageal reflux disease) 08/27/2015  . Acute bronchitis 08/27/2015  . Dyspnea 07/08/2015  . OSA (obstructive sleep apnea) 06/22/2015  . Essential tremor 04/23/2015  . Rotator cuff arthropathy 07/30/2014  . CAP (community acquired pneumonia) 04/16/2014  . Severe obesity (BMI >= 40) ([place]) 02/11/2014  . Dyslipidemia, goal LDL below 70 01/29/2014  . HTN (hypertension) 01/29/2014   Discharge Diagnoses:   Patient Active Problem List   Diagnosis Date Noted  . 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 Hypertensive heart disease without heart failure   . Chronic kidney disease, stage III (moderate) 11/03/2016  . Chronic diastolic HF (heart failure) ([place])   . Flank pain, acute 07/06/2016  . Dizziness   . Hypokalemia 04/19/2016  . Chest pain 04/19/2016  . Near syncope 04/19/2016  . Lightheaded 04/19/2016  . Type 2 diabetes mellitus with circulatory disorder, with long-term current use of insulin ([place]) 03/11/2016  . Atherosclerosis of native coronary artery of native heart with unstable angina pectoris ([place])   . Wheezing 11/30/2015  . GERD (gastroesophageal reflux disease) 08/27/2015   . Acute bronchitis 08/27/2015  . Dyspnea 07/08/2015  . OSA (obstructive sleep apnea) 06/22/2015  . Essential tremor 04/23/2015  . Rotator cuff arthropathy 07/30/2014  . CAP (community acquired pneumonia) 04/16/2014  . Severe obesity (BMI >= 40) ([place]) 02/11/2014  . Dyslipidemia, goal LDL below 70 01/29/2014  . HTN (hypertension) 01/29/2014   Discharged Condition: good  History of Present Illness:   Edward [name] is a 76 yo AA male with known history of CAD currently being treated with medical therapy.  He also has known Type II DM, CKD Stage II, Hypertension, and Hyperlipidemia.  He presented to the ED with complains of worsening chest pain with exertion.  This was also associated with shortness of breath.  On the day of presentation the patient developed pain in his upper should which was sharp and radiated to his arm with left chest pressure.  He states the pain was unbearable.  He states he took a second dose of his Ranexa which did not alleviate his pain and he therefore called EMS.    Hospital Course:   Upon arrival to the ED his pain was better with use of Morphine.  EKG was unremarkable and his Troponin was normal.  He was admitted for further observation.  The patient suffered further chest pain when his IV NTG was discontinued.  This resolved with resumption of medication.  He was taken to the cath lab on 11/02/2016 and was found to have severe 2 vessel CAD with LM involvement.  It was felt coronary bypass grafting would be indicated and TCTS consult was obtained.  He was evaluated by Edward [name] was in agreement the  patient would require CABG after Plavix washout.  The risks and benefits of the procedure were explained to the patient and he was agreeable to proceed.  He was taken to the operating room on 11/09/2016.  He underwent CABG x 2 utilizing LIMA to LAD and SVG to OM.  He also underwent endoscopic harvest of his greater saphenous vein from his left thigh.  His right leg was  explored but the saphenous vein was not located.  The patient tolerated the procedure and was taken to the SICU in stable condition.  The patient was extubated the evening of surgery.  During his stay in the SICU the patient's chest tubes and arterial lines were removed without difficulty.  He was weaned off NTG drip as BP allowed.  He was maintaining NSR and felt stable for transfer to the telemetry unit for further convalescence on POD #2.  The patient continues to make progress.  His creatinine has recovered and is below his baseline.  He has responded well to diuretics.  He was mildly tachycardic with some hypertension.  He was started on a beta blocker and ace inhibitor which were titrated as tolerated.  His blood sugars are well controlled.  He continues to maintain NSR and his pacing wires have been removed.  He is mildly deconditioned and has decided on SNF for discharge.  He is ambulating with assistance.  Per Edward [name], will decrease ecasa to 81 mg daily and restart Effient 10 mg daily. He has been weaned off oxygen and is medically stable for discharge to SNF today.      We ask the SNF to please do the following: 1. Please obtain vital signs at least one time daily 2.Please weigh the patient daily. If he or she continues to gain weight or develops lower extremity edema, contact the office at [phone]. 3. Ambulate patient at least three times daily and please use sternal precautions.  Significant Diagnostic Studies: angiography:   1. Significant LMCA (60%) and ostial LCx (85%) stenoses.  Eccentric mid LMCA stenosis appears somewhat worse than prior angiogram in 12/2015.  Lesion was borderline significant at that time by IVUS. 2. Patent mid LAD stent with mild to moderate proximal LAD, LCx/OM, and RCA disease. 3. Mildly elevated left ventricular filling pressure.   Treatments: surgery:    Median sternotomy, extracorporeal circulation, coronary artery bypass grafting x2 (left  internal mammary artery to left anterior descending, saphenous vein graft to obtuse marginal 1), endoscopic vein harvest, left thigh.  Disposition: 01-Home or Self Care    Discharge Instructions    Amb Referral to Cardiac Rehabilitation    Complete by:  As directed    Diagnosis:  CABG   CABG X ___:  2        Discharge Medications:  Allergies as of 11/15/2016      Reactions   No Known Allergies       Medication List    STOP taking these medications   insulin NPH-regular Human (70-30) 100 UNIT/ML injection Commonly known as:  NOVOLIN 70/30   isosorbide mononitrate 60 MG 24 hr tablet Commonly known as:  IMDUR   nitroGLYCERIN 0.4 MG SL tablet Commonly known as:  NITROSTAT   propranolol 40 MG tablet Commonly known as:  INDERAL   ranolazine 1000 MG SR tablet Commonly known as:  RANEXA     TAKE these medications   amLODipine 10 MG tablet Commonly known as:  NORVASC Take 1 tablet (10 mg total) by mouth daily.  aspirin EC 81 MG tablet Take 81 mg by mouth daily.   atorvastatin 80 MG tablet Commonly known as:  LIPITOR Take 1 tablet (80 mg total) by mouth daily. What changed:  medication strength  how much to take   clonazePAM 0.5 MG tablet Commonly known as:  KLONOPIN Take 1 tablet (0.5 mg total) by mouth 2 (two) times daily as needed (tremors).   diclofenac sodium 1 % Gel Commonly known as:  VOLTAREN Apply 2 g topically 4 (four) times daily as needed (for pain).   dicyclomine 10 MG capsule Commonly known as:  BENTYL Take 1 capsule (10 mg total) by mouth 3 (three) times daily before meals.   EFFIENT 10 MG Tabs tablet Generic drug:  prasugrel take 1 tablet by mouth once daily   fenofibrate 160 MG tablet Take 1 tablet (160 mg total) by mouth daily.   fluticasone furoate-vilanterol 100-25 MCG/INH Aepb Commonly known as:  BREO ELLIPTA Inhale 1 puff into the lungs daily.   furosemide 40 MG tablet Commonly known as:  LASIX Take 1 tablet (40 mg total) by  mouth daily as needed for fluid.   gabapentin 300 MG capsule Commonly known as:  NEURONTIN Take 1 tablet at bedtime for one week then increase to 1 tablet twice daily What changed:  how much to take  how to take this  when to take this  additional instructions   insulin detemir 100 UNIT/ML injection Commonly known as:  LEVEMIR Inject 0.7 mLs (70 Units total) into the skin 2 (two) times daily.   lisinopril 10 MG tablet Commonly known as:  PRINIVIL,ZESTRIL Take 10 mg by mouth daily.   meclizine 25 MG tablet Commonly known as:  ANTIVERT Take 1 tablet (25 mg total) by mouth 3 (three) times daily as needed for dizziness (vertigo).   metoprolol tartrate 25 MG tablet Commonly known as:  LOPRESSOR Take 1 tablet (25 mg total) by mouth 2 (two) times daily. What changed:  medication strength  how much to take   NOVOLIN R 250 units/2.30mL (100 units/mL) injection Generic drug:  insulin regular inject 20-36 units subcutaneously three times a day before meals per sliding scale   oxyCODONE 5 MG immediate release tablet Commonly known as:  Oxy IR/ROXICODONE Take 5 mg by mouth every 4-6 hours PRN severe pain.   pantoprazole 40 MG tablet Commonly known as:  PROTONIX Take 40 mg by mouth daily.   potassium chloride SA 20 MEQ tablet Commonly known as:  K-DUR,KLOR-CON Take 1 tablet (20 mEq total) by mouth daily as needed. Only take if takes Lasix 40 mg daily. What changed:  when to take this  reasons to take this  additional instructions   REFRESH OP Place 1 drop into both eyes daily as needed (for dry eyes). Reported on 12/25/2015   VENTOLIN HFA 108 (90 Base) MCG/ACT inhaler Generic drug:  albuterol Inhale 2 puffs into the lungs every 6 (six) hours as needed for wheezing or shortness of breath.      The patient has been discharged on:   1.Beta Blocker:  Yes [  x ]                              No   [   ]                              If No, reason:  2.Ace  Inhibitor/ARB: Yes [ x  ]                                     No  [    ]                                     If No, reason:  3.Statin:   Yes [x   ]                  No  [   ]                  If No, reason:  4.Ecasa:  Yes  [ x  ]                  No   [   ]                  If No, reason:     Follow-up Information    Melrose Nakayama, MD Follow up in 4 week(s).   Specialty:  Cardiothoracic Surgery Why:  Office will contact you with appointment date and time.  Please get CXR 30 min prior to your appointment at [facility] imaging located on first floor of our office building  Contact information: [address] [place] [address] Regent III, MD Follow up.   Specialty:  Cardiology Contact information: [id number] [place] [address] [phone]        Ann Held, DO Follow up.   Specialty:  Family Medicine Why:  Call for an appointment for furhter diabetes management and surveillance of [facility] Contact information: [address] [place] [address] [phone]           Signed: Caryn Bee 11/15/2016, 7:53 AM

## 2016-11-13 NOTE — Progress Notes (Addendum)
      [address]       [place] [address]             [phone]      4 Days Post-Op Procedure(s) (LRB): CORONARY ARTERY BYPASS GRAFTING (CABG) x 2 (N/A) TRANSESOPHAGEAL ECHOCARDIOGRAM (TEE) (N/A) ENDOVEIN HARVEST OF GREATER SAPHENOUS VEIN (Left)   Subjective:  No complaints.  States he is doing very well.  He is very happy with how his care has been during his hospitalization.  Objective: Vital signs in last 24 hours: Temp:  [98 F (36.7 C)-98.6 F (37 C)] 98 F (36.7 C) (01/14 0459) Pulse Rate:  [71-83] 72 (01/14 0459) Cardiac Rhythm: Normal sinus rhythm (01/14 0702) Resp:  [18] 18 (01/14 0459) BP: (121-167)/(52-89) 159/63 (01/14 0459) SpO2:  [92 %-98 %] 98 % (01/14 0459) Weight:  [261 lb 6.4 oz (118.6 kg)] 261 lb 6.4 oz (118.6 kg) (01/14 0459)  Intake/Output from previous day: 01/13 0701 - 01/14 0700 In: 3 [I.V.:3] Out: 1100 [Urine:1100]  General appearance: alert, cooperative and no distress Heart: regular rate and rhythm Lungs: clear to auscultation bilaterally Abdomen: soft, non-tender; bowel sounds normal; no masses,  no organomegaly Extremities: extremities normal, atraumatic, no cyanosis or edema Wound: clean and dry  Lab Results:  Recent Labs  11/11/16 0430 11/12/16 0407  WBC 11.2* 9.8  HGB 9.9* 9.9*  HCT 30.3* 30.0*  PLT 186 216   BMET:  Recent Labs  11/11/16 0430 11/12/16 0407  NA 139 138  K 4.0 4.0  CL 105 104  CO2 27 28  GLUCOSE 120* 191*  BUN 11 13  CREATININE 1.41* 1.40*  CALCIUM 8.2* 8.4*    PT/INR: No results for input(s): LABPROT, INR in the last 72 hours. ABG    Component Value Date/Time   PHART 7.341 (L) 11/09/2016 2003   HCO3 24.2 11/09/2016 2003   TCO2 24 11/10/2016 1612   ACIDBASEDEF 2.0 11/09/2016 2003   O2SAT 97.0 11/09/2016 2003   CBG (last 3)   Recent Labs  11/12/16 1613 11/12/16 2125 11/13/16 0620  GLUCAP 228* 151* 94    Assessment/Plan: S/P Procedure(s) (LRB): CORONARY ARTERY  BYPASS GRAFTING (CABG) x 2 (N/A) TRANSESOPHAGEAL ECHOCARDIOGRAM (TEE) (N/A) ENDOVEIN HARVEST OF GREATER SAPHENOUS VEIN (Left)  1. CV- NSR, + HTN- continue Lopressor, restart home Lisinopril 2. Pulm- no acute issues, continue IS 3. Renal- creatinine below baseline, weight is coming down, continue Lasix 4. DM- sugars are improved with increase in meal coverage, continue current insulin regimen 5. Deconditioning- lives alone, CSW consult placed, will need SNF 6. Dispo- patient stable, hypertensive will restart home Lisinopril, continue diuretics, to SNF when bed is available   LOS: 14 days    Ellwood Handler 11/13/2016   Chart reviewed, patient examined, agree with above. He is continuing to progress and is ready to go to SNF when bed available.

## 2016-11-13 NOTE — NC FL2 (Signed)
[place] LEVEL OF CARE SCREENING TOOL     IDENTIFICATION  Patient Name: Edward [name] Birthdate: [birth year] Sex: male Admission Date (Current Location): 10/29/2016  [facility] and [place] Number:  Herbalist and Address:  [facility], [place] [address], [place], [place] [address]      Provider Number: [id number]  Attending Physician Name and Address:  Herminio Commons, MD  Relative Name and Phone Number:  Dalin Aagard, [phone]    Current Level of Care: Hospital Recommended Level of Care: [place] Prior Approval Number:    Date Approved/Denied:   PASRR Number: [id number]  Discharge Plan: SNF    Current Diagnoses: Patient Active Problem List   Diagnosis Date Noted  . 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 Hypertensive heart disease without heart failure   . Chronic kidney disease, stage III (moderate) 11/03/2016  . Chronic diastolic HF (heart failure) ([place])   . Flank pain, acute 07/06/2016  . Dizziness   . Hypokalemia 04/19/2016  . Chest pain 04/19/2016  . Near syncope 04/19/2016  . Lightheaded 04/19/2016  . Type 2 diabetes mellitus with circulatory disorder, with long-term current use of insulin ([place]) 03/11/2016  . Atherosclerosis of native coronary artery of native heart with unstable angina pectoris ([place])   . Wheezing 11/30/2015  . GERD (gastroesophageal reflux disease) 08/27/2015  . Acute bronchitis 08/27/2015  . Dyspnea 07/08/2015  . OSA (obstructive sleep apnea) 06/22/2015  . Essential tremor 04/23/2015  . Rotator cuff arthropathy 07/30/2014  . CAP (community acquired pneumonia) 04/16/2014  . Severe obesity (BMI >= 40) ([place]) 02/11/2014  . Dyslipidemia, goal LDL below 70 01/29/2014  . HTN (hypertension) 01/29/2014    Orientation RESPIRATION BLADDER Height & Weight     Self, Time, Situation, Place  Normal Continent Weight: 261 lb 6.4 oz (118.6 kg) Height:  5\' 8"  (172.7 cm)  BEHAVIORAL  SYMPTOMS/MOOD NEUROLOGICAL BOWEL NUTRITION STATUS      Continent    AMBULATORY STATUS COMMUNICATION OF NEEDS Skin   Limited Assist Verbally Normal                       Personal Care Assistance Level of Assistance  Bathing, Feeding, Dressing Bathing Assistance: Limited assistance Feeding assistance: Independent Dressing Assistance: Limited assistance     Functional Limitations Info  Sight, Hearing, Speech Sight Info: Adequate Hearing Info: Adequate Speech Info: Adequate    SPECIAL CARE FACTORS FREQUENCY  PT (By licensed PT), OT (By licensed OT)     PT Frequency: 5x week OT Frequency: 5x week            Contractures Contractures Info: Not present    Additional Factors Info  Code Status Code Status Info: Full Code             Current Medications (11/13/2016):  This is the current hospital active medication list Current Facility-Administered Medications  Medication Dose Route Frequency Provider Last Rate Last Dose  . 0.9 %  sodium chloride infusion  250 mL Intravenous PRN Melrose Nakayama, MD      . acetaminophen (TYLENOL) tablet 1,000 mg  1,000 mg Oral Q6H Erin R Barrett, PA-C   1,000 mg at 11/13/16 1248   Or  . acetaminophen (TYLENOL) solution 1,000 mg  1,000 mg Per Tube Q6H Erin R Barrett, PA-C   1,000 mg at 11/09/16 2308  . albuterol (PROVENTIL) (2.5 MG/3ML) 0.083% nebulizer solution 2.5 mg  2.5 mg Inhalation Q6H PRN  Dayna N Dunn, PA-C      . alum & mag hydroxide-simeth (MAALOX/MYLANTA) 200-200-20 MG/5ML suspension 15-30 mL  15-30 mL Oral Q4H PRN Melrose Nakayama, MD      . aspirin EC tablet 325 mg  325 mg Oral Daily Erin R Barrett, PA-C   325 mg at 11/13/16 1003   Or  . aspirin chewable tablet 324 mg  324 mg Per Tube Daily Erin R Barrett, PA-C      . atorvastatin (LIPITOR) tablet 80 mg  80 mg Oral q1800 Dayna N Dunn, PA-C   80 mg at 11/12/16 1800  . bisacodyl (DULCOLAX) EC tablet 10 mg  10 mg Oral Daily Erin R Barrett, PA-C   10 mg at 11/13/16 1003    Or  . bisacodyl (DULCOLAX) suppository 10 mg  10 mg Rectal Daily Erin R Barrett, PA-C      . clonazePAM (KLONOPIN) tablet 0.5 mg  0.5 mg Oral BID PRN Dayna N Dunn, PA-C   0.5 mg at 11/10/16 2102  . dicyclomine (BENTYL) capsule 10 mg  10 mg Oral TID AC Dayna N Dunn, PA-C   10 mg at 11/13/16 1248  . docusate sodium (COLACE) capsule 200 mg  200 mg Oral Daily Erin R Barrett, PA-C   200 mg at 11/13/16 1003  . enoxaparin (LOVENOX) injection 40 mg  40 mg Subcutaneous QHS Melrose Nakayama, MD   40 mg at 11/12/16 2144  . fenofibrate tablet 160 mg  160 mg Oral Daily Dayna N Dunn, PA-C   160 mg at 11/13/16 1004  . fluticasone furoate-vilanterol (BREO ELLIPTA) 100-25 MCG/INH 1 puff  1 puff Inhalation Daily Dayna N Dunn, PA-C   1 puff at 11/11/16 [id number]  . furosemide (LASIX) tablet 40 mg  40 mg Oral Daily Gaye Pollack, MD   40 mg at 11/13/16 1003  . gabapentin (NEURONTIN) capsule 300 mg  300 mg Oral BID Dayna N Dunn, PA-C   300 mg at 11/13/16 1004  . guaiFENesin-dextromethorphan (ROBITUSSIN DM) 100-10 MG/5ML syrup 15 mL  15 mL Oral Q4H PRN Melrose Nakayama, MD      . insulin aspart (novoLOG) injection 0-20 Units  0-20 Units Subcutaneous TID WC Melrose Nakayama, MD   7 Units at 11/13/16 1249  . insulin aspart (novoLOG) injection 10 Units  10 Units Subcutaneous TID WC Erin R Barrett, PA-C   10 Units at 11/13/16 1249  . insulin detemir (LEVEMIR) injection 50 Units  50 Units Subcutaneous BID Melrose Nakayama, MD   50 Units at 11/13/16 1004  . lactulose (CHRONULAC) 10 GM/15ML solution 30 g  30 g Oral Daily PRN Erin R Barrett, PA-C      . lisinopril (PRINIVIL,ZESTRIL) tablet 10 mg  10 mg Oral Daily Erin R Barrett, PA-C   10 mg at 11/13/16 1005  . magnesium hydroxide (MILK OF MAGNESIA) suspension 30 mL  30 mL Oral Daily PRN Melrose Nakayama, MD   30 mL at 11/13/16 1003  . meclizine (ANTIVERT) tablet 25 mg  25 mg Oral TID PRN Dayna N Dunn, PA-C   25 mg at 10/30/16 1600  . MEDLINE mouth rinse  15  mL Mouth Rinse BID Melrose Nakayama, MD   15 mL at 11/13/16 1000  . metoprolol tartrate (LOPRESSOR) tablet 25 mg  25 mg Oral BID Erin R Barrett, PA-C   25 mg at 11/13/16 1004  . ondansetron (ZOFRAN) injection 4 mg  4 mg Intravenous Q6H PRN Erin R Barrett, PA-C  4 mg at 11/09/16 1958  . oxyCODONE (Oxy IR/ROXICODONE) immediate release tablet 5-10 mg  5-10 mg Oral Q3H PRN Erin R Barrett, PA-C   10 mg at 11/13/16 0857  . pantoprazole (PROTONIX) EC tablet 40 mg  40 mg Oral Daily Erin R Barrett, PA-C   40 mg at 11/13/16 1003  . potassium chloride SA (K-DUR,KLOR-CON) CR tablet 20 mEq  20 mEq Oral BID Gaye Pollack, MD   20 mEq at 11/13/16 1003  . sodium chloride flush (NS) 0.9 % injection 3 mL  3 mL Intravenous Q12H Melrose Nakayama, MD   3 mL at 11/13/16 1004  . sodium chloride flush (NS) 0.9 % injection 3 mL  3 mL Intravenous PRN Melrose Nakayama, MD      . zolpidem [facility]) tablet 5 mg  5 mg Oral QHS PRN Melrose Nakayama, MD         Discharge Medications: Please see discharge summary for a list of discharge medications.  Relevant Imaging Results:  Relevant Lab Results:   Additional Information [phone]  Wende Neighbors, LCSW

## 2016-11-13 NOTE — Clinical Social Work Note (Signed)
Clinical Social Work Assessment  Patient Details  Name: Edward [name] MRN: [id number] Date of Birth: 10/20/1941  Date of referral:  11/13/16               Reason for consult:  Discharge Planning                Permission sought to share information with:  Family Supports Permission granted to share information::  Yes, Verbal Permission Granted  Name::     Edward [name]  Agency::     Relationship::  brother  Contact Information:  [phone]  Housing/Transportation Living arrangements for the past 2 months:  Single Family Home Source of Information:  Patient Patient Interpreter Needed:  None Criminal Activity/Legal Involvement Pertinent to Current Situation/Hospitalization:  No - Comment as needed Significant Relationships:  Siblings Lives with:    Do you feel safe going back to the place where you live?  Yes Need for family participation in patient care:  No (Coment)  Care giving concerns: No family at bedside  Social Worker assessment / plan:  Clinical Social Worker met patient at discharge to offer support and discuss patients needs at discharge. Patient stated he is originally from [place] and has moved to [place] 2 years ago to be closer to his brother and take care of him. Patient stated that he has a huge church family and brother is supportive of him. Patient is agreeable to go to SNF but would prefer [facility] for his facility. CSW to complete necessary paperwork and initiate SNF search on patient behalf.CSW to follow up with patient once bed offers are available. CSW remains available for support and to facilitate discharge needs once medically ready.   Employment status:    Insurance information:  Medicare PT Recommendations:    Information / Referral to community resources:  [place]  Patient/Family's Response to care:  Patient verbalized appreciation and understanding  For CSW role and involvement in care. Patient agreeable with current discharge plant to SNF.    Patient/Family's Understanding of and Emotional Response to Diagnosis, Current Treatment, and Prognosis:  Patient with good understanding of current medical state and limitations around most recent hospitalization. Patient agreeable with SNF placement in hopes of transitioning back home.  Emotional Assessment Appearance:  Appears stated age Attitude/Demeanor/Rapport:  Other Affect (typically observed):  Pleasant Orientation:  Oriented to Self, Oriented to Place, Oriented to  Time, Oriented to Situation Alcohol / Substance use:  Not Applicable Psych involvement (Current and /or in the community):  No (Comment)  Discharge Needs  Concerns to be addressed:  No discharge needs identified Readmission within the last 30 days:  No Current discharge risk:  None Barriers to Discharge:  No Barriers Identified   Wende Neighbors, LCSW 11/13/2016, 1:30 PM

## 2016-11-13 NOTE — Progress Notes (Signed)
Management per CT surgery at this time.   Carlyle Dolly MD

## 2016-11-14 LAB — GLUCOSE, CAPILLARY
GLUCOSE-CAPILLARY: 271 mg/dL — AB (ref 65–99)
GLUCOSE-CAPILLARY: 196 mg/dL — AB (ref 65–99)
Glucose-Capillary: 186 mg/dL — ABNORMAL HIGH (ref 65–99)
Glucose-Capillary: 209 mg/dL — ABNORMAL HIGH (ref 65–99)
Glucose-Capillary: 212 mg/dL — ABNORMAL HIGH (ref 65–99)
Glucose-Capillary: 123 mg/dL — ABNORMAL HIGH (ref 65–99)

## 2016-11-14 MED ORDER — OXYCODONE HCL 5 MG PO TABS: ORAL_TABLET | ORAL | 0 refills | Status: DC

## 2016-11-14 MED ORDER — ASPIRIN 325 MG PO TBEC: 325.0000 mg | DELAYED_RELEASE_TABLET | Freq: Every day | ORAL | 0 refills | Status: DC

## 2016-11-14 MED ORDER — METOPROLOL TARTRATE 25 MG PO TABS: 25.0000 mg | ORAL_TABLET | Freq: Two times a day (BID) | ORAL | Status: DC

## 2016-11-14 MED ORDER — POTASSIUM CHLORIDE CRYS ER 20 MEQ PO TBCR: 20.0000 meq | EXTENDED_RELEASE_TABLET | Freq: Every day | ORAL | 11 refills | Status: DC | PRN

## 2016-11-14 MED ORDER — INSULIN DETEMIR 100 UNIT/ML ~~LOC~~ SOLN
70.0000 [IU] | Freq: Two times a day (BID) | SUBCUTANEOUS | Status: DC
  Administered 2016-11-14 – 2016-11-15 (×2): 70 [IU] via SUBCUTANEOUS
  Filled 2016-11-14 (×2): qty 0.7

## 2016-11-14 MED ORDER — FUROSEMIDE 40 MG PO TABS: 40.0000 mg | ORAL_TABLET | Freq: Every day | ORAL | Status: DC | PRN

## 2016-11-14 MED ORDER — PRASUGREL HCL 10 MG PO TABS
10.0000 mg | ORAL_TABLET | Freq: Every day | ORAL | Status: DC
  Filled 2016-11-14: qty 1

## 2016-11-14 MED ORDER — ATORVASTATIN CALCIUM 80 MG PO TABS: 80.0000 mg | ORAL_TABLET | Freq: Every day | ORAL | Status: DC

## 2016-11-14 MED ORDER — PRASUGREL HCL 10 MG PO TABS
10.0000 mg | ORAL_TABLET | Freq: Every day | ORAL | Status: DC
  Administered 2016-11-15: 10 mg via ORAL
  Filled 2016-11-14: qty 1

## 2016-11-14 MED ORDER — AMLODIPINE BESYLATE 10 MG PO TABS
10.0000 mg | ORAL_TABLET | Freq: Every day | ORAL | Status: DC
  Administered 2016-11-14 – 2016-11-15 (×2): 10 mg via ORAL
  Filled 2016-11-14 (×2): qty 1

## 2016-11-14 MED ORDER — ASPIRIN EC 81 MG PO TBEC
81.0000 mg | DELAYED_RELEASE_TABLET | Freq: Every day | ORAL | Status: DC
  Administered 2016-11-15: 81 mg via ORAL
  Filled 2016-11-14: qty 1

## 2016-11-14 MED ORDER — INSULIN DETEMIR 100 UNIT/ML ~~LOC~~ SOLN: 70.0000 [IU] | Freq: Two times a day (BID) | SUBCUTANEOUS | 11 refills | Status: DC

## 2016-11-14 NOTE — Progress Notes (Signed)
    Message sent to arrange hospital follow up in the office.   Reino Bellis NP-C

## 2016-11-14 NOTE — Progress Notes (Addendum)
      [address]       [place], [address]             [phone]        5 Days Post-Op Procedure(s) (LRB): CORONARY ARTERY BYPASS GRAFTING (CABG) x 2 (N/A) TRANSESOPHAGEAL ECHOCARDIOGRAM (TEE) (N/A) ENDOVEIN HARVEST OF GREATER SAPHENOUS VEIN (Left)  Subjective: Patient just finished walking with PT.  Objective: Vital signs in last 24 hours: Temp:  [97.6 F (36.4 C)-98 F (36.7 C)] 97.7 F (36.5 C) (01/15 0505) Pulse Rate:  [72-82] 72 (01/15 0505) Cardiac Rhythm: Heart block (01/15 0703) Resp:  [18] 18 (01/15 0505) BP: (146-162)/(52-60) 150/60 (01/15 0505) SpO2:  [98 %-100 %] 100 % (01/15 0505) Weight:  [260 lb 11.2 oz (118.3 kg)] 260 lb 11.2 oz (118.3 kg) (01/15 0505)  Pre op weight 119 kg Current Weight  11/14/16 260 lb 11.2 oz (118.3 kg)       Intake/Output from previous day: 01/14 0701 - 01/15 0700 In: 3 [I.V.:3] Out: 575 [Urine:575]   Physical Exam:  Cardiovascular: RRR Pulmonary: Slightly diminished at bases Abdomen: Soft, non tender, bowel sounds present. Extremities: Trace lower extremity edema. Wounds: Clean and dry.  No erythema or signs of infection.  Lab Results: CBC: Recent Labs  11/12/16 0407  WBC 9.8  HGB 9.9*  HCT 30.0*  PLT 216   BMET:  Recent Labs  11/12/16 0407  NA 138  K 4.0  CL 104  CO2 28  GLUCOSE 191*  BUN 13  CREATININE 1.40*  CALCIUM 8.4*    PT/INR:  Lab Results  Component Value Date   INR 1.35 11/09/2016   INR 1.07 10/29/2016   INR 0.98 10/29/2016   ABG:  INR: Will add last result for INR, ABG once components are confirmed Will add last 4 CBG results once components are confirmed  Assessment/Plan:  1. 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 On Lopressor 25 mg bid and Lisinopril 10 mg daily. SBP still in 140's+ so will restart Norvasc for better BP control. He was on Effient pre op-will defer to Dr. Roxan Hockey if need to restart. 2.  Pulmonary - On room air. Encourage incentive  spirometer. 3. Volume Overload - On Lasix 40 mg daily. Pre op he took Lasix 40 mg daily PRN fluid retention so will continue PRN. 4.  Acute blood loss anemia - Last H and H 9.9 and 30. 5. DM-CBGs 164/116/186. On Insulin. 6. Remove sutures 7. To SNF when bed available  ZIMMERMAN,DONIELLE MPA-C 11/14/2016,7:42 AM Patient seen and examined, agree with above Ready for SNF Will go ahead and resume Effient  Remo Lipps C. Roxan Hockey, MD [facility] [phone]

## 2016-11-14 NOTE — Progress Notes (Signed)
CARDIAC REHAB PHASE I   Pt just returned from ambulating with PT, declines additional ambulation. Cardiac surgery discharge education completed. Reviewed IS, sternal precautions, activity progression, exercise, heart healthy and diabetes diet handouts, daily weights, and phase 2 cardiac rehab. Pt verbalized understanding, receptive to education.  Pt agrees to phase 2 cardiac rehab referral, referral was sent to [facility] per pt request. Pt in recliner, call bell within reach.   [id number] Lenna Sciara, RN, BSN 11/14/2016 8:49 AM

## 2016-11-14 NOTE — Evaluation (Signed)
Physical Therapy Evaluation Patient Details Name: Edward [name] MRN: [id number] DOB: 09-Jan-1941 Today's Date: 11/14/2016   History of Present Illness  76 yo admitted with CP s/p CABGx2. PMHx: CAD,DM, CLD, HTN, HLD, tremor   Clinical Impression  Pt very pleasant and positive outlook in general. Pt unaware of sternal precautions and educated for all. Pt with decreased strength, functional mobility and gait without available caregiver at home and will benefit from ST-SNF as well as acute therapy to maximize independence and mobility to return pt to PLOF while adhering to precautions.   HR 79-92    Follow Up Recommendations SNF;Supervision for mobility/OOB    Equipment Recommendations  Rolling walker with 5" wheels;3in1 (PT)    Recommendations for Other Services       Precautions / Restrictions Precautions Precautions: Fall;Sternal      Mobility  Bed Mobility Overal bed mobility: Needs Assistance Bed Mobility: Supine to Sit;Sit to Supine     Supine to sit: Min assist Sit to supine: Min assist   General bed mobility comments: cues for sequence with assist to lift legs onto surface and to elevate trunk with rise from surface  Transfers Overall transfer level: Needs assistance   Transfers: Sit to/from Stand Sit to Stand: Min guard         General transfer comment: cues for hand placement, sequence, use of momentum from bed, recliner and BSC  Ambulation/Gait Ambulation/Gait assistance: Min guard Ambulation Distance (Feet): 200 Feet Assistive device: Rolling walker (2 wheeled) Gait Pattern/deviations: Step-through pattern;Decreased stride length;Trunk flexed   Gait velocity interpretation: Below normal speed for age/gender General Gait Details: cues for posture and position in RW with 2 standing rest breaks  Stairs            Wheelchair Mobility    Modified Rankin (Stroke Patients Only)       Balance Overall balance assessment: Needs assistance    Sitting balance-Leahy Scale: Good       Standing balance-Leahy Scale: Fair                               Pertinent Vitals/Pain Pain Assessment: No/denies pain    Home Living Family/patient expects to be discharged to:: Private residence Living Arrangements: Alone Available Help at Discharge: Family;Friend(s);Neighbor;Available PRN/intermittently Type of Home: House Home Access: Stairs to enter Entrance Stairs-Rails: None Entrance Stairs-Number of Steps: 3 Home Layout: One level Home Equipment: None      Prior Function Level of Independence: Independent               Hand Dominance   Dominant Hand: Right    Extremity/Trunk Assessment   Upper Extremity Assessment Upper Extremity Assessment: Overall WFL for tasks assessed    Lower Extremity Assessment Lower Extremity Assessment: Generalized weakness    Cervical / Trunk Assessment Cervical / Trunk Assessment: Kyphotic  Communication   Communication: No difficulties  Cognition Arousal/Alertness: Awake/alert Behavior During Therapy: WFL for tasks assessed/performed Overall Cognitive Status: Within Functional Limits for tasks assessed                      General Comments      Exercises     Assessment/Plan    PT Assessment Patient needs continued PT services  PT Problem List Decreased mobility;Decreased strength;Decreased activity tolerance;Decreased knowledge of use of DME;Decreased knowledge of precautions          PT Treatment Interventions Gait training;Therapeutic exercise;Patient/family education;Therapeutic  activities;DME instruction;Stair training;Functional mobility training    PT Goals (Current goals can be found in the Care Plan section)  Acute Rehab PT Goals Patient Stated Goal: return home PT Goal Formulation: With patient Time For Goal Achievement: 11/28/16 Potential to Achieve Goals: Good    Frequency Min 3X/week   Barriers to discharge Decreased caregiver  support      Co-evaluation               End of Session Equipment Utilized During Treatment: Gait belt Activity Tolerance: Patient tolerated treatment well Patient left: in chair;with call bell/phone within reach Nurse Communication: Mobility status;Precautions         Time: [id number] PT Time Calculation (min) (ACUTE ONLY): 27 min   Charges:   PT Evaluation $PT Eval Moderate Complexity: 1 Procedure PT Treatments $Therapeutic Activity: 8-22 mins   PT G Codes:        Rita Prom B Laquandra Carrillo 11-26-2016, 9:37 AM Elwyn Reach, [place]

## 2016-11-14 NOTE — Care Management Important Message (Signed)
Important Message  Patient Details  Name: Edward [name] MRN: [id number] Date of Birth: [birth year]   Medicare Important Message Given:  Yes    [name] 11/14/2016, 4:11 PM

## 2016-11-14 NOTE — Discharge Instructions (Signed)
We ask the SNF to please do the following: 1. Please obtain vital signs at least one time daily 2.Please weigh the patient daily. If he or she continues to gain weight or develops lower extremity edema, contact the office at [phone]. 3. Ambulate patient at least three times daily and please use sternal precautions.  Coronary Artery Bypass Grafting, Care After Refer to this sheet in the next few weeks. These instructions provide you with information on caring for yourself after your procedure. Your health care provider may also give you more specific instructions. Your treatment has been planned according to current medical practices, but problems sometimes occur. Call your health care provider if you have any problems or questions after your procedure. WHAT TO EXPECT AFTER THE PROCEDURE Recovery from surgery will be different for everyone. Some people feel well after 3 or 4 weeks, while for others it takes longer. After your procedure, it is typical to have the following:  Nausea and a lack of appetite.   Constipation.  Weakness and fatigue.   Depression or irritability.   Pain or discomfort at your incision site. HOME CARE INSTRUCTIONS  Take medicines only as directed by your health care provider. Do not stop taking medicines or start any new medicines without first checking with your health care provider.  Take your pulse as directed by your health care provider.  Perform deep breathing as directed by your health care provider. If you were given a device called an incentive spirometer, use it to practice deep breathing several times a day. Support your chest with a pillow or your arms when you take deep breaths or cough.  Keep incision areas clean, dry, and protected. Remove or change any bandages (dressings) only as directed by your health care provider. You may have skin adhesive strips over the incision areas. Do not take the strips off. They will fall off on their  own.  Check incision areas daily for any swelling, redness, or drainage.  If incisions were made in your legs, do the following:  Avoid crossing your legs.   Avoid sitting for long periods of time. Change positions every 30 minutes.   Elevate your legs when you are sitting.  Wear compression stockings as directed by your health care provider. These stockings help keep blood clots from forming in your legs.  Take showers once your health care provider approves. Until then, only take sponge baths. Pat incisions dry. Do not rub incisions with a washcloth or towel. Do not take baths, swim, or use a hot tub until your health care provider approves.  Eat foods that are high in fiber, such as raw fruits and vegetables, whole grains, beans, and nuts. Meats should be lean cut. Avoid canned, processed, and fried foods.  Drink enough fluid to keep your urine clear or pale yellow.  Weigh yourself every day. This helps identify if you are retaining fluid that may make your heart and lungs work harder.  Rest and limit activity as directed by your health care provider. You may be instructed to:  Stop any activity at once if you have chest pain, shortness of breath, irregular heartbeats, or dizziness. Get help right away if you have any of these symptoms.  Move around frequently for short periods or take short walks as directed by your health care provider. Increase your activities gradually. You may need physical therapy or cardiac rehabilitation to help strengthen your muscles and build your endurance.  Avoid lifting, pushing, or pulling anything heavier  than 10 lb (4.5 kg) for at least 6 weeks after surgery.  Do not drive until your health care provider approves.  Ask your health care provider when you may return to work.  Ask your health care provider when you may resume sexual activity.  Keep all follow-up visits as directed by your health care provider. This is important. SEEK MEDICAL  CARE IF:  You have swelling, redness, increasing pain, or drainage at the site of an incision.  You have a fever.  You have swelling in your ankles or legs.  You have pain in your legs.   You gain 2 or more pounds (0.9 kg) a day.  You are nauseous or vomit.  You have diarrhea. SEEK IMMEDIATE MEDICAL CARE IF:  You have chest pain that goes to your jaw or arms.  You have shortness of breath.   You have a fast or irregular heartbeat.   You notice a "clicking" in your breastbone (sternum) when you move.   You have numbness or weakness in your arms or legs.  You feel dizzy or light-headed.  MAKE SURE YOU:  Understand these instructions.  Will watch your condition.  Will get help right away if you are not doing well or get worse. This information is not intended to replace advice given to you by your health care provider. Make sure you discuss any questions you have with your health care provider. Document Released: 05/06/2005 Document Revised: 11/07/2014 Document Reviewed: 03/26/2013 Elsevier Interactive Patient Education  2017 [employer].   Endoscopic Saphenous Vein Harvesting, Care After Introduction Refer to this sheet in the next few weeks. These instructions provide you with information about caring for yourself after your procedure. Your health care provider may also give you more specific instructions. Your treatment has been planned according to current medical practices, but problems sometimes occur. Call your health care provider if you have any problems or questions after your procedure. What can I expect after the procedure? After the procedure, it is common to have:  Pain.  Bruising.  Swelling.  Numbness. Follow these instructions at home: Medicine  Take over-the-counter and prescription medicines only as told by your health care provider.  Do not drive or operate heavy machinery while taking prescription pain medicine. Incision  care  Follow instructions from your health care provider about how to take care of the cut made during surgery (incision). Make sure you:  Wash your hands with soap and water before you change your bandage (dressing). If soap and water are not available, use hand sanitizer.  Change your dressing as told by your health care provider.  Leave stitches (sutures), skin glue, or adhesive strips in place. These skin closures may need to be in place for 2 weeks or longer. If adhesive strip edges start to loosen and curl up, you may trim the loose edges. Do not remove adhesive strips completely unless your health care provider tells you to do that.  Check your incision area every day for signs of infection. Check for:  More redness, swelling, or pain.  More fluid or blood.  Warmth.  Pus or a bad smell. General instructions  Raise (elevate) your legs above the level of your heart while you are sitting or lying down.  Do any exercises your health care providers have given you. These may include deep breathing, coughing, and walking exercises.  Do not shower, take baths, swim, or use a hot tub unless told by your health care provider.  Wear your  elastic stocking if told by your health care provider.  Keep all follow-up visits as told by your health care provider. This is important. Contact a health care provider if:  Medicine does not help your pain.  Your pain gets worse.  You have new leg bruises or your leg bruises get bigger.  You have a fever.  Your leg feels numb.  You have more redness, swelling, or pain around your incision.  You have more fluid or blood coming from your incision.  Your incision feels warm to the touch.  You have pus or a bad smell coming from your incision. Get help right away if:  Your pain is severe.  You develop pain, tenderness, warmth, redness, or swelling in any part of your leg.  You have chest pain.  You have trouble breathing. This  information is not intended to replace advice given to you by your health care provider. Make sure you discuss any questions you have with your health care provider. Document Released: 06/29/2011 Document Revised: 03/24/2016 Document Reviewed: 08/31/2015  2017 Elsevier

## 2016-11-14 NOTE — Progress Notes (Signed)
CSW met with patient to discuss SNF placement. CSW gave patient a list of facility that have made an offer on patients behalf. Patient stated he would like a private bed at [facility]. CSW contacted [facility] to see if private bed could be offered to patient. [facility] Admission Coordinator stated they would have to check on bed availabilities  tomorrow 01/16 before making an offer. CSW asked patient if he had a second choice or facility. Patient stated he has heard good things about [facility] and would like them to be the second option.   CSW received phone call from [facility] admissions coordinator stated that patients family reach out to facility and would like to go their after d/c. Coordinator stated that they area willing to make an offer for patient and they are able to take him on 01/16. CSW remains available for support and discharge needs.  Rhea Pink, MSW,  [place]

## 2016-11-15 DIAGNOSIS — I25700 Atherosclerosis of coronary artery bypass graft(s), unspecified, with unstable angina pectoris: Secondary | ICD-10-CM | POA: Diagnosis not present

## 2016-11-15 DIAGNOSIS — I2510 Atherosclerotic heart disease of native coronary artery without angina pectoris: Secondary | ICD-10-CM | POA: Diagnosis not present

## 2016-11-15 DIAGNOSIS — E0810 Diabetes mellitus due to underlying condition with ketoacidosis without coma: Secondary | ICD-10-CM | POA: Diagnosis not present

## 2016-11-15 DIAGNOSIS — I469 Cardiac arrest, cause unspecified: Secondary | ICD-10-CM | POA: Diagnosis not present

## 2016-11-15 DIAGNOSIS — R079 Chest pain, unspecified: Secondary | ICD-10-CM | POA: Diagnosis not present

## 2016-11-15 DIAGNOSIS — M6281 Muscle weakness (generalized): Secondary | ICD-10-CM | POA: Diagnosis not present

## 2016-11-15 DIAGNOSIS — E119 Type 2 diabetes mellitus without complications: Secondary | ICD-10-CM | POA: Diagnosis not present

## 2016-11-15 DIAGNOSIS — I5032 Chronic diastolic (congestive) heart failure: Secondary | ICD-10-CM | POA: Diagnosis not present

## 2016-11-15 DIAGNOSIS — E1159 Type 2 diabetes mellitus with other circulatory complications: Secondary | ICD-10-CM | POA: Diagnosis not present

## 2016-11-15 DIAGNOSIS — R062 Wheezing: Secondary | ICD-10-CM | POA: Diagnosis not present

## 2016-11-15 DIAGNOSIS — R42 Dizziness and giddiness: Secondary | ICD-10-CM | POA: Diagnosis not present

## 2016-11-15 DIAGNOSIS — N183 Chronic kidney disease, stage 3 (moderate): Secondary | ICD-10-CM | POA: Diagnosis not present

## 2016-11-15 DIAGNOSIS — R41841 Cognitive communication deficit: Secondary | ICD-10-CM | POA: Diagnosis not present

## 2016-11-15 DIAGNOSIS — I119 Hypertensive heart disease without heart failure: Secondary | ICD-10-CM | POA: Diagnosis not present

## 2016-11-15 DIAGNOSIS — R55 Syncope and collapse: Secondary | ICD-10-CM | POA: Diagnosis not present

## 2016-11-15 DIAGNOSIS — E876 Hypokalemia: Secondary | ICD-10-CM | POA: Diagnosis not present

## 2016-11-15 DIAGNOSIS — R531 Weakness: Secondary | ICD-10-CM | POA: Diagnosis not present

## 2016-11-15 DIAGNOSIS — R198 Other specified symptoms and signs involving the digestive system and abdomen: Secondary | ICD-10-CM | POA: Diagnosis not present

## 2016-11-15 DIAGNOSIS — Z951 Presence of aortocoronary bypass graft: Secondary | ICD-10-CM | POA: Diagnosis not present

## 2016-11-15 DIAGNOSIS — I200 Unstable angina: Secondary | ICD-10-CM | POA: Diagnosis not present

## 2016-11-15 DIAGNOSIS — Z794 Long term (current) use of insulin: Secondary | ICD-10-CM | POA: Diagnosis not present

## 2016-11-15 LAB — GLUCOSE, CAPILLARY
GLUCOSE-CAPILLARY: 246 mg/dL — AB (ref 65–99)
Glucose-Capillary: 94 mg/dL (ref 65–99)
Glucose-Capillary: 85 mg/dL (ref 65–99)

## 2016-11-15 NOTE — Care Management Note (Signed)
Case Management Note Marvetta Gibbons RN, BSN Unit 2W-Case Manager [phone]  Patient Details  Name: Edward [name] MRN: [id number] Date of Birth: Jan 25, 1941  Subjective/Objective:   Pt admitted with CAD s/p cath - tx from [facility] on 11/03/16                Action/Plan: PTA pt lived at home - plan for CABG next week on 1/10 after plavix washout- CM to continue to follow for post op needs. Pt has family but unsure of available support for discharge.   Expected Discharge Date:  11/15/16               Expected Discharge Plan:  Skilled Nursing Facility  In-House Referral:  Clinical Social Work  Discharge planning Services  CM Consult  Post Acute Care Choice:    Choice offered to:     DME Arranged:    DME Agency:     HH Arranged:    [place] Agency:     Status of Service:  Completed, signed off  If discussed at [employer] of Stay Meetings, dates discussed:  1/15  Discharge Disposition: skilled facility  Additional Comments:  11/15/16- 1000- Lavren Lewan RN, CM- pt s/p CABG on 1/10- CSW following for STSNF placement- pt stable for d/c today- plan for [facility] following for SNF needs.   Dawayne Patricia, RN 11/15/2016, 9:58 AM

## 2016-11-15 NOTE — Progress Notes (Signed)
Clinical Social Worker facilitated patient discharge including contacting patient family and facility to confirm patient discharge plans.  Clinical information faxed to facility and family agreeable with plan.  CSW arranged ambulance transport via [facility] to [facility] .  RN Curt Bears to call [phone] for report prior to discharge.  Clinical Social Worker will sign off for now as social work intervention is no longer needed. Please consult [place] again if new need arises.  Rhea Pink, MSW, [place]

## 2016-11-15 NOTE — Progress Notes (Signed)
Patient awaken and he call and felt like his sugar was dropping accu ck done 94. Skin warm and dry. Patient requesting coke and graham crackers. Patient stated he felt a lot better.

## 2016-11-15 NOTE — Progress Notes (Signed)
Report given to  [facility], Discharge planner at [facility] facility.   Questions answered . Discontinued Telemetry and CCMD  Notified.   IV  NSL  Discontinued without problems.  Patient alert & oriented also aware of [facility] transportation to the nursing facility.   Mervyn Skeeters, RN

## 2016-11-15 NOTE — Progress Notes (Addendum)
      [address]       [place] [address]             [phone]      6 Days Post-Op Procedure(s) (LRB): CORONARY ARTERY BYPASS GRAFTING (CABG) x 2 (N/A) TRANSESOPHAGEAL ECHOCARDIOGRAM (TEE) (N/A) ENDOVEIN HARVEST OF GREATER SAPHENOUS VEIN (Left)   Subjective:  No new complaints.  Feeling good.  He does state social work needs to communicate better.  Objective: Vital signs in last 24 hours: Temp:  [97.4 F (36.3 C)-98.4 F (36.9 C)] 98.4 F (36.9 C) (01/16 0324) Pulse Rate:  [70-81] 70 (01/16 0324) Cardiac Rhythm: Heart block (01/15 1900) Resp:  [18] 18 (01/16 0324) BP: (134-141)/(54-60) 136/54 (01/16 0324) SpO2:  [94 %-99 %] 97 % (01/16 0324) Weight:  [267 lb 6.7 oz (121.3 kg)] 267 lb 6.7 oz (121.3 kg) (01/16 0324)  Intake/Output from previous day: 01/15 0701 - 01/16 0700 In: [id number] [P.O.:840; I.V.:3] Out: 1801 [Urine:1800; Stool:1] Intake/Output this shift: Total I/O In: 240 [P.O.:240] Out: -   General appearance: alert, cooperative and no distress Heart: regular rate and rhythm Lungs: clear to auscultation bilaterally Abdomen: soft, non-tender; bowel sounds normal; no masses,  no organomegaly Extremities: edema trace Wound: clean and dry  Lab Results: No results for input(s): WBC, HGB, HCT, PLT in the last 72 hours. BMET: No results for input(s): NA, K, CL, CO2, GLUCOSE, BUN, CREATININE, CALCIUM in the last 72 hours.  PT/INR: No results for input(s): LABPROT, INR in the last 72 hours. ABG    Component Value Date/Time   PHART 7.341 (L) 11/09/2016 2003   HCO3 24.2 11/09/2016 2003   TCO2 24 11/10/2016 1612   ACIDBASEDEF 2.0 11/09/2016 2003   O2SAT 97.0 11/09/2016 2003   CBG (last 3)   Recent Labs  11/14/16 2134 11/15/16 0318 11/15/16 0618  GLUCAP 196* 94 85    Assessment/Plan: S/P Procedure(s) (LRB): CORONARY ARTERY BYPASS GRAFTING (CABG) x 2 (N/A) TRANSESOPHAGEAL ECHOCARDIOGRAM (TEE) (N/A) ENDOVEIN HARVEST OF GREATER SAPHENOUS  VEIN (Left)  1. CV- NSR, BP okay-Lopressor, Lisinopril, Norvasc 2. Pulm- no acute issues, continue IS 3. Renal- creatinine was back to baseline, remains edematous continue Lasix 4. DM- sugars controlled 5. Dispo- patient stable, bed available today at SNF, will d/c today   LOS: 16 days    BARRETT, ERIN 11/15/2016 Patient seen and examined, agree with above To SNF today  Remo Lipps C. Roxan Hockey, MD [facility] [phone]

## 2016-11-16 DIAGNOSIS — Z794 Long term (current) use of insulin: Secondary | ICD-10-CM | POA: Diagnosis not present

## 2016-11-16 DIAGNOSIS — I2510 Atherosclerotic heart disease of native coronary artery without angina pectoris: Secondary | ICD-10-CM | POA: Diagnosis not present

## 2016-11-16 DIAGNOSIS — E119 Type 2 diabetes mellitus without complications: Secondary | ICD-10-CM | POA: Diagnosis not present

## 2016-11-17 ENCOUNTER — Telehealth: Payer: Self-pay | Admitting: Surgical

## 2016-11-17 NOTE — Progress Notes (Deleted)
Cardiology Office Note    Date:  11/17/2016   ID:  Edward [name], DOB 17-Jan-1941, MRN [id number]  PCP:  Ann Held, DO  Cardiologist:  Dr. Tamala Julian   CC: post hospital follow up -s/p CABG   History of Present Illness:  Edward [name] is a 76 y.o. male with a history of type II DM, CKD Stage II, HTN, HLD and CAD s/p previous PCIs and CABG x2V who presents to clinic for follow up.   He has a history of complex CAD. Cath 01/30/2015 with 85% mid LAD lesion treated with DES, 30-50% distal RCA lesion, EF 55%. He had shortness of breath with Brilinta however this persisted despite switching him to Effient. Was seen by Dr. Lake Bells in 11/2015 and he felt shortness of breath was related to obesity and deconditioning. He had a stress test in 06/2015 with apical scar, no current ischemia: low risk test. He had been working with pulmonary rehabilitation and trying and increase activity and loose weight.   He was admitted to [facility] from 2/17-2/23/17 for dyspnea and chest pain worrisome for [place]. Upon presentation he had reported gaining 25 pounds over the past 6 months. it was decided to proceed with left and right heart cath: Right heart cath given his ongoing dyspnea and inability to measure pulmonary pressure by echo. He underwent L&RHC 12/21/15 which revealed 40-50% L main with cross sectional area by IVUS 5.96 mm at most severe (borderline), 85% ostial LCx (culprit lesion), widely patent mLAD stent, mod non critical disease in RCA, and acute on chronic D/CHF with markedly elevated filling pressures (LVEDP 30/PCWP 24) as well as mod pulm HTN. Plan was to consult [facility] for CABG vs stenting from the circumflex back into the left main, although this would require meticulous technique and could put the LAD at risk. [facility] saw patient and felt that CABG would probably result in pain relief but no survival benefit. Dr. Roxan Hockey discussed with Dr Tamala Julian who felt it was most appropriate to attempt to  control sx with medical therapy. If he fails optimal medical therapy then either CABG or PCI would be an acceptable option for control of angina. He was started on Imdur which was titrated up. He continued to have chest pain and was started on Ranexa 1000mg  BID. Additionally, he was started on IV Lasix due to elevated filling pressures on right heart cath and diuresed well.  He was admitted again to [facility] from 2/17-2/23/18 for [place]. He was taken to the cath lab on 11/02/2016 and was found to have severe 2 vessel CAD with LM involvement. He was referred for CABG. He was taken to the operating room on 11/09/2016.  He underwent CABG x 2 utilizing LIMA to LAD and SVG to OM.  He also underwent endoscopic harvest of his greater saphenous vein from his left thigh. His post op course was uneventful and he was discharged to SNF.  Today he presents to clinic for follow up.    Past Medical History:  Diagnosis Date  . Adenomatous colon polyp 04/2011  . CAD (coronary artery disease)    a. 01/2015 DES to LAD  b. 12/2015: [place] 85% oLCx lesion--> rx therapy.  . Chronic diastolic CHF (congestive heart failure) ([place])   . CKD (chronic kidney disease), stage II    a. probable CKD II-III with baseline CR 1.1-1.3.  . DM type 2 (diabetes mellitus, type 2), insulin dependent 01/29/2014   fasting cbg 50-120 with new regimen  .  Dyslipidemia, goal LDL below 70 01/29/2014  . Enlarged prostate   . Essential tremor    a. on proprnolol  . GERD (gastroesophageal reflux disease)   . Hypertension   . Internal hemorrhoid   . Sleep apnea    does not use cpap (06/04/2015)  . TIA (transient ischemic attack) 2002    Past Surgical History:  Procedure Laterality Date  . BACK SURGERY    . CARDIAC CATHETERIZATION  01/28/14   + CAD treat medically  . CARDIAC CATHETERIZATION N/A 12/21/2015   Procedure: Right/Left Heart Cath and Coronary Angiography;  Surgeon: Belva Crome, MD;  Location: [place] CV [facility];  Service: Cardiovascular;   Laterality: N/A;  . CARDIAC CATHETERIZATION N/A 11/02/2016   Procedure: Left Heart Cath and Coronary Angiography;  Surgeon: Nelva Bush, MD;  Location: [place] [facility];  Service: Cardiovascular;  Laterality: N/A;  . CATARACT EXTRACTION Bilateral   . CATARACT EXTRACTION, BILATERAL Bilateral   . CORONARY ANGIOPLASTY WITH STENT PLACEMENT  01/30/2015   DES Promus  [place] to LAD by Dr Tamala Julian  . CORONARY ARTERY BYPASS GRAFT N/A 11/09/2016   Procedure: CORONARY ARTERY BYPASS GRAFTING (CABG) x 2;  Surgeon: Melrose Nakayama, MD;  Location: [place];  Service: Open Heart Surgery;  Laterality: N/A;  . ENDOVEIN HARVEST OF GREATER SAPHENOUS VEIN Left 11/09/2016   Procedure: ENDOVEIN HARVEST OF GREATER SAPHENOUS VEIN;  Surgeon: Melrose Nakayama, MD;  Location: [place];  Service: Open Heart Surgery;  Laterality: Left;  . JOINT REPLACEMENT    . LEFT HEART CATHETERIZATION WITH CORONARY ANGIOGRAM N/A 01/28/2014   Procedure: LEFT HEART CATHETERIZATION WITH CORONARY ANGIOGRAM;  Surgeon: Sinclair Grooms, MD;  Location: [facility] CATH LAB;  Service: Cardiovascular;  Laterality: N/A;  . LEFT HEART CATHETERIZATION WITH CORONARY ANGIOGRAM N/A 01/30/2015   Procedure: LEFT HEART CATHETERIZATION WITH CORONARY ANGIOGRAM;  Surgeon: Belva Crome, MD;  Location: [facility] CATH LAB;  Service: Cardiovascular;  Laterality: N/A;  . LUMBAR [place]    . SHOULDER ARTHROSCOPY Right 07/30/2014   Procedure: Right Shoulder Arthroscopy, Debridement, Decompression, Manipulation Under Anesthesia;  Surgeon: Newt Minion, MD;  Location: [place];  Service: Orthopedics;  Laterality: Right;  . TEE WITHOUT CARDIOVERSION N/A 11/09/2016   Procedure: TRANSESOPHAGEAL ECHOCARDIOGRAM (TEE);  Surgeon: Melrose Nakayama, MD;  Location: [place];  Service: Open Heart Surgery;  Laterality: N/A;  . TOTAL KNEE ARTHROPLASTY Bilateral     Current Medications: Outpatient Medications Prior to Visit  Medication Sig Dispense Refill  . amLODipine (NORVASC) 10 MG  tablet Take 1 tablet (10 mg total) by mouth daily. 30 tablet 11  . aspirin EC 81 MG tablet Take 81 mg by mouth daily.    Marland Kitchen atorvastatin (LIPITOR) 80 MG tablet Take 1 tablet (80 mg total) by mouth daily.    . clonazePAM (KLONOPIN) 0.5 MG tablet Take 1 tablet (0.5 mg total) by mouth 2 (two) times daily as needed (tremors). 45 tablet 3  . diclofenac sodium (VOLTAREN) 1 % GEL Apply 2 g topically 4 (four) times daily as needed (for pain).     Marland Kitchen dicyclomine (BENTYL) 10 MG capsule Take 1 capsule (10 mg total) by mouth 3 (three) times daily before meals. 90 capsule 5  . EFFIENT 10 MG TABS tablet take 1 tablet by mouth once daily 30 tablet 2  . fenofibrate 160 MG tablet Take 1 tablet (160 mg total) by mouth daily.  0  . fluticasone furoate-vilanterol (BREO ELLIPTA) 100-25 MCG/INH AEPB Inhale 1 puff into  the lungs daily. 60 each 5  . furosemide (LASIX) 40 MG tablet Take 1 tablet (40 mg total) by mouth daily as needed for fluid.    Marland Kitchen gabapentin (NEURONTIN) 300 MG capsule Take 1 tablet at bedtime for one week then increase to 1 tablet twice daily (Patient taking differently: Take 300 mg by mouth 2 (two) times daily. ) 60 capsule 11  . insulin detemir (LEVEMIR) 100 UNIT/ML injection Inject 0.7 mLs (70 Units total) into the skin 2 (two) times daily. 10 mL 11  . lisinopril (PRINIVIL,ZESTRIL) 10 MG tablet Take 10 mg by mouth daily.  0  . meclizine (ANTIVERT) 25 MG tablet Take 1 tablet (25 mg total) by mouth 3 (three) times daily as needed for dizziness (vertigo). 30 tablet 0  . metoprolol (LOPRESSOR) 25 MG tablet Take 1 tablet (25 mg total) by mouth 2 (two) times daily.    Marland Kitchen NOVOLIN R 100 UNIT/ML injection inject 20-36 units subcutaneously three times a day before meals per sliding scale  0  . oxyCODONE (OXY IR/ROXICODONE) 5 MG immediate release tablet Take 5 mg by mouth every 4-6 hours PRN severe pain. 30 tablet 0  . pantoprazole (PROTONIX) 40 MG tablet Take 40 mg by mouth daily.  1  . Polyvinyl Alcohol-Povidone  (REFRESH OP) Place 1 drop into both eyes daily as needed (for dry eyes). Reported on 12/25/2015    . potassium chloride SA (K-DUR,KLOR-CON) 20 MEQ tablet Take 1 tablet (20 mEq total) by mouth daily as needed. Only take if takes Lasix 40 mg daily. 30 tablet 11  . VENTOLIN HFA 108 (90 BASE) MCG/ACT inhaler Inhale 2 puffs into the lungs every 6 (six) hours as needed for wheezing or shortness of breath.   0   No facility-administered medications prior to visit.      Allergies:   No known allergies   Social History   Social History  . Marital status: Married    Spouse name: N/A  . Number of children: 0  . Years of education: N/A   Occupational History  . retired    Social History Main Topics  . Smoking status: Never Smoker  . Smokeless tobacco: Never Used  . Alcohol use No  . Drug use: No  . Sexual activity: No   Other Topics Concern  . 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   He lives alone.  His wife is living in [place] at this time.  They do not have children.   Highest level of education:  College graduate     Family History:  The patient's family history includes Alzheimer's disease in his sister; Asthma in his brother; CAD in his brother and sister; Diabetes in his other; Prostate cancer in his brother.      *** ROS/PE    Wt Readings from Last 3 Encounters:  11/15/16 267 lb 6.7 oz (121.3 kg)  09/28/16 277 lb 7 oz (125.8 kg)  09/13/16 274 lb (124.3 kg)      Studies/Labs Reviewed:   EKG:  EKG is*** ordered today.  The ekg ordered today demonstrates ***  Recent Labs: 10/29/2016: ALT 16; B Natriuretic Peptide 21.4; TSH 2.344 11/10/2016: Magnesium 1.9 11/12/2016: BUN 13; Creatinine, Ser 1.40; Hemoglobin 9.9; Platelets 216; Potassium 4.0; Sodium 138   Lipid Panel    Component Value Date/Time   CHOL 130 10/30/2016 0701   TRIG  93 10/30/2016 0701   HDL 33 (L)  10/30/2016 0701   CHOLHDL 3.9 10/30/2016 0701   VLDL 19 10/30/2016 0701   LDLCALC 78 10/30/2016 0701   LDLDIRECT 144.0 07/08/2015 1344    Additional studies/ records that were reviewed today include:  Cath 11/02/16 Conclusions: 1. Significant LMCA (60%) and ostial LCx (85%) stenoses.  Eccentric mid LMCA stenosis appears somewhat worse than prior angiogram in 12/2015.  Lesion was borderline significant at that time by IVUS. 2. Patent mid LAD stent with mild to moderate proximal LAD, LCx/OM, and RCA disease. 3. Mildly elevated left ventricular filling pressure.   Recommendations: 1. Cardiac surgery consultation for CABG, given progression of LMCA disease. 2. Discontinue clopidogrel in anticipation of possible CABG. 3. Restart heparin infusion 4 hours after TR band removal. 4. Continue aggressive secondary prevention.  Findings and recommendations were discussed with Dr. Tamala Julian.    2D ECHO: 11/02/2016 LV EF: 60% -   65% Study Conclusions - Left ventricle: The cavity size was normal. Wall thickness was   increased in a pattern of moderate LVH. Systolic function was   normal. The estimated ejection fraction was in the range of 60%   to 65%. Wall motion was normal; there were no regional wall   motion abnormalities. Doppler parameters are consistent with   abnormal left ventricular relaxation (grade 1 diastolic   dysfunction). - Aortic valve: There was no stenosis. - Mitral valve: Mildly calcified annulus. There was trivial   regurgitation. - Left atrium: The atrium was mildly dilated. - Right ventricle: The cavity size was mildly dilated. Systolic   function was normal. - Right atrium: The atrium was mildly dilated. - Tricuspid valve: Peak RV-RA gradient (S): 34 mm Hg. - Pulmonary arteries: PA peak pressure: 37 mm Hg (S). - Inferior vena cava: The vessel was normal in size. The   respirophasic diameter changes were in the normal range (>= 50%),   consistent with normal central  venous pressure. Impressions: - Normal LV size with moderate LV hypertrophy. EF 60-65%. Mildly   dilated RV with normal systolic function. No significant valvular   abnormalities. Mild pulmonary hypertension.     ASSESSMENT & PLAN:   CAD s/p CABG x2V:  HTN:  HLD:  Chronic diastolic CHF:  DMT2:  Essential tremor:   Medication Adjustments/Labs and Tests Ordered: Current medicines are reviewed at length with the patient today.  Concerns regarding medicines are outlined above.  Medication changes, Labs and Tests ordered today are listed in the Patient Instructions below. There are no Patient Instructions on file for this visit.   Signed, Angelena Form, PA-C  11/17/2016 2:57 PM    [facility] [address], [place], [place]  [address] Phone: [phone]; Fax: [phone]

## 2016-11-17 NOTE — Telephone Encounter (Signed)
      [address]       [place] [address]             [place] [id number]  Phone call placed to patient . Did not leave message on answering machine at this time   Yaquelin Langelier E, PA-C

## 2016-11-21 ENCOUNTER — Telehealth: Payer: Self-pay | Admitting: Surgical

## 2016-11-21 NOTE — Telephone Encounter (Signed)
      [address]       [place] [address]             [phone]     Phone call attempted. Answering machine only. No message left at this time    GOLD,WAYNE E

## 2016-11-23 DIAGNOSIS — I2510 Atherosclerotic heart disease of native coronary artery without angina pectoris: Secondary | ICD-10-CM | POA: Diagnosis not present

## 2016-11-23 DIAGNOSIS — E119 Type 2 diabetes mellitus without complications: Secondary | ICD-10-CM | POA: Diagnosis not present

## 2016-11-23 DIAGNOSIS — R198 Other specified symptoms and signs involving the digestive system and abdomen: Secondary | ICD-10-CM | POA: Diagnosis not present

## 2016-11-24 ENCOUNTER — Encounter: Payer: Medicare Other | Admitting: Physician Assistant

## 2016-11-25 DIAGNOSIS — E119 Type 2 diabetes mellitus without complications: Secondary | ICD-10-CM | POA: Diagnosis not present

## 2016-11-25 DIAGNOSIS — I2510 Atherosclerotic heart disease of native coronary artery without angina pectoris: Secondary | ICD-10-CM | POA: Diagnosis not present

## 2016-11-30 DIAGNOSIS — I2510 Atherosclerotic heart disease of native coronary artery without angina pectoris: Secondary | ICD-10-CM | POA: Diagnosis not present

## 2016-11-30 DIAGNOSIS — E119 Type 2 diabetes mellitus without complications: Secondary | ICD-10-CM | POA: Diagnosis not present

## 2016-12-02 ENCOUNTER — Telehealth: Payer: Self-pay | Admitting: Internal Medicine

## 2016-12-02 NOTE — Telephone Encounter (Signed)
Sorry to hear that! I am glad he is ok.

## 2016-12-02 NOTE — Telephone Encounter (Signed)
FYI-pt is now out of the hospital from his heart bypass

## 2016-12-03 DIAGNOSIS — I130 Hypertensive heart and chronic kidney disease with heart failure and stage 1 through stage 4 chronic kidney disease, or unspecified chronic kidney disease: Secondary | ICD-10-CM | POA: Diagnosis not present

## 2016-12-03 DIAGNOSIS — I219 Acute myocardial infarction, unspecified: Secondary | ICD-10-CM | POA: Diagnosis not present

## 2016-12-03 DIAGNOSIS — E1122 Type 2 diabetes mellitus with diabetic chronic kidney disease: Secondary | ICD-10-CM | POA: Diagnosis not present

## 2016-12-03 DIAGNOSIS — I2510 Atherosclerotic heart disease of native coronary artery without angina pectoris: Secondary | ICD-10-CM | POA: Diagnosis not present

## 2016-12-03 DIAGNOSIS — I5032 Chronic diastolic (congestive) heart failure: Secondary | ICD-10-CM | POA: Diagnosis not present

## 2016-12-03 DIAGNOSIS — Z48812 Encounter for surgical aftercare following surgery on the circulatory system: Secondary | ICD-10-CM | POA: Diagnosis not present

## 2016-12-05 ENCOUNTER — Telehealth: Payer: Self-pay | Admitting: Family Medicine

## 2016-12-05 DIAGNOSIS — I219 Acute myocardial infarction, unspecified: Secondary | ICD-10-CM | POA: Diagnosis not present

## 2016-12-05 DIAGNOSIS — Z48812 Encounter for surgical aftercare following surgery on the circulatory system: Secondary | ICD-10-CM | POA: Diagnosis not present

## 2016-12-05 DIAGNOSIS — I130 Hypertensive heart and chronic kidney disease with heart failure and stage 1 through stage 4 chronic kidney disease, or unspecified chronic kidney disease: Secondary | ICD-10-CM | POA: Diagnosis not present

## 2016-12-05 DIAGNOSIS — I5032 Chronic diastolic (congestive) heart failure: Secondary | ICD-10-CM | POA: Diagnosis not present

## 2016-12-05 DIAGNOSIS — I2510 Atherosclerotic heart disease of native coronary artery without angina pectoris: Secondary | ICD-10-CM | POA: Diagnosis not present

## 2016-12-05 DIAGNOSIS — E1122 Type 2 diabetes mellitus with diabetic chronic kidney disease: Secondary | ICD-10-CM | POA: Diagnosis not present

## 2016-12-05 NOTE — Telephone Encounter (Signed)
Caller name: Will  Relation to pt: OT from [facility]  Call back number: [phone]    Reason for call:  Verbal orders OT 1x 1, 2x 2, 1x 1

## 2016-12-05 NOTE — Telephone Encounter (Signed)
Caller name: Laurey Arrow Relationship to patient: [facility] PT Can be reached: [phone] Pharmacy:  Reason for call: Request verbal orders for PT 2 times a week for 3 weeks and then 1 time a week for 1 week to work on walking and strengthening

## 2016-12-05 NOTE — Telephone Encounter (Signed)
Ok to give verbal 

## 2016-12-05 NOTE — Telephone Encounter (Signed)
HHRN informed of verbal ok. 

## 2016-12-06 DIAGNOSIS — I2510 Atherosclerotic heart disease of native coronary artery without angina pectoris: Secondary | ICD-10-CM | POA: Diagnosis not present

## 2016-12-06 DIAGNOSIS — E1122 Type 2 diabetes mellitus with diabetic chronic kidney disease: Secondary | ICD-10-CM | POA: Diagnosis not present

## 2016-12-06 DIAGNOSIS — Z48812 Encounter for surgical aftercare following surgery on the circulatory system: Secondary | ICD-10-CM | POA: Diagnosis not present

## 2016-12-06 DIAGNOSIS — I130 Hypertensive heart and chronic kidney disease with heart failure and stage 1 through stage 4 chronic kidney disease, or unspecified chronic kidney disease: Secondary | ICD-10-CM | POA: Diagnosis not present

## 2016-12-06 DIAGNOSIS — I5032 Chronic diastolic (congestive) heart failure: Secondary | ICD-10-CM | POA: Diagnosis not present

## 2016-12-06 DIAGNOSIS — I219 Acute myocardial infarction, unspecified: Secondary | ICD-10-CM | POA: Diagnosis not present

## 2016-12-07 ENCOUNTER — Emergency Department (HOSPITAL_COMMUNITY): Payer: Medicare Other

## 2016-12-07 ENCOUNTER — Encounter (HOSPITAL_COMMUNITY): Payer: Self-pay

## 2016-12-07 ENCOUNTER — Emergency Department (HOSPITAL_COMMUNITY)
Admission: EM | Admit: 2016-12-07 | Discharge: 2016-12-07 | Disposition: A | Discharge status: Home / Self Care | Payer: Medicare Other | Attending: Emergency Medicine | Admitting: Emergency Medicine

## 2016-12-07 DIAGNOSIS — I130 Hypertensive heart and chronic kidney disease with heart failure and stage 1 through stage 4 chronic kidney disease, or unspecified chronic kidney disease: Secondary | ICD-10-CM | POA: Insufficient documentation

## 2016-12-07 DIAGNOSIS — Z8673 Personal history of transient ischemic attack (TIA), and cerebral infarction without residual deficits: Secondary | ICD-10-CM | POA: Insufficient documentation

## 2016-12-07 DIAGNOSIS — Z794 Long term (current) use of insulin: Secondary | ICD-10-CM | POA: Diagnosis not present

## 2016-12-07 DIAGNOSIS — I2510 Atherosclerotic heart disease of native coronary artery without angina pectoris: Secondary | ICD-10-CM | POA: Insufficient documentation

## 2016-12-07 DIAGNOSIS — Z96653 Presence of artificial knee joint, bilateral: Secondary | ICD-10-CM | POA: Diagnosis not present

## 2016-12-07 DIAGNOSIS — R072 Precordial pain: Secondary | ICD-10-CM | POA: Insufficient documentation

## 2016-12-07 DIAGNOSIS — I5032 Chronic diastolic (congestive) heart failure: Secondary | ICD-10-CM | POA: Diagnosis not present

## 2016-12-07 DIAGNOSIS — Z7982 Long term (current) use of aspirin: Secondary | ICD-10-CM | POA: Insufficient documentation

## 2016-12-07 DIAGNOSIS — Z951 Presence of aortocoronary bypass graft: Secondary | ICD-10-CM | POA: Diagnosis not present

## 2016-12-07 DIAGNOSIS — N183 Chronic kidney disease, stage 3 (moderate): Secondary | ICD-10-CM | POA: Diagnosis not present

## 2016-12-07 DIAGNOSIS — G8918 Other acute postprocedural pain: Secondary | ICD-10-CM | POA: Insufficient documentation

## 2016-12-07 DIAGNOSIS — R079 Chest pain, unspecified: Secondary | ICD-10-CM | POA: Diagnosis not present

## 2016-12-07 DIAGNOSIS — R0789 Other chest pain: Secondary | ICD-10-CM

## 2016-12-07 DIAGNOSIS — Z955 Presence of coronary angioplasty implant and graft: Secondary | ICD-10-CM | POA: Diagnosis not present

## 2016-12-07 DIAGNOSIS — Z79899 Other long term (current) drug therapy: Secondary | ICD-10-CM | POA: Diagnosis not present

## 2016-12-07 DIAGNOSIS — E1122 Type 2 diabetes mellitus with diabetic chronic kidney disease: Secondary | ICD-10-CM | POA: Insufficient documentation

## 2016-12-07 LAB — I-STAT TROPONIN, ED
TROPONIN I, POC: 0 ng/mL (ref 0.00–0.08)
TROPONIN I, POC: 0 ng/mL (ref 0.00–0.08)

## 2016-12-07 LAB — BASIC METABOLIC PANEL
Anion gap: 9 (ref 5–15)
BUN: 17 mg/dL (ref 6–20)
CALCIUM: 9.1 mg/dL (ref 8.9–10.3)
CO2: 27 mmol/L (ref 22–32)
CREATININE: 1.35 mg/dL — AB (ref 0.61–1.24)
Chloride: 105 mmol/L (ref 101–111)
GFR calc Af Amer: 58 mL/min — ABNORMAL LOW (ref >60)
GFR calc non Af Amer: 50 mL/min — ABNORMAL LOW (ref >60)
GLUCOSE: 111 mg/dL — AB (ref 65–99)
Potassium: 3.7 mmol/L (ref 3.5–5.1)
Sodium: 141 mmol/L (ref 135–145)

## 2016-12-07 LAB — CBC
HCT: 32.7 % — ABNORMAL LOW (ref 39.0–52.0)
HEMOGLOBIN: 10.4 g/dL — AB (ref 13.0–17.0)
MCH: 26.7 pg (ref 26.0–34.0)
MCHC: 31.8 g/dL (ref 30.0–36.0)
MCV: 83.8 fL (ref 78.0–100.0)
PLATELETS: 292 10*3/uL (ref 150–400)
RBC: 3.9 MIL/uL — ABNORMAL LOW (ref 4.22–5.81)
RDW: 14.6 % (ref 11.5–15.5)
WBC: 6.7 10*3/uL (ref 4.0–10.5)

## 2016-12-07 MED ORDER — OXYCODONE-ACETAMINOPHEN 5-325 MG PO TABS
1.0000 | ORAL_TABLET | ORAL | Status: DC | PRN
  Administered 2016-12-07: 1 via ORAL

## 2016-12-07 MED ORDER — OXYCODONE-ACETAMINOPHEN 5-325 MG PO TABS
ORAL_TABLET | ORAL | Status: AC
  Filled 2016-12-07: qty 1

## 2016-12-07 MED ORDER — DM-GUAIFENESIN ER 30-600 MG PO TB12: 1.0000 | ORAL_TABLET | Freq: Two times a day (BID) | ORAL | 0 refills | Status: DC

## 2016-12-07 MED ORDER — ACETAMINOPHEN 325 MG PO TABS
650.0000 mg | ORAL_TABLET | Freq: Once | ORAL | Status: AC
  Administered 2016-12-07: 650 mg via ORAL
  Filled 2016-12-07: qty 2

## 2016-12-07 MED ORDER — ACETAMINOPHEN 500 MG PO TABS: 1000.0000 mg | ORAL_TABLET | Freq: Four times a day (QID) | ORAL | 0 refills | Status: DC | PRN

## 2016-12-07 NOTE — ED Triage Notes (Signed)
Pt comes via [facility] c/o CP, left sided non radiating, denies n/v/sob. Started around 43 tonight but has been on and off for the past few days. Pt also has productive cough. Had triple bypass in January, PTA received 325 asa and one nitro

## 2016-12-07 NOTE — ED Provider Notes (Signed)
[facility] DEPT Provider Note   CSN: [id number] Arrival date & time: 12/07/16  [id number]     History   Chief Complaint Chief Complaint  Patient presents with  . Chest Pain    HPI Edward [name] is a 76 y.o. male.  The history is provided by the patient.  Chest Pain   This is a new problem. The current episode started 2 days ago. The problem occurs daily. The problem has been gradually worsening. The pain is associated with coughing. The pain is present in the substernal region (left of sternum). The quality of the pain is described as sharp. The pain does not radiate. Duration of episode(s) is 2 days. Associated symptoms include shortness of breath (since leaving rehab 5 days ago). Pertinent negatives include no abdominal pain, no back pain, no fever and no lower extremity edema. He has tried nothing for the symptoms. The treatment provided no relief. Risk factors include obesity (CABG 1 month ago).  His past medical history is significant for CAD, diabetes, hyperlipidemia and hypertension.    Past Medical History:  Diagnosis Date  . Adenomatous colon polyp 04/2011  . CAD (coronary artery disease)    a. 01/2015 DES to LAD  b. 12/2015: [place] 85% oLCx lesion--> rx therapy.  . Chronic diastolic CHF (congestive heart failure) ([place])   . CKD (chronic kidney disease), stage II    a. probable CKD II-III with baseline CR 1.1-1.3.  . DM type 2 (diabetes mellitus, type 2), insulin dependent 01/29/2014   fasting cbg 50-120 with new regimen  . Dyslipidemia, goal LDL below 70 01/29/2014  . Enlarged prostate   . Essential tremor    a. on proprnolol  . GERD (gastroesophageal reflux disease)   . Hypertension   . Internal hemorrhoid   . Sleep apnea    does not use cpap (06/04/2015)  . TIA (transient ischemic attack) 2002    Patient Active Problem List   Diagnosis Date Noted  . 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 Hypertensive heart disease without heart failure   . Chronic kidney disease, stage III  (moderate) 11/03/2016  . Chronic diastolic HF (heart failure) ([place])   . Flank pain, acute 07/06/2016  . Dizziness   . Hypokalemia 04/19/2016  . Chest pain 04/19/2016  . Near syncope 04/19/2016  . Lightheaded 04/19/2016  . Type 2 diabetes mellitus with circulatory disorder, with long-term current use of insulin ([place]) 03/11/2016  . Atherosclerosis of native coronary artery of native heart with unstable angina pectoris ([place])   . Wheezing 11/30/2015  . GERD (gastroesophageal reflux disease) 08/27/2015  . Acute bronchitis 08/27/2015  . Dyspnea 07/08/2015  . OSA (obstructive sleep apnea) 06/22/2015  . Essential tremor 04/23/2015  . Rotator cuff arthropathy 07/30/2014  . CAP (community acquired pneumonia) 04/16/2014  . Severe obesity (BMI >= 40) ([place]) 02/11/2014  . Dyslipidemia, goal LDL below 70 01/29/2014  . HTN (hypertension) 01/29/2014    Past Surgical History:  Procedure Laterality Date  . BACK SURGERY    . CARDIAC CATHETERIZATION  01/28/14   + CAD treat medically  . CARDIAC CATHETERIZATION N/A 12/21/2015   Procedure: Right/Left Heart Cath and Coronary Angiography;  Surgeon: Belva Crome, MD;  Location: [facility];  Service: Cardiovascular;  Laterality: N/A;  . CARDIAC CATHETERIZATION N/A 11/02/2016   Procedure: Left Heart Cath and Coronary Angiography;  Surgeon: Nelva Bush, MD;  Location: [facility];  Service: Cardiovascular;  Laterality: N/A;  . CATARACT EXTRACTION Bilateral   .  CATARACT EXTRACTION, BILATERAL Bilateral   . CORONARY ANGIOPLASTY WITH STENT PLACEMENT  01/30/2015   DES [place]  [place] to LAD by Dr Tamala Julian  . CORONARY ARTERY BYPASS GRAFT N/A 11/09/2016   Procedure: CORONARY ARTERY BYPASS GRAFTING (CABG) x 2;  Surgeon: Melrose Nakayama, MD;  Location: [facility];  Service: Open Heart Surgery;  Laterality: N/A;  . ENDOVEIN HARVEST OF GREATER SAPHENOUS VEIN Left 11/09/2016   Procedure: ENDOVEIN HARVEST OF GREATER SAPHENOUS VEIN;  Surgeon: Melrose Nakayama, MD;  Location: [facility];  Service: Open Heart Surgery;  Laterality: Left;  . JOINT REPLACEMENT    . LEFT HEART CATHETERIZATION WITH CORONARY ANGIOGRAM N/A 01/28/2014   Procedure: LEFT HEART CATHETERIZATION WITH CORONARY ANGIOGRAM;  Surgeon: Sinclair Grooms, MD;  Location: [facility] CATH LAB;  Service: Cardiovascular;  Laterality: N/A;  . LEFT HEART CATHETERIZATION WITH CORONARY ANGIOGRAM N/A 01/30/2015   Procedure: LEFT HEART CATHETERIZATION WITH CORONARY ANGIOGRAM;  Surgeon: Belva Crome, MD;  Location: [facility] C[facility] LAB;  Service: Cardiovascular;  Laterality: N/A;  . LUMBAR [place]    . SHOULDER ARTHROSCOPY Right 07/30/2014   Procedure: Right Shoulder Arthroscopy, Debridement, Decompression, Manipulation Under Anesthesia;  Surgeon: Newt Minion, MD;  Location: [facility];  Service: Orthopedics;  Laterality: Right;  . TEE WITHOUT CARDIOVERSION N/A 11/09/2016   Procedure: TRANSESOPHAGEAL ECHOCARDIOGRAM (TEE);  Surgeon: Melrose Nakayama, MD;  Location: [facility];  Service: Open Heart Surgery;  Laterality: N/A;  . TOTAL KNEE ARTHROPLASTY Bilateral        Home Medications    Prior to Admission medications   Medication Sig Start Date End Date Taking? Authorizing Provider  amLODipine (NORVASC) 10 MG tablet Take 1 tablet (10 mg total) by mouth daily. 07/06/16  Yes Yvonne R Lowne Chase, DO  aspirin EC 81 MG tablet Take 81 mg by mouth daily.   Yes Historical Provider, MD  atorvastatin (LIPITOR) 80 MG tablet Take 1 tablet (80 mg total) by mouth daily. 11/14/16  Yes Donielle Liston Alba, PA-C  clonazePAM (KLONOPIN) 0.5 MG tablet Take 1 tablet (0.5 mg total) by mouth 2 (two) times daily as needed (tremors). 09/28/16  Yes Donika K Patel, DO  diclofenac sodium (VOLTAREN) 1 % GEL Apply 2 g topically 4 (four) times daily as needed (for pain).    Yes Historical Provider, MD  dicyclomine (BENTYL) 10 MG capsule Take 1 capsule (10 mg total) by mouth 3 (three) times daily before meals. 01/01/16  Yes Yvonne R Lowne  Chase, DO  EFFIENT 10 MG TABS tablet take 1 tablet by mouth once daily 09/20/16  Yes Belva Crome, MD  fenofibrate 160 MG tablet Take 1 tablet (160 mg total) by mouth daily. 07/06/16  Yes Yvonne R Lowne Chase, DO  fluticasone furoate-vilanterol (BREO ELLIPTA) 100-25 MCG/INH AEPB Inhale 1 puff into the lungs daily. 01/01/16  Yes Yvonne R Lowne Chase, DO  furosemide (LASIX) 40 MG tablet Take 1 tablet (40 mg total) by mouth daily as needed for fluid. Patient taking differently: Take 40 mg by mouth daily.  11/14/16  Yes Donielle Liston Alba, PA-C  gabapentin (NEURONTIN) 300 MG capsule Take 1 tablet at bedtime for one week then increase to 1 tablet twice daily Patient taking differently: Take 300 mg by mouth 2 (two) times daily.  07/28/16  Yes Donika K Patel, DO  insulin detemir (LEVEMIR) 100 UNIT/ML injection Inject 0.7 mLs (70 Units total) into the skin 2 (two) times daily. Patient taking differently: Inject 40-60 Units into the skin 3 (  three) times daily. 60 units in the morning, 40 units with lunch and 60 units in the evening 11/14/16  Yes Donielle Liston Alba, PA-C  lisinopril (PRINIVIL,ZESTRIL) 10 MG tablet Take 10 mg by mouth daily. 08/12/16  Yes Historical Provider, MD  meclizine (ANTIVERT) 25 MG tablet Take 1 tablet (25 mg total) by mouth 3 (three) times daily as needed for dizziness (vertigo). 04/22/16  Yes Theodis Blaze, MD  metoprolol (LOPRESSOR) 25 MG tablet Take 1 tablet (25 mg total) by mouth 2 (two) times daily. 11/14/16  Yes Donielle Liston Alba, PA-C  oxyCODONE (OXY IR/ROXICODONE) 5 MG immediate release tablet Take 5 mg by mouth every 4-6 hours PRN severe pain. 11/14/16  Yes Donielle Liston Alba, PA-C  pantoprazole (PROTONIX) 40 MG tablet Take 40 mg by mouth daily. 01/04/16  Yes Historical Provider, MD  Polyvinyl Alcohol-Povidone (REFRESH OP) Place 1 drop into both eyes daily as needed (for dry eyes). Reported on 12/25/2015   Yes Historical Provider, MD  potassium chloride SA (K-DUR,KLOR-CON) 20 MEQ  tablet Take 1 tablet (20 mEq total) by mouth daily as needed. Only take if takes Lasix 40 mg daily. Patient taking differently: Take 20 mEq by mouth every other day. Only take if takes Lasix 40 mg daily. 11/14/16  Yes Donielle Liston Alba, PA-C  VENTOLIN HFA 108 (90 BASE) MCG/ACT inhaler Inhale 2 puffs into the lungs every 6 (six) hours as needed for wheezing or shortness of breath.  06/22/15  Yes Historical Provider, MD    Family History Family History  Problem Relation Age of Onset  . CAD Brother     2 brothers - CABG  . Alzheimer's disease Sister   . CAD Sister   . Prostate cancer Brother   . Asthma Brother     2 brothers   . Diabetes Other     entire family    Social History Social History  Substance Use Topics  . Smoking status: Never Smoker  . Smokeless tobacco: Never Used  . Alcohol use No     Allergies   No known allergies   Review of Systems Review of Systems  Constitutional: Negative for fever.  Respiratory: Positive for shortness of breath (since leaving rehab 5 days ago).   Cardiovascular: Positive for chest pain.  Gastrointestinal: Negative for abdominal pain.  Musculoskeletal: Negative for back pain.  All other systems reviewed and are negative.    Physical Exam Updated Vital Signs BP 119/62   Pulse 64   Temp 98.2 F (36.8 C)   Resp 17   Ht 5\' 8"  (1.727 m)   Wt 264 lb (119.7 kg)   SpO2 98%   BMI 40.14 kg/m   Physical Exam  Constitutional: He is oriented to person, place, and time. He appears well-developed and well-nourished. No distress.  HENT:  Head: Normocephalic and atraumatic.  Nose: Nose normal.  Eyes: Conjunctivae are normal.  Neck: Neck supple. No tracheal deviation present.  Cardiovascular: Normal rate, regular rhythm and normal heart sounds.   Pulmonary/Chest: Effort normal and breath sounds normal. No respiratory distress. He exhibits tenderness (left sternal).  Abdominal: Soft. He exhibits no distension.  Neurological: He is  alert and oriented to person, place, and time.  Skin: Skin is warm and dry.  Psychiatric: He has a normal mood and affect.  Vitals reviewed.    ED Treatments / Results  Labs (all labs ordered are listed, but only abnormal results are displayed) Labs Reviewed  BASIC METABOLIC PANEL - Abnormal; Notable for the  following:       Result Value   Glucose, Bld 111 (*)    Creatinine, Ser 1.35 (*)    GFR calc non Af Amer 50 (*)    GFR calc Af Amer 58 (*)    All other components within normal limits  CBC - Abnormal; Notable for the following:    RBC 3.90 (*)    Hemoglobin 10.4 (*)    HCT 32.7 (*)    All other components within normal limits  I-STAT TROPOININ, ED  I-STAT TROPOININ, ED    EKG  EKG Interpretation  Date/Time:  Wednesday December 07 2016 03:14:44 EST Ventricular Rate:  71 PR Interval:  260 QRS Duration: 88 QT Interval:  390 QTC Calculation: 423 R Axis:   78 Text Interpretation:  Sinus rhythm with 1st degree A-V block with occasional Premature ventricular complexes Anteroseptal infarct , age undetermined Abnormal ECG nonspecific t wave changes in lateral leads Confirmed by Kenna Gilbert, KRISTEN [phone]) on 12/07/2016 3:56:07 AM       Radiology Dg Chest 2 View  Result Date: 12/07/2016 CLINICAL DATA:  76 y/o M; 1 day and chest pain. History of recent CABG. EXAM: CHEST  2 VIEW COMPARISON:  11/11/2016 chest radiograph FINDINGS: Borderline cardiomegaly. Status post CABG. Sternotomy wires are aligned. Linear opacities at left lung base are consistent with atelectasis or scarring. No focal consolidation. No pleural effusion. Moderate lower thoracic dextrocurvature and multilevel degenerative changes of the spine. Bilateral glenohumeral osteoarthrosis partially visualized. IMPRESSION: Left basilar atelectasis or scarring. No focal consolidation. Borderline cardiomegaly. Electronically Signed   By: Kristine Garbe M.D.   On: 12/07/2016 03:42    Procedures Procedures  (including critical care time)  Medications Ordered in ED Medications  oxyCODONE-acetaminophen (PERCOCET/ROXICET) 5-325 MG per tablet 1 tablet (1 tablet Oral Given 12/07/16 0550)  oxyCODONE-acetaminophen (PERCOCET/ROXICET) 5-325 MG per tablet (not administered)  acetaminophen (TYLENOL) tablet 650 mg (not administered)     Initial Impression / Assessment and Plan / ED Course  I have reviewed the triage vital signs and the nursing notes.  Pertinent labs & imaging results that were available during my care of the patient were reviewed by me and considered in my medical decision making (see chart for details).     76 y.o. male presents with Left sternal chest pain or starting 2 days ago occurring intermittently that is worse with coughing. He has had a dry cough with some mucus. No hemoptysis, has had intermittent shortness of breath since leaving cardiac rehabilitation. He had previously been taking Tylenol for chest wall pain and discontinued this therapy upon returning home at which time his symptoms worsened. No evidence of pneumonia or postoperative complication. EKG is unremarkable without signs of ischemia. Serial troponin measurement is negative. Bypass vessels appear to be patent and pain is reproducible suggesting local musculoskeletal pain after surgical procedure. We'll continue Tylenol for pain at home and given expectorant with cough suppression and to help with symptoms. Plan to follow up with PCP as needed and return precautions discussed for worsening or new concerning symptoms.   Final Clinical Impressions(s) / ED Diagnoses   Final diagnoses:  Chest wall pain following surgery    New Prescriptions New Prescriptions   ACETAMINOPHEN (TYLENOL) 500 MG TABLET    Take 2 tablets (1,000 mg total) by mouth every 6 (six) hours as needed.   DEXTROMETHORPHAN-GUAIFENESIN (MUCINEX DM) 30-600 MG 12HR TABLET    Take 1 tablet by mouth 2 (two) times daily.     Quillian Quince  Laneta Simmers, MD 12/07/16  [phone]

## 2016-12-08 ENCOUNTER — Telehealth: Payer: Self-pay | Admitting: Interventional Cardiology

## 2016-12-08 ENCOUNTER — Encounter: Payer: Self-pay | Admitting: Physician Assistant

## 2016-12-08 DIAGNOSIS — I2510 Atherosclerotic heart disease of native coronary artery without angina pectoris: Secondary | ICD-10-CM | POA: Diagnosis not present

## 2016-12-08 DIAGNOSIS — I219 Acute myocardial infarction, unspecified: Secondary | ICD-10-CM | POA: Diagnosis not present

## 2016-12-08 DIAGNOSIS — I130 Hypertensive heart and chronic kidney disease with heart failure and stage 1 through stage 4 chronic kidney disease, or unspecified chronic kidney disease: Secondary | ICD-10-CM | POA: Diagnosis not present

## 2016-12-08 DIAGNOSIS — I5032 Chronic diastolic (congestive) heart failure: Secondary | ICD-10-CM | POA: Diagnosis not present

## 2016-12-08 DIAGNOSIS — Z48812 Encounter for surgical aftercare following surgery on the circulatory system: Secondary | ICD-10-CM | POA: Diagnosis not present

## 2016-12-08 DIAGNOSIS — E1122 Type 2 diabetes mellitus with diabetic chronic kidney disease: Secondary | ICD-10-CM | POA: Diagnosis not present

## 2016-12-08 NOTE — Telephone Encounter (Signed)
Spoke with Laurey Arrow and advised him pt should not be taking Ranexa as it is not currently prescribed per medication list. Phineas Inches to remind pt per hospital d/c he should follow up with PCP as cough seemed to be the cause of CP.  Film/video editor appreciative for assistance.

## 2016-12-08 NOTE — Telephone Encounter (Signed)
Pet, physical therapist with [facility] is calling on behalf of patient, states that patient was having chest pains so he took renexa. Laurey Arrow would like to know if this is okay because it's not on the medication list. Please call, thanks.

## 2016-12-09 DIAGNOSIS — Z48812 Encounter for surgical aftercare following surgery on the circulatory system: Secondary | ICD-10-CM | POA: Diagnosis not present

## 2016-12-09 DIAGNOSIS — I5032 Chronic diastolic (congestive) heart failure: Secondary | ICD-10-CM | POA: Diagnosis not present

## 2016-12-09 DIAGNOSIS — E1122 Type 2 diabetes mellitus with diabetic chronic kidney disease: Secondary | ICD-10-CM | POA: Diagnosis not present

## 2016-12-09 DIAGNOSIS — I219 Acute myocardial infarction, unspecified: Secondary | ICD-10-CM | POA: Diagnosis not present

## 2016-12-09 DIAGNOSIS — I2510 Atherosclerotic heart disease of native coronary artery without angina pectoris: Secondary | ICD-10-CM | POA: Diagnosis not present

## 2016-12-09 DIAGNOSIS — I130 Hypertensive heart and chronic kidney disease with heart failure and stage 1 through stage 4 chronic kidney disease, or unspecified chronic kidney disease: Secondary | ICD-10-CM | POA: Diagnosis not present

## 2016-12-12 ENCOUNTER — Other Ambulatory Visit: Payer: Self-pay | Admitting: Thoracic Surgery (Cardiothoracic Vascular Surgery)

## 2016-12-12 DIAGNOSIS — I5032 Chronic diastolic (congestive) heart failure: Secondary | ICD-10-CM | POA: Diagnosis not present

## 2016-12-12 DIAGNOSIS — I2510 Atherosclerotic heart disease of native coronary artery without angina pectoris: Secondary | ICD-10-CM | POA: Diagnosis not present

## 2016-12-12 DIAGNOSIS — Z48812 Encounter for surgical aftercare following surgery on the circulatory system: Secondary | ICD-10-CM | POA: Diagnosis not present

## 2016-12-12 DIAGNOSIS — I130 Hypertensive heart and chronic kidney disease with heart failure and stage 1 through stage 4 chronic kidney disease, or unspecified chronic kidney disease: Secondary | ICD-10-CM | POA: Diagnosis not present

## 2016-12-12 DIAGNOSIS — I219 Acute myocardial infarction, unspecified: Secondary | ICD-10-CM | POA: Diagnosis not present

## 2016-12-12 DIAGNOSIS — E1122 Type 2 diabetes mellitus with diabetic chronic kidney disease: Secondary | ICD-10-CM | POA: Diagnosis not present

## 2016-12-12 DIAGNOSIS — Z951 Presence of aortocoronary bypass graft: Secondary | ICD-10-CM

## 2016-12-13 ENCOUNTER — Encounter: Payer: Self-pay | Admitting: Thoracic Surgery (Cardiothoracic Vascular Surgery)

## 2016-12-13 ENCOUNTER — Ambulatory Visit
Admission: RE | Admit: 2016-12-13 | Discharge: 2016-12-13 | Disposition: A | Discharge status: Home / Self Care | Payer: Medicare Other | Source: Ambulatory Visit | Attending: Thoracic Surgery (Cardiothoracic Vascular Surgery) | Admitting: Thoracic Surgery (Cardiothoracic Vascular Surgery)

## 2016-12-13 ENCOUNTER — Ambulatory Visit (INDEPENDENT_AMBULATORY_CARE_PROVIDER_SITE_OTHER): Payer: Self-pay | Admitting: Thoracic Surgery (Cardiothoracic Vascular Surgery)

## 2016-12-13 VITALS — BP 152/70 | HR 79 | Resp 18 | Ht 68.0 in | Wt 264.0 lb

## 2016-12-13 DIAGNOSIS — Z951 Presence of aortocoronary bypass graft: Secondary | ICD-10-CM

## 2016-12-13 DIAGNOSIS — R05 Cough: Secondary | ICD-10-CM | POA: Diagnosis not present

## 2016-12-13 NOTE — Progress Notes (Signed)
[address]       [place] [address]             [phone]       HPI: Edward [name] returns today for a scheduled postoperative follow-up visit  He is a 76 year old man with known coronary disease who presented with unstable angina. Catheterization showed left main disease. He underwent coronary bypass grafting 2 on 11/09/2016. His postoperative course was unremarkable. He went to a SNF on postop day #6.   He went to the emergency room last week having sharp left-sided chest pain. Troponins were negative and his EKGs were unremarkable. He has not had any further pain of that nature since then. He has some discomfort from his sternum but is not taking any narcotics for that.  Past Medical History:  Diagnosis Date  . Adenomatous colon polyp 04/2011  . CAD (coronary artery disease)    a. 01/2015 DES to LAD  b. 12/2015: [place] 85% oLCx lesion--> rx therapy.  . Chronic diastolic CHF (congestive heart failure) ([place])   . CKD (chronic kidney disease), stage II    a. probable CKD II-III with baseline CR 1.1-1.3.  . DM type 2 (diabetes mellitus, type 2), insulin dependent 01/29/2014   fasting cbg 50-120 with new regimen  . Dyslipidemia, goal LDL below 70 01/29/2014  . Enlarged prostate   . Essential tremor    a. on proprnolol  . GERD (gastroesophageal reflux disease)   . Hypertension   . Internal hemorrhoid   . Sleep apnea    does not use cpap (06/04/2015)  . TIA (transient ischemic attack) 2002     Current Outpatient Prescriptions  Medication Sig Dispense Refill  . acetaminophen (TYLENOL) 500 MG tablet Take 2 tablets (1,000 mg total) by mouth every 6 (six) hours as needed. 30 tablet 0  . amLODipine (NORVASC) 10 MG tablet Take 1 tablet (10 mg total) by mouth daily. 30 tablet 11  . aspirin EC 81 MG tablet Take 81 mg by mouth daily.    Marland Kitchen atorvastatin (LIPITOR) 80 MG tablet Take 1 tablet (80 mg total) by mouth daily.    . clonazePAM (KLONOPIN) 0.5 MG tablet Take 1 tablet  (0.5 mg total) by mouth 2 (two) times daily as needed (tremors). 45 tablet 3  . diclofenac sodium (VOLTAREN) 1 % GEL Apply 2 g topically 4 (four) times daily as needed (for pain).     Marland Kitchen dicyclomine (BENTYL) 10 MG capsule Take 1 capsule (10 mg total) by mouth 3 (three) times daily before meals. 90 capsule 5  . EFFIENT 10 MG TABS tablet take 1 tablet by mouth once daily 30 tablet 2  . fenofibrate 160 MG tablet Take 1 tablet (160 mg total) by mouth daily.  0  . fluticasone furoate-vilanterol (BREO ELLIPTA) 100-25 MCG/INH AEPB Inhale 1 puff into the lungs daily. 60 each 5  . furosemide (LASIX) 40 MG tablet Take 1 tablet (40 mg total) by mouth daily as needed for fluid. (Patient taking differently: Take 40 mg by mouth daily. )    . gabapentin (NEURONTIN) 300 MG capsule Take 1 tablet at bedtime for one week then increase to 1 tablet twice daily (Patient taking differently: Take 300 mg by mouth 2 (two) times daily. ) 60 capsule 11  . insulin detemir (LEVEMIR) 100 UNIT/ML injection Inject 0.7 mLs (70 Units total) into the skin 2 (two) times daily. (Patient taking differently: Inject 40-60 Units into the skin 3 (three) times daily.  60 units in the morning, 40 units with lunch and 60 units in the evening) 10 mL 11  . lisinopril (PRINIVIL,ZESTRIL) 10 MG tablet Take 10 mg by mouth daily.  0  . meclizine (ANTIVERT) 25 MG tablet Take 1 tablet (25 mg total) by mouth 3 (three) times daily as needed for dizziness (vertigo). 30 tablet 0  . metoprolol (LOPRESSOR) 25 MG tablet Take 1 tablet (25 mg total) by mouth 2 (two) times daily.    . pantoprazole (PROTONIX) 40 MG tablet Take 40 mg by mouth daily.  1  . Polyvinyl Alcohol-Povidone (REFRESH OP) Place 1 drop into both eyes daily as needed (for dry eyes). Reported on 12/25/2015    . potassium chloride SA (K-DUR,KLOR-CON) 20 MEQ tablet Take 1 tablet (20 mEq total) by mouth daily as needed. Only take if takes Lasix 40 mg daily. (Patient taking differently: Take 20 mEq by  mouth every other day. Only take if takes Lasix 40 mg daily.) 30 tablet 11  . VENTOLIN HFA 108 (90 BASE) MCG/ACT inhaler Inhale 2 puffs into the lungs every 6 (six) hours as needed for wheezing or shortness of breath.   0  . oxyCODONE (OXY IR/ROXICODONE) 5 MG immediate release tablet Take 5 mg by mouth every 4-6 hours PRN severe pain. (Patient not taking: Reported on 12/13/2016) 30 tablet 0   No current facility-administered medications for this visit.     Physical Exam BP (!) 152/70 (BP Location: Right Arm, Patient Position: Sitting, Cuff Size: Large)   Pulse 79   Resp 18   Ht 5\' 8"  (1.727 m)   Wt 264 lb (119.7 kg)   SpO2 99% Comment: RA  BMI 40.14 kg/m  Obese 76 year old male in no acute distress Alert and oriented 3 with no focal deficits Lungs diminished at bases otherwise clear Cardiac regular rate and rhythm normal S1 and S2 Sternum stable, incision healing well Leg incisions clean dry and intact  Diagnostic Tests: CHEST  2 VIEW  COMPARISON:  12/07/2016  FINDINGS: Previous median sternotomy and CABG. Heart size at the upper limits of normal. No heart failure or effusion. Patchy density in the left lower lobe consistent with persistent atelectasis and/or pneumonia.  IMPRESSION: Persistent abnormal density in the left lower lobe that could be atelectasis and/or pneumonia.   Electronically Signed   By: Edward [name] M.D.   On: 12/13/2016 09:48 I personally reviewed the chest x-ray images and concur with above.  Impression: 62 gentleman who is now about a month out from coronary bypass grafting 2 for left main disease with unstable angina. Overall, all things considered, I think he's doing very well. I suspect that the left-sided chest pain he was having was related to the surgery. There was no evidence of ischemia. He was have a significant for short period time but now is back at home. His incisions are healing well and his sternum is stable.  He should avoid  lifting anything over 10 pounds for another 2 weeks. Beyond that there are no restrictions on his activities, but he should build into new activities gradually.  He will check drugstore medical supply store to see if he can find a new incentive spirometer. His did not make it home with him from the nursing facility.  Plan: He has an appointment coming up with Dr. Daneen Schick his cardiologist.  I will be happy to see Edward [name] back any time if I can be of any further assistance with his care.  Edward Nakayama,  MD [facility] [phone]

## 2016-12-14 ENCOUNTER — Ambulatory Visit: Payer: Medicare Other | Admitting: Internal Medicine

## 2016-12-14 DIAGNOSIS — Z48812 Encounter for surgical aftercare following surgery on the circulatory system: Secondary | ICD-10-CM | POA: Diagnosis not present

## 2016-12-14 DIAGNOSIS — I2510 Atherosclerotic heart disease of native coronary artery without angina pectoris: Secondary | ICD-10-CM | POA: Diagnosis not present

## 2016-12-14 DIAGNOSIS — I219 Acute myocardial infarction, unspecified: Secondary | ICD-10-CM | POA: Diagnosis not present

## 2016-12-14 DIAGNOSIS — I130 Hypertensive heart and chronic kidney disease with heart failure and stage 1 through stage 4 chronic kidney disease, or unspecified chronic kidney disease: Secondary | ICD-10-CM | POA: Diagnosis not present

## 2016-12-14 DIAGNOSIS — E1122 Type 2 diabetes mellitus with diabetic chronic kidney disease: Secondary | ICD-10-CM | POA: Diagnosis not present

## 2016-12-14 DIAGNOSIS — I5032 Chronic diastolic (congestive) heart failure: Secondary | ICD-10-CM | POA: Diagnosis not present

## 2016-12-15 ENCOUNTER — Telehealth: Payer: Self-pay | Admitting: Internal Medicine

## 2016-12-15 DIAGNOSIS — I130 Hypertensive heart and chronic kidney disease with heart failure and stage 1 through stage 4 chronic kidney disease, or unspecified chronic kidney disease: Secondary | ICD-10-CM | POA: Diagnosis not present

## 2016-12-15 DIAGNOSIS — I2510 Atherosclerotic heart disease of native coronary artery without angina pectoris: Secondary | ICD-10-CM | POA: Diagnosis not present

## 2016-12-15 DIAGNOSIS — I219 Acute myocardial infarction, unspecified: Secondary | ICD-10-CM | POA: Diagnosis not present

## 2016-12-15 DIAGNOSIS — Z48812 Encounter for surgical aftercare following surgery on the circulatory system: Secondary | ICD-10-CM | POA: Diagnosis not present

## 2016-12-15 DIAGNOSIS — I5032 Chronic diastolic (congestive) heart failure: Secondary | ICD-10-CM | POA: Diagnosis not present

## 2016-12-15 DIAGNOSIS — E1122 Type 2 diabetes mellitus with diabetic chronic kidney disease: Secondary | ICD-10-CM | POA: Diagnosis not present

## 2016-12-15 NOTE — Telephone Encounter (Signed)
He is scheduled tomorrow pm

## 2016-12-15 NOTE — Telephone Encounter (Signed)
Patient no showed today's appt. Please advise on how to follow up. °A. No follow up necessary. °B. Follow up urgent. Contact patient immediately. °C. Follow up necessary. Contact patient and schedule visit in ___ days. °D. Follow up advised. Contact patient and schedule visit in ____weeks. ° °

## 2016-12-16 ENCOUNTER — Telehealth: Payer: Self-pay | Admitting: *Deleted

## 2016-12-16 ENCOUNTER — Encounter: Payer: Self-pay | Admitting: Internal Medicine

## 2016-12-16 ENCOUNTER — Ambulatory Visit (INDEPENDENT_AMBULATORY_CARE_PROVIDER_SITE_OTHER): Payer: Medicare Other | Admitting: Internal Medicine

## 2016-12-16 ENCOUNTER — Other Ambulatory Visit: Payer: Self-pay | Admitting: Interventional Cardiology

## 2016-12-16 VITALS — BP 134/84 | HR 77 | Ht 67.0 in | Wt 266.0 lb

## 2016-12-16 DIAGNOSIS — I5032 Chronic diastolic (congestive) heart failure: Secondary | ICD-10-CM | POA: Diagnosis not present

## 2016-12-16 DIAGNOSIS — E1159 Type 2 diabetes mellitus with other circulatory complications: Secondary | ICD-10-CM | POA: Diagnosis not present

## 2016-12-16 DIAGNOSIS — Z794 Long term (current) use of insulin: Secondary | ICD-10-CM

## 2016-12-16 DIAGNOSIS — Z48812 Encounter for surgical aftercare following surgery on the circulatory system: Secondary | ICD-10-CM | POA: Diagnosis not present

## 2016-12-16 DIAGNOSIS — I130 Hypertensive heart and chronic kidney disease with heart failure and stage 1 through stage 4 chronic kidney disease, or unspecified chronic kidney disease: Secondary | ICD-10-CM | POA: Diagnosis not present

## 2016-12-16 DIAGNOSIS — E1122 Type 2 diabetes mellitus with diabetic chronic kidney disease: Secondary | ICD-10-CM | POA: Diagnosis not present

## 2016-12-16 DIAGNOSIS — I2510 Atherosclerotic heart disease of native coronary artery without angina pectoris: Secondary | ICD-10-CM | POA: Diagnosis not present

## 2016-12-16 DIAGNOSIS — I219 Acute myocardial infarction, unspecified: Secondary | ICD-10-CM | POA: Diagnosis not present

## 2016-12-16 LAB — POCT GLYCOSYLATED HEMOGLOBIN (HGB A1C): HEMOGLOBIN A1C: 7.7

## 2016-12-16 MED ORDER — INSULIN LISPRO 100 UNIT/ML ~~LOC~~ SOLN: 15.0000 [IU] | Freq: Three times a day (TID) | SUBCUTANEOUS | 5 refills | Status: DC

## 2016-12-16 MED ORDER — "INSULIN SYRINGE-NEEDLE U-100 30G X 1/2"" 0.5 ML MISC": 5 refills | Status: DC

## 2016-12-16 MED ORDER — INSULIN DETEMIR 100 UNIT/ML ~~LOC~~ SOLN: 70.0000 [IU] | Freq: Two times a day (BID) | SUBCUTANEOUS | 11 refills | Status: DC

## 2016-12-16 NOTE — Patient Instructions (Signed)
Please decrease Levemir to 70 units at bedtime.  Start Humalog/Novolog 15 min before meals: - 15 units before a smaller meal - 20 units before a larger meal (-25 units before a very large meal)  Please return in 1.5 months with your sugar log.

## 2016-12-16 NOTE — Progress Notes (Signed)
Patient ID: Edward [name], male   DOB: 11-24-1940, 76 y.o.   MRN: [id number]  HPI: Edward [name] is a 76 y.o.-year-old male, returning for f/u for DM2,  dx 1987, insulin-dependent since 1989, uncontrolled, with complications (CAD - s/p 2 AMIs, PN). He moved from [place] in 12/2013. Last visit 3 mo ago.  Since last visit, he was admitted with CP >> had CABG x 2 11/09/2016. He is now in Home PT. He feels tired, but OTW feels well. No CP, SOB.  His brother is a Biomedical scientist >> he is cooking for him.  Reviewed hemoglobin A1c levels: 09/13/2016: HbA1c calculated from fructosamine is actually 6.86% Lab Results  Component Value Date   HGBA1C 9.4 09/13/2016   HGBA1C 9.4 06/13/2016   HGBA1C 9.0 01/18/2016    He was on:  Insulin  Before breakfast  Before lunch  Before dinner   Novolin 70/30 (vial) 70 (instead of 40) 70 70 (instead of 60)  Novolog He was on Metformin >> N/V/D.  After discharge, he is on: - Levemir 70 units in am and 70 units at bedtime  Pt checks his sugars 3-4 a day (no log): - am: 100-140 >> 68, 110-115 >> 170s >> 132, 140-160 >> 110, 122-160, 182 - 2h after b'fast: 128-263, 380 >> 170-180 >> 120-125 >> n/c - before lunch:170s >> 130-200 >> 170-180 >> 130-140 >> 140-180 >> 130s - 2h after lunch:  198-309 >> 121-254 >> 170-180s >> n/c >> n/c - before dinner: 150-200 >> 150-160, 200 >> 180-190 >> 100-110 >> 170s - 2h after dinner: n/c >> 301-379>> 99, 128, 368 >> n/c >> 280 >> n/c - bedtime: 190-200 >> 93-198 >> n/c >> 160s >> n/c >> 140-165 >> n/c - nighttime: n/c >> 300s >> 139-339 >> n/c >> 52 x2 >> 83 >> occas. 87 >> 80s Lowest sugar was 60 >> 60 x1 >> 80s; he has hypoglycemia awareness at 100. Highest sugar was 250 >> 210.  Has a One Touch Ultra meter.   - + CKD, last BUN/creatinine:  Lab Results  Component Value Date   BUN 17 12/07/2016   CREATININE 1.35 (H) 12/07/2016  Pt is seen by nephrology b/c proteinuria (555 mg/24h). He was started on Avapro by Dr. Hollie Salk. - Has HL.  Last set of lipids: Lab Results  Component Value Date   CHOL 130 10/30/2016   HDL 33 (L) 10/30/2016   LDLCALC 78 10/30/2016   LDLDIRECT 144.0 07/08/2015   TRIG 93 10/30/2016   CHOLHDL 3.9 10/30/2016  Not on a statin. He gets leg cramps from Lipitor >> could not tolerate it in the past. Now restarted inhouse. - last eye exam was in 02/29/2016 (Dr Zigmund Daniel). Has DR. Had cataracts sx.  - + numbness and tingling in his feet. He saw podiatrist (05/2015) >> has PN. On ASA 81.  Pt was admitted with CP 12/18/2015 >> had another AMI. He was started on Ranexa and Effient, his dose of Imdur was increased.   I reviewed pt's medications, allergies, PMH, social hx, family hx, and changes were documented in the history of present illness. Otherwise, unchanged from my initial visit note, except he started Effient.  ROS: Constitutional: + weight loss, +  fatigue, no hot flushes, + nocturia Eyes: no blurry vision, no xerophthalmia ENT: no sore throat, no nodules palpated in throat, no dysphagia/odynophagia, no hoarseness Cardiovascular: less CP/SOB/no palpitations/+ leg swelling Respiratory: no cough/SOB/wheezing Gastrointestinal: no N/V/D/ Musculoskeletal: + muscle aches/+ joint aches  Skin: no rash  Neurological: + tremors/no numbness/tingling/dizziness  PE: BP 134/84 (BP Location: Left Arm, Patient Position: Sitting)   Pulse 77   Ht 5\' 7"  (1.702 m)   Wt 266 lb (120.7 kg)   SpO2 98%   BMI 41.66 kg/m  Body mass index is 41.66 kg/m. Wt Readings from Last 3 Encounters:  12/16/16 266 lb (120.7 kg)  12/13/16 264 lb (119.7 kg)  12/07/16 264 lb (119.7 kg)   Constitutional: obese, in NAD Eyes: PERRLA, EOMI, no exophthalmos ENT: moist mucous membranes, no thyromegaly, no cervical lymphadenopathy Cardiovascular: RRR, No MRG, + B pitting leg swelling  Respiratory: CTA B Gastrointestinal: abdomen soft, NT, ND, BS+ Musculoskeletal: no deformities, strength intact in all 4 Skin: moist, warm, no  rashes Neurological: no tremor with outstretched hands, DTR normal in all 4  ASSESSMENT: 1. DM2, insulin-dependent, uncontrolled, with complications - CAD H/o steroid inj's  Cardiologist: Dr Tamala Julian. Pulmonologist: Dr Lake Bells  PLAN:  1. Patient with long-standing, uncontrolled diabetes, previously on U500 insulin, then on Novolin 70/30, and now on Levemir only. Sugars are much better after his CABG but not at goal. He is on a large Levemir dose with subsequent mild lows at night and high sugars as the day goes by >> will decrease Levemir and add rapid-acting insulin. - HbA1c today: 7.7% (Much better).. - I suggested to:  Patient Instructions  Please decrease Levemir to 70 units at bedtime.  Start Humalog/Novolog 15 min before meals: - 15 units before a smaller meal - 20 units before a larger meal (-25 units before a very large meal)  Please return in 1.5 months with your sugar log.   - continue checking sugars at different times of the day - check 3-4 times a day, rotating checks - up to date with yearly eye exams  - Return to clinic in 1.5 mo with sugar log   Philemon Kingdom, MD PhD [facility] Endocrinology

## 2016-12-16 NOTE — Telephone Encounter (Signed)
Received completed and signed Home Health Certification and Plan of Care from Dr. Carollee Herter.  Paperwork faxed to [facility] and confirmation received.//AB/CMA

## 2016-12-16 NOTE — Addendum Note (Signed)
Addended by: Caprice Beaver T on: 12/16/2016 04:27 PM   Modules accepted: Orders

## 2016-12-19 DIAGNOSIS — I5032 Chronic diastolic (congestive) heart failure: Secondary | ICD-10-CM | POA: Diagnosis not present

## 2016-12-19 DIAGNOSIS — I2510 Atherosclerotic heart disease of native coronary artery without angina pectoris: Secondary | ICD-10-CM | POA: Diagnosis not present

## 2016-12-19 DIAGNOSIS — I219 Acute myocardial infarction, unspecified: Secondary | ICD-10-CM | POA: Diagnosis not present

## 2016-12-19 DIAGNOSIS — I130 Hypertensive heart and chronic kidney disease with heart failure and stage 1 through stage 4 chronic kidney disease, or unspecified chronic kidney disease: Secondary | ICD-10-CM | POA: Diagnosis not present

## 2016-12-19 DIAGNOSIS — Z48812 Encounter for surgical aftercare following surgery on the circulatory system: Secondary | ICD-10-CM | POA: Diagnosis not present

## 2016-12-19 DIAGNOSIS — E1122 Type 2 diabetes mellitus with diabetic chronic kidney disease: Secondary | ICD-10-CM | POA: Diagnosis not present

## 2016-12-21 DIAGNOSIS — I2510 Atherosclerotic heart disease of native coronary artery without angina pectoris: Secondary | ICD-10-CM | POA: Diagnosis not present

## 2016-12-21 DIAGNOSIS — I219 Acute myocardial infarction, unspecified: Secondary | ICD-10-CM | POA: Diagnosis not present

## 2016-12-21 DIAGNOSIS — Z48812 Encounter for surgical aftercare following surgery on the circulatory system: Secondary | ICD-10-CM | POA: Diagnosis not present

## 2016-12-21 DIAGNOSIS — E1122 Type 2 diabetes mellitus with diabetic chronic kidney disease: Secondary | ICD-10-CM | POA: Diagnosis not present

## 2016-12-21 DIAGNOSIS — I5032 Chronic diastolic (congestive) heart failure: Secondary | ICD-10-CM | POA: Diagnosis not present

## 2016-12-21 DIAGNOSIS — I130 Hypertensive heart and chronic kidney disease with heart failure and stage 1 through stage 4 chronic kidney disease, or unspecified chronic kidney disease: Secondary | ICD-10-CM | POA: Diagnosis not present

## 2016-12-22 DIAGNOSIS — I219 Acute myocardial infarction, unspecified: Secondary | ICD-10-CM | POA: Diagnosis not present

## 2016-12-22 DIAGNOSIS — Z48812 Encounter for surgical aftercare following surgery on the circulatory system: Secondary | ICD-10-CM | POA: Diagnosis not present

## 2016-12-22 DIAGNOSIS — I5032 Chronic diastolic (congestive) heart failure: Secondary | ICD-10-CM | POA: Diagnosis not present

## 2016-12-22 DIAGNOSIS — I130 Hypertensive heart and chronic kidney disease with heart failure and stage 1 through stage 4 chronic kidney disease, or unspecified chronic kidney disease: Secondary | ICD-10-CM | POA: Diagnosis not present

## 2016-12-22 DIAGNOSIS — E1122 Type 2 diabetes mellitus with diabetic chronic kidney disease: Secondary | ICD-10-CM | POA: Diagnosis not present

## 2016-12-22 DIAGNOSIS — I2510 Atherosclerotic heart disease of native coronary artery without angina pectoris: Secondary | ICD-10-CM | POA: Diagnosis not present

## 2016-12-22 NOTE — Progress Notes (Signed)
Cardiology Office Note    Date:  12/23/2016   ID:  [name], [name] [birth year], MRN [id number]  PCP:  Ann Held, DO  Cardiologist: Sinclair Grooms, MD   Chief Complaint  Patient presents with  . Coronary Artery Disease    History of Present Illness:  Edward [name] is a 76 y.o. male who presents for follow-up of CAD with April 2016 drug-eluting stent and February 2017 unstable angina due to high-grade circumflex being treated with medical therapy. CABG x2 for ostial circumflex disease. Also, chronic diastolic heart failure, hypertension, suspected sleep apnea, and obesity  He had two-vessel bypass because of progression of left main disease. 18 months ago and LAD stent was placed. Surgery performed in December was with LIMA to LAD and SVG to circumflex. He was discharged on Effient. He is unable to tolerate Brilinta. He has chest soreness. He has mild shortness of breath. He has not had any prolonged palpitations. He denies lower extremity swelling. Incisions are healing nicely.   Past Medical History:  Diagnosis Date  . Adenomatous colon polyp 04/2011  . CAD (coronary artery disease)    a. 01/2015 DES to LAD  b. 12/2015: [place] 85% oLCx lesion--> rx therapy.  . Chronic diastolic CHF (congestive heart failure) ([place])   . CKD (chronic kidney disease), stage II    a. probable CKD II-III with baseline CR 1.1-1.3.  . DM type 2 (diabetes mellitus, type 2), insulin dependent 01/29/2014   fasting cbg 50-120 with new regimen  . Dyslipidemia, goal LDL below 70 01/29/2014  . Enlarged prostate   . Essential tremor    a. on proprnolol  . GERD (gastroesophageal reflux disease)   . Hypertension   . Internal hemorrhoid   . Sleep apnea    does not use cpap (06/04/2015)  . TIA (transient ischemic attack) 2002    Past Surgical History:  Procedure Laterality Date  . BACK SURGERY    . CARDIAC CATHETERIZATION  01/28/14   + CAD treat medically  . CARDIAC CATHETERIZATION N/A 12/21/2015   Procedure: Right/Left Heart Cath and Coronary Angiography;  Surgeon: Belva Crome, MD;  Location: [place] CV [facility];  Service: Cardiovascular;  Laterality: N/A;  . CARDIAC CATHETERIZATION N/A 11/02/2016   Procedure: Left Heart Cath and Coronary Angiography;  Surgeon: Nelva Bush, MD;  Location: [place] [facility];  Service: Cardiovascular;  Laterality: N/A;  . CATARACT EXTRACTION Bilateral   . CATARACT EXTRACTION, BILATERAL Bilateral   . CORONARY ANGIOPLASTY WITH STENT PLACEMENT  01/30/2015   DES Promus  [place] to LAD by Dr Tamala Julian  . CORONARY ARTERY BYPASS GRAFT N/A 11/09/2016   Procedure: CORONARY ARTERY BYPASS GRAFTING (CABG) x 2;  Surgeon: Melrose Nakayama, MD;  Location: [place];  Service: Open Heart Surgery;  Laterality: N/A;  . ENDOVEIN HARVEST OF GREATER SAPHENOUS VEIN Left 11/09/2016   Procedure: ENDOVEIN HARVEST OF GREATER SAPHENOUS VEIN;  Surgeon: Melrose Nakayama, MD;  Location: [place];  Service: Open Heart Surgery;  Laterality: Left;  . JOINT REPLACEMENT    . LEFT HEART CATHETERIZATION WITH CORONARY ANGIOGRAM N/A 01/28/2014   Procedure: LEFT HEART CATHETERIZATION WITH CORONARY ANGIOGRAM;  Surgeon: Sinclair Grooms, MD;  Location: [facility] CATH LAB;  Service: Cardiovascular;  Laterality: N/A;  . LEFT HEART CATHETERIZATION WITH CORONARY ANGIOGRAM N/A 01/30/2015   Procedure: LEFT HEART CATHETERIZATION WITH CORONARY ANGIOGRAM;  Surgeon: Belva Crome, MD;  Location: [facility] CATH LAB;  Service: Cardiovascular;  Laterality: N/A;  .  LUMBAR DISC SURGERY    . SHOULDER ARTHROSCOPY Right 07/30/2014   Procedure: Right Shoulder Arthroscopy, Debridement, Decompression, Manipulation Under Anesthesia;  Surgeon: Newt Minion, MD;  Location: [place];  Service: Orthopedics;  Laterality: Right;  . TEE WITHOUT CARDIOVERSION N/A 11/09/2016   Procedure: TRANSESOPHAGEAL ECHOCARDIOGRAM (TEE);  Surgeon: Melrose Nakayama, MD;  Location: [place];  Service: Open Heart Surgery;  Laterality: N/A;  . TOTAL KNEE  ARTHROPLASTY Bilateral     Current Medications: Outpatient Medications Prior to Visit  Medication Sig Dispense Refill  . acetaminophen (TYLENOL) 500 MG tablet Take 2 tablets (1,000 mg total) by mouth every 6 (six) hours as needed. 30 tablet 0  . amLODipine (NORVASC) 10 MG tablet Take 1 tablet (10 mg total) by mouth daily. 30 tablet 11  . aspirin EC 81 MG tablet Take 81 mg by mouth daily.    Marland Kitchen atorvastatin (LIPITOR) 80 MG tablet Take 1 tablet (80 mg total) by mouth daily.    . clonazePAM (KLONOPIN) 0.5 MG tablet Take 1 tablet (0.5 mg total) by mouth 2 (two) times daily as needed (tremors). 45 tablet 3  . diclofenac sodium (VOLTAREN) 1 % GEL Apply 2 g topically 4 (four) times daily as needed (for pain).     Marland Kitchen dicyclomine (BENTYL) 10 MG capsule Take 1 capsule (10 mg total) by mouth 3 (three) times daily before meals. 90 capsule 5  . fenofibrate 160 MG tablet Take 1 tablet (160 mg total) by mouth daily.  0  . fluticasone furoate-vilanterol (BREO ELLIPTA) 100-25 MCG/INH AEPB Inhale 1 puff into the lungs daily. 60 each 5  . furosemide (LASIX) 40 MG tablet Take 1 tablet (40 mg total) by mouth daily as needed for fluid. (Patient taking differently: Take 40 mg by mouth daily. )    . furosemide (LASIX) 40 MG tablet take 1 tablet by mouth once daily 90 tablet 1  . gabapentin (NEURONTIN) 300 MG capsule Take 1 tablet at bedtime for one week then increase to 1 tablet twice daily (Patient taking differently: Take 300 mg by mouth 2 (two) times daily. ) 60 capsule 11  . insulin detemir (LEVEMIR) 100 UNIT/ML injection Inject 0.7 mLs (70 Units total) into the skin 2 (two) times daily. 10 mL 11  . insulin lispro (HUMALOG) 100 UNIT/ML injection Inject 0.15-0.25 mLs (15-25 Units total) into the skin 3 (three) times daily before meals. 20 mL 5  . Insulin Syringe-Needle U-100 (B-D INS SYRINGE 0.5CC/30GX1/2") 30G X 1/2" 0.5 ML MISC Use 4x a day 300 each 5  . meclizine (ANTIVERT) 25 MG tablet Take 1 tablet (25 mg total)  by mouth 3 (three) times daily as needed for dizziness (vertigo). 30 tablet 0  . oxyCODONE (OXY IR/ROXICODONE) 5 MG immediate release tablet Take 5 mg by mouth every 4-6 hours PRN severe pain. 30 tablet 0  . pantoprazole (PROTONIX) 40 MG tablet Take 40 mg by mouth daily.  1  . Polyvinyl Alcohol-Povidone (REFRESH OP) Place 1 drop into both eyes daily as needed (for dry eyes). Reported on 12/25/2015    . potassium chloride SA (K-DUR,KLOR-CON) 20 MEQ tablet Take 1 tablet (20 mEq total) by mouth daily as needed. Only take if takes Lasix 40 mg daily. (Patient taking differently: Take 20 mEq by mouth every other day. Only take if takes Lasix 40 mg daily.) 30 tablet 11  . VENTOLIN HFA 108 (90 BASE) MCG/ACT inhaler Inhale 2 puffs into the lungs every 6 (six) hours as needed for wheezing or shortness  of breath.   0  . EFFIENT 10 MG TABS tablet take 1 tablet by mouth once daily 30 tablet 2  . lisinopril (PRINIVIL,ZESTRIL) 10 MG tablet Take 10 mg by mouth daily.  0  . metoprolol (LOPRESSOR) 25 MG tablet Take 1 tablet (25 mg total) by mouth 2 (two) times daily.     No facility-administered medications prior to visit.      Allergies:   No known allergies   Social History   Social History  . Marital status: Married    Spouse name: N/A  . Number of children: 0  . Years of education: N/A   Occupational History  . retired    Social History Main Topics  . Smoking status: Never Smoker  . Smokeless tobacco: Never Used  . Alcohol use No  . Drug use: No  . Sexual activity: No   Other Topics Concern  . None   Social History Narrative   He is a Geophysicist/field seismologist by trade.   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   He lives alone.  His wife is living in [place] at this time.  They do not have children.   Highest level of education:  College graduate     Family History:  The patient's family history includes Alzheimer's disease in his sister; Asthma in his brother; CAD in his  brother and sister; Diabetes in his other; Prostate cancer in his brother.   ROS:   Please see the history of present illness.    He is having up to 3 bowel movements per day, loose, and green colored. He denies chills and fever. He has a dry hacking cough. Back pain and muscle pain. Musculoskeletal chest pain. Fatigue and anxiety.  All other systems reviewed and are negative.   PHYSICAL EXAM:   VS:  BP 140/76 (BP Location: Left Arm)   Pulse 68   Ht 5\' 8"  (1.727 m)   Wt 264 lb 12.8 oz (120.1 kg)   BMI 40.26 kg/m    GEN: Well nourished, well developed, in no acute distress . Moderate obesity. HEENT: normal  Neck: no JVD, carotid bruits, or masses Cardiac: RRR; no murmurs, rubs, or gallops,no edema  Respiratory:  clear to auscultation bilaterally, normal work of breathing GI: soft, nontender, nondistended, + BS MS: no deformity or atrophy  Skin: warm and dry, no rash Neuro:  Alert and Oriented x 3, Strength and sensation are intact Psych: euthymic mood, full affect  Wt Readings from Last 3 Encounters:  12/23/16 264 lb 12.8 oz (120.1 kg)  12/16/16 266 lb (120.7 kg)  12/13/16 264 lb (119.7 kg)      Studies/Labs Reviewed:   EKG:  EKG  Not performed  Recent Labs: 10/29/2016: ALT 16; B Natriuretic Peptide 21.4; TSH 2.344 11/10/2016: Magnesium 1.9 12/07/2016: BUN 17; Creatinine, Ser 1.35; Hemoglobin 10.4; Platelets 292; Potassium 3.7; Sodium 141   Lipid Panel    Component Value Date/Time   CHOL 130 10/30/2016 0701   TRIG 93 10/30/2016 0701   HDL 33 (L) 10/30/2016 0701   CHOLHDL 3.9 10/30/2016 0701   VLDL 19 10/30/2016 0701   LDLCALC 78 10/30/2016 0701   LDLDIRECT 144.0 07/08/2015 1344    Additional studies/ records that were reviewed today include:  Diagnostic Diagram 11/02/2016         ASSESSMENT:    1. Chronic diastolic HF (heart failure) ([place])   2. Atherosclerosis of native coronary artery of native heart with unstable angina pectoris (  [place])   3.  Dyslipidemia, goal LDL below 70   4. S/P CABG x 2   5. Essential hypertension   6. ACE-inhibitor cough      PLAN:  In order of problems listed above:  1. No evidence of volume overload 2. Doing well now 2 months out from CABG with LIMA to LAD and SVG to circumflex. I note that he is on Effient and aspirin. We will change Effient Plavix to decrease bleeding risk. 3. Continue statin therapy for hyperlipidemia. 4. We discussed the musculoskeletal sternal and left parasternal discomfort that comes after bypass surgery. Also encouraged him to enroll in cardiac rehabilitation. 5. Blood pressure is borderline. We may need to further optimize therapy. This would be especially important as we discontinue lisinopril today due to cough, see below. 6. Discontinue lisinopril and consider starting ARB in 3-4 weeks of cough resolves. 7. Discussed loose bowel movements with primary care  Overall six-month follow-up. Enroll in cardiac rehabilitation. Discontinue metoprolol as the patient is on 40 mg 3 times a day of propranolol for tremor. Discontinue lisinopril due to cough. Consider starting an ARB for kidney protection in one month. Switch Effient Plavix. Discussed diarrhea with his PCP.  Medication Adjustments/Labs and Tests Ordered: Current medicines are reviewed at length with the patient today.  Concerns regarding medicines are outlined above.  Medication changes, Labs and Tests ordered today are listed in the Patient Instructions below. Patient Instructions  Medication Instructions:  1) DISCONTINUE Metoprolol 2) DISCONTINUE Lisinopril 3) DISCONTINUE Effient 4) START Plavix 75mg  once daily.   Labwork: None  Testing/Procedures: None  Follow-Up: Your physician wants you to follow-up in: 6 months with Dr. Tamala Julian. You will receive a reminder letter in the mail two months in advance. If you don't receive a letter, please call our office to schedule the follow-up appointment.   Any Other  Special Instructions Will Be Listed Below (If Applicable).     If you need a refill on your cardiac medications before your next appointment, please call your pharmacy.      Signed, Sinclair Grooms, MD  12/23/2016 11:37 AM    [facility] [employer], [place], [place]  [address] Phone: [phone]; Fax: [phone]

## 2016-12-23 ENCOUNTER — Encounter: Payer: Self-pay | Admitting: Interventional Cardiology

## 2016-12-23 ENCOUNTER — Ambulatory Visit (INDEPENDENT_AMBULATORY_CARE_PROVIDER_SITE_OTHER): Payer: Medicare Other | Admitting: Interventional Cardiology

## 2016-12-23 VITALS — BP 140/76 | HR 68 | Ht 68.0 in | Wt 264.8 lb

## 2016-12-23 DIAGNOSIS — T464X5A Adverse effect of angiotensin-converting-enzyme inhibitors, initial encounter: Secondary | ICD-10-CM | POA: Diagnosis not present

## 2016-12-23 DIAGNOSIS — I25110 Atherosclerotic heart disease of native coronary artery with unstable angina pectoris: Secondary | ICD-10-CM

## 2016-12-23 DIAGNOSIS — R05 Cough: Secondary | ICD-10-CM | POA: Diagnosis not present

## 2016-12-23 DIAGNOSIS — I5032 Chronic diastolic (congestive) heart failure: Secondary | ICD-10-CM | POA: Diagnosis not present

## 2016-12-23 DIAGNOSIS — E785 Hyperlipidemia, unspecified: Secondary | ICD-10-CM

## 2016-12-23 DIAGNOSIS — Z951 Presence of aortocoronary bypass graft: Secondary | ICD-10-CM | POA: Diagnosis not present

## 2016-12-23 DIAGNOSIS — I10 Essential (primary) hypertension: Secondary | ICD-10-CM

## 2016-12-23 DIAGNOSIS — I200 Unstable angina: Secondary | ICD-10-CM

## 2016-12-23 MED ORDER — CLOPIDOGREL BISULFATE 75 MG PO TABS: 75.0000 mg | ORAL_TABLET | Freq: Every day | ORAL | 3 refills | Status: DC

## 2016-12-23 NOTE — Patient Instructions (Addendum)
Medication Instructions:  1) DISCONTINUE Metoprolol 2) DISCONTINUE Lisinopril 3) DISCONTINUE Effient 4) START Plavix 75mg  once daily.   Labwork: None  Testing/Procedures: None  Follow-Up: Your physician wants you to follow-up in: 6 months with Dr. Tamala Julian. You will receive a reminder letter in the mail two months in advance. If you don't receive a letter, please call our office to schedule the follow-up appointment.   Any Other Special Instructions Will Be Listed Below (If Applicable).     If you need a refill on your cardiac medications before your next appointment, please call your pharmacy.

## 2016-12-26 DIAGNOSIS — I219 Acute myocardial infarction, unspecified: Secondary | ICD-10-CM | POA: Diagnosis not present

## 2016-12-26 DIAGNOSIS — I5032 Chronic diastolic (congestive) heart failure: Secondary | ICD-10-CM | POA: Diagnosis not present

## 2016-12-26 DIAGNOSIS — Z48812 Encounter for surgical aftercare following surgery on the circulatory system: Secondary | ICD-10-CM | POA: Diagnosis not present

## 2016-12-26 DIAGNOSIS — I130 Hypertensive heart and chronic kidney disease with heart failure and stage 1 through stage 4 chronic kidney disease, or unspecified chronic kidney disease: Secondary | ICD-10-CM | POA: Diagnosis not present

## 2016-12-26 DIAGNOSIS — I2510 Atherosclerotic heart disease of native coronary artery without angina pectoris: Secondary | ICD-10-CM | POA: Diagnosis not present

## 2016-12-26 DIAGNOSIS — E1122 Type 2 diabetes mellitus with diabetic chronic kidney disease: Secondary | ICD-10-CM | POA: Diagnosis not present

## 2016-12-28 DIAGNOSIS — I2510 Atherosclerotic heart disease of native coronary artery without angina pectoris: Secondary | ICD-10-CM | POA: Diagnosis not present

## 2016-12-28 DIAGNOSIS — E1122 Type 2 diabetes mellitus with diabetic chronic kidney disease: Secondary | ICD-10-CM | POA: Diagnosis not present

## 2016-12-28 DIAGNOSIS — I219 Acute myocardial infarction, unspecified: Secondary | ICD-10-CM | POA: Diagnosis not present

## 2016-12-28 DIAGNOSIS — Z48812 Encounter for surgical aftercare following surgery on the circulatory system: Secondary | ICD-10-CM | POA: Diagnosis not present

## 2016-12-28 DIAGNOSIS — I130 Hypertensive heart and chronic kidney disease with heart failure and stage 1 through stage 4 chronic kidney disease, or unspecified chronic kidney disease: Secondary | ICD-10-CM | POA: Diagnosis not present

## 2016-12-28 DIAGNOSIS — I5032 Chronic diastolic (congestive) heart failure: Secondary | ICD-10-CM | POA: Diagnosis not present

## 2016-12-29 ENCOUNTER — Telehealth (HOSPITAL_COMMUNITY): Payer: Self-pay | Admitting: Family Medicine

## 2016-12-29 NOTE — Telephone Encounter (Signed)
Insurance benefits verified. Patient has Medicare and BCBS. BCBS, no co-payment, deductible $350.00/$350.00 has been met, out of pocket $5000.00/$5000.00 has been met, and co-insurance 15%. No pre-authorization and no limits on visits. Passport/reference # [id number]. Information given to [place] for review.  °

## 2016-12-30 ENCOUNTER — Other Ambulatory Visit: Payer: Self-pay

## 2016-12-30 DIAGNOSIS — I2510 Atherosclerotic heart disease of native coronary artery without angina pectoris: Secondary | ICD-10-CM | POA: Diagnosis not present

## 2016-12-30 DIAGNOSIS — I5032 Chronic diastolic (congestive) heart failure: Secondary | ICD-10-CM | POA: Diagnosis not present

## 2016-12-30 DIAGNOSIS — I130 Hypertensive heart and chronic kidney disease with heart failure and stage 1 through stage 4 chronic kidney disease, or unspecified chronic kidney disease: Secondary | ICD-10-CM | POA: Diagnosis not present

## 2016-12-30 DIAGNOSIS — I219 Acute myocardial infarction, unspecified: Secondary | ICD-10-CM | POA: Diagnosis not present

## 2016-12-30 DIAGNOSIS — Z48812 Encounter for surgical aftercare following surgery on the circulatory system: Secondary | ICD-10-CM | POA: Diagnosis not present

## 2016-12-30 DIAGNOSIS — E1122 Type 2 diabetes mellitus with diabetic chronic kidney disease: Secondary | ICD-10-CM | POA: Diagnosis not present

## 2016-12-30 MED ORDER — GLUCOSE BLOOD VI STRP: ORAL_STRIP | 5 refills | Status: DC

## 2016-12-30 MED ORDER — INSULIN ASPART 100 UNIT/ML ~~LOC~~ SOLN: SUBCUTANEOUS | 3 refills | Status: DC

## 2016-12-30 NOTE — Telephone Encounter (Signed)
Patient called and stated the pharmacy was needed a form for his test strips. Patient did not have a rx for the test strips, will call pharmacy to verify what they needed. Also patient asked to let pharmacy know which insulin he was on, which is Humalog at this time, as it is the only one on his med list.

## 2017-01-02 ENCOUNTER — Ambulatory Visit (INDEPENDENT_AMBULATORY_CARE_PROVIDER_SITE_OTHER): Payer: Medicare Other | Admitting: Family Medicine

## 2017-01-02 ENCOUNTER — Encounter: Payer: Self-pay | Admitting: Family Medicine

## 2017-01-02 VITALS — BP 118/62 | HR 73 | Temp 97.9°F | Resp 16 | Ht 68.0 in | Wt 261.0 lb

## 2017-01-02 DIAGNOSIS — E1159 Type 2 diabetes mellitus with other circulatory complications: Secondary | ICD-10-CM

## 2017-01-02 DIAGNOSIS — I25110 Atherosclerotic heart disease of native coronary artery with unstable angina pectoris: Secondary | ICD-10-CM

## 2017-01-02 DIAGNOSIS — E785 Hyperlipidemia, unspecified: Secondary | ICD-10-CM

## 2017-01-02 DIAGNOSIS — I10 Essential (primary) hypertension: Secondary | ICD-10-CM

## 2017-01-02 DIAGNOSIS — Z794 Long term (current) use of insulin: Secondary | ICD-10-CM

## 2017-01-02 LAB — POCT URINALYSIS DIPSTICK
Bilirubin, UA: NEGATIVE
Blood, UA: NEGATIVE
KETONES UA: NEGATIVE
LEUKOCYTES UA: NEGATIVE
NITRITE UA: NEGATIVE
Spec Grav, UA: 1.02
Urobilinogen, UA: NEGATIVE
pH, UA: 6

## 2017-01-02 LAB — COMPREHENSIVE METABOLIC PANEL
ALT: 21 U/L (ref 0–53)
AST: 15 U/L (ref 0–37)
Albumin: 3.8 g/dL (ref 3.5–5.2)
Alkaline Phosphatase: 127 U/L — ABNORMAL HIGH (ref 39–117)
BUN: 15 mg/dL (ref 6–23)
CHLORIDE: 103 meq/L (ref 96–112)
CO2: 31 meq/L (ref 19–32)
Calcium: 9.4 mg/dL (ref 8.4–10.5)
Creatinine, Ser: 1.06 mg/dL (ref 0.40–1.50)
GFR: 87.68 mL/min (ref >60.00)
GLUCOSE: 222 mg/dL — AB (ref 70–99)
Potassium: 3.6 mEq/L (ref 3.5–5.1)
Sodium: 142 mEq/L (ref 135–145)
Total Bilirubin: 0.3 mg/dL (ref 0.2–1.2)
Total Protein: 7 g/dL (ref 6.0–8.3)

## 2017-01-02 LAB — LIPID PANEL
CHOL/HDL RATIO: 4
Cholesterol: 143 mg/dL (ref 0–200)
HDL: 37.7 mg/dL — AB (ref >39.00)
LDL Cholesterol: 88 mg/dL (ref 0–99)
NONHDL: 105.3
Triglycerides: 87 mg/dL (ref 0.0–149.0)
VLDL: 17.4 mg/dL (ref 0.0–40.0)

## 2017-01-02 LAB — MICROALBUMIN / CREATININE URINE RATIO
Creatinine,U: 209.5 mg/dL
Microalb Creat Ratio: 29.6 mg/g (ref 0.0–30.0)
Microalb, Ur: 62.1 mg/dL — ABNORMAL HIGH (ref 0.0–1.9)

## 2017-01-02 MED ORDER — AMLODIPINE BESYLATE 10 MG PO TABS: 10.0000 mg | ORAL_TABLET | Freq: Every day | ORAL | 11 refills | Status: DC

## 2017-01-02 MED ORDER — ATORVASTATIN CALCIUM 80 MG PO TABS: 80.0000 mg | ORAL_TABLET | Freq: Every day | ORAL | Status: DC

## 2017-01-02 MED ORDER — FENOFIBRATE 160 MG PO TABS: 160.0000 mg | ORAL_TABLET | Freq: Every day | ORAL | 0 refills | Status: DC

## 2017-01-02 NOTE — Progress Notes (Signed)
Pre visit review using our clinic review tool, if applicable. No additional management support is needed unless otherwise documented below in the visit note. 

## 2017-01-02 NOTE — Progress Notes (Signed)
I acted as a Education administrator for Dr. Royden Purl, LPN     Subjective:    Patient ID: Edward [name], male    DOB: 1941-09-21, 76 y.o.   MRN: [id number]  Chief Complaint  Patient presents with  . Hyperlipidemia    follow up  . Hypertension    follow up  . Diabetes    follow up    Hyperlipidemia  This is a chronic problem. The current episode started more than 1 year ago. The problem is controlled. Pertinent negatives include no chest pain, myalgias or shortness of breath.  Hypertension  This is a chronic problem. The problem is controlled. Pertinent negatives include no blurred vision, chest pain, headaches, malaise/fatigue, palpitations or shortness of breath.  Diabetes  He presents for his follow-up diabetic visit. He has type 2 diabetes mellitus. His disease course has been stable. Pertinent negatives for hypoglycemia include no dizziness, headaches or nervousness/anxiousness. Pertinent negatives for diabetes include no blurred vision, no chest pain and no weakness. He is following a diabetic diet.   pt sees endo-- a1c last visit 7.7 Patient is in today for hyperlipidemia, diabetes, and hypertension.  Patient Care Team: Ann Held, DO as PCP - General (Family Medicine) Philemon Kingdom, MD as Consulting Physician (Internal Medicine) Alda Berthold, DO as Consulting Physician (Neurology) Juanito Doom, MD as Consulting Physician (Pulmonary Disease) Ladene Artist, MD as Consulting Physician (Gastroenterology) Hayden Pedro, MD as Consulting Physician (Ophthalmology)   Past Medical History:  Diagnosis Date  . Adenomatous colon polyp 04/2011  . CAD (coronary artery disease)    a. 01/2015 DES to LAD  b. 12/2015: [place] 85% oLCx lesion--> rx therapy.  . Chronic diastolic CHF (congestive heart failure) ([place])   . CKD (chronic kidney disease), stage II    a. probable CKD II-III with baseline CR 1.1-1.3.  . DM type 2 (diabetes mellitus, type 2), insulin dependent 01/29/2014   fasting cbg 50-120 with new regimen  . Dyslipidemia, goal LDL below 70 01/29/2014  . Enlarged prostate   . Essential tremor    a. on proprnolol  . GERD (gastroesophageal reflux disease)   . Hypertension   . Internal hemorrhoid   . Sleep apnea    does not use cpap (06/04/2015)  . TIA (transient ischemic attack) 2002    Past Surgical History:  Procedure Laterality Date  . BACK SURGERY    . CARDIAC CATHETERIZATION  01/28/14   + CAD treat medically  . CARDIAC CATHETERIZATION N/A 12/21/2015   Procedure: Right/Left Heart Cath and Coronary Angiography;  Surgeon: Belva Crome, MD;  Location: [facility];  Service: Cardiovascular;  Laterality: N/A;  . CARDIAC CATHETERIZATION N/A 11/02/2016   Procedure: Left Heart Cath and Coronary Angiography;  Surgeon: Nelva Bush, MD;  Location: [facility];  Service: Cardiovascular;  Laterality: N/A;  . CATARACT EXTRACTION Bilateral   . CATARACT EXTRACTION, BILATERAL Bilateral   . CORONARY ANGIOPLASTY WITH STENT PLACEMENT  01/30/2015   DES [place]  [place] to LAD by Dr Tamala Julian  . CORONARY ARTERY BYPASS GRAFT N/A 11/09/2016   Procedure: CORONARY ARTERY BYPASS GRAFTING (CABG) x 2;  Surgeon: Melrose Nakayama, MD;  Location: [facility];  Service: Open Heart Surgery;  Laterality: N/A;  . ENDOVEIN HARVEST OF GREATER SAPHENOUS VEIN Left 11/09/2016   Procedure: ENDOVEIN HARVEST OF GREATER SAPHENOUS VEIN;  Surgeon: Melrose Nakayama, MD;  Location: [facility];  Service: Open Heart Surgery;  Laterality: Left;  . JOINT REPLACEMENT    .  LEFT HEART CATHETERIZATION WITH CORONARY ANGIOGRAM N/A 01/28/2014   Procedure: LEFT HEART CATHETERIZATION WITH CORONARY ANGIOGRAM;  Surgeon: Sinclair Grooms, MD;  Location: [facility] CATH LAB;  Service: Cardiovascular;  Laterality: N/A;  . LEFT HEART CATHETERIZATION WITH CORONARY ANGIOGRAM N/A 01/30/2015   Procedure: LEFT HEART CATHETERIZATION WITH CORONARY ANGIOGRAM;  Surgeon: Belva Crome, MD;  Location: [facility] CATH LAB;  Service:  Cardiovascular;  Laterality: N/A;  . LUMBAR [place]    . SHOULDER ARTHROSCOPY Right 07/30/2014   Procedure: Right Shoulder Arthroscopy, Debridement, Decompression, Manipulation Under Anesthesia;  Surgeon: Newt Minion, MD;  Location: [facility];  Service: Orthopedics;  Laterality: Right;  . TEE WITHOUT CARDIOVERSION N/A 11/09/2016   Procedure: TRANSESOPHAGEAL ECHOCARDIOGRAM (TEE);  Surgeon: Melrose Nakayama, MD;  Location: [facility];  Service: Open Heart Surgery;  Laterality: N/A;  . TOTAL KNEE ARTHROPLASTY Bilateral     Family History  Problem Relation Age of Onset  . CAD Brother     2 brothers - CABG  . Alzheimer's disease Sister   . CAD Sister   . Prostate cancer Brother   . Asthma Brother     2 brothers   . Diabetes Other     entire family    Social History   Social History  . Marital status: Married    Spouse name: N/A  . Number of children: 0  . Years of education: N/A   Occupational History  . retired    Social History Main Topics  . Smoking status: Never Smoker  . Smokeless tobacco: Never Used  . Alcohol use No  . Drug use: No  . Sexual activity: No   Other Topics Concern  . 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   He lives alone.  His wife is living in [place] at this time.  They do not have children.   Highest level of education:  College graduate    Outpatient Medications Prior to Visit  Medication Sig Dispense Refill  . acetaminophen (TYLENOL) 500 MG tablet Take 2 tablets (1,000 mg total) by mouth every 6 (six) hours as needed. 30 tablet 0  . amLODipine (NORVASC) 10 MG tablet Take 1 tablet (10 mg total) by mouth daily. 30 tablet 11  . aspirin EC 81 MG tablet Take 81 mg by mouth daily.    Marland Kitchen atorvastatin (LIPITOR) 80 MG tablet Take 1 tablet (80 mg total) by mouth daily.    . clonazePAM (KLONOPIN) 0.5 MG tablet Take 1 tablet (0.5 mg total) by mouth 2 (two)  times daily as needed (tremors). 45 tablet 3  . clopidogrel (PLAVIX) 75 MG tablet Take 1 tablet (75 mg total) by mouth daily. 90 tablet 3  . diclofenac sodium (VOLTAREN) 1 % GEL Apply 2 g topically 4 (four) times daily as needed (for pain).     Marland Kitchen dicyclomine (BENTYL) 10 MG capsule Take 1 capsule (10 mg total) by mouth 3 (three) times daily before meals. 90 capsule 5  . fenofibrate 160 MG tablet Take 1 tablet (160 mg total) by mouth daily.  0  . fluticasone furoate-vilanterol (BREO ELLIPTA) 100-25 MCG/INH AEPB Inhale 1 puff into the lungs daily. 60 each 5  . furosemide (LASIX) 40 MG tablet Take 1 tablet (40 mg total) by mouth daily as needed for fluid. (Patient taking differently: Take 40 mg by mouth daily. )    .  furosemide (LASIX) 40 MG tablet take 1 tablet by mouth once daily 90 tablet 1  . gabapentin (NEURONTIN) 300 MG capsule Take 1 tablet at bedtime for one week then increase to 1 tablet twice daily (Patient taking differently: Take 300 mg by mouth 2 (two) times daily. ) 60 capsule 11  . glucose blood (ONE TOUCH ULTRA TEST) test strip Use as instructed to check sugar 4 times daily 400 each 5  . insulin aspart (NOVOLOG) 100 UNIT/ML injection Inject 15-20 units 3 times daily with meals. 20 mL 3  . insulin detemir (LEVEMIR) 100 UNIT/ML injection Inject 0.7 mLs (70 Units total) into the skin 2 (two) times daily. 10 mL 11  . Insulin Syringe-Needle U-100 (B-D INS SYRINGE 0.5CC/30GX1/2") 30G X 1/2" 0.5 ML MISC Use 4x a day 300 each 5  . meclizine (ANTIVERT) 25 MG tablet Take 1 tablet (25 mg total) by mouth 3 (three) times daily as needed for dizziness (vertigo). 30 tablet 0  . oxyCODONE (OXY IR/ROXICODONE) 5 MG immediate release tablet Take 5 mg by mouth every 4-6 hours PRN severe pain. 30 tablet 0  . pantoprazole (PROTONIX) 40 MG tablet Take 40 mg by mouth daily.  1  . Polyvinyl Alcohol-Povidone (REFRESH OP) Place 1 drop into both eyes daily as needed (for dry eyes). Reported on 12/25/2015    .  potassium chloride SA (K-DUR,KLOR-CON) 20 MEQ tablet Take 1 tablet (20 mEq total) by mouth daily as needed. Only take if takes Lasix 40 mg daily. (Patient taking differently: Take 20 mEq by mouth every other day. Only take if takes Lasix 40 mg daily.) 30 tablet 11  . propranolol (INDERAL) 40 MG tablet Take 40 mg by mouth 3 (three) times daily.  0  . VENTOLIN HFA 108 (90 BASE) MCG/ACT inhaler Inhale 2 puffs into the lungs every 6 (six) hours as needed for wheezing or shortness of breath.   0   No facility-administered medications prior to visit.     Allergies  Allergen Reactions  . Pneumococcal Vaccines Anaphylaxis  . No Known Allergies     Review of Systems  Constitutional: Negative for chills, fever and malaise/fatigue.  HENT: Negative for congestion and hearing loss.   Eyes: Negative for blurred vision and discharge.  Respiratory: Negative for cough, sputum production and shortness of breath.   Cardiovascular: Negative for chest pain, palpitations and leg swelling.  Gastrointestinal: Negative for abdominal pain, blood in stool, constipation, diarrhea, heartburn, nausea and vomiting.  Genitourinary: Negative for dysuria, frequency, hematuria and urgency.  Musculoskeletal: Negative for back pain, falls and myalgias.  Skin: Negative for rash.  Neurological: Negative for dizziness, sensory change, loss of consciousness, weakness and headaches.  Endo/Heme/Allergies: Negative for environmental allergies. Does not bruise/bleed easily.  Psychiatric/Behavioral: Negative for depression and suicidal ideas. The patient is not nervous/anxious and does not have insomnia.        Objective:    Physical Exam  Constitutional: He is oriented to person, place, and time. He appears well-developed and well-nourished. No distress.  HENT:  Head: Normocephalic and atraumatic.  Eyes: Conjunctivae are normal.  Neck: Normal range of motion. No thyromegaly present.  Cardiovascular: Normal rate and regular  rhythm.   Pulmonary/Chest: [place] normal and breath sounds normal. He has no wheezes.  Abdominal: Soft. Bowel sounds are normal. There is no tenderness.  Musculoskeletal: Normal range of motion. He exhibits no edema or deformity.  Neurological: He is alert and oriented to person, place, and time.  Skin: Skin is warm and  dry. He is not diaphoretic.  Psychiatric: He has a normal mood and affect. His behavior is normal. Judgment and thought content normal.  Nursing note and vitals reviewed. Sensory exam of the foot is normal, tested with the monofilament. Good pulses, no lesions or ulcers, good peripheral pulses.  BP 118/62 (BP Location: Left Arm, Patient Position: Sitting, Cuff Size: Large)   Pulse 73   Temp 97.9 F (36.6 C) (Oral)   Resp 16   Ht 5\' 8"  (1.727 m)   Wt 261 lb (118.4 kg)   SpO2 97%   BMI 39.68 kg/m  Wt Readings from Last 3 Encounters:  01/02/17 261 lb (118.4 kg)  12/23/16 264 lb 12.8 oz (120.1 kg)  12/16/16 266 lb (120.7 kg)     Lab Results  Component Value Date   WBC 6.7 12/07/2016   HGB 10.4 (L) 12/07/2016   HCT 32.7 (L) 12/07/2016   PLT 292 12/07/2016   GLUCOSE 222 (H) 01/02/2017   CHOL 143 01/02/2017   TRIG 87.0 01/02/2017   HDL 37.70 (L) 01/02/2017   LDLDIRECT 144.0 07/08/2015   LDLCALC 88 01/02/2017   ALT 21 01/02/2017   AST 15 01/02/2017   NA 142 01/02/2017   K 3.6 01/02/2017   CL 103 01/02/2017   CREATININE 1.06 01/02/2017   BUN 15 01/02/2017   CO2 31 01/02/2017   TSH 2.344 10/29/2016   PSA 0.74 02/11/2014   INR 1.35 11/09/2016   HGBA1C 7.7 12/16/2016   MICROALBUR 62.1 (H) 01/02/2017    Lab Results  Component Value Date   TSH 2.344 10/29/2016   Lab Results  Component Value Date   WBC 6.7 12/07/2016   HGB 10.4 (L) 12/07/2016   HCT 32.7 (L) 12/07/2016   MCV 83.8 12/07/2016   PLT 292 12/07/2016   Lab Results  Component Value Date   NA 142 01/02/2017   K 3.6 01/02/2017   CO2 31 01/02/2017   GLUCOSE 222 (H) 01/02/2017   BUN 15  01/02/2017   CREATININE 1.06 01/02/2017   BILITOT 0.3 01/02/2017   ALKPHOS 127 (H) 01/02/2017   AST 15 01/02/2017   ALT 21 01/02/2017   PROT 7.0 01/02/2017   ALBUMIN 3.8 01/02/2017   CALCIUM 9.4 01/02/2017   ANIONGAP 9 12/07/2016   GFR 87.68 01/02/2017   Lab Results  Component Value Date   CHOL 143 01/02/2017   Lab Results  Component Value Date   HDL 37.70 (L) 01/02/2017   Lab Results  Component Value Date   LDLCALC 88 01/02/2017   Lab Results  Component Value Date   TRIG 87.0 01/02/2017   Lab Results  Component Value Date   CHOLHDL 4 01/02/2017   Lab Results  Component Value Date   HGBA1C 7.7 12/16/2016       Assessment & Plan:   Problem List Items Addressed This Visit      Unprioritized   Essential hypertension (Chronic)   Relevant Orders   Comprehensive metabolic panel (Completed)   Lipid panel (Completed)   Microalbumin / creatinine urine ratio (Completed)   POCT urinalysis dipstick (Completed)   Hyperlipidemia LDL goal <70 (Chronic)    Check labs      Relevant Orders   Comprehensive metabolic panel (Completed)   Lipid panel (Completed)   Microalbumin / creatinine urine ratio (Completed)   POCT urinalysis dipstick (Completed)   Type 2 diabetes mellitus with circulatory disorder, with long-term current use of insulin (HCC) - Primary    con't insulin F/u endo  Lab  Results  Component Value Date   HGBA1C 7.7 12/16/2016         Other Visit Diagnoses    Hyperlipidemia, unspecified hyperlipidemia type          I am having Mr. Ouk maintain his aspirin EC, Polyvinyl Alcohol-Povidone (REFRESH OP), VENTOLIN HFA, diclofenac sodium, fluticasone furoate-vilanterol, dicyclomine, pantoprazole, meclizine, fenofibrate, amLODipine, gabapentin, clonazePAM, atorvastatin, oxyCODONE, potassium chloride SA, furosemide, acetaminophen, insulin detemir, Insulin Syringe-Needle U-100, furosemide, propranolol, clopidogrel, glucose blood, and insulin aspart.  No  orders of the defined types were placed in this encounter.   CMA served as Education administrator during this visit. History, Physical and Plan performed by medical provider. Documentation and orders reviewed and attested to.  Ann Held, DOPatient ID: Edward [name], male   DOB: Dec 21, 1940, 76 y.o.   MRN: [id number]

## 2017-01-02 NOTE — Patient Instructions (Signed)
Diabetes Mellitus and Food It is important for you to manage your blood sugar (glucose) level. Your blood glucose level can be greatly affected by what you eat. Eating healthier foods in the appropriate amounts throughout the day at about the same time each day will help you control your blood glucose level. It can also help slow or prevent worsening of your diabetes mellitus. Healthy eating may even help you improve the level of your blood pressure and reach or maintain a healthy weight. General recommendations for healthful eating and cooking habits include:  Eating meals and snacks regularly. Avoid going long periods of time without eating to lose weight.  Eating a diet that consists mainly of plant-based foods, such as fruits, vegetables, nuts, legumes, and whole grains.  Using low-heat cooking methods, such as baking, instead of high-heat cooking methods, such as deep frying.  Work with your dietitian to make sure you understand how to use the Nutrition Facts information on food labels. How can food affect me? Carbohydrates Carbohydrates affect your blood glucose level more than any other type of food. Your dietitian will help you determine how many carbohydrates to eat at each meal and teach you how to count carbohydrates. Counting carbohydrates is important to keep your blood glucose at a healthy level, especially if you are using insulin or taking certain medicines for diabetes mellitus. Alcohol Alcohol can cause sudden decreases in blood glucose (hypoglycemia), especially if you use insulin or take certain medicines for diabetes mellitus. Hypoglycemia can be a life-threatening condition. Symptoms of hypoglycemia (sleepiness, dizziness, and disorientation) are similar to symptoms of having too much alcohol. If your health care provider has given you approval to drink alcohol, do so in moderation and use the following guidelines:  Women should not have more than one drink per day, and men  should not have more than two drinks per day. One drink is equal to: ? 12 oz of beer. ? 5 oz of wine. ? 1 oz of hard liquor.  Do not drink on an empty stomach.  Keep yourself hydrated. Have water, diet soda, or unsweetened iced tea.  Regular soda, juice, and other mixers might contain a lot of carbohydrates and should be counted.  What foods are not recommended? As you make food choices, it is important to remember that all foods are not the same. Some foods have fewer nutrients per serving than other foods, even though they might have the same number of calories or carbohydrates. It is difficult to get your body what it needs when you eat foods with fewer nutrients. Examples of foods that you should avoid that are high in calories and carbohydrates but low in nutrients include:  Trans fats (most processed foods list trans fats on the Nutrition Facts label).  Regular soda.  Juice.  Candy.  Sweets, such as cake, pie, doughnuts, and cookies.  Fried foods.  What foods can I eat? Eat nutrient-rich foods, which will nourish your body and keep you healthy. The food you should eat also will depend on several factors, including:  The calories you need.  The medicines you take.  Your weight.  Your blood glucose level.  Your blood pressure level.  Your cholesterol level.  You should eat a variety of foods, including:  Protein. ? Lean cuts of meat. ? Proteins low in saturated fats, such as fish, egg whites, and beans. Avoid processed meats.  Fruits and vegetables. ? Fruits and vegetables that may help control blood glucose levels, such as apples,   mangoes, and yams.  Dairy products. ? Choose fat-free or low-fat dairy products, such as milk, yogurt, and cheese.  Grains, bread, pasta, and rice. ? Choose whole grain products, such as multigrain bread, whole oats, and brown rice. These foods may help control blood pressure.  Fats. ? Foods containing healthful fats, such as  nuts, avocado, olive oil, canola oil, and fish.  Does everyone with diabetes mellitus have the same meal plan? Because every person with diabetes mellitus is different, there is not one meal plan that works for everyone. It is very important that you meet with a dietitian who will help you create a meal plan that is just right for you. This information is not intended to replace advice given to you by your health care provider. Make sure you discuss any questions you have with your health care provider. Document Released: 07/14/2005 Document Revised: 03/24/2016 Document Reviewed: 09/13/2013 Elsevier Interactive Patient Education  2017 [employer] Inc.  

## 2017-01-02 NOTE — Assessment & Plan Note (Signed)
Check labs 

## 2017-01-02 NOTE — Assessment & Plan Note (Signed)
con't insulin F/u endo  Lab Results  Component Value Date   HGBA1C 7.7 12/16/2016

## 2017-01-09 ENCOUNTER — Other Ambulatory Visit: Payer: Self-pay | Admitting: Interventional Cardiology

## 2017-01-09 ENCOUNTER — Telehealth: Payer: Self-pay | Admitting: *Deleted

## 2017-01-09 DIAGNOSIS — E876 Hypokalemia: Secondary | ICD-10-CM

## 2017-01-09 MED ORDER — TRAMADOL HCL 50 MG PO TABS: 50.0000 mg | ORAL_TABLET | Freq: Three times a day (TID) | ORAL | 0 refills | Status: DC | PRN

## 2017-01-09 NOTE — Telephone Encounter (Signed)
Patient want to know if he can get a pain medication for his back pain.  He states that his back is killing him.  He got hydrocodone  Back in September but he would like to have something that he does not have to come pick up.

## 2017-01-09 NOTE — Telephone Encounter (Signed)
Patient notified and rx called in.

## 2017-01-09 NOTE — Telephone Encounter (Signed)
Ultram 50 mg #30  1 po q8h prn

## 2017-01-12 ENCOUNTER — Telehealth: Payer: Self-pay | Admitting: Interventional Cardiology

## 2017-01-12 NOTE — Telephone Encounter (Signed)
New Message     Pt wants to go back to [place] , the Plavix is causing him to have a lot of pain in legs and back , please call

## 2017-01-12 NOTE — Telephone Encounter (Signed)
Spoke with pt and he states that about a week ago he started having muscle aches in his legs and back.  Pt took Tylenol and it doesn't seem to help.  Pt concerned that it could be coming from switching to Plavix.  Advised I had not heard of Plavix causing muscle aches.  Asked pt about Atorvastatin and he states that he just started this about 3 weeks ago, prescribed by PCP.  Advised pt this is likely causing the muscle aches because it is a common side effect of statin meds.  Pt would like for Dr. Tamala Julian to be made aware and see if he has ever heard of Plavix causing this.  Advised pt I will send message to Dr. Tamala Julian.

## 2017-01-13 NOTE — Telephone Encounter (Signed)
Informed pt of recommendations per Dr. Tamala Julian.  Pt verbalized understanding and was in agreement with this plan.  Pt appreciative for call.

## 2017-01-13 NOTE — Telephone Encounter (Signed)
I agree with your suggestion. Plavix should not be stopped. Stop atorvastatin for 2-4 weeks and see if the discomfort improves. Return phone call with follow-up is requested from the patient.

## 2017-01-25 ENCOUNTER — Encounter: Payer: Self-pay | Admitting: Internal Medicine

## 2017-01-25 ENCOUNTER — Ambulatory Visit (INDEPENDENT_AMBULATORY_CARE_PROVIDER_SITE_OTHER): Payer: Medicare Other | Admitting: Internal Medicine

## 2017-01-25 VITALS — BP 140/78 | HR 86 | Wt 275.0 lb

## 2017-01-25 DIAGNOSIS — E1159 Type 2 diabetes mellitus with other circulatory complications: Secondary | ICD-10-CM | POA: Diagnosis not present

## 2017-01-25 DIAGNOSIS — Z794 Long term (current) use of insulin: Secondary | ICD-10-CM | POA: Diagnosis not present

## 2017-01-25 DIAGNOSIS — I25110 Atherosclerotic heart disease of native coronary artery with unstable angina pectoris: Secondary | ICD-10-CM | POA: Diagnosis not present

## 2017-01-25 LAB — GLUCOSE, POCT (MANUAL RESULT ENTRY): POC Glucose: 173 mg/dl — AB (ref 70–99)

## 2017-01-25 MED ORDER — INSULIN ASPART 100 UNIT/ML ~~LOC~~ SOLN: SUBCUTANEOUS | 11 refills | Status: DC

## 2017-01-25 MED ORDER — INSULIN DETEMIR 100 UNIT/ML ~~LOC~~ SOLN: 45.0000 [IU] | Freq: Two times a day (BID) | SUBCUTANEOUS | 11 refills | Status: DC

## 2017-01-25 NOTE — Patient Instructions (Addendum)
Please increase: - Levemir to 45 units 2x a day  Continue: - Novolog 15 min before meals: - 30 units before a smaller meal - 40 units before a larger meal  Please return in 1.5 months with your sugar log.

## 2017-01-25 NOTE — Addendum Note (Signed)
Addended by: Caprice Beaver T on: 01/25/2017 09:20 AM   Modules accepted: Orders

## 2017-01-25 NOTE — Progress Notes (Signed)
Patient ID: Edward [name], male   DOB: Oct 04, 1941, 76 y.o.   MRN: [id number]  HPI: Edward [name] is a 76 y.o.-year-old male, returning for f/u for DM2,  dx 1987, insulin-dependent since 1989, uncontrolled, with complications (CAD - s/p 2 AMIs, PN). He moved from [place] in 12/2013. Last visit 1.5 mo ago.  He was admitted with CP - had CABG x 2 11/09/2016. He was discharged from the hospital on only Levemir. We added back rapid acting insulin before meals at last visit.  He is still not feeling very energetic now but he is looking forward to start cardiac rehab 2x a week for 2h at a time, in am.  Reviewed hemoglobin A1c levels: 09/13/2016: HbA1c calculated from fructosamine is 6.86% Lab Results  Component Value Date   HGBA1C 7.7 12/16/2016   HGBA1C 9.4 09/13/2016   HGBA1C 9.4 06/13/2016    He was on:  Insulin  Before breakfast  Before lunch  Before dinner   Novolin 70/30 (vial) 70 (instead of 40) 70 70 (instead of 60)  Novolog He was on Metformin >> N/V/D.  Now on: - Levemir 70 units 1-2 x a day (!!!) - takes it in am if sugars high in am. - Novolog 15 min before meals: - 30 units before a smaller meal - 40 units before a larger meal  Pt checks his sugars 3-4 a day (forgot log): - am: 100-140 >> 68, 110-115 >> 170s >> 132, 140-160 >> 110, 122-160, 182 >> 115-180 - 2h after b'fast: 128-263, 380 >> 170-180 >> 120-125 >> n/c>> 52 (took Levemir 2x a day) - before lunch:170s >> 130-200 >> 170-180 >> 130-140 >> 140-180 >> 130s >> 210-250 - 2h after lunch:  198-309 >> 121-254 >> 170-180s >> n/c >> n/c - before dinner: 150-200 >> 150-160, 200 >> 180-190 >> 100-110 >> 170s >> 142-180 - 2h after dinner: n/c >> 301-379>> 99, 128, 368 >> n/c >> 280 >> n/c - bedtime: 190-200 >> 93-198 >> n/c >> 160s >> n/c >> 140-165 >> n/c >> n/c - nighttime: n/c >> 300s >> 139-339 >> n/c >> 52 x2 >> 83 >> occas. 87 >> 80s >> 115 Lowest sugar was 60 >> 60 x1 >> 80s >> 52 x1; he has hypoglycemia awareness at  100. Highest sugar was 250 >> 210 >> 280.  Has a One Touch Ultra meter.   - + CKD, last BUN/creatinine:  Lab Results  Component Value Date   BUN 15 01/02/2017   CREATININE 1.06 01/02/2017  Pt is seen by nephrology b/c proteinuria (555 mg/24h). He was started on Avapro by Dr. Hollie Salk. - Has HL. Last set of lipids: Lab Results  Component Value Date   CHOL 143 01/02/2017   HDL 37.70 (L) 01/02/2017   LDLCALC 88 01/02/2017   LDLDIRECT 144.0 07/08/2015   TRIG 87.0 01/02/2017   CHOLHDL 4 01/02/2017  Not on a statin. He gets leg cramps from Lipitor >> could not tolerate it in the past. Now back on this after last hospitalization. - last eye exam was in 02/29/2016 (Dr Zigmund Daniel). Has DR. Had cataracts sx.  - + numbness and tingling in his feet. He saw podiatrist (05/2015) >> has PN. On ASA 81.  Pt was previously admitted with CP 12/18/2015 >> had another AMI. He was started on Ranexa and Effient, his dose of Imdur was increased.   I reviewed pt's medications, allergies, PMH, social hx, family hx, and changes were documented in the history of present illness.  Otherwise, unchanged from my initial visit note, except he started Effient.  ROS: Constitutional: + weight gain,  + fatigue, no hot flushes, + nocturia Eyes: no blurry vision, no xerophthalmia ENT: no sore throat, no nodules palpated in throat, no dysphagia/odynophagia, no hoarseness Cardiovascular: no CP/+ SOB/no palpitations/+ leg swelling Respiratory:+ occas. cough/+ SOB/no wheezing Gastrointestinal: no N/V/+ D/no heartburn Musculoskeletal: + muscle aches/joint aches  Skin: no rash Neurological: + tremors/no numbness/tingling/dizziness  PE: BP 140/78 (BP Location: Left Arm, Patient Position: Sitting)   Pulse 86   Wt 275 lb (124.7 kg)   SpO2 97%   BMI 41.81 kg/m  Body mass index is 41.81 kg/m. Wt Readings from Last 3 Encounters:  01/25/17 275 lb (124.7 kg)  01/02/17 261 lb (118.4 kg)  12/23/16 264 lb 12.8 oz (120.1 kg)    Constitutional: obese, in NAD Eyes: PERRLA, EOMI, no exophthalmos ENT: moist mucous membranes, no thyromegaly, no cervical lymphadenopathy Cardiovascular: RRR, No MRG, + B pitting leg swelling L>R Respiratory: CTA B Gastrointestinal: abdomen soft, NT, ND, BS+ Musculoskeletal: no deformities, strength intact in all 4 Skin: moist, warm, no rashes Neurological: + tremor with outstretched hands, DTR normal in all 4  ASSESSMENT: 1. DM2, insulin-dependent, uncontrolled, with complications - CAD H/o steroid inj's  Cardiologist: Dr Tamala Julian. Pulmonologist: Dr Lake Bells  PLAN:  1. Patient with long-standing, uncontrolled diabetes, previously on U500 insulin, then on Novolin 70/30, and now on Levemir + added Novolog at last visit. Sugars are higher than before as his brother was cooking for him (he is a Biomedical scientist) went back home to [place] as his daughter is sick. He also gained at least 10 pounds since last visit.  - Sugars are lower in a.m. and increase before lunch and they improved towards the end of the day. He is not taking the Levemir 1-2 times daily, if his sugars are high in the morning. Will continue with twice daily, however, I would like to decrease the dose, since he had a low blood sugar in the 50s after adding 70 units of Levemir in the morning. His sugars before lunch are higher, however, I would not change his NovoLog for now since he plans to start cardiac rehabilitation in a.m. - Reviewed last HbA1c, which was better, at 7.7% - I suggested to:  Patient Instructions  Please increase: - Levemir to 45 units 2x a day  Continue: - Novolog 15 min before meals: - 30 units before a smaller meal - 40 units before a larger meal  Please return in 1.5 months with your sugar log.   - continue checking sugars at different times of the day - check 3-4 times a day, rotating checks - up to date with yearly eye exams  - Return to clinic in 1.5 mo with sugar log   Philemon Kingdom, MD  PhD [facility] Endocrinology

## 2017-01-27 ENCOUNTER — Other Ambulatory Visit: Payer: Self-pay | Admitting: Interventional Cardiology

## 2017-01-27 DIAGNOSIS — I10 Essential (primary) hypertension: Secondary | ICD-10-CM

## 2017-01-27 DIAGNOSIS — M25512 Pain in left shoulder: Secondary | ICD-10-CM | POA: Diagnosis not present

## 2017-02-02 ENCOUNTER — Encounter (HOSPITAL_COMMUNITY)
Admission: RE | Admit: 2017-02-02 | Discharge: 2017-02-02 | Disposition: A | Discharge status: Home / Self Care | Payer: Medicare Other | Source: Ambulatory Visit | Attending: Interventional Cardiology | Admitting: Interventional Cardiology

## 2017-02-02 VITALS — BP 186/82 | Ht 67.25 in | Wt 281.1 lb

## 2017-02-02 DIAGNOSIS — J849 Interstitial pulmonary disease, unspecified: Secondary | ICD-10-CM | POA: Insufficient documentation

## 2017-02-02 DIAGNOSIS — I2510 Atherosclerotic heart disease of native coronary artery without angina pectoris: Secondary | ICD-10-CM | POA: Insufficient documentation

## 2017-02-02 DIAGNOSIS — G250 Essential tremor: Secondary | ICD-10-CM | POA: Insufficient documentation

## 2017-02-02 DIAGNOSIS — Z794 Long term (current) use of insulin: Secondary | ICD-10-CM | POA: Diagnosis not present

## 2017-02-02 DIAGNOSIS — G4730 Sleep apnea, unspecified: Secondary | ICD-10-CM | POA: Diagnosis not present

## 2017-02-02 DIAGNOSIS — Z951 Presence of aortocoronary bypass graft: Secondary | ICD-10-CM | POA: Diagnosis not present

## 2017-02-02 DIAGNOSIS — N182 Chronic kidney disease, stage 2 (mild): Secondary | ICD-10-CM | POA: Insufficient documentation

## 2017-02-02 DIAGNOSIS — K219 Gastro-esophageal reflux disease without esophagitis: Secondary | ICD-10-CM | POA: Diagnosis not present

## 2017-02-02 DIAGNOSIS — Z79899 Other long term (current) drug therapy: Secondary | ICD-10-CM | POA: Insufficient documentation

## 2017-02-02 DIAGNOSIS — Z8673 Personal history of transient ischemic attack (TIA), and cerebral infarction without residual deficits: Secondary | ICD-10-CM | POA: Diagnosis not present

## 2017-02-02 DIAGNOSIS — E1122 Type 2 diabetes mellitus with diabetic chronic kidney disease: Secondary | ICD-10-CM | POA: Diagnosis not present

## 2017-02-02 DIAGNOSIS — E785 Hyperlipidemia, unspecified: Secondary | ICD-10-CM | POA: Insufficient documentation

## 2017-02-02 DIAGNOSIS — I130 Hypertensive heart and chronic kidney disease with heart failure and stage 1 through stage 4 chronic kidney disease, or unspecified chronic kidney disease: Secondary | ICD-10-CM | POA: Diagnosis not present

## 2017-02-02 DIAGNOSIS — I5032 Chronic diastolic (congestive) heart failure: Secondary | ICD-10-CM | POA: Diagnosis not present

## 2017-02-02 DIAGNOSIS — Z7902 Long term (current) use of antithrombotics/antiplatelets: Secondary | ICD-10-CM | POA: Insufficient documentation

## 2017-02-02 DIAGNOSIS — Z7982 Long term (current) use of aspirin: Secondary | ICD-10-CM | POA: Diagnosis not present

## 2017-02-02 LAB — GLUCOSE, CAPILLARY: GLUCOSE-CAPILLARY: 155 mg/dL — AB (ref 65–99)
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
## 2017-02-02 NOTE — Progress Notes (Signed)
Cardiac Rehab Medication Review by a Pharmacist  Does the patient  feel that his/her medications are working for him/her?  yes  Has the patient been experiencing any side effects to the medications prescribed?  Yes - cramping  Does the patient measure his/her own blood pressure or blood glucose at home?  yes   Does the patient have any problems obtaining medications due to transportation or finances?   no  Understanding of regimen: fair Understanding of indications: fiar Potential of compliance: good   Pharmacist comments: Pt presents for initial cardiac rehab appointment. Pt endorses recent muscle pain and cramping but states that he has been taken off of his statin and is trying to eat more potassium. Pt feels blood sugar may be low this morning - RN to check in clinic. No other issues noted.  Arrie Senate, PharmD PGY-1 Pharmacy Resident Pager: [phone] 02/02/2017

## 2017-02-05 ENCOUNTER — Encounter (HOSPITAL_COMMUNITY): Payer: Self-pay | Admitting: Emergency Medicine

## 2017-02-05 ENCOUNTER — Emergency Department (HOSPITAL_COMMUNITY): Payer: Medicare Other

## 2017-02-05 ENCOUNTER — Observation Stay (HOSPITAL_COMMUNITY)
Admission: EM | Admit: 2017-02-05 | Discharge: 2017-02-06 | Disposition: A | Discharge status: Home / Self Care | Payer: Medicare Other | Attending: Internal Medicine | Admitting: Internal Medicine

## 2017-02-05 DIAGNOSIS — E1159 Type 2 diabetes mellitus with other circulatory complications: Secondary | ICD-10-CM | POA: Diagnosis not present

## 2017-02-05 DIAGNOSIS — K8020 Calculus of gallbladder without cholecystitis without obstruction: Secondary | ICD-10-CM | POA: Diagnosis not present

## 2017-02-05 DIAGNOSIS — M25519 Pain in unspecified shoulder: Secondary | ICD-10-CM | POA: Diagnosis present

## 2017-02-05 DIAGNOSIS — K219 Gastro-esophageal reflux disease without esophagitis: Secondary | ICD-10-CM | POA: Insufficient documentation

## 2017-02-05 DIAGNOSIS — M6281 Muscle weakness (generalized): Secondary | ICD-10-CM | POA: Insufficient documentation

## 2017-02-05 DIAGNOSIS — M12812 Other specific arthropathies, not elsewhere classified, left shoulder: Secondary | ICD-10-CM

## 2017-02-05 DIAGNOSIS — Z951 Presence of aortocoronary bypass graft: Secondary | ICD-10-CM | POA: Diagnosis not present

## 2017-02-05 DIAGNOSIS — M19012 Primary osteoarthritis, left shoulder: Secondary | ICD-10-CM | POA: Diagnosis not present

## 2017-02-05 DIAGNOSIS — I130 Hypertensive heart and chronic kidney disease with heart failure and stage 1 through stage 4 chronic kidney disease, or unspecified chronic kidney disease: Secondary | ICD-10-CM | POA: Diagnosis not present

## 2017-02-05 DIAGNOSIS — E119 Type 2 diabetes mellitus without complications: Secondary | ICD-10-CM

## 2017-02-05 DIAGNOSIS — N183 Chronic kidney disease, stage 3 unspecified: Secondary | ICD-10-CM | POA: Diagnosis present

## 2017-02-05 DIAGNOSIS — Z794 Long term (current) use of insulin: Secondary | ICD-10-CM | POA: Diagnosis not present

## 2017-02-05 DIAGNOSIS — G250 Essential tremor: Secondary | ICD-10-CM | POA: Diagnosis not present

## 2017-02-05 DIAGNOSIS — D649 Anemia, unspecified: Secondary | ICD-10-CM | POA: Diagnosis not present

## 2017-02-05 DIAGNOSIS — Z8249 Family history of ischemic heart disease and other diseases of the circulatory system: Secondary | ICD-10-CM | POA: Insufficient documentation

## 2017-02-05 DIAGNOSIS — I5032 Chronic diastolic (congestive) heart failure: Secondary | ICD-10-CM

## 2017-02-05 DIAGNOSIS — R52 Pain, unspecified: Secondary | ICD-10-CM

## 2017-02-05 DIAGNOSIS — Z7902 Long term (current) use of antithrombotics/antiplatelets: Secondary | ICD-10-CM | POA: Insufficient documentation

## 2017-02-05 DIAGNOSIS — R0789 Other chest pain: Secondary | ICD-10-CM | POA: Diagnosis not present

## 2017-02-05 DIAGNOSIS — E876 Hypokalemia: Secondary | ICD-10-CM | POA: Diagnosis not present

## 2017-02-05 DIAGNOSIS — Z8673 Personal history of transient ischemic attack (TIA), and cerebral infarction without residual deficits: Secondary | ICD-10-CM | POA: Insufficient documentation

## 2017-02-05 DIAGNOSIS — R079 Chest pain, unspecified: Secondary | ICD-10-CM | POA: Diagnosis not present

## 2017-02-05 DIAGNOSIS — M12819 Other specific arthropathies, not elsewhere classified, unspecified shoulder: Secondary | ICD-10-CM | POA: Diagnosis present

## 2017-02-05 DIAGNOSIS — I2510 Atherosclerotic heart disease of native coronary artery without angina pectoris: Secondary | ICD-10-CM | POA: Diagnosis not present

## 2017-02-05 DIAGNOSIS — E1122 Type 2 diabetes mellitus with diabetic chronic kidney disease: Secondary | ICD-10-CM | POA: Insufficient documentation

## 2017-02-05 DIAGNOSIS — E785 Hyperlipidemia, unspecified: Secondary | ICD-10-CM | POA: Diagnosis not present

## 2017-02-05 DIAGNOSIS — Z79899 Other long term (current) drug therapy: Secondary | ICD-10-CM | POA: Diagnosis not present

## 2017-02-05 DIAGNOSIS — I10 Essential (primary) hypertension: Secondary | ICD-10-CM | POA: Diagnosis present

## 2017-02-05 DIAGNOSIS — N200 Calculus of kidney: Secondary | ICD-10-CM | POA: Diagnosis not present

## 2017-02-05 DIAGNOSIS — Z96653 Presence of artificial knee joint, bilateral: Secondary | ICD-10-CM | POA: Diagnosis not present

## 2017-02-05 DIAGNOSIS — R0602 Shortness of breath: Secondary | ICD-10-CM | POA: Diagnosis not present

## 2017-02-05 DIAGNOSIS — Z7982 Long term (current) use of aspirin: Secondary | ICD-10-CM | POA: Diagnosis not present

## 2017-02-05 DIAGNOSIS — Z887 Allergy status to serum and vaccine status: Secondary | ICD-10-CM | POA: Diagnosis not present

## 2017-02-05 LAB — BASIC METABOLIC PANEL
Anion gap: 7 (ref 5–15)
BUN: 7 mg/dL (ref 6–20)
CHLORIDE: 102 mmol/L (ref 101–111)
CO2: 32 mmol/L (ref 22–32)
CREATININE: 0.94 mg/dL (ref 0.61–1.24)
Calcium: 9 mg/dL (ref 8.9–10.3)
GFR calc Af Amer: >60 mL/min (ref >60)
GFR calc non Af Amer: >60 mL/min (ref >60)
GLUCOSE: 83 mg/dL (ref 65–99)
POTASSIUM: 3.4 mmol/L — AB (ref 3.5–5.1)
Sodium: 141 mmol/L (ref 135–145)

## 2017-02-05 LAB — CBC
HEMATOCRIT: 36 % — AB (ref 39.0–52.0)
Hemoglobin: 11.7 g/dL — ABNORMAL LOW (ref 13.0–17.0)
MCH: 27.3 pg (ref 26.0–34.0)
MCHC: 32.5 g/dL (ref 30.0–36.0)
MCV: 84.1 fL (ref 78.0–100.0)
PLATELETS: 233 10*3/uL (ref 150–400)
RBC: 4.28 MIL/uL (ref 4.22–5.81)
RDW: 14.7 % (ref 11.5–15.5)
WBC: 6.6 10*3/uL (ref 4.0–10.5)

## 2017-02-05 LAB — CBG MONITORING, ED: Glucose-Capillary: 79 mg/dL (ref 65–99)

## 2017-02-05 LAB — TROPONIN I

## 2017-02-05 LAB — I-STAT TROPONIN, ED: Troponin i, poc: 0.01 ng/mL (ref 0.00–0.08)

## 2017-02-05 LAB — GLUCOSE, CAPILLARY
Glucose-Capillary: 226 mg/dL — ABNORMAL HIGH (ref 65–99)
Glucose-Capillary: 230 mg/dL — ABNORMAL HIGH (ref 65–99)

## 2017-02-05 MED ORDER — ALBUTEROL SULFATE (2.5 MG/3ML) 0.083% IN NEBU: 2.5000 mg | INHALATION_SOLUTION | Freq: Four times a day (QID) | RESPIRATORY_TRACT | Status: DC | PRN

## 2017-02-05 MED ORDER — POTASSIUM CHLORIDE CRYS ER 20 MEQ PO TBCR
20.0000 meq | EXTENDED_RELEASE_TABLET | Freq: Once | ORAL | Status: AC
  Administered 2017-02-05: 20 meq via ORAL
  Filled 2017-02-05: qty 1

## 2017-02-05 MED ORDER — ASPIRIN EC 81 MG PO TBEC
81.0000 mg | DELAYED_RELEASE_TABLET | Freq: Every day | ORAL | Status: DC
  Administered 2017-02-06: 81 mg via ORAL
  Filled 2017-02-05: qty 1

## 2017-02-05 MED ORDER — ONDANSETRON HCL 4 MG/2ML IJ SOLN
4.0000 mg | Freq: Once | INTRAMUSCULAR | Status: AC
  Administered 2017-02-05: 4 mg via INTRAVENOUS
  Filled 2017-02-05: qty 2

## 2017-02-05 MED ORDER — CLONAZEPAM 0.5 MG PO TABS: 0.5000 mg | ORAL_TABLET | Freq: Two times a day (BID) | ORAL | Status: DC | PRN

## 2017-02-05 MED ORDER — GABAPENTIN 300 MG PO CAPS
300.0000 mg | ORAL_CAPSULE | Freq: Two times a day (BID) | ORAL | Status: DC
  Administered 2017-02-05 – 2017-02-06 (×2): 300 mg via ORAL
  Filled 2017-02-05 (×2): qty 1

## 2017-02-05 MED ORDER — POTASSIUM CHLORIDE CRYS ER 20 MEQ PO TBCR: 20.0000 meq | EXTENDED_RELEASE_TABLET | Freq: Every day | ORAL | Status: DC

## 2017-02-05 MED ORDER — MORPHINE SULFATE (PF) 4 MG/ML IV SOLN
2.0000 mg | INTRAVENOUS | Status: DC | PRN
  Administered 2017-02-05 (×2): 2 mg via INTRAVENOUS
  Filled 2017-02-05 (×2): qty 1

## 2017-02-05 MED ORDER — FUROSEMIDE 40 MG PO TABS
40.0000 mg | ORAL_TABLET | Freq: Every day | ORAL | Status: DC
  Filled 2017-02-05: qty 1

## 2017-02-05 MED ORDER — ENOXAPARIN SODIUM 40 MG/0.4ML ~~LOC~~ SOLN
40.0000 mg | Freq: Every day | SUBCUTANEOUS | Status: DC
  Administered 2017-02-05: 40 mg via SUBCUTANEOUS
  Filled 2017-02-05: qty 0.4

## 2017-02-05 MED ORDER — ALBUTEROL SULFATE HFA 108 (90 BASE) MCG/ACT IN AERS: 2.0000 | INHALATION_SPRAY | Freq: Four times a day (QID) | RESPIRATORY_TRACT | Status: DC | PRN

## 2017-02-05 MED ORDER — ACETAMINOPHEN 500 MG PO TABS
1000.0000 mg | ORAL_TABLET | Freq: Four times a day (QID) | ORAL | Status: DC | PRN
  Administered 2017-02-06: 1000 mg via ORAL
  Filled 2017-02-05: qty 2

## 2017-02-05 MED ORDER — INSULIN DETEMIR 100 UNIT/ML ~~LOC~~ SOLN
70.0000 [IU] | Freq: Every day | SUBCUTANEOUS | Status: DC
  Administered 2017-02-05: 70 [IU] via SUBCUTANEOUS
  Filled 2017-02-05 (×2): qty 0.7

## 2017-02-05 MED ORDER — PROPRANOLOL HCL 40 MG PO TABS
40.0000 mg | ORAL_TABLET | Freq: Three times a day (TID) | ORAL | Status: DC
  Administered 2017-02-05 – 2017-02-06 (×2): 40 mg via ORAL
  Filled 2017-02-05 (×3): qty 1

## 2017-02-05 MED ORDER — ONDANSETRON HCL 4 MG/2ML IJ SOLN: 4.0000 mg | Freq: Four times a day (QID) | INTRAMUSCULAR | Status: DC | PRN

## 2017-02-05 MED ORDER — ATORVASTATIN CALCIUM 40 MG PO TABS: 40.0000 mg | ORAL_TABLET | Freq: Every day | ORAL | Status: DC

## 2017-02-05 MED ORDER — CLOPIDOGREL BISULFATE 75 MG PO TABS
75.0000 mg | ORAL_TABLET | Freq: Every day | ORAL | Status: DC
  Administered 2017-02-06: 75 mg via ORAL
  Filled 2017-02-05: qty 1

## 2017-02-05 MED ORDER — INSULIN ASPART 100 UNIT/ML ~~LOC~~ SOLN
40.0000 [IU] | Freq: Three times a day (TID) | SUBCUTANEOUS | Status: DC
  Administered 2017-02-06 (×2): 40 [IU] via SUBCUTANEOUS

## 2017-02-05 MED ORDER — INSULIN ASPART 100 UNIT/ML ~~LOC~~ SOLN
0.0000 [IU] | Freq: Three times a day (TID) | SUBCUTANEOUS | Status: DC
Start: 2017-02-06 — End: 2017-02-06
  Administered 2017-02-06: 3 [IU] via SUBCUTANEOUS
  Administered 2017-02-06: 5 [IU] via SUBCUTANEOUS

## 2017-02-05 MED ORDER — AMLODIPINE BESYLATE 10 MG PO TABS
10.0000 mg | ORAL_TABLET | Freq: Every day | ORAL | Status: DC
  Administered 2017-02-06: 10 mg via ORAL
  Filled 2017-02-05: qty 1

## 2017-02-05 MED ORDER — MORPHINE SULFATE (PF) 4 MG/ML IV SOLN
4.0000 mg | Freq: Once | INTRAVENOUS | Status: AC
  Administered 2017-02-05: 4 mg via INTRAVENOUS
  Filled 2017-02-05: qty 1

## 2017-02-05 MED ORDER — GI COCKTAIL ~~LOC~~
30.0000 mL | Freq: Four times a day (QID) | ORAL | Status: DC | PRN
  Administered 2017-02-06: 30 mL via ORAL
  Filled 2017-02-05: qty 30

## 2017-02-05 MED ORDER — IOPAMIDOL (ISOVUE-370) INJECTION 76%
INTRAVENOUS | Status: AC
  Administered 2017-02-05: 100 mL
  Filled 2017-02-05: qty 100

## 2017-02-05 NOTE — ED Provider Notes (Signed)
[place] DEPT Provider Note   CSN: [id number] Arrival date & time: 02/05/17  1118     History   Chief Complaint Chief Complaint  Patient presents with  . Chest Pain    HPI Edward [name] is a 76 y.o. male   has a past medical history of Adenomatous colon polyp (04/2011); CAD (coronary artery disease); Chronic diastolic CHF (congestive heart failure) ([place]); CKD (chronic kidney disease), stage II; DM type 2 (diabetes mellitus, type 2), insulin dependent (01/29/2014); Dyslipidemia, goal LDL below 70 (01/29/2014); Enlarged prostate; Essential tremor; GERD (gastroesophageal reflux disease); Hypertension; Internal hemorrhoid; Sleep apnea; and TIA (transient ischemic attack) (2002).  Present with CP that started at 7:00 AM felt wome mild left sided cp while showering. As he got dressed it ecame intense and was sharp, radiating into the left shoulder. He had sharp chest pain that lasted only a few seconds. He had associated R hand numbness.  He became diaporetic and sob, however sxs resolved with rest. He felt like his heart was racing at the time of the event. Patient drove himself to the fire station and was given ASA,. Patient was transported by EMS. He describes the pain as sharp. He had elevated BP at 664 systolic on home device.  Currently he is at baseline.    HPI  Past Medical History:  Diagnosis Date  . Adenomatous colon polyp 04/2011  . CAD (coronary artery disease)    a. 01/2015 DES to LAD  b. 12/2015: [place] 85% oLCx lesion--> rx therapy.  . Chronic diastolic CHF (congestive heart failure) ([place])   . CKD (chronic kidney disease), stage II    a. probable CKD II-III with baseline CR 1.1-1.3.  . DM type 2 (diabetes mellitus, type 2), insulin dependent 01/29/2014   fasting cbg 50-120 with new regimen  . Dyslipidemia, goal LDL below 70 01/29/2014  . Enlarged prostate   . Essential tremor    a. on proprnolol  . GERD (gastroesophageal reflux disease)   . Hypertension   . Internal  hemorrhoid   . Sleep apnea    does not use cpap (06/04/2015)  . TIA (transient ischemic attack) 2002    Patient Active Problem List   Diagnosis Date Noted  . 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 Hypertensive heart disease without heart failure   . Chronic kidney disease, stage III (moderate) 11/03/2016  . Chronic diastolic HF (heart failure) ([place])   . Flank pain, acute 07/06/2016  . Dizziness   . Hypokalemia 04/19/2016  . Chest pain 04/19/2016  . Near syncope 04/19/2016  . Lightheaded 04/19/2016  . Diabetes mellitus ([place]) 03/11/2016  . Atherosclerosis of native coronary artery of native heart with unstable angina pectoris ([place])   . Wheezing 11/30/2015  . GERD (gastroesophageal reflux disease) 08/27/2015  . Acute bronchitis 08/27/2015  . Dyspnea 07/08/2015  . OSA (obstructive sleep apnea) 06/22/2015  . Essential tremor 04/23/2015  . Rotator cuff arthropathy 07/30/2014  . CAP (community acquired pneumonia) 04/16/2014  . Severe obesity (BMI >= 40) ([place]) 02/11/2014  . Hyperlipidemia LDL goal <70 01/29/2014  . Essential hypertension 01/29/2014    Past Surgical History:  Procedure Laterality Date  . BACK SURGERY    . CARDIAC CATHETERIZATION  01/28/14   + CAD treat medically  . CARDIAC CATHETERIZATION N/A 12/21/2015   Procedure: Right/Left Heart Cath and Coronary Angiography;  Surgeon: Belva Crome, MD;  Location: [place] CV LAB;  Service: Cardiovascular;  Laterality: N/A;  . CARDIAC CATHETERIZATION N/A 11/02/2016  Procedure: Left Heart Cath and Coronary Angiography;  Surgeon: Nelva Bush, MD;  Location: [place] CV LAB;  Service: Cardiovascular;  Laterality: N/A;  . CATARACT EXTRACTION Bilateral   . CATARACT EXTRACTION, BILATERAL Bilateral   . CORONARY ANGIOPLASTY WITH STENT PLACEMENT  01/30/2015   DES Promus  [place] to LAD by Dr Tamala Julian  . CORONARY ARTERY BYPASS GRAFT N/A 11/09/2016   Procedure: CORONARY ARTERY BYPASS GRAFTING (CABG) x 2;  Surgeon: Melrose Nakayama, MD;   Location: [place];  Service: Open Heart Surgery;  Laterality: N/A;  . ENDOVEIN HARVEST OF GREATER SAPHENOUS VEIN Left 11/09/2016   Procedure: ENDOVEIN HARVEST OF GREATER SAPHENOUS VEIN;  Surgeon: Melrose Nakayama, MD;  Location: [place];  Service: Open Heart Surgery;  Laterality: Left;  . JOINT REPLACEMENT    . LEFT HEART CATHETERIZATION WITH CORONARY ANGIOGRAM N/A 01/28/2014   Procedure: LEFT HEART CATHETERIZATION WITH CORONARY ANGIOGRAM;  Surgeon: Sinclair Grooms, MD;  Location: [facility] CATH LAB;  Service: Cardiovascular;  Laterality: N/A;  . LEFT HEART CATHETERIZATION WITH CORONARY ANGIOGRAM N/A 01/30/2015   Procedure: LEFT HEART CATHETERIZATION WITH CORONARY ANGIOGRAM;  Surgeon: Belva Crome, MD;  Location: [facility] CATH LAB;  Service: Cardiovascular;  Laterality: N/A;  . LUMBAR [place]    . SHOULDER ARTHROSCOPY Right 07/30/2014   Procedure: Right Shoulder Arthroscopy, Debridement, Decompression, Manipulation Under Anesthesia;  Surgeon: Newt Minion, MD;  Location: [place];  Service: Orthopedics;  Laterality: Right;  . TEE WITHOUT CARDIOVERSION N/A 11/09/2016   Procedure: TRANSESOPHAGEAL ECHOCARDIOGRAM (TEE);  Surgeon: Melrose Nakayama, MD;  Location: [place];  Service: Open Heart Surgery;  Laterality: N/A;  . TOTAL KNEE ARTHROPLASTY Bilateral        Home Medications    Prior to Admission medications   Medication Sig Start Date End Date Taking? Authorizing Provider  acetaminophen (TYLENOL) 500 MG tablet Take 2 tablets (1,000 mg total) by mouth every 6 (six) hours as needed. 12/07/16   Leo Grosser, MD  amLODipine (NORVASC) 10 MG tablet take 1 tablet by mouth once daily 01/27/17   Belva Crome, MD  aspirin EC 81 MG tablet Take 81 mg by mouth daily.    Historical Provider, MD  clonazePAM (KLONOPIN) 0.5 MG tablet Take 1 tablet (0.5 mg total) by mouth 2 (two) times daily as needed (tremors). 09/28/16   Donika Keith Rake, DO  clopidogrel (PLAVIX) 75 MG tablet Take 1 tablet (75 mg total) by mouth daily.  12/23/16   Belva Crome, MD  diclofenac sodium (VOLTAREN) 1 % GEL Apply 2 g topically 4 (four) times daily as needed (for pain).     Historical Provider, MD  fenofibrate 160 MG tablet Take 1 tablet (160 mg total) by mouth daily. 01/02/17   Rosalita Chessman Chase, DO  furosemide (LASIX) 40 MG tablet take 1 tablet by mouth once daily 12/19/16   Belva Crome, MD  gabapentin (NEURONTIN) 300 MG capsule Take 1 tablet at bedtime for one week then increase to 1 tablet twice daily Patient taking differently: Take 300 mg by mouth 2 (two) times daily.  07/28/16   Donika K Patel, DO  glucose blood (ONE TOUCH ULTRA TEST) test strip Use as instructed to check sugar 4 times daily 12/30/16   Philemon Kingdom, MD  insulin aspart (NOVOLOG) 100 UNIT/ML injection Inject 30-40 units 3 times daily with meals. Patient taking differently: Inject 40 Units into the skin 3 (three) times daily with meals.  01/25/17   Philemon Kingdom, MD  insulin detemir (LEVEMIR) 100 UNIT/ML injection Inject 0.45 mLs (45 Units total) into the skin 2 (two) times daily. Patient taking differently: Inject 70 Units into the skin at bedtime.  01/25/17   Philemon Kingdom, MD  Insulin Syringe-Needle U-100 (B-D INS SYRINGE 0.5CC/30GX1/2") 30G X 1/2" 0.5 ML MISC Use 4x a day 12/16/16   Philemon Kingdom, MD  meclizine (ANTIVERT) 25 MG tablet Take 1 tablet (25 mg total) by mouth 3 (three) times daily as needed for dizziness (vertigo). 04/22/16   Theodis Blaze, MD  Polyvinyl Alcohol-Povidone (REFRESH OP) Place 1 drop into both eyes daily as needed (for dry eyes). Reported on 12/25/2015    Historical Provider, MD  potassium chloride SA (K-DUR,KLOR-CON) 20 MEQ tablet take 1 tablet by mouth once daily 01/10/17   Belva Crome, MD  propranolol (INDERAL) 40 MG tablet Take 40 mg by mouth 3 (three) times daily. 10/25/16   Historical Provider, MD  VENTOLIN HFA 108 (90 BASE) MCG/ACT inhaler Inhale 2 puffs into the lungs every 6 (six) hours as needed for wheezing or shortness  of breath.  06/22/15   Historical Provider, MD    Family History Family History  Problem Relation Age of Onset  . CAD Brother     2 brothers - CABG  . Alzheimer's disease Sister   . CAD Sister   . Prostate cancer Brother   . Asthma Brother     2 brothers   . Diabetes Other     entire family    Social History Social History  Substance Use Topics  . Smoking status: Never Smoker  . Smokeless tobacco: Never Used  . Alcohol use No     Allergies   Pneumococcal vaccines and No known allergies   Review of Systems Review of Systems  Ten systems reviewed and are negative for acute change, except as noted in the HPI.   Physical Exam Updated Vital Signs BP (!) 149/60   Pulse 73   Temp 98.2 F (36.8 C) (Oral)   Resp 18   SpO2 100%   Physical Exam  Constitutional: He appears well-developed and well-nourished. No distress.  HENT:  Head: Normocephalic and atraumatic.  Eyes: Conjunctivae are normal. No scleral icterus.  Neck: Normal range of motion. Neck supple.  Cardiovascular: Normal rate, regular rhythm and normal heart sounds.   Pulmonary/Chest: Effort normal and breath sounds normal. No respiratory distress. He exhibits tenderness.  Abdominal: Soft. There is no tenderness.  Musculoskeletal: He exhibits no edema.  Neurological: He is alert.  Skin: Skin is warm and dry. He is not diaphoretic.  Psychiatric: His behavior is normal.  Nursing note and vitals reviewed.    ED Treatments / Results  Labs (all labs ordered are listed, but only abnormal results are displayed) Labs Reviewed - No data to display  EKG  EKG Interpretation  Date/Time:  Sunday February 05 2017 11:27:54 EDT Ventricular Rate:  70 PR Interval:    QRS Duration: 95 QT Interval:  388 QTC Calculation: 419 R Axis:   81 Text Interpretation:  Sinus rhythm Prolonged PR interval Anteroseptal infarct, age indeterminate Baseline wander in lead(s) V2 Confirmed by RAY MD, DANIELLE [phone]) on 02/05/2017  11:35:43 AM       Radiology No results found.  Procedures Procedures (including critical care time)  Medications Ordered in ED Medications - No data to display   Initial Impression / Assessment and Plan / ED Course  I have reviewed the triage vital signs and the nursing notes.  Pertinent  labs & imaging results that were available during my care of the patient were reviewed by me and considered in my medical decision making (see chart for details).  Clinical Course as of Feb 06 629  Sun Feb 05, 2017  1347 Ekg shows some subtle changes of st elevation in the anterior leads and subtle depression in the inferior. He does not have any pain at this time except for left shoulder pain which is chronic. I have concern for possible aortic dissection as the patient's underlying cause given his constellation of sxs (sharp cp, UE neurologic sxs, diaphoresis)   [AH]  1507 CT Angio Chest Aorta W and/or Wo Contrast [AH]  6237 Spoke with Dr. Daryll Drown, hospitalist, who agreed to come see and admit the patient.   [SJ]    Clinical Course User Index [AH] Margarita Mail, PA-C [SJ] Lorayne Bender, PA-C   CT A pending. I have given sign out to [place] who will admit the patient after   Final Clinical Impressions(s) / ED Diagnoses   Final diagnoses:  None    New Prescriptions New Prescriptions   No medications on file     Margarita Mail, PA-C 02/06/17 [place], MD 02/06/17 2108

## 2017-02-05 NOTE — ED Notes (Signed)
Turkey sandwich and drink provided 

## 2017-02-05 NOTE — ED Triage Notes (Signed)
Pt arrives via [facility], reports he was on the way to church when he began having left sided chest pain, sob and diaphoresis. Pt a/ox4, resp e/u, nad. Received 81mg  asa pta.

## 2017-02-05 NOTE — ED Notes (Signed)
ED Provider at bedside. 

## 2017-02-05 NOTE — Progress Notes (Signed)
Received report from [place], [place] in the ED.

## 2017-02-05 NOTE — Progress Notes (Signed)
NURSING PROGRESS NOTE  Edward Doss PriceMRN: [id number] Admission Data: 02/05/2017 at 2030 Attending Provider: Sid Falcon, MD PCP: Ann Held, DO Code status: Full  Allergies:  Allergies  Allergen Reactions  . Pneumococcal Vaccines Anaphylaxis  . No Known Allergies    Past Medical History:  Past Medical History:  Diagnosis Date  . Adenomatous colon polyp 04/2011  . CAD (coronary artery disease)    a. 01/2015 DES to LAD  b. 12/2015: [place] 85% oLCx lesion--> rx therapy.  . Chronic diastolic CHF (congestive heart failure) ([place])   . CKD (chronic kidney disease), stage II    a. probable CKD II-III with baseline CR 1.1-1.3.  . DM type 2 (diabetes mellitus, type 2), insulin dependent 01/29/2014   fasting cbg 50-120 with new regimen  . Dyslipidemia, goal LDL below 70 01/29/2014  . Enlarged prostate   . Essential tremor    a. on proprnolol  . GERD (gastroesophageal reflux disease)   . Hypertension   . Internal hemorrhoid   . Sleep apnea    does not use cpap (06/04/2015)  . TIA (transient ischemic attack) 2002   Past Surgical History:  Past Surgical History:  Procedure Laterality Date  . BACK SURGERY    . CARDIAC CATHETERIZATION  01/28/14   + CAD treat medically  . CARDIAC CATHETERIZATION N/A 12/21/2015   Procedure: Right/Left Heart Cath and Coronary Angiography;  Surgeon: Belva Crome, MD;  Location: [place] CV LAB;  Service: Cardiovascular;  Laterality: N/A;  . CARDIAC CATHETERIZATION N/A 11/02/2016   Procedure: Left Heart Cath and Coronary Angiography;  Surgeon: Nelva Bush, MD;  Location: [place] CV LAB;  Service: Cardiovascular;  Laterality: N/A;  . CATARACT EXTRACTION Bilateral   . CATARACT EXTRACTION, BILATERAL Bilateral   . CORONARY ANGIOPLASTY WITH STENT PLACEMENT  01/30/2015   DES Promus  [place] to LAD by Dr Tamala Julian  . CORONARY ARTERY BYPASS GRAFT N/A 11/09/2016   Procedure: CORONARY ARTERY BYPASS GRAFTING (CABG) x 2;  Surgeon: Melrose Nakayama, MD;   Location: [place];  Service: Open Heart Surgery;  Laterality: N/A;  . ENDOVEIN HARVEST OF GREATER SAPHENOUS VEIN Left 11/09/2016   Procedure: ENDOVEIN HARVEST OF GREATER SAPHENOUS VEIN;  Surgeon: Melrose Nakayama, MD;  Location: [place];  Service: Open Heart Surgery;  Laterality: Left;  . JOINT REPLACEMENT    . LEFT HEART CATHETERIZATION WITH CORONARY ANGIOGRAM N/A 01/28/2014   Procedure: LEFT HEART CATHETERIZATION WITH CORONARY ANGIOGRAM;  Surgeon: Sinclair Grooms, MD;  Location[facility] C[facility] LAB;  Service: Cardiovascular;  Laterality: N/A;  . LEFT HEART CATHETERIZATION WITH CORONARY ANGIOGRAM N/A 01/30/2015   Procedure: LEFT HEART CATHETERIZATION WITH CORONARY ANGIOGRAM;  Surgeon: Belva Crome, MD;  Location: [facility] CATH LAB;  Service: Cardiovascular;  Laterality: N/A;  . LUMBAR [place]    . SHOULDER ARTHROSCOPY Right 07/30/2014   Procedure: Right Shoulder Arthroscopy, Debridement, Decompression, Manipulation Under Anesthesia;  Surgeon: Newt Minion, MD;  Location: [place];  Service: Orthopedics;  Laterality: Right;  . TEE WITHOUT CARDIOVERSION N/A 11/09/2016   Procedure: TRANSESOPHAGEAL ECHOCARDIOGRAM (TEE);  Surgeon: Melrose Nakayama, MD;  Location: [place];  Service: Open Heart Surgery;  Laterality: N/A;  . TOTAL KNEE ARTHROPLASTY Bilateral    Edward [name] is a 76 y.o. male patient, arrived to floor in room [phone] via stretcher, transferred from ED. Patient alert and oriented X 4. No acute distress noted. Complains of pain 4/10 left shoulder pain. Patient states he does not want any  pain medication at this time.   Vital signs: Oral temperature 98.2 F (36.8 C), Blood pressure 139/65, Pulse 72, RR 20, SpO2 99 % on room air. Height 5'8", weight 273 lbs (124.1 kg).   Cardiac monitoring: Telemetry box 5W #12 in place. Verified by Odessa Fleming., RN  IV access: Left AC; condition patent and no redness.  Skin: intact, no pressure ulcer noted in sacral area. Abrasions noted on right shoulder and left  ankle. Mid-chest and upper abdomen old scars from CABG in January 2018. Second verified by Shan Levans   Patient's ID armband verified with patient and in place. Information packet given to patient. Fall risk assessed, SR up X2, patient able to verbalize understanding of risks associated with falls and to call nurse or staff to assist before getting out of bed. Patient oriented to room and equipment. Call bell within reach.

## 2017-02-05 NOTE — ED Provider Notes (Signed)
Edward [name] is a 76 y.o. 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 Upon my interview, patient states he has had short, recurrences of his chest pain since this morning, but is currently pain-free. Intermittent shortness of breath.   Cardiologist is Dr. Tamala Julian.   HPI from [place], PA-C: "Edward [name] is a 75 y.o. 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  Present with CP that started at 7:00 AM felt wome mild left sided cp while showering. As he got dressed it ecame intense and was sharp, radiating into the left shoulder. He had sharp chest pain that lasted only a few seconds. He had associated R hand numbness.  He became diaporetic and sob, however sxs resolved with rest. He felt like his heart was racing at the time of the event. Patient drove himself to the fire station and was given ASA,. Patient was transported by EMS. He describes the pain as sharp. He had elevated BP at 440 systolic on home device.  Currently he is at baseline. "  Past Medical History:  Diagnosis Date  . Adenomatous colon polyp 04/2011  . CAD (coronary artery disease)    a. 01/2015 DES to LAD  b. 12/2015: [place] 85% oLCx lesion--> rx therapy.  . Chronic diastolic CHF (congestive heart failure) ([place])   . CKD (chronic kidney disease), stage II    a. probable CKD II-III with baseline CR 1.1-1.3.  . DM type 2 (diabetes mellitus, type 2), insulin dependent 01/29/2014   fasting cbg 50-120 with new  regimen  . Dyslipidemia, goal LDL below 70 01/29/2014  . Enlarged prostate   . Essential tremor    a. on proprnolol  . GERD (gastroesophageal reflux disease)   . Hypertension   . Internal hemorrhoid   . Sleep apnea    does not use cpap (06/04/2015)  . TIA (transient ischemic attack) 2002    Physical Exam  BP 129/74   Pulse 64   Temp 98.2 F (36.8 C) (Oral)   Resp 16   SpO2 94%   Physical Exam  Constitutional: He appears well-developed and well-nourished. No distress.  HENT:  Head: Normocephalic and atraumatic.  Eyes: Conjunctivae are normal.  Neck: Neck supple.  Cardiovascular: Normal rate, regular rhythm, normal heart sounds and intact distal pulses.   Pulmonary/Chest: Effort normal and breath sounds normal. No respiratory distress.  Abdominal: Soft. There is no tenderness. There is no guarding.  Musculoskeletal: He exhibits edema.  Bilateral lower extremity peripheral pitting edema. Lower extremities are nontender. Negative Homans sign.  Lymphadenopathy:    He has no cervical adenopathy.  Neurological: He is alert.  Skin: Skin is warm and dry. He is not diaphoretic.  Psychiatric: He has a normal mood and affect. His behavior is normal.  Nursing note and vitals reviewed.   ED Course  Procedures   Results for orders placed or performed during the hospital encounter of 34/74/25  Basic metabolic panel  Result Value Ref Range   Sodium 141 135 - 145 mmol/L   Potassium 3.4 (L) 3.5 - 5.1  mmol/L   Chloride 102 101 - 111 mmol/L   CO2 32 22 - 32 mmol/L   Glucose, Bld 83 65 - 99 mg/dL   BUN 7 6 - 20 mg/dL   Creatinine, Ser 0.94 0.61 - 1.24 mg/dL   Calcium 9.0 8.9 - 10.3 mg/dL   GFR calc non Af Amer >60 >60 mL/min   GFR calc Af Amer >60 >60 mL/min   Anion gap 7 5 - 15  CBC  Result Value Ref Range   WBC 6.6 4.0 - 10.5 K/uL   RBC 4.28 4.22 - 5.81 MIL/uL   Hemoglobin 11.7 (L) 13.0 - 17.0 g/dL   HCT 36.0 (L) 39.0 - 52.0 %   MCV 84.1 78.0 - 100.0 fL   MCH 27.3 26.0 - 34.0  pg   MCHC 32.5 30.0 - 36.0 g/dL   RDW 14.7 11.5 - 15.5 %   Platelets 233 150 - 400 K/uL  I-stat troponin, ED  Result Value Ref Range   Troponin i, poc 0.01 0.00 - 0.08 ng/mL   Comment 3          CBG monitoring, ED  Result Value Ref Range   Glucose-Capillary 79 65 - 99 mg/dL   Dg Chest 2 View  Result Date: 02/05/2017 CLINICAL DATA:  Acute onset chest pain, shortness breath, and diaphoresis today. Coronary artery disease. EXAM: CHEST  2 VIEW COMPARISON:  12/13/2016 FINDINGS: Low lung volumes are again demonstrated. Heart size remains at the upper limits of normal. Prior CABG again noted. Mild scarring seen in the left lung base. No evidence of pulmonary infiltrate or edema. No evidence of pleural effusion. IMPRESSION: Low lung volumes.  No acute cardiopulmonary disease. Electronically Signed   By: Earle Gell M.D.   On: 02/05/2017 15:00   Ct Angio Chest/abd/pel For Dissection W And/or W/wo  Result Date: 02/05/2017 CLINICAL DATA:  Chest pain.  CABG 11/09/2016. EXAM: CT ANGIOGRAPHY CHEST, ABDOMEN AND PELVIS TECHNIQUE: Multidetector CT imaging through the chest, abdomen and pelvis was performed using the standard protocol during bolus administration of intravenous contrast. Multiplanar reconstructed images and MIPs were obtained and reviewed to evaluate the vascular anatomy. CONTRAST:  100 cc Isovue 370 IV. COMPARISON:  Chest radiograph from earlier today. 08/29/2016 high-resolution chest CT. FINDINGS: CTA CHEST FINDINGS Cardiovascular: Stable top-normal heart size. No significant pericardial fluid/thickening. Left main, left anterior descending, left circumflex and right coronary atherosclerosis status post CABG. Atherosclerotic nonaneurysmal thoracic aorta. No acute intramural hematoma, dissection, pseudoaneurysm or penetrating atherosclerotic ulcer in the thoracic aorta. Visualized aortic arch branch vessels are patent. Normal caliber pulmonary arteries. No central pulmonary emboli. Mediastinum/Nodes:  No discrete thyroid nodules. Unremarkable esophagus. No pathologically enlarged axillary, mediastinal or hilar lymph nodes. Lungs/Pleura: No pneumothorax. No pleural effusion. No acute consolidative airspace disease, lung masses or significant pulmonary nodules. Musculoskeletal: No aggressive appearing focal osseous lesions. Median sternotomy wires are intact. Moderate thoracic spondylosis. Review of the MIP images confirms the above findings. CTA ABDOMEN AND PELVIS FINDINGS VASCULAR Aorta: Atherosclerotic nonaneurysmal abdominal aorta, with no dissection, vasculitis or significant stenosis. Celiac: Patent without evidence of aneurysm, dissection, vasculitis or significant stenosis. SMA: Patent without evidence of aneurysm, dissection, vasculitis or significant stenosis. Renals: Both renal arteries are patent without evidence of aneurysm, dissection, vasculitis, fibromuscular dysplasia or significant stenosis. Single renal arteries bilaterally. IMA: Patent without evidence of aneurysm, dissection, vasculitis or significant stenosis. Inflow: Patent without evidence of aneurysm, dissection, vasculitis or significant stenosis. Veins: No obvious venous abnormality within the limitations of this arterial phase  study. Review of the MIP images confirms the above findings. NON-VASCULAR Hepatobiliary: Normal liver with no liver mass. Cholelithiasis, no gallbladder wall thickening or pericholecystic fluid. No biliary ductal dilatation. Pancreas: Normal, with no mass or duct dilation. Spleen: Normal size. No mass. Adrenals/Urinary Tract: Normal adrenals. Nonobstructing 15 mm upper right renal stone. Nonobstructing 13 mm lower right renal stone. Nonobstructing 10 mm upper left renal stone. No hydronephrosis. Normal caliber ureters. Normal bladder. Stomach/Bowel: Grossly normal stomach. Normal caliber small bowel with no small bowel wall thickening. Normal appendix. Mild diffuse colonic diverticulosis, with no large bowel wall  thickening or pericolonic fat stranding. Vascular/Lymphatic: Atherosclerotic nonaneurysmal abdominal aorta. No pathologically enlarged lymph nodes in the abdomen or pelvis. Reproductive: Normal size prostate with nonspecific internal prostatic calcifications. Other: No pneumoperitoneum, ascites or focal fluid collection. Musculoskeletal: No aggressive appearing focal osseous lesions. Marked lumbar spondylosis. Review of the MIP images confirms the above findings. IMPRESSION: 1. No acute aortic syndrome. 2. No acute abnormality. No active pulmonary disease. No evidence of bowel obstruction or acute bowel inflammation. 3. Aortic atherosclerosis. Left main and 3 vessel coronary atherosclerosis status post CABG . 4. Cholelithiasis.  No evidence of acute cholecystitis . 5. Nonobstructing bilateral nephrolithiasis. 6. Mild diffuse colonic diverticulosis. Electronically Signed   By: Ilona Sorrel M.D.   On: 02/05/2017 16:33    EKG Interpretation  Date/Time:  Sunday February 05 2017 11:27:54 EDT Ventricular Rate:  70 PR Interval:    QRS Duration: 95 QT Interval:  388 QTC Calculation: 419 R Axis:   81 Text Interpretation:  Sinus rhythm Prolonged PR interval Anteroseptal infarct, age indeterminate Baseline wander in lead(s) V2 Confirmed by RAY MD, DANIELLE [phone]) on 02/05/2017 11:35:43 AM        Clinical Course as of Feb 06 1823  Sun Feb 05, 2017  1347 Ekg shows some subtle changes of st elevation in the anterior leads and subtle depression in the inferior. He does not have any pain at this time except for left shoulder pain which is chronic. I have concern for possible aortic dissection as the patient's underlying cause given his constellation of sxs (sharp cp, UE neurologic sxs, diaphoresis)   [AH]  1507 CT Angio Chest Aorta W and/or Wo Contrast [AH]  6168 Spoke with Dr. Daryll Drown, hospitalist, who agreed to come see and admit the patient.   [SJ]    Clinical Course User Index [AH] Margarita Mail, PA-C [SJ]  Lorayne Bender, PA-C    MDM   Sign out from [place], [place]. Plan: CT angio r/o dissection. Admit via medicine. Trend troponins.  Patient presents with chest pain with high risk history. No evidence of dissection on CT. Patient remains pain-free. Admission for observation and trending troponins.   Vitals:   02/05/17 1415 02/05/17 1430 02/05/17 1706 02/05/17 1732  BP: 137/64 129/74  136/67  Pulse: 62 64 66 62  Resp: 17 16  16   Temp:      TempSrc:      SpO2: 97% 94% 100% 99%   Vitals:   02/05/17 1732 02/05/17 1745 02/05/17 1800 02/05/17 1815  BP: 136/67 (!) 163/75 (!) 179/72 (!) 163/83  Pulse: 62 72 67 71  Resp: 16 20 18 17   Temp:      TempSrc:      SpO2: 99% 100% 99% 100%       Lorayne Bender, PA-C 02/05/17 1825    Duffy Bruce, MD 02/06/17 1145

## 2017-02-05 NOTE — ED Notes (Signed)
CBG 79 

## 2017-02-05 NOTE — H&P (Signed)
History and Physical    Edward [name] ZOX:[id number] DOB: 05/11/1941 DOA: 02/05/2017  PCP: Ann Held, DO  Patient coming from: Home  Chief Complaint: shoulder/chest pain  HPI: Edward [name] is a 76 y.o. 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  Edward [name] reports that while driving to church today, he developed acute, sharp, left sided shoulder pain that radiated down his arm.  It was severe and lasted 2-3 seconds, but then he developed profuse sweating. He felt this was similar to previous cardiac type pain he had before, so he stopped at the fire station and was brought to the hospital.  He had no nausea or SOB.  Morphine made the pain better.  He has had shoulder pain off an on for a while and has been told he needs surgery in that arm.  He has no reproducible pain in the chest, but some in the shoulder and limited range of motion of that shoulder.  He has had no chest pain with activity and he is due to start Cardiac Rehab.  He does note increased wheezing and increased mucus in the last couple of days and wonders if he may have pneumonia.  His heart score was 4.  He is a non smoker.  He has significant FH of cardiac disease.   ED Course: In the ED, he was found to have a mildly low K, normal Troponin and mild anemia which was chronic.  CT angio of the chest and abdomen did not show any acute findings, no aortic issues, PE or abdominal issues.  Pain improved with morphine.   Review of Systems: As per HPI otherwise 10 point review of systems negative.   Past Medical History:  Diagnosis Date  . Adenomatous colon polyp 04/2011  . CAD (coronary artery disease)    a. 01/2015 DES to LAD  b. 12/2015: [place] 85% oLCx lesion--> rx therapy.  . Chronic diastolic CHF (congestive heart failure) ([place])   . CKD (chronic kidney disease), stage II    a. probable CKD II-III with baseline CR 1.1-1.3.    . DM type 2 (diabetes mellitus, type 2), insulin dependent 01/29/2014   fasting cbg 50-120 with new regimen  . Dyslipidemia, goal LDL below 70 01/29/2014  . Enlarged prostate   . Essential tremor    a. on proprnolol  . GERD (gastroesophageal reflux disease)   . Hypertension   . Internal hemorrhoid   . Sleep apnea    does not use cpap (06/04/2015)  . TIA (transient ischemic attack) 2002    Past Surgical History:  Procedure Laterality Date  . BACK SURGERY    . CARDIAC CATHETERIZATION  01/28/14   + CAD treat medically  . CARDIAC CATHETERIZATION N/A 12/21/2015   Procedure: Right/Left Heart Cath and Coronary Angiography;  Surgeon: Belva Crome, MD;  Location: [place] [facility];  Service: Cardiovascular;  Laterality: N/A;  . CARDIAC CATHETERIZATION N/A 11/02/2016   Procedure: Left Heart Cath and Coronary Angiography;  Surgeon: Nelva Bush, MD;  Location: [place] [facility];  Service: Cardiovascular;  Laterality: N/A;  . CATARACT EXTRACTION Bilateral   . CATARACT EXTRACTION, BILATERAL Bilateral   . CORONARY ANGIOPLASTY WITH STENT PLACEMENT  01/30/2015   DES [place]  [place] to LAD by Dr Tamala Julian  . CORONARY ARTERY BYPASS GRAFT N/A 11/09/2016   Procedure: CORONARY ARTERY BYPASS GRAFTING (CABG) x 2;  Surgeon:  Melrose Nakayama, MD;  Location: [place];  Service: Open Heart Surgery;  Laterality: N/A;  . ENDOVEIN HARVEST OF GREATER SAPHENOUS VEIN Left 11/09/2016   Procedure: ENDOVEIN HARVEST OF GREATER SAPHENOUS VEIN;  Surgeon: Melrose Nakayama, MD;  Location: [place];  Service: Open Heart Surgery;  Laterality: Left;  . JOINT REPLACEMENT    . LEFT HEART CATHETERIZATION WITH CORONARY ANGIOGRAM N/A 01/28/2014   Procedure: LEFT HEART CATHETERIZATION WITH CORONARY ANGIOGRAM;  Surgeon: Sinclair Grooms, MD;  [facility];  Service: Cardiovascular;  Laterality: N/A;  . LEFT HEART CATHETERIZATION WITH CORONARY ANGIOGRAM N/A 01/30/2015   Procedure: LEFT HEART CATHETERIZATION WITH CORONARY ANGIOGRAM;   Surgeon: Belva Crome, MD;  Location: [facility] CATH LAB;  Service: Cardiovascular;  Laterality: N/A;  . LUMBAR [place]    . SHOULDER ARTHROSCOPY Right 07/30/2014   Procedure: Right Shoulder Arthroscopy, Debridement, Decompression, Manipulation Under Anesthesia;  Surgeon: Newt Minion, MD;  Location: [place];  Service: Orthopedics;  Laterality: Right;  . TEE WITHOUT CARDIOVERSION N/A 11/09/2016   Procedure: TRANSESOPHAGEAL ECHOCARDIOGRAM (TEE);  Surgeon: Melrose Nakayama, MD;  Location: [place];  Service: Open Heart Surgery;  Laterality: N/A;  . TOTAL KNEE ARTHROPLASTY Bilateral    Reviewed with patient.   reports that he has never smoked. He has never used smokeless tobacco. He reports that he does not drink alcohol or use drugs.  Allergies  Allergen Reactions  . Pneumococcal Vaccines Anaphylaxis  . No Known Allergies    Reviewed with patient, reports not knowing his parents medical history.  Family History  Problem Relation Age of Onset  . CAD Brother     2 brothers - CABG  . Alzheimer's disease Sister   . CAD Sister   . Prostate cancer Brother   . Asthma Brother     2 brothers   . Diabetes Other     entire family    Prior to Admission medications   Medication Sig Start Date End Date Taking? Authorizing Provider  acetaminophen (TYLENOL) 500 MG tablet Take 2 tablets (1,000 mg total) by mouth every 6 (six) hours as needed. 12/07/16  Yes Leo Grosser, MD  amLODipine (NORVASC) 10 MG tablet take 1 tablet by mouth once daily 01/27/17  Yes Belva Crome, MD  aspirin EC 81 MG tablet Take 81 mg by mouth daily.   Yes Historical Provider, MD  clonazePAM (KLONOPIN) 0.5 MG tablet Take 1 tablet (0.5 mg total) by mouth 2 (two) times daily as needed (tremors). 09/28/16  Yes Donika Keith Rake, DO  clopidogrel (PLAVIX) 75 MG tablet Take 1 tablet (75 mg total) by mouth daily. 12/23/16  Yes Belva Crome, MD  diclofenac sodium (VOLTAREN) 1 % GEL Apply 2 g topically 4 (four) times daily as needed (for  pain).    Yes Historical Provider, MD  furosemide (LASIX) 40 MG tablet take 1 tablet by mouth once daily 12/19/16  Yes Belva Crome, MD  gabapentin (NEURONTIN) 300 MG capsule Take 1 tablet at bedtime for one week then increase to 1 tablet twice daily Patient taking differently: Take 300 mg by mouth 2 (two) times daily.  07/28/16  Yes Donika K Patel, DO  insulin aspart (NOVOLOG) 100 UNIT/ML injection Inject 30-40 units 3 times daily with meals. Patient taking differently: Inject 40 Units into the skin 3 (three) times daily with meals.  01/25/17  Yes Philemon Kingdom, MD  insulin detemir (LEVEMIR) 100 UNIT/ML injection Inject 0.45 mLs (45 Units total) into the  skin 2 (two) times daily. Patient taking differently: Inject 70 Units into the skin at bedtime.  01/25/17  Yes Philemon Kingdom, MD  Polyvinyl Alcohol-Povidone (REFRESH OP) Place 1 drop into both eyes daily as needed (for dry eyes). Reported on 12/25/2015   Yes Historical Provider, MD  potassium chloride SA (K-DUR,KLOR-CON) 20 MEQ tablet take 1 tablet by mouth once daily 01/10/17  Yes Belva Crome, MD  propranolol (INDERAL) 40 MG tablet Take 40 mg by mouth 3 (three) times daily. 10/25/16  Yes Historical Provider, MD  VENTOLIN HFA 108 (90 BASE) MCG/ACT inhaler Inhale 2 puffs into the lungs every 6 (six) hours as needed for wheezing or shortness of breath.  06/22/15  Yes Historical Provider, MD                  Physical Exam: Vitals:   02/05/17 1845 02/05/17 1900 02/05/17 1945 02/05/17 2031  BP: (!) 153/69 (!) 154/69 (!) 149/51 139/65  Pulse: 68 70 70 72  Resp: 15 18 17 20   Temp:    98.2 F (36.8 C)  TempSrc:    Oral  SpO2: 100% 98% 97% 99%  Weight:    273 lb 9.5 oz (124.1 kg)  Height:    5\' 8"  (1.727 m)    Constitutional: NAD, calm, comfortable Vitals:   02/05/17 1845 02/05/17 1900 02/05/17 1945 02/05/17 2031  BP: (!) 153/69 (!) 154/69 (!) 149/51 139/65  Pulse: 68 70 70 72  Resp: 15 18 17 20   Temp:    98.2 F (36.8 C)  TempSrc:     Oral  SpO2: 100% 98% 97% 99%  Weight:    273 lb 9.5 oz (124.1 kg)  Height:    5\' 8"  (1.727 m)   Eyes: lids and conjunctivae normal, he has chronic skin changes around eyes ENMT: Mucous membranes are moist..Normal dentition.  Neck: normal, supple, Respiratory: CTAB, no wheezing, no crackles. Normal respiratory effort.  Cardiovascular: Regular rate and normal rhythm, no murmurs / rubs / gallops. 2+ pitting edema to bilateral lower extremities to mid calf.  No calf tenderness Abdomen: no tenderness, + distention.  +BS Musculoskeletal: no clubbing / cyanosis. Normal muscle tone. He has some tenderness to palpation of AC joint and limited ROM of the left shoulder to only 45 degrees.  He winced and reported pain with passive motion.  He had no pain with IR and ER.  Skin: no rashes, lesions, ulcers. He has a well healing midline sternotomy scar Neurologic: Moving all extremities, no focal deficit Psychiatric: Normal judgment and insight. Alert and oriented x 3. Normal mood.     Labs on Admission: I have personally reviewed following labs and imaging studies  CBC:  Recent Labs Lab 02/05/17 1256  WBC 6.6  HGB 11.7*  HCT 36.0*  MCV 84.1  PLT 528   Basic Metabolic Panel:  Recent Labs Lab 02/05/17 1256  NA 141  K 3.4*  CL 102  CO2 32  GLUCOSE 83  BUN 7  CREATININE 0.94  CALCIUM 9.0   GFR: Estimated Creatinine Clearance: 88.4 mL/min (by C-G formula based on SCr of 0.94 mg/dL). Liver Function Tests: No results for input(s): AST, ALT, ALKPHOS, BILITOT, PROT, ALBUMIN in the last 168 hours. No results for input(s): LIPASE, AMYLASE in the last 168 hours. No results for input(s): AMMONIA in the last 168 hours. Coagulation Profile: No results for input(s): INR, PROTIME in the last 168 hours. Cardiac Enzymes: No results for input(s): CKTOTAL, CKMB, CKMBINDEX, TROPONINI in the last  168 hours. BNP (last 3 results) No results for input(s): PROBNP in the last 8760 hours. HbA1C: No  results for input(s): HGBA1C in the last 72 hours. CBG:  Recent Labs Lab 02/02/17 0855 02/05/17 1732  GLUCAP 155* 79   Lipid Profile: No results for input(s): CHOL, HDL, LDLCALC, TRIG, CHOLHDL, LDLDIRECT in the last 72 hours. Thyroid Function Tests: No results for input(s): TSH, T4TOTAL, FREET4, T3FREE, THYROIDAB in the last 72 hours. Anemia Panel: No results for input(s): VITAMINB12, FOLATE, FERRITIN, TIBC, IRON, RETICCTPCT in the last 72 hours. Urine analysis:    Component Value Date/Time   COLORURINE YELLOW 11/08/2016 [place] 11/08/2016 0955   LABSPEC 1.013 11/08/2016 0955   PHURINE 6.0 11/08/2016 0955   GLUCOSEU 50 (A) 11/08/2016 0955   HGBUR NEGATIVE 11/08/2016 0955   BILIRUBINUR neg 01/02/2017 0959   KETONESUR NEGATIVE 11/08/2016 0955   PROTEINUR small 01/02/2017 0959   PROTEINUR NEGATIVE 11/08/2016 0955   UROBILINOGEN negative 01/02/2017 0959   NITRITE neg 01/02/2017 0959   NITRITE NEGATIVE 11/08/2016 0955   LEUKOCYTESUR Negative 01/02/2017 0959    Radiological Exams on Admission: Dg Chest 2 View  Result Date: 02/05/2017 CLINICAL DATA:  Acute onset chest pain, shortness breath, and diaphoresis today. Coronary artery disease. EXAM: CHEST  2 VIEW COMPARISON:  12/13/2016 FINDINGS: Low lung volumes are again demonstrated. Heart size remains at the upper limits of normal. Prior CABG again noted. Mild scarring seen in the left lung base. No evidence of pulmonary infiltrate or edema. No evidence of pleural effusion. IMPRESSION: Low lung volumes.  No acute cardiopulmonary disease. Electronically Signed   By: Earle Gell M.D.   On: 02/05/2017 15:00   Ct Angio Chest/abd/pel For Dissection W And/or W/wo  Result Date: 02/05/2017 CLINICAL DATA:  Chest pain.  CABG 11/09/2016. EXAM: CT ANGIOGRAPHY CHEST, ABDOMEN AND PELVIS TECHNIQUE: Multidetector CT imaging through the chest, abdomen and pelvis was performed using the standard protocol during bolus administration of  intravenous contrast. Multiplanar reconstructed images and MIPs were obtained and reviewed to evaluate the vascular anatomy. CONTRAST:  100 cc Isovue 370 IV. COMPARISON:  Chest radiograph from earlier today. 08/29/2016 high-resolution chest CT. FINDINGS: CTA CHEST FINDINGS Cardiovascular: Stable top-normal heart size. No significant pericardial fluid/thickening. Left main, left anterior descending, left circumflex and right coronary atherosclerosis status post CABG. Atherosclerotic nonaneurysmal thoracic aorta. No acute intramural hematoma, dissection, pseudoaneurysm or penetrating atherosclerotic ulcer in the thoracic aorta. Visualized aortic arch branch vessels are patent. Normal caliber pulmonary arteries. No central pulmonary emboli. Mediastinum/Nodes: No discrete thyroid nodules. Unremarkable esophagus. No pathologically enlarged axillary, mediastinal or hilar lymph nodes. Lungs/Pleura: No pneumothorax. No pleural effusion. No acute consolidative airspace disease, lung masses or significant pulmonary nodules. Musculoskeletal: No aggressive appearing focal osseous lesions. Median sternotomy wires are intact. Moderate thoracic spondylosis. Review of the MIP images confirms the above findings. CTA ABDOMEN AND PELVIS FINDINGS VASCULAR Aorta: Atherosclerotic nonaneurysmal abdominal aorta, with no dissection, vasculitis or significant stenosis. Celiac: Patent without evidence of aneurysm, dissection, vasculitis or significant stenosis. SMA: Patent without evidence of aneurysm, dissection, vasculitis or significant stenosis. Renals: Both renal arteries are patent without evidence of aneurysm, dissection, vasculitis, fibromuscular dysplasia or significant stenosis. Single renal arteries bilaterally. IMA: Patent without evidence of aneurysm, dissection, vasculitis or significant stenosis. Inflow: Patent without evidence of aneurysm, dissection, vasculitis or significant stenosis. Veins: No obvious venous abnormality  within the limitations of this arterial phase study. Review of the MIP images confirms the above findings. NON-VASCULAR Hepatobiliary: Normal liver with  no liver mass. Cholelithiasis, no gallbladder wall thickening or pericholecystic fluid. No biliary ductal dilatation. Pancreas: Normal, with no mass or duct dilation. Spleen: Normal size. No mass. Adrenals/Urinary Tract: Normal adrenals. Nonobstructing 15 mm upper right renal stone. Nonobstructing 13 mm lower right renal stone. Nonobstructing 10 mm upper left renal stone. No hydronephrosis. Normal caliber ureters. Normal bladder. Stomach/Bowel: Grossly normal stomach. Normal caliber small bowel with no small bowel wall thickening. Normal appendix. Mild diffuse colonic diverticulosis, with no large bowel wall thickening or pericolonic fat stranding. Vascular/Lymphatic: Atherosclerotic nonaneurysmal abdominal aorta. No pathologically enlarged lymph nodes in the abdomen or pelvis. Reproductive: Normal size prostate with nonspecific internal prostatic calcifications. Other: No pneumoperitoneum, ascites or focal fluid collection. Musculoskeletal: No aggressive appearing focal osseous lesions. Marked lumbar spondylosis. Review of the MIP images confirms the above findings. IMPRESSION: 1. No acute aortic syndrome. 2. No acute abnormality. No active pulmonary disease. No evidence of bowel obstruction or acute bowel inflammation. 3. Aortic atherosclerosis. Left main and 3 vessel coronary atherosclerosis status post CABG . 4. Cholelithiasis.  No evidence of acute cholecystitis . 5. Nonobstructing bilateral nephrolithiasis. 6. Mild diffuse colonic diverticulosis. Electronically Signed   By: Ilona Sorrel M.D.   On: 02/05/2017 16:33    EKG: Independently reviewed. NSR, flattened t waves in inferolateral leads.   Assessment/Plan Atypical chest/Shoulder pain - Edward [name] tells me that he mainly had shoulder pain, but also that it was similar to his previous chest pain prior  to intervention/CABG.  Pain is reproducible, associated with diaphoresis.  - Telemetry - Trend TnI, first negative - AM EKG - Pain control with morphine PRN - Consider PT evaluation for shoulder pain  Hypokalemia, mild - Replace orally - Recheck with BMET in the AM  CAD S/P CABG x 2 - Currently with chest pain, ruling out for ACS as noted above - Continue aspirin, plavix, restart atorvastatin - Continue good BP control  Chronic diastolic HF (heart failure)  - He has LE edema today, but no SOB and no issues with lying flat - Continue lasix     Hyperlipidemia LDL goal <70 - He has fenofibrate on his historical medications, but reports not taking this tonight.  Based on last cardiology note he was supposed to be on atorvastatin but it is not on his current medication list.  Last PCP note reports atorvastatin as well.  No listed allergy to this medication.   - Check lipid panel, starting statin given recent CABG and CAD    Essential hypertension - BP ranging from 409 - 811 systolic - Continue amlodipine, lasix    Rotator cuff arthropathy - Pain control with morphine - PT evaluation    Essential tremor - Continue propranolol    Diabetes mellitus - Last A1C in our system 7.7 - Continue home insulin dosing - SSI for further coverage    DVT prophylaxis: Lovenox Code Status: Full Disposition Plan: D/C in 1-2 days Consults called: None Admission status: Obs, telemetry   Gilles Chiquito MD [facility] Pager [phone]  If 7PM-7AM, please contact night-coverage [email or web address] Password [facility]  02/05/2017, 8:58 PM

## 2017-02-06 ENCOUNTER — Observation Stay (HOSPITAL_COMMUNITY): Payer: Medicare Other

## 2017-02-06 DIAGNOSIS — Z951 Presence of aortocoronary bypass graft: Secondary | ICD-10-CM | POA: Diagnosis not present

## 2017-02-06 DIAGNOSIS — M25512 Pain in left shoulder: Secondary | ICD-10-CM | POA: Diagnosis not present

## 2017-02-06 DIAGNOSIS — E876 Hypokalemia: Secondary | ICD-10-CM | POA: Diagnosis not present

## 2017-02-06 DIAGNOSIS — M19012 Primary osteoarthritis, left shoulder: Secondary | ICD-10-CM | POA: Diagnosis not present

## 2017-02-06 DIAGNOSIS — E1122 Type 2 diabetes mellitus with diabetic chronic kidney disease: Secondary | ICD-10-CM | POA: Diagnosis not present

## 2017-02-06 DIAGNOSIS — I2510 Atherosclerotic heart disease of native coronary artery without angina pectoris: Secondary | ICD-10-CM | POA: Diagnosis not present

## 2017-02-06 DIAGNOSIS — E1159 Type 2 diabetes mellitus with other circulatory complications: Secondary | ICD-10-CM | POA: Diagnosis not present

## 2017-02-06 DIAGNOSIS — R0789 Other chest pain: Secondary | ICD-10-CM | POA: Diagnosis not present

## 2017-02-06 DIAGNOSIS — R52 Pain, unspecified: Secondary | ICD-10-CM

## 2017-02-06 DIAGNOSIS — N183 Chronic kidney disease, stage 3 (moderate): Secondary | ICD-10-CM | POA: Diagnosis not present

## 2017-02-06 DIAGNOSIS — Z794 Long term (current) use of insulin: Secondary | ICD-10-CM

## 2017-02-06 DIAGNOSIS — G8929 Other chronic pain: Secondary | ICD-10-CM

## 2017-02-06 DIAGNOSIS — I5032 Chronic diastolic (congestive) heart failure: Secondary | ICD-10-CM | POA: Diagnosis not present

## 2017-02-06 DIAGNOSIS — I130 Hypertensive heart and chronic kidney disease with heart failure and stage 1 through stage 4 chronic kidney disease, or unspecified chronic kidney disease: Secondary | ICD-10-CM | POA: Diagnosis not present

## 2017-02-06 DIAGNOSIS — G250 Essential tremor: Secondary | ICD-10-CM | POA: Diagnosis not present

## 2017-02-06 DIAGNOSIS — E785 Hyperlipidemia, unspecified: Secondary | ICD-10-CM | POA: Diagnosis not present

## 2017-02-06 LAB — TROPONIN I

## 2017-02-06 LAB — LIPID PANEL
CHOLESTEROL: 166 mg/dL (ref 0–200)
HDL: 30 mg/dL — AB (ref >40)
LDL Cholesterol: 107 mg/dL — ABNORMAL HIGH (ref 0–99)
TRIGLYCERIDES: 146 mg/dL (ref <150)
Total CHOL/HDL Ratio: 5.5 RATIO
VLDL: 29 mg/dL (ref 0–40)

## 2017-02-06 LAB — BASIC METABOLIC PANEL
ANION GAP: 9 (ref 5–15)
BUN: 8 mg/dL (ref 6–20)
CALCIUM: 8.4 mg/dL — AB (ref 8.9–10.3)
CO2: 29 mmol/L (ref 22–32)
Chloride: 101 mmol/L (ref 101–111)
Creatinine, Ser: 0.97 mg/dL (ref 0.61–1.24)
GLUCOSE: 236 mg/dL — AB (ref 65–99)
POTASSIUM: 3.6 mmol/L (ref 3.5–5.1)
SODIUM: 139 mmol/L (ref 135–145)

## 2017-02-06 LAB — CBC
HEMATOCRIT: 36.4 % — AB (ref 39.0–52.0)
HEMOGLOBIN: 11.4 g/dL — AB (ref 13.0–17.0)
MCH: 26.6 pg (ref 26.0–34.0)
MCHC: 31.3 g/dL (ref 30.0–36.0)
MCV: 84.8 fL (ref 78.0–100.0)
Platelets: 225 10*3/uL (ref 150–400)
RBC: 4.29 MIL/uL (ref 4.22–5.81)
RDW: 15 % (ref 11.5–15.5)
WBC: 6.4 10*3/uL (ref 4.0–10.5)

## 2017-02-06 LAB — GLUCOSE, CAPILLARY
GLUCOSE-CAPILLARY: 227 mg/dL — AB (ref 65–99)
Glucose-Capillary: 199 mg/dL — ABNORMAL HIGH (ref 65–99)

## 2017-02-06 MED ORDER — GABAPENTIN 300 MG PO CAPS: 300.0000 mg | ORAL_CAPSULE | Freq: Two times a day (BID) | ORAL | Status: DC

## 2017-02-06 MED ORDER — OXYCODONE-ACETAMINOPHEN 5-325 MG PO TABS: 1.0000 | ORAL_TABLET | Freq: Three times a day (TID) | ORAL | 0 refills | Status: DC | PRN

## 2017-02-06 NOTE — Care Management Note (Signed)
Case Management Note  Patient Details  Name: Edward [name] MRN: [id number] Date of Birth: May 27, 1941  Subjective/Objective:                  Hx of HTN, TIA, Essential Tremor, HLD, DM2, CKD, dCHF, CAD s/p CABG on 11/09/16 who presented  for shoulder pain.  Action/Plan: Plan is to d/c to home today. Pt will f/u with outpatient cardiac rehab.  Expected Discharge Date:    02/06/2017           Expected Discharge Plan:  Home/Self Care  In-House Referral:     Discharge planning Services  CM Consult  Status of Service:  Completed, signed off  If discussed at [place] of Stay Meetings, dates discussed:    Additional Comments:  Sharin Mons, RN 02/06/2017, 11:13 AM

## 2017-02-06 NOTE — Evaluation (Signed)
Physical Therapy Evaluation Patient Details Name: Edward [name] MRN: [id number] DOB: 05/19/1941 Today's Date: 02/06/2017   History of Present Illness  76 y.o.malewith medical history significant of HTN, TIA, Essential Tremor, HLD, DM2, CKD, dCHF, CAD s/p CABG on 11/09/16 who presents for shoulder pain. In the ED, he was found to have a mildly low K, normal Troponin and mild anemia which was chronic. CT angio of the chest and abdomen did not show any acute findings, no aortic issues, PE or abdominal issues.   Clinical Impression  Pt is mobilizing well during initial evaluation. Pt does demonstrate continued Lt shoulder pain with palpation and motion. Pain is reproduced at the [facility] joint as well as at the deltoid musculature. ROM is limited in all planes, limiting functional use for ADLs. Recommending OPPT services to address Lt shoulder pain. Pt in agreement and denies any questions or concerns regarding returning home.     Follow Up Recommendations Outpatient PT    Equipment Recommendations  None recommended by PT    Recommendations for Other Services       Precautions / Restrictions Precautions Precautions: None Restrictions Weight Bearing Restrictions: No      Mobility  Bed Mobility Overal bed mobility: Modified Independent             General bed mobility comments: using rail to assist to sitting   Transfers Overall transfer level: Independent Equipment used: None             General transfer comment: good stability with standing.   Ambulation/Gait Ambulation/Gait assistance: Supervision Ambulation Distance (Feet): 200 Feet Assistive device: None Gait Pattern/deviations: Step-through pattern Gait velocity: decreased   General Gait Details: supervision for safety, improving stability with ambulation distance. No loss of balance.   Stairs            Wheelchair Mobility    Modified Rankin (Stroke Patients Only)       Balance Overall balance  assessment: No apparent balance deficits (not formally assessed)                                           Pertinent Vitals/Pain Pain Assessment: No/denies pain (no pain at rest, increase with Lt shld motion) Pain Location: Lt shoulder Pain Descriptors / Indicators: Sharp Pain Intervention(s): Limited activity within patient's tolerance;Monitored during session    Home Living Family/patient expects to be discharged to:: Private residence Living Arrangements: Alone Available Help at Discharge: Family;Friend(s);Neighbor;Available PRN/intermittently Type of Home: House Home Access: Level entry     Home Layout: One level Home Equipment: Cane - single point Additional Comments: Brother lives 2 blocks away and can assist as needed.     Prior Function Level of Independence: Independent         Comments: driving     Hand Dominance        Extremity/Trunk Assessment   Upper Extremity Assessment Upper Extremity Assessment: LUE deficits/detail LUE Deficits / Details: Pain with palpation at [facility] joint as well as deltoid musculature, primarily laterally. Motion is limited with flexion (<40 degrees), ER (-10 degrees), IR (lateral hip).     Lower Extremity Assessment Lower Extremity Assessment: Overall WFL for tasks assessed       Communication   Communication: No difficulties  Cognition Arousal/Alertness: Awake/alert Behavior During Therapy: WFL for tasks assessed/performed Overall Cognitive Status: Within Functional Limits for tasks assessed  General Comments      Exercises     Assessment/Plan    PT Assessment Patient needs continued PT services  PT Problem List Decreased strength;Decreased range of motion;Decreased activity tolerance;Decreased mobility;Pain       PT Treatment Interventions Gait training;Stair training;Functional mobility training;Therapeutic activities;Therapeutic  exercise;Patient/family education;Manual techniques;Modalities    PT Goals (Current goals can be found in the Care Plan section)  Acute Rehab PT Goals Patient Stated Goal: have Lt shoulder not hurt PT Goal Formulation: With patient Time For Goal Achievement: 02/13/17 Potential to Achieve Goals: Good    Frequency Min 3X/week   Barriers to discharge        Co-evaluation               End of Session   Activity Tolerance: Patient tolerated treatment well Patient left: in chair;with call bell/phone within reach;with chair alarm set Nurse Communication: Mobility status PT Visit Diagnosis: Muscle weakness (generalized) (M62.81);Pain Pain - Right/Left: Left Pain - part of body: Shoulder    Time: 5176-1607 PT Time Calculation (min) (ACUTE ONLY): 25 min   Charges:   PT Evaluation $PT Eval Moderate Complexity: 1 Procedure PT Treatments $Gait Training: 8-22 mins   PT G Codes:   PT G-Codes **NOT FOR INPATIENT CLASS** Functional Assessment Tool Used: AM-PAC 6 Clicks Basic Mobility;Clinical judgement Functional Limitation: Mobility: Walking and moving around Mobility: Walking and Moving Around Current Status (P7106): At least 20 percent but less than 40 percent impaired, limited or restricted Mobility: Walking and Moving Around Goal Status [id number]): At least 1 percent but less than 20 percent impaired, limited or restricted    Cassell Clement, PT, CSCS Pager [phone] Office [phone] 02/06/2017, 9:07 AM

## 2017-02-06 NOTE — Progress Notes (Signed)
Patient was discharged home by MD order; discharged instructions  review and give to patient with care notes and prescription; IV DIC; patient will be escorted to the car by nurse via wheelchair.

## 2017-02-06 NOTE — Discharge Summary (Signed)
Physician Discharge Summary  Edward [name] PYP:[id number] DOB: 22-Oct-1941 DOA: 02/05/2017  PCP: Ann Held, DO  Admit date: 02/05/2017 Discharge date: 02/06/2017   Recommendations for Outpatient Follow-Up:   1. outpatinet cardiac rehab 2. May need shoulder steroid injections for severe arthritis   Discharge Diagnosis:   Active Problems:   Hyperlipidemia LDL goal <70   Essential hypertension   Rotator cuff arthropathy   Essential tremor   GERD (gastroesophageal reflux disease)   Diabetes mellitus (HCC)   Chronic diastolic HF (heart failure) (HCC)   Chronic kidney disease, stage III (moderate)   S/P CABG x 2   Shoulder pain   Discharge disposition:  Home. Discharge Condition: Improved.  Diet recommendation: Low sodium, heart healthy.  Carbohydrate-modified  Wound care: None.   History of Present Illness:   Edward [name] is a 76 y.o. 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  Edward [name] reports that while driving to church today, he developed acute, sharp, left sided shoulder pain that radiated down his arm.  It was severe and lasted 2-3 seconds, but then he developed profuse sweating. He felt this was similar to previous cardiac type pain he had before, so he stopped at the fire station and was brought to the hospital.  He had no nausea or SOB.  Morphine made the pain better.  He has had shoulder pain off an on for a while and has been told he needs surgery in that arm.  He has no reproducible pain in the chest, but some in the shoulder and limited range of motion of that shoulder.  He has had no chest pain with activity and he is due to start Cardiac Rehab.  He does note increased wheezing and increased mucus in the last couple of days and wonders if he may have pneumonia.  His heart score was 4.  He is a non smoker.  He has significant FH of cardiac disease.   ED  Course: In the ED, he was found to have a mildly low K, normal Troponin and mild anemia which was chronic.  CT angio of the chest and abdomen did not show any acute findings, no aortic issues, PE or abdominal issues.  Pain improved with morphine.    Hospital Course by Problem:   Atypical chest/Shoulder pain - Edward [name] tells me that he mainly had shoulder pai  Pain is reproducible, associated with diaphoresis.  - CE negative - x ray shows severe arthritis-- may need steroid injection  Hypokalemia, mild - Replace orally  CAD S/P CABG x 2 - Currently with chest pain, ruling out for ACS as noted above - Continue aspirin, plavix, restart atorvastatin - Continue good BP control  Chronic diastolic HF (heart failure)  - He has LE edema today, but no SOB and no issues with lying flat - Continue lasix     Hyperlipidemia LDL goal <70 - He has fenofibrate on his historical medications, but reports not taking this tonight.  Based on last cardiology note he was supposed to be on atorvastatin but it is not on his current medication list.  Last PCP note reports atorvastatin as well.  No listed allergy to this medication.      Essential hypertension - BP ranging from 245 - 809 systolic - Continue amlodipine, lasix       Essential tremor - Continue propranolol    Diabetes  mellitus - Last A1C in our system 7.7 - Continue home insulin dosing     Medical Consultants:    None.   Discharge Exam:   Vitals:   02/05/17 2031 02/06/17 0545  BP: 139/65 (!) 142/72  Pulse: 72 60  Resp: 20 17  Temp: 98.2 F (36.8 C) 98 F (36.7 C)   Vitals:   02/05/17 1900 02/05/17 1945 02/05/17 2031 02/06/17 0545  BP: (!) 154/69 (!) 149/51 139/65 (!) 142/72  Pulse: 70 70 72 60  Resp: 18 17 20 17   Temp:   98.2 F (36.8 C) 98 F (36.7 C)  TempSrc:   Oral Oral  SpO2: 98% 97% 99% 100%  Weight:   124.1 kg (273 lb 9.5 oz)   Height:   5\' 8"  (1.727 m)     Gen:  NAD    The results of  significant diagnostics from this hospitalization (including imaging, microbiology, ancillary and laboratory) are listed below for reference.     Procedures and Diagnostic Studies:   Dg Chest 2 View  Result Date: 02/05/2017 CLINICAL DATA:  Acute onset chest pain, shortness breath, and diaphoresis today. Coronary artery disease. EXAM: CHEST  2 VIEW COMPARISON:  12/13/2016 FINDINGS: Low lung volumes are again demonstrated. Heart size remains at the upper limits of normal. Prior CABG again noted. Mild scarring seen in the left lung base. No evidence of pulmonary infiltrate or edema. No evidence of pleural effusion. IMPRESSION: Low lung volumes.  No acute cardiopulmonary disease. Electronically Signed   By: Earle Gell M.D.   On: 02/05/2017 15:00   Dg Shoulder Left  Result Date: 02/06/2017 CLINICAL DATA:  Left shoulder pain starting yesterday EXAM: LEFT SHOULDER - 2+ VIEW COMPARISON:  None. FINDINGS: Four views of the left shoulder submitted. Extensive degenerative changes with narrowing of joint space noted glenohumeral joint space. There is spurring of acetabulum. There is remodeling of humeral head and spurring of medial inferior aspect of humeral head. Mild spurring of distal clavicle and acromion. Mild degenerative changes AC joint. No acute fracture or subluxation. IMPRESSION: No acute fracture or subluxation. Extensive osteoarthritic changes as described above. Electronically Signed   By: Lahoma Crocker M.D.   On: 02/06/2017 12:23   Ct Angio Chest/abd/pel For Dissection W And/or W/wo  Result Date: 02/05/2017 CLINICAL DATA:  Chest pain.  CABG 11/09/2016. EXAM: CT ANGIOGRAPHY CHEST, ABDOMEN AND PELVIS TECHNIQUE: Multidetector CT imaging through the chest, abdomen and pelvis was performed using the standard protocol during bolus administration of intravenous contrast. Multiplanar reconstructed images and MIPs were obtained and reviewed to evaluate the vascular anatomy. CONTRAST:  100 cc Isovue 370 IV.  COMPARISON:  Chest radiograph from earlier today. 08/29/2016 high-resolution chest CT. FINDINGS: CTA CHEST FINDINGS Cardiovascular: Stable top-normal heart size. No significant pericardial fluid/thickening. Left main, left anterior descending, left circumflex and right coronary atherosclerosis status post CABG. Atherosclerotic nonaneurysmal thoracic aorta. No acute intramural hematoma, dissection, pseudoaneurysm or penetrating atherosclerotic ulcer in the thoracic aorta. Visualized aortic arch branch vessels are patent. Normal caliber pulmonary arteries. No central pulmonary emboli. Mediastinum/Nodes: No discrete thyroid nodules. Unremarkable esophagus. No pathologically enlarged axillary, mediastinal or hilar lymph nodes. Lungs/Pleura: No pneumothorax. No pleural effusion. No acute consolidative airspace disease, lung masses or significant pulmonary nodules. Musculoskeletal: No aggressive appearing focal osseous lesions. Median sternotomy wires are intact. Moderate thoracic spondylosis. Review of the MIP images confirms the above findings. CTA ABDOMEN AND PELVIS FINDINGS VASCULAR Aorta: Atherosclerotic nonaneurysmal abdominal aorta, with no dissection, vasculitis or significant stenosis. Celiac: Patent  without evidence of aneurysm, dissection, vasculitis or significant stenosis. SMA: Patent without evidence of aneurysm, dissection, vasculitis or significant stenosis. Renals: Both renal arteries are patent without evidence of aneurysm, dissection, vasculitis, fibromuscular dysplasia or significant stenosis. Single renal arteries bilaterally. IMA: Patent without evidence of aneurysm, dissection, vasculitis or significant stenosis. Inflow: Patent without evidence of aneurysm, dissection, vasculitis or significant stenosis. Veins: No obvious venous abnormality within the limitations of this arterial phase study. Review of the MIP images confirms the above findings. NON-VASCULAR Hepatobiliary: Normal liver with no liver  mass. Cholelithiasis, no gallbladder wall thickening or pericholecystic fluid. No biliary ductal dilatation. Pancreas: Normal, with no mass or duct dilation. Spleen: Normal size. No mass. Adrenals/Urinary Tract: Normal adrenals. Nonobstructing 15 mm upper right renal stone. Nonobstructing 13 mm lower right renal stone. Nonobstructing 10 mm upper left renal stone. No hydronephrosis. Normal caliber ureters. Normal bladder. Stomach/Bowel: Grossly normal stomach. Normal caliber small bowel with no small bowel wall thickening. Normal appendix. Mild diffuse colonic diverticulosis, with no large bowel wall thickening or pericolonic fat stranding. Vascular/Lymphatic: Atherosclerotic nonaneurysmal abdominal aorta. No pathologically enlarged lymph nodes in the abdomen or pelvis. Reproductive: Normal size prostate with nonspecific internal prostatic calcifications. Other: No pneumoperitoneum, ascites or focal fluid collection. Musculoskeletal: No aggressive appearing focal osseous lesions. Marked lumbar spondylosis. Review of the MIP images confirms the above findings. IMPRESSION: 1. No acute aortic syndrome. 2. No acute abnormality. No active pulmonary disease. No evidence of bowel obstruction or acute bowel inflammation. 3. Aortic atherosclerosis. Left main and 3 vessel coronary atherosclerosis status post CABG . 4. Cholelithiasis.  No evidence of acute cholecystitis . 5. Nonobstructing bilateral nephrolithiasis. 6. Mild diffuse colonic diverticulosis. Electronically Signed   By: Ilona Sorrel M.D.   On: 02/05/2017 16:33     Labs:   Basic Metabolic Panel:  Recent Labs Lab 02/05/17 1256 02/06/17 0147  NA 141 139  K 3.4* 3.6  CL 102 101  CO2 32 29  GLUCOSE 83 236*  BUN 7 8  CREATININE 0.94 0.97  CALCIUM 9.0 8.4*   GFR Estimated Creatinine Clearance: 85.7 mL/min (by C-G formula based on SCr of 0.97 mg/dL). Liver Function Tests: No results for input(s): AST, ALT, ALKPHOS, BILITOT, PROT, ALBUMIN in the last  168 hours. No results for input(s): LIPASE, AMYLASE in the last 168 hours. No results for input(s): AMMONIA in the last 168 hours. Coagulation profile No results for input(s): INR, PROTIME in the last 168 hours.  CBC:  Recent Labs Lab 02/05/17 1256 02/06/17 0147  WBC 6.6 6.4  HGB 11.7* 11.4*  HCT 36.0* 36.4*  MCV 84.1 84.8  PLT 233 225   Cardiac Enzymes:  Recent Labs Lab 02/05/17 2040 02/05/17 2339 02/06/17 0147  TROPONINI <0.03 <0.03 <0.03   BNP: Invalid input(s): POCBNP CBG:  Recent Labs Lab 02/05/17 1732 02/05/17 2120 02/05/17 2253 02/06/17 0758 02/06/17 1233  GLUCAP 79 226* 230* 199* 227*   D-Dimer No results for input(s): DDIMER in the last 72 hours. Hgb A1c No results for input(s): HGBA1C in the last 72 hours. Lipid Profile  Recent Labs  02/06/17 0147  CHOL 166  HDL 30*  LDLCALC 107*  TRIG 146  CHOLHDL 5.5   Thyroid function studies No results for input(s): TSH, T4TOTAL, T3FREE, THYROIDAB in the last 72 hours.  Invalid input(s): FREET3 Anemia work up No results for input(s): VITAMINB12, FOLATE, FERRITIN, TIBC, IRON, RETICCTPCT in the last 72 hours. Microbiology No results found for this or any previous visit (from the past 240  hour(s)).   Discharge Instructions:   Discharge Instructions    Ambulatory referral to Physical Therapy    Complete by:  As directed    Diet - low sodium heart healthy    Complete by:  As directed    Increase activity slowly    Complete by:  As directed      Allergies as of 02/06/2017      Reactions   Pneumococcal Vaccines Anaphylaxis   No Known Allergies       Medication List    STOP taking these medications   fenofibrate 160 MG tablet   meclizine 25 MG tablet Commonly known as:  ANTIVERT     TAKE these medications   acetaminophen 500 MG tablet Commonly known as:  TYLENOL Take 2 tablets (1,000 mg total) by mouth every 6 (six) hours as needed.   amLODipine 10 MG tablet Commonly known as:   NORVASC take 1 tablet by mouth once daily   aspirin EC 81 MG tablet Take 81 mg by mouth daily.   clonazePAM 0.5 MG tablet Commonly known as:  KLONOPIN Take 1 tablet (0.5 mg total) by mouth 2 (two) times daily as needed (tremors).   clopidogrel 75 MG tablet Commonly known as:  PLAVIX Take 1 tablet (75 mg total) by mouth daily.   diclofenac sodium 1 % Gel Commonly known as:  VOLTAREN Apply 2 g topically 4 (four) times daily as needed (for pain).   furosemide 40 MG tablet Commonly known as:  LASIX take 1 tablet by mouth once daily   gabapentin 300 MG capsule Commonly known as:  NEURONTIN Take 1 capsule (300 mg total) by mouth 2 (two) times daily.   insulin aspart 100 UNIT/ML injection Commonly known as:  NOVOLOG Inject 30-40 units 3 times daily with meals. What changed:  how much to take  how to take this  when to take this  additional instructions   insulin detemir 100 UNIT/ML injection Commonly known as:  LEVEMIR Inject 0.45 mLs (45 Units total) into the skin 2 (two) times daily. What changed:  how much to take  when to take this   oxyCODONE-acetaminophen 5-325 MG tablet Commonly known as:  ROXICET Take 1 tablet by mouth every 8 (eight) hours as needed for severe pain.   potassium chloride SA 20 MEQ tablet Commonly known as:  K-DUR,KLOR-CON take 1 tablet by mouth once daily   propranolol 40 MG tablet Commonly known as:  INDERAL Take 40 mg by mouth 3 (three) times daily.   REFRESH OP Place 1 drop into both eyes daily as needed (for dry eyes). Reported on 12/25/2015   VENTOLIN HFA 108 (90 Base) MCG/ACT inhaler Generic drug:  albuterol Inhale 2 puffs into the lungs every 6 (six) hours as needed for wheezing or shortness of breath.         Time coordinating discharge: 35 min  Signed:  Arturo Freundlich [address]   [facility] 02/06/2017, 1:48 PM

## 2017-02-07 ENCOUNTER — Other Ambulatory Visit: Payer: Self-pay | Admitting: Family Medicine

## 2017-02-07 DIAGNOSIS — M25519 Pain in unspecified shoulder: Secondary | ICD-10-CM

## 2017-02-08 ENCOUNTER — Telehealth (HOSPITAL_COMMUNITY): Payer: Self-pay | Admitting: Cardiac Rehabilitation

## 2017-02-08 NOTE — Telephone Encounter (Signed)
-----   Message from Ann Held, [place] sent at 02/08/2017 12:15 PM EDT ----- Regarding: RE: cardiac rehab  I have not seen him--- we are getting him to ortho thought because apparently the shoulder is causing a lot of pain----he should be able to do rehab as long as you take that into consideration.   Thanks    Dr Etter Sjogren ----- Message ----- From: Lowell Guitar, RN Sent: 02/08/2017  10:57 AM To: Ann Held, DO Subject: cardiac rehab                                  Dear Dr. Carollee Herter,   Pt is recently evaluated in ED for chest pain/shoulder pain.  R/O for ACS.  However diagnosed with severe shoulder arthritis.  Is it ok for him to begin cardiac rehab?  Are there any activity restrictions?  Thank you, Andi Hence, RN, BSN Cardiac Pulmonary Rehab

## 2017-02-09 ENCOUNTER — Encounter (HOSPITAL_COMMUNITY): Payer: Self-pay

## 2017-02-09 ENCOUNTER — Ambulatory Visit (INDEPENDENT_AMBULATORY_CARE_PROVIDER_SITE_OTHER): Payer: Medicare Other | Admitting: Orthopedic Surgery

## 2017-02-09 ENCOUNTER — Telehealth (HOSPITAL_COMMUNITY): Payer: Self-pay | Admitting: *Deleted

## 2017-02-09 ENCOUNTER — Encounter (HOSPITAL_COMMUNITY)
Admission: RE | Admit: 2017-02-09 | Discharge: 2017-02-09 | Disposition: A | Discharge status: Home / Self Care | Payer: Medicare Other | Source: Ambulatory Visit | Attending: Interventional Cardiology | Admitting: Interventional Cardiology

## 2017-02-09 VITALS — BP 150/68 | HR 95 | Ht 67.25 in | Wt 275.4 lb

## 2017-02-09 DIAGNOSIS — Z951 Presence of aortocoronary bypass graft: Secondary | ICD-10-CM | POA: Diagnosis not present

## 2017-02-09 DIAGNOSIS — I130 Hypertensive heart and chronic kidney disease with heart failure and stage 1 through stage 4 chronic kidney disease, or unspecified chronic kidney disease: Secondary | ICD-10-CM | POA: Diagnosis not present

## 2017-02-09 DIAGNOSIS — J849 Interstitial pulmonary disease, unspecified: Secondary | ICD-10-CM | POA: Diagnosis not present

## 2017-02-09 DIAGNOSIS — I5032 Chronic diastolic (congestive) heart failure: Secondary | ICD-10-CM | POA: Diagnosis not present

## 2017-02-09 DIAGNOSIS — I2510 Atherosclerotic heart disease of native coronary artery without angina pectoris: Secondary | ICD-10-CM | POA: Diagnosis not present

## 2017-02-09 DIAGNOSIS — N182 Chronic kidney disease, stage 2 (mild): Secondary | ICD-10-CM | POA: Diagnosis not present

## 2017-02-09 NOTE — Telephone Encounter (Signed)
Returned call to pt.  Pt has upcoming appt on 4/19 with orthopedic md for evaluation of shoulder discomfort   Pt with recent ER visit regarding shoulder pain.  Pt will hold on beginning cardiac rehab until evaluation completed and plan of care determined. Cherre Huger, BSN Cardiac and Training and development officer

## 2017-02-09 NOTE — Progress Notes (Signed)
Cardiac Individual Treatment Plan  Patient Details  Name: Edward [name] MRN: [id number] Date of Birth: 06/11/1941 Referring Provider:     [facility] from 02/09/2017 in [place]  Referring Provider  Daneen Schick MD      Initial Encounter Date:    CARDIAC REHAB PHASE II ORIENTATION from 02/09/2017 in [place]  Date  02/09/17  Referring Provider  Daneen Schick MD      Visit Diagnosis: 11/09/16 S/P CABG x 2  ILD (interstitial lung disease) ([place])  Patient's Home Medications on Admission:  Current Outpatient Prescriptions:  .  acetaminophen (TYLENOL) 500 MG tablet, Take 2 tablets (1,000 mg total) by mouth every 6 (six) hours as needed., Disp: 30 tablet, Rfl: 0 .  amLODipine (NORVASC) 10 MG tablet, take 1 tablet by mouth once daily, Disp: 30 tablet, Rfl: 10 .  aspirin EC 81 MG tablet, Take 81 mg by mouth daily., Disp: , Rfl:  .  clonazePAM (KLONOPIN) 0.5 MG tablet, Take 1 tablet (0.5 mg total) by mouth 2 (two) times daily as needed (tremors)., Disp: 45 tablet, Rfl: 3 .  clopidogrel (PLAVIX) 75 MG tablet, Take 1 tablet (75 mg total) by mouth daily., Disp: 90 tablet, Rfl: 3 .  diclofenac sodium (VOLTAREN) 1 % GEL, Apply 2 g topically 4 (four) times daily as needed (for pain). , Disp: , Rfl:  .  furosemide (LASIX) 40 MG tablet, take 1 tablet by mouth once daily, Disp: 90 tablet, Rfl: 1 .  gabapentin (NEURONTIN) 300 MG capsule, Take 1 capsule (300 mg total) by mouth 2 (two) times daily., Disp: , Rfl:  .  insulin aspart (NOVOLOG) 100 UNIT/ML injection, Inject 30-40 units 3 times daily with meals. (Patient taking differently: Inject 40 Units into the skin 3 (three) times daily with meals. ), Disp: 30 mL, Rfl: 11 .  insulin detemir (LEVEMIR) 100 UNIT/ML injection, Inject 0.45 mLs (45 Units total) into the skin 2 (two) times daily. (Patient taking differently: Inject 70 Units into the skin at bedtime. ), Disp:  30 mL, Rfl: 11 .  oxyCODONE-acetaminophen (ROXICET) 5-325 MG tablet, Take 1 tablet by mouth every 8 (eight) hours as needed for severe pain., Disp: 10 tablet, Rfl: 0 .  Polyvinyl Alcohol-Povidone (REFRESH OP), Place 1 drop into both eyes daily as needed (for dry eyes). Reported on 12/25/2015, Disp: , Rfl:  .  potassium chloride SA (K-DUR,KLOR-CON) 20 MEQ tablet, take 1 tablet by mouth once daily, Disp: 30 tablet, Rfl: 11 .  propranolol (INDERAL) 40 MG tablet, Take 40 mg by mouth 3 (three) times daily., Disp: , Rfl: 0 .  VENTOLIN HFA 108 (90 BASE) MCG/ACT inhaler, Inhale 2 puffs into the lungs every 6 (six) hours as needed for wheezing or shortness of breath. , Disp: , Rfl: 0  Past Medical History: Past Medical History:  Diagnosis Date  . Adenomatous colon polyp 04/2011  . CAD (coronary artery disease)    a. 01/2015 DES to LAD  b. 12/2015: [place] 85% oLCx lesion--> rx therapy.  . Chronic diastolic CHF (congestive heart failure) ([place])   . CKD (chronic kidney disease), stage II    a. probable CKD II-III with baseline CR 1.1-1.3.  . DM type 2 (diabetes mellitus, type 2), insulin dependent 01/29/2014   fasting cbg 50-120 with new regimen  . Dyslipidemia, goal LDL below 70 01/29/2014  . Enlarged prostate   . Essential tremor    a. on proprnolol  .  GERD (gastroesophageal reflux disease)   . Hypertension   . Internal hemorrhoid   . Sleep apnea    does not use cpap (06/04/2015)  . TIA (transient ischemic attack) 2002    Tobacco Use: History  Smoking Status  . Never Smoker  Smokeless Tobacco  . Never Used    Labs: Recent Review Flowsheet Data    Labs for ITP Cardiac and Pulmonary Rehab Latest Ref Rng & Units 11/09/2016 11/10/2016 12/16/2016 01/02/2017 02/06/2017   Cholestrol 0 - 200 mg/dL - - - 143 166   LDLCALC 0 - 99 mg/dL - - - 88 107(H)   LDLDIRECT mg/dL - - - - -   HDL >40 mg/dL - - - 37.70(L) 30(L)   Trlycerides <150 mg/dL - - - 87.0 146   Hemoglobin A1c - - - 7.7  - -   PHART 7.350 - 7.450  7.341(L) - - - -   PCO2ART 32.0 - 48.0 mmHg 45.1 - - - -   HCO3 20.0 - 28.0 mmol/L 24.2 - - - -   TCO2 0 - 100 mmol/L 26 24 - - -   ACIDBASEDEF 0.0 - 2.0 mmol/L 2.0 - - - -   O2SAT % 97.0 - - - -      Capillary Blood Glucose: Lab Results  Component Value Date   GLUCAP 227 (H) 02/06/2017   GLUCAP 199 (H) 02/06/2017   GLUCAP 230 (H) 02/05/2017   GLUCAP 226 (H) 02/05/2017   GLUCAP 79 02/05/2017     Exercise Target Goals: Date: 02/09/17  Exercise Program Goal: Individual exercise prescription set with THRR, safety & activity barriers. Participant demonstrates ability to understand and report RPE using BORG scale, to self-measure pulse accurately, and to acknowledge the importance of the exercise prescription.  Exercise Prescription Goal: Starting with aerobic activity 30 plus minutes a day, 3 days per week for initial exercise prescription. Provide home exercise prescription and guidelines that participant acknowledges understanding prior to discharge.  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 HR 120 bpm     Max Ex. BP 194/70     2 Minute Post BP 128/72        Oxygen Initial Assessment:   Oxygen Re-Evaluation:   Oxygen Discharge (Final Oxygen Re-Evaluation):   Initial Exercise Prescription:     Initial Exercise Prescription - 02/09/17 1200       Date of Initial Exercise RX and Referring Provider   Date 02/09/17   Referring Provider Daneen Schick MD     NuStep   Level 2   Minutes 20  METs 2.1     Track   Laps 8   Minutes 10   METs 2.4     Prescription Details   Frequency (times per week) 5   Duration Progress to 45 minutes of aerobic exercise without signs/symptoms of physical distress     Intensity   THRR 40-80% of Max Heartrate 58-117   Ratings of Perceived Exertion 11-13   Perceived Dyspnea 0-4     Progression   Progression Continue to progress workloads to maintain intensity without signs/symptoms of physical distress.     Resistance Training   Training Prescription Yes   Weight 2   Reps 10-15      Perform Capillary Blood Glucose checks as needed.  Exercise Prescription Changes:   Exercise Comments:   Exercise Goals and Review:     Exercise Goals    Row Name 02/02/17 0931             Exercise Goals   Increase Physical Activity Yes       Intervention Provide advice, education, support and counseling about physical activity/exercise needs.;Develop an individualized exercise prescription for aerobic and resistive training based on initial evaluation findings, risk stratification, comorbidities and participant's personal goals.       Expected Outcomes Achievement of increased cardiorespiratory fitness and enhanced flexibility, muscular endurance and strength shown through measurements of functional capacity and personal statement of participant.       Increase Strength and Stamina Yes       Intervention Provide advice, education, support and counseling about physical activity/exercise needs.;Develop an individualized exercise prescription for aerobic and resistive training based on initial evaluation findings, risk stratification, comorbidities and participant's personal goals.       Expected Outcomes Achievement of increased cardiorespiratory fitness and enhanced flexibility, muscular endurance and  strength shown through measurements of functional capacity and personal statement of participant.          Exercise Goals Re-Evaluation :    Discharge Exercise Prescription (Final Exercise Prescription Changes):   Nutrition:  Target Goals: Understanding of nutrition guidelines, daily intake of sodium 1500mg , cholesterol 200mg , calories 30% from fat and 7% or less from saturated fats, daily to have 5 or more servings of fruits and vegetables.  Biometrics:     Pre Biometrics - 02/02/17 1643      Pre Biometrics   Height 5' 7.25" (1.708 m)   Weight 281 lb 1.4 oz (127.5 kg)   Waist Circumference 52 inches   Hip Circumference 52.25 inches   Waist to Hip Ratio 1 %   BMI (Calculated) 43.8   Triceps Skinfold 34 mm   Grip Strength 34 kg   Flexibility 0 in       Nutrition Therapy Plan and Nutrition Goals:     Nutrition Therapy & Goals - 02/03/17 1154      Nutrition Therapy   Diet Carb Modified, Therapeutic Lifestyle Changes     Personal Nutrition Goals   Nutrition Goal Wt loss goal of 1-2 lb/week to a wt loss goal of 6-24 lb at graduation from [place] Goal #2 Improved glycemic control as evidenced by pt's A1c trending from 7.7 toward less than 7.0     Intervention Plan   Intervention Prescribe, educate and counsel regarding individualized specific dietary modifications aiming towards targeted core components such as weight, hypertension, lipid management, diabetes, heart failure and other comorbidities.   Expected Outcomes Short Term Goal: Understand basic principles of dietary content, such as calories, fat, sodium, cholesterol  and nutrients.;Long Term Goal: Adherence to prescribed nutrition plan.      Nutrition Discharge: Nutrition Scores:   Nutrition Goals Re-Evaluation:   Nutrition Goals Re-Evaluation:   Nutrition Goals Discharge (Final Nutrition Goals Re-Evaluation):   Psychosocial: Target Goals: Acknowledge presence or absence of  significant depression and/or stress, maximize coping skills, provide positive support system. Participant is able to verbalize types and ability to use techniques and skills needed for reducing stress and depression.  Initial Review & Psychosocial Screening:     Initial Psych Review & Screening - 02/09/17 1608      Initial Review   Current issues with Current Anxiety/Panic     Family Dynamics   Good Support System? Yes   Comments pt brother cares for pt.  Pt resides alone.     Barriers   Psychosocial barriers to participate in program The patient should benefit from training in stress management and relaxation.      Quality of Life Scores:   PHQ-9: Recent Review Flowsheet Data    Depression screen [facility] 2/9 12/08/2015 12/07/2015 06/04/2015   Decreased Interest 0 1 0   Down, Depressed, Hopeless 0 1 0   PHQ - 2 Score 0 2 0   Altered sleeping - 0 -   Tired, decreased energy - 3 -   Change in appetite - 1 -   Feeling bad or failure about yourself  - 0 -   Trouble concentrating - 1 -   Moving slowly or fidgety/restless - 0 -   Suicidal thoughts - 0 -   PHQ-9 Score - 7 -   Difficult doing work/chores - Somewhat difficult -     Interpretation of Total Score  Total Score Depression Severity:  1-4 = Minimal depression, 5-9 = Mild depression, 10-14 = Moderate depression, 15-19 = Moderately severe depression, 20-27 = Severe depression   Psychosocial Evaluation and Intervention:   Psychosocial Re-Evaluation:   Psychosocial Discharge (Final Psychosocial Re-Evaluation):   Vocational Rehabilitation: Provide vocational rehab assistance to qualifying candidates.   Vocational Rehab Evaluation & Intervention:     Vocational Rehab - 02/09/17 1610      Initial Vocational Rehab Evaluation & Intervention   Assessment shows need for Vocational Rehabilitation No      Education: Education Goals: Education classes will be provided on a weekly basis, covering required topics.  Participant will state understanding/return demonstration of topics presented.  Learning Barriers/Preferences:     Learning Barriers/Preferences - 02/02/17 0929      Learning Barriers/Preferences   Learning Barriers Sight   Learning Preferences Computer/Internet;Video;Pictoral      Education Topics: Count Your Pulse:  -Group instruction provided by verbal instruction, demonstration, patient participation and written materials to support subject.  Instructors address importance of being able to find your pulse and how to count your pulse when at home without a heart monitor.  Patients get hands on experience counting their pulse with staff help and individually.   Heart Attack, Angina, and Risk Factor Modification:  -Group instruction provided by verbal instruction, video, and written materials to support subject.  Instructors address signs and symptoms of angina and heart attacks.    Also discuss risk factors for heart disease and how to make changes to improve heart health risk factors.   Functional Fitness:  -Group instruction provided by verbal instruction, demonstration, patient participation, and written materials to support subject.  Instructors address safety measures for doing things around the house.  Discuss how to get up and down off the floor,  how to pick things up properly, how to safely get out of a chair without assistance, and balance training.   Meditation and Mindfulness:  -Group instruction provided by verbal instruction, patient participation, and written materials to support subject.  Instructor addresses importance of mindfulness and meditation practice to help reduce stress and improve awareness.  Instructor also leads participants through a meditation exercise.    Stretching for Flexibility and Mobility:  -Group instruction provided by verbal instruction, patient participation, and written materials to support subject.  Instructors lead participants through  series of stretches that are designed to increase flexibility thus improving mobility.  These stretches are additional exercise for major muscle groups that are typically performed during regular warm up and cool down.   Hands Only CPR Anytime:  -Group instruction provided by verbal instruction, video, patient participation and written materials to support subject.  Instructors co-teach with AHA video for hands only CPR.  Participants get hands on experience with mannequins.   Nutrition I class: Heart Healthy Eating:  -Group instruction provided by PowerPoint slides, verbal discussion, and written materials to support subject matter. The instructor gives an explanation and review of the Therapeutic Lifestyle Changes diet recommendations, which includes a discussion on lipid goals, dietary fat, sodium, fiber, plant stanol/sterol esters, sugar, and the components of a well-balanced, healthy diet.   Nutrition II class: Lifestyle Skills:  -Group instruction provided by PowerPoint slides, verbal discussion, and written materials to support subject matter. The instructor gives an explanation and review of label reading, grocery shopping for heart health, heart healthy recipe modifications, and ways to make healthier choices when eating out.   Diabetes Question & Answer:  -Group instruction provided by PowerPoint slides, verbal discussion, and written materials to support subject matter. The instructor gives an explanation and review of diabetes co-morbidities, pre- and post-prandial blood glucose goals, pre-exercise blood glucose goals, signs, symptoms, and treatment of hypoglycemia and hyperglycemia, and foot care basics.   Diabetes Blitz:  -Group instruction provided by PowerPoint slides, verbal discussion, and written materials to support subject matter. The instructor gives an explanation and review of the physiology behind type 1 and type 2 diabetes, diabetes medications and rational behind using  different medications, pre- and post-prandial blood glucose recommendations and Hemoglobin A1c goals, diabetes diet, and exercise including blood glucose guidelines for exercising safely.    Portion Distortion:  -Group instruction provided by PowerPoint slides, verbal discussion, written materials, and food models to support subject matter. The instructor gives an explanation of serving size versus portion size, changes in portions sizes over the last 20 years, and what consists of a serving from each food group.   Stress Management:  -Group instruction provided by verbal instruction, video, and written materials to support subject matter.  Instructors review role of stress in heart disease and how to cope with stress positively.     Exercising on Your Own:  -Group instruction provided by verbal instruction, power point, and written materials to support subject.  Instructors discuss benefits of exercise, components of exercise, frequency and intensity of exercise, and end points for exercise.  Also discuss use of nitroglycerin and activating EMS.  Review options of places to exercise outside of rehab.  Review guidelines for sex with heart disease.   Cardiac Drugs I:  -Group instruction provided by verbal instruction and written materials to support subject.  Instructor reviews cardiac drug classes: antiplatelets, anticoagulants, beta blockers, and statins.  Instructor discusses reasons, side effects, and lifestyle considerations for each drug class.  Cardiac Drugs II:  -Group instruction provided by verbal instruction and written materials to support subject.  Instructor reviews cardiac drug classes: angiotensin converting enzyme inhibitors (ACE-I), angiotensin II receptor blockers (ARBs), nitrates, and calcium channel blockers.  Instructor discusses reasons, side effects, and lifestyle considerations for each drug class.   Anatomy and Physiology of the Circulatory System:  -Group  instruction provided by verbal instruction, video, and written materials to support subject.  Reviews functional anatomy of heart, how it relates to various diagnoses, and what role the heart plays in the overall system.   Knowledge Questionnaire Score:     Knowledge Questionnaire Score - 02/02/17 1646      Knowledge Questionnaire Score   Pre Score 20/24      Core Components/Risk Factors/Patient Goals at Admission:     Personal Goals and Risk Factors at Admission - 02/02/17 0934      Core Components/Risk Factors/Patient Goals on Admission    Weight Management Yes;Obesity;Weight Loss   Intervention Weight Management: Develop a combined nutrition and exercise program designed to reach desired caloric intake, while maintaining appropriate intake of nutrient and fiber, sodium and fats, and appropriate energy expenditure required for the weight goal.;Weight Management: Provide education and appropriate resources to help participant work on and attain dietary goals.;Obesity: Provide education and appropriate resources to help participant work on and attain dietary goals.;Weight Management/Obesity: Establish reasonable short term and long term weight goals.   Expected Outcomes Short Term: Continue to assess and modify interventions until short term weight is achieved;Weight Loss: Understanding of general recommendations for a balanced deficit meal plan, which promotes 1-2 lb weight loss per week and includes a negative energy balance of [phone] kcal/d;Understanding recommendations for meals to include 15-35% energy as protein, 25-35% energy from fat, 35-60% energy from carbohydrates, less than 200mg  of dietary cholesterol, 20-35 gm of total fiber daily;Understanding of distribution of calorie intake throughout the day with the consumption of 4-5 meals/snacks   Diabetes Yes   Intervention Provide education about signs/symptoms and action to take for hypo/hyperglycemia.;Provide education about proper  nutrition, including hydration, and aerobic/resistive exercise prescription along with prescribed medications to achieve blood glucose in normal ranges: Fasting glucose 65-99 mg/dL   Expected Outcomes Short Term: Participant verbalizes understanding of the signs/symptoms and immediate care of hyper/hypoglycemia, proper foot care and importance of medication, aerobic/resistive exercise and nutrition plan for blood glucose control.;Long Term: Attainment of HbA1C < 7%.   Hypertension Yes   Intervention Provide education on lifestyle modifcations including regular physical activity/exercise, weight management, moderate sodium restriction and increased consumption of fresh fruit, vegetables, and low fat dairy, alcohol moderation, and smoking cessation.;Monitor prescription use compliance.   Expected Outcomes Short Term: Continued assessment and intervention until BP is < 140/61mm HG in hypertensive participants. < 130/85mm HG in hypertensive participants with diabetes, heart failure or chronic kidney disease.;Long Term: Maintenance of blood pressure at goal levels.   Lipids Yes   Intervention Provide education and support for participant on nutrition & aerobic/resistive exercise along with prescribed medications to achieve LDL 70mg , HDL >40mg .   Expected Outcomes Short Term: Participant states understanding of desired cholesterol values and is compliant with medications prescribed. Participant is following exercise prescription and nutrition guidelines.;Long Term: Cholesterol controlled with medications as prescribed, with individualized exercise RX and with personalized nutrition plan. Value goals: LDL < 70mg , HDL > 40 mg.   Personal Goal Other Yes   Personal Goal Learn nutrition and improve motivation to exercise and be more active.   Intervention  Provide nutrition counsling and exercise programming to assist with weighloss goals, making dietary changes and developing an exercise routine   Expected Outcomes  Pt will build on aerobic activity, learn HH and diabetic diet and lose weight      Core Components/Risk Factors/Patient Goals Review:    Core Components/Risk Factors/Patient Goals at Discharge (Final Review):    ITP Comments:     ITP Comments    Row Name 02/02/17 0927           ITP Comments Medical Director, Dr. Fransico Him          Comments:  Pt returns today for completing the remaining assessment for cardiac rehab participation from 0930 to 1030 am.  Pt completed warm up stretches and 6 minute walk test.  Pt tolerated walk test well with no complaints of cp or sob. Pt used rolater for support.  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  Pt remarked that he was seen in the ER on last Sunday due to shoulder discomfort.  Pt advised to see his orthopedic MD for further evaluation and treatment. Pt had not yet contacted his physician but planned to do so on his way back home.  Pt advised we will hold on full exercise until pt is seen and evaluated in the office and plan of care is determined.  Pt agreeable to this and is eager to get started with full exercise.  Brief psychosocial assessment reveals that pt has some anxiety and nervousness regarding having another heart event. Pt wold benefit from attending stress management and meditation classes.  Pt is eager to start full exercise in cardiac rehab. Cherre Huger, BSN Cardiac and Training and development officer

## 2017-02-10 ENCOUNTER — Encounter (HOSPITAL_COMMUNITY): Payer: Medicare Other

## 2017-02-10 ENCOUNTER — Encounter (HOSPITAL_COMMUNITY): Payer: Self-pay

## 2017-02-13 ENCOUNTER — Encounter (HOSPITAL_COMMUNITY): Payer: Medicare Other

## 2017-02-16 ENCOUNTER — Ambulatory Visit (INDEPENDENT_AMBULATORY_CARE_PROVIDER_SITE_OTHER): Payer: Medicare Other | Admitting: Family

## 2017-02-16 ENCOUNTER — Encounter (INDEPENDENT_AMBULATORY_CARE_PROVIDER_SITE_OTHER): Payer: Self-pay | Admitting: Orthopedic Surgery

## 2017-02-16 DIAGNOSIS — I25110 Atherosclerotic heart disease of native coronary artery with unstable angina pectoris: Secondary | ICD-10-CM

## 2017-02-16 DIAGNOSIS — M25512 Pain in left shoulder: Secondary | ICD-10-CM

## 2017-02-16 DIAGNOSIS — M7502 Adhesive capsulitis of left shoulder: Secondary | ICD-10-CM

## 2017-02-16 DIAGNOSIS — G8929 Other chronic pain: Secondary | ICD-10-CM

## 2017-02-16 MED ORDER — LIDOCAINE HCL 1 % IJ SOLN
5.0000 mL | INTRAMUSCULAR | Status: AC | PRN
  Administered 2017-02-16: 5 mL

## 2017-02-16 MED ORDER — METHYLPREDNISOLONE ACETATE 40 MG/ML IJ SUSP
40.0000 mg | INTRAMUSCULAR | Status: AC | PRN
  Administered 2017-02-16: 40 mg via INTRA_ARTICULAR

## 2017-02-16 NOTE — Progress Notes (Signed)
Office Visit Note   Patient: Edward [name]           Date of Birth: August 14, 1941           MRN: [id number] Visit Date: 02/16/2017              Requested by: Ann Held, DO [address] [place], [place] [address] PCP: Ann Held, DO  Chief Complaint  Patient presents with  . Left Shoulder - Pain      HPI: The patient is a 76 year old gentleman who presents today for complaining of chronic left shoulder pain. States that he did happen to have a triple bypass since last visit. Is requesting repeat left shoulder injection with cortisone. States this is worked well for him in the past. The pain has been so great that he thought he was having another heart attack. States he was evaluated by his primary care found this was musculoskeletal pain. States he is doing cardiac rehabilitation Monday Wednesday Friday. He is unable to do all the exercises in the routine due to shoulder pain on the left.  Assessment & Plan: Visit Diagnoses:  1. Adhesive capsulitis of left shoulder   2. Chronic left shoulder pain     Plan: Injection today. He will call Malachy Mood the arthroscopic debridement of the left shoulder at his convenience. States that he prefers October of November of this coming year.  Follow-Up Instructions: Return if symptoms worsen or fail to improve.   Left Shoulder Exam   Tenderness  The patient is experiencing tenderness in the biceps tendon.  Range of Motion  Active Abduction: 70  Passive Abduction: 80   Tests  Impingement: positive  Other  Erythema: absent Sensation: normal       Patient is alert, oriented, no adenopathy, well-dressed, normal affect, normal respiratory effort.  Imaging: No results found.  Labs: Lab Results  Component Value Date   HGBA1C 7.7 12/16/2016   HGBA1C 9.4 09/13/2016   HGBA1C 9.4 06/13/2016   REPTSTATUS 04/16/2014 FINAL 04/16/2014   CULT  04/16/2014    NO GROWTH 5 DAYS Performed at [facility] Multiple organisms present,each less than 07/05/2016   LABORGA 10,000 CFU/mL. 07/05/2016   LABORGA These organisms,commonly found on external 07/05/2016   LABORGA and internal genitalia,are considered colonizers. 07/05/2016   LABORGA No further testing performed. 07/05/2016    Orders:  No orders of the defined types were placed in this encounter.  No orders of the defined types were placed in this encounter.    Procedures: Large Joint Inj Date/Time: 02/16/2017 1:28 PM Performed by: Suzan Slick Authorized by: Dondra Prader R   Consent Given by:  Patient Site marked: the procedure site was marked   Timeout: prior to procedure the correct patient, procedure, and site was verified   Indications:  Pain and diagnostic evaluation Location:  Shoulder Site:  L subacromial bursa Prep: patient was prepped and draped in usual sterile fashion   Needle Size:  22 G Needle Length:  1.5 inches Ultrasound Guidance: No   Fluoroscopic Guidance: No   Arthrogram: No   Medications:  5 mL lidocaine 1 %; 40 mg methylPREDNISolone acetate 40 MG/ML Aspiration Attempted: No   Patient tolerance:  Patient tolerated the procedure well with no immediate complications    Clinical Data: No additional findings.  ROS:  All other systems negative, except as noted in the HPI. Review of Systems  Constitutional: Negative for  chills and fever.  Respiratory: Negative for chest tightness and shortness of breath.   Cardiovascular: Negative for chest pain.  Musculoskeletal: Positive for arthralgias.    Objective: Vital Signs: There were no vitals taken for this visit.  Specialty Comments:  No specialty comments available.  PMFS History: Patient Active Problem List   Diagnosis Date Noted  . Shoulder pain 02/05/2017  . 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 Hypertensive heart disease without heart failure   . Chronic kidney disease, stage III (moderate) 11/03/2016  . Chronic diastolic HF  (heart failure) ([place])   . Flank pain, acute 07/06/2016  . Dizziness   . Hypokalemia 04/19/2016  . Chest pain 04/19/2016  . Near syncope 04/19/2016  . Lightheaded 04/19/2016  . Diabetes mellitus ([place]) 03/11/2016  . Atherosclerosis of native coronary artery of native heart with unstable angina pectoris ([place])   . Wheezing 11/30/2015  . GERD (gastroesophageal reflux disease) 08/27/2015  . Acute bronchitis 08/27/2015  . Dyspnea 07/08/2015  . OSA (obstructive sleep apnea) 06/22/2015  . Essential tremor 04/23/2015  . Rotator cuff arthropathy 07/30/2014  . CAP (community acquired pneumonia) 04/16/2014  . Severe obesity (BMI >= 40) ([place]) 02/11/2014  . Hyperlipidemia LDL goal <70 01/29/2014  . Essential hypertension 01/29/2014   Past Medical History:  Diagnosis Date  . Adenomatous colon polyp 04/2011  . CAD (coronary artery disease)    a. 01/2015 DES to LAD  b. 12/2015: [place] 85% oLCx lesion--> rx therapy.  . Chronic diastolic CHF (congestive heart failure) ([place])   . CKD (chronic kidney disease), stage II    a. probable CKD II-III with baseline CR 1.1-1.3.  . DM type 2 (diabetes mellitus, type 2), insulin dependent 01/29/2014   fasting cbg 50-120 with new regimen  . Dyslipidemia, goal LDL below 70 01/29/2014  . Enlarged prostate   . Essential tremor    a. on proprnolol  . GERD (gastroesophageal reflux disease)   . Hypertension   . Internal hemorrhoid   . Sleep apnea    does not use cpap (06/04/2015)  . TIA (transient ischemic attack) 2002    Family History  Problem Relation Age of Onset  . CAD Brother     2 brothers - CABG  . Alzheimer's disease Sister   . CAD Sister   . Prostate cancer Brother   . Asthma Brother     2 brothers   . Diabetes Other     entire family    Past Surgical History:  Procedure Laterality Date  . BACK SURGERY    . CARDIAC CATHETERIZATION  01/28/14   + CAD treat medically  . CARDIAC CATHETERIZATION N/A 12/21/2015   Procedure: Right/Left Heart Cath and  Coronary Angiography;  Surgeon: Belva Crome, MD;  Location: [place] [facility];  Service: Cardiovascular;  Laterality: N/A;  . CARDIAC CATHETERIZATION N/A 11/02/2016   Procedure: Left Heart Cath and Coronary Angiography;  Surgeon: Nelva Bush, MD;  Location: [facility];  Service: Cardiovascular;  Laterality: N/A;  . CATARACT EXTRACTION Bilateral   . CATARACT EXTRACTION, BILATERAL Bilateral   . CORONARY ANGIOPLASTY WITH STENT PLACEMENT  01/30/2015   DES Promus  Premier to LAD by Dr Tamala Julian  . CORONARY ARTERY BYPASS GRAFT N/A 11/09/2016   Procedure: CORONARY ARTERY BYPASS GRAFTING (CABG) x 2;  Surgeon: Melrose Nakayama, MD;  Location: [facility];  Service: Open Heart Surgery;  Laterality: N/A;  . ENDOVEIN HARVEST OF GREATER SAPHENOUS VEIN Left 11/09/2016   Procedure: ENDOVEIN HARVEST OF GREATER  SAPHENOUS VEIN;  Surgeon: Melrose Nakayama, MD;  Location: [facility];  Service: Open Heart Surgery;  Laterality: Left;  . JOINT REPLACEMENT    . LEFT HEART CATHETERIZATION WITH CORONARY ANGIOGRAM N/A 01/28/2014   Procedure: LEFT HEART CATHETERIZATION WITH CORONARY ANGIOGRAM;  Surgeon: Sinclair Grooms, MD;  Location[facility] C[facility];  Service: Cardiovascular;  Laterality: N/A;  . LEFT HEART CATHETERIZATION WITH CORONARY ANGIOGRAM N/A 01/30/2015   Procedure: LEFT HEART CATHETERIZATION WITH CORONARY ANGIOGRAM;  Surgeon: Belva Crome, MD;  Location: [facility] CATH LAB;  Service: Cardiovascular;  Laterality: N/A;  . LUMBAR [place]    . SHOULDER ARTHROSCOPY Right 07/30/2014   Procedure: Right Shoulder Arthroscopy, Debridement, Decompression, Manipulation Under Anesthesia;  Surgeon: Newt Minion, MD;  Location: [facility];  Service: Orthopedics;  Laterality: Right;  . TEE WITHOUT CARDIOVERSION N/A 11/09/2016   Procedure: TRANSESOPHAGEAL ECHOCARDIOGRAM (TEE);  Surgeon: Melrose Nakayama, MD;  Location: [facility];  Service: Open Heart Surgery;  Laterality: N/A;  . TOTAL KNEE ARTHROPLASTY Bilateral    Social History    Occupational History  . retired    Social History Main Topics  . Smoking status: Never Smoker  . Smokeless tobacco: Never Used  . Alcohol use No  . Drug use: No  . Sexual activity: No

## 2017-02-17 ENCOUNTER — Encounter (HOSPITAL_COMMUNITY): Payer: Medicare Other

## 2017-02-20 ENCOUNTER — Encounter (HOSPITAL_COMMUNITY)
Admission: RE | Admit: 2017-02-20 | Discharge: 2017-02-20 | Disposition: A | Discharge status: Home / Self Care | Payer: Medicare Other | Source: Ambulatory Visit | Attending: Interventional Cardiology | Admitting: Interventional Cardiology

## 2017-02-20 ENCOUNTER — Telehealth (INDEPENDENT_AMBULATORY_CARE_PROVIDER_SITE_OTHER): Payer: Self-pay | Admitting: Radiology

## 2017-02-20 DIAGNOSIS — I5032 Chronic diastolic (congestive) heart failure: Secondary | ICD-10-CM | POA: Diagnosis not present

## 2017-02-20 DIAGNOSIS — I130 Hypertensive heart and chronic kidney disease with heart failure and stage 1 through stage 4 chronic kidney disease, or unspecified chronic kidney disease: Secondary | ICD-10-CM | POA: Diagnosis not present

## 2017-02-20 DIAGNOSIS — J849 Interstitial pulmonary disease, unspecified: Secondary | ICD-10-CM | POA: Diagnosis not present

## 2017-02-20 DIAGNOSIS — Z951 Presence of aortocoronary bypass graft: Secondary | ICD-10-CM | POA: Diagnosis not present

## 2017-02-20 DIAGNOSIS — I2510 Atherosclerotic heart disease of native coronary artery without angina pectoris: Secondary | ICD-10-CM | POA: Diagnosis not present

## 2017-02-20 DIAGNOSIS — N182 Chronic kidney disease, stage 2 (mild): Secondary | ICD-10-CM | POA: Diagnosis not present

## 2017-02-20 LAB — GLUCOSE, CAPILLARY
Glucose-Capillary: 184 mg/dL — ABNORMAL HIGH (ref 65–99)
Glucose-Capillary: 126 mg/dL — ABNORMAL HIGH (ref 65–99)

## 2017-02-20 NOTE — Progress Notes (Signed)
Cardiology Office Note    Date:  02/23/2017   ID:  Edward [name], DOB December 26, 1940, MRN [id number]  PCP:  Ann Held, DO  Cardiologist:  Dr. Tamala Julian   CC: follow up  History of Present Illness:  Edward [name] is a 76 y.o. male with a history of type II DM, CKD Stage II, HTN, HLD and CAD s/p previous PCIs and CABG x2V who presents to clinic for follow up.   He has a history of complex CAD. Cath 01/30/2015 with 85% mid LAD lesion treated with DES, 30-50% distal RCA lesion, EF 55%. He had shortness of breath with Brilinta however this persisted despite switching him to Effient. Was seen by Dr. [place] Bells in 11/2015 and he felt shortness of breath was related to obesity and deconditioning. He had a stress test in 06/2015 with apical scar, no current ischemia: low risk test. He had been working with pulmonary rehabilitation and trying and increase activity and loose weight.    He was admitted to [facility] from 2/17-2/23/17 for dyspnea and chest pain worrisome for [place]. Upon presentation he had reported gaining 25 pounds over the past 6 months. it was decided to proceed with left and right heart cath: Right heart cath given his ongoing dyspnea and inability to measure pulmonary pressure by echo. He underwent L&RHC 12/21/15 which revealed 40-50% L main with cross sectional area by IVUS 5.96 mm at most severe (borderline), 85% ostial LCx (culprit lesion), widely patent mLAD stent, mod non critical disease in RCA, and acute on chronic D/CHF with markedly elevated filling pressures (LVEDP 30/PCWP 24) as well as mod pulm HTN. Plan was to consult [facility] for CABG vs stenting from the circumflex back into the left main, although this would require meticulous technique and could put the LAD at risk. [facility] saw patient and felt that CABG would probably result in pain relief but no survival benefit. Dr. Roxan Hockey discussed with Dr Tamala Julian who felt it was most appropriate to attempt to control sx with medical  therapy. If he fails optimal medical therapy then either CABG or PCI would be an acceptable option for control of angina. He was started on Imdur which was titrated up. He continued to have chest pain and was started on Ranexa 1000mg  BID. Additionally, he was started on IV Lasix due to elevated filling pressures on right heart cath and diuresed well.   He was admitted again to [facility] from 10/29/16-11/15/16 for [place]. He was taken to the cath lab on 11/02/2016 and was found to have severe 2 vessel CAD with LM involvement. He was referred for CABG. He was taken to the operating room on 11/09/2016. He underwent CABG x 2 utilizing LIMA to LAD and SVG to OM. He also underwent endoscopic harvest of his greater saphenous vein from his left thigh. His post op course was uneventful and he was discharged to SNF.   He was last seen by Dr. Tamala Julian in 12/2016 and he was doing well. His metoprolol was discontinued given that he was on propranolol 40mg  TID (for essential tremor) and lisinopril (given cough). His effient was switched to plavix to mitigate bleeding risk.   He was admitted 4/8-02/06/17 for chest pain. He ruled out and was discharged home.   Today he presents to clinic for follow up. He has been feeling well. No more chest pain. He thinks that chest pain was actually from bone spurs and he has since had some injections by orthopedics and he  has been feeling better. Breathing is good. He has loved working with cardiac rehab. BP has been elevated at rest and with exertion. No LE edema, orthopnea or PND. No dizziness or syncope. No blood in stool or urine. He has some intermittent palpitations that are self limited. He does have some leg pain (cramping like) in his legs mostly at night and in the AM. Clonazepam seems to help that.   Past Medical History:  Diagnosis Date  . Adenomatous colon polyp 04/2011  . CAD (coronary artery disease)    a. 01/2015 DES to LAD  b. 12/2015: [place] 85% oLCx lesion--> rx therapy.  . Chronic  diastolic CHF (congestive heart failure) ([place])   . CKD (chronic kidney disease), stage II    a. probable CKD II-III with baseline CR 1.1-1.3.  . DM type 2 (diabetes mellitus, type 2), insulin dependent 01/29/2014   fasting cbg 50-120 with new regimen  . Dyslipidemia, goal LDL below 70 01/29/2014  . Enlarged prostate   . Essential tremor    a. on proprnolol  . GERD (gastroesophageal reflux disease)   . Hypertension   . Internal hemorrhoid   . Sleep apnea    does not use cpap (06/04/2015)  . TIA (transient ischemic attack) 2002    Past Surgical History:  Procedure Laterality Date  . BACK SURGERY    . CARDIAC CATHETERIZATION  01/28/14   + CAD treat medically  . CARDIAC CATHETERIZATION N/A 12/21/2015   Procedure: Right/Left Heart Cath and Coronary Angiography;  Surgeon: Belva Crome, MD;  Location: [place] [facility];  Service: Cardiovascular;  Laterality: N/A;  . CARDIAC CATHETERIZATION N/A 11/02/2016   Procedure: Left Heart Cath and Coronary Angiography;  Surgeon: Nelva Bush, MD;  Location: [place] [facility];  Service: Cardiovascular;  Laterality: N/A;  . CATARACT EXTRACTION Bilateral   . CATARACT EXTRACTION, BILATERAL Bilateral   . CORONARY ANGIOPLASTY WITH STENT PLACEMENT  01/30/2015   DES Promus  [place] to LAD by Dr Tamala Julian  . CORONARY ARTERY BYPASS GRAFT N/A 11/09/2016   Procedure: CORONARY ARTERY BYPASS GRAFTING (CABG) x 2;  Surgeon: Melrose Nakayama, MD;  Location: [place];  Service: Open Heart Surgery;  Laterality: N/A;  . ENDOVEIN HARVEST OF GREATER SAPHENOUS VEIN Left 11/09/2016   Procedure: ENDOVEIN HARVEST OF GREATER SAPHENOUS VEIN;  Surgeon: Melrose Nakayama, MD;  Location: [place];  Service: Open Heart Surgery;  Laterality: Left;  . JOINT REPLACEMENT    . LEFT HEART CATHETERIZATION WITH CORONARY ANGIOGRAM N/A 01/28/2014   Procedure: LEFT HEART CATHETERIZATION WITH CORONARY ANGIOGRAM;  Surgeon: Sinclair Grooms, MD;  Location: [facility] CATH LAB;  Service: Cardiovascular;   Laterality: N/A;  . LEFT HEART CATHETERIZATION WITH CORONARY ANGIOGRAM N/A 01/30/2015   Procedure: LEFT HEART CATHETERIZATION WITH CORONARY ANGIOGRAM;  Surgeon: Belva Crome, MD;  Location: [facility] CATH LAB;  Service: Cardiovascular;  Laterality: N/A;  . LUMBAR [place]    . SHOULDER ARTHROSCOPY Right 07/30/2014   Procedure: Right Shoulder Arthroscopy, Debridement, Decompression, Manipulation Under Anesthesia;  Surgeon: Newt Minion, MD;  Location: [place];  Service: Orthopedics;  Laterality: Right;  . TEE WITHOUT CARDIOVERSION N/A 11/09/2016   Procedure: TRANSESOPHAGEAL ECHOCARDIOGRAM (TEE);  Surgeon: Melrose Nakayama, MD;  Location: [facility];  Service: Open Heart Surgery;  Laterality: N/A;  . TOTAL KNEE ARTHROPLASTY Bilateral     Current Medications: Outpatient Medications Prior to Visit  Medication Sig Dispense Refill  . acetaminophen (TYLENOL) 500 MG tablet Take 2 tablets (1,000 mg  total) by mouth every 6 (six) hours as needed. 30 tablet 0  . amLODipine (NORVASC) 10 MG tablet take 1 tablet by mouth once daily 30 tablet 10  . aspirin EC 81 MG tablet Take 81 mg by mouth daily.    . clonazePAM (KLONOPIN) 0.5 MG tablet Take 1 tablet (0.5 mg total) by mouth 2 (two) times daily as needed (tremors). 45 tablet 3  . clopidogrel (PLAVIX) 75 MG tablet Take 1 tablet (75 mg total) by mouth daily. 90 tablet 3  . diclofenac sodium (VOLTAREN) 1 % GEL Apply 2 g topically 4 (four) times daily as needed (for pain).     . furosemide (LASIX) 40 MG tablet take 1 tablet by mouth once daily 90 tablet 1  . gabapentin (NEURONTIN) 300 MG capsule Take 1 capsule (300 mg total) by mouth 2 (two) times daily.    . insulin aspart (NOVOLOG) 100 UNIT/ML injection Inject 30-40 units 3 times daily with meals. (Patient taking differently: Inject 40 Units into the skin 3 (three) times daily with meals. ) 30 mL 11  . insulin detemir (LEVEMIR) 100 UNIT/ML injection Inject 0.45 mLs (45 Units total) into the skin 2 (two) times daily.  (Patient taking differently: Inject 70 Units into the skin at bedtime. ) 30 mL 11  . Polyvinyl Alcohol-Povidone (REFRESH OP) Place 1 drop into both eyes daily as needed (for dry eyes). Reported on 12/25/2015    . potassium chloride SA (K-DUR,KLOR-CON) 20 MEQ tablet take 1 tablet by mouth once daily 30 tablet 11  . propranolol (INDERAL) 40 MG tablet Take 40 mg by mouth 3 (three) times daily.  0  . VENTOLIN HFA 108 (90 BASE) MCG/ACT inhaler Inhale 2 puffs into the lungs every 6 (six) hours as needed for wheezing or shortness of breath.   0  . oxyCODONE-acetaminophen (ROXICET) 5-325 MG tablet Take 1 tablet by mouth every 8 (eight) hours as needed for severe pain. 10 tablet 0   No facility-administered medications prior to visit.      Allergies:   Pneumococcal vaccines and No known allergies   Social History   Social History  . Marital status: Married    Spouse name: N/A  . Number of children: 0  . Years of education: N/A   Occupational History  . retired    Social History Main Topics  . Smoking status: Never Smoker  . Smokeless tobacco: Never Used  . Alcohol use No  . Drug use: No  . Sexual activity: No   Other Topics Concern  . None   Social History Narrative   He is a Geophysicist/field seismologist by trade.   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   He lives alone.  His wife is living in [place] at this time.  They do not have children.   Highest level of education:  College graduate     Family History:  The patient's family history includes Alzheimer's disease in his sister; Asthma in his brother; CAD in his brother and sister; Diabetes in his other; Prostate cancer in his brother.      ROS:   Please see the history of present illness.    ROS All other systems reviewed and are negative.   PHYSICAL EXAM:   VS:  BP (!) 164/70   Pulse 92   Ht 5\' 8"  (1.727 m)   Wt 265 lb 12.8 oz (120.6 kg)   SpO2 98%   BMI 40.41 kg/m    GEN: Well  nourished, well developed,  in no acute distress, obese HEENT: normal  Neck: no JVD, carotid bruits, or masses Cardiac: RRR; no murmurs, rubs, or gallops,no edema  Respiratory:  clear to auscultation bilaterally, normal work of breathing GI: soft, nontender, nondistended, + BS MS: no deformity or atrophy  Skin: warm and dry, no rash Neuro:  Alert and Oriented x 3, Strength and sensation are intact Psych: euthymic mood, full affect    Wt Readings from Last 3 Encounters:  02/23/17 265 lb 12.8 oz (120.6 kg)  02/09/17 275 lb 5.7 oz (124.9 kg)  02/05/17 273 lb 9.5 oz (124.1 kg)      Studies/Labs Reviewed:   EKG:  EKG is NOT ordered today.    Recent Labs: 10/29/2016: B Natriuretic Peptide 21.4; TSH 2.344 11/10/2016: Magnesium 1.9 01/02/2017: ALT 21 02/06/2017: BUN 8; Creatinine, Ser 0.97; Hemoglobin 11.4; Platelets 225; Potassium 3.6; Sodium 139   Lipid Panel    Component Value Date/Time   CHOL 166 02/06/2017 0147   TRIG 146 02/06/2017 0147   HDL 30 (L) 02/06/2017 0147   CHOLHDL 5.5 02/06/2017 0147   VLDL 29 02/06/2017 0147   LDLCALC 107 (H) 02/06/2017 0147   LDLDIRECT 144.0 07/08/2015 1344    Additional studies/ records that were reviewed today include:   Cath 11/02/16  Conclusions:  Significant LMCA (60%) and ostial LCx (85%) stenoses. Eccentric mid LMCA stenosis appears somewhat worse than prior angiogram in 12/2015. Lesion was borderline significant at that time by IVUS.  Patent mid LAD stent with mild to moderate proximal LAD, LCx/OM, and RCA disease.  Mildly elevated left ventricular filling pressure.    Recommendations:  Cardiac surgery consultation for CABG, given progression of LMCA disease.  Discontinue clopidogrel in anticipation of possible CABG.  Restart heparin infusion 4 hours after TR band removal.  Continue aggressive secondary prevention.    Findings and recommendations were discussed with Dr. Tamala Julian.    2D ECHO: 11/02/2016  LV EF: 60% -  65%  Study Conclusions  - Left  ventricle: The cavity size was normal. Wall thickness was  increased in a pattern of moderate LVH. Systolic function was  normal. The estimated ejection fraction was in the range of 60%  to 65%. Wall motion was normal; there were no regional wall  motion abnormalities. Doppler parameters are consistent with  abnormal left ventricular relaxation (grade 1 diastolic  dysfunction).  - Aortic valve: There was no stenosis.  - Mitral valve: Mildly calcified annulus. There was trivial  regurgitation.  - Left atrium: The atrium was mildly dilated.  - Right ventricle: The cavity size was mildly dilated. Systolic  function was normal.  - Right atrium: The atrium was mildly dilated.  - Tricuspid valve: Peak RV-RA gradient (S): 34 mm Hg.  - Pulmonary arteries: PA peak pressure: 37 mm Hg (S).  - Inferior vena cava: The vessel was normal in size. The  respirophasic diameter changes were in the normal range (>= 50%),  consistent with normal central venous pressure.  Impressions:  - Normal LV size with moderate LV hypertrophy. EF 60-65%. Mildly  dilated RV with normal systolic function. No significant valvular  abnormalities. Mild pulmonary hypertension.    ASSESSMENT & PLAN:   CAD s/p CABG 2V: stable. Continue ASA/plavix, BB and statin (atorvastatin 80mg  was not on med list but he is taking it, will add this back)  HTN: uncontrolled.  Lisinopril discontinued in 12/2016 2/2 cough. Will start him on Losartan 25mg  daily with a BMET in 1-2 weeks. We  can titrate his up as needed for better BP control. I will ask Verdis Frederickson at cardiac rehab to keep an eye on it. If elevated we will increase Losartan to 50mg  daily.   HLD: continue atorva 80mg  daily.   Chronic diastolic CHF: appears euvolemic. continue lasix 40mg  daily   DMT2: continue current regimen  Essential tremor: takes propanolol and clonazepam. He has run out of clonazepam 0.5mg  BID and asks for a refill. I told him I can give  him 1 months worth but cannot fill it after this. He will follow up with PCP or Dr. Posey Pronto (neurologist) after this for further refills.   Morbid obesity: Body mass index is 40.41 kg/m. working with a nutritionist    Medication Adjustments/Labs and Tests Ordered: Current medicines are reviewed at length with the patient today.  Concerns regarding medicines are outlined above.  Medication changes, Labs and Tests ordered today are listed in the Patient Instructions below. Patient Instructions  Medication Instructions:  Your physician has recommended you make the following change in your medication: 1.  START Losartan 25 mg taking 1 tablet daily   Labwork: TODAY:  BMET 2 WEEKS:  BMET  Testing/Procedures: None ordered  Follow-Up: Your physician recommends that you schedule a follow-up appointment in: SEE DR. Tamala Julian AS PLANNED   Any Other Special Instructions Will Be Listed Below (If Applicable).   If you need a refill on your cardiac medications before your next appointment, please call your pharmacy.      Signed, Angelena Form, PA-C  02/23/2017 11:14 AM    [facility] [place], [place], [place]  [address] Phone: [phone]; Fax: [phone]

## 2017-02-20 NOTE — Progress Notes (Signed)
Daily Session Note  Patient Details  Name: Edward [name] MRN: [id number] Date of Birth: 01-09-1941 Referring Provider:     CARDIAC REHAB PHASE II ORIENTATION from 02/09/2017 in [place]  Referring Provider  Daneen Schick MD      Encounter Date: 02/20/2017  Check In:     Session Check In - 02/20/17 1115      Check-In   Staff Present Maurice Small, RN, BSN;Amber Fair, MS, ACSM RCEP, Exercise Physiologist;Olinty [place], MS, ACSM CEP, Exercise Physiologist;Jeaneen Cala, RN, BSN;Molly diVincenzo, MS, ACSM RCEP, Exercise Physiologist      Capillary Blood Glucose: Results for orders placed or performed during the hospital encounter of 02/20/17 (from the past 24 hour(s))  Glucose, capillary     Status: Abnormal   Collection Time: 02/20/17  9:43 AM  Result Value Ref Range   Glucose-Capillary 184 (H) 65 - 99 mg/dL  Glucose, capillary     Status: Abnormal   Collection Time: 02/20/17 10:42 AM  Result Value Ref Range   Glucose-Capillary 126 (H) 65 - 99 mg/dL      History  Smoking Status  . Never Smoker  Smokeless Tobacco  . Never Used    Goals Met:  No report of cardiac concerns or symptoms  Goals Unmet:  Not Applicable  Comments: Shaheen started cardiac rehab today.  Pt tolerated light exercise without difficulty.Trygve received a cortisone injection to his left shoulder last week and reports feeling much better.  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  Medication list reconciled. Pt denies barriers to medicaiton compliance.  PSYCHOSOCIAL ASSESSMENT:  PHQ-1.Izick reports feeling down sometimes but denies feeling depressed today. Pt exhibits positive coping skills, hopeful outlook with supportive family. No psychosocial needs identified at this time, no psychosocial interventions necessary.    Pt enjoys going to church and taking pictures..   Pt oriented to exercise equipment  and routine.    Understanding verbalized. Mackey's systolic  blood pressures were in the 140's to 150's resting and the exertional diastolic blood pressures were in the 160's.Will fax exercise flow sheets to Dr. Thompson Caul  office for review. Rose took all of his medications this morning except his furosemide. [place] has a follow up appointment with Bonney Leitz on Thursday.Will continue to monitor the patient throughout  the program. Marquail plans to return to exercise on Monday. Zadrian participates in exercise twice a week Monday's and Friday's. Tremain is not coming on Friday of this week due to another appointment.[name], RN,BSN 02/20/2017 2:06 PM   Dr. Fransico Him is Medical Director for Cardiac Rehab at [facility].

## 2017-02-20 NOTE — Telephone Encounter (Signed)
Edward [name] had an injection in his shoulder on Thursday 02/16/2017, He is able to participate in cardiac rehab and he is able to do what he is comfortable doing.

## 2017-02-22 ENCOUNTER — Ambulatory Visit: Payer: Medicare Other | Admitting: Physician Assistant

## 2017-02-22 NOTE — Progress Notes (Signed)
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  acetaminophen (TYLENOL) 500 MG tablet, Take 2 tablets (1,000 mg total) by mouth every 6 (six) hours as needed., Disp: 30 tablet, Rfl: 0 .  amLODipine (NORVASC) 10 MG tablet, take 1 tablet by mouth once daily, Disp: 30 tablet, Rfl: 10 .  aspirin EC 81 MG tablet, Take 81 mg by mouth daily., Disp: , Rfl:  .  clonazePAM (KLONOPIN) 0.5 MG tablet, Take 1 tablet (0.5 mg total) by mouth 2 (two) times daily as needed (tremors)., Disp: 45 tablet, Rfl: 3 .  clopidogrel (PLAVIX) 75 MG tablet, Take 1 tablet (75 mg total) by mouth daily., Disp: 90 tablet, Rfl: 3 .  diclofenac sodium (VOLTAREN) 1 % GEL, Apply 2 g topically 4 (four) times daily as needed (for pain). , Disp: , Rfl:  .  furosemide (LASIX) 40 MG tablet, take 1 tablet by mouth once daily, Disp: 90 tablet, Rfl: 1 .  gabapentin (NEURONTIN) 300 MG capsule, Take 1 capsule (300 mg total) by mouth 2 (two) times daily., Disp: , Rfl:  .  insulin aspart (NOVOLOG) 100 UNIT/ML injection, Inject 30-40 units 3 times daily with meals. (Patient taking differently: Inject 40 Units into the skin 3 (three) times daily with meals. ), Disp: 30 mL, Rfl: 11 .  insulin detemir (LEVEMIR) 100 UNIT/ML injection, Inject 0.45 mLs (45 Units total) into the skin 2 (two) times daily. (Patient taking differently: Inject 70 Units into the skin at bedtime. ), Disp: 30 mL, Rfl: 11 .  Polyvinyl  Alcohol-Povidone (REFRESH OP), Place 1 drop into both eyes daily as needed (for dry eyes). Reported on 12/25/2015, Disp: , Rfl:  .  potassium chloride SA (K-DUR,KLOR-CON) 20 MEQ tablet, take 1 tablet by mouth once daily, Disp: 30 tablet, Rfl: 11 .  propranolol (INDERAL) 40 MG tablet, Take 40 mg by mouth 3 (three) times daily., Disp: , Rfl: 0 .  VENTOLIN HFA 108 (90 BASE) MCG/ACT inhaler, Inhale 2 puffs into the lungs every 6 (six) hours as needed for wheezing or shortness of breath. , Disp: , Rfl: 0 .  oxyCODONE-acetaminophen (ROXICET) 5-325 MG tablet, Take 1 tablet by mouth every 8 (eight) hours as needed for severe pain. (Patient not taking: Reported on 02/20/2017), Disp: 10 tablet, Rfl: 0  Past Medical History: Past Medical History:  Diagnosis Date  . Adenomatous colon polyp 04/2011  . CAD (coronary artery disease)    a. 01/2015 DES to LAD  b. 12/2015: [place] 85% oLCx lesion--> rx therapy.  . Chronic diastolic CHF (congestive heart failure) ([place])   . CKD (chronic kidney disease), stage II    a. probable CKD II-III with baseline CR 1.1-1.3.  . DM type 2 (diabetes mellitus, type 2), insulin dependent 01/29/2014   fasting cbg 50-120 with new regimen  . Dyslipidemia, goal LDL below 70 01/29/2014  . Enlarged prostate   . Essential tremor    a. on proprnolol  .  GERD (gastroesophageal reflux disease)   . Hypertension   . Internal hemorrhoid   . Sleep apnea    does not use cpap (06/04/2015)  . TIA (transient ischemic attack) 2002    Tobacco Use: History  Smoking Status  . Never Smoker  Smokeless Tobacco  . Never Used    Labs: Recent Review Flowsheet Data    Labs for ITP Cardiac and Pulmonary Rehab Latest Ref Rng & Units 11/09/2016 11/10/2016 12/16/2016 01/02/2017 02/06/2017   Cholestrol 0 - 200 mg/dL - - - 143 166   LDLCALC 0 - 99 mg/dL - - - 88 107(H)   LDLDIRECT mg/dL - - - - -   HDL >40 mg/dL - - - 37.70(L) 30(L)   Trlycerides <150 mg/dL - - - 87.0 146   Hemoglobin A1c - - - 7.7  - -   PHART  7.350 - 7.450 7.341(L) - - - -   PCO2ART 32.0 - 48.0 mmHg 45.1 - - - -   HCO3 20.0 - 28.0 mmol/L 24.2 - - - -   TCO2 0 - 100 mmol/L 26 24 - - -   ACIDBASEDEF 0.0 - 2.0 mmol/L 2.0 - - - -   O2SAT % 97.0 - - - -      Capillary Blood Glucose: Lab Results  Component Value Date   GLUCAP 126 (H) 02/20/2017   GLUCAP 184 (H) 02/20/2017   GLUCAP 227 (H) 02/06/2017   GLUCAP 199 (H) 02/06/2017   GLUCAP 230 (H) 02/05/2017     Exercise Target Goals:    Exercise Program Goal: Individual exercise prescription set with THRR, safety & activity barriers. Participant demonstrates ability to understand and report RPE using BORG scale, to self-measure pulse accurately, and to acknowledge the importance of the exercise prescription.  Exercise Prescription Goal: Starting with aerobic activity 30 plus minutes a day, 3 days per week for initial exercise prescription. Provide home exercise prescription and guidelines that participant acknowledges understanding prior to discharge.  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 HR 120 bpm     Max Ex. BP 194/70     2 Minute Post BP 128/72        Oxygen Initial Assessment:   Oxygen Re-Evaluation:   Oxygen Discharge (Final Oxygen Re-Evaluation):   Initial Exercise Prescription:     Initial Exercise Prescription - 02/09/17  1200      Date of Initial Exercise RX and Referring Provider   Date 02/09/17   Referring Provider Daneen Schick MD     NuStep   Level 2   Minutes 20  METs 2.1     Track   Laps 8   Minutes 10   METs 2.4     Prescription Details   Frequency (times per week) 5   Duration Progress to 45 minutes of aerobic exercise without signs/symptoms of physical distress     Intensity   THRR 40-80% of Max Heartrate 58-117   Ratings of Perceived Exertion 11-13   Perceived Dyspnea 0-4     Progression   Progression Continue to progress workloads to maintain intensity without signs/symptoms of physical distress.     Resistance Training   Training Prescription Yes   Weight 2   Reps 10-15      Perform Capillary Blood Glucose checks as needed.  Exercise Prescription Changes:   Exercise Comments:   Exercise Goals and Review:     Exercise Goals    Row Name 02/02/17 0931             Exercise Goals   Increase Physical Activity Yes       Intervention Provide advice, education, support and counseling about physical activity/exercise needs.;Develop an individualized exercise prescription for aerobic and resistive training based on initial evaluation findings, risk stratification, comorbidities and participant's personal goals.       Expected Outcomes Achievement of increased cardiorespiratory fitness and enhanced flexibility, muscular endurance and strength shown through measurements of functional capacity and personal statement of participant.       Increase Strength and Stamina Yes       Intervention Provide advice, education, support and counseling about physical activity/exercise needs.;Develop an individualized exercise prescription for aerobic and resistive training based on initial evaluation findings, risk stratification, comorbidities and participant's personal goals.       Expected Outcomes Achievement of increased cardiorespiratory fitness and enhanced flexibility, muscular  endurance and strength shown through measurements of functional capacity and personal statement of participant.          Exercise Goals Re-Evaluation :    Discharge Exercise Prescription (Final Exercise Prescription Changes):   Nutrition:  Target Goals: Understanding of nutrition guidelines, daily intake of sodium 1500mg , cholesterol 200mg , calories 30% from fat and 7% or less from saturated fats, daily to have 5 or more servings of fruits and vegetables.  Biometrics:     Pre Biometrics - 02/02/17 1643      Pre Biometrics   Height 5' 7.25" (1.708 m)   Weight 281 lb 1.4 oz (127.5 kg)   Waist Circumference 52 inches   Hip Circumference 52.25 inches   Waist to Hip Ratio 1 %   BMI (Calculated) 43.8   Triceps Skinfold 34 mm   Grip Strength 34 kg   Flexibility 0 in       Nutrition Therapy Plan and Nutrition Goals:     Nutrition Therapy & Goals - 02/03/17 1154      Nutrition Therapy   Diet Carb Modified, Therapeutic Lifestyle Changes     Personal Nutrition Goals   Nutrition Goal Wt loss goal of 1-2 lb/week to a wt loss goal of 6-24 lb at graduation from [place] Goal #2 Improved glycemic control as evidenced by pt's A1c trending from 7.7 toward less than 7.0     Intervention Plan   Intervention Prescribe, educate and counsel regarding individualized specific dietary modifications aiming towards targeted core components such as weight, hypertension, lipid management, diabetes, heart failure and other comorbidities.   Expected Outcomes Short Term Goal: Understand basic principles of dietary content, such as calories, fat, sodium, cholesterol  and nutrients.;Long Term Goal: Adherence to prescribed nutrition plan.      Nutrition Discharge: Nutrition Scores:   Nutrition Goals Re-Evaluation:   Nutrition Goals Re-Evaluation:   Nutrition Goals Discharge (Final Nutrition Goals Re-Evaluation):   Psychosocial: Target Goals: Acknowledge presence or  absence of significant depression and/or stress, maximize coping skills, provide positive support system. Participant is able to verbalize types and ability to use techniques and skills needed for reducing stress and depression.  Initial Review & Psychosocial Screening:     Initial Psych Review & Screening - 02/09/17 1608      Initial Review   Current issues with Current Anxiety/Panic     Family Dynamics   Good Support System? Yes   Comments pt brother cares for pt.  Pt resides alone.     Barriers   Psychosocial barriers to participate in program The patient should benefit from training in stress management and relaxation.      Quality of Life Scores:   PHQ-9: Recent Review Flowsheet Data    Depression screen [facility] 2/9 02/20/2017 12/08/2015 12/07/2015 06/04/2015   Decreased Interest 1 0 1 0   Down, Depressed, Hopeless 0 0 1 0   PHQ - 2 Score 1 0 2 0   Altered sleeping - - 0 -   Tired, decreased energy - - 3 -   Change in appetite - - 1 -   Feeling bad or failure about yourself  - - 0 -   Trouble concentrating - - 1 -   Moving slowly or fidgety/restless - - 0 -   Suicidal thoughts - - 0 -   PHQ-9 Score - - 7 -   Difficult doing work/chores - - Somewhat difficult -     Interpretation of Total Score  Total Score Depression Severity:  1-4 = Minimal depression, 5-9 = Mild depression, 10-14 = Moderate depression, 15-19 = Moderately severe depression, 20-27 = Severe depression   Psychosocial Evaluation and Intervention:   Psychosocial Re-Evaluation:   Psychosocial Discharge (Final Psychosocial Re-Evaluation):   Vocational Rehabilitation: Provide vocational rehab assistance to qualifying candidates.   Vocational Rehab Evaluation & Intervention:     Vocational Rehab - 02/09/17 1610      Initial Vocational Rehab Evaluation & Intervention   Assessment shows need for Vocational Rehabilitation No      Education: Education Goals: Education classes will be provided on a  weekly basis, covering required topics. Participant will state understanding/return demonstration of topics presented.  Learning Barriers/Preferences:     Learning Barriers/Preferences - 02/02/17 0929      Learning Barriers/Preferences   Learning Barriers Sight   Learning Preferences Computer/Internet;Video;Pictoral      Education Topics: Count Your Pulse:  -Group instruction provided by verbal instruction, demonstration, patient participation and written materials to support subject.  Instructors address importance of being able to find your pulse and how to count your pulse when at home without a heart monitor.  Patients get hands on experience counting their pulse with staff help and individually.   Heart Attack, Angina, and Risk Factor Modification:  -Group instruction provided by verbal instruction, video, and written materials to support subject.  Instructors address signs and symptoms of angina and heart attacks.    Also discuss risk factors for heart disease and how to make changes to improve heart health risk factors.   Functional Fitness:  -Group instruction provided by verbal instruction, demonstration, patient participation, and written materials to support subject.  Instructors address safety measures for doing things around  the house.  Discuss how to get up and down off the floor, how to pick things up properly, how to safely get out of a chair without assistance, and balance training.   Meditation and Mindfulness:  -Group instruction provided by verbal instruction, patient participation, and written materials to support subject.  Instructor addresses importance of mindfulness and meditation practice to help reduce stress and improve awareness.  Instructor also leads participants through a meditation exercise.    Stretching for Flexibility and Mobility:  -Group instruction provided by verbal instruction, patient participation, and written materials to support subject.   Instructors lead participants through series of stretches that are designed to increase flexibility thus improving mobility.  These stretches are additional exercise for major muscle groups that are typically performed during regular warm up and cool down.   Hands Only CPR Anytime:  -Group instruction provided by verbal instruction, video, patient participation and written materials to support subject.  Instructors co-teach with AHA video for hands only CPR.  Participants get hands on experience with mannequins.   Nutrition I class: Heart Healthy Eating:  -Group instruction provided by PowerPoint slides, verbal discussion, and written materials to support subject matter. The instructor gives an explanation and review of the Therapeutic Lifestyle Changes diet recommendations, which includes a discussion on lipid goals, dietary fat, sodium, fiber, plant stanol/sterol esters, sugar, and the components of a well-balanced, healthy diet.   Nutrition II class: Lifestyle Skills:  -Group instruction provided by PowerPoint slides, verbal discussion, and written materials to support subject matter. The instructor gives an explanation and review of label reading, grocery shopping for heart health, heart healthy recipe modifications, and ways to make healthier choices when eating out.   Diabetes Question & Answer:  -Group instruction provided by PowerPoint slides, verbal discussion, and written materials to support subject matter. The instructor gives an explanation and review of diabetes co-morbidities, pre- and post-prandial blood glucose goals, pre-exercise blood glucose goals, signs, symptoms, and treatment of hypoglycemia and hyperglycemia, and foot care basics.   Diabetes Blitz:  -Group instruction provided by PowerPoint slides, verbal discussion, and written materials to support subject matter. The instructor gives an explanation and review of the physiology behind type 1 and type 2 diabetes, diabetes  medications and rational behind using different medications, pre- and post-prandial blood glucose recommendations and Hemoglobin A1c goals, diabetes diet, and exercise including blood glucose guidelines for exercising safely.    Portion Distortion:  -Group instruction provided by PowerPoint slides, verbal discussion, written materials, and food models to support subject matter. The instructor gives an explanation of serving size versus portion size, changes in portions sizes over the last 20 years, and what consists of a serving from each food group.   Stress Management:  -Group instruction provided by verbal instruction, video, and written materials to support subject matter.  Instructors review role of stress in heart disease and how to cope with stress positively.     Exercising on Your Own:  -Group instruction provided by verbal instruction, power point, and written materials to support subject.  Instructors discuss benefits of exercise, components of exercise, frequency and intensity of exercise, and end points for exercise.  Also discuss use of nitroglycerin and activating EMS.  Review options of places to exercise outside of rehab.  Review guidelines for sex with heart disease.   Cardiac Drugs I:  -Group instruction provided by verbal instruction and written materials to support subject.  Instructor reviews cardiac drug classes: antiplatelets, anticoagulants, beta blockers, and statins.  Instructor discusses reasons, side effects, and lifestyle considerations for each drug class.   Cardiac Drugs II:  -Group instruction provided by verbal instruction and written materials to support subject.  Instructor reviews cardiac drug classes: angiotensin converting enzyme inhibitors (ACE-I), angiotensin II receptor blockers (ARBs), nitrates, and calcium channel blockers.  Instructor discusses reasons, side effects, and lifestyle considerations for each drug class.   Anatomy and Physiology of the  Circulatory System:  -Group instruction provided by verbal instruction, video, and written materials to support subject.  Reviews functional anatomy of heart, how it relates to various diagnoses, and what role the heart plays in the overall system.   Knowledge Questionnaire Score:     Knowledge Questionnaire Score - 02/02/17 1646      Knowledge Questionnaire Score   Pre Score 20/24      Core Components/Risk Factors/Patient Goals at Admission:     Personal Goals and Risk Factors at Admission - 02/02/17 0934      Core Components/Risk Factors/Patient Goals on Admission    Weight Management Yes;Obesity;Weight Loss   Intervention Weight Management: Develop a combined nutrition and exercise program designed to reach desired caloric intake, while maintaining appropriate intake of nutrient and fiber, sodium and fats, and appropriate energy expenditure required for the weight goal.;Weight Management: Provide education and appropriate resources to help participant work on and attain dietary goals.;Obesity: Provide education and appropriate resources to help participant work on and attain dietary goals.;Weight Management/Obesity: Establish reasonable short term and long term weight goals.   Expected Outcomes Short Term: Continue to assess and modify interventions until short term weight is achieved;Weight Loss: Understanding of general recommendations for a balanced deficit meal plan, which promotes 1-2 lb weight loss per week and includes a negative energy balance of [phone] kcal/d;Understanding recommendations for meals to include 15-35% energy as protein, 25-35% energy from fat, 35-60% energy from carbohydrates, less than 200mg  of dietary cholesterol, 20-35 gm of total fiber daily;Understanding of distribution of calorie intake throughout the day with the consumption of 4-5 meals/snacks   Diabetes Yes   Intervention Provide education about signs/symptoms and action to take for  hypo/hyperglycemia.;Provide education about proper nutrition, including hydration, and aerobic/resistive exercise prescription along with prescribed medications to achieve blood glucose in normal ranges: Fasting glucose 65-99 mg/dL   Expected Outcomes Short Term: Participant verbalizes understanding of the signs/symptoms and immediate care of hyper/hypoglycemia, proper foot care and importance of medication, aerobic/resistive exercise and nutrition plan for blood glucose control.;Long Term: Attainment of HbA1C < 7%.   Hypertension Yes   Intervention Provide education on lifestyle modifcations including regular physical activity/exercise, weight management, moderate sodium restriction and increased consumption of fresh fruit, vegetables, and low fat dairy, alcohol moderation, and smoking cessation.;Monitor prescription use compliance.   Expected Outcomes Short Term: Continued assessment and intervention until BP is < 140/40mm HG in hypertensive participants. < 130/20mm HG in hypertensive participants with diabetes, heart failure or chronic kidney disease.;Long Term: Maintenance of blood pressure at goal levels.   Lipids Yes   Intervention Provide education and support for participant on nutrition & aerobic/resistive exercise along with prescribed medications to achieve LDL 70mg , HDL >40mg .   Expected Outcomes Short Term: Participant states understanding of desired cholesterol values and is compliant with medications prescribed. Participant is following exercise prescription and nutrition guidelines.;Long Term: Cholesterol controlled with medications as prescribed, with individualized exercise RX and with personalized nutrition plan. Value goals: LDL < 70mg , HDL > 40 mg.   Personal Goal Other Yes   Personal Goal  Learn nutrition and improve motivation to exercise and be more active.   Intervention Provide nutrition counsling and exercise programming to assist with weighloss goals, making dietary changes and  developing an exercise routine   Expected Outcomes Pt will build on aerobic activity, learn HH and diabetic diet and lose weight      Core Components/Risk Factors/Patient Goals Review:    Core Components/Risk Factors/Patient Goals at Discharge (Final Review):    ITP Comments:     ITP Comments    Row Name 02/02/17 0927           ITP Comments Medical Director, Dr. Fransico Him          Comments: Fenton completed a full day of exercise on Monday without difficulty.[name], RN,BSN 02/22/2017 6:56 PM

## 2017-02-23 ENCOUNTER — Encounter: Payer: Self-pay | Admitting: Physician Assistant

## 2017-02-23 ENCOUNTER — Ambulatory Visit (INDEPENDENT_AMBULATORY_CARE_PROVIDER_SITE_OTHER): Payer: Medicare Other | Admitting: Physician Assistant

## 2017-02-23 VITALS — BP 164/70 | HR 92 | Ht 68.0 in | Wt 265.8 lb

## 2017-02-23 DIAGNOSIS — I10 Essential (primary) hypertension: Secondary | ICD-10-CM

## 2017-02-23 DIAGNOSIS — I25110 Atherosclerotic heart disease of native coronary artery with unstable angina pectoris: Secondary | ICD-10-CM

## 2017-02-23 DIAGNOSIS — I5032 Chronic diastolic (congestive) heart failure: Secondary | ICD-10-CM | POA: Diagnosis not present

## 2017-02-23 DIAGNOSIS — Z951 Presence of aortocoronary bypass graft: Secondary | ICD-10-CM

## 2017-02-23 DIAGNOSIS — E785 Hyperlipidemia, unspecified: Secondary | ICD-10-CM

## 2017-02-23 DIAGNOSIS — G250 Essential tremor: Secondary | ICD-10-CM

## 2017-02-23 DIAGNOSIS — E118 Type 2 diabetes mellitus with unspecified complications: Secondary | ICD-10-CM | POA: Diagnosis not present

## 2017-02-23 LAB — BASIC METABOLIC PANEL
BUN / CREAT RATIO: 15 (ref 10–24)
BUN: 16 mg/dL (ref 8–27)
CHLORIDE: 100 mmol/L (ref 96–106)
CO2: 26 mmol/L (ref 18–29)
Calcium: 9.2 mg/dL (ref 8.6–10.2)
Creatinine, Ser: 1.05 mg/dL (ref 0.76–1.27)
GFR calc Af Amer: 80 mL/min/{1.73_m2} (ref >59)
GFR calc non Af Amer: 70 mL/min/{1.73_m2} (ref >59)
GLUCOSE: 157 mg/dL — AB (ref 65–99)
POTASSIUM: 4.1 mmol/L (ref 3.5–5.2)
SODIUM: 142 mmol/L (ref 134–144)

## 2017-02-23 MED ORDER — LOSARTAN POTASSIUM 25 MG PO TABS: 25.0000 mg | ORAL_TABLET | Freq: Every day | ORAL | 1 refills | Status: DC

## 2017-02-23 NOTE — Patient Instructions (Addendum)
Medication Instructions:  Your physician has recommended you make the following change in your medication: 1.  START Losartan 25 mg taking 1 tablet daily   Labwork: TODAY:  BMET 2 WEEKS:  BMET  Testing/Procedures: None ordered  Follow-Up: Your physician recommends that you schedule a follow-up appointment in: SEE DR. Tamala Julian AS PLANNED   Any Other Special Instructions Will Be Listed Below (If Applicable).   If you need a refill on your cardiac medications before your next appointment, please call your pharmacy.

## 2017-02-24 ENCOUNTER — Encounter (HOSPITAL_COMMUNITY): Payer: Medicare Other

## 2017-02-24 ENCOUNTER — Telehealth: Payer: Self-pay | Admitting: Interventional Cardiology

## 2017-02-24 NOTE — Telephone Encounter (Signed)
New Message  Pt voiced wanting Edward [name] to give him a call back

## 2017-02-24 NOTE — Telephone Encounter (Signed)
Returned pts call and discussed his lab results. Pt verbalized understanding.

## 2017-02-27 ENCOUNTER — Encounter (HOSPITAL_COMMUNITY)
Admission: RE | Admit: 2017-02-27 | Discharge: 2017-02-27 | Disposition: A | Discharge status: Home / Self Care | Payer: Medicare Other | Source: Ambulatory Visit | Attending: Interventional Cardiology | Admitting: Interventional Cardiology

## 2017-02-27 DIAGNOSIS — Z951 Presence of aortocoronary bypass graft: Secondary | ICD-10-CM | POA: Diagnosis not present

## 2017-02-27 DIAGNOSIS — J849 Interstitial pulmonary disease, unspecified: Secondary | ICD-10-CM

## 2017-02-27 DIAGNOSIS — N182 Chronic kidney disease, stage 2 (mild): Secondary | ICD-10-CM | POA: Diagnosis not present

## 2017-02-27 DIAGNOSIS — I2510 Atherosclerotic heart disease of native coronary artery without angina pectoris: Secondary | ICD-10-CM | POA: Diagnosis not present

## 2017-02-27 DIAGNOSIS — I5032 Chronic diastolic (congestive) heart failure: Secondary | ICD-10-CM | POA: Diagnosis not present

## 2017-02-27 DIAGNOSIS — I130 Hypertensive heart and chronic kidney disease with heart failure and stage 1 through stage 4 chronic kidney disease, or unspecified chronic kidney disease: Secondary | ICD-10-CM | POA: Diagnosis not present

## 2017-02-27 LAB — GLUCOSE, CAPILLARY
GLUCOSE-CAPILLARY: 150 mg/dL — AB (ref 65–99)
Glucose-Capillary: 111 mg/dL — ABNORMAL HIGH (ref 65–99)
Glucose-Capillary: 65 mg/dL (ref 65–99)

## 2017-02-27 NOTE — Progress Notes (Signed)
Today pt pre exercise blood glucose 151 after 30 minutes of exercise pt blood glucose dropped to 65. Pt given lemonade, banana and a snack cake he had leftover from breakfast. Pt ate oatmeal this morning for breakfast and purchased a snack cake from the cafeteria to eat. Pt rechecked 111. Will need adjustment made for days of exercise for reduction in the amount of insulin he takes for his morning dose. Pt takes Novolog insulin 40 units and takes Levimer 70 units every evening.  Request sent to Dr Cruzita Lederer for review. Await response. Cherre Huger, BSN Cardiac and Training and development officer

## 2017-02-28 ENCOUNTER — Other Ambulatory Visit: Payer: Self-pay

## 2017-02-28 ENCOUNTER — Telehealth (HOSPITAL_COMMUNITY): Payer: Self-pay | Admitting: *Deleted

## 2017-02-28 NOTE — Telephone Encounter (Signed)
Called and LVM advising patient how to take the insulin when he has his cardiac rehab. Patient was advised to call back to make sure he understood, given call back number.

## 2017-02-28 NOTE — Telephone Encounter (Signed)
Message left for pt to please contact cardiac rehab regarding insulin amount on days of exercise.  Contact information provided. Cherre Huger, BSN Cardiac and Training and development officer

## 2017-02-28 NOTE — Telephone Encounter (Signed)
-----   Message from Philemon Kingdom, MD sent at 02/28/2017  7:27 AM EDT ----- Regarding: RE: Advisement of insulin dose on exercise days [place], Thank you for the update. I will ask him to only take 30 units with the meal before exercise. [name]  ----- Message ----- From: Rowe Pavy, RN Sent: 02/27/2017  11:21 AM To: Philemon Kingdom, MD Subject: Advisement of insulin dose on exercise days    Dr. Cruzita Lederer,  The above pt started  cardiac rehab last week twice a week on Mondays and Fridays.  Today pt pre exercise blood glucose 151 after 30 minutes of exercise pt blood glucose dropped to 65.  Pt given lemonade, banana and a snack cake he had leftover from breakfast.  Pt rechecked 111.  On 4/23 pre exercise - 184 and post 124.  Pt takes 40 units of novolog with each meal.  Pt takes levimer 70 units each evening.  For breakfast today he had oatmeal and snack cake. Will need adjustment made for days of exercise for reduction in the amount of insulin he takes for his morning dose. Pt feels he needs to take less but is unsure of how much.  We appreciate your input. Cherre Huger, BSN Cardiac and Training and development officer ;

## 2017-03-02 ENCOUNTER — Ambulatory Visit (INDEPENDENT_AMBULATORY_CARE_PROVIDER_SITE_OTHER): Payer: Medicare Other | Admitting: Physician Assistant

## 2017-03-02 ENCOUNTER — Encounter: Payer: Self-pay | Admitting: Physician Assistant

## 2017-03-02 ENCOUNTER — Telehealth: Payer: Self-pay | Admitting: *Deleted

## 2017-03-02 VITALS — BP 120/60 | HR 72 | Ht 67.25 in | Wt 271.2 lb

## 2017-03-02 DIAGNOSIS — Z86010 Personal history of colonic polyps: Secondary | ICD-10-CM

## 2017-03-02 DIAGNOSIS — I25110 Atherosclerotic heart disease of native coronary artery with unstable angina pectoris: Secondary | ICD-10-CM

## 2017-03-02 DIAGNOSIS — Z7901 Long term (current) use of anticoagulants: Secondary | ICD-10-CM

## 2017-03-02 DIAGNOSIS — R1013 Epigastric pain: Secondary | ICD-10-CM | POA: Diagnosis not present

## 2017-03-02 DIAGNOSIS — Z1211 Encounter for screening for malignant neoplasm of colon: Secondary | ICD-10-CM | POA: Diagnosis not present

## 2017-03-02 DIAGNOSIS — K219 Gastro-esophageal reflux disease without esophagitis: Secondary | ICD-10-CM | POA: Diagnosis not present

## 2017-03-02 MED ORDER — RANITIDINE HCL 150 MG PO TABS: 150.0000 mg | ORAL_TABLET | Freq: Every day | ORAL | 11 refills | Status: DC

## 2017-03-02 MED ORDER — NA SULFATE-K SULFATE-MG SULF 17.5-3.13-1.6 GM/177ML PO SOLN: 1.0000 | Freq: Once | ORAL | 0 refills | Status: AC

## 2017-03-02 NOTE — Telephone Encounter (Signed)
03/02/2017   RE: NASIRE REALI DOB: 1941/06/14 MRN: [id number]   Dear Dr. Daneen Schick,    We have scheduled the above patient for an endoscopic procedure. Our records show that he is on anticoagulation therapy.   Please advise as to how long the patient may come off his therapy of Plavix prior to the procedure, which is scheduled for 04-25-2017.  Please route the completed form to [place] .   Sincerely,    Amy Esterwood PA-C

## 2017-03-02 NOTE — Progress Notes (Signed)
Subjective:    Patient ID: Edward [name], male    DOB: [birth year], 76 y.o.   MRN: [id number]  HPI Edward [name] is a very nice 76 year old African-American male known to Dr. Fuller Plan. He has history of hypertension, sleep apnea, adult-onset diabetes mellitus, morbid obesity, and coronary artery disease. He is status post stent placement in 2016 and has been maintained on aspirin and Plavix. He underwent CABG 2 in January 2018.  Patient last seen in our office in September 2017. He does have history of chronic GERD-not currently on any medication. Also with history of IBS, diverticulosis and adenomatous colon polyps. Last colonoscopy was done in 2012 elsewhere. We do have a copy of that report, and patient had 1 adenomatous colon polyp. He was to have 5 year interval follow-up but that had been delayed because of stent placement. He comes in today to discuss follow-up colonoscopy and EGD and also relates that he had been having loose stools over the past couple of months some GI upset. He says that Dr. Pernell Dupre took him off of several medications about a month ago and the loose stools have resolved. He is having bowel movements daily but says sometimes his stools are actually hard at this point. He also had been taking some Tylenol and occasional Aleve because he is going to cardiac rehabilitation. He feels that Tylenol upsets his stomach as well. He has occasional postprandial urgency primarily with fatty foods or fast foods. No current complaints of dysphagia or odynophagia. He has not noticed any bleeding.  Review of Systems Pertinent positive and negative review of systems were noted in the above HPI section.  All other review of systems was otherwise negative.  Outpatient Encounter Prescriptions as of 03/02/2017  Medication Sig  . acetaminophen (TYLENOL) 500 MG tablet Take 2 tablets (1,000 mg total) by mouth every 6 (six) hours as needed.  Marland Kitchen amLODipine (NORVASC) 10 MG tablet take 1 tablet by mouth once  daily  . aspirin EC 81 MG tablet Take 81 mg by mouth daily.  Marland Kitchen atorvastatin (LIPITOR) 80 MG tablet Take 80 mg by mouth daily.  . clonazePAM (KLONOPIN) 0.5 MG tablet Take 1 tablet (0.5 mg total) by mouth 2 (two) times daily as needed (tremors).  . clopidogrel (PLAVIX) 75 MG tablet Take 1 tablet (75 mg total) by mouth daily.  . diclofenac sodium (VOLTAREN) 1 % GEL Apply 2 g topically 4 (four) times daily as needed (for pain).   . furosemide (LASIX) 40 MG tablet take 1 tablet by mouth once daily  . gabapentin (NEURONTIN) 300 MG capsule Take 1 capsule (300 mg total) by mouth 2 (two) times daily.  . insulin aspart (NOVOLOG) 100 UNIT/ML injection Inject 30-40 units 3 times daily with meals. (Patient taking differently: Inject 40 Units into the skin 3 (three) times daily with meals. )  . insulin detemir (LEVEMIR) 100 UNIT/ML injection Inject 0.45 mLs (45 Units total) into the skin 2 (two) times daily. (Patient taking differently: Inject 70 Units into the skin at bedtime. )  . losartan (COZAAR) 25 MG tablet Take 1 tablet (25 mg total) by mouth daily.  . Polyvinyl Alcohol-Povidone (REFRESH OP) Place 1 drop into both eyes daily as needed (for dry eyes). Reported on 12/25/2015  . potassium chloride SA (K-DUR,KLOR-CON) 20 MEQ tablet take 1 tablet by mouth once daily  . propranolol (INDERAL) 40 MG tablet Take 40 mg by mouth 3 (three) times daily.  . VENTOLIN HFA 108 (90 BASE) MCG/ACT inhaler Inhale 2  puffs into the lungs every 6 (six) hours as needed for wheezing or shortness of breath.   . Na Sulfate-K Sulfate-Mg Sulf 17.5-3.13-1.6 GM/180ML SOLN Take 1 kit by mouth once.  . ranitidine (ZANTAC) 150 MG tablet Take 1 tablet (150 mg total) by mouth at bedtime.   No facility-administered encounter medications on file as of 03/02/2017.    Allergies  Allergen Reactions  . Pneumococcal Vaccines Anaphylaxis  . No Known Allergies    Patient Active Problem List   Diagnosis Date Noted  . Shoulder pain 02/05/2017  .  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 Hypertensive heart disease without heart failure   . Chronic kidney disease, stage III (moderate) 11/03/2016  . Chronic diastolic HF (heart failure) ([place])   . Flank pain, acute 07/06/2016  . Dizziness   . Hypokalemia 04/19/2016  . Chest pain 04/19/2016  . Near syncope 04/19/2016  . Lightheaded 04/19/2016  . Diabetes mellitus ([place]) 03/11/2016  . Atherosclerosis of native coronary artery of native heart with unstable angina pectoris ([place])   . Wheezing 11/30/2015  . GERD (gastroesophageal reflux disease) 08/27/2015  . Acute bronchitis 08/27/2015  . Dyspnea 07/08/2015  . OSA (obstructive sleep apnea) 06/22/2015  . Essential tremor 04/23/2015  . Rotator cuff arthropathy 07/30/2014  . CAP (community acquired pneumonia) 04/16/2014  . Severe obesity (BMI >= 40) ([place]) 02/11/2014  . Hyperlipidemia LDL goal <70 01/29/2014  . Essential hypertension 01/29/2014   Social History   Social History  . Marital status: Married    Spouse name: N/A  . Number of children: 0  . Years of education: N/A   Occupational History  . retired    Social History Main Topics  . Smoking status: Never Smoker  . Smokeless tobacco: Never Used  . Alcohol use No  . Drug use: No  . Sexual activity: No   Other Topics Concern  . 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   He lives alone.  His wife is living in [place] at this time.  They do not have children.   Highest level of education:  [employer] graduate    Mr. Adkins's family history includes Alzheimer's disease in his sister; Asthma in his brother; CAD in his brother and sister; Diabetes in his other; Prostate cancer in his brother.      Objective:    Vitals:   03/02/17 1019  BP: 120/60  Pulse: 72    Physical Exam  well-developed older African-American male in no acute distress, very pleasant blood pressure 120/60, pulse  72, height 5 foot 7, weight 271, BMI 42.1. HEENT; nontraumatic normocephalic EOMI PERRLA sclerae anicteric, Cardiovascular; regular rate and rhythm with S1-S2 no murmur rub or gallop, large sternal incisional scar well-healed, Pulmonary; clear bilaterally, Abdomen ;obese soft he has some mild tenderness in the left mid left lower quadrant there is no guarding or rebound no palpable mass or hepatosplenomegaly bowel sounds are active, Rectal ;exam not done, Extremities; no clubbing cyanosis or edema skin warm and dry, Neuropsych; mood and affect appropriate       Assessment & Plan:   #15 76 year old African-American male with history of chronic GERD-rule out Barrett's #2 history of adenomatous colon polyps-last colonoscopy 2012 (done elsewhere)-overdue for follow-up which was delayed due to cardiac complications #3 IBS #4 diverticulosis #5 coronary artery disease status post stents 2016, and CABG times 11/01/2016 #6 chronic  antiplatelet therapy-on aspirin and Plavix #7 adult-onset diabetes mellitus #8 sleep apnea and no oxygen use #9 hypertension #10 morbid obesity BMI 42  Plan; Patient has had some mild IBS-type symptoms, hesitant to use an antispasmodic due to history of BPH. Stools are currently formed. We will start Zantac 150 mg by mouth daily at bedtime Will schedule for EGD and colonoscopy with Dr. Fuller Plan. Both procedures discussed in detail with patient including risks and benefits and he is agreeable to proceed. Patient will need to hold Plavix for 5 days prior to the procedures. We will communicate with his cardiologist Dr. Daneen Schick to assure that this is reasonable for this patient.   Burke Terry S Andyn Sales PA-C 03/02/2017   Cc: Carollee Herter, Alferd Apa, *

## 2017-03-02 NOTE — Progress Notes (Signed)
Reviewed and agree with management plan.  Riniyah Speich T. Declynn Lopresti, MD FACG 

## 2017-03-02 NOTE — Patient Instructions (Addendum)
We have sent the following medications to your pharmacy for you to pick up at your convenience: 1. Suprep for the colonoscopy 2. Zantac 150 mg  You have been scheduled for a colonoscopy. Please follow written instructions given to you at your visit today.  Please pick up your prep supplies at the pharmacy within the next 1-3 days. [place]  If you use inhalers (even only as needed), please bring them with you on the day of your procedure. Your physician has requested that you go to www.startemmi.com and enter the access code given to you at your visit today. This web site gives a general overview about your procedure. However, you should still follow specific instructions given to you by our office regarding your preparation for the procedure.

## 2017-03-03 ENCOUNTER — Encounter (HOSPITAL_COMMUNITY)
Admission: RE | Admit: 2017-03-03 | Discharge: 2017-03-03 | Disposition: A | Discharge status: Home / Self Care | Payer: Medicare Other | Source: Ambulatory Visit | Attending: Interventional Cardiology | Admitting: Interventional Cardiology

## 2017-03-03 DIAGNOSIS — Z951 Presence of aortocoronary bypass graft: Secondary | ICD-10-CM | POA: Insufficient documentation

## 2017-03-03 DIAGNOSIS — J849 Interstitial pulmonary disease, unspecified: Secondary | ICD-10-CM | POA: Diagnosis not present

## 2017-03-03 DIAGNOSIS — K219 Gastro-esophageal reflux disease without esophagitis: Secondary | ICD-10-CM | POA: Diagnosis not present

## 2017-03-03 DIAGNOSIS — I5032 Chronic diastolic (congestive) heart failure: Secondary | ICD-10-CM | POA: Insufficient documentation

## 2017-03-03 DIAGNOSIS — Z7982 Long term (current) use of aspirin: Secondary | ICD-10-CM | POA: Diagnosis not present

## 2017-03-03 DIAGNOSIS — G250 Essential tremor: Secondary | ICD-10-CM | POA: Diagnosis not present

## 2017-03-03 DIAGNOSIS — N182 Chronic kidney disease, stage 2 (mild): Secondary | ICD-10-CM | POA: Diagnosis not present

## 2017-03-03 DIAGNOSIS — Z8673 Personal history of transient ischemic attack (TIA), and cerebral infarction without residual deficits: Secondary | ICD-10-CM | POA: Insufficient documentation

## 2017-03-03 DIAGNOSIS — I2510 Atherosclerotic heart disease of native coronary artery without angina pectoris: Secondary | ICD-10-CM | POA: Diagnosis not present

## 2017-03-03 DIAGNOSIS — Z79899 Other long term (current) drug therapy: Secondary | ICD-10-CM | POA: Diagnosis not present

## 2017-03-03 DIAGNOSIS — G4730 Sleep apnea, unspecified: Secondary | ICD-10-CM | POA: Diagnosis not present

## 2017-03-03 DIAGNOSIS — E1122 Type 2 diabetes mellitus with diabetic chronic kidney disease: Secondary | ICD-10-CM | POA: Diagnosis not present

## 2017-03-03 DIAGNOSIS — Z7902 Long term (current) use of antithrombotics/antiplatelets: Secondary | ICD-10-CM | POA: Diagnosis not present

## 2017-03-03 DIAGNOSIS — E785 Hyperlipidemia, unspecified: Secondary | ICD-10-CM | POA: Insufficient documentation

## 2017-03-03 DIAGNOSIS — I130 Hypertensive heart and chronic kidney disease with heart failure and stage 1 through stage 4 chronic kidney disease, or unspecified chronic kidney disease: Secondary | ICD-10-CM | POA: Diagnosis not present

## 2017-03-03 DIAGNOSIS — Z794 Long term (current) use of insulin: Secondary | ICD-10-CM | POA: Insufficient documentation

## 2017-03-03 LAB — GLUCOSE, CAPILLARY
GLUCOSE-CAPILLARY: 170 mg/dL — AB (ref 65–99)
Glucose-Capillary: 161 mg/dL — ABNORMAL HIGH (ref 65–99)

## 2017-03-03 NOTE — Telephone Encounter (Signed)
It would be okay to hold antiplatelet therapy for 3-5 days prior to endoscopy.

## 2017-03-06 ENCOUNTER — Encounter (HOSPITAL_COMMUNITY)
Admission: RE | Admit: 2017-03-06 | Discharge: 2017-03-06 | Disposition: A | Discharge status: Home / Self Care | Payer: Medicare Other | Source: Ambulatory Visit | Attending: Interventional Cardiology | Admitting: Interventional Cardiology

## 2017-03-06 DIAGNOSIS — J849 Interstitial pulmonary disease, unspecified: Secondary | ICD-10-CM | POA: Diagnosis not present

## 2017-03-06 DIAGNOSIS — N182 Chronic kidney disease, stage 2 (mild): Secondary | ICD-10-CM | POA: Diagnosis not present

## 2017-03-06 DIAGNOSIS — Z951 Presence of aortocoronary bypass graft: Secondary | ICD-10-CM | POA: Diagnosis not present

## 2017-03-06 DIAGNOSIS — I2510 Atherosclerotic heart disease of native coronary artery without angina pectoris: Secondary | ICD-10-CM | POA: Diagnosis not present

## 2017-03-06 DIAGNOSIS — I130 Hypertensive heart and chronic kidney disease with heart failure and stage 1 through stage 4 chronic kidney disease, or unspecified chronic kidney disease: Secondary | ICD-10-CM | POA: Diagnosis not present

## 2017-03-06 DIAGNOSIS — I5032 Chronic diastolic (congestive) heart failure: Secondary | ICD-10-CM | POA: Diagnosis not present

## 2017-03-06 LAB — GLUCOSE, CAPILLARY
GLUCOSE-CAPILLARY: 115 mg/dL — AB (ref 65–99)
GLUCOSE-CAPILLARY: 117 mg/dL — AB (ref 65–99)

## 2017-03-08 NOTE — Telephone Encounter (Signed)
LM for the patient to please call me regarding the Plavix instructions for his colonoscopy.

## 2017-03-08 NOTE — Telephone Encounter (Signed)
The patient called me back and I advised him to stop his Plavix on 6-21 and resume it on 04-26-2017. The patient verbalized understanding Plavix clearance instructions.

## 2017-03-09 ENCOUNTER — Ambulatory Visit: Payer: Medicare Other | Admitting: Neurology

## 2017-03-09 ENCOUNTER — Other Ambulatory Visit: Payer: Medicare Other

## 2017-03-10 ENCOUNTER — Telehealth: Payer: Self-pay | Admitting: Gastroenterology

## 2017-03-10 ENCOUNTER — Encounter: Payer: Self-pay | Admitting: Gastroenterology

## 2017-03-10 ENCOUNTER — Encounter (HOSPITAL_COMMUNITY)
Admission: RE | Admit: 2017-03-10 | Discharge: 2017-03-10 | Disposition: A | Discharge status: Home / Self Care | Payer: Medicare Other | Source: Ambulatory Visit | Attending: Interventional Cardiology | Admitting: Interventional Cardiology

## 2017-03-10 DIAGNOSIS — I5032 Chronic diastolic (congestive) heart failure: Secondary | ICD-10-CM | POA: Diagnosis not present

## 2017-03-10 DIAGNOSIS — I130 Hypertensive heart and chronic kidney disease with heart failure and stage 1 through stage 4 chronic kidney disease, or unspecified chronic kidney disease: Secondary | ICD-10-CM | POA: Diagnosis not present

## 2017-03-10 DIAGNOSIS — J849 Interstitial pulmonary disease, unspecified: Secondary | ICD-10-CM | POA: Diagnosis not present

## 2017-03-10 DIAGNOSIS — I2510 Atherosclerotic heart disease of native coronary artery without angina pectoris: Secondary | ICD-10-CM | POA: Diagnosis not present

## 2017-03-10 DIAGNOSIS — Z951 Presence of aortocoronary bypass graft: Secondary | ICD-10-CM | POA: Diagnosis not present

## 2017-03-10 DIAGNOSIS — N182 Chronic kidney disease, stage 2 (mild): Secondary | ICD-10-CM | POA: Diagnosis not present

## 2017-03-10 LAB — GLUCOSE, CAPILLARY: GLUCOSE-CAPILLARY: 112 mg/dL — AB (ref 65–99)

## 2017-03-10 NOTE — Telephone Encounter (Signed)
Patient instructed on bland diet. He will give [place] a progress report in a couple days if he has not improved.

## 2017-03-10 NOTE — Progress Notes (Signed)
Incomplete Session Note  Patient Details  Name: Edward [name] MRN: [id number] Date of Birth: Feb 15, 1941 Referring Provider:     [place] from 02/09/2017 in [place]  Referring Provider  Daneen Schick MD      [name] did not complete his rehab session.  Silver reports that he has been having diarrhea for the past few days. We advised [name] not to exercise today. [name] will return to exercise when he is feeling better.[name], RN,BSN 03/10/2017 11:05 AM

## 2017-03-10 NOTE — Telephone Encounter (Signed)
Bland diet until feeling better-try Imodium multi symptom relief OTC 1-2  Twice daily

## 2017-03-10 NOTE — Telephone Encounter (Signed)
Patient complains of abdominal ache, frequent loose stools. Not watery diarrhea. Some urgency. Denies bloody stool, nausea or fevers. Pepto-Bismol has not helped. He is eating solid foods. Please advise.

## 2017-03-10 NOTE — Progress Notes (Signed)
Nutrition Note Spoke with pt. Pre-exercise CBG 112 mg/dL. Pt states fasting CBG 124 mg/dL. Pt states he took 25 units of Novolog before breakfast and ate oatmeal, bacon, and cornbeef hash. Pt given lemonade before exercise. Discussed pre-exercise medication and diet. Continue client-centered nutrition education by RD as part of interdisciplinary care.  Monitor and evaluate progress toward nutrition goal with team.  Derek Mound, M.Ed, RD, LDN, CDE 03/10/2017 10:05 AM

## 2017-03-13 ENCOUNTER — Other Ambulatory Visit: Payer: Medicare Other

## 2017-03-13 ENCOUNTER — Telehealth (HOSPITAL_COMMUNITY): Payer: Self-pay | Admitting: Family Medicine

## 2017-03-13 ENCOUNTER — Encounter (HOSPITAL_COMMUNITY): Payer: Medicare Other

## 2017-03-17 ENCOUNTER — Encounter (HOSPITAL_COMMUNITY): Payer: Medicare Other

## 2017-03-20 ENCOUNTER — Other Ambulatory Visit (INDEPENDENT_AMBULATORY_CARE_PROVIDER_SITE_OTHER): Payer: Medicare Other

## 2017-03-20 ENCOUNTER — Encounter (HOSPITAL_COMMUNITY): Payer: Medicare Other

## 2017-03-20 ENCOUNTER — Other Ambulatory Visit: Payer: Self-pay

## 2017-03-20 ENCOUNTER — Telehealth: Payer: Self-pay | Admitting: Physician Assistant

## 2017-03-20 DIAGNOSIS — R197 Diarrhea, unspecified: Secondary | ICD-10-CM

## 2017-03-20 DIAGNOSIS — R1030 Lower abdominal pain, unspecified: Secondary | ICD-10-CM | POA: Diagnosis not present

## 2017-03-20 LAB — BASIC METABOLIC PANEL
BUN: 13 mg/dL (ref 6–23)
CALCIUM: 9 mg/dL (ref 8.4–10.5)
CO2: 30 mEq/L (ref 19–32)
Chloride: 101 mEq/L (ref 96–112)
Creatinine, Ser: 1.07 mg/dL (ref 0.40–1.50)
GFR: 86.69 mL/min (ref >60.00)
Glucose, Bld: 285 mg/dL — ABNORMAL HIGH (ref 70–99)
POTASSIUM: 4 meq/L (ref 3.5–5.1)
Sodium: 136 mEq/L (ref 135–145)

## 2017-03-20 LAB — CBC WITH DIFFERENTIAL/PLATELET
BASOS ABS: 0.1 10*3/uL (ref 0.0–0.1)
Basophils Relative: 0.8 % (ref 0.0–3.0)
EOS ABS: 0.2 10*3/uL (ref 0.0–0.7)
EOS PCT: 3.2 % (ref 0.0–5.0)
HEMATOCRIT: 40.6 % (ref 39.0–52.0)
HEMOGLOBIN: 13.4 g/dL (ref 13.0–17.0)
Lymphocytes Relative: 21.4 % (ref 12.0–46.0)
Lymphs Abs: 1.7 10*3/uL (ref 0.7–4.0)
MCHC: 32.9 g/dL (ref 30.0–36.0)
MCV: 84.1 fl (ref 78.0–100.0)
MONO ABS: 0.7 10*3/uL (ref 0.1–1.0)
Monocytes Relative: 9 % (ref 3.0–12.0)
Neutro Abs: 5.1 10*3/uL (ref 1.4–7.7)
Neutrophils Relative %: 65.6 % (ref 43.0–77.0)
PLATELETS: 261 10*3/uL (ref 150.0–400.0)
RBC: 4.83 Mil/uL (ref 4.22–5.81)
RDW: 15 % (ref 11.5–15.5)
WBC: 7.8 10*3/uL (ref 4.0–10.5)

## 2017-03-20 NOTE — Telephone Encounter (Signed)
Left a message about the Imodium. He already knew about getting the labs and stool test.

## 2017-03-20 NOTE — Addendum Note (Signed)
Encounter addended by: Meta Hatchet on: 03/20/2017  4:37 PM<BR>    Actions taken: Visit Navigator Flowsheet section accepted

## 2017-03-20 NOTE — Telephone Encounter (Signed)
Patient was seen 03/02/17. He called last week because he had been having diarrhea. He has tried bland diet and he is now on only liquids. His calls today with complaints of continuing diarrhea with a foul smell and his "stomach hurts like someone is in there". He wants to be seen. First available is 03/22/17. Put on schedule. Can he start labs today.

## 2017-03-20 NOTE — Telephone Encounter (Signed)
Discussed with Beth-will do cdiff pcr and check cbc and BMET, pt to start Imodium multi symptom  up to 6 per day in interim

## 2017-03-21 ENCOUNTER — Encounter (HOSPITAL_COMMUNITY): Payer: Self-pay | Admitting: *Deleted

## 2017-03-21 ENCOUNTER — Ambulatory Visit: Payer: Medicare Other | Admitting: Physician Assistant

## 2017-03-21 DIAGNOSIS — Z951 Presence of aortocoronary bypass graft: Secondary | ICD-10-CM

## 2017-03-21 LAB — CLOSTRIDIUM DIFFICILE BY PCR: CDIFFPCR: NEGATIVE

## 2017-03-21 NOTE — Progress Notes (Signed)
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  acetaminophen (TYLENOL) 500 MG tablet, Take 2 tablets (1,000 mg total) by mouth every 6 (six) hours as needed., Disp: 30 tablet, Rfl: 0 .  amLODipine (NORVASC) 10 MG tablet, take 1 tablet by mouth once daily, Disp: 30 tablet, Rfl: 10 .  aspirin EC 81 MG tablet, Take 81 mg by mouth daily., Disp: , Rfl:  .  atorvastatin (LIPITOR) 80 MG tablet, Take 80 mg by mouth daily., Disp: , Rfl:  .  clonazePAM (KLONOPIN) 0.5 MG tablet, Take 1 tablet (0.5 mg total) by mouth 2 (two) times daily as needed (tremors)., Disp: 45 tablet, Rfl: 3 .  clopidogrel (PLAVIX) 75 MG tablet, Take 1 tablet (75 mg total) by mouth daily., Disp: 90 tablet, Rfl: 3 .  diclofenac sodium (VOLTAREN) 1 % GEL, Apply 2 g topically 4 (four) times daily as needed (for pain). , Disp: , Rfl:  .  furosemide (LASIX) 40 MG tablet, take 1 tablet by mouth once daily, Disp: 90 tablet, Rfl: 1 .  gabapentin (NEURONTIN) 300 MG capsule, Take 1 capsule (300 mg total) by mouth 2 (two) times daily., Disp: , Rfl:  .  insulin aspart (NOVOLOG) 100 UNIT/ML injection, Inject 30-40 units 3 times daily with meals. (Patient taking differently: Inject 40 Units into the skin 3 (three) times daily with meals. ), Disp: 30 mL, Rfl: 11 .  insulin detemir (LEVEMIR) 100 UNIT/ML injection, Inject 0.45 mLs (45 Units total) into the skin 2 (two) times daily. (Patient taking differently: Inject 70  Units into the skin at bedtime. ), Disp: 30 mL, Rfl: 11 .  losartan (COZAAR) 25 MG tablet, Take 1 tablet (25 mg total) by mouth daily., Disp: 30 tablet, Rfl: 1 .  Polyvinyl Alcohol-Povidone (REFRESH OP), Place 1 drop into both eyes daily as needed (for dry eyes). Reported on 12/25/2015, Disp: , Rfl:  .  potassium chloride SA (K-DUR,KLOR-CON) 20 MEQ tablet, take 1 tablet by mouth once daily, Disp: 30 tablet, Rfl: 11 .  propranolol (INDERAL) 40 MG tablet, Take 40 mg by mouth 3 (three) times daily., Disp: , Rfl: 0 .  ranitidine (ZANTAC) 150 MG tablet, Take 1 tablet (150 mg total) by mouth at bedtime., Disp: 30 tablet, Rfl: 11 .  VENTOLIN HFA 108 (90 BASE) MCG/ACT inhaler, Inhale 2 puffs into the lungs every 6 (six) hours as needed for wheezing or shortness of breath. , Disp: , Rfl: 0  Past Medical History: Past Medical History:  Diagnosis Date  . Adenomatous colon polyp 04/2011  . CAD (coronary artery disease)    a. 01/2015 DES to LAD  b. 12/2015: [place] 85% oLCx lesion--> rx therapy.  . Chronic diastolic CHF (congestive heart failure) ([place])   . CKD (chronic kidney disease), stage II    a. probable CKD II-III with baseline CR 1.1-1.3.  . DM type 2 (diabetes mellitus, type 2), insulin dependent 01/29/2014   fasting cbg 50-120  with new regimen  . Dyslipidemia, goal LDL below 70 01/29/2014  . Enlarged prostate   . Essential tremor    a. on proprnolol  . GERD (gastroesophageal reflux disease)   . Hypertension   . Internal hemorrhoid   . Sleep apnea    does not use cpap (06/04/2015)  . TIA (transient ischemic attack) 2002    Tobacco Use: History  Smoking Status  . Never Smoker  Smokeless Tobacco  . Never Used    Labs: Recent Review Flowsheet Data    Labs for ITP Cardiac and Pulmonary Rehab Latest Ref Rng & Units 11/09/2016 11/10/2016 12/16/2016 01/02/2017 02/06/2017   Cholestrol 0 - 200 mg/dL - - - 143 166   LDLCALC 0 - 99 mg/dL - - - 88 107(H)   LDLDIRECT mg/dL - - - - -   HDL >40 mg/dL - - -  37.70(L) 30(L)   Trlycerides <150 mg/dL - - - 87.0 146   Hemoglobin A1c - - - 7.7  - -   PHART 7.350 - 7.450 7.341(L) - - - -   PCO2ART 32.0 - 48.0 mmHg 45.1 - - - -   HCO3 20.0 - 28.0 mmol/L 24.2 - - - -   TCO2 0 - 100 mmol/L 26 24 - - -   ACIDBASEDEF 0.0 - 2.0 mmol/L 2.0 - - - -   O2SAT % 97.0 - - - -      Capillary Blood Glucose: Lab Results  Component Value Date   GLUCAP 112 (H) 03/10/2017   GLUCAP 117 (H) 03/06/2017   GLUCAP 115 (H) 03/06/2017   GLUCAP 161 (H) 03/03/2017   GLUCAP 170 (H) 03/03/2017     Exercise Target Goals:    Exercise Program Goal: Individual exercise prescription set with THRR, safety & activity barriers. Participant demonstrates ability to understand and report RPE using BORG scale, to self-measure pulse accurately, and to acknowledge the importance of the exercise prescription.  Exercise Prescription Goal: Starting with aerobic activity 30 plus minutes a day, 3 days per week for initial exercise prescription. Provide home exercise prescription and guidelines that participant acknowledges understanding prior to discharge.  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 HR 120 bpm     Max Ex. BP 194/70     2 Minute Post BP 128/72        Oxygen Initial Assessment:   Oxygen Re-Evaluation:   Oxygen Discharge (Final Oxygen  Re-Evaluation):   Initial Exercise Prescription:     Initial Exercise Prescription - 02/09/17 1200      Date of Initial Exercise RX  and Referring Provider   Date 02/09/17   Referring Provider Daneen Schick MD     NuStep   Level 2   Minutes 20   METs 2.1     Track   Laps 8   Minutes 10   METs 2.4     Prescription Details   Frequency (times per week) 5   Duration Progress to 45 minutes of aerobic exercise without signs/symptoms of physical distress     Intensity   THRR 40-80% of Max Heartrate 58-117   Ratings of Perceived Exertion 11-13   Perceived Dyspnea 0-4     Progression   Progression Continue to progress workloads to maintain intensity without signs/symptoms of physical distress.     Resistance Training   Training Prescription Yes   Weight 2   Reps 10-15      Perform Capillary Blood Glucose checks as needed.  Exercise Prescription Changes:     Exercise Prescription Changes    Row Name 03/08/17 1700             Response to Exercise   Blood Pressure (Admit) 152/74       Blood Pressure (Exercise) 144/68       Blood Pressure (Exit) 142/64       Heart Rate (Admit) 79 bpm       Heart Rate (Exercise) 102 bpm       Heart Rate (Exit) 68 bpm       Rating of Perceived Exertion (Exercise) 13       Duration Progress to 45 minutes of aerobic exercise without signs/symptoms of physical distress       Intensity THRR unchanged         Progression   Progression Continue to progress workloads to maintain intensity without signs/symptoms of physical distress.       Average METs 2.5         Resistance Training   Training Prescription Yes       Weight 2       Reps 10-15         NuStep   Level 2       Minutes 20       METs 2.6         Track   Laps 8       Minutes 10       METs 2.4          Exercise Comments:     Exercise Comments    Row Name 03/20/17 1636           Exercise Comments Pt has been absent from [facility] for the past couple of week due to  traveling for work.  He hopes to return next week.           Exercise Goals and Review:     Exercise Goals    Row Name 02/02/17 0931             Exercise Goals   Increase Physical Activity Yes       Intervention Provide advice, education, support and counseling about physical activity/exercise needs.;Develop an individualized exercise prescription for aerobic and resistive training based on initial evaluation findings, risk stratification, comorbidities and participant's personal goals.       Expected Outcomes Achievement of increased cardiorespiratory fitness and enhanced flexibility, muscular endurance and strength shown through measurements of functional capacity and personal statement of participant.       Increase Strength and Stamina Yes  Intervention Provide advice, education, support and counseling about physical activity/exercise needs.;Develop an individualized exercise prescription for aerobic and resistive training based on initial evaluation findings, risk stratification, comorbidities and participant's personal goals.       Expected Outcomes Achievement of increased cardiorespiratory fitness and enhanced flexibility, muscular endurance and strength shown through measurements of functional capacity and personal statement of participant.          Exercise Goals Re-Evaluation :    Discharge Exercise Prescription (Final Exercise Prescription Changes):     Exercise Prescription Changes - 03/08/17 1700      Response to Exercise   Blood Pressure (Admit) 152/74   Blood Pressure (Exercise) 144/68   Blood Pressure (Exit) 142/64   Heart Rate (Admit) 79 bpm   Heart Rate (Exercise) 102 bpm   Heart Rate (Exit) 68 bpm   Rating of Perceived Exertion (Exercise) 13   Duration Progress to 45 minutes of aerobic exercise without signs/symptoms of physical distress   Intensity THRR unchanged     Progression   Progression Continue to progress workloads to maintain intensity  without signs/symptoms of physical distress.   Average METs 2.5     Resistance Training   Training Prescription Yes   Weight 2   Reps 10-15     NuStep   Level 2   Minutes 20   METs 2.6     Track   Laps 8   Minutes 10   METs 2.4      Nutrition:  Target Goals: Understanding of nutrition guidelines, daily intake of sodium 1500mg , cholesterol 200mg , calories 30% from fat and 7% or less from saturated fats, daily to have 5 or more servings of fruits and vegetables.  Biometrics:     Pre Biometrics - 02/02/17 1643      Pre Biometrics   Height 5' 7.25" (1.708 m)   Weight 281 lb 1.4 oz (127.5 kg)   Waist Circumference 52 inches   Hip Circumference 52.25 inches   Waist to Hip Ratio 1 %   BMI (Calculated) 43.8   Triceps Skinfold 34 mm   Grip Strength 34 kg   Flexibility 0 in       Nutrition Therapy Plan and Nutrition Goals:     Nutrition Therapy & Goals - 02/03/17 1154      Nutrition Therapy   Diet Carb Modified, Therapeutic Lifestyle Changes     Personal Nutrition Goals   Nutrition Goal Wt loss goal of 1-2 lb/week to a wt loss goal of 6-24 lb at graduation from [facility] Goal #2 Improved glycemic control as evidenced by pt's A1c trending from 7.7 toward less than 7.0     Intervention Plan   Intervention Prescribe, educate and counsel regarding individualized specific dietary modifications aiming towards targeted core components such as weight, hypertension, lipid management, diabetes, heart failure and other comorbidities.   Expected Outcomes Short Term Goal: Understand basic principles of dietary content, such as calories, fat, sodium, cholesterol and nutrients.;Long Term Goal: Adherence to prescribed nutrition plan.      Nutrition Discharge: Nutrition Scores:   Nutrition Goals Re-Evaluation:   Nutrition Goals Re-Evaluation:   Nutrition Goals Discharge (Final Nutrition Goals Re-Evaluation):   Psychosocial: Target Goals: Acknowledge  presence or absence of significant depression and/or stress, maximize coping skills, provide positive support system. Participant is able to verbalize types and ability to use techniques and skills needed for reducing stress and depression.  Initial Review & Psychosocial Screening:     Initial Psych  Review & Screening - 02/09/17 1608      Initial Review   Current issues with Current Anxiety/Panic     Family Dynamics   Good Support System? Yes   Comments pt brother cares for pt.  Pt resides alone.     Barriers   Psychosocial barriers to participate in program The patient should benefit from training in stress management and relaxation.      Quality of Life Scores:     Quality of Life - 03/16/17 1608      Quality of Life Scores   Health/Function Pre 20.1 %   Socioeconomic Pre 27.33 %   Psych/Spiritual Pre 25.29 %   Family Pre 18.5 %   GLOBAL Pre 22.52 %      PHQ-9: Recent Review Flowsheet Data    Depression screen [facility] 2/9 02/20/2017 12/08/2015 12/07/2015 06/04/2015   Decreased Interest 1 0 1 0   Down, Depressed, Hopeless 0 0 1 0   PHQ - 2 Score 1 0 2 0   Altered sleeping - - 0 -   Tired, decreased energy - - 3 -   Change in appetite - - 1 -   Feeling bad or failure about yourself  - - 0 -   Trouble concentrating - - 1 -   Moving slowly or fidgety/restless - - 0 -   Suicidal thoughts - - 0 -   PHQ-9 Score - - 7 -   Difficult doing work/chores - - Somewhat difficult -     Interpretation of Total Score  Total Score Depression Severity:  1-4 = Minimal depression, 5-9 = Mild depression, 10-14 = Moderate depression, 15-19 = Moderately severe depression, 20-27 = Severe depression   Psychosocial Evaluation and Intervention:   Psychosocial Re-Evaluation:   Psychosocial Discharge (Final Psychosocial Re-Evaluation):   Vocational Rehabilitation: Provide vocational rehab assistance to qualifying candidates.   Vocational Rehab Evaluation & Intervention:     Vocational  Rehab - 02/09/17 1610      Initial Vocational Rehab Evaluation & Intervention   Assessment shows need for Vocational Rehabilitation No      Education: Education Goals: Education classes will be provided on a weekly basis, covering required topics. Participant will state understanding/return demonstration of topics presented.  Learning Barriers/Preferences:     Learning Barriers/Preferences - 02/02/17 0929      Learning Barriers/Preferences   Learning Barriers Sight   Learning Preferences Computer/Internet;Video;Pictoral      Education Topics: Count Your Pulse:  -Group instruction provided by verbal instruction, demonstration, patient participation and written materials to support subject.  Instructors address importance of being able to find your pulse and how to count your pulse when at home without a heart monitor.  Patients get hands on experience counting their pulse with staff help and individually.   Heart Attack, Angina, and Risk Factor Modification:  -Group instruction provided by verbal instruction, video, and written materials to support subject.  Instructors address signs and symptoms of angina and heart attacks.    Also discuss risk factors for heart disease and how to make changes to improve heart health risk factors.   Functional Fitness:  -Group instruction provided by verbal instruction, demonstration, patient participation, and written materials to support subject.  Instructors address safety measures for doing things around the house.  Discuss how to get up and down off the floor, how to pick things up properly, how to safely get out of a chair without assistance, and balance training.   Meditation and Mindfulness:  -  Group instruction provided by verbal instruction, patient participation, and written materials to support subject.  Instructor addresses importance of mindfulness and meditation practice to help reduce stress and improve awareness.  Instructor also  leads participants through a meditation exercise.    Stretching for Flexibility and Mobility:  -Group instruction provided by verbal instruction, patient participation, and written materials to support subject.  Instructors lead participants through series of stretches that are designed to increase flexibility thus improving mobility.  These stretches are additional exercise for major muscle groups that are typically performed during regular warm up and cool down.   Hands Only CPR:  -Group verbal, video, and participation provides a basic overview of AHA guidelines for community CPR. Role-play of emergencies allow participants the opportunity to practice calling for help and chest compression technique with discussion of AED use.   Hypertension: -Group verbal and written instruction that provides a basic overview of hypertension including the most recent diagnostic guidelines, risk factor reduction with self-care instructions and medication management.    Nutrition I class: Heart Healthy Eating:  -Group instruction provided by PowerPoint slides, verbal discussion, and written materials to support subject matter. The instructor gives an explanation and review of the Therapeutic Lifestyle Changes diet recommendations, which includes a discussion on lipid goals, dietary fat, sodium, fiber, plant stanol/sterol esters, sugar, and the components of a well-balanced, healthy diet.   Nutrition II class: Lifestyle Skills:  -Group instruction provided by PowerPoint slides, verbal discussion, and written materials to support subject matter. The instructor gives an explanation and review of label reading, grocery shopping for heart health, heart healthy recipe modifications, and ways to make healthier choices when eating out.   Diabetes Question & Answer:  -Group instruction provided by PowerPoint slides, verbal discussion, and written materials to support subject matter. The instructor gives an  explanation and review of diabetes co-morbidities, pre- and post-prandial blood glucose goals, pre-exercise blood glucose goals, signs, symptoms, and treatment of hypoglycemia and hyperglycemia, and foot care basics.   Diabetes Blitz:  -Group instruction provided by PowerPoint slides, verbal discussion, and written materials to support subject matter. The instructor gives an explanation and review of the physiology behind type 1 and type 2 diabetes, diabetes medications and rational behind using different medications, pre- and post-prandial blood glucose recommendations and Hemoglobin A1c goals, diabetes diet, and exercise including blood glucose guidelines for exercising safely.    Portion Distortion:  -Group instruction provided by PowerPoint slides, verbal discussion, written materials, and food models to support subject matter. The instructor gives an explanation of serving size versus portion size, changes in portions sizes over the last 20 years, and what consists of a serving from each food group.   Stress Management:  -Group instruction provided by verbal instruction, video, and written materials to support subject matter.  Instructors review role of stress in heart disease and how to cope with stress positively.     Exercising on Your Own:  -Group instruction provided by verbal instruction, power point, and written materials to support subject.  Instructors discuss benefits of exercise, components of exercise, frequency and intensity of exercise, and end points for exercise.  Also discuss use of nitroglycerin and activating EMS.  Review options of places to exercise outside of rehab.  Review guidelines for sex with heart disease.   Cardiac Drugs I:  -Group instruction provided by verbal instruction and written materials to support subject.  Instructor reviews cardiac drug classes: antiplatelets, anticoagulants, beta blockers, and statins.  Instructor discusses reasons, side effects,  and  lifestyle considerations for each drug class.   Cardiac Drugs II:  -Group instruction provided by verbal instruction and written materials to support subject.  Instructor reviews cardiac drug classes: angiotensin converting enzyme inhibitors (ACE-I), angiotensin II receptor blockers (ARBs), nitrates, and calcium channel blockers.  Instructor discusses reasons, side effects, and lifestyle considerations for each drug class.   Anatomy and Physiology of the Circulatory System:  Group verbal and written instruction and models provide basic cardiac anatomy and physiology, with the coronary electrical and arterial systems. Review of: AMI, Angina, Valve disease, Heart Failure, Peripheral Artery Disease, Cardiac Arrhythmia, Pacemakers, and the ICD.   Other Education:  -Group or individual verbal, written, or video instructions that support the educational goals of the cardiac rehab program.   Knowledge Questionnaire Score:     Knowledge Questionnaire Score - 02/02/17 1646      Knowledge Questionnaire Score   Pre Score 20/24      Core Components/Risk Factors/Patient Goals at Admission:     Personal Goals and Risk Factors at Admission - 02/02/17 0934      Core Components/Risk Factors/Patient Goals on Admission    Weight Management Yes;Obesity;Weight Loss   Intervention Weight Management: Develop a combined nutrition and exercise program designed to reach desired caloric intake, while maintaining appropriate intake of nutrient and fiber, sodium and fats, and appropriate energy expenditure required for the weight goal.;Weight Management: Provide education and appropriate resources to help participant work on and attain dietary goals.;Obesity: Provide education and appropriate resources to help participant work on and attain dietary goals.;Weight Management/Obesity: Establish reasonable short term and long term weight goals.   Expected Outcomes Short Term: Continue to assess and modify  interventions until short term weight is achieved;Weight Loss: Understanding of general recommendations for a balanced deficit meal plan, which promotes 1-2 lb weight loss per week and includes a negative energy balance of [phone] kcal/d;Understanding recommendations for meals to include 15-35% energy as protein, 25-35% energy from fat, 35-60% energy from carbohydrates, less than 200mg  of dietary cholesterol, 20-35 gm of total fiber daily;Understanding of distribution of calorie intake throughout the day with the consumption of 4-5 meals/snacks   Diabetes Yes   Intervention Provide education about signs/symptoms and action to take for hypo/hyperglycemia.;Provide education about proper nutrition, including hydration, and aerobic/resistive exercise prescription along with prescribed medications to achieve blood glucose in normal ranges: Fasting glucose 65-99 mg/dL   Expected Outcomes Short Term: Participant verbalizes understanding of the signs/symptoms and immediate care of hyper/hypoglycemia, proper foot care and importance of medication, aerobic/resistive exercise and nutrition plan for blood glucose control.;Long Term: Attainment of HbA1C < 7%.   Hypertension Yes   Intervention Provide education on lifestyle modifcations including regular physical activity/exercise, weight management, moderate sodium restriction and increased consumption of fresh fruit, vegetables, and low fat dairy, alcohol moderation, and smoking cessation.;Monitor prescription use compliance.   Expected Outcomes Short Term: Continued assessment and intervention until BP is < 140/92mm HG in hypertensive participants. < 130/58mm HG in hypertensive participants with diabetes, heart failure or chronic kidney disease.;Long Term: Maintenance of blood pressure at goal levels.   Lipids Yes   Intervention Provide education and support for participant on nutrition & aerobic/resistive exercise along with prescribed medications to achieve LDL  70mg , HDL >40mg .   Expected Outcomes Short Term: Participant states understanding of desired cholesterol values and is compliant with medications prescribed. Participant is following exercise prescription and nutrition guidelines.;Long Term: Cholesterol controlled with medications as prescribed, with individualized exercise RX and with personalized  nutrition plan. Value goals: LDL < 70mg , HDL > 40 mg.   Personal Goal Other Yes   Personal Goal Learn nutrition and improve motivation to exercise and be more active.   Intervention Provide nutrition counsling and exercise programming to assist with weighloss goals, making dietary changes and developing an exercise routine   Expected Outcomes Pt will build on aerobic activity, learn HH and diabetic diet and lose weight      Core Components/Risk Factors/Patient Goals Review:    Core Components/Risk Factors/Patient Goals at Discharge (Final Review):    ITP Comments:     ITP Comments    Row Name 02/02/17 0927           ITP Comments Medical Director, Dr. Fransico Him          Comments: Dantonio is making expected progress toward personal goals after completing 5 sessions. Recommend continued exercise and life style modification education including  stress management and relaxation techniques to decrease cardiac risk profile. Josephanthony last day of exercise was on 03/06/17. Delson has been out of town due to a family emergency and is supposed to be back to exercise at the end of the week.[name], RN,BSN 03/21/2017 12:15 PM

## 2017-03-22 ENCOUNTER — Ambulatory Visit: Payer: Medicare Other | Admitting: Nurse Practitioner

## 2017-03-24 ENCOUNTER — Encounter (HOSPITAL_COMMUNITY): Payer: Medicare Other

## 2017-03-31 ENCOUNTER — Encounter (HOSPITAL_COMMUNITY)
Admission: RE | Admit: 2017-03-31 | Discharge: 2017-03-31 | Disposition: A | Discharge status: Home / Self Care | Payer: Medicare Other | Source: Ambulatory Visit | Attending: Interventional Cardiology | Admitting: Interventional Cardiology

## 2017-03-31 DIAGNOSIS — Z794 Long term (current) use of insulin: Secondary | ICD-10-CM | POA: Diagnosis not present

## 2017-03-31 DIAGNOSIS — N182 Chronic kidney disease, stage 2 (mild): Secondary | ICD-10-CM | POA: Diagnosis not present

## 2017-03-31 DIAGNOSIS — I130 Hypertensive heart and chronic kidney disease with heart failure and stage 1 through stage 4 chronic kidney disease, or unspecified chronic kidney disease: Secondary | ICD-10-CM | POA: Insufficient documentation

## 2017-03-31 DIAGNOSIS — Z8673 Personal history of transient ischemic attack (TIA), and cerebral infarction without residual deficits: Secondary | ICD-10-CM | POA: Insufficient documentation

## 2017-03-31 DIAGNOSIS — Z7982 Long term (current) use of aspirin: Secondary | ICD-10-CM | POA: Diagnosis not present

## 2017-03-31 DIAGNOSIS — Z7902 Long term (current) use of antithrombotics/antiplatelets: Secondary | ICD-10-CM | POA: Insufficient documentation

## 2017-03-31 DIAGNOSIS — Z951 Presence of aortocoronary bypass graft: Secondary | ICD-10-CM

## 2017-03-31 DIAGNOSIS — K219 Gastro-esophageal reflux disease without esophagitis: Secondary | ICD-10-CM | POA: Diagnosis not present

## 2017-03-31 DIAGNOSIS — J849 Interstitial pulmonary disease, unspecified: Secondary | ICD-10-CM | POA: Insufficient documentation

## 2017-03-31 DIAGNOSIS — E785 Hyperlipidemia, unspecified: Secondary | ICD-10-CM | POA: Insufficient documentation

## 2017-03-31 DIAGNOSIS — G250 Essential tremor: Secondary | ICD-10-CM | POA: Insufficient documentation

## 2017-03-31 DIAGNOSIS — Z79899 Other long term (current) drug therapy: Secondary | ICD-10-CM | POA: Diagnosis not present

## 2017-03-31 DIAGNOSIS — E1122 Type 2 diabetes mellitus with diabetic chronic kidney disease: Secondary | ICD-10-CM | POA: Diagnosis not present

## 2017-03-31 DIAGNOSIS — I2510 Atherosclerotic heart disease of native coronary artery without angina pectoris: Secondary | ICD-10-CM | POA: Insufficient documentation

## 2017-03-31 DIAGNOSIS — G4730 Sleep apnea, unspecified: Secondary | ICD-10-CM | POA: Insufficient documentation

## 2017-03-31 DIAGNOSIS — I5032 Chronic diastolic (congestive) heart failure: Secondary | ICD-10-CM | POA: Diagnosis not present

## 2017-03-31 LAB — GLUCOSE, CAPILLARY: GLUCOSE-CAPILLARY: 130 mg/dL — AB (ref 65–99)

## 2017-04-03 ENCOUNTER — Encounter: Payer: Self-pay | Admitting: Neurology

## 2017-04-03 ENCOUNTER — Ambulatory Visit (INDEPENDENT_AMBULATORY_CARE_PROVIDER_SITE_OTHER): Payer: Medicare Other | Admitting: Neurology

## 2017-04-03 ENCOUNTER — Encounter (HOSPITAL_COMMUNITY): Payer: Medicare Other

## 2017-04-03 VITALS — BP 148/70 | HR 83 | Ht 67.25 in | Wt 276.2 lb

## 2017-04-03 DIAGNOSIS — F419 Anxiety disorder, unspecified: Secondary | ICD-10-CM | POA: Diagnosis not present

## 2017-04-03 DIAGNOSIS — I25110 Atherosclerotic heart disease of native coronary artery with unstable angina pectoris: Secondary | ICD-10-CM

## 2017-04-03 DIAGNOSIS — G44219 Episodic tension-type headache, not intractable: Secondary | ICD-10-CM

## 2017-04-03 DIAGNOSIS — G250 Essential tremor: Secondary | ICD-10-CM | POA: Diagnosis not present

## 2017-04-03 DIAGNOSIS — E0842 Diabetes mellitus due to underlying condition with diabetic polyneuropathy: Secondary | ICD-10-CM | POA: Diagnosis not present

## 2017-04-03 NOTE — Patient Instructions (Addendum)
We will not be making any changes to your medications as it seems that stress triggers your tremors to get worse.  With this in mind, it is best to treat underlying anxiety and stress, so you will be referred to see a therapist.  Return to clinic in 6 months

## 2017-04-03 NOTE — Progress Notes (Signed)
Follow-up Visit   Date: 04/03/17    Edward [name] MRN: [id number] DOB: 1940-11-14   Interim History: Edward [name] is a 76 y.o. right-handed African American male with coronary artery disease, TIA (2002, manifested as right sided numbness), insulin dependent diabetes mellitus type 2, hypertension, hyperlipidemia, GERD and BPH returning to the clinic for follow-up of essential tremor and new headaches.  The patient was accompanied to the clinic by self.  History of present illness: He moved from [place] in February 2015 to be closer to his brother. He has been diabetic since 1985 and in late 1990s, he developed numbness > tingling of the feet which seems to have been stable since onset. He has paresthesias below the level of the ankles, worse on the right side, as well as interemittent numbness/tingling of the right hand. He ambulates independently. He was started neurontin 169m twice daily.  [place]. Headaches improved significantly after he retired. He has short-term memory problems, which does not interefere with daily life. Mostly forgetting little details or tasks. He is driving and has not been involving in any accidents. He is able to do all his IADLs and ADLs.   UPDATE 12/02/2014:  He was last seen in June 2015 and has new complains of voice changes. He complains about bilateral hand tremor and voice change which is worse when he is stressed. His handwriting has become worse and he has more difficulty with fine motor tasks.  Tremors are predominately worse when trying to do tasks, but can be present when he is resting.  His sister, mother, and two brothers have tremors.  He was started on propranolol 463mBID which helped some.   UPDATE 07/28/2016:  Patient scheduled sooner appointment because he feels that his tremors have become severe.  They are interfering with his daily motor tasks.  For instance, he  was trying to deposit cash in the ATM yesterday and accidentally hit the wrong numbers that eventually his account was locked and the amount he placed was incorrect.  He was very embarrassed and needed to call a bank teller to help him and resolve the issue inside.  He has noticed that stress and anxiety always exacerbates his tremors.  He takes clonazepam 50m58mor anxiety, which significantly calms him and helps with tremors.  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    UPDATE 09/28/2016:   He scheduled a sooner appointment because he continues to have problems with his tremors.  He noticed some improvement with gabapentin 300m90mice daily.  He feels the greatest benefit with clonazepam, which he takes for anxiety, and is requesting to take this for tremors.   He was able to reduce his metoprolol to 25mg59m therefore, able to increase propranolol to 40mg 14mwhich has helped.    UPDATE 04/03/2017:  He is here for follow-up visit.  Today, he complains of 1 month history of sharp headaches.  He typically gets them once per week and lasts an hour.  Headaches are worse in the morning, but do not wake him up from sleeping.  Sneezing, coughing, or valsalva does not exacerbate his pain.  He does not need to treat the headaches because they are mild. He feels that his tremors can be worse, especially when he is anxious and stressed.  Recently, he visited his wife who he found  out been diagnosed with dementia and lives in a psychiatric ward.  He also requested to have blood sugar checked which was 313, he had breakfast two hours ago and will recheck at home and take his insulin.     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  He is no longer on Ranxea and takes aspirin and plavix.  He is also off metoprolol.    Medications:  Current Outpatient Prescriptions on File Prior to Visit  Medication Sig Dispense Refill  . acetaminophen (TYLENOL)  500 MG tablet Take 2 tablets (1,000 mg total) by mouth every 6 (six) hours as needed. 30 tablet 0  . amLODipine (NORVASC) 10 MG tablet take 1 tablet by mouth once daily 30 tablet 10  . aspirin EC 81 MG tablet Take 81 mg by mouth daily.    Marland Kitchen atorvastatin (LIPITOR) 80 MG tablet Take 80 mg by mouth daily.    . clonazePAM (KLONOPIN) 0.5 MG tablet Take 1 tablet (0.5 mg total) by mouth 2 (two) times daily as needed (tremors). 45 tablet 3  . clopidogrel (PLAVIX) 75 MG tablet Take 1 tablet (75 mg total) by mouth daily. 90 tablet 3  . diclofenac sodium (VOLTAREN) 1 % GEL Apply 2 g topically 4 (four) times daily as needed (for pain).     . furosemide (LASIX) 40 MG tablet take 1 tablet by mouth once daily 90 tablet 1  . gabapentin (NEURONTIN) 300 MG capsule Take 1 capsule (300 mg total) by mouth 2 (two) times daily.    . insulin aspart (NOVOLOG) 100 UNIT/ML injection Inject 30-40 units 3 times daily with meals. (Patient taking differently: Inject 40 Units into the skin 3 (three) times daily with meals. ) 30 mL 11  . insulin detemir (LEVEMIR) 100 UNIT/ML injection Inject 0.45 mLs (45 Units total) into the skin 2 (two) times daily. (Patient taking differently: Inject 70 Units into the skin at bedtime. ) 30 mL 11  . losartan (COZAAR) 25 MG tablet Take 1 tablet (25 mg total) by mouth daily. 30 tablet 1  . Polyvinyl Alcohol-Povidone (REFRESH OP) Place 1 drop into both eyes daily as needed (for dry eyes). Reported on 12/25/2015    . potassium chloride SA (K-DUR,KLOR-CON) 20 MEQ tablet take 1 tablet by mouth once daily 30 tablet 11  . propranolol (INDERAL) 40 MG tablet Take 40 mg by mouth 3 (three) times daily.  0  . ranitidine (ZANTAC) 150 MG tablet Take 1 tablet (150 mg total) by mouth at bedtime. 30 tablet 11  . VENTOLIN HFA 108 (90 BASE) MCG/ACT inhaler Inhale 2 puffs into the lungs every 6 (six) hours as needed for wheezing or shortness of breath.   0   No current facility-administered medications on file prior  to visit.     Allergies:  Allergies  Allergen Reactions  . Pneumococcal Vaccines Anaphylaxis  . No Known Allergies     Review of Systems:  CONSTITUTIONAL: No fevers, chills, night sweats, or weight loss.  EYES: No visual changes or eye pain ENT: No hearing changes.  No history of nose bleeds.   RESPIRATORY: No cough, wheezing and shortness of breath.   CARDIOVASCULAR: Negative for chest pain, and palpitations.   GI: Negative for abdominal discomfort, blood in stools or black stools.  No recent change in bowel habits.   GU:  No history of incontinence.   MUSCLOSKELETAL: +history of joint pain or swelling.  No myalgias.   SKIN: Negative for  lesions, rash, and itching.   ENDOCRINE: Negative for cold or heat intolerance, polydipsia or goiter.   PSYCH:  no depression or anxiety symptoms.   NEURO: As Above.   Vital Signs:  BP (!) 148/70   Pulse 83   Ht 5' 7.25" (1.708 m)   Wt 276 lb 4 oz (125.3 kg)   SpO2 97%   BMI 42.95 kg/m   Neurological Exam: MENTAL STATUS including orientation to time, place, person, recent and remote memory, attention span and concentration, language, and fund of knowledge is normal.  Tremulous speech pattern, mild dysphonia.  No dysarthria.   CRANIAL NERVES:  Pupils are small and reactive to light.  Extraocular muscle intact.  Mild bilateral ptosis (old).  Face is symmetric.  Palate elevates symmetrically.  Tongue is midline.    MOTOR:  Motor strength is 5/5 in all extremities.  There is no rigidity.  Mild hand tremor with arms outstretching and with finger to nose testing.  SENSORY:  Vibration is absent at the ankles bilaterally.   REFLEXES:  2+/4 in the upper extremities and absent in the legs   COORDINATION/GAIT:    Gait narrow based and stable unassisted.  There is no bradykinesia with finger or toe tapping.   Data: EMG 12/01/2014: 1. 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 Overall, these findings are moderately severe in degree electrically.  2. 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 There is no evidence of a cervical/lumbosacral radiculopathy affecting the right side  Lab Results  Component Value Date   HGBA1C 7.7 12/16/2016   Labs 04/08/2014:  Copper 64*, ceruloplasmin 18, vitamin B12 290, SPEP/UPEP with IFE no M protein  CT head 10/21/2014 and 01/15/2015:  Negative  CT head wo contrast 04/21/2016:  Negative  IMPRESSION/PLAN: 1. Essential tremor involving the hands and voice. Clinically, tremors are stable so I will not make any changes to his medication. His major trigger is stress and I emphasized the importance addressing underlying stress management, which he understand and is interested in seeing a therapist Continue propranolol 9m TID. He has been discontinued off metoprolol by his cardiologist, so we can increase this further, if needed.   Continue gabapentin 3049mtwice daily Continue clonazepam 0.42m58mwice daily as needed for tremors  - stressed the importance of using this only as needed.     2.  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  Episodic tension headaches, reassured patient that I do not see anything worrisome on his exam to warrant CT head which he is requesting.  Headaches are occurring once per week and resolve within an hour and do not warrant pharmacological treatment.  Recommend rest and applying a cold wash cloth to forehead.  He can take tylenol as needed, limit to twice per week.  4.  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   MG antibody negative  5.  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  EMG with moderately severe neuropathy affecting the right side.  Clinically stale.   Return to clinic in 6 months  The duration of this appointment  visit was 30 minutes of face-to-face time with the patient.  Greater than 50% of this time was spent in counseling, explanation of diagnosis, planning of further management, and coordination of care.   Thank you for allowing me to participate in patient's care.  If I can answer any additional questions, I would be pleased to do so.    Sincerely,    Donika K. Posey Pronto, DO

## 2017-04-03 NOTE — Addendum Note (Signed)
Addended by: Chester Holstein on: 04/03/2017 12:11 PM   Modules accepted: Orders

## 2017-04-06 ENCOUNTER — Encounter: Payer: Self-pay | Admitting: *Deleted

## 2017-04-07 ENCOUNTER — Encounter (HOSPITAL_COMMUNITY): Payer: Medicare Other

## 2017-04-10 ENCOUNTER — Telehealth: Payer: Self-pay | Admitting: Interventional Cardiology

## 2017-04-10 ENCOUNTER — Encounter (HOSPITAL_COMMUNITY)
Admission: RE | Admit: 2017-04-10 | Discharge: 2017-04-10 | Disposition: A | Discharge status: Home / Self Care | Payer: Medicare Other | Source: Ambulatory Visit | Attending: Interventional Cardiology | Admitting: Interventional Cardiology

## 2017-04-10 VITALS — Ht 67.25 in | Wt 277.8 lb

## 2017-04-10 DIAGNOSIS — I5032 Chronic diastolic (congestive) heart failure: Secondary | ICD-10-CM | POA: Diagnosis not present

## 2017-04-10 DIAGNOSIS — I2510 Atherosclerotic heart disease of native coronary artery without angina pectoris: Secondary | ICD-10-CM | POA: Diagnosis not present

## 2017-04-10 DIAGNOSIS — J849 Interstitial pulmonary disease, unspecified: Secondary | ICD-10-CM | POA: Diagnosis not present

## 2017-04-10 DIAGNOSIS — Z951 Presence of aortocoronary bypass graft: Secondary | ICD-10-CM

## 2017-04-10 DIAGNOSIS — N182 Chronic kidney disease, stage 2 (mild): Secondary | ICD-10-CM | POA: Diagnosis not present

## 2017-04-10 DIAGNOSIS — I130 Hypertensive heart and chronic kidney disease with heart failure and stage 1 through stage 4 chronic kidney disease, or unspecified chronic kidney disease: Secondary | ICD-10-CM | POA: Diagnosis not present

## 2017-04-10 LAB — GLUCOSE, CAPILLARY: GLUCOSE-CAPILLARY: 214 mg/dL — AB (ref 65–99)

## 2017-04-10 NOTE — Telephone Encounter (Signed)
Edward Frederickson, RN from Cardiac Rehab called to report the patient has had a 2.5 kg weight gain since June 1. She states his lungs are clear but he does have 1+ swelling in his feet. The patient reports no SOB. He has not taken his Lasix today - he reported to Cardiac Rehab that he doesn't like to take it prior to exercise. Instructed Maria to reiterate to patient to limit salt, elevate legs while sitting, and to take his medication as soon as he gets him. He should continue to monitor his weights daily and should call Dr. Tamala Julian if swelling worsens. She was grateful for assistance.

## 2017-04-10 NOTE — Telephone Encounter (Signed)
New Message:    Pt is at [facility] would like to talk to the triage nurse-

## 2017-04-10 NOTE — Progress Notes (Signed)
Edward [name]'s weight is up 2.5 kg from his last exercise session on 03/31/2017. Edward [name] denies shortness of breath but does say he has swelling in his legs. Upon inspection 1+ pitting bilateral lower extremity edema is present. Upon assessment lung fields clear upon ascultation. Oxygen saturation 97% on room air. Lenice Llamas RN at Dr [facility] office called and notified. Edward [name] admit's he has not been following a heart healthy like he should. Patient instructed to watch his salt intake and monitor his weights at home.Will fax exercise flow sheets to Dr. Thompson Caul office for review. Edward [name] plans to return to exercise on Friday.[name], RN,BSN 04/10/2017 4:10 PM

## 2017-04-12 DIAGNOSIS — B351 Tinea unguium: Secondary | ICD-10-CM | POA: Diagnosis not present

## 2017-04-12 DIAGNOSIS — I70293 Other atherosclerosis of native arteries of extremities, bilateral legs: Secondary | ICD-10-CM | POA: Diagnosis not present

## 2017-04-12 DIAGNOSIS — M79672 Pain in left foot: Secondary | ICD-10-CM | POA: Diagnosis not present

## 2017-04-12 DIAGNOSIS — M21961 Unspecified acquired deformity of right lower leg: Secondary | ICD-10-CM | POA: Diagnosis not present

## 2017-04-12 DIAGNOSIS — B353 Tinea pedis: Secondary | ICD-10-CM | POA: Diagnosis not present

## 2017-04-12 DIAGNOSIS — M21962 Unspecified acquired deformity of left lower leg: Secondary | ICD-10-CM | POA: Diagnosis not present

## 2017-04-12 DIAGNOSIS — E1351 Other specified diabetes mellitus with diabetic peripheral angiopathy without gangrene: Secondary | ICD-10-CM | POA: Diagnosis not present

## 2017-04-12 DIAGNOSIS — M79671 Pain in right foot: Secondary | ICD-10-CM | POA: Diagnosis not present

## 2017-04-14 ENCOUNTER — Encounter (HOSPITAL_COMMUNITY): Payer: Medicare Other

## 2017-04-17 ENCOUNTER — Encounter (HOSPITAL_COMMUNITY): Payer: Medicare Other

## 2017-04-17 ENCOUNTER — Telehealth (HOSPITAL_COMMUNITY): Payer: Self-pay | Admitting: Family Medicine

## 2017-04-18 ENCOUNTER — Encounter (HOSPITAL_COMMUNITY): Payer: Self-pay | Admitting: *Deleted

## 2017-04-18 DIAGNOSIS — Z951 Presence of aortocoronary bypass graft: Secondary | ICD-10-CM

## 2017-04-18 NOTE — Progress Notes (Signed)
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  acetaminophen (TYLENOL) 500 MG tablet, Take 2 tablets (1,000 mg total) by mouth every 6 (six) hours as needed., Disp: 30 tablet, Rfl: 0 .  amLODipine (NORVASC) 10 MG tablet, take 1 tablet by mouth once daily, Disp: 30 tablet, Rfl: 10 .  aspirin EC 81 MG tablet, Take 81 mg by mouth daily., Disp: , Rfl:  .  atorvastatin (LIPITOR) 80 MG tablet, Take 80 mg by mouth daily., Disp: , Rfl:  .  clonazePAM (KLONOPIN) 0.5 MG tablet, Take 1 tablet (0.5 mg total) by mouth 2 (two) times daily as needed (tremors)., Disp: 45 tablet, Rfl: 3 .  clopidogrel (PLAVIX) 75 MG tablet, Take 1 tablet (75 mg total) by mouth daily., Disp: 90 tablet, Rfl: 3 .  diclofenac sodium (VOLTAREN) 1 % GEL, Apply 2 g topically 4 (four) times daily as needed (for pain). , Disp: , Rfl:  .  furosemide (LASIX) 40 MG tablet, take 1 tablet by mouth once daily, Disp: 90 tablet, Rfl: 1 .  gabapentin (NEURONTIN) 300 MG capsule, Take 1 capsule (300 mg total) by mouth 2 (two) times daily., Disp: , Rfl:  .  insulin aspart (NOVOLOG) 100 UNIT/ML injection, Inject 30-40 units 3 times daily with meals. (Patient taking differently: Inject 40 Units into the skin 3 (three) times daily with meals. ), Disp: 30 mL, Rfl: 11 .  insulin detemir (LEVEMIR) 100 UNIT/ML injection, Inject 0.45 mLs (45 Units total) into the skin 2 (two) times daily. (Patient taking differently: Inject 70  Units into the skin at bedtime. ), Disp: 30 mL, Rfl: 11 .  losartan (COZAAR) 25 MG tablet, Take 1 tablet (25 mg total) by mouth daily., Disp: 30 tablet, Rfl: 1 .  Polyvinyl Alcohol-Povidone (REFRESH OP), Place 1 drop into both eyes daily as needed (for dry eyes). Reported on 12/25/2015, Disp: , Rfl:  .  potassium chloride SA (K-DUR,KLOR-CON) 20 MEQ tablet, take 1 tablet by mouth once daily, Disp: 30 tablet, Rfl: 11 .  propranolol (INDERAL) 40 MG tablet, Take 40 mg by mouth 3 (three) times daily., Disp: , Rfl: 0 .  ranitidine (ZANTAC) 150 MG tablet, Take 1 tablet (150 mg total) by mouth at bedtime. (Patient not taking: Reported on 04/10/2017), Disp: 30 tablet, Rfl: 11 .  VENTOLIN HFA 108 (90 BASE) MCG/ACT inhaler, Inhale 2 puffs into the lungs every 6 (six) hours as needed for wheezing or shortness of breath. , Disp: , Rfl: 0  Past Medical History: Past Medical History:  Diagnosis Date  . Adenomatous colon polyp 04/2011  . CAD (coronary artery disease)    a. 01/2015 DES to LAD  b. 12/2015: [place] 85% oLCx lesion--> rx therapy.  . Chronic diastolic CHF (congestive heart failure) ([place])   . CKD (chronic kidney disease), stage II    a. probable CKD II-III with baseline CR 1.1-1.3.  . DM type 2 (diabetes mellitus, type 2), insulin dependent  01/29/2014   fasting cbg 50-120 with new regimen  . Dyslipidemia, goal LDL below 70 01/29/2014  . Enlarged prostate   . Essential tremor    a. on proprnolol  . GERD (gastroesophageal reflux disease)   . Hypertension   . Internal hemorrhoid   . Sleep apnea    does not use cpap (06/04/2015)  . TIA (transient ischemic attack) 2002    Tobacco Use: History  Smoking Status  . Never Smoker  Smokeless Tobacco  . Never Used    Labs: Recent Review Flowsheet Data    Labs for ITP Cardiac and Pulmonary Rehab Latest Ref Rng & Units 11/09/2016 11/10/2016 12/16/2016 01/02/2017 02/06/2017   Cholestrol 0 - 200 mg/dL - - - 143 166   LDLCALC 0 - 99 mg/dL - - - 88 107(H)   LDLDIRECT  mg/dL - - - - -   HDL >40 mg/dL - - - 37.70(L) 30(L)   Trlycerides <150 mg/dL - - - 87.0 146   Hemoglobin A1c - - - 7.7  - -   PHART 7.350 - 7.450 7.341(L) - - - -   PCO2ART 32.0 - 48.0 mmHg 45.1 - - - -   HCO3 20.0 - 28.0 mmol/L 24.2 - - - -   TCO2 0 - 100 mmol/L 26 24 - - -   ACIDBASEDEF 0.0 - 2.0 mmol/L 2.0 - - - -   O2SAT % 97.0 - - - -      Capillary Blood Glucose: Lab Results  Component Value Date   GLUCAP 214 (H) 04/10/2017   GLUCAP 130 (H) 03/31/2017   GLUCAP 112 (H) 03/10/2017   GLUCAP 117 (H) 03/06/2017   GLUCAP 115 (H) 03/06/2017     Exercise Target Goals:    Exercise Program Goal: Individual exercise prescription set with THRR, safety & activity barriers. Participant demonstrates ability to understand and report RPE using BORG scale, to self-measure pulse accurately, and to acknowledge the importance of the exercise prescription.  Exercise Prescription Goal: Starting with aerobic activity 30 plus minutes a day, 3 days per week for initial exercise prescription. Provide home exercise prescription and guidelines that participant acknowledges understanding prior to discharge.  Activity Barriers & Risk Stratification:   6 Minute Walk:   Oxygen Initial Assessment:   Oxygen Re-Evaluation:   Oxygen Discharge (Final Oxygen Re-Evaluation):   Initial Exercise Prescription:   Perform Capillary Blood Glucose checks as needed.  Exercise Prescription Changes:      Exercise Prescription Changes    Row Name 03/08/17 1700 04/19/17 1600           Response to Exercise   Blood Pressure (Admit) 152/74 138/70      Blood Pressure (Exercise) 144/68 158/72      Blood Pressure (Exit) 142/64 138/70      Heart Rate (Admit) 79 bpm 81 bpm      Heart Rate (Exercise) 102 bpm 106 bpm      Heart Rate (Exit) 68 bpm 81 bpm      Rating of Perceived Exertion (Exercise) 13 14      Duration Progress to 45 minutes of aerobic exercise without signs/symptoms of physical  distress Progress to 45 minutes of aerobic exercise without signs/symptoms of physical distress      Intensity THRR unchanged THRR unchanged        Progression   Progression Continue to progress workloads to maintain intensity without signs/symptoms of physical distress. Continue to progress workloads to maintain intensity without signs/symptoms of physical distress.  Average METs 2.5 2.7        Resistance Training   Training Prescription Yes Yes      Weight 2 2      Reps 10-15 10-15        NuStep   Level 2 2      Minutes 20 20      METs 2.6 2.6        Track   Laps 8 10      Minutes 10 10      METs 2.4 2.7        Home Exercise Plan   Plans to continue exercise at  - Home (comment)      Frequency  - Add 3 additional days to program exercise sessions.         Exercise Comments:      Exercise Comments    Row Name 03/20/17 1636 04/19/17 1621         Exercise Comments Pt has been absent from [facility] for the past couple of week due to traveling for work.  He hopes to return next week.  Pt's attendance has been inconsistent due to travel for work, but he does well with exercise on the days he is present.         Exercise Goals and Review:   Exercise Goals Re-Evaluation :     Exercise Goals Re-Evaluation    Row Name 04/19/17 1619             Exercise Goal Re-Evaluation   Exercise Goals Review Increase Physical Activity;Increase Strenth and Stamina       Comments Pt has missed many sessions due to travel for work, but when he's present he does well with exercise       Expected Outcomes With a more consistent attendance following the exercise prescription, increasing workloads as tolerated, pt will begin to increase exercise tolerance and increase strenght and stamina.            Discharge Exercise Prescription (Final Exercise Prescription Changes):     Exercise Prescription Changes - 04/19/17 1600      Response to Exercise   Blood Pressure (Admit) 138/70    Blood Pressure (Exercise) 158/72   Blood Pressure (Exit) 138/70   Heart Rate (Admit) 81 bpm   Heart Rate (Exercise) 106 bpm   Heart Rate (Exit) 81 bpm   Rating of Perceived Exertion (Exercise) 14   Duration Progress to 45 minutes of aerobic exercise without signs/symptoms of physical distress   Intensity THRR unchanged     Progression   Progression Continue to progress workloads to maintain intensity without signs/symptoms of physical distress.   Average METs 2.7     Resistance Training   Training Prescription Yes   Weight 2   Reps 10-15     NuStep   Level 2   Minutes 20   METs 2.6     Track   Laps 10   Minutes 10   METs 2.7     Home Exercise Plan   Plans to continue exercise at Home (comment)   Frequency Add 3 additional days to program exercise sessions.      Nutrition:  Target Goals: Understanding of nutrition guidelines, daily intake of sodium 1500mg , cholesterol 200mg , calories 30% from fat and 7% or less from saturated fats, daily to have 5 or more servings of fruits and vegetables.  Biometrics:    Nutrition Therapy Plan and Nutrition Goals:   Nutrition Discharge: Nutrition Scores:  Nutrition Goals Re-Evaluation:   Nutrition Goals Re-Evaluation:   Nutrition Goals Discharge (Final Nutrition Goals Re-Evaluation):   Psychosocial: Target Goals: Acknowledge presence or absence of significant depression and/or stress, maximize coping skills, provide positive support system. Participant is able to verbalize types and ability to use techniques and skills needed for reducing stress and depression.  Initial Review & Psychosocial Screening:   Quality of Life Scores:     Quality of Life - 03/16/17 1608      Quality of Life Scores   Health/Function Pre 20.1 %   Socioeconomic Pre 27.33 %   Psych/Spiritual Pre 25.29 %   Family Pre 18.5 %   GLOBAL Pre 22.52 %      PHQ-9: Recent Review Flowsheet Data    Depression screen [facility] 2/9 02/20/2017 12/08/2015  12/07/2015 06/04/2015   Decreased Interest 1 0 1 0   Down, Depressed, Hopeless 0 0 1 0   PHQ - 2 Score 1 0 2 0   Altered sleeping - - 0 -   Tired, decreased energy - - 3 -   Change in appetite - - 1 -   Feeling bad or failure about yourself  - - 0 -   Trouble concentrating - - 1 -   Moving slowly or fidgety/restless - - 0 -   Suicidal thoughts - - 0 -   PHQ-9 Score - - 7 -   Difficult doing work/chores - - Somewhat difficult -     Interpretation of Total Score  Total Score Depression Severity:  1-4 = Minimal depression, 5-9 = Mild depression, 10-14 = Moderate depression, 15-19 = Moderately severe depression, 20-27 = Severe depression   Psychosocial Evaluation and Intervention:   Psychosocial Re-Evaluation:     Psychosocial Re-Evaluation    [place] Name 03/21/17 1215             Psychosocial Re-Evaluation   Comments -  unable to assess. Bohden's last day of exercise was on 03/06/2017.          Psychosocial Discharge (Final Psychosocial Re-Evaluation):     Psychosocial Re-Evaluation - 03/21/17 1215      Psychosocial Re-Evaluation   Comments --  unable to assess. Reynoldo's last day of exercise was on 03/06/2017.      Vocational Rehabilitation: Provide vocational rehab assistance to qualifying candidates.   Vocational Rehab Evaluation & Intervention:   Education: Education Goals: Education classes will be provided on a weekly basis, covering required topics. Participant will state understanding/return demonstration of topics presented.  Learning Barriers/Preferences:   Education Topics: Count Your Pulse:  -Group instruction provided by verbal instruction, demonstration, patient participation and written materials to support subject.  Instructors address importance of being able to find your pulse and how to count your pulse when at home without a heart monitor.  Patients get hands on experience counting their pulse with staff help and individually.   Heart Attack,  Angina, and Risk Factor Modification:  -Group instruction provided by verbal instruction, video, and written materials to support subject.  Instructors address signs and symptoms of angina and heart attacks.    Also discuss risk factors for heart disease and how to make changes to improve heart health risk factors.   Functional Fitness:  -Group instruction provided by verbal instruction, demonstration, patient participation, and written materials to support subject.  Instructors address safety measures for doing things around the house.  Discuss how to get up and down off the floor, how to pick things up properly, how to  safely get out of a chair without assistance, and balance training.   Meditation and Mindfulness:  -Group instruction provided by verbal instruction, patient participation, and written materials to support subject.  Instructor addresses importance of mindfulness and meditation practice to help reduce stress and improve awareness.  Instructor also leads participants through a meditation exercise.    Stretching for Flexibility and Mobility:  -Group instruction provided by verbal instruction, patient participation, and written materials to support subject.  Instructors lead participants through series of stretches that are designed to increase flexibility thus improving mobility.  These stretches are additional exercise for major muscle groups that are typically performed during regular warm up and cool down.   Hands Only CPR:  -Group verbal, video, and participation provides a basic overview of AHA guidelines for community CPR. Role-play of emergencies allow participants the opportunity to practice calling for help and chest compression technique with discussion of AED use.   Hypertension: -Group verbal and written instruction that provides a basic overview of hypertension including the most recent diagnostic guidelines, risk factor reduction with self-care instructions and  medication management.    Nutrition I class: Heart Healthy Eating:  -Group instruction provided by PowerPoint slides, verbal discussion, and written materials to support subject matter. The instructor gives an explanation and review of the Therapeutic Lifestyle Changes diet recommendations, which includes a discussion on lipid goals, dietary fat, sodium, fiber, plant stanol/sterol esters, sugar, and the components of a well-balanced, healthy diet.   Nutrition II class: Lifestyle Skills:  -Group instruction provided by PowerPoint slides, verbal discussion, and written materials to support subject matter. The instructor gives an explanation and review of label reading, grocery shopping for heart health, heart healthy recipe modifications, and ways to make healthier choices when eating out.   Diabetes Question & Answer:  -Group instruction provided by PowerPoint slides, verbal discussion, and written materials to support subject matter. The instructor gives an explanation and review of diabetes co-morbidities, pre- and post-prandial blood glucose goals, pre-exercise blood glucose goals, signs, symptoms, and treatment of hypoglycemia and hyperglycemia, and foot care basics.   Diabetes Blitz:  -Group instruction provided by PowerPoint slides, verbal discussion, and written materials to support subject matter. The instructor gives an explanation and review of the physiology behind type 1 and type 2 diabetes, diabetes medications and rational behind using different medications, pre- and post-prandial blood glucose recommendations and Hemoglobin A1c goals, diabetes diet, and exercise including blood glucose guidelines for exercising safely.    Portion Distortion:  -Group instruction provided by PowerPoint slides, verbal discussion, written materials, and food models to support subject matter. The instructor gives an explanation of serving size versus portion size, changes in portions sizes over the last  20 years, and what consists of a serving from each food group.   Stress Management:  -Group instruction provided by verbal instruction, video, and written materials to support subject matter.  Instructors review role of stress in heart disease and how to cope with stress positively.     Exercising on Your Own:  -Group instruction provided by verbal instruction, power point, and written materials to support subject.  Instructors discuss benefits of exercise, components of exercise, frequency and intensity of exercise, and end points for exercise.  Also discuss use of nitroglycerin and activating EMS.  Review options of places to exercise outside of rehab.  Review guidelines for sex with heart disease.   Cardiac Drugs I:  -Group instruction provided by verbal instruction and written materials to support subject.  Instructor reviews cardiac drug classes: antiplatelets, anticoagulants, beta blockers, and statins.  Instructor discusses reasons, side effects, and lifestyle considerations for each drug class.   Cardiac Drugs II:  -Group instruction provided by verbal instruction and written materials to support subject.  Instructor reviews cardiac drug classes: angiotensin converting enzyme inhibitors (ACE-I), angiotensin II receptor blockers (ARBs), nitrates, and calcium channel blockers.  Instructor discusses reasons, side effects, and lifestyle considerations for each drug class.   Anatomy and Physiology of the Circulatory System:  Group verbal and written instruction and models provide basic cardiac anatomy and physiology, with the coronary electrical and arterial systems. Review of: AMI, Angina, Valve disease, Heart Failure, Peripheral Artery Disease, Cardiac Arrhythmia, Pacemakers, and the ICD.   Other Education:  -Group or individual verbal, written, or video instructions that support the educational goals of the cardiac rehab program.   Knowledge Questionnaire Score:   Core  Components/Risk Factors/Patient Goals at Admission:   Core Components/Risk Factors/Patient Goals Review:    Core Components/Risk Factors/Patient Goals at Discharge (Final Review):    ITP Comments:   Comments: Albaraa is making expected progress toward personal goals after completing 7 sessions. Recommend continued exercise and life style modification education including  stress management and relaxation techniques to decrease cardiac risk profile. Ival's attendance has been sporadic. Roney was out yesterday due to problems with shoulder pain and will be out on Friday due to an MD appointment.[name], RN,BSN 04/19/2017 5:00 PM

## 2017-04-21 ENCOUNTER — Telehealth: Payer: Self-pay | Admitting: Gastroenterology

## 2017-04-21 ENCOUNTER — Encounter (HOSPITAL_COMMUNITY): Payer: Medicare Other

## 2017-04-21 NOTE — Telephone Encounter (Signed)
Pam will you please contact the patient about his Plavix

## 2017-04-21 NOTE — Telephone Encounter (Signed)
LM for the patient to call me back today, 04-21-2017.  He has questions about stopping the Plavis for his Tuesday 6-26 colonoscopy.

## 2017-04-21 NOTE — Telephone Encounter (Signed)
The patient called me back. He just needed to clarification on his Plavix directions. He did stop it yesterday 04-20-2017. I told him that was correct and he can resume it the day after the procedure. Confirmed that his procedure is Tuesday 6-26 at 2:00 pm . He know to be on the 4th floor at 1:00 PM.  He thanked me for being efficient and giving him adequate information.

## 2017-04-24 ENCOUNTER — Encounter (HOSPITAL_COMMUNITY): Payer: Medicare Other

## 2017-04-24 ENCOUNTER — Ambulatory Visit (INDEPENDENT_AMBULATORY_CARE_PROVIDER_SITE_OTHER): Payer: Medicare Other | Admitting: Ophthalmology

## 2017-04-24 ENCOUNTER — Telehealth: Payer: Self-pay | Admitting: Gastroenterology

## 2017-04-24 ENCOUNTER — Telehealth (HOSPITAL_COMMUNITY): Payer: Self-pay | Admitting: Family Medicine

## 2017-04-24 NOTE — Telephone Encounter (Signed)
Returned patient's call and he is complaining of having diarrhea for the past 2 days.  He has seen Amy Esterwood regarding this and the medication she gave him has ran out.  He is scheduled for [facility] tomorrow. I advised him to drink plenty of fluids today and if he feels worse or not better by 3:30, to call us and reschedule.  If we do not hear from him, then he will continue as scheduled.  All questions and concerns were answered.

## 2017-04-25 ENCOUNTER — Encounter: Payer: Medicare Other | Admitting: Gastroenterology

## 2017-04-25 ENCOUNTER — Ambulatory Visit (AMBULATORY_SURGERY_CENTER): Payer: Medicare Other | Admitting: Gastroenterology

## 2017-04-25 ENCOUNTER — Encounter: Payer: Self-pay | Admitting: Gastroenterology

## 2017-04-25 VITALS — BP 139/68 | HR 71 | Temp 98.2°F | Resp 16 | Ht 67.0 in | Wt 271.0 lb

## 2017-04-25 DIAGNOSIS — K219 Gastro-esophageal reflux disease without esophagitis: Secondary | ICD-10-CM | POA: Diagnosis not present

## 2017-04-25 DIAGNOSIS — D124 Benign neoplasm of descending colon: Secondary | ICD-10-CM

## 2017-04-25 DIAGNOSIS — Z8673 Personal history of transient ischemic attack (TIA), and cerebral infarction without residual deficits: Secondary | ICD-10-CM | POA: Diagnosis not present

## 2017-04-25 DIAGNOSIS — Z86010 Personal history of colonic polyps: Secondary | ICD-10-CM | POA: Diagnosis present

## 2017-04-25 DIAGNOSIS — I10 Essential (primary) hypertension: Secondary | ICD-10-CM | POA: Diagnosis not present

## 2017-04-25 DIAGNOSIS — Z951 Presence of aortocoronary bypass graft: Secondary | ICD-10-CM | POA: Diagnosis not present

## 2017-04-25 DIAGNOSIS — E119 Type 2 diabetes mellitus without complications: Secondary | ICD-10-CM | POA: Diagnosis not present

## 2017-04-25 DIAGNOSIS — D126 Benign neoplasm of colon, unspecified: Secondary | ICD-10-CM

## 2017-04-25 DIAGNOSIS — D123 Benign neoplasm of transverse colon: Secondary | ICD-10-CM | POA: Diagnosis not present

## 2017-04-25 DIAGNOSIS — G4733 Obstructive sleep apnea (adult) (pediatric): Secondary | ICD-10-CM | POA: Diagnosis not present

## 2017-04-25 DIAGNOSIS — I2510 Atherosclerotic heart disease of native coronary artery without angina pectoris: Secondary | ICD-10-CM | POA: Diagnosis not present

## 2017-04-25 DIAGNOSIS — Z860101 Personal history of adenomatous and serrated colon polyps: Secondary | ICD-10-CM

## 2017-04-25 DIAGNOSIS — K635 Polyp of colon: Secondary | ICD-10-CM | POA: Diagnosis not present

## 2017-04-25 MED ORDER — SODIUM CHLORIDE 0.9 % IV SOLN: 500.0000 mL | INTRAVENOUS | Status: DC

## 2017-04-25 NOTE — Progress Notes (Signed)
Report to PACU, RN, vss, BBS= Clear.  

## 2017-04-25 NOTE — Progress Notes (Signed)
Called to room to assist during endoscopic procedure.  Patient ID and intended procedure confirmed with present staff. Received instructions for my participation in the procedure from the performing physician.  

## 2017-04-25 NOTE — Progress Notes (Deleted)
Spontaneous respirations throughout. VSS. Resting comfortably. To PACU on room air. Report to  Penny RN.  

## 2017-04-25 NOTE — Op Note (Signed)
[facility] Patient Name: Edward [name] Procedure Date: 04/25/2017 2:02 PM MRN: [id number] Endoscopist: Ladene Artist , MD Age: 76 Referring MD:  Date of Birth: Jan 24, 1941 Gender: Male Account #: [id number] Procedure:                Colonoscopy Indications:              Surveillance: Personal history of adenomatous                            polyps on last colonoscopy 5 years ago Medicines:                Monitored Anesthesia Care Procedure:                Pre-Anesthesia Assessment:                           - Prior to the procedure, a History and Physical                            was performed, and patient medications and                            allergies were reviewed. The patient's tolerance of                            previous anesthesia was also reviewed. The risks                            and benefits of the procedure and the sedation                            options and risks were discussed with the patient.                            All questions were answered, and informed consent                            was obtained. Prior Anticoagulants: The patient has                            taken no previous anticoagulant or antiplatelet                            agents. ASA Grade Assessment: III - A patient with                            severe systemic disease. After reviewing the risks                            and benefits, the patient was deemed in                            satisfactory condition to undergo the procedure.  After obtaining informed consent, the colonoscope                            was passed under direct vision. Throughout the                            procedure, the patient's blood pressure, pulse, and                            oxygen saturations were monitored continuously. The                            Model PCF-[id number]) scope was introduced                            through the anus and  advanced to the the cecum,                            identified by appendiceal orifice and ileocecal                            valve. The ileocecal valve, appendiceal orifice,                            and rectum were photographed. The quality of the                            bowel preparation was adequate. The colonoscopy was                            performed without difficulty. The patient tolerated                            the procedure well. Scope In: 2:10:34 PM Scope Out: 2:20:07 PM Scope Withdrawal Time: 0 hours 8 minutes 41 seconds  Total Procedure Duration: 0 hours 9 minutes 33 seconds  Findings:                 The perianal and digital rectal examinations were                            normal.                           Three sessile polyps were found in the descending                            colon (2) and hepatic flexure (1). The polyps were                            6 to 7 mm in size. These polyps were removed with a                            cold snare. Resection and retrieval were complete.  Scattered small-mouthed diverticula were found in                            the transverse colon, ascending colon and cecum.                           Internal hemorrhoids were found during                            retroflexion. The hemorrhoids were small and Grade                            I (internal hemorrhoids that do not prolapse).                           The exam was otherwise without abnormality on                            direct and retroflexion views. Complications:            No immediate complications. Estimated blood loss:                            None. Estimated Blood Loss:     Estimated blood loss: none. Impression:               - Three 6 to 7 mm polyps in the descending colon                            and at the hepatic flexure, removed with a cold                            snare. Resected and retrieved.                            - Diverticulosis in the transverse colon, in the                            ascending colon and in the cecum.                           - Internal hemorrhoids.                           - The examination was otherwise normal on direct                            and retroflexion views. Recommendation:           - Patient has a contact number available for                            emergencies. The signs and symptoms of potential                            delayed complications were discussed with the  patient. Return to normal activities tomorrow.                            Written discharge instructions were provided to the                            patient.                           - Resume previous diet.                           - Continue present medications.                           - Await pathology results.                           - No repeat colonoscopy due to age. Ladene Artist, MD 04/25/2017 2:29:02 PM This report has been signed electronically.

## 2017-04-25 NOTE — Patient Instructions (Signed)
Impression/Recommendations:  Polyp handout given to patient. Diverticulosis handout given to patient. Hemorrhoid handout given to patient.  Resume previous diet. Continue present medications. Await pathology results.  YOU HAD AN ENDOSCOPIC PROCEDURE TODAY AT [facility]:   Refer to the procedure report that was given to you for any specific questions about what was found during the examination.  If the procedure report does not answer your questions, please call your gastroenterologist to clarify.  If you requested that your care partner not be given the details of your procedure findings, then the procedure report has been included in a sealed envelope for you to review at your convenience later.  YOU SHOULD EXPECT: Some feelings of bloating in the abdomen. Passage of more gas than usual.  Walking can help get rid of the air that was put into your GI tract during the procedure and reduce the bloating. If you had a lower endoscopy (such as a colonoscopy or flexible sigmoidoscopy) you may notice spotting of blood in your stool or on the toilet paper. If you underwent a bowel prep for your procedure, you may not have a normal bowel movement for a few days.  Please Note:  You might notice some irritation and congestion in your nose or some drainage.  This is from the oxygen used during your procedure.  There is no need for concern and it should clear up in a day or so.  SYMPTOMS TO REPORT IMMEDIATELY:   Following lower endoscopy (colonoscopy or flexible sigmoidoscopy):  Excessive amounts of blood in the stool  Significant tenderness or worsening of abdominal pains  Swelling of the abdomen that is new, acute  Fever of 100F or higher   Following upper endoscopy (EGD)  Vomiting of blood or coffee ground material  New chest pain or pain under the shoulder blades  Painful or persistently difficult swallowing  New shortness of breath  Fever of 100F or higher  Black,  tarry-looking stools  For urgent or emergent issues, a gastroenterologist can be reached at any hour by calling [phone].   DIET:  We do recommend a small meal at first, but then you may proceed to your regular diet.  Drink plenty of fluids but you should avoid alcoholic beverages for 24 hours.  ACTIVITY:  You should plan to take it easy for the rest of today and you should NOT DRIVE or use heavy machinery until tomorrow (because of the sedation medicines used during the test).    FOLLOW UP: Our staff will call the number listed on your records the next business day following your procedure to check on you and address any questions or concerns that you may have regarding the information given to you following your procedure. If we do not reach you, we will leave a message.  However, if you are feeling well and you are not experiencing any problems, there is no need to return our call.  We will assume that you have returned to your regular daily activities without incident.  If any biopsies were taken you will be contacted by phone or by letter within the next 1-3 weeks.  Please call us at [phone] if you have not heard about the biopsies in 3 weeks.    SIGNATURES/CONFIDENTIALITY: You and/or your care partner have signed paperwork which will be entered into your electronic medical record.  These signatures attest to the fact that that the information above on your After Visit Summary has been reviewed and is understood.  Full responsibility of the confidentiality of this discharge information lies with you and/or your care-partner.

## 2017-04-25 NOTE — Op Note (Signed)
[facility] Patient Name: Zavion Sleight Procedure Date: 04/25/2017 2:01 PM MRN: [id number] Endoscopist: Ladene Artist , MD Age: 76 Referring MD:  Date of Birth: [birth year] Gender: Male Account #: [id number] Procedure:                Upper GI endoscopy Indications:              Screening for Barrett's esophagus in patient at                            risk for this condition, Gastroesophageal reflux                            disease Medicines:                Monitored Anesthesia Care Procedure:                Pre-Anesthesia Assessment:                           - Prior to the procedure, a History and Physical                            was performed, and patient medications and                            allergies were reviewed. The patient's tolerance of                            previous anesthesia was also reviewed. The risks                            and benefits of the procedure and the sedation                            options and risks were discussed with the patient.                            All questions were answered, and informed consent                            was obtained. Prior Anticoagulants: The patient has                            taken no previous anticoagulant or antiplatelet                            agents. ASA Grade Assessment: III - A patient with                            severe systemic disease. After reviewing the risks                            and benefits, the patient was deemed in  satisfactory condition to undergo the procedure.                           After obtaining informed consent, the endoscope was                            passed under direct vision. Throughout the                            procedure, the patient's blood pressure, pulse, and                            oxygen saturations were monitored continuously. The                            Model GIF-[id number]) scope was introduced                             through the mouth, and advanced to the second part                            of duodenum. The upper GI endoscopy was                            accomplished without difficulty. The patient                            tolerated the procedure well. Scope In: Scope Out: Findings:                 The esophagus was normal.                           The stomach was normal.                           The examined duodenum was normal.                           The cardia and gastric fundus were normal on                            retroflexion. Complications:            No immediate complications. Estimated Blood Loss:     Estimated blood loss: none. Impression:               - Normal esophagus.                           - Normal stomach.                           - Normal examined duodenum.                           - No specimens collected. Recommendation:           - Patient  has a contact number available for                            emergencies. The signs and symptoms of potential                            delayed complications were discussed with the                            patient. Return to normal activities tomorrow.                            Written discharge instructions were provided to the                            patient.                           - Resume previous diet.                           - Continue present medications. Ladene Artist, MD 04/25/2017 2:30:36 PM This report has been signed electronically.

## 2017-04-25 NOTE — Progress Notes (Signed)
Pt's states no medical or surgical changes since previsit or office visit. 

## 2017-04-26 ENCOUNTER — Telehealth: Payer: Self-pay

## 2017-04-26 NOTE — Telephone Encounter (Signed)
Left message on answering machine. 

## 2017-04-28 ENCOUNTER — Encounter (HOSPITAL_COMMUNITY): Payer: Medicare Other

## 2017-05-01 ENCOUNTER — Encounter (HOSPITAL_COMMUNITY): Payer: Medicare Other

## 2017-05-03 ENCOUNTER — Other Ambulatory Visit: Payer: Self-pay | Admitting: Family Medicine

## 2017-05-05 ENCOUNTER — Encounter (HOSPITAL_COMMUNITY): Payer: Medicare Other

## 2017-05-05 ENCOUNTER — Telehealth: Payer: Self-pay | Admitting: Interventional Cardiology

## 2017-05-05 MED ORDER — AMLODIPINE BESYLATE 5 MG PO TABS: 5.0000 mg | ORAL_TABLET | Freq: Every day | ORAL | 3 refills | Status: DC

## 2017-05-05 NOTE — Telephone Encounter (Signed)
New message   Pt c/o swelling: STAT is pt has developed SOB within 24 hours  1. How long have you been experiencing swelling? Since last week  2. Where is the swelling located? Feet - cannot put shoes on  3.  Are you currently taking a "fluid pill"? yes  4.  Are you currently SOB? no  5.  Have you traveled recently? no

## 2017-05-05 NOTE — Telephone Encounter (Signed)
Spoke with pt and he states that he has had swelling in BLE for about a week now to the point where he can't get his shoes on.  Has missed cardiac rehab this week due to swelling.  Does not have compression stockings at this time but will have his brother take him to get some.  Swelling improves with elevation.  Denies pain or redness to area.  Denies diet changes.  Gets slightly winded with exertion but resolves quickly.  Went swimming yesterday and became a little winded.  Pt has been taking 2 tabs of Furosemide 40mg  since Sunday on his own.  Spoke with Dr. Tamala Julian and he said to have pt go back to taking 1 tablet of Furosemide 40mg  each day and decrease Amlodipine to 5mg  QD and follow up in 2-4 wks.  Spoke with pt and went over recommendations.  Scheduled pt to see Dr. Tamala Julian 8/7.  Advised pt to monitor weight and BP and when appropriate to call with significant changes in either.  Pt verbalized understanding and was appreciative for assistance.

## 2017-05-07 ENCOUNTER — Encounter: Payer: Self-pay | Admitting: Gastroenterology

## 2017-05-08 ENCOUNTER — Encounter (HOSPITAL_COMMUNITY): Payer: Medicare Other

## 2017-05-12 ENCOUNTER — Encounter (HOSPITAL_COMMUNITY): Payer: Medicare Other

## 2017-05-15 ENCOUNTER — Telehealth: Payer: Self-pay | Admitting: Gastroenterology

## 2017-05-15 ENCOUNTER — Encounter (HOSPITAL_COMMUNITY): Payer: Medicare Other

## 2017-05-15 ENCOUNTER — Telehealth (HOSPITAL_COMMUNITY): Payer: Self-pay | Admitting: *Deleted

## 2017-05-15 NOTE — Addendum Note (Signed)
Encounter addended by: Meta Hatchet on: 05/15/2017  4:45 PM<BR>    Actions taken: Visit Navigator Flowsheet section accepted

## 2017-05-15 NOTE — Telephone Encounter (Signed)
I reviewed the pathology report and the letter with the patient all of his questions were answered. He will call back for any additional questions or concerns. He is having continued diarrhea and would like an appt to discuss.  He wants to wait until August.  He is scheduled for 06/19/17

## 2017-05-16 ENCOUNTER — Encounter: Payer: Self-pay | Admitting: Internal Medicine

## 2017-05-16 ENCOUNTER — Encounter (HOSPITAL_COMMUNITY): Payer: Self-pay | Admitting: *Deleted

## 2017-05-16 ENCOUNTER — Telehealth (HOSPITAL_COMMUNITY): Payer: Self-pay | Admitting: *Deleted

## 2017-05-16 ENCOUNTER — Ambulatory Visit (INDEPENDENT_AMBULATORY_CARE_PROVIDER_SITE_OTHER): Payer: Medicare Other | Admitting: Internal Medicine

## 2017-05-16 VITALS — BP 118/78 | HR 72 | Wt 281.0 lb

## 2017-05-16 DIAGNOSIS — E1159 Type 2 diabetes mellitus with other circulatory complications: Secondary | ICD-10-CM

## 2017-05-16 DIAGNOSIS — Z794 Long term (current) use of insulin: Secondary | ICD-10-CM

## 2017-05-16 DIAGNOSIS — I25110 Atherosclerotic heart disease of native coronary artery with unstable angina pectoris: Secondary | ICD-10-CM

## 2017-05-16 DIAGNOSIS — Z951 Presence of aortocoronary bypass graft: Secondary | ICD-10-CM

## 2017-05-16 LAB — GLUCOSE, POCT (MANUAL RESULT ENTRY): POC GLUCOSE: 156 mg/dL — AB (ref 70–99)

## 2017-05-16 MED ORDER — INSULIN NPH ISOPHANE & REGULAR (70-30) 100 UNIT/ML ~~LOC~~ SUSP: SUBCUTANEOUS | 11 refills | Status: DC

## 2017-05-16 NOTE — Progress Notes (Signed)
Patient ID: Edward [name], male   DOB: 18-Aug-1941, 76 y.o.   MRN: [id number]  HPI: Edward [name] is a 76 y.o.-year-old male, returning for f/u for DM2,  dx 1987, insulin-dependent since 1989, uncontrolled, with complications (CAD - s/p 2 AMIs, PN). He moved from [place] in 12/2013. Last visit 4 mo ago.  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 He is in cardiac rehabilitation >> but not in last 2 weeks b/c swelling.  Latest HbA1c levels: Lab Results  Component Value Date   HGBA1C 7.7 12/16/2016   HGBA1C 9.4 09/13/2016   HGBA1C 9.4 06/13/2016  09/13/2016: HbA1c calculated from fructosamine is 6.86%   He was on:  Insulin  Before breakfast  Before lunch  Before dinner   Novolin 70/30 (vial) 70 (instead of 40) 70 70 (instead of 60)  Novolog He was on Metformin >> N/V/D. U500 insulin was not covered for him despite multiple requests and PAs to the insurance  Now on: - Levemir 70 units at night (He did not switch to 45 units twice a day as suggested at last visit) - Novolog 15 min before meals: - 30 units before a smaller meal - 40 units before a larger meal  Pt checks his sugars 3x a day >> higher - he believes that this is because he is not responding too well to Levemir: - am:  132, 140-160 >> 110, 122-160, 182 >> 115-180 >> w/o exercise: 280-400 (!) - 2h after b'fast:  120-125 >> n/c>> 52 (took Levemir 2x a day) >> 150 x1 - before lunch: 130-140 >> 140-180 >> 130s >> 210-250 >> 180-200s - 2h after lunch:  198-309 >> 121-254 >> 170-180s >> n/c  - before dinner:  180-190 >> 100-110 >> 170s >> 142-180 >> 200-300 - 2h after dinner: n/c >> 301-379>> 99, 128, 368 >> n/c >> 280 >> n/c - bedtime:n/c >> 160s >> n/c >> 140-165 >> n/c  - nighttime:52 x2 >> 83 >> occas. 87 >> 80s >> 115 >>  Lowest sugar was 60 >> 60 x1 >> 80s >> 52 x1 >> 75 (after increased exercise); he has hypoglycemia awareness at 100. Highest sugar was 250 >> 210 >> 280 >> 400s.  Has a One Touch Ultra meter.   - He has CKD, last  BUN/creatinine:  Lab Results  Component Value Date   BUN 13 03/20/2017   CREATININE 1.07 03/20/2017  Pt is seen by nephrology b/c proteinuria (555 mg/24h). On Avapro by Dr. Hollie [name]. - He has hyperlipidemia. Last set of lipids: Lab Results  Component Value Date   CHOL 166 02/06/2017   HDL 30 (L) 02/06/2017   LDLCALC 107 (H) 02/06/2017   LDLDIRECT 144.0 07/08/2015   TRIG 146 02/06/2017   CHOLHDL 5.5 02/06/2017  He had leg cramps from Lipitor, now back on it.  - last eye exam was in 09/2016 (Dr Edward [name]). + DR. Had cataracts sx.  - + numbness and tingling in his feet. He saw podiatrist (05/2015) >> has PN. On ASA 81.  He decreased his Amlodipine dose to 5 mg 2/2 LE swelling.   ROS: Constitutional: + weight gain/no weight loss, + fatigue, no subjective hyperthermia, no subjective hypothermia, + nocturia Eyes: no blurry vision, no xerophthalmia ENT: no sore throat, no nodules palpated in throat, no dysphagia, no odynophagia, + hoarseness Cardiovascular: no CP/no SOB/no palpitations/+ leg swelling Respiratory: no cough/no SOB/+ wheezing Gastrointestinal: no N/no V/+ D/no C/no acid reflux Musculoskeletal: + muscle aches/no joint aches Skin: no rashes, no hair  loss Neurological: no tremors/no numbness/no tingling/no dizziness  I reviewed pt's medications, allergies, PMH, social hx, family hx, and changes were documented in the history of present illness. Otherwise, unchanged from my initial visit note.  PE: BP 118/78 (BP Location: Left Arm, Patient Position: Sitting)   Pulse 72   Wt 281 lb (127.5 kg)   SpO2 98%   BMI 44.01 kg/m  Body mass index is 44.01 kg/m. Wt Readings from Last 3 Encounters:  05/16/17 281 lb (127.5 kg)  04/25/17 271 lb (122.9 kg)  04/03/17 276 lb 4 oz (125.3 kg)   Constitutional: obese, in NAD Eyes: PERRLA, EOMI, no exophthalmos ENT: moist mucous membranes, no thyromegaly, no cervical lymphadenopathy Cardiovascular: RRR, No MRG, + B pitting leg swelling  L>R Respiratory: CTA B Gastrointestinal: abdomen soft, NT, ND, BS+ Musculoskeletal: no deformities, strength intact in all 4 Skin: moist, warm, no rashes Neurological: no tremor with outstretched hands, DTR normal in all 4  ASSESSMENT: 1. DM2, insulin-dependent, uncontrolled, with complications - CAD H/o steroid inj's  Cardiologist: Dr Edward Julian. Pulmonologist: Dr Edward [name]  PLAN:  1. Patient with long-standing, uncontrolled diabetes, previously on U500 insulin then on Novolin 70/30, and now on Levemir and NovoLog. His sugars are definitely higher at this visit. He feels that he is not responding well to Levemir, however, I feel this is mostly a dose problem. We can change back to Novolin 70/30, per his request, though. We have to be careful with morning 70/30 dosing since he will be exercising in the morning (now less active until leg swelling improves) - I advised him to bring me his sugar log at next visit, but also call me about his sugars in one week. - I suggested to:  Patient Instructions   Please stop Levemir and Novolog.  Start Novolin 70/30: Insulin  Before breakfast  Before lunch  Before dinner   Novolin 70/30 (vial) 40 units if you do not exercise 30 units if you exercise 20 60   Please let me know about the sugars in 1 week.  Please return in 1.5 months with your sugar log.   - today will check a fructosamine level - continue checking sugars at different times of the day - check 3-4x a day, rotating checks - advised for yearly eye exams >> he is UTD - Return to clinic in 1.5 mo with sugar log    Office Visit on 05/16/2017  Component Date Value Ref Range Status  . Fructosamine 05/16/2017 304* 190 - 270 umol/L Final  . POC Glucose 05/16/2017 156* 70 - 99 mg/dl Final   HbA1c calculated from the fructosamine is 6.77%, which I do not believe is correct based on his CBGs in his log... Will continue to monitor his sugars in log.  Edward Kingdom, MD PhD [facility]  Endocrinology

## 2017-05-16 NOTE — Progress Notes (Signed)
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  acetaminophen (TYLENOL) 500 MG tablet, Take 2 tablets (1,000 mg total) by mouth every 6 (six) hours as needed. (Patient not taking: Reported on 04/25/2017), Disp: 30 tablet, Rfl: 0 .  amLODipine (NORVASC) 5 MG tablet, Take 1 tablet (5 mg total) by mouth daily., Disp: 90 tablet, Rfl: 3 .  aspirin EC 81 MG tablet, Take 81 mg by mouth daily., Disp: , Rfl:  .  atorvastatin (LIPITOR) 80 MG tablet, Take 80 mg by mouth daily., Disp: , Rfl:  .  clonazePAM (KLONOPIN) 0.5 MG tablet, Take 1 tablet (0.5 mg total) by mouth 2 (two) times daily as needed (tremors)., Disp: 45 tablet, Rfl: 3 .  clopidogrel (PLAVIX) 75 MG tablet, Take 1 tablet (75 mg total) by mouth daily., Disp: 90 tablet, Rfl: 3 .  diclofenac sodium (VOLTAREN) 1 % GEL, Apply 2 g topically 4 (four) times daily as needed (for pain). , Disp: , Rfl:  .  furosemide (LASIX) 40 MG tablet, take 1 tablet by mouth once daily, Disp: 90 tablet, Rfl: 1 .  gabapentin (NEURONTIN) 300 MG capsule, Take 1 capsule (300 mg total) by mouth 2 (two) times daily., Disp: , Rfl:  .  insulin NPH-regular Human (NOVOLIN 70/30) (70-30) 100 UNIT/ML injection, Use up to 120 units a day under skin, Disp: 40 mL, Rfl: 11 .  losartan (COZAAR) 25 MG tablet, Take 1 tablet (25 mg total) by mouth daily., Disp: 30 tablet, Rfl: 1 .  Polyvinyl Alcohol-Povidone (REFRESH OP), Place 1 drop into both eyes daily as  needed (for dry eyes). Reported on 12/25/2015, Disp: , Rfl:  .  potassium chloride SA (K-DUR,KLOR-CON) 20 MEQ tablet, take 1 tablet by mouth once daily, Disp: 30 tablet, Rfl: 11 .  propranolol (INDERAL) 40 MG tablet, Take 40 mg by mouth 3 (three) times daily., Disp: , Rfl: 0 .  ranitidine (ZANTAC) 150 MG tablet, Take 1 tablet (150 mg total) by mouth at bedtime. (Patient not taking: Reported on 04/10/2017), Disp: 30 tablet, Rfl: 11 .  VENTOLIN HFA 108 (90 Base) MCG/ACT inhaler, inhale 2 puffs by mouth every 6 hours if needed for wheezing or shortness of breath, Disp: 18 g, Rfl: 1  Current Facility-Administered Medications:  .  0.9 %  sodium chloride infusion, 500 mL, Intravenous, Continuous, Ladene Artist, MD  Past Medical History: Past Medical History:  Diagnosis Date  . Adenomatous colon polyp 04/2011  . CAD (coronary artery disease)    a. 01/2015 DES to LAD  b. 12/2015: [place] 85% oLCx lesion--> rx therapy.  . Chronic diastolic CHF (congestive heart failure) ([place])   . CKD (chronic kidney disease), stage II    a. probable CKD II-III with baseline CR 1.1-1.3.  . DM type 2 (diabetes mellitus, type 2), insulin dependent 01/29/2014   fasting cbg 50-120 with new regimen  . Dyslipidemia, goal LDL below 70 01/29/2014  . Enlarged prostate   .  Essential tremor    a. on proprnolol  . GERD (gastroesophageal reflux disease)   . Hypertension   . Internal hemorrhoid   . Sleep apnea    does not use cpap (06/04/2015)  . TIA (transient ischemic attack) 2002    Tobacco Use: History  Smoking Status  . Never Smoker  Smokeless Tobacco  . Never Used    Labs: Recent Review Flowsheet Data    Labs for ITP Cardiac and Pulmonary Rehab Latest Ref Rng & Units 11/09/2016 11/10/2016 12/16/2016 01/02/2017 02/06/2017   Cholestrol 0 - 200 mg/dL - - - 143 166   LDLCALC 0 - 99 mg/dL - - - 88 107(H)   LDLDIRECT mg/dL - - - - -   HDL >40 mg/dL - - - 37.70(L) 30(L)   Trlycerides <150 mg/dL - - - 87.0 146   Hemoglobin A1c -  - - 7.7  - -   PHART 7.350 - 7.450 7.341(L) - - - -   PCO2ART 32.0 - 48.0 mmHg 45.1 - - - -   HCO3 20.0 - 28.0 mmol/L 24.2 - - - -   TCO2 0 - 100 mmol/L 26 24 - - -   ACIDBASEDEF 0.0 - 2.0 mmol/L 2.0 - - - -   O2SAT % 97.0 - - - -      Capillary Blood Glucose: Lab Results  Component Value Date   GLUCAP 214 (H) 04/10/2017   GLUCAP 130 (H) 03/31/2017   GLUCAP 112 (H) 03/10/2017   GLUCAP 117 (H) 03/06/2017   GLUCAP 115 (H) 03/06/2017     Exercise Target Goals:    Exercise Program Goal: Individual exercise prescription set with THRR, safety & activity barriers. Participant demonstrates ability to understand and report RPE using BORG scale, to self-measure pulse accurately, and to acknowledge the importance of the exercise prescription.  Exercise Prescription Goal: Starting with aerobic activity 30 plus minutes a day, 3 days per week for initial exercise prescription. Provide home exercise prescription and guidelines that participant acknowledges understanding prior to discharge.  Activity Barriers & Risk Stratification:   6 Minute Walk:   Oxygen Initial Assessment:   Oxygen Re-Evaluation:   Oxygen Discharge (Final Oxygen Re-Evaluation):   Initial Exercise Prescription:   Perform Capillary Blood Glucose checks as needed.  Exercise Prescription Changes:     Exercise Prescription Changes    Row Name 04/19/17 1600             Response to Exercise   Blood Pressure (Admit) 138/70       Blood Pressure (Exercise) 158/72       Blood Pressure (Exit) 138/70       Heart Rate (Admit) 81 bpm       Heart Rate (Exercise) 106 bpm       Heart Rate (Exit) 81 bpm       Rating of Perceived Exertion (Exercise) 14       Duration Progress to 45 minutes of aerobic exercise without signs/symptoms of physical distress       Intensity THRR unchanged         Progression   Progression Continue to progress workloads to maintain intensity without signs/symptoms of physical  distress.       Average METs 2.7         Resistance Training   Training Prescription Yes       Weight 2       Reps 10-15         NuStep   Level 2  Minutes 20       METs 2.6         Track   Laps 10       Minutes 10       METs 2.7         Home Exercise Plan   Plans to continue exercise at Home (comment)       Frequency Add 3 additional days to program exercise sessions.          Exercise Comments:     Exercise Comments    Row Name 04/19/17 1621 05/15/17 1645         Exercise Comments Pt's attendance has been inconsistent due to travel for work, but he does well with exercise on the days he is present. Pt has been absent from [facility] for the past month.          Exercise Goals and Review:   Exercise Goals Re-Evaluation :     Exercise Goals Re-Evaluation    Row Name 04/19/17 1619             Exercise Goal Re-Evaluation   Exercise Goals Review Increase Physical Activity;Increase Strenth and Stamina       Comments Pt has missed many sessions due to travel for work, but when he's present he does well with exercise       Expected Outcomes With a more consistent attendance following the exercise prescription, increasing workloads as tolerated, pt will begin to increase exercise tolerance and increase strenght and stamina.            Discharge Exercise Prescription (Final Exercise Prescription Changes):     Exercise Prescription Changes - 04/19/17 1600      Response to Exercise   Blood Pressure (Admit) 138/70   Blood Pressure (Exercise) 158/72   Blood Pressure (Exit) 138/70   Heart Rate (Admit) 81 bpm   Heart Rate (Exercise) 106 bpm   Heart Rate (Exit) 81 bpm   Rating of Perceived Exertion (Exercise) 14   Duration Progress to 45 minutes of aerobic exercise without signs/symptoms of physical distress   Intensity THRR unchanged     Progression   Progression Continue to progress workloads to maintain intensity without signs/symptoms of physical distress.    Average METs 2.7     Resistance Training   Training Prescription Yes   Weight 2   Reps 10-15     NuStep   Level 2   Minutes 20   METs 2.6     Track   Laps 10   Minutes 10   METs 2.7     Home Exercise Plan   Plans to continue exercise at Home (comment)   Frequency Add 3 additional days to program exercise sessions.      Nutrition:  Target Goals: Understanding of nutrition guidelines, daily intake of sodium 1500mg , cholesterol 200mg , calories 30% from fat and 7% or less from saturated fats, daily to have 5 or more servings of fruits and vegetables.  Biometrics:    Nutrition Therapy Plan and Nutrition Goals:   Nutrition Discharge: Nutrition Scores:   Nutrition Goals Re-Evaluation:   Nutrition Goals Re-Evaluation:   Nutrition Goals Discharge (Final Nutrition Goals Re-Evaluation):   Psychosocial: Target Goals: Acknowledge presence or absence of significant depression and/or stress, maximize coping skills, provide positive support system. Participant is able to verbalize types and ability to use techniques and skills needed for reducing stress and depression.  Initial Review & Psychosocial Screening:   Quality of Life Scores:  PHQ-9: Recent Review Flowsheet Data    Depression screen [facility] 2/9 02/20/2017 12/08/2015 12/07/2015 06/04/2015   Decreased Interest 1 0 1 0   Down, Depressed, Hopeless 0 0 1 0   PHQ - 2 Score 1 0 2 0   Altered sleeping - - 0 -   Tired, decreased energy - - 3 -   Change in appetite - - 1 -   Feeling bad or failure about yourself  - - 0 -   Trouble concentrating - - 1 -   Moving slowly or fidgety/restless - - 0 -   Suicidal thoughts - - 0 -   PHQ-9 Score - - 7 -   Difficult doing work/chores - - Somewhat difficult -     Interpretation of Total Score  Total Score Depression Severity:  1-4 = Minimal depression, 5-9 = Mild depression, 10-14 = Moderate depression, 15-19 = Moderately severe depression, 20-27 = Severe depression    Psychosocial Evaluation and Intervention:   Psychosocial Re-Evaluation:     Psychosocial Re-Evaluation    Row Name 03/21/17 1215             Psychosocial Re-Evaluation   Comments -  unable to assess. Edward [name]'s last day of exercise was on 03/06/2017.          Psychosocial Discharge (Final Psychosocial Re-Evaluation):     Psychosocial Re-Evaluation - 03/21/17 1215      Psychosocial Re-Evaluation   Comments --  unable to assess. Edward [name]'s last day of exercise was on 03/06/2017.      Vocational Rehabilitation: Provide vocational rehab assistance to qualifying candidates.   Vocational Rehab Evaluation & Intervention:   Education: Education Goals: Education classes will be provided on a weekly basis, covering required topics. Participant will state understanding/return demonstration of topics presented.  Learning Barriers/Preferences:   Education Topics: Count Your Pulse:  -Group instruction provided by verbal instruction, demonstration, patient participation and written materials to support subject.  Instructors address importance of being able to find your pulse and how to count your pulse when at home without a heart monitor.  Patients get hands on experience counting their pulse with staff help and individually.   Heart Attack, Angina, and Risk Factor Modification:  -Group instruction provided by verbal instruction, video, and written materials to support subject.  Instructors address signs and symptoms of angina and heart attacks.    Also discuss risk factors for heart disease and how to make changes to improve heart health risk factors.   Functional Fitness:  -Group instruction provided by verbal instruction, demonstration, patient participation, and written materials to support subject.  Instructors address safety measures for doing things around the house.  Discuss how to get up and down off the floor, how to pick things up properly, how to safely get out of a  chair without assistance, and balance training.   Meditation and Mindfulness:  -Group instruction provided by verbal instruction, patient participation, and written materials to support subject.  Instructor addresses importance of mindfulness and meditation practice to help reduce stress and improve awareness.  Instructor also leads participants through a meditation exercise.    Stretching for Flexibility and Mobility:  -Group instruction provided by verbal instruction, patient participation, and written materials to support subject.  Instructors lead participants through series of stretches that are designed to increase flexibility thus improving mobility.  These stretches are additional exercise for major muscle groups that are typically performed during regular warm up and cool down.   Hands Only CPR:  -  Group verbal, video, and participation provides a basic overview of AHA guidelines for community CPR. Role-play of emergencies allow participants the opportunity to practice calling for help and chest compression technique with discussion of AED use.   Hypertension: -Group verbal and written instruction that provides a basic overview of hypertension including the most recent diagnostic guidelines, risk factor reduction with self-care instructions and medication management.    Nutrition I class: Heart Healthy Eating:  -Group instruction provided by PowerPoint slides, verbal discussion, and written materials to support subject matter. The instructor gives an explanation and review of the Therapeutic Lifestyle Changes diet recommendations, which includes a discussion on lipid goals, dietary fat, sodium, fiber, plant stanol/sterol esters, sugar, and the components of a well-balanced, healthy diet.   Nutrition II class: Lifestyle Skills:  -Group instruction provided by PowerPoint slides, verbal discussion, and written materials to support subject matter. The instructor gives an explanation and  review of label reading, grocery shopping for heart health, heart healthy recipe modifications, and ways to make healthier choices when eating out.   Diabetes Question & Answer:  -Group instruction provided by PowerPoint slides, verbal discussion, and written materials to support subject matter. The instructor gives an explanation and review of diabetes co-morbidities, pre- and post-prandial blood glucose goals, pre-exercise blood glucose goals, signs, symptoms, and treatment of hypoglycemia and hyperglycemia, and foot care basics.   Diabetes Blitz:  -Group instruction provided by PowerPoint slides, verbal discussion, and written materials to support subject matter. The instructor gives an explanation and review of the physiology behind type 1 and type 2 diabetes, diabetes medications and rational behind using different medications, pre- and post-prandial blood glucose recommendations and Hemoglobin A1c goals, diabetes diet, and exercise including blood glucose guidelines for exercising safely.    Portion Distortion:  -Group instruction provided by PowerPoint slides, verbal discussion, written materials, and food models to support subject matter. The instructor gives an explanation of serving size versus portion size, changes in portions sizes over the last 20 years, and what consists of a serving from each food group.   Stress Management:  -Group instruction provided by verbal instruction, video, and written materials to support subject matter.  Instructors review role of stress in heart disease and how to cope with stress positively.     Exercising on Your Own:  -Group instruction provided by verbal instruction, power point, and written materials to support subject.  Instructors discuss benefits of exercise, components of exercise, frequency and intensity of exercise, and end points for exercise.  Also discuss use of nitroglycerin and activating EMS.  Review options of places to exercise  outside of rehab.  Review guidelines for sex with heart disease.   Cardiac Drugs I:  -Group instruction provided by verbal instruction and written materials to support subject.  Instructor reviews cardiac drug classes: antiplatelets, anticoagulants, beta blockers, and statins.  Instructor discusses reasons, side effects, and lifestyle considerations for each drug class.   Cardiac Drugs II:  -Group instruction provided by verbal instruction and written materials to support subject.  Instructor reviews cardiac drug classes: angiotensin converting enzyme inhibitors (ACE-I), angiotensin II receptor blockers (ARBs), nitrates, and calcium channel blockers.  Instructor discusses reasons, side effects, and lifestyle considerations for each drug class.   Anatomy and Physiology of the Circulatory System:  Group verbal and written instruction and models provide basic cardiac anatomy and physiology, with the coronary electrical and arterial systems. Review of: AMI, Angina, Valve disease, Heart Failure, Peripheral Artery Disease, Cardiac Arrhythmia, Pacemakers, and the ICD.  Other Education:  -Group or individual verbal, written, or video instructions that support the educational goals of the cardiac rehab program.   Knowledge Questionnaire Score:   Core Components/Risk Factors/Patient Goals at Admission:   Core Components/Risk Factors/Patient Goals Review:    Core Components/Risk Factors/Patient Goals at Discharge (Final Review):    ITP Comments:   Comments: Edward [name]'s last day pf exercise was on 04/10/2017. Edward [name] says he has been out due to problems with swelling in his legs. Edward [name] says he plans to return to exercise on 06/02/2017. Edward [name] last day of exercise will be on 06/14/2017.Edward Pall, RN,BSN 05/16/2017 11:24 AM

## 2017-05-16 NOTE — Patient Instructions (Addendum)
Please stop Levemir and Novolog.  Start Novolin 70/30: Insulin  Before breakfast  Before lunch  Before dinner   Novolin 70/30 (vial) 40 units if you do not exercise 30 units if you exercise 20 60   Please let me know about the sugars in 1 week.  Please return in 1.5 months with your sugar log.

## 2017-05-17 DIAGNOSIS — Z961 Presence of intraocular lens: Secondary | ICD-10-CM | POA: Diagnosis not present

## 2017-05-17 DIAGNOSIS — H02423 Myogenic ptosis of bilateral eyelids: Secondary | ICD-10-CM | POA: Diagnosis not present

## 2017-05-17 DIAGNOSIS — E113591 Type 2 diabetes mellitus with proliferative diabetic retinopathy without macular edema, right eye: Secondary | ICD-10-CM | POA: Diagnosis not present

## 2017-05-17 DIAGNOSIS — E113492 Type 2 diabetes mellitus with severe nonproliferative diabetic retinopathy without macular edema, left eye: Secondary | ICD-10-CM | POA: Diagnosis not present

## 2017-05-17 DIAGNOSIS — H40013 Open angle with borderline findings, low risk, bilateral: Secondary | ICD-10-CM | POA: Diagnosis not present

## 2017-05-18 ENCOUNTER — Ambulatory Visit (INDEPENDENT_AMBULATORY_CARE_PROVIDER_SITE_OTHER): Payer: Medicare Other | Admitting: Ophthalmology

## 2017-05-18 DIAGNOSIS — I10 Essential (primary) hypertension: Secondary | ICD-10-CM | POA: Diagnosis not present

## 2017-05-18 DIAGNOSIS — E113591 Type 2 diabetes mellitus with proliferative diabetic retinopathy without macular edema, right eye: Secondary | ICD-10-CM

## 2017-05-18 DIAGNOSIS — H43813 Vitreous degeneration, bilateral: Secondary | ICD-10-CM

## 2017-05-18 DIAGNOSIS — E113392 Type 2 diabetes mellitus with moderate nonproliferative diabetic retinopathy without macular edema, left eye: Secondary | ICD-10-CM

## 2017-05-18 DIAGNOSIS — E11319 Type 2 diabetes mellitus with unspecified diabetic retinopathy without macular edema: Secondary | ICD-10-CM

## 2017-05-18 DIAGNOSIS — D3132 Benign neoplasm of left choroid: Secondary | ICD-10-CM

## 2017-05-18 DIAGNOSIS — H35033 Hypertensive retinopathy, bilateral: Secondary | ICD-10-CM | POA: Diagnosis not present

## 2017-05-18 LAB — FRUCTOSAMINE: Fructosamine: 304 umol/L — ABNORMAL HIGH (ref 190–270)

## 2017-05-18 LAB — HM DIABETES EYE EXAM

## 2017-05-19 ENCOUNTER — Encounter (HOSPITAL_COMMUNITY): Payer: Medicare Other

## 2017-05-22 ENCOUNTER — Emergency Department (HOSPITAL_COMMUNITY): Payer: Medicare Other

## 2017-05-22 ENCOUNTER — Encounter (HOSPITAL_COMMUNITY): Payer: Medicare Other

## 2017-05-22 ENCOUNTER — Encounter (HOSPITAL_COMMUNITY): Payer: Self-pay | Admitting: Emergency Medicine

## 2017-05-22 ENCOUNTER — Emergency Department (HOSPITAL_COMMUNITY)
Admission: EM | Admit: 2017-05-22 | Discharge: 2017-05-22 | Disposition: A | Discharge status: Home / Self Care | Payer: Medicare Other | Attending: Emergency Medicine | Admitting: Emergency Medicine

## 2017-05-22 DIAGNOSIS — R072 Precordial pain: Secondary | ICD-10-CM | POA: Diagnosis not present

## 2017-05-22 DIAGNOSIS — Z951 Presence of aortocoronary bypass graft: Secondary | ICD-10-CM | POA: Diagnosis not present

## 2017-05-22 DIAGNOSIS — N183 Chronic kidney disease, stage 3 unspecified: Secondary | ICD-10-CM | POA: Diagnosis present

## 2017-05-22 DIAGNOSIS — I2510 Atherosclerotic heart disease of native coronary artery without angina pectoris: Secondary | ICD-10-CM | POA: Insufficient documentation

## 2017-05-22 DIAGNOSIS — I5032 Chronic diastolic (congestive) heart failure: Secondary | ICD-10-CM | POA: Diagnosis not present

## 2017-05-22 DIAGNOSIS — N182 Chronic kidney disease, stage 2 (mild): Secondary | ICD-10-CM | POA: Diagnosis not present

## 2017-05-22 DIAGNOSIS — Z7902 Long term (current) use of antithrombotics/antiplatelets: Secondary | ICD-10-CM | POA: Diagnosis not present

## 2017-05-22 DIAGNOSIS — E1122 Type 2 diabetes mellitus with diabetic chronic kidney disease: Secondary | ICD-10-CM | POA: Diagnosis not present

## 2017-05-22 DIAGNOSIS — Z8673 Personal history of transient ischemic attack (TIA), and cerebral infarction without residual deficits: Secondary | ICD-10-CM | POA: Insufficient documentation

## 2017-05-22 DIAGNOSIS — R51 Headache: Secondary | ICD-10-CM | POA: Insufficient documentation

## 2017-05-22 DIAGNOSIS — Z7982 Long term (current) use of aspirin: Secondary | ICD-10-CM | POA: Insufficient documentation

## 2017-05-22 DIAGNOSIS — E785 Hyperlipidemia, unspecified: Secondary | ICD-10-CM | POA: Diagnosis present

## 2017-05-22 DIAGNOSIS — I130 Hypertensive heart and chronic kidney disease with heart failure and stage 1 through stage 4 chronic kidney disease, or unspecified chronic kidney disease: Secondary | ICD-10-CM | POA: Diagnosis not present

## 2017-05-22 DIAGNOSIS — R519 Headache, unspecified: Secondary | ICD-10-CM

## 2017-05-22 DIAGNOSIS — Z79899 Other long term (current) drug therapy: Secondary | ICD-10-CM | POA: Insufficient documentation

## 2017-05-22 DIAGNOSIS — R079 Chest pain, unspecified: Secondary | ICD-10-CM | POA: Diagnosis not present

## 2017-05-22 DIAGNOSIS — G4733 Obstructive sleep apnea (adult) (pediatric): Secondary | ICD-10-CM | POA: Diagnosis present

## 2017-05-22 DIAGNOSIS — G250 Essential tremor: Secondary | ICD-10-CM | POA: Diagnosis present

## 2017-05-22 DIAGNOSIS — E119 Type 2 diabetes mellitus without complications: Secondary | ICD-10-CM

## 2017-05-22 DIAGNOSIS — I10 Essential (primary) hypertension: Secondary | ICD-10-CM | POA: Diagnosis present

## 2017-05-22 LAB — I-STAT TROPONIN, ED
TROPONIN I, POC: 0.01 ng/mL (ref 0.00–0.08)
TROPONIN I, POC: 0 ng/mL (ref 0.00–0.08)

## 2017-05-22 LAB — CBC
HCT: 37.2 % — ABNORMAL LOW (ref 39.0–52.0)
Hemoglobin: 12.2 g/dL — ABNORMAL LOW (ref 13.0–17.0)
MCH: 27.5 pg (ref 26.0–34.0)
MCHC: 32.8 g/dL (ref 30.0–36.0)
MCV: 83.8 fL (ref 78.0–100.0)
PLATELETS: 228 10*3/uL (ref 150–400)
RBC: 4.44 MIL/uL (ref 4.22–5.81)
RDW: 13.4 % (ref 11.5–15.5)
WBC: 6.3 10*3/uL (ref 4.0–10.5)

## 2017-05-22 LAB — BASIC METABOLIC PANEL
Anion gap: 8 (ref 5–15)
BUN: 15 mg/dL (ref 6–20)
CHLORIDE: 100 mmol/L — AB (ref 101–111)
CO2: 28 mmol/L (ref 22–32)
CREATININE: 1.17 mg/dL (ref 0.61–1.24)
Calcium: 8.8 mg/dL — ABNORMAL LOW (ref 8.9–10.3)
GFR calc non Af Amer: 60 mL/min — ABNORMAL LOW (ref >60)
Glucose, Bld: 306 mg/dL — ABNORMAL HIGH (ref 65–99)
Potassium: 4 mmol/L (ref 3.5–5.1)
SODIUM: 136 mmol/L (ref 135–145)

## 2017-05-22 MED ORDER — FENTANYL CITRATE (PF) 100 MCG/2ML IJ SOLN
50.0000 ug | Freq: Once | INTRAMUSCULAR | Status: AC
  Administered 2017-05-22: 50 ug via INTRAVENOUS
  Filled 2017-05-22: qty 2

## 2017-05-22 MED ORDER — DIPHENHYDRAMINE HCL 50 MG/ML IJ SOLN
12.5000 mg | Freq: Once | INTRAMUSCULAR | Status: AC
Start: 2017-05-22 — End: 2017-05-22
  Administered 2017-05-22: 12.5 mg via INTRAVENOUS
  Filled 2017-05-22: qty 1

## 2017-05-22 MED ORDER — BUTALBITAL-APAP-CAFFEINE 50-325-40 MG PO TABS: 1.0000 | ORAL_TABLET | Freq: Four times a day (QID) | ORAL | 0 refills | Status: DC | PRN

## 2017-05-22 MED ORDER — ONDANSETRON HCL 4 MG/2ML IJ SOLN
4.0000 mg | Freq: Once | INTRAMUSCULAR | Status: AC
Start: 2017-05-22 — End: 2017-05-22
  Administered 2017-05-22: 4 mg via INTRAVENOUS
  Filled 2017-05-22: qty 2

## 2017-05-22 MED ORDER — METOCLOPRAMIDE HCL 5 MG/ML IJ SOLN
5.0000 mg | Freq: Once | INTRAMUSCULAR | Status: AC
Start: 2017-05-22 — End: 2017-05-22
  Administered 2017-05-22: 5 mg via INTRAVENOUS
  Filled 2017-05-22: qty 2

## 2017-05-22 NOTE — Discharge Instructions (Signed)
Please read and follow all provided instructions.  Your diagnoses today include:  1. Acute nonintractable headache, unspecified headache type   2. Precordial pain     Tests performed today include:  An EKG of your heart  A chest x-ray  Cardiac enzymes - a blood test for heart muscle damage  Blood counts and electrolytes  CT of your brain - no problems which would cause headache  Vital signs. See below for your results today.   Medications prescribed:   Fioricet - medication for headache  Take this medication only under direct supervision because it can make you dizzy and causing you to fall.  Take any prescribed medications only as directed.  Follow-up instructions: Please follow-up with your primary care provider as soon as you can for further evaluation of your symptoms.   Return instructions:  SEEK IMMEDIATE MEDICAL ATTENTION IF:  You have severe chest pain, especially if the pain is crushing or pressure-like and spreads to the arms, back, neck, or jaw, or if you have sweating, nausea (feeling sick to your stomach), or shortness of breath. THIS IS AN EMERGENCY. Don't wait to see if the pain will go away. Get medical help at once. Call 911 or 0 (operator). DO NOT drive yourself to the hospital.   Your chest pain gets worse and does not go away with rest.   You have an attack of chest pain lasting longer than usual, despite rest and treatment with the medications your caregiver has prescribed.   You wake from sleep with chest pain or shortness of breath.  You feel dizzy or faint.  You have chest pain not typical of your usual pain for which you originally saw your caregiver.   You have any other emergent concerns regarding your health.  Additional Information: Chest pain comes from many different causes. Your caregiver has diagnosed you as having chest pain that is not specific for one problem, but does not require admission.  You are at low risk for an acute heart  condition or other serious illness.   Your vital signs today were: BP (!) 122/56    Pulse 64    Temp 98.6 F (37 C) (Oral)    Resp 17    SpO2 98%  If your blood pressure (BP) was elevated above 135/85 this visit, please have this repeated by your doctor within one month. --------------

## 2017-05-22 NOTE — ED Notes (Signed)
Patient transported to CT 

## 2017-05-22 NOTE — ED Triage Notes (Signed)
Pt presents via [facility] c/o chest pain and HA. Chest pain started last night to central chest, described as tightness, non radiating. Denies SOB and N/V. Pt given 324mg  ASA and 1 NTG PTA. Pt also c/o R side HA since last night with associated light sensitivity.

## 2017-05-22 NOTE — ED Notes (Signed)
ED Provider at bedside. 

## 2017-05-22 NOTE — ED Provider Notes (Signed)
[facility] DEPT Provider Note   CSN: [id number] Arrival date & time: 05/22/17  6073     History   Chief Complaint Chief Complaint  Patient presents with  . Chest Pain  . Headache    HPI Edward [name] is a 76 y.o. male.  Patient with history of coronary stents and CABG in 1/18, diabetes -- presents with complaint of headache over the past 1 week, gradual onset with worsening. Patient notes that the headache is right-sided. He denies any vision changes, fevers, weakness in arms or legs. No neck pain. Denies recent head injuries. He notes that he has a waxing and waning essential tremor that is chronic. He has not had headaches like this in the past. He is compliant with Plavix. The onset of this condition was acute. The course is constant. Aggravating factors: none. Alleviating factors: none.   In addition patient describes some chest tightness last night and this morning. He states that this occurs when he is anxious. It does not remind him of his previous angina or heart related chest pain. Symptoms are now entirely resolved. No associated shortness of breath, vomiting. Symptoms are nonexertional.      Past Medical History:  Diagnosis Date  . Adenomatous colon polyp 04/2011  . CAD (coronary artery disease)    a. 01/2015 DES to LAD  b. 12/2015: [place] 85% oLCx lesion--> rx therapy.  . Chronic diastolic CHF (congestive heart failure) ([place])   . CKD (chronic kidney disease), stage II    a. probable CKD II-III with baseline CR 1.1-1.3.  . DM type 2 (diabetes mellitus, type 2), insulin dependent 01/29/2014   fasting cbg 50-120 with new regimen  . Dyslipidemia, goal LDL below 70 01/29/2014  . Enlarged prostate   . Essential tremor    a. on proprnolol  . GERD (gastroesophageal reflux disease)   . Hypertension   . Internal hemorrhoid   . Sleep apnea    does not use cpap (06/04/2015)  . TIA (transient ischemic attack) 2002    Patient Active Problem List   Diagnosis Date Noted  .  Shoulder pain 02/05/2017  . 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 Hypertensive heart disease without heart failure   . Chronic kidney disease, stage III (moderate) 11/03/2016  . Chronic diastolic HF (heart failure) ([place])   . Flank pain, acute 07/06/2016  . Dizziness   . Hypokalemia 04/19/2016  . Chest pain 04/19/2016  . Near syncope 04/19/2016  . Lightheaded 04/19/2016  . Diabetes mellitus ([place]) 03/11/2016  . Atherosclerosis of native coronary artery of native heart with unstable angina pectoris ([place])   . Wheezing 11/30/2015  . GERD (gastroesophageal reflux disease) 08/27/2015  . Acute bronchitis 08/27/2015  . Dyspnea 07/08/2015  . OSA (obstructive sleep apnea) 06/22/2015  . Essential tremor 04/23/2015  . Rotator cuff arthropathy 07/30/2014  . CAP (community acquired pneumonia) 04/16/2014  . Severe obesity (BMI >= 40) ([place]) 02/11/2014  . Hyperlipidemia LDL goal <70 01/29/2014  . Essential hypertension 01/29/2014    Past Surgical History:  Procedure Laterality Date  . BACK SURGERY    . CARDIAC CATHETERIZATION  01/28/14   + CAD treat medically  . CARDIAC CATHETERIZATION N/A 12/21/2015   Procedure: Right/Left Heart Cath and Coronary Angiography;  Surgeon: Belva Crome, MD;  Location: [facility];  Service: Cardiovascular;  Laterality: N/A;  . CARDIAC CATHETERIZATION N/A 11/02/2016   Procedure: Left Heart Cath and Coronary Angiography;  Surgeon: Nelva Bush, MD;  Location: [facility];  Service: Cardiovascular;  Laterality: N/A;  . CATARACT EXTRACTION Bilateral   . CATARACT EXTRACTION, BILATERAL Bilateral   . CORONARY ANGIOPLASTY WITH STENT PLACEMENT  01/30/2015   DES [place]  [place] to LAD by Dr Tamala Julian  . CORONARY ARTERY BYPASS GRAFT N/A 11/09/2016   Procedure: CORONARY ARTERY BYPASS GRAFTING (CABG) x 2;  Surgeon: Melrose Nakayama, MD;  Location: [facility];  Service: Open Heart Surgery;  Laterality: N/A;  . ENDOVEIN HARVEST OF GREATER SAPHENOUS VEIN Left 11/09/2016    Procedure: ENDOVEIN HARVEST OF GREATER SAPHENOUS VEIN;  Surgeon: Melrose Nakayama, MD;  Location: [facility];  Service: Open Heart Surgery;  Laterality: Left;  . JOINT REPLACEMENT    . LEFT HEART CATHETERIZATION WITH CORONARY ANGIOGRAM N/A 01/28/2014   Procedure: LEFT HEART CATHETERIZATION WITH CORONARY ANGIOGRAM;  Surgeon: Sinclair Grooms, MD;  Location: [facility] CATH LAB;  Service: Cardiovascular;  Laterality: N/A;  . LEFT HEART CATHETERIZATION WITH CORONARY ANGIOGRAM N/A 01/30/2015   Procedure: LEFT HEART CATHETERIZATION WITH CORONARY ANGIOGRAM;  Surgeon: Belva Crome, MD;  Location[facility];  Service: Cardiovascular;  Laterality: N/A;  . LUMBAR [place]    . SHOULDER ARTHROSCOPY Right 07/30/2014   Procedure: Right Shoulder Arthroscopy, Debridement, Decompression, Manipulation Under Anesthesia;  Surgeon: Newt Minion, MD;  Location: [facility];  Service: Orthopedics;  Laterality: Right;  . TEE WITHOUT CARDIOVERSION N/A 11/09/2016   Procedure: TRANSESOPHAGEAL ECHOCARDIOGRAM (TEE);  Surgeon: Melrose Nakayama, MD;  Location: [facility];  Service: Open Heart Surgery;  Laterality: N/A;  . TOTAL KNEE ARTHROPLASTY Bilateral        Home Medications    Prior to Admission medications   Medication Sig Start Date End Date Taking? Authorizing Provider  acetaminophen (TYLENOL) 500 MG tablet Take 2 tablets (1,000 mg total) by mouth every 6 (six) hours as needed. Patient taking differently: Take 1,000 mg by mouth every 6 (six) hours as needed for mild pain.  12/07/16  Yes Leo Grosser, MD  amLODipine (NORVASC) 5 MG tablet Take 1 tablet (5 mg total) by mouth daily. 05/05/17 08/03/17 Yes Belva Crome, MD  aspirin EC 81 MG tablet Take 81 mg by mouth daily.   Yes [provider]  clonazePAM (KLONOPIN) 0.5 MG tablet Take 1 tablet (0.5 mg total) by mouth 2 (two) times daily as needed (tremors). 09/28/16  Yes Patel, Donika K, DO  clopidogrel (PLAVIX) 75 MG tablet Take 1 tablet (75 mg total) by mouth daily.  12/23/16  Yes Belva Crome, MD  diclofenac sodium (VOLTAREN) 1 % GEL Apply 2 g topically 4 (four) times daily as needed (for pain).    Yes [provider]  furosemide (LASIX) 40 MG tablet take 1 tablet by mouth once daily 12/19/16  Yes Belva Crome, MD  gabapentin (NEURONTIN) 300 MG capsule Take 1 capsule (300 mg total) by mouth 2 (two) times daily. 02/06/17  Yes Vann, Jessica U, DO  insulin aspart (NOVOLOG) 100 UNIT/ML injection Inject 40 Units into the skin 3 (three) times daily before meals.   Yes [provider]  insulin NPH-regular Human (NOVOLIN 70/30) (70-30) 100 UNIT/ML injection Use up to 120 units a day under skin Patient taking differently: Inject 60 Units into the skin every evening.  05/16/17  Yes Philemon Kingdom, MD  Polyvinyl Alcohol-Povidone (REFRESH OP) Place 1 drop into both eyes daily as needed (for dry eyes). Reported on 12/25/2015   Yes [provider]  potassium chloride SA (K-DUR,KLOR-CON) 20 MEQ tablet take 1 tablet  by mouth once daily 01/10/17  Yes Belva Crome, MD  propranolol (INDERAL) 40 MG tablet Take 40 mg by mouth 3 (three) times daily. 10/25/16  Yes [provider]  psyllium (METAMUCIL) 58.6 % packet Take 1 packet by mouth daily as needed (constipation).   Yes [provider]  VENTOLIN HFA 108 (90 Base) MCG/ACT inhaler inhale 2 puffs by mouth every 6 hours if needed for wheezing or shortness of breath 05/04/17  Yes Lowne Chase, Yvonne R, DO  losartan (COZAAR) 25 MG tablet Take 1 tablet (25 mg total) by mouth daily. Patient not taking: Reported on 05/22/2017 02/23/17 05/24/17  Eileen [facility], PA-C  ranitidine (ZANTAC) 150 MG tablet Take 1 tablet (150 mg total) by mouth at bedtime. Patient not taking: Reported on 04/10/2017 03/02/17   Alfredia Ferguson, PA-C    Family History Family History  Problem Relation Age of Onset  . CAD Brother        2 brothers - CABG  . Alzheimer's disease Sister   . CAD Sister   . Prostate  cancer Brother   . Asthma Brother        2 brothers   . Diabetes Other        entire family  . Colon cancer Neg Hx     Social History Social History  Substance Use Topics  . Smoking status: Never Smoker  . Smokeless tobacco: Never Used  . Alcohol use No     Allergies   Pneumococcal vaccines   Review of Systems Review of Systems  Constitutional: Negative for diaphoresis and fever.  HENT: Negative for congestion, dental problem, rhinorrhea and sinus pressure.   Eyes: Negative for photophobia, discharge, redness and visual disturbance.  Respiratory: Positive for chest tightness. Negative for cough and shortness of breath.   Cardiovascular: Positive for chest pain. Negative for palpitations and leg swelling.  Gastrointestinal: Negative for abdominal pain, nausea and vomiting.  Genitourinary: Negative for dysuria.  Musculoskeletal: Negative for back pain, gait problem, neck pain and neck stiffness.  Skin: Negative for rash.  Neurological: Positive for headaches. Negative for syncope, speech difficulty, weakness, light-headedness and numbness.  Psychiatric/Behavioral: Negative for confusion. The patient is not nervous/anxious.      Physical Exam Updated Vital Signs BP (!) 123/112   Pulse 73   Temp 98.6 F (37 C) (Oral)   Resp 10   SpO2 98%   Physical Exam  Constitutional: He is oriented to person, place, and time. He appears well-developed and well-nourished.  HENT:  Head: Normocephalic and atraumatic.  Right Ear: Tympanic membrane, external ear and ear canal normal.  Left Ear: Tympanic membrane, external ear and ear canal normal.  Nose: Nose normal.  Mouth/Throat: Uvula is midline, oropharynx is clear and moist and mucous membranes are normal. Mucous membranes are not dry.  Eyes: Pupils are equal, round, and reactive to light. Conjunctivae, EOM and lids are normal.  Neck: Trachea normal and normal range of motion. Neck supple. Normal carotid pulses and no JVD  present. No muscular tenderness present. Carotid bruit is not present. No tracheal deviation present.  Cardiovascular: Normal rate, regular rhythm, S1 normal, S2 normal, normal heart sounds and intact distal pulses.  Exam reveals no distant heart sounds and no decreased pulses.   No murmur heard. Pulmonary/Chest: Effort normal and breath sounds normal. No respiratory distress. He has no wheezes. He exhibits no tenderness.  Abdominal: Soft. Normal aorta and bowel sounds are normal. There is no tenderness. There is no rebound  and no guarding.  Musculoskeletal: Normal range of motion. He exhibits no edema.       Cervical back: He exhibits normal range of motion, no tenderness and no bony tenderness.  Neurological: He is alert and oriented to person, place, and time. He has normal strength and normal reflexes. He displays tremor (mild essential, upper extremities). No cranial nerve deficit or sensory deficit. He exhibits normal muscle tone. He displays a negative Romberg sign. Coordination and gait normal. GCS eye subscore is 4. GCS verbal subscore is 5. GCS motor subscore is 6.  Skin: Skin is warm and dry. He is not diaphoretic. No cyanosis. No pallor.  Psychiatric: He has a normal mood and affect.  Nursing note and vitals reviewed.    ED Treatments / Results  Labs (all labs ordered are listed, but only abnormal results are displayed) Labs Reviewed  BASIC METABOLIC PANEL - Abnormal; Notable for the following:       Result Value   Chloride 100 (*)    Glucose, Bld 306 (*)    Calcium 8.8 (*)    GFR calc non Af Amer 60 (*)    All other components within normal limits  CBC - Abnormal; Notable for the following:    Hemoglobin 12.2 (*)    HCT 37.2 (*)    All other components within normal limits  I-STAT TROPONIN, ED    EKG  EKG Interpretation None      ED ECG REPORT   Date: 05/22/2017  Rate: 65  Rhythm: normal sinus rhythm  QRS Axis: normal  Intervals: normal  ST/T Wave  abnormalities: normal  Conduction Disutrbances:first-degree A-V block   Narrative Interpretation: q-waves noted  Old EKG Reviewed: unchanged  I have personally reviewed the EKG tracing and agree with the computerized printout as noted.    Radiology Dg Chest 2 View  Result Date: 05/22/2017 CLINICAL DATA:  Chest pain and headache. EXAM: CHEST  2 VIEW COMPARISON:  CT chest and two-view chest x-ray 02/05/2017 FINDINGS: The heart is enlarged. The lung volumes are low. Patient is status post median sternotomy for CABG. There is no edema or effusion. No focal airspace disease is present. The visualized soft tissues and bony thorax are unremarkable. IMPRESSION: 1. Cardiomegaly without failure. 2. No acute cardiopulmonary disease. Electronically Signed   By: San Morelle M.D.   On: 05/22/2017 09:29    Procedures Procedures (including critical care time)  Medications Ordered in ED Medications  fentaNYL (SUBLIMAZE) injection 50 mcg (not administered)  ondansetron (ZOFRAN) injection 4 mg (not administered)     Initial Impression / Assessment and Plan / ED Course  I have reviewed the triage vital signs and the nursing notes.  Pertinent labs & imaging results that were available during my care of the patient were reviewed by me and considered in my medical decision making (see chart for details).     Patient seen and examined. CT reviewed, EKG reviewed.   Vital signs reviewed and are as follows: BP (!) 123/112   Pulse 73   Temp 98.6 F (37 C) (Oral)   Resp 10   SpO2 98%   1:29 PM patient updated on results and is feeling better after treatment. Troponin negative 2. Repeat EKG unchanged. At this time, patient will be discharged home with PCP follow-up. Patient requested medication for headache. He is encouraged to use Tylenol, but will be given a prescription for Fioricet for more severe headache, unresponsive to Tylenol.  Use pain medication only under direct supervision  at the  lowest possible dose needed to control your pain.   Patient was counseled to return with severe chest pain, especially if the pain is crushing or pressure-like and spreads to the arms, back, neck, or jaw, or if they have sweating, nausea, or shortness of breath with the pain. They were encouraged to call 911 with these symptoms.   They were also told to return if their chest pain gets worse and does not go away with rest, they have an attack of chest pain lasting longer than usual despite rest and treatment with the medications their caregiver has prescribed, if they wake from sleep with chest pain or shortness of breath, if they feel dizzy or faint, if they have chest pain not typical of their usual pain, or if they have any other emergent concerns regarding their health.  The patient verbalized understanding and agreed.     Final Clinical Impressions(s) / ED Diagnoses   Final diagnoses:  Acute nonintractable headache, unspecified headache type  Precordial pain   HA: No new focal neurological deficits. CT imaging is negative for bleed or other acute intracranial process. Symptoms controlled in emergency department treatment.  Chest pain: Atypical for patient. States it is not like previous cardiac chest pain and states he thinks this was caused by anxiety. His troponins are negative 2. EKG without any sign of acute ischemia, unchanged during ED stay. Chest pain currently resolved. No clinical signs or symptoms of DVT/PE.  New Prescriptions New Prescriptions   BUTALBITAL-ACETAMINOPHEN-CAFFEINE (FIORICET, ESGIC) 50-325-40 MG TABLET    Take 1 tablet by mouth every 6 (six) hours as needed for headache.     Carlisle Cater, PA-C 05/22/17 1331    Pattricia Boss, MD 05/23/17 [phone]

## 2017-05-22 NOTE — ED Notes (Signed)
Pt now advising chest pain has resolved, only c/o headache.

## 2017-05-22 NOTE — ED Notes (Signed)
Patient transported to X-ray 

## 2017-05-26 ENCOUNTER — Encounter (HOSPITAL_COMMUNITY): Payer: Medicare Other

## 2017-05-29 ENCOUNTER — Encounter (HOSPITAL_COMMUNITY): Payer: Medicare Other

## 2017-05-30 ENCOUNTER — Telehealth (HOSPITAL_COMMUNITY): Payer: Self-pay | Admitting: *Deleted

## 2017-05-30 ENCOUNTER — Encounter (HOSPITAL_COMMUNITY): Payer: Self-pay | Admitting: *Deleted

## 2017-05-30 DIAGNOSIS — R350 Frequency of micturition: Secondary | ICD-10-CM | POA: Diagnosis not present

## 2017-05-30 DIAGNOSIS — N200 Calculus of kidney: Secondary | ICD-10-CM | POA: Diagnosis not present

## 2017-05-30 DIAGNOSIS — Z951 Presence of aortocoronary bypass graft: Secondary | ICD-10-CM

## 2017-05-30 DIAGNOSIS — N401 Enlarged prostate with lower urinary tract symptoms: Secondary | ICD-10-CM | POA: Diagnosis not present

## 2017-05-30 NOTE — Progress Notes (Signed)
Discharge Summary  Patient Details  Name: Edward [name] MRN: [id number] Date of Birth: [birth year] Referring Provider:     [facility] from 02/09/2017 in [place]  Referring Provider  Daneen Schick MD       Number of Visits: 7  Reason for Discharge:  Early Exit:  medcial issues and non ateendance  Smoking History:  History  Smoking Status  . Never Smoker  Smokeless Tobacco  . Never Used    Diagnosis:  11/09/16 S/P CABG x 2  ADL UCSD:   Initial Exercise Prescription:   Discharge Exercise Prescription (Final Exercise Prescription Changes):     Exercise Prescription Changes - 04/19/17 1600      Response to Exercise   Blood Pressure (Admit) 138/70   Blood Pressure (Exercise) 158/72   Blood Pressure (Exit) 138/70   Heart Rate (Admit) 81 bpm   Heart Rate (Exercise) 106 bpm   Heart Rate (Exit) 81 bpm   Rating of Perceived Exertion (Exercise) 14   Duration Progress to 45 minutes of aerobic exercise without signs/symptoms of physical distress   Intensity THRR unchanged     Progression   Progression Continue to progress workloads to maintain intensity without signs/symptoms of physical distress.   Average METs 2.7     Resistance Training   Training Prescription Yes   Weight 2   Reps 10-15     NuStep   Level 2   Minutes 20   METs 2.6     Track   Laps 10   Minutes 10   METs 2.7     Home Exercise Plan   Plans to continue exercise at Home (comment)   Frequency Add 3 additional days to program exercise sessions.      Functional Capacity:   Psychological, QOL, Others - Outcomes: PHQ 2/9: Depression screen [facility] 2/9 02/20/2017 12/08/2015 12/07/2015 06/04/2015  Decreased Interest 1 0 1 0  Down, Depressed, Hopeless 0 0 1 0  PHQ - 2 Score 1 0 2 0  Altered sleeping - - 0 -  Tired, decreased energy - - 3 -  Change in appetite - - 1 -  Feeling bad or failure about yourself  - - 0 -  Trouble concentrating - -  1 -  Moving slowly or fidgety/restless - - 0 -  Suicidal thoughts - - 0 -  PHQ-9 Score - - 7 -  Difficult doing work/chores - - Somewhat difficult -  Some recent data might be hidden    Quality of Life:   Personal Goals: Goals established at orientation with interventions provided to work toward goal.    Personal Goals Discharge:   Nutrition & Weight - Outcomes:    Nutrition:   Nutrition Discharge:   Education Questionnaire Score:   [name] attended 7 classes from 02/09/2017-04/10/2017. [name]'s attendance was poor. [name]'s attendance was poor. [name] enjoyed participating in phase 2 cardiac rehab although [name] did have some complaints of GI upset and diarrhea.[name], RN,BSN 05/30/2017 3:45 PM

## 2017-06-02 ENCOUNTER — Encounter (HOSPITAL_COMMUNITY): Payer: Medicare Other

## 2017-06-05 ENCOUNTER — Telehealth: Payer: Self-pay | Admitting: Interventional Cardiology

## 2017-06-05 ENCOUNTER — Encounter (HOSPITAL_COMMUNITY): Payer: Medicare Other

## 2017-06-05 NOTE — Telephone Encounter (Signed)
New message  Pt was call; confirming appt. Pt would like to know if he would be able to get lab work completed. Please call back to discuss

## 2017-06-05 NOTE — Telephone Encounter (Signed)
Spoke with patient who states he is concerned about kidney stones and about swelling over his body. He states he is also SOB. He is able to speak in clear complete sentences without breathlessness. He states the issue with his kidney is r/t kidney stones and he cannot have surgery at this time.  He has an appointment tomorrow 8/7 with Dr. Tamala Julian. I advised that Dr. Tamala Julian will assess him and may want to order lab work specific for his kidney function and fluid level. He verbalized understanding and agreement and thanked me for the call.

## 2017-06-06 ENCOUNTER — Ambulatory Visit (INDEPENDENT_AMBULATORY_CARE_PROVIDER_SITE_OTHER): Payer: Medicare Other | Admitting: Interventional Cardiology

## 2017-06-06 ENCOUNTER — Encounter: Payer: Self-pay | Admitting: Interventional Cardiology

## 2017-06-06 ENCOUNTER — Inpatient Hospital Stay (HOSPITAL_COMMUNITY)
Admission: AD | Admit: 2017-06-06 | Discharge: 2017-06-10 | DRG: 291 | Disposition: A | Discharge status: Home / Self Care | Payer: Medicare Other | Attending: Interventional Cardiology | Admitting: Interventional Cardiology

## 2017-06-06 ENCOUNTER — Encounter (HOSPITAL_COMMUNITY): Payer: Self-pay | Admitting: Cardiology

## 2017-06-06 VITALS — BP 146/60 | HR 81 | Ht 68.0 in | Wt 279.8 lb

## 2017-06-06 DIAGNOSIS — E1122 Type 2 diabetes mellitus with diabetic chronic kidney disease: Secondary | ICD-10-CM | POA: Diagnosis present

## 2017-06-06 DIAGNOSIS — Z794 Long term (current) use of insulin: Secondary | ICD-10-CM | POA: Diagnosis not present

## 2017-06-06 DIAGNOSIS — M7989 Other specified soft tissue disorders: Secondary | ICD-10-CM | POA: Diagnosis present

## 2017-06-06 DIAGNOSIS — N183 Chronic kidney disease, stage 3 unspecified: Secondary | ICD-10-CM | POA: Diagnosis present

## 2017-06-06 DIAGNOSIS — G250 Essential tremor: Secondary | ICD-10-CM | POA: Diagnosis present

## 2017-06-06 DIAGNOSIS — Z86010 Personal history of colonic polyps: Secondary | ICD-10-CM

## 2017-06-06 DIAGNOSIS — R0601 Orthopnea: Secondary | ICD-10-CM | POA: Diagnosis present

## 2017-06-06 DIAGNOSIS — Z96653 Presence of artificial knee joint, bilateral: Secondary | ICD-10-CM | POA: Diagnosis present

## 2017-06-06 DIAGNOSIS — E785 Hyperlipidemia, unspecified: Secondary | ICD-10-CM | POA: Diagnosis present

## 2017-06-06 DIAGNOSIS — K219 Gastro-esophageal reflux disease without esophagitis: Secondary | ICD-10-CM | POA: Diagnosis present

## 2017-06-06 DIAGNOSIS — I361 Nonrheumatic tricuspid (valve) insufficiency: Secondary | ICD-10-CM | POA: Diagnosis not present

## 2017-06-06 DIAGNOSIS — R0602 Shortness of breath: Secondary | ICD-10-CM | POA: Diagnosis not present

## 2017-06-06 DIAGNOSIS — R079 Chest pain, unspecified: Secondary | ICD-10-CM | POA: Diagnosis not present

## 2017-06-06 DIAGNOSIS — Z6841 Body Mass Index (BMI) 40.0 and over, adult: Secondary | ICD-10-CM

## 2017-06-06 DIAGNOSIS — Z951 Presence of aortocoronary bypass graft: Secondary | ICD-10-CM

## 2017-06-06 DIAGNOSIS — I5033 Acute on chronic diastolic (congestive) heart failure: Secondary | ICD-10-CM | POA: Diagnosis not present

## 2017-06-06 DIAGNOSIS — R42 Dizziness and giddiness: Secondary | ICD-10-CM | POA: Diagnosis not present

## 2017-06-06 DIAGNOSIS — I130 Hypertensive heart and chronic kidney disease with heart failure and stage 1 through stage 4 chronic kidney disease, or unspecified chronic kidney disease: Principal | ICD-10-CM | POA: Diagnosis present

## 2017-06-06 DIAGNOSIS — R0681 Apnea, not elsewhere classified: Secondary | ICD-10-CM | POA: Diagnosis present

## 2017-06-06 DIAGNOSIS — Z887 Allergy status to serum and vaccine status: Secondary | ICD-10-CM

## 2017-06-06 DIAGNOSIS — I25810 Atherosclerosis of coronary artery bypass graft(s) without angina pectoris: Secondary | ICD-10-CM | POA: Diagnosis not present

## 2017-06-06 DIAGNOSIS — E876 Hypokalemia: Secondary | ICD-10-CM

## 2017-06-06 DIAGNOSIS — R001 Bradycardia, unspecified: Secondary | ICD-10-CM | POA: Diagnosis not present

## 2017-06-06 DIAGNOSIS — I25709 Atherosclerosis of coronary artery bypass graft(s), unspecified, with unspecified angina pectoris: Secondary | ICD-10-CM

## 2017-06-06 DIAGNOSIS — Z7982 Long term (current) use of aspirin: Secondary | ICD-10-CM | POA: Diagnosis not present

## 2017-06-06 DIAGNOSIS — Z8249 Family history of ischemic heart disease and other diseases of the circulatory system: Secondary | ICD-10-CM

## 2017-06-06 DIAGNOSIS — Z7902 Long term (current) use of antithrombotics/antiplatelets: Secondary | ICD-10-CM | POA: Diagnosis not present

## 2017-06-06 DIAGNOSIS — I5032 Chronic diastolic (congestive) heart failure: Secondary | ICD-10-CM

## 2017-06-06 DIAGNOSIS — I25110 Atherosclerotic heart disease of native coronary artery with unstable angina pectoris: Secondary | ICD-10-CM | POA: Diagnosis present

## 2017-06-06 DIAGNOSIS — Z79899 Other long term (current) drug therapy: Secondary | ICD-10-CM | POA: Diagnosis not present

## 2017-06-06 DIAGNOSIS — I272 Pulmonary hypertension, unspecified: Secondary | ICD-10-CM | POA: Diagnosis present

## 2017-06-06 DIAGNOSIS — I10 Essential (primary) hypertension: Secondary | ICD-10-CM | POA: Diagnosis present

## 2017-06-06 DIAGNOSIS — E1159 Type 2 diabetes mellitus with other circulatory complications: Secondary | ICD-10-CM

## 2017-06-06 DIAGNOSIS — E119 Type 2 diabetes mellitus without complications: Secondary | ICD-10-CM

## 2017-06-06 DIAGNOSIS — I509 Heart failure, unspecified: Secondary | ICD-10-CM

## 2017-06-06 LAB — CBC WITH DIFFERENTIAL/PLATELET
Basophils Absolute: 0 10*3/uL (ref 0.0–0.1)
Basophils Relative: 0 %
EOS ABS: 0.2 10*3/uL (ref 0.0–0.7)
Eosinophils Relative: 3 %
HCT: 38.8 % — ABNORMAL LOW (ref 39.0–52.0)
HEMOGLOBIN: 12.6 g/dL — AB (ref 13.0–17.0)
Lymphocytes Relative: 25 %
Lymphs Abs: 1.7 10*3/uL (ref 0.7–4.0)
MCH: 27.8 pg (ref 26.0–34.0)
MCHC: 32.5 g/dL (ref 30.0–36.0)
MCV: 85.7 fL (ref 78.0–100.0)
Monocytes Absolute: 0.6 10*3/uL (ref 0.1–1.0)
Monocytes Relative: 9 %
NEUTROS PCT: 63 %
Neutro Abs: 4.3 10*3/uL (ref 1.7–7.7)
Platelets: 271 10*3/uL (ref 150–400)
RBC: 4.53 MIL/uL (ref 4.22–5.81)
RDW: 13.9 % (ref 11.5–15.5)
WBC: 6.9 10*3/uL (ref 4.0–10.5)

## 2017-06-06 LAB — MAGNESIUM: MAGNESIUM: 1.9 mg/dL (ref 1.7–2.4)

## 2017-06-06 LAB — COMPREHENSIVE METABOLIC PANEL
ALBUMIN: 3.4 g/dL — AB (ref 3.5–5.0)
ALK PHOS: 97 U/L (ref 38–126)
ALT: 17 U/L (ref 17–63)
AST: 23 U/L (ref 15–41)
Anion gap: 6 (ref 5–15)
BUN: 11 mg/dL (ref 6–20)
CALCIUM: 9.3 mg/dL (ref 8.9–10.3)
CO2: 32 mmol/L (ref 22–32)
CREATININE: 1.18 mg/dL (ref 0.61–1.24)
Chloride: 101 mmol/L (ref 101–111)
GFR calc Af Amer: >60 mL/min (ref >60)
GFR calc non Af Amer: 59 mL/min — ABNORMAL LOW (ref >60)
GLUCOSE: 359 mg/dL — AB (ref 65–99)
Potassium: 4.3 mmol/L (ref 3.5–5.1)
SODIUM: 139 mmol/L (ref 135–145)
Total Bilirubin: 0.5 mg/dL (ref 0.3–1.2)
Total Protein: 6.4 g/dL — ABNORMAL LOW (ref 6.5–8.1)

## 2017-06-06 LAB — GLUCOSE, CAPILLARY: GLUCOSE-CAPILLARY: 392 mg/dL — AB (ref 65–99)

## 2017-06-06 LAB — BRAIN NATRIURETIC PEPTIDE: B Natriuretic Peptide: 53.5 pg/mL (ref 0.0–100.0)

## 2017-06-06 LAB — PROTIME-INR
INR: 0.96
Prothrombin Time: 12.7 seconds (ref 11.4–15.2)

## 2017-06-06 LAB — TROPONIN I: Troponin I: <0.03 ng/mL (ref <0.03)

## 2017-06-06 LAB — TSH: TSH: 1.362 u[IU]/mL (ref 0.350–4.500)

## 2017-06-06 MED ORDER — ASPIRIN EC 81 MG PO TBEC
81.0000 mg | DELAYED_RELEASE_TABLET | Freq: Every day | ORAL | Status: DC
  Administered 2017-06-06 – 2017-06-10 (×5): 81 mg via ORAL
  Filled 2017-06-06 (×5): qty 1

## 2017-06-06 MED ORDER — FUROSEMIDE 10 MG/ML IJ SOLN
80.0000 mg | Freq: Two times a day (BID) | INTRAMUSCULAR | Status: DC
  Administered 2017-06-06 – 2017-06-08 (×4): 80 mg via INTRAVENOUS
  Filled 2017-06-06 (×4): qty 8

## 2017-06-06 MED ORDER — CLOPIDOGREL BISULFATE 75 MG PO TABS
75.0000 mg | ORAL_TABLET | Freq: Every day | ORAL | Status: DC
  Administered 2017-06-06 – 2017-06-10 (×5): 75 mg via ORAL
  Filled 2017-06-06 (×5): qty 1

## 2017-06-06 MED ORDER — SODIUM CHLORIDE 0.9% FLUSH
3.0000 mL | Freq: Two times a day (BID) | INTRAVENOUS | Status: DC
  Administered 2017-06-06 – 2017-06-09 (×7): 3 mL via INTRAVENOUS

## 2017-06-06 MED ORDER — ALBUTEROL SULFATE (2.5 MG/3ML) 0.083% IN NEBU: 2.5000 mg | INHALATION_SOLUTION | Freq: Four times a day (QID) | RESPIRATORY_TRACT | Status: DC | PRN

## 2017-06-06 MED ORDER — CLONAZEPAM 0.5 MG PO TABS: 0.5000 mg | ORAL_TABLET | Freq: Two times a day (BID) | ORAL | Status: DC | PRN

## 2017-06-06 MED ORDER — TAMSULOSIN HCL 0.4 MG PO CAPS
0.4000 mg | ORAL_CAPSULE | Freq: Every day | ORAL | Status: DC
  Administered 2017-06-06 – 2017-06-09 (×4): 0.4 mg via ORAL
  Filled 2017-06-06 (×4): qty 1

## 2017-06-06 MED ORDER — INSULIN ASPART 100 UNIT/ML ~~LOC~~ SOLN
40.0000 [IU] | Freq: Three times a day (TID) | SUBCUTANEOUS | Status: DC
  Administered 2017-06-07 – 2017-06-10 (×11): 40 [IU] via SUBCUTANEOUS

## 2017-06-06 MED ORDER — ACETAMINOPHEN 325 MG PO TABS
650.0000 mg | ORAL_TABLET | ORAL | Status: DC | PRN
  Administered 2017-06-06: 650 mg via ORAL
  Filled 2017-06-06: qty 2

## 2017-06-06 MED ORDER — AMLODIPINE BESYLATE 5 MG PO TABS
5.0000 mg | ORAL_TABLET | Freq: Every day | ORAL | Status: DC
  Administered 2017-06-06 – 2017-06-10 (×5): 5 mg via ORAL
  Filled 2017-06-06 (×5): qty 1

## 2017-06-06 MED ORDER — POTASSIUM CHLORIDE CRYS ER 20 MEQ PO TBCR
20.0000 meq | EXTENDED_RELEASE_TABLET | Freq: Two times a day (BID) | ORAL | Status: DC
  Administered 2017-06-06 – 2017-06-08 (×4): 20 meq via ORAL
  Filled 2017-06-06 (×4): qty 1

## 2017-06-06 MED ORDER — CARBOXYMETHYLCELLULOSE SODIUM 1 % OP SOLN: 1.0000 [drp] | Freq: Two times a day (BID) | OPHTHALMIC | Status: DC

## 2017-06-06 MED ORDER — PROPRANOLOL HCL 40 MG PO TABS
40.0000 mg | ORAL_TABLET | Freq: Three times a day (TID) | ORAL | Status: DC
  Administered 2017-06-06 – 2017-06-10 (×11): 40 mg via ORAL
  Filled 2017-06-06 (×13): qty 1

## 2017-06-06 MED ORDER — SODIUM CHLORIDE 0.9 % IV SOLN: 250.0000 mL | INTRAVENOUS | Status: DC | PRN

## 2017-06-06 MED ORDER — ENOXAPARIN SODIUM 40 MG/0.4ML ~~LOC~~ SOLN
40.0000 mg | SUBCUTANEOUS | Status: DC
  Administered 2017-06-06 – 2017-06-09 (×4): 40 mg via SUBCUTANEOUS
  Filled 2017-06-06 (×4): qty 0.4

## 2017-06-06 MED ORDER — SODIUM CHLORIDE 0.9% FLUSH: 3.0000 mL | INTRAVENOUS | Status: DC | PRN

## 2017-06-06 MED ORDER — GABAPENTIN 300 MG PO CAPS
300.0000 mg | ORAL_CAPSULE | Freq: Two times a day (BID) | ORAL | Status: DC
  Administered 2017-06-06 – 2017-06-10 (×8): 300 mg via ORAL
  Filled 2017-06-06 (×8): qty 1

## 2017-06-06 MED ORDER — PSYLLIUM 95 % PO PACK
1.0000 | PACK | Freq: Every day | ORAL | Status: DC
  Administered 2017-06-07 – 2017-06-10 (×4): 1 via ORAL
  Filled 2017-06-06 (×4): qty 1

## 2017-06-06 MED ORDER — INSULIN ASPART PROT & ASPART (70-30 MIX) 100 UNIT/ML ~~LOC~~ SUSP
60.0000 [IU] | Freq: Every day | SUBCUTANEOUS | Status: DC
  Administered 2017-06-06 – 2017-06-07 (×2): 60 [IU] via SUBCUTANEOUS
  Filled 2017-06-06: qty 10

## 2017-06-06 MED ORDER — BUTALBITAL-APAP-CAFFEINE 50-325-40 MG PO TABS: 1.0000 | ORAL_TABLET | Freq: Four times a day (QID) | ORAL | Status: DC | PRN

## 2017-06-06 MED ORDER — POLYVINYL ALCOHOL 1.4 % OP SOLN
1.0000 [drp] | Freq: Two times a day (BID) | OPHTHALMIC | Status: DC
  Administered 2017-06-06 – 2017-06-09 (×5): 1 [drp] via OPHTHALMIC
  Filled 2017-06-06: qty 15

## 2017-06-06 MED ORDER — ONDANSETRON HCL 4 MG/2ML IJ SOLN: 4.0000 mg | Freq: Four times a day (QID) | INTRAMUSCULAR | Status: DC | PRN

## 2017-06-06 NOTE — Progress Notes (Signed)
Cardiology Office Note    Date:  06/06/2017   ID:  Edward [name], DOB Aug 02, 1941, MRN [id number]  PCP:  Ann Held, DO  Cardiologist: Sinclair Grooms, MD   Chief Complaint  Patient presents with  . Congestive Heart Failure    History of Present Illness:  Edward [name] is a 76 y.o. male who presents for follow-up of CAD with April 2016 drug-eluting stent and February 2017 unstable angina due to high-grade circumflex being treated with medical therapy. CABG x2 for ostial circumflex disease. Also, chronic diastolic heart failure, hypertension, suspected sleep apnea, and obesity  He complains of a 4-6 week history of progressive bilateral lower extremity swelling, left greater than right, with orthopnea, cough, and wheezing. He is having difficulty sleeping because of dyspnea. He is independently taking higher doses of furosemide to help with the lower extremity swelling. He denies nonsteroidal use. He has been no change in his diet, specifically denying excessive salt intake. He has not had chest pain. He states compliance with his medical regimen as listed.   Past Medical History:  Diagnosis Date  . Adenomatous colon polyp 04/2011  . CAD (coronary artery disease)    a. 01/2015 DES to LAD  b. 12/2015: [place] 85% oLCx lesion--> rx therapy.  . Chronic diastolic CHF (congestive heart failure) ([place])   . CKD (chronic kidney disease), stage II    a. probable CKD II-III with baseline CR 1.1-1.3.  . DM type 2 (diabetes mellitus, type 2), insulin dependent 01/29/2014   fasting cbg 50-120 with new regimen  . Dyslipidemia, goal LDL below 70 01/29/2014  . Enlarged prostate   . Essential tremor    a. on proprnolol  . GERD (gastroesophageal reflux disease)   . Hypertension   . Internal hemorrhoid   . Sleep apnea    does not use cpap (06/04/2015)  . TIA (transient ischemic attack) 2002    Past Surgical History:  Procedure Laterality Date  . BACK SURGERY    . CARDIAC CATHETERIZATION   01/28/14   + CAD treat medically  . CARDIAC CATHETERIZATION N/A 12/21/2015   Procedure: Right/Left Heart Cath and Coronary Angiography;  Surgeon: Belva Crome, MD;  Location: [facility];  Service: Cardiovascular;  Laterality: N/A;  . CARDIAC CATHETERIZATION N/A 11/02/2016   Procedure: Left Heart Cath and Coronary Angiography;  Surgeon: Nelva Bush, MD;  Location: [facility];  Service: Cardiovascular;  Laterality: N/A;  . CATARACT EXTRACTION Bilateral   . CATARACT EXTRACTION, BILATERAL Bilateral   . CORONARY ANGIOPLASTY WITH STENT PLACEMENT  01/30/2015   DES [place]  [place] to LAD by Dr Tamala Julian  . CORONARY ARTERY BYPASS GRAFT N/A 11/09/2016   Procedure: CORONARY ARTERY BYPASS GRAFTING (CABG) x 2;  Surgeon: Melrose Nakayama, MD;  Location: [facility];  Service: Open Heart Surgery;  Laterality: N/A;  . ENDOVEIN HARVEST OF GREATER SAPHENOUS VEIN Left 11/09/2016   Procedure: ENDOVEIN HARVEST OF GREATER SAPHENOUS VEIN;  Surgeon: Melrose Nakayama, MD;  Location: [facility];  Service: Open Heart Surgery;  Laterality: Left;  . JOINT REPLACEMENT    . LEFT HEART CATHETERIZATION WITH CORONARY ANGIOGRAM N/A 01/28/2014   Procedure: LEFT HEART CATHETERIZATION WITH CORONARY ANGIOGRAM;  Surgeon: Sinclair Grooms, MD;  Location: [facility] CATH [facility];  Service: Cardiovascular;  Laterality: N/A;  . LEFT HEART CATHETERIZATION WITH CORONARY ANGIOGRAM N/A 01/30/2015   Procedure: LEFT HEART CATHETERIZATION WITH CORONARY ANGIOGRAM;  Surgeon: Belva Crome, MD;  Location: [facility]  CATH LAB;  Service: Cardiovascular;  Laterality: N/A;  . LUMBAR [place]    . SHOULDER ARTHROSCOPY Right 07/30/2014   Procedure: Right Shoulder Arthroscopy, Debridement, Decompression, Manipulation Under Anesthesia;  Surgeon: Newt Minion, MD;  Location: [facility];  Service: Orthopedics;  Laterality: Right;  . TEE WITHOUT CARDIOVERSION N/A 11/09/2016   Procedure: TRANSESOPHAGEAL ECHOCARDIOGRAM (TEE);  Surgeon: Melrose Nakayama, MD;  Location: [facility];  Service: Open Heart Surgery;  Laterality: N/A;  . TOTAL KNEE ARTHROPLASTY Bilateral     Current Medications: Outpatient Medications Prior to Visit  Medication Sig Dispense Refill  . acetaminophen (TYLENOL) 500 MG tablet Take 2 tablets (1,000 mg total) by mouth every 6 (six) hours as needed. (Patient taking differently: Take 1,000 mg by mouth every 6 (six) hours as needed for mild pain. ) 30 tablet 0  . amLODipine (NORVASC) 5 MG tablet Take 1 tablet (5 mg total) by mouth daily. 90 tablet 3  . aspirin EC 81 MG tablet Take 81 mg by mouth daily.    . butalbital-acetaminophen-caffeine (FIORICET, ESGIC) 50-325-40 MG tablet Take 1 tablet by mouth every 6 (six) hours as needed for headache. 20 tablet 0  . clonazePAM (KLONOPIN) 0.5 MG tablet Take 1 tablet (0.5 mg total) by mouth 2 (two) times daily as needed (tremors). 45 tablet 3  . clopidogrel (PLAVIX) 75 MG tablet Take 1 tablet (75 mg total) by mouth daily. 90 tablet 3  . diclofenac sodium (VOLTAREN) 1 % GEL Apply 2 g topically 4 (four) times daily as needed (for pain).     . furosemide (LASIX) 40 MG tablet take 1 tablet by mouth once daily 90 tablet 1  . gabapentin (NEURONTIN) 300 MG capsule Take 1 capsule (300 mg total) by mouth 2 (two) times daily.    . insulin aspart (NOVOLOG) 100 UNIT/ML injection Inject 40 Units into the skin 3 (three) times daily before meals.    . insulin NPH-regular Human (NOVOLIN 70/30) (70-30) 100 UNIT/ML injection Use up to 120 units a day under skin (Patient taking differently: Inject 60 Units into the skin every evening. ) 40 mL 11  . Polyvinyl Alcohol-Povidone (REFRESH OP) Place 1 drop into both eyes daily as needed (for dry eyes). Reported on 12/25/2015    . potassium chloride SA (K-DUR,KLOR-CON) 20 MEQ tablet take 1 tablet by mouth once daily 30 tablet 11  . propranolol (INDERAL) 40 MG tablet Take 40 mg by mouth 3 (three) times daily.  0  . psyllium (METAMUCIL) 58.6 % packet Take 1 packet by mouth daily as needed  (constipation).    . VENTOLIN HFA 108 (90 Base) MCG/ACT inhaler inhale 2 puffs by mouth every 6 hours if needed for wheezing or shortness of breath 18 g 1   Facility-Administered Medications Prior to Visit  Medication Dose Route Frequency Provider Last Rate Last Dose  . 0.9 %  sodium chloride infusion  500 mL Intravenous Continuous Ladene Artist, MD         Allergies:   Pneumococcal vaccines   Social History   Social History  . Marital status: Married    Spouse name: N/A  . Number of children: 0  . Years of education: N/A   Occupational History  . retired    Social History Main Topics  . Smoking status: Never Smoker  . Smokeless tobacco: Never Used  . Alcohol use No  . Drug use: No  . Sexual activity: No   Other Topics Concern  . None  Social History Narrative   He is a Geophysicist/field seismologist by trade.   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   He lives alone.  His wife is living in [place] at this time.  They do not have children.   Highest level of education:  College graduate     Family History:  The patient's family history includes Alzheimer's disease in his sister; Asthma in his brother; CAD in his brother and sister; Diabetes in his other; Prostate cancer in his brother.  ROS:   Please see the history of present illness.      Unexplained weight gain, leg swelling left greater than right, dyspnea on exertion, orthopnea, cough, vision disturbance, muscle pains and aches. Anxiety. Wheezing. Cough without phlegm production, leg pain related to swelling, fatigue, and sweating.  All other systems reviewed and are negative.   PHYSICAL EXAM:   VS:  BP (!) 146/60 (BP Location: Left Arm)   Pulse 81   Ht 5\' 8"  (1.727 m)   Wt 279 lb 12.8 oz (126.9 kg)   BMI 42.54 kg/m    GEN: Well nourished, well developed, in no acute distress  HEENT: normal  Neck: Difficult to assess but suspicion of elevation to angle of the jaw. No carotid bruits, or  masses Cardiac: RRR; no murmurs, rubs, or gallops. Severe bilateral pitting edema left leg greater than right from the ankles to the knee. Respiratory:  Decreased breath sounds at the right base. GI: soft, nontender, nondistended, + BS MS: no deformity or atrophy  Skin: warm and dry, no rash Neuro:  Alert and Oriented x 3, Strength and sensation are intact Psych: euthymic mood, full affect  Wt Readings from Last 3 Encounters:  06/06/17 279 lb 12.8 oz (126.9 kg)  05/16/17 281 lb (127.5 kg)  04/25/17 271 lb (122.9 kg)      Studies/Labs Reviewed:   EKG:  EKG  Sinus rhythm, first-degree AV block, QS pattern V1 through V3 on heart tracing performed 05/22/2017.  Recent Labs: 10/29/2016: B Natriuretic Peptide 21.4; TSH 2.344 11/10/2016: Magnesium 1.9 01/02/2017: ALT 21 05/22/2017: BUN 15; Creatinine, Ser 1.17; Hemoglobin 12.2; Platelets 228; Potassium 4.0; Sodium 136   Lipid Panel    Component Value Date/Time   CHOL 166 02/06/2017 0147   TRIG 146 02/06/2017 0147   HDL 30 (L) 02/06/2017 0147   CHOLHDL 5.5 02/06/2017 0147   VLDL 29 02/06/2017 0147   LDLCALC 107 (H) 02/06/2017 0147   LDLDIRECT 144.0 07/08/2015 1344    Additional studies/ records that were reviewed today include:  2-D Doppler echocardiogram January 2018: Study Conclusions  - Left ventricle: The cavity size was normal. Wall thickness was   increased in a pattern of moderate LVH. Systolic function was   normal. The estimated ejection fraction was in the range of 60%   to 65%. Wall motion was normal; there were no regional wall   motion abnormalities. Doppler parameters are consistent with   abnormal left ventricular relaxation (grade 1 diastolic   dysfunction). - Aortic valve: There was no stenosis. - Mitral valve: Mildly calcified annulus. There was trivial   regurgitation. - Left atrium: The atrium was mildly dilated. - Right ventricle: The cavity size was mildly dilated. Systolic   function was normal. -  Right atrium: The atrium was mildly dilated. - Tricuspid valve: Peak RV-RA gradient (S): 34 mm Hg. - Pulmonary arteries: PA peak pressure: 37 mm Hg (S). - Inferior vena cava: The vessel was normal in size. The  respirophasic diameter changes were in the normal range (>= 50%),   consistent with normal central venous pressure.  Impressions:  - Normal LV size with moderate LV hypertrophy. EF 60-65%. Mildly   dilated RV with normal systolic function. No significant valvular   abnormalities. Mild pulmonary hypertension.   ASSESSMENT:    1. Acute on chronic diastolic heart failure   2. Essential hypertension   3. S/P CABG x 2   4. Hyperlipidemia LDL goal <70   5. Type 2 diabetes mellitus with other circulatory complication, with long-term current use of insulin (HCC)      PLAN:  In order of problems listed above:  1. Clearly volume overloaded. Weight is up 15 pounds since mid April when he weighed 264. This is been a subacute development. Not sure what the precipitant is. Could simply be related to inadequate volume removal. Rule out anemia, dietary indiscretion, or other secondary causes. We should repeat his Echocardiogram to exclude new systolic dysfunction. EF prior to surgery in January was 60%. Plan aggressive IV diuresis starting with 80 mg of furosemide every 12 hours. Monitor urine output and weights closely. 2. Diuresis will help improve blood pressure control. 3. No evidence of ongoing ischemia based on symptoms. Will repeat EKG and compared to last one performed 2 weeks ago. We'll also check a troponin echo will be done to look for regional wall motion abnormality.  4. LDL target is less than 70. A statin is not listed on his mid profile. This needs to be started and the target achieved. Last LDL was 107 and April 2018. 5. The patient has, complicated diabetes mellitus with vascular complications. In addition to his typical medications we will write for sliding scale while  hospitalized.  Overall, the patient is in congestive heart failure. I believe this is acute on chronic diastolic heart failure. He needs aggressive diuresis. He is 1315 pounds above his weight and April 2015. He has significant debility related to dyspnea and has not been sleeping well due to orthopnea. Repeat echo will be done to exclude systolic dysfunction. Other laboratory data including hemoglobin, TSH, electrolytes will be performed to exclude other issues could be contributing.  Medication Adjustments/Labs and Tests Ordered: Current medicines are reviewed at length with the patient today.  Concerns regarding medicines are outlined above.  Medication changes, Labs and Tests ordered today are listed in the Patient Instructions below. Patient Instructions  Medication Instructions:  None  Labwork: None  Testing/Procedures: None  Follow-Up: Your physician recommends that you schedule a follow-up appointment on 06/22/17 at [facility] will call you when they have an opening on [address] to come in and be admitted.   Any Other Special Instructions Will Be Listed Below (If Applicable).     If you need a refill on your cardiac medications before your next appointment, please call your pharmacy.      Signed, Sinclair Grooms, MD  06/06/2017 1:03 PM    [facility] [address], [place], [place]  [address] Phone: [phone]; Fax: [phone]

## 2017-06-06 NOTE — Patient Instructions (Addendum)
Medication Instructions:  None  Labwork: None  Testing/Procedures: None  Follow-Up: Your physician recommends that you schedule a follow-up appointment on 06/22/17 at [facility] will call you when they have an opening on [address] to come in and be admitted.   Any Other Special Instructions Will Be Listed Below (If Applicable).     If you need a refill on your cardiac medications before your next appointment, please call your pharmacy.

## 2017-06-06 NOTE — H&P (Signed)
Cardiology Admission History and Physical:   Patient ID: Edward [name]; MRN: [id number]; DOB: 02-Dec-1940   Admission date: 06/06/2017  Primary Care Provider: Carollee Herter, Alferd Apa, DO Primary Cardiologist: Illene Labrador, III, M.D. Primary Electrophysiologist:  None  Chief Complaint:  Progressive swelling and dyspnea on exertion.  Patient Profile:   Edward [name] is a 76 y.o. male with a history of Morbid obesity, obstructive sleep apnea, type 2 diabetes with complications, coronary artery disease with two-vessel coronary bypass grafting in January 1275, chronic diastolic heart failure, stage III chronic kidney disease, was admitted to the hospital with a 6 week history of progressive weight gain, edema, and dyspnea.    History of Present Illness:  Edward [name] is a 76 y.o. male who presents for follow-up of CAD with April 2016 drug-eluting stent and February 2017 unstable angina due to high-grade circumflex being treated with medical therapy. CABG x2 for ostial circumflex disease. Also,chronic diastolic heart failure, hypertension, suspected sleep apnea, and obesity  He complains of a 4-6 week history of progressive bilateral lower extremity swelling, left greater than right, with orthopnea, cough, and wheezing. He is having difficulty sleeping because of dyspnea. He is independently taking higher doses of furosemide to help with the lower extremity swelling. He denies nonsteroidal use. He has been no change in his diet, specifically denying excessive salt intake. He has not had chest pain. He states compliance with his medical regimen as listed.       Past Medical History:  Diagnosis Date  . Adenomatous colon polyp 04/2011  . CAD (coronary artery disease)    a. 01/2015 DES to LAD  b. 12/2015: [place] 85% oLCx lesion--> rx therapy.  . Chronic diastolic CHF (congestive heart failure) ([place])   . CKD (chronic kidney disease), stage II    a. probable CKD II-III with  baseline CR 1.1-1.3.  . DM type 2 (diabetes mellitus, type 2), insulin dependent 01/29/2014   fasting cbg 50-120 with new regimen  . Dyslipidemia, goal LDL below 70 01/29/2014  . Enlarged prostate   . Essential tremor    a. on proprnolol  . GERD (gastroesophageal reflux disease)   . Hypertension   . Internal hemorrhoid   . Sleep apnea    does not use cpap (06/04/2015)  . TIA (transient ischemic attack) 2002         Past Surgical History:  Procedure Laterality Date  . BACK SURGERY    . CARDIAC CATHETERIZATION  01/28/14   + CAD treat medically  . CARDIAC CATHETERIZATION N/A 12/21/2015   Procedure: Right/Left Heart Cath and Coronary Angiography;  Surgeon: Belva Crome, MD;  Location: [facility];  Service: Cardiovascular;  Laterality: N/A;  . CARDIAC CATHETERIZATION N/A 11/02/2016   Procedure: Left Heart Cath and Coronary Angiography;  Surgeon: Nelva Bush, MD;  Location: [facility];  Service: Cardiovascular;  Laterality: N/A;  . CATARACT EXTRACTION Bilateral   . CATARACT EXTRACTION, BILATERAL Bilateral   . CORONARY ANGIOPLASTY WITH STENT PLACEMENT  01/30/2015   DES [place]  [place] to LAD by Dr Tamala Julian  . CORONARY ARTERY BYPASS GRAFT N/A 11/09/2016   Procedure: CORONARY ARTERY BYPASS GRAFTING (CABG) x 2;  Surgeon: Melrose Nakayama, MD;  Location: [facility];  Service: Open Heart Surgery;  Laterality: N/A;  . ENDOVEIN HARVEST OF GREATER SAPHENOUS VEIN Left 11/09/2016   Procedure: ENDOVEIN HARVEST OF GREATER SAPHENOUS VEIN;  Surgeon: Melrose Nakayama, MD;  Location: [facility];  Service: Open  Heart Surgery;  Laterality: Left;  . JOINT REPLACEMENT    . LEFT HEART CATHETERIZATION WITH CORONARY ANGIOGRAM N/A 01/28/2014   Procedure: LEFT HEART CATHETERIZATION WITH CORONARY ANGIOGRAM;  Surgeon: Sinclair Grooms, MD;  Location: [facility];  Service: Cardiovascular;  Laterality: N/A;  . LEFT HEART CATHETERIZATION WITH CORONARY ANGIOGRAM N/A 01/30/2015    Procedure: LEFT HEART CATHETERIZATION WITH CORONARY ANGIOGRAM;  Surgeon: Belva Crome, MD;  Location: [facility];  Service: Cardiovascular;  Laterality: N/A;  . LUMBAR [place]    . SHOULDER ARTHROSCOPY Right 07/30/2014   Procedure: Right Shoulder Arthroscopy, Debridement, Decompression, Manipulation Under Anesthesia;  Surgeon: Newt Minion, MD;  Location: [place];  Service: Orthopedics;  Laterality: Right;  . TEE WITHOUT CARDIOVERSION N/A 11/09/2016   Procedure: TRANSESOPHAGEAL ECHOCARDIOGRAM (TEE);  Surgeon: Melrose Nakayama, MD;  Location: [place];  Service: Open Heart Surgery;  Laterality: N/A;  . TOTAL KNEE ARTHROPLASTY Bilateral     Current Medications:       Outpatient Medications Prior to Visit  Medication Sig Dispense Refill  . acetaminophen (TYLENOL) 500 MG tablet Take 2 tablets (1,000 mg total) by mouth every 6 (six) hours as needed. (Patient taking differently: Take 1,000 mg by mouth every 6 (six) hours as needed for mild pain. ) 30 tablet 0  . amLODipine (NORVASC) 5 MG tablet Take 1 tablet (5 mg total) by mouth daily. 90 tablet 3  . aspirin EC 81 MG tablet Take 81 mg by mouth daily.    . butalbital-acetaminophen-caffeine (FIORICET, ESGIC) 50-325-40 MG tablet Take 1 tablet by mouth every 6 (six) hours as needed for headache. 20 tablet 0  . clonazePAM (KLONOPIN) 0.5 MG tablet Take 1 tablet (0.5 mg total) by mouth 2 (two) times daily as needed (tremors). 45 tablet 3  . clopidogrel (PLAVIX) 75 MG tablet Take 1 tablet (75 mg total) by mouth daily. 90 tablet 3  . diclofenac sodium (VOLTAREN) 1 % GEL Apply 2 g topically 4 (four) times daily as needed (for pain).     . furosemide (LASIX) 40 MG tablet take 1 tablet by mouth once daily 90 tablet 1  . gabapentin (NEURONTIN) 300 MG capsule Take 1 capsule (300 mg total) by mouth 2 (two) times daily.    . insulin aspart (NOVOLOG) 100 UNIT/ML injection Inject 40 Units into the skin 3 (three) times daily before meals.    .  insulin NPH-regular Human (NOVOLIN 70/30) (70-30) 100 UNIT/ML injection Use up to 120 units a day under skin (Patient taking differently: Inject 60 Units into the skin every evening. ) 40 mL 11  . Polyvinyl Alcohol-Povidone (REFRESH OP) Place 1 drop into both eyes daily as needed (for dry eyes). Reported on 12/25/2015    . potassium chloride SA (K-DUR,KLOR-CON) 20 MEQ tablet take 1 tablet by mouth once daily 30 tablet 11  . propranolol (INDERAL) 40 MG tablet Take 40 mg by mouth 3 (three) times daily.  0  . psyllium (METAMUCIL) 58.6 % packet Take 1 packet by mouth daily as needed (constipation).    . VENTOLIN HFA 108 (90 Base) MCG/ACT inhaler inhale 2 puffs by mouth every 6 hours if needed for wheezing or shortness of breath 18 g 1            Facility-Administered Medications Prior to Visit  Medication Dose Route Frequency Provider Last Rate Last Dose  . 0.9 %  sodium chloride infusion  500 mL Intravenous Continuous Ladene Artist, MD  Allergies:   Pneumococcal vaccines   Social History        Social History  . Marital status: Married    Spouse name: N/A  . Number of children: 0  . Years of education: N/A       Occupational History  . retired        Social History Main Topics  . Smoking status: Never Smoker  . Smokeless tobacco: Never Used  . Alcohol use No  . Drug use: No  . Sexual activity: No       Other Topics Concern  . None      Social History Narrative   He is a Geophysicist/field seismologist by trade.   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   He lives alone.  His wife is living in [place] at this time.  They do not have children.   Highest level of education:  College graduate     Family History:  The patient's family history includes Alzheimer's disease in his sister; Asthma in his brother; CAD in his brother and sister; Diabetes in his other; Prostate cancer in his brother.  ROS:   Please see the history of present  illness.      Unexplained weight gain, leg swelling left greater than right, dyspnea on exertion, orthopnea, cough, vision disturbance, muscle pains and aches. Anxiety. Wheezing. Cough without phlegm production, leg pain related to swelling, fatigue, and sweating.  All other systems reviewed and are negative.   PHYSICAL EXAM:   VS:  BP (!) 146/60 (BP Location: Left Arm)   Pulse 81   Ht 5\' 8"  (1.727 m)   Wt 279 lb 12.8 oz (126.9 kg)   BMI 42.54 kg/m    GEN: Well nourished, well developed, in no acute distress  HEENT: normal  Neck: Difficult to assess but suspicion of elevation to angle of the jaw. No carotid bruits, or masses Cardiac: RRR; no murmurs, rubs, or gallops. Severe bilateral pitting edema left leg greater than right from the ankles to the knee. Respiratory:  Decreased breath sounds at the right base. GI: soft, nontender, nondistended, + BS MS: no deformity or atrophy  Skin: warm and dry, no rash Neuro:  Alert and Oriented x 3, Strength and sensation are intact Psych: euthymic mood, full affect     Wt Readings from Last 3 Encounters:  06/06/17 279 lb 12.8 oz (126.9 kg)  05/16/17 281 lb (127.5 kg)  04/25/17 271 lb (122.9 kg)      Studies/Labs Reviewed:   EKG:  EKG  Sinus rhythm, first-degree AV block, QS pattern V1 through V3 on heart tracing performed 05/22/2017.  Recent Labs: 10/29/2016: B Natriuretic Peptide 21.4; TSH 2.344 11/10/2016: Magnesium 1.9 01/02/2017: ALT 21 05/22/2017: BUN 15; Creatinine, Ser 1.17; Hemoglobin 12.2; Platelets 228; Potassium 4.0; Sodium 136   Lipid Panel         Component Value Date/Time   CHOL 166 02/06/2017 0147   TRIG 146 02/06/2017 0147   HDL 30 (L) 02/06/2017 0147   CHOLHDL 5.5 02/06/2017 0147   VLDL 29 02/06/2017 0147   LDLCALC 107 (H) 02/06/2017 0147   LDLDIRECT 144.0 07/08/2015 1344    Additional studies/ records that were reviewed today include:  2-D Doppler echocardiogram January 2018: Study  Conclusions  - Left ventricle: The cavity size was normal. Wall thickness was increased in a pattern of moderate LVH. Systolic function was normal. The estimated ejection fraction was in the range of 60% to 65%. Wall motion  was normal; there were no regional wall motion abnormalities. Doppler parameters are consistent with abnormal left ventricular relaxation (grade 1 diastolic dysfunction). - Aortic valve: There was no stenosis. - Mitral valve: Mildly calcified annulus. There was trivial regurgitation. - Left atrium: The atrium was mildly dilated. - Right ventricle: The cavity size was mildly dilated. Systolic function was normal. - Right atrium: The atrium was mildly dilated. - Tricuspid valve: Peak RV-RA gradient (S): 34 mm Hg. - Pulmonary arteries: PA peak pressure: 37 mm Hg (S). - Inferior vena cava: The vessel was normal in size. The respirophasic diameter changes were in the normal range (>= 50%), consistent with normal central venous pressure.  Impressions:  - Normal LV size with moderate LV hypertrophy. EF 60-65%. Mildly dilated RV with normal systolic function. No significant valvular abnormalities. Mild pulmonary hypertension.   ASSESSMENT:    1. Acute on chronic diastolic heart failure   2. Essential hypertension   3. S/P CABG x 2   4. Hyperlipidemia LDL goal <70   5. Type 2 diabetes mellitus with other circulatory complication, with long-term current use of insulin ([place])   6.      Essential tremor for which in the relative is being used. I have tried in the past to switch to metoprolol or carvedilol due to his           chronic heart disease. These agents do not work as well and therefore we have settled.   PLAN:  In order of problems listed above:  1. Clearly volume overloaded. Weight is up 15 pounds since mid April when he weighed 264. This is been a subacute development. Not sure what the precipitant is. Could simply be  related to inadequate volume removal. Rule out anemia, dietary indiscretion, or other secondary causes. We should repeat his Echocardiogram to exclude new systolic dysfunction. EF prior to surgery in January was 60%. Plan aggressive IV diuresis starting with 80 mg of furosemide every 12 hours. Monitor urine output and weights closely. 2. Diuresis will help improve blood pressure control. 3. No evidence of ongoing ischemia based on symptoms. Will repeat EKG and compared to last one performed 2 weeks ago. We'll also check a troponin echo will be done to look for regional wall motion abnormality.  4. LDL target is less than 70. A statin is not listed on his mid profile. This needs to be started and the target achieved. Last LDL was 107 and April 2018. 5. The patient has, complicated diabetes mellitus with vascular complications. In addition to his typical medications we will write for sliding scale while hospitalized. 6. Continue and around for essential tremor.  Overall, the patient is in congestive heart failure. I believe this is acute on chronic diastolic heart failure. He needs aggressive diuresis. He is 1315 pounds above his weight and April 2015. He has significant debility related to dyspnea and has not been sleeping well due to orthopnea. Repeat echo will be done to exclude systolic dysfunction. Other laboratory data including hemoglobin, TSH, electrolytes will be performed to exclude other issues could be contributing

## 2017-06-06 NOTE — H&P (Deleted)
Cardiology Admission History and Physical:   Patient ID: Edward [name]; MRN: [id number]; DOB: 26-Sep-1941   Admission date: 05/22/2017  Primary Care Provider: Carollee Herter, Alferd Apa, DO Primary Cardiologist: Illene Labrador, III, M.D. Primary Electrophysiologist:  None  Chief Complaint:  Progressive swelling and dyspnea on exertion.  Patient Profile:   Edward [name] is a 76 y.o. male with a history of Morbid obesity, obstructive sleep apnea, type 2 diabetes with complications, coronary artery disease with two-vessel coronary bypass grafting in January 4193, chronic diastolic heart failure, stage III chronic kidney disease, was admitted to the hospital with a 6 week history of progressive weight gain, edema, and dyspnea.    History of Present Illness:  Edward [name] is a 76 y.o. male who presents for follow-up of CAD with April 2016 drug-eluting stent and February 2017 unstable angina due to high-grade circumflex being treated with medical therapy. CABG x2 for ostial circumflex disease. Also, chronic diastolic heart failure, hypertension, suspected sleep apnea, and obesity  He complains of a 4-6 week history of progressive bilateral lower extremity swelling, left greater than right, with orthopnea, cough, and wheezing. He is having difficulty sleeping because of dyspnea. He is independently taking higher doses of furosemide to help with the lower extremity swelling. He denies nonsteroidal use. He has been no change in his diet, specifically denying excessive salt intake. He has not had chest pain. He states compliance with his medical regimen as listed.   Past Medical History:  Diagnosis Date  . Adenomatous colon polyp 04/2011  . CAD (coronary artery disease)    a. 01/2015 DES to LAD  b. 12/2015: [place] 85% oLCx lesion--> rx therapy.  . Chronic diastolic CHF (congestive heart failure) ([place])   . CKD (chronic kidney disease), stage II    a. probable CKD II-III with baseline CR 1.1-1.3.  .  DM type 2 (diabetes mellitus, type 2), insulin dependent 01/29/2014   fasting cbg 50-120 with new regimen  . Dyslipidemia, goal LDL below 70 01/29/2014  . Enlarged prostate   . Essential tremor    a. on proprnolol  . GERD (gastroesophageal reflux disease)   . Hypertension   . Internal hemorrhoid   . Sleep apnea    does not use cpap (06/04/2015)  . TIA (transient ischemic attack) 2002    Past Surgical History:  Procedure Laterality Date  . BACK SURGERY    . CARDIAC CATHETERIZATION  01/28/14   + CAD treat medically  . CARDIAC CATHETERIZATION N/A 12/21/2015   Procedure: Right/Left Heart Cath and Coronary Angiography;  Surgeon: Belva Crome, MD;  Location: [place] [facility];  Service: Cardiovascular;  Laterality: N/A;  . CARDIAC CATHETERIZATION N/A 11/02/2016   Procedure: Left Heart Cath and Coronary Angiography;  Surgeon: Nelva Bush, MD;  Location: [facility];  Service: Cardiovascular;  Laterality: N/A;  . CATARACT EXTRACTION Bilateral   . CATARACT EXTRACTION, BILATERAL Bilateral   . CORONARY ANGIOPLASTY WITH STENT PLACEMENT  01/30/2015   DES [place]  [place] to LAD by Dr Tamala Julian  . CORONARY ARTERY BYPASS GRAFT N/A 11/09/2016   Procedure: CORONARY ARTERY BYPASS GRAFTING (CABG) x 2;  Surgeon: Melrose Nakayama, MD;  Location: [facility];  Service: Open Heart Surgery;  Laterality: N/A;  . ENDOVEIN HARVEST OF GREATER SAPHENOUS VEIN Left 11/09/2016   Procedure: ENDOVEIN HARVEST OF GREATER SAPHENOUS VEIN;  Surgeon: Melrose Nakayama, MD;  Location: [place];  Service: Open Heart Surgery;  Laterality: Left;  . JOINT  REPLACEMENT    . LEFT HEART CATHETERIZATION WITH CORONARY ANGIOGRAM N/A 01/28/2014   Procedure: LEFT HEART CATHETERIZATION WITH CORONARY ANGIOGRAM;  Surgeon: Sinclair Grooms, MD;  Location: [facility];  Service: Cardiovascular;  Laterality: N/A;  . LEFT HEART CATHETERIZATION WITH CORONARY ANGIOGRAM N/A 01/30/2015   Procedure: LEFT HEART CATHETERIZATION WITH CORONARY ANGIOGRAM;   Surgeon: Belva Crome, MD;  Location: [facility];  Service: Cardiovascular;  Laterality: N/A;  . LUMBAR [place]    . SHOULDER ARTHROSCOPY Right 07/30/2014   Procedure: Right Shoulder Arthroscopy, Debridement, Decompression, Manipulation Under Anesthesia;  Surgeon: Newt Minion, MD;  Location: [place];  Service: Orthopedics;  Laterality: Right;  . TEE WITHOUT CARDIOVERSION N/A 11/09/2016   Procedure: TRANSESOPHAGEAL ECHOCARDIOGRAM (TEE);  Surgeon: Melrose Nakayama, MD;  Location: [place];  Service: Open Heart Surgery;  Laterality: N/A;  . TOTAL KNEE ARTHROPLASTY Bilateral     Current Medications: Outpatient Medications Prior to Visit  Medication Sig Dispense Refill  . acetaminophen (TYLENOL) 500 MG tablet Take 2 tablets (1,000 mg total) by mouth every 6 (six) hours as needed. (Patient taking differently: Take 1,000 mg by mouth every 6 (six) hours as needed for mild pain. ) 30 tablet 0  . amLODipine (NORVASC) 5 MG tablet Take 1 tablet (5 mg total) by mouth daily. 90 tablet 3  . aspirin EC 81 MG tablet Take 81 mg by mouth daily.    . butalbital-acetaminophen-caffeine (FIORICET, ESGIC) 50-325-40 MG tablet Take 1 tablet by mouth every 6 (six) hours as needed for headache. 20 tablet 0  . clonazePAM (KLONOPIN) 0.5 MG tablet Take 1 tablet (0.5 mg total) by mouth 2 (two) times daily as needed (tremors). 45 tablet 3  . clopidogrel (PLAVIX) 75 MG tablet Take 1 tablet (75 mg total) by mouth daily. 90 tablet 3  . diclofenac sodium (VOLTAREN) 1 % GEL Apply 2 g topically 4 (four) times daily as needed (for pain).     . furosemide (LASIX) 40 MG tablet take 1 tablet by mouth once daily 90 tablet 1  . gabapentin (NEURONTIN) 300 MG capsule Take 1 capsule (300 mg total) by mouth 2 (two) times daily.    . insulin aspart (NOVOLOG) 100 UNIT/ML injection Inject 40 Units into the skin 3 (three) times daily before meals.    . insulin NPH-regular Human (NOVOLIN 70/30) (70-30) 100 UNIT/ML injection Use up to 120  units a day under skin (Patient taking differently: Inject 60 Units into the skin every evening. ) 40 mL 11  . Polyvinyl Alcohol-Povidone (REFRESH OP) Place 1 drop into both eyes daily as needed (for dry eyes). Reported on 12/25/2015    . potassium chloride SA (K-DUR,KLOR-CON) 20 MEQ tablet take 1 tablet by mouth once daily 30 tablet 11  . propranolol (INDERAL) 40 MG tablet Take 40 mg by mouth 3 (three) times daily.  0  . psyllium (METAMUCIL) 58.6 % packet Take 1 packet by mouth daily as needed (constipation).    . VENTOLIN HFA 108 (90 Base) MCG/ACT inhaler inhale 2 puffs by mouth every 6 hours if needed for wheezing or shortness of breath 18 g 1   Facility-Administered Medications Prior to Visit  Medication Dose Route Frequency Provider Last Rate Last Dose  . 0.9 %  sodium chloride infusion  500 mL Intravenous Continuous Ladene Artist, MD         Allergies:   Pneumococcal vaccines   Social History   Social History  . Marital status: Married  Spouse name: N/A  . Number of children: 0  . Years of education: N/A   Occupational History  . retired    Social History Main Topics  . Smoking status: Never Smoker  . Smokeless tobacco: Never Used  . Alcohol use No  . Drug use: No  . Sexual activity: No   Other Topics Concern  . None   Social History Narrative   He is a Geophysicist/field seismologist by trade.   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   He lives alone.  His wife is living in [place] at this time.  They do not have children.   Highest level of education:  College graduate     Family History:  The patient's family history includes Alzheimer's disease in his sister; Asthma in his brother; CAD in his brother and sister; Diabetes in his other; Prostate cancer in his brother.  ROS:   Please see the history of present illness.      Unexplained weight gain, leg swelling left greater than right, dyspnea on exertion, orthopnea, cough, vision disturbance, muscle  pains and aches. Anxiety. Wheezing. Cough without phlegm production, leg pain related to swelling, fatigue, and sweating.  All other systems reviewed and are negative.   PHYSICAL EXAM:   VS:  BP (!) 146/60 (BP Location: Left Arm)   Pulse 81   Ht 5\' 8"  (1.727 m)   Wt 279 lb 12.8 oz (126.9 kg)   BMI 42.54 kg/m    GEN: Well nourished, well developed, in no acute distress  HEENT: normal  Neck: Difficult to assess but suspicion of elevation to angle of the jaw. No carotid bruits, or masses Cardiac: RRR; no murmurs, rubs, or gallops. Severe bilateral pitting edema left leg greater than right from the ankles to the knee. Respiratory:  Decreased breath sounds at the right base. GI: soft, nontender, nondistended, + BS MS: no deformity or atrophy  Skin: warm and dry, no rash Neuro:  Alert and Oriented x 3, Strength and sensation are intact Psych: euthymic mood, full affect  Wt Readings from Last 3 Encounters:  06/06/17 279 lb 12.8 oz (126.9 kg)  05/16/17 281 lb (127.5 kg)  04/25/17 271 lb (122.9 kg)      Studies/Labs Reviewed:   EKG:  EKG  Sinus rhythm, first-degree AV block, QS pattern V1 through V3 on heart tracing performed 05/22/2017.  Recent Labs: 10/29/2016: B Natriuretic Peptide 21.4; TSH 2.344 11/10/2016: Magnesium 1.9 01/02/2017: ALT 21 05/22/2017: BUN 15; Creatinine, Ser 1.17; Hemoglobin 12.2; Platelets 228; Potassium 4.0; Sodium 136   Lipid Panel    Component Value Date/Time   CHOL 166 02/06/2017 0147   TRIG 146 02/06/2017 0147   HDL 30 (L) 02/06/2017 0147   CHOLHDL 5.5 02/06/2017 0147   VLDL 29 02/06/2017 0147   LDLCALC 107 (H) 02/06/2017 0147   LDLDIRECT 144.0 07/08/2015 1344    Additional studies/ records that were reviewed today include:  2-D Doppler echocardiogram January 2018: Study Conclusions  - Left ventricle: The cavity size was normal. Wall thickness was   increased in a pattern of moderate LVH. Systolic function was   normal. The estimated ejection  fraction was in the range of 60%   to 65%. Wall motion was normal; there were no regional wall   motion abnormalities. Doppler parameters are consistent with   abnormal left ventricular relaxation (grade 1 diastolic   dysfunction). - Aortic valve: There was no stenosis. - Mitral valve: Mildly calcified annulus. There was  trivial   regurgitation. - Left atrium: The atrium was mildly dilated. - Right ventricle: The cavity size was mildly dilated. Systolic   function was normal. - Right atrium: The atrium was mildly dilated. - Tricuspid valve: Peak RV-RA gradient (S): 34 mm Hg. - Pulmonary arteries: PA peak pressure: 37 mm Hg (S). - Inferior vena cava: The vessel was normal in size. The   respirophasic diameter changes were in the normal range (>= 50%),   consistent with normal central venous pressure.  Impressions:  - Normal LV size with moderate LV hypertrophy. EF 60-65%. Mildly   dilated RV with normal systolic function. No significant valvular   abnormalities. Mild pulmonary hypertension.   ASSESSMENT:    1. Acute on chronic diastolic heart failure   2. Essential hypertension   3. S/P CABG x 2   4. Hyperlipidemia LDL goal <70   5. Type 2 diabetes mellitus with other circulatory complication, with long-term current use of insulin ([place])   6.      Essential tremor for which in the relative is being used. I have tried in the past to switch to metoprolol or carvedilol due to his           chronic heart disease. These agents do not work as well and therefore we have settled.   PLAN:  In order of problems listed above:  1. Clearly volume overloaded. Weight is up 15 pounds since mid April when he weighed 264. This is been a subacute development. Not sure what the precipitant is. Could simply be related to inadequate volume removal. Rule out anemia, dietary indiscretion, or other secondary causes. We should repeat his Echocardiogram to exclude new systolic dysfunction. EF prior to  surgery in January was 60%. Plan aggressive IV diuresis starting with 80 mg of furosemide every 12 hours. Monitor urine output and weights closely. 2. Diuresis will help improve blood pressure control. 3. No evidence of ongoing ischemia based on symptoms. Will repeat EKG and compared to last one performed 2 weeks ago. We'll also check a troponin echo will be done to look for regional wall motion abnormality.  4. LDL target is less than 70. A statin is not listed on his mid profile. This needs to be started and the target achieved. Last LDL was 107 and April 2018. 5. The patient has, complicated diabetes mellitus with vascular complications. In addition to his typical medications we will write for sliding scale while hospitalized. 6. Continue and around for essential tremor.  Overall, the patient is in congestive heart failure. I believe this is acute on chronic diastolic heart failure. He needs aggressive diuresis. He is 1315 pounds above his weight and April 2015. He has significant debility related to dyspnea and has not been sleeping well due to orthopnea. Repeat echo will be done to exclude systolic dysfunction. Other laboratory data including hemoglobin, TSH, electrolytes will be performed to exclude other issues could be contributing.

## 2017-06-07 ENCOUNTER — Inpatient Hospital Stay (HOSPITAL_COMMUNITY): Payer: Medicare Other

## 2017-06-07 DIAGNOSIS — I5033 Acute on chronic diastolic (congestive) heart failure: Secondary | ICD-10-CM

## 2017-06-07 DIAGNOSIS — I25810 Atherosclerosis of coronary artery bypass graft(s) without angina pectoris: Secondary | ICD-10-CM

## 2017-06-07 LAB — TROPONIN I

## 2017-06-07 LAB — BASIC METABOLIC PANEL
ANION GAP: 8 (ref 5–15)
BUN: 12 mg/dL (ref 6–20)
CHLORIDE: 102 mmol/L (ref 101–111)
CO2: 30 mmol/L (ref 22–32)
Calcium: 8.8 mg/dL — ABNORMAL LOW (ref 8.9–10.3)
Creatinine, Ser: 1.09 mg/dL (ref 0.61–1.24)
Glucose, Bld: 208 mg/dL — ABNORMAL HIGH (ref 65–99)
POTASSIUM: 3.9 mmol/L (ref 3.5–5.1)
SODIUM: 140 mmol/L (ref 135–145)

## 2017-06-07 LAB — GLUCOSE, CAPILLARY
GLUCOSE-CAPILLARY: 221 mg/dL — AB (ref 65–99)
GLUCOSE-CAPILLARY: 205 mg/dL — AB (ref 65–99)
Glucose-Capillary: 189 mg/dL — ABNORMAL HIGH (ref 65–99)

## 2017-06-07 LAB — HEMOGLOBIN A1C
HEMOGLOBIN A1C: 9.8 % — AB (ref 4.8–5.6)
MEAN PLASMA GLUCOSE: 235 mg/dL

## 2017-06-07 MED ORDER — GI COCKTAIL ~~LOC~~: 30.0000 mL | Freq: Once | ORAL | Status: DC

## 2017-06-07 MED ORDER — OXYCODONE HCL 5 MG PO TABS: 5.0000 mg | ORAL_TABLET | Freq: Once | ORAL | Status: DC

## 2017-06-07 MED ORDER — ACETAMINOPHEN 325 MG PO TABS
650.0000 mg | ORAL_TABLET | ORAL | Status: DC | PRN
  Administered 2017-06-07 – 2017-06-09 (×5): 650 mg via ORAL
  Filled 2017-06-07 (×5): qty 2

## 2017-06-07 NOTE — Progress Notes (Signed)
Pt c/o CP 6/10 and dizziness.  VSS, EKG obtained, and CBG 222.  Within 10 minutes pt says CP is gone and dizziness subsided.  Pt back in bed, will notify PA and continue to monitor.

## 2017-06-07 NOTE — Progress Notes (Signed)
Progress Note  Patient Name: Edward [name] Date of Encounter: 06/07/2017  Primary Cardiologist: Dr. Tamala Julian  Subjective   Pt reports lower extremity swelling but less shortness of breath.   Inpatient Medications    Scheduled Meds: . amLODipine  5 mg Oral Daily  . aspirin EC  81 mg Oral Daily  . clopidogrel  75 mg Oral Daily  . enoxaparin (LOVENOX) injection  40 mg Subcutaneous Q24H  . furosemide  80 mg Intravenous Q12H  . gabapentin  300 mg Oral BID  . insulin aspart  40 Units Subcutaneous TID AC  . insulin aspart protamine- aspart  60 Units Subcutaneous Q supper  . polyvinyl alcohol  1 drop Both Eyes BID  . potassium chloride  20 mEq Oral BID  . propranolol  40 mg Oral TID  . psyllium  1 packet Oral Daily  . sodium chloride flush  3 mL Intravenous Q12H  . tamsulosin  0.4 mg Oral QHS   Continuous Infusions: . sodium chloride     PRN Meds: sodium chloride, acetaminophen, albuterol, butalbital-acetaminophen-caffeine, clonazePAM, ondansetron (ZOFRAN) IV, sodium chloride flush   Vital Signs    Vitals:   06/06/17 1745 06/06/17 2037 06/07/17 0533  BP: 135/61 (!) 156/65 (!) 148/60  Pulse: 81 77 69  Resp: 16 18 18   Temp: 98.4 F (36.9 C) 98.1 F (36.7 C) 97.6 F (36.4 C)  TempSrc: Oral Oral Oral  SpO2: 99% 100% 100%  Weight: 278 lb 3.5 oz (126.2 kg)  273 lb 14.4 oz (124.2 kg)  Height: 5\' 8"  (1.727 m)      Intake/Output Summary (Last 24 hours) at 06/07/17 0817 Last data filed at 06/07/17 0600  Gross per 24 hour  Intake              480 ml  Output             1500 ml  Net            -1020 ml   Filed Weights   06/06/17 1745 06/07/17 0533  Weight: 278 lb 3.5 oz (126.2 kg) 273 lb 14.4 oz (124.2 kg)    Telemetry    Sinus rhythm with first degree HB - Personally Reviewed  ECG    Sinus rhythm with 1st degree HB - Personally Reviewed  Physical Exam   GEN: No acute distress.   Neck: No JVD Cardiac: RRR, no murmurs, rubs, or gallops.  Respiratory: Clear to  auscultation bilaterally, but diminished in bases, respirations unlabored GI: Soft, nontender, non-distended  MS: trace to 1+ B LE edema; No deformity. Neuro:  Nonfocal  Psych: Normal affect   Labs    Chemistry Recent Labs Lab 06/06/17 1839 06/07/17 0618  NA 139 140  K 4.3 3.9  CL 101 102  CO2 32 30  GLUCOSE 359* 208*  BUN 11 12  CREATININE 1.18 1.09  CALCIUM 9.3 8.8*  PROT 6.4*  --   ALBUMIN 3.4*  --   AST 23  --   ALT 17  --   ALKPHOS 97  --   BILITOT 0.5  --   GFRNONAA 59* >60  GFRAA >60 >60  ANIONGAP 6 8     Hematology Recent Labs Lab 06/06/17 1839  WBC 6.9  RBC 4.53  HGB 12.6*  HCT 38.8*  MCV 85.7  MCH 27.8  MCHC 32.5  RDW 13.9  PLT 271    Cardiac Enzymes Recent Labs Lab 06/06/17 1839 06/06/17 2357 06/07/17 0618  TROPONINI <0.03 <0.03 <0.03  No results for input(s): TROPIPOC in the last 168 hours.   BNP Recent Labs Lab 06/06/17 1839  BNP 53.5     DDimer No results for input(s): DDIMER in the last 168 hours.   Radiology    Dg Chest 2 View  Result Date: 06/07/2017 CLINICAL DATA:  76 year old male with a history of shortness of breath and extremity swelling EXAM: CHEST  2 VIEW COMPARISON:  05/22/2017 FINDINGS: Cardiomediastinal silhouette unchanged. Surgical changes of median sternotomy. Evidence of prior coronary stenting. Low lung volumes with basilar atelectasis/scarring. No confluent airspace disease. No pleural effusion. No interlobular septal thickening. No pneumothorax. IMPRESSION: Low lung volumes, without evidence of acute cardiopulmonary disease. Surgical changes of prior median sternotomy. Evidence of prior PTCA Electronically Signed   By: Corrie Mckusick D.O.   On: 06/07/2017 07:41    Cardiac Studies   Echocardiogram 06/07/17: pending  Echocardiogram 11/02/16: Study Conclusions - Left ventricle: The cavity size was normal. Wall thickness was   increased in a pattern of moderate LVH. Systolic function was   normal. The estimated  ejection fraction was in the range of 60%   to 65%. Wall motion was normal; there were no regional wall   motion abnormalities. Doppler parameters are consistent with   abnormal left ventricular relaxation (grade 1 diastolic   dysfunction). - Aortic valve: There was no stenosis. - Mitral valve: Mildly calcified annulus. There was trivial   regurgitation. - Left atrium: The atrium was mildly dilated. - Right ventricle: The cavity size was mildly dilated. Systolic   function was normal. - Right atrium: The atrium was mildly dilated. - Tricuspid valve: Peak RV-RA gradient (S): 34 mm Hg. - Pulmonary arteries: PA peak pressure: 37 mm Hg (S). - Inferior vena cava: The vessel was normal in size. The   respirophasic diameter changes were in the normal range (>= 50%),   consistent with normal central venous pressure.  Impressions: - Normal LV size with moderate LV hypertrophy. EF 60-65%. Mildly   dilated RV with normal systolic function. No significant valvular   abnormalities. Mild pulmonary hypertension.    Patient Profile     76 y.o. male with a history of Morbid obesity, obstructive sleep apnea, type 2 diabetes with complications, coronary artery disease with two-vessel coronary bypass grafting in January 0630, chronic diastolic heart failure, stage III chronic kidney disease, was admitted to the hospital with a 6 week history of progressive weight gain, edema, and dyspnea. He was admitted from the clinic to the hospital with acute on chronic diastolic heart failure.  Assessment & Plan    1. Acute on chronic diastolic heart failure - dry weight is 264 lbs; weight on admission was 278 lbs - he is diuresing on 80 mg IV lasix BID - he is overall net negative 1L with 1.5 L urine output since admission yesterday afternoon - K and Cr are stable - continue with this diuretic regimen - home regimen is 40 mg PO lasix daily - plan to repeat echo today   2. HTN - continue norvasc -  losartan on hold during aggressive diuresis  - BP 130-150s - continue to monitor and add losartan as needed   3. CAD s/p CABG x 2 (11/09/16) - continue ASA and plavix   4. DM - she is on novolog with meals - will add SSI   5. Essential tremor - on propranolol - has tried lopressor and coreg, but these did not help control his tremor   Signed, Ledora Bottcher,  PA  06/07/2017, 8:17 AM    I have seen and examined the patient along with Ledora Bottcher, PA .  I have reviewed the chart, notes and new data.  I agree with PA/NP's note.  Key new complaints: breathing has improved already, less edematous Key examination changes: ankle/shin edema, otw normal CV exam Key new findings / data: labs, ECG unchanged from previous  PLAN: Continue IV diuretics to reach "dry weight". He is compliant with meds and generally compliant with sodium restriction. Discussed avoiding not just added salt, but also foods that are already sodium rich. Reviewed the importance of daily weights and diuretic dose adjustment.  Sanda Klein, MD, [place] [phone] 06/07/2017, 1:01 PM

## 2017-06-07 NOTE — Care Management Note (Signed)
Case Management Note  Patient Details  Name: Edward [name] MRN: [id number] Date of Birth: 02/17/1941  Subjective/Objective:                 Patient from home alone, admitted for progressive weight gain, edema, and dyspnea. Receiving cardio pulm rehab pta. Cardiologist Daneen Schick PCP [employer] Coverage through Medicare ans supplemental policy.   Action/Plan:  CM will continue to follow for DC planning needs.  Expected Discharge Date:  06/08/17               Expected Discharge Plan:  Home/Self Care  In-House Referral:     Discharge planning Services  CM Consult  Post Acute Care Choice:    Choice offered to:     DME Arranged:    DME Agency:     HH Arranged:    HH Agency:     Status of Service:  In process, will continue to follow  If discussed at Long Length of Stay Meetings, dates discussed:    Additional Comments:  Carles Collet, RN 06/07/2017, 10:04 AM

## 2017-06-07 NOTE — Progress Notes (Deleted)
Pt. C/o mid sternal pain unrelieved with Tylenol.On call for Cardiology paged to make aware.  

## 2017-06-08 ENCOUNTER — Inpatient Hospital Stay (HOSPITAL_COMMUNITY): Payer: Medicare Other

## 2017-06-08 DIAGNOSIS — I361 Nonrheumatic tricuspid (valve) insufficiency: Secondary | ICD-10-CM

## 2017-06-08 LAB — ECHOCARDIOGRAM COMPLETE
Ao-asc: 34 cm
CHL CUP TV REG PEAK VELOCITY: 232 cm/s
EWDT: 282 ms
FS: 33 % (ref 28–44)
HEIGHTINCHES: 68 in
IV/PV OW: 1.07
LA ID, A-P, ES: 42 mm
LA diam index: 1.69 cm/m2
LA vol A4C: 54 ml
LA vol index: 22.2 mL/m2
LAVOL: 55.2 mL
LEFT ATRIUM END SYS DIAM: 42 mm
LVOT area: 2.54 cm2
LVOTD: 18 mm
MV Dec: 282
MV pk E vel: 0.9 m/s
PW: 15 mm — AB (ref 0.6–1.1)
TR max vel: 232 cm/s
Weight: 4324.8 oz

## 2017-06-08 LAB — BASIC METABOLIC PANEL
ANION GAP: 8 (ref 5–15)
BUN: 13 mg/dL (ref 6–20)
CALCIUM: 8.8 mg/dL — AB (ref 8.9–10.3)
CO2: 32 mmol/L (ref 22–32)
Chloride: 100 mmol/L — ABNORMAL LOW (ref 101–111)
Creatinine, Ser: 1.16 mg/dL (ref 0.61–1.24)
GFR calc Af Amer: >60 mL/min (ref >60)
GFR, EST NON AFRICAN AMERICAN: 60 mL/min — AB (ref >60)
GLUCOSE: 100 mg/dL — AB (ref 65–99)
POTASSIUM: 3.4 mmol/L — AB (ref 3.5–5.1)
Sodium: 140 mmol/L (ref 135–145)

## 2017-06-08 LAB — GLUCOSE, CAPILLARY
GLUCOSE-CAPILLARY: 134 mg/dL — AB (ref 65–99)
Glucose-Capillary: 138 mg/dL — ABNORMAL HIGH (ref 65–99)
Glucose-Capillary: 237 mg/dL — ABNORMAL HIGH (ref 65–99)
Glucose-Capillary: 173 mg/dL — ABNORMAL HIGH (ref 65–99)
Glucose-Capillary: 262 mg/dL — ABNORMAL HIGH (ref 65–99)
Glucose-Capillary: 123 mg/dL — ABNORMAL HIGH (ref 65–99)

## 2017-06-08 MED ORDER — MECLIZINE HCL 25 MG PO TABS
25.0000 mg | ORAL_TABLET | Freq: Two times a day (BID) | ORAL | Status: DC | PRN
  Administered 2017-06-08: 25 mg via ORAL
  Filled 2017-06-08: qty 1

## 2017-06-08 MED ORDER — INSULIN ASPART PROT & ASPART (70-30 MIX) 100 UNIT/ML ~~LOC~~ SUSP
60.0000 [IU] | Freq: Every day | SUBCUTANEOUS | Status: DC
  Administered 2017-06-08 – 2017-06-09 (×2): 60 [IU] via SUBCUTANEOUS
  Filled 2017-06-08 (×2): qty 10

## 2017-06-08 MED ORDER — FUROSEMIDE 80 MG PO TABS
80.0000 mg | ORAL_TABLET | Freq: Two times a day (BID) | ORAL | Status: DC
  Administered 2017-06-08 – 2017-06-09 (×3): 80 mg via ORAL
  Administered 2017-06-10: 40 mg via ORAL
  Filled 2017-06-08 (×4): qty 1

## 2017-06-08 MED ORDER — ALUM & MAG HYDROXIDE-SIMETH 200-200-20 MG/5ML PO SUSP
30.0000 mL | ORAL | Status: DC | PRN
  Administered 2017-06-08: 30 mL via ORAL
  Filled 2017-06-08: qty 30

## 2017-06-08 MED ORDER — POTASSIUM CHLORIDE CRYS ER 20 MEQ PO TBCR
30.0000 meq | EXTENDED_RELEASE_TABLET | Freq: Two times a day (BID) | ORAL | Status: DC
  Administered 2017-06-08 – 2017-06-09 (×3): 30 meq via ORAL
  Filled 2017-06-08 (×3): qty 1

## 2017-06-08 MED ORDER — PANTOPRAZOLE SODIUM 40 MG PO TBEC
40.0000 mg | DELAYED_RELEASE_TABLET | Freq: Every day | ORAL | Status: DC
  Administered 2017-06-08 – 2017-06-10 (×3): 40 mg via ORAL
  Filled 2017-06-08 (×3): qty 1

## 2017-06-08 MED ORDER — PERFLUTREN LIPID MICROSPHERE
1.0000 mL | INTRAVENOUS | Status: AC | PRN
  Administered 2017-06-08: 2 mL via INTRAVENOUS
  Filled 2017-06-08: qty 10

## 2017-06-08 NOTE — Progress Notes (Signed)
Late entry from 06/07/17 during computer down time  Nutrition Education Note  RD consulted for nutrition education regarding new onset CHF.  RD provided "Low Sodium Nutrition Therapy" handout from the Academy of [employer]. Reviewed patient's dietary recall. Provided examples on ways to decrease sodium intake in diet. Discouraged intake of processed foods and use of salt shaker. Encouraged fresh fruits and vegetables as well as whole grain sources of carbohydrates to maximize fiber intake.   RD discussed why it is important for patient to adhere to diet recommendations, and emphasized the role of fluids, foods to avoid, and importance of weighing self daily. Teach back method used.  Expect good compliance.  Body mass index is 41.1 kg/m. Pt meets criteria for morbid obesity based on current BMI.  Current diet order is Carb Modified, patient is consuming approximately 100% of meals at this time. Labs and medications reviewed. No further nutrition interventions warranted at this time. RD contact information provided. If additional nutrition issues arise, please re-consult RD.   Recommend addition of HEART HEALTHY to current diet order  Kerman Passey MS, RD, LDN [phone] Pager  [phone] Weekend/On-Call Pager

## 2017-06-08 NOTE — Progress Notes (Signed)
Progress Note  Patient Name: Edward [name] Date of Encounter: 06/08/2017  Primary Cardiologist: Dr. Tamala Julian  Subjective   Pt continues to complain of intermittent dizziness that he thinks coincides with lasix administration. He states that he gets dizzy during every hospitalization and that this normalizes when he gets home.  Inpatient Medications    Scheduled Meds: . amLODipine  5 mg Oral Daily  . aspirin EC  81 mg Oral Daily  . clopidogrel  75 mg Oral Daily  . enoxaparin (LOVENOX) injection  40 mg Subcutaneous Q24H  . furosemide  80 mg Intravenous Q12H  . gabapentin  300 mg Oral BID  . insulin aspart  40 Units Subcutaneous TID AC  . insulin aspart protamine- aspart  60 Units Subcutaneous Q supper  . oxyCODONE  5 mg Oral Once  . polyvinyl alcohol  1 drop Both Eyes BID  . potassium chloride  20 mEq Oral BID  . propranolol  40 mg Oral TID  . psyllium  1 packet Oral Daily  . sodium chloride flush  3 mL Intravenous Q12H  . tamsulosin  0.4 mg Oral QHS   Continuous Infusions: . sodium chloride     PRN Meds: sodium chloride, acetaminophen, albuterol, alum & mag hydroxide-simeth, butalbital-acetaminophen-caffeine, clonazePAM, ondansetron (ZOFRAN) IV, sodium chloride flush   Vital Signs    Vitals:   06/07/17 2019 06/08/17 0127 06/08/17 0617 06/08/17 0943  BP: 131/75 128/72 (!) 150/63 135/65  Pulse: 68 (!) 59 (!) 59 65  Resp: 18 20 20    Temp: 98.7 F (37.1 C) 98.4 F (36.9 C) 97.6 F (36.4 C)   TempSrc: Oral Oral Oral   SpO2: 100% 100% 100% 100%  Weight:   270 lb 4.8 oz (122.6 kg)   Height:        Intake/Output Summary (Last 24 hours) at 06/08/17 1004 Last data filed at 06/08/17 0934  Gross per 24 hour  Intake              480 ml  Output             2675 ml  Net            -2195 ml   Filed Weights   06/06/17 1745 06/07/17 0533 06/08/17 0617  Weight: 278 lb 3.5 oz (126.2 kg) 273 lb 14.4 oz (124.2 kg) 270 lb 4.8 oz (122.6 kg)     Physical Exam   General: Well  developed, well nourished, male appearing in no acute distress. Head: Normocephalic, atraumatic.  Neck: Supple without bruits, + JVD. Lungs:  Resp regular and unlabored, CTA. Heart: RRR, S1, S2, no S3, S4, or murmur; no rub. Abdomen: Soft, non-tender, non-distended with normoactive bowel sounds. No hepatomegaly. No rebound/guarding. No obvious abdominal masses. Extremities: No clubbing, cyanosis, trace to 1+ edema. Distal pedal pulses are 2+ bilaterally. Neuro: Alert and oriented X 3. Moves all extremities spontaneously. Psych: Normal affect.  Labs    Chemistry Recent Labs Lab 06/06/17 1839 06/07/17 0618 06/08/17 0242  NA 139 140 140  K 4.3 3.9 3.4*  CL 101 102 100*  CO2 32 30 32  GLUCOSE 359* 208* 100*  BUN 11 12 13   CREATININE 1.18 1.09 1.16  CALCIUM 9.3 8.8* 8.8*  PROT 6.4*  --   --   ALBUMIN 3.4*  --   --   AST 23  --   --   ALT 17  --   --   ALKPHOS 97  --   --  BILITOT 0.5  --   --   GFRNONAA 59* >60 60*  GFRAA >60 >60 >60  ANIONGAP 6 8 8      Hematology Recent Labs Lab 06/06/17 1839  WBC 6.9  RBC 4.53  HGB 12.6*  HCT 38.8*  MCV 85.7  MCH 27.8  MCHC 32.5  RDW 13.9  PLT 271    Cardiac Enzymes Recent Labs Lab 06/06/17 1839 06/06/17 2357 06/07/17 0618  TROPONINI <0.03 <0.03 <0.03   No results for input(s): TROPIPOC in the last 168 hours.   BNP Recent Labs Lab 06/06/17 1839  BNP 53.5     DDimer No results for input(s): DDIMER in the last 168 hours.   Radiology    Dg Chest 2 View  Result Date: 06/07/2017 CLINICAL DATA:  76 year old male with a history of shortness of breath and extremity swelling EXAM: CHEST  2 VIEW COMPARISON:  05/22/2017 FINDINGS: Cardiomediastinal silhouette unchanged. Surgical changes of median sternotomy. Evidence of prior coronary stenting. Low lung volumes with basilar atelectasis/scarring. No confluent airspace disease. No pleural effusion. No interlobular septal thickening. No pneumothorax. IMPRESSION: Low lung  volumes, without evidence of acute cardiopulmonary disease. Surgical changes of prior median sternotomy. Evidence of prior PTCA Electronically Signed   By: Corrie Mckusick D.O.   On: 06/07/2017 07:41     Telemetry    Sinus rhythm with 1st degree heart block - Personally Reviewed  ECG    No new tracings - Personally Reviewed   Cardiac Studies   Echocardiogram 06/07/17: pending  Echocardiogram 11/02/16: Study Conclusions - Left ventricle: The cavity size was normal. Wall thickness was increased in a pattern of moderate LVH. Systolic function was normal. The estimated ejection fraction was in the range of 60% to 65%. Wall motion was normal; there were no regional wall motion abnormalities. Doppler parameters are consistent with abnormal left ventricular relaxation (grade 1 diastolic dysfunction). - Aortic valve: There was no stenosis. - Mitral valve: Mildly calcified annulus. There was trivial regurgitation. - Left atrium: The atrium was mildly dilated. - Right ventricle: The cavity size was mildly dilated. Systolic function was normal. - Right atrium: The atrium was mildly dilated. - Tricuspid valve: Peak RV-RA gradient (S): 34 mm Hg. - Pulmonary arteries: PA peak pressure: 37 mm Hg (S). - Inferior vena cava: The vessel was normal in size. The respirophasic diameter changes were in the normal range (>= 50%), consistent with normal central venous pressure.  Impressions: - Normal LV size with moderate LV hypertrophy. EF 60-65%. Mildly dilated RV with normal systolic function. No significant valvular abnormalities. Mild pulmonary hypertension.  Patient Profile     76 y.o. male with a history of Morbid obesity, obstructive sleep apnea, type 2 diabetes with complications, coronary artery disease with two-vessel coronary bypass grafting in January 7672, chronic diastolic heart failure, stage III chronic kidney disease, was admitted to the hospital with a 6  week history of progressive weight gain, edema, and dyspnea. He was admitted from the clinic to the hospital with acute on chronic diastolic heart failure.  Assessment & Plan    1. Acute on chronic diastolic heart failure - dry weight is 264 lbs, weight is down to 270 lbs today - will plan to transition to PO lasix today and monitor - he is overall net negative  - sCr remain stable, hypokalemic - will increase PO replacement - repeat echo pending   2. Dizziness - pt continues to c/o intermittent dizziness when he changes positions and coincides with lasix adminisration -  will obtain orthostatics - HR is bradycardic in the 50s - he has been stable on his current dose of propranolol for over a year, so suspect this HR is his baseline; HR 68 in clinic on 12/23/16   3. HTN - continue norvasc - continue holding losartan with diuresis   4. CAD s/p CABG x 2 (11/09/16) - continue ASA and plavix   5. DM - continue current regimen   6. Essential tremor - propranolol   Signed, Ledora Bottcher , [place] 10:04 AM 06/08/2017 Pager: [phone]  I have seen and examined the patient along with Ledora Bottcher, PA.  I have reviewed the chart, notes and new data.  I agree with PA's note.  Key new complaints: Breathing and edema have improved rapidly Key examination changes: Jugular venous pulsations are virtually normal and edema is markedly improved, clear lungs Key new findings / data: Lower blood pressure noted as described above, associated with furosemide administration. Echo in progress, will review for evidence of elevated left atrial pressure.  PLAN: Agree with switching to oral diuretics today, with plan for discharge tomorrow if all goes well. Again reviewed the importance of sodium restriction, hidden sodium and certain foods, daily weight monitoring. Asked him to promptly call his physician to report weight gain more than 3 pounds in one day or 5 pounds in one week and not  wait for severe symptoms such as dyspnea.  Sanda Klein, MD, [place] [phone] 06/08/2017, 12:09 PM

## 2017-06-08 NOTE — Progress Notes (Signed)
  Echocardiogram 2D Echocardiogram has been performed.  Randa Lynn Dillie Burandt 06/08/2017, 12:23 PM

## 2017-06-09 ENCOUNTER — Encounter (HOSPITAL_COMMUNITY): Payer: Medicare Other

## 2017-06-09 LAB — GLUCOSE, CAPILLARY
GLUCOSE-CAPILLARY: 120 mg/dL — AB (ref 65–99)
GLUCOSE-CAPILLARY: 223 mg/dL — AB (ref 65–99)
GLUCOSE-CAPILLARY: 147 mg/dL — AB (ref 65–99)
Glucose-Capillary: 102 mg/dL — ABNORMAL HIGH (ref 65–99)
Glucose-Capillary: 131 mg/dL — ABNORMAL HIGH (ref 65–99)
Glucose-Capillary: 200 mg/dL — ABNORMAL HIGH (ref 65–99)

## 2017-06-09 LAB — BASIC METABOLIC PANEL
ANION GAP: 7 (ref 5–15)
BUN: 14 mg/dL (ref 6–20)
CO2: 30 mmol/L (ref 22–32)
Calcium: 8.6 mg/dL — ABNORMAL LOW (ref 8.9–10.3)
Chloride: 102 mmol/L (ref 101–111)
Creatinine, Ser: 1.23 mg/dL (ref 0.61–1.24)
GFR calc Af Amer: >60 mL/min (ref >60)
GFR, EST NON AFRICAN AMERICAN: 56 mL/min — AB (ref >60)
Glucose, Bld: 178 mg/dL — ABNORMAL HIGH (ref 65–99)
POTASSIUM: 4.3 mmol/L (ref 3.5–5.1)
Sodium: 139 mmol/L (ref 135–145)

## 2017-06-09 NOTE — Care Management Important Message (Signed)
Important Message  Patient Details  Name: Edward [name] MRN: [id number] Date of Birth: 06-18-1941   Medicare Important Message Given:  Yes    [name] 06/09/2017, 12:48 PM

## 2017-06-09 NOTE — Progress Notes (Signed)
Paged PA to get an order for TED hose. Order given, TED hose put on and will continue to monitor pt.

## 2017-06-09 NOTE — Progress Notes (Signed)
Progress Note  Patient Name: Edward [name] Date of Encounter: 06/09/2017  Primary Cardiologist: Tamala Julian  Subjective   Feels a little dizzy. However, he has been walking in the hallway with assistance. No orthopnea or exertional dyspnea. Still has lower extremity edema. Almost 5 L of net diuresis since admission. Despite negative fluid balance, weight reported increased 2 pounds today.  Inpatient Medications    Scheduled Meds: . amLODipine  5 mg Oral Daily  . aspirin EC  81 mg Oral Daily  . clopidogrel  75 mg Oral Daily  . enoxaparin (LOVENOX) injection  40 mg Subcutaneous Q24H  . furosemide  80 mg Oral BID  . gabapentin  300 mg Oral BID  . insulin aspart  40 Units Subcutaneous TID AC  . insulin aspart protamine- aspart  60 Units Subcutaneous QHS  . oxyCODONE  5 mg Oral Once  . pantoprazole  40 mg Oral Daily  . polyvinyl alcohol  1 drop Both Eyes BID  . potassium chloride  30 mEq Oral BID  . propranolol  40 mg Oral TID  . psyllium  1 packet Oral Daily  . sodium chloride flush  3 mL Intravenous Q12H  . tamsulosin  0.4 mg Oral QHS   Continuous Infusions: . sodium chloride     PRN Meds: sodium chloride, acetaminophen, albuterol, alum & mag hydroxide-simeth, butalbital-acetaminophen-caffeine, clonazePAM, meclizine, ondansetron (ZOFRAN) IV, sodium chloride flush   Vital Signs    Vitals:   06/08/17 1808 06/08/17 2000 06/09/17 0205 06/09/17 0615  BP: (!) 137/51 124/65  (!) 141/68  Pulse: 68 64  67  Resp:    20  Temp:  98.6 F (37 C)  98.3 F (36.8 C)  TempSrc:  Oral  Oral  SpO2:  100%    Weight:   272 lb 1.6 oz (123.4 kg)   Height:        Intake/Output Summary (Last 24 hours) at 06/09/17 0750 Last data filed at 06/09/17 0206  Gross per 24 hour  Intake              120 ml  Output             1950 ml  Net            -1830 ml   Filed Weights   06/07/17 0533 06/08/17 0617 06/09/17 0205  Weight: 273 lb 14.4 oz (124.2 kg) 270 lb 4.8 oz (122.6 kg) 272 lb 1.6 oz  (123.4 kg)    Telemetry    NSR - Personally Reviewed  ECG    No new tracing - Personally Reviewed  Physical Exam   GEN: No acute distress.   Neck: No JVD Cardiac: RRR, no murmurs, rubs, or gallops.  Respiratory: Clear to auscultation bilaterally. GI: Soft, nontender, non-distended  MS: 1+ bilateral pedal edema; No deformity. Neuro:  Nonfocal  Psych: Normal affect   Labs    Chemistry Recent Labs Lab 06/06/17 1839 06/07/17 0618 06/08/17 0242 06/09/17 0326  NA 139 140 140 139  K 4.3 3.9 3.4* 4.3  CL 101 102 100* 102  CO2 32 30 32 30  GLUCOSE 359* 208* 100* 178*  BUN 11 12 13 14   CREATININE 1.18 1.09 1.16 1.23  CALCIUM 9.3 8.8* 8.8* 8.6*  PROT 6.4*  --   --   --   ALBUMIN 3.4*  --   --   --   AST 23  --   --   --   ALT 17  --   --   --  ALKPHOS 97  --   --   --   BILITOT 0.5  --   --   --   GFRNONAA 59* >60 60* 56*  GFRAA >60 >60 >60 >60  ANIONGAP 6 8 8 7      Hematology Recent Labs Lab 06/06/17 1839  WBC 6.9  RBC 4.53  HGB 12.6*  HCT 38.8*  MCV 85.7  MCH 27.8  MCHC 32.5  RDW 13.9  PLT 271    Cardiac Enzymes Recent Labs Lab 06/06/17 1839 06/06/17 2357 06/07/17 0618  TROPONINI <0.03 <0.03 <0.03   No results for input(s): TROPIPOC in the last 168 hours.   BNP Recent Labs Lab 06/06/17 1839  BNP 53.5     DDimer No results for input(s): DDIMER in the last 168 hours.   Radiology    No results found.  Cardiac Studies   ECHO 06/08/2107 Left ventricle: The cavity size was normal. There was severe   concentric hypertrophy. Systolic function was normal. The   estimated ejection fraction was in the range of 60% to 65%. Wall   motion was normal; there were no regional wall motion   abnormalities. Doppler parameters are consistent with abnormal   left ventricular relaxation (grade 1 diastolic dysfunction).   There was no evidence of elevated ventricular filling pressure by   Doppler parameters. - Aortic valve: Trileaflet; normal thickness  leaflets. There was no   regurgitation. - Aortic root: The aortic root was normal in size. - Mitral valve: There was no regurgitation. - Left atrium: The atrium was mildly dilated. - Right ventricle: Systolic function was normal. - Tricuspid valve: There was mild regurgitation. - Pulmonary arteries: Systolic pressure was within the normal   range. - Inferior vena cava: The vessel was not visualized. - Pericardium, extracardiac: There was no pericardial effusion.  Patient Profile     76 y.o. male a history of obesity, obstructive sleep apnea, type 2 diabetes with complications, coronary artery disease with two-vessel coronary bypass grafting in January 7867, chronic diastolic heart failure, stage III chronic kidney disease, was admitted to the hospital with a 6 week history of progressive weight gain, edema, and dyspnea. He was admitted from the clinic to the hospital with acute on chronic diastolic heart failure.  Assessment & Plan    1. CHF: improved clinically, no longer with dyspnea and echo suggests normal/low LA pressure. Still has mild pedal edema. Dizzy and creatinine a little up. Try to keep fluids even. May just need a day to redistribute fluid from peripheral tissues and for [place] to figure out appropriate dose of diuretic at home. Monitor 24 hours, probably DC in AM. Reviewed once more the importance of daily weights and diuretic dose adjustment. 2. HTN: fair control, DBP is rater low which may explain his dizziness. May have to tolerate SBP in low 140s. 3. CAD s/p recent CABG:  No angina. 4. Essential tremor: on propranolol, not same benefit from carvedilol and metoprolol.  Signed, Sanda Klein, MD  06/09/2017, 7:50 AM

## 2017-06-10 ENCOUNTER — Encounter (HOSPITAL_COMMUNITY): Payer: Self-pay | Admitting: Nurse Practitioner

## 2017-06-10 ENCOUNTER — Other Ambulatory Visit: Payer: Self-pay | Admitting: Nurse Practitioner

## 2017-06-10 DIAGNOSIS — I5030 Unspecified diastolic (congestive) heart failure: Secondary | ICD-10-CM

## 2017-06-10 LAB — BASIC METABOLIC PANEL
ANION GAP: 8 (ref 5–15)
BUN: 14 mg/dL (ref 6–20)
CHLORIDE: 102 mmol/L (ref 101–111)
CO2: 29 mmol/L (ref 22–32)
Calcium: 8.7 mg/dL — ABNORMAL LOW (ref 8.9–10.3)
Creatinine, Ser: 1.25 mg/dL — ABNORMAL HIGH (ref 0.61–1.24)
GFR calc Af Amer: >60 mL/min (ref >60)
GFR, EST NON AFRICAN AMERICAN: 55 mL/min — AB (ref >60)
GLUCOSE: 156 mg/dL — AB (ref 65–99)
POTASSIUM: 3.9 mmol/L (ref 3.5–5.1)
Sodium: 139 mmol/L (ref 135–145)

## 2017-06-10 LAB — GLUCOSE, CAPILLARY
GLUCOSE-CAPILLARY: 203 mg/dL — AB (ref 65–99)
Glucose-Capillary: 162 mg/dL — ABNORMAL HIGH (ref 65–99)
Glucose-Capillary: 141 mg/dL — ABNORMAL HIGH (ref 65–99)

## 2017-06-10 MED ORDER — FUROSEMIDE 40 MG PO TABS: 40.0000 mg | ORAL_TABLET | Freq: Two times a day (BID) | ORAL | Status: DC

## 2017-06-10 MED ORDER — FUROSEMIDE 40 MG PO TABS: 40.0000 mg | ORAL_TABLET | Freq: Two times a day (BID) | ORAL | 6 refills | Status: DC

## 2017-06-10 MED ORDER — POTASSIUM CHLORIDE CRYS ER 20 MEQ PO TBCR
30.0000 meq | EXTENDED_RELEASE_TABLET | Freq: Every day | ORAL | Status: DC
  Administered 2017-06-10: 12:00:00 30 meq via ORAL
  Filled 2017-06-10: qty 1

## 2017-06-10 MED ORDER — INSULIN NPH ISOPHANE & REGULAR (70-30) 100 UNIT/ML ~~LOC~~ SUSP: 60.0000 [IU] | Freq: Every day | SUBCUTANEOUS | Status: DC

## 2017-06-10 MED ORDER — MECLIZINE HCL 25 MG PO TABS: 25.0000 mg | ORAL_TABLET | Freq: Two times a day (BID) | ORAL | 1 refills | Status: DC | PRN

## 2017-06-10 MED ORDER — POTASSIUM CHLORIDE CRYS ER 20 MEQ PO TBCR: 30.0000 meq | EXTENDED_RELEASE_TABLET | Freq: Every day | ORAL | 6 refills | Status: DC

## 2017-06-10 NOTE — Progress Notes (Signed)
Discharge instructions reviewed with patient, questions answered, verbalized understanding.  Patient transported to front of hospital via wheelchair to be taken home by brother.

## 2017-06-10 NOTE — Discharge Instructions (Signed)
**  PLEASE REMEMBER TO BRING ALL OF YOUR MEDICATIONS TO EACH OF YOUR FOLLOW-UP OFFICE VISITS. ° °  ° °10 Habits of Highly Healthy People ° °[facility] wants to help you get well and stay well.  Live a longer, healthier life by practicing healthy habits every day. ° °1.  Visit your primary care provider regularly. °2.  Make time for family and friends.  Healthy relationships are important. °3.  Take medications as directed by your provider. °4.  Maintain a healthy weight and a trim waistline. °5.  Eat healthy meals and snacks, rich in fruits, vegetables, whole grains, and lean proteins. °6.  Get moving every day - aim for 150 minutes of moderate physical activity each week. °7.  Don't smoke. °8.  Avoid alcohol or drink in moderation. °9.  Manage stress through meditation or mindful relaxation. °10.  Get seven to nine hours of quality sleep each night. ° °Want more information on healthy habits?  To learn more about these and other healthy habits, visit [email or web address]. °_____________ °  ° ° °

## 2017-06-10 NOTE — Progress Notes (Signed)
Refused bed alarm. Will continue to monitor patient. 

## 2017-06-10 NOTE — Care Management Note (Signed)
Case Management Note  Patient Details  Name: Edward [name] MRN: [id number] Date of Birth: December 12, 1940  Subjective/Objective:                 Patient with order to DC to home today. Chart reviewed. No Home Health or Equipment needs, no unacknowledged Case Management consults or medication needs identified at the time of this note. Plan for DC to home. If needs arise today prior to discharge, please call Carles Collet RN CM at [phone].    Action/Plan:   Expected Discharge Date:  06/10/17               Expected Discharge Plan:  Home/Self Care  In-House Referral:     Discharge planning Services  CM Consult  Post Acute Care Choice:    Choice offered to:     DME Arranged:    DME Agency:     HH Arranged:    HH Agency:     Status of Service:  Completed, signed off  If discussed at [employer] of Stay Meetings, dates discussed:    Additional Comments:  Carles Collet, RN 06/10/2017, 10:31 AM

## 2017-06-10 NOTE — Discharge Summary (Signed)
Discharge Summary    Patient ID: Edward [name],  MRN: [id number], DOB/AGE: 75-23-1942 75 y.o.  Admit date: 06/06/2017 Discharge date: 06/10/2017  Primary Care Provider: Ann [name] Primary Cardiologist: Edward Millers, MD   Discharge Diagnoses    Principal Problem:   Acute on chronic diastolic heart failure ([place])  **Net negative 5.2L this admission.  **Discharge weight 271 lbs.  Active Problems:   Essential hypertension   Severe obesity (BMI >= 40) (HCC)   Diabetes mellitus (HCC)   Chronic kidney disease, stage III (moderate)   Coronary artery disease involving coronary bypass graft of native heart without angina pectoris   Hyperlipidemia LDL goal <70   Essential tremor   Allergies Allergies  Allergen Reactions  . Pneumococcal Vaccines Anaphylaxis    Diagnostic Studies/Procedures    2D Echocardiogram 8.9.2018  Study Conclusions   - Left ventricle: The cavity size was normal. There was severe   concentric hypertrophy. Systolic function was normal. The   estimated ejection fraction was in the range of 60% to 65%. Wall   motion was normal; there were no regional wall motion   abnormalities. Doppler parameters are consistent with abnormal   left ventricular relaxation (grade 1 diastolic dysfunction).   There was no evidence of elevated ventricular filling pressure by   Doppler parameters. - Aortic valve: Trileaflet; normal thickness leaflets. There was no   regurgitation. - Aortic root: The aortic root was normal in size. - Mitral valve: There was no regurgitation. - Left atrium: The atrium was mildly dilated. - Right ventricle: Systolic function was normal. - Tricuspid valve: There was mild regurgitation. - Pulmonary arteries: Systolic pressure was within the normal   range. - Inferior vena cava: The vessel was not visualized. - Pericardium, extracardiac: There was no pericardial effusion. _____________   History of Present Illness     76 year old  male with a history of morbid obesity, obstructive apnea, type 2 diabetes mellitus, coronary artery disease status post two-vessel coronary artery bypass grafting in January 2018, stage III chronic kidney disease, and chronic diastolic congestive heart failure, who was recently seen in clinic secondary to progressive dyspnea, weight gain, and edema. He was felt to be volume overloaded and was admitted for further evaluation.  Hospital Course     Consultants: None  Following admission, Edward [name] was placed on intravenous furosemide with good response. For this admission, he is a net -5.2 L with reduction in weight from 278 pounds on admission to 271 pounds this morning. He was able to be transitioned to oral Lasix by August 9 but complained of dizziness and was noted to have mild elevation in creatinine. He was not having any dyspnea with activity. He remained in the hospital August 10, and this morning is feeling better. He has been ambulating without recurrent dizziness or dyspnea. He will be discharged home today on Lasix 40 mg twice a day, which is double his prior home dose. We will arrange for follow-up within the next week and a basic metabolic panel at that time. _____________  Discharge Vitals Blood pressure (!) 117/57, pulse 64, temperature 97.8 F (36.6 C), temperature source Oral, resp. rate 18, height 5\' 8"  (1.727 m), weight 271 lb 6.4 oz (123.1 kg), SpO2 100 %.  Filed Weights   06/08/17 0617 06/09/17 0205 06/10/17 0521  Weight: 270 lb 4.8 oz (122.6 kg) 272 lb 1.6 oz (123.4 kg) 271 lb 6.4 oz (123.1 kg)    Labs & Radiologic Studies  CBC Lab Results  Component Value Date   WBC 6.9 06/06/2017   HGB 12.6 (L) 06/06/2017   HCT 38.8 (L) 06/06/2017   MCV 85.7 06/06/2017   PLT 271 04/54/0981    Basic Metabolic Panel  Recent Labs  06/09/17 0326 06/10/17 0428  NA 139 139  K 4.3 3.9  CL 102 102  CO2 30 29  GLUCOSE 178* 156*  BUN 14 14  CREATININE 1.23 1.25*  CALCIUM 8.6*  8.7*  _____________  Dg Chest 2 View  Result Date: 06/07/2017 CLINICAL DATA:  76 year old male with a history of shortness of breath and extremity swelling EXAM: CHEST  2 VIEW COMPARISON:  05/22/2017 FINDINGS: Cardiomediastinal silhouette unchanged. Surgical changes of median sternotomy. Evidence of prior coronary stenting. Low lung volumes with basilar atelectasis/scarring. No confluent airspace disease. No pleural effusion. No interlobular septal thickening. No pneumothorax. IMPRESSION: Low lung volumes, without evidence of acute cardiopulmonary disease. Surgical changes of prior median sternotomy. Evidence of prior PTCA Electronically Signed   By: Corrie Mckusick D.O.   On: 06/07/2017 07:41   Dg Chest 2 View  Result Date: 05/22/2017 CLINICAL DATA:  Chest pain and headache. EXAM: CHEST  2 VIEW COMPARISON:  CT chest and two-view chest x-ray 02/05/2017 FINDINGS: The heart is enlarged. The lung volumes are low. Patient is status post median sternotomy for CABG. There is no edema or effusion. No focal airspace disease is present. The visualized soft tissues and bony thorax are unremarkable. IMPRESSION: 1. Cardiomegaly without failure. 2. No acute cardiopulmonary disease. Electronically Signed   By: San Morelle M.D.   On: 05/22/2017 09:29   Ct Head Wo Contrast  Result Date: 05/22/2017 CLINICAL DATA:  Right-sided headaches extending to the vertex. EXAM: CT HEAD WITHOUT CONTRAST TECHNIQUE: Contiguous axial images were obtained from the base of the skull through the vertex without intravenous contrast. COMPARISON:  CT head without contrast 04/21/2016 FINDINGS: Brain: No acute infarct, hemorrhage, or mass lesion is present. The ventricles are of normal size. Mild white matter changes are within normal limits for age. No significant extraaxial fluid collection is present. The brainstem and cerebellum are normal. Vascular: Minimal vascular calcifications are present within the cavernous internal carotid  artery's bilaterally. There is no hyperdense vessel. Skull: The calvarium is intact. No focal lytic or blastic lesions are present. Sinuses/Orbits: The paranasal sinuses and mastoid air cells are clear. The globes and orbits are within normal limits. Bilateral lens replacements are present. The globes and orbits are otherwise within normal limits. IMPRESSION: Normal CT of the brain for age. Electronically Signed   By: San Morelle M.D.   On: 05/22/2017 11:02   Disposition   Pt is being discharged home today in good condition.  Follow-up Plans & Appointments    Follow-up Information    Belva Crome, MD Follow up in 1 week(s).   Specialty:  Cardiology Why:  We will arrange for follow-up and contact you with appointment. Contact information: [address] [place] [address] [phone]        [facility] Follow up on 06/15/2017.   Why:  Please present to the office for a blood chemistry (lab work) to follow-up your kidney function and potassium. Contact information: [place] [address] [phone]           Discharge Medications   Current Discharge Medication List    CONTINUE these medications which have CHANGED   Details  furosemide (LASIX)  40 MG tablet Take 1 tablet (40 mg total) by mouth 2 (two) times daily. Qty: 60 tablet, Refills: 6    insulin NPH-regular Human (NOVOLIN 70/30) (70-30) 100 UNIT/ML injection Inject 60 Units into the skin at bedtime.    potassium chloride SA (K-DUR,KLOR-CON) 20 MEQ tablet Take 1.5 tablets (30 mEq total) by mouth daily. Qty: 45 tablet, Refills: 6   Associated Diagnoses: Hypokalemia      CONTINUE these medications which have NOT CHANGED   Details  acetaminophen (TYLENOL) 500 MG tablet Take 2 tablets (1,000 mg total) by mouth every 6 (six) hours as needed. Qty: 30 tablet, Refills: 0    amLODipine (NORVASC) 5 MG  tablet Take 1 tablet (5 mg total) by mouth daily. Qty: 90 tablet, Refills: 3    aspirin EC 81 MG tablet Take 81 mg by mouth daily.    butalbital-acetaminophen-caffeine (FIORICET, ESGIC) 50-325-40 MG tablet Take 1 tablet by mouth every 6 (six) hours as needed for headache. Qty: 20 tablet, Refills: 0    clonazePAM (KLONOPIN) 0.5 MG tablet Take 1 tablet (0.5 mg total) by mouth 2 (two) times daily as needed (tremors). Qty: 45 tablet, Refills: 3   Associated Diagnoses: Anxiety    clopidogrel (PLAVIX) 75 MG tablet Take 1 tablet (75 mg total) by mouth daily. Qty: 90 tablet, Refills: 3    diclofenac sodium (VOLTAREN) 1 % GEL Apply 2 g topically 4 (four) times daily as needed (for pain).     gabapentin (NEURONTIN) 300 MG capsule Take 1 capsule (300 mg total) by mouth 2 (two) times daily.    insulin aspart (NOVOLOG) 100 UNIT/ML injection Inject 40 Units into the skin 3 (three) times daily before meals.    Polyvinyl Alcohol-Povidone (REFRESH OP) Place 1 drop into both eyes 3 (three) times daily as needed (for dry eyes). Reported on 12/25/2015    propranolol (INDERAL) 40 MG tablet Take 40 mg by mouth 3 (three) times daily. Refills: 0    psyllium (METAMUCIL) 58.6 % packet Take 1 packet by mouth daily as needed (constipation).    VENTOLIN HFA 108 (90 Base) MCG/ACT inhaler inhale 2 puffs by mouth every 6 hours if needed for wheezing or shortness of breath Qty: 18 g, Refills: 1    tamsulosin (FLOMAX) 0.4 MG CAPS capsule Take 0.4 mg by mouth at bedtime. Refills: 0         Outstanding Labs/Studies   BMET in 1 wk  Duration of Discharge Encounter   Greater than 30 minutes including physician time.  Signed, Murray Hodgkins NP 06/10/2017, 9:38 AM

## 2017-06-10 NOTE — Plan of Care (Signed)
Problem: Health Behavior/Discharge Planning: Goal: Ability to manage health-related needs will improve Outcome: Completed/Met Date Met: 06/10/17 Reviewed daily weights, medications with patient

## 2017-06-10 NOTE — Progress Notes (Signed)
Progress Note  Patient Name: Edward [name] Date of Encounter: 06/10/2017  Primary Cardiologist: [place] much better today. Denies SOB or CP.  Inpatient Medications    Scheduled Meds: . amLODipine  5 mg Oral Daily  . aspirin EC  81 mg Oral Daily  . clopidogrel  75 mg Oral Daily  . enoxaparin (LOVENOX) injection  40 mg Subcutaneous Q24H  . furosemide  80 mg Oral BID  . gabapentin  300 mg Oral BID  . insulin aspart  40 Units Subcutaneous TID AC  . insulin aspart protamine- aspart  60 Units Subcutaneous QHS  . oxyCODONE  5 mg Oral Once  . pantoprazole  40 mg Oral Daily  . polyvinyl alcohol  1 drop Both Eyes BID  . potassium chloride  30 mEq Oral BID  . propranolol  40 mg Oral TID  . psyllium  1 packet Oral Daily  . sodium chloride flush  3 mL Intravenous Q12H  . tamsulosin  0.4 mg Oral QHS   Continuous Infusions: . sodium chloride     PRN Meds: sodium chloride, acetaminophen, albuterol, alum & mag hydroxide-simeth, butalbital-acetaminophen-caffeine, clonazePAM, meclizine, ondansetron (ZOFRAN) IV, sodium chloride flush   Vital Signs    Vitals:   06/09/17 1630 06/09/17 1657 06/09/17 1941 06/10/17 0521  BP: (!) 158/64  (!) 128/55 (!) 117/57  Pulse: 66 68 62 64  Resp:   18 18  Temp: 98.6 F (37 C)  97.8 F (36.6 C) 97.8 F (36.6 C)  TempSrc: Oral  Oral Oral  SpO2:   100% 100%  Weight:    271 lb 6.4 oz (123.1 kg)  Height:        Intake/Output Summary (Last 24 hours) at 06/10/17 0800 Last data filed at 06/10/17 0709  Gross per 24 hour  Intake             1420 ml  Output             1815 ml  Net             -395 ml   Filed Weights   06/08/17 0617 06/09/17 0205 06/10/17 0521  Weight: 270 lb 4.8 oz (122.6 kg) 272 lb 1.6 oz (123.4 kg) 271 lb 6.4 oz (123.1 kg)    Telemetry    NSR - Personally Reviewed  ECG    No new tracing - Personally Reviewed  Physical Exam   GEN: No acute distress.   Neck: No JVD Cardiac: RRR, no murmurs,  rubs, or gallops.  Respiratory: Clear to auscultation bilaterally. GI: Soft, nontender, non-distended  MS: 1+ bilateral pedal edema; No deformity. Neuro:  Nonfocal  Psych: Normal affect   Labs    Chemistry Recent Labs Lab 06/06/17 1839  06/08/17 0242 06/09/17 0326 06/10/17 0428  NA 139  < > 140 139 139  K 4.3  < > 3.4* 4.3 3.9  CL 101  < > 100* 102 102  CO2 32  < > 32 30 29  GLUCOSE 359*  < > 100* 178* 156*  BUN 11  < > 13 14 14   CREATININE 1.18  < > 1.16 1.23 1.25*  CALCIUM 9.3  < > 8.8* 8.6* 8.7*  PROT 6.4*  --   --   --   --   ALBUMIN 3.4*  --   --   --   --   AST 23  --   --   --   --   ALT  17  --   --   --   --   ALKPHOS 97  --   --   --   --   BILITOT 0.5  --   --   --   --   GFRNONAA 59*  < > 60* 56* 55*  GFRAA >60  < > >60 >60 >60  ANIONGAP 6  < > 8 7 8   < > = values in this interval not displayed.   Hematology  Recent Labs Lab 06/06/17 1839  WBC 6.9  RBC 4.53  HGB 12.6*  HCT 38.8*  MCV 85.7  MCH 27.8  MCHC 32.5  RDW 13.9  PLT 271    Cardiac Enzymes  Recent Labs Lab 06/06/17 1839 06/06/17 2357 06/07/17 0618  TROPONINI <0.03 <0.03 <0.03   No results for input(s): TROPIPOC in the last 168 hours.   BNP  Recent Labs Lab 06/06/17 1839  BNP 53.5     DDimer No results for input(s): DDIMER in the last 168 hours.   Radiology    No results found.  Cardiac Studies   ECHO 06/08/2107 Left ventricle: The cavity size was normal. There was severe   concentric hypertrophy. Systolic function was normal. The   estimated ejection fraction was in the range of 60% to 65%. Wall   motion was normal; there were no regional wall motion   abnormalities. Doppler parameters are consistent with abnormal   left ventricular relaxation (grade 1 diastolic dysfunction).   There was no evidence of elevated ventricular filling pressure by   Doppler parameters. - Aortic valve: Trileaflet; normal thickness leaflets. There was no   regurgitation. - Aortic root:  The aortic root was normal in size. - Mitral valve: There was no regurgitation. - Left atrium: The atrium was mildly dilated. - Right ventricle: Systolic function was normal. - Tricuspid valve: There was mild regurgitation. - Pulmonary arteries: Systolic pressure was within the normal   range. - Inferior vena cava: The vessel was not visualized. - Pericardium, extracardiac: There was no pericardial effusion.    Patient Profile     76 y.o. male a history of obesity, obstructive sleep apnea, type 2 diabetes with complications, coronary artery disease with two-vessel coronary bypass grafting in January 7124, chronic diastolic heart failure, stage III chronic kidney disease, was admitted to the hospital with a 6 week history of progressive weight gain, edema, and dyspnea. He was admitted from the clinic to the hospital with acute on chronic diastolic heart failure.  Assessment & Plan    1. CHF: improved clinically, no longer with dyspnea and echo suggests normal/low LA pressure. He has residual mild pedal edema. Crea is a little up. Try to keep fluids even. May just need a day to redistribute fluid from peripheral tissues and for [place] to figure out appropriate dose of diuretic at home. Monitor 24 hours, probably DC in AM. Reviewed once more the importance of daily weights and diuretic dose adjustment. We will discharge on lasix 40 mg po BID, and KCl 30 mEq daily, on 40 mg po daily prior to admission, we will arrange for an early follow up on 8/15 or 8/16 to adjust lasix if needed and to repeat electrolytes and Creatinine. 2. HTN: fair control, DBP is rater low which may explain his dizziness. May have to tolerate SBP in low 140s. 3. CAD s/p recent CABG:  No angina. 4. Essential tremor: on propranolol, not same benefit from carvedilol and metoprolol.  Signed, Ena Dawley, MD  06/10/2017,  8:00 AM

## 2017-06-12 ENCOUNTER — Encounter (HOSPITAL_COMMUNITY): Payer: Medicare Other

## 2017-06-15 ENCOUNTER — Other Ambulatory Visit: Payer: Medicare Other | Admitting: *Deleted

## 2017-06-15 DIAGNOSIS — I5030 Unspecified diastolic (congestive) heart failure: Secondary | ICD-10-CM

## 2017-06-15 LAB — BASIC METABOLIC PANEL
BUN/Creatinine Ratio: 14 (ref 10–24)
BUN: 16 mg/dL (ref 8–27)
CO2: 28 mmol/L (ref 20–29)
CREATININE: 1.14 mg/dL (ref 0.76–1.27)
Calcium: 9.1 mg/dL (ref 8.6–10.2)
Chloride: 100 mmol/L (ref 96–106)
GFR calc Af Amer: 72 mL/min/{1.73_m2} (ref >59)
GFR calc non Af Amer: 63 mL/min/{1.73_m2} (ref >59)
GLUCOSE: 216 mg/dL — AB (ref 65–99)
POTASSIUM: 3.9 mmol/L (ref 3.5–5.2)
SODIUM: 141 mmol/L (ref 134–144)

## 2017-06-19 ENCOUNTER — Ambulatory Visit: Payer: Medicare Other | Admitting: Gastroenterology

## 2017-06-19 NOTE — Addendum Note (Signed)
Encounter addended by: Sol Passer on: 06/19/2017  8:45 AM<BR>    Actions taken: Visit Navigator Flowsheet section accepted

## 2017-06-19 NOTE — Progress Notes (Signed)
Cardiology Office Note    Date:  06/22/2017   ID:  Edward [name], DOB September 15, 1941, MRN [id number]  PCP:  Ann Held, DO  Cardiologist: Sinclair Grooms, MD   Chief Complaint  Patient presents with  . Congestive Heart Failure    History of Present Illness:  Edward [name] is a 76 y.o. male who presents for follow-up of CAD with April 2016 drug-eluting stent and February 2017 unstable angina due to high-grade circumflex being treated with medical therapy. CABG x2 for ostial circumflex disease. Recent hospitalization for acute on chronic diastolic heart failure.  Recent hospital stay after developing chronic lower extremity swelling, dyspnea on exertion, and orthopnea. Had acute on chronic diastolic heart failure during the admission and underwent significant diuresis of greater than 10 pounds. Now feels markedly better. His Lasix dose was doubled to 40 mg twice a day as an outpatient. He denies angina.  Past Medical History:  Diagnosis Date  . Adenomatous colon polyp 04/2011  . CAD (coronary artery disease)    a. 01/2015 DES to LAD  b. 12/2015: [place] 85% oLCx lesion--> rx therapy.  . Chronic diastolic CHF (congestive heart failure) ([place])    a. 05/2017 Echo: EF 60-65%, Gr1 DD, no rwma, mildly dil LA, nl RV fxn, mild TR.  . CKD (chronic kidney disease), stage II    a. probable CKD II-III with baseline CR 1.1-1.3.  . DM type 2 (diabetes mellitus, type 2), insulin dependent 01/29/2014   fasting cbg 50-120 with new regimen  . Dyslipidemia, goal LDL below 70 01/29/2014  . Enlarged prostate   . Essential tremor    a. on proprnolol  . GERD (gastroesophageal reflux disease)   . Hypertension   . Internal hemorrhoid   . Sleep apnea    does not use cpap (06/04/2015)  . TIA (transient ischemic attack) 2002    Past Surgical History:  Procedure Laterality Date  . BACK SURGERY    . CARDIAC CATHETERIZATION  01/28/14   + CAD treat medically  . CARDIAC CATHETERIZATION N/A 12/21/2015   Procedure: Right/Left Heart Cath and Coronary Angiography;  Surgeon: Belva Crome, MD;  Location: [facility];  Service: Cardiovascular;  Laterality: N/A;  . CARDIAC CATHETERIZATION N/A 11/02/2016   Procedure: Left Heart Cath and Coronary Angiography;  Surgeon: Nelva Bush, MD;  Location: [facility];  Service: Cardiovascular;  Laterality: N/A;  . CATARACT EXTRACTION Bilateral   . CATARACT EXTRACTION, BILATERAL Bilateral   . CORONARY ANGIOPLASTY WITH STENT PLACEMENT  01/30/2015   DES [place]  [place] to LAD by Dr Tamala Julian  . CORONARY ARTERY BYPASS GRAFT N/A 11/09/2016   Procedure: CORONARY ARTERY BYPASS GRAFTING (CABG) x 2;  Surgeon: Melrose Nakayama, MD;  Location: [facility];  Service: Open Heart Surgery;  Laterality: N/A;  . ENDOVEIN HARVEST OF GREATER SAPHENOUS VEIN Left 11/09/2016   Procedure: ENDOVEIN HARVEST OF GREATER SAPHENOUS VEIN;  Surgeon: Melrose Nakayama, MD;  Location: [facility];  Service: Open Heart Surgery;  Laterality: Left;  . JOINT REPLACEMENT    . LEFT HEART CATHETERIZATION WITH CORONARY ANGIOGRAM N/A 01/28/2014   Procedure: LEFT HEART CATHETERIZATION WITH CORONARY ANGIOGRAM;  Surgeon: Sinclair Grooms, MD;  Location: [facility];  Service: Cardiovascular;  Laterality: N/A;  . LEFT HEART CATHETERIZATION WITH CORONARY ANGIOGRAM N/A 01/30/2015   Procedure: LEFT HEART CATHETERIZATION WITH CORONARY ANGIOGRAM;  Surgeon: Belva Crome, MD;  Location[facility];  Service: Cardiovascular;  Laterality: N/A;  .  LUMBAR DISC SURGERY    . SHOULDER ARTHROSCOPY Right 07/30/2014   Procedure: Right Shoulder Arthroscopy, Debridement, Decompression, Manipulation Under Anesthesia;  Surgeon: Newt Minion, MD;  Location: [facility];  Service: Orthopedics;  Laterality: Right;  . TEE WITHOUT CARDIOVERSION N/A 11/09/2016   Procedure: TRANSESOPHAGEAL ECHOCARDIOGRAM (TEE);  Surgeon: Melrose Nakayama, MD;  Location: [facility];  Service: Open Heart Surgery;  Laterality: N/A;  . TOTAL KNEE  ARTHROPLASTY Bilateral     Current Medications: Outpatient Medications Prior to Visit  Medication Sig Dispense Refill  . acetaminophen (TYLENOL) 500 MG tablet Take 2 tablets (1,000 mg total) by mouth every 6 (six) hours as needed. (Patient taking differently: Take 1,000 mg by mouth every 6 (six) hours as needed for mild pain. ) 30 tablet 0  . amLODipine (NORVASC) 5 MG tablet Take 1 tablet (5 mg total) by mouth daily. 90 tablet 3  . aspirin EC 81 MG tablet Take 81 mg by mouth daily.    . butalbital-acetaminophen-caffeine (FIORICET, ESGIC) 50-325-40 MG tablet Take 1 tablet by mouth every 6 (six) hours as needed for headache. 20 tablet 0  . clonazePAM (KLONOPIN) 0.5 MG tablet Take 1 tablet (0.5 mg total) by mouth 2 (two) times daily as needed (tremors). 45 tablet 3  . clopidogrel (PLAVIX) 75 MG tablet Take 1 tablet (75 mg total) by mouth daily. 90 tablet 3  . diclofenac sodium (VOLTAREN) 1 % GEL Apply 2 g topically 4 (four) times daily as needed (for pain).     . furosemide (LASIX) 40 MG tablet Take 1 tablet (40 mg total) by mouth 2 (two) times daily. 60 tablet 6  . gabapentin (NEURONTIN) 300 MG capsule Take 1 capsule (300 mg total) by mouth 2 (two) times daily.    . insulin aspart (NOVOLOG) 100 UNIT/ML injection Inject 40 Units into the skin 3 (three) times daily before meals.    . insulin NPH-regular Human (NOVOLIN 70/30) (70-30) 100 UNIT/ML injection Inject 60 Units into the skin at bedtime.    . meclizine (ANTIVERT) 25 MG tablet Take 1 tablet (25 mg total) by mouth 2 (two) times daily as needed for dizziness. 30 tablet 1  . Polyvinyl Alcohol-Povidone (REFRESH OP) Place 1 drop into both eyes 3 (three) times daily as needed (for dry eyes). Reported on 12/25/2015    . potassium chloride SA (K-DUR,KLOR-CON) 20 MEQ tablet Take 1.5 tablets (30 mEq total) by mouth daily. 45 tablet 6  . propranolol (INDERAL) 40 MG tablet Take 40 mg by mouth 3 (three) times daily.  0  . psyllium (METAMUCIL) 58.6 % packet  Take 1 packet by mouth daily as needed (constipation).    . tamsulosin (FLOMAX) 0.4 MG CAPS capsule Take 0.4 mg by mouth at bedtime.  0  . VENTOLIN HFA 108 (90 Base) MCG/ACT inhaler inhale 2 puffs by mouth every 6 hours if needed for wheezing or shortness of breath 18 g 1   No facility-administered medications prior to visit.      Allergies:   Pneumococcal vaccines   Social History   Social History  . Marital status: Married    Spouse name: N/A  . Number of children: 0  . Years of education: N/A   Occupational History  . retired    Social History Main Topics  . Smoking status: Never Smoker  . Smokeless tobacco: Never Used  . Alcohol use No  . Drug use: No  . Sexual activity: No   Other Topics Concern  . None  Social History Narrative   He is a Geophysicist/field seismologist by trade.   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   He lives alone.  His wife is living in [place] at this time.  They do not have children.   Highest level of education:  College graduate     Family History:  The patient's family history includes Alzheimer's disease in his sister; Asthma in his brother; CAD in his brother and sister; Diabetes in his other; Prostate cancer in his brother.   ROS:   Please see the history of present illness.    Frequent urination is irritating and a quality of life disruptor. Leg pain, headaches, and occasional dizziness. Leg swelling has improved.  All other systems reviewed and are negative.   PHYSICAL EXAM:   VS:  BP 140/78 (BP Location: Right Arm)   Pulse 74   Ht 5\' 8"  (1.727 m)   Wt 269 lb 9.6 oz (122.3 kg)   BMI 40.99 kg/m    GEN: Well nourished, well developed, in no acute distress  HEENT: normal  Neck: no JVD, carotid bruits, or masses Cardiac: RRR; no murmurs, rubs, or gallops,no edema  Respiratory:  clear to auscultation bilaterally, normal work of breathing GI: soft, nontender, nondistended, + BS MS: no deformity or atrophy  Skin: warm  and dry, no rash Neuro:  Alert and Oriented x 3, Strength and sensation are intact Psych: euthymic mood, full affect  Wt Readings from Last 3 Encounters:  06/22/17 269 lb 9.6 oz (122.3 kg)  06/10/17 271 lb 6.4 oz (123.1 kg)  06/06/17 279 lb 12.8 oz (126.9 kg)      Studies/Labs Reviewed:   EKG:  EKG  Not repeated  Recent Labs: 06/06/2017: ALT 17; B Natriuretic Peptide 53.5; Hemoglobin 12.6; Magnesium 1.9; Platelets 271; TSH 1.362 06/15/2017: BUN 16; Creatinine, Ser 1.14; Potassium 3.9; Sodium 141   Lipid Panel    Component Value Date/Time   CHOL 166 02/06/2017 0147   TRIG 146 02/06/2017 0147   HDL 30 (L) 02/06/2017 0147   CHOLHDL 5.5 02/06/2017 0147   VLDL 29 02/06/2017 0147   LDLCALC 107 (H) 02/06/2017 0147   LDLDIRECT 144.0 07/08/2015 1344    Additional studies/ records that were reviewed today include:  2-D Doppler echocardiogram 06/08/17: Study Conclusions  - Left ventricle: The cavity size was normal. There was severe   concentric hypertrophy. Systolic function was normal. The   estimated ejection fraction was in the range of 60% to 65%. Wall   motion was normal; there were no regional wall motion   abnormalities. Doppler parameters are consistent with abnormal   left ventricular relaxation (grade 1 diastolic dysfunction).   There was no evidence of elevated ventricular filling pressure by   Doppler parameters. - Aortic valve: Trileaflet; normal thickness leaflets. There was no   regurgitation. - Aortic root: The aortic root was normal in size. - Mitral valve: There was no regurgitation. - Left atrium: The atrium was mildly dilated. - Right ventricle: Systolic function was normal. - Tricuspid valve: There was mild regurgitation. - Pulmonary arteries: Systolic pressure was within the normal   range. - Inferior vena cava: The vessel was not visualized. - Pericardium, extracardiac: There was no pericardial effusion.   ASSESSMENT:    1. Acute on chronic diastolic  heart failure ([place])   2. Essential hypertension   3. Coronary artery disease involving coronary bypass graft of native heart without angina pectoris   4. Obstructive sleep apnea  5. Hyperlipidemia LDL goal <70   6. Hypertensive heart disease without heart failure      PLAN:  In order of problems listed above:  1. Volume overload is significantly improved. His weight is down 10 pounds compared to preadmission. He is weighing daily under the same conditions. He will notify us if greater than 3 pound increase overnight or 5 pounds in one week. Basic metabolic panel will be performed today. Diuretic adjustment will be made if necessary. Acute component of heart failure has now resolved. 2. Borderline elevated at 140/78 mmHg. No changes this time. 3. Asymptomatic with reference to angina 4. Wearing continuous positive airway pressure 5. Not addressed 6. As mentioned above blood pressure is borderline target. I prefer 768 mmHg systolic or less. We may make further adjustments after today's laboratory data.  Clinical follow-up in 4 weeks with APAP and with me in 2-4 months.  Medication Adjustments/Labs and Tests Ordered: Current medicines are reviewed at length with the patient today.  Concerns regarding medicines are outlined above.  Medication changes, Labs and Tests ordered today are listed in the Patient Instructions below. Patient Instructions  Medication Instructions:  None  Labwork: BMET today  Your physician recommends that you return for lab work in: 4-6 weeks at time of your appointment with APP. (BMET)   Testing/Procedures: None  Follow-Up: Your physician recommends that you schedule a follow-up appointment in: 4-6 weeks with a PA or NP.    Any Other Special Instructions Will Be Listed Below (If Applicable).     If you need a refill on your cardiac medications before your next appointment, please call your pharmacy.      Signed, Sinclair Grooms, MD    06/22/2017 10:37 AM    [facility] [address], [place], [place]  [address] Phone: [phone]; Fax: [phone]

## 2017-06-21 IMAGING — DX DG CHEST 2V
2 series · 2 of 2 positions shown · non-contrast
Comparison: Chest CT 08/29/2016 and earlier.

CLINICAL DATA: 75-year-old male with chest pain on the left
radiating to the shoulder. Initial encounter.

EXAM:
CHEST  2 VIEW

[w chest pa]
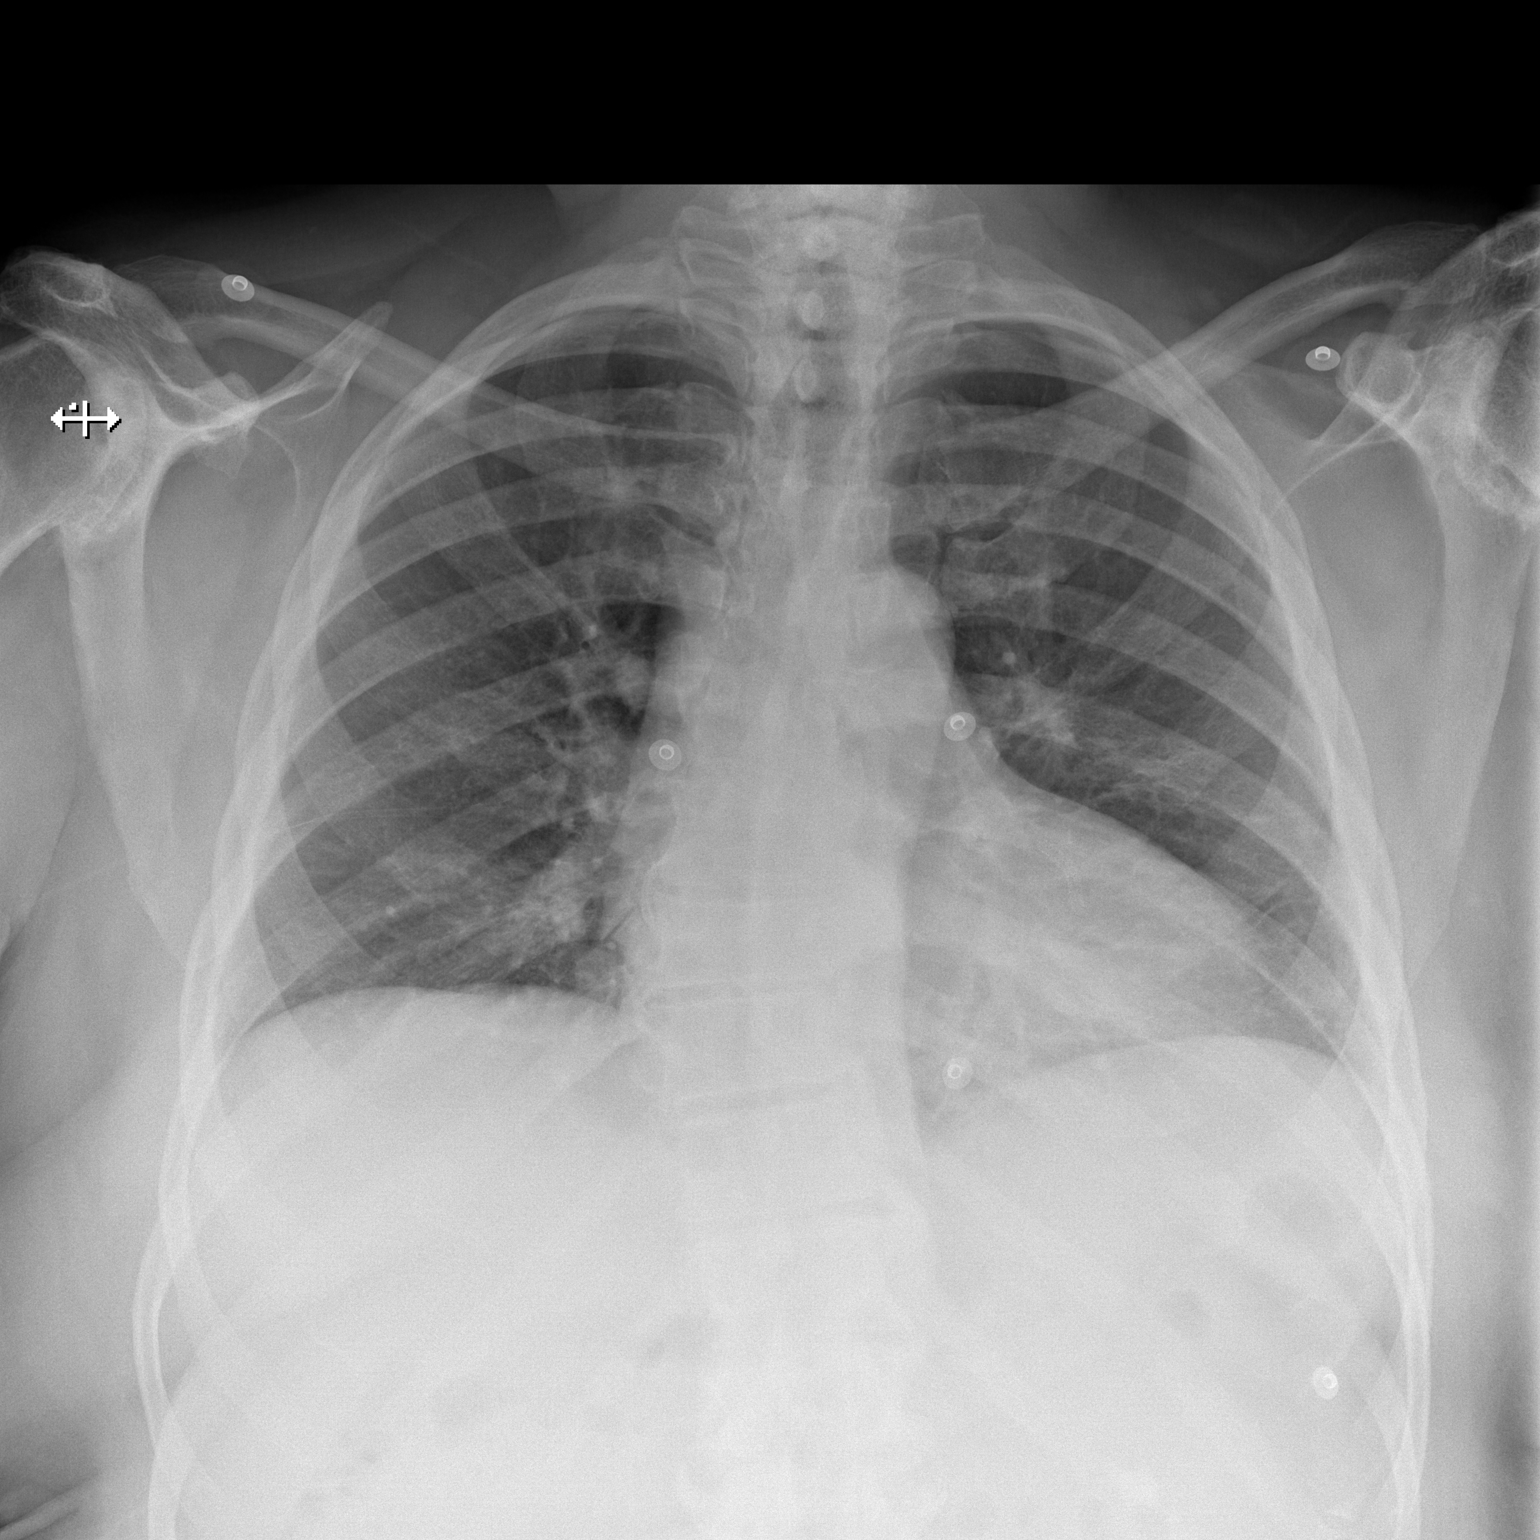

[w chest lat]
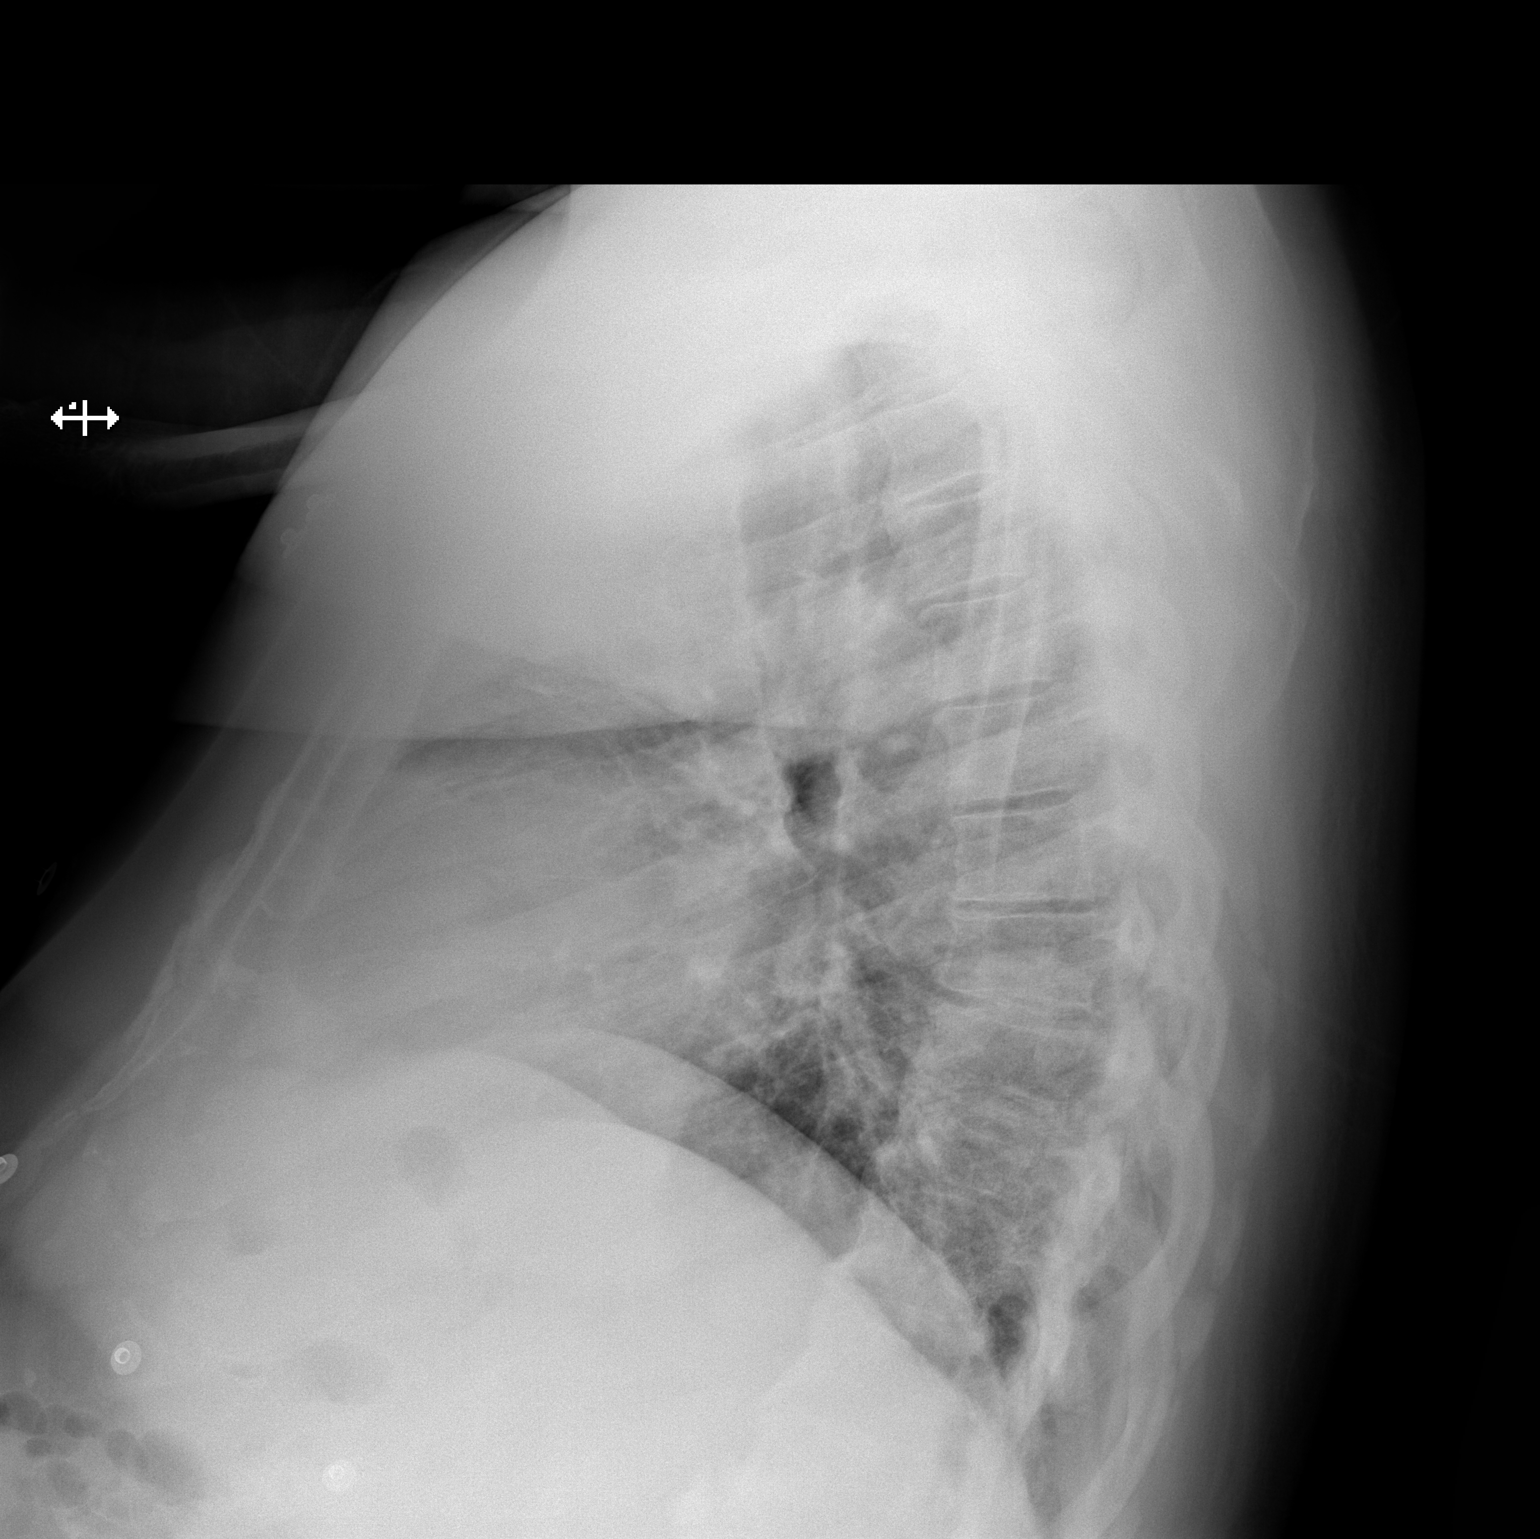

[2 of 2 positions shown; findings below may reference images not displayed]

FINDINGS: Stable lung volumes, low normal. Normal cardiac size and mediastinal
contours. Visualized tracheal air column is within normal limits. No
pneumothorax, pulmonary edema, pleural effusion or confluent
pulmonary opacity. Mild thoracic scoliosis. Advanced degenerative
changes at both shoulders with glenohumeral joint osteophytosis and
subchondral sclerosis. No acute osseous abnormality identified.
IMPRESSION: No acute cardiopulmonary abnormality.

## 2017-06-22 ENCOUNTER — Ambulatory Visit (INDEPENDENT_AMBULATORY_CARE_PROVIDER_SITE_OTHER): Payer: Medicare Other | Admitting: Interventional Cardiology

## 2017-06-22 ENCOUNTER — Encounter: Payer: Self-pay | Admitting: Interventional Cardiology

## 2017-06-22 VITALS — BP 140/78 | HR 74 | Ht 68.0 in | Wt 269.6 lb

## 2017-06-22 DIAGNOSIS — E785 Hyperlipidemia, unspecified: Secondary | ICD-10-CM

## 2017-06-22 DIAGNOSIS — I5033 Acute on chronic diastolic (congestive) heart failure: Secondary | ICD-10-CM | POA: Diagnosis not present

## 2017-06-22 DIAGNOSIS — I25810 Atherosclerosis of coronary artery bypass graft(s) without angina pectoris: Secondary | ICD-10-CM | POA: Diagnosis not present

## 2017-06-22 DIAGNOSIS — I10 Essential (primary) hypertension: Secondary | ICD-10-CM

## 2017-06-22 DIAGNOSIS — G4733 Obstructive sleep apnea (adult) (pediatric): Secondary | ICD-10-CM

## 2017-06-22 DIAGNOSIS — I119 Hypertensive heart disease without heart failure: Secondary | ICD-10-CM | POA: Diagnosis not present

## 2017-06-22 LAB — BASIC METABOLIC PANEL
BUN / CREAT RATIO: 11 (ref 10–24)
BUN: 14 mg/dL (ref 8–27)
CO2: 28 mmol/L (ref 20–29)
CREATININE: 1.27 mg/dL (ref 0.76–1.27)
Calcium: 9 mg/dL (ref 8.6–10.2)
Chloride: 99 mmol/L (ref 96–106)
GFR calc Af Amer: 63 mL/min/{1.73_m2} (ref >59)
GFR, EST NON AFRICAN AMERICAN: 55 mL/min/{1.73_m2} — AB (ref >59)
Glucose: 274 mg/dL — ABNORMAL HIGH (ref 65–99)
Potassium: 4 mmol/L (ref 3.5–5.2)
SODIUM: 141 mmol/L (ref 134–144)

## 2017-06-22 NOTE — Patient Instructions (Signed)
Medication Instructions:  None  Labwork: BMET today  Your physician recommends that you return for lab work in: 4-6 weeks at time of your appointment with APP. (BMET)   Testing/Procedures: None  Follow-Up: Your physician recommends that you schedule a follow-up appointment in: 4-6 weeks with a PA or NP.    Any Other Special Instructions Will Be Listed Below (If Applicable).     If you need a refill on your cardiac medications before your next appointment, please call your pharmacy.

## 2017-06-29 DIAGNOSIS — E1351 Other specified diabetes mellitus with diabetic peripheral angiopathy without gangrene: Secondary | ICD-10-CM | POA: Diagnosis not present

## 2017-06-29 DIAGNOSIS — M79671 Pain in right foot: Secondary | ICD-10-CM | POA: Diagnosis not present

## 2017-06-29 DIAGNOSIS — M79672 Pain in left foot: Secondary | ICD-10-CM | POA: Diagnosis not present

## 2017-06-29 DIAGNOSIS — B351 Tinea unguium: Secondary | ICD-10-CM | POA: Diagnosis not present

## 2017-06-30 ENCOUNTER — Ambulatory Visit: Payer: Medicare Other | Admitting: Internal Medicine

## 2017-07-10 DIAGNOSIS — H02834 Dermatochalasis of left upper eyelid: Secondary | ICD-10-CM | POA: Diagnosis not present

## 2017-07-10 DIAGNOSIS — H02423 Myogenic ptosis of bilateral eyelids: Secondary | ICD-10-CM | POA: Diagnosis not present

## 2017-07-10 DIAGNOSIS — H0279 Other degenerative disorders of eyelid and periocular area: Secondary | ICD-10-CM | POA: Diagnosis not present

## 2017-07-10 DIAGNOSIS — H02413 Mechanical ptosis of bilateral eyelids: Secondary | ICD-10-CM | POA: Diagnosis not present

## 2017-07-10 DIAGNOSIS — L918 Other hypertrophic disorders of the skin: Secondary | ICD-10-CM | POA: Diagnosis not present

## 2017-07-10 DIAGNOSIS — H02831 Dermatochalasis of right upper eyelid: Secondary | ICD-10-CM | POA: Diagnosis not present

## 2017-07-15 ENCOUNTER — Inpatient Hospital Stay (HOSPITAL_COMMUNITY)
Admission: EM | Admit: 2017-07-15 | Discharge: 2017-07-17 | DRG: 313 | Disposition: A | Discharge status: Home / Self Care | Payer: Medicare Other | Attending: Internal Medicine | Admitting: Internal Medicine

## 2017-07-15 ENCOUNTER — Emergency Department (HOSPITAL_COMMUNITY): Payer: Medicare Other

## 2017-07-15 DIAGNOSIS — I5032 Chronic diastolic (congestive) heart failure: Secondary | ICD-10-CM

## 2017-07-15 DIAGNOSIS — E876 Hypokalemia: Secondary | ICD-10-CM | POA: Diagnosis not present

## 2017-07-15 DIAGNOSIS — R059 Cough, unspecified: Secondary | ICD-10-CM

## 2017-07-15 DIAGNOSIS — R0789 Other chest pain: Secondary | ICD-10-CM | POA: Diagnosis not present

## 2017-07-15 DIAGNOSIS — J9811 Atelectasis: Secondary | ICD-10-CM | POA: Diagnosis not present

## 2017-07-15 DIAGNOSIS — I440 Atrioventricular block, first degree: Secondary | ICD-10-CM | POA: Diagnosis present

## 2017-07-15 DIAGNOSIS — Z8673 Personal history of transient ischemic attack (TIA), and cerebral infarction without residual deficits: Secondary | ICD-10-CM

## 2017-07-15 DIAGNOSIS — I25810 Atherosclerosis of coronary artery bypass graft(s) without angina pectoris: Secondary | ICD-10-CM | POA: Diagnosis not present

## 2017-07-15 DIAGNOSIS — E1122 Type 2 diabetes mellitus with diabetic chronic kidney disease: Secondary | ICD-10-CM | POA: Diagnosis present

## 2017-07-15 DIAGNOSIS — R071 Chest pain on breathing: Secondary | ICD-10-CM

## 2017-07-15 DIAGNOSIS — E785 Hyperlipidemia, unspecified: Secondary | ICD-10-CM | POA: Diagnosis present

## 2017-07-15 DIAGNOSIS — Z7982 Long term (current) use of aspirin: Secondary | ICD-10-CM

## 2017-07-15 DIAGNOSIS — K219 Gastro-esophageal reflux disease without esophagitis: Secondary | ICD-10-CM | POA: Diagnosis present

## 2017-07-15 DIAGNOSIS — G250 Essential tremor: Secondary | ICD-10-CM | POA: Diagnosis present

## 2017-07-15 DIAGNOSIS — I2510 Atherosclerotic heart disease of native coronary artery without angina pectoris: Secondary | ICD-10-CM

## 2017-07-15 DIAGNOSIS — J209 Acute bronchitis, unspecified: Secondary | ICD-10-CM | POA: Diagnosis present

## 2017-07-15 DIAGNOSIS — R079 Chest pain, unspecified: Secondary | ICD-10-CM | POA: Diagnosis not present

## 2017-07-15 DIAGNOSIS — I5033 Acute on chronic diastolic (congestive) heart failure: Secondary | ICD-10-CM | POA: Diagnosis present

## 2017-07-15 DIAGNOSIS — R062 Wheezing: Secondary | ICD-10-CM

## 2017-07-15 DIAGNOSIS — Z955 Presence of coronary angioplasty implant and graft: Secondary | ICD-10-CM

## 2017-07-15 DIAGNOSIS — N183 Chronic kidney disease, stage 3 unspecified: Secondary | ICD-10-CM | POA: Diagnosis present

## 2017-07-15 DIAGNOSIS — Z951 Presence of aortocoronary bypass graft: Secondary | ICD-10-CM

## 2017-07-15 DIAGNOSIS — I130 Hypertensive heart and chronic kidney disease with heart failure and stage 1 through stage 4 chronic kidney disease, or unspecified chronic kidney disease: Secondary | ICD-10-CM | POA: Diagnosis present

## 2017-07-15 DIAGNOSIS — M79602 Pain in left arm: Secondary | ICD-10-CM | POA: Diagnosis not present

## 2017-07-15 DIAGNOSIS — R05 Cough: Secondary | ICD-10-CM

## 2017-07-15 DIAGNOSIS — R0602 Shortness of breath: Secondary | ICD-10-CM

## 2017-07-15 DIAGNOSIS — Z833 Family history of diabetes mellitus: Secondary | ICD-10-CM

## 2017-07-15 DIAGNOSIS — Z6841 Body Mass Index (BMI) 40.0 and over, adult: Secondary | ICD-10-CM

## 2017-07-15 DIAGNOSIS — Z96653 Presence of artificial knee joint, bilateral: Secondary | ICD-10-CM | POA: Diagnosis present

## 2017-07-15 DIAGNOSIS — N400 Enlarged prostate without lower urinary tract symptoms: Secondary | ICD-10-CM | POA: Diagnosis present

## 2017-07-15 DIAGNOSIS — I25709 Atherosclerosis of coronary artery bypass graft(s), unspecified, with unspecified angina pectoris: Secondary | ICD-10-CM | POA: Diagnosis present

## 2017-07-15 DIAGNOSIS — Z8249 Family history of ischemic heart disease and other diseases of the circulatory system: Secondary | ICD-10-CM

## 2017-07-15 DIAGNOSIS — Z79899 Other long term (current) drug therapy: Secondary | ICD-10-CM

## 2017-07-15 DIAGNOSIS — I25708 Atherosclerosis of coronary artery bypass graft(s), unspecified, with other forms of angina pectoris: Secondary | ICD-10-CM

## 2017-07-15 DIAGNOSIS — I252 Old myocardial infarction: Secondary | ICD-10-CM | POA: Diagnosis not present

## 2017-07-15 DIAGNOSIS — D696 Thrombocytopenia, unspecified: Secondary | ICD-10-CM | POA: Diagnosis present

## 2017-07-15 DIAGNOSIS — Z794 Long term (current) use of insulin: Secondary | ICD-10-CM | POA: Diagnosis not present

## 2017-07-15 DIAGNOSIS — G4733 Obstructive sleep apnea (adult) (pediatric): Secondary | ICD-10-CM | POA: Diagnosis not present

## 2017-07-15 DIAGNOSIS — M25412 Effusion, left shoulder: Secondary | ICD-10-CM | POA: Diagnosis not present

## 2017-07-15 DIAGNOSIS — Z887 Allergy status to serum and vaccine status: Secondary | ICD-10-CM

## 2017-07-15 DIAGNOSIS — I10 Essential (primary) hypertension: Secondary | ICD-10-CM | POA: Diagnosis present

## 2017-07-15 DIAGNOSIS — E1159 Type 2 diabetes mellitus with other circulatory complications: Secondary | ICD-10-CM | POA: Diagnosis not present

## 2017-07-15 LAB — I-STAT TROPONIN, ED: Troponin i, poc: 0 ng/mL (ref 0.00–0.08)

## 2017-07-15 LAB — TROPONIN I

## 2017-07-15 LAB — BASIC METABOLIC PANEL
ANION GAP: 7 (ref 5–15)
BUN: 11 mg/dL (ref 6–20)
CALCIUM: 8.6 mg/dL — AB (ref 8.9–10.3)
CO2: 29 mmol/L (ref 22–32)
CREATININE: 1.32 mg/dL — AB (ref 0.61–1.24)
Chloride: 102 mmol/L (ref 101–111)
GFR, EST AFRICAN AMERICAN: 59 mL/min — AB (ref >60)
GFR, EST NON AFRICAN AMERICAN: 51 mL/min — AB (ref >60)
Glucose, Bld: 257 mg/dL — ABNORMAL HIGH (ref 65–99)
Potassium: 3 mmol/L — ABNORMAL LOW (ref 3.5–5.1)
SODIUM: 138 mmol/L (ref 135–145)

## 2017-07-15 LAB — CBC
HCT: 36 % — ABNORMAL LOW (ref 39.0–52.0)
HEMOGLOBIN: 11.7 g/dL — AB (ref 13.0–17.0)
MCH: 28.1 pg (ref 26.0–34.0)
MCHC: 32.5 g/dL (ref 30.0–36.0)
MCV: 86.3 fL (ref 78.0–100.0)
PLATELETS: 200 10*3/uL (ref 150–400)
RBC: 4.17 MIL/uL — AB (ref 4.22–5.81)
RDW: 13.5 % (ref 11.5–15.5)
WBC: 6.3 10*3/uL (ref 4.0–10.5)

## 2017-07-15 LAB — CBG MONITORING, ED
GLUCOSE-CAPILLARY: 48 mg/dL — AB (ref 65–99)
GLUCOSE-CAPILLARY: 247 mg/dL — AB (ref 65–99)

## 2017-07-15 LAB — BRAIN NATRIURETIC PEPTIDE: B Natriuretic Peptide: 26.8 pg/mL (ref 0.0–100.0)

## 2017-07-15 LAB — GLUCOSE, CAPILLARY
GLUCOSE-CAPILLARY: 370 mg/dL — AB (ref 65–99)
Glucose-Capillary: 390 mg/dL — ABNORMAL HIGH (ref 65–99)

## 2017-07-15 LAB — MAGNESIUM: Magnesium: 1.6 mg/dL — ABNORMAL LOW (ref 1.7–2.4)

## 2017-07-15 MED ORDER — POTASSIUM CHLORIDE 10 MEQ/100ML IV SOLN
10.0000 meq | Freq: Once | INTRAVENOUS | Status: AC
  Administered 2017-07-15: 10 meq via INTRAVENOUS
  Filled 2017-07-15: qty 100

## 2017-07-15 MED ORDER — ENOXAPARIN SODIUM 30 MG/0.3ML ~~LOC~~ SOLN
30.0000 mg | SUBCUTANEOUS | Status: DC
  Administered 2017-07-15 – 2017-07-16 (×2): 30 mg via SUBCUTANEOUS
  Filled 2017-07-15 (×3): qty 0.3

## 2017-07-15 MED ORDER — ALBUTEROL SULFATE (2.5 MG/3ML) 0.083% IN NEBU
2.5000 mg | INHALATION_SOLUTION | Freq: Four times a day (QID) | RESPIRATORY_TRACT | Status: DC
  Administered 2017-07-15 – 2017-07-16 (×3): 2.5 mg via RESPIRATORY_TRACT
  Filled 2017-07-15 (×3): qty 3

## 2017-07-15 MED ORDER — ASPIRIN EC 81 MG PO TBEC
81.0000 mg | DELAYED_RELEASE_TABLET | Freq: Every day | ORAL | Status: DC
  Administered 2017-07-16 – 2017-07-17 (×2): 81 mg via ORAL
  Filled 2017-07-15 (×2): qty 1

## 2017-07-15 MED ORDER — INSULIN ASPART 100 UNIT/ML ~~LOC~~ SOLN: 40.0000 [IU] | Freq: Three times a day (TID) | SUBCUTANEOUS | Status: DC

## 2017-07-15 MED ORDER — INSULIN ASPART 100 UNIT/ML ~~LOC~~ SOLN
8.0000 [IU] | Freq: Once | SUBCUTANEOUS | Status: AC
Start: 2017-07-15 — End: 2017-07-15
  Administered 2017-07-15: 8 [IU] via SUBCUTANEOUS

## 2017-07-15 MED ORDER — INSULIN ASPART 100 UNIT/ML ~~LOC~~ SOLN
0.0000 [IU] | Freq: Three times a day (TID) | SUBCUTANEOUS | Status: DC
Start: 2017-07-16 — End: 2017-07-17
  Administered 2017-07-16 (×3): 8 [IU] via SUBCUTANEOUS
  Administered 2017-07-17: 11 [IU] via SUBCUTANEOUS
  Administered 2017-07-17: 15 [IU] via SUBCUTANEOUS

## 2017-07-15 MED ORDER — MAGNESIUM SULFATE 2 GM/50ML IV SOLN
2.0000 g | Freq: Once | INTRAVENOUS | Status: AC
Start: 2017-07-15 — End: 2017-07-15
  Administered 2017-07-15: 2 g via INTRAVENOUS
  Filled 2017-07-15: qty 50

## 2017-07-15 MED ORDER — TAMSULOSIN HCL 0.4 MG PO CAPS
0.4000 mg | ORAL_CAPSULE | Freq: Every day | ORAL | Status: DC
  Administered 2017-07-15 – 2017-07-16 (×2): 0.4 mg via ORAL
  Filled 2017-07-15 (×2): qty 1

## 2017-07-15 MED ORDER — AMLODIPINE BESYLATE 5 MG PO TABS
5.0000 mg | ORAL_TABLET | Freq: Every day | ORAL | Status: DC
  Administered 2017-07-16 – 2017-07-17 (×2): 5 mg via ORAL
  Filled 2017-07-15 (×2): qty 1

## 2017-07-15 MED ORDER — FUROSEMIDE 10 MG/ML IJ SOLN
40.0000 mg | Freq: Once | INTRAMUSCULAR | Status: AC
  Administered 2017-07-15: 40 mg via INTRAVENOUS
  Filled 2017-07-15: qty 4

## 2017-07-15 MED ORDER — POTASSIUM CHLORIDE CRYS ER 20 MEQ PO TBCR
40.0000 meq | EXTENDED_RELEASE_TABLET | Freq: Once | ORAL | Status: AC
  Administered 2017-07-15: 40 meq via ORAL
  Filled 2017-07-15: qty 2

## 2017-07-15 MED ORDER — MORPHINE SULFATE (PF) 4 MG/ML IV SOLN
4.0000 mg | Freq: Once | INTRAVENOUS | Status: AC
  Administered 2017-07-15: 4 mg via INTRAVENOUS
  Filled 2017-07-15: qty 1

## 2017-07-15 MED ORDER — NITROGLYCERIN 2 % TD OINT
1.0000 [in_us] | TOPICAL_OINTMENT | Freq: Once | TRANSDERMAL | Status: AC
  Administered 2017-07-15: 1 [in_us] via TOPICAL
  Filled 2017-07-15: qty 1

## 2017-07-15 MED ORDER — INSULIN ASPART PROT & ASPART (70-30 MIX) 100 UNIT/ML ~~LOC~~ SUSP
40.0000 [IU] | Freq: Every day | SUBCUTANEOUS | Status: DC
  Filled 2017-07-15: qty 10

## 2017-07-15 MED ORDER — DOXYCYCLINE HYCLATE 100 MG PO TABS
100.0000 mg | ORAL_TABLET | Freq: Two times a day (BID) | ORAL | Status: DC
  Administered 2017-07-15 – 2017-07-17 (×4): 100 mg via ORAL
  Filled 2017-07-15 (×4): qty 1

## 2017-07-15 MED ORDER — ALBUTEROL SULFATE HFA 108 (90 BASE) MCG/ACT IN AERS: 2.0000 | INHALATION_SPRAY | Freq: Four times a day (QID) | RESPIRATORY_TRACT | Status: DC

## 2017-07-15 MED ORDER — ACETAMINOPHEN 500 MG PO TABS
1000.0000 mg | ORAL_TABLET | Freq: Four times a day (QID) | ORAL | Status: DC | PRN
  Administered 2017-07-15 – 2017-07-17 (×3): 1000 mg via ORAL
  Filled 2017-07-15 (×3): qty 2

## 2017-07-15 MED ORDER — ALBUTEROL SULFATE (2.5 MG/3ML) 0.083% IN NEBU: 2.5000 mg | INHALATION_SOLUTION | RESPIRATORY_TRACT | Status: DC | PRN

## 2017-07-15 MED ORDER — CLOPIDOGREL BISULFATE 75 MG PO TABS
75.0000 mg | ORAL_TABLET | Freq: Every day | ORAL | Status: DC
  Administered 2017-07-16 – 2017-07-17 (×2): 75 mg via ORAL
  Filled 2017-07-15 (×2): qty 1

## 2017-07-15 MED ORDER — SACCHAROMYCES BOULARDII 250 MG PO CAPS
250.0000 mg | ORAL_CAPSULE | Freq: Two times a day (BID) | ORAL | Status: DC
  Administered 2017-07-15 – 2017-07-17 (×4): 250 mg via ORAL
  Filled 2017-07-15 (×5): qty 1

## 2017-07-15 MED ORDER — POTASSIUM CHLORIDE CRYS ER 20 MEQ PO TBCR
30.0000 meq | EXTENDED_RELEASE_TABLET | Freq: Every day | ORAL | Status: DC
  Administered 2017-07-16 – 2017-07-17 (×2): 30 meq via ORAL
  Filled 2017-07-15 (×2): qty 1

## 2017-07-15 MED ORDER — MECLIZINE HCL 25 MG PO TABS: 25.0000 mg | ORAL_TABLET | Freq: Two times a day (BID) | ORAL | Status: DC | PRN

## 2017-07-15 MED ORDER — PROPRANOLOL HCL 40 MG PO TABS
40.0000 mg | ORAL_TABLET | Freq: Three times a day (TID) | ORAL | Status: DC
  Administered 2017-07-15 – 2017-07-17 (×6): 40 mg via ORAL
  Filled 2017-07-15 (×6): qty 1

## 2017-07-15 MED ORDER — FUROSEMIDE 20 MG PO TABS
40.0000 mg | ORAL_TABLET | Freq: Two times a day (BID) | ORAL | Status: DC
  Administered 2017-07-15 – 2017-07-17 (×4): 40 mg via ORAL
  Filled 2017-07-15 (×4): qty 2

## 2017-07-15 MED ORDER — INSULIN ASPART PROT & ASPART (70-30 MIX) 100 UNIT/ML ~~LOC~~ SUSP
30.0000 [IU] | Freq: Every day | SUBCUTANEOUS | Status: DC
  Administered 2017-07-15: 30 [IU] via SUBCUTANEOUS
  Filled 2017-07-15: qty 10

## 2017-07-15 MED ORDER — GABAPENTIN 300 MG PO CAPS
300.0000 mg | ORAL_CAPSULE | Freq: Two times a day (BID) | ORAL | Status: DC
  Administered 2017-07-15 – 2017-07-17 (×4): 300 mg via ORAL
  Filled 2017-07-15 (×4): qty 1

## 2017-07-15 NOTE — ED Notes (Signed)
Admitting Provider at bedside. 

## 2017-07-15 NOTE — ED Notes (Signed)
Patient transported to X-ray 

## 2017-07-15 NOTE — H&P (Signed)
History and Physical    Edward [name] WPY:[id number] DOB: 1941/09/18 DOA: 07/15/2017  PCP: Ann Held, DO Patient coming from:   I have personally briefly reviewed patient's old medical records in [place]  Chief Complaint: Chest pain and shortness of breath  HPI: Edward [name] is a 76 y.o. male with medical history significant of CAD CABG in January 2018, hypertension, chronic diastolic heart failure presented with complaints of left-sided chest pain radiating his left palm that started 2 days prior to admission. He states the pain got better after morphine. He got aspirin and nitroglycerin which did not decrease his pain. However he does not feel his pain is similar to the pain he had many had a heart attack. He does complain of having fever and chills at home. He does complain of a dry cough. His chest pain gets worse when he coughs. He feels these symptoms are similar to the symptoms he had pneumonia last time. He also reports that he's been wheezing since Tuesday. He has not been around sick contacts. He denies any nausea vomiting diarrhea. He denies any headache changes with this patient. He denies any urinary complaints. Patient reports that he's gained 1 pound and his dry weight is 270 pounds. Patient lives alone at home. He has a brother who lives 3 blocks every from him.  ED Course: In the ER he was seen by cardiology at this admission by private hospitalists. And to rule him out for MI. His potassium was 3.0 magnesium is 1.6 which was replaced in the ER. Creatinine 1.32. Chest x-ray showed mild opacity in the left base greater than right atelectasis versus infiltrates. EKG showed no acute ST-T wave changes.  Review of Systems: As per HPI otherwise 10 point review of systems negative.   Past Medical History:  Diagnosis Date  . Adenomatous colon polyp 04/2011  . CAD (coronary artery disease)    a. 01/2015 DES to LAD  b. 12/2015: [place] 85% oLCx lesion--> rx therapy.  .  Chronic diastolic CHF (congestive heart failure) ([place])    a. 05/2017 Echo: EF 60-65%, Gr1 DD, no rwma, mildly dil LA, nl RV fxn, mild TR.  . CKD (chronic kidney disease), stage II    a. probable CKD II-III with baseline CR 1.1-1.3.  . DM type 2 (diabetes mellitus, type 2), insulin dependent 01/29/2014   fasting cbg 50-120 with new regimen  . Dyslipidemia, goal LDL below 70 01/29/2014  . Enlarged prostate   . Essential tremor    a. on proprnolol  . GERD (gastroesophageal reflux disease)   . Hypertension   . Internal hemorrhoid   . Sleep apnea    does not use cpap (06/04/2015)  . TIA (transient ischemic attack) 2002    Past Surgical History:  Procedure Laterality Date  . BACK SURGERY    . CARDIAC CATHETERIZATION  01/28/14   + CAD treat medically  . CARDIAC CATHETERIZATION N/A 12/21/2015   Procedure: Right/Left Heart Cath and Coronary Angiography;  Surgeon: Belva Crome, MD;  Location: [place] [facility];  Service: Cardiovascular;  Laterality: N/A;  . CARDIAC CATHETERIZATION N/A 11/02/2016   Procedure: Left Heart Cath and Coronary Angiography;  Surgeon: Nelva Bush, MD;  Location: [place] [facility];  Service: Cardiovascular;  Laterality: N/A;  . CATARACT EXTRACTION Bilateral   . CATARACT EXTRACTION, BILATERAL Bilateral   . CORONARY ANGIOPLASTY WITH STENT PLACEMENT  01/30/2015   DES [place]  [place] to LAD by Dr Tamala Julian  .  CORONARY ARTERY BYPASS GRAFT N/A 11/09/2016   Procedure: CORONARY ARTERY BYPASS GRAFTING (CABG) x 2;  Surgeon: Melrose Nakayama, MD;  Location: [place];  Service: Open Heart Surgery;  Laterality: N/A;  . ENDOVEIN HARVEST OF GREATER SAPHENOUS VEIN Left 11/09/2016   Procedure: ENDOVEIN HARVEST OF GREATER SAPHENOUS VEIN;  Surgeon: Melrose Nakayama, MD;  Location: [place];  Service: Open Heart Surgery;  Laterality: Left;  . JOINT REPLACEMENT    . LEFT HEART CATHETERIZATION WITH CORONARY ANGIOGRAM N/A 01/28/2014   Procedure: LEFT HEART CATHETERIZATION WITH CORONARY ANGIOGRAM;   Surgeon: Sinclair Grooms, MD;  Location: [facility] CATH LAB;  Service: Cardiovascular;  Laterality: N/A;  . LEFT HEART CATHETERIZATION WITH CORONARY ANGIOGRAM N/A 01/30/2015   Procedure: LEFT HEART CATHETERIZATION WITH CORONARY ANGIOGRAM;  Surgeon: Belva Crome, MD;  Location[facility];  Service: Cardiovascular;  Laterality: N/A;  . LUMBAR [place]    . SHOULDER ARTHROSCOPY Right 07/30/2014   Procedure: Right Shoulder Arthroscopy, Debridement, Decompression, Manipulation Under Anesthesia;  Surgeon: Newt Minion, MD;  Location: [place];  Service: Orthopedics;  Laterality: Right;  . TEE WITHOUT CARDIOVERSION N/A 11/09/2016   Procedure: TRANSESOPHAGEAL ECHOCARDIOGRAM (TEE);  Surgeon: Melrose Nakayama, MD;  Location: [place];  Service: Open Heart Surgery;  Laterality: N/A;  . TOTAL KNEE ARTHROPLASTY Bilateral      reports that he has never smoked. He has never used smokeless tobacco. He reports that he does not drink alcohol or use drugs.  Allergies  Allergen Reactions  . Pneumococcal Vaccines Anaphylaxis    Family History  Problem Relation Age of Onset  . CAD Brother        2 brothers - CABG  . Alzheimer's disease Sister   . CAD Sister   . Prostate cancer Brother   . Asthma Brother        2 brothers   . Diabetes Other        entire family  . Colon cancer Neg Hx    Unacceptable: Noncontributory, unremarkable, or negative. Acceptable: Family history reviewed and not pertinent (If you reviewed it)  Prior to Admission medications   Medication Sig Start Date End Date Taking? Authorizing Provider  acetaminophen (TYLENOL) 500 MG tablet Take 2 tablets (1,000 mg total) by mouth every 6 (six) hours as needed. Patient taking differently: Take 1,000 mg by mouth every 6 (six) hours as needed for mild pain.  12/07/16  Yes Leo Grosser, MD  amLODipine (NORVASC) 5 MG tablet Take 1 tablet (5 mg total) by mouth daily. 05/05/17 08/03/17 Yes Belva Crome, MD  aspirin EC 81 MG tablet Take 81 mg by mouth  daily.   Yes [provider]  butalbital-acetaminophen-caffeine (FIORICET, ESGIC) 250-261-2395 MG tablet Take 1 tablet by mouth every 6 (six) hours as needed for headache. 05/22/17 05/22/18 Yes Carlisle Cater, PA-C  clonazePAM (KLONOPIN) 0.5 MG tablet Take 1 tablet (0.5 mg total) by mouth 2 (two) times daily as needed (tremors). 09/28/16  Yes Patel, Donika K, DO  clopidogrel (PLAVIX) 75 MG tablet Take 1 tablet (75 mg total) by mouth daily. 12/23/16  Yes Belva Crome, MD  diclofenac sodium (VOLTAREN) 1 % GEL Apply 2 g topically 4 (four) times daily as needed (for pain).    Yes [provider]  furosemide (LASIX) 40 MG tablet Take 1 tablet (40 mg total) by mouth 2 (two) times daily. 06/10/17  Yes Rogelia Mire, NP  gabapentin (NEURONTIN) 300 MG capsule Take 1 capsule (300 mg  total) by mouth 2 (two) times daily. 02/06/17  Yes Vann, Jessica U, DO  insulin aspart (NOVOLOG) 100 UNIT/ML injection Inject 40 Units into the skin 3 (three) times daily before meals.   Yes [provider]  insulin NPH-regular Human (NOVOLIN 70/30) (70-30) 100 UNIT/ML injection Inject 60 Units into the skin at bedtime. 06/10/17  Yes Rogelia Mire, NP  meclizine (ANTIVERT) 25 MG tablet Take 1 tablet (25 mg total) by mouth 2 (two) times daily as needed for dizziness. 06/10/17  Yes Rogelia Mire, NP  Polyvinyl Alcohol-Povidone (REFRESH OP) Place 1 drop into both eyes 3 (three) times daily as needed (for dry eyes). Reported on 12/25/2015   Yes [provider]  potassium chloride SA (K-DUR,KLOR-CON) 20 MEQ tablet Take 1.5 tablets (30 mEq total) by mouth daily. 06/10/17  Yes Rogelia Mire, NP  propranolol (INDERAL) 40 MG tablet Take 40 mg by mouth 3 (three) times daily. 10/25/16  Yes [provider]  psyllium (METAMUCIL) 58.6 % packet Take 1 packet by mouth daily as needed (constipation).   Yes [provider]  tamsulosin (FLOMAX) 0.4 MG CAPS capsule Take 0.4 mg by  mouth at bedtime. 05/30/17  Yes [provider]  VENTOLIN HFA 108 (90 Base) MCG/ACT inhaler inhale 2 puffs by mouth every 6 hours if needed for wheezing or shortness of breath 05/04/17  Yes Ann Held, [place]    Physical Exam: Vitals:   07/15/17 1545 07/15/17 1600 07/15/17 1615 07/15/17 1630  BP:  131/72  (!) 135/110  Pulse: 76 72 69 63  Resp:    (!) 9  Temp:      TempSrc:      SpO2: 100% 100% 100% 100%  Weight:      Height:        Constitutional: NAD, calm, comfortable Vitals:   07/15/17 1545 07/15/17 1600 07/15/17 1615 07/15/17 1630  BP:  131/72  (!) 135/110  Pulse: 76 72 69 63  Resp:    (!) 9  Temp:      TempSrc:      SpO2: 100% 100% 100% 100%  Weight:      Height:       Eyes: PERRL, lids and conjunctivae normal ENMT: Mucous membranes are moist. Posterior pharynx clear of any exudate or lesions.Normal dentition.  Neck: normal, supple, no masses, no thyromegaly Respiratory: wheezing bilaterally, , no crackles. Normal respiratory effort. No accessory muscle use.  Cardiovascular: Regular rate and rhythm, no murmurs / rubs / gallops. No extremity edema. 2+ pedal pulses. No carotid bruits.  Abdomen: no tenderness, no masses palpated. No hepatosplenomegaly. Bowel sounds positive.  Musculoskeletal: no clubbing / cyanosis. No joint deformity upper and lower extremities. Good ROM, no contractures. Normal muscle tone.  Skin: no rashes, lesions, ulcers. No induration Neurologic: CN 2-12 grossly intact. Sensation intact, DTR normal. Strength 5/5 in all 4.  Psychiatric: Normal judgment and insight. Alert and oriented x 3. Normal mood.   (Anything < 9 systems with 2 bullets each down codes to level 1) (If patient refuses exam can't bill higher level) (Make sure to document decubitus ulcers present on admission -- if possible -- and whether patient has chronic indwelling catheter at time of admission)  Labs on Admission: I have personally reviewed following labs and  imaging studies  CBC:  Recent Labs Lab 07/15/17 1106  WBC 6.3  HGB 11.7*  HCT 36.0*  MCV 86.3  PLT 147   Basic Metabolic Panel:  Recent Labs Lab 07/15/17  1106  NA 138  K 3.0*  CL 102  CO2 29  GLUCOSE 257*  BUN 11  CREATININE 1.32*  CALCIUM 8.6*  MG 1.6*   GFR: Estimated Creatinine Clearance: 61.7 mL/min (A) (by C-G formula based on SCr of 1.32 mg/dL (H)). Liver Function Tests: No results for input(s): AST, ALT, ALKPHOS, BILITOT, PROT, ALBUMIN in the last 168 hours. No results for input(s): LIPASE, AMYLASE in the last 168 hours. No results for input(s): AMMONIA in the last 168 hours. Coagulation Profile: No results for input(s): INR, PROTIME in the last 168 hours. Cardiac Enzymes: No results for input(s): CKTOTAL, CKMB, CKMBINDEX, TROPONINI in the last 168 hours. BNP (last 3 results) No results for input(s): PROBNP in the last 8760 hours. HbA1C: No results for input(s): HGBA1C in the last 72 hours. CBG:  Recent Labs Lab 07/15/17 1602  GLUCAP 48*   Lipid Profile: No results for input(s): CHOL, HDL, LDLCALC, TRIG, CHOLHDL, LDLDIRECT in the last 72 hours. Thyroid Function Tests: No results for input(s): TSH, T4TOTAL, FREET4, T3FREE, THYROIDAB in the last 72 hours. Anemia Panel: No results for input(s): VITAMINB12, FOLATE, FERRITIN, TIBC, IRON, RETICCTPCT in the last 72 hours. Urine analysis:    Component Value Date/Time   COLORURINE YELLOW 11/08/2016 [place] 11/08/2016 0955   LABSPEC 1.013 11/08/2016 0955   PHURINE 6.0 11/08/2016 0955   GLUCOSEU 50 (A) 11/08/2016 0955   HGBUR NEGATIVE 11/08/2016 0955   BILIRUBINUR neg 01/02/2017 0959   KETONESUR NEGATIVE 11/08/2016 0955   PROTEINUR small 01/02/2017 0959   PROTEINUR NEGATIVE 11/08/2016 0955   UROBILINOGEN negative 01/02/2017 0959   NITRITE neg 01/02/2017 0959   NITRITE NEGATIVE 11/08/2016 0955   LEUKOCYTESUR Negative 01/02/2017 0959    Radiological Exams on Admission: Dg Chest 2  View  Result Date: 07/15/2017 CLINICAL DATA:  Chest pain. EXAM: CHEST  2 VIEW COMPARISON:  June 07, 2017 FINDINGS: Cardiomegaly. The hila and mediastinum are normal. Mild opacity in the bases may have an atelectatic component. However, developing infiltrate, particularly in the left base is not completely excluded. Recommend clinical correlation and follow-up as clinically warranted. No edema. IMPRESSION: Mild opacity in the bases, left greater than right could represent atelectasis or developing infiltrate. Recommend follow-up to resolution. Electronically Signed   By: Dorise Bullion III M.D   On: 07/15/2017 13:09   Dg Shoulder Left  Result Date: 07/15/2017 CLINICAL DATA:  Pain. EXAM: LEFT SHOULDER - 2+ VIEW COMPARISON:  February 06, 2017 FINDINGS: Extensive remodeling of the humeral head with loss of joint space and osteophytosis, unchanged since April 2018, consistent with a chronic osteoarthritic process. No fracture or dislocation. No other acute abnormalities. IMPRESSION: Severe degenerative changes.  No fracture or dislocation. Electronically Signed   By: Dorise Bullion III M.D   On: 07/15/2017 13:07    EKG: Independently reviewed.  Assessment/Plan Active Problems:   Chest pain  Atypical chest pain-patient with symptoms of atypical pneumonia with fever chills dry cough and chest x-ray consistent with a possible infiltrates versus atelectasis and bilateral wheezing. I treated with doxycycline 100 mg twice a day, albuterol SVN treatments. With his cardiac history obtain troponin every 62. Cardiology following.  Hypokalemia potassium was repleted recheck levels in the morning.  Hypomagnesemia repleted in the ER for low platelets in the morning.  Type 2 diabetes restart home dose of insulin 7030 and NovoLog. Check blood sugar.  Chronic diastolic CHF with normal ejection fraction stable at this time. Continue Lasix twice a day 40 mg.  Daily weights.  Hypertension continue Norvasc and  Inderal.  BPH continue Flomax.  DVT prophylaxis:  Code Status:  Family Communication: Disposition Plan:  Consults called:  Admission status:    Georgette Shell MD [facility]  If 7PM-7AM, please contact night-coverage [email or web address] Password [facility]  07/15/2017, 5:19 PM

## 2017-07-15 NOTE — Progress Notes (Signed)
New pt admission from ED. Pt brought to the floor in stable condition. Vitals taken. Initial Assessment done. All immediate pertinent needs to patient addressed. Patient Guide given to patient. Important safety instructions relating to hospitalization reviewed with patient. Patient verbalized understanding. Will continue to monitor pt. 

## 2017-07-15 NOTE — ED Provider Notes (Signed)
[facility] DEPT Provider Note   CSN: [id number] Arrival date & time: 07/15/17  1056     History   Chief Complaint Chief Complaint  Patient presents with  . Chest Pain    HPI Edward [name] is a 76 y.o. male with a history of HTN, TIA, HLD, GERD, Insulin dependent DM2, OSA, Diastolic CHF, CKD3, obesity, CAD s/p two-vessel coronary artery bypass grafting in 2018 for ostial circumflex disease and stent placement in 2016 who presents today for chest pain that began Tuesday. The patient states that Tuesday afternoon while the patient was at rest he had gradual onset of left chest tightness with radiation to left shoulder that radiates down the arm as well as left and right neck. He says this feels similar to when he had a MI in the past but at that time it was "elephant sitting on my chest and now it is just a lion". No radiation to the jaw, right shoulder or back. Patient states the pain is not positional, exertional and not worsened with deep breathing. The pain has gradually increased to now a 10/10. This morning when he awoke he states that his sheets were soaked and he was covered in sweat. This prompted him to call EMS. On route the patient had normal EKG. He was given 324 ASA and 3 SL NTG without relief. EMS reports shoulder pain reproducible on exam. Patient recently admitted on 06/06/17 for acute on chronic diastolic HF. He had net negative 5.2 L during the admission and d/c weight was 271. He states that he weighed himself this morning at 271 but thought his baseline was 269. Patient admits to mild increase in swelling of the b/l lower legs over the last week. He admits to non-productive cough and sob upon awakening that is relieved after he takes his morning inhaler. Patient last echo on 06/08/17 showed EF 60-65%, with normal wall motion and G1DD. Patient cardiologist is Dr. Daneen Schick.   HPI  Past Medical History:  Diagnosis Date  . Adenomatous colon polyp 04/2011  . CAD (coronary  artery disease)    a. 01/2015 DES to LAD  b. 12/2015: [place] 85% oLCx lesion--> rx therapy.  . Chronic diastolic CHF (congestive heart failure) ([place])    a. 05/2017 Echo: EF 60-65%, Gr1 DD, no rwma, mildly dil LA, nl RV fxn, mild TR.  . CKD (chronic kidney disease), stage II    a. probable CKD II-III with baseline CR 1.1-1.3.  . DM type 2 (diabetes mellitus, type 2), insulin dependent 01/29/2014   fasting cbg 50-120 with new regimen  . Dyslipidemia, goal LDL below 70 01/29/2014  . Enlarged prostate   . Essential tremor    a. on proprnolol  . GERD (gastroesophageal reflux disease)   . Hypertension   . Internal hemorrhoid   . Sleep apnea    does not use cpap (06/04/2015)  . TIA (transient ischemic attack) 2002    Patient Active Problem List   Diagnosis Date Noted  . Chest pain 07/15/2017  . Coronary artery disease involving coronary bypass graft of native heart without angina pectoris   . 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 Hypertensive heart disease without heart failure   . Chronic kidney disease, stage III (moderate) 11/03/2016  . Acute on chronic diastolic heart failure ([place])   . Flank pain, acute 07/06/2016  . Near syncope 04/19/2016  . Diabetes mellitus ([place]) 03/11/2016  . GERD (gastroesophageal reflux disease) 08/27/2015  . Acute bronchitis 08/27/2015  . Obstructive  sleep apnea 06/22/2015  . Essential tremor 04/23/2015  . Rotator cuff arthropathy 07/30/2014  . CAP (community acquired pneumonia) 04/16/2014  . Severe obesity (BMI >= 40) ([place]) 02/11/2014  . Hyperlipidemia LDL goal <70 01/29/2014  . Essential hypertension 01/29/2014    Past Surgical History:  Procedure Laterality Date  . BACK SURGERY    . CARDIAC CATHETERIZATION  01/28/14   + CAD treat medically  . CARDIAC CATHETERIZATION N/A 12/21/2015   Procedure: Right/Left Heart Cath and Coronary Angiography;  Surgeon: Belva Crome, MD;  Location: [facility];  Service: Cardiovascular;  Laterality: N/A;  . CARDIAC  CATHETERIZATION N/A 11/02/2016   Procedure: Left Heart Cath and Coronary Angiography;  Surgeon: Nelva Bush, MD;  Location: [facility];  Service: Cardiovascular;  Laterality: N/A;  . CATARACT EXTRACTION Bilateral   . CATARACT EXTRACTION, BILATERAL Bilateral   . CORONARY ANGIOPLASTY WITH STENT PLACEMENT  01/30/2015   DES [place]  [place] to LAD by Dr Tamala Julian  . CORONARY ARTERY BYPASS GRAFT N/A 11/09/2016   Procedure: CORONARY ARTERY BYPASS GRAFTING (CABG) x 2;  Surgeon: Melrose Nakayama, MD;  Location: [facility];  Service: Open Heart Surgery;  Laterality: N/A;  . ENDOVEIN HARVEST OF GREATER SAPHENOUS VEIN Left 11/09/2016   Procedure: ENDOVEIN HARVEST OF GREATER SAPHENOUS VEIN;  Surgeon: Melrose Nakayama, MD;  Location: [facility];  Service: Open Heart Surgery;  Laterality: Left;  . JOINT REPLACEMENT    . LEFT HEART CATHETERIZATION WITH CORONARY ANGIOGRAM N/A 01/28/2014   Procedure: LEFT HEART CATHETERIZATION WITH CORONARY ANGIOGRAM;  Surgeon: Sinclair Grooms, MD;  Location: [facility] CATH LAB;  Service: Cardiovascular;  Laterality: N/A;  . LEFT HEART CATHETERIZATION WITH CORONARY ANGIOGRAM N/A 01/30/2015   Procedure: LEFT HEART CATHETERIZATION WITH CORONARY ANGIOGRAM;  Surgeon: Belva Crome, MD;  Location: [facility] CATH LAB;  Service: Cardiovascular;  Laterality: N/A;  . LUMBAR [place]    . SHOULDER ARTHROSCOPY Right 07/30/2014   Procedure: Right Shoulder Arthroscopy, Debridement, Decompression, Manipulation Under Anesthesia;  Surgeon: Newt Minion, MD;  Location: [facility];  Service: Orthopedics;  Laterality: Right;  . TEE WITHOUT CARDIOVERSION N/A 11/09/2016   Procedure: TRANSESOPHAGEAL ECHOCARDIOGRAM (TEE);  Surgeon: Melrose Nakayama, MD;  Location: [facility];  Service: Open Heart Surgery;  Laterality: N/A;  . TOTAL KNEE ARTHROPLASTY Bilateral        Home Medications    Prior to Admission medications   Medication Sig Start Date End Date Taking? Authorizing Provider  acetaminophen (TYLENOL) 500  MG tablet Take 2 tablets (1,000 mg total) by mouth every 6 (six) hours as needed. Patient taking differently: Take 1,000 mg by mouth every 6 (six) hours as needed for mild pain.  12/07/16  Yes Leo Grosser, MD  amLODipine (NORVASC) 5 MG tablet Take 1 tablet (5 mg total) by mouth daily. 05/05/17 08/03/17 Yes Belva Crome, MD  aspirin EC 81 MG tablet Take 81 mg by mouth daily.   Yes [provider]  butalbital-acetaminophen-caffeine (FIORICET, ESGIC) [phone] MG tablet Take 1 tablet by mouth every 6 (six) hours as needed for headache. 05/22/17 05/22/18 Yes Carlisle Cater, PA-C  clonazePAM (KLONOPIN) 0.5 MG tablet Take 1 tablet (0.5 mg total) by mouth 2 (two) times daily as needed (tremors). 09/28/16  Yes Patel, Donika K, DO  clopidogrel (PLAVIX) 75 MG tablet Take 1 tablet (75 mg total) by mouth daily. 12/23/16  Yes Belva Crome, MD  diclofenac sodium (VOLTAREN) 1 % GEL Apply 2 g topically 4 (four) times  daily as needed (for pain).    Yes [provider]  furosemide (LASIX) 40 MG tablet Take 1 tablet (40 mg total) by mouth 2 (two) times daily. 06/10/17  Yes Rogelia Mire, NP  gabapentin (NEURONTIN) 300 MG capsule Take 1 capsule (300 mg total) by mouth 2 (two) times daily. 02/06/17  Yes Vann, Jessica U, DO  insulin aspart (NOVOLOG) 100 UNIT/ML injection Inject 40 Units into the skin 3 (three) times daily before meals.   Yes [provider]  insulin NPH-regular Human (NOVOLIN 70/30) (70-30) 100 UNIT/ML injection Inject 60 Units into the skin at bedtime. 06/10/17  Yes Rogelia Mire, NP  meclizine (ANTIVERT) 25 MG tablet Take 1 tablet (25 mg total) by mouth 2 (two) times daily as needed for dizziness. 06/10/17  Yes Rogelia Mire, NP  Polyvinyl Alcohol-Povidone (REFRESH OP) Place 1 drop into both eyes 3 (three) times daily as needed (for dry eyes). Reported on 12/25/2015   Yes [provider]  potassium chloride SA (K-DUR,KLOR-CON) 20 MEQ tablet Take 1.5 tablets  (30 mEq total) by mouth daily. 06/10/17  Yes Rogelia Mire, NP  propranolol (INDERAL) 40 MG tablet Take 40 mg by mouth 3 (three) times daily. 10/25/16  Yes [provider]  psyllium (METAMUCIL) 58.6 % packet Take 1 packet by mouth daily as needed (constipation).   Yes [provider]  tamsulosin (FLOMAX) 0.4 MG CAPS capsule Take 0.4 mg by mouth at bedtime. 05/30/17  Yes [provider]  VENTOLIN HFA 108 (90 Base) MCG/ACT inhaler inhale 2 puffs by mouth every 6 hours if needed for wheezing or shortness of breath 05/04/17  Yes Ann Held, DO    Family History Family History  Problem Relation Age of Onset  . CAD Brother        2 brothers - CABG  . Alzheimer's disease Sister   . CAD Sister   . Prostate cancer Brother   . Asthma Brother        2 brothers   . Diabetes Other        entire family  . Colon cancer Neg Hx     Social History Social History  Substance Use Topics  . Smoking status: Never Smoker  . Smokeless tobacco: Never Used  . Alcohol use No     Allergies   Pneumococcal vaccines   Review of Systems Review of Systems  All other systems reviewed and are negative.    Physical Exam Updated Vital Signs BP (!) 131/50 (BP Location: Left Arm)   Pulse 74   Temp (!) 97.5 F (36.4 C) (Oral)   Resp 18   Ht 5\' 8"  (1.727 m)   Wt 124.2 kg (273 lb 12.8 oz)   SpO2 98%   BMI 41.63 kg/m   Physical Exam  Constitutional: He appears well-developed and well-nourished.  HENT:  Head: Normocephalic and atraumatic.  Right Ear: External ear normal.  Left Ear: External ear normal.  Nose: Nose normal.  Mouth/Throat: Uvula is midline, oropharynx is clear and moist and mucous membranes are normal. No tonsillar exudate.  Eyes: Pupils are equal, round, and reactive to light. Right eye exhibits no discharge. Left eye exhibits no discharge. No scleral icterus.  Neck: Trachea normal, normal range of motion, full passive range of motion without  pain and phonation normal. Neck supple. No hepatojugular reflux and no JVD present. No spinous process tenderness present. Carotid bruit is not present. No neck rigidity. Normal range of motion present.  Cardiovascular: Normal rate, regular rhythm and intact distal pulses.   No murmur heard. Pulses:      Radial pulses are 2+ on the right side, and 2+ on the left side.       Femoral pulses are 2+ on the right side, and 2+ on the left side.      Dorsalis pedis pulses are 2+ on the right side, and 2+ on the left side.       Posterior tibial pulses are 2+ on the right side, and 2+ on the left side.  1+ bilateral lower extremity edema to level of lower calf.  Calves symmetric in size bilaterally.  Pulmonary/Chest: Effort normal. No respiratory distress. He has decreased breath sounds. He exhibits no tenderness.  Abdominal: Soft. Bowel sounds are normal. He exhibits no distension and no pulsatile midline mass. There is no tenderness. There is no rigidity, no rebound and no guarding.  Musculoskeletal: He exhibits no edema.       Left shoulder: He exhibits tenderness. He exhibits normal range of motion.  Lymphadenopathy:    He has no cervical adenopathy.  Neurological: He is alert.  Speech clear. Follows commands. No facial droop. PERRLA. EOM grossly intact. CN III-XII grossly intact. Grossly moves all extremities 4 without ataxia. Able and appropriate strength for age to upper and lower extremities bilaterally including grip strength.   Skin: Skin is warm, dry and intact. Capillary refill takes less than 2 seconds. No rash noted. He is not diaphoretic.  No Janeway lesions or splinter hemorrhages.   Psychiatric: He has a normal mood and affect.  Nursing note and vitals reviewed.    ED Treatments / Results  Labs (all labs ordered are listed, but only abnormal results are displayed) Labs Reviewed  BASIC METABOLIC PANEL - Abnormal; Notable for the following:       Result Value   Potassium 3.0 (*)     Glucose, Bld 257 (*)    Creatinine, Ser 1.32 (*)    Calcium 8.6 (*)    GFR calc non Af Amer 51 (*)    GFR calc Af Amer 59 (*)    All other components within normal limits  CBC - Abnormal; Notable for the following:    RBC 4.17 (*)    Hemoglobin 11.7 (*)    HCT 36.0 (*)    All other components within normal limits  MAGNESIUM - Abnormal; Notable for the following:    Magnesium 1.6 (*)    All other components within normal limits  GLUCOSE, CAPILLARY - Abnormal; Notable for the following:    Glucose-Capillary 370 (*)    All other components within normal limits  CBG MONITORING, ED - Abnormal; Notable for the following:    Glucose-Capillary 48 (*)    All other components within normal limits  CBG MONITORING, ED - Abnormal; Notable for the following:    Glucose-Capillary 247 (*)    All other components within normal limits  BRAIN NATRIURETIC PEPTIDE  TROPONIN I  BASIC METABOLIC PANEL  MAGNESIUM  TROPONIN I  I-STAT TROPONIN, ED    EKG  EKG Interpretation  Date/Time:  Saturday July 15 2017 11:01:58 EDT Ventricular Rate:  75 PR Interval:    QRS Duration: 94 QT Interval:  387 QTC Calculation: 433 R Axis:   73 Text Interpretation:  Sinus rhythm Atrial premature complexes Prolonged PR interval Anterior infarct, old aside from artifact, similar to previous Confirmed by Theotis Burrow [phone]) on 07/15/2017 2:21:30 PM  Radiology Dg Chest 2 View  Result Date: 07/15/2017 CLINICAL DATA:  Chest pain. EXAM: CHEST  2 VIEW COMPARISON:  June 07, 2017 FINDINGS: Cardiomegaly. The hila and mediastinum are normal. Mild opacity in the bases may have an atelectatic component. However, developing infiltrate, particularly in the left base is not completely excluded. Recommend clinical correlation and follow-up as clinically warranted. No edema. IMPRESSION: Mild opacity in the bases, left greater than right could represent atelectasis or developing infiltrate. Recommend follow-up to  resolution. Electronically Signed   By: Dorise Bullion III M.D   On: 07/15/2017 13:09   Dg Shoulder Left  Result Date: 07/15/2017 CLINICAL DATA:  Pain. EXAM: LEFT SHOULDER - 2+ VIEW COMPARISON:  February 06, 2017 FINDINGS: Extensive remodeling of the humeral head with loss of joint space and osteophytosis, unchanged since April 2018, consistent with a chronic osteoarthritic process. No fracture or dislocation. No other acute abnormalities. IMPRESSION: Severe degenerative changes.  No fracture or dislocation. Electronically Signed   By: Dorise Bullion III M.D   On: 07/15/2017 13:07    Procedures Procedures (including critical care time)  Medications Ordered in ED Medications  enoxaparin (LOVENOX) injection 30 mg (not administered)  doxycycline (VIBRA-TABS) tablet 100 mg (not administered)  saccharomyces boulardii (FLORASTOR) capsule 250 mg (not administered)  acetaminophen (TYLENOL) tablet 1,000 mg (not administered)  amLODipine (NORVASC) tablet 5 mg (not administered)  aspirin EC tablet 81 mg (not administered)  clopidogrel (PLAVIX) tablet 75 mg (not administered)  furosemide (LASIX) tablet 40 mg (40 mg Oral Given 07/15/17 2032)  gabapentin (NEURONTIN) capsule 300 mg (not administered)  meclizine (ANTIVERT) tablet 25 mg (not administered)  potassium chloride (K-DUR,KLOR-CON) CR tablet 30 mEq (not administered)  propranolol (INDERAL) tablet 40 mg (not administered)  tamsulosin (FLOMAX) capsule 0.4 mg (not administered)  insulin aspart protamine- aspart (NOVOLOG MIX 70/30) injection 40 Units (not administered)  insulin aspart protamine- aspart (NOVOLOG MIX 70/30) injection 30 Units (30 Units Subcutaneous Given 07/15/17 2029)  albuterol (PROVENTIL) (2.5 MG/3ML) 0.083% nebulizer solution 2.5 mg (not administered)  insulin aspart (novoLOG) injection 0-15 Units (not administered)  albuterol (PROVENTIL) (2.5 MG/3ML) 0.083% nebulizer solution 2.5 mg (2.5 mg Nebulization Given 07/15/17 2131)    nitroGLYCERIN (NITROGLYN) 2 % ointment 1 inch (1 inch Topical Given 07/15/17 1152)  potassium chloride SA (K-DUR,KLOR-CON) CR tablet 40 mEq (40 mEq Oral Given 07/15/17 1335)  morphine 4 MG/ML injection 4 mg (4 mg Intravenous Given 07/15/17 1424)  furosemide (LASIX) injection 40 mg (40 mg Intravenous Given 07/15/17 1708)  magnesium sulfate IVPB 2 g 50 mL (0 g Intravenous Stopped 07/15/17 1840)  potassium chloride 10 mEq in 100 mL IVPB (0 mEq Intravenous Stopped 07/15/17 1846)     Initial Impression / Assessment and Plan / ED Course  I have reviewed the triage vital signs and the nursing notes.  Pertinent labs & imaging results that were available during my care of the patient were reviewed by me and considered in my medical decision making (see chart for details).     Edward [name] is a 77 y.o. male with a history of HTN, TIA, HLD, GERD, Insulin dependent DM2, OSA, Diastolic CHF, CKD3, obesity, CAD s/p two-vessel coronary artery bypass grafting in 2018 for ostial circumflex disease and stent placement in 2016 who presents today for chest pain that began Tuesday. The patient states that Tuesday afternoon while the patient was at rest he had gradual onset of left chest tightness with radiation to left shoulder that radiates down the arm as well  as left and right neck. He says this feels similar to when he had a MI in the past but at that time it was "elephant sitting on my chest and now it is just a lion". No radiation to the jaw, right shoulder or back. Patient states the pain is not positional, exertional and not worsened with deep breathing. The pain has gradually increased to now a 10/10. This morning when he awoke he states that his sheets were soaked and he was covered in sweat. This prompted him to call EMS. On route the patient had normal EKG. He was given 324 ASA and 3 SL NTG without relief. EMS reports shoulder pain reproducible on exam. Patient recently admitted on 06/06/17 for acute on chronic  diastolic HF. He had net negative 5.2 L during the admission and d/c weight was 271. He states that he weighed himself this morning at 271 but thought his baseline was 269. Patient admits to mild increase in swelling of the b/l lower legs over the last week. He admits to non-productive cough and sob upon awakening that is relieved after he takes his morning inhaler. Patient last echo on 06/08/17 showed EF 60-65%, with normal wall motion and G1DD. Patient cardiologist is Dr. Daneen Schick.   Patient vital signs reassuring on presentation. He is non-toxic appearing in NAD. He has 1+ bilateral lower extremity edema to level of lower calves, decreased breath sounds b/l and TTP left shoulder pain. Will obtain basic labs, Tn, BNP, CXR, left shoulder xray, and EKG.   The patient EKG similar to previous without ischemic changes. Tn 0.00. CBC without leukocytosis and otherwise similar to previous results. BMP with K 3.0. Will replace orally and check Mg. Cr similar to past. Glucose 257, no anion gap acidosis. BNP 26.8 but patient admitted last month with normal BNP and had 5.2L of fluid drained so question if HF. CXR with mild opacity in the bases, left greater than right could represent atelectasis or developing infiltrate. Possible atypical PNA. Left shoulder xray with degenerative changes which may be causing patient shoulder pain.   Mg mildly low. Will give 2g IV and give 9mEq of IV K.   Will consult cardiology. Dr. Bronson Ing returned call and evaluated the patient. He states this is a mixture of typical and atypical symptoms. Gave patient 40mg  IV lasix for edema. Question of atypical PNA vs atypical CP vs HF (patient with lower leg swelling and had normal BNP with last presentation when admitted for HF). Recommended hospital to admit. Will consult hospitalist. Dr. Rodena Piety to admit.   Patient case seen and discussed with Dr. Rex Kras who is in agreement with plan.   Final Clinical Impressions(s) / ED Diagnoses    Final diagnoses:  Atypical chest pain  Cough  SOB (shortness of breath)    New Prescriptions Current Discharge Medication List       Lorelle Gibbs 07/15/17 2138    Little, Wenda Overland, MD 07/15/17 2150

## 2017-07-15 NOTE — ED Triage Notes (Signed)
Pt arrived via [facility] c/o of cp x2-3 days. Pt was initially hypertensive @ 202/96 for EMS. EMS gave 3 nitro SL and 324 ASA without pain relief. BP reduced 131/72. HR 70-80 with EMS. Pt states he awoke this morning drenched in sweat. Pt has hx of DM (EMS CBG 221), previous MI, and CHF. Pt recently hospitalized for CHF complications. Pt is alert and oriented x4. Stated pain 10/10 at time of triage.

## 2017-07-15 NOTE — Consult Note (Signed)
Cardiology Consultation:   Patient ID: Edward [name]; [id number]; 1941/06/26   Admit date: 07/15/2017 Date of Consult: 07/15/2017  Primary Care Provider: Ann Held, DO Primary Cardiologist: Dr. Tamala Julian   Patient Profile:   Edward [name] is a 76 y.o. male with a hx of CAD and CABG in January 2018 who is being seen today for the evaluation of chest pain and left shoulder and arm pain at the request of M. Maczis PA-C.  History of Present Illness:   Edward [name] is a 76 yr old male with a h/o 2-vessel CABG in January 2018, OSA, morbid obesity, hypertension, and chronic diastolic heart failure who presented to the ED with chest pain and left shoulder and arm pain. He said symptoms began last Tuesday and have been waxing and waning. He said they do not feel like what he experienced prior to CABG. At that time, he said "It felt like an elephant was sitting on my chest". He has been wheezing more and noticed that today and also had profound sweating. He recorded his temp at 99.5 at home earlier this week when he felt like he had a fever. He also thinks his weight is up 1 lb from baseline (271 by home scales, usually 270 lbs).  He was given ASA and nitro without relief. Morphine has now brought down the pain from 10/10 to 4/10.  He was discharged on 06/10/17 after being hospitalized for acute diastolic heart failure.  Echo at that time showed normal LV systolic function and regional wall motion, LVEF 60-65%, with grade 1 diastolic dysfunction and elevated filling pressures.  I-stat troponin and BNP are normal (of note, BNP was normal at last hospitalization as well when he was fluid overloaded).  No leukocytosis and afebrile in ED.  K low at 3, creatinine 1.32.  Chest xray showed mild bibasilar opacities, left>right, favoring atelectasis vs infiltrates.  Left shoulder xray showed severe degenerative changes without fracture or dislocation.  ECG showed sinus rhythm with old  anteroseptal infarct (unchanged from 05/2017).    Past Medical History:  Diagnosis Date  . Adenomatous colon polyp 04/2011  . CAD (coronary artery disease)    a. 01/2015 DES to LAD  b. 12/2015: [place] 85% oLCx lesion--> rx therapy.  . Chronic diastolic CHF (congestive heart failure) ([place])    a. 05/2017 Echo: EF 60-65%, Gr1 DD, no rwma, mildly dil LA, nl RV fxn, mild TR.  . CKD (chronic kidney disease), stage II    a. probable CKD II-III with baseline CR 1.1-1.3.  . DM type 2 (diabetes mellitus, type 2), insulin dependent 01/29/2014   fasting cbg 50-120 with new regimen  . Dyslipidemia, goal LDL below 70 01/29/2014  . Enlarged prostate   . Essential tremor    a. on proprnolol  . GERD (gastroesophageal reflux disease)   . Hypertension   . Internal hemorrhoid   . Sleep apnea    does not use cpap (06/04/2015)  . TIA (transient ischemic attack) 2002    Past Surgical History:  Procedure Laterality Date  . BACK SURGERY    . CARDIAC CATHETERIZATION  01/28/14   + CAD treat medically  . CARDIAC CATHETERIZATION N/A 12/21/2015   Procedure: Right/Left Heart Cath and Coronary Angiography;  Surgeon: Belva Crome, MD;  Location: [place] CV LAB;  Service: Cardiovascular;  Laterality: N/A;  . CARDIAC CATHETERIZATION N/A 11/02/2016   Procedure: Left Heart Cath and Coronary Angiography;  Surgeon: Nelva Bush, MD;  Location: [place]  CV LAB;  Service: Cardiovascular;  Laterality: N/A;  . CATARACT EXTRACTION Bilateral   . CATARACT EXTRACTION, BILATERAL Bilateral   . CORONARY ANGIOPLASTY WITH STENT PLACEMENT  01/30/2015   DES [place]  [place] to LAD by Dr Tamala Julian  . CORONARY ARTERY BYPASS GRAFT N/A 11/09/2016   Procedure: CORONARY ARTERY BYPASS GRAFTING (CABG) x 2;  Surgeon: Melrose Nakayama, MD;  Location: [place];  Service: Open Heart Surgery;  Laterality: N/A;  . ENDOVEIN HARVEST OF GREATER SAPHENOUS VEIN Left 11/09/2016   Procedure: ENDOVEIN HARVEST OF GREATER SAPHENOUS VEIN;  Surgeon: Melrose Nakayama, MD;  Location: [place];  Service: Open Heart Surgery;  Laterality: Left;  . JOINT REPLACEMENT    . LEFT HEART CATHETERIZATION WITH CORONARY ANGIOGRAM N/A 01/28/2014   Procedure: LEFT HEART CATHETERIZATION WITH CORONARY ANGIOGRAM;  Surgeon: Sinclair Grooms, MD;  Location: [facility] CATH LAB;  Service: Cardiovascular;  Laterality: N/A;  . LEFT HEART CATHETERIZATION WITH CORONARY ANGIOGRAM N/A 01/30/2015   Procedure: LEFT HEART CATHETERIZATION WITH CORONARY ANGIOGRAM;  Surgeon: Belva Crome, MD;  Location: [facility] CATH LAB;  Service: Cardiovascular;  Laterality: N/A;  . LUMBAR [place]    . SHOULDER ARTHROSCOPY Right 07/30/2014   Procedure: Right Shoulder Arthroscopy, Debridement, Decompression, Manipulation Under Anesthesia;  Surgeon: Newt Minion, MD;  Location: [place];  Service: Orthopedics;  Laterality: Right;  . TEE WITHOUT CARDIOVERSION N/A 11/09/2016   Procedure: TRANSESOPHAGEAL ECHOCARDIOGRAM (TEE);  Surgeon: Melrose Nakayama, MD;  Location: [place];  Service: Open Heart Surgery;  Laterality: N/A;  . TOTAL KNEE ARTHROPLASTY Bilateral        Inpatient Medications: Scheduled Meds:  Continuous Infusions:  PRN Meds:   Allergies:    Allergies  Allergen Reactions  . Pneumococcal Vaccines Anaphylaxis    Social History:   Social History   Social History  . Marital status: Married    Spouse name: N/A  . Number of children: 0  . Years of education: N/A   Occupational History  . retired    Social History Main Topics  . Smoking status: Never Smoker  . Smokeless tobacco: Never Used  . Alcohol use No  . Drug use: No  . Sexual activity: No   Other Topics Concern  . 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   He lives alone.  His wife is living in [place] at this time.  They do not have children.   Highest level of education:  College graduate    Family History:   The  patient's family history includes Alzheimer's disease in his sister; Asthma in his brother; CAD in his brother and sister; Diabetes in his other; Prostate cancer in his brother.   Family History  Problem Relation Age of Onset  . CAD Brother        2 brothers - CABG  . Alzheimer's disease Sister   . CAD Sister   . Prostate cancer Brother   . Asthma Brother        2 brothers   . Diabetes Other        entire family  . Colon cancer Neg Hx      ROS:  Please see the history of present illness.  ROS  All other ROS reviewed and negative.     Physical Exam/Data:   Vitals:   07/15/17 1315 07/15/17 1330 07/15/17 1345 07/15/17 1400  BP:  (!) 121/55  (!) 136/58  Pulse: 62 64 63 60  Resp:      Temp:      TempSrc:      SpO2: 100% 99% 99% 99%  Weight:      Height:       No intake or output data in the 24 hours ending 07/15/17 1530 Filed Weights   07/15/17 1136  Weight: 271 lb (122.9 kg)   Body mass index is 41.21 kg/m.  General:  Well nourished, well developed, in no acute distress HEENT: normal Lymph: no adenopathy Neck: no JVD Endocrine:  No thryomegaly Cardiac:  normal S1, S2; RRR; no murmur  Lungs: diminished sounds at bases, left basilar crackles Abd: soft, nontender, protuberant  Ext: Trace bilateral lower extremity edema Musculoskeletal:  No deformities, BUE and BLE strength normal and equal Skin: warm and dry  Neuro:  CNs 2-12 intact, no focal abnormalities noted Psych:  Normal affect    Relevant CV Studies:  Echocardiogram 06/08/17:  Study Conclusions  - Left ventricle: The cavity size was normal. There was severe   concentric hypertrophy. Systolic function was normal. The   estimated ejection fraction was in the range of 60% to 65%. Wall   motion was normal; there were no regional wall motion   abnormalities. Doppler parameters are consistent with abnormal   left ventricular relaxation (grade 1 diastolic dysfunction).   There was no evidence of elevated  ventricular filling pressure by   Doppler parameters. - Aortic valve: Trileaflet; normal thickness leaflets. There was no   regurgitation. - Aortic root: The aortic root was normal in size. - Mitral valve: There was no regurgitation. - Left atrium: The atrium was mildly dilated. - Right ventricle: Systolic function was normal. - Tricuspid valve: There was mild regurgitation. - Pulmonary arteries: Systolic pressure was within the normal   range. - Inferior vena cava: The vessel was not visualized. - Pericardium, extracardiac: There was no pericardial effusion.  Laboratory Data:  Chemistry Recent Labs Lab 07/15/17 1106  NA 138  K 3.0*  CL 102  CO2 29  GLUCOSE 257*  BUN 11  CREATININE 1.32*  CALCIUM 8.6*  GFRNONAA 51*  GFRAA 59*  ANIONGAP 7    No results for input(s): PROT, ALBUMIN, AST, ALT, ALKPHOS, BILITOT in the last 168 hours. Hematology Recent Labs Lab 07/15/17 1106  WBC 6.3  RBC 4.17*  HGB 11.7*  HCT 36.0*  MCV 86.3  MCH 28.1  MCHC 32.5  RDW 13.5  PLT 200   Cardiac EnzymesNo results for input(s): TROPONINI in the last 168 hours.  Recent Labs Lab 07/15/17 1139  TROPIPOC 0.00    BNP Recent Labs Lab 07/15/17 1109  BNP 26.8    DDimer No results for input(s): DDIMER in the last 168 hours.  Radiology/Studies:  Dg Chest 2 View  Result Date: 07/15/2017 CLINICAL DATA:  Chest pain. EXAM: CHEST  2 VIEW COMPARISON:  June 07, 2017 FINDINGS: Cardiomegaly. The hila and mediastinum are normal. Mild opacity in the bases may have an atelectatic component. However, developing infiltrate, particularly in the left base is not completely excluded. Recommend clinical correlation and follow-up as clinically warranted. No edema. IMPRESSION: Mild opacity in the bases, left greater than right could represent atelectasis or developing infiltrate. Recommend follow-up to resolution. Electronically Signed   By: Dorise Bullion III M.D   On: 07/15/2017 13:09   Dg Shoulder  Left  Result Date: 07/15/2017 CLINICAL DATA:  Pain. EXAM: LEFT SHOULDER - 2+ VIEW  COMPARISON:  February 06, 2017 FINDINGS: Extensive remodeling of the humeral head with loss of joint space and osteophytosis, unchanged since April 2018, consistent with a chronic osteoarthritic process. No fracture or dislocation. No other acute abnormalities. IMPRESSION: Severe degenerative changes.  No fracture or dislocation. Electronically Signed   By: Dorise Bullion III M.D   On: 07/15/2017 13:07    Assessment and Plan:   1. Chest pain and left shoulder and arm pain with wheezing and diaphoresis: Has a mixed picture of typical and atypical symptoms. I-stat troponin is normal. ECG is unchanged from prior. Also has severe degenerative arthritic changes in shoulder. Would recommend checking serial troponins. If other etiologies are ruled out, could consider a Lexiscan Myoview stress test. He is hypokalemic and this requires repletion, as this can lead to muscular cramping (in his chest and arm). He also may have a developing pulmonary infiltrate, albeit he has no leukocytosis and is currently afebrile.  2. Hypertension: Controlled. No changes.  3. Chronic diastolic heart failure: He has trace pretibial edema bilaterally. He is up 1 lb on his home scales. I will give one dose of 40 mg IV Lasix. Afterwards, home diuretic regimen can be restarted.  4. OSA: Needs CPAP.  5. Hypokalemia: Needs K > 4.  6. CAD with 2-v CABG: See #1. Continue ASA, Plavix, and propranolol. Unclear to me why he is not on statin therapy.   Dispo: Hospitalist service to admit. We will follow along.    For questions or updates, please contact [place] Please consult [email or web address] for contact info under Cardiology/STEMI.   Signed, Kate Sable, MD  07/15/2017 3:30 PM

## 2017-07-16 ENCOUNTER — Inpatient Hospital Stay (HOSPITAL_COMMUNITY): Payer: Medicare Other

## 2017-07-16 DIAGNOSIS — I25810 Atherosclerosis of coronary artery bypass graft(s) without angina pectoris: Secondary | ICD-10-CM

## 2017-07-16 DIAGNOSIS — I5033 Acute on chronic diastolic (congestive) heart failure: Secondary | ICD-10-CM

## 2017-07-16 DIAGNOSIS — J9811 Atelectasis: Secondary | ICD-10-CM

## 2017-07-16 DIAGNOSIS — I2510 Atherosclerotic heart disease of native coronary artery without angina pectoris: Secondary | ICD-10-CM

## 2017-07-16 DIAGNOSIS — R0602 Shortness of breath: Secondary | ICD-10-CM

## 2017-07-16 DIAGNOSIS — N183 Chronic kidney disease, stage 3 (moderate): Secondary | ICD-10-CM

## 2017-07-16 LAB — BASIC METABOLIC PANEL
Anion gap: 6 (ref 5–15)
BUN: 9 mg/dL (ref 6–20)
CHLORIDE: 101 mmol/L (ref 101–111)
CO2: 31 mmol/L (ref 22–32)
Calcium: 8.3 mg/dL — ABNORMAL LOW (ref 8.9–10.3)
Creatinine, Ser: 1.11 mg/dL (ref 0.61–1.24)
GFR calc Af Amer: >60 mL/min (ref >60)
GFR calc non Af Amer: >60 mL/min (ref >60)
GLUCOSE: 257 mg/dL — AB (ref 65–99)
POTASSIUM: 3.6 mmol/L (ref 3.5–5.1)
Sodium: 138 mmol/L (ref 135–145)

## 2017-07-16 LAB — GLUCOSE, CAPILLARY
GLUCOSE-CAPILLARY: 303 mg/dL — AB (ref 65–99)
GLUCOSE-CAPILLARY: 279 mg/dL — AB (ref 65–99)
GLUCOSE-CAPILLARY: 268 mg/dL — AB (ref 65–99)
GLUCOSE-CAPILLARY: 353 mg/dL — AB (ref 65–99)
Glucose-Capillary: 285 mg/dL — ABNORMAL HIGH (ref 65–99)
Glucose-Capillary: 343 mg/dL — ABNORMAL HIGH (ref 65–99)

## 2017-07-16 LAB — TROPONIN I: Troponin I: <0.03 ng/mL (ref <0.03)

## 2017-07-16 LAB — MAGNESIUM: Magnesium: 1.7 mg/dL (ref 1.7–2.4)

## 2017-07-16 MED ORDER — ALBUTEROL SULFATE (2.5 MG/3ML) 0.083% IN NEBU
2.5000 mg | INHALATION_SOLUTION | Freq: Two times a day (BID) | RESPIRATORY_TRACT | Status: DC
  Administered 2017-07-17: 2.5 mg via RESPIRATORY_TRACT
  Filled 2017-07-16 (×2): qty 3

## 2017-07-16 MED ORDER — FUROSEMIDE 10 MG/ML IJ SOLN
40.0000 mg | Freq: Once | INTRAMUSCULAR | Status: AC
  Administered 2017-07-16: 40 mg via INTRAVENOUS
  Filled 2017-07-16: qty 4

## 2017-07-16 MED ORDER — INSULIN ASPART PROT & ASPART (70-30 MIX) 100 UNIT/ML ~~LOC~~ SUSP
50.0000 [IU] | Freq: Every day | SUBCUTANEOUS | Status: DC
  Administered 2017-07-16 – 2017-07-17 (×2): 50 [IU] via SUBCUTANEOUS
  Filled 2017-07-16: qty 10

## 2017-07-16 MED ORDER — POLYETHYLENE GLYCOL 3350 17 G PO PACK
17.0000 g | PACK | Freq: Every day | ORAL | Status: DC
  Administered 2017-07-16 – 2017-07-17 (×2): 17 g via ORAL
  Filled 2017-07-16 (×2): qty 1

## 2017-07-16 MED ORDER — NITROGLYCERIN 0.4 MG SL SUBL
0.4000 mg | SUBLINGUAL_TABLET | SUBLINGUAL | Status: DC | PRN
  Administered 2017-07-16: 0.4 mg via SUBLINGUAL
  Filled 2017-07-16: qty 1

## 2017-07-16 MED ORDER — INSULIN ASPART PROT & ASPART (70-30 MIX) 100 UNIT/ML ~~LOC~~ SUSP
40.0000 [IU] | Freq: Every day | SUBCUTANEOUS | Status: DC
  Administered 2017-07-16: 40 [IU] via SUBCUTANEOUS
  Filled 2017-07-16: qty 10

## 2017-07-16 MED ORDER — CLONAZEPAM 0.5 MG PO TABS
0.5000 mg | ORAL_TABLET | Freq: Two times a day (BID) | ORAL | Status: DC | PRN
  Administered 2017-07-16: 0.5 mg via ORAL
  Filled 2017-07-16: qty 1

## 2017-07-16 MED ORDER — NITROGLYCERIN 0.4 MG SL SUBL
SUBLINGUAL_TABLET | SUBLINGUAL | Status: AC
  Administered 2017-07-16: 0.4 mg
  Filled 2017-07-16: qty 1

## 2017-07-16 MED ORDER — INSULIN ASPART 100 UNIT/ML ~~LOC~~ SOLN
10.0000 [IU] | Freq: Once | SUBCUTANEOUS | Status: AC
  Administered 2017-07-16: 10 [IU] via SUBCUTANEOUS

## 2017-07-16 NOTE — Progress Notes (Signed)
Progress Note  Patient Name: Edward [name] Date of Encounter: 07/16/2017  Primary Cardiologist: Dr. Tamala Julian  Subjective   Feeling less short of breath today. Sitting up in chair. Follow up chest xray ordered by IM and is pending.  Inpatient Medications    Scheduled Meds: . albuterol  2.5 mg Nebulization QID  . amLODipine  5 mg Oral Daily  . aspirin EC  81 mg Oral Daily  . clopidogrel  75 mg Oral Daily  . doxycycline  100 mg Oral Q12H  . enoxaparin (LOVENOX) injection  30 mg Subcutaneous Q24H  . furosemide  40 mg Intravenous Once  . furosemide  40 mg Oral BID  . gabapentin  300 mg Oral BID  . insulin aspart  0-15 Units Subcutaneous TID WC  . insulin aspart protamine- aspart  40 Units Subcutaneous Q supper  . insulin aspart protamine- aspart  50 Units Subcutaneous Q breakfast  . potassium chloride SA  30 mEq Oral Daily  . propranolol  40 mg Oral TID  . saccharomyces boulardii  250 mg Oral BID  . tamsulosin  0.4 mg Oral QHS   Continuous Infusions:  PRN Meds: acetaminophen, albuterol, clonazePAM, meclizine   Vital Signs    Vitals:   07/16/17 0324 07/16/17 0328 07/16/17 0506 07/16/17 0859  BP: (!) 149/58 109/62 (!) 141/55   Pulse: 72 65 68   Resp:      Temp:   97.6 F (36.4 C)   TempSrc:   Oral   SpO2: 100% 99% 100% 95%  Weight:   271 lb 14.4 oz (123.3 kg)   Height:        Intake/Output Summary (Last 24 hours) at 07/16/17 1123 Last data filed at 07/16/17 1005  Gross per 24 hour  Intake              684 ml  Output             1610 ml  Net             -926 ml   Filed Weights   07/15/17 1136 07/15/17 1826 07/16/17 0506  Weight: 271 lb (122.9 kg) 273 lb 12.8 oz (124.2 kg) 271 lb 14.4 oz (123.3 kg)    Telemetry    Sinus rhythm - Personally Reviewed  ECG    Sinus rhythm with 1st degree AV block and old anteroseptal infarct - Personally Reviewed  Physical Exam   GEN: No acute distress.   Neck: No JVD Cardiac: RRR, no murmurs, rubs, or gallops.    Respiratory: Clear to auscultation bilaterally. GI: Soft, nontender, non-distended  MS: Trace b/l LE edema; No deformity. Neuro:  Nonfocal  Psych: Normal affect   Labs    Chemistry Recent Labs Lab 07/15/17 1106 07/16/17 0444  NA 138 138  K 3.0* 3.6  CL 102 101  CO2 29 31  GLUCOSE 257* 257*  BUN 11 9  CREATININE 1.32* 1.11  CALCIUM 8.6* 8.3*  GFRNONAA 51* >60  GFRAA 59* >60  ANIONGAP 7 6     Hematology Recent Labs Lab 07/15/17 1106  WBC 6.3  RBC 4.17*  HGB 11.7*  HCT 36.0*  MCV 86.3  MCH 28.1  MCHC 32.5  RDW 13.5  PLT 200    Cardiac Enzymes Recent Labs Lab 07/15/17 1730 07/15/17 2308  TROPONINI <0.03 <0.03    Recent Labs Lab 07/15/17 1139  TROPIPOC 0.00     BNP Recent Labs Lab 07/15/17 1109  BNP 26.8     DDimer  No results for input(s): DDIMER in the last 168 hours.   Radiology    Dg Chest 2 View  Result Date: 07/15/2017 CLINICAL DATA:  Chest pain. EXAM: CHEST  2 VIEW COMPARISON:  June 07, 2017 FINDINGS: Cardiomegaly. The hila and mediastinum are normal. Mild opacity in the bases may have an atelectatic component. However, developing infiltrate, particularly in the left base is not completely excluded. Recommend clinical correlation and follow-up as clinically warranted. No edema. IMPRESSION: Mild opacity in the bases, left greater than right could represent atelectasis or developing infiltrate. Recommend follow-up to resolution. Electronically Signed   By: Dorise Bullion III M.D   On: 07/15/2017 13:09   Dg Shoulder Left  Result Date: 07/15/2017 CLINICAL DATA:  Pain. EXAM: LEFT SHOULDER - 2+ VIEW COMPARISON:  February 06, 2017 FINDINGS: Extensive remodeling of the humeral head with loss of joint space and osteophytosis, unchanged since April 2018, consistent with a chronic osteoarthritic process. No fracture or dislocation. No other acute abnormalities. IMPRESSION: Severe degenerative changes.  No fracture or dislocation. Electronically Signed    By: Dorise Bullion III M.D   On: 07/15/2017 13:07    Cardiac Studies   None  Patient Profile     76 y.o. male with h/o CABG and diastolic heart failure admitted for chest pain, left shoulder pain, wheezing, and diaphoresis.  Assessment & Plan    1. Chest pain and left shoulder and arm pain with wheezing and diaphoresis: Symptoms have improved. Has a mixed picture of typical and atypical symptoms. Troponins are normal. ECG is unchanged from prior. Also has severe degenerative arthritic changes in shoulder. If other etiologies are ruled out, could consider a Lexiscan Myoview stress test. He was hypokalemic at time of symptoms, and this has been repleted (can lead to muscular cramping in his chest and arm). He also may have a developing pulmonary infiltrate, albeit he has no leukocytosis and is currently afebrile. Repeat chest xray pending.  2. Hypertension: Mildly elevated. No changes.  3. Acute on chronic diastolic heart failure: He has trace pretibial edema bilaterally. He is up 1 lb on his home scales. I gave him one dose of 40 mg IV Lasix yesterday and he put out almost 1.2 L. I will provide another 40 mg IV today. He will also remain on home diuretic regimen. Repeat chest xray pending.  4. OSA: Needs CPAP.  5. Hypokalemia: Needs K > 4. Up to 3.6 today.  6. CAD with 2-v CABG: See #1. Continue ASA, Plavix, and propranolol. Unclear to me why he is not on statin therapy.   For questions or updates, please contact [place] Please consult [email or web address] for contact info under Cardiology/STEMI.      Signed, Kate Sable, MD  07/16/2017, 11:23 AM

## 2017-07-16 NOTE — Progress Notes (Addendum)
1540 first dose of NItro SL given, EKG and vitals stable so far. After 5 min chest pain resolved, does not need any further dose of Nitro, will continue to monitor

## 2017-07-16 NOTE — Progress Notes (Signed)
PROGRESS NOTE    Edward [name]  DJM:[id number] DOB: December 19, 1940 DOA: 07/15/2017 PCP: Ann Held, DO  Brief Narrative: Edward [name] is a 76 yr old male with a h/o 2-vessel CABG in January 2018, OSA, morbid obesity, hypertension, and chronic diastolic heart failure who presented to the ED with chest pain and left shoulder and arm pain. Also reported dyspnea on exertion and feeling hot and cold. No fever or leukocytosis in the emergency room chest x-ray showed atelectasis versus developing pneumonia   Assessment & Plan:   Principal Problem:   Chest pain/left shoulder and on pain -Intermittent, appears to have improved now, required nitroglycerin 1 overnight -Troponins negative -He had a CABG in February 2018 -Chest x-ray shows atelectasis versus developing infiltrate, he has no leukocytosis and is afebrile without a productive cough-we'll repeat chest x-ray, will continue oral doxycycline for now, low threshold to stop this -To me he appears slightly volume overloaded was given a single dose of IV Lasix yesterday and home by mouth Lasix resumed -Appreciate cardiology input, await recommendations today  Acute on chronic diastolic CHF -Has 2-2+ edema, despite being only 1 pound over his reported to dry weight -Status post single dose IV Lasix yesterday and now on 40 mg by mouth Lasix twice a day -Will defer to cardiology -Repeat chest x-ray today  OSA -Continue sleep apnea  Hypokalemia -Replaced  CAD/CABG -Continue aspirin Plavix and propranolol -See above  Diabetes on insulin -CBGs uncontrolled, will increase insulin 7030 dose in the morning and p.m. dose  DVT prophylaxis: Lovenox Code Status: Full code Family Communication: No family at bedside Disposition Plan: Pending cards plans for further workup  Consultants:   Cards  Antimicrobials:    Subjective: Mild dyspnea with exertion, dry cough  Objective: Vitals:   07/16/17 0324 07/16/17 0328 07/16/17 0506  07/16/17 0859  BP: (!) 149/58 109/62 (!) 141/55   Pulse: 72 65 68   Resp:      Temp:   97.6 F (36.4 C)   TempSrc:   Oral   SpO2: 100% 99% 100% 95%  Weight:   123.3 kg (271 lb 14.4 oz)   Height:        Intake/Output Summary (Last 24 hours) at 07/16/17 1052 Last data filed at 07/16/17 1005  Gross per 24 hour  Intake              684 ml  Output             1610 ml  Net             -926 ml   Filed Weights   07/15/17 1136 07/15/17 1826 07/16/17 0506  Weight: 122.9 kg (271 lb) 124.2 kg (273 lb 12.8 oz) 123.3 kg (271 lb 14.4 oz)    Examination:  General exam: Appears calm and comfortable  Respiratory system: poor air movement, no wheezes, faint basilra crackles Cardiovascular system: S1 & S2 heard, RRR. Gastrointestinal system: Abdomen is nondistended, soft and nontender.  Normal bowel sounds heard. Central nervous system: Alert and oriented. No focal neurological deficits. Extremities: 1-2plus edema Skin: No rashes, lesions or ulcers Psychiatry: Judgement and insight appear normal. Mood & affect appropriate.     Data Reviewed:   CBC:  Recent Labs Lab 07/15/17 1106  WBC 6.3  HGB 11.7*  HCT 36.0*  MCV 86.3  PLT 297   Basic Metabolic Panel:  Recent Labs Lab 07/15/17 1106 07/16/17 0444  NA 138 138  K 3.0* 3.6  CL 102  101  CO2 29 31  GLUCOSE 257* 257*  BUN 11 9  CREATININE 1.32* 1.11  CALCIUM 8.6* 8.3*  MG 1.6* 1.7   GFR: Estimated Creatinine Clearance: 73.5 mL/min (by C-G formula based on SCr of 1.11 mg/dL). Liver Function Tests: No results for input(s): AST, ALT, ALKPHOS, BILITOT, PROT, ALBUMIN in the last 168 hours. No results for input(s): LIPASE, AMYLASE in the last 168 hours. No results for input(s): AMMONIA in the last 168 hours. Coagulation Profile: No results for input(s): INR, PROTIME in the last 168 hours. Cardiac Enzymes:  Recent Labs Lab 07/15/17 1730 07/15/17 2308  TROPONINI <0.03 <0.03   BNP (last 3 results) No results for  input(s): PROBNP in the last 8760 hours. HbA1C: No results for input(s): HGBA1C in the last 72 hours. CBG:  Recent Labs Lab 07/15/17 2027 07/15/17 2234 07/16/17 0007 07/16/17 0238 07/16/17 0725  GLUCAP 370* 390* 343* 303* 279*   Lipid Profile: No results for input(s): CHOL, HDL, LDLCALC, TRIG, CHOLHDL, LDLDIRECT in the last 72 hours. Thyroid Function Tests: No results for input(s): TSH, T4TOTAL, FREET4, T3FREE, THYROIDAB in the last 72 hours. Anemia Panel: No results for input(s): VITAMINB12, FOLATE, FERRITIN, TIBC, IRON, RETICCTPCT in the last 72 hours. Urine analysis:    Component Value Date/Time   COLORURINE YELLOW 11/08/2016 [place] 11/08/2016 0955   LABSPEC 1.013 11/08/2016 0955   PHURINE 6.0 11/08/2016 0955   GLUCOSEU 50 (A) 11/08/2016 0955   HGBUR NEGATIVE 11/08/2016 0955   BILIRUBINUR neg 01/02/2017 0959   KETONESUR NEGATIVE 11/08/2016 0955   PROTEINUR small 01/02/2017 0959   PROTEINUR NEGATIVE 11/08/2016 0955   UROBILINOGEN negative 01/02/2017 0959   NITRITE neg 01/02/2017 0959   NITRITE NEGATIVE 11/08/2016 0955   LEUKOCYTESUR Negative 01/02/2017 0959   Sepsis Labs: @LABRCNTIP (procalcitonin:4,lacticidven:4)  )No results found for this or any previous visit (from the past 240 hour(s)).       Radiology Studies: Dg Chest 2 View  Result Date: 07/15/2017 CLINICAL DATA:  Chest pain. EXAM: CHEST  2 VIEW COMPARISON:  June 07, 2017 FINDINGS: Cardiomegaly. The hila and mediastinum are normal. Mild opacity in the bases may have an atelectatic component. However, developing infiltrate, particularly in the left base is not completely excluded. Recommend clinical correlation and follow-up as clinically warranted. No edema. IMPRESSION: Mild opacity in the bases, left greater than right could represent atelectasis or developing infiltrate. Recommend follow-up to resolution. Electronically Signed   By: Dorise Bullion III M.D   On: 07/15/2017 13:09   Dg  Shoulder Left  Result Date: 07/15/2017 CLINICAL DATA:  Pain. EXAM: LEFT SHOULDER - 2+ VIEW COMPARISON:  February 06, 2017 FINDINGS: Extensive remodeling of the humeral head with loss of joint space and osteophytosis, unchanged since April 2018, consistent with a chronic osteoarthritic process. No fracture or dislocation. No other acute abnormalities. IMPRESSION: Severe degenerative changes.  No fracture or dislocation. Electronically Signed   By: Dorise Bullion III M.D   On: 07/15/2017 13:07        Scheduled Meds: . albuterol  2.5 mg Nebulization QID  . amLODipine  5 mg Oral Daily  . aspirin EC  81 mg Oral Daily  . clopidogrel  75 mg Oral Daily  . doxycycline  100 mg Oral Q12H  . enoxaparin (LOVENOX) injection  30 mg Subcutaneous Q24H  . furosemide  40 mg Oral BID  . gabapentin  300 mg Oral BID  . insulin aspart  0-15 Units Subcutaneous TID WC  . insulin aspart  protamine- aspart  40 Units Subcutaneous Q supper  . insulin aspart protamine- aspart  50 Units Subcutaneous Q breakfast  . potassium chloride SA  30 mEq Oral Daily  . propranolol  40 mg Oral TID  . saccharomyces boulardii  250 mg Oral BID  . tamsulosin  0.4 mg Oral QHS   Continuous Infusions:   LOS: 1 day    Time spent: 79min    Domenic Polite, MD [facility] Pager [phone]  If 7PM-7AM, please contact night-coverage [email or web address] Password [facility] 07/16/2017, 10:52 AM

## 2017-07-16 NOTE — Progress Notes (Signed)
Pr complain having chest pain and 03:34pm, no nitro in MAR, paged MD  Vitals taken, stat EKG done, waiting for the response

## 2017-07-16 NOTE — Progress Notes (Signed)
Patient refused bed alarm. Will continue to monitor patient. 

## 2017-07-17 ENCOUNTER — Telehealth: Payer: Self-pay | Admitting: Internal Medicine

## 2017-07-17 ENCOUNTER — Ambulatory Visit: Payer: Medicare Other | Admitting: Family Medicine

## 2017-07-17 ENCOUNTER — Telehealth: Payer: Self-pay | Admitting: Family Medicine

## 2017-07-17 ENCOUNTER — Other Ambulatory Visit: Payer: Self-pay | Admitting: Student

## 2017-07-17 DIAGNOSIS — Z794 Long term (current) use of insulin: Secondary | ICD-10-CM

## 2017-07-17 DIAGNOSIS — E1159 Type 2 diabetes mellitus with other circulatory complications: Secondary | ICD-10-CM

## 2017-07-17 DIAGNOSIS — R0789 Other chest pain: Principal | ICD-10-CM

## 2017-07-17 DIAGNOSIS — I25118 Atherosclerotic heart disease of native coronary artery with other forms of angina pectoris: Secondary | ICD-10-CM

## 2017-07-17 DIAGNOSIS — R079 Chest pain, unspecified: Secondary | ICD-10-CM

## 2017-07-17 LAB — BASIC METABOLIC PANEL
ANION GAP: 6 (ref 5–15)
BUN: 12 mg/dL (ref 6–20)
CALCIUM: 8.6 mg/dL — AB (ref 8.9–10.3)
CO2: 30 mmol/L (ref 22–32)
CREATININE: 1.2 mg/dL (ref 0.61–1.24)
Chloride: 101 mmol/L (ref 101–111)
GFR, EST NON AFRICAN AMERICAN: 57 mL/min — AB (ref >60)
Glucose, Bld: 253 mg/dL — ABNORMAL HIGH (ref 65–99)
Potassium: 3.8 mmol/L (ref 3.5–5.1)
SODIUM: 137 mmol/L (ref 135–145)

## 2017-07-17 LAB — CBC
HCT: 37.2 % — ABNORMAL LOW (ref 39.0–52.0)
Hemoglobin: 12.3 g/dL — ABNORMAL LOW (ref 13.0–17.0)
MCH: 28.4 pg (ref 26.0–34.0)
MCHC: 33.1 g/dL (ref 30.0–36.0)
MCV: 85.9 fL (ref 78.0–100.0)
PLATELETS: 193 10*3/uL (ref 150–400)
RBC: 4.33 MIL/uL (ref 4.22–5.81)
RDW: 13.3 % (ref 11.5–15.5)
WBC: 5.6 10*3/uL (ref 4.0–10.5)

## 2017-07-17 LAB — GLUCOSE, CAPILLARY
Glucose-Capillary: 302 mg/dL — ABNORMAL HIGH (ref 65–99)
Glucose-Capillary: 353 mg/dL — ABNORMAL HIGH (ref 65–99)

## 2017-07-17 MED ORDER — NITROGLYCERIN 0.4 MG SL SUBL: 0.4000 mg | SUBLINGUAL_TABLET | SUBLINGUAL | 0 refills | Status: DC | PRN

## 2017-07-17 MED ORDER — INSULIN NPH ISOPHANE & REGULAR (70-30) 100 UNIT/ML ~~LOC~~ SUSP: 20.0000 [IU] | Freq: Three times a day (TID) | SUBCUTANEOUS | 0 refills | Status: DC

## 2017-07-17 MED ORDER — ISOSORBIDE MONONITRATE ER 30 MG PO TB24: 15.0000 mg | ORAL_TABLET | Freq: Every day | ORAL | Status: DC

## 2017-07-17 MED ORDER — ISOSORBIDE MONONITRATE ER 30 MG PO TB24: 15.0000 mg | ORAL_TABLET | Freq: Every day | ORAL | 0 refills | Status: DC

## 2017-07-17 MED ORDER — ENOXAPARIN SODIUM 60 MG/0.6ML ~~LOC~~ SOLN: 0.5000 mg/kg | SUBCUTANEOUS | Status: DC

## 2017-07-17 NOTE — Discharge Instructions (Signed)

## 2017-07-17 NOTE — Consult Note (Signed)
[facility] CM Inpatient Consult   07/17/2017  Edward [name] 09/26/1941 [id number]   Patient was assessed for [facility] Care Management services in the Medicare ACO with 3 hospitalizations in the past 6 months. Patient is a 76 year old with a HX of CAD, CABG in 1/18, Diastolic HF and admitted with unrelieved chest pain.  Met with the patient at bedside.  Patient endorses Dr. Yvonne Lowne Chase at [facility] as his primary care provider. This office is listed to provide the transition of care calls and follow up appointment.   He verbalizes good follow up and denies any needs for transportation, or pharmacy needs.  He weighs and monitors his symptoms. He would like the telephone follow up calls and was given a brochure with the 24 hour nurse advise line information and contact information.  For questions, please contact:   , RN BSN CCM [facility] Liaison  [phone] business mobile phone Toll free office [phone]    

## 2017-07-17 NOTE — Telephone Encounter (Signed)
Patient needs hospital follow up appointment.  

## 2017-07-17 NOTE — Telephone Encounter (Signed)
When would you like to see him?

## 2017-07-17 NOTE — Progress Notes (Signed)
Met with patient this afternoon.  Patient stated to me he is taking Novolog 40 units TID with meals and 70/30 Insulin 60 units at bedtime (does not give 70/30 insulin at bedtime if CBG <200 mg/dl).  Per patient, he is not having any Hypoglycemic events at home.  Discussed with patient that his Endocrinologist's notes (Dr. Cruzita Lederer) from 05/16/17 state that he was instructed to stop his Levemir and Novolog and that he was instructed to take 70/30 Insulin: 40 units in the AM with Breakfast/ 20 units with Lunch/ 60 units with Dinner.  Patient seemed slightly confused by this and stated he thought he was still supposed to be taking the Novolog as well.  Showed patient the note from Dr. Cruzita Lederer on the computer.  Discussed the above with Dr. Algis Liming.  After discussion, decision made to have patient stop his Novolog and start taking his 70/30 Insulin as follows: 30 units with Breakfast 20 units with Lunch 40 units with Dinner.  Discussed the above with the patient.  Instructed him to stop his Novolog for now and to take his 70/30 insulin as Dr. Algis Liming has instructed.  Patient stated understanding and plans to keep detailed CBG log until he sees Dr. Cruzita Lederer in 2 weeks.     --Will follow patient during hospitalization--  Wyn Quaker RN, MSN, CDE Diabetes Coordinator Inpatient Glycemic Control Team Team Pager: [phone] (8a-5p)

## 2017-07-17 NOTE — Discharge Summary (Signed)
Pt got discharged to home, discharge instructions provided and patient showed understanding to it, IV taken out,Telemonitor DC,pt left unit in wheelchair with all of the belongings.before discharge tylenol provided for little headache, otherwise patient left hospital in a stable condition.

## 2017-07-17 NOTE — Discharge Summary (Signed)
Physician Discharge Summary  Edward [name] UXL:[id number] DOB: 10-Dec-1940  PCP: Ann Held, DO  Admit date: 07/15/2017 Discharge date: 07/17/2017  Recommendations for Outpatient Follow-up:  1. Dr. Roma Schanz, PCP in 4 days with repeat labs (CBC & BMP). 2. Recommend repeating chest x-ray in 3-4 weeks to ensure resolution of abnormal finding/atelectasis. 3. Dr. Benjiman Core, Endocrinology in 1 week. 4. Dr. Daneen Schick, Cardiology: Office will arrange follow-up.  Home Health: None Equipment/Devices: None    Discharge Condition: Improved and stable  CODE STATUS: Full  Diet recommendation: Heart healthy and diabetic diet.  Discharge Diagnoses:  Principal Problem:   Chest pain Active Problems:   Essential hypertension   Severe obesity (BMI >= 40) (HCC)   Acute bronchitis   Acute on chronic diastolic heart failure (HCC)   Chronic kidney disease, stage III (moderate)   Coronary artery disease involving coronary bypass graft of native heart without angina pectoris   Coronary artery disease   Atelectasis   Brief Summary: 76 year old male with PMH of CAD, CABG in January 2018, HTN, chronic diastolic CHF, stage II chronic kidney disease, poorly controlled DM 2/IDDM, HLD, essential tremor on propranolol, GERD, OSA not on C Pap, TIA, morbid obesity presented to the ED on 07/15/17 with 2 days history of left sided chest/left shoulder and arm pain, one week history of chills, dry cough but no fevers, wheezing and no sick contacts with similar symptoms. His chest pain was unlike the pain that he had prior to his CABG. He was admitted for further evaluation. Cardiology was consulted.  Assessment and plan:  1. Chest pain/left arm pain: Cardiology consultation and follow-up appreciated. Ruled out for MI. Still having some intermittent dyspnea and concern for angina equivalent. As per cardiology, given history of CABG, reasonable to pursue outpatient NST and they will arrange  follow-up. Continue aspirin, Plavix, propranolol (on for possible essential tremors) and Imdur added. Discussed with cardiology and have cleared him for discharge home. Although patient reports chills and nonproductive cough, he has no fevers or leukocytosis and chest x-ray 9/16 shows slightly improved bibasilar radiation with subsegmental atelectasis remaining. This is likely due to atelectasis rather than pneumonia. Hence discontinued empirically started doxycycline. Left shoulder x-ray shows severe degenerative changes which could be a contributing factor to his shoulder pain. 2. 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 3. Acute on chronic diastolic CHF: Patient received additional doses of IV Lasix 40 mg on 9/15 and 9/16. -1.3 L since admission. As per cardiology, continue home dose of Lasix 40 mg twice a day at discharge and Imdur 15 mg daily added. Improved. 4. Essential hypertension: Controlled. Continue amlodipine, propranolol and Imdur added. 5. Hyperlipidemia: Lipid panel 4/18 showed LDL 107, not at goal <70 with known CAD. Reports being intolerant to statins secondary to muscle cramping. As per cardiology, consider outpatient referral to lipid clinic. 6. OSA: Reports that he sees Dr. Lake Bells, Pulmonology and has not needed outpatient CPAP. 7. DM type 2/IDDM: Poorly controlled as evidenced by A1c of 9.8 on 06/06/17. As per his Endocrinologist Dr. Cruzita Lederer office visit on 05/16/17, patient was supposed to stop Levemir and NovoLog and transition to Novolin 70/30, 30-40 units before breakfast, 20 units before lunch and 60 units before dinner and follow up with her in 1.5 months. Home medications list NovoLog and 70/30 insulin. Discussed in detail with diabetes coordinator and will transition him to 70/30 at slightly lower than doses recommended by his endocrinologist until he follows up  with her in 1 week. Patient advised to keep CBG logs. Diabetes coordinator to meet with patient to go  over these changes. 8. Hypokalemia/hypomagnesemia: Replaced. 9. Essential tremor: Continue propranolol and outpatient follow-up with neurology. 10. Morbid obesity/Body mass index is 41.39 kg/m. Needs to lose weight. 11. Stage II chronic kidney disease: Stable.   Consultations:  Cardiology  Procedures:  None   Discharge Instructions  Discharge Instructions    (HEART FAILURE PATIENTS) Call MD:  Anytime you have any of the following symptoms: 1) 3 pound weight gain in 24 hours or 5 pounds in 1 week 2) shortness of breath, with or without a dry hacking cough 3) swelling in the hands, feet or stomach 4) if you have to sleep on extra pillows at night in order to breathe.    Complete by:  As directed    Call MD for:  difficulty breathing, headache or visual disturbances    Complete by:  As directed    Call MD for:  extreme fatigue    Complete by:  As directed    Call MD for:  persistant dizziness or light-headedness    Complete by:  As directed    Call MD for:  severe uncontrolled pain    Complete by:  As directed    Diet - low sodium heart healthy    Complete by:  As directed    Diet Carb Modified    Complete by:  As directed    Discharge instructions    Complete by:  As directed    Acetaminophen/Tylenol from all sources not to exceed greater than 4 g per day.   Increase activity slowly    Complete by:  As directed        Medication List    STOP taking these medications   insulin aspart 100 UNIT/ML injection Commonly known as:  novoLOG     TAKE these medications   acetaminophen 500 MG tablet Commonly known as:  TYLENOL Take 2 tablets (1,000 mg total) by mouth every 6 (six) hours as needed. What changed:  reasons to take this   amLODipine 5 MG tablet Commonly known as:  NORVASC Take 1 tablet (5 mg total) by mouth daily.   aspirin EC 81 MG tablet Take 81 mg by mouth daily.   butalbital-acetaminophen-caffeine 50-325-40 MG tablet Commonly known as:  FIORICET,  ESGIC Take 1 tablet by mouth every 6 (six) hours as needed for headache.   clonazePAM 0.5 MG tablet Commonly known as:  KLONOPIN Take 1 tablet (0.5 mg total) by mouth 2 (two) times daily as needed (tremors).   clopidogrel 75 MG tablet Commonly known as:  PLAVIX Take 1 tablet (75 mg total) by mouth daily.   diclofenac sodium 1 % Gel Commonly known as:  VOLTAREN Apply 2 g topically 4 (four) times daily as needed (for pain).   furosemide 40 MG tablet Commonly known as:  LASIX Take 1 tablet (40 mg total) by mouth 2 (two) times daily.   gabapentin 300 MG capsule Commonly known as:  NEURONTIN Take 1 capsule (300 mg total) by mouth 2 (two) times daily.   insulin NPH-regular Human (70-30) 100 UNIT/ML injection Commonly known as:  NOVOLIN 70/30 Inject 20-40 Units into the skin 3 (three) times daily with meals. 30 units with Breakfast, 20 units with Lunch, 40 units with Dinner. What changed:  how much to take  when to take this  additional instructions   isosorbide mononitrate 30 MG 24 hr tablet Commonly known as:  IMDUR Take 0.5 tablets (15 mg total) by mouth daily.   meclizine 25 MG tablet Commonly known as:  ANTIVERT Take 1 tablet (25 mg total) by mouth 2 (two) times daily as needed for dizziness.   nitroGLYCERIN 0.4 MG SL tablet Commonly known as:  NITROSTAT Place 1 tablet (0.4 mg total) under the tongue every 5 (five) minutes as needed for chest pain. Max: 3 doses per episode.   potassium chloride SA 20 MEQ tablet Commonly known as:  K-DUR,KLOR-CON Take 1.5 tablets (30 mEq total) by mouth daily.   propranolol 40 MG tablet Commonly known as:  INDERAL Take 40 mg by mouth 3 (three) times daily.   psyllium 58.6 % packet Commonly known as:  METAMUCIL Take 1 packet by mouth daily as needed (constipation).   REFRESH OP Place 1 drop into both eyes 3 (three) times daily as needed (for dry eyes). Reported on 12/25/2015   tamsulosin 0.4 MG Caps capsule Commonly known as:   FLOMAX Take 0.4 mg by mouth at bedtime.   VENTOLIN HFA 108 (90 Base) MCG/ACT inhaler Generic drug:  albuterol inhale 2 puffs by mouth every 6 hours if needed for wheezing or shortness of breath      Follow-up Information    Roma Schanz R, DO. Schedule an appointment as soon as possible for a visit in 4 day(s).   Specialty:  Family Medicine Why:  To be seen with repeat labs (CBC & BMP). Contact information: [address] [place] [address] [phone]        Philemon Kingdom, MD. Schedule an appointment as soon as possible for a visit in 1 week(s).   Specialty:  Internal Medicine Why:  Follow-up regarding further management of poorly controlled diabetes. Contact information: [address] [place] [address] [phone]        Belva Crome, MD Follow up.   Specialty:  Cardiology Why:  Office will arrange follow-up. Contact information: [address] [place] [address] [phone]          Allergies  Allergen Reactions  . Pneumococcal Vaccines Anaphylaxis      Procedures/Studies: Dg Chest 2 View  Result Date: 07/16/2017 CLINICAL DATA:  Chills, SOB, fever. Patient not able to lift left arm for lateral chest. EXAM: CHEST  2 VIEW COMPARISON:  One day prior FINDINGS: Lateral view degraded by patient arm position. Midline trachea. Normal heart size. Prior median sternotomy. No pleural effusion or pneumothorax. Mildly low low lung volumes. Minimal volume loss and subsegmental atelectasis at both lung bases. Slightly improved aeration. IMPRESSION: Slightly improved bibasilar aeration, with subsegmental atelectasis remaining. Electronically Signed   By: Abigail Miyamoto M.D.   On: 07/16/2017 11:24   Dg Chest 2 View  Result Date: 07/15/2017 CLINICAL DATA:  Chest pain. EXAM: CHEST  2 VIEW COMPARISON:  June 07, 2017 FINDINGS: Cardiomegaly. The hila and mediastinum are normal. Mild opacity in the bases  may have an atelectatic component. However, developing infiltrate, particularly in the left base is not completely excluded. Recommend clinical correlation and follow-up as clinically warranted. No edema. IMPRESSION: Mild opacity in the bases, left greater than right could represent atelectasis or developing infiltrate. Recommend follow-up to resolution. Electronically Signed   By: Dorise Bullion III M.D   On: 07/15/2017 13:09   Dg Shoulder Left  Result Date: 07/15/2017 CLINICAL DATA:  Pain. EXAM: LEFT SHOULDER - 2+ VIEW COMPARISON:  February 06, 2017 FINDINGS: Extensive remodeling of the  humeral head with loss of joint space and osteophytosis, unchanged since April 2018, consistent with a chronic osteoarthritic process. No fracture or dislocation. No other acute abnormalities. IMPRESSION: Severe degenerative changes.  No fracture or dislocation. Electronically Signed   By: Dorise Bullion III M.D   On: 07/15/2017 13:07      Subjective: Seen this morning. No further chest pain. Reports intermittent mild dry cough. Ongoing intermittent mild chills with sweating but no fevers. Denies sick contacts at home with similar complaints. No dysuria, urinary frequency, nausea, vomiting, diarrhea, skin rash or open wounds. Denies insect bites. No wheezing reported. Indicates that his breathing is much improved compared to when he came in. Did have an episode of chest pain yesterday afternoon which resolved after a dose of sublingual nitroglycerin and has not recurred.  Discharge Exam:  Vitals:   07/17/17 0300 07/17/17 0927 07/17/17 0955 07/17/17 1212  BP: 113/86 110/70  128/61  Pulse: 72 70  74  Resp: 18   20  Temp: 98.1 F (36.7 C)   97.9 F (36.6 C)  TempSrc: Oral   Oral  SpO2: 99%  96% 96%  Weight: 123.5 kg (272 lb 3.2 oz)     Height:        General: Pleasant elderly male, moderately built and obese, sitting up comfortably in chair this morning. Cardiovascular: S1 & S2 heard, RRR, S1/S2 +. No  murmurs, rubs, gallops or clicks. No JVD. Trace bilateral ankle edema. Respiratory: Slightly diminished breath sounds in the bases with occasional basal crackles but otherwise clear to auscultation. No wheezing or rhonchi heard. No increased work of breathing. Midline sternotomy scar, healed. Abdominal:  Non distended, non tender & soft. No organomegaly or masses appreciated. Normal bowel sounds heard. CNS: Alert and oriented. No focal deficits. Extremities: no edema, no cyanosis    The results of significant diagnostics from this hospitalization (including imaging, microbiology, ancillary and laboratory) are listed below for reference.      Labs: CBC:  Recent Labs Lab 07/15/17 1106 07/17/17 0452  WBC 6.3 5.6  HGB 11.7* 12.3*  HCT 36.0* 37.2*  MCV 86.3 85.9  PLT 200 470   Basic Metabolic Panel:  Recent Labs Lab 07/15/17 1106 07/16/17 0444 07/17/17 0452  NA 138 138 137  K 3.0* 3.6 3.8  CL 102 101 101  CO2 29 31 30   GLUCOSE 257* 257* 253*  BUN 11 9 12   CREATININE 1.32* 1.11 1.20  CALCIUM 8.6* 8.3* 8.6*  MG 1.6* 1.7  --    BNP (last 3 results)  Recent Labs  10/29/16 1810 06/06/17 1839 07/15/17 1109  BNP 21.4 53.5 26.8   Cardiac Enzymes:  Recent Labs Lab 07/15/17 1730 07/15/17 2308  TROPONINI <0.03 <0.03   CBG:  Recent Labs Lab 07/16/17 1134 07/16/17 1626 07/16/17 2115 07/17/17 0746 07/17/17 1141  GLUCAP 268* 285* 353* 302* 353*   I discussed in detail with rounding cardiologist today. I also discussed in detail with diabetes coordinator.    Time coordinating discharge: Over 30 minutes  SIGNED:  Vernell Leep, MD, FACP, [place]. [facility] Pager [phone]  If 7PM-7AM, please contact night-coverage [email or web address] Password [email or web address] 07/17/2017, 1:57 PM

## 2017-07-17 NOTE — Progress Notes (Signed)
Progress Note  Patient Name: Edward [name] Date of Encounter: 07/17/2017  Primary Cardiologist: Dr. Tamala Julian  Subjective   Had an episode of chest pain yesterday afternoon which he reports lasted for over 2 hours but relieved with SL NTG. Breathing is still not at baseline. No palpitations. Reports having chills.   Inpatient Medications    Scheduled Meds: . albuterol  2.5 mg Nebulization BID  . amLODipine  5 mg Oral Daily  . aspirin EC  81 mg Oral Daily  . clopidogrel  75 mg Oral Daily  . doxycycline  100 mg Oral Q12H  . enoxaparin (LOVENOX) injection  30 mg Subcutaneous Q24H  . furosemide  40 mg Oral BID  . gabapentin  300 mg Oral BID  . insulin aspart  0-15 Units Subcutaneous TID WC  . insulin aspart protamine- aspart  40 Units Subcutaneous Q supper  . insulin aspart protamine- aspart  50 Units Subcutaneous Q breakfast  . polyethylene glycol  17 g Oral Daily  . potassium chloride SA  30 mEq Oral Daily  . propranolol  40 mg Oral TID  . saccharomyces boulardii  250 mg Oral BID  . tamsulosin  0.4 mg Oral QHS   Continuous Infusions:  PRN Meds: acetaminophen, albuterol, clonazePAM, meclizine, nitroGLYCERIN   Vital Signs    Vitals:   07/16/17 1954 07/17/17 0300 07/17/17 0927 07/17/17 0955  BP: 110/69 113/86 110/70   Pulse: 67 72 70   Resp: 18 18    Temp: 98 F (36.7 C) 98.1 F (36.7 C)    TempSrc: Oral Oral    SpO2: 97% 99%  96%  Weight:  272 lb 3.2 oz (123.5 kg)    Height:        Intake/Output Summary (Last 24 hours) at 07/17/17 1141 Last data filed at 07/17/17 0836  Gross per 24 hour  Intake              960 ml  Output             1150 ml  Net             -190 ml   Filed Weights   07/15/17 1826 07/16/17 0506 07/17/17 0300  Weight: 273 lb 12.8 oz (124.2 kg) 271 lb 14.4 oz (123.3 kg) 272 lb 3.2 oz (123.5 kg)    Telemetry    NSR, HR in 60's - 70's.  - Personally Reviewed  ECG    SR with 1st degree AV Block, HR 78. No acute ST or T-wave changes.  -  Personally Reviewed  Physical Exam   General: Well developed, well nourished African American male appearing in no acute distress. Head: Normocephalic, atraumatic.  Neck: Supple without bruits, JVD not elevated. Lungs:  Resp regular and unlabored, CTA without wheezing or rales. Heart: RRR, S1, S2, no S3, S4, or murmur; no rub. Sternal scar noted.  Abdomen: Soft, non-tender, non-distended with normoactive bowel sounds. No hepatomegaly. No rebound/guarding. No obvious abdominal masses. Extremities: No clubbing or cyanosis. Trace lower extremity edema. Distal pedal pulses are 2+ bilaterally. Neuro: Alert and oriented X 3. Moves all extremities spontaneously. Psych: Normal affect.  Labs    Chemistry Recent Labs Lab 07/15/17 1106 07/16/17 0444 07/17/17 0452  NA 138 138 137  K 3.0* 3.6 3.8  CL 102 101 101  CO2 29 31 30   GLUCOSE 257* 257* 253*  BUN 11 9 12   CREATININE 1.32* 1.11 1.20  CALCIUM 8.6* 8.3* 8.6*  GFRNONAA 51* >60 57*  GFRAA 59* >60 >60  ANIONGAP 7 6 6      Hematology Recent Labs Lab 07/15/17 1106 07/17/17 0452  WBC 6.3 5.6  RBC 4.17* 4.33  HGB 11.7* 12.3*  HCT 36.0* 37.2*  MCV 86.3 85.9  MCH 28.1 28.4  MCHC 32.5 33.1  RDW 13.5 13.3  PLT 200 193    Cardiac Enzymes Recent Labs Lab 07/15/17 1730 07/15/17 2308  TROPONINI <0.03 <0.03    Recent Labs Lab 07/15/17 1139  TROPIPOC 0.00     BNP Recent Labs Lab 07/15/17 1109  BNP 26.8     DDimer No results for input(s): DDIMER in the last 168 hours.   Radiology    Dg Chest 2 View  Result Date: 07/16/2017 CLINICAL DATA:  Chills, SOB, fever. Patient not able to lift left arm for lateral chest. EXAM: CHEST  2 VIEW COMPARISON:  One day prior FINDINGS: Lateral view degraded by patient arm position. Midline trachea. Normal heart size. Prior median sternotomy. No pleural effusion or pneumothorax. Mildly low low lung volumes. Minimal volume loss and subsegmental atelectasis at both lung bases. Slightly  improved aeration. IMPRESSION: Slightly improved bibasilar aeration, with subsegmental atelectasis remaining. Electronically Signed   By: Abigail Miyamoto M.D.   On: 07/16/2017 11:24   Dg Chest 2 View  Result Date: 07/15/2017 CLINICAL DATA:  Chest pain. EXAM: CHEST  2 VIEW COMPARISON:  June 07, 2017 FINDINGS: Cardiomegaly. The hila and mediastinum are normal. Mild opacity in the bases may have an atelectatic component. However, developing infiltrate, particularly in the left base is not completely excluded. Recommend clinical correlation and follow-up as clinically warranted. No edema. IMPRESSION: Mild opacity in the bases, left greater than right could represent atelectasis or developing infiltrate. Recommend follow-up to resolution. Electronically Signed   By: Dorise Bullion III M.D   On: 07/15/2017 13:09   Dg Shoulder Left  Result Date: 07/15/2017 CLINICAL DATA:  Pain. EXAM: LEFT SHOULDER - 2+ VIEW COMPARISON:  February 06, 2017 FINDINGS: Extensive remodeling of the humeral head with loss of joint space and osteophytosis, unchanged since April 2018, consistent with a chronic osteoarthritic process. No fracture or dislocation. No other acute abnormalities. IMPRESSION: Severe degenerative changes.  No fracture or dislocation. Electronically Signed   By: Dorise Bullion III M.D   On: 07/15/2017 13:07    Cardiac Studies   Cardiac Catheterization: 10/2016 Conclusions: 1. Significant LMCA (60%) and ostial LCx (85%) stenoses.  Eccentric mid LMCA stenosis appears somewhat worse than prior angiogram in 12/2015.  Lesion was borderline significant at that time by IVUS. 2. Patent mid LAD stent with mild to moderate proximal LAD, LCx/OM, and RCA disease. 3. Mildly elevated left ventricular filling pressure.   Recommendations: 1. Cardiac surgery consultation for CABG, given progression of LMCA disease. 2. Discontinue clopidogrel in anticipation of possible CABG. 3. Restart heparin infusion 4 hours after TR band  removal. 4. Continue aggressive secondary prevention.  Findings and recommendations were discussed with Dr. Tamala Julian.  Echocardiogram: 06/08/2017 Study Conclusions  - Left ventricle: The cavity size was normal. There was severe   concentric hypertrophy. Systolic function was normal. The   estimated ejection fraction was in the range of 60% to 65%. Wall   motion was normal; there were no regional wall motion   abnormalities. Doppler parameters are consistent with abnormal   left ventricular relaxation (grade 1 diastolic dysfunction).   There was no evidence of elevated ventricular filling pressure by   Doppler parameters. - Aortic valve: Trileaflet; normal thickness leaflets. There  was no   regurgitation. - Aortic root: The aortic root was normal in size. - Mitral valve: There was no regurgitation. - Left atrium: The atrium was mildly dilated. - Right ventricle: Systolic function was normal. - Tricuspid valve: There was mild regurgitation. - Pulmonary arteries: Systolic pressure was within the normal   range. - Inferior vena cava: The vessel was not visualized. - Pericardium, extracardiac: There was no pericardial effusion.  Patient Profile     76 y.o. male w/ PMH of CAD (s/p DES to LAD in 2016, CABG in 10/2016 with LIMA-LAD and SVG-OM1), HTN, HLD, IDDM, and Stage 3 CKD who presented to [facility] ED on 07/15/2017 for evaluation of chest pain.   Assessment & Plan    1. Chest Pain with Mixed Typical and Atypical Features - presented for evaluation of chest pain and left arm pain. Cyclic troponin values have been negative and EKG is without acute ischemic changes. Initial presenting symptoms have been thought to possibly be secondary to MSK cramping in the setting of hypokalemia on admission.   - he had a repeat episode of chest pain yesterday afternoon which lasted for over 2 hours and overall seems atypical for a cardiac etiology but he reports worsening fatigue and does not feel  like his breathing is at baseline. I have asked the patient to ambulate down the hallway this afternoon. Can consider [employer] tomorrow. Will discuss further with Dr. Debara Pickett. If no further ischemic evaluation is pursued, recommend initiation of Imdur for medical therapy.   2. CAD - prior intervention to the LAD in 2016. Recently underwent CABG in 10/2016 with LIMA-LAD and SVG-OM1.  - continue ASA, Plavix, and BB therapy. Intolerant to statin therapy.   3. Acute on Chronic Diastolic CHF - CXR on admission showed mild bilateral opacities with slight improvement noted on repeat CXR. BNP normal at 26 on admission.  - he has remained on PTA Lasix dosing of 40mg  BID along with receiving an additional IV Lasix dose of 40mg  on 9/15 and 9/16 with an overall net output of -1.3L this admission. He does not appear volume overloaded by physical examination. Continue PTA Lasix dosing.   4. HTN - BP has been well-controlled at 110/52 - 132/86 within the past 24 hours.  - continue Amlodipine 5mg  daily and Propranolol 40mg  TID.   5. HLD - Lipid Panel in 01/2017 showed total cholesterol of 166, HDL 30, and LDL 107. Not at goal of < 31 with known CAD. Reports being intolerant to statins secondary to muscle cramping. Consider referral to the [facility]id Clinic in the outpatient setting.   6. OSA - continue CPAP.   7. IDDM - per admitting team.     Signed, Erma Heritage , PA-C 11:41 AM 07/17/2017 Pager: [phone]

## 2017-07-17 NOTE — Progress Notes (Signed)
Inpatient Diabetes Program Recommendations  AACE/ADA: New Consensus Statement on Inpatient Glycemic Control (2015)  Target Ranges:  Prepandial:   less than 140 mg/dL      Peak postprandial:   less than 180 mg/dL (1-2 hours)      Critically ill patients:  140 - 180 mg/dL   Results for Edward [name], Edward [name] (MRN [id number]) as of 07/17/2017 11:44  Ref. Range 07/16/2017 07:25 07/16/2017 11:34 07/16/2017 16:26 07/16/2017 21:15  Glucose-Capillary Latest Ref Range: 65 - 99 mg/dL 279 (H) 268 (H) 285 (H) 353 (H)   Results for Edward [name], Edward [name] (MRN [id number]) as of 07/17/2017 11:44  Ref. Range 07/17/2017 07:46 07/17/2017 11:41  Glucose-Capillary Latest Ref Range: 65 - 99 mg/dL 302 (H) 353 (H)    Admit with: CP/ SOB  History: DM, CKD  Home DM Meds: Novolin 70/30 Insulin- 40 units in the AM with Breakfast/ 20 units with Lunch/ 60 units with Dinner (see Dr. Arman Filter office visit notes from 05/16/17)  Current Insulin Orders: 70/30 Insulin- 50 units AM/ 40 units PM      Novolog Moderate Correction Scale/ SSI (0-15 units) TID AC       MD- Note patient saw his Endocrinologist (Dr. Cruzita Lederer with [facility]) on 05/16/17.  At that visit, patient was instructed to stop taking Levemir and Novolog and to start taking 70/30 Insulin TID with meals:  40 units in the AM with Breakfast/ 20 units with Lunch/ 60 units with Dinner  Patient eating 100% of meals here in hospital.  Please consider the following:  Change 70/30 Insulin to home doses per pt's Endocrinologist's recs from 05/16/17:  40 units with Breakfast 20 units with Lunch 60 units with Dinner    --Will follow patient during hospitalization--  Wyn Quaker RN, MSN, CDE Diabetes Coordinator Inpatient Glycemic Control Team Team Pager: [phone] (8a-5p)

## 2017-07-17 NOTE — Telephone Encounter (Signed)
Pt admitted at the hospital -Do Not charge a no show fee per Dr Etter Sjogren.

## 2017-07-18 ENCOUNTER — Telehealth: Payer: Self-pay

## 2017-07-18 NOTE — Telephone Encounter (Signed)
Attempted to contact patient to see if he could come today at 4. LVM, and left call back number.

## 2017-07-18 NOTE — Telephone Encounter (Signed)
Called patient and LVM advising to come today at 4 if he could.  Gave call back number.

## 2017-07-18 NOTE — Telephone Encounter (Signed)
Can he come today at 4:15?

## 2017-07-19 ENCOUNTER — Other Ambulatory Visit (HOSPITAL_COMMUNITY): Payer: Self-pay | Admitting: Student

## 2017-07-19 ENCOUNTER — Telehealth: Payer: Self-pay | Admitting: Behavioral Health

## 2017-07-19 NOTE — Telephone Encounter (Signed)
Transition Care Management Follow-up Telephone Call  PCP: Ann Held, DO  Admit date: 07/15/2017 Discharge date: 07/17/2017  Recommendations for Outpatient Follow-up:  1. Dr. Roma Schanz, PCP in 4 days with repeat labs (CBC & BMP). 2. Recommend repeating chest x-ray in 3-4 weeks to ensure resolution of abnormal finding/atelectasis. 3. Dr. Benjiman Core, Endocrinology in 1 week. 4. Dr. Daneen Schick, Cardiology: Office will arrange follow-up.  Home Health: None Equipment/Devices: None    Discharge Condition: Improved and stable    How have you been since you were released from the hospital? Patient voiced that he's getting his energy back & breathing is much better.   Do you understand why you were in the hospital? yes   Do you understand the discharge instructions? yes   Where were you discharged to? Home   Items Reviewed:  Medications reviewed: yes  Allergies reviewed: yes  Dietary changes reviewed: yes, heart healthy diet  Referrals reviewed: yes, Dr. Roma Schanz, PCP in 4 days with repeat labs (CBC & BMP); Dr. Benjiman Core, Endocrinology in 1 week; Dr. Daneen Schick, Cardiology: Office will arrange follow-up.   Functional Questionnaire:   Activities of Daily Living (ADLs):   He states they are independent in the following: ambulation, bathing and hygiene, feeding, continence, grooming, toileting and dressing States they require assistance with the following: None   Any transportation issues/concerns?: yes, patient's brother transports him to his appointments.   Any patient concerns? no   Confirmed importance and date/time of follow-up visits scheduled no, patient voiced that he will have to follow-up with his brother regarding dates & times that he's available to bring him to the office for an appointment.  Confirmed with patient if condition begins to worsen call PCP or go to the ER.  Patient was given the office number and  encouraged to call back with question or concerns.  : yes

## 2017-07-19 NOTE — Telephone Encounter (Signed)
Returned pt call. Advise pt that Dr.Smith has reviewed his recent hospital stay and does not feel that a myoview is necessary at this time. Patient is to call the office if symptoms persist. Patient should keep the upcoming appt scheduled on 10/1 with Cecille Rubin G.,NP. Myoview cancelled, Pt appreciative for the call back and verbalized understanding.

## 2017-07-19 NOTE — Telephone Encounter (Signed)
TCM/Hospital Follow-up appointment has been scheduled for 07/24/17 at 11:30 AM with Dr. Carollee Herter.

## 2017-07-19 NOTE — Telephone Encounter (Signed)
New Message:  I spoke with patient to schedule him for a myoview appt and he stated that the last time he had one done, the doctor called in a prescription for an anti-anxiety medicine to assist with his claustrophobia. Please f/u with patient.  Thanks

## 2017-07-19 NOTE — Telephone Encounter (Signed)
Patient voiced that he will call the office back later to complete TCM/Hospital Follow-up questions.

## 2017-07-21 ENCOUNTER — Other Ambulatory Visit: Payer: Medicare Other

## 2017-07-22 ENCOUNTER — Encounter: Payer: Self-pay | Admitting: Internal Medicine

## 2017-07-22 NOTE — Progress Notes (Signed)
I was called by the patient complaining that his breathing was getting worse,wheezing,having wheezing,cough and chills.he said his symptoms got better while he was on antibiotics.his pulmonology appointment is not for another 10 days.he came to the hospital to see me next day.on my exam he had wheezing b/L.i prescribed him doxycycline for bronchitis.and asked himto follow up with pcp and pulmonary.

## 2017-07-24 ENCOUNTER — Ambulatory Visit (INDEPENDENT_AMBULATORY_CARE_PROVIDER_SITE_OTHER): Payer: Medicare Other | Admitting: Family Medicine

## 2017-07-24 ENCOUNTER — Encounter: Payer: Self-pay | Admitting: Family Medicine

## 2017-07-24 VITALS — BP 144/76 | HR 86 | Resp 20 | Wt 275.0 lb

## 2017-07-24 DIAGNOSIS — J9801 Acute bronchospasm: Secondary | ICD-10-CM

## 2017-07-24 DIAGNOSIS — F419 Anxiety disorder, unspecified: Secondary | ICD-10-CM

## 2017-07-24 DIAGNOSIS — G250 Essential tremor: Secondary | ICD-10-CM | POA: Diagnosis not present

## 2017-07-24 DIAGNOSIS — Z1159 Encounter for screening for other viral diseases: Secondary | ICD-10-CM

## 2017-07-24 DIAGNOSIS — E1159 Type 2 diabetes mellitus with other circulatory complications: Secondary | ICD-10-CM

## 2017-07-24 DIAGNOSIS — G4733 Obstructive sleep apnea (adult) (pediatric): Secondary | ICD-10-CM | POA: Diagnosis not present

## 2017-07-24 DIAGNOSIS — I10 Essential (primary) hypertension: Secondary | ICD-10-CM

## 2017-07-24 DIAGNOSIS — E785 Hyperlipidemia, unspecified: Secondary | ICD-10-CM

## 2017-07-24 DIAGNOSIS — R079 Chest pain, unspecified: Secondary | ICD-10-CM

## 2017-07-24 DIAGNOSIS — Z794 Long term (current) use of insulin: Secondary | ICD-10-CM

## 2017-07-24 LAB — COMPREHENSIVE METABOLIC PANEL
ALBUMIN: 3.6 g/dL (ref 3.5–5.2)
ALK PHOS: 105 U/L (ref 39–117)
ALT: 14 U/L (ref 0–53)
AST: 14 U/L (ref 0–37)
BILIRUBIN TOTAL: 0.4 mg/dL (ref 0.2–1.2)
BUN: 12 mg/dL (ref 6–23)
CALCIUM: 8.8 mg/dL (ref 8.4–10.5)
CHLORIDE: 103 meq/L (ref 96–112)
CO2: 31 mEq/L (ref 19–32)
CREATININE: 1.11 mg/dL (ref 0.40–1.50)
GFR: 83.02 mL/min (ref >60.00)
Glucose, Bld: 286 mg/dL — ABNORMAL HIGH (ref 70–99)
Potassium: 3.8 mEq/L (ref 3.5–5.1)
SODIUM: 142 meq/L (ref 135–145)
TOTAL PROTEIN: 6.6 g/dL (ref 6.0–8.3)

## 2017-07-24 LAB — LIPID PANEL
Cholesterol: 191 mg/dL (ref 0–200)
HDL: 40.3 mg/dL (ref >39.00)
LDL CALC: 120 mg/dL — AB (ref 0–99)
NONHDL: 151.14
Total CHOL/HDL Ratio: 5
Triglycerides: 157 mg/dL — ABNORMAL HIGH (ref 0.0–149.0)
VLDL: 31.4 mg/dL (ref 0.0–40.0)

## 2017-07-24 LAB — CBC WITH DIFFERENTIAL/PLATELET
BASOS PCT: 0.4 % (ref 0.0–3.0)
Basophils Absolute: 0 10*3/uL (ref 0.0–0.1)
EOS ABS: 0.2 10*3/uL (ref 0.0–0.7)
Eosinophils Relative: 3.4 % (ref 0.0–5.0)
HEMATOCRIT: 40.6 % (ref 39.0–52.0)
HEMOGLOBIN: 13.3 g/dL (ref 13.0–17.0)
LYMPHS PCT: 28 % (ref 12.0–46.0)
Lymphs Abs: 1.9 10*3/uL (ref 0.7–4.0)
MCHC: 32.7 g/dL (ref 30.0–36.0)
MCV: 88.4 fl (ref 78.0–100.0)
MONO ABS: 0.7 10*3/uL (ref 0.1–1.0)
Monocytes Relative: 9.9 % (ref 3.0–12.0)
Neutro Abs: 3.9 10*3/uL (ref 1.4–7.7)
Neutrophils Relative %: 58.3 % (ref 43.0–77.0)
Platelets: 225 10*3/uL (ref 150.0–400.0)
RBC: 4.59 Mil/uL (ref 4.22–5.81)
RDW: 14.7 % (ref 11.5–15.5)
WBC: 6.7 10*3/uL (ref 4.0–10.5)

## 2017-07-24 MED ORDER — CLONAZEPAM 0.5 MG PO TABS: 0.5000 mg | ORAL_TABLET | Freq: Two times a day (BID) | ORAL | 3 refills | Status: DC | PRN

## 2017-07-24 MED ORDER — FLUTICASONE FUROATE-VILANTEROL 100-25 MCG/INH IN AEPB: 1.0000 | INHALATION_SPRAY | Freq: Every day | RESPIRATORY_TRACT | Status: DC

## 2017-07-24 MED ORDER — FLUTICASONE FUROATE-VILANTEROL 100-25 MCG/INH IN AEPB: 1.0000 | INHALATION_SPRAY | Freq: Every day | RESPIRATORY_TRACT | 2 refills | Status: DC

## 2017-07-24 MED ORDER — ALBUTEROL SULFATE HFA 108 (90 BASE) MCG/ACT IN AERS: INHALATION_SPRAY | RESPIRATORY_TRACT | 1 refills | Status: AC

## 2017-07-24 NOTE — Assessment & Plan Note (Signed)
F/u neuro  Klonopin is helping

## 2017-07-24 NOTE — Assessment & Plan Note (Signed)
Well controlled, no changes to meds. Encouraged heart healthy diet such as the DASH diet and exercise as tolerated.  °

## 2017-07-24 NOTE — Assessment & Plan Note (Signed)
Per endo °

## 2017-07-24 NOTE — Assessment & Plan Note (Signed)
W/u in hosp neg Pt has f/u with cardiology Felt pain was actually coming from his L shoulder-- pt will f/u ortho

## 2017-07-24 NOTE — Patient Instructions (Signed)
Chest Wall Pain °Chest wall pain is pain in or around the bones and muscles of your chest. Sometimes, an injury causes this pain. Sometimes, the cause may not be known. This pain may take several weeks or longer to get better. °Follow these instructions at home: °Pay attention to any changes in your symptoms. Take these actions to help with your pain: °· Rest as told by your health care provider. °· Avoid activities that cause pain. These include any activities that use your chest muscles or your abdominal and side muscles to lift heavy items. °· If directed, apply ice to the painful area: °¨ Put ice in a plastic bag. °¨ Place a towel between your skin and the bag. °¨ Leave the ice on for 20 minutes, 2-3 times per day. °· Take over-the-counter and prescription medicines only as told by your health care provider. °· Do not use tobacco products, including cigarettes, chewing tobacco, and e-cigarettes. If you need help quitting, ask your health care provider. °· Keep all follow-up visits as told by your health care provider. This is important. °Contact a health care provider if: °· You have a fever. °· Your chest pain becomes worse. °· You have new symptoms. °Get help right away if: °· You have nausea or vomiting. °· You feel sweaty or light-headed. °· You have a cough with phlegm (sputum) or you cough up blood. °· You develop shortness of breath. °This information is not intended to replace advice given to you by your health care provider. Make sure you discuss any questions you have with your health care provider. °Document Released: 10/17/2005 Document Revised: 02/25/2016 Document Reviewed: 01/12/2015 °Elsevier Interactive Patient Education © 2017 [employer] ° °

## 2017-07-24 NOTE — Assessment & Plan Note (Signed)
Tolerating statin, encouraged heart healthy diet, avoid trans fats, minimize simple carbs and saturated fats. Increase exercise as tolerated 

## 2017-07-24 NOTE — Progress Notes (Signed)
Patient ID: Edward [name], male    DOB: November 20, 1940  Age: 76 y.o. 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  Cardiology saw the pt in the hospital and he has a f/u soon.  He also has f/u with pulmonary-- he wanted a breo inhaler because that was given to him in the hospital and he said it really helped him.     Review of Systems  Constitutional: Positive for fatigue. Negative for unexpected weight change.  HENT: Negative for congestion, ear pain, hearing loss, nosebleeds, postnasal drip, rhinorrhea, sinus pressure, sneezing and tinnitus.   Eyes: Negative for photophobia, discharge, itching and visual disturbance.  Respiratory: Negative.  Negative for cough and shortness of breath.   Cardiovascular: Negative.  Negative for chest pain and palpitations.  Gastrointestinal: Negative for abdominal distention, abdominal pain, anal bleeding, blood in stool and constipation.  Endocrine: Negative.   Genitourinary: Negative.   Musculoskeletal: Positive for arthralgias.  Skin: Negative.   Allergic/Immunologic: Negative.   Neurological: Negative for dizziness, weakness, light-headedness, numbness and headaches.  Psychiatric/Behavioral: Negative for agitation, confusion, decreased concentration, dysphoric mood, sleep disturbance and suicidal ideas. The patient is not nervous/anxious.     History Past Medical History:  Diagnosis Date  . Adenomatous colon polyp 04/2011  . CAD (coronary artery disease)    a. 01/2015 DES to LAD  b. 12/2015: [place] 85% oLCx lesion--> rx therapy.  . Chronic diastolic CHF (congestive heart failure) ([place])    a. 05/2017 Echo: EF 60-65%, Gr1 DD, no rwma, mildly dil LA, nl RV fxn, mild TR.  . CKD (chronic kidney disease), stage II    a. probable CKD II-III with baseline CR 1.1-1.3.  . DM type 2 (diabetes mellitus, type 2),  insulin dependent 01/29/2014   fasting cbg 50-120 with new regimen  . Dyslipidemia, goal LDL below 70 01/29/2014  . Enlarged prostate   . Essential tremor    a. on proprnolol  . GERD (gastroesophageal reflux disease)   . Hypertension   . Internal hemorrhoid   . Sleep apnea    does not use cpap (06/04/2015)  . TIA (transient ischemic attack) 2002    He has a past surgical history that includes Total knee arthroplasty (Bilateral); Lumbar disc surgery; Cataract extraction (Bilateral); Joint replacement; Back surgery; Cataract extraction, bilateral (Bilateral); Shoulder arthroscopy (Right, 07/30/2014); left heart catheterization with coronary angiogram (N/A, 01/28/2014); left heart catheterization with coronary angiogram (N/A, 01/30/2015); Cardiac catheterization (01/28/14); Coronary angioplasty with stent (01/30/2015); Cardiac catheterization (N/A, 12/21/2015); Cardiac catheterization (N/A, 11/02/2016); Coronary artery bypass graft (N/A, 11/09/2016); TEE without cardioversion (N/A, 11/09/2016); and Endoharvest vein of greater saphenous vein (Left, 11/09/2016).   His family history includes Alzheimer's disease in his sister; Asthma in his brother; CAD in his brother and sister; Diabetes in his other; Prostate cancer in his brother.He reports that he has never smoked. He has never used smokeless tobacco. He reports that he does not drink alcohol or use drugs.  Current Outpatient Prescriptions on File Prior to Visit  Medication Sig Dispense Refill  . acetaminophen (TYLENOL) 500 MG tablet Take 2 tablets (1,000 mg total) by mouth every 6 (six) hours as needed. (Patient taking differently: Take 1,000 mg by mouth every 6 (six) hours as needed for mild pain. ) 30 tablet 0  . amLODipine (NORVASC) 5 MG tablet Take 1  tablet (5 mg total) by mouth daily. 90 tablet 3  . aspirin EC 81 MG tablet Take 81 mg by mouth daily.    . butalbital-acetaminophen-caffeine (FIORICET, ESGIC) 50-325-40 MG tablet Take 1 tablet by mouth every 6  (six) hours as needed for headache. 20 tablet 0  . clopidogrel (PLAVIX) 75 MG tablet Take 1 tablet (75 mg total) by mouth daily. 90 tablet 3  . diclofenac sodium (VOLTAREN) 1 % GEL Apply 2 g topically 4 (four) times daily as needed (for pain).     Marland Kitchen doxycycline (VIBRAMYCIN) 100 MG capsule Take 100 mg by mouth 2 (two) times daily.    . furosemide (LASIX) 40 MG tablet Take 1 tablet (40 mg total) by mouth 2 (two) times daily. 60 tablet 6  . gabapentin (NEURONTIN) 300 MG capsule Take 1 capsule (300 mg total) by mouth 2 (two) times daily.    . insulin NPH-regular Human (NOVOLIN 70/30) (70-30) 100 UNIT/ML injection Inject 20-40 Units into the skin 3 (three) times daily with meals. 30 units with Breakfast, 20 units with Lunch, 40 units with Dinner. 10 mL 0  . isosorbide mononitrate (IMDUR) 30 MG 24 hr tablet Take 0.5 tablets (15 mg total) by mouth daily. 30 tablet 0  . meclizine (ANTIVERT) 25 MG tablet Take 1 tablet (25 mg total) by mouth 2 (two) times daily as needed for dizziness. 30 tablet 1  . nitroGLYCERIN (NITROSTAT) 0.4 MG SL tablet Place 1 tablet (0.4 mg total) under the tongue every 5 (five) minutes as needed for chest pain. Max: 3 doses per episode. 30 tablet 0  . Polyvinyl Alcohol-Povidone (REFRESH OP) Place 1 drop into both eyes 3 (three) times daily as needed (for dry eyes). Reported on 12/25/2015    . potassium chloride SA (K-DUR,KLOR-CON) 20 MEQ tablet Take 1.5 tablets (30 mEq total) by mouth daily. 45 tablet 6  . propranolol (INDERAL) 40 MG tablet Take 40 mg by mouth 3 (three) times daily.  0  . psyllium (METAMUCIL) 58.6 % packet Take 1 packet by mouth daily as needed (constipation).    . tamsulosin (FLOMAX) 0.4 MG CAPS capsule Take 0.4 mg by mouth at bedtime.  0   No current facility-administered medications on file prior to visit.      Objective:  Objective  Physical Exam  Constitutional: He is oriented to person, place, and time. Vital signs are normal. He appears well-developed and  well-nourished. He is sleeping.  HENT:  Head: Normocephalic and atraumatic.  Mouth/Throat: Oropharynx is clear and moist.  Eyes: Pupils are equal, round, and reactive to light. EOM are normal.  Neck: Normal range of motion. Neck supple. No thyromegaly present.  Cardiovascular: Normal rate and regular rhythm.   No murmur heard. Pulmonary/Chest: Effort normal and breath sounds normal. No respiratory distress. He has no wheezes. He has no rales. He exhibits no tenderness.  Musculoskeletal: He exhibits no edema or tenderness.  Neurological: He is alert and oriented to person, place, and time.  Skin: Skin is warm and dry.  Psychiatric: His behavior is normal. Judgment and thought content normal. His mood appears anxious.  Pt is not sleeping well  He is asking for a refill on the klonopin  Nursing note and vitals reviewed.  BP (!) 144/76   Pulse 86   Resp 20   Wt 275 lb (124.7 kg)   SpO2 97%   BMI 41.81 kg/m  Wt Readings from Last 3 Encounters:  07/24/17 275 lb (124.7 kg)  07/17/17 272 lb 3.2  oz (123.5 kg)  06/22/17 269 lb 9.6 oz (122.3 kg)     Lab Results  Component Value Date   WBC 5.6 07/17/2017   HGB 12.3 (L) 07/17/2017   HCT 37.2 (L) 07/17/2017   PLT 193 07/17/2017   GLUCOSE 253 (H) 07/17/2017   CHOL 166 02/06/2017   TRIG 146 02/06/2017   HDL 30 (L) 02/06/2017   LDLDIRECT 144.0 07/08/2015   LDLCALC 107 (H) 02/06/2017   ALT 17 06/06/2017   AST 23 06/06/2017   NA 137 07/17/2017   K 3.8 07/17/2017   CL 101 07/17/2017   CREATININE 1.20 07/17/2017   BUN 12 07/17/2017   CO2 30 07/17/2017   TSH 1.362 06/06/2017   PSA 0.74 02/11/2014   INR 0.96 06/06/2017   HGBA1C 9.8 (H) 06/06/2017   MICROALBUR 62.1 (H) 01/02/2017    Dg Chest 2 View  Result Date: 07/16/2017 CLINICAL DATA:  Chills, SOB, fever. Patient not able to lift left arm for lateral chest. EXAM: CHEST  2 VIEW COMPARISON:  One day prior FINDINGS: Lateral view degraded by patient arm position. Midline trachea.  Normal heart size. Prior median sternotomy. No pleural effusion or pneumothorax. Mildly low low lung volumes. Minimal volume loss and subsegmental atelectasis at both lung bases. Slightly improved aeration. IMPRESSION: Slightly improved bibasilar aeration, with subsegmental atelectasis remaining. Electronically Signed   By: Abigail Miyamoto M.D.   On: 07/16/2017 11:24   Dg Chest 2 View  Result Date: 07/15/2017 CLINICAL DATA:  Chest pain. EXAM: CHEST  2 VIEW COMPARISON:  June 07, 2017 FINDINGS: Cardiomegaly. The hila and mediastinum are normal. Mild opacity in the bases may have an atelectatic component. However, developing infiltrate, particularly in the left base is not completely excluded. Recommend clinical correlation and follow-up as clinically warranted. No edema. IMPRESSION: Mild opacity in the bases, left greater than right could represent atelectasis or developing infiltrate. Recommend follow-up to resolution. Electronically Signed   By: Dorise Bullion III M.D   On: 07/15/2017 13:09   Dg Shoulder Left  Result Date: 07/15/2017 CLINICAL DATA:  Pain. EXAM: LEFT SHOULDER - 2+ VIEW COMPARISON:  February 06, 2017 FINDINGS: Extensive remodeling of the humeral head with loss of joint space and osteophytosis, unchanged since April 2018, consistent with a chronic osteoarthritic process. No fracture or dislocation. No other acute abnormalities. IMPRESSION: Severe degenerative changes.  No fracture or dislocation. Electronically Signed   By: Dorise Bullion III M.D   On: 07/15/2017 13:07     Assessment & Plan:  Plan  I have changed Edward [name] VENTOLIN HFA to albuterol. I am also having him start on fluticasone furoate-vilanterol. Additionally, I am having him maintain his aspirin EC, Polyvinyl Alcohol-Povidone (REFRESH OP), diclofenac sodium, acetaminophen, propranolol, clopidogrel, gabapentin, amLODipine, psyllium, butalbital-acetaminophen-caffeine, tamsulosin, furosemide, potassium chloride SA, meclizine,  nitroGLYCERIN, insulin NPH-regular Human, isosorbide mononitrate, doxycycline, and clonazePAM.  Meds ordered this encounter  Medications  . clonazePAM (KLONOPIN) 0.5 MG tablet    Sig: Take 1 tablet (0.5 mg total) by mouth 2 (two) times daily as needed (tremors).    Dispense:  45 tablet    Refill:  3  . DISCONTD: fluticasone furoate-vilanterol (BREO ELLIPTA) 100-25 MCG/INH 1 puff  . fluticasone furoate-vilanterol (BREO ELLIPTA) 100-25 MCG/INH AEPB    Sig: Inhale 1 puff into the lungs daily.    Dispense:  1 each    Refill:  2  . albuterol (VENTOLIN HFA) 108 (90 Base) MCG/ACT inhaler    Sig: inhale 2 puffs by mouth every 6  hours if needed for wheezing or shortness of breath    Dispense:  18 g    Refill:  1    Problem List Items Addressed This Visit      Unprioritized   Anxiety   Relevant Medications   clonazePAM (KLONOPIN) 0.5 MG tablet   Bronchospasm   Relevant Medications   fluticasone furoate-vilanterol (BREO ELLIPTA) 100-25 MCG/INH AEPB   albuterol (VENTOLIN HFA) 108 (90 Base) MCG/ACT inhaler   Chest pain - Primary    W/u in hosp neg Pt has f/u with cardiology [place] pain was actually coming from his L shoulder-- pt will f/u ortho      Diabetes mellitus ([place])    Per endo      Essential hypertension (Chronic)    Well controlled, no changes to meds. Encouraged heart healthy diet such as the DASH diet and exercise as tolerated.        Relevant Orders   Lipid panel   CBC with Differential/Platelet   Comprehensive metabolic panel   Essential tremor    F/u neuro  Klonopin is helping      Hyperlipidemia LDL goal <70 (Chronic)    Tolerating statin, encouraged heart healthy diet, avoid trans fats, minimize simple carbs and saturated fats. Increase exercise as tolerated      Relevant Orders   Lipid panel   CBC with Differential/Platelet   Comprehensive metabolic panel   Obstructive sleep apnea    Per pulm On cpap       Other Visit Diagnoses    Need for  hepatitis C screening test       Relevant Orders   Hepatitis C antibody      Follow-up: Return in about 6 months (around 01/21/2018), or if symptoms worsen or fail to improve.  Ann Held, DO

## 2017-07-24 NOTE — Assessment & Plan Note (Signed)
Per pulm  On cpap 

## 2017-07-25 ENCOUNTER — Telehealth: Payer: Self-pay

## 2017-07-25 ENCOUNTER — Telehealth: Payer: Self-pay | Admitting: Internal Medicine

## 2017-07-25 LAB — HEPATITIS C ANTIBODY
Hepatitis C Ab: NONREACTIVE
SIGNAL TO CUT-OFF: 0.06 (ref <1.00)

## 2017-07-25 NOTE — Telephone Encounter (Signed)
Patient calling to state he needs to come in for appointment he missed when hospitalized. I see nothing open until at least December, please call patient and advise.

## 2017-07-25 NOTE — Telephone Encounter (Signed)
Patient called back, advised that he could not come today; I spoke with Dr.Gherghe and she is trying to find a spot for him; for me to call him back.

## 2017-07-25 NOTE — Telephone Encounter (Signed)
When would you like to see him?? Thank you!

## 2017-07-25 NOTE — Telephone Encounter (Signed)
Left message for patient to call back if he could come get seen today at the times requested by MD. Left call back number.

## 2017-07-25 NOTE — Telephone Encounter (Signed)
Can he come today at 2:30? If not, 4:15.

## 2017-07-27 ENCOUNTER — Encounter: Payer: Self-pay | Admitting: Acute Care

## 2017-07-27 ENCOUNTER — Ambulatory Visit (INDEPENDENT_AMBULATORY_CARE_PROVIDER_SITE_OTHER): Payer: Medicare Other | Admitting: Acute Care

## 2017-07-27 VITALS — BP 150/78 | HR 69 | Temp 98.2°F | Ht 68.0 in | Wt 275.6 lb

## 2017-07-27 DIAGNOSIS — I25810 Atherosclerosis of coronary artery bypass graft(s) without angina pectoris: Secondary | ICD-10-CM

## 2017-07-27 DIAGNOSIS — J849 Interstitial pulmonary disease, unspecified: Secondary | ICD-10-CM

## 2017-07-27 DIAGNOSIS — J209 Acute bronchitis, unspecified: Secondary | ICD-10-CM

## 2017-07-27 DIAGNOSIS — R079 Chest pain, unspecified: Secondary | ICD-10-CM | POA: Diagnosis not present

## 2017-07-27 DIAGNOSIS — R0602 Shortness of breath: Secondary | ICD-10-CM

## 2017-07-27 DIAGNOSIS — G4733 Obstructive sleep apnea (adult) (pediatric): Secondary | ICD-10-CM

## 2017-07-27 MED ORDER — FLUTICASONE FUROATE-VILANTEROL 100-25 MCG/INH IN AEPB: 1.0000 | INHALATION_SPRAY | Freq: Every day | RESPIRATORY_TRACT | 0 refills | Status: DC

## 2017-07-27 NOTE — Assessment & Plan Note (Signed)
Dyspnea improved in the hospital with use of Breo Patient was unable to obtain. Was in outpatient due to cost Plan We will restart your Breo. We will give you a sample of Breo  Take 1 puff once daily  Rinse mouth after use. Use rescue inhaler as needed for shortness of breath or wheezing. Call your insurance company and ask which drug in Breo's class they cover. Then call us so we can send in a prescription. Follow up with Dr. Lake Bells in 1 month or first available after one month. Please contact office for sooner follow up if symptoms do not improve or worsen or seek emergency care

## 2017-07-27 NOTE — Assessment & Plan Note (Signed)
Body mass index is 41.9 kg/m.  Plan Please work on weight loss

## 2017-07-27 NOTE — Patient Instructions (Addendum)
It is nice to meet you today. We will restart your Breo. We will give you a sample of Breo  Take 1 puff once daily  Rinse mouth after use. Use rescue inhaler as needed for shortness of breath or wheezing. Call your insurance company and ask which drug in Breo's class they cover. Then call us so we can send in a prescription. Follow up with [facility] 10/1 as is scheduled. CXR in 2 weeks ( go to the basement) Follow up with Dr. [place] Bells in 1 month or first available after one month. Please contact office for sooner follow up if symptoms do not improve or worsen or seek emergency care  Overnight oximetry, please schedule to ensure no night time desaturations.

## 2017-07-27 NOTE — Progress Notes (Signed)
History of Present Illness Edward [name] is a 76 y.o. 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 He is followed by Dr. Lake Bells.  Synopsis: Referred in 2016 for dyspnea, has a history of asthma.  Never smoked. He has significant acid reflux. He also has coronary artery disease,CHF, stage IIi chronic kidney disease, poorly controlled diabetes mellitus 2, HLD, and OSA not on CPAP. Patient is morbidly obese.. 1/15 Hgb 12.10 July 2015 pulmonary function testing ratio 82%, FEV1 2.10 L (81% predicted), total lung capacity 4.21 L (63% predicted), DLCO 21.17 (71% predicted)  08/2015 HRCT> mild GGO, likley pulmonary edema and less likely NSIP; 3 vessel CAD noted, mild likley reactive lymphadenopathy June 2017 pulmonary function testing ratio 83%, FEV1 1.98 L (76% predicted), FVC 2.38 L (68% predicted), DLCO 20.47 (69% predicted), patient refused pleh  07/27/2017  Hospital Follow up: Pt. Presents for hospital follow up. He was hospitalized 9/15-9/17 for chest pain with a 2 day history of left-sided chest/left shoulder pain, one week of chills, dry cough with no fever, wheezing, but no sick contacts. Cardiology was consult to to rule out MI, and evaluate  acute on chronic CHF. He was ruled out for MI as an inpatient . He is to follow up with cardiology ( Dr. Pernell Dupre) for NST as an outpatient.He states he has an appointment with cardiology 07/31/2017. His heart failure was treated with lasix, and he was diuresed 1.3 L during admission.He was treated with Doxycycline for acute bronchitis. Chest x-rays were indicative of atelectasis. He was given Breo in the hospital and states that this helped his dyspnea. Pt. Presents today stating he continues to have shortness of breath in the morning  that starts with chills and a hot flash.He states this pattern has been ongoing x 1 month prior to his admission. Pt. States he is not on his Breo as his insurance did not pay for it.He did not call us and let us know this so he has been  doing without it.He states he is using his rescue inhaler about 3 times daily. He completed his doxycycline. He has a productive cough in the morning. Sputum is clear. He sleeps with 2 pillows at night as he has been doing for the last 2 months. We did discuss the fact that his weight may be playing a part in his dyspnea. He denies fever, chest pain, or hemoptysis. He endorses a two-month period of time that he has been using 2 pillows to sleep.   Test Results:  CBC Latest Ref Rng & Units 07/24/2017 07/17/2017 07/15/2017  WBC 4.0 - 10.5 K/uL 6.7 5.6 6.3  Hemoglobin 13.0 - 17.0 g/dL 13.3 12.3(L) 11.7(L)  Hematocrit 39.0 - 52.0 % 40.6 37.2(L) 36.0(L)  Platelets 150.0 - 400.0 K/uL 225.0 193 200    BMP Latest Ref Rng & Units 07/24/2017 07/17/2017 07/16/2017  Glucose 70 - 99 mg/dL 286(H) 253(H) 257(H)  BUN 6 - 23 mg/dL 12 12 9   Creatinine 0.40 - 1.50 mg/dL 1.11 1.20 1.11  BUN/Creat Ratio 10 - 24 - - -  Sodium 135 - 145 mEq/L 142 137 138  Potassium 3.5 - 5.1 mEq/L 3.8 3.8 3.6  Chloride 96 - 112 mEq/L 103 101 101  CO2 19 - 32 mEq/L 31 30 31   Calcium 8.4 - 10.5 mg/dL 8.8 8.6(L) 8.3(L)    BNP    Component Value Date/Time   BNP 26.8 07/15/2017 1109    ProBNP    Component Value Date/Time   PROBNP 24.0 07/24/2015 0841  PFT    Component Value Date/Time   FEV1PRE 1.81 04/19/2016 0954   FEV1POST 1.98 04/19/2016 0954   FVCPRE 2.21 04/19/2016 0954   FVCPOST 2.38 04/19/2016 0954   TLC 4.21 07/21/2015 1048   DLCOUNC 20.47 04/19/2016 0954   PREFEV1FVCRT 82 04/19/2016 0954   PSTFEV1FVCRT 83 04/19/2016 0954    Dg Chest 2 View  Result Date: 07/16/2017 CLINICAL DATA:  Chills, SOB, fever. Patient not able to lift left arm for lateral chest. EXAM: CHEST  2 VIEW COMPARISON:  One day prior FINDINGS: Lateral view degraded by patient arm position. Midline trachea. Normal heart size. Prior median sternotomy. No pleural effusion or pneumothorax. Mildly low low lung volumes. Minimal volume loss and  subsegmental atelectasis at both lung bases. Slightly improved aeration. IMPRESSION: Slightly improved bibasilar aeration, with subsegmental atelectasis remaining. Electronically Signed   By: Abigail Miyamoto M.D.   On: 07/16/2017 11:24   Dg Chest 2 View  Result Date: 07/15/2017 CLINICAL DATA:  Chest pain. EXAM: CHEST  2 VIEW COMPARISON:  June 07, 2017 FINDINGS: Cardiomegaly. The hila and mediastinum are normal. Mild opacity in the bases may have an atelectatic component. However, developing infiltrate, particularly in the left base is not completely excluded. Recommend clinical correlation and follow-up as clinically warranted. No edema. IMPRESSION: Mild opacity in the bases, left greater than right could represent atelectasis or developing infiltrate. Recommend follow-up to resolution. Electronically Signed   By: Dorise Bullion III M.D   On: 07/15/2017 13:09   Dg Shoulder Left  Result Date: 07/15/2017 CLINICAL DATA:  Pain. EXAM: LEFT SHOULDER - 2+ VIEW COMPARISON:  February 06, 2017 FINDINGS: Extensive remodeling of the humeral head with loss of joint space and osteophytosis, unchanged since April 2018, consistent with a chronic osteoarthritic process. No fracture or dislocation. No other acute abnormalities. IMPRESSION: Severe degenerative changes.  No fracture or dislocation. Electronically Signed   By: Dorise Bullion III M.D   On: 07/15/2017 13:07     Past medical hx Past Medical History:  Diagnosis Date  . Adenomatous colon polyp 04/2011  . CAD (coronary artery disease)    a. 01/2015 DES to LAD  b. 12/2015: [place] 85% oLCx lesion--> rx therapy.  . Chronic diastolic CHF (congestive heart failure) ([place])    a. 05/2017 Echo: EF 60-65%, Gr1 DD, no rwma, mildly dil LA, nl RV fxn, mild TR.  . CKD (chronic kidney disease), stage II    a. probable CKD II-III with baseline CR 1.1-1.3.  . DM type 2 (diabetes mellitus, type 2), insulin dependent 01/29/2014   fasting cbg 50-120 with new regimen  . Dyslipidemia,  goal LDL below 70 01/29/2014  . Enlarged prostate   . Essential tremor    a. on proprnolol  . GERD (gastroesophageal reflux disease)   . Hypertension   . Internal hemorrhoid   . Sleep apnea    does not use cpap (06/04/2015)  . TIA (transient ischemic attack) 2002     Social History  Substance Use Topics  . Smoking status: Never Smoker  . Smokeless tobacco: Never Used  . Alcohol use No    Mr.Boettcher reports that he has never smoked. He has never used smokeless tobacco. He reports that he does not drink alcohol or use drugs.  Tobacco Cessation: Patient is a never smoker  Past surgical hx, Family hx, Social hx all reviewed.  Current Outpatient Prescriptions on File Prior to Visit  Medication Sig  . acetaminophen (TYLENOL) 500 MG tablet Take 2 tablets (1,000 mg  total) by mouth every 6 (six) hours as needed. (Patient taking differently: Take 1,000 mg by mouth every 6 (six) hours as needed for mild pain. )  . albuterol (VENTOLIN HFA) 108 (90 Base) MCG/ACT inhaler inhale 2 puffs by mouth every 6 hours if needed for wheezing or shortness of breath  . amLODipine (NORVASC) 5 MG tablet Take 1 tablet (5 mg total) by mouth daily.  Marland Kitchen aspirin EC 81 MG tablet Take 81 mg by mouth daily.  . clonazePAM (KLONOPIN) 0.5 MG tablet Take 1 tablet (0.5 mg total) by mouth 2 (two) times daily as needed (tremors).  . clopidogrel (PLAVIX) 75 MG tablet Take 1 tablet (75 mg total) by mouth daily.  . diclofenac sodium (VOLTAREN) 1 % GEL Apply 2 g topically 4 (four) times daily as needed (for pain).   . furosemide (LASIX) 40 MG tablet Take 1 tablet (40 mg total) by mouth 2 (two) times daily.  Marland Kitchen gabapentin (NEURONTIN) 300 MG capsule Take 1 capsule (300 mg total) by mouth 2 (two) times daily.  . insulin NPH-regular Human (NOVOLIN 70/30) (70-30) 100 UNIT/ML injection Inject 20-40 Units into the skin 3 (three) times daily with meals. 30 units with Breakfast, 20 units with Lunch, 40 units with Dinner.  . meclizine  (ANTIVERT) 25 MG tablet Take 1 tablet (25 mg total) by mouth 2 (two) times daily as needed for dizziness.  . nitroGLYCERIN (NITROSTAT) 0.4 MG SL tablet Place 1 tablet (0.4 mg total) under the tongue every 5 (five) minutes as needed for chest pain. Max: 3 doses per episode.  . Polyvinyl Alcohol-Povidone (REFRESH OP) Place 1 drop into both eyes 3 (three) times daily as needed (for dry eyes). Reported on 12/25/2015  . potassium chloride SA (K-DUR,KLOR-CON) 20 MEQ tablet Take 1.5 tablets (30 mEq total) by mouth daily.  . propranolol (INDERAL) 40 MG tablet Take 40 mg by mouth 3 (three) times daily.  . psyllium (METAMUCIL) 58.6 % packet Take 1 packet by mouth daily as needed (constipation).  . tamsulosin (FLOMAX) 0.4 MG CAPS capsule Take 0.4 mg by mouth at bedtime.  . butalbital-acetaminophen-caffeine (FIORICET, ESGIC) 50-325-40 MG tablet Take 1 tablet by mouth every 6 (six) hours as needed for headache. (Patient not taking: Reported on 07/27/2017)  . fluticasone furoate-vilanterol (BREO ELLIPTA) 100-25 MCG/INH AEPB Inhale 1 puff into the lungs daily. (Patient not taking: Reported on 07/27/2017)   No current facility-administered medications on file prior to visit.      Allergies  Allergen Reactions  . Pneumococcal Vaccines Anaphylaxis    Review Of Systems:  Constitutional:   No  weight loss, night sweats,  Fevers, chills, +fatigue, or  lassitude.  HEENT:   No headaches,  Difficulty swallowing,  Tooth/dental problems, or  Sore throat,                No sneezing, itching, ear ache, nasal congestion, post nasal drip,   CV:  No chest pain,  Orthopnea, PND, swelling in lower extremities, anasarca, dizziness, palpitations, syncope.   GI  No heartburn, indigestion, abdominal pain, nausea, vomiting, diarrhea, change in bowel habits, loss of appetite, bloody stools.   Resp: + shortness of breath with exertion or at rest.  + excess mucus in the morning which then clears, no productive cough,  No  non-productive cough,  No coughing up of blood.  No change in color of mucus.  + wheezing.  No chest wall deformity  Skin: no rash or lesions.  GU: no dysuria, change in color of  urine, no urgency or frequency.  No flank pain, no hematuria   MS:  No joint pain or swelling.  No decreased range of motion.  No back pain.  Psych:  No change in mood or affect. No depression or anxiety.  No memory loss.   Vital Signs BP (!) 150/78 (BP Location: Left Arm, Cuff Size: Normal)   Pulse 69   Temp 98.2 F (36.8 C)   Ht 5\' 8"  (1.727 m)   Wt 275 lb 9.6 oz (125 kg)   SpO2 100%   BMI 41.90 kg/m    Physical Exam:  General- A&Ox3, pleasant, obese gentleman in no apparent distress ENT: No sinus tenderness, TM clear, pale nasal mucosa, no oral exudate,no post nasal drip, no LAN Cardiac: S1, S2, regular rate and rhythm, no murmur Chest: + Fine wheeze/ No rales/ dullness; no accessory muscle use, no nasal flaring, no sternal retractions Abd.: Soft Non-tender, nondistended, obese Ext: No clubbing cyanosis, 1+ bilateral lower extremity edema Neuro:  normal strength, limited by body habitus, cranial nerves intact, appropriate Skin: No rashes, warm and dry Psych: normal mood and behavior   Assessment/Plan  Obstructive sleep apnea OSA without need of CPAP Question if patient may have overnight desaturations Plan Overnight oximetry, please schedule to ensure no night time desaturations. Follow up with Dr. Lake Bells in 1 month or first available after one month. Please contact office for sooner follow up if symptoms do not improve or worsen or seek emergency care    Acute bronchitis Resolving after hospitalization for chest pain Completed course of doxycycline as inpatient and continued as outpatient Plan We will restart your Breo. We will give you a sample of Breo  Take 1 puff once daily  Rinse mouth after use. Use rescue inhaler as needed for shortness of breath or wheezing. Call your  insurance company and ask which drug in Breo's class they cover. Then call us so we can send in a prescription. CXR in 2 weeks ( go to the basement) Follow up with Dr. Lake Bells in 1 month or first available after one month. Please contact office for sooner follow up if symptoms do not improve or worsen or seek emergency care       Severe obesity (BMI >= 40) (HCC) Body mass index is 41.9 kg/m.  Plan Please work on weight loss   SOB (shortness of breath) Dyspnea improved in the hospital with use of Breo Patient was unable to obtain. Was in outpatient due to cost Plan We will restart your Breo. We will give you a sample of Breo  Take 1 puff once daily  Rinse mouth after use. Use rescue inhaler as needed for shortness of breath or wheezing. Call your insurance company and ask which drug in Breo's class they cover. Then call us so we can send in a prescription. Follow up with Dr. Lake Bells in 1 month or first available after one month. Please contact office for sooner follow up if symptoms do not improve or worsen or seek emergency care      Chest pain Chest pain requiring hospitalization 07/2017 to rule out MI Acute exacerbation of acute on chronic CHF Plan Follow up with cardiology as is scheduled Continue Lasix as is ordered Monitor weight    Magdalen Spatz, NP 07/27/2017  8:47 PM

## 2017-07-27 NOTE — Assessment & Plan Note (Signed)
Chest pain requiring hospitalization 07/2017 to rule out MI Acute exacerbation of acute on chronic CHF Plan Follow up with cardiology as is scheduled Continue Lasix as is ordered Monitor weight

## 2017-07-27 NOTE — Assessment & Plan Note (Signed)
Resolving after hospitalization for chest pain Completed course of doxycycline as inpatient and continued as outpatient Plan We will restart your Breo. We will give you a sample of Breo  Take 1 puff once daily  Rinse mouth after use. Use rescue inhaler as needed for shortness of breath or wheezing. Call your insurance company and ask which drug in Breo's class they cover. Then call us so we can send in a prescription. CXR in 2 weeks ( go to the basement) Follow up with Dr. Lake Bells in 1 month or first available after one month. Please contact office for sooner follow up if symptoms do not improve or worsen or seek emergency care

## 2017-07-27 NOTE — Assessment & Plan Note (Signed)
OSA without need of CPAP Question if patient may have overnight desaturations Plan Overnight oximetry, please schedule to ensure no night time desaturations. Follow up with Dr. Lake Bells in 1 month or first available after one month. Please contact office for sooner follow up if symptoms do not improve or worsen or seek emergency care

## 2017-07-28 ENCOUNTER — Other Ambulatory Visit: Payer: Self-pay | Admitting: Family Medicine

## 2017-07-28 ENCOUNTER — Telehealth: Payer: Self-pay | Admitting: Acute Care

## 2017-07-28 DIAGNOSIS — G4733 Obstructive sleep apnea (adult) (pediatric): Secondary | ICD-10-CM

## 2017-07-28 DIAGNOSIS — E785 Hyperlipidemia, unspecified: Secondary | ICD-10-CM

## 2017-07-28 DIAGNOSIS — Z9989 Dependence on other enabling machines and devices: Principal | ICD-10-CM

## 2017-07-28 MED ORDER — PRAVASTATIN SODIUM 20 MG PO TABS: 20.0000 mg | ORAL_TABLET | Freq: Every day | ORAL | 3 refills | Status: DC

## 2017-07-28 NOTE — Telephone Encounter (Signed)
Called [facility] but their office is closed. We can call on Monday.

## 2017-07-31 ENCOUNTER — Encounter: Payer: Self-pay | Admitting: Nurse Practitioner

## 2017-07-31 ENCOUNTER — Ambulatory Visit (INDEPENDENT_AMBULATORY_CARE_PROVIDER_SITE_OTHER): Payer: Medicare Other | Admitting: Nurse Practitioner

## 2017-07-31 ENCOUNTER — Other Ambulatory Visit: Payer: Medicare Other

## 2017-07-31 VITALS — BP 138/60 | HR 79 | Ht 68.0 in | Wt 277.0 lb

## 2017-07-31 DIAGNOSIS — I25810 Atherosclerosis of coronary artery bypass graft(s) without angina pectoris: Secondary | ICD-10-CM

## 2017-07-31 DIAGNOSIS — I5032 Chronic diastolic (congestive) heart failure: Secondary | ICD-10-CM | POA: Diagnosis not present

## 2017-07-31 NOTE — Patient Instructions (Addendum)
We will be checking the following labs today - NONE   Medication Instructions:    Continue with your current medicines.     Testing/Procedures To Be Arranged:  N/A  Follow-Up:   See me in about 3 to 4 months.     Other Special Instructions:   N/A    If you need a refill on your cardiac medications before your next appointment, please call your pharmacy.   Call the [facility] office at [phone] if you have any questions, problems or concerns.

## 2017-07-31 NOTE — Telephone Encounter (Signed)
Called and spoke to [name]. [name] states because the pt has OSA he cannot have ONO to qualify for nocturnal O2, pt will need a CPAP titration to show if he needs O2. If he does need O2 during study they will apply the O2 and titrate as necessary.   Sarah please advise if this is ok to order. Thanks.

## 2017-07-31 NOTE — Progress Notes (Signed)
Reviewed, agree 

## 2017-07-31 NOTE — Telephone Encounter (Signed)
Please place the order for the CPAP titration. Thanks

## 2017-07-31 NOTE — Progress Notes (Signed)
CARDIOLOGY OFFICE NOTE  Date:  07/31/2017    [name] Date of Birth: [birth year] Medical Record #[id number]  PCP:  Ann Held, DO  Cardiologist:  Tamala Julian   Chief Complaint  Patient presents with  . Congestive Heart Failure    6 week check - seen for Dr. Tamala Julian    History of Present Illness: Edward [name] is a 76 y.o. male who presents today for a 6 week check. Seen for Dr. Tamala Julian.   He has a known history of CAD with April 2016 drug-eluting stent and admission in February 2017 with unstable angina due to high-grade circumflex being treated with medical therapy. 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 Roxan Hockey. Other issues include diastolic HF.   Last seen here back in August after having heart failure. Lasix was increased.   Back in the hospital about 3 weeks ago - had atypical chest pain. This was in the setting of chills and cough. Seen by cardiology and discussed possible need for outpatient stress testing. Imdur was to be added.   Comes in today. Here alone. He has done ok. Saw pulmonary back last Friday and he got placed on Breo. Saw PCP last week and was placed on statin therapy. Trying to get his visit with DM moved up. Has had a shot in his shoulder. Never started the Imdur - did not know what it was for so he did not take. He is suppose to go the lab today but has had fairly recent labs. He tells me that he has heard from Dr. Tamala Julian who did not feel like a stress test was needed at this time. He is not having anymore chest pain. His breathing has improved. He is actually walking more. He is happy with how he is doing. Restricting his salt. He has no real concerns today.   Past Medical History:  Diagnosis Date  . Adenomatous colon polyp 04/2011  . CAD (coronary artery disease)    a. 01/2015 DES to LAD  b. 12/2015: [place] 85% oLCx lesion--> rx therapy.  . Chronic diastolic CHF (congestive heart failure) ([place])    a. 05/2017  Echo: EF 60-65%, Gr1 DD, no rwma, mildly dil LA, nl RV fxn, mild TR.  . CKD (chronic kidney disease), stage II    a. probable CKD II-III with baseline CR 1.1-1.3.  . DM type 2 (diabetes mellitus, type 2), insulin dependent 01/29/2014   fasting cbg 50-120 with new regimen  . Dyslipidemia, goal LDL below 70 01/29/2014  . Enlarged prostate   . Essential tremor    a. on proprnolol  . GERD (gastroesophageal reflux disease)   . Hypertension   . Internal hemorrhoid   . Sleep apnea    does not use cpap (06/04/2015)  . TIA (transient ischemic attack) 2002    Past Surgical History:  Procedure Laterality Date  . BACK SURGERY    . CARDIAC CATHETERIZATION  01/28/14   + CAD treat medically  . CARDIAC CATHETERIZATION N/A 12/21/2015   Procedure: Right/Left Heart Cath and Coronary Angiography;  Surgeon: Belva Crome, MD;  Location: [facility];  Service: Cardiovascular;  Laterality: N/A;  . CARDIAC CATHETERIZATION N/A 11/02/2016   Procedure: Left Heart Cath and Coronary Angiography;  Surgeon: Nelva Bush, MD;  Location: [facility];  Service: Cardiovascular;  Laterality: N/A;  . CATARACT EXTRACTION Bilateral   . CATARACT EXTRACTION, BILATERAL Bilateral   . CORONARY  ANGIOPLASTY WITH STENT PLACEMENT  01/30/2015   DES Promus  [place] to LAD by Dr Tamala Julian  . CORONARY ARTERY BYPASS GRAFT N/A 11/09/2016   Procedure: CORONARY ARTERY BYPASS GRAFTING (CABG) x 2;  Surgeon: Melrose Nakayama, MD;  Location: [facility];  Service: Open Heart Surgery;  Laterality: N/A;  . ENDOVEIN HARVEST OF GREATER SAPHENOUS VEIN Left 11/09/2016   Procedure: ENDOVEIN HARVEST OF GREATER SAPHENOUS VEIN;  Surgeon: Melrose Nakayama, MD;  Location: [facility];  Service: Open Heart Surgery;  Laterality: Left;  . JOINT REPLACEMENT    . LEFT HEART CATHETERIZATION WITH CORONARY ANGIOGRAM N/A 01/28/2014   Procedure: LEFT HEART CATHETERIZATION WITH CORONARY ANGIOGRAM;  Surgeon: Sinclair Grooms, MD;  Location: [facility];  Service:  Cardiovascular;  Laterality: N/A;  . LEFT HEART CATHETERIZATION WITH CORONARY ANGIOGRAM N/A 01/30/2015   Procedure: LEFT HEART CATHETERIZATION WITH CORONARY ANGIOGRAM;  Surgeon: Belva Crome, MD;  Location: [facility] C[facility];  Service: Cardiovascular;  Laterality: N/A;  . LUMBAR [place]    . SHOULDER ARTHROSCOPY Right 07/30/2014   Procedure: Right Shoulder Arthroscopy, Debridement, Decompression, Manipulation Under Anesthesia;  Surgeon: Newt Minion, MD;  Location: [place];  Service: Orthopedics;  Laterality: Right;  . TEE WITHOUT CARDIOVERSION N/A 11/09/2016   Procedure: TRANSESOPHAGEAL ECHOCARDIOGRAM (TEE);  Surgeon: Melrose Nakayama, MD;  Location: [place];  Service: Open Heart Surgery;  Laterality: N/A;  . TOTAL KNEE ARTHROPLASTY Bilateral      Medications: Current Meds  Medication Sig  . acetaminophen (TYLENOL) 500 MG tablet Take 2 tablets (1,000 mg total) by mouth every 6 (six) hours as needed. (Patient taking differently: Take 1,000 mg by mouth every 6 (six) hours as needed for mild pain. )  . albuterol (VENTOLIN HFA) 108 (90 Base) MCG/ACT inhaler inhale 2 puffs by mouth every 6 hours if needed for wheezing or shortness of breath  . amLODipine (NORVASC) 5 MG tablet Take 1 tablet (5 mg total) by mouth daily.  Marland Kitchen aspirin EC 81 MG tablet Take 81 mg by mouth daily.  . clonazePAM (KLONOPIN) 0.5 MG tablet Take 1 tablet (0.5 mg total) by mouth 2 (two) times daily as needed (tremors).  . clopidogrel (PLAVIX) 75 MG tablet Take 1 tablet (75 mg total) by mouth daily.  . diclofenac sodium (VOLTAREN) 1 % GEL Apply 2 g topically 4 (four) times daily as needed (for pain).   . fluticasone furoate-vilanterol (BREO ELLIPTA) 100-25 MCG/INH AEPB Inhale 1 puff into the lungs daily.  . fluticasone furoate-vilanterol (BREO ELLIPTA) 100-25 MCG/INH AEPB Inhale 1 puff into the lungs daily.  . furosemide (LASIX) 80 MG tablet Take 80 mg by mouth daily.  Marland Kitchen gabapentin (NEURONTIN) 300 MG capsule Take 1 capsule (300 mg  total) by mouth 2 (two) times daily.  . insulin NPH-regular Human (NOVOLIN 70/30) (70-30) 100 UNIT/ML injection Inject 20-40 Units into the skin 3 (three) times daily with meals. 30 units with Breakfast, 20 units with Lunch, 40 units with Dinner.  . isosorbide mononitrate (IMDUR) 30 MG 24 hr tablet Take 15 mg by mouth daily.  . meclizine (ANTIVERT) 25 MG tablet Take 1 tablet (25 mg total) by mouth 2 (two) times daily as needed for dizziness.  . nitroGLYCERIN (NITROSTAT) 0.4 MG SL tablet Place 1 tablet (0.4 mg total) under the tongue every 5 (five) minutes as needed for chest pain. Max: 3 doses per episode.  . Polyvinyl Alcohol-Povidone (REFRESH OP) Place 1 drop into both eyes 3 (three) times daily as needed (for  dry eyes). Reported on 12/25/2015  . potassium chloride SA (K-DUR,KLOR-CON) 20 MEQ tablet Take 1.5 tablets (30 mEq total) by mouth daily.  . pravastatin (PRAVACHOL) 20 MG tablet Take 1 tablet (20 mg total) by mouth daily.  . propranolol (INDERAL) 40 MG tablet Take 40 mg by mouth 3 (three) times daily.  . psyllium (METAMUCIL) 58.6 % packet Take 1 packet by mouth daily as needed (constipation).  . tamsulosin (FLOMAX) 0.4 MG CAPS capsule Take 0.4 mg by mouth at bedtime.     Allergies: Allergies  Allergen Reactions  . Pneumococcal Vaccines Anaphylaxis    Social History: The patient  reports that he has never smoked. He has never used smokeless tobacco. He reports that he does not drink alcohol or use drugs.   Family History: The patient's family history includes Alzheimer's disease in his sister; Asthma in his brother; CAD in his brother and sister; Diabetes in his other; Prostate cancer in his brother.   Review of Systems: Please see the history of present illness.   Otherwise, the review of systems is positive for none.   All other systems are reviewed and negative.   Physical Exam: VS:  BP 138/60 (BP Location: Left Arm, Patient Position: Sitting, Cuff Size: Large)   Pulse 79    Ht 5\' 8"  (1.727 m)   Wt 277 lb (125.6 kg)   SpO2 99% Comment: at rest  BMI 42.12 kg/m  .  BMI Body mass index is 42.12 kg/m.  Wt Readings from Last 3 Encounters:  07/31/17 277 lb (125.6 kg)  07/27/17 275 lb 9.6 oz (125 kg)  07/24/17 275 lb (124.7 kg)    General: Pleasant. Obese black male. Alert and in no acute distress.   HEENT: Normal.  Neck: Supple, no JVD, carotid bruits, or masses noted.  Cardiac: Regular rate and rhythm. No murmurs, rubs, or gallops. No edema.  Respiratory:  Lungs are clear to auscultation bilaterally with normal work of breathing.  GI: Soft and nontender.  MS: No deformity or atrophy. Gait and ROM intact.  Skin: Warm and dry. Color is normal.  Neuro:  Strength and sensation are intact and no gross focal deficits noted.  Psych: Alert, appropriate and with normal affect.   LABORATORY DATA:  EKG:  EKG is not ordered today.  Lab Results  Component Value Date   WBC 6.7 07/24/2017   HGB 13.3 07/24/2017   HCT 40.6 07/24/2017   PLT 225.0 07/24/2017   GLUCOSE 286 (H) 07/24/2017   CHOL 191 07/24/2017   TRIG 157.0 (H) 07/24/2017   HDL 40.30 07/24/2017   LDLDIRECT 144.0 07/08/2015   LDLCALC 120 (H) 07/24/2017   ALT 14 07/24/2017   AST 14 07/24/2017   NA 142 07/24/2017   K 3.8 07/24/2017   CL 103 07/24/2017   CREATININE 1.11 07/24/2017   BUN 12 07/24/2017   CO2 31 07/24/2017   TSH 1.362 06/06/2017   PSA 0.74 02/11/2014   INR 0.96 06/06/2017   HGBA1C 9.8 (H) 06/06/2017   MICROALBUR 62.1 (H) 01/02/2017     BNP (last 3 results)  Recent Labs  10/29/16 1810 06/06/17 1839 07/15/17 1109  BNP 21.4 53.5 26.8    ProBNP (last 3 results) No results for input(s): PROBNP in the last 8760 hours.   Other Studies Reviewed Today:  2-D Doppler echocardiogram 06/08/17: Study Conclusions  - Left ventricle: The cavity size was normal. There was severe concentric hypertrophy. Systolic function was normal. The estimated ejection fraction was in the  range  of 60% to 65%. Wall motion was normal; there were no regional wall motion abnormalities. Doppler parameters are consistent with abnormal left ventricular relaxation (grade 1 diastolic dysfunction). There was no evidence of elevated ventricular filling pressure by Doppler parameters. - Aortic valve: Trileaflet; normal thickness leaflets. There was no regurgitation. - Aortic root: The aortic root was normal in size. - Mitral valve: There was no regurgitation. - Left atrium: The atrium was mildly dilated. - Right ventricle: Systolic function was normal. - Tricuspid valve: There was mild regurgitation. - Pulmonary arteries: Systolic pressure was within the normal range. - Inferior vena cava: The vessel was not visualized. - Pericardium, extracardiac: There was no pericardial effusion.  Assessment/Plan: 1. Recent admission for chest pain - negative cardiac evaluation - this was in the setting chills/cough - now totally resolved. I would also favor holding on stress test. Discussed with Dr. Tamala Julian who is in agreement. Will follow for now. He has plans for repeat CXR with PCP already arranged.   2. Chronic diastolic HF - looks compensated to me. No changes made.   3. CAD with prior PCI - subsequent CABG - no active chest pain. Would not start Imdur and would hold on stress testing at this time.   4. DM - uncontrolled  5. Tremor  6. CKD - most recent lab noted.   7. HTN - BP is ok on his current regimen.  8. HLD - he says he has just been placed on statin therapy - do not see it listed.   Current medicines are reviewed with the patient today.  The patient does not have concerns regarding medicines other than what has been noted above.  The following changes have been made:  See above.  Labs/ tests ordered today include:   No orders of the defined types were placed in this encounter.    Disposition:   FU with me in 4 months.   Patient is agreeable to this  plan and will call if any problems develop in the interim.   SignedTruitt Merle, NP  07/31/2017 9:33 AM  [facility] [address] [place], [place]  [address] Phone: [phone] Fax: [phone]

## 2017-08-01 NOTE — Telephone Encounter (Signed)
CPAP titration ordered. Nothing further is needed. FYI Pcc's

## 2017-08-02 DIAGNOSIS — H40013 Open angle with borderline findings, low risk, bilateral: Secondary | ICD-10-CM | POA: Diagnosis not present

## 2017-08-02 DIAGNOSIS — H02423 Myogenic ptosis of bilateral eyelids: Secondary | ICD-10-CM | POA: Diagnosis not present

## 2017-08-02 DIAGNOSIS — E113591 Type 2 diabetes mellitus with proliferative diabetic retinopathy without macular edema, right eye: Secondary | ICD-10-CM | POA: Diagnosis not present

## 2017-08-02 DIAGNOSIS — Z961 Presence of intraocular lens: Secondary | ICD-10-CM | POA: Diagnosis not present

## 2017-08-02 DIAGNOSIS — E113492 Type 2 diabetes mellitus with severe nonproliferative diabetic retinopathy without macular edema, left eye: Secondary | ICD-10-CM | POA: Diagnosis not present

## 2017-08-04 ENCOUNTER — Encounter: Payer: Self-pay | Admitting: Internal Medicine

## 2017-08-04 ENCOUNTER — Ambulatory Visit (INDEPENDENT_AMBULATORY_CARE_PROVIDER_SITE_OTHER): Payer: Medicare Other | Admitting: Internal Medicine

## 2017-08-04 VITALS — BP 130/80 | HR 82 | Wt 275.0 lb

## 2017-08-04 DIAGNOSIS — I25810 Atherosclerosis of coronary artery bypass graft(s) without angina pectoris: Secondary | ICD-10-CM | POA: Diagnosis not present

## 2017-08-04 DIAGNOSIS — Z794 Long term (current) use of insulin: Secondary | ICD-10-CM

## 2017-08-04 DIAGNOSIS — E1159 Type 2 diabetes mellitus with other circulatory complications: Secondary | ICD-10-CM

## 2017-08-04 NOTE — Patient Instructions (Signed)
  Please change: Insulin  Before breakfast  Before lunch  Before dinner  Bedtime  Novolin 70/30 (vial) 50 units if you do not exercise 40 units if you exercise 40 40 60   Please come back for a follow-up appointment in 3 months.

## 2017-08-04 NOTE — Progress Notes (Signed)
Patient ID: Edward [name], male   DOB: 05/04/1941, 76 y.o.   MRN: [id number]  HPI: Edward [name] is a 76 y.o.-year-old male, returning for f/u for DM2,  dx 1987, insulin-dependent since 1989, uncontrolled, with complications (CAD - s/p 2 AMIs, CHF, PN). Last visit 2.5 mo ago.  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 He was then in cardiac rehabilitation >> then admitted again 05/2017 for CHF exacerbation.  Latest HbA1c levels: HbA1c calculated from the fructosamine is 6.77% - likely not correct based on his CBGs in his log... Lab Results  Component Value Date   HGBA1C 9.8 (H) 06/06/2017   HGBA1C 7.7 12/16/2016   HGBA1C 9.4 09/13/2016  09/13/2016: HbA1c calculated from fructosamine is 6.86%   He was on: - Levemir 70 units at night (He did not switch to 45 units twice a day as suggested at last visit) - Novolog 15 min before meals: - 30 units before a smaller meal - 40 units before a larger meal He was on Metformin >> N/V/D. U500 insulin was not covered for him despite multiple requests and PAs to the insurance  Now on:  Novolin 70/30: Insulin  Before breakfast  Before lunch  Before dinner  Bedtime  Novolin 70/30 (vial) 40 units   20 >> 40 60 >> 40 60   Pt checks his sugars 3x a day: - am:   115-180 >> w/o exercise: 280-400 (!) >> 130-150 - 2h after b'fast:  52 (took Levemir 2x a day) >> 150 x1 >> n/c - before lunch:  210-250 >> 180-200s >> 180-210 - 2h after lunch:  121-254 >> 170-180s >> n/c  - before dinner: 170s >> 142-180 >> 200-300 >> 120-130 - 2h after dinner: 99, 128, 368 >> n/c >> 280 >> n/c - bedtime: 160s >> n/c >> 140-165 >> 170-175 - nighttime: occas. 87 >> 80s >> 115 >> n/c Lowest sugar was 75 (after increased exercise) >> 83; he has hypoglycemia awareness at 100. Highest sugar was 400s >> 200s.  Has a One Touch Ultra meter.   - he has CKD, last BUN/creatinine:  Lab Results  Component Value Date   BUN 12 07/24/2017   CREATININE 1.11 07/24/2017  Pt is seen by  nephrology - proteinuria (555 mg/24h).On Avapro - Dr. Hollie Salk. - He has hyperlipidemia. Last set of lipids: Lab Results  Component Value Date   CHOL 191 07/24/2017   HDL 40.30 07/24/2017   LDLCALC 120 (H) 07/24/2017   LDLDIRECT 144.0 07/08/2015   TRIG 157.0 (H) 07/24/2017   CHOLHDL 5 07/24/2017  On Pravastatin. - last eye exam was in 04/2017 (Dr Zigmund Daniel). + DR.  Had cataracts sx. Will have eyelid Sx soon. - he does have numbness and tingling in his feet. Sees podiatry. On ASA 81  ROS: Constitutional: + weight gain/no weight loss, no fatigue, + subjective hyperthermia, no subjective hypothermia, + nocturia Eyes: + blurry vision, no xerophthalmia ENT: no sore throat, no nodules palpated in throat, no dysphagia, no odynophagia, no hoarseness Cardiovascular: + CP/+ SOB/no palpitations/+ leg swelling Respiratory: no cough/+ SOB/+ wheezing Gastrointestinal: no N/no V/no D/no C/no acid reflux Musculoskeletal: + muscle aches/no joint aches Skin: no rashes, no hair loss Neurological: + tremors/+ numbness/+ tingling/no dizziness  I reviewed pt's medications, allergies, PMH, social hx, family hx, and changes were documented in the history of present illness. Otherwise, unchanged from my initial visit note.  PE: BP 130/80 (BP Location: Left Arm, Patient Position: Sitting)   Pulse 82   Wt 275  lb (124.7 kg)   SpO2 96%   BMI 41.81 kg/m  Body mass index is 41.81 kg/m. Wt Readings from Last 3 Encounters:  08/04/17 275 lb (124.7 kg)  07/31/17 277 lb (125.6 kg)  07/27/17 275 lb 9.6 oz (125 kg)   Constitutional: overweight, in NAD Eyes: PERRLA, EOMI, no exophthalmos ENT: moist mucous membranes, no thyromegaly, no cervical lymphadenopathy Cardiovascular: RRR, No MRG, + B pitting edema - improved, though Respiratory: CTA B Gastrointestinal: abdomen soft, NT, ND, BS+ Musculoskeletal: no deformities, strength intact in all 4 Skin: moist, warm, no rashes Neurological: no tremor with  outstretched hands, DTR normal in all 4  ASSESSMENT: 1. DM2, insulin-dependent, uncontrolled, with complications - CAD H/o steroid inj's  2. Obesity class 3  Cardiologist: Dr Tamala Julian. Pulmonologist: Dr Lake Bells  PLAN:  1. Patient with long-standing, uncontrolled DM, prev. doing well on U500 insulin, but this is extremely expensive >> tried basal-bolus insulin regimen but not successful >> changed to premixed insulin per his request at last visit. - since then, he changed his regimen by adding a dose of 70/30 at bedtime (!) which is unusual ut his sugars are better and he has no lows >> we will continue this - sugars before lunch are higher >> will increase insulin before b'fast if he is not active that day - reviewed latest HbA1c from 2 mo ago >> higher, 9.8% - I suggested to:  Patient Instructions   Please change: Insulin  Before breakfast  Before lunch  Before dinner  Bedtime  Novolin 70/30 (vial) 50 units if you do not exercise 40 units if you exercise 40 40 60   Please come back for a follow-up appointment in 3 months.  - continue checking sugars at different times of the day - check 3x a day, rotating checks - advised for yearly eye exams >> he is UTD - refuses flu shot - Return to clinic in 3 mo with sugar log    2. Obesity class 3 - weight better after diuresis: 186 >> 174 lbs. Feels much better.   Philemon Kingdom, MD PhD [facility]

## 2017-08-10 ENCOUNTER — Encounter (HOSPITAL_COMMUNITY): Payer: Medicare Other

## 2017-08-11 ENCOUNTER — Telehealth: Payer: Self-pay | Admitting: Interventional Cardiology

## 2017-08-11 NOTE — Telephone Encounter (Signed)
° °  [facility] Pre-operative Risk Assessment    Request for surgical clearance:  1. What type of surgery is being performed? 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   2. When is this surgery scheduled? 08/21/17   3. Are there any medications that need to be held prior to surgery and how long? Plavix prior  4. Practice name and name of physician performing surgery? [facility], Physician not listed   5. What is your office phone and fax number? PH# [phone], FAX# [phone]  6. Anesthesia type (None, local, MAC, general) ? MAC     _________________________________________________________________   (provider comments below)

## 2017-08-11 NOTE — Telephone Encounter (Signed)
Antiplatelets clearance comes from APP/MD.  Pharmacist ([facility]) does not clear plavix, aspirin, brilinta, etc

## 2017-08-14 NOTE — Telephone Encounter (Signed)
    Chart reviewed as part of pre-operative protocol coverage. ROSTON GRUNEWALD was last seen on 07/31/17 by Truitt Merle. Hx of stenting followed by CABG 10/2016. Recent admission for atypical chest pain and lost OV as discussed above. Will route to Dr. Tamala Julian and Cecille Rubin for clearance.   Twain Harte, PA 08/14/2017, 4:37 PM

## 2017-08-15 ENCOUNTER — Other Ambulatory Visit: Payer: Self-pay | Admitting: Neurology

## 2017-08-15 NOTE — Telephone Encounter (Signed)
Cleared fro upcoming surgery. Okay to hold Plavix for 5-7 days before surgery.

## 2017-08-16 NOTE — Telephone Encounter (Signed)
Faxed to requesting office. 

## 2017-08-18 ENCOUNTER — Telehealth: Payer: Self-pay | Admitting: Internal Medicine

## 2017-08-18 ENCOUNTER — Other Ambulatory Visit: Payer: Self-pay

## 2017-08-18 MED ORDER — INSULIN NPH ISOPHANE & REGULAR (70-30) 100 UNIT/ML ~~LOC~~ SUSP: 20.0000 [IU] | Freq: Three times a day (TID) | SUBCUTANEOUS | 0 refills | Status: DC

## 2017-08-18 NOTE — Telephone Encounter (Signed)
Pt needs novolin n called to [facility] on [address] call it to the attention of LeAnn

## 2017-08-18 NOTE — Telephone Encounter (Signed)
Submitted

## 2017-08-21 ENCOUNTER — Telehealth: Payer: Self-pay

## 2017-08-21 ENCOUNTER — Telehealth: Payer: Self-pay | Admitting: Internal Medicine

## 2017-08-21 DIAGNOSIS — H02834 Dermatochalasis of left upper eyelid: Secondary | ICD-10-CM | POA: Diagnosis not present

## 2017-08-21 DIAGNOSIS — H02413 Mechanical ptosis of bilateral eyelids: Secondary | ICD-10-CM | POA: Diagnosis not present

## 2017-08-21 DIAGNOSIS — H53453 Other localized visual field defect, bilateral: Secondary | ICD-10-CM | POA: Diagnosis not present

## 2017-08-21 DIAGNOSIS — H02831 Dermatochalasis of right upper eyelid: Secondary | ICD-10-CM | POA: Diagnosis not present

## 2017-08-21 DIAGNOSIS — H02423 Myogenic ptosis of bilateral eyelids: Secondary | ICD-10-CM | POA: Diagnosis not present

## 2017-08-21 NOTE — Telephone Encounter (Signed)
Please call patient to discuss medication (Nolvin 100) [phone] Please call before noon-going to hospital to have surgery after noon

## 2017-08-21 NOTE — Telephone Encounter (Signed)
Please advise, patient requested I call him regarding his insulin. Patient is unsure if he is suppose to be taking both the regular insulin, and the mix 70/30 insulin. The only one on his chart is 70/30, he stated he has been doing the regular insulin in the morning, and he likes that. If this is okay, I will have to do a PA for him. Just let me know, thank you!

## 2017-08-21 NOTE — Telephone Encounter (Signed)
Called and LVM advising patient to call back to discuss how much of the regular insulin he was taking and what time of the day. Left call back number.

## 2017-08-21 NOTE — Telephone Encounter (Signed)
I do not have this in the chart - how much Insulin R is he taking along with the 70/30 and at which times of the day?

## 2017-08-22 ENCOUNTER — Other Ambulatory Visit: Payer: Self-pay | Admitting: Neurology

## 2017-08-24 NOTE — Telephone Encounter (Signed)
Called pt, LVM.   

## 2017-08-24 NOTE — Telephone Encounter (Signed)
He stated he takes the Nololin 100 (depending on how his sugar reading) as follows:  Morning 20-30 units  Lunch 20-30 units  Dinner 20-30 units   Then he takes 70/30 at night.  [place] Free- He stated his patient assistance form has expired and needs a new one faxed over to [place] for him to be able to afford his insulin. He spoke to his insurance company and one was suppose to be faxed. Could you follow up with that. Thanks!

## 2017-08-24 NOTE — Telephone Encounter (Signed)
This is NOT the regimen that he was telling me he was taking at last visit. I do not feel this R-based regimen iss a good idea - please look at my last OV note for my recommendations.

## 2017-08-25 ENCOUNTER — Telehealth: Payer: Self-pay

## 2017-08-25 NOTE — Telephone Encounter (Signed)
Called patient and advised to call back to discuss the last OV note. Advised that Dr.Gherghe did not feel the regular insulin was a good idea. Gave call back number

## 2017-08-25 NOTE — Telephone Encounter (Signed)
Calling patient back.

## 2017-08-25 NOTE — Telephone Encounter (Signed)
Called and notified patient of the changes, advised I would start the PA for the R insulin as soon as I could. Left call back number.

## 2017-08-25 NOTE — Telephone Encounter (Signed)
Called and LVM advising patient of the message from MD, advised I would start the PA as soon as I could.

## 2017-08-25 NOTE — Telephone Encounter (Signed)
Called and discussed with patient, sent the MD notes on insulin and sugar doses.

## 2017-08-25 NOTE — Telephone Encounter (Signed)
Spoke with representative at [employer]- patient is in [place] that is why his copay for R insulin is $48.72 , since it is at the end of physical year.    Since Novolin R is Tier 3 lowest formulary  , won't be able to reduce copay.   Letter will be sent out to patient explaining.    His copay next physical  year 2019  for R insulin is $35.00 all insulins are in Tier 3  NovolinR  , Novolin N  Novolog Levemir Fiasp,Levemir Flex Pen, Tier 5 is Humulin 500 spoke with Priya per [employer] [phone] .   Case [id number] Patient advised of below and verbalized and understanding.

## 2017-08-25 NOTE — Telephone Encounter (Signed)
Called patient and advised of the regimen from the last OV. Patient states that he is only doing the 70/30 currently, what he gave yesterday was his old regimen that he would like to go back on. Patient gave his sugars from the last three days: 10/19- am; 175  Lunch; [place]; 275  10/20- am; 200 Lunch; [place]; did not check  10/21- only checked at night it was 200, Was unable to do his insulin that day due to eye surgery on the 22nd.  10/22- checked before surgery it was 282.  Please advise on any changes.

## 2017-08-25 NOTE — Telephone Encounter (Signed)
This is the regimen I have on file for him:  Insulin  Before breakfast  Before lunch  Before dinner  Bedtime  Novolin 70/30 (vial) 50 units if you do not exercise 40 units if you exercise 40 40 60   He can add R insulin as follows:  Insulin  Before breakfast  Before lunch  Before dinner  Bedtime  Novolin 70/30 (vial) 50 units if you do not exercise 40 units if you exercise 40 40 60  R insulin 15-20 15-20 15-20    He can mix the insulins in the same column

## 2017-08-25 NOTE — Telephone Encounter (Signed)
Called patient and advised to call back to discuss the last OV note. Advised that Dr.Gherghe did not feel the regular insulin was a good idea. Gave call back number.

## 2017-08-25 NOTE — Telephone Encounter (Signed)
Patient returning Julies call- please call back at your earliest convenience

## 2017-08-30 ENCOUNTER — Telehealth: Payer: Self-pay

## 2017-08-30 NOTE — Telephone Encounter (Signed)
Called to check in on patient and see how things were going. Left call back number to return phone call.

## 2017-08-31 NOTE — Telephone Encounter (Signed)
Patient requests that you please call pharmacy-they do not have a prescription for the Humalin R. Chistopher needs it very badly. Sugar has been very high the last 3 or 4 days. Pharmacy is [facility] on [address]

## 2017-08-31 NOTE — Telephone Encounter (Signed)
Called and notified patient that the PA for the humalin R was submitted today, I sent it as an expedited review, and we would hopefully hear something soon.  Advised to call if sugars continue to increase.

## 2017-09-04 ENCOUNTER — Telehealth: Payer: Self-pay | Admitting: Family Medicine

## 2017-09-04 ENCOUNTER — Other Ambulatory Visit: Payer: Self-pay

## 2017-09-04 ENCOUNTER — Telehealth: Payer: Self-pay | Admitting: Internal Medicine

## 2017-09-04 ENCOUNTER — Telehealth: Payer: Self-pay

## 2017-09-04 MED ORDER — INSULIN REGULAR HUMAN 100 UNIT/ML IJ SOLN: INTRAMUSCULAR | 2 refills | Status: DC

## 2017-09-04 NOTE — Telephone Encounter (Signed)
Please advise, I submitted the PA for his Humalin R, I just recived a request from his pharmacy that wants Novolin R, does it matter which one I submit? I am hoping this has been approved? Thank you!

## 2017-09-04 NOTE — Telephone Encounter (Signed)
Patient talked with Dr. Cruzita Lederer office and it was handled

## 2017-09-04 NOTE — Telephone Encounter (Signed)
We can use whichever covered.

## 2017-09-04 NOTE — Telephone Encounter (Signed)
Patient requesting Almyra Free to call ph# [phone] asap. It is very important. Sugar has been very high all weekend

## 2017-09-04 NOTE — Telephone Encounter (Signed)
Called patient, he advised with me that he spoke with insurance this morning, and he can get the Novolin R insulin for $50, he would like this called in. I called the pharmacy and switch the RX I had just submitted to them. They are going to fill the Novolin R insulin.

## 2017-09-04 NOTE — Telephone Encounter (Signed)
Patient called and requested to have a call back, in regards to coming into the office and picking up insulin. Patient stts that his XI will not cover it and its costs $240.00. Adv patient that I will submit a message to the Nurse. If you have any questions please call patient back at [phone].

## 2017-09-11 DIAGNOSIS — R350 Frequency of micturition: Secondary | ICD-10-CM | POA: Diagnosis not present

## 2017-09-12 ENCOUNTER — Telehealth: Payer: Self-pay | Admitting: Internal Medicine

## 2017-09-12 ENCOUNTER — Other Ambulatory Visit: Payer: Self-pay

## 2017-09-12 MED ORDER — INSULIN NPH ISOPHANE & REGULAR (70-30) 100 UNIT/ML ~~LOC~~ SUSP: 20.0000 [IU] | Freq: Three times a day (TID) | SUBCUTANEOUS | 0 refills | Status: DC

## 2017-09-12 NOTE — Telephone Encounter (Signed)
Submitted

## 2017-09-12 NOTE — Telephone Encounter (Signed)
[facility] stated Patient need a refill of  Novolin 70/30 [phone]

## 2017-09-14 ENCOUNTER — Ambulatory Visit (INDEPENDENT_AMBULATORY_CARE_PROVIDER_SITE_OTHER): Payer: Medicare Other | Admitting: Orthopedic Surgery

## 2017-09-14 ENCOUNTER — Encounter (HOSPITAL_BASED_OUTPATIENT_CLINIC_OR_DEPARTMENT_OTHER): Payer: Medicare Other

## 2017-09-25 ENCOUNTER — Ambulatory Visit (INDEPENDENT_AMBULATORY_CARE_PROVIDER_SITE_OTHER): Payer: Medicare Other | Admitting: Orthopedic Surgery

## 2017-09-25 ENCOUNTER — Encounter (INDEPENDENT_AMBULATORY_CARE_PROVIDER_SITE_OTHER): Payer: Self-pay | Admitting: Orthopedic Surgery

## 2017-09-25 DIAGNOSIS — M12812 Other specific arthropathies, not elsewhere classified, left shoulder: Secondary | ICD-10-CM | POA: Diagnosis not present

## 2017-09-25 DIAGNOSIS — M12811 Other specific arthropathies, not elsewhere classified, right shoulder: Secondary | ICD-10-CM | POA: Diagnosis not present

## 2017-09-25 MED ORDER — METHYLPREDNISOLONE ACETATE 40 MG/ML IJ SUSP
40.0000 mg | INTRAMUSCULAR | Status: AC | PRN
  Administered 2017-09-25: 40 mg via INTRA_ARTICULAR

## 2017-09-25 MED ORDER — LIDOCAINE HCL 1 % IJ SOLN
5.0000 mL | INTRAMUSCULAR | Status: AC | PRN
  Administered 2017-09-25: 5 mL

## 2017-09-25 NOTE — Progress Notes (Signed)
Office Visit Note   Patient: Edward [name]           Date of Birth: Dec 13, 1940           MRN: [id number] Visit Date: 09/25/2017              Requested by: [address] [place], [place] [address] [place], [place] [address] PCP: Carollee Herter, Alferd Apa, DO  No chief complaint on file.     HPI: The patient is a 76 year old gentleman who presents today for complaining of chronic bilateral shoulder pain, right worse than left. Is requesting repeat left shoulder injection with cortisone. States this is worked well for him in the past, last injection provided 3 months of relief. Has had difficulty with ADLs due to pain and loss of rom. Had difficulty cleaning kitchen this am.  Assessment & Plan: Visit Diagnoses:  1. Rotator cuff arthropathy of both shoulders     Plan: Bilateral Injection today. He will call Malachy Mood the arthroscopic debridement of the left shoulder at his convenience. States that he prefers October of November of this coming year.  Follow-Up Instructions: Return if symptoms worsen or fail to improve.   Right Shoulder Exam   Tenderness  The patient is experiencing tenderness in the biceps tendon.  Range of Motion  Active abduction: 70  Passive abduction: 80   Tests  Impingement: positive   Left Shoulder Exam   Tenderness  The patient is experiencing tenderness in the biceps tendon.  Range of Motion  Active abduction: 70  Passive abduction: 80   Tests  Impingement: positive  Other  Erythema: absent Sensation: normal       Patient is alert, oriented, no adenopathy, well-dressed, normal affect, normal respiratory effort.  Imaging: No results found.  Labs: Lab Results  Component Value Date   HGBA1C 9.8 (H) 06/06/2017   HGBA1C 7.7 12/16/2016   HGBA1C 9.4 09/13/2016   REPTSTATUS 04/16/2014 FINAL 04/16/2014   CULT  04/16/2014    NO GROWTH 5 DAYS Performed at [facility] Multiple organisms present,each less  than 07/05/2016   LABORGA 10,000 CFU/mL. 07/05/2016   LABORGA These organisms,commonly found on external 07/05/2016   LABORGA and internal genitalia,are considered colonizers. 07/05/2016   LABORGA No further testing performed. 07/05/2016    Orders:  No orders of the defined types were placed in this encounter.  No orders of the defined types were placed in this encounter.    Procedures: Large Joint Inj: bilateral subacromial bursa on 09/25/2017 10:31 AM Indications: pain Details: 22 G 1.5 in needle Medications (Right): 5 mL lidocaine 1 %; 40 mg methylPREDNISolone acetate 40 MG/ML Medications (Left): 5 mL lidocaine 1 %; 40 mg methylPREDNISolone acetate 40 MG/ML Consent was given by the patient.      Clinical Data: No additional findings.  ROS:  All other systems negative, except as noted in the HPI. Review of Systems  Constitutional: Negative for chills and fever.  Respiratory: Negative for chest tightness and shortness of breath.   Cardiovascular: Negative for chest pain.  Musculoskeletal: Positive for arthralgias.    Objective: Vital Signs: There were no vitals taken for this visit.  Specialty Comments:  No specialty comments available.  PMFS History: Patient Active Problem List   Diagnosis Date Noted  . Anxiety 07/24/2017  . Coronary artery disease   . Atelectasis   . Chest pain 07/15/2017  . Coronary artery disease involving coronary bypass graft of native heart  without angina pectoris   . 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 Hypertensive heart disease without heart failure   . Chronic kidney disease, stage III (moderate) ([place]) 11/03/2016  . Acute on chronic diastolic heart failure ([place])   . Flank pain, acute 07/06/2016  . Hypokalemia 04/19/2016  . Near syncope 04/19/2016  . Diabetes mellitus ([place]) 03/11/2016  . GERD (gastroesophageal reflux disease) 08/27/2015  . Acute bronchitis 08/27/2015  . SOB (shortness of breath) 07/08/2015  . Obstructive sleep apnea  06/22/2015  . Bronchospasm 06/22/2015  . Essential tremor 04/23/2015  . Rotator cuff arthropathy 07/30/2014  . CAP (community acquired pneumonia) 04/16/2014  . Severe obesity (BMI >= 40) ([place]) 02/11/2014  . Hyperlipidemia LDL goal <70 01/29/2014  . Essential hypertension 01/29/2014   Past Medical History:  Diagnosis Date  . Adenomatous colon polyp 04/2011  . CAD (coronary artery disease)    a. 01/2015 DES to LAD  b. 12/2015: [place] 85% oLCx lesion--> rx therapy.  . Chronic diastolic CHF (congestive heart failure) ([place])    a. 05/2017 Echo: EF 60-65%, Gr1 DD, no rwma, mildly dil LA, nl RV fxn, mild TR.  . CKD (chronic kidney disease), stage II    a. probable CKD II-III with baseline CR 1.1-1.3.  . DM type 2 (diabetes mellitus, type 2), insulin dependent 01/29/2014   fasting cbg 50-120 with new regimen  . Dyslipidemia, goal LDL below 70 01/29/2014  . Enlarged prostate   . Essential tremor    a. on proprnolol  . GERD (gastroesophageal reflux disease)   . Hypertension   . Internal hemorrhoid   . Sleep apnea    does not use cpap (06/04/2015)  . TIA (transient ischemic attack) 2002    Family History  Problem Relation Age of Onset  . CAD Brother        2 brothers - CABG  . Alzheimer's disease Sister   . CAD Sister   . Prostate cancer Brother   . Asthma Brother        2 brothers   . Diabetes Other        entire family  . Colon cancer Neg Hx     Past Surgical History:  Procedure Laterality Date  . BACK SURGERY    . CARDIAC CATHETERIZATION  01/28/14   + CAD treat medically  . CARDIAC CATHETERIZATION N/A 12/21/2015   Procedure: Right/Left Heart Cath and Coronary Angiography;  Surgeon: Belva Crome, MD;  Location: [facility];  Service: Cardiovascular;  Laterality: N/A;  . CARDIAC CATHETERIZATION N/A 11/02/2016   Procedure: Left Heart Cath and Coronary Angiography;  Surgeon: Nelva Bush, MD;  Location: [facility];  Service: Cardiovascular;  Laterality: N/A;  . CATARACT  EXTRACTION Bilateral   . CATARACT EXTRACTION, BILATERAL Bilateral   . CORONARY ANGIOPLASTY WITH STENT PLACEMENT  01/30/2015   DES Promus  [place] to LAD by Dr Tamala Julian  . CORONARY ARTERY BYPASS GRAFT N/A 11/09/2016   Procedure: CORONARY ARTERY BYPASS GRAFTING (CABG) x 2;  Surgeon: Melrose Nakayama, MD;  Location: [facility];  Service: Open Heart Surgery;  Laterality: N/A;  . ENDOVEIN HARVEST OF GREATER SAPHENOUS VEIN Left 11/09/2016   Procedure: ENDOVEIN HARVEST OF GREATER SAPHENOUS VEIN;  Surgeon: Melrose Nakayama, MD;  Location: [facility];  Service: Open Heart Surgery;  Laterality: Left;  . JOINT REPLACEMENT    . LEFT HEART CATHETERIZATION WITH CORONARY ANGIOGRAM N/A 01/28/2014   Procedure: LEFT HEART CATHETERIZATION WITH CORONARY ANGIOGRAM;  Surgeon: Belva Crome  III, MD;  Location: [facility];  Service: Cardiovascular;  Laterality: N/A;  . LEFT HEART CATHETERIZATION WITH CORONARY ANGIOGRAM N/A 01/30/2015   Procedure: LEFT HEART CATHETERIZATION WITH CORONARY ANGIOGRAM;  Surgeon: Belva Crome, MD;  Location: [facility] C[facility];  Service: Cardiovascular;  Laterality: N/A;  . LUMBAR [place]    . SHOULDER ARTHROSCOPY Right 07/30/2014   Procedure: Right Shoulder Arthroscopy, Debridement, Decompression, Manipulation Under Anesthesia;  Surgeon: Newt Minion, MD;  Location: [facility];  Service: Orthopedics;  Laterality: Right;  . TEE WITHOUT CARDIOVERSION N/A 11/09/2016   Procedure: TRANSESOPHAGEAL ECHOCARDIOGRAM (TEE);  Surgeon: Melrose Nakayama, MD;  Location: [facility];  Service: Open Heart Surgery;  Laterality: N/A;  . TOTAL KNEE ARTHROPLASTY Bilateral    Social History   Occupational History  . Occupation: retired  Tobacco Use  . Smoking status: Never Smoker  . Smokeless tobacco: Never Used  Substance and Sexual Activity  . Alcohol use: No    Alcohol/week: 0.0 oz  . Drug use: No  . Sexual activity: No

## 2017-10-02 ENCOUNTER — Other Ambulatory Visit: Payer: Self-pay

## 2017-10-02 ENCOUNTER — Emergency Department (HOSPITAL_COMMUNITY)
Admission: EM | Admit: 2017-10-02 | Discharge: 2017-10-02 | Disposition: A | Discharge status: Home / Self Care | Payer: Medicare Other | Attending: Emergency Medicine | Admitting: Emergency Medicine

## 2017-10-02 ENCOUNTER — Encounter (HOSPITAL_COMMUNITY): Payer: Self-pay | Admitting: Emergency Medicine

## 2017-10-02 ENCOUNTER — Emergency Department (HOSPITAL_COMMUNITY): Payer: Medicare Other

## 2017-10-02 DIAGNOSIS — S8002XA Contusion of left knee, initial encounter: Secondary | ICD-10-CM | POA: Insufficient documentation

## 2017-10-02 DIAGNOSIS — Z7902 Long term (current) use of antithrombotics/antiplatelets: Secondary | ICD-10-CM | POA: Insufficient documentation

## 2017-10-02 DIAGNOSIS — M79642 Pain in left hand: Secondary | ICD-10-CM | POA: Diagnosis not present

## 2017-10-02 DIAGNOSIS — I130 Hypertensive heart and chronic kidney disease with heart failure and stage 1 through stage 4 chronic kidney disease, or unspecified chronic kidney disease: Secondary | ICD-10-CM | POA: Diagnosis not present

## 2017-10-02 DIAGNOSIS — Z79899 Other long term (current) drug therapy: Secondary | ICD-10-CM | POA: Diagnosis not present

## 2017-10-02 DIAGNOSIS — Y999 Unspecified external cause status: Secondary | ICD-10-CM | POA: Insufficient documentation

## 2017-10-02 DIAGNOSIS — S8001XA Contusion of right knee, initial encounter: Secondary | ICD-10-CM | POA: Insufficient documentation

## 2017-10-02 DIAGNOSIS — I2510 Atherosclerotic heart disease of native coronary artery without angina pectoris: Secondary | ICD-10-CM | POA: Diagnosis not present

## 2017-10-02 DIAGNOSIS — R0789 Other chest pain: Secondary | ICD-10-CM | POA: Insufficient documentation

## 2017-10-02 DIAGNOSIS — S8000XA Contusion of unspecified knee, initial encounter: Secondary | ICD-10-CM

## 2017-10-02 DIAGNOSIS — Z7982 Long term (current) use of aspirin: Secondary | ICD-10-CM | POA: Diagnosis not present

## 2017-10-02 DIAGNOSIS — R0781 Pleurodynia: Secondary | ICD-10-CM | POA: Diagnosis not present

## 2017-10-02 DIAGNOSIS — Z8673 Personal history of transient ischemic attack (TIA), and cerebral infarction without residual deficits: Secondary | ICD-10-CM | POA: Diagnosis not present

## 2017-10-02 DIAGNOSIS — Z951 Presence of aortocoronary bypass graft: Secondary | ICD-10-CM | POA: Insufficient documentation

## 2017-10-02 DIAGNOSIS — Z794 Long term (current) use of insulin: Secondary | ICD-10-CM | POA: Insufficient documentation

## 2017-10-02 DIAGNOSIS — S61315A Laceration without foreign body of left ring finger with damage to nail, initial encounter: Secondary | ICD-10-CM | POA: Diagnosis not present

## 2017-10-02 DIAGNOSIS — E1122 Type 2 diabetes mellitus with diabetic chronic kidney disease: Secondary | ICD-10-CM | POA: Insufficient documentation

## 2017-10-02 DIAGNOSIS — Y929 Unspecified place or not applicable: Secondary | ICD-10-CM | POA: Diagnosis not present

## 2017-10-02 DIAGNOSIS — N182 Chronic kidney disease, stage 2 (mild): Secondary | ICD-10-CM | POA: Diagnosis not present

## 2017-10-02 DIAGNOSIS — S8991XA Unspecified injury of right lower leg, initial encounter: Secondary | ICD-10-CM | POA: Diagnosis not present

## 2017-10-02 DIAGNOSIS — S60415A Abrasion of left ring finger, initial encounter: Secondary | ICD-10-CM | POA: Diagnosis not present

## 2017-10-02 DIAGNOSIS — Z23 Encounter for immunization: Secondary | ICD-10-CM | POA: Insufficient documentation

## 2017-10-02 DIAGNOSIS — M25562 Pain in left knee: Secondary | ICD-10-CM | POA: Diagnosis not present

## 2017-10-02 DIAGNOSIS — S6992XA Unspecified injury of left wrist, hand and finger(s), initial encounter: Secondary | ICD-10-CM | POA: Diagnosis not present

## 2017-10-02 DIAGNOSIS — W108XXA Fall (on) (from) other stairs and steps, initial encounter: Secondary | ICD-10-CM | POA: Insufficient documentation

## 2017-10-02 DIAGNOSIS — I5032 Chronic diastolic (congestive) heart failure: Secondary | ICD-10-CM | POA: Insufficient documentation

## 2017-10-02 DIAGNOSIS — M25561 Pain in right knee: Secondary | ICD-10-CM | POA: Diagnosis not present

## 2017-10-02 DIAGNOSIS — Y9301 Activity, walking, marching and hiking: Secondary | ICD-10-CM | POA: Insufficient documentation

## 2017-10-02 DIAGNOSIS — Z96653 Presence of artificial knee joint, bilateral: Secondary | ICD-10-CM | POA: Diagnosis not present

## 2017-10-02 DIAGNOSIS — S299XXA Unspecified injury of thorax, initial encounter: Secondary | ICD-10-CM | POA: Diagnosis not present

## 2017-10-02 DIAGNOSIS — S8992XA Unspecified injury of left lower leg, initial encounter: Secondary | ICD-10-CM | POA: Diagnosis not present

## 2017-10-02 LAB — CBG MONITORING, ED: GLUCOSE-CAPILLARY: 143 mg/dL — AB (ref 65–99)

## 2017-10-02 MED ORDER — TRAMADOL HCL 50 MG PO TABS
50.0000 mg | ORAL_TABLET | Freq: Once | ORAL | Status: AC
  Administered 2017-10-02: 50 mg via ORAL
  Filled 2017-10-02: qty 1

## 2017-10-02 MED ORDER — TRAMADOL HCL 50 MG PO TABS: 50.0000 mg | ORAL_TABLET | Freq: Four times a day (QID) | ORAL | 0 refills | Status: DC | PRN

## 2017-10-02 MED ORDER — TETANUS-DIPHTH-ACELL PERTUSSIS 5-2.5-18.5 LF-MCG/0.5 IM SUSP
0.5000 mL | Freq: Once | INTRAMUSCULAR | Status: AC
  Administered 2017-10-02: 0.5 mL via INTRAMUSCULAR
  Filled 2017-10-02: qty 0.5

## 2017-10-02 NOTE — ED Notes (Addendum)
Pt states he fell yesterday, and had an episode of chest pain this am, took 1 NTG this am and pain was relieved, no pain in chest at present. Pt does have bruising to both thighs, and c/o rib pain on left side.  Pt states that he did not take any of his meds this am.  Pt's cardiologist is Daneen Schick

## 2017-10-02 NOTE — ED Provider Notes (Signed)
[facility] EMERGENCY DEPARTMENT Provider Note   CSN: [id number] Arrival date & time: 10/02/17  2694     History   Chief Complaint Chief Complaint  Patient presents with  . Fall  . Chest Pain    HPI Edward [name] is a 76 y.o. male.  HPI Patient presents to the emergency room for evaluation of rib pain, bilateral knee pain, and a fingertip injury on his left hand.  Patient was walking when he slipped on concrete yesterday.  He fell injuring his left ribs, his left hand and his bilateral thighs.  Patient states he has been having some pain in his rib area intermittently.  He did have an episode this morning.  He thinks it was probably just his ribs but he took a nitroglycerin as a precaution.  He is denying any trouble with any chest pain or shortness of breath currently.  Did injure the fingernail of the fingertip of his left ring finger.  He was able to control the bleeding with the bandage.  He has pain above both knees where he has noticed some bruising.  He has able to walk.  He did not hit his head or lose consciousness.  No complaints of abdominal pain vomiting or shortness of breath Past Medical History:  Diagnosis Date  . Adenomatous colon polyp 04/2011  . CAD (coronary artery disease)    a. 01/2015 DES to LAD  b. 12/2015: [place] 85% oLCx lesion--> rx therapy.  . Chronic diastolic CHF (congestive heart failure) ([place])    a. 05/2017 Echo: EF 60-65%, Gr1 DD, no rwma, mildly dil LA, nl RV fxn, mild TR.  . CKD (chronic kidney disease), stage II    a. probable CKD II-III with baseline CR 1.1-1.3.  . DM type 2 (diabetes mellitus, type 2), insulin dependent 01/29/2014   fasting cbg 50-120 with new regimen  . Dyslipidemia, goal LDL below 70 01/29/2014  . Enlarged prostate   . Essential tremor    a. on proprnolol  . GERD (gastroesophageal reflux disease)   . Hypertension   . Internal hemorrhoid   . Sleep apnea    does not use cpap (06/04/2015)  . TIA (transient ischemic  attack) 2002    Patient Active Problem List   Diagnosis Date Noted  . Anxiety 07/24/2017  . Coronary artery disease   . Atelectasis   . Chest pain 07/15/2017  . Coronary artery disease involving coronary bypass graft of native heart without angina pectoris   . 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 Hypertensive heart disease without heart failure   . Chronic kidney disease, stage III (moderate) ([place]) 11/03/2016  . Acute on chronic diastolic heart failure ([place])   . Flank pain, acute 07/06/2016  . Hypokalemia 04/19/2016  . Near syncope 04/19/2016  . Diabetes mellitus ([place]) 03/11/2016  . GERD (gastroesophageal reflux disease) 08/27/2015  . Acute bronchitis 08/27/2015  . SOB (shortness of breath) 07/08/2015  . Obstructive sleep apnea 06/22/2015  . Bronchospasm 06/22/2015  . Essential tremor 04/23/2015  . Rotator cuff arthropathy 07/30/2014  . CAP (community acquired pneumonia) 04/16/2014  . Severe obesity (BMI >= 40) ([place]) 02/11/2014  . Hyperlipidemia LDL goal <70 01/29/2014  . Essential hypertension 01/29/2014    Past Surgical History:  Procedure Laterality Date  . BACK SURGERY    . CARDIAC CATHETERIZATION  01/28/14   + CAD treat medically  . CARDIAC CATHETERIZATION N/A 12/21/2015   Procedure: Right/Left Heart Cath and Coronary Angiography;  Surgeon: Lynnell Dike  Tamala Julian, MD;  Location: [facility];  Service: Cardiovascular;  Laterality: N/A;  . CARDIAC CATHETERIZATION N/A 11/02/2016   Procedure: Left Heart Cath and Coronary Angiography;  Surgeon: Nelva Bush, MD;  Location: [facility];  Service: Cardiovascular;  Laterality: N/A;  . CATARACT EXTRACTION Bilateral   . CATARACT EXTRACTION, BILATERAL Bilateral   . CORONARY ANGIOPLASTY WITH STENT PLACEMENT  01/30/2015   DES [place]  [place] to LAD by Dr Tamala Julian  . CORONARY ARTERY BYPASS GRAFT N/A 11/09/2016   Procedure: CORONARY ARTERY BYPASS GRAFTING (CABG) x 2;  Surgeon: Melrose Nakayama, MD;  Location: [facility];  Service: Open Heart  Surgery;  Laterality: N/A;  . ENDOVEIN HARVEST OF GREATER SAPHENOUS VEIN Left 11/09/2016   Procedure: ENDOVEIN HARVEST OF GREATER SAPHENOUS VEIN;  Surgeon: Melrose Nakayama, MD;  Location: [facility];  Service: Open Heart Surgery;  Laterality: Left;  . JOINT REPLACEMENT    . LEFT HEART CATHETERIZATION WITH CORONARY ANGIOGRAM N/A 01/28/2014   Procedure: LEFT HEART CATHETERIZATION WITH CORONARY ANGIOGRAM;  Surgeon: Sinclair Grooms, MD;  Location: [facility] CATH LAB;  Service: Cardiovascular;  Laterality: N/A;  . LEFT HEART CATHETERIZATION WITH CORONARY ANGIOGRAM N/A 01/30/2015   Procedure: LEFT HEART CATHETERIZATION WITH CORONARY ANGIOGRAM;  Surgeon: Belva Crome, MD;  Location: [facility] CATH LAB;  Service: Cardiovascular;  Laterality: N/A;  . LUMBAR [place]    . SHOULDER ARTHROSCOPY Right 07/30/2014   Procedure: Right Shoulder Arthroscopy, Debridement, Decompression, Manipulation Under Anesthesia;  Surgeon: Newt Minion, MD;  Location: [facility];  Service: Orthopedics;  Laterality: Right;  . TEE WITHOUT CARDIOVERSION N/A 11/09/2016   Procedure: TRANSESOPHAGEAL ECHOCARDIOGRAM (TEE);  Surgeon: Melrose Nakayama, MD;  Location: [facility];  Service: Open Heart Surgery;  Laterality: N/A;  . TOTAL KNEE ARTHROPLASTY Bilateral        Home Medications    Prior to Admission medications   Medication Sig Start Date End Date Taking? Authorizing Provider  acetaminophen (TYLENOL) 500 MG tablet Take 2 tablets (1,000 mg total) by mouth every 6 (six) hours as needed. Patient taking differently: Take 1,000 mg by mouth every 6 (six) hours as needed for mild pain.  12/07/16   Leo Grosser, MD  albuterol (VENTOLIN HFA) 108 (90 Base) MCG/ACT inhaler inhale 2 puffs by mouth every 6 hours if needed for wheezing or shortness of breath 07/24/17   Carollee Herter, Alferd Apa, DO  amLODipine (NORVASC) 5 MG tablet Take 1 tablet (5 mg total) by mouth daily. 05/05/17 08/03/17  Belva Crome, MD  aspirin EC 81 MG tablet Take 81 mg by mouth daily.     [provider]  clonazePAM (KLONOPIN) 0.5 MG tablet Take 1 tablet (0.5 mg total) by mouth 2 (two) times daily as needed (tremors). 07/24/17   Ann Held, DO  clopidogrel (PLAVIX) 75 MG tablet Take 1 tablet (75 mg total) by mouth daily. 12/23/16   Belva Crome, MD  diclofenac sodium (VOLTAREN) 1 % GEL Apply 2 g topically 4 (four) times daily as needed (for pain).     [provider]  fluticasone furoate-vilanterol (BREO ELLIPTA) 100-25 MCG/INH AEPB Inhale 1 puff into the lungs daily. 07/24/17   Carollee Herter, Yvonne R, DO  fluticasone furoate-vilanterol (BREO ELLIPTA) 100-25 MCG/INH AEPB Inhale 1 puff into the lungs daily. 07/27/17   Magdalen Spatz, NP  furosemide (LASIX) 80 MG tablet Take 80 mg by mouth daily.    [provider]  gabapentin (NEURONTIN) 300 MG capsule  Take 1 capsule (300 mg total) by mouth 2 (two) times daily. 02/06/17   Geradine Girt, DO  gabapentin (NEURONTIN) 300 MG capsule take 1 capsule by mouth at bedtime for 1 week then INCREASE TO 1 CAP TWICE DAILY 08/22/17   Narda Amber K, DO  insulin NPH-regular Human (NOVOLIN 70/30) (70-30) 100 UNIT/ML injection Inject 20-40 Units 3 (three) times daily with meals into the skin. 30 units with Breakfast, 20 units with Lunch, 40 units with Dinner. 09/12/17   Philemon Kingdom, MD  insulin regular (HUMULIN R) 100 units/mL injection Inject up to 20 units 3 times daily with meals 09/04/17   Philemon Kingdom, MD  isosorbide mononitrate (IMDUR) 30 MG 24 hr tablet Take 15 mg by mouth daily. 07/17/17   [provider]  meclizine (ANTIVERT) 25 MG tablet Take 1 tablet (25 mg total) by mouth 2 (two) times daily as needed for dizziness. 06/10/17   Rogelia Mire, NP  nitroGLYCERIN (NITROSTAT) 0.4 MG SL tablet Place 1 tablet (0.4 mg total) under the tongue every 5 (five) minutes as needed for chest pain. Max: 3 doses per episode. 07/17/17   Hongalgi, Lenis Dickinson, MD  Polyvinyl Alcohol-Povidone (REFRESH OP) Place  1 drop into both eyes 3 (three) times daily as needed (for dry eyes). Reported on 12/25/2015    [provider]  potassium chloride SA (K-DUR,KLOR-CON) 20 MEQ tablet Take 1.5 tablets (30 mEq total) by mouth daily. 06/10/17   Rogelia Mire, NP  pravastatin (PRAVACHOL) 20 MG tablet Take 1 tablet (20 mg total) by mouth daily. 07/28/17   Ann Held, DO  propranolol (INDERAL) 40 MG tablet Take 40 mg by mouth 3 (three) times daily. 10/25/16   [provider]  propranolol (INDERAL) 40 MG tablet take 1 tablet by mouth three times a day 08/15/17   Narda Amber K, DO  psyllium (METAMUCIL) 58.6 % packet Take 1 packet by mouth daily as needed (constipation).    [provider]  tamsulosin (FLOMAX) 0.4 MG CAPS capsule Take 0.4 mg by mouth at bedtime. 05/30/17   [provider]  traMADol (ULTRAM) 50 MG tablet Take 1 tablet (50 mg total) by mouth every 6 (six) hours as needed. 10/02/17   Dorie Rank, MD    Family History Family History  Problem Relation Age of Onset  . CAD Brother        2 brothers - CABG  . Alzheimer's disease Sister   . CAD Sister   . Prostate cancer Brother   . Asthma Brother        2 brothers   . Diabetes Other        entire family  . Colon cancer Neg Hx     Social History Social History   Tobacco Use  . Smoking status: Never Smoker  . Smokeless tobacco: Never Used  Substance Use Topics  . Alcohol use: No    Alcohol/week: 0.0 oz  . Drug use: No     Allergies   Pneumococcal vaccines   Review of Systems Review of Systems  All other systems reviewed and are negative.    Physical Exam Updated Vital Signs BP 134/72 (BP Location: Left Arm)   Pulse 68   Temp 98.2 F (36.8 C) (Oral)   Resp 18   SpO2 99%   Physical Exam  Constitutional:  Non-toxic appearance. He does not appear ill. No distress.  HENT:  Head: Normocephalic and atraumatic.  Right Ear: External ear normal.  Left Ear:  External ear normal.  Eyes:  Conjunctivae are normal. Right eye exhibits no discharge. Left eye exhibits no discharge. No scleral icterus.  Neck: Neck supple. No tracheal deviation present.  Cardiovascular: Normal rate, regular rhythm and intact distal pulses.  Pulmonary/Chest: Effort normal and breath sounds normal. No stridor. No respiratory distress. He has no wheezes. He has no rales.  Mild ttp chest wall posteriorly, no crepitus  Abdominal: Soft. Bowel sounds are normal. He exhibits no distension. There is no tenderness. There is no rebound and no guarding.  Musculoskeletal: He exhibits no edema.       Right shoulder: He exhibits no tenderness, no bony tenderness and no swelling.       Left shoulder: He exhibits no tenderness, no bony tenderness and no swelling.       Right wrist: He exhibits no tenderness, no bony tenderness and no swelling.       Left wrist: He exhibits no tenderness, no bony tenderness and no swelling.       Right hip: He exhibits normal range of motion, no tenderness, no bony tenderness and no swelling.       Left hip: He exhibits normal range of motion, no tenderness and no bony tenderness.       Right knee: Tenderness found.       Left knee: Tenderness found.       Right ankle: He exhibits no swelling. No tenderness.       Left ankle: He exhibits no swelling. No tenderness.       Cervical back: He exhibits no tenderness, no bony tenderness and no swelling.       Thoracic back: He exhibits no tenderness, no bony tenderness and no swelling.       Lumbar back: He exhibits no tenderness, no bony tenderness and no swelling.       Left hand: He exhibits tenderness ( Laceration of the fingertip of the left ring finger that transects the nailbed, no active bleeding).  Tenderness and bruising noted proximal to bilateral knees, no gross deformity  Neurological: He is alert. He has normal strength. No cranial nerve deficit (no facial droop, extraocular movements intact, no slurred speech) or sensory  deficit. He exhibits normal muscle tone. He displays no seizure activity. Coordination normal.  Skin: Skin is warm and dry. No rash noted.  Psychiatric: He has a normal mood and affect.  Nursing note and vitals reviewed.    ED Treatments / Results  Labs (all labs ordered are listed, but only abnormal results are displayed) Labs Reviewed  CBG MONITORING, ED - Abnormal; Notable for the following components:      Result Value   Glucose-Capillary 143 (*)    All other components within normal limits     Radiology Dg Chest 2 View  Result Date: 10/02/2017 CLINICAL DATA:  Slipped on wet concrete stairs yesterday, struck RIGHT thigh, LEFT ribs and LEFT hand, laceration ring finger, pain in knees and ribs EXAM: CHEST  2 VIEW COMPARISON:  07/16/2017 FINDINGS: Enlargement of cardiac silhouette post CABG. Mediastinal contours and pulmonary vascularity normal. Slightly decreased lung volumes with minimal scarring at bases. No acute infiltrate, pleural effusion or pneumothorax. BILATERAL glenohumeral degenerative changes. No acute bony findings. IMPRESSION: Enlargement of cardiac silhouette post CABG. Bibasilar scarring without acute abnormalities. Electronically Signed   By: Lavonia Dana M.D.   On: 10/02/2017 13:27   Dg Knee Complete 4 Views Left  Result Date: 10/02/2017 CLINICAL DATA:  Slipped on wet concrete stairs yesterday,  struck RIGHT thigh, LEFT ribs and LEFT hand, laceration ring finger, pain in knees and ribs EXAM: LEFT KNEE - COMPLETE 4+ VIEW COMPARISON:  12/22/2013 FINDINGS: Components of LEFT knee prosthesis. Osseous mineralization normal. No periprosthetic lucency. Joint alignment normal. No acute fracture, dislocation, or bone destruction. No knee joint effusion. IMPRESSION: No acute osseous abnormalities. Unchanged appearance of LEFT knee prosthesis. Electronically Signed   By: Lavonia Dana M.D.   On: 10/02/2017 13:26   Dg Knee Complete 4 Views Right  Result Date: 10/02/2017 CLINICAL  DATA:  Slipped on wet concrete stairs yesterday, struck RIGHT thigh, LEFT ribs and LEFT hand, laceration ring finger, pain in knees and ribs EXAM: RIGHT KNEE - COMPLETE 4+ VIEW COMPARISON:  None FINDINGS: Components of RIGHT knee prosthesis. Osseous mineralization normal. Narrowing of joint space between metallic components of prosthesis consistent with liner wear. No acute fracture, dislocation, or bone destruction. No knee joint effusion. Surgical clips at medial aspect of RIGHT knee. No periprosthetic lucency. IMPRESSION: RIGHT knee prosthesis without evidence of liner wear. No acute bony abnormalities. Electronically Signed   By: Lavonia Dana M.D.   On: 10/02/2017 13:24   Dg Hand Complete Left  Result Date: 10/02/2017 CLINICAL DATA:  Slipped on wet concrete stairs yesterday, struck RIGHT thigh, LEFT ribs and LEFT hand, laceration ring finger, pain in knees and ribs EXAM: LEFT HAND - COMPLETE 3+ VIEW COMPARISON:  None FINDINGS: Osseous mineralization normal. Joint spaces preserved. No acute fracture, dislocation, or bone destruction. IMPRESSION: No acute osseous abnormalities. Electronically Signed   By: Lavonia Dana M.D.   On: 10/02/2017 13:24    Procedures Procedures (including critical care time) Fingernail laceration cleaned with normal saline.  Steri-Strips applied to the nail, sterile dressing applied. Medications Ordered in ED Medications  traMADol (ULTRAM) tablet 50 mg (not administered)  Tdap (BOOSTRIX) injection 0.5 mL (0.5 mLs Intramuscular Given 10/02/17 1400)     Initial Impression / Assessment and Plan / ED Course  I have reviewed the triage vital signs and the nursing notes.  Pertinent labs & imaging results that were available during my care of the patient were reviewed by me and considered in my medical decision making (see chart for details).    No evidence of serious injury associated with the fallt.  Consistent with soft tissue injury/strain.  Explained findings to patient  and warning signs that should prompt return to the ED.   Final Clinical Impressions(s) / ED Diagnoses   Final diagnoses:  Contusion of knee, unspecified laterality, initial encounter    ED Discharge Orders        Ordered    traMADol (ULTRAM) 50 MG tablet  Every 6 hours PRN     10/02/17 1405       Dorie Rank, MD 10/02/17 1407

## 2017-10-02 NOTE — Discharge Instructions (Signed)
You can take over-the-counter medications as needed for pain, apply ice to help with the swelling

## 2017-10-02 NOTE — ED Triage Notes (Signed)
Patient slipped on a wet concret stair yesterday, hitting his right thigh, left ribs, and left hand. Small bruise on left thigh, no bruise on ribs but tender to palpation, bleeding controlled on left ring finger. Denies loss of consciousness.

## 2017-10-03 ENCOUNTER — Telehealth: Payer: Self-pay | Admitting: *Deleted

## 2017-10-03 NOTE — Telephone Encounter (Signed)
Faxed

## 2017-10-03 NOTE — Telephone Encounter (Signed)
Erline Levine calling from [employer] is calling stating that they need most recent OV faxed over to them on this patient. The fax number is [phone]. Marland Kitchen Please advise. Thank you

## 2017-10-11 ENCOUNTER — Other Ambulatory Visit: Payer: Self-pay

## 2017-10-11 MED ORDER — INSULIN NPH ISOPHANE & REGULAR (70-30) 100 UNIT/ML ~~LOC~~ SUSP: 20.0000 [IU] | Freq: Three times a day (TID) | SUBCUTANEOUS | 0 refills | Status: DC

## 2017-10-13 ENCOUNTER — Encounter (HOSPITAL_COMMUNITY): Payer: Self-pay | Admitting: Emergency Medicine

## 2017-10-13 ENCOUNTER — Emergency Department (HOSPITAL_COMMUNITY): Payer: Medicare Other

## 2017-10-13 ENCOUNTER — Emergency Department (HOSPITAL_COMMUNITY)
Admission: EM | Admit: 2017-10-13 | Discharge: 2017-10-13 | Disposition: A | Discharge status: Home / Self Care | Payer: Medicare Other | Attending: Emergency Medicine | Admitting: Emergency Medicine

## 2017-10-13 ENCOUNTER — Other Ambulatory Visit: Payer: Self-pay

## 2017-10-13 DIAGNOSIS — Z79899 Other long term (current) drug therapy: Secondary | ICD-10-CM | POA: Diagnosis not present

## 2017-10-13 DIAGNOSIS — N182 Chronic kidney disease, stage 2 (mild): Secondary | ICD-10-CM | POA: Diagnosis not present

## 2017-10-13 DIAGNOSIS — Z7902 Long term (current) use of antithrombotics/antiplatelets: Secondary | ICD-10-CM | POA: Insufficient documentation

## 2017-10-13 DIAGNOSIS — R072 Precordial pain: Secondary | ICD-10-CM

## 2017-10-13 DIAGNOSIS — R52 Pain, unspecified: Secondary | ICD-10-CM | POA: Diagnosis not present

## 2017-10-13 DIAGNOSIS — I5032 Chronic diastolic (congestive) heart failure: Secondary | ICD-10-CM | POA: Diagnosis not present

## 2017-10-13 DIAGNOSIS — R079 Chest pain, unspecified: Secondary | ICD-10-CM | POA: Diagnosis not present

## 2017-10-13 DIAGNOSIS — M25552 Pain in left hip: Secondary | ICD-10-CM | POA: Diagnosis not present

## 2017-10-13 DIAGNOSIS — Z7982 Long term (current) use of aspirin: Secondary | ICD-10-CM | POA: Diagnosis not present

## 2017-10-13 DIAGNOSIS — E119 Type 2 diabetes mellitus without complications: Secondary | ICD-10-CM | POA: Diagnosis not present

## 2017-10-13 DIAGNOSIS — S79912A Unspecified injury of left hip, initial encounter: Secondary | ICD-10-CM | POA: Diagnosis not present

## 2017-10-13 DIAGNOSIS — I130 Hypertensive heart and chronic kidney disease with heart failure and stage 1 through stage 4 chronic kidney disease, or unspecified chronic kidney disease: Secondary | ICD-10-CM | POA: Insufficient documentation

## 2017-10-13 DIAGNOSIS — I2510 Atherosclerotic heart disease of native coronary artery without angina pectoris: Secondary | ICD-10-CM | POA: Diagnosis not present

## 2017-10-13 LAB — CBC
HCT: 38.5 % — ABNORMAL LOW (ref 39.0–52.0)
Hemoglobin: 12.8 g/dL — ABNORMAL LOW (ref 13.0–17.0)
MCH: 28.8 pg (ref 26.0–34.0)
MCHC: 33.2 g/dL (ref 30.0–36.0)
MCV: 86.7 fL (ref 78.0–100.0)
PLATELETS: 216 10*3/uL (ref 150–400)
RBC: 4.44 MIL/uL (ref 4.22–5.81)
RDW: 13.2 % (ref 11.5–15.5)
WBC: 7.9 10*3/uL (ref 4.0–10.5)

## 2017-10-13 LAB — BASIC METABOLIC PANEL
Anion gap: 8 (ref 5–15)
BUN: 10 mg/dL (ref 6–20)
CALCIUM: 8.6 mg/dL — AB (ref 8.9–10.3)
CHLORIDE: 103 mmol/L (ref 101–111)
CO2: 28 mmol/L (ref 22–32)
CREATININE: 1.21 mg/dL (ref 0.61–1.24)
GFR calc Af Amer: >60 mL/min (ref >60)
GFR calc non Af Amer: 57 mL/min — ABNORMAL LOW (ref >60)
GLUCOSE: 157 mg/dL — AB (ref 65–99)
Potassium: 3.8 mmol/L (ref 3.5–5.1)
Sodium: 139 mmol/L (ref 135–145)

## 2017-10-13 LAB — HEPATIC FUNCTION PANEL
ALT: 17 U/L (ref 17–63)
AST: 21 U/L (ref 15–41)
Albumin: 3.3 g/dL — ABNORMAL LOW (ref 3.5–5.0)
Alkaline Phosphatase: 93 U/L (ref 38–126)
BILIRUBIN DIRECT: 0.2 mg/dL (ref 0.1–0.5)
BILIRUBIN INDIRECT: 0.6 mg/dL (ref 0.3–0.9)
TOTAL PROTEIN: 6.4 g/dL — AB (ref 6.5–8.1)
Total Bilirubin: 0.8 mg/dL (ref 0.3–1.2)

## 2017-10-13 LAB — I-STAT TROPONIN, ED: TROPONIN I, POC: 0 ng/mL (ref 0.00–0.08)

## 2017-10-13 LAB — BRAIN NATRIURETIC PEPTIDE: B Natriuretic Peptide: 27.3 pg/mL (ref 0.0–100.0)

## 2017-10-13 LAB — LIPASE, BLOOD: LIPASE: 17 U/L (ref 11–51)

## 2017-10-13 MED ORDER — HYDROCODONE-ACETAMINOPHEN 5-325 MG PO TABS: 1.0000 | ORAL_TABLET | ORAL | 0 refills | Status: DC | PRN

## 2017-10-13 MED ORDER — FENTANYL CITRATE (PF) 100 MCG/2ML IJ SOLN
50.0000 ug | Freq: Once | INTRAMUSCULAR | Status: AC
  Administered 2017-10-13: 50 ug via INTRAVENOUS
  Filled 2017-10-13: qty 2

## 2017-10-13 MED ORDER — HYDROCODONE-ACETAMINOPHEN 5-325 MG PO TABS
1.0000 | ORAL_TABLET | Freq: Once | ORAL | Status: AC
  Administered 2017-10-13: 1 via ORAL
  Filled 2017-10-13: qty 1

## 2017-10-13 NOTE — Discharge Instructions (Signed)
We suspect that you have muscular skeletal pain based on the reassuring workup you had today.  Please use the medicine to help with your discomfort and follow-up with your primary care physician in the next several days.  If any symptoms change or worsen, please return to the nearest emergency department.

## 2017-10-13 NOTE — ED Notes (Signed)
Pt given water to drink per Velna Hatchet, ,RN

## 2017-10-13 NOTE — ED Provider Notes (Signed)
[facility] EMERGENCY DEPARTMENT Provider Note   CSN: [id number] Arrival date & time: 10/13/17  1002     History   Chief Complaint Chief Complaint  Patient presents with  . Chest Pain    HPI Edward [name] is a 76 y.o. male.  The history is provided by the patient and medical records. No language interpreter was used.  Chest Pain   This is a new problem. The current episode started more than 2 days ago. The problem occurs constantly. The problem has been gradually worsening. The pain is associated with raising an arm and lifting. The pain is present in the substernal region and lateral region. The pain is at a severity of 7/10. The pain is moderate. The quality of the pain is described as sharp. The pain does not radiate. Duration of episode(s) is 1 week. The symptoms are aggravated by certain positions. Associated symptoms include lower extremity edema (bilateral, chronic). Pertinent negatives include no abdominal pain, no back pain, no claudication, no cough, no diaphoresis, no fever, no headaches, no irregular heartbeat, no leg pain, no malaise/fatigue, no nausea, no numbness, no palpitations, no shortness of breath, no sputum production, no syncope and no vomiting. He has tried nothing for the symptoms. The treatment provided no relief. Risk factors include male gender.  His past medical history is significant for CAD and CHF.    Past Medical History:  Diagnosis Date  . Adenomatous colon polyp 04/2011  . CAD (coronary artery disease)    a. 01/2015 DES to LAD  b. 12/2015: [place] 85% oLCx lesion--> rx therapy.  . Chronic diastolic CHF (congestive heart failure) ([place])    a. 05/2017 Echo: EF 60-65%, Gr1 DD, no rwma, mildly dil LA, nl RV fxn, mild TR.  . CKD (chronic kidney disease), stage II    a. probable CKD II-III with baseline CR 1.1-1.3.  . DM type 2 (diabetes mellitus, type 2), insulin dependent 01/29/2014   fasting cbg 50-120 with new regimen  . Dyslipidemia, goal  LDL below 70 01/29/2014  . Enlarged prostate   . Essential tremor    a. on proprnolol  . GERD (gastroesophageal reflux disease)   . Hypertension   . Internal hemorrhoid   . Sleep apnea    does not use cpap (06/04/2015)  . TIA (transient ischemic attack) 2002    Patient Active Problem List   Diagnosis Date Noted  . Anxiety 07/24/2017  . Coronary artery disease   . Atelectasis   . Chest pain 07/15/2017  . Coronary artery disease involving coronary bypass graft of native heart without angina pectoris   . 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 Hypertensive heart disease without heart failure   . Chronic kidney disease, stage III (moderate) ([place]) 11/03/2016  . Acute on chronic diastolic heart failure ([place])   . Flank pain, acute 07/06/2016  . Hypokalemia 04/19/2016  . Near syncope 04/19/2016  . Diabetes mellitus ([place]) 03/11/2016  . GERD (gastroesophageal reflux disease) 08/27/2015  . Acute bronchitis 08/27/2015  . SOB (shortness of breath) 07/08/2015  . Obstructive sleep apnea 06/22/2015  . Bronchospasm 06/22/2015  . Essential tremor 04/23/2015  . Rotator cuff arthropathy 07/30/2014  . CAP (community acquired pneumonia) 04/16/2014  . Severe obesity (BMI >= 40) ([place]) 02/11/2014  . Hyperlipidemia LDL goal <70 01/29/2014  . Essential hypertension 01/29/2014    Past Surgical History:  Procedure Laterality Date  . BACK SURGERY    . CARDIAC CATHETERIZATION  01/28/14   + CAD treat  medically  . CARDIAC CATHETERIZATION N/A 12/21/2015   Procedure: Right/Left Heart Cath and Coronary Angiography;  Surgeon: Belva Crome, MD;  Location: [facility] CV [facility];  Service: Cardiovascular;  Laterality: N/A;  . CARDIAC CATHETERIZATION N/A 11/02/2016   Procedure: Left Heart Cath and Coronary Angiography;  Surgeon: Nelva Bush, MD;  Location: [facility];  Service: Cardiovascular;  Laterality: N/A;  . CATARACT EXTRACTION Bilateral   . CATARACT EXTRACTION, BILATERAL Bilateral   . CORONARY ANGIOPLASTY WITH  STENT PLACEMENT  01/30/2015   DES [place]  [place] to LAD by Dr Tamala Julian  . CORONARY ARTERY BYPASS GRAFT N/A 11/09/2016   Procedure: CORONARY ARTERY BYPASS GRAFTING (CABG) x 2;  Surgeon: Melrose Nakayama, MD;  Location: [facility];  Service: Open Heart Surgery;  Laterality: N/A;  . ENDOVEIN HARVEST OF GREATER SAPHENOUS VEIN Left 11/09/2016   Procedure: ENDOVEIN HARVEST OF GREATER SAPHENOUS VEIN;  Surgeon: Melrose Nakayama, MD;  Location: [facility];  Service: Open Heart Surgery;  Laterality: Left;  . JOINT REPLACEMENT    . LEFT HEART CATHETERIZATION WITH CORONARY ANGIOGRAM N/A 01/28/2014   Procedure: LEFT HEART CATHETERIZATION WITH CORONARY ANGIOGRAM;  Surgeon: Sinclair Grooms, MD;  Location: [facility] CATH LAB;  Service: Cardiovascular;  Laterality: N/A;  . LEFT HEART CATHETERIZATION WITH CORONARY ANGIOGRAM N/A 01/30/2015   Procedure: LEFT HEART CATHETERIZATION WITH CORONARY ANGIOGRAM;  Surgeon: Belva Crome, MD;  Location[facility] C[facility] LAB;  Service: Cardiovascular;  Laterality: N/A;  . LUMBAR [place]    . SHOULDER ARTHROSCOPY Right 07/30/2014   Procedure: Right Shoulder Arthroscopy, Debridement, Decompression, Manipulation Under Anesthesia;  Surgeon: Newt Minion, MD;  Location: [facility];  Service: Orthopedics;  Laterality: Right;  . TEE WITHOUT CARDIOVERSION N/A 11/09/2016   Procedure: TRANSESOPHAGEAL ECHOCARDIOGRAM (TEE);  Surgeon: Melrose Nakayama, MD;  Location: [facility];  Service: Open Heart Surgery;  Laterality: N/A;  . TOTAL KNEE ARTHROPLASTY Bilateral        Home Medications    Prior to Admission medications   Medication Sig Start Date End Date Taking? Authorizing Provider  acetaminophen (TYLENOL) 500 MG tablet Take 2 tablets (1,000 mg total) by mouth every 6 (six) hours as needed. Patient taking differently: Take 1,000 mg by mouth every 6 (six) hours as needed for mild pain.  12/07/16   Leo Grosser, MD  albuterol (VENTOLIN HFA) 108 (90 Base) MCG/ACT inhaler inhale 2 puffs by mouth every 6 hours  if needed for wheezing or shortness of breath 07/24/17   Carollee Herter, Alferd Apa, DO  amLODipine (NORVASC) 5 MG tablet Take 1 tablet (5 mg total) by mouth daily. 05/05/17 08/03/17  Belva Crome, MD  aspirin EC 81 MG tablet Take 81 mg by mouth daily.    [provider]  clonazePAM (KLONOPIN) 0.5 MG tablet Take 1 tablet (0.5 mg total) by mouth 2 (two) times daily as needed (tremors). 07/24/17   Ann Held, DO  clopidogrel (PLAVIX) 75 MG tablet Take 1 tablet (75 mg total) by mouth daily. 12/23/16   Belva Crome, MD  diclofenac sodium (VOLTAREN) 1 % GEL Apply 2 g topically 4 (four) times daily as needed (for pain).     [provider]  fluticasone furoate-vilanterol (BREO ELLIPTA) 100-25 MCG/INH AEPB Inhale 1 puff into the lungs daily. 07/24/17   Carollee Herter, Yvonne R, DO  fluticasone furoate-vilanterol (BREO ELLIPTA) 100-25 MCG/INH AEPB Inhale 1 puff into the lungs daily. 07/27/17   Magdalen Spatz, NP  furosemide (LASIX) 80  MG tablet Take 80 mg by mouth daily.    [provider]  gabapentin (NEURONTIN) 300 MG capsule Take 1 capsule (300 mg total) by mouth 2 (two) times daily. 02/06/17   Geradine Girt, DO  gabapentin (NEURONTIN) 300 MG capsule take 1 capsule by mouth at bedtime for 1 week then INCREASE TO 1 CAP TWICE DAILY 08/22/17   Narda Amber K, DO  insulin NPH-regular Human (NOVOLIN 70/30) (70-30) 100 UNIT/ML injection Inject 20-40 Units into the skin 3 (three) times daily with meals. 30 units with Breakfast, 20 units with Lunch, 40 units with Dinner. 10/11/17   Philemon Kingdom, MD  insulin regular (HUMULIN R) 100 units/mL injection Inject up to 20 units 3 times daily with meals 09/04/17   Philemon Kingdom, MD  isosorbide mononitrate (IMDUR) 30 MG 24 hr tablet Take 15 mg by mouth daily. 07/17/17   [provider]  meclizine (ANTIVERT) 25 MG tablet Take 1 tablet (25 mg total) by mouth 2 (two) times daily as needed for dizziness. 06/10/17   Rogelia Mire,  NP  nitroGLYCERIN (NITROSTAT) 0.4 MG SL tablet Place 1 tablet (0.4 mg total) under the tongue every 5 (five) minutes as needed for chest pain. Max: 3 doses per episode. 07/17/17   Hongalgi, Lenis Dickinson, MD  Polyvinyl Alcohol-Povidone (REFRESH OP) Place 1 drop into both eyes 3 (three) times daily as needed (for dry eyes). Reported on 12/25/2015    [provider]  potassium chloride SA (K-DUR,KLOR-CON) 20 MEQ tablet Take 1.5 tablets (30 mEq total) by mouth daily. 06/10/17   Rogelia Mire, NP  pravastatin (PRAVACHOL) 20 MG tablet Take 1 tablet (20 mg total) by mouth daily. 07/28/17   Ann Held, DO  propranolol (INDERAL) 40 MG tablet Take 40 mg by mouth 3 (three) times daily. 10/25/16   [provider]  propranolol (INDERAL) 40 MG tablet take 1 tablet by mouth three times a day 08/15/17   Narda Amber K, DO  psyllium (METAMUCIL) 58.6 % packet Take 1 packet by mouth daily as needed (constipation).    [provider]  tamsulosin (FLOMAX) 0.4 MG CAPS capsule Take 0.4 mg by mouth at bedtime. 05/30/17   [provider]  traMADol (ULTRAM) 50 MG tablet Take 1 tablet (50 mg total) by mouth every 6 (six) hours as needed. 10/02/17   Dorie Rank, MD    Family History Family History  Problem Relation Age of Onset  . CAD Brother        2 brothers - CABG  . Alzheimer's disease Sister   . CAD Sister   . Prostate cancer Brother   . Asthma Brother        2 brothers   . Diabetes Other        entire family  . Colon cancer Neg Hx     Social History Social History   Tobacco Use  . Smoking status: Never Smoker  . Smokeless tobacco: Never Used  Substance Use Topics  . Alcohol use: No    Alcohol/week: 0.0 oz  . Drug use: No     Allergies   Pneumococcal vaccines   Review of Systems Review of Systems  Constitutional: Negative for chills, diaphoresis, fatigue, fever and malaise/fatigue.  HENT: Negative for congestion.   Eyes: Negative for visual  disturbance.  Respiratory: Negative for cough, sputum production, choking, chest tightness, shortness of breath, wheezing and stridor.   Cardiovascular: Positive for chest pain and leg swelling (bilateral, chronic). Negative for palpitations,  claudication and syncope.  Gastrointestinal: Negative for abdominal pain, constipation, diarrhea, nausea and vomiting.  Genitourinary: Negative for dysuria.  Musculoskeletal: Negative for back pain.  Neurological: Negative for light-headedness, numbness and headaches.  Psychiatric/Behavioral: Negative for agitation.  All other systems reviewed and are negative.    Physical Exam Updated Vital Signs BP (!) 146/64 (BP Location: Right Arm)   Pulse 77   Temp 98.3 F (36.8 C) (Oral)   Resp 18   SpO2 99%   Physical Exam  Constitutional: He appears well-developed and well-nourished. No distress.  HENT:  Head: Normocephalic.  Mouth/Throat: Oropharynx is clear and moist. No oropharyngeal exudate.  Eyes: Conjunctivae and EOM are normal. Pupils are equal, round, and reactive to light.  Neck: Normal range of motion.  Cardiovascular: Normal rate and intact distal pulses.  Murmur heard. Pulmonary/Chest: Effort normal. No stridor. No respiratory distress. He has no wheezes. He exhibits tenderness.    Abdominal: Soft. There is no tenderness.  Musculoskeletal: He exhibits edema. He exhibits no tenderness.  Neurological: No sensory deficit. He exhibits normal muscle tone.  Skin: Capillary refill takes less than 2 seconds. He is not diaphoretic. No erythema. No pallor.  Psychiatric: He has a normal mood and affect.  Nursing note and vitals reviewed.    ED Treatments / Results  Labs (all labs ordered are listed, but only abnormal results are displayed) Labs Reviewed  BASIC METABOLIC PANEL - Abnormal; Notable for the following components:      Result Value   Glucose, Bld 157 (*)    Calcium 8.6 (*)    GFR calc non Af Amer 57 (*)    All other  components within normal limits  CBC - Abnormal; Notable for the following components:   Hemoglobin 12.8 (*)    HCT 38.5 (*)    All other components within normal limits  HEPATIC FUNCTION PANEL - Abnormal; Notable for the following components:   Total Protein 6.4 (*)    Albumin 3.3 (*)    All other components within normal limits  LIPASE, BLOOD  BRAIN NATRIURETIC PEPTIDE  I-STAT TROPONIN, ED    EKG  EKG Interpretation  Date/Time:  Friday October 13 2017 10:08:46 EST Ventricular Rate:  76 PR Interval:    QRS Duration: 101 QT Interval:  404 QTC Calculation: 455 R Axis:   76 Text Interpretation:  Sinus rhythm Prolonged PR interval Anterior infarct, old When compared to prior, no significant changes seen.  No STEMI Confirmed by Antony Blackbird [phone]) on 10/13/2017 10:23:44 AM       Radiology Dg Chest 2 View  Result Date: 10/13/2017 CLINICAL DATA:  Chest pain.  Recent fall. EXAM: CHEST  2 VIEW COMPARISON:  October 02, 2017 FINDINGS: There is no edema or consolidation. Heart is mildly enlarged with pulmonary vascularity within normal limits. Patient is status post coronary artery bypass grafting. No adenopathy. No pneumothorax. There is degenerative change in each shoulder and in the thoracic spine. IMPRESSION: No edema or consolidation. Stable cardiac prominence. No evident pneumothorax. Electronically Signed   By: Lowella Grip III M.D.   On: 10/13/2017 11:34   Dg Hip Unilat W Or Wo Pelvis 2-3 Views Left  Result Date: 10/13/2017 CLINICAL DATA:  Fall.  Pain. EXAM: DG HIP (WITH OR WITHOUT PELVIS) 2-3V LEFT COMPARISON:  05/30/2017 . FINDINGS: Degenerative changes lumbar spine and both hips. No acute bony or joint abnormality identified. Stable mild deformities noted of the lesser trochanters bilaterally. No evidence of acute fracture or dislocation. Postsurgical changes lumbar  spine . Pelvic calcifications consistent phleboliths. Surgical staples noted over the left thigh.  IMPRESSION: Degenerative changes lumbar spine and both hips.Postsurgical changes lumbar spine. No acute bony or joint abnormality identified. No evidence of fracture or dislocation. Electronically Signed   By: Marcello Moores  Register   On: 10/13/2017 11:39    Procedures Procedures (including critical care time)  Medications Ordered in ED Medications  fentaNYL (SUBLIMAZE) injection 50 mcg (50 mcg Intravenous Given 10/13/17 1133)  HYDROcodone-acetaminophen (NORCO/VICODIN) 5-325 MG per tablet 1 tablet (1 tablet Oral Given 10/13/17 1525)     Initial Impression / Assessment and Plan / ED Course  I have reviewed the triage vital signs and the nursing notes.  Pertinent labs & imaging results that were available during my care of the patient were reviewed by me and considered in my medical decision making (see chart for details).     Edward [name] is a 76 y.o. male with a past medical history significant for CAD status post PCI and CABG, hypertension, hyperlipidemia, GERD, CKD, and CHF who presents with left-sided chest pain.  Patient reports that just over 1 week ago he had a fall onto concrete.  He injured his bilateral legs, his left chest, and his left hand.  He reports that during that visit his x-ray was reassuring and he was sent home.  He reports that since that fall he has had continued left-sided chest pain.  He describes his pain as a 7 out of 10 and in the left central chest radiating around the left side.  It does not radiate to his back or his neck.  It does not go down his arm.  He denies nausea, vomiting, diaphoresis.  He denies shortness of breath or pleuritic pain.  He says that it has been constant.  He denies fevers, chills, constipation, diarrhea, or any dysuria.  He does report that his urine is slightly darker than before.  He reports that he does take Plavix.  He did not hit his head or lose consciousness during the fall.  He denies any other complaints on arrival.  On exam, patient  has tenderness in the left central and left lateral chest.  There is no laceration seen.  No crepitance was appreciated.  Abdomen was nontender.  Left hip was slightly tender.  Patient had symmetric pulses in upper and lower extremity's.  There was pitting edema in both legs which patient reports that is unchanged from prior.  Patient takes Lasix.  Patient will have repeat imaging of his chest to look for traumatic injuries.  Patient will also have laboratory testing given his significant cardiac history.  Patient with x-ray of the left hip and will be reevaluated after imaging.  Diagnostic workup was reassuring.  Troponin negative.  As patient has had pain for several days, single troponin was felt sufficient. BMP reassuring.  CBC showed mild anemia but no leukocytosis. Hepatic function reassuring. BNP nonelevated.   CXR shows no injury. No PNA.  Imaging negative for acute fractures.  Given reassuring workup, I suspect the patient's symptoms are musculoskeletal in nature.  Agrees and feels he pulled a muscle.  Thus, patient will be given prescription for pain medication.  Patient was given 1 dose and reported improvement in his pain.  Apartment without difficulty and without significant pain.  No pleurisy or exertional chest pain.  Improved.  Patient understood strict return precautions as well as PCP follow-up instructions.  Patient had no other questions or concerns and was discharged in good  condition with improving symptoms.   Final Clinical Impressions(s) / ED Diagnoses   Final diagnoses:  Precordial pain    ED Discharge Orders        Ordered    HYDROcodone-acetaminophen (NORCO/VICODIN) 5-325 MG tablet  Every 4 hours PRN     10/13/17 1537      Clinical Impression: 1. Precordial pain     Disposition: Discharge  Condition: Good  I have discussed the results, Dx and Tx plan with the pt(& family if present). He/she/they expressed understanding and agree(s) with the plan. Discharge  instructions discussed at great length. Strict return precautions discussed and pt &/or family have verbalized understanding of the instructions. No further questions at time of discharge.    This SmartLink is deprecated. Use AVSMEDLIST instead to display the medication list for a patient.  Follow Up: Ann Held, DO [address] [place] [address] [phone]     [place] [address] [id number] [place] [phone]  If symptoms worsen     Itzamar Traynor, Gwenyth Allegra, MD 10/13/17 2107

## 2017-10-13 NOTE — ED Notes (Signed)
Patient transported to X-ray 

## 2017-10-13 NOTE — ED Notes (Signed)
Patient ambulated to restroom.  Gait steady and even.   

## 2017-10-13 NOTE — ED Notes (Signed)
Pt still in x-ray

## 2017-10-13 NOTE — ED Notes (Signed)
Patient able to ambulate independently  

## 2017-10-13 NOTE — ED Notes (Signed)
ED Provider at bedside. 

## 2017-10-13 NOTE — ED Triage Notes (Signed)
[facility]- with persistent chx pain even after taking 2 nitro. Central sub sternal pain 7/10 and sharp in natured. No other symptoms. This began after a fall last Sunday.   Pt received 324 asa and 1 nitro with [facility]. A/o NAD. 12 lead WDL.  Vitals  152/94 78 P 16 Resp 96% RA

## 2017-10-13 NOTE — ED Notes (Signed)
EDP at bedside  

## 2017-10-23 ENCOUNTER — Telehealth: Payer: Self-pay

## 2017-10-23 NOTE — Telephone Encounter (Signed)
Received fax for a refill on Humulin R 500 Units/ML Vial. Inject 60 units into the skin 3x daily. This is not on the patients medication list, Humulin R 100units/ml is on the list but it states to administer 20 units 3x daily. Please advise if this is something that needs to be added

## 2017-10-25 NOTE — Telephone Encounter (Signed)
LMTCB

## 2017-10-25 NOTE — Telephone Encounter (Signed)
C, he is not on U500, but can you please clarify with him exactly which regimen is he using now as he changes this all the time by himself.

## 2017-10-26 MED ORDER — INSULIN REGULAR HUMAN 100 UNIT/ML IJ SOLN: INTRAMUSCULAR | 2 refills | Status: DC

## 2017-10-26 NOTE — Telephone Encounter (Signed)
LMTCB

## 2017-10-26 NOTE — Telephone Encounter (Signed)
Pt stated he is not taking the 500 units/ml he is unsure how they got that. Sent a refill in for him with the correct dosing.

## 2017-11-06 ENCOUNTER — Encounter: Payer: Self-pay | Admitting: Internal Medicine

## 2017-11-06 ENCOUNTER — Ambulatory Visit (INDEPENDENT_AMBULATORY_CARE_PROVIDER_SITE_OTHER): Payer: Medicare Other | Admitting: Internal Medicine

## 2017-11-06 VITALS — BP 154/80 | HR 80 | Ht 68.0 in | Wt 278.4 lb

## 2017-11-06 DIAGNOSIS — Z794 Long term (current) use of insulin: Secondary | ICD-10-CM | POA: Diagnosis not present

## 2017-11-06 DIAGNOSIS — E1159 Type 2 diabetes mellitus with other circulatory complications: Secondary | ICD-10-CM

## 2017-11-06 LAB — POCT GLYCOSYLATED HEMOGLOBIN (HGB A1C): HEMOGLOBIN A1C: 9.8

## 2017-11-06 MED ORDER — INSULIN NPH (HUMAN) (ISOPHANE) 100 UNIT/ML ~~LOC~~ SUSP: SUBCUTANEOUS | 5 refills | Status: DC

## 2017-11-06 MED ORDER — INSULIN REGULAR HUMAN 100 UNIT/ML IJ SOLN: INTRAMUSCULAR | 5 refills | Status: DC

## 2017-11-06 NOTE — Addendum Note (Signed)
Addended by: Drucilla Schmidt on: 11/06/2017 11:54 AM   Modules accepted: Orders

## 2017-11-06 NOTE — Patient Instructions (Addendum)
  Please stop the 70/30 insulin and start the following: Insulin Before breakfast Before lunch Before dinner  Regular (Novolin) 40 40 40  NPH 30  30  Please inject the insulin 30 min before meals.  Please return in 1.5 months with your sugar log.

## 2017-11-06 NOTE — Progress Notes (Signed)
Patient ID: Edward [name], male   DOB: [birth year], 77 y.o.   MRN: [id number]  HPI: Edward [name] is a 77 y.o.-year-old male, returning for f/u for DM2,  dx 1987, insulin-dependent since 1989, uncontrolled, with complications (CAD - s/p 2 AMIs, s/p CABG x2 10/2016, CHF, PN). Last visit 3 mo ago.  As the insulin was expensive (150$ per vial) >> he had to be w/o insulin x 1 week in December.  Latest HbA1c levels: HbA1c calculated from the fructosamine is 6.77% - likely not correct based on his CBGs in his log... Lab Results  Component Value Date   HGBA1C 9.8 (H) 06/06/2017   HGBA1C 7.7 12/16/2016   HGBA1C 9.4 09/13/2016  09/13/2016: HbA1c calculated from fructosamine is 6.86%   He was on: - Levemir 70 units at night (He did not switch to 45 units twice a day as suggested at last visit) - Novolog 15 min before meals: - 30 units before a smaller meal - 40 units before a larger meal He was on Metformin >> N/V/D. U500 insulin was not covered for him despite multiple requests and PAs to the insurance  Now on:  - Could not take the 70/30 insulin as recommended since he could not afford it.  He also had to switch from NovoLog to Novolin> Insulin  Before breakfast  Before lunch  Before dinner  Bedtime  Novolin 70/30 (vial) 60  Novolin/ 40 40 40    Pt checks his sugars 1-2x a day: - am:   115-180 >> w/o exercise: 280-400 (!) >> 130-150 >> 240-453 - 2h after b'fast:  52 (took Levemir 2x a day) >> 150 x1 >> 334-518 - before lunch:  210-250 >> 180-200s >> 180-210 >> 102 -162 - 2h after lunch:  121-254 >> 170-180s >> n/c >> 277 - before dinner: 170s >> 142-180 >> 200-300 >> 120-130 >> n/c - 2h after dinner: 99, 128, 368 >> n/c >> 280 >> n/c - bedtime: 160s >> n/c >> 140-165 >> 170-175 - nighttime: occas. 87 >> 80s >> 115 >> n/c Lowest sugar was 75 (after increased exercise) >> 83 >> 78; he has hypoglycemia awareness at 100. Highest sugar was 400s >> 200s >> 518.  Has a One Touch Ultra  meter.   - + CKD, last BUN/creatinine:  Lab Results  Component Value Date   BUN 10 10/13/2017   CREATININE 1.21 10/13/2017  Pt is seen by nephrology - proteinuria (555 mg/24h). On Avapro - Dr. Hollie Salk. -+ HL. Last set of lipids: Lab Results  Component Value Date   CHOL 191 07/24/2017   HDL 40.30 07/24/2017   LDLCALC 120 (H) 07/24/2017   LDLDIRECT 144.0 07/08/2015   TRIG 157.0 (H) 07/24/2017   CHOLHDL 5 07/24/2017  On pravastatin. - last eye exam was in 04/2017 (Dr Zigmund Daniel).  + DR.  Had cataracts sx. will have eyelid surgery soon. - He does have numbness and tingling in his feet. Sees podiatry. On ASA 81.  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 He was then in cardiac rehabilitation >> then admitted again 05/2017 for CHF exacerbation.  ROS: Constitutional: no weight gain/no weight loss, no fatigue, no subjective hyperthermia, no subjective hypothermia Eyes: no blurry vision, no xerophthalmia ENT: no sore throat, no nodules palpated in throat, no dysphagia, no odynophagia, no hoarseness Cardiovascular: no CP/no SOB/no palpitations/no leg swelling Respiratory: no cough/no SOB/no wheezing Gastrointestinal: no N/no V/no D/no C/no acid reflux Musculoskeletal: no muscle aches/no joint aches Skin: no rashes, no hair loss  Neurological: no tremors/+ numbness/+ tingling/no dizziness  I reviewed pt's medications, allergies, PMH, social hx, family hx, and changes were documented in the history of present illness. Otherwise, unchanged from my initial visit note.  PE: BP (!) 154/80   Pulse 80   Ht 5\' 8"  (1.727 m)   Wt 278 lb 6.4 oz (126.3 kg)   SpO2 97%   BMI 42.33 kg/m  Body mass index is 42.33 kg/m. Wt Readings from Last 3 Encounters:  11/06/17 278 lb 6.4 oz (126.3 kg)  08/04/17 275 lb (124.7 kg)  07/31/17 277 lb (125.6 kg)   Constitutional: overweight, in NAD Eyes: PERRLA, EOMI, no exophthalmos ENT: moist mucous membranes, no thyromegaly, no cervical lymphadenopathy Cardiovascular: RRR,  No MRG, + lower extremity edema Respiratory: CTA B Gastrointestinal: abdomen soft, NT, ND, BS+ Musculoskeletal: no deformities, strength intact in all 4 Skin: moist, warm, no rashes Neurological: no tremor with outstretched hands, DTR normal in all 4  ASSESSMENT: 1. DM2, insulin-dependent, uncontrolled, with complications - CAD H/o steroid inj's  2. Obesity class 3  Cardiologist: Dr Tamala Julian. Pulmonologist: Dr Lake Bells  PLAN:  1. Patient with type 2 diabetes, previously doing well on U500 insulin, but this is extremely expensive for him >>  We then tried basal-bolus insulin regimen but this was not successful >> currently on premixed insulin per his request.  He is constantly adjusting his regimen so he is rarely taking the insulin doses recommended. - At this visit, he tells me that he again has problems with his insurance paying for his insulin.  He had to reduce his 70/30 insulin to only 1 dose at bedtime since he could not afford it.  Also, he could not get NovoLog and recently started Novolin. - At this visit, we discussed about-switching to rely on insulin from [facility] NPH and regular and to mix these himself - I explained how the new regimen will work how he needs to mix the insulins and what to expect after he starts this - we will started a lower dose of NPH but most likely will need to increase it at next visit - Reviewed his latest HbA1c from 5 months ago >> higher, 9.8% - I suggested to:  Patient Instructions    Please stop the 70/30 insulin and start the following: Insulin Before breakfast Before lunch Before dinner  Regular (Novolin) 40 40 40  NPH 30  30  Please inject the insulin 30 min before meals.  Please return in 1.5 months with your sugar log.   - today, HbA1c is 9.8% (stable, very high) - continue checking sugars at different times of the day - check 3x a day, rotating checks - advised for yearly eye exams >> he is UTD - Return to clinic in 3 mo with sugar  log    2. Obesity class 3 - gained a little weight over the Holidays  - time spent with the patient: 25 min, of which >50% was spent in reviewing his meter downloads, discussing his hyper-glycemic episodes, reviewing  previous labs and insulin doses and developing a plan to avoid hypo- and hyper-glycemia.   Philemon Kingdom, MD PhD [facility]

## 2017-11-07 ENCOUNTER — Encounter: Payer: Self-pay | Admitting: Nurse Practitioner

## 2017-11-07 ENCOUNTER — Ambulatory Visit (INDEPENDENT_AMBULATORY_CARE_PROVIDER_SITE_OTHER): Payer: Medicare Other | Admitting: Nurse Practitioner

## 2017-11-07 ENCOUNTER — Telehealth: Payer: Self-pay | Admitting: Internal Medicine

## 2017-11-07 VITALS — BP 152/64 | HR 74 | Ht 68.0 in | Wt 277.4 lb

## 2017-11-07 DIAGNOSIS — E785 Hyperlipidemia, unspecified: Secondary | ICD-10-CM

## 2017-11-07 DIAGNOSIS — I25810 Atherosclerosis of coronary artery bypass graft(s) without angina pectoris: Secondary | ICD-10-CM | POA: Diagnosis not present

## 2017-11-07 DIAGNOSIS — I10 Essential (primary) hypertension: Secondary | ICD-10-CM | POA: Diagnosis not present

## 2017-11-07 DIAGNOSIS — I5032 Chronic diastolic (congestive) heart failure: Secondary | ICD-10-CM

## 2017-11-07 MED ORDER — FUROSEMIDE 80 MG PO TABS: 80.0000 mg | ORAL_TABLET | Freq: Every day | ORAL | 3 refills | Status: DC

## 2017-11-07 MED ORDER — AMLODIPINE BESYLATE 5 MG PO TABS
5.0000 mg | ORAL_TABLET | Freq: Every day | ORAL | 3 refills | Status: DC
Start: 2017-11-07 — End: 2018-03-08

## 2017-11-07 MED ORDER — NITROGLYCERIN 0.4 MG SL SUBL: 0.4000 mg | SUBLINGUAL_TABLET | SUBLINGUAL | 3 refills | Status: DC | PRN

## 2017-11-07 MED ORDER — CLOPIDOGREL BISULFATE 75 MG PO TABS: 75.0000 mg | ORAL_TABLET | Freq: Every day | ORAL | 3 refills | Status: DC

## 2017-11-07 NOTE — Addendum Note (Signed)
Addended by: Burtis Junes on: 11/07/2017 09:41 AM   Modules accepted: Orders

## 2017-11-07 NOTE — Patient Instructions (Addendum)
We will be checking the following labs today - NONE   Medication Instructions:    Continue with your current medicines.   I sent in your refills today    Testing/Procedures To Be Arranged:  N/A  Follow-Up:   See Dr. Tamala Julian in about 4 months - see me 4 months later    Other Special Instructions:   N/A    If you need a refill on your cardiac medications before your next appointment, please call your pharmacy.   Call the [place] office at [phone] if you have any questions, problems or concerns.

## 2017-11-07 NOTE — Telephone Encounter (Signed)
Spoke with pharmacy and this is OTC and he does not need a prescription. Called pt and LVM for him to call us back so that I can explain this to him

## 2017-11-07 NOTE — Telephone Encounter (Signed)
Please advise on below  

## 2017-11-07 NOTE — Progress Notes (Signed)
CARDIOLOGY OFFICE NOTE  Date:  11/07/2017    Edward [name] Date of Birth: [birth year] Medical Record #[id number]  PCP:  Ann Held, DO  Cardiologist:  Jennings Books    Chief Complaint  Patient presents with  . Congestive Heart Failure  . Coronary Artery Disease    3 month check - seen for Dr. Tamala Julian    History of Present Illness: Edward [name] is a 77 y.o. male who presents today for a 3 month check. Seen for Dr. Tamala Julian.   He has a known history of CAD with April 2016 drug-eluting stent and admission in February 2017 with unstable angina due to high-grade circumflex being treated with medical therapy. 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 Roxan Hockey. Other issues include diastolic HF.   Seen here back in August of 2018 after having heart failure. Lasix was increased.   Hospitalized in September with had atypical chest pain. This was in the setting of chills and cough. Seen by cardiology and discussed possible need for outpatient stress testing. Imdur was to be added. I then saw him back in October - he never started the Imdur. Stress test was not felt to be needed by either me or Dr. Tamala Julian - he was greatly improved on follow up and he was felt to best be managed medically.   Comes in today. Here alone. He feels like he is doing ok. He had a fall over a month ago - slipped due to the rain - went to the ER - then back to the ER a few weeks later - no significant injury fortunately. He feels better - he was pretty sore. Currently he says he is "back on track". Feels good - no chest pain. Breathing is ok. Some swelling - admits he probably gets too much salt. BP about 952 systolic at home. He feels like he is doing ok.   Labs and recent Xrays reviewed from December.   Past Medical History:  Diagnosis Date  . Adenomatous colon polyp 04/2011  . CAD (coronary artery disease)    a. 01/2015 DES to LAD  b. 12/2015: [place] 85% oLCx  lesion--> rx therapy.  . Chronic diastolic CHF (congestive heart failure) ([place])    a. 05/2017 Echo: EF 60-65%, Gr1 DD, no rwma, mildly dil LA, nl RV fxn, mild TR.  . CKD (chronic kidney disease), stage II    a. probable CKD II-III with baseline CR 1.1-1.3.  . DM type 2 (diabetes mellitus, type 2), insulin dependent 01/29/2014   fasting cbg 50-120 with new regimen  . Dyslipidemia, goal LDL below 70 01/29/2014  . Enlarged prostate   . Essential tremor    a. on proprnolol  . GERD (gastroesophageal reflux disease)   . Hypertension   . Internal hemorrhoid   . Sleep apnea    does not use cpap (06/04/2015)  . TIA (transient ischemic attack) 2002    Past Surgical History:  Procedure Laterality Date  . BACK SURGERY    . CARDIAC CATHETERIZATION  01/28/14   + CAD treat medically  . CARDIAC CATHETERIZATION N/A 12/21/2015   Procedure: Right/Left Heart Cath and Coronary Angiography;  Surgeon: Belva Crome, MD;  Location: [place] CV [facility];  Service: Cardiovascular;  Laterality: N/A;  . CARDIAC CATHETERIZATION N/A 11/02/2016   Procedure: Left Heart Cath and Coronary Angiography;  Surgeon: Nelva Bush, MD;  Location: [place] [facility];  Service: Cardiovascular;  Laterality: N/A;  . CATARACT EXTRACTION Bilateral   . CATARACT EXTRACTION, BILATERAL Bilateral   . CORONARY ANGIOPLASTY WITH STENT PLACEMENT  01/30/2015   DES [place]  [place] to LAD by Dr Tamala Julian  . CORONARY ARTERY BYPASS GRAFT N/A 11/09/2016   Procedure: CORONARY ARTERY BYPASS GRAFTING (CABG) x 2;  Surgeon: Melrose Nakayama, MD;  Location: [place];  Service: Open Heart Surgery;  Laterality: N/A;  . ENDOVEIN HARVEST OF GREATER SAPHENOUS VEIN Left 11/09/2016   Procedure: ENDOVEIN HARVEST OF GREATER SAPHENOUS VEIN;  Surgeon: Melrose Nakayama, MD;  Location: [place];  Service: Open Heart Surgery;  Laterality: Left;  . JOINT REPLACEMENT    . LEFT HEART CATHETERIZATION WITH CORONARY ANGIOGRAM N/A 01/28/2014   Procedure: LEFT HEART CATHETERIZATION  WITH CORONARY ANGIOGRAM;  Surgeon: Sinclair Grooms, MD;  Location: [facility] CATH LAB;  Service: Cardiovascular;  Laterality: N/A;  . LEFT HEART CATHETERIZATION WITH CORONARY ANGIOGRAM N/A 01/30/2015   Procedure: LEFT HEART CATHETERIZATION WITH CORONARY ANGIOGRAM;  Surgeon: Belva Crome, MD;  Location: [facility] CATH LAB;  Service: Cardiovascular;  Laterality: N/A;  . LUMBAR [place]    . SHOULDER ARTHROSCOPY Right 07/30/2014   Procedure: Right Shoulder Arthroscopy, Debridement, Decompression, Manipulation Under Anesthesia;  Surgeon: Newt Minion, MD;  Location: [place];  Service: Orthopedics;  Laterality: Right;  . TEE WITHOUT CARDIOVERSION N/A 11/09/2016   Procedure: TRANSESOPHAGEAL ECHOCARDIOGRAM (TEE);  Surgeon: Melrose Nakayama, MD;  Location: [place];  Service: Open Heart Surgery;  Laterality: N/A;  . TOTAL KNEE ARTHROPLASTY Bilateral      Medications: Current Meds  Medication Sig  . acetaminophen (TYLENOL) 500 MG tablet Take 2 tablets (1,000 mg total) by mouth every 6 (six) hours as needed.  Marland Kitchen albuterol (VENTOLIN HFA) 108 (90 Base) MCG/ACT inhaler inhale 2 puffs by mouth every 6 hours if needed for wheezing or shortness of breath  . amLODipine (NORVASC) 5 MG tablet Take 1 tablet (5 mg total) by mouth daily.  Marland Kitchen aspirin EC 81 MG tablet Take 81 mg by mouth daily.  . clonazePAM (KLONOPIN) 0.5 MG tablet Take 1 tablet (0.5 mg total) by mouth 2 (two) times daily as needed (tremors).  . clopidogrel (PLAVIX) 75 MG tablet Take 1 tablet (75 mg total) by mouth daily.  . diclofenac sodium (VOLTAREN) 1 % GEL Apply 2 g topically 4 (four) times daily as needed (for pain).   . fluticasone furoate-vilanterol (BREO ELLIPTA) 100-25 MCG/INH AEPB Inhale 1 puff into the lungs daily.  . fluticasone furoate-vilanterol (BREO ELLIPTA) 100-25 MCG/INH AEPB Inhale 1 puff into the lungs daily.  . furosemide (LASIX) 80 MG tablet Take 1 tablet (80 mg total) by mouth daily.  Marland Kitchen gabapentin (NEURONTIN) 300 MG capsule take 1  capsule by mouth at bedtime for 1 week then INCREASE TO 1 CAP TWICE DAILY  . HYDROcodone-acetaminophen (NORCO/VICODIN) 5-325 MG tablet Take 1 tablet by mouth every 4 (four) hours as needed.  . insulin NPH Human (HUMULIN N) 100 UNIT/ML injection Inject under skin 30 units 2x a day - [employer]  . isosorbide mononitrate (IMDUR) 30 MG 24 hr tablet Take 15 mg by mouth daily.  . meclizine (ANTIVERT) 25 MG tablet Take 1 tablet (25 mg total) by mouth 2 (two) times daily as needed for dizziness.  . nitroGLYCERIN (NITROSTAT) 0.4 MG SL tablet Place 1 tablet (0.4 mg total) under the tongue every 5 (five) minutes as needed for chest pain. Max: 3 doses per episode.  . Polyvinyl Alcohol-Povidone (REFRESH OP) Place 1  drop into both eyes 3 (three) times daily as needed (for dry eyes). Reported on 12/25/2015  . potassium chloride SA (K-DUR,KLOR-CON) 20 MEQ tablet Take 1.5 tablets (30 mEq total) by mouth daily.  . pravastatin (PRAVACHOL) 20 MG tablet Take 1 tablet (20 mg total) by mouth daily.  . propranolol (INDERAL) 40 MG tablet take 1 tablet by mouth three times a day  . psyllium (METAMUCIL) 58.6 % packet Take 1 packet by mouth daily as needed (constipation).  . traMADol (ULTRAM) 50 MG tablet Take 1 tablet (50 mg total) by mouth every 6 (six) hours as needed.  . [DISCONTINUED] amLODipine (NORVASC) 5 MG tablet Take 1 tablet (5 mg total) by mouth daily.  . [DISCONTINUED] clopidogrel (PLAVIX) 75 MG tablet Take 1 tablet (75 mg total) by mouth daily.  . [DISCONTINUED] furosemide (LASIX) 80 MG tablet Take 80 mg by mouth daily.  . [DISCONTINUED] insulin regular (HUMULIN R) 100 units/mL injection Inject 40 units 3 times daily with meals - [employer] brand     Allergies: Allergies  Allergen Reactions  . Pneumococcal Vaccines Anaphylaxis    Social History: The patient  reports that  has never smoked. he has never used smokeless tobacco. He reports that he does not drink alcohol or use drugs.   Family History: The  patient's family history includes Alzheimer's disease in his sister; Asthma in his brother; CAD in his brother and sister; Diabetes in his other; Prostate cancer in his brother.   Review of Systems: Please see the history of present illness.   Otherwise, the review of systems is positive for none.   All other systems are reviewed and negative.   Physical Exam: VS:  BP (!) 152/64 (BP Location: Left Arm, Patient Position: Sitting, Cuff Size: Large)   Pulse 74   Ht 5\' 8"  (1.727 m)   Wt 277 lb 6.4 oz (125.8 kg)   SpO2 97% Comment: at rest  BMI 42.18 kg/m  .  BMI Body mass index is 42.18 kg/m.  Wt Readings from Last 3 Encounters:  11/07/17 277 lb 6.4 oz (125.8 kg)  11/06/17 278 lb 6.4 oz (126.3 kg)  08/04/17 275 lb (124.7 kg)   Repeat BP is 130/60 by me.   General: So pleasant. Elderly obese male. Alert and in no acute distress.   HEENT: Normal.  Neck: Supple, no JVD, carotid bruits, or masses noted.  Cardiac: Regular rate and rhythm. Heart tones are distant due to body habitus. No edema.  Respiratory:  Lungs are clear to auscultation bilaterally with normal work of breathing.  GI: Soft and nontender. Obese.  MS: No deformity or atrophy. Gait and ROM intact.  Skin: Warm and dry. Color is normal.  Neuro:  Strength and sensation are intact and no gross focal deficits noted.  Psych: Alert, appropriate and with normal affect.   LABORATORY DATA:  EKG:  EKG is not ordered today.  Lab Results  Component Value Date   WBC 7.9 10/13/2017   HGB 12.8 (L) 10/13/2017   HCT 38.5 (L) 10/13/2017   PLT 216 10/13/2017   GLUCOSE 157 (H) 10/13/2017   CHOL 191 07/24/2017   TRIG 157.0 (H) 07/24/2017   HDL 40.30 07/24/2017   LDLDIRECT 144.0 07/08/2015   LDLCALC 120 (H) 07/24/2017   ALT 17 10/13/2017   AST 21 10/13/2017   NA 139 10/13/2017   K 3.8 10/13/2017   CL 103 10/13/2017   CREATININE 1.21 10/13/2017   BUN 10 10/13/2017   CO2 28 10/13/2017  TSH 1.362 06/06/2017   PSA 0.74  02/11/2014   INR 0.96 06/06/2017   HGBA1C 9.8 11/06/2017   MICROALBUR 62.1 (H) 01/02/2017     BNP (last 3 results) Recent Labs    06/06/17 1839 07/15/17 1109 10/13/17 1027  BNP 53.5 26.8 27.3    ProBNP (last 3 results) No results for input(s): PROBNP in the last 8760 hours.   Other Studies Reviewed Today:  2-D Doppler echocardiogram 06/08/17: Study Conclusions  - Left ventricle: The cavity size was normal. There was severe concentric hypertrophy. Systolic function was normal. The estimated ejection fraction was in the range of 60% to 65%. Wall motion was normal; there were no regional wall motion abnormalities. Doppler parameters are consistent with abnormal left ventricular relaxation (grade 1 diastolic dysfunction). There was no evidence of elevated ventricular filling pressure by Doppler parameters. - Aortic valve: Trileaflet; normal thickness leaflets. There was no regurgitation. - Aortic root: The aortic root was normal in size. - Mitral valve: There was no regurgitation. - Left atrium: The atrium was mildly dilated. - Right ventricle: Systolic function was normal. - Tricuspid valve: There was mild regurgitation. - Pulmonary arteries: Systolic pressure was within the normal range. - Inferior vena cava: The vessel was not visualized. - Pericardium, extracardiac: There was no pericardial effusion.  Assessment/Plan:  1. Recent fall - no significant injury - he has improved clinically.   2. Chronic diastolic HF - looks compensated to me. No changes made. Needs to really restrict his salt. BP better by me.   3. CAD with prior PCI - subsequent CABG - no active chest pain. Would favor continued medical management.   4. DM - just started on insulin - he reports sugars are coming down.   5. Tremor  6. CKD - most recent lab noted.   7. HTN - recheck by me is just fine - no changes made today.  8. HLD - on low dose Pravachol.     Current medicines are reviewed with the patient today.  The patient does not have concerns regarding medicines other than what has been noted above.  The following changes have been made:  See above.  Labs/ tests ordered today include:   No orders of the defined types were placed in this encounter.    Disposition:   FU with Dr. Tamala Julian in about 4 months. I will see back in August of 2019.   Patient is agreeable to this plan and will call if any problems develop in the interim.   SignedTruitt Merle, NP  11/07/2017 9:27 AM  [facility] [address] [place], [place]  [address] Phone: [phone] Fax: [phone]

## 2017-11-07 NOTE — Telephone Encounter (Signed)
Pt stated that prescription was sent over to pharmacy  Because of insurance pt stated they needed  Novolin R and Novolin N   Please advise

## 2017-11-07 NOTE — Telephone Encounter (Signed)
Of course. OK to send those 2.

## 2017-11-08 NOTE — Telephone Encounter (Signed)
Went over with patient twice, verbalized his understanding.

## 2017-11-20 ENCOUNTER — Encounter (INDEPENDENT_AMBULATORY_CARE_PROVIDER_SITE_OTHER): Payer: Medicare Other | Admitting: Ophthalmology

## 2017-11-27 DIAGNOSIS — Z961 Presence of intraocular lens: Secondary | ICD-10-CM | POA: Diagnosis not present

## 2017-11-27 DIAGNOSIS — H02423 Myogenic ptosis of bilateral eyelids: Secondary | ICD-10-CM | POA: Diagnosis not present

## 2017-11-27 DIAGNOSIS — E113591 Type 2 diabetes mellitus with proliferative diabetic retinopathy without macular edema, right eye: Secondary | ICD-10-CM | POA: Diagnosis not present

## 2017-11-27 DIAGNOSIS — H40013 Open angle with borderline findings, low risk, bilateral: Secondary | ICD-10-CM | POA: Diagnosis not present

## 2017-11-27 DIAGNOSIS — E113492 Type 2 diabetes mellitus with severe nonproliferative diabetic retinopathy without macular edema, left eye: Secondary | ICD-10-CM | POA: Diagnosis not present

## 2017-12-04 ENCOUNTER — Encounter (INDEPENDENT_AMBULATORY_CARE_PROVIDER_SITE_OTHER): Payer: Medicare Other | Admitting: Ophthalmology

## 2017-12-04 DIAGNOSIS — I10 Essential (primary) hypertension: Secondary | ICD-10-CM

## 2017-12-04 DIAGNOSIS — E113591 Type 2 diabetes mellitus with proliferative diabetic retinopathy without macular edema, right eye: Secondary | ICD-10-CM | POA: Diagnosis not present

## 2017-12-04 DIAGNOSIS — E113392 Type 2 diabetes mellitus with moderate nonproliferative diabetic retinopathy without macular edema, left eye: Secondary | ICD-10-CM | POA: Diagnosis not present

## 2017-12-04 DIAGNOSIS — H43813 Vitreous degeneration, bilateral: Secondary | ICD-10-CM | POA: Diagnosis not present

## 2017-12-04 DIAGNOSIS — E11319 Type 2 diabetes mellitus with unspecified diabetic retinopathy without macular edema: Secondary | ICD-10-CM | POA: Diagnosis not present

## 2017-12-04 DIAGNOSIS — H35033 Hypertensive retinopathy, bilateral: Secondary | ICD-10-CM

## 2017-12-04 LAB — HM DIABETES EYE EXAM

## 2017-12-21 ENCOUNTER — Encounter: Payer: Self-pay | Admitting: Family Medicine

## 2017-12-26 ENCOUNTER — Ambulatory Visit: Payer: Medicare Other | Admitting: Internal Medicine

## 2017-12-26 DIAGNOSIS — Z0289 Encounter for other administrative examinations: Secondary | ICD-10-CM

## 2017-12-26 NOTE — Progress Notes (Deleted)
Patient ID: Edward [name], male   DOB: 03-Dec-1940, 77 y.o.   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 Last visit 1.5 mo ago.  Latest HbA1c levels: Lab Results  Component Value Date   HGBA1C 9.8 11/06/2017   HGBA1C 9.8 (H) 06/06/2017   HGBA1C 7.7 12/16/2016  05/16/2017: HbA1c calculated from the fructosamine is 6.77% - likely not correct based on his CBGs in his log... 09/13/2016: HbA1c calculated from fructosamine is 6.86%   He was on 70/30 insulin at last visit.  Now on: Insulin Before breakfast Before lunch Before dinner  Regular 40 40 40  NPH 30  30  Please inject the insulin 30 min before meals. He was on Metformin >> N/V/D. U500 insulin was not covered for him despite multiple requests and PAs to the insurance  Pt checks his sugars 1-2x a day: - am:  280-400 (!) >> 130-150 >> 240-453 - 2h after b'fast:   150 x1 >> 334-518 - before lunch:   180-210 >> 102 -162 - 2h after lunch:   170-180s >> n/c >> 277 - before dinner: 200-300 >> 120-130 >> n/c - 2h after dinner: 99, 128, 368 >> n/c >> 280 >> n/c - bedtime: 160s >> n/c >> 140-165 >> 170-175 - nighttime: occas. 87 >> 80s >> 115 >> n/c Lowest sugar was 78 >> ***; he has hypoglycemia awareness ~100. Highest sugar was  518 >> ***.  Has a One Touch Ultra meter.   - + CKD, last BUN/creatinine:  Lab Results  Component Value Date   BUN 10 10/13/2017   CREATININE 1.21 10/13/2017  Pt is seen by nephrology - proteinuria (555 mg/24h). On Avapro - Dr. Hollie Salk. -+ HL. Last set of lipids: Lab Results  Component Value Date   CHOL 191 07/24/2017   HDL 40.30 07/24/2017   LDLCALC 120 (H) 07/24/2017   LDLDIRECT 144.0 07/08/2015   TRIG 157.0 (H) 07/24/2017   CHOLHDL 5 07/24/2017  On Pravastatin. - last eye exam was in 04/2017 (Dr Zigmund Daniel): + DR.  Had cataracts  sx. - + numbness and tingling in his feet. Sees podiatry. On ASA 81.  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 He was then in cardiac rehabilitation >> then admitted again 05/2017 for CHF exacerbation.  ROS: Constitutional: no weight gain/no weight loss, no fatigue, no subjective hyperthermia, no subjective hypothermia Eyes: no blurry vision, no xerophthalmia ENT: no sore throat, no nodules palpated in throat, no dysphagia, no odynophagia, no hoarseness Cardiovascular: no CP/no SOB/no palpitations/no leg swelling Respiratory: no cough/no SOB/no wheezing Gastrointestinal: no N/no V/no D/no C/no acid reflux Musculoskeletal: no muscle aches/no joint aches Skin: no rashes, no hair loss Neurological: no tremors/+ numbness/+ tingling/no dizziness  I reviewed pt's medications, allergies, PMH, social hx, family hx, and changes were documented in the history of present illness. Otherwise, unchanged from my initial visit note.  PE: There were no vitals taken for this visit. There is no height or weight on file to calculate BMI. Wt Readings from Last 3 Encounters:  11/07/17 277 lb 6.4 oz (125.8 kg)  11/06/17 278 lb 6.4 oz (126.3 kg)  08/04/17 275 lb (124.7 kg)   Constitutional: overweight, in NAD Eyes: PERRLA, EOMI, no exophthalmos ENT: moist mucous membranes, no thyromegaly, no cervical lymphadenopathy Cardiovascular: RRR, No MRG, + LE edema Respiratory: CTA B Gastrointestinal:  abdomen soft, NT, ND, BS+ Musculoskeletal: no deformities, strength intact in all 4 Skin: moist, warm, no rashes Neurological: no tremor with outstretched hands, DTR normal in all 4  ASSESSMENT: 1. DM2, insulin-dependent, uncontrolled, with complications - CAD H/o steroid inj's  2. Obesity class 3  Cardiologist: Dr Tamala Julian. Pulmonologist: Dr Lake Bells  PLAN:  1. Patient with DM2, uncontrolled, prev. Doing well on U500 insulin >> now too expensive. His control deteriorated after changing to premixed insulin and we changed  to basal-bolus insulin regimen (with N and R insulins 2/2 cost) at last visit.  - I suggested to:  Patient Instructions   Please continue: Insulin Before breakfast Before lunch Before dinner  Regular 40 40 40  NPH 30  30  Please inject the insulin 30 min before meals.  Please return in 3 months with your sugar log.   - reviewed latest HbA1c >> very high: 9.8% - continue checking sugars at different times of the day - check 1x a day, rotating checks - advised for yearly eye exams >> he is UTD - Return to clinic in 3 mo with sugar log    2. Obesity class 3 -   Philemon Kingdom, MD PhD [facility]

## 2018-01-01 ENCOUNTER — Telehealth: Payer: Self-pay | Admitting: Neurology

## 2018-01-01 NOTE — Telephone Encounter (Signed)
I will request for Edward [name] to call him to put him on waiting list.

## 2018-01-01 NOTE — Telephone Encounter (Signed)
Patient called and said that his Tremors have gotten a lot worse. The next follow up slot is in August. Please Advise. Thanks

## 2018-01-02 DIAGNOSIS — D23121 Other benign neoplasm of skin of left upper eyelid, including canthus: Secondary | ICD-10-CM | POA: Diagnosis not present

## 2018-01-02 DIAGNOSIS — H02423 Myogenic ptosis of bilateral eyelids: Secondary | ICD-10-CM | POA: Diagnosis not present

## 2018-01-02 DIAGNOSIS — D23122 Other benign neoplasm of skin of left lower eyelid, including canthus: Secondary | ICD-10-CM | POA: Diagnosis not present

## 2018-01-02 DIAGNOSIS — D23112 Other benign neoplasm of skin of right lower eyelid, including canthus: Secondary | ICD-10-CM | POA: Diagnosis not present

## 2018-01-02 DIAGNOSIS — H01024 Squamous blepharitis left upper eyelid: Secondary | ICD-10-CM | POA: Diagnosis not present

## 2018-01-02 DIAGNOSIS — H05223 Edema of bilateral orbit: Secondary | ICD-10-CM | POA: Diagnosis not present

## 2018-01-02 DIAGNOSIS — H01022 Squamous blepharitis right lower eyelid: Secondary | ICD-10-CM | POA: Diagnosis not present

## 2018-01-02 DIAGNOSIS — H01025 Squamous blepharitis left lower eyelid: Secondary | ICD-10-CM | POA: Diagnosis not present

## 2018-01-02 DIAGNOSIS — H0589 Other disorders of orbit: Secondary | ICD-10-CM | POA: Diagnosis not present

## 2018-01-02 DIAGNOSIS — D23111 Other benign neoplasm of skin of right upper eyelid, including canthus: Secondary | ICD-10-CM | POA: Diagnosis not present

## 2018-01-02 DIAGNOSIS — Z09 Encounter for follow-up examination after completed treatment for conditions other than malignant neoplasm: Secondary | ICD-10-CM | POA: Diagnosis not present

## 2018-01-02 DIAGNOSIS — H01021 Squamous blepharitis right upper eyelid: Secondary | ICD-10-CM | POA: Diagnosis not present

## 2018-01-16 ENCOUNTER — Other Ambulatory Visit: Payer: Self-pay

## 2018-01-16 MED ORDER — "INSULIN SYRINGE-NEEDLE U-100 31G X 5/16"" 1 ML MISC": 5 refills | Status: DC

## 2018-01-19 ENCOUNTER — Ambulatory Visit (INDEPENDENT_AMBULATORY_CARE_PROVIDER_SITE_OTHER): Payer: Medicare Other | Admitting: Neurology

## 2018-01-19 ENCOUNTER — Telehealth: Payer: Self-pay | Admitting: Neurology

## 2018-01-19 ENCOUNTER — Encounter: Payer: Self-pay | Admitting: Neurology

## 2018-01-19 VITALS — BP 160/74 | HR 80 | Ht 68.0 in | Wt 287.5 lb

## 2018-01-19 DIAGNOSIS — E0842 Diabetes mellitus due to underlying condition with diabetic polyneuropathy: Secondary | ICD-10-CM

## 2018-01-19 DIAGNOSIS — I25810 Atherosclerosis of coronary artery bypass graft(s) without angina pectoris: Secondary | ICD-10-CM | POA: Diagnosis not present

## 2018-01-19 DIAGNOSIS — G250 Essential tremor: Secondary | ICD-10-CM | POA: Diagnosis not present

## 2018-01-19 MED ORDER — PROPRANOLOL HCL 40 MG PO TABS: 40.0000 mg | ORAL_TABLET | Freq: Three times a day (TID) | ORAL | 3 refills | Status: DC

## 2018-01-19 MED ORDER — PRIMIDONE 50 MG PO TABS: ORAL_TABLET | ORAL | 5 refills | Status: DC

## 2018-01-19 NOTE — Patient Instructions (Addendum)
Start primidone 50mg  - take half tablet at bedtime for one week, then increase to half tablet twice daily  Continue propranolol 40mg  three times daily  Continue gabapentin 300mg  twice daily  OK to stay off the clonazepam  Call with an update in 6 weeks  Return to clinic in 4 months

## 2018-01-19 NOTE — Telephone Encounter (Signed)
Patient lmom needing to speak with you. He did not say why. Thanks

## 2018-01-19 NOTE — Telephone Encounter (Signed)
Left message for patient to call me back. 

## 2018-01-19 NOTE — Progress Notes (Signed)
Follow-up Visit   Date: 01/19/18    [name] MRN: [id number] DOB: Dec 14, 1940   Interim History: Edward [name] is a 77 y.o. right-handed African American male with coronary artery disease, TIA (2002, manifested as right sided numbness), insulin dependent diabetes mellitus type 2, hypertension, hyperlipidemia, GERD and BPH returning to the clinic for follow-up of essential tremor.  The patient was accompanied to the clinic by self.  History of present illness: He moved from [place] in February 2015 to be closer to his brother. He has been diabetic since 1985 and in late 1990s, he developed numbness > tingling of the feet which seems to have been stable since onset. He has paresthesias below the level of the ankles, worse on the right side, as well as interemittent numbness/tingling of the right hand. He ambulates independently. He was started neurontin 100mg  twice daily.  He was seeing Dr. Gery Pray in [place] for memory loss and headaches in 2014. Headaches improved significantly after he retired. He has short-term memory problems, which does not interefere with daily life. Mostly forgetting little details or tasks. He is driving and has not been involving in any accidents. He is able to do all his IADLs and ADLs.   Starting in 2015, he began having tremors of the hands and voice.  Family history was also notable for tremor in sister, mother, and two brothers have tremors.  He was started on propranolol 40mg  TID which helped some. Over the years, his tremors have gradually worsened. He was started on gabapentin 300mg  BID and noticed that clonazepman which he was taking for anxiety, helped.  During the summer of 2018, he started having sharp headaches.  Imaging did not show any worrisome intracranial pathology.   UPDATE 01/19/2018:  He is here for 9 month follow-up visit.  About a month ago, he stopped his clonazepam because it made him feel depressed and has noticed  worsening hand tremors as a result.  He continues to have headaches and treats this with tylenol.  Tremors continue to interfere with his daily activities such as cooking, eating, and handwriting.    Medications:  Current Outpatient Medications on File Prior to Visit  Medication Sig Dispense Refill  . acetaminophen (TYLENOL) 500 MG tablet Take 2 tablets (1,000 mg total) by mouth every 6 (six) hours as needed. 30 tablet 0  . albuterol (VENTOLIN HFA) 108 (90 Base) MCG/ACT inhaler inhale 2 puffs by mouth every 6 hours if needed for wheezing or shortness of breath 18 g 1  . amLODipine (NORVASC) 5 MG tablet Take 1 tablet (5 mg total) by mouth daily. 90 tablet 3  . aspirin EC 81 MG tablet Take 81 mg by mouth daily.    . clonazePAM (KLONOPIN) 0.5 MG tablet Take 1 tablet (0.5 mg total) by mouth 2 (two) times daily as needed (tremors). 45 tablet 3  . clopidogrel (PLAVIX) 75 MG tablet Take 1 tablet (75 mg total) by mouth daily. 90 tablet 3  . diclofenac sodium (VOLTAREN) 1 % GEL Apply 2 g topically 4 (four) times daily as needed (for pain).     . fluticasone furoate-vilanterol (BREO ELLIPTA) 100-25 MCG/INH AEPB Inhale 1 puff into the lungs daily. 1 each 2  . fluticasone furoate-vilanterol (BREO ELLIPTA) 100-25 MCG/INH AEPB Inhale 1 puff into the lungs daily. 1 each 0  . furosemide (LASIX) 80 MG tablet Take 1 tablet (80 mg total) by mouth daily. 90 tablet 3  . gabapentin (NEURONTIN) 300 MG capsule take  1 capsule by mouth at bedtime for 1 week then INCREASE TO 1 CAP TWICE DAILY 60 capsule 11  . HYDROcodone-acetaminophen (NORCO/VICODIN) 5-325 MG tablet Take 1 tablet by mouth every 4 (four) hours as needed. 10 tablet 0  . insulin NPH Human (HUMULIN N) 100 UNIT/ML injection Inject under skin 30 units 2x a day - ReliOn 20 mL 5  . Insulin Syringe-Needle U-100 (BD INSULIN SYRINGE U/F) 31G X 5/16" 1 ML MISC Use 4 times per day to inject insulin. 300 each 5  . isosorbide mononitrate (IMDUR) 30 MG 24 hr tablet Take  15 mg by mouth daily.  0  . meclizine (ANTIVERT) 25 MG tablet Take 1 tablet (25 mg total) by mouth 2 (two) times daily as needed for dizziness. 30 tablet 1  . nitroGLYCERIN (NITROSTAT) 0.4 MG SL tablet Place 1 tablet (0.4 mg total) under the tongue every 5 (five) minutes as needed for chest pain. Max: 3 doses per episode. 25 tablet 3  . Polyvinyl Alcohol-Povidone (REFRESH OP) Place 1 drop into both eyes 3 (three) times daily as needed (for dry eyes). Reported on 12/25/2015    . potassium chloride SA (K-DUR,KLOR-CON) 20 MEQ tablet Take 1.5 tablets (30 mEq total) by mouth daily. 45 tablet 6  . pravastatin (PRAVACHOL) 20 MG tablet Take 1 tablet (20 mg total) by mouth daily. 30 tablet 3  . psyllium (METAMUCIL) 58.6 % packet Take 1 packet by mouth daily as needed (constipation).    . traMADol (ULTRAM) 50 MG tablet Take 1 tablet (50 mg total) by mouth every 6 (six) hours as needed. 12 tablet 0   No current facility-administered medications on file prior to visit.     Allergies:  Allergies  Allergen Reactions  . Pneumococcal Vaccines Anaphylaxis    Review of Systems:  CONSTITUTIONAL: No fevers, chills, night sweats, or weight loss.  EYES: No visual changes or eye pain ENT: No hearing changes.  No history of nose bleeds.   RESPIRATORY: No cough, wheezing and shortness of breath.   CARDIOVASCULAR: Negative for chest pain, and palpitations.   GI: Negative for abdominal discomfort, blood in stools or black stools.  No recent change in bowel habits.   GU:  No history of incontinence.   MUSCLOSKELETAL: +history of joint pain or swelling.  No myalgias.   SKIN: Negative for lesions, rash, and itching.   ENDOCRINE: Negative for cold or heat intolerance, polydipsia or goiter.   PSYCH:  no depression or anxiety symptoms.   NEURO: As Above.   Vital Signs:  BP (!) 160/74   Pulse 80   Ht 5\' 8"  (1.727 m)   Wt 287 lb 8 oz (130.4 kg)   SpO2 97%   BMI 43.71 kg/m   General:  Well appearing,  comfortable  Neurological Exam: MENTAL STATUS including orientation to time, place, person, recent and remote memory, attention span and concentration, language, and fund of knowledge is normal.  Speech is not dysarthric, tremulous pattern.  CRANIAL NERVES:   Face is symmetric.   MOTOR:  Motor strength is 5/5 in all extremities.  There is a mild hand tremor which is worse with finger to nose testing.  No pronator drift.  Tone is normal.    MSRs:  Reflexes are 2+/4 in the upper extremities and absent in the legs  COORDINATION/GAIT:  Normal finger-to- nose-finger.  Gait narrow based and stable.    Data: EMG 12/01/2014: 1. 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 Overall, these findings are moderately severe in degree electrically.  2. 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 There is no evidence of a cervical/lumbosacral radiculopathy affecting the right side  Lab Results  Component Value Date   HGBA1C 9.8 11/06/2017   Labs 04/08/2014:  Copper 64*, ceruloplasmin 18, vitamin B12 290, SPEP/UPEP with IFE no M protein  CT head 10/21/2014 and 01/15/2015:  Negative  CT head wo contrast 04/21/2016:  Negative  IMPRESSION/PLAN: Essential tremor involving the hands and voice, worsening since he self-tapered off clonazepam.    - Start primidone 50mg  - take half tablet at bedtime for one week, then increase to half tablet twice daily.    - Continue propranolol 40mg  TID.   - Continue gabapentin 300mg  twice daily  - Call with an update in 6 weeks to determine further titration of primidone  2.  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   MG antibody negative  3.  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  EMG with moderately severe neuropathy affecting the right side.    Stable.   Return to clinic in 4 months  Greater than 50% of this 25 minute visit was spent in counseling, explanation of diagnosis, planning of further management, and coordination of care.    Thank you for allowing me to participate in patient's care.  If I can answer any additional questions, I would be pleased to do so.    Sincerely,    Donika K. Posey Pronto, DO

## 2018-01-22 ENCOUNTER — Other Ambulatory Visit: Payer: Self-pay | Admitting: *Deleted

## 2018-01-22 NOTE — Telephone Encounter (Signed)
Patient called back and said that the primidone pills are hard to cut in half.  Instructed him to take them to the pharmacy and to purchase a pill cutter.  They may assist him in cutting his medication.

## 2018-01-28 NOTE — Progress Notes (Signed)
Cardiology Office Note    Date:  01/29/2018   ID:  [name], DOB 15-Apr-1941, MRN [id number]  PCP:  Ann Held, DO  Cardiologist: Sinclair Grooms, MD   Chief Complaint  Patient presents with  . Coronary Artery Disease  . Shortness of Breath    History of Present Illness:  [name] is a 77 y.o. male who presents for follow-up of CAD with April 2016 drug-eluting stent and February 2017 unstable angina due to high-grade circumflex being treated with medical therapy. CABG x2 for ostial circumflex disease. Recent hospitalization for acute on chronic diastolic heart failure.  He is doing okay.  Has some difficulty with his breathing.  He occasionally takes an extra dose of furosemide.  He takes furosemide 40 mg twice daily.  Records indicate 80 mg once per day.  He will occasionally take an extra 40 mg.  He denies orthopnea, and PND.  He has not had angina.  No nitroglycerin use.  He denies significant lower extremity swelling.  Has not had furosemide yet today.   Past Medical History:  Diagnosis Date  . Adenomatous colon polyp 04/2011  . CAD (coronary artery disease)    a. 01/2015 DES to LAD  b. 12/2015: [place] 85% oLCx lesion--> rx therapy.  . Chronic diastolic CHF (congestive heart failure) ([place])    a. 05/2017 Echo: EF 60-65%, Gr1 DD, no rwma, mildly dil LA, nl RV fxn, mild TR.  . CKD (chronic kidney disease), stage II    a. probable CKD II-III with baseline CR 1.1-1.3.  . DM type 2 (diabetes mellitus, type 2), insulin dependent 01/29/2014   fasting cbg 50-120 with new regimen  . Dyslipidemia, goal LDL below 70 01/29/2014  . Enlarged prostate   . Essential tremor    a. on proprnolol  . GERD (gastroesophageal reflux disease)   . Hypertension   . Internal hemorrhoid   . Sleep apnea    does not use cpap (06/04/2015)  . TIA (transient ischemic attack) 2002    Past Surgical History:  Procedure Laterality Date  . BACK SURGERY    . CARDIAC CATHETERIZATION  01/28/14   + CAD treat medically  . CARDIAC CATHETERIZATION N/A 12/21/2015   Procedure: Right/Left Heart Cath and Coronary Angiography;  Surgeon: Belva Crome, MD;  Location: [facility];  Service: Cardiovascular;  Laterality: N/A;  . CARDIAC CATHETERIZATION N/A 11/02/2016   Procedure: Left Heart Cath and Coronary Angiography;  Surgeon: Nelva Bush, MD;  Location: [facility];  Service: Cardiovascular;  Laterality: N/A;  . CATARACT EXTRACTION Bilateral   . CATARACT EXTRACTION, BILATERAL Bilateral   . CORONARY ANGIOPLASTY WITH STENT PLACEMENT  01/30/2015   DES [place]  [place] to LAD by Dr Tamala Julian  . CORONARY ARTERY BYPASS GRAFT N/A 11/09/2016   Procedure: CORONARY ARTERY BYPASS GRAFTING (CABG) x 2;  Surgeon: Melrose Nakayama, MD;  Location: [facility];  Service: Open Heart Surgery;  Laterality: N/A;  . ENDOVEIN HARVEST OF GREATER SAPHENOUS VEIN Left 11/09/2016   Procedure: ENDOVEIN HARVEST OF GREATER SAPHENOUS VEIN;  Surgeon: Melrose Nakayama, MD;  Location: [facility];  Service: Open Heart Surgery;  Laterality: Left;  . JOINT REPLACEMENT    . LEFT HEART CATHETERIZATION WITH CORONARY ANGIOGRAM N/A 01/28/2014   Procedure: LEFT HEART CATHETERIZATION WITH CORONARY ANGIOGRAM;  Surgeon: Sinclair Grooms, MD;  Location: [facility] CATH LAB;  Service: Cardiovascular;  Laterality: N/A;  . LEFT HEART CATHETERIZATION WITH CORONARY ANGIOGRAM N/A  01/30/2015   Procedure: LEFT HEART CATHETERIZATION WITH CORONARY ANGIOGRAM;  Surgeon: Belva Crome, MD;  Location: [facility] CATH LAB;  Service: Cardiovascular;  Laterality: N/A;  . LUMBAR [place]    . SHOULDER ARTHROSCOPY Right 07/30/2014   Procedure: Right Shoulder Arthroscopy, Debridement, Decompression, Manipulation Under Anesthesia;  Surgeon: Newt Minion, MD;  Location: [facility];  Service: Orthopedics;  Laterality: Right;  . TEE WITHOUT CARDIOVERSION N/A 11/09/2016   Procedure: TRANSESOPHAGEAL ECHOCARDIOGRAM (TEE);  Surgeon: Melrose Nakayama, MD;  Location: [place];   Service: Open Heart Surgery;  Laterality: N/A;  . TOTAL KNEE ARTHROPLASTY Bilateral     Current Medications: Outpatient Medications Prior to Visit  Medication Sig Dispense Refill  . acetaminophen (TYLENOL) 500 MG tablet Take 2 tablets (1,000 mg total) by mouth every 6 (six) hours as needed. 30 tablet 0  . albuterol (VENTOLIN HFA) 108 (90 Base) MCG/ACT inhaler inhale 2 puffs by mouth every 6 hours if needed for wheezing or shortness of breath 18 g 1  . amLODipine (NORVASC) 5 MG tablet Take 1 tablet (5 mg total) by mouth daily. 90 tablet 3  . aspirin EC 81 MG tablet Take 81 mg by mouth daily.    . clonazePAM (KLONOPIN) 0.5 MG tablet Take 1 tablet (0.5 mg total) by mouth 2 (two) times daily as needed (tremors). 45 tablet 3  . clopidogrel (PLAVIX) 75 MG tablet Take 1 tablet (75 mg total) by mouth daily. 90 tablet 3  . diclofenac sodium (VOLTAREN) 1 % GEL Apply 2 g topically 4 (four) times daily as needed (for pain).     . fluticasone furoate-vilanterol (BREO ELLIPTA) 100-25 MCG/INH AEPB Inhale 1 puff into the lungs daily. 1 each 2  . furosemide (LASIX) 80 MG tablet Take 1 tablet (80 mg total) by mouth daily. 90 tablet 3  . gabapentin (NEURONTIN) 300 MG capsule take 1 capsule by mouth at bedtime for 1 week then INCREASE TO 1 CAP TWICE DAILY 60 capsule 11  . HYDROcodone-acetaminophen (NORCO/VICODIN) 5-325 MG tablet Take 1 tablet by mouth every 4 (four) hours as needed. 10 tablet 0  . insulin NPH Human (HUMULIN N) 100 UNIT/ML injection Inject under skin 30 units 2x a day - ReliOn 20 mL 5  . Insulin Syringe-Needle U-100 (BD INSULIN SYRINGE U/F) 31G X 5/16" 1 ML MISC Use 4 times per day to inject insulin. 300 each 5  . isosorbide mononitrate (IMDUR) 30 MG 24 hr tablet Take 15 mg by mouth daily.  0  . meclizine (ANTIVERT) 25 MG tablet Take 1 tablet (25 mg total) by mouth 2 (two) times daily as needed for dizziness. 30 tablet 1  . nitroGLYCERIN (NITROSTAT) 0.4 MG SL tablet Place 1 tablet (0.4 mg total)  under the tongue every 5 (five) minutes as needed for chest pain. Max: 3 doses per episode. 25 tablet 3  . Polyvinyl Alcohol-Povidone (REFRESH OP) Place 1 drop into both eyes 3 (three) times daily as needed (for dry eyes). Reported on 12/25/2015    . potassium chloride SA (K-DUR,KLOR-CON) 20 MEQ tablet Take 1.5 tablets (30 mEq total) by mouth daily. 45 tablet 6  . primidone (MYSOLINE) 50 MG tablet Take 25 mg by mouth 2 (two) times daily.    . propranolol (INDERAL) 40 MG tablet Take 1 tablet (40 mg total) by mouth 3 (three) times daily. 270 tablet 3  . psyllium (METAMUCIL) 58.6 % packet Take 1 packet by mouth daily as needed (constipation).    . traMADol (ULTRAM) 50 MG  tablet Take 1 tablet (50 mg total) by mouth every 6 (six) hours as needed. 12 tablet 0  . pravastatin (PRAVACHOL) 20 MG tablet Take 1 tablet (20 mg total) by mouth daily. 30 tablet 3  . fluticasone furoate-vilanterol (BREO ELLIPTA) 100-25 MCG/INH AEPB Inhale 1 puff into the lungs daily. (Patient not taking: Reported on 01/29/2018) 1 each 0  . primidone (MYSOLINE) 50 MG tablet Take half tablet at bedtime x 1 week, then take half tablet twice daily (Patient not taking: Reported on 01/29/2018) 30 tablet 5   No facility-administered medications prior to visit.      Allergies:   Pneumococcal vaccines   Social History   Socioeconomic History  . Marital status: Married    Spouse name: Not on file  . Number of children: 0  . Years of education: Not on file  . Highest education level: Not on file  Occupational History  . Occupation: retired  Scientific laboratory technician  . Financial resource strain: Not on file  . Food insecurity:    Worry: Not on file    Inability: Not on file  . Transportation needs:    Medical: Not on file    Non-medical: Not on file  Tobacco Use  . Smoking status: Never Smoker  . Smokeless tobacco: Never Used  Substance and Sexual Activity  . Alcohol use: No    Alcohol/week: 0.0 oz  . Drug use: No  . Sexual activity:  Never  Lifestyle  . Physical activity:    Days per week: Not on file    Minutes per session: Not on file  . Stress: Not on file  Relationships  . Social connections:    Talks on phone: Not on file    Gets together: Not on file    Attends religious service: Not on file    Active member of club or organization: Not on file    Attends meetings of clubs or organizations: Not on file    Relationship status: Not on file  Other Topics Concern  . Not on file  Social History Narrative   He is a Geophysicist/field seismologist by trade.   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   He lives alone.  His wife is living in [place] at this time.  They do not have children.   Highest level of education:  College graduate     Family History:  The patient's family history includes Alzheimer's disease in his sister; Asthma in his brother; CAD in his brother and sister; Diabetes in his other; Prostate cancer in his brother.   ROS:   Please see the history of present illness.    Difficulty with tremor, leg swelling, recent chills, DOE, back pain, muscle pain, wheezing, and tremor. All other systems reviewed and are negative.   PHYSICAL EXAM:   VS:  BP (!) 142/78   Pulse 79   Ht 5\' 8"  (1.727 m)   Wt 279 lb (126.6 kg)   BMI 42.42 kg/m    GEN: Well nourished, well developed, in no acute distress  HEENT: normal  Neck: no JVD, carotid bruits, or masses Cardiac: RRR; no murmurs, rubs, or gallops,no edema  Respiratory:  clear to auscultation bilaterally, normal work of breathing GI: soft, nontender, nondistended, + BS MS: no deformity or atrophy  Skin: warm and dry, no rash Neuro:  Alert and Oriented x 3, Strength and sensation are intact Psych: euthymic mood, full affect  Wt Readings from Last 3 Encounters:  01/29/18  279 lb (126.6 kg)  01/19/18 287 lb 8 oz (130.4 kg)  11/07/17 277 lb 6.4 oz (125.8 kg)      Studies/Labs Reviewed:   EKG:  EKG from October 13, 2017 reveals sinus  rhythm, first-degree AV block, poor R wave progression.  No acute change compared to prior.  Recent Labs: 06/06/2017: TSH 1.362 07/16/2017: Magnesium 1.7 10/13/2017: ALT 17; B Natriuretic Peptide 27.3; BUN 10; Creatinine, Ser 1.21; Hemoglobin 12.8; Platelets 216; Potassium 3.8; Sodium 139   Lipid Panel    Component Value Date/Time   CHOL 191 07/24/2017 1145   TRIG 157.0 (H) 07/24/2017 1145   HDL 40.30 07/24/2017 1145   CHOLHDL 5 07/24/2017 1145   VLDL 31.4 07/24/2017 1145   LDLCALC 120 (H) 07/24/2017 1145   LDLDIRECT 144.0 07/08/2015 1344    Additional studies/ records that were reviewed today include:  None    ASSESSMENT:    1. Coronary artery disease involving coronary bypass graft of native heart with angina pectoris ([place])   2. Hyperlipidemia LDL goal <70   3. Chronic kidney disease, stage III (moderate) (HCC)   4. Type 2 diabetes mellitus with other circulatory complication, with long-term current use of insulin ([place])   5. Chronic diastolic heart failure (HCC)      PLAN:  In order of problems listed above:  1. Stable with reference to angina.  Does have some dyspnea likely related to diastolic heart failure.  Doubt active ischemia. 2. LDL was 120 and target should be less than 70.  Change to rosuvastatin 20 mg/day and discontinue pravastatin.  A lipid panel will be done in 1 month. 3. A comprehensive metabolic panel will be done in 1 month. 4. Not addressed 5. Takes an occasional extra 40 mg of Lasix if he feels short of breath or swollen.  No clinical evidence of volume overload on today's exam.   Stable overall.  Low-salt diet discussed.  Discussed more aggressive lipid management.  Changed to rosuvastatin 20 mg/day.  Six-month clinical follow-up with team member.  20-month follow-up with me.    Medication Adjustments/Labs and Tests Ordered: Current medicines are reviewed at length with the patient today.  Concerns regarding medicines are outlined above.   Medication changes, Labs and Tests ordered today are listed in the Patient Instructions below. Patient Instructions  Medication Instructions:  1) DISCONTINUE Pravastatin 2) START Rosuvastatin 20mg  once daily  Labwork: Your physician recommends that you return for lab work in: 1 month (CMET, Lipid)   Testing/Procedures: None  Follow-Up: Your physician recommends that you schedule a follow-up appointment in: 6 months with Cecilie Kicks, NP or Truitt Merle, NP.  Your physician wants you to follow-up in: 1 year with Dr. Tamala Julian.  You will receive a reminder letter in the mail two months in advance. If you don't receive a letter, please call our office to schedule the follow-up appointment.    Any Other Special Instructions Will Be Listed Below (If Applicable).     If you need a refill on your cardiac medications before your next appointment, please call your pharmacy.      Signed, Sinclair Grooms, MD  01/29/2018 9:35 AM    [facility] [place], [place], [place]  [address] Phone: [phone]; Fax: [phone]

## 2018-01-29 ENCOUNTER — Encounter: Payer: Self-pay | Admitting: Interventional Cardiology

## 2018-01-29 ENCOUNTER — Ambulatory Visit (INDEPENDENT_AMBULATORY_CARE_PROVIDER_SITE_OTHER): Payer: Medicare Other | Admitting: Interventional Cardiology

## 2018-01-29 ENCOUNTER — Telehealth: Payer: Self-pay | Admitting: *Deleted

## 2018-01-29 VITALS — BP 142/78 | HR 79 | Ht 68.0 in | Wt 279.0 lb

## 2018-01-29 DIAGNOSIS — Z794 Long term (current) use of insulin: Secondary | ICD-10-CM | POA: Diagnosis not present

## 2018-01-29 DIAGNOSIS — I25709 Atherosclerosis of coronary artery bypass graft(s), unspecified, with unspecified angina pectoris: Secondary | ICD-10-CM | POA: Diagnosis not present

## 2018-01-29 DIAGNOSIS — E785 Hyperlipidemia, unspecified: Secondary | ICD-10-CM

## 2018-01-29 DIAGNOSIS — N183 Chronic kidney disease, stage 3 unspecified: Secondary | ICD-10-CM

## 2018-01-29 DIAGNOSIS — I5032 Chronic diastolic (congestive) heart failure: Secondary | ICD-10-CM

## 2018-01-29 DIAGNOSIS — E1159 Type 2 diabetes mellitus with other circulatory complications: Secondary | ICD-10-CM

## 2018-01-29 DIAGNOSIS — I25810 Atherosclerosis of coronary artery bypass graft(s) without angina pectoris: Secondary | ICD-10-CM

## 2018-01-29 MED ORDER — ROSUVASTATIN CALCIUM 20 MG PO TABS: 20.0000 mg | ORAL_TABLET | Freq: Every day | ORAL | 3 refills | Status: DC

## 2018-01-29 NOTE — Patient Instructions (Signed)
Medication Instructions:  1) DISCONTINUE Pravastatin 2) START Rosuvastatin 20mg  once daily  Labwork: Your physician recommends that you return for lab work in: 1 month (CMET, Lipid)   Testing/Procedures: None  Follow-Up: Your physician recommends that you schedule a follow-up appointment in: 6 months with Cecilie Kicks, NP or Truitt Merle, NP.  Your physician wants you to follow-up in: 1 year with Dr. Tamala Julian.  You will receive a reminder letter in the mail two months in advance. If you don't receive a letter, please call our office to schedule the follow-up appointment.    Any Other Special Instructions Will Be Listed Below (If Applicable).     If you need a refill on your cardiac medications before your next appointment, please call your pharmacy.

## 2018-01-29 NOTE — Telephone Encounter (Signed)
Received document/letter in mail sent by patient to inform his PCP/Dr. Carollee Herter that "because of the location of the [facility] office, Lissa Merlin and I have chosen to select another provider in [place], preferably at [facility]", he is requesting "referrral" to make the change; forwarded to provider/SLS 04/01

## 2018-02-02 ENCOUNTER — Telehealth: Payer: Self-pay | Admitting: *Deleted

## 2018-02-02 NOTE — Telephone Encounter (Signed)
Thank you :)

## 2018-02-02 NOTE — Telephone Encounter (Signed)
Pt had requested to be referred to [name] location for shorter travel. Called pt to notify him that [name] is not currently taking new pts at this time. All other [place] locations discussed. Pt states he will give his options some thought and call location of his choice on his own. Pt wanted it to be known how much he loves Dr.Lowne and all of the staff at [name] location. Instructed pt to call our office if we may be of further assistance.

## 2018-02-06 ENCOUNTER — Ambulatory Visit: Payer: Medicare Other | Admitting: Family Medicine

## 2018-03-06 ENCOUNTER — Other Ambulatory Visit: Payer: Medicare Other

## 2018-03-07 ENCOUNTER — Other Ambulatory Visit: Payer: Medicare Other | Admitting: *Deleted

## 2018-03-07 DIAGNOSIS — E785 Hyperlipidemia, unspecified: Secondary | ICD-10-CM

## 2018-03-07 DIAGNOSIS — I25709 Atherosclerosis of coronary artery bypass graft(s), unspecified, with unspecified angina pectoris: Secondary | ICD-10-CM

## 2018-03-07 LAB — COMPREHENSIVE METABOLIC PANEL
ALK PHOS: 100 IU/L (ref 39–117)
ALT: 17 IU/L (ref 0–44)
AST: 18 IU/L (ref 0–40)
Albumin/Globulin Ratio: 1.4 (ref 1.2–2.2)
Albumin: 3.7 g/dL (ref 3.5–4.8)
BILIRUBIN TOTAL: 0.5 mg/dL (ref 0.0–1.2)
BUN / CREAT RATIO: 10 (ref 10–24)
BUN: 11 mg/dL (ref 8–27)
CHLORIDE: 100 mmol/L (ref 96–106)
CO2: 27 mmol/L (ref 20–29)
Calcium: 9 mg/dL (ref 8.6–10.2)
Creatinine, Ser: 1.09 mg/dL (ref 0.76–1.27)
GFR calc Af Amer: 76 mL/min/{1.73_m2} (ref >59)
GFR calc non Af Amer: 66 mL/min/{1.73_m2} (ref >59)
Globulin, Total: 2.7 g/dL (ref 1.5–4.5)
Glucose: 148 mg/dL — ABNORMAL HIGH (ref 65–99)
Potassium: 4.1 mmol/L (ref 3.5–5.2)
SODIUM: 140 mmol/L (ref 134–144)
TOTAL PROTEIN: 6.4 g/dL (ref 6.0–8.5)

## 2018-03-07 LAB — LIPID PANEL
CHOL/HDL RATIO: 4.8 ratio (ref 0.0–5.0)
Cholesterol, Total: 183 mg/dL (ref 100–199)
HDL: 38 mg/dL — ABNORMAL LOW (ref >39)
LDL CALC: 128 mg/dL — AB (ref 0–99)
TRIGLYCERIDES: 87 mg/dL (ref 0–149)
VLDL Cholesterol Cal: 17 mg/dL (ref 5–40)

## 2018-03-08 ENCOUNTER — Inpatient Hospital Stay (HOSPITAL_COMMUNITY)
Admission: EM | Admit: 2018-03-08 | Discharge: 2018-03-11 | DRG: 286 | Disposition: A | Discharge status: Home / Self Care | Payer: Medicare Other | Attending: Cardiovascular Disease | Admitting: Cardiovascular Disease

## 2018-03-08 ENCOUNTER — Other Ambulatory Visit: Payer: Self-pay

## 2018-03-08 ENCOUNTER — Emergency Department (HOSPITAL_COMMUNITY): Payer: Medicare Other

## 2018-03-08 ENCOUNTER — Encounter (HOSPITAL_COMMUNITY): Payer: Self-pay | Admitting: Emergency Medicine

## 2018-03-08 DIAGNOSIS — Z6841 Body Mass Index (BMI) 40.0 and over, adult: Secondary | ICD-10-CM | POA: Diagnosis not present

## 2018-03-08 DIAGNOSIS — Z96653 Presence of artificial knee joint, bilateral: Secondary | ICD-10-CM | POA: Diagnosis present

## 2018-03-08 DIAGNOSIS — Z7982 Long term (current) use of aspirin: Secondary | ICD-10-CM

## 2018-03-08 DIAGNOSIS — E785 Hyperlipidemia, unspecified: Secondary | ICD-10-CM | POA: Diagnosis present

## 2018-03-08 DIAGNOSIS — G250 Essential tremor: Secondary | ICD-10-CM | POA: Diagnosis present

## 2018-03-08 DIAGNOSIS — Z951 Presence of aortocoronary bypass graft: Secondary | ICD-10-CM

## 2018-03-08 DIAGNOSIS — I130 Hypertensive heart and chronic kidney disease with heart failure and stage 1 through stage 4 chronic kidney disease, or unspecified chronic kidney disease: Secondary | ICD-10-CM | POA: Diagnosis not present

## 2018-03-08 DIAGNOSIS — Z887 Allergy status to serum and vaccine status: Secondary | ICD-10-CM

## 2018-03-08 DIAGNOSIS — Z9842 Cataract extraction status, left eye: Secondary | ICD-10-CM

## 2018-03-08 DIAGNOSIS — I5033 Acute on chronic diastolic (congestive) heart failure: Secondary | ICD-10-CM | POA: Diagnosis present

## 2018-03-08 DIAGNOSIS — Z8673 Personal history of transient ischemic attack (TIA), and cerebral infarction without residual deficits: Secondary | ICD-10-CM

## 2018-03-08 DIAGNOSIS — E1122 Type 2 diabetes mellitus with diabetic chronic kidney disease: Secondary | ICD-10-CM | POA: Diagnosis present

## 2018-03-08 DIAGNOSIS — I2582 Chronic total occlusion of coronary artery: Secondary | ICD-10-CM | POA: Diagnosis present

## 2018-03-08 DIAGNOSIS — Y831 Surgical operation with implant of artificial internal device as the cause of abnormal reaction of the patient, or of later complication, without mention of misadventure at the time of the procedure: Secondary | ICD-10-CM | POA: Diagnosis present

## 2018-03-08 DIAGNOSIS — N179 Acute kidney failure, unspecified: Secondary | ICD-10-CM | POA: Diagnosis not present

## 2018-03-08 DIAGNOSIS — I5032 Chronic diastolic (congestive) heart failure: Secondary | ICD-10-CM | POA: Diagnosis present

## 2018-03-08 DIAGNOSIS — Z833 Family history of diabetes mellitus: Secondary | ICD-10-CM

## 2018-03-08 DIAGNOSIS — Z8249 Family history of ischemic heart disease and other diseases of the circulatory system: Secondary | ICD-10-CM

## 2018-03-08 DIAGNOSIS — R079 Chest pain, unspecified: Secondary | ICD-10-CM | POA: Diagnosis not present

## 2018-03-08 DIAGNOSIS — T82855A Stenosis of coronary artery stent, initial encounter: Secondary | ICD-10-CM | POA: Diagnosis present

## 2018-03-08 DIAGNOSIS — Z825 Family history of asthma and other chronic lower respiratory diseases: Secondary | ICD-10-CM

## 2018-03-08 DIAGNOSIS — I25709 Atherosclerosis of coronary artery bypass graft(s), unspecified, with unspecified angina pectoris: Secondary | ICD-10-CM | POA: Diagnosis present

## 2018-03-08 DIAGNOSIS — R0789 Other chest pain: Secondary | ICD-10-CM | POA: Diagnosis not present

## 2018-03-08 DIAGNOSIS — Z8042 Family history of malignant neoplasm of prostate: Secondary | ICD-10-CM

## 2018-03-08 DIAGNOSIS — G4733 Obstructive sleep apnea (adult) (pediatric): Secondary | ICD-10-CM | POA: Diagnosis present

## 2018-03-08 DIAGNOSIS — E1140 Type 2 diabetes mellitus with diabetic neuropathy, unspecified: Secondary | ICD-10-CM | POA: Diagnosis present

## 2018-03-08 DIAGNOSIS — I25708 Atherosclerosis of coronary artery bypass graft(s), unspecified, with other forms of angina pectoris: Secondary | ICD-10-CM | POA: Diagnosis not present

## 2018-03-08 DIAGNOSIS — Z86010 Personal history of colonic polyps: Secondary | ICD-10-CM

## 2018-03-08 DIAGNOSIS — Z9841 Cataract extraction status, right eye: Secondary | ICD-10-CM

## 2018-03-08 DIAGNOSIS — Z794 Long term (current) use of insulin: Secondary | ICD-10-CM

## 2018-03-08 DIAGNOSIS — I10 Essential (primary) hypertension: Secondary | ICD-10-CM | POA: Diagnosis present

## 2018-03-08 DIAGNOSIS — Z820 Family history of epilepsy and other diseases of the nervous system: Secondary | ICD-10-CM

## 2018-03-08 DIAGNOSIS — I25118 Atherosclerotic heart disease of native coronary artery with other forms of angina pectoris: Secondary | ICD-10-CM

## 2018-03-08 DIAGNOSIS — K219 Gastro-esophageal reflux disease without esophagitis: Secondary | ICD-10-CM | POA: Diagnosis present

## 2018-03-08 DIAGNOSIS — F419 Anxiety disorder, unspecified: Secondary | ICD-10-CM | POA: Diagnosis present

## 2018-03-08 DIAGNOSIS — I440 Atrioventricular block, first degree: Secondary | ICD-10-CM | POA: Diagnosis present

## 2018-03-08 DIAGNOSIS — Z79899 Other long term (current) drug therapy: Secondary | ICD-10-CM

## 2018-03-08 DIAGNOSIS — Z955 Presence of coronary angioplasty implant and graft: Secondary | ICD-10-CM

## 2018-03-08 DIAGNOSIS — Z7902 Long term (current) use of antithrombotics/antiplatelets: Secondary | ICD-10-CM

## 2018-03-08 DIAGNOSIS — N400 Enlarged prostate without lower urinary tract symptoms: Secondary | ICD-10-CM | POA: Diagnosis present

## 2018-03-08 DIAGNOSIS — N183 Chronic kidney disease, stage 3 (moderate): Secondary | ICD-10-CM | POA: Diagnosis present

## 2018-03-08 DIAGNOSIS — E119 Type 2 diabetes mellitus without complications: Secondary | ICD-10-CM

## 2018-03-08 DIAGNOSIS — I110 Hypertensive heart disease with heart failure: Secondary | ICD-10-CM | POA: Diagnosis not present

## 2018-03-08 DIAGNOSIS — E1165 Type 2 diabetes mellitus with hyperglycemia: Secondary | ICD-10-CM | POA: Diagnosis present

## 2018-03-08 LAB — COMPREHENSIVE METABOLIC PANEL
ALK PHOS: 79 U/L (ref 38–126)
ALT: 17 U/L (ref 17–63)
AST: 19 U/L (ref 15–41)
Albumin: 3 g/dL — ABNORMAL LOW (ref 3.5–5.0)
Anion gap: 7 (ref 5–15)
BUN: 9 mg/dL (ref 6–20)
CO2: 28 mmol/L (ref 22–32)
CREATININE: 1.09 mg/dL (ref 0.61–1.24)
Calcium: 8.6 mg/dL — ABNORMAL LOW (ref 8.9–10.3)
Chloride: 104 mmol/L (ref 101–111)
GFR calc Af Amer: >60 mL/min (ref >60)
GFR calc non Af Amer: >60 mL/min (ref >60)
Glucose, Bld: 94 mg/dL (ref 65–99)
Potassium: 4.1 mmol/L (ref 3.5–5.1)
SODIUM: 139 mmol/L (ref 135–145)
Total Bilirubin: 0.5 mg/dL (ref 0.3–1.2)
Total Protein: 5.8 g/dL — ABNORMAL LOW (ref 6.5–8.1)

## 2018-03-08 LAB — CBC WITH DIFFERENTIAL/PLATELET
Basophils Absolute: 0 10*3/uL (ref 0.0–0.1)
Basophils Relative: 0 %
Eosinophils Absolute: 0.2 10*3/uL (ref 0.0–0.7)
Eosinophils Relative: 3 %
HEMATOCRIT: 36.1 % — AB (ref 39.0–52.0)
HEMOGLOBIN: 11.9 g/dL — AB (ref 13.0–17.0)
LYMPHS ABS: 1.7 10*3/uL (ref 0.7–4.0)
LYMPHS PCT: 31 %
MCH: 28.5 pg (ref 26.0–34.0)
MCHC: 33 g/dL (ref 30.0–36.0)
MCV: 86.4 fL (ref 78.0–100.0)
MONOS PCT: 8 %
Monocytes Absolute: 0.4 10*3/uL (ref 0.1–1.0)
NEUTROS ABS: 3.1 10*3/uL (ref 1.7–7.7)
NEUTROS PCT: 58 %
Platelets: 222 10*3/uL (ref 150–400)
RBC: 4.18 MIL/uL — ABNORMAL LOW (ref 4.22–5.81)
RDW: 13.6 % (ref 11.5–15.5)
WBC: 5.4 10*3/uL (ref 4.0–10.5)

## 2018-03-08 LAB — MAGNESIUM: Magnesium: 1.6 mg/dL — ABNORMAL LOW (ref 1.7–2.4)

## 2018-03-08 LAB — BRAIN NATRIURETIC PEPTIDE: B Natriuretic Peptide: 100.9 pg/mL — ABNORMAL HIGH (ref 0.0–100.0)

## 2018-03-08 LAB — PROTIME-INR
INR: 1.1
PROTHROMBIN TIME: 14.1 s (ref 11.4–15.2)

## 2018-03-08 LAB — TROPONIN I
Troponin I: <0.03 ng/mL (ref <0.03)
Troponin I: <0.03 ng/mL (ref <0.03)

## 2018-03-08 LAB — GLUCOSE, CAPILLARY: Glucose-Capillary: 287 mg/dL — ABNORMAL HIGH (ref 65–99)

## 2018-03-08 LAB — CBG MONITORING, ED
GLUCOSE-CAPILLARY: 55 mg/dL — AB (ref 65–99)
GLUCOSE-CAPILLARY: 168 mg/dL — AB (ref 65–99)

## 2018-03-08 MED ORDER — ASPIRIN EC 81 MG PO TBEC
81.0000 mg | DELAYED_RELEASE_TABLET | Freq: Every day | ORAL | Status: DC
  Administered 2018-03-10 – 2018-03-11 (×2): 81 mg via ORAL
  Filled 2018-03-08 (×2): qty 1

## 2018-03-08 MED ORDER — PANTOPRAZOLE SODIUM 40 MG PO TBEC
40.0000 mg | DELAYED_RELEASE_TABLET | Freq: Every day | ORAL | Status: DC
  Administered 2018-03-08 – 2018-03-11 (×3): 40 mg via ORAL
  Filled 2018-03-08 (×3): qty 1

## 2018-03-08 MED ORDER — PRIMIDONE 50 MG PO TABS
25.0000 mg | ORAL_TABLET | Freq: Two times a day (BID) | ORAL | Status: DC
  Administered 2018-03-08 – 2018-03-11 (×5): 25 mg via ORAL
  Filled 2018-03-08 (×7): qty 0.5

## 2018-03-08 MED ORDER — ALBUTEROL SULFATE (2.5 MG/3ML) 0.083% IN NEBU: 2.5000 mg | INHALATION_SOLUTION | Freq: Four times a day (QID) | RESPIRATORY_TRACT | Status: DC | PRN

## 2018-03-08 MED ORDER — ALBUTEROL SULFATE HFA 108 (90 BASE) MCG/ACT IN AERS: 2.0000 | INHALATION_SPRAY | Freq: Four times a day (QID) | RESPIRATORY_TRACT | Status: DC | PRN

## 2018-03-08 MED ORDER — NITROGLYCERIN 0.4 MG SL SUBL
0.4000 mg | SUBLINGUAL_TABLET | SUBLINGUAL | Status: DC | PRN
  Administered 2018-03-08 – 2018-03-11 (×3): 0.4 mg via SUBLINGUAL
  Filled 2018-03-08 (×3): qty 1

## 2018-03-08 MED ORDER — SODIUM CHLORIDE 0.9% FLUSH
3.0000 mL | Freq: Two times a day (BID) | INTRAVENOUS | Status: DC
  Administered 2018-03-08 – 2018-03-11 (×5): 3 mL via INTRAVENOUS

## 2018-03-08 MED ORDER — ACETAMINOPHEN 500 MG PO TABS: 1000.0000 mg | ORAL_TABLET | Freq: Four times a day (QID) | ORAL | Status: DC | PRN

## 2018-03-08 MED ORDER — MAGNESIUM SULFATE 2 GM/50ML IV SOLN
2.0000 g | Freq: Once | INTRAVENOUS | Status: AC
Start: 2018-03-08 — End: 2018-03-08
  Administered 2018-03-08: 2 g via INTRAVENOUS
  Filled 2018-03-08: qty 50

## 2018-03-08 MED ORDER — ASPIRIN 81 MG PO CHEW
324.0000 mg | CHEWABLE_TABLET | Freq: Once | ORAL | Status: AC
  Administered 2018-03-08: 324 mg via ORAL
  Filled 2018-03-08: qty 4

## 2018-03-08 MED ORDER — AMLODIPINE BESYLATE 10 MG PO TABS
10.0000 mg | ORAL_TABLET | Freq: Every day | ORAL | Status: DC
Start: 2018-03-09 — End: 2018-03-11
  Administered 2018-03-10 – 2018-03-11 (×2): 10 mg via ORAL
  Filled 2018-03-08 (×2): qty 1

## 2018-03-08 MED ORDER — ONDANSETRON HCL 4 MG/2ML IJ SOLN: 4.0000 mg | Freq: Four times a day (QID) | INTRAMUSCULAR | Status: DC | PRN

## 2018-03-08 MED ORDER — SODIUM CHLORIDE 0.9% FLUSH: 3.0000 mL | INTRAVENOUS | Status: DC | PRN

## 2018-03-08 MED ORDER — MORPHINE SULFATE (PF) 4 MG/ML IV SOLN
4.0000 mg | Freq: Once | INTRAVENOUS | Status: AC
Start: 2018-03-08 — End: 2018-03-08
  Administered 2018-03-08: 4 mg via INTRAVENOUS
  Filled 2018-03-08: qty 1

## 2018-03-08 MED ORDER — PROPRANOLOL HCL 40 MG PO TABS
40.0000 mg | ORAL_TABLET | Freq: Three times a day (TID) | ORAL | Status: DC
  Administered 2018-03-08 – 2018-03-11 (×7): 40 mg via ORAL
  Filled 2018-03-08 (×11): qty 1

## 2018-03-08 MED ORDER — FUROSEMIDE 10 MG/ML IJ SOLN
120.0000 mg | Freq: Two times a day (BID) | INTRAVENOUS | Status: DC
  Filled 2018-03-08 (×2): qty 12

## 2018-03-08 MED ORDER — LISINOPRIL 20 MG PO TABS
20.0000 mg | ORAL_TABLET | Freq: Every day | ORAL | Status: DC
  Administered 2018-03-10 – 2018-03-11 (×2): 20 mg via ORAL
  Filled 2018-03-08 (×2): qty 1

## 2018-03-08 MED ORDER — ENOXAPARIN SODIUM 40 MG/0.4ML ~~LOC~~ SOLN
40.0000 mg | SUBCUTANEOUS | Status: DC
  Administered 2018-03-08 – 2018-03-10 (×3): 40 mg via SUBCUTANEOUS
  Filled 2018-03-08 (×3): qty 0.4

## 2018-03-08 MED ORDER — FUROSEMIDE 10 MG/ML IJ SOLN
120.0000 mg | Freq: Once | INTRAVENOUS | Status: AC
  Administered 2018-03-08: 120 mg via INTRAVENOUS
  Filled 2018-03-08: qty 10

## 2018-03-08 MED ORDER — INSULIN ASPART 100 UNIT/ML ~~LOC~~ SOLN
0.0000 [IU] | Freq: Three times a day (TID) | SUBCUTANEOUS | Status: DC
  Administered 2018-03-09 (×2): 5 [IU] via SUBCUTANEOUS
  Administered 2018-03-09 – 2018-03-10 (×2): 8 [IU] via SUBCUTANEOUS
  Administered 2018-03-10: 11 [IU] via SUBCUTANEOUS
  Administered 2018-03-10 – 2018-03-11 (×2): 8 [IU] via SUBCUTANEOUS
  Administered 2018-03-11: 11 [IU] via SUBCUTANEOUS

## 2018-03-08 MED ORDER — CLOPIDOGREL BISULFATE 75 MG PO TABS
75.0000 mg | ORAL_TABLET | Freq: Every day | ORAL | Status: DC
  Administered 2018-03-08 – 2018-03-11 (×3): 75 mg via ORAL
  Filled 2018-03-08 (×3): qty 1

## 2018-03-08 MED ORDER — CLONAZEPAM 0.5 MG PO TABS
1.0000 mg | ORAL_TABLET | Freq: Every evening | ORAL | Status: DC | PRN
  Administered 2018-03-08 – 2018-03-10 (×2): 1 mg via ORAL
  Filled 2018-03-08 (×2): qty 2

## 2018-03-08 MED ORDER — INSULIN ASPART 100 UNIT/ML ~~LOC~~ SOLN
0.0000 [IU] | Freq: Every day | SUBCUTANEOUS | Status: DC
  Administered 2018-03-08 – 2018-03-09 (×2): 3 [IU] via SUBCUTANEOUS
  Administered 2018-03-10: 4 [IU] via SUBCUTANEOUS

## 2018-03-08 MED ORDER — ACETAMINOPHEN 325 MG PO TABS
650.0000 mg | ORAL_TABLET | ORAL | Status: DC | PRN
  Administered 2018-03-09: 650 mg via ORAL
  Filled 2018-03-08: qty 2

## 2018-03-08 MED ORDER — GABAPENTIN 300 MG PO CAPS
300.0000 mg | ORAL_CAPSULE | Freq: Two times a day (BID) | ORAL | Status: DC
  Administered 2018-03-08 – 2018-03-11 (×5): 300 mg via ORAL
  Filled 2018-03-08 (×5): qty 1

## 2018-03-08 MED ORDER — SODIUM CHLORIDE 0.9 % IV SOLN: 250.0000 mL | INTRAVENOUS | Status: DC | PRN

## 2018-03-08 MED ORDER — ROSUVASTATIN CALCIUM 20 MG PO TABS
20.0000 mg | ORAL_TABLET | Freq: Every day | ORAL | Status: DC
  Administered 2018-03-10 – 2018-03-11 (×2): 20 mg via ORAL
  Filled 2018-03-08 (×2): qty 1

## 2018-03-08 MED ORDER — MECLIZINE HCL 25 MG PO TABS
25.0000 mg | ORAL_TABLET | Freq: Two times a day (BID) | ORAL | Status: DC | PRN
  Filled 2018-03-08: qty 1

## 2018-03-08 MED ORDER — ISOSORBIDE MONONITRATE ER 30 MG PO TB24
15.0000 mg | ORAL_TABLET | Freq: Every day | ORAL | Status: DC
  Administered 2018-03-08 – 2018-03-11 (×3): 15 mg via ORAL
  Filled 2018-03-08 (×3): qty 1

## 2018-03-08 NOTE — ED Notes (Signed)
Pt given a Coke, crackers, and peanut butter, per Lexine Baton, Therapist, sports.

## 2018-03-08 NOTE — ED Notes (Signed)
Patient transported to X-ray 

## 2018-03-08 NOTE — ED Triage Notes (Signed)
EMS called for chest pain since Last night states pt started having chest pain and thought it was indigestion, did not go away with rest. Pt took 2 nitro at home, no relief, Fire gave 324 ASA. EMS gave 3 Nitro. pain went from 10/10 to 7/10. Describes pain as "tightness" and radiates to left shoulder. SOB when taking deep breath. EKG normal. 20 L hand in place. CBG 135. 104/60, 66 HR, 98% room air.

## 2018-03-08 NOTE — H&P (Addendum)
Cardiology Admission History and Physical:   Patient ID: Edward [name]; MRN: [id number]; DOB: 10-28-1941   Admission date: 03/08/2018  Primary Care Provider: Ann Held, DO Primary Cardiologist: Edward Grooms, MD  Primary Electrophysiologist:  None  Chief Complaint:  Chest pain  Patient Profile:   Edward [name] is a 77 y.o. male with a history of CAD s/p CABG 04/8294, chronic diastolic CHF, HTN, HLD, DM type 2, GERD, CKD stage II-III, OSA, and morbid obesity who presents with complaints of CP.  History of Present Illness:   Edward [name] was in his usual state of health until yesterday evening when he began experiencing chest pain which he initially thought was indigestion. He described the pain as a "tightness" that radiated to his left shoulder with associated SOB and diaphoresis. He states this pain was not as intense as the pain he presented with prior to his CABG. The pain persisted despite rest and a trial of maalox, so he took 2 SL nitro with no relief of symptoms prompting him to activate EMS. He was given a 3rd SL nitro and ASA 324mg  en route to the ED and his pain decreased from a 10/10 to a 7/10.   He was last evaluated by Edward [name] 01/29/2018 and was thought to be doing okay from a cardiac standpoint. His weight was 279lbs at this visit. He reported some DOE for which he would occasionally take an extra 40mg  lasix and Edward [name] attributed this to chronic diastolic CHF. He was without orthopnea, PND, significant LE swelling, or chest pain complaints at that time. He had no recent nitroglycerin use. His LDL was elevated to 120 and his statin was changed to crestor 20mg  daily. He underwent CABG x2 for LMCA and ostial circumflex disease 10/2016. He has had no ischemic evaluations since that time. Last echo 05/2017 with EF 60-65%, G1DD, and no wall motion abnormalities.   He is currently still experiencing mild CP. He notes chronic SOB and LE edema but hasn't appreciated any  significant worsening of symptoms in recent weeks. He reports a 5 lb weight gain in the last several months but does not weight himself daily. He reports taking an extra 40mg  lasix every other day, in addition to his 40mg  BID. He reports dietary indiscretion, frequently eating TV dinners, sandwiches, and canned vegetables. He reports orthopnea but denies PND   ED course: bradycardic (low 43bpm) and labile BP, otherwise VSS. Labs notable for K 4.1, Mg 1.6, Cr 1.09, Hgb 11.9, PLT 222, BNP 100.9, Trop <0.03, LDL 128. CXR with no acute findings. EKG sinus rhythm with 1st degree AV block, no STE/D, low voltage, no significant change from previous. He was given IV lasix 120mg  in the ED. Cardiology admitting for observation to rule out MI.     Past Medical History:  Diagnosis Date  . Adenomatous colon polyp 04/2011  . CAD (coronary artery disease)    a. 01/2015 DES to LAD  b. 12/2015: [place] 85% oLCx lesion--> rx therapy.  . Chronic diastolic CHF (congestive heart failure) ([place])    a. 05/2017 Echo: EF 60-65%, Gr1 DD, no rwma, mildly dil LA, nl RV fxn, mild TR.  . CKD (chronic kidney disease), stage II    a. probable CKD II-III with baseline CR 1.1-1.3.  . DM type 2 (diabetes mellitus, type 2), insulin dependent 01/29/2014   fasting cbg 50-120 with new regimen  . Dyslipidemia, goal LDL below 70 01/29/2014  . Enlarged prostate   .  Essential tremor    a. on proprnolol  . GERD (gastroesophageal reflux disease)   . Hypertension   . Internal hemorrhoid   . Sleep apnea    does not use cpap (06/04/2015)  . TIA (transient ischemic attack) 2002    Past Surgical History:  Procedure Laterality Date  . BACK SURGERY    . CARDIAC CATHETERIZATION  01/28/14   + CAD treat medically  . CARDIAC CATHETERIZATION N/A 12/21/2015   Procedure: Right/Left Heart Cath and Coronary Angiography;  Surgeon: Edward Crome, MD;  Location: [place] [facility];  Service: Cardiovascular;  Laterality: N/A;  . CARDIAC CATHETERIZATION N/A  11/02/2016   Procedure: Left Heart Cath and Coronary Angiography;  Surgeon: Edward Bush, MD;  Location: [place] [facility];  Service: Cardiovascular;  Laterality: N/A;  . CATARACT EXTRACTION Bilateral   . CATARACT EXTRACTION, BILATERAL Bilateral   . CORONARY ANGIOPLASTY WITH STENT PLACEMENT  01/30/2015   DES [place]  [place] to LAD by Dr Edward [name]  . CORONARY ARTERY BYPASS GRAFT N/A 11/09/2016   Procedure: CORONARY ARTERY BYPASS GRAFTING (CABG) x 2;  Surgeon: Edward Nakayama, MD;  Location: [place];  Service: Open Heart Surgery;  Laterality: N/A;  . ENDOVEIN HARVEST OF GREATER SAPHENOUS VEIN Left 11/09/2016   Procedure: ENDOVEIN HARVEST OF GREATER SAPHENOUS VEIN;  Surgeon: Edward Nakayama, MD;  Location: [place];  Service: Open Heart Surgery;  Laterality: Left;  . JOINT REPLACEMENT    . LEFT HEART CATHETERIZATION WITH CORONARY ANGIOGRAM N/A 01/28/2014   Procedure: LEFT HEART CATHETERIZATION WITH CORONARY ANGIOGRAM;  Surgeon: Edward Grooms, MD;  Location: [facility] CATH LAB;  Service: Cardiovascular;  Laterality: N/A;  . LEFT HEART CATHETERIZATION WITH CORONARY ANGIOGRAM N/A 01/30/2015   Procedure: LEFT HEART CATHETERIZATION WITH CORONARY ANGIOGRAM;  Surgeon: Edward Crome, MD;  Location: [facility] CATH LAB;  Service: Cardiovascular;  Laterality: N/A;  . LUMBAR [place]    . SHOULDER ARTHROSCOPY Right 07/30/2014   Procedure: Right Shoulder Arthroscopy, Debridement, Decompression, Manipulation Under Anesthesia;  Surgeon: Edward Minion, MD;  Location: [place];  Service: Orthopedics;  Laterality: Right;  . TEE WITHOUT CARDIOVERSION N/A 11/09/2016   Procedure: TRANSESOPHAGEAL ECHOCARDIOGRAM (TEE);  Surgeon: Edward Nakayama, MD;  Location: [place];  Service: Open Heart Surgery;  Laterality: N/A;  . TOTAL KNEE ARTHROPLASTY Bilateral      Medications Prior to Admission: Prior to Admission medications   Medication Sig Start Date End Date Taking? Authorizing Provider  acetaminophen (TYLENOL) 500 MG tablet  Take 2 tablets (1,000 mg total) by mouth every 6 (six) hours as needed. 12/07/16  Yes Edward Grosser, MD  albuterol (VENTOLIN HFA) 108 (90 Base) MCG/ACT inhaler inhale 2 puffs by mouth every 6 hours if needed for wheezing or shortness of breath 07/24/17  Yes Lowne Lyndal Pulley R, DO  amLODipine (NORVASC) 10 MG tablet Take 10 mg by mouth daily.   Yes [provider]  aspirin EC 81 MG tablet Take 81 mg by mouth daily.   Yes [provider]  bismuth subsalicylate (PEPTO BISMOL) 262 MG/15ML suspension Take 30 mLs by mouth as needed for indigestion.   Yes [provider]  Ciclopirox 1 % shampoo Apply 1 application topically See admin instructions. Massage in scalp three times weekly and leave on for 10 min. Then rinse.   Yes [provider]  clonazePAM (KLONOPIN) 1 MG tablet Take by mouth at bedtime as needed for anxiety.   Yes [provider]  clopidogrel (PLAVIX) 75 MG  tablet Take 1 tablet (75 mg total) by mouth daily. 11/07/17  Yes Burtis Junes, NP  diclofenac sodium (VOLTAREN) 1 % GEL Apply 2 g topically 4 (four) times daily as needed (for pain).    Yes [provider]  furosemide (LASIX) 80 MG tablet Take 1 tablet (80 mg total) by mouth daily. 11/07/17  Yes Burtis Junes, NP  gabapentin (NEURONTIN) 300 MG capsule take 1 capsule by mouth at bedtime for 1 week then INCREASE TO 1 CAP TWICE DAILY 08/22/17  Yes Patel, Donika K, DO  insulin NPH Human (HUMULIN N) 100 UNIT/ML injection Inject under skin 30 units 2x a day - ReliOn Patient taking differently: Inject 40 Units into the skin 2 (two) times daily before a meal. Inject under skin 30 units 2x a day - ReliOn 11/06/17  Yes Philemon Kingdom, MD  Insulin Regular Human (HUMULIN R U-500, CONCENTRATED, Optima) Inject 20 Units into the skin 3 (three) times daily with meals.   Yes [provider]  isosorbide mononitrate (IMDUR) 30 MG 24 hr tablet Take 15 mg by mouth daily. 07/17/17  Yes [provider]  lisinopril (PRINIVIL,ZESTRIL) 20 MG tablet Take 20 mg by mouth daily.   Yes [provider]  meclizine (ANTIVERT) 25 MG tablet Take 1 tablet (25 mg total) by mouth 2 (two) times daily as needed for dizziness. 06/10/17  Yes Theora Gianotti, NP  nitroGLYCERIN (NITROSTAT) 0.4 MG SL tablet Place 1 tablet (0.4 mg total) under the tongue every 5 (five) minutes as needed for chest pain. Max: 3 doses per episode. 11/07/17  Yes Burtis Junes, NP  pantoprazole (PROTONIX) 40 MG tablet Take 40 mg by mouth daily.   Yes [provider]  Polyvinyl Alcohol-Povidone (REFRESH OP) Place 1 drop into both eyes 3 (three) times daily as needed (for dry eyes). Reported on 12/25/2015   Yes [provider]  potassium chloride SA (K-DUR,KLOR-CON) 20 MEQ tablet Take 1.5 tablets (30 mEq total) by mouth daily. 06/10/17  Yes Theora Gianotti, NP  primidone (MYSOLINE) 50 MG tablet Take 25 mg by mouth 2 (two) times daily.   Yes [provider]  propranolol (INDERAL) 40 MG tablet Take 1 tablet (40 mg total) by mouth 3 (three) times daily. 01/19/18  Yes Patel, Donika K, DO  psyllium (METAMUCIL) 58.6 % packet Take 1 packet by mouth daily as needed (constipation).   Yes [provider]  rosuvastatin (CRESTOR) 20 MG tablet Take 1 tablet (20 mg total) by mouth daily. 01/29/18 01/24/19 Yes Edward Crome, MD  clonazePAM (KLONOPIN) 0.5 MG tablet Take 1 tablet (0.5 mg total) by mouth 2 (two) times daily as needed (tremors). Patient not taking: Reported on 03/08/2018 07/24/17   Carollee Herter, Kendrick Fries R, DO  fluticasone furoate-vilanterol (BREO ELLIPTA) 100-25 MCG/INH AEPB Inhale 1 puff into the lungs daily. Patient not taking: Reported on 03/08/2018 07/24/17   Edward Held, DO  HYDROcodone-acetaminophen (NORCO/VICODIN) 5-325 MG tablet Take 1 tablet by mouth every 4 (four) hours as needed. Patient not taking: Reported on 03/08/2018 10/13/17   Tegeler, Gwenyth Allegra, MD    Insulin Syringe-Needle U-100 (BD INSULIN SYRINGE U/F) 31G X 5/16" 1 ML MISC Use 4 times per day to inject insulin. 01/16/18   Philemon Kingdom, MD  traMADol (ULTRAM) 50 MG tablet Take 1 tablet (50 mg total) by mouth every 6 (six) hours as needed. Patient not taking: Reported on 03/08/2018 10/02/17   Dorie Rank, MD     Allergies:  Allergies  Allergen Reactions  . Pneumococcal Vaccines Anaphylaxis    Social History:   Social History   Socioeconomic History  . Marital status: Married    Spouse name: Not on file  . Number of children: 0  . Years of education: Not on file  . Highest education level: Not on file  Occupational History  . Occupation: retired  Scientific laboratory technician  . Financial resource strain: Not on file  . Food insecurity:    Worry: Not on file    Inability: Not on file  . Transportation needs:    Medical: Not on file    Non-medical: Not on file  Tobacco Use  . Smoking status: Never Smoker  . Smokeless tobacco: Never Used  Substance and Sexual Activity  . Alcohol use: No    Alcohol/week: 0.0 oz  . Drug use: No  . Sexual activity: Never  Lifestyle  . Physical activity:    Days per week: Not on file    Minutes per session: Not on file  . Stress: Not on file  Relationships  . Social connections:    Talks on phone: Not on file    Gets together: Not on file    Attends religious service: Not on file    Active member of club or organization: Not on file    Attends meetings of clubs or organizations: Not on file    Relationship status: Not on file  . Intimate partner violence:    Fear of current or ex partner: Not on file    Emotionally abused: Not on file    Physically abused: Not on file    Forced sexual activity: Not on file  Other Topics Concern  . Not on file  Social History Narrative   He is a Geophysicist/field seismologist by trade.   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   He lives alone.  His wife is living in [place] at this time.  They do not  have children.   Highest level of education:  College graduate    Family History:   The patient's family history includes Alzheimer's disease in his sister; Asthma in his brother; CAD in his brother and sister; Diabetes in his other; Prostate cancer in his brother. There is no history of Colon cancer.    ROS:  Please see the history of present illness.  All other ROS reviewed and negative.     Physical Exam/Data:   Vitals:   03/08/18 1430 03/08/18 1500 03/08/18 1515 03/08/18 1530  BP: (!) 149/67 124/61 133/66 120/62  Pulse: 61 (!) 55 (!) 56 (!) 56  Resp: 16 17 18 16   SpO2: 99% 100% 99% 98%  Weight:       No intake or output data in the 24 hours ending 03/08/18 1559 Filed Weights   03/08/18 1203  Weight: 279 lb (126.6 kg)   Body mass index is 42.42 kg/m.  General:  Morbidly obese gentleman laying in bed in no acute distress HEENT: sclera anicteric, MMM Neck: no JVD Vascular: No carotid bruits;distal pulses 2+ bilaterally  Cardiac:  normal S1, S2; RRR; no obvious murmurs, gallops, or rubs Lungs:  clear to auscultation bilaterally, faint crackles at bases b/l; no wheezing or rhonchi Abd: soft, obese nontender, no hepatomegaly  Ext: 2+ LE edema Musculoskeletal:  No deformities, BUE and BLE strength normal and equal Skin: warm and dry; seborrheic keratosis noted Neuro:  CNs 2-12 intact, no focal abnormalities noted Psych:  Normal affect  EKG:  sinus rhythm with 1st degree AV block, no STE/D, no significant change from previous.  Relevant CV Studies:  Echocardiogram 05/2017: Study Conclusions  - Left ventricle: The cavity size was normal. There was severe   concentric hypertrophy. Systolic function was normal. The   estimated ejection fraction was in the range of 60% to 65%. Wall   motion was normal; there were no regional wall motion   abnormalities. Doppler parameters are consistent with abnormal   left ventricular relaxation (grade 1 diastolic dysfunction).    There was no evidence of elevated ventricular filling pressure by   Doppler parameters. - Aortic valve: Trileaflet; normal thickness leaflets. There was no   regurgitation. - Aortic root: The aortic root was normal in size. - Mitral valve: There was no regurgitation. - Left atrium: The atrium was mildly dilated. - Right ventricle: Systolic function was normal. - Tricuspid valve: There was mild regurgitation. - Pulmonary arteries: Systolic pressure was within the normal   range. - Inferior vena cava: The vessel was not visualized. - Pericardium, extracardiac: There was no pericardial effusion.  Laboratory Data:  Chemistry Recent Labs  Lab 03/07/18 0953 03/08/18 1228  NA 140 139  K 4.1 4.1  CL 100 104  CO2 27 28  GLUCOSE 148* 94  BUN 11 9  CREATININE 1.09 1.09  CALCIUM 9.0 8.6*  GFRNONAA 66 >60  GFRAA 76 >60  ANIONGAP  --  7    Recent Labs  Lab 03/07/18 0953 03/08/18 1228  PROT 6.4 5.8*  ALBUMIN 3.7 3.0*  AST 18 19  ALT 17 17  ALKPHOS 100 79  BILITOT 0.5 0.5   Hematology Recent Labs  Lab 03/08/18 1228  WBC 5.4  RBC 4.18*  HGB 11.9*  HCT 36.1*  MCV 86.4  MCH 28.5  MCHC 33.0  RDW 13.6  PLT 222   Cardiac Enzymes Recent Labs  Lab 03/08/18 1228  TROPONINI <0.03   No results for input(s): TROPIPOC in the last 168 hours.  BNP Recent Labs  Lab 03/08/18 1228  BNP 100.9*    DDimer No results for input(s): DDIMER in the last 168 hours.  Radiology/Studies:  Dg Chest 2 View  Result Date: 03/08/2018 CLINICAL DATA:  Chest pain. EXAM: CHEST - 2 VIEW COMPARISON:  Radiographs of October 13, 2017. FINDINGS: Stable cardiomediastinal silhouette. Status post coronary artery bypass graft. No pneumothorax or pleural effusion is noted. Both lungs are clear. The visualized skeletal structures are unremarkable. IMPRESSION: No active cardiopulmonary disease. Electronically Signed   By: Marijo Conception, M.D.   On: 03/08/2018 13:21    Assessment and Plan:   1. Chest  pain in patient with CAD s/p CABG 10/2016: reports sudden onset chest pain overnight not relieved with rest or SL nitro. Had some associated SOB. Trop negative x1. EKG non-ischemic.  - Trend troponin x3 or to peak - If troponin becomes elevated, would start heparin gtt - Further ischemic work pending troponin trend - Continue ASA, plavix, statin, prn nitro, and imdur  2. Acute on chronic diastolic CHF: He reports chronic DOE and reports a 5lb weight gain in the past month. BNP 100.9. CXR without acute findings. Possible he has some volume overload contributing to chest pain. Lungs with faint crackles in lung bases and 2+ LE edema noted on exam. Patient received IV lasix 120mg  x1 in the ED.  - Will continue IV lasix 120mg  BID for now - Monitor strict I&Os and daily weights - Monitor electrolytes closely and replete for  K >4, Mg >2 - will replete Mg with 2g IV now - Supportive care with compression stockings and LE elevation  3. HTN: BP soft on presentation likely due to SL nitro administer en route to ED; otherwise stable. - Continue home amlodipine, lisinopril, propranolol, and imdur  4. HLD: LDL 128; recent medication adjustment 01/2018 - Continue crestor   5. DM type 2: poorly controlled; Last A1C 9.8 10/2017 - ISS while inpatient - Will need to follow-up with PCP after discharge for further assistance with glycemic control - Continue gabapentin for diabetic neuropathy  6. Essential tremor: chronic/stable - Continue propranolol and primidone   Severity of Illness: The appropriate patient status for this patient is OBSERVATION. Observation status is judged to be reasonable and necessary in order to provide the required intensity of service to ensure the patient's safety. The patient's presenting symptoms, physical exam findings, and initial radiographic and laboratory data in the context of their medical condition is felt to place them at decreased risk for further clinical deterioration.  Furthermore, it is anticipated that the patient will be medically stable for discharge from the hospital within 2 midnights of admission. The following factors support the patient status of observation.   " The patient's presenting symptoms include chest pain and SOB. " The physical exam findings include faint crackles at lung bases and LE edema. " The initial radiographic and laboratory data are BNP 100. EKG non-ischemic.     For questions or updates, please contact [place] Please consult [email or web address] for contact info under Cardiology/STEMI.    Signed, Abigail Butts, PA-C  03/08/2018 3:59 PM   I have seen and examined the patient along with Abigail Butts, PA-C .  I have reviewed the chart, notes and new data.  I agree with PA's note.  Key new complaints: Chest discomfort is similar in location and quality to previous angina, but did not respond as expected to nitroglycerin.  He has had chest pain for well over 10 hours before his troponin levels were drawn and found to be normal.  He has bendopnea, orthopnea and lower extremity edema.  He does very poorly with sodium restriction.  He eats TV dinners most days when at home.  If he eats out he goes to [employer]. Key examination changes: The veins are hard to see but seem to be elevated 8 cm above the sternal angle.  Bilateral lower extremity edema 2+ and pitting.  I do not hear a gallop or significant murmur.  Diminished breath sounds in both bases and a few rales. Key new findings / data: BNP is remarkably low at only 101.  However, previous BNP levels have been even lower, with a baseline of around 27.  Normal troponin level.  Low risk ECG without ischemic changes.  Evidence of pulmonary infiltrates on chest x-ray.  PLAN: I am not sure what is causing Mr. Vardaman's chest discomfort, but suspect that it may be related to volume overload and heart failure exacerbation.  We will continue diuresing and see if his symptoms improve  overnight.  Recheck cardiac enzyme levels.  He is already on triple antianginal therapy with beta-blocker, amlodipine, long-acting nitrate.  He is very worried about his recurrent symptoms and he thinks he should have another cardiac catheterization.  Discussed the fact that this is not a risk-free procedure and that it is not common to have serious new coronary problems and so soon after bypass surgery.  Nevertheless, if his chest discomfort does not improve with  treatment for heart failure, I do not have an alternative explanation for it.  Coronary angiography may prove to be necessary.  Sanda Klein, MD, [place] [phone] 03/08/2018, 6:51 PM

## 2018-03-08 NOTE — ED Provider Notes (Signed)
[facility] EMERGENCY DEPARTMENT Provider Note   CSN: [id number] Arrival date & time: 03/08/18  1157     History   Chief Complaint Chief Complaint  Patient presents with  . Chest Pain    HPI Edward [name] is a 77 y.o. male.  HPI Patient presents with concern of chest pain. Patient has multiple medical issues including CAD, CHF, CKD. He notes that he has had dyspnea, swelling for some time, but over the past 24 hours has had sternal chest discomfort. Pain is nonradiating, sharp, improved with nitroglycerin, but still mildly present. No associated lightheadedness, no syncope. No fever, no chills, no cough. Patient has approximately 5 pound weight gain over the past 2 months, though he notes he is taking 80 mg of Lasix daily. Prior to yesterday, patient did have baseline dyspnea, but no chest pain. Past Medical History:  Diagnosis Date  . Adenomatous colon polyp 04/2011  . CAD (coronary artery disease)    a. 01/2015 DES to LAD  b. 12/2015: [place] 85% oLCx lesion--> rx therapy.  . Chronic diastolic CHF (congestive heart failure) ([place])    a. 05/2017 Echo: EF 60-65%, Gr1 DD, no rwma, mildly dil LA, nl RV fxn, mild TR.  . CKD (chronic kidney disease), stage II    a. probable CKD II-III with baseline CR 1.1-1.3.  . DM type 2 (diabetes mellitus, type 2), insulin dependent 01/29/2014   fasting cbg 50-120 with new regimen  . Dyslipidemia, goal LDL below 70 01/29/2014  . Enlarged prostate   . Essential tremor    a. on proprnolol  . GERD (gastroesophageal reflux disease)   . Hypertension   . Internal hemorrhoid   . Sleep apnea    does not use cpap (06/04/2015)  . TIA (transient ischemic attack) 2002    Patient Active Problem List   Diagnosis Date Noted  . Anxiety 07/24/2017  . Atelectasis   . Coronary artery disease involving coronary bypass graft of native heart with angina pectoris ([place])   . Hypertensive heart disease without heart failure   . Chronic kidney  disease, stage III (moderate) ([place]) 11/03/2016  . Chronic diastolic heart failure ([place])   . Near syncope 04/19/2016  . Diabetes mellitus ([place]) 03/11/2016  . GERD (gastroesophageal reflux disease) 08/27/2015  . Acute bronchitis 08/27/2015  . SOB (shortness of breath) 07/08/2015  . Obstructive sleep apnea 06/22/2015  . Bronchospasm 06/22/2015  . Essential tremor 04/23/2015  . CAP (community acquired pneumonia) 04/16/2014  . Essential hypertension 01/29/2014    Past Surgical History:  Procedure Laterality Date  . BACK SURGERY    . CARDIAC CATHETERIZATION  01/28/14   + CAD treat medically  . CARDIAC CATHETERIZATION N/A 12/21/2015   Procedure: Right/Left Heart Cath and Coronary Angiography;  Surgeon: Belva Crome, MD;  Location: [facility] CV LAB;  Service: Cardiovascular;  Laterality: N/A;  . CARDIAC CATHETERIZATION N/A 11/02/2016   Procedure: Left Heart Cath and Coronary Angiography;  Surgeon: Nelva Bush, MD;  Location: [facility] CV LAB;  Service: Cardiovascular;  Laterality: N/A;  . CATARACT EXTRACTION Bilateral   . CATARACT EXTRACTION, BILATERAL Bilateral   . CORONARY ANGIOPLASTY WITH STENT PLACEMENT  01/30/2015   DES [place]  [place] to LAD by Dr Tamala Julian  . CORONARY ARTERY BYPASS GRAFT N/A 11/09/2016   Procedure: CORONARY ARTERY BYPASS GRAFTING (CABG) x 2;  Surgeon: Melrose Nakayama, MD;  Location: [facility];  Service: Open Heart Surgery;  Laterality: N/A;  . ENDOVEIN HARVEST OF GREATER SAPHENOUS VEIN  Left 11/09/2016   Procedure: ENDOVEIN HARVEST OF GREATER SAPHENOUS VEIN;  Surgeon: Melrose Nakayama, MD;  Location: [facility];  Service: Open Heart Surgery;  Laterality: Left;  . JOINT REPLACEMENT    . LEFT HEART CATHETERIZATION WITH CORONARY ANGIOGRAM N/A 01/28/2014   Procedure: LEFT HEART CATHETERIZATION WITH CORONARY ANGIOGRAM;  Surgeon: Sinclair Grooms, MD;  Location: [facility] CATH LAB;  Service: Cardiovascular;  Laterality: N/A;  . LEFT HEART CATHETERIZATION WITH CORONARY ANGIOGRAM N/A  01/30/2015   Procedure: LEFT HEART CATHETERIZATION WITH CORONARY ANGIOGRAM;  Surgeon: Belva Crome, MD;  Location: [facility] CATH LAB;  Service: Cardiovascular;  Laterality: N/A;  . LUMBAR [place]    . SHOULDER ARTHROSCOPY Right 07/30/2014   Procedure: Right Shoulder Arthroscopy, Debridement, Decompression, Manipulation Under Anesthesia;  Surgeon: Newt Minion, MD;  Location: [facility];  Service: Orthopedics;  Laterality: Right;  . TEE WITHOUT CARDIOVERSION N/A 11/09/2016   Procedure: TRANSESOPHAGEAL ECHOCARDIOGRAM (TEE);  Surgeon: Melrose Nakayama, MD;  Location: [facility];  Service: Open Heart Surgery;  Laterality: N/A;  . TOTAL KNEE ARTHROPLASTY Bilateral         Home Medications    Prior to Admission medications   Medication Sig Start Date End Date Taking? Authorizing Provider  acetaminophen (TYLENOL) 500 MG tablet Take 2 tablets (1,000 mg total) by mouth every 6 (six) hours as needed. 12/07/16  Yes Leo Grosser, MD  albuterol (VENTOLIN HFA) 108 (90 Base) MCG/ACT inhaler inhale 2 puffs by mouth every 6 hours if needed for wheezing or shortness of breath 07/24/17  Yes Lowne Lyndal Pulley R, DO  amLODipine (NORVASC) 10 MG tablet Take 10 mg by mouth daily.   Yes [provider]  aspirin EC 81 MG tablet Take 81 mg by mouth daily.   Yes [provider]  bismuth subsalicylate (PEPTO BISMOL) 262 MG/15ML suspension Take 30 mLs by mouth as needed for indigestion.   Yes [provider]  Ciclopirox 1 % shampoo Apply 1 application topically See admin instructions. Massage in scalp three times weekly and leave on for 10 min. Then rinse.   Yes [provider]  clonazePAM (KLONOPIN) 1 MG tablet Take by mouth at bedtime as needed for anxiety.   Yes [provider]  clopidogrel (PLAVIX) 75 MG tablet Take 1 tablet (75 mg total) by mouth daily. 11/07/17  Yes Burtis Junes, NP  diclofenac sodium (VOLTAREN) 1 % GEL Apply 2 g topically 4 (four) times daily as needed (for  pain).    Yes [provider]  furosemide (LASIX) 80 MG tablet Take 1 tablet (80 mg total) by mouth daily. 11/07/17  Yes Burtis Junes, NP  gabapentin (NEURONTIN) 300 MG capsule take 1 capsule by mouth at bedtime for 1 week then INCREASE TO 1 CAP TWICE DAILY 08/22/17  Yes Patel, Donika K, DO  insulin NPH Human (HUMULIN N) 100 UNIT/ML injection Inject under skin 30 units 2x a day - ReliOn Patient taking differently: Inject 40 Units into the skin 2 (two) times daily before a meal. Inject under skin 30 units 2x a day - ReliOn 11/06/17  Yes Philemon Kingdom, MD  Insulin Regular Human (HUMULIN R U-500, CONCENTRATED, [place]) Inject 20 Units into the skin 3 (three) times daily with meals.   Yes [provider]  isosorbide mononitrate (IMDUR) 30 MG 24 hr tablet Take 15 mg by mouth daily. 07/17/17  Yes [provider]  lisinopril (PRINIVIL,ZESTRIL) 20 MG tablet Take 20 mg by mouth daily.   Yes  [provider]  meclizine (ANTIVERT) 25 MG tablet Take 1 tablet (25 mg total) by mouth 2 (two) times daily as needed for dizziness. 06/10/17  Yes Theora Gianotti, NP  nitroGLYCERIN (NITROSTAT) 0.4 MG SL tablet Place 1 tablet (0.4 mg total) under the tongue every 5 (five) minutes as needed for chest pain. Max: 3 doses per episode. 11/07/17  Yes Burtis Junes, NP  pantoprazole (PROTONIX) 40 MG tablet Take 40 mg by mouth daily.   Yes [provider]  Polyvinyl Alcohol-Povidone (REFRESH OP) Place 1 drop into both eyes 3 (three) times daily as needed (for dry eyes). Reported on 12/25/2015   Yes [provider]  potassium chloride SA (K-DUR,KLOR-CON) 20 MEQ tablet Take 1.5 tablets (30 mEq total) by mouth daily. 06/10/17  Yes Theora Gianotti, NP  primidone (MYSOLINE) 50 MG tablet Take 25 mg by mouth 2 (two) times daily.   Yes [provider]  propranolol (INDERAL) 40 MG tablet Take 1 tablet (40 mg total) by mouth 3 (three) times daily. 01/19/18  Yes  Patel, Donika K, DO  psyllium (METAMUCIL) 58.6 % packet Take 1 packet by mouth daily as needed (constipation).   Yes [provider]  rosuvastatin (CRESTOR) 20 MG tablet Take 1 tablet (20 mg total) by mouth daily. 01/29/18 01/24/19 Yes Belva Crome, MD  clonazePAM (KLONOPIN) 0.5 MG tablet Take 1 tablet (0.5 mg total) by mouth 2 (two) times daily as needed (tremors). Patient not taking: Reported on 03/08/2018 07/24/17   Carollee Herter, Kendrick Fries R, DO  fluticasone furoate-vilanterol (BREO ELLIPTA) 100-25 MCG/INH AEPB Inhale 1 puff into the lungs daily. Patient not taking: Reported on 03/08/2018 07/24/17   Ann Held, DO  HYDROcodone-acetaminophen (NORCO/VICODIN) 5-325 MG tablet Take 1 tablet by mouth every 4 (four) hours as needed. Patient not taking: Reported on 03/08/2018 10/13/17   Tegeler, Gwenyth Allegra, MD  Insulin Syringe-Needle U-100 (BD INSULIN SYRINGE U/F) 31G X 5/16" 1 ML MISC Use 4 times per day to inject insulin. 01/16/18   Philemon Kingdom, MD  traMADol (ULTRAM) 50 MG tablet Take 1 tablet (50 mg total) by mouth every 6 (six) hours as needed. Patient not taking: Reported on 03/08/2018 10/02/17   Dorie Rank, MD    Family History Family History  Problem Relation Age of Onset  . CAD Brother        2 brothers - CABG  . Alzheimer's disease Sister   . CAD Sister   . Prostate cancer Brother   . Asthma Brother        2 brothers   . Diabetes Other        entire family  . Colon cancer Neg Hx     Social History Social History   Tobacco Use  . Smoking status: Never Smoker  . Smokeless tobacco: Never Used  Substance Use Topics  . Alcohol use: No    Alcohol/week: 0.0 oz  . Drug use: No     Allergies   Pneumococcal vaccines   Review of Systems Review of Systems  Constitutional:       Per HPI, otherwise negative  HENT:       Per HPI, otherwise negative  Respiratory:       Per HPI, otherwise negative  Cardiovascular:       Per HPI, otherwise negative    Gastrointestinal: Negative for vomiting.  Endocrine:       Negative aside from HPI  Genitourinary:       Neg aside  from HPI   Musculoskeletal:       Per HPI, otherwise negative  Skin: Negative.   Neurological: Negative for syncope.     Physical Exam Updated Vital Signs BP (!) 149/67   Pulse 61   Resp 16   Wt 126.6 kg (279 lb)   SpO2 99%   BMI 42.42 kg/m   Physical Exam  Constitutional: He is oriented to person, place, and time. He appears well-developed. No distress.  HENT:  Head: Normocephalic and atraumatic.  Eyes: Conjunctivae and EOM are normal.  Cardiovascular: Normal rate and regular rhythm.  Pulmonary/Chest: Effort normal. No stridor. No respiratory distress.  Abdominal: He exhibits no distension.  Musculoskeletal:       Right lower leg: He exhibits edema.  Symmetric bilateral lower extremity edema  Neurological: He is alert and oriented to person, place, and time.  Skin: Skin is warm and dry.  Psychiatric: He has a normal mood and affect.  Nursing note and vitals reviewed.    ED Treatments / Results  Labs (all labs ordered are listed, but only abnormal results are displayed) Labs Reviewed  COMPREHENSIVE METABOLIC PANEL - Abnormal; Notable for the following components:      Result Value   Calcium 8.6 (*)    Total Protein 5.8 (*)    Albumin 3.0 (*)    All other components within normal limits  MAGNESIUM - Abnormal; Notable for the following components:   Magnesium 1.6 (*)    All other components within normal limits  BRAIN NATRIURETIC PEPTIDE - Abnormal; Notable for the following components:   B Natriuretic Peptide 100.9 (*)    All other components within normal limits  CBC WITH DIFFERENTIAL/PLATELET - Abnormal; Notable for the following components:   RBC 4.18 (*)    Hemoglobin 11.9 (*)    HCT 36.1 (*)    All other components within normal limits  CBG MONITORING, ED - Abnormal; Notable for the following components:   Glucose-Capillary 55 (*)     All other components within normal limits  TROPONIN I  PROTIME-INR    EKG EKG Interpretation  Date/Time:  Thursday Mar 08 2018 12:05:07 EDT Ventricular Rate:  64 PR Interval:    QRS Duration: 98 QT Interval:  426 QTC Calculation: 440 R Axis:   71 Text Interpretation:  Sinus rhythm Prolonged PR interval Low voltage, precordial leads Probable anteroseptal infarct, old No significant change since last tracing Abnormal ekg Confirmed by Carmin Muskrat [phone]) on 03/08/2018 12:46:49 PM   Radiology Dg Chest 2 View  Result Date: 03/08/2018 CLINICAL DATA:  Chest pain. EXAM: CHEST - 2 VIEW COMPARISON:  Radiographs of October 13, 2017. FINDINGS: Stable cardiomediastinal silhouette. Status post coronary artery bypass graft. No pneumothorax or pleural effusion is noted. Both lungs are clear. The visualized skeletal structures are unremarkable. IMPRESSION: No active cardiopulmonary disease. Electronically Signed   By: Marijo Conception, M.D.   On: 03/08/2018 13:21    Procedures Procedures (including critical care time)  Medications Ordered in ED Medications  furosemide (LASIX) 120 mg in dextrose 5 % 50 mL IVPB (120 mg Intravenous New Bag/Given 03/08/18 1503)  aspirin chewable tablet 324 mg (324 mg Oral Given 03/08/18 1226)  morphine 4 MG/ML injection 4 mg (4 mg Intravenous Given 03/08/18 1348)     Initial Impression / Assessment and Plan / ED Course  I have reviewed the triage vital signs and the nursing notes.  Pertinent labs & imaging results that were available during my care of the patient  were reviewed by me and considered in my medical decision making (see chart for details).     3:06 PM Patient continues to complain of chest pain, though he has received aspirin, nitroglycerin, morphine. Initial findings reassuring, with nonischemic EKG, normal first troponin. Patient does have elevation in his BNP, and given his description of weight gain and swelling, will receive 150% of his typical  dose, 120 mg Lasix. Given the persistent pain, and his substantial risk profile for coronary disease, as well as history of CHF, other comorbidities, patient will be admitted for further evaluation and management.  Final Clinical Impressions(s) / ED Diagnoses  Atypical chest pain Heart failure exacerbation   Carmin Muskrat, MD 03/08/18 2316

## 2018-03-08 NOTE — ED Notes (Signed)
Pt CBG 55; EDP made aware. Pt given orange juice at this time.

## 2018-03-08 NOTE — ED Notes (Signed)
ED Provider at bedside. 

## 2018-03-08 NOTE — Plan of Care (Signed)
  Problem: Coping: Goal: Level of anxiety will decrease Outcome: Completed/Met   Problem: Pain Managment: Goal: General experience of comfort will improve Outcome: Completed/Met   Problem: Skin Integrity: Goal: Risk for impaired skin integrity will decrease Outcome: Completed/Met

## 2018-03-08 NOTE — ED Triage Notes (Signed)
Hx of CABG last year, and edema in lower legs. Pt on lasix

## 2018-03-09 ENCOUNTER — Inpatient Hospital Stay (HOSPITAL_COMMUNITY)
Admission: EM | Disposition: A | Discharge status: Home / Self Care | Payer: Self-pay | Source: Home / Self Care | Attending: Cardiovascular Disease

## 2018-03-09 ENCOUNTER — Encounter (HOSPITAL_COMMUNITY): Payer: Self-pay | Admitting: Interventional Cardiology

## 2018-03-09 DIAGNOSIS — I25709 Atherosclerosis of coronary artery bypass graft(s), unspecified, with unspecified angina pectoris: Secondary | ICD-10-CM | POA: Diagnosis not present

## 2018-03-09 DIAGNOSIS — I5033 Acute on chronic diastolic (congestive) heart failure: Secondary | ICD-10-CM | POA: Diagnosis not present

## 2018-03-09 DIAGNOSIS — N183 Chronic kidney disease, stage 3 (moderate): Secondary | ICD-10-CM | POA: Diagnosis not present

## 2018-03-09 DIAGNOSIS — Z951 Presence of aortocoronary bypass graft: Secondary | ICD-10-CM | POA: Diagnosis not present

## 2018-03-09 DIAGNOSIS — E1140 Type 2 diabetes mellitus with diabetic neuropathy, unspecified: Secondary | ICD-10-CM | POA: Diagnosis present

## 2018-03-09 DIAGNOSIS — I25110 Atherosclerotic heart disease of native coronary artery with unstable angina pectoris: Secondary | ICD-10-CM

## 2018-03-09 DIAGNOSIS — I10 Essential (primary) hypertension: Secondary | ICD-10-CM | POA: Diagnosis not present

## 2018-03-09 DIAGNOSIS — Z86010 Personal history of colonic polyps: Secondary | ICD-10-CM | POA: Diagnosis not present

## 2018-03-09 DIAGNOSIS — R7989 Other specified abnormal findings of blood chemistry: Secondary | ICD-10-CM | POA: Diagnosis not present

## 2018-03-09 DIAGNOSIS — F419 Anxiety disorder, unspecified: Secondary | ICD-10-CM | POA: Diagnosis present

## 2018-03-09 DIAGNOSIS — E1165 Type 2 diabetes mellitus with hyperglycemia: Secondary | ICD-10-CM | POA: Diagnosis present

## 2018-03-09 DIAGNOSIS — I25708 Atherosclerosis of coronary artery bypass graft(s), unspecified, with other forms of angina pectoris: Secondary | ICD-10-CM | POA: Diagnosis not present

## 2018-03-09 DIAGNOSIS — K219 Gastro-esophageal reflux disease without esophagitis: Secondary | ICD-10-CM | POA: Diagnosis present

## 2018-03-09 DIAGNOSIS — Z8673 Personal history of transient ischemic attack (TIA), and cerebral infarction without residual deficits: Secondary | ICD-10-CM | POA: Diagnosis not present

## 2018-03-09 DIAGNOSIS — Z955 Presence of coronary angioplasty implant and graft: Secondary | ICD-10-CM | POA: Diagnosis not present

## 2018-03-09 DIAGNOSIS — T82855A Stenosis of coronary artery stent, initial encounter: Secondary | ICD-10-CM | POA: Diagnosis present

## 2018-03-09 DIAGNOSIS — Z6841 Body Mass Index (BMI) 40.0 and over, adult: Secondary | ICD-10-CM | POA: Diagnosis not present

## 2018-03-09 DIAGNOSIS — I440 Atrioventricular block, first degree: Secondary | ICD-10-CM | POA: Diagnosis present

## 2018-03-09 DIAGNOSIS — I130 Hypertensive heart and chronic kidney disease with heart failure and stage 1 through stage 4 chronic kidney disease, or unspecified chronic kidney disease: Secondary | ICD-10-CM | POA: Diagnosis present

## 2018-03-09 DIAGNOSIS — N400 Enlarged prostate without lower urinary tract symptoms: Secondary | ICD-10-CM | POA: Diagnosis present

## 2018-03-09 DIAGNOSIS — Y831 Surgical operation with implant of artificial internal device as the cause of abnormal reaction of the patient, or of later complication, without mention of misadventure at the time of the procedure: Secondary | ICD-10-CM | POA: Diagnosis present

## 2018-03-09 DIAGNOSIS — G250 Essential tremor: Secondary | ICD-10-CM | POA: Diagnosis present

## 2018-03-09 DIAGNOSIS — E1122 Type 2 diabetes mellitus with diabetic chronic kidney disease: Secondary | ICD-10-CM | POA: Diagnosis present

## 2018-03-09 DIAGNOSIS — E785 Hyperlipidemia, unspecified: Secondary | ICD-10-CM | POA: Diagnosis present

## 2018-03-09 DIAGNOSIS — N179 Acute kidney failure, unspecified: Secondary | ICD-10-CM | POA: Diagnosis not present

## 2018-03-09 DIAGNOSIS — R0789 Other chest pain: Secondary | ICD-10-CM | POA: Diagnosis not present

## 2018-03-09 DIAGNOSIS — Z9842 Cataract extraction status, left eye: Secondary | ICD-10-CM | POA: Diagnosis not present

## 2018-03-09 DIAGNOSIS — G4733 Obstructive sleep apnea (adult) (pediatric): Secondary | ICD-10-CM | POA: Diagnosis present

## 2018-03-09 DIAGNOSIS — I2582 Chronic total occlusion of coronary artery: Secondary | ICD-10-CM | POA: Diagnosis present

## 2018-03-09 HISTORY — PX: LEFT HEART CATH AND CORS/GRAFTS ANGIOGRAPHY: CATH118250

## 2018-03-09 LAB — GLUCOSE, CAPILLARY
GLUCOSE-CAPILLARY: 240 mg/dL — AB (ref 65–99)
GLUCOSE-CAPILLARY: 255 mg/dL — AB (ref 65–99)
Glucose-Capillary: 232 mg/dL — ABNORMAL HIGH (ref 65–99)
Glucose-Capillary: 212 mg/dL — ABNORMAL HIGH (ref 65–99)
Glucose-Capillary: 252 mg/dL — ABNORMAL HIGH (ref 65–99)

## 2018-03-09 LAB — BASIC METABOLIC PANEL
Anion gap: 7 (ref 5–15)
BUN: 12 mg/dL (ref 6–20)
CHLORIDE: 100 mmol/L — AB (ref 101–111)
CO2: 29 mmol/L (ref 22–32)
Calcium: 8.6 mg/dL — ABNORMAL LOW (ref 8.9–10.3)
Creatinine, Ser: 1.3 mg/dL — ABNORMAL HIGH (ref 0.61–1.24)
GFR calc Af Amer: >60 mL/min (ref >60)
GFR calc non Af Amer: 52 mL/min — ABNORMAL LOW (ref >60)
GLUCOSE: 267 mg/dL — AB (ref 65–99)
POTASSIUM: 4.1 mmol/L (ref 3.5–5.1)
Sodium: 136 mmol/L (ref 135–145)

## 2018-03-09 LAB — TROPONIN I: Troponin I: <0.03 ng/mL (ref <0.03)

## 2018-03-09 SURGERY — LEFT HEART CATH AND CORS/GRAFTS ANGIOGRAPHY
Anesthesia: LOCAL

## 2018-03-09 MED ORDER — NITROGLYCERIN IN D5W 200-5 MCG/ML-% IV SOLN
5.0000 ug/min | INTRAVENOUS | Status: DC
  Administered 2018-03-09: 10 ug/min via INTRAVENOUS

## 2018-03-09 MED ORDER — LIDOCAINE HCL (PF) 1 % IJ SOLN
INTRAMUSCULAR | Status: DC | PRN
  Administered 2018-03-09: 2 mL via SUBCUTANEOUS

## 2018-03-09 MED ORDER — VERAPAMIL HCL 2.5 MG/ML IV SOLN
INTRAVENOUS | Status: AC
  Filled 2018-03-09: qty 2

## 2018-03-09 MED ORDER — MIDAZOLAM HCL 2 MG/2ML IJ SOLN
INTRAMUSCULAR | Status: AC
  Filled 2018-03-09: qty 2

## 2018-03-09 MED ORDER — FENTANYL CITRATE (PF) 100 MCG/2ML IJ SOLN
INTRAMUSCULAR | Status: DC | PRN
  Administered 2018-03-09: 50 ug via INTRAVENOUS

## 2018-03-09 MED ORDER — MIDAZOLAM HCL 2 MG/2ML IJ SOLN
INTRAMUSCULAR | Status: DC | PRN
  Administered 2018-03-09: 1 mg via INTRAVENOUS

## 2018-03-09 MED ORDER — NITROGLYCERIN IN D5W 200-5 MCG/ML-% IV SOLN
INTRAVENOUS | Status: AC
  Filled 2018-03-09: qty 250

## 2018-03-09 MED ORDER — HEPARIN SODIUM (PORCINE) 1000 UNIT/ML IJ SOLN
INTRAMUSCULAR | Status: DC | PRN
  Administered 2018-03-09: 5000 [IU] via INTRAVENOUS

## 2018-03-09 MED ORDER — HEPARIN SODIUM (PORCINE) 1000 UNIT/ML IJ SOLN
INTRAMUSCULAR | Status: AC
Start: 2018-03-09 — End: ?
  Filled 2018-03-09: qty 1

## 2018-03-09 MED ORDER — VERAPAMIL HCL 2.5 MG/ML IV SOLN
INTRAVENOUS | Status: DC | PRN
  Administered 2018-03-09: 10 mL via INTRA_ARTERIAL

## 2018-03-09 MED ORDER — HEPARIN (PORCINE) IN NACL 1000-0.9 UT/500ML-% IV SOLN
INTRAVENOUS | Status: AC
Start: 2018-03-09 — End: ?
  Filled 2018-03-09: qty 1000

## 2018-03-09 MED ORDER — FENTANYL CITRATE (PF) 100 MCG/2ML IJ SOLN
INTRAMUSCULAR | Status: AC
  Filled 2018-03-09: qty 2

## 2018-03-09 MED ORDER — MORPHINE SULFATE (PF) 2 MG/ML IV SOLN: 2.0000 mg | INTRAVENOUS | Status: DC | PRN

## 2018-03-09 MED ORDER — LIDOCAINE HCL (PF) 1 % IJ SOLN
INTRAMUSCULAR | Status: AC
Start: 2018-03-09 — End: ?
  Filled 2018-03-09: qty 30

## 2018-03-09 SURGICAL SUPPLY — 15 items
CATH INFINITI 5FR MULTPACK ANG (CATHETERS) ×2 IMPLANT
CATH LAUNCHER 5F EBU3.0 (CATHETERS) ×1 IMPLANT
CATHETER LAUNCHER 5F EBU3.0 (CATHETERS) ×2
COVER PRB 48X5XTLSCP FOLD TPE (BAG) ×1 IMPLANT
COVER PROBE 5X48 (BAG) ×1
DEVICE RAD COMP TR BAND LRG (VASCULAR PRODUCTS) ×2 IMPLANT
GLIDESHEATH SLEND A-KIT 6F 22G (SHEATH) ×2 IMPLANT
GUIDEWIRE INQWIRE 1.5J.035X260 (WIRE) ×1 IMPLANT
HOVERMATT SINGLE USE (MISCELLANEOUS) ×2 IMPLANT
INQWIRE 1.5J .035X260CM (WIRE) ×2
KIT HEART LEFT (KITS) ×2 IMPLANT
PACK CARDIAC CATHETERIZATION (CUSTOM PROCEDURE TRAY) ×2 IMPLANT
SHIELD RADPAD SCOOP 12X17 (MISCELLANEOUS) ×2 IMPLANT
TRANSDUCER W/STOPCOCK (MISCELLANEOUS) ×2 IMPLANT
TUBING CIL FLEX 10 FLL-RA (TUBING) ×2 IMPLANT

## 2018-03-09 NOTE — Progress Notes (Signed)
Dr. Sallyanne Kuster to bedside and he is ordering cardiac cath for today.  He instructed patient not to eat and for me not to give am bolus dose of Lasix IV.  Patient updated on plan of care.

## 2018-03-09 NOTE — Progress Notes (Signed)
Mr. Tonne transferred from room 3E18 to 3E25.  Other patient needed negative pressure room (3E18).  Patient contacted his brother and informed him of new room number.

## 2018-03-09 NOTE — Progress Notes (Signed)
Pt refusing second nitro SL, PA stated she called MD Croitoru and he is on his way  NT completing EKG

## 2018-03-09 NOTE — Progress Notes (Signed)
Progress Note  Patient Name: Edward [name] Date of Encounter: 03/09/2018  Primary Cardiologist: [name], MD   Subjective   Chest pain waxed and waned overnight, 8/10 this morning. Enzymes negative and ECG unchanged. Has had substantial diuresis judging by leg edema, but I suspect in/out were not accurately recorded in ED (I personally saw an emptied 1 L urinal, total UO recorded only 725 mL). Creat increased slightly to 1.3.  Inpatient Medications    Scheduled Meds: . amLODipine  10 mg Oral Daily  . aspirin EC  81 mg Oral Daily  . clopidogrel  75 mg Oral Daily  . enoxaparin (LOVENOX) injection  40 mg Subcutaneous Q24H  . gabapentin  300 mg Oral BID  . insulin aspart  0-15 Units Subcutaneous TID WC  . insulin aspart  0-5 Units Subcutaneous QHS  . isosorbide mononitrate  15 mg Oral Daily  . lisinopril  20 mg Oral Daily  . pantoprazole  40 mg Oral Daily  . primidone  25 mg Oral BID  . propranolol  40 mg Oral TID  . rosuvastatin  20 mg Oral Daily  . sodium chloride flush  3 mL Intravenous Q12H   Continuous Infusions: . sodium chloride     PRN Meds: sodium chloride, acetaminophen, albuterol, clonazePAM, meclizine, morphine injection, nitroGLYCERIN, ondansetron (ZOFRAN) IV, sodium chloride flush   Vital Signs    Vitals:   03/09/18 0031 03/09/18 0500 03/09/18 0810 03/09/18 0817  BP: 135/63 130/68 (!) 162/67 109/61  Pulse: (!) 59 61 65 61  Resp: 20 20 18 18   Temp: (!) 97.5 F (36.4 C) 97.7 F (36.5 C) (!) 97.4 F (36.3 C)   TempSrc: Oral Oral Oral   SpO2: 100% 100%    Weight:  274 lb 3.2 oz (124.4 kg)      Intake/Output Summary (Last 24 hours) at 03/09/2018 0842 Last data filed at 03/09/2018 0200 Gross per 24 hour  Intake 232 ml  Output 725 ml  Net -493 ml   Filed Weights   03/08/18 1203 03/08/18 2026 03/09/18 0500  Weight: 279 lb (126.6 kg) 274 lb 14.4 oz (124.7 kg) 274 lb 3.2 oz (124.4 kg)    Telemetry    NSR - Personally Reviewed  ECG      NSR, 1st deg AVB, Q waves V1-V2, no ischemic ST-T changes  - Personally Reviewed  Physical Exam  Obese, lying flat in bed GEN: No acute distress.   Neck: No JVD Cardiac: RRR, no murmurs, rubs, or gallops.  Respiratory: Clear to auscultation bilaterally. GI: Soft, nontender, non-distended  MS: Improved edema, some wrinkles, still 1+ edema; No deformity. Neuro:  Nonfocal  Psych: Normal affect   Labs    Chemistry Recent Labs  Lab 03/07/18 0953 03/08/18 1228 03/09/18 0605  NA 140 139 136  K 4.1 4.1 4.1  CL 100 104 100*  CO2 27 28 29   GLUCOSE 148* 94 267*  BUN 11 9 12   CREATININE 1.09 1.09 1.30*  CALCIUM 9.0 8.6* 8.6*  PROT 6.4 5.8*  --   ALBUMIN 3.7 3.0*  --   AST 18 19  --   ALT 17 17  --   ALKPHOS 100 79  --   BILITOT 0.5 0.5  --   GFRNONAA 66 >60 52*  GFRAA 76 >60 >60  ANIONGAP  --  7 7     Hematology Recent Labs  Lab 03/08/18 1228  WBC 5.4  RBC 4.18*  HGB 11.9*  HCT 36.1*  MCV 86.4  MCH 28.5  MCHC 33.0  RDW 13.6  PLT 222    Cardiac Enzymes Recent Labs  Lab 03/08/18 1228 03/08/18 1746 03/09/18 0003 03/09/18 0605  TROPONINI <0.03 <0.03 <0.03 <0.03   No results for input(s): TROPIPOC in the last 168 hours.   BNP Recent Labs  Lab 03/08/18 1228  BNP 100.9*     DDimer No results for input(s): DDIMER in the last 168 hours.   Radiology    Dg Chest 2 View  Result Date: 03/08/2018 CLINICAL DATA:  Chest pain. EXAM: CHEST - 2 VIEW COMPARISON:  Radiographs of October 13, 2017. FINDINGS: Stable cardiomediastinal silhouette. Status post coronary artery bypass graft. No pneumothorax or pleural effusion is noted. Both lungs are clear. The visualized skeletal structures are unremarkable. IMPRESSION: No active cardiopulmonary disease. Electronically Signed   By: Marijo Conception, M.D.   On: 03/08/2018 13:21    Cardiac Studies   Echocardiogram 05/2017: Study Conclusions  - Left ventricle: The cavity size was normal. There was severe concentric  hypertrophy. Systolic function was normal. The estimated ejection fraction was in the range of 60% to 65%. Wall motion was normal; there were no regional wall motion abnormalities. Doppler parameters are consistent with abnormal left ventricular relaxation (grade 1 diastolic dysfunction). There was no evidence of elevated ventricular filling pressure by Doppler parameters. - Aortic valve: Trileaflet; normal thickness leaflets. There was no regurgitation. - Aortic root: The aortic root was normal in size. - Mitral valve: There was no regurgitation. - Left atrium: The atrium was mildly dilated. - Right ventricle: Systolic function was normal. - Tricuspid valve: There was mild regurgitation. - Pulmonary arteries: Systolic pressure was within the normal range. - Inferior vena cava: The vessel was not visualized. - Pericardium, extracardiac: There was no pericardial effusion.    Patient Profile     77 y.o. 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   Assessment & Plan    Symptoms persist despite diuresis. ECG and enzymes do not support an acute coronary syndrome, but he describes his pain as identical to what he experienced before LAD stent and before CABG. Plan coronary/graft angio +/- PCI with Dr. Tamala Julian later today. This procedure has been fully reviewed with the patient and written informed consent has been obtained.. Hold diuretics, can resume post-procedure, based on LVEDP.  For questions or updates, please contact [place] Please consult [email or web address] for contact info under Cardiology/STEMI.      Signed, Sanda Klein, MD  03/09/2018, 8:42 AM

## 2018-03-09 NOTE — Progress Notes (Signed)
Pt reports 8/10 chest pain radiationg to left arm. Pt sitting up in the chair talking. Vital signs documented, 1 SL nitro given. Education provided to pt regarding nitro, pt understands, stating "I take it all the time" Paged PA for cardiology, she stated she will review chart and call RN back

## 2018-03-09 NOTE — Interval H&P Note (Signed)
Cath Lab Visit (complete for each Cath Lab visit)  Clinical Evaluation Leading to the Procedure:   ACS: Yes.    Non-ACS:    Anginal Classification: CCS III  Anti-ischemic medical therapy: Minimal Therapy (1 class of medications)  Non-Invasive Test Results: No non-invasive testing performed  Prior CABG: Previous CABG      History and Physical Interval Note:  03/09/2018 9:54 AM  Edward [name]  has presented today for surgery, with the diagnosis of cp, cad  The various methods of treatment have been discussed with the patient and family. After consideration of risks, benefits and other options for treatment, the patient has consented to  Procedure(s): LEFT HEART CATH AND CORS/GRAFTS ANGIOGRAPHY (N/A) as a surgical intervention .  The patient's history has been reviewed, patient examined, no change in status, stable for surgery.  I have reviewed the patient's chart and labs.  Questions were answered to the patient's satisfaction.     [name]

## 2018-03-09 NOTE — Progress Notes (Signed)
Inpatient Diabetes Program Recommendations  AACE/ADA: New Consensus Statement on Inpatient Glycemic Control (2015)  Target Ranges:  Prepandial:   less than 140 mg/dL      Peak postprandial:   less than 180 mg/dL (1-2 hours)      Critically ill patients:  140 - 180 mg/dL   Lab Results  Component Value Date   GLUCAP 232 (H) 03/09/2018   HGBA1C 9.8 11/06/2017    Review of Glycemic ControlResults for BRANCE, DARTT (MRN [id number]) as of 03/09/2018 10:44  Ref. Range 03/08/2018 14:01 03/08/2018 18:05 03/08/2018 22:11 03/09/2018 07:31  Glucose-Capillary Latest Ref Range: 65 - 99 mg/dL 55 (L) 168 (H) 287 (H) 232 (H)   Diabetes history: Type 2 DM  Outpatient Diabetes medications: NPH 40 units bid, U500 20 units tid with meals Current orders for Inpatient glycemic control:  Novolog moderate tid with meals  Inpatient Diabetes Program Recommendations:   May consider adding Levemir 20 units bid.  Also consider adding Novolog meal coverage 6 units tid with meals (hold if patient eats less than 50%).   Thanks,  Adah Perl, RN, BC-ADM Inpatient Diabetes Coordinator Pager [phone] (8a-5p)

## 2018-03-09 NOTE — Progress Notes (Signed)
Cath lab called and informed patient had his SSI of 5 units for CBG of 232 and he was made NPO so has not eaten this morning.

## 2018-03-09 NOTE — H&P (View-Only) (Signed)
Progress Note  Patient Name: Edward [name] Date of Encounter: 03/09/2018  Primary Cardiologist: [name], MD   Subjective   Chest pain waxed and waned overnight, 8/10 this morning. Enzymes negative and ECG unchanged. Has had substantial diuresis judging by leg edema, but I suspect in/out were not accurately recorded in ED (I personally saw an emptied 1 L urinal, total UO recorded only 725 mL). Creat increased slightly to 1.3.  Inpatient Medications    Scheduled Meds: . amLODipine  10 mg Oral Daily  . aspirin EC  81 mg Oral Daily  . clopidogrel  75 mg Oral Daily  . enoxaparin (LOVENOX) injection  40 mg Subcutaneous Q24H  . gabapentin  300 mg Oral BID  . insulin aspart  0-15 Units Subcutaneous TID WC  . insulin aspart  0-5 Units Subcutaneous QHS  . isosorbide mononitrate  15 mg Oral Daily  . lisinopril  20 mg Oral Daily  . pantoprazole  40 mg Oral Daily  . primidone  25 mg Oral BID  . propranolol  40 mg Oral TID  . rosuvastatin  20 mg Oral Daily  . sodium chloride flush  3 mL Intravenous Q12H   Continuous Infusions: . sodium chloride     PRN Meds: sodium chloride, acetaminophen, albuterol, clonazePAM, meclizine, morphine injection, nitroGLYCERIN, ondansetron (ZOFRAN) IV, sodium chloride flush   Vital Signs    Vitals:   03/09/18 0031 03/09/18 0500 03/09/18 0810 03/09/18 0817  BP: 135/63 130/68 (!) 162/67 109/61  Pulse: (!) 59 61 65 61  Resp: 20 20 18 18   Temp: (!) 97.5 F (36.4 C) 97.7 F (36.5 C) (!) 97.4 F (36.3 C)   TempSrc: Oral Oral Oral   SpO2: 100% 100%    Weight:  274 lb 3.2 oz (124.4 kg)      Intake/Output Summary (Last 24 hours) at 03/09/2018 0842 Last data filed at 03/09/2018 0200 Gross per 24 hour  Intake 232 ml  Output 725 ml  Net -493 ml   Filed Weights   03/08/18 1203 03/08/18 2026 03/09/18 0500  Weight: 279 lb (126.6 kg) 274 lb 14.4 oz (124.7 kg) 274 lb 3.2 oz (124.4 kg)    Telemetry    NSR - Personally Reviewed  ECG      NSR, 1st deg AVB, Q waves V1-V2, no ischemic ST-T changes  - Personally Reviewed  Physical Exam  Obese, lying flat in bed GEN: No acute distress.   Neck: No JVD Cardiac: RRR, no murmurs, rubs, or gallops.  Respiratory: Clear to auscultation bilaterally. GI: Soft, nontender, non-distended  MS: Improved edema, some wrinkles, still 1+ edema; No deformity. Neuro:  Nonfocal  Psych: Normal affect   Labs    Chemistry Recent Labs  Lab 03/07/18 0953 03/08/18 1228 03/09/18 0605  NA 140 139 136  K 4.1 4.1 4.1  CL 100 104 100*  CO2 27 28 29   GLUCOSE 148* 94 267*  BUN 11 9 12   CREATININE 1.09 1.09 1.30*  CALCIUM 9.0 8.6* 8.6*  PROT 6.4 5.8*  --   ALBUMIN 3.7 3.0*  --   AST 18 19  --   ALT 17 17  --   ALKPHOS 100 79  --   BILITOT 0.5 0.5  --   GFRNONAA 66 >60 52*  GFRAA 76 >60 >60  ANIONGAP  --  7 7     Hematology Recent Labs  Lab 03/08/18 1228  WBC 5.4  RBC 4.18*  HGB 11.9*  HCT 36.1*  MCV 86.4  MCH 28.5  MCHC 33.0  RDW 13.6  PLT 222    Cardiac Enzymes Recent Labs  Lab 03/08/18 1228 03/08/18 1746 03/09/18 0003 03/09/18 0605  TROPONINI <0.03 <0.03 <0.03 <0.03   No results for input(s): TROPIPOC in the last 168 hours.   BNP Recent Labs  Lab 03/08/18 1228  BNP 100.9*     DDimer No results for input(s): DDIMER in the last 168 hours.   Radiology    Dg Chest 2 View  Result Date: 03/08/2018 CLINICAL DATA:  Chest pain. EXAM: CHEST - 2 VIEW COMPARISON:  Radiographs of October 13, 2017. FINDINGS: Stable cardiomediastinal silhouette. Status post coronary artery bypass graft. No pneumothorax or pleural effusion is noted. Both lungs are clear. The visualized skeletal structures are unremarkable. IMPRESSION: No active cardiopulmonary disease. Electronically Signed   By: Marijo Conception, M.D.   On: 03/08/2018 13:21    Cardiac Studies   Echocardiogram 05/2017: Study Conclusions  - Left ventricle: The cavity size was normal. There was severe concentric  hypertrophy. Systolic function was normal. The estimated ejection fraction was in the range of 60% to 65%. Wall motion was normal; there were no regional wall motion abnormalities. Doppler parameters are consistent with abnormal left ventricular relaxation (grade 1 diastolic dysfunction). There was no evidence of elevated ventricular filling pressure by Doppler parameters. - Aortic valve: Trileaflet; normal thickness leaflets. There was no regurgitation. - Aortic root: The aortic root was normal in size. - Mitral valve: There was no regurgitation. - Left atrium: The atrium was mildly dilated. - Right ventricle: Systolic function was normal. - Tricuspid valve: There was mild regurgitation. - Pulmonary arteries: Systolic pressure was within the normal range. - Inferior vena cava: The vessel was not visualized. - Pericardium, extracardiac: There was no pericardial effusion.    Patient Profile     77 y.o. 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   Assessment & Plan    Symptoms persist despite diuresis. ECG and enzymes do not support an acute coronary syndrome, but he describes his pain as identical to what he experienced before LAD stent and before CABG. Plan coronary/graft angio +/- PCI with Dr. Tamala Julian later today. This procedure has been fully reviewed with the patient and written informed consent has been obtained.. Hold diuretics, can resume post-procedure, based on LVEDP.  For questions or updates, please contact [place] Please consult [email or web address] for contact info under Cardiology/STEMI.      Signed, Sanda Klein, MD  03/09/2018, 8:42 AM

## 2018-03-10 ENCOUNTER — Other Ambulatory Visit: Payer: Self-pay

## 2018-03-10 ENCOUNTER — Inpatient Hospital Stay (HOSPITAL_COMMUNITY): Payer: Medicare Other

## 2018-03-10 DIAGNOSIS — I25709 Atherosclerosis of coronary artery bypass graft(s), unspecified, with unspecified angina pectoris: Secondary | ICD-10-CM

## 2018-03-10 DIAGNOSIS — I10 Essential (primary) hypertension: Secondary | ICD-10-CM

## 2018-03-10 LAB — GLUCOSE, CAPILLARY
Glucose-Capillary: 290 mg/dL — ABNORMAL HIGH (ref 65–99)
Glucose-Capillary: 306 mg/dL — ABNORMAL HIGH (ref 65–99)
Glucose-Capillary: 294 mg/dL — ABNORMAL HIGH (ref 65–99)
Glucose-Capillary: 346 mg/dL — ABNORMAL HIGH (ref 65–99)

## 2018-03-10 LAB — BASIC METABOLIC PANEL
Anion gap: 9 (ref 5–15)
BUN: 12 mg/dL (ref 6–20)
CALCIUM: 8.5 mg/dL — AB (ref 8.9–10.3)
CO2: 27 mmol/L (ref 22–32)
CREATININE: 1.2 mg/dL (ref 0.61–1.24)
Chloride: 100 mmol/L — ABNORMAL LOW (ref 101–111)
GFR calc non Af Amer: 57 mL/min — ABNORMAL LOW (ref >60)
GLUCOSE: 353 mg/dL — AB (ref 65–99)
Potassium: 4.1 mmol/L (ref 3.5–5.1)
Sodium: 136 mmol/L (ref 135–145)

## 2018-03-10 LAB — D-DIMER, QUANTITATIVE (NOT AT ARMC): D DIMER QUANT: 0.53 ug{FEU}/mL — AB (ref 0.00–0.50)

## 2018-03-10 MED ORDER — ACETAMINOPHEN 325 MG PO TABS
650.0000 mg | ORAL_TABLET | Freq: Four times a day (QID) | ORAL | Status: DC | PRN
  Administered 2018-03-10: 650 mg via ORAL
  Filled 2018-03-10: qty 2

## 2018-03-10 MED ORDER — IOPAMIDOL (ISOVUE-370) INJECTION 76%
100.0000 mL | Freq: Once | INTRAVENOUS | Status: AC | PRN
  Administered 2018-03-10: 100 mL via INTRAVENOUS

## 2018-03-10 MED ORDER — INSULIN DETEMIR 100 UNIT/ML ~~LOC~~ SOLN
20.0000 [IU] | Freq: Two times a day (BID) | SUBCUTANEOUS | Status: DC
Start: 2018-03-10 — End: 2018-03-11
  Administered 2018-03-10 – 2018-03-11 (×3): 20 [IU] via SUBCUTANEOUS
  Filled 2018-03-10 (×4): qty 0.2

## 2018-03-10 MED ORDER — IOPAMIDOL (ISOVUE-370) INJECTION 76%
INTRAVENOUS | Status: AC
  Filled 2018-03-10: qty 100

## 2018-03-10 MED ORDER — FUROSEMIDE 10 MG/ML IJ SOLN
40.0000 mg | Freq: Two times a day (BID) | INTRAMUSCULAR | Status: DC
  Administered 2018-03-10 – 2018-03-11 (×3): 40 mg via INTRAVENOUS
  Filled 2018-03-10 (×3): qty 4

## 2018-03-10 NOTE — Progress Notes (Signed)
Patient's d-dimer elevated and CT chest ordered.  Patient aware of exam per conversation with [place] from cardiology.

## 2018-03-10 NOTE — Progress Notes (Signed)
Progress Note  Patient Name: Edward [name] Date of Encounter: 03/10/2018  Primary Cardiologist: Sinclair Grooms, MD   Subjective   Chest pain improved, but persistent.   Inpatient Medications    Scheduled Meds: . amLODipine  10 mg Oral Daily  . aspirin EC  81 mg Oral Daily  . clopidogrel  75 mg Oral Daily  . enoxaparin (LOVENOX) injection  40 mg Subcutaneous Q24H  . gabapentin  300 mg Oral BID  . insulin aspart  0-15 Units Subcutaneous TID WC  . insulin aspart  0-5 Units Subcutaneous QHS  . isosorbide mononitrate  15 mg Oral Daily  . lisinopril  20 mg Oral Daily  . pantoprazole  40 mg Oral Daily  . primidone  25 mg Oral BID  . propranolol  40 mg Oral TID  . rosuvastatin  20 mg Oral Daily  . sodium chloride flush  3 mL Intravenous Q12H   Continuous Infusions: . sodium chloride     PRN Meds: sodium chloride, acetaminophen, albuterol, clonazePAM, meclizine, morphine injection, nitroGLYCERIN, ondansetron (ZOFRAN) IV, sodium chloride flush   Vital Signs    Vitals:   03/09/18 1430 03/09/18 1941 03/10/18 0355 03/10/18 0800  BP: (!) 122/59 (!) 143/71 (!) 155/62   Pulse: 63 70 63   Resp:  18 18   Temp:  (!) 97.3 F (36.3 C) 97.9 F (36.6 C)   TempSrc:  Oral Oral   SpO2: 98% 98% 99%   Weight:   275 lb 14.4 oz (125.1 kg)   Height:    5\' 8"  (1.727 m)    Intake/Output Summary (Last 24 hours) at 03/10/2018 0909 Last data filed at 03/09/2018 2215 Gross per 24 hour  Intake 1182 ml  Output 650 ml  Net 532 ml   Filed Weights   03/08/18 2026 03/09/18 0500 03/10/18 0355  Weight: 274 lb 14.4 oz (124.7 kg) 274 lb 3.2 oz (124.4 kg) 275 lb 14.4 oz (125.1 kg)    Telemetry    NSR - Personally Reviewed  ECG    NSR, 1st deg AVB, Q waves V1-V2, no ischemic ST-T changes  - Personally Reviewed  Physical Exam  Obese, lying flat in bed GEN: No acute distress.   Neck: No JVD Cardiac: RRR, no murmurs, rubs, or gallops.  Respiratory: Clear to auscultation bilaterally. GI:  Soft, nontender, non-distended  MS: Improved edema, some wrinkles, still 1+ edema; No deformity. Neuro:  Nonfocal  Psych: Normal affect   Labs    Chemistry Recent Labs  Lab 03/07/18 0953 03/08/18 1228 03/09/18 0605 03/10/18 0504  NA 140 139 136 136  K 4.1 4.1 4.1 4.1  CL 100 104 100* 100*  CO2 27 28 29 27   GLUCOSE 148* 94 267* 353*  BUN 11 9 12 12   CREATININE 1.09 1.09 1.30* 1.20  CALCIUM 9.0 8.6* 8.6* 8.5*  PROT 6.4 5.8*  --   --   ALBUMIN 3.7 3.0*  --   --   AST 18 19  --   --   ALT 17 17  --   --   ALKPHOS 100 79  --   --   BILITOT 0.5 0.5  --   --   GFRNONAA 66 >60 52* 57*  GFRAA 76 >60 >60 >60  ANIONGAP  --  7 7 9      Hematology Recent Labs  Lab 03/08/18 1228  WBC 5.4  RBC 4.18*  HGB 11.9*  HCT 36.1*  MCV 86.4  MCH 28.5  MCHC  33.0  RDW 13.6  PLT 222    Cardiac Enzymes Recent Labs  Lab 03/08/18 1228 03/08/18 1746 03/09/18 0003 03/09/18 0605  TROPONINI <0.03 <0.03 <0.03 <0.03   No results for input(s): TROPIPOC in the last 168 hours.   BNP Recent Labs  Lab 03/08/18 1228  BNP 100.9*     DDimer No results for input(s): DDIMER in the last 168 hours.   Radiology    Dg Chest 2 View  Result Date: 03/08/2018 CLINICAL DATA:  Chest pain. EXAM: CHEST - 2 VIEW COMPARISON:  Radiographs of October 13, 2017. FINDINGS: Stable cardiomediastinal silhouette. Status post coronary artery bypass graft. No pneumothorax or pleural effusion is noted. Both lungs are clear. The visualized skeletal structures are unremarkable. IMPRESSION: No active cardiopulmonary disease. Electronically Signed   By: Marijo Conception, M.D.   On: 03/08/2018 13:21    Cardiac Studies   Echocardiogram 05/2017: Study Conclusions  - Left ventricle: The cavity size was normal. There was severe concentric hypertrophy. Systolic function was normal. The estimated ejection fraction was in the range of 60% to 65%. Wall motion was normal; there were no regional wall  motion abnormalities. Doppler parameters are consistent with abnormal left ventricular relaxation (grade 1 diastolic dysfunction). There was no evidence of elevated ventricular filling pressure by Doppler parameters. - Aortic valve: Trileaflet; normal thickness leaflets. There was no regurgitation. - Aortic root: The aortic root was normal in size. - Mitral valve: There was no regurgitation. - Left atrium: The atrium was mildly dilated. - Right ventricle: Systolic function was normal. - Tricuspid valve: There was mild regurgitation. - Pulmonary arteries: Systolic pressure was within the normal range. - Inferior vena cava: The vessel was not visualized. - Pericardium, extracardiac: There was no pericardial effusion.  LHC: 03/09/18  Difficult procedure from left radial approach due to difficulty with torque control.  Would advise femoral approach in the future.  Eccentric 60% proximal LAD.  Widely patent mid LAD stent.  Antegrade flow to the apex.  Widely patent left main.  Totally occluded ostial circumflex to proximal circumflex.  Proximal mid and distal RCA moderate disease with tandem 65 to 75% % distal segmental stenoses.  Patent saphenous vein graft to the second obtuse marginal which supplies the entire circumflex territory.  Widely patent LIMA to the distal segment of the LAD.  Competitive flow is noted.  Low normal LV systolic function with EF 50 to 55%.  Mildly elevated end-diastolic pressure.  Continuous sharp left chest pain not felt to be ischemia related.  RECOMMENDATIONS:   Consider musculoskeletal or neuropathic pain as a source of the patient's presentation.  Continue aggressive risk factor modification for secondary prevention of ischemic events.  If No Local complications, could be discharged later today.   Patient Profile     77 y.o. male with extensive CAD history, previous mid LAD stent, CABG x 2 in January 2018 for left main coronary  disease, acute on chronic diastolic heart failure, DM with neuropathy, presents with chest pain at rest.   Assessment & Plan     Symptoms persisted despite diuresis. ECG and enzymes do not support an acute coronary syndrome, he underwent cardiac cath yesterday with patent bypasses, Dr Tamala Julian is suggesting musculoskeletal or neuropathic pain as a source of the patient's presentation. Continue aggressive risk factor modification for secondary prevention of ischemic events.  He is still mildly fluid overloaded, Crea 1.2 today, I will restart lasix 40 mg iv BID for 2 doses  We will obtain neurology  consult  Anticipated discharge tomorrow   For questions or updates, please contact [place] Please consult [email or web address] for contact info under Cardiology/STEMI.      Signed, Ena Dawley, MD  03/10/2018, 9:09 AM

## 2018-03-10 NOTE — Plan of Care (Signed)
  Problem: Nutrition: Goal: Adequate nutrition will be maintained Outcome: Completed/Met   Problem: Elimination: Goal: Will not experience complications related to bowel motility Outcome: Completed/Met   Problem: Safety: Goal: Ability to remain free from injury will improve Outcome: Completed/Met   Problem: Education: Goal: Understanding of CV disease, CV risk reduction, and recovery process will improve Outcome: Completed/Met   Problem: Activity: Goal: Ability to return to baseline activity level will improve Outcome: Completed/Met

## 2018-03-10 NOTE — Progress Notes (Signed)
Patient refused bed alarm. Will continue to monitor patient. 

## 2018-03-10 NOTE — Progress Notes (Signed)
Patient up to BR. Refused bed alarm @ this time.

## 2018-03-10 NOTE — Progress Notes (Addendum)
   Was asked to call Neurology consult for neuropathic pain. Reviewed with Neurology and overall patient's symptoms felt to be atypical for this. They recommended checking d-dimer to rule-out PE. Continue Gabapentin at current dosing. Can follow-up with Neurology as an outpatient if symptoms persist.   Signed, Erma Heritage, PA-C 03/10/2018, 12:45 PM Pager: [phone]   D-dimer resulted back slightly positive at 0.53. With his persistent chest discomfort, will plan for a CTA to rule-out PE. Creatinine stable at 1.20. Reviewed plan with the patient.   Signed, Erma Heritage, PA-C 03/10/2018, 5:43 PM Pager: [phone]

## 2018-03-11 ENCOUNTER — Other Ambulatory Visit: Payer: Self-pay | Admitting: Student

## 2018-03-11 DIAGNOSIS — N183 Chronic kidney disease, stage 3 unspecified: Secondary | ICD-10-CM

## 2018-03-11 LAB — BASIC METABOLIC PANEL
Anion gap: 5 (ref 5–15)
BUN: 16 mg/dL (ref 6–20)
CALCIUM: 8.9 mg/dL (ref 8.9–10.3)
CHLORIDE: 100 mmol/L — AB (ref 101–111)
CO2: 31 mmol/L (ref 22–32)
Creatinine, Ser: 1.47 mg/dL — ABNORMAL HIGH (ref 0.61–1.24)
GFR calc non Af Amer: 45 mL/min — ABNORMAL LOW (ref >60)
GFR, EST AFRICAN AMERICAN: 52 mL/min — AB (ref >60)
Glucose, Bld: 326 mg/dL — ABNORMAL HIGH (ref 65–99)
POTASSIUM: 4.2 mmol/L (ref 3.5–5.1)
SODIUM: 136 mmol/L (ref 135–145)

## 2018-03-11 LAB — GLUCOSE, CAPILLARY
GLUCOSE-CAPILLARY: 252 mg/dL — AB (ref 65–99)
Glucose-Capillary: 311 mg/dL — ABNORMAL HIGH (ref 65–99)

## 2018-03-11 MED ORDER — FUROSEMIDE 40 MG PO TABS: 40.0000 mg | ORAL_TABLET | Freq: Every day | ORAL | 3 refills | Status: DC

## 2018-03-11 NOTE — Progress Notes (Addendum)
Progress Note  Patient Name: Edward [name] Date of Encounter: 03/11/2018  Primary Cardiologist: [name], MD   Subjective   No recurrent chest comfort this morning. CTA of chest negative for PE.   Inpatient Medications    Scheduled Meds: . amLODipine  10 mg Oral Daily  . aspirin EC  81 mg Oral Daily  . clopidogrel  75 mg Oral Daily  . enoxaparin (LOVENOX) injection  40 mg Subcutaneous Q24H  . furosemide  40 mg Intravenous BID  . gabapentin  300 mg Oral BID  . insulin aspart  0-15 Units Subcutaneous TID WC  . insulin aspart  0-5 Units Subcutaneous QHS  . insulin detemir  20 Units Subcutaneous BID  . isosorbide mononitrate  15 mg Oral Daily  . lisinopril  20 mg Oral Daily  . pantoprazole  40 mg Oral Daily  . primidone  25 mg Oral BID  . propranolol  40 mg Oral TID  . rosuvastatin  20 mg Oral Daily  . sodium chloride flush  3 mL Intravenous Q12H   Continuous Infusions: . sodium chloride     PRN Meds: sodium chloride, acetaminophen, albuterol, clonazePAM, meclizine, morphine injection, nitroGLYCERIN, ondansetron (ZOFRAN) IV, sodium chloride flush   Vital Signs    Vitals:   03/10/18 1003 03/10/18 1205 03/10/18 1953 03/11/18 0449  BP: (!) 158/66 119/70 (!) 124/55 140/68  Pulse: (!) 57 (!) 58 64 65  Resp:  20 18 18   Temp:  (!) 97.3 F (36.3 C) 97.7 F (36.5 C) 97.6 F (36.4 C)  TempSrc:  Oral Oral Oral  SpO2: 100% 99% 99% 98%  Weight:    273 lb 12.8 oz (124.2 kg)  Height:        Intake/Output Summary (Last 24 hours) at 03/11/2018 0856 Last data filed at 03/11/2018 0458 Gross per 24 hour  Intake 840 ml  Output 550 ml  Net 290 ml   Filed Weights   03/09/18 0500 03/10/18 0355 03/11/18 0449  Weight: 274 lb 3.2 oz (124.4 kg) 275 lb 14.4 oz (125.1 kg) 273 lb 12.8 oz (124.2 kg)    Telemetry    SR with 1st degree AV block  - Personally Reviewed  ECG    N/A  Physical Exam   GEN: Obese male sitting in recliner, no acute distress.   Neck: No  JVD Cardiac: RRR, no murmurs, rubs, or gallops.  Respiratory: Clear to auscultation bilaterally. GI: Soft, nontender, non-distended  MS: Trace to 1 +  edema; No deformity. Neuro:  Nonfocal  Psych: Normal affect   Labs    Chemistry Recent Labs  Lab 03/07/18 0953  03/08/18 1228 03/09/18 0605 03/10/18 0504 03/11/18 0629  NA 140   < > 139 136 136 136  K 4.1  --  4.1 4.1 4.1 4.2  CL 100  --  104 100* 100* 100*  CO2 27  --  28 29 27 31   GLUCOSE 148*   < > 94 267* 353* 326*  BUN 11   < > 9 12 12 16   CREATININE 1.09  --  1.09 1.30* 1.20 1.47*  CALCIUM 9.0  --  8.6* 8.6* 8.5* 8.9  PROT 6.4  --  5.8*  --   --   --   ALBUMIN 3.7  --  3.0*  --   --   --   AST 18  --  19  --   --   --   ALT 17  --  17  --   --   --  ALKPHOS 100  --  79  --   --   --   BILITOT 0.5  --  0.5  --   --   --   GFRNONAA 66  --  >60 52* 57* 45*  GFRAA 76  --  >60 >60 >60 52*  ANIONGAP  --    < > 7 7 9 5    < > = values in this interval not displayed.     Hematology Recent Labs  Lab 03/08/18 1228  WBC 5.4  RBC 4.18*  HGB 11.9*  HCT 36.1*  MCV 86.4  MCH 28.5  MCHC 33.0  RDW 13.6  PLT 222    Cardiac Enzymes Recent Labs  Lab 03/08/18 1228 03/08/18 1746 03/09/18 0003 03/09/18 0605  TROPONINI <0.03 <0.03 <0.03 <0.03   No results for input(s): TROPIPOC in the last 168 hours.   BNP Recent Labs  Lab 03/08/18 1228  BNP 100.9*     DDimer  Recent Labs  Lab 03/10/18 1423  DDIMER 0.53*     Radiology    Ct Angio Chest Pe W Or Wo Contrast  Result Date: 03/10/2018 CLINICAL DATA:  77 year old male with elevated D-dimer. EXAM: CT ANGIOGRAPHY CHEST WITH CONTRAST TECHNIQUE: Multidetector CT imaging of the chest was performed using the standard protocol during bolus administration of intravenous contrast. Multiplanar CT image reconstructions and MIPs were obtained to evaluate the vascular anatomy. CONTRAST:  166mL ISOVUE-370 IOPAMIDOL (ISOVUE-370) INJECTION 76% COMPARISON:  Chest radiograph  dated 03/08/2018 FINDINGS: Cardiovascular: There is mild cardiomegaly. No pericardial effusion. Multi vessel coronary vascular calcification and CABG vascular clips noted. There is mild atherosclerotic calcification the thoracic aorta. No aneurysmal dilatation or dissection. The origins of the great vessels of the aortic arch appear patent as visualized. Evaluation of the pulmonary arteries is limited due to suboptimal opacification of the peripheral branches and streak artifact caused by patient's arms. No large or central pulmonary artery embolus identified. Mediastinum/Nodes: There is no hilar or mediastinal adenopathy. Esophagus and the thyroid gland are grossly unremarkable. No mediastinal fluid collection. Lungs/Pleura: Patchy area of ground-glass density in the lingula most likely atelectatic changes although pneumonia is not excluded. Clinical correlation is recommended. There is no consolidative changes. No pleural effusion or pneumothorax. 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 The central airways are patent. Upper Abdomen: Small stone in the neck of the gallbladder. The visualized upper abdomen is otherwise unremarkable. Musculoskeletal: There is median sternotomy wires. There degenerative changes of the spine with no acute osseous pathology. Review of the MIP images confirms the above findings. IMPRESSION: 1. No CT evidence of central or large pulmonary artery embolus. 2. Mild cardiomegaly and coronary vascular calcification. 3. Atelectatic changes versus less likely developing infiltrate the lingula. 4. Gallstones. Electronically Signed   By: Anner Crete M.D.   On: 03/10/2018 21:29    Cardiac Studies   LHC: 03/09/18  Difficult procedure from left radial approach due to difficulty with torque control. Would advise femoral approach in the future.  Eccentric 60% proximal LAD. Widely patent mid LAD stent. Antegrade flow to the apex.  Widely patent left main.  Totally occluded ostial circumflex to  proximal circumflex.  Proximal mid and distal RCA moderate disease with tandem 65 to 75% % distal segmental stenoses.  Patent saphenous vein graft to the second obtuse marginal which supplies the entire circumflex territory.  Widely patent LIMA to the distal segment of the LAD. Competitive flow is noted.  Low normal LV systolic function with EF 50 to 55%. Mildly elevated  end-diastolic pressure.  Continuous sharp left chest pain not felt to be ischemia related.  RECOMMENDATIONS:   Consider musculoskeletal or neuropathic pain as a source of the patient's presentation.  Continue aggressive risk factor modification for secondary prevention of ischemic events.  If No Local complications, could be discharged later today.  Patient Profile     77 y.o. 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   Assessment & Plan    1. Chest pain  - waxing and weaning chest pain. ECG and enzymes do not support an acute coronary syndrome, but he describes his pain as identical to what he experienced before LAD stent and before CABG. - CAth showed patent grafts. Dr Tamala Julian is suggesting musculoskeletal or neuropathic pain as a source of the patient's presentation. Neurology recommended (no formal consult) r/o PE and continue gabapentin at current dose. Minimally elevated d-dimer at 0.53. CTA of chest negative for PE.   2. Acute on chronic diastolic CHF/Chronic LE edema  - Iv lasix 40mg  BID started yesterday. Net I & O +392cc. Doubt accurate. However weight down 6 lb (279-->273lb). Seems improved chronic edema. He was getting lasix 80mg  qd at home. MD to decide discharge dose.   3. AKI - Scr of 1.47 today after contrast yesterday. BMET in 1 week.   Discharge medication and plan per MD.   For questions or updates, please contact [place] Please consult [email or web address] for contact info under Cardiology/STEMI.     SignedCrista Luria [place], PA  03/11/2018, 8:56 AM     The patient was seen, examined and discussed with Bhagat,Bhavinkumar PA-C and I agree with the above.   Weight down 2 lbs since restarting lasix 40 mg po BID yesterday. Crea up 1.2->1.47, possibly sec to contrast with CTA yesterday. We will hold lasix today and tomorrow and restart lasix 40 mg po daily on Tuesday.  No PE on chest CT, neurology recommended to continue Gabapentin, we will plan for a discharge today and follow up with Dr Tamala Julian.  Ena Dawley, MD 03/11/2018

## 2018-03-11 NOTE — Progress Notes (Signed)
Pt called out requesting pain med, pt says it is like his normal chest pain middle of chest dull, slight sob, no radiation, vss, ntg x1 given sl per prn order, paged md on call, ekg done , she will review ekg, bp down to 92/49, pain down to 4, no distress, pt bp 127/54, pt says feels better when nurse left room

## 2018-03-11 NOTE — Discharge Summary (Signed)
Discharge Summary    Patient ID: Edward [name],  MRN: [id number], DOB/AGE: [birth year] 77 y.o.  Admit date: 03/08/2018 Discharge date: 03/11/2018  Primary Care Provider: Roma Schanz R Primary Cardiologist: Sinclair Grooms, MD   Discharge Diagnoses    Principal Problem:   Coronary artery disease involving coronary bypass graft of native heart with angina pectoris  [facility]) Active Problems:   Essential hypertension   Diabetes mellitus ([place])   Chronic diastolic heart failure ([place])   Acute on chronic diastolic (congestive) heart failure ([place])    History of Present Illness     Edward [name] is a 77 y.o. male with past medical history of CAD (s/p CABG in 10/2016), chronic diastolic CHF, HTN, HLD, Type 2 DM, Stage 3 CKD, and OSA who presented to [facility] ED on 03/08/2018 for evaluation of chest pain.   He reported being in his usual state of health until the evening prior to his presentation when he developed new-onset chest discomfort. Reported the pain radiated into his left shoulder and he experienced associated dyspnea and diaphoresis. Was given SL NTG en route to the hospital with improvement in his symptoms.   While in the ED, labs showed K 4.1, Mg 1.6, Cr 1.09, Hgb 11.9, PLT 222, BNP 100.9, Trop <0.03, LDL 128. CXR with no acute findings. EKG sinus rhythm with 1st degree AV block, no STE/D, low voltage, no significant change from previous tracings.  He was given IV Lasix while in the ED with improvement in his respiratory status. He was admitted for further diuresis and management.    Hospital Course     Consultants: None  The following morning, he reported intermittent episodes of chest discomfort overnight and a repeat cardiac catheterization was recommended for definitive evaluation. Cyclic troponin values remained negative. His cath was performed by Dr. Tamala Julian later in the day and showed 60% proximal LAD stenosis, widely patent mid LAD stent, patent LM, totally  occluded ostial circumflex to proximal circumflex, moderate nonobstructive disease along RCA and patent SVG-OM2 and patent LIMA-dLAD. There was no identifiable culprit for his presentation and it was recommended to consider a MSK or neuropathic etiology as the source of his discomfort.   He continued to report chest discomfort following the procedure and was restarted on IV Lasix for continued diuresis. Neurology was contacted regarding treatment for his neuropathic pain and recommended continuing on Gabapentin and following up as an outpatient if symptoms persisted. It was recommended to rule-out a PE and his d-dimer was mildly elevated at 0.53, therefore a CTA was performed. This showed no evidence of a PE. Was noted to have mild cardiomegaly and atelectatic changes versus less likely developing infiltrate along the lingula.   On 5/12, he was examined by Dr. Meda Coffee and deemed stable for discharge. Creatinine had trended upwards following his CTA, therefore Lasix was held and it was recommended he restart Lasix 40mg  daily on 03/13/2018 with plans for a repeat BMET in 1 week. A staff message has been sent to arrange for Cardiology follow-up. He was discharged home in good condition.   _____________  Discharge Vitals Blood pressure (!) 127/54, pulse 64, temperature 97.6 F (36.4 C), temperature source Oral, resp. rate 18, height 5\' 8"  (1.727 m), weight 273 lb 12.8 oz (124.2 kg), SpO2 98 %.  Filed Weights   03/09/18 0500 03/10/18 0355 03/11/18 0449  Weight: 274 lb 3.2 oz (124.4 kg) 275 lb 14.4 oz (125.1 kg) 273 lb 12.8 oz (124.2 kg)  Labs & Radiologic Studies     CBC No results for input(s): WBC, NEUTROABS, HGB, HCT, MCV, PLT in the last 72 hours. Basic Metabolic Panel Recent Labs    03/10/18 0504 03/11/18 0629  NA 136 136  K 4.1 4.2  CL 100* 100*  CO2 27 31  GLUCOSE 353* 326*  BUN 12 16  CREATININE 1.20 1.47*  CALCIUM 8.5* 8.9   Liver Function Tests No results for input(s): AST,  ALT, ALKPHOS, BILITOT, PROT, ALBUMIN in the last 72 hours. No results for input(s): LIPASE, AMYLASE in the last 72 hours. Cardiac Enzymes Recent Labs    03/08/18 1746 03/09/18 0003 03/09/18 0605  TROPONINI <0.03 <0.03 <0.03   BNP Invalid input(s): POCBNP D-Dimer Recent Labs    03/10/18 1423  DDIMER 0.53*   Hemoglobin A1C No results for input(s): HGBA1C in the last 72 hours. Fasting Lipid Panel No results for input(s): CHOL, HDL, LDLCALC, TRIG, CHOLHDL, LDLDIRECT in the last 72 hours. Thyroid Function Tests No results for input(s): TSH, T4TOTAL, T3FREE, THYROIDAB in the last 72 hours.  Invalid input(s): FREET3  Dg Chest 2 View  Result Date: 03/08/2018 CLINICAL DATA:  Chest pain. EXAM: CHEST - 2 VIEW COMPARISON:  Radiographs of October 13, 2017. FINDINGS: Stable cardiomediastinal silhouette. Status post coronary artery bypass graft. No pneumothorax or pleural effusion is noted. Both lungs are clear. The visualized skeletal structures are unremarkable. IMPRESSION: No active cardiopulmonary disease. Electronically Signed   By: Marijo Conception, M.D.   On: 03/08/2018 13:21   Ct Angio Chest Pe W Or Wo Contrast  Result Date: 03/10/2018 CLINICAL DATA:  77 year old male with elevated D-dimer. EXAM: CT ANGIOGRAPHY CHEST WITH CONTRAST TECHNIQUE: Multidetector CT imaging of the chest was performed using the standard protocol during bolus administration of intravenous contrast. Multiplanar CT image reconstructions and MIPs were obtained to evaluate the vascular anatomy. CONTRAST:  159mL ISOVUE-370 IOPAMIDOL (ISOVUE-370) INJECTION 76% COMPARISON:  Chest radiograph dated 03/08/2018 FINDINGS: Cardiovascular: There is mild cardiomegaly. No pericardial effusion. Multi vessel coronary vascular calcification and CABG vascular clips noted. There is mild atherosclerotic calcification the thoracic aorta. No aneurysmal dilatation or dissection. The origins of the great vessels of the aortic arch appear  patent as visualized. Evaluation of the pulmonary arteries is limited due to suboptimal opacification of the peripheral branches and streak artifact caused by patient's arms. No large or central pulmonary artery embolus identified. Mediastinum/Nodes: There is no hilar or mediastinal adenopathy. Esophagus and the thyroid gland are grossly unremarkable. No mediastinal fluid collection. Lungs/Pleura: Patchy area of ground-glass density in the lingula most likely atelectatic changes although pneumonia is not excluded. Clinical correlation is recommended. There is no consolidative changes. No pleural effusion or pneumothorax. 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 The central airways are patent. Upper Abdomen: Small stone in the neck of the gallbladder. The visualized upper abdomen is otherwise unremarkable. Musculoskeletal: There is median sternotomy wires. There degenerative changes of the spine with no acute osseous pathology. Review of the MIP images confirms the above findings. IMPRESSION: 1. No CT evidence of central or large pulmonary artery embolus. 2. Mild cardiomegaly and coronary vascular calcification. 3. Atelectatic changes versus less likely developing infiltrate the lingula. 4. Gallstones. Electronically Signed   By: Anner Crete M.D.   On: 03/10/2018 21:29     Diagnostic Studies/Procedures     Cardiac Catheterization: 03/09/2018  Difficult procedure from left radial approach due to difficulty with torque control.  Would advise femoral approach in the future.  Eccentric 60% proximal LAD.  Widely patent mid LAD stent.  Antegrade flow to the apex.  Widely patent left main.  Totally occluded ostial circumflex to proximal circumflex.  Proximal mid and distal RCA moderate disease with tandem 65 to 75% % distal segmental stenoses.  Patent saphenous vein graft to the second obtuse marginal which supplies the entire circumflex territory.  Widely patent LIMA to the distal segment of the LAD.   Competitive flow is noted.  Low normal LV systolic function with EF 50 to 55%.  Mildly elevated end-diastolic pressure.  Continuous sharp left chest pain not felt to be ischemia related.  RECOMMENDATIONS:   Consider musculoskeletal or neuropathic pain as a source of the patient's presentation.  Continue aggressive risk factor modification for secondary prevention of ischemic events.  If No Local complications, could be discharged later today.    Disposition   Pt is being discharged home today in good condition.  Follow-up Plans & Appointments    Follow-up Information    Belva Crome, MD Follow up.   Specialty:  Cardiology Why:  The office will contact you within 2-3 business days to arrange Cardiology follow-up. If you do not hear from them within this time frame, please call the number provided.  Contact information: [address] [place] [address] [phone]        Carollee Herter, Kendrick Fries R, DO Follow up.   Specialty:  Family Medicine Why:  You will need to have repeat blood work in 1 week (Basic Metabolic Panel to check kidney function).  Contact information: [address] [place] [address] [phone]          Discharge Instructions    Diet - low sodium heart healthy   Complete by:  As directed       Discharge Medications     Medication List    TAKE these medications   acetaminophen 500 MG tablet Commonly known as:  TYLENOL Take 2 tablets (1,000 mg total) by mouth every 6 (six) hours as needed.   albuterol 108 (90 Base) MCG/ACT inhaler Commonly known as:  VENTOLIN HFA inhale 2 puffs by mouth every 6 hours if needed for wheezing or shortness of breath   amLODipine 10 MG tablet Commonly known as:  NORVASC Take 10 mg by mouth daily.   aspirin EC 81 MG tablet Take 81 mg by mouth daily.   bismuth subsalicylate 737 TG/62IR suspension Commonly known as:  PEPTO BISMOL Take 30 mLs by mouth as needed for  indigestion.   Ciclopirox 1 % shampoo Apply 1 application topically See admin instructions. Massage in scalp three times weekly and leave on for 10 min. Then rinse.   clonazePAM 1 MG tablet Commonly known as:  KLONOPIN Take by mouth at bedtime as needed for anxiety.   clopidogrel 75 MG tablet Commonly known as:  PLAVIX Take 1 tablet (75 mg total) by mouth daily.   diclofenac sodium 1 % Gel Commonly known as:  VOLTAREN Apply 2 g topically 4 (four) times daily as needed (for pain).   furosemide 40 MG tablet Commonly known as:  LASIX Take 1 tablet (40 mg total) by mouth daily. What changed:    medication strength  how much to take Notes to patient:  DO NOT RESTART UNTIL 03/13/2018!   gabapentin 300 MG capsule Commonly known as:  NEURONTIN take 1 capsule by mouth at bedtime for 1 week then INCREASE TO 1 CAP TWICE DAILY   HUMULIN R U-500 (CONCENTRATED) [place] Inject  20 Units into the skin 3 (three) times daily with meals.   insulin NPH Human 100 UNIT/ML injection Commonly known as:  HUMULIN N Inject under skin 30 units 2x a day - ReliOn What changed:    how much to take  how to take this  when to take this  additional instructions   Insulin Syringe-Needle U-100 31G X 5/16" 1 ML Misc Commonly known as:  BD INSULIN SYRINGE U/F Use 4 times per day to inject insulin.   isosorbide mononitrate 30 MG 24 hr tablet Commonly known as:  IMDUR Take 15 mg by mouth daily.   lisinopril 20 MG tablet Commonly known as:  PRINIVIL,ZESTRIL Take 20 mg by mouth daily.   meclizine 25 MG tablet Commonly known as:  ANTIVERT Take 1 tablet (25 mg total) by mouth 2 (two) times daily as needed for dizziness.   nitroGLYCERIN 0.4 MG SL tablet Commonly known as:  NITROSTAT Place 1 tablet (0.4 mg total) under the tongue every 5 (five) minutes as needed for chest pain. Max: 3 doses per episode.   pantoprazole 40 MG tablet Commonly known as:  PROTONIX Take 40 mg by mouth daily.   potassium  chloride SA 20 MEQ tablet Commonly known as:  K-DUR,KLOR-CON Take 1.5 tablets (30 mEq total) by mouth daily.   primidone 50 MG tablet Commonly known as:  MYSOLINE Take 25 mg by mouth 2 (two) times daily.   propranolol 40 MG tablet Commonly known as:  INDERAL Take 1 tablet (40 mg total) by mouth 3 (three) times daily.   psyllium 58.6 % packet Commonly known as:  METAMUCIL Take 1 packet by mouth daily as needed (constipation).   REFRESH OP Place 1 drop into both eyes 3 (three) times daily as needed (for dry eyes). Reported on 12/25/2015   rosuvastatin 20 MG tablet Commonly known as:  CRESTOR Take 1 tablet (20 mg total) by mouth daily.          Allergies Allergies  Allergen Reactions  . Pneumococcal Vaccines Anaphylaxis     Outstanding Labs/Studies   BMET in 1 week  Duration of Discharge Encounter   Greater than 30 minutes including physician time.  Signed, Erma Heritage, PA-C 03/11/2018, 12:35 PM

## 2018-03-11 NOTE — Discharge Instructions (Signed)

## 2018-03-11 NOTE — Progress Notes (Signed)
Pt given AVS and dc instructions, reinforced starting lasix 40mg  only daily not twice a day, pt has appt PCP appt Tuesday and aware cardiology will call for appt. All questions entertained, iv dc with tip intact, denies cp or sob

## 2018-03-12 ENCOUNTER — Telehealth: Payer: Self-pay | Admitting: *Deleted

## 2018-03-12 NOTE — Telephone Encounter (Signed)
Patient ID: Edward [name],  MRN: [id number], DOB/AGE: 1941/05/14 77 y.o.  Admit date: 03/08/2018 Discharge date: 03/11/2018  Primary Care Provider: Roma Schanz R Primary Cardiologist: Sinclair Grooms, MD   Discharge Diagnoses    Principal Problem:   Coronary artery disease involving coronary bypass graft of native heart with angina pectoris [facility]) Active Problems:   Essential hypertension   Diabetes mellitus ([place])   Chronic diastolic heart failure ([place])   Acute on chronic diastolic (congestive) heart failure [facility])    Transition Care Management Follow-up Telephone Call   How have you been since you were released from the hospital? "I feel okay"   Do you understand why you were in the hospital? yes   Do you understand the discharge instructions? yes   Where were you discharged to? Home with brother   Items Reviewed:  Medications reviewed: Pt states he wants to wait to go over with Dr.Lowne tomorrow  Allergies reviewed: yes  Dietary changes reviewed: yes  Referrals reviewed: yes   Functional Questionnaire:   Activities of Daily Living (ADLs):   He states they are independent in the following: ambulation, bathing and hygiene, feeding, continence, grooming, toileting and dressing States they require assistance with the following: na   Any transportation issues/concerns?: no   Any patient concerns? no   Confirmed importance and date/time of follow-up visits scheduled yes Provider Appointment booked with  Dr.Lowne 03/13/18 @10  Confirmed with patient if condition begins to worsen call PCP or go to the ER.  Patient was given the office number and encouraged to call back with question or concerns.  : yes

## 2018-03-13 ENCOUNTER — Ambulatory Visit (INDEPENDENT_AMBULATORY_CARE_PROVIDER_SITE_OTHER): Payer: Medicare Other | Admitting: Family Medicine

## 2018-03-13 ENCOUNTER — Encounter: Payer: Self-pay | Admitting: Family Medicine

## 2018-03-13 ENCOUNTER — Telehealth: Payer: Self-pay

## 2018-03-13 VITALS — BP 130/86 | HR 62 | Temp 97.8°F | Resp 16 | Ht 68.0 in | Wt 274.4 lb

## 2018-03-13 DIAGNOSIS — N189 Chronic kidney disease, unspecified: Secondary | ICD-10-CM

## 2018-03-13 DIAGNOSIS — I10 Essential (primary) hypertension: Secondary | ICD-10-CM | POA: Diagnosis not present

## 2018-03-13 DIAGNOSIS — E1151 Type 2 diabetes mellitus with diabetic peripheral angiopathy without gangrene: Secondary | ICD-10-CM | POA: Diagnosis not present

## 2018-03-13 DIAGNOSIS — IMO0002 Reserved for concepts with insufficient information to code with codable children: Secondary | ICD-10-CM

## 2018-03-13 DIAGNOSIS — K219 Gastro-esophageal reflux disease without esophagitis: Secondary | ICD-10-CM | POA: Diagnosis not present

## 2018-03-13 DIAGNOSIS — I25709 Atherosclerosis of coronary artery bypass graft(s), unspecified, with unspecified angina pectoris: Secondary | ICD-10-CM | POA: Diagnosis not present

## 2018-03-13 DIAGNOSIS — E1169 Type 2 diabetes mellitus with other specified complication: Secondary | ICD-10-CM | POA: Diagnosis not present

## 2018-03-13 DIAGNOSIS — G250 Essential tremor: Secondary | ICD-10-CM

## 2018-03-13 DIAGNOSIS — N183 Chronic kidney disease, stage 3 unspecified: Secondary | ICD-10-CM

## 2018-03-13 DIAGNOSIS — I5033 Acute on chronic diastolic (congestive) heart failure: Secondary | ICD-10-CM

## 2018-03-13 DIAGNOSIS — E1165 Type 2 diabetes mellitus with hyperglycemia: Secondary | ICD-10-CM | POA: Diagnosis not present

## 2018-03-13 DIAGNOSIS — E785 Hyperlipidemia, unspecified: Secondary | ICD-10-CM

## 2018-03-13 DIAGNOSIS — F419 Anxiety disorder, unspecified: Secondary | ICD-10-CM | POA: Diagnosis not present

## 2018-03-13 LAB — COMPREHENSIVE METABOLIC PANEL
ALT: 18 U/L (ref 0–53)
AST: 19 U/L (ref 0–37)
Albumin: 3.9 g/dL (ref 3.5–5.2)
Alkaline Phosphatase: 100 U/L (ref 39–117)
BILIRUBIN TOTAL: 0.4 mg/dL (ref 0.2–1.2)
BUN: 12 mg/dL (ref 6–23)
CHLORIDE: 104 meq/L (ref 96–112)
CO2: 28 meq/L (ref 19–32)
Calcium: 9.1 mg/dL (ref 8.4–10.5)
Creatinine, Ser: 1.02 mg/dL (ref 0.40–1.50)
GFR: 91.37 mL/min (ref >60.00)
Glucose, Bld: 186 mg/dL — ABNORMAL HIGH (ref 70–99)
Potassium: 4.1 mEq/L (ref 3.5–5.1)
SODIUM: 144 meq/L (ref 135–145)
TOTAL PROTEIN: 6.9 g/dL (ref 6.0–8.3)

## 2018-03-13 LAB — HEMOGLOBIN A1C: HEMOGLOBIN A1C: 10.2 % — AB (ref 4.6–6.5)

## 2018-03-13 MED ORDER — CLOPIDOGREL BISULFATE 75 MG PO TABS: 75.0000 mg | ORAL_TABLET | Freq: Every day | ORAL | 3 refills | Status: DC

## 2018-03-13 MED ORDER — FUROSEMIDE 40 MG PO TABS: 40.0000 mg | ORAL_TABLET | Freq: Every day | ORAL | 3 refills | Status: DC

## 2018-03-13 MED ORDER — AMLODIPINE BESYLATE 10 MG PO TABS: 10.0000 mg | ORAL_TABLET | Freq: Every day | ORAL | 1 refills | Status: DC

## 2018-03-13 MED ORDER — PROPRANOLOL HCL 40 MG PO TABS: 40.0000 mg | ORAL_TABLET | Freq: Three times a day (TID) | ORAL | 3 refills | Status: DC

## 2018-03-13 MED ORDER — PANTOPRAZOLE SODIUM 40 MG PO TBEC: 40.0000 mg | DELAYED_RELEASE_TABLET | Freq: Every day | ORAL | 3 refills | Status: DC

## 2018-03-13 MED ORDER — ROSUVASTATIN CALCIUM 20 MG PO TABS: 20.0000 mg | ORAL_TABLET | Freq: Every day | ORAL | 3 refills | Status: DC

## 2018-03-13 MED ORDER — INSULIN REGULAR HUMAN (CONC) 500 UNIT/ML ~~LOC~~ SOLN: 20.0000 [IU] | Freq: Three times a day (TID) | SUBCUTANEOUS | 0 refills | Status: DC

## 2018-03-13 MED ORDER — GABAPENTIN 300 MG PO CAPS: ORAL_CAPSULE | ORAL | 11 refills | Status: DC

## 2018-03-13 MED ORDER — CLONAZEPAM 1 MG PO TABS: 1.0000 mg | ORAL_TABLET | Freq: Every day | ORAL | 1 refills | Status: DC

## 2018-03-13 MED FILL — Heparin Sod (Porcine)-NaCl IV Soln 1000 Unit/500ML-0.9%: INTRAVENOUS | Qty: 1000 | Status: AC

## 2018-03-13 NOTE — Telephone Encounter (Signed)
PA initiated via Covermymeds; KEY: [id number]. Received real-time PA approval.   Effective 12/13/2017 through 03/13/2019.

## 2018-03-13 NOTE — Assessment & Plan Note (Signed)
Well controlled, no changes to meds. Encouraged heart healthy diet such as the DASH diet and exercise as tolerated.  °

## 2018-03-13 NOTE — Progress Notes (Signed)
Patient ID: Edward [name], male    DOB: 11/09/1940  Age: 77 y.o. MRN: [id number]    Subjective:  Subjective  HPI Edward [name] presents for hosp f/u for chest pain--- CAD involving coronary bypass graft and angina , chf.  He was given SL ntg in ambulance and felt better.  ekg showed sinus rhythm with 1 st degree AV block.  No sig change from previous He was given IV Lasix in [place] with improvement of symptoms.   Review of Systems  Constitutional: Negative for appetite change, diaphoresis, fatigue and unexpected weight change.  Eyes: Negative for pain, redness and visual disturbance.  Respiratory: Negative for cough, chest tightness, shortness of breath and wheezing.   Cardiovascular: Negative for chest pain, palpitations and leg swelling.  Endocrine: Negative for cold intolerance, heat intolerance, polydipsia, polyphagia and polyuria.  Genitourinary: Negative for difficulty urinating, dysuria and frequency.  Neurological: Negative for dizziness, light-headedness, numbness and headaches.    History Past Medical History:  Diagnosis Date  . Adenomatous colon polyp 04/2011  . CAD (coronary artery disease)    a. 01/2015 DES to LAD  b. 12/2015: [place] 85% oLCx lesion--> rx therapy.  . Chronic diastolic CHF (congestive heart failure) ([place])    a. 05/2017 Echo: EF 60-65%, Gr1 DD, no rwma, mildly dil LA, nl RV fxn, mild TR.  . CKD (chronic kidney disease), stage II    a. probable CKD II-III with baseline CR 1.1-1.3.  . DM type 2 (diabetes mellitus, type 2), insulin dependent 01/29/2014   fasting cbg 50-120 with new regimen  . Dyslipidemia, goal LDL below 70 01/29/2014  . Enlarged prostate   . Essential tremor    a. on proprnolol  . GERD (gastroesophageal reflux disease)   . Hypertension   . Internal hemorrhoid   . Sleep apnea    does not use cpap (06/04/2015)  . TIA (transient ischemic attack) 2002    He has a past surgical history that includes Total knee arthroplasty (Bilateral); Lumbar disc  surgery; Cataract extraction (Bilateral); Joint replacement; Back surgery; Cataract extraction, bilateral (Bilateral); Shoulder arthroscopy (Right, 07/30/2014); left heart catheterization with coronary angiogram (N/A, 01/28/2014); left heart catheterization with coronary angiogram (N/A, 01/30/2015); Cardiac catheterization (01/28/14); Coronary angioplasty with stent (01/30/2015); Cardiac catheterization (N/A, 12/21/2015); Cardiac catheterization (N/A, 11/02/2016); Coronary artery bypass graft (N/A, 11/09/2016); TEE without cardioversion (N/A, 11/09/2016); Endoharvest vein of greater saphenous vein (Left, 11/09/2016); and LEFT HEART CATH AND CORS/GRAFTS ANGIOGRAPHY (N/A, 03/09/2018).   His family history includes Alzheimer's disease in his sister; Asthma in his brother; CAD in his brother and sister; Diabetes in his other; Prostate cancer in his brother.He reports that he has never smoked. He has never used smokeless tobacco. He reports that he does not drink alcohol or use drugs.  Current Outpatient Medications on File Prior to Visit  Medication Sig Dispense Refill  . acetaminophen (TYLENOL) 500 MG tablet Take 2 tablets (1,000 mg total) by mouth every 6 (six) hours as needed. 30 tablet 0  . albuterol (VENTOLIN HFA) 108 (90 Base) MCG/ACT inhaler inhale 2 puffs by mouth every 6 hours if needed for wheezing or shortness of breath 18 g 1  . aspirin EC 81 MG tablet Take 81 mg by mouth daily.    Marland Kitchen bismuth subsalicylate (PEPTO BISMOL) 262 MG/15ML suspension Take 30 mLs by mouth as needed for indigestion.    . Ciclopirox 1 % shampoo Apply 1 application topically See admin instructions. Massage in scalp three times weekly and leave on for 10  min. Then rinse.    . diclofenac sodium (VOLTAREN) 1 % GEL Apply 2 g topically 4 (four) times daily as needed (for pain).     . insulin NPH Human (HUMULIN N) 100 UNIT/ML injection Inject under skin 30 units 2x a day - ReliOn (Patient taking differently: Inject 40 Units into the skin 2  (two) times daily before a meal. Inject under skin 30 units 2x a day - ReliOn) 20 mL 5  . Insulin Syringe-Needle U-100 (BD INSULIN SYRINGE U/F) 31G X 5/16" 1 ML MISC Use 4 times per day to inject insulin. 300 each 5  . isosorbide mononitrate (IMDUR) 30 MG 24 hr tablet Take 15 mg by mouth daily.  0  . lisinopril (PRINIVIL,ZESTRIL) 20 MG tablet Take 20 mg by mouth daily.    . meclizine (ANTIVERT) 25 MG tablet Take 1 tablet (25 mg total) by mouth 2 (two) times daily as needed for dizziness. 30 tablet 1  . nitroGLYCERIN (NITROSTAT) 0.4 MG SL tablet Place 1 tablet (0.4 mg total) under the tongue every 5 (five) minutes as needed for chest pain. Max: 3 doses per episode. 25 tablet 3  . Polyvinyl Alcohol-Povidone (REFRESH OP) Place 1 drop into both eyes 3 (three) times daily as needed (for dry eyes). Reported on 12/25/2015    . potassium chloride SA (K-DUR,KLOR-CON) 20 MEQ tablet Take 1.5 tablets (30 mEq total) by mouth daily. 45 tablet 6  . primidone (MYSOLINE) 50 MG tablet Take 25 mg by mouth 2 (two) times daily.    . psyllium (METAMUCIL) 58.6 % packet Take 1 packet by mouth daily as needed (constipation).     No current facility-administered medications on file prior to visit.      Objective:  Objective  Physical Exam  Constitutional: He is oriented to person, place, and time. Vital signs are normal. He appears well-developed and well-nourished. He is sleeping.  HENT:  Head: Normocephalic and atraumatic.  Mouth/Throat: Oropharynx is clear and moist.  Eyes: Pupils are equal, round, and reactive to light. EOM are normal.  Neck: Normal range of motion. Neck supple. No thyromegaly present.  Cardiovascular: Normal rate and regular rhythm.  No murmur heard. Pulmonary/Chest: Effort normal and breath sounds normal. No respiratory distress. He has no wheezes. He has no rales. He exhibits no tenderness.  Musculoskeletal: He exhibits no edema or tenderness.  Neurological: He is alert and oriented to  person, place, and time.  Skin: Skin is warm and dry.  Psychiatric: He has a normal mood and affect. His behavior is normal. Judgment and thought content normal.  Nursing note and vitals reviewed.  BP 130/86 (BP Location: Right Arm, Cuff Size: Normal)   Pulse 62   Temp 97.8 F (36.6 C) (Oral)   Resp 16   Ht 5\' 8"  (1.727 m)   Wt 274 lb 6.4 oz (124.5 kg)   SpO2 98%   BMI 41.72 kg/m  Wt Readings from Last 3 Encounters:  03/13/18 274 lb 6.4 oz (124.5 kg)  03/11/18 273 lb 12.8 oz (124.2 kg)  01/29/18 279 lb (126.6 kg)     Lab Results  Component Value Date   WBC 5.4 03/08/2018   HGB 11.9 (L) 03/08/2018   HCT 36.1 (L) 03/08/2018   PLT 222 03/08/2018   GLUCOSE 186 (H) 03/13/2018   CHOL 183 03/07/2018   TRIG 87 03/07/2018   HDL 38 (L) 03/07/2018   LDLDIRECT 144.0 07/08/2015   LDLCALC 128 (H) 03/07/2018   ALT 18 03/13/2018   AST  19 03/13/2018   NA 144 03/13/2018   K 4.1 03/13/2018   CL 104 03/13/2018   CREATININE 1.02 03/13/2018   BUN 12 03/13/2018   CO2 28 03/13/2018   TSH 1.362 06/06/2017   PSA 0.74 02/11/2014   INR 1.10 03/08/2018   HGBA1C 10.2 (H) 03/13/2018   MICROALBUR 62.1 (H) 01/02/2017    Dg Chest 2 View  Result Date: 03/08/2018 CLINICAL DATA:  Chest pain. EXAM: CHEST - 2 VIEW COMPARISON:  Radiographs of October 13, 2017. FINDINGS: Stable cardiomediastinal silhouette. Status post coronary artery bypass graft. No pneumothorax or pleural effusion is noted. Both lungs are clear. The visualized skeletal structures are unremarkable. IMPRESSION: No active cardiopulmonary disease. Electronically Signed   By: Marijo Conception, M.D.   On: 03/08/2018 13:21     Assessment & Plan:  Plan  I have changed Edward [name]'s (Insulin Regular Human (HUMULIN R U-500, CONCENTRATED, [place])) to insulin regular human CONCENTRATED (HUMULIN R) 500 UNIT/ML injection. I have also changed his pantoprazole, amLODipine, and clonazePAM. I am also having him maintain his aspirin EC, Polyvinyl  Alcohol-Povidone (REFRESH OP), diclofenac sodium, acetaminophen, psyllium, potassium chloride SA, meclizine, albuterol, isosorbide mononitrate, insulin NPH Human, nitroGLYCERIN, Insulin Syringe-Needle U-100, primidone, Ciclopirox, lisinopril, bismuth subsalicylate, propranolol, clopidogrel, furosemide, gabapentin, and rosuvastatin.  Meds ordered this encounter  Medications  . pantoprazole (PROTONIX) 40 MG tablet    Sig: Take 1 tablet (40 mg total) by mouth daily.    Dispense:  90 tablet    Refill:  3  . propranolol (INDERAL) 40 MG tablet    Sig: Take 1 tablet (40 mg total) by mouth 3 (three) times daily.    Dispense:  270 tablet    Refill:  3  . clopidogrel (PLAVIX) 75 MG tablet    Sig: Take 1 tablet (75 mg total) by mouth daily.    Dispense:  90 tablet    Refill:  3  . furosemide (LASIX) 40 MG tablet    Sig: Take 1 tablet (40 mg total) by mouth daily.    Dispense:  90 tablet    Refill:  3  . gabapentin (NEURONTIN) 300 MG capsule    Sig: take 1 capsule by mouth at bedtime for 1 week then INCREASE TO 1 CAP TWICE DAILY    Dispense:  60 capsule    Refill:  11  . amLODipine (NORVASC) 10 MG tablet    Sig: Take 1 tablet (10 mg total) by mouth daily.    Dispense:  90 tablet    Refill:  1  . clonazePAM (KLONOPIN) 1 MG tablet    Sig: Take 1 tablet (1 mg total) by mouth at bedtime.    Dispense:  30 tablet    Refill:  1  . rosuvastatin (CRESTOR) 20 MG tablet    Sig: Take 1 tablet (20 mg total) by mouth daily.    Dispense:  90 tablet    Refill:  3    D/c pravastatin  . insulin regular human CONCENTRATED (HUMULIN R) 500 UNIT/ML injection    Sig: Inject 0.04 mLs (20 Units total) into the skin 3 (three) times daily with meals.    Dispense:  20 mL    Refill:  0    Problem List Items Addressed This Visit      Unprioritized   Acute on chronic diastolic (congestive) heart failure ([place])    Per cardiology      Relevant Medications   propranolol (INDERAL) 40 MG tablet   furosemide  (LASIX)  40 MG tablet   amLODipine (NORVASC) 10 MG tablet   rosuvastatin (CRESTOR) 20 MG tablet   Anxiety   Relevant Medications   clonazePAM (KLONOPIN) 1 MG tablet   Chronic kidney disease, stage III (moderate) (HCC)    Check labs today      Coronary artery disease involving coronary bypass graft of native heart with angina pectoris (HCC) (Chronic)    Per cardiology      Relevant Medications   propranolol (INDERAL) 40 MG tablet   clopidogrel (PLAVIX) 75 MG tablet   furosemide (LASIX) 40 MG tablet   amLODipine (NORVASC) 10 MG tablet   rosuvastatin (CRESTOR) 20 MG tablet   Essential hypertension (Chronic)    Well controlled, no changes to meds. Encouraged heart healthy diet such as the DASH diet and exercise as tolerated.       Relevant Medications   propranolol (INDERAL) 40 MG tablet   furosemide (LASIX) 40 MG tablet   amLODipine (NORVASC) 10 MG tablet   rosuvastatin (CRESTOR) 20 MG tablet   Essential tremor    On propranolol  stable      Relevant Medications   propranolol (INDERAL) 40 MG tablet   gabapentin (NEURONTIN) 300 MG capsule   GERD (gastroesophageal reflux disease) - Primary   Relevant Medications   pantoprazole (PROTONIX) 40 MG tablet    Other Visit Diagnoses    DM (diabetes mellitus) type II uncontrolled, periph vascular disorder (HCC)       Relevant Medications   propranolol (INDERAL) 40 MG tablet   furosemide (LASIX) 40 MG tablet   amLODipine (NORVASC) 10 MG tablet   rosuvastatin (CRESTOR) 20 MG tablet   insulin regular human CONCENTRATED (HUMULIN R) 500 UNIT/ML injection   Other Relevant Orders   Hemoglobin A1c (Completed)   Comprehensive metabolic panel (Completed)   Chronic kidney disease, unspecified CKD stage       Relevant Orders   Comprehensive metabolic panel (Completed)   Hyperlipidemia associated with type 2 diabetes mellitus (HCC)       Relevant Medications   propranolol (INDERAL) 40 MG tablet   furosemide (LASIX) 40 MG tablet    amLODipine (NORVASC) 10 MG tablet   rosuvastatin (CRESTOR) 20 MG tablet   insulin regular human CONCENTRATED (HUMULIN R) 500 UNIT/ML injection      Follow-up: Return in about 3 months (around 06/13/2018), or if symptoms worsen or fail to improve, for hypertension, hyperlipidemia, diabetes II.  Ann Held, DO

## 2018-03-13 NOTE — Assessment & Plan Note (Signed)
Check labs today.

## 2018-03-13 NOTE — Assessment & Plan Note (Signed)
Per cardiology 

## 2018-03-13 NOTE — Assessment & Plan Note (Signed)
On propranolol °stable °

## 2018-03-13 NOTE — Patient Instructions (Signed)
Chronic Kidney Disease, Adult Chronic kidney disease (CKD) occurs when the kidneys become damaged slowly over a long period of time. The kidneys are a pair of organs that do many important jobs in the body, including:  Removing waste and extra fluid from the blood to make urine.  Making hormones that maintain the amount of fluid in tissues and blood vessels.  Maintaining the right amount of fluids and chemicals in the body.  A small amount of kidney damage may not cause problems, but a large amount of damage may make it hard or impossible for the kidneys to work the way they should. If steps are not taken to slow down kidney damage or to stop it from getting worse, the kidneys may stop working permanently (end-stage renal disease or ESRD). Most of the time, CKD does not go away, but it can often be controlled. People who have CKD are usually able to live normal lives. What are the causes? The most common causes of this condition are diabetes and high blood pressure (hypertension). Other causes include:  Heart and blood vessel (cardiovascular) disease.  Kidney diseases, such as: ? Glomerulonephritis. ? Interstitial nephritis. ? Polycystic kidney disease. ? Renal vascular disease.  Diseases that affect the immune system.  Genetic diseases.  Medicines that damage the kidneys, such as anti-inflammatory medicines.  Being around or being in contact with poisonous (toxic) substances.  A kidney or urinary infection that occurs again and again (recurs).  Vasculitis. This is swelling or inflammation of the blood vessels.  A problem with urine flow that may be caused by: ? Cancer. ? Having kidney stones more than one time. ? An enlarged prostate, in males.  What increases the risk? You are more likely to develop this condition if you:  Are older than age 60.  Are male.  Are African-American, Hispanic, Asian, Pacific Islander, or American Indian.  Are a current or former  smoker.  Are obese.  Have a family history of kidney disease or failure.  Often take medicines that are damaging to the kidneys.  What are the signs or symptoms? Symptoms of this condition include:  Swelling (edema) of the face, legs, ankles, or feet.  Tiredness (lethargy) and having less energy.  Nausea or vomiting.  Confusion or trouble concentrating.  Problems with urination, such as: ? Painful or burning feeling during urination. ? Decreased urine production. ? Frequent urination, especially at night. ? Bloody urine.  Muscle twitches and cramps, especially in the legs.  Shortness of breath.  Weakness.  Loss of appetite.  Metallic taste in the mouth.  Trouble sleeping.  Dry, itchy skin.  A low blood count (anemia).  Pale lining of the eyelids and surface of the eye (conjunctiva).  Symptoms develop slowly and may not be obvious until the kidney damage becomes severe. It is possible to have kidney disease for years without having any symptoms. How is this diagnosed? This condition may be diagnosed based on:  Blood tests.  Urine tests.  Imaging tests, such as an ultrasound or CT scan.  A test in which a sample of tissue is removed from the kidneys to be examined under a microscope (kidney biopsy).  These test results will help your health care provider determine how serious the CKD is. How is this treated? There is no cure for most cases of this condition, but treatment usually relieves symptoms and prevents or slows the progression of the disease. Treatment may include:  Making diet changes, which may require you to   avoid alcohol, salty foods (sodium), and foods that are high in potassium, calcium, and protein.  Medicines: ? To lower blood pressure. ? To control blood glucose. ? To relieve anemia. ? To relieve swelling. ? To protect your bones. ? To improve the balance of electrolytes in your blood.  Removing toxic waste from the body through  types of dialysis, if the kidneys can no longer do their job (kidney failure).  Managing any other conditions that are causing your CKD or making it worse.  Follow these instructions at home: Medicines  Take over-the-counter and prescription medicines only as told by your health care provider. The dose of some medicines that you take may need to be adjusted.  Do not take any new medicines unless approved by your health care provider. Many medicines can worsen your kidney damage.  Do not take any vitamin and mineral supplements unless approved by your health care provider. Many nutritional supplements can worsen your kidney damage. General instructions  Follow your prescribed diet as told by your health care provider.  Do not use any products that contain nicotine or tobacco, such as cigarettes and e-cigarettes. If you need help quitting, ask your health care provider.  Monitor and track your blood pressure at home. Report changes in your blood pressure as told by your health care provider.  If you are being treated for diabetes, monitor and track your blood sugar (blood glucose) levels as told by your health care provider.  Maintain a healthy weight. If you need help with this, ask your health care provider.  Start or continue an exercise plan. Exercise at least 30 minutes a day, 5 days a week.  Keep your immunizations up to date as told by your health care provider.  Keep all follow-up visits as told by your health care provider. This is important. Where to find more information:  American Association of Kidney Patients: www.aakp.org  National Kidney Foundation: www.kidney.org  American Kidney Fund: www.akfinc.org  [employer] [facility]: www.lifeoptions.org and www.kidneyschool.org Contact a health care provider if:  Your symptoms get worse.  You develop new symptoms. Get help right away if:  You develop symptoms of ESRD, which  include: ? Headaches. ? Numbness in the hands or feet. ? Easy bruising. ? Frequent hiccups. ? Chest pain. ? Shortness of breath. ? Lack of menstruation, in women.  You have a fever.  You have decreased urine production.  You have pain or bleeding when you urinate. Summary  Chronic kidney disease (CKD) occurs when the kidneys become damaged slowly over a long period of time.  The most common causes of this condition are diabetes and high blood pressure (hypertension).  There is no cure for most cases of this condition, but treatment usually relieves symptoms and prevents or slows the progression of the disease. Treatment may include a combination of medicines and lifestyle changes. This information is not intended to replace advice given to you by your health care provider. Make sure you discuss any questions you have with your health care provider. Document Released: 07/26/2008 Document Revised: 11/24/2016 Document Reviewed: 11/24/2016 Elsevier Interactive Patient Education  2018 Elsevier Inc.  

## 2018-03-14 ENCOUNTER — Telehealth: Payer: Self-pay | Admitting: *Deleted

## 2018-03-14 NOTE — Telephone Encounter (Signed)
That is fine ---- maybe gwen or someone can call [facility] ---  Lilly rep and we can get samples for him maybe

## 2018-03-14 NOTE — Telephone Encounter (Signed)
The closest thing I see was the Novolin R.  Do you want to use the same directions?  There were a couple other fast acting.  Novolin R may be the cheapest.

## 2018-03-14 NOTE — Telephone Encounter (Signed)
Pharmacy called about the Humulin R 500u/ml and if you want to double check if you want this for the patient?  This is the concentrated kind.

## 2018-03-14 NOTE — Telephone Encounter (Signed)
He needs to get the same kind he was using--- which ever that was

## 2018-03-15 MED ORDER — INSULIN REGULAR HUMAN 100 UNIT/ML IJ SOLN: 20.0000 [IU] | Freq: Three times a day (TID) | INTRAMUSCULAR | 1 refills | Status: DC

## 2018-03-15 NOTE — Telephone Encounter (Signed)
Rx for Novolin R sent in.

## 2018-03-15 NOTE — Addendum Note (Signed)
Addended by: Kem Boroughs D on: 03/15/2018 03:46 PM   Modules accepted: Orders

## 2018-03-19 ENCOUNTER — Telehealth: Payer: Self-pay | Admitting: Interventional Cardiology

## 2018-03-19 NOTE — Telephone Encounter (Signed)
New Message:        Pt is calling to see if we can get his recent lab work done at his pcp Dr. Reece Leader office he had done last week so he doesn't have to come here to get it done.

## 2018-03-19 NOTE — Telephone Encounter (Signed)
Attempted to call pt back.  No answer and no VM set up.  Unable to leave message.  Pt d/c'ed 5/12 from hospital and advised to have BMP a week later.  Pt had CMET 5/14 at PCP office. Will advise pt that BMET may still be drawn at time of cardiology f/u since Lasix was restarted at 40mg  QD and labs were checked just 2 days later.

## 2018-03-19 NOTE — Telephone Encounter (Signed)
Spoke with pt and made him aware labs may need to be repeated since Dr. Reece Leader labs were done so soon after d/c.  Advised this would be up to [place] when he sees pt on 5/29.  Pt verbalized understanding and was appreciative for call.

## 2018-03-21 ENCOUNTER — Telehealth: Payer: Self-pay | Admitting: Family Medicine

## 2018-03-21 NOTE — Telephone Encounter (Signed)
Returning call.

## 2018-03-21 NOTE — Telephone Encounter (Signed)
Copied from [place] [id number]. Topic: Quick Communication - Lab Results >> Mar 21, 2018 11:38 AM Doylene Canning, CMA wrote: Called patient to inform them of 03/13/18 lab results. When patient returns call, triage nurse may disclose results.

## 2018-03-21 NOTE — Telephone Encounter (Signed)
Pt given results per notes of Dr. Carollee Herter on 03/18/18. Pt verbalized understanding. Unable to document in result note due to result note not being routed to [facility].

## 2018-03-24 ENCOUNTER — Emergency Department (HOSPITAL_COMMUNITY)
Admission: EM | Admit: 2018-03-24 | Discharge: 2018-03-25 | Disposition: A | Discharge status: Home / Self Care | Payer: Medicare Other | Attending: Emergency Medicine | Admitting: Emergency Medicine

## 2018-03-24 ENCOUNTER — Other Ambulatory Visit: Payer: Self-pay

## 2018-03-24 ENCOUNTER — Encounter (HOSPITAL_COMMUNITY): Payer: Self-pay | Admitting: Emergency Medicine

## 2018-03-24 ENCOUNTER — Emergency Department (HOSPITAL_COMMUNITY): Payer: Medicare Other

## 2018-03-24 DIAGNOSIS — Z794 Long term (current) use of insulin: Secondary | ICD-10-CM | POA: Diagnosis not present

## 2018-03-24 DIAGNOSIS — I2510 Atherosclerotic heart disease of native coronary artery without angina pectoris: Secondary | ICD-10-CM | POA: Diagnosis not present

## 2018-03-24 DIAGNOSIS — E1122 Type 2 diabetes mellitus with diabetic chronic kidney disease: Secondary | ICD-10-CM | POA: Diagnosis not present

## 2018-03-24 DIAGNOSIS — Z955 Presence of coronary angioplasty implant and graft: Secondary | ICD-10-CM | POA: Diagnosis not present

## 2018-03-24 DIAGNOSIS — I5032 Chronic diastolic (congestive) heart failure: Secondary | ICD-10-CM | POA: Diagnosis not present

## 2018-03-24 DIAGNOSIS — Z7902 Long term (current) use of antithrombotics/antiplatelets: Secondary | ICD-10-CM | POA: Diagnosis not present

## 2018-03-24 DIAGNOSIS — Z79899 Other long term (current) drug therapy: Secondary | ICD-10-CM | POA: Insufficient documentation

## 2018-03-24 DIAGNOSIS — I130 Hypertensive heart and chronic kidney disease with heart failure and stage 1 through stage 4 chronic kidney disease, or unspecified chronic kidney disease: Secondary | ICD-10-CM | POA: Diagnosis not present

## 2018-03-24 DIAGNOSIS — R002 Palpitations: Secondary | ICD-10-CM | POA: Diagnosis not present

## 2018-03-24 DIAGNOSIS — Z951 Presence of aortocoronary bypass graft: Secondary | ICD-10-CM | POA: Insufficient documentation

## 2018-03-24 DIAGNOSIS — Z7982 Long term (current) use of aspirin: Secondary | ICD-10-CM | POA: Diagnosis not present

## 2018-03-24 DIAGNOSIS — Z96653 Presence of artificial knee joint, bilateral: Secondary | ICD-10-CM | POA: Diagnosis not present

## 2018-03-24 DIAGNOSIS — R079 Chest pain, unspecified: Secondary | ICD-10-CM | POA: Diagnosis not present

## 2018-03-24 DIAGNOSIS — N183 Chronic kidney disease, stage 3 (moderate): Secondary | ICD-10-CM | POA: Diagnosis not present

## 2018-03-24 LAB — I-STAT TROPONIN, ED
TROPONIN I, POC: 0.01 ng/mL (ref 0.00–0.08)
Troponin i, poc: 0 ng/mL (ref 0.00–0.08)

## 2018-03-24 LAB — CBC
HCT: 38.7 % — ABNORMAL LOW (ref 39.0–52.0)
Hemoglobin: 12.7 g/dL — ABNORMAL LOW (ref 13.0–17.0)
MCH: 28 pg (ref 26.0–34.0)
MCHC: 32.8 g/dL (ref 30.0–36.0)
MCV: 85.2 fL (ref 78.0–100.0)
PLATELETS: 255 10*3/uL (ref 150–400)
RBC: 4.54 MIL/uL (ref 4.22–5.81)
RDW: 13 % (ref 11.5–15.5)
WBC: 7.2 10*3/uL (ref 4.0–10.5)

## 2018-03-24 LAB — BASIC METABOLIC PANEL
Anion gap: 9 (ref 5–15)
BUN: 14 mg/dL (ref 6–20)
CALCIUM: 9.1 mg/dL (ref 8.9–10.3)
CHLORIDE: 105 mmol/L (ref 101–111)
CO2: 27 mmol/L (ref 22–32)
CREATININE: 1.32 mg/dL — AB (ref 0.61–1.24)
GFR calc Af Amer: 59 mL/min — ABNORMAL LOW (ref >60)
GFR calc non Af Amer: 51 mL/min — ABNORMAL LOW (ref >60)
GLUCOSE: 165 mg/dL — AB (ref 65–99)
Potassium: 3.3 mmol/L — ABNORMAL LOW (ref 3.5–5.1)
Sodium: 141 mmol/L (ref 135–145)

## 2018-03-24 LAB — CBG MONITORING, ED
GLUCOSE-CAPILLARY: 122 mg/dL — AB (ref 65–99)
Glucose-Capillary: 50 mg/dL — ABNORMAL LOW (ref 65–99)

## 2018-03-24 MED ORDER — ACETAMINOPHEN 325 MG PO TABS
650.0000 mg | ORAL_TABLET | Freq: Once | ORAL | Status: AC
  Administered 2018-03-24: 650 mg via ORAL
  Filled 2018-03-24: qty 2

## 2018-03-24 NOTE — ED Triage Notes (Signed)
BIB [facility] with c/o intermittent palpitations and CP since 11am. Given 3 nitro with no relief and 324mg  ASA PTA. EMS reports that he reported the palpitations to them en route and there were no EKG changes. Recently seen approx 3 weeks ago for similar.  18LFA BP117/54 P78 RR18 97%RA 192CBG

## 2018-03-24 NOTE — ED Provider Notes (Signed)
[facility] EMERGENCY DEPARTMENT Provider Note   CSN: [id number] Arrival date & time: 03/24/18  1652     History   Chief Complaint Chief Complaint  Patient presents with  . Chest Pain    HPI Edward [name] is a 77 y.o. male.  HPI Patient reports that he got an episode of his heart racing today.  He reports it felt like it was fluttering in his chest.  He reports he had chest pain a number of times previously and recently did have a cardiac catheterization.  He however denies that he typically has experienced the kind of fluttering that he did and it scared him.  No lightheadedness syncope or near syncope. Past Medical History:  Diagnosis Date  . Adenomatous colon polyp 04/2011  . CAD (coronary artery disease)    a. 01/2015 DES to LAD  b. 12/2015: [place] 85% oLCx lesion--> rx therapy.  . Chronic diastolic CHF (congestive heart failure) ([place])    a. 05/2017 Echo: EF 60-65%, Gr1 DD, no rwma, mildly dil LA, nl RV fxn, mild TR.  . CKD (chronic kidney disease), stage II    a. probable CKD II-III with baseline CR 1.1-1.3.  . DM type 2 (diabetes mellitus, type 2), insulin dependent 01/29/2014   fasting cbg 50-120 with new regimen  . Dyslipidemia, goal LDL below 70 01/29/2014  . Enlarged prostate   . Essential tremor    a. on proprnolol  . GERD (gastroesophageal reflux disease)   . Hypertension   . Internal hemorrhoid   . Sleep apnea    does not use cpap (06/04/2015)  . TIA (transient ischemic attack) 2002    Patient Active Problem List   Diagnosis Date Noted  . Acute on chronic diastolic (congestive) heart failure ([place]) 03/08/2018  . Anxiety 07/24/2017  . Atelectasis   . Coronary artery disease involving coronary bypass graft of native heart with angina pectoris ([place])   . Hypertensive heart disease without heart failure   . Chronic kidney disease, stage III (moderate) ([place]) 11/03/2016  . Chronic diastolic heart failure ([place])   . Near syncope 04/19/2016  . Diabetes  mellitus ([place]) 03/11/2016  . GERD (gastroesophageal reflux disease) 08/27/2015  . Acute bronchitis 08/27/2015  . SOB (shortness of breath) 07/08/2015  . Obstructive sleep apnea 06/22/2015  . Bronchospasm 06/22/2015  . Essential tremor 04/23/2015  . CAP (community acquired pneumonia) 04/16/2014  . Essential hypertension 01/29/2014    Past Surgical History:  Procedure Laterality Date  . BACK SURGERY    . CARDIAC CATHETERIZATION  01/28/14   + CAD treat medically  . CARDIAC CATHETERIZATION N/A 12/21/2015   Procedure: Right/Left Heart Cath and Coronary Angiography;  Surgeon: Belva Crome, MD;  Location: [facility] CV LAB;  Service: Cardiovascular;  Laterality: N/A;  . CARDIAC CATHETERIZATION N/A 11/02/2016   Procedure: Left Heart Cath and Coronary Angiography;  Surgeon: Nelva Bush, MD;  Location: [facility];  Service: Cardiovascular;  Laterality: N/A;  . CATARACT EXTRACTION Bilateral   . CATARACT EXTRACTION, BILATERAL Bilateral   . CORONARY ANGIOPLASTY WITH STENT PLACEMENT  01/30/2015   DES [place]  [place] to LAD by Dr Tamala Julian  . CORONARY ARTERY BYPASS GRAFT N/A 11/09/2016   Procedure: CORONARY ARTERY BYPASS GRAFTING (CABG) x 2;  Surgeon: Melrose Nakayama, MD;  Location: [facility];  Service: Open Heart Surgery;  Laterality: N/A;  . ENDOVEIN HARVEST OF GREATER SAPHENOUS VEIN Left 11/09/2016   Procedure: ENDOVEIN HARVEST OF GREATER SAPHENOUS VEIN;  Surgeon: Remo Lipps  Chaya Jan, MD;  Location: [facility];  Service: Open Heart Surgery;  Laterality: Left;  . JOINT REPLACEMENT    . LEFT HEART CATH AND CORS/GRAFTS ANGIOGRAPHY N/A 03/09/2018   Procedure: LEFT HEART CATH AND CORS/GRAFTS ANGIOGRAPHY;  Surgeon: Belva Crome, MD;  Location: [facility] LAB;  Service: Cardiovascular;  Laterality: N/A;  . LEFT HEART CATHETERIZATION WITH CORONARY ANGIOGRAM N/A 01/28/2014   Procedure: LEFT HEART CATHETERIZATION WITH CORONARY ANGIOGRAM;  Surgeon: Sinclair Grooms, MD;  Location: [facility] CATH LAB;   Service: Cardiovascular;  Laterality: N/A;  . LEFT HEART CATHETERIZATION WITH CORONARY ANGIOGRAM N/A 01/30/2015   Procedure: LEFT HEART CATHETERIZATION WITH CORONARY ANGIOGRAM;  Surgeon: Belva Crome, MD;  Location: [facility] CATH LAB;  Service: Cardiovascular;  Laterality: N/A;  . LUMBAR [place]    . SHOULDER ARTHROSCOPY Right 07/30/2014   Procedure: Right Shoulder Arthroscopy, Debridement, Decompression, Manipulation Under Anesthesia;  Surgeon: Newt Minion, MD;  Location: [facility];  Service: Orthopedics;  Laterality: Right;  . TEE WITHOUT CARDIOVERSION N/A 11/09/2016   Procedure: TRANSESOPHAGEAL ECHOCARDIOGRAM (TEE);  Surgeon: Melrose Nakayama, MD;  Location: [facility];  Service: Open Heart Surgery;  Laterality: N/A;  . TOTAL KNEE ARTHROPLASTY Bilateral         Home Medications    Prior to Admission medications   Medication Sig Start Date End Date Taking? Authorizing Provider  acetaminophen (TYLENOL) 500 MG tablet Take 2 tablets (1,000 mg total) by mouth every 6 (six) hours as needed. 12/07/16   Leo Grosser, MD  albuterol (VENTOLIN HFA) 108 (90 Base) MCG/ACT inhaler inhale 2 puffs by mouth every 6 hours if needed for wheezing or shortness of breath 07/24/17   Carollee Herter, Alferd Apa, DO  amLODipine (NORVASC) 10 MG tablet Take 1 tablet (10 mg total) by mouth daily. 03/13/18   Ann Held, DO  aspirin EC 81 MG tablet Take 81 mg by mouth daily.    [provider]  bismuth subsalicylate (PEPTO BISMOL) 262 MG/15ML suspension Take 30 mLs by mouth as needed for indigestion.    [provider]  Ciclopirox 1 % shampoo Apply 1 application topically See admin instructions. Massage in scalp three times weekly and leave on for 10 min. Then rinse.    [provider]  clonazePAM (KLONOPIN) 1 MG tablet Take 1 tablet (1 mg total) by mouth at bedtime. 03/13/18   Ann Held, DO  clopidogrel (PLAVIX) 75 MG tablet Take 1 tablet (75 mg total) by mouth daily. 03/13/18   Ann Held, DO  diclofenac sodium (VOLTAREN) 1 % GEL Apply 2 g topically 4 (four) times daily as needed (for pain).     [provider]  furosemide (LASIX) 40 MG tablet Take 1 tablet (40 mg total) by mouth daily. 03/13/18   Ann Held, DO  gabapentin (NEURONTIN) 300 MG capsule take 1 capsule by mouth at bedtime for 1 week then INCREASE TO 1 CAP TWICE DAILY 03/13/18   Carollee Herter, Alferd Apa, DO  insulin NPH Human (HUMULIN N) 100 UNIT/ML injection Inject under skin 30 units 2x a day - ReliOn Patient taking differently: Inject 40 Units into the skin 2 (two) times daily before a meal. Inject under skin 30 units 2x a day - ReliOn 11/06/17   Philemon Kingdom, MD  insulin regular (NOVOLIN R) 100 units/mL injection Inject 0.2 mLs (20 Units total) into the skin 3 (three) times daily before meals. 03/15/18   Roma Schanz  R, DO  Insulin Syringe-Needle U-100 (BD INSULIN SYRINGE U/F) 31G X 5/16" 1 ML MISC Use 4 times per day to inject insulin. 01/16/18   Philemon Kingdom, MD  isosorbide mononitrate (IMDUR) 30 MG 24 hr tablet Take 15 mg by mouth daily. 07/17/17   [provider]  lisinopril (PRINIVIL,ZESTRIL) 20 MG tablet Take 20 mg by mouth daily.    [provider]  meclizine (ANTIVERT) 25 MG tablet Take 1 tablet (25 mg total) by mouth 2 (two) times daily as needed for dizziness. 06/10/17   Theora Gianotti, NP  nitroGLYCERIN (NITROSTAT) 0.4 MG SL tablet Place 1 tablet (0.4 mg total) under the tongue every 5 (five) minutes as needed for chest pain. Max: 3 doses per episode. 11/07/17   Burtis Junes, NP  pantoprazole (PROTONIX) 40 MG tablet Take 1 tablet (40 mg total) by mouth daily. 03/13/18   Ann Held, DO  Polyvinyl Alcohol-Povidone (REFRESH OP) Place 1 drop into both eyes 3 (three) times daily as needed (for dry eyes). Reported on 12/25/2015    [provider]  potassium chloride SA (K-DUR,KLOR-CON) 20 MEQ tablet Take 1.5 tablets (30 mEq  total) by mouth daily. 06/10/17   Theora Gianotti, NP  primidone (MYSOLINE) 50 MG tablet Take 25 mg by mouth 2 (two) times daily.    [provider]  propranolol (INDERAL) 40 MG tablet Take 1 tablet (40 mg total) by mouth 3 (three) times daily. 03/13/18   Roma Schanz R, DO  psyllium (METAMUCIL) 58.6 % packet Take 1 packet by mouth daily as needed (constipation).    [provider]  rosuvastatin (CRESTOR) 20 MG tablet Take 1 tablet (20 mg total) by mouth daily. 03/13/18 03/08/19  Ann Held, DO    Family History Family History  Problem Relation Age of Onset  . CAD Brother        2 brothers - CABG  . Alzheimer's disease Sister   . CAD Sister   . Prostate cancer Brother   . Asthma Brother        2 brothers   . Diabetes Other        entire family  . Colon cancer Neg Hx     Social History Social History   Tobacco Use  . Smoking status: Never Smoker  . Smokeless tobacco: Never Used  Substance Use Topics  . Alcohol use: No    Alcohol/week: 0.0 oz  . Drug use: No     Allergies   Pneumococcal vaccines   Review of Systems Review of Systems 10 Systems reviewed and are negative for acute change except as noted in the HPI.   Physical Exam Updated Vital Signs BP 134/62   Pulse (!) 56   Temp 98.5 F (36.9 C) (Oral)   Resp 15   Ht 5\' 8"  (1.727 m)   Wt 124.3 kg (274 lb)   SpO2 98%   BMI 41.66 kg/m   Physical Exam  Constitutional: He is oriented to person, place, and time. He appears well-developed and well-nourished.  HENT:  Head: Normocephalic and atraumatic.  Eyes: Pupils are equal, round, and reactive to light. EOM are normal.  Neck: Neck supple.  Cardiovascular: Normal rate, regular rhythm, normal heart sounds and intact distal pulses.  Pulmonary/Chest: Effort normal and breath sounds normal.  Abdominal: Soft. Bowel sounds are normal. He exhibits no distension. There is no tenderness.  Musculoskeletal: Normal range of  motion. He exhibits no edema.  Neurological: He is  alert and oriented to person, place, and time. He has normal strength. Coordination normal. GCS eye subscore is 4. GCS verbal subscore is 5. GCS motor subscore is 6.  Skin: Skin is warm, dry and intact.  Psychiatric: He has a normal mood and affect.     ED Treatments / Results  Labs (all labs ordered are listed, but only abnormal results are displayed) Labs Reviewed  BASIC METABOLIC PANEL - Abnormal; Notable for the following components:      Result Value   Potassium 3.3 (*)    Glucose, Bld 165 (*)    Creatinine, Ser 1.32 (*)    GFR calc non Af Amer 51 (*)    GFR calc Af Amer 59 (*)    All other components within normal limits  CBC - Abnormal; Notable for the following components:   Hemoglobin 12.7 (*)    HCT 38.7 (*)    All other components within normal limits  CBG MONITORING, ED - Abnormal; Notable for the following components:   Glucose-Capillary 50 (*)    All other components within normal limits  CBG MONITORING, ED - Abnormal; Notable for the following components:   Glucose-Capillary 122 (*)    All other components within normal limits  I-STAT TROPONIN, ED  I-STAT TROPONIN, ED  I-STAT TROPONIN, ED    EKG EKG Interpretation  Date/Time:  Saturday Mar 24 2018 17:02:03 EDT Ventricular Rate:  68 PR Interval:  264 QRS Duration: 94 QT Interval:  396 QTC Calculation: 421 R Axis:   43 Text Interpretation:  Sinus rhythm with 1st degree A-V block with Premature atrial complexes Septal infarct , age undetermined Abnormal ECG no sig change from previous Confirmed by Charlesetta Shanks [id number]) on 03/24/2018 10:19:25 PM   Radiology Dg Chest 2 View  Result Date: 03/24/2018 CLINICAL DATA:  Intermittent palpitations and chest pain. EXAM: CHEST - 2 VIEW COMPARISON:  Chest CT 03/10/2018 FINDINGS: Postsurgical changes from CABG. The cardiac silhouette is enlarged. Mediastinal contours appear intact. Calcific atherosclerotic disease of  the aorta. There is no evidence of focal airspace consolidation, pleural effusion or pneumothorax. Low lung volumes. Osseous structures are without acute abnormality. Soft tissues are grossly normal. IMPRESSION: Enlarged cardiac silhouette. Low lung volumes. Calcific atherosclerotic disease of the aorta. Electronically Signed   By: Fidela Salisbury M.D.   On: 03/24/2018 17:40    Procedures Procedures (including critical care time)  Medications Ordered in ED Medications  acetaminophen (TYLENOL) tablet 650 mg (650 mg Oral Given 03/24/18 1822)     Initial Impression / Assessment and Plan / ED Course  I have reviewed the triage vital signs and the nursing notes.  Pertinent labs & imaging results that were available during my care of the patient were reviewed by me and considered in my medical decision making (see chart for details).     Final Clinical Impressions(s) / ED Diagnoses   Final diagnoses:  Palpitation  Patient is clinically well appearance.  He is alert and in no distress.  Monitor has consistently shown sinus rhythm in the 60s.  Patient was admitted within the last week for chest pain and had cardiac catheterization.  recommendations were for medical management, no new stenting.  Patient reports that he had no chest pain today.  He was concerned because he perceived fluttering and racing of his heart.  Throughout his monitoring, rhythm has been sinus and controlled.  He did not have associated syncope or lightheadedness.  At this time, I do feel he is stable for  follow-up with cardiology for Holter monitor placement.  Patient actually has a follow-up appointment with his cardiologist this week.   ED Discharge Orders    None       Charlesetta Shanks, MD 03/25/18 [phone]

## 2018-03-24 NOTE — Discharge Instructions (Addendum)
1.  Call your cardiologist office on Monday to let them know you are seen in the emergency department for racing heart.  Go to your appointment next week as scheduled.

## 2018-03-24 NOTE — ED Triage Notes (Signed)
Pt reports that he had a heart cath performed while he was admitted and they did no intervention. Currently being medically managed.

## 2018-03-25 NOTE — ED Notes (Signed)
Pt departed in NAD.  

## 2018-03-28 ENCOUNTER — Ambulatory Visit (INDEPENDENT_AMBULATORY_CARE_PROVIDER_SITE_OTHER): Payer: Medicare Other | Admitting: Physician Assistant

## 2018-03-28 ENCOUNTER — Encounter: Payer: Self-pay | Admitting: Physician Assistant

## 2018-03-28 VITALS — BP 154/70 | HR 79 | Ht 68.0 in | Wt 284.0 lb

## 2018-03-28 DIAGNOSIS — I25810 Atherosclerosis of coronary artery bypass graft(s) without angina pectoris: Secondary | ICD-10-CM

## 2018-03-28 DIAGNOSIS — G250 Essential tremor: Secondary | ICD-10-CM

## 2018-03-28 DIAGNOSIS — I119 Hypertensive heart disease without heart failure: Secondary | ICD-10-CM | POA: Diagnosis not present

## 2018-03-28 DIAGNOSIS — N183 Chronic kidney disease, stage 3 unspecified: Secondary | ICD-10-CM

## 2018-03-28 DIAGNOSIS — E1159 Type 2 diabetes mellitus with other circulatory complications: Secondary | ICD-10-CM | POA: Diagnosis not present

## 2018-03-28 DIAGNOSIS — R002 Palpitations: Secondary | ICD-10-CM | POA: Diagnosis not present

## 2018-03-28 DIAGNOSIS — Z794 Long term (current) use of insulin: Secondary | ICD-10-CM

## 2018-03-28 DIAGNOSIS — I10 Essential (primary) hypertension: Secondary | ICD-10-CM

## 2018-03-28 DIAGNOSIS — I25709 Atherosclerosis of coronary artery bypass graft(s), unspecified, with unspecified angina pectoris: Secondary | ICD-10-CM | POA: Diagnosis not present

## 2018-03-28 DIAGNOSIS — I5032 Chronic diastolic (congestive) heart failure: Secondary | ICD-10-CM

## 2018-03-28 DIAGNOSIS — E785 Hyperlipidemia, unspecified: Secondary | ICD-10-CM | POA: Diagnosis not present

## 2018-03-28 MED ORDER — HYDRALAZINE HCL 25 MG PO TABS: 25.0000 mg | ORAL_TABLET | Freq: Three times a day (TID) | ORAL | 3 refills | Status: DC

## 2018-03-28 NOTE — Patient Instructions (Addendum)
Medication Instructions:  1. STOP IMDUR  2. START HYDRALAZINE 25 MG TABLET; DIRECTIONS TO READ TAKE 1 TABLET THREE TIMES A DAY (SPACE THESE OUT 8 HOURS APART)  Labwork: 1. TODAY CBC, BMET  2. IN 6 WEEKS FASTING LIPIDS AND LIVER PANEL; NOTHING TO EAT AFTER MIDNIGHT THE NIGHT BEFORE LAB WORK; MORNING MEDICATIONS WITH WATER IS FINE THE MORNING OF LAB WORK ;  Testing/Procedures: Your physician has recommended that you wear an 30 DAY event monitor. Event monitors are medical devices that record the heart's electrical activity. Doctors most often [place] these monitors to diagnose arrhythmias. Arrhythmias are problems with the speed or rhythm of the heartbeat. The monitor is a small, portable device. You can wear one while you do your normal daily activities. This is usually used to diagnose what is causing palpitations/syncope (passing out).    Follow-Up: FOLLOW UP IN 6 WEEKS WITH DR. Tamala Julian OR APP ON CARE TEAM  Any Other Special Instructions Will Be Listed Below (If Applicable).  MONITOR BLOOD PRESSURE AND CALL WITH READINGS AFTER 2 WEEKS TO Doreene Adas, [place]    If you need a refill on your cardiac medications before your next appointment, please call your pharmacy.

## 2018-03-28 NOTE — Progress Notes (Signed)
Cardiology Office Note:    Date:  03/28/2018   ID:  ARY LAVINE, DOB 04/27/1941, MRN [id number]  PCP:  Ann Held, DO  Cardiologist:  Sinclair Grooms, MD   Referring MD: Carollee Herter, Alferd Apa, *   Chief Complaint  Patient presents with  . Hospitalization Follow-up    chest pain, palpitations    History of Present Illness:    ANTAWAN MCHUGH is a 77 y.o. male with a hx of CAD s/p CABG 0/7371, chronic diastolic heart failure, HTN, HLD, DM2, CKD stage III, and OSA. He presented to [facility]18 with chest pain and dyspnea. He was given IV diuresis in the ER and cardiology was consulted. Troponin remained negative and BNP mildly elevated at 100. He had ongoing chest pain and repeat cardiac catheterization was performed. LHC showed 60% proximal LAD stenosis, widely patent mid LAD stent, paient LM, and totally occlulded ostial Cx to proximal Cx. He had patent SVG-OM2 and patent LIMA-dLAD. No intervention was made. Etiologies other than cardiac were suggested as the source of his chest discomfort.   He continued with IV diuresis. CTA ruled out PE. sCr trended up following CTA. Lasix held, but resumed upon discharge. He was discharged on 40 mg daily lasix on 03/11/18.  He reported back to the [facility] on 03/24/18 with an episode of rapid heart rate.  He reported feeling heart fluttering in his chest that scared him. He denied near-syncope and syncope. EKG with sinus with 1st degree heart block and PACs. He was observed and deemed stable for discharge. He was discharged from the ED with follow up in cardiology office today.   He presents today for follow up. He describes palpitations that lasted 2-3 min on 03/24/18.  He has had repeat episodes on 03/25/18 and 03/28/18. Episodes last about 2-3 min. He denies chest pain, shortness of breath, near-syncope, and syncope. He is doing well on ASA and plavix, no bleeding problems. He is taking 40 mg lasix daily and 30 mEq potassium.  He wants to increase  his lasix for lower extremity swelling, but last BMP last week with elevated creatinine relative to his presumed baseline.  He takes propranolol for tremors. I hesitate to increase propranolol as he has EKG with first degree heart block with HR 76.  His pressures have been running in the 062I systolic, but he has not been taking his imdur. He states it is too expensive through his mail order. I pointed out that it is 55 at [place].    Past Medical History:  Diagnosis Date  . Adenomatous colon polyp 04/2011  . CAD (coronary artery disease)    a. 01/2015 DES to LAD  b. 12/2015: [place] 85% oLCx lesion--> rx therapy.  . Chronic diastolic CHF (congestive heart failure) ()    a. 05/2017 Echo: EF 60-65%, Gr1 DD, no rwma, mildly dil LA, nl RV fxn, mild TR.  . CKD (chronic kidney disease), stage II    a. probable CKD II-III with baseline CR 1.1-1.3.  . DM type 2 (diabetes mellitus, type 2), insulin dependent 01/29/2014   fasting cbg 50-120 with new regimen  . Dyslipidemia, goal LDL below 70 01/29/2014  . Enlarged prostate   . Essential tremor    a. on proprnolol  . GERD (gastroesophageal reflux disease)   . Hypertension   . Internal hemorrhoid   . Sleep apnea    does not use cpap (06/04/2015)  . TIA (transient ischemic attack) 2002  Past Surgical History:  Procedure Laterality Date  . BACK SURGERY    . CARDIAC CATHETERIZATION  01/28/14   + CAD treat medically  . CARDIAC CATHETERIZATION N/A 12/21/2015   Procedure: Right/Left Heart Cath and Coronary Angiography;  Surgeon: Belva Crome, MD;  Location: [place] [facility];  Service: Cardiovascular;  Laterality: N/A;  . CARDIAC CATHETERIZATION N/A 11/02/2016   Procedure: Left Heart Cath and Coronary Angiography;  Surgeon: Nelva Bush, MD;  Location: [place] [facility];  Service: Cardiovascular;  Laterality: N/A;  . CATARACT EXTRACTION Bilateral   . CATARACT EXTRACTION, BILATERAL Bilateral   . CORONARY ANGIOPLASTY WITH STENT PLACEMENT  01/30/2015     DES Promus  [place] to LAD by Dr Tamala Julian  . CORONARY ARTERY BYPASS GRAFT N/A 11/09/2016   Procedure: CORONARY ARTERY BYPASS GRAFTING (CABG) x 2;  Surgeon: Melrose Nakayama, MD;  Location: [place];  Service: Open Heart Surgery;  Laterality: N/A;  . ENDOVEIN HARVEST OF GREATER SAPHENOUS VEIN Left 11/09/2016   Procedure: ENDOVEIN HARVEST OF GREATER SAPHENOUS VEIN;  Surgeon: Melrose Nakayama, MD;  Location: [place];  Service: Open Heart Surgery;  Laterality: Left;  . JOINT REPLACEMENT    . LEFT HEART CATH AND CORS/GRAFTS ANGIOGRAPHY N/A 03/09/2018   Procedure: LEFT HEART CATH AND CORS/GRAFTS ANGIOGRAPHY;  Surgeon: Belva Crome, MD;  Location: [place] [facility];  Service: Cardiovascular;  Laterality: N/A;  . LEFT HEART CATHETERIZATION WITH CORONARY ANGIOGRAM N/A 01/28/2014   Procedure: LEFT HEART CATHETERIZATION WITH CORONARY ANGIOGRAM;  Surgeon: Sinclair Grooms, MD;  Location[facility] CATH [facility];  Service: Cardiovascular;  Laterality: N/A;  . LEFT HEART CATHETERIZATION WITH CORONARY ANGIOGRAM N/A 01/30/2015   Procedure: LEFT HEART CATHETERIZATION WITH CORONARY ANGIOGRAM;  Surgeon: Belva Crome, MD;  Location: [facility] CATH LAB;  Service: Cardiovascular;  Laterality: N/A;  . LUMBAR [place]    . SHOULDER ARTHROSCOPY Right 07/30/2014   Procedure: Right Shoulder Arthroscopy, Debridement, Decompression, Manipulation Under Anesthesia;  Surgeon: Newt Minion, MD;  Location: [place];  Service: Orthopedics;  Laterality: Right;  . TEE WITHOUT CARDIOVERSION N/A 11/09/2016   Procedure: TRANSESOPHAGEAL ECHOCARDIOGRAM (TEE);  Surgeon: Melrose Nakayama, MD;  Location: [place];  Service: Open Heart Surgery;  Laterality: N/A;  . TOTAL KNEE ARTHROPLASTY Bilateral     Current Medications: Current Meds  Medication Sig  . acetaminophen (TYLENOL) 500 MG tablet Take 2 tablets (1,000 mg total) by mouth every 6 (six) hours as needed.  Marland Kitchen albuterol (VENTOLIN HFA) 108 (90 Base) MCG/ACT inhaler inhale 2 puffs by mouth every 6  hours if needed for wheezing or shortness of breath  . amLODipine (NORVASC) 10 MG tablet Take 1 tablet (10 mg total) by mouth daily.  Marland Kitchen aspirin EC 81 MG tablet Take 81 mg by mouth daily.  Marland Kitchen bismuth subsalicylate (PEPTO BISMOL) 262 MG/15ML suspension Take 30 mLs by mouth as needed for indigestion.  . Ciclopirox 1 % shampoo Apply 1 application topically See admin instructions. Massage in scalp three times weekly and leave on for 10 min. Then rinse.  . clonazePAM (KLONOPIN) 1 MG tablet Take 1 tablet (1 mg total) by mouth at bedtime.  . clopidogrel (PLAVIX) 75 MG tablet Take 1 tablet (75 mg total) by mouth daily.  . diclofenac sodium (VOLTAREN) 1 % GEL Apply 2 g topically 4 (four) times daily as needed (for pain).   . furosemide (LASIX) 40 MG tablet Take 1 tablet (40 mg total) by mouth daily.  Marland Kitchen gabapentin (NEURONTIN) 300 MG  capsule take 1 capsule by mouth at bedtime for 1 week then INCREASE TO 1 CAP TWICE DAILY  . insulin NPH Human (HUMULIN N) 100 UNIT/ML injection Inject under skin 30 units 2x a day - ReliOn (Patient taking differently: Inject 40 Units into the skin 2 (two) times daily before a meal. Inject under skin 30 units 2x a day - ReliOn)  . insulin regular (NOVOLIN R) 100 units/mL injection Inject 0.2 mLs (20 Units total) into the skin 3 (three) times daily before meals.  . Insulin Syringe-Needle U-100 (BD INSULIN SYRINGE U/F) 31G X 5/16" 1 ML MISC Use 4 times per day to inject insulin.  Marland Kitchen lisinopril (PRINIVIL,ZESTRIL) 20 MG tablet Take 20 mg by mouth daily.  . meclizine (ANTIVERT) 25 MG tablet Take 1 tablet (25 mg total) by mouth 2 (two) times daily as needed for dizziness.  . nitroGLYCERIN (NITROSTAT) 0.4 MG SL tablet Place 1 tablet (0.4 mg total) under the tongue every 5 (five) minutes as needed for chest pain. Max: 3 doses per episode.  . pantoprazole (PROTONIX) 40 MG tablet Take 1 tablet (40 mg total) by mouth daily.  . Polyvinyl Alcohol-Povidone (REFRESH OP) Place 1 drop into both eyes 3  (three) times daily as needed (for dry eyes). Reported on 12/25/2015  . potassium chloride SA (K-DUR,KLOR-CON) 20 MEQ tablet Take 1.5 tablets (30 mEq total) by mouth daily.  . primidone (MYSOLINE) 50 MG tablet Take 25 mg by mouth 2 (two) times daily.  . propranolol (INDERAL) 40 MG tablet Take 1 tablet (40 mg total) by mouth 3 (three) times daily.  . psyllium (METAMUCIL) 58.6 % packet Take 1 packet by mouth daily as needed (constipation).  . rosuvastatin (CRESTOR) 20 MG tablet Take 1 tablet (20 mg total) by mouth daily.  . [DISCONTINUED] isosorbide mononitrate (IMDUR) 30 MG 24 hr tablet Take 15 mg by mouth daily.     Allergies:   Pneumococcal vaccines   Social History   Socioeconomic History  . Marital status: Married    Spouse name: Not on file  . Number of children: 0  . Years of education: Not on file  . Highest education level: Not on file  Occupational History  . Occupation: retired  Scientific laboratory technician  . Financial resource strain: Not on file  . Food insecurity:    Worry: Not on file    Inability: Not on file  . Transportation needs:    Medical: Not on file    Non-medical: Not on file  Tobacco Use  . Smoking status: Never Smoker  . Smokeless tobacco: Never Used  Substance and Sexual Activity  . Alcohol use: No    Alcohol/week: 0.0 oz  . Drug use: No  . Sexual activity: Never  Lifestyle  . Physical activity:    Days per week: Not on file    Minutes per session: Not on file  . Stress: Not on file  Relationships  . Social connections:    Talks on phone: Not on file    Gets together: Not on file    Attends religious service: Not on file    Active member of club or organization: Not on file    Attends meetings of clubs or organizations: Not on file    Relationship status: Not on file  Other Topics Concern  . Not on file  Social History Narrative   He is a Geophysicist/field seismologist by trade.   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   He lives  alone.   His wife is living in [place] at this time.  They do not have children.   Highest level of education:  College graduate     Family History: The patient's family history includes Alzheimer's disease in his sister; Asthma in his brother; CAD in his brother and sister; Diabetes in his other; Prostate cancer in his brother. There is no history of Colon cancer.  ROS:   Please see the history of present illness.     All other systems reviewed and are negative.  EKGs/Labs/Other Studies Reviewed:    The following studies were reviewed today:  Cardiac Catheterization: 03/09/2018  Difficult procedure from left radial approach due to difficulty with torque control. Would advise femoral approach in the future.  Eccentric 60% proximal LAD. Widely patent mid LAD stent. Antegrade flow to the apex.  Widely patent left main.  Totally occluded ostial circumflex to proximal circumflex.  Proximal mid and distal RCA moderate disease with tandem 65 to 75% % distal segmental stenoses.  Patent saphenous vein graft to the second obtuse marginal which supplies the entire circumflex territory.  Widely patent LIMA to the distal segment of the LAD. Competitive flow is noted.  Low normal LV systolic function with EF 50 to 55%. Mildly elevated end-diastolic pressure.  Continuous sharp left chest pain not felt to be ischemia related.  RECOMMENDATIONS:   Consider musculoskeletal or neuropathic pain as a source of the patient's presentation.  Continue aggressive risk factor modification for secondary prevention of ischemic events.  If No Local complications, could be discharged later today.    EKG:  EKG is ordered today.  The ekg ordered today demonstrates sinus with 1st degree heart block  Recent Labs: 06/06/2017: TSH 1.362 03/08/2018: B Natriuretic Peptide 100.9; Magnesium 1.6 03/13/2018: ALT 18 03/24/2018: BUN 14; Creatinine, Ser 1.32; Hemoglobin 12.7; Platelets 255; Potassium 3.3; Sodium 141  Recent  Lipid Panel    Component Value Date/Time   CHOL 183 03/07/2018 0953   TRIG 87 03/07/2018 0953   HDL 38 (L) 03/07/2018 0953   CHOLHDL 4.8 03/07/2018 0953   CHOLHDL 5 07/24/2017 1145   VLDL 31.4 07/24/2017 1145   LDLCALC 128 (H) 03/07/2018 0953   LDLDIRECT 144.0 07/08/2015 1344    Physical Exam:    VS:  BP (!) 154/70   Pulse 79   Ht 5\' 8"  (1.727 m)   Wt 284 lb (128.8 kg)   SpO2 97%   BMI 43.18 kg/m     Wt Readings from Last 3 Encounters:  03/28/18 284 lb (128.8 kg)  03/24/18 274 lb (124.3 kg)  03/13/18 274 lb 6.4 oz (124.5 kg)     GEN:  Well nourished, well developed in no acute distress HEENT: Normal NECK: No JVD; No carotid bruits CARDIAC: RRR, no murmurs, rubs, gallops RESPIRATORY:  Clear to auscultation without rales, wheezing or rhonchi, was not taking deep breaths ABDOMEN: Soft, non-tender, non-distended MUSCULOSKELETAL:  1+ B LE edema; No deformity  SKIN: Warm and dry NEUROLOGIC:  Alert and oriented x 3 PSYCHIATRIC:  Normal affect   ASSESSMENT:    1. Coronary artery disease involving coronary bypass graft of native heart with angina pectoris (HCC)   2. Palpitations   3. Essential hypertension   4. Chronic diastolic heart failure ([place])   5. Hypertensive heart disease without heart failure   6. Type 2 diabetes mellitus with other circulatory complication, with long-term current use of insulin ([place])   7. Essential tremor   8. Chronic kidney disease, stage III (  moderate) ([place])   9. Hyperlipidemia LDL goal <70    PLAN:    In order of problems listed above:  Coronary artery disease involving coronary bypass graft of native heart with angina pectoris ([place])  He is doing well on ASA and plavix. He denies chest pain off of imdur. Will discontinue imdur as he has not been taking it due to cost. Will switch to hydralazine as below for better pressure control. Will collect CBC.   Palpitations  He just started having episodes since 03/24/18. EKG today with sinus  rhythm, 1st degree heart block. Will order 30-day event monitor. Hesitate to increase propranolol as he takes this for tremors and has a 1st degree heart block.   Essential hypertension He has been taking norvasc, lisinopril, and propranolol. Pressures have been uncontrolled in the 150s. He has not been taking imdur. Added hydralazine (on $4 list at [facility]) 25 mg TID. He will call with his pressures. His elevated pressure is not helping his renal function. We discussed the importance of pressure control.    Chronic diastolic heart failure (HCC) Lower extremity edema, lungs clear and no JVD. Will keep lasix at 40 mg daily until BMP comes back. If still elevated, will repeat BMP in 2 weeks. If creatinine is better, will try to increase lasix to 60 mg and recheck BMP in 1 week. He was on 40 mg BID, PCP has not increased given his renal function.   Hypertensive heart disease without heart failure Medication changes as above.   Type 2 diabetes mellitus with other circulatory complication, with long-term current use of insulin (HCC) A1c 10.2%. Insulin has recently been changed. Stressed importance of risk factor modification for his coronary disease.  Essential tremor Continue propranolol.  Chronic kidney disease, stage III (moderate) (HCC) Recheck BMP as above. Adjust lasix as above. Continue lisinopril in the setting of CKD and diabetes.    HLD with LDL goal less than 70 07/24/2017: VLDL 31.4 03/07/2018: Cholesterol, Total 183; HDL 38; LDL Calculated 128; Triglycerides 87 Repeat fasting lipids and LFTs when he returns in 6 weeks. He was switched from pravachol to crestor at discharge from the hospital. He has a 90-day supply of crestor, but may not be able to afford future crestor if he doesn't have another coupon card.     Follow up in 6 weeks after event monitor.   Medication Adjustments/Labs and Tests Ordered: Current medicines are reviewed at length with the patient today.  Concerns  regarding medicines are outlined above.  Orders Placed This Encounter  Procedures  . CBC  . Basic Metabolic Panel (BMET)  . Lipid Profile  . Hepatic function panel  . Cardiac event monitor  . EKG 12-Lead   Meds ordered this encounter  Medications  . hydrALAZINE (APRESOLINE) 25 MG tablet    Sig: Take 1 tablet (25 mg total) by mouth 3 (three) times daily.    Dispense:  270 tablet    Refill:  3    Signed, Ledora Bottcher, [place]  03/28/2018 12:07 PM    [facility]

## 2018-03-29 ENCOUNTER — Telehealth: Payer: Self-pay | Admitting: *Deleted

## 2018-03-29 DIAGNOSIS — R002 Palpitations: Secondary | ICD-10-CM

## 2018-03-29 LAB — BASIC METABOLIC PANEL
BUN / CREAT RATIO: 10 (ref 10–24)
BUN: 10 mg/dL (ref 8–27)
CALCIUM: 9.8 mg/dL (ref 8.6–10.2)
CHLORIDE: 100 mmol/L (ref 96–106)
CO2: 27 mmol/L (ref 20–29)
Creatinine, Ser: 0.99 mg/dL (ref 0.76–1.27)
GFR calc Af Amer: 86 mL/min/{1.73_m2} (ref >59)
GFR calc non Af Amer: 74 mL/min/{1.73_m2} (ref >59)
GLUCOSE: 167 mg/dL — AB (ref 65–99)
POTASSIUM: 3.7 mmol/L (ref 3.5–5.2)
Sodium: 141 mmol/L (ref 134–144)

## 2018-03-29 LAB — CBC
Hematocrit: 38 % (ref 37.5–51.0)
Hemoglobin: 12.6 g/dL — ABNORMAL LOW (ref 13.0–17.7)
MCH: 28.2 pg (ref 26.6–33.0)
MCHC: 33.2 g/dL (ref 31.5–35.7)
MCV: 85 fL (ref 79–97)
Platelets: 263 10*3/uL (ref 150–450)
RBC: 4.47 x10E6/uL (ref 4.14–5.80)
RDW: 14.4 % (ref 12.3–15.4)
WBC: 6.7 10*3/uL (ref 3.4–10.8)

## 2018-03-29 MED ORDER — FUROSEMIDE 40 MG PO TABS: 60.0000 mg | ORAL_TABLET | Freq: Every day | ORAL | 3 refills | Status: DC

## 2018-03-29 NOTE — Telephone Encounter (Signed)
Pt has been notified of lab results. Pt is agreeable to plan of care to increase lasix to 60 mg daily; bmet on 6/3 when he comes in for his monitor. Pt thanked me for the call today.

## 2018-03-29 NOTE — Telephone Encounter (Signed)
-----   Message from Ledora Bottcher, [place] sent at 03/29/2018  8:28 AM EDT ----- Your labs are normal, including your kidney function. You may increase your lasix to 60 mg in the morning (1.5 tablets). Keep your potassium the same - 30 mEq. We will need to repeat your BMP labs in 1 week.

## 2018-04-02 ENCOUNTER — Other Ambulatory Visit: Payer: Medicare Other | Admitting: *Deleted

## 2018-04-02 ENCOUNTER — Ambulatory Visit (INDEPENDENT_AMBULATORY_CARE_PROVIDER_SITE_OTHER): Payer: Medicare Other

## 2018-04-02 DIAGNOSIS — R002 Palpitations: Secondary | ICD-10-CM | POA: Diagnosis not present

## 2018-04-02 LAB — BASIC METABOLIC PANEL
BUN / CREAT RATIO: 13 (ref 10–24)
BUN: 15 mg/dL (ref 8–27)
CHLORIDE: 103 mmol/L (ref 96–106)
CO2: 25 mmol/L (ref 20–29)
Calcium: 8.8 mg/dL (ref 8.6–10.2)
Creatinine, Ser: 1.2 mg/dL (ref 0.76–1.27)
GFR, EST AFRICAN AMERICAN: 68 mL/min/{1.73_m2} (ref >59)
GFR, EST NON AFRICAN AMERICAN: 59 mL/min/{1.73_m2} — AB (ref >59)
Glucose: 151 mg/dL — ABNORMAL HIGH (ref 65–99)
POTASSIUM: 4.3 mmol/L (ref 3.5–5.2)
Sodium: 139 mmol/L (ref 134–144)

## 2018-04-19 ENCOUNTER — Telehealth: Payer: Self-pay | Admitting: Neurology

## 2018-04-19 NOTE — Telephone Encounter (Signed)
Left message for patient to call me back. 

## 2018-04-19 NOTE — Telephone Encounter (Signed)
Please find out how much primidone he is taking - if he is on primidone 25mg  twice daily (take half of the 50mg  tablet), then we can increase this to 50mg  twice daily. OK to add on 7/3 at 8am, and put him on a wait list.

## 2018-04-19 NOTE — Telephone Encounter (Signed)
Any suggestions on where we can put him?   Please advise.

## 2018-04-20 NOTE — Telephone Encounter (Signed)
Patient coming in on Monday, June 24.

## 2018-04-23 ENCOUNTER — Ambulatory Visit: Payer: Self-pay

## 2018-04-23 ENCOUNTER — Ambulatory Visit: Payer: Medicare Other | Admitting: Neurology

## 2018-04-23 ENCOUNTER — Encounter: Payer: Self-pay | Admitting: Neurology

## 2018-04-23 ENCOUNTER — Ambulatory Visit (INDEPENDENT_AMBULATORY_CARE_PROVIDER_SITE_OTHER): Payer: Medicare Other | Admitting: Neurology

## 2018-04-23 VITALS — BP 148/70 | HR 74 | Ht 68.0 in | Wt 280.4 lb

## 2018-04-23 DIAGNOSIS — R2681 Unsteadiness on feet: Secondary | ICD-10-CM | POA: Diagnosis not present

## 2018-04-23 DIAGNOSIS — E0842 Diabetes mellitus due to underlying condition with diabetic polyneuropathy: Secondary | ICD-10-CM

## 2018-04-23 DIAGNOSIS — G3184 Mild cognitive impairment, so stated: Secondary | ICD-10-CM | POA: Diagnosis not present

## 2018-04-23 DIAGNOSIS — I25810 Atherosclerosis of coronary artery bypass graft(s) without angina pectoris: Secondary | ICD-10-CM | POA: Diagnosis not present

## 2018-04-23 DIAGNOSIS — G250 Essential tremor: Secondary | ICD-10-CM | POA: Diagnosis not present

## 2018-04-23 DIAGNOSIS — R519 Headache, unspecified: Secondary | ICD-10-CM

## 2018-04-23 DIAGNOSIS — R51 Headache: Secondary | ICD-10-CM

## 2018-04-23 MED ORDER — PRIMIDONE 50 MG PO TABS: 50.0000 mg | ORAL_TABLET | Freq: Two times a day (BID) | ORAL | 11 refills | Status: DC

## 2018-04-23 MED ORDER — CYCLOBENZAPRINE HCL 5 MG PO TABS: 5.0000 mg | ORAL_TABLET | Freq: Every evening | ORAL | 1 refills | Status: DC | PRN

## 2018-04-23 NOTE — Progress Notes (Signed)
Follow-up Visit   Date: 04/23/18    Edward [name] MRN: [id number] DOB: 1941/09/07   Interim History: Edward [name] is a 77 y.o. right-handed African American male with coronary artery disease, TIA (2002, manifested as right sided numbness), insulin dependent diabetes mellitus type 2, hypertension, hyperlipidemia, GERD and BPH returning to the clinic for follow-up of essential tremor as well as new complaints of memory changes and headaches.  The patient was accompanied to the clinic by brother.  History of present illness: He moved from [place] in February 2015 to be closer to his brother. He has been diabetic since 1985 and in late 1990s, he developed numbness > tingling of the feet which seems to have been stable since onset. He has paresthesias below the level of the ankles, worse on the right side, as well as interemittent numbness/tingling of the right hand. He ambulates independently. He was started neurontin 100mg  twice daily.  He was seeing Dr. Gery Pray in [place] for memory loss and headaches in 2014. Headaches improved significantly after he retired. He has short-term memory problems, which does not interefere with daily life. Mostly forgetting little details or tasks. He is driving and has not been involving in any accidents. He is able to do all his IADLs and ADLs.   Starting in 2015, he began having tremors of the hands and voice.  Family history was also notable for tremor in sister, mother, and two brothers have tremors.  He was started on propranolol 40mg  TID which helped some. Over the years, his tremors have gradually worsened. He was started on gabapentin 300mg  BID and noticed that clonazepman which he was taking for anxiety, helped.  During the summer of 2018, he started having sharp headaches.  Imaging did not show any worrisome intracranial pathology.   UPDATE 01/19/2018:  He is here for 9 month follow-up visit.  About a month ago, he stopped his  clonazepam because it made him feel depressed and has noticed worsening hand tremors as a result.  He continues to have headaches and treats this with tylenol.  Tremors continue to interfere with his daily activities such as cooking, eating, and handwriting.   UPDATE 04/23/2018:  He scheduled a sooner visit because of worsening headaches, which involve the temporal and bifrontal region, and is requesting CT head. Headaches are worse in the morning.  He also complains of tremor, which is affecting his fine motor movements and unable to tasks such as cooking, eating, and hand writing.  Today, he also complains of memory changes and is worried that he has dementia.  He is misplacing things, he is able to keep up with appointments, finances, and medications. He has self-restricted driving.  No changes in behavior.  He suffered a fall and injured his left shin which has a wound that is slow to heel.    Medications:  Current Outpatient Medications on File Prior to Visit  Medication Sig Dispense Refill  . acetaminophen (TYLENOL) 500 MG tablet Take 2 tablets (1,000 mg total) by mouth every 6 (six) hours as needed. 30 tablet 0  . albuterol (VENTOLIN HFA) 108 (90 Base) MCG/ACT inhaler inhale 2 puffs by mouth every 6 hours if needed for wheezing or shortness of breath 18 g 1  . amLODipine (NORVASC) 10 MG tablet Take 1 tablet (10 mg total) by mouth daily. 90 tablet 1  . aspirin EC 81 MG tablet Take 81 mg by mouth daily.    Marland Kitchen bismuth subsalicylate (PEPTO BISMOL) 262  MG/15ML suspension Take 30 mLs by mouth as needed for indigestion.    . Ciclopirox 1 % shampoo Apply 1 application topically See admin instructions. Massage in scalp three times weekly and leave on for 10 min. Then rinse.    . clonazePAM (KLONOPIN) 1 MG tablet Take 1 tablet (1 mg total) by mouth at bedtime. 30 tablet 1  . clopidogrel (PLAVIX) 75 MG tablet Take 1 tablet (75 mg total) by mouth daily. 90 tablet 3  . diclofenac sodium (VOLTAREN) 1 % GEL  Apply 2 g topically 4 (four) times daily as needed (for pain).     . furosemide (LASIX) 40 MG tablet Take 1.5 tablets (60 mg total) by mouth daily. 135 tablet 3  . gabapentin (NEURONTIN) 300 MG capsule take 1 capsule by mouth at bedtime for 1 week then INCREASE TO 1 CAP TWICE DAILY 60 capsule 11  . hydrALAZINE (APRESOLINE) 25 MG tablet Take 1 tablet (25 mg total) by mouth 3 (three) times daily. 270 tablet 3  . insulin NPH Human (HUMULIN N) 100 UNIT/ML injection Inject under skin 30 units 2x a day - ReliOn (Patient taking differently: Inject 40 Units into the skin 2 (two) times daily before a meal. Inject under skin 30 units 2x a day - ReliOn) 20 mL 5  . insulin regular (NOVOLIN R) 100 units/mL injection Inject 0.2 mLs (20 Units total) into the skin 3 (three) times daily before meals. 30 mL 1  . Insulin Syringe-Needle U-100 (BD INSULIN SYRINGE U/F) 31G X 5/16" 1 ML MISC Use 4 times per day to inject insulin. 300 each 5  . lisinopril (PRINIVIL,ZESTRIL) 20 MG tablet Take 20 mg by mouth daily.    . meclizine (ANTIVERT) 25 MG tablet Take 1 tablet (25 mg total) by mouth 2 (two) times daily as needed for dizziness. 30 tablet 1  . nitroGLYCERIN (NITROSTAT) 0.4 MG SL tablet Place 1 tablet (0.4 mg total) under the tongue every 5 (five) minutes as needed for chest pain. Max: 3 doses per episode. 25 tablet 3  . pantoprazole (PROTONIX) 40 MG tablet Take 1 tablet (40 mg total) by mouth daily. 90 tablet 3  . Polyvinyl Alcohol-Povidone (REFRESH OP) Place 1 drop into both eyes 3 (three) times daily as needed (for dry eyes). Reported on 12/25/2015    . potassium chloride SA (K-DUR,KLOR-CON) 20 MEQ tablet Take 1.5 tablets (30 mEq total) by mouth daily. 45 tablet 6  . propranolol (INDERAL) 40 MG tablet Take 1 tablet (40 mg total) by mouth 3 (three) times daily. 270 tablet 3  . psyllium (METAMUCIL) 58.6 % packet Take 1 packet by mouth daily as needed (constipation).    . rosuvastatin (CRESTOR) 20 MG tablet Take 1 tablet  (20 mg total) by mouth daily. 90 tablet 3   No current facility-administered medications on file prior to visit.     Allergies:  Allergies  Allergen Reactions  . Pneumococcal Vaccines Anaphylaxis    Review of Systems:  CONSTITUTIONAL: No fevers, chills, night sweats, or weight loss.  EYES: No visual changes or eye pain ENT: No hearing changes.  No history of nose bleeds.   RESPIRATORY: No cough, wheezing and shortness of breath.   CARDIOVASCULAR: Negative for chest pain, and palpitations.   GI: Negative for abdominal discomfort, blood in stools or black stools.  No recent change in bowel habits.   GU:  No history of incontinence.   MUSCLOSKELETAL: +history of joint pain +swelling.  No myalgias.   SKIN: +for lesions, rash,  and itching.   ENDOCRINE: Negative for cold or heat intolerance, polydipsia or goiter.   PSYCH:  no depression or anxiety symptoms.   NEURO: As Above.   Vital Signs:  BP (!) 148/70   Pulse 74   Ht 5\' 8"  (1.727 m)   Wt 280 lb 6 oz (127.2 kg)   SpO2 98%   BMI 42.63 kg/m   General Medical Exam:   General:  Well appearing, comfortable  Eyes/ENT: see cranial nerve examination.   Neck: No masses appreciated.  Full range of motion without tenderness.  No carotid bruits. Respiratory:  Clear to auscultation, good air entry bilaterally.   Cardiac:  Regular rate and rhythm, no murmur.   Ext:  Marked edema of the legs with venostasis changes and healing skin lesion on the left shin  Neurological Exam: MENTAL STATUS including orientation to time, place, person, recent and remote memory, attention span and concentration, language, and fund of knowledge is fairly intact.  Speech is not dysarthric, tremulous pattern.  Montreal Cognitive Assessment  04/23/2018  Visuospatial/ Executive (0/5) 1  Naming (0/3) 2  Attention: Read list of digits (0/2) 2  Attention: Read list of letters (0/1) 1  Attention: Serial 7 subtraction starting at 100 (0/3) 2  Language: Repeat  phrase (0/2) 2  Language : Fluency (0/1) 0  Abstraction (0/2) 1  Delayed Recall (0/5) 0  Orientation (0/6) 6  Total 17  Adjusted Score (based on education) 17   CRANIAL NERVES:  Pupils are round and reactive. Extraocular muscle are intact.  Mild bilateral ptosis (old) Face is symmetric.   MOTOR:  Motor strength is 5/5 in all extremities.  There is a mild hand tremor which is worse with finger to nose testing.  No pronator drift.  Tone is normal.    MSRs:  Reflexes are 2+/4 in the upper extremities and absent in the legs  COORDINATION/GAIT:  Normal finger-to- nose-finger.  Gait is wide-based, slow.   Data: EMG 12/01/2014: 1. 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 Overall, these findings are moderately severe in degree electrically.  2. 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 There is no evidence of a cervical/lumbosacral radiculopathy affecting the right side  Lab Results  Component Value Date   HGBA1C 10.2 (H) 03/13/2018   Lab Results  Component Value Date   TSH 1.362 06/06/2017   Labs 04/08/2014:  Copper 64*, ceruloplasmin 18, vitamin B12 290, SPEP/UPEP with IFE no M protein  CT head 10/21/2014 and 01/15/2015:  Negative  CT head wo contrast 04/21/2016:  Negative  IMPRESSION/PLAN: 1.  Essential tremor involving the hands and voice, worsening.    - Increase primidone to 50mg  twice daily.  Call with update in 1 month, if tolerating increase to 75mg  BID  - Continue propranolol 40mg  TID.   - Continue gabapentin 300mg  twice daily  2.  Mild cognitive impairment he did scored poorly on his [place] with 17/30 which may be due to effort.  He is still able to keep up with IADLs and ADLs making dementia less likely.  - Formal neurocognitive testing  - MRI brain was declined due to claustrophobia.  If there are  signs of dementia on his neuropsychological testing, he will reconsider this  3.  Chronic daily headache  - No worrisome signs on his exam.    - He does complain of morning headaches which is  can be seen in untreated OSA.  Recommend he follow-up with pulmonology for re-evaluation   - Start flexeril 5mg  at bedtime as needed  3.  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  EMG with moderately severe neuropathy affecting the right side.     - Start home PT for gait training  - His HbA1c is trending up to 10.2, which is followed by Dr. Cruzita Lederer  4.  Skin lesion, poor wound healing follow-up with PCP  Return to clinic in 2 months  Greater than 50% of this 40 minute visit was spent in counseling, explanation of diagnosis, planning of further management, and coordination of care.   Thank you for allowing me to participate in patient's care.  If I can answer any additional questions, I would be pleased to do so.    Sincerely,    Cambryn Charters K. Posey Pronto, DO

## 2018-04-23 NOTE — Patient Instructions (Addendum)
Increase primidone to 50mg  twice daily.  Call my office in 1 month to let me know how you are doing.   One week later, you can start flexeril 5mg  at bedtime for headaches  Referral to physical therapy for gait training  You have been referred for a neurocognitive evaluation in our office.   The evaluation takes approximately two hours. The first part of the appointment is a clinical interview with the neuropsychologist (Dr. Macarthur Critchley). Please bring someone with you to this appointment if possible, as it is helpful for Dr. Si Raider to hear from both you and another adult who knows you well. After speaking with Dr. Si Raider, you will complete testing with her technician. The testing includes a variety of tasks- mostly question-and-answer, some paper-and-pencil. There is nothing you need to do to prepare for this appointment, but having a good night's sleep prior to the testing, and bringing eyeglasses and hearing aids (if you wear them), is advised.   About a week after the evaluation, you will return to follow up with Dr. Si Raider to review the test results. This appointment is about 30 minutes. If you would like a family member to receive this information as well, please bring them to the appointment.   We have to reserve several hours of the neuropsychologist's time and the psychometrician's time for your evaluation appointment. As such, please note that there is a No-Show fee of $100. If you are unable to attend any of your appointments, please contact our office as soon as possible to reschedule.

## 2018-04-23 NOTE — Telephone Encounter (Signed)
Patient called and says "I fell last Saturday and hurt my left leg. I called EMS and they came and bandaged it for me. I haven been taking care of it and putting neosporin on it. I went to see my neurologist today and he looked at it. He said it was healing, but I need to let my primary doctor see it, since I am a diabetic." I asked what does it look like, he says "I can't really describe it, but it is healing. It hurts sometime, but not that bad." I asked did he see redness, he says "no, not really." I advised he will need to be seen and he says he is not available this week, because he has several appointments and his brother will have to come drive him. He says next week is fine. I advised that Dr. Carollee Herter is on vacation next week, he says he will see someone else. Appointment scheduled for Monday, July 1 at 1120 with Debbrah Alar, NP, patient verbalized understanding.

## 2018-04-25 ENCOUNTER — Emergency Department (HOSPITAL_COMMUNITY)
Admission: EM | Admit: 2018-04-25 | Discharge: 2018-04-25 | Disposition: A | Discharge status: Home / Self Care | Payer: Medicare Other | Attending: Emergency Medicine | Admitting: Emergency Medicine

## 2018-04-25 ENCOUNTER — Encounter (HOSPITAL_COMMUNITY): Payer: Self-pay | Admitting: Emergency Medicine

## 2018-04-25 ENCOUNTER — Emergency Department (HOSPITAL_COMMUNITY): Payer: Medicare Other

## 2018-04-25 ENCOUNTER — Other Ambulatory Visit: Payer: Self-pay

## 2018-04-25 ENCOUNTER — Emergency Department (HOSPITAL_BASED_OUTPATIENT_CLINIC_OR_DEPARTMENT_OTHER): Payer: Medicare Other

## 2018-04-25 DIAGNOSIS — Z8673 Personal history of transient ischemic attack (TIA), and cerebral infarction without residual deficits: Secondary | ICD-10-CM | POA: Diagnosis not present

## 2018-04-25 DIAGNOSIS — I2510 Atherosclerotic heart disease of native coronary artery without angina pectoris: Secondary | ICD-10-CM | POA: Diagnosis not present

## 2018-04-25 DIAGNOSIS — L03116 Cellulitis of left lower limb: Secondary | ICD-10-CM | POA: Insufficient documentation

## 2018-04-25 DIAGNOSIS — S81802A Unspecified open wound, left lower leg, initial encounter: Secondary | ICD-10-CM | POA: Diagnosis not present

## 2018-04-25 DIAGNOSIS — Z951 Presence of aortocoronary bypass graft: Secondary | ICD-10-CM | POA: Insufficient documentation

## 2018-04-25 DIAGNOSIS — M7989 Other specified soft tissue disorders: Secondary | ICD-10-CM

## 2018-04-25 DIAGNOSIS — I5032 Chronic diastolic (congestive) heart failure: Secondary | ICD-10-CM | POA: Diagnosis not present

## 2018-04-25 DIAGNOSIS — I130 Hypertensive heart and chronic kidney disease with heart failure and stage 1 through stage 4 chronic kidney disease, or unspecified chronic kidney disease: Secondary | ICD-10-CM | POA: Diagnosis not present

## 2018-04-25 DIAGNOSIS — Z794 Long term (current) use of insulin: Secondary | ICD-10-CM | POA: Insufficient documentation

## 2018-04-25 DIAGNOSIS — Z7982 Long term (current) use of aspirin: Secondary | ICD-10-CM | POA: Diagnosis not present

## 2018-04-25 DIAGNOSIS — N183 Chronic kidney disease, stage 3 (moderate): Secondary | ICD-10-CM | POA: Insufficient documentation

## 2018-04-25 DIAGNOSIS — Z7902 Long term (current) use of antithrombotics/antiplatelets: Secondary | ICD-10-CM | POA: Diagnosis not present

## 2018-04-25 DIAGNOSIS — E1122 Type 2 diabetes mellitus with diabetic chronic kidney disease: Secondary | ICD-10-CM | POA: Insufficient documentation

## 2018-04-25 DIAGNOSIS — R609 Edema, unspecified: Secondary | ICD-10-CM

## 2018-04-25 DIAGNOSIS — Z79899 Other long term (current) drug therapy: Secondary | ICD-10-CM | POA: Diagnosis not present

## 2018-04-25 DIAGNOSIS — M79605 Pain in left leg: Secondary | ICD-10-CM | POA: Diagnosis present

## 2018-04-25 LAB — CBC WITH DIFFERENTIAL/PLATELET
Abs Immature Granulocytes: 0 10*3/uL (ref 0.0–0.1)
Basophils Absolute: 0 10*3/uL (ref 0.0–0.1)
Basophils Relative: 0 %
EOS PCT: 3 %
Eosinophils Absolute: 0.2 10*3/uL (ref 0.0–0.7)
HEMATOCRIT: 39.9 % (ref 39.0–52.0)
HEMOGLOBIN: 12.6 g/dL — AB (ref 13.0–17.0)
Immature Granulocytes: 0 %
LYMPHS ABS: 1.2 10*3/uL (ref 0.7–4.0)
LYMPHS PCT: 20 %
MCH: 27.9 pg (ref 26.0–34.0)
MCHC: 31.6 g/dL (ref 30.0–36.0)
MCV: 88.3 fL (ref 78.0–100.0)
MONOS PCT: 9 %
Monocytes Absolute: 0.6 10*3/uL (ref 0.1–1.0)
Neutro Abs: 4.1 10*3/uL (ref 1.7–7.7)
Neutrophils Relative %: 68 %
Platelets: 257 10*3/uL (ref 150–400)
RBC: 4.52 MIL/uL (ref 4.22–5.81)
RDW: 13.1 % (ref 11.5–15.5)
WBC: 6.1 10*3/uL (ref 4.0–10.5)

## 2018-04-25 LAB — COMPREHENSIVE METABOLIC PANEL
ALK PHOS: 93 U/L (ref 38–126)
ALT: 16 U/L (ref 0–44)
ANION GAP: 8 (ref 5–15)
AST: 19 U/L (ref 15–41)
Albumin: 3.2 g/dL — ABNORMAL LOW (ref 3.5–5.0)
BUN: 13 mg/dL (ref 8–23)
CALCIUM: 8.8 mg/dL — AB (ref 8.9–10.3)
CO2: 27 mmol/L (ref 22–32)
Chloride: 103 mmol/L (ref 98–111)
Creatinine, Ser: 1.19 mg/dL (ref 0.61–1.24)
GFR calc Af Amer: >60 mL/min (ref >60)
GFR, EST NON AFRICAN AMERICAN: 58 mL/min — AB (ref >60)
GLUCOSE: 339 mg/dL — AB (ref 70–99)
Potassium: 3.4 mmol/L — ABNORMAL LOW (ref 3.5–5.1)
Sodium: 138 mmol/L (ref 135–145)
Total Bilirubin: 0.7 mg/dL (ref 0.3–1.2)
Total Protein: 6.3 g/dL — ABNORMAL LOW (ref 6.5–8.1)

## 2018-04-25 LAB — I-STAT CG4 LACTIC ACID, ED: LACTIC ACID, VENOUS: 1.87 mmol/L (ref 0.5–1.9)

## 2018-04-25 MED ORDER — DOXYCYCLINE HYCLATE 100 MG PO TABS
100.0000 mg | ORAL_TABLET | Freq: Once | ORAL | Status: AC
  Administered 2018-04-25: 100 mg via ORAL
  Filled 2018-04-25: qty 1

## 2018-04-25 MED ORDER — DOXYCYCLINE HYCLATE 100 MG PO CAPS: 100.0000 mg | ORAL_CAPSULE | Freq: Two times a day (BID) | ORAL | 0 refills | Status: DC

## 2018-04-25 MED ORDER — ACETAMINOPHEN 325 MG PO TABS
650.0000 mg | ORAL_TABLET | Freq: Once | ORAL | Status: AC
  Administered 2018-04-25: 650 mg via ORAL
  Filled 2018-04-25: qty 2

## 2018-04-25 NOTE — ED Notes (Signed)
ED Provider at bedside. 

## 2018-04-25 NOTE — ED Notes (Signed)
Pt in vascular.

## 2018-04-25 NOTE — Progress Notes (Signed)
*  PRELIMINARY RESULTS* Vascular Ultrasound Left Lower Extremity Venous Study has been completed.  Preliminary findings: There is no evidence of deep vein thrombosis or bakers cyst in the left lower extremity.   Darlina Sicilian M 04/25/2018, 1:16 PM

## 2018-04-25 NOTE — ED Provider Notes (Signed)
[facility] EMERGENCY DEPARTMENT Provider Note   CSN: [id number] Arrival date & time: 04/25/18  1037     History   Chief Complaint Chief Complaint  Patient presents with  . Fall  . Leg Injury    HPI Edward [name] is a 77 y.o. male.  HPI  77 year old male presents with left leg pain and swelling.  Patient has a history of CHF and diabetes.  About 8-10 days ago he missed a step and hit his left lower externally on a step.  Has been having increased swelling and pain over the last 3 or 4 days.  Has noticed yellow drainage.  He has had some subjective fever and chills on and off over the last couple days.  The pain is worsening and around a 9 out of 10 at this time.  No weakness or numbness.  He is been wearing a heart monitor due to intermittent palpitations.  However he denies any new chest pain or shortness of breath.  Past Medical History:  Diagnosis Date  . Adenomatous colon polyp 04/2011  . CAD (coronary artery disease)    a. 01/2015 DES to LAD  b. 12/2015: [place] 85% oLCx lesion--> rx therapy.  . Chronic diastolic CHF (congestive heart failure) ([place])    a. 05/2017 Echo: EF 60-65%, Gr1 DD, no rwma, mildly dil LA, nl RV fxn, mild TR.  . CKD (chronic kidney disease), stage II    a. probable CKD II-III with baseline CR 1.1-1.3.  . DM type 2 (diabetes mellitus, type 2), insulin dependent 01/29/2014   fasting cbg 50-120 with new regimen  . Dyslipidemia, goal LDL below 70 01/29/2014  . Enlarged prostate   . Essential tremor    a. on proprnolol  . GERD (gastroesophageal reflux disease)   . Hypertension   . Internal hemorrhoid   . Sleep apnea    does not use cpap (06/04/2015)  . TIA (transient ischemic attack) 2002    Patient Active Problem List   Diagnosis Date Noted  . Chronic daily headache 04/23/2018  . Palpitations 03/28/2018  . Acute on chronic diastolic (congestive) heart failure ([place]) 03/08/2018  . Anxiety 07/24/2017  . Atelectasis   . Coronary artery  disease involving coronary bypass graft of native heart with angina pectoris ([place])   . Hypertensive heart disease without heart failure   . Chronic kidney disease, stage III (moderate) ([place]) 11/03/2016  . Chronic diastolic heart failure ([place])   . Near syncope 04/19/2016  . Diabetes mellitus ([place]) 03/11/2016  . GERD (gastroesophageal reflux disease) 08/27/2015  . Acute bronchitis 08/27/2015  . SOB (shortness of breath) 07/08/2015  . Obstructive sleep apnea 06/22/2015  . Bronchospasm 06/22/2015  . Essential tremor 04/23/2015  . CAP (community acquired pneumonia) 04/16/2014  . Hyperlipidemia LDL goal <70 01/29/2014  . Essential hypertension 01/29/2014    Past Surgical History:  Procedure Laterality Date  . BACK SURGERY    . CARDIAC CATHETERIZATION  01/28/14   + CAD treat medically  . CARDIAC CATHETERIZATION N/A 12/21/2015   Procedure: Right/Left Heart Cath and Coronary Angiography;  Surgeon: Belva Crome, MD;  Location: [place] CV LAB;  Service: Cardiovascular;  Laterality: N/A;  . CARDIAC CATHETERIZATION N/A 11/02/2016   Procedure: Left Heart Cath and Coronary Angiography;  Surgeon: Nelva Bush, MD;  Location: [place] CV [facility];  Service: Cardiovascular;  Laterality: N/A;  . CATARACT EXTRACTION Bilateral   . CATARACT EXTRACTION, BILATERAL Bilateral   . CORONARY ANGIOPLASTY WITH STENT PLACEMENT  01/30/2015  DES [place] to LAD by Dr Tamala Julian  . CORONARY ARTERY BYPASS GRAFT N/A 11/09/2016   Procedure: CORONARY ARTERY BYPASS GRAFTING (CABG) x 2;  Surgeon: Melrose Nakayama, MD;  Location: [facility];  Service: Open Heart Surgery;  Laterality: N/A;  . ENDOVEIN HARVEST OF GREATER SAPHENOUS VEIN Left 11/09/2016   Procedure: ENDOVEIN HARVEST OF GREATER SAPHENOUS VEIN;  Surgeon: Melrose Nakayama, MD;  Location: [place];  Service: Open Heart Surgery;  Laterality: Left;  . JOINT REPLACEMENT    . LEFT HEART CATH AND CORS/GRAFTS ANGIOGRAPHY N/A 03/09/2018   Procedure: LEFT HEART CATH  AND CORS/GRAFTS ANGIOGRAPHY;  Surgeon: Belva Crome, MD;  Location: [place] CV LAB;  Service: Cardiovascular;  Laterality: N/A;  . LEFT HEART CATHETERIZATION WITH CORONARY ANGIOGRAM N/A 01/28/2014   Procedure: LEFT HEART CATHETERIZATION WITH CORONARY ANGIOGRAM;  Surgeon: Sinclair Grooms, MD;  Location: [facility] CATH LAB;  Service: Cardiovascular;  Laterality: N/A;  . LEFT HEART CATHETERIZATION WITH CORONARY ANGIOGRAM N/A 01/30/2015   Procedure: LEFT HEART CATHETERIZATION WITH CORONARY ANGIOGRAM;  Surgeon: Belva Crome, MD;  Location: [facility] CATH LAB;  Service: Cardiovascular;  Laterality: N/A;  . LUMBAR [place]    . SHOULDER ARTHROSCOPY Right 07/30/2014   Procedure: Right Shoulder Arthroscopy, Debridement, Decompression, Manipulation Under Anesthesia;  Surgeon: Newt Minion, MD;  Location: [facility];  Service: Orthopedics;  Laterality: Right;  . TEE WITHOUT CARDIOVERSION N/A 11/09/2016   Procedure: TRANSESOPHAGEAL ECHOCARDIOGRAM (TEE);  Surgeon: Melrose Nakayama, MD;  Location: [place];  Service: Open Heart Surgery;  Laterality: N/A;  . TOTAL KNEE ARTHROPLASTY Bilateral         Home Medications    Prior to Admission medications   Medication Sig Start Date End Date Taking? Authorizing Provider  acetaminophen (TYLENOL) 500 MG tablet Take 2 tablets (1,000 mg total) by mouth every 6 (six) hours as needed. 12/07/16   Leo Grosser, MD  albuterol (VENTOLIN HFA) 108 (90 Base) MCG/ACT inhaler inhale 2 puffs by mouth every 6 hours if needed for wheezing or shortness of breath 07/24/17   Carollee Herter, Alferd Apa, DO  amLODipine (NORVASC) 10 MG tablet Take 1 tablet (10 mg total) by mouth daily. 03/13/18   Ann Held, DO  aspirin EC 81 MG tablet Take 81 mg by mouth daily.    [provider]  bismuth subsalicylate (PEPTO BISMOL) 262 MG/15ML suspension Take 30 mLs by mouth as needed for indigestion.    [provider]  Ciclopirox 1 % shampoo Apply 1 application topically See admin  instructions. Massage in scalp three times weekly and leave on for 10 min. Then rinse.    [provider]  clonazePAM (KLONOPIN) 1 MG tablet Take 1 tablet (1 mg total) by mouth at bedtime. 03/13/18   Ann Held, DO  clopidogrel (PLAVIX) 75 MG tablet Take 1 tablet (75 mg total) by mouth daily. 03/13/18   Ann Held, DO  cyclobenzaprine (FLEXERIL) 5 MG tablet Take 1 tablet (5 mg total) by mouth at bedtime as needed for muscle spasms (headache). 04/23/18   Narda Amber K, DO  diclofenac sodium (VOLTAREN) 1 % GEL Apply 2 g topically 4 (four) times daily as needed (for pain).     [provider]  doxycycline (VIBRAMYCIN) 100 MG capsule Take 1 capsule (100 mg total) by mouth 2 (two) times daily. One po bid x 7 days 04/25/18   Sherwood Gambler, MD  furosemide (LASIX) 40 MG tablet Take 1.5  tablets (60 mg total) by mouth daily. 03/29/18 06/27/18  Ledora Bottcher, PA  gabapentin (NEURONTIN) 300 MG capsule take 1 capsule by mouth at bedtime for 1 week then INCREASE TO 1 CAP TWICE DAILY 03/13/18   Ann Held, DO  hydrALAZINE (APRESOLINE) 25 MG tablet Take 1 tablet (25 mg total) by mouth 3 (three) times daily. 03/28/18 06/26/18  Duke, Tami Lin, PA  insulin NPH Human (HUMULIN N) 100 UNIT/ML injection Inject under skin 30 units 2x a day - ReliOn Patient taking differently: Inject 40 Units into the skin 2 (two) times daily before a meal. Inject under skin 30 units 2x a day - ReliOn 11/06/17   Philemon Kingdom, MD  insulin regular (NOVOLIN R) 100 units/mL injection Inject 0.2 mLs (20 Units total) into the skin 3 (three) times daily before meals. 03/15/18   Roma Schanz R, DO  Insulin Syringe-Needle U-100 (BD INSULIN SYRINGE U/F) 31G X 5/16" 1 ML MISC Use 4 times per day to inject insulin. 01/16/18   Philemon Kingdom, MD  lisinopril (PRINIVIL,ZESTRIL) 20 MG tablet Take 20 mg by mouth daily.    [provider]  meclizine (ANTIVERT) 25 MG tablet Take 1  tablet (25 mg total) by mouth 2 (two) times daily as needed for dizziness. 06/10/17   Theora Gianotti, NP  nitroGLYCERIN (NITROSTAT) 0.4 MG SL tablet Place 1 tablet (0.4 mg total) under the tongue every 5 (five) minutes as needed for chest pain. Max: 3 doses per episode. 11/07/17   Burtis Junes, NP  pantoprazole (PROTONIX) 40 MG tablet Take 1 tablet (40 mg total) by mouth daily. 03/13/18   Ann Held, DO  Polyvinyl Alcohol-Povidone (REFRESH OP) Place 1 drop into both eyes 3 (three) times daily as needed (for dry eyes). Reported on 12/25/2015    [provider]  potassium chloride SA (K-DUR,KLOR-CON) 20 MEQ tablet Take 1.5 tablets (30 mEq total) by mouth daily. 06/10/17   Theora Gianotti, NP  primidone (MYSOLINE) 50 MG tablet Take 1 tablet (50 mg total) by mouth 2 (two) times daily. 04/23/18   Patel, Arvin Collard K, DO  propranolol (INDERAL) 40 MG tablet Take 1 tablet (40 mg total) by mouth 3 (three) times daily. 03/13/18   Roma Schanz R, DO  psyllium (METAMUCIL) 58.6 % packet Take 1 packet by mouth daily as needed (constipation).    [provider]  rosuvastatin (CRESTOR) 20 MG tablet Take 1 tablet (20 mg total) by mouth daily. 03/13/18 03/08/19  Ann Held, DO    Family History Family History  Problem Relation Age of Onset  . CAD Brother        2 brothers - CABG  . Alzheimer's disease Sister   . CAD Sister   . Prostate cancer Brother   . Asthma Brother        2 brothers   . Diabetes Other        entire family  . Colon cancer Neg Hx     Social History Social History   Tobacco Use  . Smoking status: Never Smoker  . Smokeless tobacco: Never Used  Substance Use Topics  . Alcohol use: No    Alcohol/week: 0.0 oz  . Drug use: No     Allergies   Pneumococcal vaccines   Review of Systems Review of Systems  Constitutional: Positive for chills and fever.  Respiratory: Negative for shortness of breath.   Cardiovascular:  Positive for palpitations (currently wearing heart monitor)  and leg swelling. Negative for chest pain.  Gastrointestinal: Negative for vomiting.  Musculoskeletal: Positive for myalgias.  Skin: Positive for color change and wound.  Neurological: Negative for weakness and numbness.  All other systems reviewed and are negative.    Physical Exam Updated Vital Signs BP (!) 134/44   Pulse 74   Temp 98.2 F (36.8 C) (Oral)   Resp 16   SpO2 100%   Physical Exam  Constitutional: He is oriented to person, place, and time. He appears well-developed and well-nourished.  HENT:  Head: Normocephalic and atraumatic.  Right Ear: External ear normal.  Left Ear: External ear normal.  Nose: Nose normal.  Eyes: Right eye exhibits no discharge. Left eye exhibits no discharge.  Neck: Neck supple.  Cardiovascular: Normal rate, regular rhythm and normal heart sounds.  Pulses:      Dorsalis pedis pulses are 2+ on the left side.  Pulmonary/Chest: Effort normal and breath sounds normal.  Abdominal: He exhibits no distension.  Musculoskeletal:  See picture.  Diffuse swelling with anterior tenderness and redness.  2 abrasions that appear infected.  No significant calf tenderness.  Neurological: He is alert and oriented to person, place, and time.  Skin: Skin is warm and dry.  Nursing note and vitals reviewed.      ED Treatments / Results  Labs (all labs ordered are listed, but only abnormal results are displayed) Labs Reviewed  CBC WITH DIFFERENTIAL/PLATELET - Abnormal; Notable for the following components:      Result Value   Hemoglobin 12.6 (*)    All other components within normal limits  COMPREHENSIVE METABOLIC PANEL - Abnormal; Notable for the following components:   Potassium 3.4 (*)    Glucose, Bld 339 (*)    Calcium 8.8 (*)    Total Protein 6.3 (*)    Albumin 3.2 (*)    GFR calc non Af Amer 58 (*)    All other components within normal limits  I-STAT CG4 LACTIC ACID, ED     EKG None  Radiology Dg Tibia/fibula Left  Result Date: 04/25/2018 CLINICAL DATA:  Open wound of left lower leg. EXAM: LEFT TIBIA AND FIBULA - 2 VIEW COMPARISON:  None. FINDINGS: There is no evidence of fracture or other focal bone lesions. Soft tissues are unremarkable. IMPRESSION: Normal left tibia and fibula. Electronically Signed   By: Marijo Conception, M.D.   On: 04/25/2018 11:42    Procedures Procedures (including critical care time)  *PRELIMINARY RESULTS* Vascular Ultrasound Left Lower Extremity Venous Study has been completed.  Preliminary findings: There is no evidence of deep vein thrombosis or bakers cyst in the left lower extremity.   Darlina Sicilian M 04/25/2018, 1:16 PM    Medications Ordered in ED Medications  doxycycline (VIBRA-TABS) tablet 100 mg (100 mg Oral Given 04/25/18 1219)  acetaminophen (TYLENOL) tablet 650 mg (650 mg Oral Given 04/25/18 1219)     Initial Impression / Assessment and Plan / ED Course  I have reviewed the triage vital signs and the nursing notes.  Pertinent labs & imaging results that were available during my care of the patient were reviewed by me and considered in my medical decision making (see chart for details).     Patient appears to have left leg cellulitis.  DVT ultrasound is negative.  Systemically he has a normal WBC and benign vital signs.  He is afebrile.  I do not think this warrants sepsis or needing acute inpatient treatment.  I will cover broadly with doxycycline  given his diabetes history.  He otherwise appears stable for discharge home and pain control with Tylenol.  We discussed return precautions.  He already has follow-up with PCP in about 5 days.  Final Clinical Impressions(s) / ED Diagnoses   Final diagnoses:  Left leg cellulitis    ED Discharge Orders        Ordered    doxycycline (VIBRAMYCIN) 100 MG capsule  2 times daily     04/25/18 1414       Sherwood Gambler, MD 04/25/18 1526

## 2018-04-25 NOTE — Discharge Instructions (Signed)
If your leg swelling or pain worsens or you develop worsening redness or streaking up your leg, fevers, vomiting, or any other new/concerning symptoms then return to the ER for evaluation.  Otherwise take the antibodies as instructed and follow-up with your primary care physician.

## 2018-04-25 NOTE — ED Triage Notes (Signed)
pT states fell down steps  10 days ago and skinned left leg , now leg is swollen , tight, shiny and tender to touch and painful, has hx of surgery on that knee and  Hx of bypass is on plavix

## 2018-04-26 DIAGNOSIS — M79671 Pain in right foot: Secondary | ICD-10-CM | POA: Diagnosis not present

## 2018-04-26 DIAGNOSIS — I872 Venous insufficiency (chronic) (peripheral): Secondary | ICD-10-CM | POA: Diagnosis not present

## 2018-04-26 DIAGNOSIS — E1151 Type 2 diabetes mellitus with diabetic peripheral angiopathy without gangrene: Secondary | ICD-10-CM | POA: Diagnosis not present

## 2018-04-26 DIAGNOSIS — M79672 Pain in left foot: Secondary | ICD-10-CM | POA: Diagnosis not present

## 2018-04-26 DIAGNOSIS — B351 Tinea unguium: Secondary | ICD-10-CM | POA: Diagnosis not present

## 2018-04-26 DIAGNOSIS — R609 Edema, unspecified: Secondary | ICD-10-CM | POA: Diagnosis not present

## 2018-04-27 ENCOUNTER — Other Ambulatory Visit: Payer: Self-pay | Admitting: Nurse Practitioner

## 2018-04-27 DIAGNOSIS — E876 Hypokalemia: Secondary | ICD-10-CM

## 2018-04-30 ENCOUNTER — Ambulatory Visit: Payer: Medicare Other | Admitting: Family

## 2018-05-02 ENCOUNTER — Ambulatory Visit: Payer: Medicare Other | Admitting: Neurology

## 2018-05-02 ENCOUNTER — Encounter

## 2018-05-04 DIAGNOSIS — L97221 Non-pressure chronic ulcer of left calf limited to breakdown of skin: Secondary | ICD-10-CM | POA: Diagnosis not present

## 2018-05-04 DIAGNOSIS — M79672 Pain in left foot: Secondary | ICD-10-CM | POA: Diagnosis not present

## 2018-05-04 DIAGNOSIS — E1151 Type 2 diabetes mellitus with diabetic peripheral angiopathy without gangrene: Secondary | ICD-10-CM | POA: Diagnosis not present

## 2018-05-04 DIAGNOSIS — I83222 Varicose veins of left lower extremity with both ulcer of calf and inflammation: Secondary | ICD-10-CM | POA: Diagnosis not present

## 2018-05-07 ENCOUNTER — Ambulatory Visit: Payer: Medicare Other | Admitting: Family Medicine

## 2018-05-09 NOTE — Progress Notes (Signed)
Cardiology Office Note   Date:  05/10/2018   ID:  Edward [name], DOB [birth year], MRN [id number]  PCP:  Carollee Herter, Alferd Apa, DO  Cardiologist:  [place]     Chief Complaint  Patient presents with  . Tachycardia      History of Present Illness: Edward [name] is a 77 y.o. male who presents for rapid HR and post ER visit.    Pt with a hx of CAD s/p CABG 0/2774, chronic diastolic heart failure, HTN, HLD, DM2, CKD stage III, and OSA. He presented to [facility] 03/08/18 with chest pain and dyspnea. He was given IV diuresis in the ER and cardiology was consulted. Troponin remained negative and BNP mildly elevated at 100. He had ongoing chest pain and repeat cardiac catheterization was performed. LHC showed 60% proximal LAD stenosis, widely patent mid LAD stent, paient LM, and totally occlulded ostial Cx to proximal Cx. He had patent SVG-OM2 and patent LIMA-dLAD. No intervention was made. Etiologies other than cardiac were suggested as the source of his chest discomfort.   He reported back to the [facility] on 03/24/18 with an episode of rapid heart rate.  He reported feeling heart fluttering in his chest that scared him. He denied near-syncope and syncope. EKG with sinus with 1st degree heart block and PACs. He was observed and deemed stable for discharge.  Heart monitor was placed but no afib and no arrhythmias except for PACs.   Was seen in ER again 04/25/18 after a fall and leg injury he had developed chills and fevers and increase pain.  No DVT.  Placed on doxycycline.  No syncope he tripped on steps.  Today his leg is improving.  He has no chest pain or SOB.  Mild lower ext edema but is on lasix.      Past Medical History:  Diagnosis Date  . Adenomatous colon polyp 04/2011  . CAD (coronary artery disease)    a. 01/2015 DES to LAD  b. 12/2015: [place] 85% oLCx lesion--> rx therapy.  . Chronic diastolic CHF (congestive heart failure) ([place])    a. 05/2017 Echo: EF 60-65%, Gr1 DD, no rwma, mildly dil LA, nl  RV fxn, mild TR.  . CKD (chronic kidney disease), stage II    a. probable CKD II-III with baseline CR 1.1-1.3.  . DM type 2 (diabetes mellitus, type 2), insulin dependent 01/29/2014   fasting cbg 50-120 with new regimen  . Dyslipidemia, goal LDL below 70 01/29/2014  . Enlarged prostate   . Essential tremor    a. on proprnolol  . GERD (gastroesophageal reflux disease)   . Hypertension   . Internal hemorrhoid   . Sleep apnea    does not use cpap (06/04/2015)  . TIA (transient ischemic attack) 2002    Past Surgical History:  Procedure Laterality Date  . BACK SURGERY    . CARDIAC CATHETERIZATION  01/28/14   + CAD treat medically  . CARDIAC CATHETERIZATION N/A 12/21/2015   Procedure: Right/Left Heart Cath and Coronary Angiography;  Surgeon: Belva Crome, MD;  Location: [place] CV [facility];  Service: Cardiovascular;  Laterality: N/A;  . CARDIAC CATHETERIZATION N/A 11/02/2016   Procedure: Left Heart Cath and Coronary Angiography;  Surgeon: Nelva Bush, MD;  Location: [place] [facility];  Service: Cardiovascular;  Laterality: N/A;  . CATARACT EXTRACTION Bilateral   . CATARACT EXTRACTION, BILATERAL Bilateral   . CORONARY ANGIOPLASTY WITH STENT PLACEMENT  01/30/2015   DES [place]  [place] to LAD by Dr  Smith  . CORONARY ARTERY BYPASS GRAFT N/A 11/09/2016   Procedure: CORONARY ARTERY BYPASS GRAFTING (CABG) x 2;  Surgeon: Melrose Nakayama, MD;  Location: [place];  Service: Open Heart Surgery;  Laterality: N/A;  . ENDOVEIN HARVEST OF GREATER SAPHENOUS VEIN Left 11/09/2016   Procedure: ENDOVEIN HARVEST OF GREATER SAPHENOUS VEIN;  Surgeon: Melrose Nakayama, MD;  Location: [place];  Service: Open Heart Surgery;  Laterality: Left;  . JOINT REPLACEMENT    . LEFT HEART CATH AND CORS/GRAFTS ANGIOGRAPHY N/A 03/09/2018   Procedure: LEFT HEART CATH AND CORS/GRAFTS ANGIOGRAPHY;  Surgeon: Belva Crome, MD;  Location: [place] [facility];  Service: Cardiovascular;  Laterality: N/A;  . LEFT HEART CATHETERIZATION  WITH CORONARY ANGIOGRAM N/A 01/28/2014   Procedure: LEFT HEART CATHETERIZATION WITH CORONARY ANGIOGRAM;  Surgeon: Sinclair Grooms, MD;  Location: [facility] CATH LAB;  Service: Cardiovascular;  Laterality: N/A;  . LEFT HEART CATHETERIZATION WITH CORONARY ANGIOGRAM N/A 01/30/2015   Procedure: LEFT HEART CATHETERIZATION WITH CORONARY ANGIOGRAM;  Surgeon: Belva Crome, MD;  Location: [facility] CATH LAB;  Service: Cardiovascular;  Laterality: N/A;  . LUMBAR [place]    . SHOULDER ARTHROSCOPY Right 07/30/2014   Procedure: Right Shoulder Arthroscopy, Debridement, Decompression, Manipulation Under Anesthesia;  Surgeon: Newt Minion, MD;  Location: [place];  Service: Orthopedics;  Laterality: Right;  . TEE WITHOUT CARDIOVERSION N/A 11/09/2016   Procedure: TRANSESOPHAGEAL ECHOCARDIOGRAM (TEE);  Surgeon: Melrose Nakayama, MD;  Location: [place];  Service: Open Heart Surgery;  Laterality: N/A;  . TOTAL KNEE ARTHROPLASTY Bilateral      Current Outpatient Medications  Medication Sig Dispense Refill  . acetaminophen (TYLENOL) 500 MG tablet Take 2 tablets (1,000 mg total) by mouth every 6 (six) hours as needed. 30 tablet 0  . albuterol (VENTOLIN HFA) 108 (90 Base) MCG/ACT inhaler inhale 2 puffs by mouth every 6 hours if needed for wheezing or shortness of breath 18 g 1  . amLODipine (NORVASC) 10 MG tablet Take 1 tablet (10 mg total) by mouth daily. 90 tablet 1  . aspirin EC 81 MG tablet Take 81 mg by mouth daily.    Marland Kitchen bismuth subsalicylate (PEPTO BISMOL) 262 MG/15ML suspension Take 30 mLs by mouth as needed for indigestion.    . Ciclopirox 1 % shampoo Apply 1 application topically See admin instructions. Massage in scalp three times weekly and leave on for 10 min. Then rinse.    . clonazePAM (KLONOPIN) 1 MG tablet Take 1 tablet (1 mg total) by mouth at bedtime. 30 tablet 1  . clopidogrel (PLAVIX) 75 MG tablet Take 1 tablet (75 mg total) by mouth daily. 90 tablet 3  . cyclobenzaprine (FLEXERIL) 5 MG tablet Take 1 tablet  (5 mg total) by mouth at bedtime as needed for muscle spasms (headache). 30 tablet 1  . diclofenac sodium (VOLTAREN) 1 % GEL Apply 2 g topically 4 (four) times daily as needed (for pain).     . furosemide (LASIX) 40 MG tablet Take 1.5 tablets (60 mg total) by mouth daily. 135 tablet 3  . gabapentin (NEURONTIN) 300 MG capsule take 1 capsule by mouth at bedtime for 1 week then INCREASE TO 1 CAP TWICE DAILY 60 capsule 11  . hydrALAZINE (APRESOLINE) 25 MG tablet Take 1 tablet (25 mg total) by mouth 3 (three) times daily. 270 tablet 3  . insulin NPH Human (HUMULIN N) 100 UNIT/ML injection Inject under skin 30 units 2x a day - ReliOn (Patient taking differently: Inject 40 Units  into the skin 2 (two) times daily before a meal. Inject under skin 30 units 2x a day - ReliOn) 20 mL 5  . insulin regular (NOVOLIN R) 100 units/mL injection Inject 0.2 mLs (20 Units total) into the skin 3 (three) times daily before meals. 30 mL 1  . Insulin Syringe-Needle U-100 (BD INSULIN SYRINGE U/F) 31G X 5/16" 1 ML MISC Use 4 times per day to inject insulin. 300 each 5  . lisinopril (PRINIVIL,ZESTRIL) 20 MG tablet Take 20 mg by mouth daily.    . meclizine (ANTIVERT) 25 MG tablet Take 1 tablet (25 mg total) by mouth 2 (two) times daily as needed for dizziness. 30 tablet 1  . nitroGLYCERIN (NITROSTAT) 0.4 MG SL tablet Place 1 tablet (0.4 mg total) under the tongue every 5 (five) minutes as needed for chest pain. Max: 3 doses per episode. 25 tablet 3  . pantoprazole (PROTONIX) 40 MG tablet Take 1 tablet (40 mg total) by mouth daily. 90 tablet 3  . Polyvinyl Alcohol-Povidone (REFRESH OP) Place 1 drop into both eyes 3 (three) times daily as needed (for dry eyes). Reported on 12/25/2015    . potassium chloride SA (K-DUR,KLOR-CON) 20 MEQ tablet TAKE 1 & 1/2 (ONE & ONE-HALF) TABLETS BY MOUTH ONCE DAILY 135 tablet 3  . primidone (MYSOLINE) 50 MG tablet Take 1 tablet (50 mg total) by mouth 2 (two) times daily. 60 tablet 11  . propranolol  (INDERAL) 40 MG tablet Take 1 tablet (40 mg total) by mouth 3 (three) times daily. 270 tablet 3  . psyllium (METAMUCIL) 58.6 % packet Take 1 packet by mouth daily as needed (constipation).    . rosuvastatin (CRESTOR) 20 MG tablet Take 1 tablet (20 mg total) by mouth daily. (Patient taking differently: Take 20 mg by mouth 2 (two) times a week. ) 90 tablet 3   No current facility-administered medications for this visit.     Allergies:   Pneumococcal vaccines    Social History:  The patient  reports that he has never smoked. He has never used smokeless tobacco. He reports that he does not drink alcohol or use drugs.   Family History:  The patient's family history includes Alzheimer's disease in his sister; Asthma in his brother; CAD in his brother and sister; Diabetes in his other; Prostate cancer in his brother.    ROS:  General:no colds or fevers, + weight loss Skin:no rashes + ulcer/abrasion on Lt lower ext HEENT:no blurred vision, no congestion CV:see HPI PUL:see HPI GI:no diarrhea constipation or melena, no indigestion GU:no hematuria, no dysuria MS:no joint pain, no claudication Neuro:no syncope, no lightheadedness Endo:+ diabetes, no thyroid disease  Wt Readings from Last 3 Encounters:  05/10/18 251 lb (113.9 kg)  04/23/18 280 lb 6 oz (127.2 kg)  03/28/18 284 lb (128.8 kg)     PHYSICAL EXAM: VS:  BP 124/62   Pulse 77   Ht 5\' 8"  (1.727 m)   Wt 251 lb (113.9 kg)   SpO2 97%   BMI 38.16 kg/m  , BMI Body mass index is 38.16 kg/m. General:Pleasant affect, NAD Skin:Warm and dry, brisk capillary refill HEENT:normocephalic, sclera clear, mucus membranes moist Neck:supple, no JVD, no bruits  Heart:S1S2 RRR without murmur, gallup, rub or click Lungs:clear without rales, rhonchi, or wheezes XBM:WUXL, non tender, + BS, do not palpate liver spleen or masses Ext:Tr lower ext edema Rt lower ext with healing abrasion, pink granulation, pt has a brace on the leg, 2+ radial  pulses Neuro:alert and oriented  X 3, MAE, follows commands, + facial symmetry    EKG:  EKG is NOT ordered today.   Recent Labs: 06/06/2017: TSH 1.362 03/08/2018: B Natriuretic Peptide 100.9; Magnesium 1.6 04/25/2018: ALT 16; BUN 13; Creatinine, Ser 1.19; Hemoglobin 12.6; Platelets 257; Potassium 3.4; Sodium 138    Lipid Panel    Component Value Date/Time   CHOL 183 03/07/2018 0953   TRIG 87 03/07/2018 0953   HDL 38 (L) 03/07/2018 0953   CHOLHDL 4.8 03/07/2018 0953   CHOLHDL 5 07/24/2017 1145   VLDL 31.4 07/24/2017 1145   LDLCALC 128 (H) 03/07/2018 0953   LDLDIRECT 144.0 07/08/2015 1344       Other studies Reviewed: Additional studies/ records that were reviewed today include: . Cardiac Catheterization:03/09/2018  Difficult procedure from left radial approach due to difficulty with torque control. Would advise femoral approach in the future.  Eccentric 60% proximal LAD. Widely patent mid LAD stent. Antegrade flow to the apex.  Widely patent left main.  Totally occluded ostial circumflex to proximal circumflex.  Proximal mid and distal RCA moderate disease with tandem 65 to 75% % distal segmental stenoses.  Patent saphenous vein graft to the second obtuse marginal which supplies the entire circumflex territory.  Widely patent LIMA to the distal segment of the LAD. Competitive flow is noted.  Low normal LV systolic function with EF 50 to 55%. Mildly elevated end-diastolic pressure.  Continuous sharp left chest pain not felt to be ischemia related.  RECOMMENDATIONS:   Consider musculoskeletal or neuropathic pain as a source of the patient's presentation.  Continue aggressive risk factor modification for secondary prevention of ischemic events.  If No Local complications, could be discharged later today.   Echo 05/2017 Study Conclusions  - Left ventricle: The cavity size was normal. There was severe   concentric hypertrophy. Systolic function was  normal. The   estimated ejection fraction was in the range of 60% to 65%. Wall   motion was normal; there were no regional wall motion   abnormalities. Doppler parameters are consistent with abnormal   left ventricular relaxation (grade 1 diastolic dysfunction).   There was no evidence of elevated ventricular filling pressure by   Doppler parameters. - Aortic valve: Trileaflet; normal thickness leaflets. There was no   regurgitation. - Aortic root: The aortic root was normal in size. - Mitral valve: There was no regurgitation. - Left atrium: The atrium was mildly dilated. - Right ventricle: Systolic function was normal. - Tricuspid valve: There was mild regurgitation. - Pulmonary arteries: Systolic pressure was within the normal   range. - Inferior vena cava: The vessel was not visualized. - Pericardium, extracardiac: There was no pericardial effusion.   ASSESSMENT AND PLAN:  1.  CAD with no further angina, hx of CABG with patent grafts on recent cath in 02/2018. Keep follow up appt with Truitt Merle in  Oct.   2.  Chronic diastolic HF and chronic lower ext edema.  Stable is on lasix 60 mg daily.  Continue.   3.  CKD -3 will check BMP today  4.  HLD on crestor but only twice weekly due to mood swings.  To check hepatic and lipids today.    5.  Hypokalemia on last labs recheck today.    6.  Leg wound follow up with podiatry.   Current medicines are reviewed with the patient today.  The patient Has no concerns regarding medicines.  The following changes have been made:  See above Labs/ tests ordered today include:see  above  Disposition:   FU:  see above  Signed, Cecilie Kicks, NP  05/10/2018 5:42 PM    [facility] [place], [address] [place], [place] Phone: [phone]; Fax: [phone]

## 2018-05-10 ENCOUNTER — Ambulatory Visit (INDEPENDENT_AMBULATORY_CARE_PROVIDER_SITE_OTHER): Payer: Medicare Other | Admitting: Cardiology

## 2018-05-10 ENCOUNTER — Encounter: Payer: Self-pay | Admitting: Cardiology

## 2018-05-10 ENCOUNTER — Other Ambulatory Visit: Payer: Medicare Other

## 2018-05-10 VITALS — BP 124/62 | HR 77 | Ht 68.0 in | Wt 251.0 lb

## 2018-05-10 DIAGNOSIS — I5030 Unspecified diastolic (congestive) heart failure: Secondary | ICD-10-CM | POA: Diagnosis not present

## 2018-05-10 DIAGNOSIS — I2510 Atherosclerotic heart disease of native coronary artery without angina pectoris: Secondary | ICD-10-CM

## 2018-05-10 DIAGNOSIS — I5033 Acute on chronic diastolic (congestive) heart failure: Secondary | ICD-10-CM | POA: Diagnosis not present

## 2018-05-10 DIAGNOSIS — E785 Hyperlipidemia, unspecified: Secondary | ICD-10-CM

## 2018-05-10 DIAGNOSIS — I10 Essential (primary) hypertension: Secondary | ICD-10-CM

## 2018-05-10 DIAGNOSIS — I25709 Atherosclerosis of coronary artery bypass graft(s), unspecified, with unspecified angina pectoris: Secondary | ICD-10-CM

## 2018-05-10 DIAGNOSIS — I25810 Atherosclerosis of coronary artery bypass graft(s) without angina pectoris: Secondary | ICD-10-CM

## 2018-05-10 DIAGNOSIS — Z951 Presence of aortocoronary bypass graft: Secondary | ICD-10-CM

## 2018-05-10 NOTE — Patient Instructions (Signed)
Your physician recommends that you continue on your current medications as directed. Please refer to the Current Medication list given to you today.   Your physician recommends that you return for lab work in: [place] recommends that you schedule a follow-up appointment in: [place]

## 2018-05-11 LAB — BASIC METABOLIC PANEL
BUN/Creatinine Ratio: 10 (ref 10–24)
BUN: 11 mg/dL (ref 8–27)
CO2: 26 mmol/L (ref 20–29)
CREATININE: 1.07 mg/dL (ref 0.76–1.27)
Calcium: 9.1 mg/dL (ref 8.6–10.2)
Chloride: 102 mmol/L (ref 96–106)
GFR calc Af Amer: 78 mL/min/{1.73_m2} (ref >59)
GFR calc non Af Amer: 68 mL/min/{1.73_m2} (ref >59)
GLUCOSE: 174 mg/dL — AB (ref 65–99)
Potassium: 4.5 mmol/L (ref 3.5–5.2)
Sodium: 142 mmol/L (ref 134–144)

## 2018-05-11 LAB — LIPID PANEL
CHOLESTEROL TOTAL: 149 mg/dL (ref 100–199)
Chol/HDL Ratio: 4 ratio (ref 0.0–5.0)
HDL: 37 mg/dL — ABNORMAL LOW (ref >39)
LDL Calculated: 92 mg/dL (ref 0–99)
Triglycerides: 101 mg/dL (ref 0–149)
VLDL Cholesterol Cal: 20 mg/dL (ref 5–40)

## 2018-05-11 LAB — HEPATIC FUNCTION PANEL
ALBUMIN: 3.7 g/dL (ref 3.5–4.8)
ALT: 17 IU/L (ref 0–44)
AST: 18 IU/L (ref 0–40)
Alkaline Phosphatase: 98 IU/L (ref 39–117)
BILIRUBIN TOTAL: 0.4 mg/dL (ref 0.0–1.2)
BILIRUBIN, DIRECT: 0.14 mg/dL (ref 0.00–0.40)
TOTAL PROTEIN: 6.2 g/dL (ref 6.0–8.5)

## 2018-05-16 DIAGNOSIS — M79672 Pain in left foot: Secondary | ICD-10-CM | POA: Diagnosis not present

## 2018-05-16 DIAGNOSIS — E1151 Type 2 diabetes mellitus with diabetic peripheral angiopathy without gangrene: Secondary | ICD-10-CM | POA: Diagnosis not present

## 2018-05-16 DIAGNOSIS — I83222 Varicose veins of left lower extremity with both ulcer of calf and inflammation: Secondary | ICD-10-CM | POA: Diagnosis not present

## 2018-05-16 DIAGNOSIS — L97221 Non-pressure chronic ulcer of left calf limited to breakdown of skin: Secondary | ICD-10-CM | POA: Diagnosis not present

## 2018-05-30 DIAGNOSIS — I83222 Varicose veins of left lower extremity with both ulcer of calf and inflammation: Secondary | ICD-10-CM | POA: Diagnosis not present

## 2018-05-30 DIAGNOSIS — M79672 Pain in left foot: Secondary | ICD-10-CM | POA: Diagnosis not present

## 2018-05-30 DIAGNOSIS — L97221 Non-pressure chronic ulcer of left calf limited to breakdown of skin: Secondary | ICD-10-CM | POA: Diagnosis not present

## 2018-05-30 DIAGNOSIS — E1151 Type 2 diabetes mellitus with diabetic peripheral angiopathy without gangrene: Secondary | ICD-10-CM | POA: Diagnosis not present

## 2018-06-05 ENCOUNTER — Encounter (INDEPENDENT_AMBULATORY_CARE_PROVIDER_SITE_OTHER): Payer: Medicare Other | Admitting: Ophthalmology

## 2018-06-11 ENCOUNTER — Telehealth: Payer: Self-pay | Admitting: Emergency Medicine

## 2018-06-11 NOTE — Telephone Encounter (Signed)
Please ask pt to call PCP

## 2018-06-11 NOTE — Telephone Encounter (Signed)
Please advise on below  Dr Gherghe pt 

## 2018-06-11 NOTE — Telephone Encounter (Signed)
Pt called and stated he fell and has a wound on his leg he thinks is getting infected. He also said he is having some problems with his blood sugars. He is wondering if he can be squeezed in somewhere soon for a follow up. Please advise and give him a call back thanks.

## 2018-06-12 ENCOUNTER — Telehealth: Payer: Self-pay | Admitting: Internal Medicine

## 2018-06-12 ENCOUNTER — Encounter (INDEPENDENT_AMBULATORY_CARE_PROVIDER_SITE_OTHER): Payer: Medicare Other | Admitting: Ophthalmology

## 2018-06-12 ENCOUNTER — Ambulatory Visit: Payer: Medicare Other | Admitting: Nurse Practitioner

## 2018-06-12 DIAGNOSIS — E113591 Type 2 diabetes mellitus with proliferative diabetic retinopathy without macular edema, right eye: Secondary | ICD-10-CM | POA: Diagnosis not present

## 2018-06-12 DIAGNOSIS — E11319 Type 2 diabetes mellitus with unspecified diabetic retinopathy without macular edema: Secondary | ICD-10-CM

## 2018-06-12 DIAGNOSIS — H35033 Hypertensive retinopathy, bilateral: Secondary | ICD-10-CM

## 2018-06-12 DIAGNOSIS — I10 Essential (primary) hypertension: Secondary | ICD-10-CM

## 2018-06-12 DIAGNOSIS — E113392 Type 2 diabetes mellitus with moderate nonproliferative diabetic retinopathy without macular edema, left eye: Secondary | ICD-10-CM

## 2018-06-12 DIAGNOSIS — H43813 Vitreous degeneration, bilateral: Secondary | ICD-10-CM | POA: Diagnosis not present

## 2018-06-12 NOTE — Telephone Encounter (Signed)
Pt called back. Please give him a call back when you get a chance. He will be available. Thanks.

## 2018-06-12 NOTE — Telephone Encounter (Signed)
See telephone call dated 06/11/18

## 2018-06-12 NOTE — Telephone Encounter (Signed)
Pt is aware, states his PCP is communicating with his podiatrist. Wants a appointment with Dr Cruzita Lederer as soon as possible, advised pt I would have to ask when she returns for an appointment he wants her to see his foot and discuss his medications because he states his sugars have been very high and he can not afford his medications.

## 2018-06-12 NOTE — Telephone Encounter (Signed)
Patient returned your call. Please call patient at ph# [phone]

## 2018-06-12 NOTE — Telephone Encounter (Signed)
LMTCB

## 2018-06-12 NOTE — Telephone Encounter (Signed)
FYI pt has been buying 70/30 and taking that even though that was discontinued. Advised pt of this because that was the medication he stated was to expensive to continue buying.

## 2018-06-13 NOTE — Telephone Encounter (Signed)
Pt was taking the N, R, and 70/30 but was confused about the fact 70/30 was discontinued.

## 2018-06-13 NOTE — Telephone Encounter (Signed)
We can prescribe for you the 70/30 twice a day, but why not take the N and R as rx'ed by Dr Cruzita Lederer?

## 2018-06-18 ENCOUNTER — Encounter

## 2018-06-18 ENCOUNTER — Encounter: Payer: Self-pay | Admitting: Neurology

## 2018-06-18 ENCOUNTER — Ambulatory Visit (INDEPENDENT_AMBULATORY_CARE_PROVIDER_SITE_OTHER): Payer: Medicare Other | Admitting: Neurology

## 2018-06-18 VITALS — BP 120/70 | HR 76 | Ht 68.0 in | Wt 277.4 lb

## 2018-06-18 DIAGNOSIS — G250 Essential tremor: Secondary | ICD-10-CM | POA: Diagnosis not present

## 2018-06-18 DIAGNOSIS — E0842 Diabetes mellitus due to underlying condition with diabetic polyneuropathy: Secondary | ICD-10-CM | POA: Diagnosis not present

## 2018-06-18 DIAGNOSIS — G3184 Mild cognitive impairment, so stated: Secondary | ICD-10-CM | POA: Diagnosis not present

## 2018-06-18 DIAGNOSIS — G44219 Episodic tension-type headache, not intractable: Secondary | ICD-10-CM

## 2018-06-18 DIAGNOSIS — I25810 Atherosclerosis of coronary artery bypass graft(s) without angina pectoris: Secondary | ICD-10-CM | POA: Diagnosis not present

## 2018-06-18 NOTE — Telephone Encounter (Signed)
Okay, will keep him on my list for cancellations.

## 2018-06-18 NOTE — Telephone Encounter (Signed)
Pt is aware.  

## 2018-06-18 NOTE — Progress Notes (Signed)
Follow-up Visit   Date: 06/18/18    Edward [name] MRN: [id number] DOB: [birth year]   Interim History: Edward [name] is a 77 y.o. right-handed African American male with coronary artery disease, TIA (2002, manifested as right sided numbness), insulin dependent diabetes mellitus type 2, hypertension, hyperlipidemia, GERD and BPH returning to the clinic for follow-up of essential tremor.  The patient was accompanied to the clinic by self..  History of present illness: He moved from [place] in February 2015 to be closer to his brother. He has been diabetic since 1985 and in late 1990s, he developed numbness > tingling of the feet which seems to have been stable since onset. He has paresthesias below the level of the ankles, worse on the right side, as well as interemittent numbness/tingling of the right hand. He ambulates independently. He was started neurontin 100mg  twice daily which has been slowly titrated.  He was seeing Dr. Gery Pray in [place] for memory loss and headaches in 2014. Headaches improved significantly after he retired. He has short-term memory problems, which does not interefere with daily life. Mostly forgetting little details or tasks. He is driving and has not been involving in any accidents. He is able to do all his IADLs and ADLs.   Starting in 2015, he began having tremors of the hands and voice.  Family history was also notable for tremor in sister, mother, and two brothers have tremors.  He was started on propranolol 40mg  TID which helped some. Over the years, his tremors have gradually worsened. He was started on gabapentin 300mg  BID and noticed that clonazepman which he was taking for anxiety, helped.  This was discontinued in February 2019 which made hand tremors worse, therefore primidone was started.   UPDATE 04/23/2018:  He scheduled a sooner visit because of worsening headaches, which involve the temporal and bifrontal region, and is requesting CT  head. Headaches are worse in the morning.  He also complains of tremor, which is affecting his fine motor movements and unable to tasks such as cooking, eating, and hand writing.  Today, he also complains of memory changes and is worried that he has dementia.  He is misplacing things, he is able to keep up with appointments, finances, and medications. He has self-restricted driving.  No changes in behavior.  He suffered a fall and injured his left shin which has a wound that is slow to heel.   UPDATE 06/18/2018:  He is here for follow-up visit.  He is pleased with effectiveness of primidone 50mg  twice daily, which controls tremors much better than previously.  His headaches have resolved and memory is doing better.  His mood is doing much better because his family was visiting from [place].  He is also working on a new Clinical research associate book titled "At Home in [place]".    Medications:  Current Outpatient Medications on File Prior to Visit  Medication Sig Dispense Refill  . acetaminophen (TYLENOL) 500 MG tablet Take 2 tablets (1,000 mg total) by mouth every 6 (six) hours as needed. 30 tablet 0  . albuterol (VENTOLIN HFA) 108 (90 Base) MCG/ACT inhaler inhale 2 puffs by mouth every 6 hours if needed for wheezing or shortness of breath 18 g 1  . amLODipine (NORVASC) 10 MG tablet Take 1 tablet (10 mg total) by mouth daily. 90 tablet 1  . aspirin EC 81 MG tablet Take 81 mg by mouth daily.    Marland Kitchen atorvastatin (LIPITOR) 20 MG tablet atorvastatin 20 mg tablet    .  atorvastatin (LIPITOR) 80 MG tablet atorvastatin 80 mg tablet    . bismuth subsalicylate (PEPTO BISMOL) 262 MG/15ML suspension Take 30 mLs by mouth as needed for indigestion.    . Ciclopirox 1 % shampoo Apply 1 application topically See admin instructions. Massage in scalp three times weekly and leave on for 10 min. Then rinse.    . clonazePAM (KLONOPIN) 1 MG tablet Take 1 tablet (1 mg total) by mouth at bedtime. 30 tablet 1  . clopidogrel (PLAVIX) 75 MG tablet  Take 1 tablet (75 mg total) by mouth daily. 90 tablet 3  . cyclobenzaprine (FLEXERIL) 5 MG tablet Take 1 tablet (5 mg total) by mouth at bedtime as needed for muscle spasms (headache). 30 tablet 1  . diclofenac sodium (VOLTAREN) 1 % GEL Apply 2 g topically 4 (four) times daily as needed (for pain).     Marland Kitchen dicyclomine (BENTYL) 10 MG capsule dicyclomine 10 mg capsule    . erythromycin ophthalmic ointment erythromycin 5 mg/gram (0.5 %) eye ointment    . esomeprazole (NEXIUM) 40 MG capsule Take by mouth.    . fenofibrate 160 MG tablet Take by mouth.    . furosemide (LASIX) 40 MG tablet Take 1.5 tablets (60 mg total) by mouth daily. 135 tablet 3  . gabapentin (NEURONTIN) 300 MG capsule take 1 capsule by mouth at bedtime for 1 week then INCREASE TO 1 CAP TWICE DAILY 60 capsule 11  . hydrALAZINE (APRESOLINE) 25 MG tablet Take 1 tablet (25 mg total) by mouth 3 (three) times daily. 270 tablet 3  . HYDROcodone-acetaminophen (NORCO/VICODIN) 5-325 MG tablet hydrocodone 5 mg-acetaminophen 325 mg tablet    . insulin aspart (NOVOLOG) 100 UNIT/ML injection Novolog U-100 Insulin aspart 100 unit/mL subcutaneous solution    . insulin detemir (LEVEMIR) 100 UNIT/ML injection Levemir U-100 Insulin 100 unit/mL subcutaneous solution    . insulin NPH Human (HUMULIN N) 100 UNIT/ML injection Inject under skin 30 units 2x a day - ReliOn (Patient taking differently: Inject 40 Units into the skin 2 (two) times daily before a meal. Inject under skin 30 units 2x a day - ReliOn) 20 mL 5  . insulin NPH-regular Human (NOVOLIN 70/30) (70-30) 100 UNIT/ML injection Inject under skin 40 units in the morning and 60 units before dinner    . insulin regular (NOVOLIN R) 100 units/mL injection Inject 0.2 mLs (20 Units total) into the skin 3 (three) times daily before meals. 30 mL 1  . Insulin Syringe-Needle U-100 (BD INSULIN SYRINGE U/F) 31G X 5/16" 1 ML MISC Use 4 times per day to inject insulin. 300 each 5  . Insulin Syringe/Needle U-500 (BD  INSULIN SYRINGE U-500) 31G X 6MM 0.5 ML MISC     . isosorbide mononitrate (IMDUR) 60 MG 24 hr tablet Take by mouth.    Marland Kitchen lisinopril (PRINIVIL,ZESTRIL) 20 MG tablet Take 20 mg by mouth daily.    Marland Kitchen losartan (COZAAR) 25 MG tablet losartan 25 mg tablet    . meclizine (ANTIVERT) 25 MG tablet Take 1 tablet (25 mg total) by mouth 2 (two) times daily as needed for dizziness. 30 tablet 1  . nitroGLYCERIN (NITROSTAT) 0.4 MG SL tablet Place 1 tablet (0.4 mg total) under the tongue every 5 (five) minutes as needed for chest pain. Max: 3 doses per episode. 25 tablet 3  . pantoprazole (PROTONIX) 40 MG tablet Take 1 tablet (40 mg total) by mouth daily. 90 tablet 3  . Polyvinyl Alcohol-Povidone (REFRESH OP) Place 1 drop into both eyes 3 (three)  times daily as needed (for dry eyes). Reported on 12/25/2015    . potassium chloride SA (K-DUR,KLOR-CON) 20 MEQ tablet TAKE 1 & 1/2 (ONE & ONE-HALF) TABLETS BY MOUTH ONCE DAILY 135 tablet 3  . primidone (MYSOLINE) 50 MG tablet Take 1 tablet (50 mg total) by mouth 2 (two) times daily. 60 tablet 11  . propranolol (INDERAL) 40 MG tablet Take 1 tablet (40 mg total) by mouth 3 (three) times daily. 270 tablet 3  . psyllium (METAMUCIL) 58.6 % packet Take 1 packet by mouth daily as needed (constipation).    . rosuvastatin (CRESTOR) 20 MG tablet Take 1 tablet (20 mg total) by mouth daily. (Patient taking differently: Take 20 mg by mouth 2 (two) times a week. ) 90 tablet 3   No current facility-administered medications on file prior to visit.     Allergies:  Allergies  Allergen Reactions  . Pneumococcal Vaccines Anaphylaxis    Review of Systems:  CONSTITUTIONAL: No fevers, chills, night sweats, or weight loss.  EYES: No visual changes or eye pain ENT: No hearing changes.  No history of nose bleeds.   RESPIRATORY: No cough, wheezing and shortness of breath.   CARDIOVASCULAR: Negative for chest pain, and palpitations.   GI: Negative for abdominal discomfort, blood in stools  or black stools.  No recent change in bowel habits.   GU:  No history of incontinence.   MUSCLOSKELETAL: +history of joint pain swelling.  No myalgias.   SKIN: No for lesions, rash, and itching.   ENDOCRINE: Negative for cold or heat intolerance, polydipsia or goiter.   PSYCH:  no depression or anxiety symptoms.   NEURO: As Above.   Vital Signs:  BP 120/70   Pulse 76   Ht 5\' 8"  (1.727 m)   Wt 277 lb 6 oz (125.8 kg)   SpO2 98%   BMI 42.17 kg/m   General Medical Exam:   General:  Well appearing, comfortable   Neurological Exam: MENTAL STATUS including orientation to time, place, person, recent and remote memory, attention span and concentration, language, and fund of knowledge is fairly intact.  Speech is not dysarthric, tremulous pattern.  CRANIAL NERVES:  Pupils are round and reactive. Extraocular muscle are intact.  Mild bilateral ptosis (old) Face is symmetric.   MOTOR:  Motor strength is 5/5 in all extremities.  There is a mild hand tremor which is worse with finger to nose testing.  No pronator drift.  Tone is normal.    COORDINATION/GAIT:  Normal finger-to- nose-finger.  Gait is wide-based, slow.   Data: EMG 12/01/2014: 1. 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 Overall, these findings are moderately severe in degree electrically.  2. 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 There is no evidence of a cervical/lumbosacral radiculopathy affecting the right side  Lab Results  Component Value Date   HGBA1C 10.2 (H) 03/13/2018   Lab Results  Component Value Date   TSH 1.362 06/06/2017   Labs 04/08/2014:  Copper 64*, ceruloplasmin 18, vitamin B12 290, SPEP/UPEP with IFE no M protein  CT head 10/21/2014 and 01/15/2015:  Negative  CT head wo contrast 04/21/2016:  Negative  IMPRESSION/PLAN: 1.   Essential tremor involving the hands and voice, improved on primodone.    - Continue primidone to 50mg  twice daily  - Continue propranolol 40mg  TID.   - Continue gabapentin 300mg  twice daily  2.  Mild cognitive impairment - improved and most likely due to mood-related,   - Cancel neurocognitive testing  3.  Chronic daily headache - resolved.  - Continue flexeril 5mg  at bedtime as needed  4.  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 Cruzita Lederer.  EMG with moderately severe neuropathy affecting the right side.  Gait appears somewhat unsteady, instructed to use a cane as needed.  Return to clinic in 4 months  Greater than 50% of this 20 minute visit was spent in counseling, explanation of diagnosis, planning of further management, and coordination of care.   Thank you for allowing me to participate in patient's care.  If I can answer any additional questions, I would be pleased to do so.    Sincerely,    Roshini Fulwider K. Posey Pronto, DO

## 2018-06-18 NOTE — Telephone Encounter (Signed)
LMTCB

## 2018-06-18 NOTE — Patient Instructions (Signed)
You look great, keep it up!  Cancel neurocognitive testing  Continue your medications as you are taking them  Return to clinic in 4 months

## 2018-06-18 NOTE — Telephone Encounter (Signed)
Pt goes tomorrow for his foot wound. He said he is watching his diet more and that he has gotten on the right regimen. He said this morning it was 182. He has been having his sugars go up and down he says. He would like to come in as soon as he can for all of this, he has been on the correct regimen for about a week.

## 2018-06-18 NOTE — Telephone Encounter (Signed)
Pt called back, please give him a call. He will stay by the phone

## 2018-06-18 NOTE — Telephone Encounter (Signed)
Yes, please advise him to take the insulin as prescribed, only N and R.  This is the cheapest insulin available, if it comes from [facility].  After he is on this regimen for at least 2 weeks, we can squeeze him in.  However, in the meantime, he may need to see his PCP or podiatrist for the wound on his foot.

## 2018-06-19 ENCOUNTER — Encounter: Payer: Self-pay | Admitting: Family Medicine

## 2018-06-19 ENCOUNTER — Ambulatory Visit (INDEPENDENT_AMBULATORY_CARE_PROVIDER_SITE_OTHER): Payer: Medicare Other | Admitting: Family Medicine

## 2018-06-19 VITALS — BP 132/60 | HR 79 | Temp 98.6°F | Resp 16 | Ht 68.0 in | Wt 280.0 lb

## 2018-06-19 DIAGNOSIS — I5033 Acute on chronic diastolic (congestive) heart failure: Secondary | ICD-10-CM | POA: Diagnosis not present

## 2018-06-19 DIAGNOSIS — G250 Essential tremor: Secondary | ICD-10-CM | POA: Diagnosis not present

## 2018-06-19 DIAGNOSIS — E1165 Type 2 diabetes mellitus with hyperglycemia: Secondary | ICD-10-CM | POA: Diagnosis not present

## 2018-06-19 DIAGNOSIS — N183 Chronic kidney disease, stage 3 unspecified: Secondary | ICD-10-CM

## 2018-06-19 DIAGNOSIS — L03116 Cellulitis of left lower limb: Secondary | ICD-10-CM | POA: Insufficient documentation

## 2018-06-19 DIAGNOSIS — E1151 Type 2 diabetes mellitus with diabetic peripheral angiopathy without gangrene: Secondary | ICD-10-CM

## 2018-06-19 DIAGNOSIS — R200 Anesthesia of skin: Secondary | ICD-10-CM

## 2018-06-19 DIAGNOSIS — I25709 Atherosclerosis of coronary artery bypass graft(s), unspecified, with unspecified angina pectoris: Secondary | ICD-10-CM

## 2018-06-19 DIAGNOSIS — E785 Hyperlipidemia, unspecified: Secondary | ICD-10-CM

## 2018-06-19 DIAGNOSIS — I10 Essential (primary) hypertension: Secondary | ICD-10-CM | POA: Diagnosis not present

## 2018-06-19 DIAGNOSIS — R202 Paresthesia of skin: Secondary | ICD-10-CM

## 2018-06-19 DIAGNOSIS — I5032 Chronic diastolic (congestive) heart failure: Secondary | ICD-10-CM | POA: Diagnosis not present

## 2018-06-19 DIAGNOSIS — IMO0002 Reserved for concepts with insufficient information to code with codable children: Secondary | ICD-10-CM

## 2018-06-19 DIAGNOSIS — E1169 Type 2 diabetes mellitus with other specified complication: Secondary | ICD-10-CM | POA: Diagnosis not present

## 2018-06-19 DIAGNOSIS — I25810 Atherosclerosis of coronary artery bypass graft(s) without angina pectoris: Secondary | ICD-10-CM

## 2018-06-19 LAB — COMPREHENSIVE METABOLIC PANEL
ALBUMIN: 3.6 g/dL (ref 3.5–5.2)
ALK PHOS: 96 U/L (ref 39–117)
ALT: 13 U/L (ref 0–53)
AST: 12 U/L (ref 0–37)
BUN: 17 mg/dL (ref 6–23)
CALCIUM: 9.4 mg/dL (ref 8.4–10.5)
CO2: 32 mEq/L (ref 19–32)
Chloride: 103 mEq/L (ref 96–112)
Creatinine, Ser: 1.17 mg/dL (ref 0.40–1.50)
GFR: 77.93 mL/min (ref >60.00)
Glucose, Bld: 237 mg/dL — ABNORMAL HIGH (ref 70–99)
POTASSIUM: 4.3 meq/L (ref 3.5–5.1)
Sodium: 141 mEq/L (ref 135–145)
TOTAL PROTEIN: 6.3 g/dL (ref 6.0–8.3)
Total Bilirubin: 0.4 mg/dL (ref 0.2–1.2)

## 2018-06-19 LAB — LIPID PANEL
Cholesterol: 120 mg/dL (ref 0–200)
HDL: 37.1 mg/dL — AB (ref >39.00)
LDL Cholesterol: 52 mg/dL (ref 0–99)
NonHDL: 82.43
TRIGLYCERIDES: 153 mg/dL — AB (ref 0.0–149.0)
Total CHOL/HDL Ratio: 3
VLDL: 30.6 mg/dL (ref 0.0–40.0)

## 2018-06-19 LAB — HEMOGLOBIN A1C: Hgb A1c MFr Bld: 9.5 % — ABNORMAL HIGH (ref 4.6–6.5)

## 2018-06-19 NOTE — Assessment & Plan Note (Deleted)
Per cardiology 

## 2018-06-19 NOTE — Assessment & Plan Note (Signed)
Resolved  rto prn 

## 2018-06-19 NOTE — Assessment & Plan Note (Signed)
Check labs today.

## 2018-06-19 NOTE — Assessment & Plan Note (Signed)
Tolerating statin, encouraged heart healthy diet, avoid trans fats, minimize simple carbs and saturated fats. Increase exercise as tolerated 

## 2018-06-19 NOTE — Assessment & Plan Note (Signed)
Check labs con't meds 

## 2018-06-19 NOTE — Assessment & Plan Note (Signed)
Well controlled, no changes to meds. Encouraged heart healthy diet such as the DASH diet and exercise as tolerated.  °

## 2018-06-19 NOTE — Assessment & Plan Note (Signed)
Splint given to pt  ? CTS---if splints do not work-- refer to Copy

## 2018-06-19 NOTE — Assessment & Plan Note (Signed)
Per neuro 

## 2018-06-19 NOTE — Progress Notes (Signed)
Patient ID: Edward [name], male    DOB: 09/02/1941  Age: 77 y.o. MRN: [id number]    Subjective:  Subjective  HPI JUMAANE WEATHERFORD presents for f/u dm, cholesterol and htn.  Pt recently had a cellulitis of L leg.  [facility] was [facility] and it has healed.   He is c/o numbness in 1-4 fingers on both hands.   HYPERTENSION   Blood pressure range-not checking at home  Chest pain- no      Dyspnea- no Lightheadedness- no   Edema- no  Other side effects - no   Medication compliance: good Low salt diet- yes    DIABETES    Blood Sugar ranges-up and down,,  Per endo  Polyuria- no New Visual problems- no  Hypoglycemic symptoms- no  Other side effects-no Medication compliance - good Last eye exam- last week Foot exam- today   HYPERLIPIDEMIA  Medication compliance- good RUQ pain- no  Muscle aches- no Other side effects-no       Review of Systems  Constitutional: Negative for chills and fever.  HENT: Negative for congestion and hearing loss.   Eyes: Negative for discharge.  Respiratory: Negative for cough and shortness of breath.   Cardiovascular: Negative for chest pain, palpitations and leg swelling.  Gastrointestinal: Negative for abdominal pain, blood in stool, constipation, diarrhea, nausea and vomiting.  Genitourinary: Negative for dysuria, frequency, hematuria and urgency.  Musculoskeletal: Negative for back pain and myalgias.  Skin: Negative for rash.  Allergic/Immunologic: Negative for environmental allergies.  Neurological: Negative for dizziness, weakness and headaches.  Hematological: Does not bruise/bleed easily.  Psychiatric/Behavioral: Negative for suicidal ideas. The patient is not nervous/anxious.     History Past Medical History:  Diagnosis Date  . Adenomatous colon polyp 04/2011  . CAD (coronary artery disease)    a. 01/2015 DES to LAD  b. 12/2015: [place] 85% oLCx lesion--> rx therapy.  . Chronic diastolic CHF (congestive heart failure) ([place])    a. 05/2017  Echo: EF 60-65%, Gr1 DD, no rwma, mildly dil LA, nl RV fxn, mild TR.  . CKD (chronic kidney disease), stage II    a. probable CKD II-III with baseline CR 1.1-1.3.  . DM type 2 (diabetes mellitus, type 2), insulin dependent 01/29/2014   fasting cbg 50-120 with new regimen  . Dyslipidemia, goal LDL below 70 01/29/2014  . Enlarged prostate   . Essential tremor    a. on proprnolol  . GERD (gastroesophageal reflux disease)   . Hypertension   . Internal hemorrhoid   . Sleep apnea    does not use cpap (06/04/2015)  . TIA (transient ischemic attack) 2002    He has a past surgical history that includes Total knee arthroplasty (Bilateral); Lumbar disc surgery; Cataract extraction (Bilateral); Joint replacement; Back surgery; Cataract extraction, bilateral (Bilateral); Shoulder arthroscopy (Right, 07/30/2014); left heart catheterization with coronary angiogram (N/A, 01/28/2014); left heart catheterization with coronary angiogram (N/A, 01/30/2015); Cardiac catheterization (01/28/14); Coronary angioplasty with stent (01/30/2015); Cardiac catheterization (N/A, 12/21/2015); Cardiac catheterization (N/A, 11/02/2016); Coronary artery bypass graft (N/A, 11/09/2016); TEE without cardioversion (N/A, 11/09/2016); Endoharvest vein of greater saphenous vein (Left, 11/09/2016); and LEFT HEART CATH AND CORS/GRAFTS ANGIOGRAPHY (N/A, 03/09/2018).   His family history includes Alzheimer's disease in his sister; Asthma in his brother; CAD in his brother and sister; Diabetes in his other; Prostate cancer in his brother.He reports that he has never smoked. He has never used smokeless tobacco. He reports that he does not drink alcohol or use drugs.  Current  Outpatient Medications on File Prior to Visit  Medication Sig Dispense Refill  . acetaminophen (TYLENOL) 500 MG tablet Take 2 tablets (1,000 mg total) by mouth every 6 (six) hours as needed. 30 tablet 0  . albuterol (VENTOLIN HFA) 108 (90 Base) MCG/ACT inhaler inhale 2 puffs by mouth  every 6 hours if needed for wheezing or shortness of breath 18 g 1  . amLODipine (NORVASC) 10 MG tablet Take 1 tablet (10 mg total) by mouth daily. 90 tablet 1  . aspirin EC 81 MG tablet Take 81 mg by mouth daily.    Marland Kitchen bismuth subsalicylate (PEPTO BISMOL) 262 MG/15ML suspension Take 30 mLs by mouth as needed for indigestion.    . Ciclopirox 1 % shampoo Apply 1 application topically See admin instructions. Massage in scalp three times weekly and leave on for 10 min. Then rinse.    . clonazePAM (KLONOPIN) 1 MG tablet Take 1 tablet (1 mg total) by mouth at bedtime. 30 tablet 1  . clopidogrel (PLAVIX) 75 MG tablet Take 1 tablet (75 mg total) by mouth daily. 90 tablet 3  . cyclobenzaprine (FLEXERIL) 5 MG tablet Take 1 tablet (5 mg total) by mouth at bedtime as needed for muscle spasms (headache). 30 tablet 1  . diclofenac sodium (VOLTAREN) 1 % GEL Apply 2 g topically 4 (four) times daily as needed (for pain).     Marland Kitchen dicyclomine (BENTYL) 10 MG capsule dicyclomine 10 mg capsule    . erythromycin ophthalmic ointment erythromycin 5 mg/gram (0.5 %) eye ointment    . esomeprazole (NEXIUM) 40 MG capsule Take by mouth.    . fenofibrate 160 MG tablet Take by mouth.    . furosemide (LASIX) 40 MG tablet Take 1.5 tablets (60 mg total) by mouth daily. 135 tablet 3  . gabapentin (NEURONTIN) 300 MG capsule take 1 capsule by mouth at bedtime for 1 week then INCREASE TO 1 CAP TWICE DAILY 60 capsule 11  . hydrALAZINE (APRESOLINE) 25 MG tablet Take 1 tablet (25 mg total) by mouth 3 (three) times daily. 270 tablet 3  . HYDROcodone-acetaminophen (NORCO/VICODIN) 5-325 MG tablet hydrocodone 5 mg-acetaminophen 325 mg tablet    . insulin aspart (NOVOLOG) 100 UNIT/ML injection Novolog U-100 Insulin aspart 100 unit/mL subcutaneous solution    . insulin detemir (LEVEMIR) 100 UNIT/ML injection Levemir U-100 Insulin 100 unit/mL subcutaneous solution    . insulin NPH Human (HUMULIN N) 100 UNIT/ML injection Inject under skin 30 units  2x a day - ReliOn (Patient taking differently: Inject 40 Units into the skin 2 (two) times daily before a meal. Inject under skin 30 units 2x a day - ReliOn) 20 mL 5  . insulin NPH-regular Human (NOVOLIN 70/30) (70-30) 100 UNIT/ML injection Inject under skin 40 units in the morning and 60 units before dinner    . insulin regular (NOVOLIN R) 100 units/mL injection Inject 0.2 mLs (20 Units total) into the skin 3 (three) times daily before meals. 30 mL 1  . Insulin Syringe-Needle U-100 (BD INSULIN SYRINGE U/F) 31G X 5/16" 1 ML MISC Use 4 times per day to inject insulin. 300 each 5  . Insulin Syringe/Needle U-500 (BD INSULIN SYRINGE U-500) 31G X 6MM 0.5 ML MISC     . isosorbide mononitrate (IMDUR) 60 MG 24 hr tablet Take by mouth.    Marland Kitchen lisinopril (PRINIVIL,ZESTRIL) 20 MG tablet Take 20 mg by mouth daily.    Marland Kitchen losartan (COZAAR) 25 MG tablet losartan 25 mg tablet    . meclizine (ANTIVERT) 25  MG tablet Take 1 tablet (25 mg total) by mouth 2 (two) times daily as needed for dizziness. 30 tablet 1  . nitroGLYCERIN (NITROSTAT) 0.4 MG SL tablet Place 1 tablet (0.4 mg total) under the tongue every 5 (five) minutes as needed for chest pain. Max: 3 doses per episode. 25 tablet 3  . pantoprazole (PROTONIX) 40 MG tablet Take 1 tablet (40 mg total) by mouth daily. 90 tablet 3  . Polyvinyl Alcohol-Povidone (REFRESH OP) Place 1 drop into both eyes 3 (three) times daily as needed (for dry eyes). Reported on 12/25/2015    . potassium chloride SA (K-DUR,KLOR-CON) 20 MEQ tablet TAKE 1 & 1/2 (ONE & ONE-HALF) TABLETS BY MOUTH ONCE DAILY 135 tablet 3  . primidone (MYSOLINE) 50 MG tablet Take 1 tablet (50 mg total) by mouth 2 (two) times daily. 60 tablet 11  . propranolol (INDERAL) 40 MG tablet Take 1 tablet (40 mg total) by mouth 3 (three) times daily. 270 tablet 3  . psyllium (METAMUCIL) 58.6 % packet Take 1 packet by mouth daily as needed (constipation).    . rosuvastatin (CRESTOR) 20 MG tablet Take 1 tablet (20 mg total) by  mouth daily. (Patient taking differently: Take 20 mg by mouth 2 (two) times a week. ) 90 tablet 3   No current facility-administered medications on file prior to visit.      Objective:  Objective  Physical Exam  Constitutional: He is oriented to person, place, and time. Vital signs are normal. He appears well-developed and well-nourished. He is sleeping.  HENT:  Head: Normocephalic and atraumatic.  Mouth/Throat: Oropharynx is clear and moist.  Eyes: Pupils are equal, round, and reactive to light. EOM are normal.  Neck: Normal range of motion. Neck supple. No thyromegaly present.  Cardiovascular: Normal rate and regular rhythm.  No murmur heard. Pulmonary/Chest: Effort normal and breath sounds normal. No respiratory distress. He has no wheezes. He has no rales. He exhibits no tenderness.  Musculoskeletal: He exhibits no edema or tenderness.  Neurological: He is alert and oriented to person, place, and time.  Skin: Skin is warm and dry.  Psychiatric: He has a normal mood and affect. His behavior is normal. Judgment and thought content normal.  Nursing note and vitals reviewed.  Diabetic Foot Exam - Simple   Simple Foot Form Diabetic Foot exam was performed with the following findings:  Yes 06/19/2018 10:31 AM  Visual Inspection No deformities, no ulcerations, no other skin breakdown bilaterally:  Yes Sensation Testing Intact to touch and monofilament testing bilaterally:  Yes Pulse Check Posterior Tibialis and Dorsalis pulse intact bilaterally:  Yes Comments     BP 132/60   Pulse 79   Temp 98.6 F (37 C) (Oral)   Resp 16   Ht 5\' 8"  (1.727 m)   Wt 280 lb (127 kg)   SpO2 97%   BMI 42.57 kg/m  Wt Readings from Last 3 Encounters:  06/19/18 280 lb (127 kg)  06/18/18 277 lb 6 oz (125.8 kg)  05/10/18 251 lb (113.9 kg)     Lab Results  Component Value Date   WBC 6.1 04/25/2018   HGB 12.6 (L) 04/25/2018   HCT 39.9 04/25/2018   PLT 257 04/25/2018   GLUCOSE 237 (H)  06/19/2018   CHOL 120 06/19/2018   TRIG 153.0 (H) 06/19/2018   HDL 37.10 (L) 06/19/2018   LDLDIRECT 144.0 07/08/2015   LDLCALC 52 06/19/2018   ALT 13 06/19/2018   AST 12 06/19/2018   NA 141 06/19/2018  K 4.3 06/19/2018   CL 103 06/19/2018   CREATININE 1.17 06/19/2018   BUN 17 06/19/2018   CO2 32 06/19/2018   TSH 1.362 06/06/2017   PSA 0.74 02/11/2014   INR 1.10 03/08/2018   HGBA1C 9.5 (H) 06/19/2018   MICROALBUR 62.1 (H) 01/02/2017    Dg Tibia/fibula Left  Result Date: 04/25/2018 CLINICAL DATA:  Open wound of left lower leg. EXAM: LEFT TIBIA AND FIBULA - 2 VIEW COMPARISON:  None. FINDINGS: There is no evidence of fracture or other focal bone lesions. Soft tissues are unremarkable. IMPRESSION: Normal left tibia and fibula. Electronically Signed   By: Marijo Conception, M.D.   On: 04/25/2018 11:42     Assessment & Plan:  Plan  I have discontinued Rhoderick L. Theard's atorvastatin and atorvastatin. I am also having him maintain his aspirin EC, Polyvinyl Alcohol-Povidone (REFRESH OP), diclofenac sodium, acetaminophen, psyllium, meclizine, albuterol, insulin NPH Human, nitroGLYCERIN, Insulin Syringe-Needle U-100, Ciclopirox, lisinopril, bismuth subsalicylate, pantoprazole, propranolol, clopidogrel, gabapentin, amLODipine, clonazePAM, rosuvastatin, insulin regular, hydrALAZINE, furosemide, primidone, cyclobenzaprine, potassium chloride SA, dicyclomine, erythromycin, esomeprazole, fenofibrate, HYDROcodone-acetaminophen, insulin aspart, insulin detemir, insulin NPH-regular Human, Insulin Syringe/Needle U-500, isosorbide mononitrate, and losartan.  No orders of the defined types were placed in this encounter.   Problem List Items Addressed This Visit      Unprioritized   Acute on chronic diastolic (congestive) heart failure (HCC)   Cellulitis of left anterior lower leg    Resolved  rto prn      Chronic diastolic heart failure ([place])    Per cardiology      Chronic kidney disease, stage  III (moderate) ([place])    Check labs today      Coronary artery disease involving coronary bypass graft of native heart with angina pectoris ([place]) (Chronic)    Check labs  con't meds      Essential hypertension (Chronic)    Well controlled, no changes to meds. Encouraged heart healthy diet such as the DASH diet and exercise as tolerated.       Relevant Orders   Comprehensive metabolic panel (Completed)   Essential tremor    Per neuro      Hyperlipidemia LDL goal <70 (Chronic)    Tolerating statin, encouraged heart healthy diet, avoid trans fats, minimize simple carbs and saturated fats. Increase exercise as tolerated      Numbness and tingling in both hands    Splint given to pt  ? CTS---if splints do not work-- refer to Copy       Other Visit Diagnoses    DM (diabetes mellitus) type II uncontrolled, periph vascular disorder ([place])    -  Primary   Relevant Orders   Hemoglobin A1c (Completed)   Comprehensive metabolic panel (Completed)   Hyperlipidemia associated with type 2 diabetes mellitus ([place])       Relevant Orders   Comprehensive metabolic panel (Completed)   Lipid panel (Completed)      Follow-up: Return in about 6 months (around 12/20/2018) for hypertension, hyperlipidemia, diabetes II.  Ann Held, DO

## 2018-06-19 NOTE — Patient Instructions (Signed)
Carpal Tunnel Syndrome Carpal tunnel syndrome is a condition that causes pain in your hand and arm. The carpal tunnel is a narrow area located on the palm side of your wrist. Repeated wrist motion or certain diseases may cause swelling within the tunnel. This swelling pinches the main nerve in the wrist (median nerve). What are the causes? This condition may be caused by:  Repeated wrist motions.  Wrist injuries.  Arthritis.  A cyst or tumor in the carpal tunnel.  Fluid buildup during pregnancy.  Sometimes the cause of this condition is not known. What increases the risk? This condition is more likely to develop in:  People who have jobs that cause them to repeatedly move their wrists in the same motion, such as butchers and cashiers.  Women.  People with certain conditions, such as: ? Diabetes. ? Obesity. ? An underactive thyroid (hypothyroidism). ? Kidney failure.  What are the signs or symptoms? Symptoms of this condition include:  A tingling feeling in your fingers, especially in your thumb, index, and middle fingers.  Tingling or numbness in your hand.  An aching feeling in your entire arm, especially when your wrist and elbow are bent for long periods of time.  Wrist pain that goes up your arm to your shoulder.  Pain that goes down into your palm or fingers.  A weak feeling in your hands. You may have trouble grabbing and holding items.  Your symptoms may feel worse during the night. How is this diagnosed? This condition is diagnosed with a medical history and physical exam. You may also have tests, including:  An electromyogram (EMG). This test measures electrical signals sent by your nerves into the muscles.  X-rays.  How is this treated? Treatment for this condition includes:  Lifestyle changes. It is important to stop doing or modify the activity that caused your condition.  Physical or occupational therapy.  Medicines for pain and inflammation.  This may include medicine that is injected into your wrist.  A wrist splint.  Surgery.  Follow these instructions at home: If you have a splint:  Wear it as told by your health care provider. Remove it only as told by your health care provider.  Loosen the splint if your fingers become numb and tingle, or if they turn cold and blue.  Keep the splint clean and dry. General instructions  Take over-the-counter and prescription medicines only as told by your health care provider.  Rest your wrist from any activity that may be causing your pain. If your condition is work related, talk to your employer about changes that can be made, such as getting a wrist pad to use while typing.  If directed, apply ice to the painful area: ? Put ice in a plastic bag. ? Place a towel between your skin and the bag. ? Leave the ice on for 20 minutes, 2-3 times per day.  Keep all follow-up visits as told by your health care provider. This is important.  Do any exercises as told by your health care provider, physical therapist, or occupational therapist. Contact a health care provider if:  You have new symptoms.  Your pain is not controlled with medicines.  Your symptoms get worse. This information is not intended to replace advice given to you by your health care provider. Make sure you discuss any questions you have with your health care provider. Document Released: 10/14/2000 Document Revised: 02/25/2016 Document Reviewed: 03/04/2015 Elsevier Interactive Patient Education  2018 [employer]  

## 2018-06-19 NOTE — Assessment & Plan Note (Signed)
Per cardiology 

## 2018-06-20 ENCOUNTER — Telehealth: Payer: Self-pay | Admitting: Family Medicine

## 2018-06-20 NOTE — Telephone Encounter (Signed)
See result note.  

## 2018-06-20 NOTE — Telephone Encounter (Signed)
Patient returning call for results. Ok'd for [facility] nurse to give results in result notes. Thank you    Copied from [place] [phone]. Topic: Quick Communication - Lab Results >> Jun 20, 2018  4:37 PM Jiles Prows, [place] wrote: Called patient to inform them of 06-19-18 lab results,  release glucose/ hgba1c as elevated. Advised patient this has been release to Dr. Philemon Kingdom for management. Please advise patient to schedule follow up with Dr. Etter Sjogren in 6 months.Marland Kitchen

## 2018-06-21 ENCOUNTER — Encounter: Payer: Self-pay | Admitting: Internal Medicine

## 2018-06-21 ENCOUNTER — Ambulatory Visit (INDEPENDENT_AMBULATORY_CARE_PROVIDER_SITE_OTHER): Payer: Medicare Other | Admitting: Internal Medicine

## 2018-06-21 VITALS — BP 152/74 | HR 86 | Ht 68.0 in | Wt 282.6 lb

## 2018-06-21 DIAGNOSIS — Z794 Long term (current) use of insulin: Secondary | ICD-10-CM

## 2018-06-21 DIAGNOSIS — E785 Hyperlipidemia, unspecified: Secondary | ICD-10-CM | POA: Diagnosis not present

## 2018-06-21 DIAGNOSIS — E1159 Type 2 diabetes mellitus with other circulatory complications: Secondary | ICD-10-CM

## 2018-06-21 DIAGNOSIS — I25810 Atherosclerosis of coronary artery bypass graft(s) without angina pectoris: Secondary | ICD-10-CM | POA: Diagnosis not present

## 2018-06-21 LAB — GLUCOSE, POCT (MANUAL RESULT ENTRY): POC GLUCOSE: 142 mg/dL — AB (ref 70–99)

## 2018-06-21 MED ORDER — INSULIN NPH (HUMAN) (ISOPHANE) 100 UNIT/ML ~~LOC~~ SUSP
SUBCUTANEOUS | 5 refills | Status: DC
Start: 2018-06-21 — End: 2018-07-03

## 2018-06-21 MED ORDER — INSULIN REGULAR HUMAN 100 UNIT/ML IJ SOLN: 30.0000 [IU] | Freq: Three times a day (TID) | INTRAMUSCULAR | 11 refills | Status: DC

## 2018-06-21 NOTE — Patient Instructions (Addendum)
Please change the insulin regimen (add NPH and decrease short acting insulin): Insulin Before breakfast Before lunch Before dinner  Regular/Humalog 30 30 30   NPH 30  30  Please inject the insulin 30 min before meals.  Please return in 2 months with your sugar log.

## 2018-06-21 NOTE — Progress Notes (Signed)
Patient ID: Edward [name], male   DOB: July 30, 1941, 77 y.o.   MRN: [id number]  HPI: Edward [name] is a 77 y.o.-year-old male, returning for f/u for DM2,  dx 1987, insulin-dependent since 1989, uncontrolled, with complications (CAD - s/p 2 AMIs, s/p CABG x2 10/2016, CHF, PN). Last visit 7.5 months ago.  He called [place] several days ago about uncontrolled blood sugars, however, at that time, he was not taking the recommended insulin regimen so I advised him to start the regimen suggested at last visit.  He is now only on Humalog (per samples by Dr. Etter Sjogren) and no NPH (ran out and could not afford to buy it. However, he tells me he now set money aside to buy his insulins. Also, [facility]  sometimes do not have N or R insulin in stock and he has been buying the 70/30 instead...  He had cellulitis in 03/2018.  Latest HbA1c levels: Lab Results  Component Value Date   HGBA1C 9.5 (H) 06/19/2018   HGBA1C 10.2 (H) 03/13/2018   HGBA1C 9.8 11/06/2017  09/13/2016: HbA1c calculated from fructosamine is 6.86%   He was on: - Levemir 70 units at night (He did not switch to 45 units twice a day as suggested at last visit) - Novolog 15 min before meals: - 30 units before a smaller meal - 40 units before a larger meal He was on Metformin >> N/V/D. U500 insulin was not covered for him despite multiple requests and PAs to the insurance  He was then on premixed insulin, 70/30.  Currently on:  Insulin Before breakfast Before lunch Before dinner  Regular (Novolin), Humalog now (samples) 40 40 40  NPH (but ran out) Please inject the insulin 30 min before meals.  Pt checks his sugars 1-2 times a day: - am:  280-400 (!) >> 130-150 >> 240-453 >> 81, 130-198 - 2h after b'fast: 150 x1 >> 334-518 >> n/c - before lunch: 180-210 >> 102 -162 >> 140-160 - 2h after lunch:  170-180s >> n/c >> 277 >> n/c - before dinner: 200-300 >> 120-130 >> n/c - 2h after dinner: n/c >> 280 >> n/c >> 160-165 - bedtime:140-165 >> 170-175  >> n/c - nighttime: occas.80s >> 115 >> n/c Lowest sugar was 78 >> 81; he has hypoglycemia awareness at 100. Highest sugar was 518 >> 200s.  Has a One Touch Ultra meter.   -+ CKD, last BUN/creatinine:  Lab Results  Component Value Date   BUN 17 06/19/2018   CREATININE 1.17 06/19/2018  Pt is seen by nephrology - proteinuria (555 mg/24h).  On Avapro- Dr. Hollie Salk. -+ HL. Last set of lipids: Lab Results  Component Value Date   CHOL 120 06/19/2018   HDL 37.10 (L) 06/19/2018   LDLCALC 52 06/19/2018   LDLDIRECT 144.0 07/08/2015   TRIG 153.0 (H) 06/19/2018   CHOLHDL 3 06/19/2018  On pravastatin.- - last eye exam was in 2019: + DR (Dr. Zigmund Daniel).  He has a history of cataract surgery and also eyelid surgery. - + numbness and tingling in his feet. Sees podiatry.  He had a foot exam 06/19/2018 by PCP. On ASA 81.  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 He was then in cardiac rehabilitation >> then admitted again 05/2017 for CHF exacerbation.  ROS: Constitutional: no weight gain/no weight loss, no fatigue, no subjective hyperthermia, no subjective hypothermia Eyes: no blurry vision, no xerophthalmia ENT: no sore throat, no nodules palpated in throat, no dysphagia, no odynophagia, no hoarseness Cardiovascular: no CP/no SOB/no  palpitations/no leg swelling Respiratory: no cough/no SOB/no wheezing Gastrointestinal: no N/no V/no D/no C/no acid reflux Musculoskeletal: no muscle aches/no joint aches Skin: no rashes, no hair loss Neurological: no tremors/+ numbness/+ tingling/no dizziness  I reviewed pt's medications, allergies, PMH, social hx, family hx, and changes were documented in the history of present illness. Otherwise, unchanged from my initial visit note.  Past Medical History:  Diagnosis Date  . Adenomatous colon polyp 04/2011  . CAD (coronary artery disease)    a. 01/2015 DES to LAD  b. 12/2015: [place] 85% oLCx lesion--> rx therapy.  . Chronic diastolic CHF (congestive heart failure) ([place])    a.  05/2017 Echo: EF 60-65%, Gr1 DD, no rwma, mildly dil LA, nl RV fxn, mild TR.  . CKD (chronic kidney disease), stage II    a. probable CKD II-III with baseline CR 1.1-1.3.  . DM type 2 (diabetes mellitus, type 2), insulin dependent 01/29/2014   fasting cbg 50-120 with new regimen  . Dyslipidemia, goal LDL below 70 01/29/2014  . Enlarged prostate   . Essential tremor    a. on proprnolol  . GERD (gastroesophageal reflux disease)   . Hypertension   . Internal hemorrhoid   . Sleep apnea    does not use cpap (06/04/2015)  . TIA (transient ischemic attack) 2002   Past Surgical History:  Procedure Laterality Date  . BACK SURGERY    . CARDIAC CATHETERIZATION  01/28/14   + CAD treat medically  . CARDIAC CATHETERIZATION N/A 12/21/2015   Procedure: Right/Left Heart Cath and Coronary Angiography;  Surgeon: Belva Crome, MD;  Location: [facility];  Service: Cardiovascular;  Laterality: N/A;  . CARDIAC CATHETERIZATION N/A 11/02/2016   Procedure: Left Heart Cath and Coronary Angiography;  Surgeon: Nelva Bush, MD;  Location: [facility];  Service: Cardiovascular;  Laterality: N/A;  . CATARACT EXTRACTION Bilateral   . CATARACT EXTRACTION, BILATERAL Bilateral   . CORONARY ANGIOPLASTY WITH STENT PLACEMENT  01/30/2015   DES [place]  [place] to LAD by Dr Tamala Julian  . CORONARY ARTERY BYPASS GRAFT N/A 11/09/2016   Procedure: CORONARY ARTERY BYPASS GRAFTING (CABG) x 2;  Surgeon: Melrose Nakayama, MD;  Location: [facility];  Service: Open Heart Surgery;  Laterality: N/A;  . ENDOVEIN HARVEST OF GREATER SAPHENOUS VEIN Left 11/09/2016   Procedure: ENDOVEIN HARVEST OF GREATER SAPHENOUS VEIN;  Surgeon: Melrose Nakayama, MD;  Location: [facility];  Service: Open Heart Surgery;  Laterality: Left;  . JOINT REPLACEMENT    . LEFT HEART CATH AND CORS/GRAFTS ANGIOGRAPHY N/A 03/09/2018   Procedure: LEFT HEART CATH AND CORS/GRAFTS ANGIOGRAPHY;  Surgeon: Belva Crome, MD;  Location: [facility];  Service:  Cardiovascular;  Laterality: N/A;  . LEFT HEART CATHETERIZATION WITH CORONARY ANGIOGRAM N/A 01/28/2014   Procedure: LEFT HEART CATHETERIZATION WITH CORONARY ANGIOGRAM;  Surgeon: Sinclair Grooms, MD;  Location: [facility];  Service: Cardiovascular;  Laterality: N/A;  . LEFT HEART CATHETERIZATION WITH CORONARY ANGIOGRAM N/A 01/30/2015   Procedure: LEFT HEART CATHETERIZATION WITH CORONARY ANGIOGRAM;  Surgeon: Belva Crome, MD;  Location: [facility] CATH LAB;  Service: Cardiovascular;  Laterality: N/A;  . LUMBAR [place]    . SHOULDER ARTHROSCOPY Right 07/30/2014   Procedure: Right Shoulder Arthroscopy, Debridement, Decompression, Manipulation Under Anesthesia;  Surgeon: Newt Minion, MD;  Location: [facility];  Service: Orthopedics;  Laterality: Right;  . TEE WITHOUT CARDIOVERSION N/A 11/09/2016   Procedure: TRANSESOPHAGEAL ECHOCARDIOGRAM (TEE);  Surgeon: Melrose Nakayama, MD;  Location: [facility];  Service: Open Heart Surgery;  Laterality: N/A;  . TOTAL KNEE ARTHROPLASTY Bilateral    Social History   Socioeconomic History  . Marital status: Married    Spouse name: Not on file  . Number of children: 0  . Years of education: Not on file  . Highest education level: Not on file  Occupational History  . Occupation: retired  Scientific laboratory technician  . Financial resource strain: Not on file  . Food insecurity:    Worry: Not on file    Inability: Not on file  . Transportation needs:    Medical: Not on file    Non-medical: Not on file  Tobacco Use  . Smoking status: Never Smoker  . Smokeless tobacco: Never Used  Substance and Sexual Activity  . Alcohol use: No    Alcohol/week: 0.0 standard drinks  . Drug use: No  . Sexual activity: Not Currently  Lifestyle  . Physical activity:    Days per week: Not on file    Minutes per session: Not on file  . Stress: Not on file  Relationships  . Social connections:    Talks on phone: Not on file    Gets together: Not on file    Attends religious service: Not on  file    Active member of club or organization: Not on file    Attends meetings of clubs or organizations: Not on file    Relationship status: Not on file  . Intimate partner violence:    Fear of current or ex partner: Not on file    Emotionally abused: Not on file    Physically abused: Not on file    Forced sexual activity: Not on file  Other Topics Concern  . Not on file  Social History Narrative   He is a Geophysicist/field seismologist by trade.   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   He lives alone.  His wife is living in [place] at this time.  They do not have children.   Highest level of education:  College graduate   Current Outpatient Medications on File Prior to Visit  Medication Sig Dispense Refill  . acetaminophen (TYLENOL) 500 MG tablet Take 2 tablets (1,000 mg total) by mouth every 6 (six) hours as needed. 30 tablet 0  . albuterol (VENTOLIN HFA) 108 (90 Base) MCG/ACT inhaler inhale 2 puffs by mouth every 6 hours if needed for wheezing or shortness of breath 18 g 1  . amLODipine (NORVASC) 10 MG tablet Take 1 tablet (10 mg total) by mouth daily. 90 tablet 1  . aspirin EC 81 MG tablet Take 81 mg by mouth daily.    Marland Kitchen bismuth subsalicylate (PEPTO BISMOL) 262 MG/15ML suspension Take 30 mLs by mouth as needed for indigestion.    . Ciclopirox 1 % shampoo Apply 1 application topically See admin instructions. Massage in scalp three times weekly and leave on for 10 min. Then rinse.    . clonazePAM (KLONOPIN) 1 MG tablet Take 1 tablet (1 mg total) by mouth at bedtime. 30 tablet 1  . clopidogrel (PLAVIX) 75 MG tablet Take 1 tablet (75 mg total) by mouth daily. 90 tablet 3  . cyclobenzaprine (FLEXERIL) 5 MG tablet Take 1 tablet (5 mg total) by mouth at bedtime as needed for muscle spasms (headache). 30 tablet 1  . diclofenac sodium (VOLTAREN) 1 % GEL Apply 2 g topically 4 (four) times daily as needed (for pain).     Marland Kitchen  dicyclomine (BENTYL) 10 MG capsule dicyclomine 10 mg capsule     . erythromycin ophthalmic ointment erythromycin 5 mg/gram (0.5 %) eye ointment    . esomeprazole (NEXIUM) 40 MG capsule Take by mouth.    . fenofibrate 160 MG tablet Take by mouth.    . furosemide (LASIX) 40 MG tablet Take 1.5 tablets (60 mg total) by mouth daily. 135 tablet 3  . gabapentin (NEURONTIN) 300 MG capsule take 1 capsule by mouth at bedtime for 1 week then INCREASE TO 1 CAP TWICE DAILY 60 capsule 11  . hydrALAZINE (APRESOLINE) 25 MG tablet Take 1 tablet (25 mg total) by mouth 3 (three) times daily. 270 tablet 3  . HYDROcodone-acetaminophen (NORCO/VICODIN) 5-325 MG tablet hydrocodone 5 mg-acetaminophen 325 mg tablet    . insulin aspart (NOVOLOG) 100 UNIT/ML injection Novolog U-100 Insulin aspart 100 unit/mL subcutaneous solution    . insulin detemir (LEVEMIR) 100 UNIT/ML injection Levemir U-100 Insulin 100 unit/mL subcutaneous solution    . insulin NPH Human (HUMULIN N) 100 UNIT/ML injection Inject under skin 30 units 2x a day - ReliOn (Patient taking differently: Inject 40 Units into the skin 2 (two) times daily before a meal. Inject under skin 30 units 2x a day - ReliOn) 20 mL 5  . insulin NPH-regular Human (NOVOLIN 70/30) (70-30) 100 UNIT/ML injection Inject under skin 40 units in the morning and 60 units before dinner    . insulin regular (NOVOLIN R) 100 units/mL injection Inject 0.2 mLs (20 Units total) into the skin 3 (three) times daily before meals. 30 mL 1  . Insulin Syringe-Needle U-100 (BD INSULIN SYRINGE U/F) 31G X 5/16" 1 ML MISC Use 4 times per day to inject insulin. 300 each 5  . Insulin Syringe/Needle U-500 (BD INSULIN SYRINGE U-500) 31G X 6MM 0.5 ML MISC     . isosorbide mononitrate (IMDUR) 60 MG 24 hr tablet Take by mouth.    Marland Kitchen lisinopril (PRINIVIL,ZESTRIL) 20 MG tablet Take 20 mg by mouth daily.    Marland Kitchen losartan (COZAAR) 25 MG tablet losartan 25 mg tablet    . meclizine (ANTIVERT) 25 MG tablet Take 1 tablet (25 mg total) by mouth 2 (two) times daily as needed for  dizziness. 30 tablet 1  . nitroGLYCERIN (NITROSTAT) 0.4 MG SL tablet Place 1 tablet (0.4 mg total) under the tongue every 5 (five) minutes as needed for chest pain. Max: 3 doses per episode. 25 tablet 3  . pantoprazole (PROTONIX) 40 MG tablet Take 1 tablet (40 mg total) by mouth daily. 90 tablet 3  . Polyvinyl Alcohol-Povidone (REFRESH OP) Place 1 drop into both eyes 3 (three) times daily as needed (for dry eyes). Reported on 12/25/2015    . potassium chloride SA (K-DUR,KLOR-CON) 20 MEQ tablet TAKE 1 & 1/2 (ONE & ONE-HALF) TABLETS BY MOUTH ONCE DAILY 135 tablet 3  . primidone (MYSOLINE) 50 MG tablet Take 1 tablet (50 mg total) by mouth 2 (two) times daily. 60 tablet 11  . propranolol (INDERAL) 40 MG tablet Take 1 tablet (40 mg total) by mouth 3 (three) times daily. 270 tablet 3  . psyllium (METAMUCIL) 58.6 % packet Take 1 packet by mouth daily as needed (constipation).    . rosuvastatin (CRESTOR) 20 MG tablet Take 1 tablet (20 mg total) by mouth daily. (Patient taking differently: Take 20 mg by mouth 2 (two) times a week. ) 90 tablet 3   No current facility-administered medications on file prior to visit.    Allergies  Allergen Reactions  .  Pneumococcal Vaccines Anaphylaxis   Family History  Problem Relation Age of Onset  . CAD Brother        2 brothers - CABG  . Alzheimer's disease Sister   . CAD Sister   . Prostate cancer Brother   . Asthma Brother        2 brothers   . Diabetes Other        entire family  . Colon cancer Neg Hx     PE: BP (!) 152/74 (BP Location: Right Arm, Patient Position: Sitting, Cuff Size: Normal)   Pulse 86   Ht 5\' 8"  (1.727 m)   Wt 282 lb 9.6 oz (128.2 kg)   SpO2 97%   BMI 42.97 kg/m  Body mass index is 42.97 kg/m. Wt Readings from Last 3 Encounters:  06/21/18 282 lb 9.6 oz (128.2 kg)  06/19/18 280 lb (127 kg)  06/18/18 277 lb 6 oz (125.8 kg)   Constitutional: overweight, in NAD Eyes: PERRLA, EOMI, no exophthalmos ENT: moist mucous membranes, no  thyromegaly, no cervical lymphadenopathy Cardiovascular: RRR, No MRG Respiratory: CTA B Gastrointestinal: abdomen soft, NT, ND, BS+ Musculoskeletal: no deformities, strength intact in all 4 Skin: moist, warm, no rashes Neurological: no tremor with outstretched hands, DTR normal in all 4  ASSESSMENT: 1. DM2, insulin-dependent, uncontrolled, with complications - CAD H/o steroid inj's  2. HL  Cardiologist: Dr Tamala Julian. Pulmonologist: Dr Lake Bells  PLAN:  1. Patient with type 2 diabetes, previously doing well on U500 insulin, but this became not affordable for him anymore, we then tried basal-bolus insulin regimen, but he was not successful and he opted to switch to premixed insulin.  However, his sugars were uncontrolled at last visit so I advised him to stop the premixed insulin and go back to the NPH and regular insulin regimen.  He is constantly changing the regimen and is confused about the instructions although I always explained them repeatedly to him and give them to him in writing.  For example he called [place] several days ago that his sugars are abnormal and he was taking premixed insulin again. I advised him to switch to NPH and regular insulin but he could not afford NPH yet. He will pick this up today. He is on Humalog 40 units 3x a day -  Samples from PCP. We discussed that the NPH and regular insulin formulations that he gets from [facility] at the cheapest available, but he needs to get them outside his insurance. He now set money aside for his insulins. - Reviewed his latest HbA1c, which was slightly better at 9.5%, decreased from 9.8% - I suggested to:  Patient Instructions   Please change the insulin regimen (add NPH and decrease short acting insulin): Insulin Before breakfast Before lunch Before dinner  Regular/Humalog 30 30 30   NPH 30  30  Please inject the insulin 30 min before meals.  Please return in 2 months with your sugar log.  - continue checking sugars at different times  of the day - check 3x a day, rotating checks - advised for yearly eye exams >> he is UTD - Return to clinic in 2 mo with sugar log    2. HL - Reviewed latest lipid panel from 2 days ago: LDL much improved, at goal now, with slightly high triglycerides and slightly low HDL Lab Results  Component Value Date   CHOL 120 06/19/2018   HDL 37.10 (L) 06/19/2018   LDLCALC 52 06/19/2018   LDLDIRECT 144.0 07/08/2015   TRIG  153.0 (H) 06/19/2018   CHOLHDL 3 06/19/2018  - Continues the statin without side effects.  - time spent with the patient: 30 min, of which >50% was spent in counseling him about his medication treatment, options for insulin treatment regimens and discussing how best to obtain the insulin. We also adjusted his dose as he feels he is dropping his sugars too low if he takes the entire 40 units of rapid acting insulin before a meal; he had a number of questions which I addressed.  Edward Kingdom, MD PhD [facility]

## 2018-06-25 ENCOUNTER — Telehealth: Payer: Self-pay | Admitting: Internal Medicine

## 2018-06-25 MED ORDER — INSULIN ASPART 100 UNIT/ML ~~LOC~~ SOLN: 40.0000 [IU] | Freq: Three times a day (TID) | SUBCUTANEOUS | 1 refills | Status: DC

## 2018-06-25 MED ORDER — INSULIN ASPART 100 UNIT/ML ~~LOC~~ SOLN: 30.0000 [IU] | Freq: Three times a day (TID) | SUBCUTANEOUS | 1 refills | Status: DC

## 2018-06-25 NOTE — Addendum Note (Signed)
Addended by: Drucilla Schmidt on: 06/25/2018 01:07 PM   Modules accepted: Orders

## 2018-06-25 NOTE — Telephone Encounter (Signed)
Please advise on doses pt is not on Novolog currently

## 2018-06-25 NOTE — Telephone Encounter (Signed)
Pt stated that it is cheaper and sent to [facility]

## 2018-06-25 NOTE — Telephone Encounter (Signed)
He can definitely use NovoLog instead of regular insulin if he can afford it.

## 2018-06-25 NOTE — Telephone Encounter (Signed)
Patient called ph# [phone]. Please call patient at the ph# listed herein re: patient spoke with insurance co. And they said patient can get 10 vials Novalog for $ 80.00. Insurance co fax# [phone]. Please send RX for the requested medication x 10 vials to [facility]. Patient needs medication immediately. Almost out of insulin. Patient requests to have [place] call her.

## 2018-07-03 ENCOUNTER — Other Ambulatory Visit: Payer: Self-pay

## 2018-07-03 ENCOUNTER — Telehealth: Payer: Self-pay | Admitting: Emergency Medicine

## 2018-07-03 MED ORDER — INSULIN NPH (HUMAN) (ISOPHANE) 100 UNIT/ML ~~LOC~~ SUSP: SUBCUTANEOUS | 3 refills | Status: DC

## 2018-07-03 NOTE — Telephone Encounter (Signed)
Pt is using Novolog and states he needs Novolin N refilled not Novolin R. Is that ok to send in?

## 2018-07-03 NOTE — Telephone Encounter (Signed)
Pt called and requested a 60 day supply of his insulin regular (NOVOLIN R) 100 units/mL injection. [facility] [address] [facility] [address].

## 2018-07-03 NOTE — Telephone Encounter (Signed)
Please clarify if patient is still on this medication I was under the impression he switched?

## 2018-07-03 NOTE — Telephone Encounter (Signed)
Sent Humulin N because that was what we sent in 06/21/18 for pt.

## 2018-07-03 NOTE — Telephone Encounter (Signed)
He is on this and NPH. This is w/o Rx at [facility].Marland KitchenMarland KitchenMarland Kitchen

## 2018-07-05 ENCOUNTER — Ambulatory Visit (INDEPENDENT_AMBULATORY_CARE_PROVIDER_SITE_OTHER): Payer: Medicare Other | Admitting: Family Medicine

## 2018-07-05 ENCOUNTER — Encounter: Payer: Self-pay | Admitting: Family Medicine

## 2018-07-05 ENCOUNTER — Telehealth: Payer: Self-pay

## 2018-07-05 ENCOUNTER — Encounter: Payer: Self-pay | Admitting: Neurology

## 2018-07-05 VITALS — BP 148/70 | HR 81 | Temp 98.4°F | Ht 68.0 in | Wt 281.2 lb

## 2018-07-05 DIAGNOSIS — I25810 Atherosclerosis of coronary artery bypass graft(s) without angina pectoris: Secondary | ICD-10-CM | POA: Diagnosis not present

## 2018-07-05 DIAGNOSIS — H6121 Impacted cerumen, right ear: Secondary | ICD-10-CM

## 2018-07-05 NOTE — Telephone Encounter (Signed)
Patient called today stating the pharmacy has not received the Novolin N he was supposed to get- please order and patient would like a call back when this has been done

## 2018-07-05 NOTE — Assessment & Plan Note (Signed)
Irrigated successfully [place] slightly irritated but pt wants to hold off on gtts for now rec debrox for future wax problems  F/u prn

## 2018-07-05 NOTE — Progress Notes (Signed)
Pre visit review using our clinic review tool, if applicable. No additional management support is needed unless otherwise documented below in the visit note. 

## 2018-07-05 NOTE — Progress Notes (Signed)
Patient ID: Edward [name], male    DOB: 07/31/1941  Age: 77 y.o. MRN: [id number]    Subjective:  Subjective  HPI Edward [name] presents for hearing loss R ear.  No other complaints  Review of Systems  Constitutional: Negative for appetite change, diaphoresis, fatigue and unexpected weight change.  HENT: Positive for hearing loss. Negative for ear discharge and ear pain.   Eyes: Negative for pain, redness and visual disturbance.  Respiratory: Negative for cough, chest tightness, shortness of breath and wheezing.   Cardiovascular: Negative for chest pain, palpitations and leg swelling.  Endocrine: Negative for cold intolerance, heat intolerance, polydipsia, polyphagia and polyuria.  Genitourinary: Negative for difficulty urinating, dysuria and frequency.  Neurological: Negative for dizziness, light-headedness, numbness and headaches.    History Past Medical History:  Diagnosis Date  . Adenomatous colon polyp 04/2011  . CAD (coronary artery disease)    a. 01/2015 DES to LAD  b. 12/2015: [place] 85% oLCx lesion--> rx therapy.  . Chronic diastolic CHF (congestive heart failure) ([place])    a. 05/2017 Echo: EF 60-65%, Gr1 DD, no rwma, mildly dil LA, nl RV fxn, mild TR.  . CKD (chronic kidney disease), stage II    a. probable CKD II-III with baseline CR 1.1-1.3.  . DM type 2 (diabetes mellitus, type 2), insulin dependent 01/29/2014   fasting cbg 50-120 with new regimen  . Dyslipidemia, goal LDL below 70 01/29/2014  . Enlarged prostate   . Essential tremor    a. on proprnolol  . GERD (gastroesophageal reflux disease)   . Hypertension   . Internal hemorrhoid   . Sleep apnea    does not use cpap (06/04/2015)  . TIA (transient ischemic attack) 2002    He has a past surgical history that includes Total knee arthroplasty (Bilateral); Lumbar disc surgery; Cataract extraction (Bilateral); Joint replacement; Back surgery; Cataract extraction, bilateral (Bilateral); Shoulder arthroscopy (Right, 07/30/2014);  left heart catheterization with coronary angiogram (N/A, 01/28/2014); left heart catheterization with coronary angiogram (N/A, 01/30/2015); Cardiac catheterization (01/28/14); Coronary angioplasty with stent (01/30/2015); Cardiac catheterization (N/A, 12/21/2015); Cardiac catheterization (N/A, 11/02/2016); Coronary artery bypass graft (N/A, 11/09/2016); TEE without cardioversion (N/A, 11/09/2016); Endoharvest vein of greater saphenous vein (Left, 11/09/2016); and LEFT HEART CATH AND CORS/GRAFTS ANGIOGRAPHY (N/A, 03/09/2018).   His family history includes Alzheimer's disease in his sister; Asthma in his brother; CAD in his brother and sister; Diabetes in his other; Prostate cancer in his brother.He reports that he has never smoked. He has never used smokeless tobacco. He reports that he does not drink alcohol or use drugs.  Current Outpatient Medications on File Prior to Visit  Medication Sig Dispense Refill  . acetaminophen (TYLENOL) 500 MG tablet Take 2 tablets (1,000 mg total) by mouth every 6 (six) hours as needed. 30 tablet 0  . albuterol (VENTOLIN HFA) 108 (90 Base) MCG/ACT inhaler inhale 2 puffs by mouth every 6 hours if needed for wheezing or shortness of breath 18 g 1  . amLODipine (NORVASC) 10 MG tablet Take 1 tablet (10 mg total) by mouth daily. 90 tablet 1  . aspirin EC 81 MG tablet Take 81 mg by mouth daily.    Marland Kitchen bismuth subsalicylate (PEPTO BISMOL) 262 MG/15ML suspension Take 30 mLs by mouth as needed for indigestion.    . Ciclopirox 1 % shampoo Apply 1 application topically See admin instructions. Massage in scalp three times weekly and leave on for 10 min. Then rinse.    . clonazePAM (KLONOPIN) 1 MG tablet Take  1 tablet (1 mg total) by mouth at bedtime. 30 tablet 1  . clopidogrel (PLAVIX) 75 MG tablet Take 1 tablet (75 mg total) by mouth daily. 90 tablet 3  . cyclobenzaprine (FLEXERIL) 5 MG tablet Take 1 tablet (5 mg total) by mouth at bedtime as needed for muscle spasms (headache). 30 tablet 1  .  diclofenac sodium (VOLTAREN) 1 % GEL Apply 2 g topically 4 (four) times daily as needed (for pain).     Marland Kitchen dicyclomine (BENTYL) 10 MG capsule dicyclomine 10 mg capsule    . erythromycin ophthalmic ointment erythromycin 5 mg/gram (0.5 %) eye ointment    . esomeprazole (NEXIUM) 40 MG capsule Take by mouth.    . fenofibrate 160 MG tablet Take by mouth.    . furosemide (LASIX) 40 MG tablet Take 1.5 tablets (60 mg total) by mouth daily. 135 tablet 3  . gabapentin (NEURONTIN) 300 MG capsule take 1 capsule by mouth at bedtime for 1 week then INCREASE TO 1 CAP TWICE DAILY 60 capsule 11  . hydrALAZINE (APRESOLINE) 25 MG tablet Take 1 tablet (25 mg total) by mouth 3 (three) times daily. 270 tablet 3  . HYDROcodone-acetaminophen (NORCO/VICODIN) 5-325 MG tablet hydrocodone 5 mg-acetaminophen 325 mg tablet    . insulin aspart (NOVOLOG) 100 UNIT/ML injection Inject 40 Units into the skin 3 (three) times daily before meals. 100 mL 1  . insulin NPH Human (HUMULIN N) 100 UNIT/ML injection Inject under skin 30 units 2x a day - ReliOn 40 mL 3  . Insulin Syringe-Needle U-100 (BD INSULIN SYRINGE U/F) 31G X 5/16" 1 ML MISC Use 4 times per day to inject insulin. 300 each 5  . isosorbide mononitrate (IMDUR) 60 MG 24 hr tablet Take by mouth.    Marland Kitchen lisinopril (PRINIVIL,ZESTRIL) 20 MG tablet Take 20 mg by mouth daily.    Marland Kitchen losartan (COZAAR) 25 MG tablet losartan 25 mg tablet    . meclizine (ANTIVERT) 25 MG tablet Take 1 tablet (25 mg total) by mouth 2 (two) times daily as needed for dizziness. 30 tablet 1  . nitroGLYCERIN (NITROSTAT) 0.4 MG SL tablet Place 1 tablet (0.4 mg total) under the tongue every 5 (five) minutes as needed for chest pain. Max: 3 doses per episode. 25 tablet 3  . pantoprazole (PROTONIX) 40 MG tablet Take 1 tablet (40 mg total) by mouth daily. 90 tablet 3  . Polyvinyl Alcohol-Povidone (REFRESH OP) Place 1 drop into both eyes 3 (three) times daily as needed (for dry eyes). Reported on 12/25/2015    .  potassium chloride SA (K-DUR,KLOR-CON) 20 MEQ tablet TAKE 1 & 1/2 (ONE & ONE-HALF) TABLETS BY MOUTH ONCE DAILY 135 tablet 3  . primidone (MYSOLINE) 50 MG tablet Take 1 tablet (50 mg total) by mouth 2 (two) times daily. 60 tablet 11  . propranolol (INDERAL) 40 MG tablet Take 1 tablet (40 mg total) by mouth 3 (three) times daily. 270 tablet 3  . psyllium (METAMUCIL) 58.6 % packet Take 1 packet by mouth daily as needed (constipation).    . rosuvastatin (CRESTOR) 20 MG tablet Take 1 tablet (20 mg total) by mouth daily. (Patient taking differently: Take 20 mg by mouth 2 (two) times a week. ) 90 tablet 3   No current facility-administered medications on file prior to visit.      Objective:  Objective  Physical Exam  Constitutional: He is oriented to person, place, and time. Vital signs are normal. He appears well-developed and well-nourished. He is sleeping.  HENT:  Head: Normocephalic and atraumatic.  Left Ear: Hearing, tympanic membrane, external ear and ear canal normal.  Ears:  Mouth/Throat: Oropharynx is clear and moist.  Eyes: Pupils are equal, round, and reactive to light. EOM are normal.  Neck: Normal range of motion. Neck supple. No thyromegaly present.  Cardiovascular: Normal rate and regular rhythm.  No murmur heard. Pulmonary/Chest: Effort normal and breath sounds normal. No respiratory distress. He has no wheezes. He has no rales. He exhibits no tenderness.  Musculoskeletal: He exhibits no edema or tenderness.  Neurological: He is alert and oriented to person, place, and time.  Skin: Skin is warm and dry.  Psychiatric: He has a normal mood and affect. His behavior is normal. Judgment and thought content normal.  Nursing note and vitals reviewed.  BP (!) 148/70 (BP Location: Left Arm, Patient Position: Sitting, Cuff Size: Large)   Pulse 81   Temp 98.4 F (36.9 C) (Oral)   Ht 5\' 8"  (1.727 m)   Wt 281 lb 4 oz (127.6 kg)   SpO2 95%   BMI 42.76 kg/m  Wt Readings from Last 3  Encounters:  07/05/18 281 lb 4 oz (127.6 kg)  06/21/18 282 lb 9.6 oz (128.2 kg)  06/19/18 280 lb (127 kg)     Lab Results  Component Value Date   WBC 6.1 04/25/2018   HGB 12.6 (L) 04/25/2018   HCT 39.9 04/25/2018   PLT 257 04/25/2018   GLUCOSE 237 (H) 06/19/2018   CHOL 120 06/19/2018   TRIG 153.0 (H) 06/19/2018   HDL 37.10 (L) 06/19/2018   LDLDIRECT 144.0 07/08/2015   LDLCALC 52 06/19/2018   ALT 13 06/19/2018   AST 12 06/19/2018   NA 141 06/19/2018   K 4.3 06/19/2018   CL 103 06/19/2018   CREATININE 1.17 06/19/2018   BUN 17 06/19/2018   CO2 32 06/19/2018   TSH 1.362 06/06/2017   PSA 0.74 02/11/2014   INR 1.10 03/08/2018   HGBA1C 9.5 (H) 06/19/2018   MICROALBUR 62.1 (H) 01/02/2017    Dg Tibia/fibula Left  Result Date: 04/25/2018 CLINICAL DATA:  Open wound of left lower leg. EXAM: LEFT TIBIA AND FIBULA - 2 VIEW COMPARISON:  None. FINDINGS: There is no evidence of fracture or other focal bone lesions. Soft tissues are unremarkable. IMPRESSION: Normal left tibia and fibula. Electronically Signed   By: Marijo Conception, M.D.   On: 04/25/2018 11:42     Assessment & Plan:  Plan  I am having Jazmine L. Tilly maintain his aspirin EC, Polyvinyl Alcohol-Povidone (REFRESH OP), diclofenac sodium, acetaminophen, psyllium, meclizine, albuterol, nitroGLYCERIN, Insulin Syringe-Needle U-100, Ciclopirox, lisinopril, bismuth subsalicylate, pantoprazole, propranolol, clopidogrel, gabapentin, amLODipine, clonazePAM, rosuvastatin, hydrALAZINE, furosemide, primidone, cyclobenzaprine, potassium chloride SA, dicyclomine, erythromycin, esomeprazole, fenofibrate, HYDROcodone-acetaminophen, isosorbide mononitrate, losartan, insulin aspart, and insulin NPH Human.  No orders of the defined types were placed in this encounter.   Problem List Items Addressed This Visit      Unprioritized   Impacted cerumen of right ear - Primary    Irrigated successfully Canal slightly irritated but pt wants to hold  off on gtts for now rec debrox for future wax problems  F/u prn         Follow-up: Return if symptoms worsen or fail to improve.  Ann Held, DO

## 2018-07-05 NOTE — Patient Instructions (Addendum)
Earwax Buildup, Adult The ears produce a substance called earwax that helps keep bacteria out of the ear and protects the skin in the ear canal. Occasionally, earwax can build up in the ear and cause discomfort or hearing loss. What increases the risk? This condition is more likely to develop in people who:  Are male.  Are elderly.  Naturally produce more earwax.  Clean their ears often with cotton swabs.  Use earplugs often.  Use in-ear headphones often.  Wear hearing aids.  Have narrow ear canals.  Have earwax that is overly thick or sticky.  Have eczema.  Are dehydrated.  Have excess hair in the ear canal.  What are the signs or symptoms? Symptoms of this condition include:  Reduced or muffled hearing.  A feeling of fullness in the ear or feeling that the ear is plugged.  Fluid coming from the ear.  Ear pain.  Ear itch.  Ringing in the ear.  Coughing.  An obvious piece of earwax that can be seen inside the ear canal.  How is this diagnosed? This condition may be diagnosed based on:  Your symptoms.  Your medical history.  An ear exam. During the exam, your health care provider will look into your ear with an instrument called an otoscope.  You may have tests, including a hearing test. How is this treated? This condition may be treated by:  Using ear drops to soften the earwax.  Having the earwax removed by a health care provider. The health care provider may: ? Flush the ear with water. ? Use an instrument that has a loop on the end (curette). ? Use a suction device.  Surgery to remove the wax buildup. This may be done in severe cases.  Follow these instructions at home:  Take over-the-counter and prescription medicines only as told by your health care provider.  Do not put any objects, including cotton swabs, into your ear. You can clean the opening of your ear canal with a washcloth or facial tissue.  Follow instructions from your health  care provider about cleaning your ears. Do not over-clean your ears.  Drink enough fluid to keep your urine clear or pale yellow. This will help to thin the earwax.  Keep all follow-up visits as told by your health care provider. If earwax builds up in your ears often or if you use hearing aids, consider seeing your health care provider for routine, preventive ear cleanings. Ask your health care provider how often you should schedule your cleanings.  If you have hearing aids, clean them according to instructions from the manufacturer and your health care provider. Contact a health care provider if:  You have ear pain.  You develop a fever.  You have blood, pus, or other fluid coming from your ear.  You have hearing loss.  You have ringing in your ears that does not go away.  Your symptoms do not improve with treatment.  You feel like the room is spinning (vertigo). Summary  Earwax can build up in the ear and cause discomfort or hearing loss.  The most common symptoms of this condition include reduced or muffled hearing and a feeling of fullness in the ear or feeling that the ear is plugged.  This condition may be diagnosed based on your symptoms, your medical history, and an ear exam.  This condition may be treated by using ear drops to soften the earwax or by having the earwax removed by a health care provider.  Do   not put any objects, including cotton swabs, into your ear. You can clean the opening of your ear canal with a washcloth or facial tissue. This information is not intended to replace advice given to you by your health care provider. Make sure you discuss any questions you have with your health care provider. Document Released: 11/24/2004 Document Revised: 12/28/2016 Document Reviewed: 12/28/2016 Elsevier Interactive Patient Education  2018 [employer]  

## 2018-07-06 MED ORDER — INSULIN NPH (HUMAN) (ISOPHANE) 100 UNIT/ML ~~LOC~~ SUSP: SUBCUTANEOUS | 3 refills | Status: DC

## 2018-07-06 NOTE — Telephone Encounter (Signed)
Called and spoke to [facility] and re sent the prescription for the pt. Pt is aware

## 2018-07-09 ENCOUNTER — Emergency Department (HOSPITAL_COMMUNITY): Payer: Medicare Other

## 2018-07-09 ENCOUNTER — Encounter (HOSPITAL_COMMUNITY): Payer: Self-pay | Admitting: Emergency Medicine

## 2018-07-09 ENCOUNTER — Inpatient Hospital Stay (HOSPITAL_COMMUNITY)
Admission: EM | Admit: 2018-07-09 | Discharge: 2018-07-12 | DRG: 552 | Disposition: A | Discharge status: Home / Self Care | Payer: Medicare Other | Attending: Internal Medicine | Admitting: Internal Medicine

## 2018-07-09 ENCOUNTER — Other Ambulatory Visit: Payer: Self-pay

## 2018-07-09 DIAGNOSIS — I25709 Atherosclerosis of coronary artery bypass graft(s), unspecified, with unspecified angina pectoris: Secondary | ICD-10-CM | POA: Diagnosis present

## 2018-07-09 DIAGNOSIS — I130 Hypertensive heart and chronic kidney disease with heart failure and stage 1 through stage 4 chronic kidney disease, or unspecified chronic kidney disease: Secondary | ICD-10-CM | POA: Diagnosis present

## 2018-07-09 DIAGNOSIS — Z9841 Cataract extraction status, right eye: Secondary | ICD-10-CM

## 2018-07-09 DIAGNOSIS — E669 Obesity, unspecified: Secondary | ICD-10-CM | POA: Diagnosis present

## 2018-07-09 DIAGNOSIS — Z833 Family history of diabetes mellitus: Secondary | ICD-10-CM

## 2018-07-09 DIAGNOSIS — M542 Cervicalgia: Secondary | ICD-10-CM | POA: Diagnosis not present

## 2018-07-09 DIAGNOSIS — Z86010 Personal history of colonic polyps: Secondary | ICD-10-CM

## 2018-07-09 DIAGNOSIS — Z8673 Personal history of transient ischemic attack (TIA), and cerebral infarction without residual deficits: Secondary | ICD-10-CM

## 2018-07-09 DIAGNOSIS — G250 Essential tremor: Secondary | ICD-10-CM | POA: Diagnosis present

## 2018-07-09 DIAGNOSIS — I440 Atrioventricular block, first degree: Secondary | ICD-10-CM | POA: Diagnosis present

## 2018-07-09 DIAGNOSIS — Z96653 Presence of artificial knee joint, bilateral: Secondary | ICD-10-CM | POA: Diagnosis present

## 2018-07-09 DIAGNOSIS — I4510 Unspecified right bundle-branch block: Secondary | ICD-10-CM | POA: Diagnosis not present

## 2018-07-09 DIAGNOSIS — M5412 Radiculopathy, cervical region: Secondary | ICD-10-CM | POA: Diagnosis present

## 2018-07-09 DIAGNOSIS — Z9842 Cataract extraction status, left eye: Secondary | ICD-10-CM

## 2018-07-09 DIAGNOSIS — E1122 Type 2 diabetes mellitus with diabetic chronic kidney disease: Secondary | ICD-10-CM | POA: Diagnosis present

## 2018-07-09 DIAGNOSIS — M4722 Other spondylosis with radiculopathy, cervical region: Secondary | ICD-10-CM | POA: Diagnosis present

## 2018-07-09 DIAGNOSIS — I5032 Chronic diastolic (congestive) heart failure: Secondary | ICD-10-CM | POA: Diagnosis not present

## 2018-07-09 DIAGNOSIS — Z820 Family history of epilepsy and other diseases of the nervous system: Secondary | ICD-10-CM

## 2018-07-09 DIAGNOSIS — Z825 Family history of asthma and other chronic lower respiratory diseases: Secondary | ICD-10-CM

## 2018-07-09 DIAGNOSIS — Z7982 Long term (current) use of aspirin: Secondary | ICD-10-CM

## 2018-07-09 DIAGNOSIS — N183 Chronic kidney disease, stage 3 (moderate): Secondary | ICD-10-CM | POA: Diagnosis present

## 2018-07-09 DIAGNOSIS — Z7902 Long term (current) use of antithrombotics/antiplatelets: Secondary | ICD-10-CM

## 2018-07-09 DIAGNOSIS — Z6841 Body Mass Index (BMI) 40.0 and over, adult: Secondary | ICD-10-CM | POA: Diagnosis not present

## 2018-07-09 DIAGNOSIS — M4712 Other spondylosis with myelopathy, cervical region: Secondary | ICD-10-CM | POA: Diagnosis not present

## 2018-07-09 DIAGNOSIS — M50123 Cervical disc disorder at C6-C7 level with radiculopathy: Secondary | ICD-10-CM | POA: Diagnosis not present

## 2018-07-09 DIAGNOSIS — Z887 Allergy status to serum and vaccine status: Secondary | ICD-10-CM

## 2018-07-09 DIAGNOSIS — M6281 Muscle weakness (generalized): Secondary | ICD-10-CM | POA: Diagnosis not present

## 2018-07-09 DIAGNOSIS — I2510 Atherosclerotic heart disease of native coronary artery without angina pectoris: Secondary | ICD-10-CM | POA: Diagnosis present

## 2018-07-09 DIAGNOSIS — E785 Hyperlipidemia, unspecified: Secondary | ICD-10-CM | POA: Diagnosis present

## 2018-07-09 DIAGNOSIS — F419 Anxiety disorder, unspecified: Secondary | ICD-10-CM | POA: Diagnosis present

## 2018-07-09 DIAGNOSIS — Z8249 Family history of ischemic heart disease and other diseases of the circulatory system: Secondary | ICD-10-CM

## 2018-07-09 DIAGNOSIS — G9389 Other specified disorders of brain: Secondary | ICD-10-CM | POA: Diagnosis not present

## 2018-07-09 DIAGNOSIS — Z794 Long term (current) use of insulin: Secondary | ICD-10-CM

## 2018-07-09 DIAGNOSIS — I6523 Occlusion and stenosis of bilateral carotid arteries: Secondary | ICD-10-CM | POA: Diagnosis not present

## 2018-07-09 DIAGNOSIS — K219 Gastro-esophageal reflux disease without esophagitis: Secondary | ICD-10-CM | POA: Diagnosis present

## 2018-07-09 DIAGNOSIS — G4733 Obstructive sleep apnea (adult) (pediatric): Secondary | ICD-10-CM | POA: Diagnosis present

## 2018-07-09 DIAGNOSIS — Z8042 Family history of malignant neoplasm of prostate: Secondary | ICD-10-CM

## 2018-07-09 DIAGNOSIS — I10 Essential (primary) hypertension: Secondary | ICD-10-CM | POA: Diagnosis present

## 2018-07-09 DIAGNOSIS — M4802 Spinal stenosis, cervical region: Secondary | ICD-10-CM | POA: Diagnosis present

## 2018-07-09 LAB — BASIC METABOLIC PANEL
ANION GAP: 6 (ref 5–15)
BUN: 14 mg/dL (ref 8–23)
CHLORIDE: 105 mmol/L (ref 98–111)
CO2: 29 mmol/L (ref 22–32)
Calcium: 8.6 mg/dL — ABNORMAL LOW (ref 8.9–10.3)
Creatinine, Ser: 1.11 mg/dL (ref 0.61–1.24)
GFR calc Af Amer: >60 mL/min (ref >60)
GLUCOSE: 182 mg/dL — AB (ref 70–99)
POTASSIUM: 3.9 mmol/L (ref 3.5–5.1)
Sodium: 140 mmol/L (ref 135–145)

## 2018-07-09 LAB — CBC
HEMATOCRIT: 38.3 % — AB (ref 39.0–52.0)
HEMOGLOBIN: 12.2 g/dL — AB (ref 13.0–17.0)
MCH: 28.6 pg (ref 26.0–34.0)
MCHC: 31.9 g/dL (ref 30.0–36.0)
MCV: 89.9 fL (ref 78.0–100.0)
Platelets: 229 10*3/uL (ref 150–400)
RBC: 4.26 MIL/uL (ref 4.22–5.81)
RDW: 13.5 % (ref 11.5–15.5)
WBC: 6.5 10*3/uL (ref 4.0–10.5)

## 2018-07-09 LAB — CBG MONITORING, ED
GLUCOSE-CAPILLARY: 209 mg/dL — AB (ref 70–99)
Glucose-Capillary: 72 mg/dL (ref 70–99)

## 2018-07-09 MED ORDER — IOPAMIDOL (ISOVUE-370) INJECTION 76%
50.0000 mL | Freq: Once | INTRAVENOUS | Status: AC | PRN
  Administered 2018-07-09: 50 mL via INTRAVENOUS

## 2018-07-09 MED ORDER — ACETAMINOPHEN 325 MG PO TABS
650.0000 mg | ORAL_TABLET | Freq: Once | ORAL | Status: AC
  Administered 2018-07-09: 650 mg via ORAL
  Filled 2018-07-09: qty 2

## 2018-07-09 MED ORDER — IOPAMIDOL (ISOVUE-370) INJECTION 76%
INTRAVENOUS | Status: AC
  Filled 2018-07-09: qty 50

## 2018-07-09 MED ORDER — LORAZEPAM 2 MG/ML IJ SOLN
1.0000 mg | Freq: Once | INTRAMUSCULAR | Status: AC
  Administered 2018-07-09: 1 mg via INTRAVENOUS
  Filled 2018-07-09: qty 1

## 2018-07-09 MED ORDER — ONDANSETRON 4 MG PO TBDP
4.0000 mg | ORAL_TABLET | Freq: Once | ORAL | Status: AC
  Administered 2018-07-09: 4 mg via ORAL
  Filled 2018-07-09: qty 1

## 2018-07-09 MED ORDER — OXYCODONE-ACETAMINOPHEN 5-325 MG PO TABS
2.0000 | ORAL_TABLET | Freq: Once | ORAL | Status: AC
  Administered 2018-07-09: 2 via ORAL
  Filled 2018-07-09: qty 2

## 2018-07-09 NOTE — ED Notes (Signed)
Pt given [place] sandwich and diet ginger ale, ok by PA

## 2018-07-09 NOTE — ED Notes (Signed)
Patient transported to MRI 

## 2018-07-09 NOTE — ED Notes (Signed)
Pt states he feels his sugar is getting low. Tech to check it.

## 2018-07-09 NOTE — ED Notes (Signed)
ED Provider at bedside. 

## 2018-07-09 NOTE — ED Provider Notes (Addendum)
[facility] EMERGENCY DEPARTMENT Provider Note   CSN: [id number] Arrival date & time: 07/09/18  6195     History   Chief Complaint Chief Complaint  Patient presents with  . Numbness  . Neck Pain    HPI Edward [name] is a 77 y.o. male presents emergency department chief complaint neck pain and bilateral upper and lower extremity weakness.  Patient complains of progressively worsening pain on the left side of his neck that has been developing over the past week.  He states that he thought was secondary to sleeping funny.  He has pain and tries to move his neck toward the left side.  He had some upper and lower extremity numbness and weakness however he has noticed over the past week that that has gotten much worse.  He is ambulatory.  He feels like he has been having less strength.  He denies facial droop, speech or swallowing, headaches, changes in vision.  Denies any injuries to the neck.  HPI  Past Medical History:  Diagnosis Date  . Adenomatous colon polyp 04/2011  . CAD (coronary artery disease)    a. 01/2015 DES to LAD  b. 12/2015: [place] 85% oLCx lesion--> rx therapy.  . Chronic diastolic CHF (congestive heart failure) ([place])    a. 05/2017 Echo: EF 60-65%, Gr1 DD, no rwma, mildly dil LA, nl RV fxn, mild TR.  . CKD (chronic kidney disease), stage II    a. probable CKD II-III with baseline CR 1.1-1.3.  . DM type 2 (diabetes mellitus, type 2), insulin dependent 01/29/2014   fasting cbg 50-120 with new regimen  . Dyslipidemia, goal LDL below 70 01/29/2014  . Enlarged prostate   . Essential tremor    a. on proprnolol  . GERD (gastroesophageal reflux disease)   . Hx of CABG   . Hypertension   . Internal hemorrhoid   . Sleep apnea    does not use cpap (06/04/2015)  . TIA (transient ischemic attack) 2002    Patient Active Problem List   Diagnosis Date Noted  . Radiculopathy 07/13/2018  . Cervical spinal stenosis 07/10/2018  . Cervical radiculopathy 07/10/2018  .  Cellulitis of left anterior lower leg 06/19/2018  . Numbness and tingling in both hands 06/19/2018  . Chronic daily headache 04/23/2018  . Palpitations 03/28/2018  . Anxiety 07/24/2017  . Atelectasis   . Coronary artery disease involving coronary bypass graft of native heart with angina pectoris ([place])   . Hypertensive heart disease without heart failure   . Chronic kidney disease, stage III (moderate) ([place]) 11/03/2016  . Chronic diastolic heart failure ([place])   . Near syncope 04/19/2016  . Diabetes mellitus ([place]) 03/11/2016  . GERD (gastroesophageal reflux disease) 08/27/2015  . Acute bronchitis 08/27/2015  . SOB (shortness of breath) 07/08/2015  . Obstructive sleep apnea 06/22/2015  . Essential tremor 04/23/2015  . Impacted cerumen of right ear 11/20/2014  . Hyperlipidemia LDL goal <70 01/29/2014  . Essential hypertension 01/29/2014    Past Surgical History:  Procedure Laterality Date  . BACK SURGERY    . CARDIAC CATHETERIZATION  01/28/14   + CAD treat medically  . CARDIAC CATHETERIZATION N/A 12/21/2015   Procedure: Right/Left Heart Cath and Coronary Angiography;  Surgeon: Belva Crome, MD;  Location: [facility];  Service: Cardiovascular;  Laterality: N/A;  . CARDIAC CATHETERIZATION N/A 11/02/2016   Procedure: Left Heart Cath and Coronary Angiography;  Surgeon: Nelva Bush, MD;  Location: [facility];  Service:  Cardiovascular;  Laterality: N/A;  . CATARACT EXTRACTION Bilateral   . CATARACT EXTRACTION, BILATERAL Bilateral   . CORONARY ANGIOPLASTY WITH STENT PLACEMENT  01/30/2015   DES [place] to LAD by Dr Tamala Julian  . CORONARY ARTERY BYPASS GRAFT N/A 11/09/2016   Procedure: CORONARY ARTERY BYPASS GRAFTING (CABG) x 2;  Surgeon: Melrose Nakayama, MD;  Location: [facility];  Service: Open Heart Surgery;  Laterality: N/A;  . ENDOVEIN HARVEST OF GREATER SAPHENOUS VEIN Left 11/09/2016   Procedure: ENDOVEIN HARVEST OF GREATER SAPHENOUS VEIN;  Surgeon: Melrose Nakayama, MD;  Location: [facility];  Service: Open Heart Surgery;  Laterality: Left;  . JOINT REPLACEMENT    . LEFT HEART CATH AND CORS/GRAFTS ANGIOGRAPHY N/A 03/09/2018   Procedure: LEFT HEART CATH AND CORS/GRAFTS ANGIOGRAPHY;  Surgeon: Belva Crome, MD;  Location: [facility];  Service: Cardiovascular;  Laterality: N/A;  . LEFT HEART CATHETERIZATION WITH CORONARY ANGIOGRAM N/A 01/28/2014   Procedure: LEFT HEART CATHETERIZATION WITH CORONARY ANGIOGRAM;  Surgeon: Sinclair Grooms, MD;  Location[facility];  Service: Cardiovascular;  Laterality: N/A;  . LEFT HEART CATHETERIZATION WITH CORONARY ANGIOGRAM N/A 01/30/2015   Procedure: LEFT HEART CATHETERIZATION WITH CORONARY ANGIOGRAM;  Surgeon: Belva Crome, MD;  Location: [facility] CATH LAB;  Service: Cardiovascular;  Laterality: N/A;  . LUMBAR [place]    . SHOULDER ARTHROSCOPY Right 07/30/2014   Procedure: Right Shoulder Arthroscopy, Debridement, Decompression, Manipulation Under Anesthesia;  Surgeon: Newt Minion, MD;  Location: [facility];  Service: Orthopedics;  Laterality: Right;  . TEE WITHOUT CARDIOVERSION N/A 11/09/2016   Procedure: TRANSESOPHAGEAL ECHOCARDIOGRAM (TEE);  Surgeon: Melrose Nakayama, MD;  Location: [facility];  Service: Open Heart Surgery;  Laterality: N/A;  . TOTAL KNEE ARTHROPLASTY Bilateral         Home Medications    Prior to Admission medications   Medication Sig Start Date End Date Taking? Authorizing Provider  albuterol (VENTOLIN HFA) 108 (90 Base) MCG/ACT inhaler inhale 2 puffs by mouth every 6 hours if needed for wheezing or shortness of breath Patient not taking: Reported on 07/13/2018 07/24/17  Yes Lowne Lyndal Pulley R, DO  amLODipine (NORVASC) 10 MG tablet Take 1 tablet (10 mg total) by mouth daily. 03/13/18  Yes Roma Schanz R, DO  bismuth subsalicylate (PEPTO BISMOL) 262 MG/15ML suspension Take 30 mLs by mouth as needed for indigestion.   Yes [provider]  clonazePAM (KLONOPIN) 1 MG tablet  Take 1 tablet (1 mg total) by mouth at bedtime. Patient taking differently: Take 1 mg by mouth at bedtime as needed (for sleep or anxiety).  03/13/18  Yes Ann Held, DO  cyclobenzaprine (FLEXERIL) 5 MG tablet Take 1 tablet (5 mg total) by mouth at bedtime as needed for muscle spasms (headache). Patient taking differently: Take 5 mg by mouth at bedtime as needed (for muscle spasms, leg cramps, or headaches).  04/23/18  Yes Patel, Donika K, DO  dicyclomine (BENTYL) 10 MG capsule Take 10 mg by mouth 2 (two) times daily.  01/01/16  Yes [provider]  esomeprazole (NEXIUM) 40 MG capsule Take 40 mg by mouth daily as needed (for reflux).  01/01/16  Yes [provider]  furosemide (LASIX) 40 MG tablet Take 1.5 tablets (60 mg total) by mouth daily. 03/29/18 07/13/18 Yes Duke, Tami Lin, PA  gabapentin (NEURONTIN) 300 MG capsule take 1 capsule by mouth at bedtime for 1 week then INCREASE TO 1 CAP TWICE DAILY Patient taking  differently: Take 300 mg by mouth 2 (two) times daily.  03/13/18  Yes Roma Schanz R, DO  hydrALAZINE (APRESOLINE) 25 MG tablet Take 1 tablet (25 mg total) by mouth 3 (three) times daily. 03/28/18 07/13/18 Yes Duke, Tami Lin, PA  insulin aspart (NOVOLOG) 100 UNIT/ML injection Inject 40 Units into the skin 3 (three) times daily before meals. 06/25/18  Yes Philemon Kingdom, MD  insulin NPH Human (NOVOLIN N RELION) 100 UNIT/ML injection Inject 30 Units into the skin 2 (two) times daily before a meal.    Yes [provider]  meclizine (ANTIVERT) 25 MG tablet Take 1 tablet (25 mg total) by mouth 2 (two) times daily as needed for dizziness. 06/10/17  Yes Theora Gianotti, NP  nitroGLYCERIN (NITROSTAT) 0.4 MG SL tablet Place 1 tablet (0.4 mg total) under the tongue every 5 (five) minutes as needed for chest pain. Max: 3 doses per episode. Patient taking differently: Place 0.4-0.8 mg under the tongue every 5 (five) minutes x 3 doses as needed for  chest pain.  11/07/17  Yes Burtis Junes, NP  Polyvinyl Alcohol-Povidone (REFRESH OP) Place 1 drop into both eyes 3 (three) times daily as needed (for dry eyes). Reported on 12/25/2015   Yes [provider]  potassium chloride SA (K-DUR,KLOR-CON) 20 MEQ tablet TAKE 1 & 1/2 (ONE & ONE-HALF) TABLETS BY MOUTH ONCE DAILY Patient taking differently: Take 30 mEq by mouth daily.  04/27/18  Yes Duke, Tami Lin, PA  prednisoLONE acetate (PRED FORTE) 1 % ophthalmic suspension Place 2 drops into both eyes 2 (two) times daily as needed for itching.   Yes [provider]  primidone (MYSOLINE) 50 MG tablet Take 1 tablet (50 mg total) by mouth 2 (two) times daily. 04/23/18  Yes Patel, Donika K, DO  propranolol (INDERAL) 40 MG tablet Take 1 tablet (40 mg total) by mouth 3 (three) times daily. 03/13/18  Yes Roma Schanz R, DO  rosuvastatin (CRESTOR) 20 MG tablet Take 1 tablet (20 mg total) by mouth daily. Patient taking differently: Take 20 mg by mouth 2 (two) times a week.  03/13/18 03/08/19 Yes Lowne Koren Shiver, DO  aspirin EC 81 MG tablet Take 1 tablet (81 mg total) by mouth daily. Hold aspirin for 5 days for the surgery, restart it as per neuro surgery. 07/11/18   Hosie Poisson, MD  clopidogrel (PLAVIX) 75 MG tablet Take 1 tablet (75 mg total) by mouth daily. Hold plavix for the surgery, restart it as per neuro surgery. 07/11/18   Hosie Poisson, MD  diclofenac sodium (VOLTAREN) 1 % GEL Apply 2 g topically 4 (four) times daily as needed (for pain).     [provider]  erythromycin ophthalmic ointment Place 1 application into both eyes See admin instructions. Apply to both eyelids once a day as needed for itching    [provider]  Insulin Syringe-Needle U-100 (BD INSULIN SYRINGE U/F) 31G X 5/16" 1 ML MISC Use 4 times per day to inject insulin. 01/16/18   Philemon Kingdom, MD  oxyCODONE-acetaminophen (PERCOCET/ROXICET) 5-325 MG tablet Take 1-2 tablets by mouth every 4  (four) hours as needed for moderate pain or severe pain. 07/11/18   Hosie Poisson, MD  polyethylene glycol (MIRALAX / GLYCOLAX) packet Take 17 g by mouth daily. Patient taking differently: Take 17 g by mouth daily as needed for mild constipation.  07/12/18   Hosie Poisson, MD  senna-docusate (SENOKOT-S) 8.6-50 MG tablet Take 2 tablets by mouth 2 (two) times  daily. 07/12/18   Hosie Poisson, MD    Family History Family History  Problem Relation Age of Onset  . CAD Brother        2 brothers - CABG  . Alzheimer's disease Sister   . CAD Sister   . Prostate cancer Brother   . Asthma Brother        2 brothers   . Diabetes Other        entire family  . Colon cancer Neg Hx     Social History Social History   Tobacco Use  . Smoking status: Never Smoker  . Smokeless tobacco: Never Used  Substance Use Topics  . Alcohol use: No    Alcohol/week: 0.0 standard drinks  . Drug use: No     Allergies   Pneumococcal vaccines   Review of Systems Review of Systems  Ten systems reviewed and are negative for acute change, except as noted in the HPI.   Physical Exam Updated Vital Signs BP 124/67 (BP Location: Right Arm)   Pulse (!) 59   Temp (!) 97.5 F (36.4 C) (Oral)   Resp 18   Ht 5\' 8"  (1.727 m)   Wt 127.8 kg   SpO2 100%   BMI 42.84 kg/m   Physical Exam  Constitutional: He is oriented to person, place, and time. He appears well-developed and well-nourished. No distress.  HENT:  Head: Normocephalic and atraumatic.  Eyes: Conjunctivae are normal. No scleral icterus.  Neck: Normal range of motion. Neck supple.  Cardiovascular: Normal rate, regular rhythm and normal heart sounds.  Pulmonary/Chest: Effort normal and breath sounds normal. No respiratory distress.  Abdominal: Soft. There is no tenderness.  Musculoskeletal: He exhibits no edema.  Neurological: He is alert and oriented to person, place, and time.  Speech is clear and goal oriented, follows commands Major Cranial  nerves without deficit, no facial droop Strength 5 /5 in the left upper and lower extremity.  4 5 on the right upper and lower extremity.  Difficult to assess if this is poor effort.  Sensation normal to light and sharp touch Moves extremities without ataxia, coordination intact Normal finger to nose and rapid alternating movements Neg romberg, no pronator drift Antalgic appearing gait    Skin: Skin is warm and dry. He is not diaphoretic.  Psychiatric: His behavior is normal.  Nursing note and vitals reviewed.    ED Treatments / Results  Labs (all labs ordered are listed, but only abnormal results are displayed) Labs Reviewed  BASIC METABOLIC PANEL - Abnormal; Notable for the following components:      Result Value   Glucose, Bld 182 (*)    Calcium 8.6 (*)    All other components within normal limits  CBC - Abnormal; Notable for the following components:   Hemoglobin 12.2 (*)    HCT 38.3 (*)    All other components within normal limits  BASIC METABOLIC PANEL - Abnormal; Notable for the following components:   Glucose, Bld 335 (*)    Calcium 8.2 (*)    All other components within normal limits  GLUCOSE, CAPILLARY - Abnormal; Notable for the following components:   Glucose-Capillary 264 (*)    All other components within normal limits  GLUCOSE, CAPILLARY - Abnormal; Notable for the following components:   Glucose-Capillary 225 (*)    All other components within normal limits  GLUCOSE, CAPILLARY - Abnormal; Notable for the following components:   Glucose-Capillary 231 (*)    All other components within normal  limits  GLUCOSE, CAPILLARY - Abnormal; Notable for the following components:   Glucose-Capillary 229 (*)    All other components within normal limits  GLUCOSE, CAPILLARY - Abnormal; Notable for the following components:   Glucose-Capillary 257 (*)    All other components within normal limits  GLUCOSE, CAPILLARY - Abnormal; Notable for the following components:    Glucose-Capillary 262 (*)    All other components within normal limits  GLUCOSE, CAPILLARY - Abnormal; Notable for the following components:   Glucose-Capillary 294 (*)    All other components within normal limits  GLUCOSE, CAPILLARY - Abnormal; Notable for the following components:   Glucose-Capillary 243 (*)    All other components within normal limits  GLUCOSE, CAPILLARY - Abnormal; Notable for the following components:   Glucose-Capillary 167 (*)    All other components within normal limits  BASIC METABOLIC PANEL - Abnormal; Notable for the following components:   Glucose, Bld 246 (*)    Calcium 8.6 (*)    GFR calc non Af Amer 58 (*)    All other components within normal limits  GLUCOSE, CAPILLARY - Abnormal; Notable for the following components:   Glucose-Capillary >600 (*)    All other components within normal limits  GLUCOSE, CAPILLARY - Abnormal; Notable for the following components:   Glucose-Capillary 275 (*)    All other components within normal limits  GLUCOSE, CAPILLARY - Abnormal; Notable for the following components:   Glucose-Capillary 277 (*)    All other components within normal limits  GLUCOSE, CAPILLARY - Abnormal; Notable for the following components:   Glucose-Capillary 210 (*)    All other components within normal limits  GLUCOSE, CAPILLARY - Abnormal; Notable for the following components:   Glucose-Capillary 148 (*)    All other components within normal limits  GLUCOSE, CAPILLARY - Abnormal; Notable for the following components:   Glucose-Capillary 260 (*)    All other components within normal limits  CBG MONITORING, ED - Abnormal; Notable for the following components:   Glucose-Capillary 209 (*)    All other components within normal limits  CBG MONITORING, ED - Abnormal; Notable for the following components:   Glucose-Capillary 336 (*)    All other components within normal limits  PROTIME-INR  CBG MONITORING, ED    EKG EKG  Interpretation  Date/Time:  Monday July 09 2018 09:37:40 EDT Ventricular Rate:  73 PR Interval:  258 QRS Duration: 94 QT Interval:  416 QTC Calculation: 458 R Axis:   51 Text Interpretation:  Sinus rhythm with sinus arrhythmia with 1st degree A-V block Incomplete right bundle branch block Septal infarct , age undetermined Abnormal ECG No significant change since last tracing Confirmed by Deno Etienne [phone]) on 07/09/2018 5:32:57 PM   Radiology Mr Brain Wo Contrast  Result Date: 07/13/2018 CLINICAL DATA:  Initial evaluation for acute dizziness. EXAM: MRI HEAD WITHOUT CONTRAST TECHNIQUE: Multiplanar, multiecho pulse sequences of the brain and surrounding structures were obtained without intravenous contrast. COMPARISON:  Prior MRI from 07/09/2018. FINDINGS: Brain: Mild atrophy with chronic small vessel ischemic disease. Small remote lacunar infarct present within the right thalamus. No acute intracranial infarct. No evidence for acute or chronic intracranial hemorrhage. No mass lesion, midline shift or mass effect. No hydrocephalus. No extra-axial fluid collection. Pituitary gland within normal limits. Vascular: Major intracranial vascular flow voids maintained. Skull and upper cervical spine: Craniocervical junction within normal limits. Upper cervical spine normal. No focal marrow replacing lesion. Scalp soft tissues demonstrate no acute finding. Sinuses/Orbits: Sequelae of prior  ocular lens replacement bilaterally. Paranasal sinuses clear. Small bilateral mastoid effusions noted, of doubtful significance. Other: None. IMPRESSION: 1. No acute intracranial abnormality. 2. Mild atrophy with chronic small vessel ischemic disease, with small remote right thalamic lacunar infarct, stable. Electronically Signed   By: Jeannine Boga M.D.   On: 07/13/2018 20:08    Procedures Procedures (including critical care time)  Medications Ordered in ED Medications  acetaminophen (TYLENOL) tablet 650 mg  (650 mg Oral Given 07/09/18 1253)  oxyCODONE-acetaminophen (PERCOCET/ROXICET) 5-325 MG per tablet 2 tablet (2 tablets Oral Given 07/09/18 1743)  ondansetron (ZOFRAN-ODT) disintegrating tablet 4 mg (4 mg Oral Given 07/09/18 1743)  iopamidol (ISOVUE-370) 76 % injection 50 mL (50 mLs Intravenous Contrast Given 07/09/18 1939)  LORazepam (ATIVAN) injection 1 mg (1 mg Intravenous Given 07/09/18 2315)  gadobutrol (GADAVIST) 1 MMOL/ML injection 10 mL (10 mLs Intravenous Contrast Given 07/10/18 0032)     Initial Impression / Assessment and Plan / ED Course  I have reviewed the triage vital signs and the nursing notes.  Pertinent labs & imaging results that were available during my care of the patient were reviewed by me and considered in my medical decision making (see chart for details).  Clinical Course as of Jul 14 906  Mon Jul 09, 2018  2215 Spoke with Dr. Cheral Marker from neurology, will obtain MRI with and without of head and C-spine.  If patient is still having his weakness when these are obtained and they are negative then that effectively rules out stroke, and TIA.  If his weakness resolves before these are completed, then he really require admission for TIA work-up.   [EH]  Tue Jul 10, 2018  0128 Discussed with Dr. Zada Finders from neurosurgery who recommended medicine admit.    [EH]  0139 Spoke with Dr. Myna Hidalgo who will come see patient.    [EH]    Clinical Course User Index [EH] Lorin Glass, PA-C    Patient with neck pain, numbness and weakness of the right upper extremity and lower extremity.  I suspect acute torticollis however his CT scans are currently pending.  Seen and shared visit with Dr. Tyrone Nine.  Patient likely can be discharged after the scans return.  He has outpatient neurology follow-up.  Given pain medication for treatment.  I discussed the patient went PA Phylliss Bob who will assume care for appropriate disposition.  Final Clinical Impressions(s) / ED Diagnoses   Final diagnoses:   Neck pain    ED Discharge Orders         Ordered    Diet - low sodium heart healthy     07/12/18 1028    Discharge instructions    Comments:  Follow up with neuro surgery as recommended.  You are scheduled to have surgery on 9/16, please hold aspirin and plavix for the next 4 days and restart it as per your neuro surgery.   07/12/18 1028    polyethylene glycol (MIRALAX / GLYCOLAX) packet  Daily     07/12/18 1102    senna-docusate (SENOKOT-S) 8.6-50 MG tablet  2 times daily     07/12/18 1102    aspirin EC 81 MG tablet  Daily     07/11/18 1210    clopidogrel (PLAVIX) 75 MG tablet  Daily     07/11/18 1210    oxyCODONE-acetaminophen (PERCOCET/ROXICET) 5-325 MG tablet  Every 4 hours PRN     07/11/18 1210    senna-docusate (SENOKOT-S) 8.6-50 MG tablet  At bedtime PRN,   Status:  Discontinued     07/11/18 1210    Diet - low sodium heart healthy     07/11/18 1210    Discharge instructions    Comments:  [place] till the surgery and restart them asper neuro surgery recommendations.  Please follow up with neuro surgery for the surgery on 9/16.   07/11/18 [place], [place], PA-C 07/09/18 2130    Deno Etienne, DO 07/09/18 [place], [place], PA-C 07/14/18 [place], [place], DO 07/14/18 1512

## 2018-07-09 NOTE — ED Notes (Signed)
Ok'd pt to eat [place] sandwich and sprite while in waiting room, due to drop in blood sugar, pt c/o blurred vision and hunger. Takes insulin. Will continue to monitor.

## 2018-07-09 NOTE — ED Notes (Signed)
Patient currently at CT scan .  

## 2018-07-09 NOTE — ED Triage Notes (Signed)
Pt reports left neck pain that started 1 week ago and hurts worse with movement, pt also states he has had left hand numbness since Monday. No weakness noted.

## 2018-07-10 ENCOUNTER — Encounter (HOSPITAL_COMMUNITY): Payer: Self-pay | Admitting: Family Medicine

## 2018-07-10 ENCOUNTER — Other Ambulatory Visit: Payer: Self-pay

## 2018-07-10 DIAGNOSIS — M50123 Cervical disc disorder at C6-C7 level with radiculopathy: Secondary | ICD-10-CM | POA: Diagnosis present

## 2018-07-10 DIAGNOSIS — I25709 Atherosclerosis of coronary artery bypass graft(s), unspecified, with unspecified angina pectoris: Secondary | ICD-10-CM

## 2018-07-10 DIAGNOSIS — M4802 Spinal stenosis, cervical region: Secondary | ICD-10-CM | POA: Diagnosis present

## 2018-07-10 DIAGNOSIS — I10 Essential (primary) hypertension: Secondary | ICD-10-CM

## 2018-07-10 DIAGNOSIS — Z6841 Body Mass Index (BMI) 40.0 and over, adult: Secondary | ICD-10-CM | POA: Diagnosis not present

## 2018-07-10 DIAGNOSIS — M4712 Other spondylosis with myelopathy, cervical region: Secondary | ICD-10-CM | POA: Diagnosis present

## 2018-07-10 DIAGNOSIS — Z8673 Personal history of transient ischemic attack (TIA), and cerebral infarction without residual deficits: Secondary | ICD-10-CM | POA: Diagnosis not present

## 2018-07-10 DIAGNOSIS — K219 Gastro-esophageal reflux disease without esophagitis: Secondary | ICD-10-CM | POA: Diagnosis present

## 2018-07-10 DIAGNOSIS — Z9842 Cataract extraction status, left eye: Secondary | ICD-10-CM | POA: Diagnosis not present

## 2018-07-10 DIAGNOSIS — I130 Hypertensive heart and chronic kidney disease with heart failure and stage 1 through stage 4 chronic kidney disease, or unspecified chronic kidney disease: Secondary | ICD-10-CM | POA: Diagnosis present

## 2018-07-10 DIAGNOSIS — E785 Hyperlipidemia, unspecified: Secondary | ICD-10-CM | POA: Diagnosis present

## 2018-07-10 DIAGNOSIS — G250 Essential tremor: Secondary | ICD-10-CM | POA: Diagnosis present

## 2018-07-10 DIAGNOSIS — I2510 Atherosclerotic heart disease of native coronary artery without angina pectoris: Secondary | ICD-10-CM | POA: Diagnosis present

## 2018-07-10 DIAGNOSIS — F419 Anxiety disorder, unspecified: Secondary | ICD-10-CM | POA: Diagnosis present

## 2018-07-10 DIAGNOSIS — R29898 Other symptoms and signs involving the musculoskeletal system: Secondary | ICD-10-CM | POA: Diagnosis not present

## 2018-07-10 DIAGNOSIS — Z8249 Family history of ischemic heart disease and other diseases of the circulatory system: Secondary | ICD-10-CM | POA: Diagnosis not present

## 2018-07-10 DIAGNOSIS — M542 Cervicalgia: Secondary | ICD-10-CM | POA: Diagnosis not present

## 2018-07-10 DIAGNOSIS — Z7982 Long term (current) use of aspirin: Secondary | ICD-10-CM | POA: Diagnosis not present

## 2018-07-10 DIAGNOSIS — I4510 Unspecified right bundle-branch block: Secondary | ICD-10-CM | POA: Diagnosis present

## 2018-07-10 DIAGNOSIS — N183 Chronic kidney disease, stage 3 (moderate): Secondary | ICD-10-CM | POA: Diagnosis present

## 2018-07-10 DIAGNOSIS — E1122 Type 2 diabetes mellitus with diabetic chronic kidney disease: Secondary | ICD-10-CM | POA: Diagnosis present

## 2018-07-10 DIAGNOSIS — I5032 Chronic diastolic (congestive) heart failure: Secondary | ICD-10-CM | POA: Diagnosis present

## 2018-07-10 DIAGNOSIS — M5412 Radiculopathy, cervical region: Secondary | ICD-10-CM

## 2018-07-10 DIAGNOSIS — Z96653 Presence of artificial knee joint, bilateral: Secondary | ICD-10-CM | POA: Diagnosis present

## 2018-07-10 DIAGNOSIS — I440 Atrioventricular block, first degree: Secondary | ICD-10-CM | POA: Diagnosis present

## 2018-07-10 DIAGNOSIS — G9389 Other specified disorders of brain: Secondary | ICD-10-CM | POA: Diagnosis not present

## 2018-07-10 DIAGNOSIS — Z7902 Long term (current) use of antithrombotics/antiplatelets: Secondary | ICD-10-CM | POA: Diagnosis not present

## 2018-07-10 DIAGNOSIS — Z9841 Cataract extraction status, right eye: Secondary | ICD-10-CM | POA: Diagnosis not present

## 2018-07-10 DIAGNOSIS — M4722 Other spondylosis with radiculopathy, cervical region: Secondary | ICD-10-CM | POA: Diagnosis present

## 2018-07-10 LAB — BASIC METABOLIC PANEL
ANION GAP: 7 (ref 5–15)
BUN: 10 mg/dL (ref 8–23)
CO2: 27 mmol/L (ref 22–32)
Calcium: 8.2 mg/dL — ABNORMAL LOW (ref 8.9–10.3)
Chloride: 102 mmol/L (ref 98–111)
Creatinine, Ser: 1.06 mg/dL (ref 0.61–1.24)
GFR calc Af Amer: >60 mL/min (ref >60)
GLUCOSE: 335 mg/dL — AB (ref 70–99)
Potassium: 4.2 mmol/L (ref 3.5–5.1)
SODIUM: 136 mmol/L (ref 135–145)

## 2018-07-10 LAB — GLUCOSE, CAPILLARY
GLUCOSE-CAPILLARY: 231 mg/dL — AB (ref 70–99)
GLUCOSE-CAPILLARY: 262 mg/dL — AB (ref 70–99)
Glucose-Capillary: 225 mg/dL — ABNORMAL HIGH (ref 70–99)
Glucose-Capillary: 264 mg/dL — ABNORMAL HIGH (ref 70–99)
Glucose-Capillary: 229 mg/dL — ABNORMAL HIGH (ref 70–99)
Glucose-Capillary: 257 mg/dL — ABNORMAL HIGH (ref 70–99)

## 2018-07-10 LAB — PROTIME-INR
INR: 1.09
Prothrombin Time: 14 seconds (ref 11.4–15.2)

## 2018-07-10 LAB — CBG MONITORING, ED: GLUCOSE-CAPILLARY: 336 mg/dL — AB (ref 70–99)

## 2018-07-10 MED ORDER — HYDRALAZINE HCL 50 MG PO TABS
50.0000 mg | ORAL_TABLET | Freq: Three times a day (TID) | ORAL | Status: DC
  Administered 2018-07-10 – 2018-07-12 (×6): 50 mg via ORAL
  Filled 2018-07-10 (×6): qty 1

## 2018-07-10 MED ORDER — TAMSULOSIN HCL 0.4 MG PO CAPS
0.4000 mg | ORAL_CAPSULE | Freq: Every day | ORAL | Status: DC
  Administered 2018-07-10 – 2018-07-12 (×3): 0.4 mg via ORAL
  Filled 2018-07-10 (×3): qty 1

## 2018-07-10 MED ORDER — ISOSORBIDE MONONITRATE ER 30 MG PO TB24: 60.0000 mg | ORAL_TABLET | Freq: Every day | ORAL | Status: DC

## 2018-07-10 MED ORDER — POLYVINYL ALCOHOL 1.4 % OP SOLN: 1.0000 [drp] | Freq: Three times a day (TID) | OPHTHALMIC | Status: DC | PRN

## 2018-07-10 MED ORDER — SODIUM CHLORIDE 0.9% FLUSH: 3.0000 mL | INTRAVENOUS | Status: DC | PRN

## 2018-07-10 MED ORDER — INSULIN ASPART 100 UNIT/ML ~~LOC~~ SOLN
0.0000 [IU] | SUBCUTANEOUS | Status: DC
  Administered 2018-07-10: 5 [IU] via SUBCUTANEOUS
  Administered 2018-07-10: 3 [IU] via SUBCUTANEOUS
  Administered 2018-07-10: 7 [IU] via SUBCUTANEOUS
  Administered 2018-07-10: 5 [IU] via SUBCUTANEOUS
  Administered 2018-07-11: 2 [IU] via SUBCUTANEOUS
  Administered 2018-07-11 (×2): 5 [IU] via SUBCUTANEOUS
  Administered 2018-07-11: 3 [IU] via SUBCUTANEOUS
  Administered 2018-07-11: 5 [IU] via SUBCUTANEOUS
  Administered 2018-07-12: 1 [IU] via SUBCUTANEOUS
  Administered 2018-07-12: 5 [IU] via SUBCUTANEOUS
  Filled 2018-07-10: qty 1

## 2018-07-10 MED ORDER — GADOBUTROL 1 MMOL/ML IV SOLN
10.0000 mL | Freq: Once | INTRAVENOUS | Status: AC | PRN
  Administered 2018-07-10: 10 mL via INTRAVENOUS

## 2018-07-10 MED ORDER — PRIMIDONE 50 MG PO TABS
50.0000 mg | ORAL_TABLET | Freq: Two times a day (BID) | ORAL | Status: DC
  Administered 2018-07-10 – 2018-07-12 (×5): 50 mg via ORAL
  Filled 2018-07-10 (×5): qty 1

## 2018-07-10 MED ORDER — SODIUM CHLORIDE 0.9 % IV SOLN: 250.0000 mL | INTRAVENOUS | Status: DC | PRN

## 2018-07-10 MED ORDER — PANTOPRAZOLE SODIUM 40 MG PO TBEC
40.0000 mg | DELAYED_RELEASE_TABLET | Freq: Every day | ORAL | Status: DC
  Administered 2018-07-10 – 2018-07-12 (×3): 40 mg via ORAL
  Filled 2018-07-10 (×3): qty 1

## 2018-07-10 MED ORDER — BISACODYL 5 MG PO TBEC: 5.0000 mg | DELAYED_RELEASE_TABLET | Freq: Every day | ORAL | Status: DC | PRN

## 2018-07-10 MED ORDER — SODIUM CHLORIDE 0.9% FLUSH
3.0000 mL | Freq: Two times a day (BID) | INTRAVENOUS | Status: DC
  Administered 2018-07-10 – 2018-07-11 (×3): 3 mL via INTRAVENOUS

## 2018-07-10 MED ORDER — ACETAMINOPHEN 650 MG RE SUPP: 650.0000 mg | Freq: Four times a day (QID) | RECTAL | Status: DC | PRN

## 2018-07-10 MED ORDER — ROSUVASTATIN CALCIUM 20 MG PO TABS
20.0000 mg | ORAL_TABLET | ORAL | Status: DC
  Administered 2018-07-12: 20 mg via ORAL
  Filled 2018-07-10: qty 1

## 2018-07-10 MED ORDER — SENNOSIDES-DOCUSATE SODIUM 8.6-50 MG PO TABS
1.0000 | ORAL_TABLET | Freq: Every evening | ORAL | Status: DC | PRN
  Administered 2018-07-11: 1 via ORAL
  Filled 2018-07-10: qty 1

## 2018-07-10 MED ORDER — GABAPENTIN 300 MG PO CAPS
300.0000 mg | ORAL_CAPSULE | Freq: Two times a day (BID) | ORAL | Status: DC
  Administered 2018-07-10 – 2018-07-12 (×5): 300 mg via ORAL
  Filled 2018-07-10 (×5): qty 1

## 2018-07-10 MED ORDER — MORPHINE SULFATE (PF) 2 MG/ML IV SOLN
1.0000 mg | INTRAVENOUS | Status: DC | PRN
  Administered 2018-07-10 (×2): 2 mg via INTRAVENOUS
  Administered 2018-07-10: 1 mg via INTRAVENOUS
  Administered 2018-07-10 – 2018-07-11 (×3): 2 mg via INTRAVENOUS
  Filled 2018-07-10 (×5): qty 1

## 2018-07-10 MED ORDER — ACETAMINOPHEN 325 MG PO TABS: 650.0000 mg | ORAL_TABLET | Freq: Four times a day (QID) | ORAL | Status: DC | PRN

## 2018-07-10 MED ORDER — ONDANSETRON HCL 4 MG/2ML IJ SOLN: 4.0000 mg | Freq: Four times a day (QID) | INTRAMUSCULAR | Status: DC | PRN

## 2018-07-10 MED ORDER — AMLODIPINE BESYLATE 5 MG PO TABS
10.0000 mg | ORAL_TABLET | Freq: Every day | ORAL | Status: DC
  Administered 2018-07-10 – 2018-07-12 (×3): 10 mg via ORAL
  Filled 2018-07-10 (×3): qty 2

## 2018-07-10 MED ORDER — PROPRANOLOL HCL 40 MG PO TABS
40.0000 mg | ORAL_TABLET | Freq: Three times a day (TID) | ORAL | Status: DC
  Administered 2018-07-10 – 2018-07-12 (×7): 40 mg via ORAL
  Filled 2018-07-10: qty 1
  Filled 2018-07-10 (×3): qty 2
  Filled 2018-07-10 (×2): qty 1
  Filled 2018-07-10 (×2): qty 2
  Filled 2018-07-10 (×2): qty 1
  Filled 2018-07-10: qty 2
  Filled 2018-07-10 (×3): qty 1

## 2018-07-10 MED ORDER — CYCLOBENZAPRINE HCL 5 MG PO TABS
5.0000 mg | ORAL_TABLET | Freq: Every evening | ORAL | Status: DC | PRN
  Administered 2018-07-10: 5 mg via ORAL
  Filled 2018-07-10: qty 1

## 2018-07-10 MED ORDER — DICYCLOMINE HCL 10 MG PO CAPS
10.0000 mg | ORAL_CAPSULE | Freq: Two times a day (BID) | ORAL | Status: DC
  Administered 2018-07-10 – 2018-07-12 (×5): 10 mg via ORAL
  Filled 2018-07-10 (×5): qty 1

## 2018-07-10 MED ORDER — MORPHINE SULFATE (PF) 2 MG/ML IV SOLN
INTRAVENOUS | Status: AC
  Filled 2018-07-10: qty 1

## 2018-07-10 MED ORDER — HYDRALAZINE HCL 25 MG PO TABS
25.0000 mg | ORAL_TABLET | Freq: Three times a day (TID) | ORAL | Status: DC
  Administered 2018-07-10: 25 mg via ORAL
  Filled 2018-07-10: qty 1

## 2018-07-10 MED ORDER — ONDANSETRON HCL 4 MG PO TABS: 4.0000 mg | ORAL_TABLET | Freq: Four times a day (QID) | ORAL | Status: DC | PRN

## 2018-07-10 MED ORDER — ALBUTEROL SULFATE (2.5 MG/3ML) 0.083% IN NEBU: 3.0000 mL | INHALATION_SOLUTION | Freq: Four times a day (QID) | RESPIRATORY_TRACT | Status: DC | PRN

## 2018-07-10 MED ORDER — CLONAZEPAM 1 MG PO TABS: 1.0000 mg | ORAL_TABLET | Freq: Every evening | ORAL | Status: DC | PRN

## 2018-07-10 MED ORDER — INSULIN DETEMIR 100 UNIT/ML ~~LOC~~ SOLN
40.0000 [IU] | Freq: Two times a day (BID) | SUBCUTANEOUS | Status: DC
  Administered 2018-07-10 – 2018-07-12 (×5): 40 [IU] via SUBCUTANEOUS
  Filled 2018-07-10 (×6): qty 0.4

## 2018-07-10 NOTE — Consult Note (Addendum)
Cardiology Consultation:   Patient ID: Edward [name] MRN: [id number]; DOB: 1941-10-16  Admit date: 07/09/2018 Date of Consult: 07/10/2018  Primary Care Provider: Ann Held, DO Primary Cardiologist: Sinclair Grooms, MD  Primary Electrophysiologist:  None   Patient Profile:   Edward [name] is a 77 y.o. male with a hx of CAD s/p CABG 12/4194, chronic diastlic HF, HTN, HLD, DM-2, CKD stage 3, and OSA who is was admitted early AM hours for neck pain and Rt arm weakness and numbness for  being seen today for the evaluation of pre-op eval for possible surgery due to cervical spondylosis with cord compression, radiculopathy at the request of Dr. Bonner Puna.  History of Present Illness:   Edward [name] with above hx presented to ER with neck pain and Rt arm weakness.  + cervical spondylosis with cord compression -neuro surgery to see may require surgery.    Pt was recently hospitalized in 03/08/18 with chest pain.  He did have cardiac cath which revealed 60% proximal LAD stenosis, widely patent mid LAD stent, patent LM, totally occluded ostial circumflex to proximal circumflex, moderate nonobstructive disease along RCA and patent SVG-OM2 and patent LIMA-dLAD. There was no identifiable culprit for his presentation and it was recommended to consider a MSK or neuropathic etiology as the source of his discomfort. He had cardiac CT which was neg for PE.  He did have neuropathic pain and he continue on gabapentin.  He did return to ER 03/24/18 for fluttering.  He wore a heart monitor and this revealed SR with 1st degree AV block and PACs.  No a fib.   On Admit  EKG SR with 1st degree AV block incomplete RBBB PR 258 ms.  I personally reviewed.    Na 136, K+ 4.2, glucose 335, Cr 1.06 INR 1.09  Hgb 12.2 plts 229 WBC 6.5   Currently no chest pain or SOB,  He is able to walk up 2 flights of steps with chest pain.  He is writing a book currently.   His BP is elevated now and was not earlier.  Ranged from  134/60 to 187/104   Not on tele.  Pt has been on ASA plavix but currently not taking for possible surgery.  Past Medical History:  Diagnosis Date  . Adenomatous colon polyp 04/2011  . CAD (coronary artery disease)    a. 01/2015 DES to LAD  b. 12/2015: [place] 85% oLCx lesion--> rx therapy.  . Chronic diastolic CHF (congestive heart failure) ([place])    a. 05/2017 Echo: EF 60-65%, Gr1 DD, no rwma, mildly dil LA, nl RV fxn, mild TR.  . CKD (chronic kidney disease), stage II    a. probable CKD II-III with baseline CR 1.1-1.3.  . DM type 2 (diabetes mellitus, type 2), insulin dependent 01/29/2014   fasting cbg 50-120 with new regimen  . Dyslipidemia, goal LDL below 70 01/29/2014  . Enlarged prostate   . Essential tremor    a. on proprnolol  . GERD (gastroesophageal reflux disease)   . Hypertension   . Internal hemorrhoid   . Sleep apnea    does not use cpap (06/04/2015)  . TIA (transient ischemic attack) 2002    Past Surgical History:  Procedure Laterality Date  . BACK SURGERY    . CARDIAC CATHETERIZATION  01/28/14   + CAD treat medically  . CARDIAC CATHETERIZATION N/A 12/21/2015   Procedure: Right/Left Heart Cath and Coronary Angiography;  Surgeon: Belva Crome, MD;  Location: [facility]  INVASIVE CV LAB;  Service: Cardiovascular;  Laterality: N/A;  . CARDIAC CATHETERIZATION N/A 11/02/2016   Procedure: Left Heart Cath and Coronary Angiography;  Surgeon: Nelva Bush, MD;  Location: [place] CV LAB;  Service: Cardiovascular;  Laterality: N/A;  . CATARACT EXTRACTION Bilateral   . CATARACT EXTRACTION, BILATERAL Bilateral   . CORONARY ANGIOPLASTY WITH STENT PLACEMENT  01/30/2015   DES [place] to LAD by Dr Tamala Julian  . CORONARY ARTERY BYPASS GRAFT N/A 11/09/2016   Procedure: CORONARY ARTERY BYPASS GRAFTING (CABG) x 2;  Surgeon: Melrose Nakayama, MD;  Location: [place];  Service: Open Heart Surgery;  Laterality: N/A;  . ENDOVEIN HARVEST OF GREATER SAPHENOUS VEIN Left 11/09/2016   Procedure: ENDOVEIN  HARVEST OF GREATER SAPHENOUS VEIN;  Surgeon: Melrose Nakayama, MD;  Location: [place];  Service: Open Heart Surgery;  Laterality: Left;  . JOINT REPLACEMENT    . LEFT HEART CATH AND CORS/GRAFTS ANGIOGRAPHY N/A 03/09/2018   Procedure: LEFT HEART CATH AND CORS/GRAFTS ANGIOGRAPHY;  Surgeon: Belva Crome, MD;  Location: [place] CV LAB;  Service: Cardiovascular;  Laterality: N/A;  . LEFT HEART CATHETERIZATION WITH CORONARY ANGIOGRAM N/A 01/28/2014   Procedure: LEFT HEART CATHETERIZATION WITH CORONARY ANGIOGRAM;  Surgeon: Sinclair Grooms, MD;  Location: [facility] CATH LAB;  Service: Cardiovascular;  Laterality: N/A;  . LEFT HEART CATHETERIZATION WITH CORONARY ANGIOGRAM N/A 01/30/2015   Procedure: LEFT HEART CATHETERIZATION WITH CORONARY ANGIOGRAM;  Surgeon: Belva Crome, MD;  Location: [facility] CATH LAB;  Service: Cardiovascular;  Laterality: N/A;  . LUMBAR [place]    . SHOULDER ARTHROSCOPY Right 07/30/2014   Procedure: Right Shoulder Arthroscopy, Debridement, Decompression, Manipulation Under Anesthesia;  Surgeon: Newt Minion, MD;  Location: [place];  Service: Orthopedics;  Laterality: Right;  . TEE WITHOUT CARDIOVERSION N/A 11/09/2016   Procedure: TRANSESOPHAGEAL ECHOCARDIOGRAM (TEE);  Surgeon: Melrose Nakayama, MD;  Location: [place];  Service: Open Heart Surgery;  Laterality: N/A;  . TOTAL KNEE ARTHROPLASTY Bilateral      Home Medications:  Prior to Admission medications   Medication Sig Start Date End Date Taking? Authorizing Provider  acetaminophen (TYLENOL) 325 MG tablet Take 650 mg by mouth every 6 (six) hours as needed (for neck pain).   Yes [provider]  albuterol (VENTOLIN HFA) 108 (90 Base) MCG/ACT inhaler inhale 2 puffs by mouth every 6 hours if needed for wheezing or shortness of breath 07/24/17  Yes Lowne Lyndal Pulley R, DO  amLODipine (NORVASC) 10 MG tablet Take 1 tablet (10 mg total) by mouth daily. 03/13/18  Yes Ann Held, DO  aspirin EC 81 MG tablet Take 81 mg  by mouth daily.   Yes [provider]  bismuth subsalicylate (PEPTO BISMOL) 262 MG/15ML suspension Take 30 mLs by mouth as needed for indigestion.   Yes [provider]  Ciclopirox 1 % shampoo Apply 1 application topically See admin instructions. Massage in scalp three times weekly and leave on for 10 min. Then rinse.   Yes [provider]  clonazePAM (KLONOPIN) 1 MG tablet Take 1 tablet (1 mg total) by mouth at bedtime. Patient taking differently: Take 1 mg by mouth at bedtime as needed (for sleep or anxiety).  03/13/18  Yes Ann Held, DO  clopidogrel (PLAVIX) 75 MG tablet Take 1 tablet (75 mg total) by mouth daily. 03/13/18  Yes Ann Held, DO  cyclobenzaprine (FLEXERIL) 5 MG tablet Take 1 tablet (5 mg total) by mouth at bedtime  as needed for muscle spasms (headache). Patient taking differently: Take 5 mg by mouth at bedtime as needed (for muscle spasms, leg cramps, or headaches).  04/23/18  Yes Patel, Donika K, DO  dicyclomine (BENTYL) 10 MG capsule Take 10 mg by mouth 2 (two) times daily.  01/01/16  Yes [provider]  ENSURE (ENSURE) Take 1-2 Cans by mouth daily.   Yes [provider]  esomeprazole (NEXIUM) 40 MG capsule Take 40 mg by mouth daily as needed (for reflux).  01/01/16  Yes [provider]  fenofibrate 160 MG tablet Take 160 mg by mouth daily.  01/01/16  Yes [provider]  furosemide (LASIX) 40 MG tablet Take 1.5 tablets (60 mg total) by mouth daily. 03/29/18 07/10/18 Yes Duke, Tami Lin, PA  gabapentin (NEURONTIN) 300 MG capsule take 1 capsule by mouth at bedtime for 1 week then INCREASE TO 1 CAP TWICE DAILY Patient taking differently: Take 300 mg by mouth 2 (two) times daily.  03/13/18  Yes Roma Schanz R, DO  hydrALAZINE (APRESOLINE) 25 MG tablet Take 1 tablet (25 mg total) by mouth 3 (three) times daily. 03/28/18 07/10/18 Yes Duke, Tami Lin, PA  HYDROcodone-acetaminophen (NORCO/VICODIN)  5-325 MG tablet Take 1 tablet by mouth every 6 (six) hours as needed (for pain).    Yes [provider]  insulin aspart (NOVOLOG) 100 UNIT/ML injection Inject 40 Units into the skin 3 (three) times daily before meals. 06/25/18  Yes Philemon Kingdom, MD  insulin NPH Human (NOVOLIN N RELION) 100 UNIT/ML injection Inject 30 Units into the skin 2 (two) times daily before a meal.    Yes [provider]  meclizine (ANTIVERT) 25 MG tablet Take 1 tablet (25 mg total) by mouth 2 (two) times daily as needed for dizziness. 06/10/17  Yes Theora Gianotti, NP  nitroGLYCERIN (NITROSTAT) 0.4 MG SL tablet Place 1 tablet (0.4 mg total) under the tongue every 5 (five) minutes as needed for chest pain. Max: 3 doses per episode. Patient taking differently: Place 0.4-0.8 mg under the tongue every 5 (five) minutes x 3 doses as needed for chest pain.  11/07/17  Yes Burtis Junes, NP  Polyvinyl Alcohol-Povidone (REFRESH OP) Place 1 drop into both eyes 3 (three) times daily as needed (for dry eyes). Reported on 12/25/2015   Yes [provider]  potassium chloride SA (K-DUR,KLOR-CON) 20 MEQ tablet TAKE 1 & 1/2 (ONE & ONE-HALF) TABLETS BY MOUTH ONCE DAILY Patient taking differently: Take 30 mEq by mouth daily.  04/27/18  Yes Duke, Tami Lin, PA  prednisoLONE acetate (PRED FORTE) 1 % ophthalmic suspension Place 2 drops into both eyes 2 (two) times daily as needed for itching.   Yes [provider]  primidone (MYSOLINE) 50 MG tablet Take 1 tablet (50 mg total) by mouth 2 (two) times daily. 04/23/18  Yes Patel, Donika K, DO  propranolol (INDERAL) 40 MG tablet Take 1 tablet (40 mg total) by mouth 3 (three) times daily. 03/13/18  Yes Roma Schanz R, DO  psyllium (METAMUCIL) 58.6 % packet Take 1 packet by mouth daily as needed (constipation).   Yes [provider]  rosuvastatin (CRESTOR) 20 MG tablet Take 1 tablet (20 mg total) by mouth daily. Patient taking differently:  Take 20 mg by mouth 2 (two) times a week.  03/13/18 03/08/19 Yes Roma Schanz R, DO  tamsulosin (FLOMAX) 0.4 MG CAPS capsule Take 0.4 mg by mouth daily.   Yes [provider]  traMADol Veatrice Bourbon) 50  MG tablet Take 50 mg by mouth 2 (two) times daily.   Yes [provider]  diclofenac sodium (VOLTAREN) 1 % GEL Apply 2 g topically 4 (four) times daily as needed (for pain).     [provider]  erythromycin ophthalmic ointment Place 1 application into both eyes See admin instructions. Apply to both eyelids once a day as needed for itching    [provider]  Insulin Syringe-Needle U-100 (BD INSULIN SYRINGE U/F) 31G X 5/16" 1 ML MISC Use 4 times per day to inject insulin. 01/16/18   Philemon Kingdom, MD    Inpatient Medications: Scheduled Meds: . amLODipine  10 mg Oral Daily  . dicyclomine  10 mg Oral BID  . gabapentin  300 mg Oral BID  . hydrALAZINE  25 mg Oral TID  . insulin aspart  0-9 Units Subcutaneous Q4H  . insulin detemir  40 Units Subcutaneous BID  . pantoprazole  40 mg Oral Daily  . primidone  50 mg Oral BID  . propranolol  40 mg Oral TID  . [START ON 07/12/2018] rosuvastatin  20 mg Oral Once per day on Mon Thu  . sodium chloride flush  3 mL Intravenous Q12H  . tamsulosin  0.4 mg Oral Daily   Continuous Infusions: . sodium chloride     PRN Meds: sodium chloride, acetaminophen **OR** acetaminophen, albuterol, bisacodyl, clonazePAM, cyclobenzaprine, morphine injection, ondansetron **OR** ondansetron (ZOFRAN) IV, polyvinyl alcohol, senna-docusate, sodium chloride flush  Allergies:    Allergies  Allergen Reactions  . Pneumococcal Vaccines Anaphylaxis    Social History:   Social History   Socioeconomic History  . Marital status: Married    Spouse name: Not on file  . Number of children: 0  . Years of education: Not on file  . Highest education level: Not on file  Occupational History  . Occupation: retired  Scientific laboratory technician  . Financial  resource strain: Not on file  . Food insecurity:    Worry: Not on file    Inability: Not on file  . Transportation needs:    Medical: Not on file    Non-medical: Not on file  Tobacco Use  . Smoking status: Never Smoker  . Smokeless tobacco: Never Used  Substance and Sexual Activity  . Alcohol use: No    Alcohol/week: 0.0 standard drinks  . Drug use: No  . Sexual activity: Not Currently  Lifestyle  . Physical activity:    Days per week: Not on file    Minutes per session: Not on file  . Stress: Not on file  Relationships  . Social connections:    Talks on phone: Not on file    Gets together: Not on file    Attends religious service: Not on file    Active member of club or organization: Not on file    Attends meetings of clubs or organizations: Not on file    Relationship status: Not on file  . Intimate partner violence:    Fear of current or ex partner: Not on file    Emotionally abused: Not on file    Physically abused: Not on file    Forced sexual activity: Not on file  Other Topics Concern  . Not on file  Social History Narrative   He is a Geophysicist/field seismologist by trade.   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   He lives alone.  His wife is living in [place] at this time.  They do not have  children.   Highest level of education:  College graduate    Family History:    Family History  Problem Relation Age of Onset  . CAD Brother        2 brothers - CABG  . Alzheimer's disease Sister   . CAD Sister   . Prostate cancer Brother   . Asthma Brother        2 brothers   . Diabetes Other        entire family  . Colon cancer Neg Hx      ROS:  Please see the history of present illness.  General:no colds or fevers, no weight changes Skin:no rashes or ulcers HEENT:no blurred vision, no congestion CV:see HPI PUL:see HPI GI:no diarrhea constipation or melena, no indigestion GU:no hematuria, no dysuria MS:no joint pain, no claudication, Neck pain  and Rt arm numbness Neuro:no syncope, no lightheadedness Endo:+ diabetes, no thyroid disease  All other ROS reviewed and negative.     Physical Exam/Data:   Vitals:   07/10/18 0345 07/10/18 0415 07/10/18 0552 07/10/18 0906  BP: 139/64 134/60 (!) 154/79 (!) 187/104  Pulse: 72 70 70 77  Resp: 16 14 18 18   Temp:   97.7 F (36.5 C) 97.8 F (36.6 C)  TempSrc:   Oral Oral  SpO2: 97% 98% 100% 100%    Intake/Output Summary (Last 24 hours) at 07/10/2018 1420 Last data filed at 07/10/2018 0107 Gross per 24 hour  Intake 0 ml  Output 200 ml  Net -200 ml   There were no vitals filed for this visit. There is no height or weight on file to calculate BMI.  General:  Well nourished, well developed, in no acute distress HEENT: normal Lymph: no adenopathy Neck: no JVD Endocrine:  No thryomegaly Vascular: No carotid bruits; pedal pulses 1+ bilaterally  Cardiac:  normal S1, S2; RRR; no murmur, gallup rub or click  Lungs:  clear to auscultation bilaterally, no wheezing, rhonchi or rales  Abd: soft, nontender, no hepatomegaly  Ext: no edema Musculoskeletal:  No deformities,  BLE strength normal and equal Skin: warm and dry  Neuro:  Alert and oriented X 3 MAE, follows commands,  no focal abnormalities noted, pain Lt neck Psych:  Normal affect     Relevant CV Studies: Cardiac cath 03/09/18  Difficult procedure from left radial approach due to difficulty with torque control.  Would advise femoral approach in the future.  Eccentric 60% proximal LAD.  Widely patent mid LAD stent.  Antegrade flow to the apex.  Widely patent left main.  Totally occluded ostial circumflex to proximal circumflex.  Proximal mid and distal RCA moderate disease with tandem 65 to 75% % distal segmental stenoses.  Patent saphenous vein graft to the second obtuse marginal which supplies the entire circumflex territory.  Widely patent LIMA to the distal segment of the LAD.  Competitive flow is noted.  Low normal  LV systolic function with EF 50 to 55%.  Mildly elevated end-diastolic pressure.  Continuous sharp left chest pain not felt to be ischemia related.  RECOMMENDATIONS:   Consider musculoskeletal or neuropathic pain as a source of the patient's presentation.  Continue aggressive risk factor modification for secondary prevention of ischemic events.  If No Local complications, could be discharged later today.   Echo 06/08/17 Study Conclusions  - Left ventricle: The cavity size was normal. There was severe   concentric hypertrophy. Systolic function was normal. The   estimated ejection fraction was in the range of 60%  to 65%. Wall   motion was normal; there were no regional wall motion   abnormalities. Doppler parameters are consistent with abnormal   left ventricular relaxation (grade 1 diastolic dysfunction).   There was no evidence of elevated ventricular filling pressure by   Doppler parameters. - Aortic valve: Trileaflet; normal thickness leaflets. There was no   regurgitation. - Aortic root: The aortic root was normal in size. - Mitral valve: There was no regurgitation. - Left atrium: The atrium was mildly dilated. - Right ventricle: Systolic function was normal. - Tricuspid valve: There was mild regurgitation. - Pulmonary arteries: Systolic pressure was within the normal   range. - Inferior vena cava: The vessel was not visualized. - Pericardium, extracardiac: There was no pericardial effusion.  Laboratory Data:  Chemistry Recent Labs  Lab 07/09/18 1027 07/10/18 0430  NA 140 136  K 3.9 4.2  CL 105 102  CO2 29 27  GLUCOSE 182* 335*  BUN 14 10  CREATININE 1.11 1.06  CALCIUM 8.6* 8.2*  GFRNONAA >60 >60  GFRAA >60 >60  ANIONGAP 6 7    No results for input(s): PROT, ALBUMIN, AST, ALT, ALKPHOS, BILITOT in the last 168 hours. Hematology Recent Labs  Lab 07/09/18 1027  WBC 6.5  RBC 4.26  HGB 12.2*  HCT 38.3*  MCV 89.9  MCH 28.6  MCHC 31.9  RDW 13.5  PLT  229   Cardiac EnzymesNo results for input(s): TROPONINI in the last 168 hours. No results for input(s): TROPIPOC in the last 168 hours.  BNPNo results for input(s): BNP, PROBNP in the last 168 hours.  DDimer No results for input(s): DDIMER in the last 168 hours.  Radiology/Studies:  Ct Angio Head W Or Wo Contrast  Result Date: 07/09/2018 CLINICAL DATA:  Initial evaluation for neck pain with extremity weakness. EXAM: CT ANGIOGRAPHY HEAD AND NECK TECHNIQUE: Multidetector CT imaging of the head and neck was performed using the standard protocol during bolus administration of intravenous contrast. Multiplanar CT image reconstructions and MIPs were obtained to evaluate the vascular anatomy. Carotid stenosis measurements (when applicable) are obtained utilizing NASCET criteria, using the distal internal carotid diameter as the denominator. CONTRAST:  <See Chart> ISOVUE-370 IOPAMIDOL (ISOVUE-370) INJECTION 76% COMPARISON:  Prior CT from 05/22/2017. FINDINGS: CT HEAD FINDINGS Brain: Age-related cerebral atrophy with mild chronic small vessel ischemic disease. No acute intracranial hemorrhage. No acute large vessel territory infarct. No mass lesion, midline shift or mass effect. No hydrocephalus. No extra-axial fluid collection. Vascular: No hyperdense vessel. Skull: Scalp soft tissues and calvarium within normal limits. Sinuses: Paranasal sinuses are clear. Trace opacity right mastoid air cells noted, of doubtful significance. Orbits: Globes and orbital soft tissues within normal limits. Review of the MIP images confirms the above findings CTA NECK FINDINGS Aortic arch: Visualized aortic arch of normal caliber with normal 3 vessel morphology. Sequelae of prior CABG partially visualized. Mild atheromatous plaque within the arch and about the origin of the great vessels without hemodynamically significant stenosis. Visualized subclavian arteries widely patent. Right carotid system: Right common carotid artery patent  from its origin to the bifurcation without flow-limiting stenosis. Mild atheromatous irregularity about the right bifurcation with associated stenosis of up to 40% by NASCET criteria. Right ICA widely patent distally to the skull base without stenosis, dissection, or occlusion. Left carotid system: Soft atheromatous plaque within the right common carotid artery with secondary mild diffuse stenosis. Minimal calcified plaque about the left bifurcation without significant narrowing. Left ICA widely patent from the bifurcation to the  skull base without stenosis, dissection, or occlusion. Vertebral arteries: Both of the vertebral arteries arise from the subclavian arteries. Focal plaque at the origin of the vertebral arteries bilaterally with associated moderate approximate 50% stenosis on the left and fairly severe stenosis on the right. Left vertebral artery dominant. Vertebral arteries otherwise widely patent within the neck without stenosis, dissection, or occlusion. Skeleton: No acute osseous abnormality. No discrete lytic or blastic osseous lesions. Other neck: No acute soft tissue abnormality within the neck. Few scattered subcentimeter hypodense thyroid nodules noted, of doubtful significance given size and patient age. Upper chest: Visualized upper chest demonstrates no acute finding. Partially visualized lungs are grossly clear. Review of the MIP images confirms the above findings CTA HEAD FINDINGS Anterior circulation: Petrous segments widely patent bilaterally. Mild atheromatous plaque within the cavernous/supraclinoid ICAs without hemodynamically significant stenosis. ICA termini widely patent. A1 segments, anterior communicating artery common anterior cerebral arteries widely patent bilaterally. M1 segments widely patent without stenosis. No proximal M2 occlusion. Distal MCA branches well perfused and symmetric. Distal small vessel atheromatous irregularity noted. Posterior circulation: Mild atheromatous  irregularity within the dominant left vertebral artery without hemodynamically significant stenosis. Focal moderate distal right V4 segment just beyond the takeoff of the right PICA. Posterior inferior cerebral arteries patent bilaterally. Basilar artery diminutive but widely patent to its distal aspect. Superior cerebral arteries patent bilaterally. Hypoplastic P1 segments with predominant fetal type origin of the PCAs. Moderate to advanced multifocal atheromatous irregularity throughout the PCAs bilaterally with associated moderate to severe multifocal stenoses. Venous sinuses: Patent. Anatomic variants: Fetal type origin of the PCAs.  No aneurysm. Delayed phase: No abnormal enhancement. Review of the MIP images confirms the above findings IMPRESSION: 1. Negative CTA for large vessel occlusion. No other acute vascular abnormality within the head and neck. 2. Moderate intracranial atherosclerotic change, most notable within the bilateral PCAs and distal MCA branches. No other proximal high-grade or correctable stenosis identified. 3. 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 Left vertebral artery dominant. Predominant fetal type origin of the PCAs with overall diminutive vertebrobasilar system. 4. Atheromatous plaque about the right carotid bifurcation with associated stenosis of up to approximately 40% by NASCET criteria. Electronically Signed   By: Jeannine Boga M.D.   On: 07/09/2018 21:44   Ct Angio Neck W And/or Wo Contrast  Result Date: 07/09/2018 CLINICAL DATA:  Initial evaluation for neck pain with extremity weakness. EXAM: CT ANGIOGRAPHY HEAD AND NECK TECHNIQUE: Multidetector CT imaging of the head and neck was performed using the standard protocol during bolus administration of intravenous contrast. Multiplanar CT image reconstructions and MIPs were obtained to evaluate the vascular anatomy. Carotid stenosis  measurements (when applicable) are obtained utilizing NASCET criteria, using the distal internal carotid diameter as the denominator. CONTRAST:  <See Chart> ISOVUE-370 IOPAMIDOL (ISOVUE-370) INJECTION 76% COMPARISON:  Prior CT from 05/22/2017. FINDINGS: CT HEAD FINDINGS Brain: Age-related cerebral atrophy with mild chronic small vessel ischemic disease. No acute intracranial hemorrhage. No acute large vessel territory infarct. No mass lesion, midline shift or mass effect. No hydrocephalus. No extra-axial fluid collection. Vascular: No hyperdense vessel. Skull: Scalp soft tissues and calvarium within normal limits. Sinuses: Paranasal sinuses are clear. Trace opacity right mastoid air cells noted, of doubtful significance. Orbits: Globes and orbital soft tissues within normal limits. Review of the MIP images confirms the above findings CTA NECK FINDINGS Aortic arch: Visualized aortic arch of normal caliber with normal 3 vessel morphology.  Sequelae of prior CABG partially visualized. Mild atheromatous plaque within the arch and about the origin of the great vessels without hemodynamically significant stenosis. Visualized subclavian arteries widely patent. Right carotid system: Right common carotid artery patent from its origin to the bifurcation without flow-limiting stenosis. Mild atheromatous irregularity about the right bifurcation with associated stenosis of up to 40% by NASCET criteria. Right ICA widely patent distally to the skull base without stenosis, dissection, or occlusion. Left carotid system: Soft atheromatous plaque within the right common carotid artery with secondary mild diffuse stenosis. Minimal calcified plaque about the left bifurcation without significant narrowing. Left ICA widely patent from the bifurcation to the skull base without stenosis, dissection, or occlusion. Vertebral arteries: Both of the vertebral arteries arise from the subclavian arteries. Focal plaque at the origin of the vertebral  arteries bilaterally with associated moderate approximate 50% stenosis on the left and fairly severe stenosis on the right. Left vertebral artery dominant. Vertebral arteries otherwise widely patent within the neck without stenosis, dissection, or occlusion. Skeleton: No acute osseous abnormality. No discrete lytic or blastic osseous lesions. Other neck: No acute soft tissue abnormality within the neck. Few scattered subcentimeter hypodense thyroid nodules noted, of doubtful significance given size and patient age. Upper chest: Visualized upper chest demonstrates no acute finding. Partially visualized lungs are grossly clear. Review of the MIP images confirms the above findings CTA HEAD FINDINGS Anterior circulation: Petrous segments widely patent bilaterally. Mild atheromatous plaque within the cavernous/supraclinoid ICAs without hemodynamically significant stenosis. ICA termini widely patent. A1 segments, anterior communicating artery common anterior cerebral arteries widely patent bilaterally. M1 segments widely patent without stenosis. No proximal M2 occlusion. Distal MCA branches well perfused and symmetric. Distal small vessel atheromatous irregularity noted. Posterior circulation: Mild atheromatous irregularity within the dominant left vertebral artery without hemodynamically significant stenosis. Focal moderate distal right V4 segment just beyond the takeoff of the right PICA. Posterior inferior cerebral arteries patent bilaterally. Basilar artery diminutive but widely patent to its distal aspect. Superior cerebral arteries patent bilaterally. Hypoplastic P1 segments with predominant fetal type origin of the PCAs. Moderate to advanced multifocal atheromatous irregularity throughout the PCAs bilaterally with associated moderate to severe multifocal stenoses. Venous sinuses: Patent. Anatomic variants: Fetal type origin of the PCAs.  No aneurysm. Delayed phase: No abnormal enhancement. Review of the MIP images  confirms the above findings IMPRESSION: 1. Negative CTA for large vessel occlusion. No other acute vascular abnormality within the head and neck. 2. Moderate intracranial atherosclerotic change, most notable within the bilateral PCAs and distal MCA branches. No other proximal high-grade or correctable stenosis identified. 3. 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 Left vertebral artery dominant. Predominant fetal type origin of the PCAs with overall diminutive vertebrobasilar system. 4. Atheromatous plaque about the right carotid bifurcation with associated stenosis of up to approximately 40% by NASCET criteria. Electronically Signed   By: Jeannine Boga M.D.   On: 07/09/2018 21:44   Mr Brain W And Wo Contrast  Result Date: 07/10/2018 CLINICAL DATA:  77 y/o M; chief complaint of neck pain with bilateral upper and lower extremity weakness. EXAM: MRI HEAD WITHOUT AND WITH CONTRAST MRI CERVICAL SPINE WITHOUT AND WITH CONTRAST TECHNIQUE: Multiplanar, multiecho pulse sequences of the brain and surrounding structures, and cervical spine, to include the craniocervical junction and cervicothoracic junction, were obtained without and with intravenous contrast. CONTRAST:  10 cc Gadavist. COMPARISON:  07/09/2018 CTA head and neck. FINDINGS:  MRI HEAD FINDINGS Brain: No acute infarction, hemorrhage, hydrocephalus, extra-axial collection or mass lesion. Few nonspecific T2 FLAIR hyperintensities in subcortical and periventricular white matter are compatible with mild chronic microvascular ischemic changes for age. Mild volume loss of the brain. No abnormal enhancement. Vascular: Normal flow voids. Skull and upper cervical spine: Normal marrow signal. Sinuses/Orbits: Mild maxillary sinus mucosal thickening and partial opacification of mastoid air cells. Bilateral intra-ocular lens replacement. Other: None. MRI CERVICAL SPINE FINDINGS  Alignment: Physiologic. Vertebrae: Mild right-sided C5-6 facet edema and small facet effusions at the left-sided C3-4 and C4-5 levels, likely degenerative. No findings of fracture or discitis. Cord: No abnormal cord signal.  No abnormal enhancement. Posterior Fossa, vertebral arteries, paraspinal tissues: Negative. Disc levels: C2-3: Right greater than left uncovertebral and facet hypertrophy. Mild right foraminal stenosis. No canal stenosis. C3-4: Disc osteophyte complex with bilateral uncovertebral and facet hypertrophy. Moderate bilateral foraminal and moderate canal stenosis. C4-5: Disc osteophyte complex eccentric with bilateral uncovertebral and facet hypertrophy. Severe bilateral foraminal stenosis. Moderate canal stenosis and right anterior cord impingement cord flattening. C5-6: Disc osteophyte complex with bilateral uncovertebral and facet hypertrophy. Severe bilateral foraminal stenosis and moderate canal stenosis. C6-7: Disc osteophyte complex with left central disc protrusion. Moderate canal stenosis with left central anterior cord impingement. No significant foraminal stenosis. C7-T1: Small disc bulge and facet hypertrophy. Mild canal stenosis. No significant foraminal stenosis. IMPRESSION: MRI head: 1. No acute intracranial process or abnormal enhancement. 2. Mild chronic microvascular ischemic changes and volume loss of the brain. MRI cervical spine: 1. Mild right C5-6 as well as left C3-4 and C4-5 facet effusions, likely degenerative. 2. Advanced cervical spondylosis with disc and facet degenerative changes greatest at the C3-4 through C6-7 levels. 3. Moderate C3-4 through C6-7 canal stenosis. Right anterior C4-5 and left anterior C6-7 cord impingement. No abnormal cord signal. 4. Severe C4-5 and C5-6 bilateral foraminal stenosis. Multilevel mild and moderate foraminal stenosis. Electronically Signed   By: Kristine Garbe M.D.   On: 07/10/2018 00:56   Mr Cervical Spine W Or Wo  Contrast  Result Date: 07/10/2018 CLINICAL DATA:  77 y/o M; chief complaint of neck pain with bilateral upper and lower extremity weakness. EXAM: MRI HEAD WITHOUT AND WITH CONTRAST MRI CERVICAL SPINE WITHOUT AND WITH CONTRAST TECHNIQUE: Multiplanar, multiecho pulse sequences of the brain and surrounding structures, and cervical spine, to include the craniocervical junction and cervicothoracic junction, were obtained without and with intravenous contrast. CONTRAST:  10 cc Gadavist. COMPARISON:  07/09/2018 CTA head and neck. FINDINGS: MRI HEAD FINDINGS Brain: No acute infarction, hemorrhage, hydrocephalus, extra-axial collection or mass lesion. Few nonspecific T2 FLAIR hyperintensities in subcortical and periventricular white matter are compatible with mild chronic microvascular ischemic changes for age. Mild volume loss of the brain. No abnormal enhancement. Vascular: Normal flow voids. Skull and upper cervical spine: Normal marrow signal. Sinuses/Orbits: Mild maxillary sinus mucosal thickening and partial opacification of mastoid air cells. Bilateral intra-ocular lens replacement. Other: None. MRI CERVICAL SPINE FINDINGS Alignment: Physiologic. Vertebrae: Mild right-sided C5-6 facet edema and small facet effusions at the left-sided C3-4 and C4-5 levels, likely degenerative. No findings of fracture or discitis. Cord: No abnormal cord signal.  No abnormal enhancement. Posterior Fossa, vertebral arteries, paraspinal tissues: Negative. Disc levels: C2-3: Right greater than left uncovertebral and facet hypertrophy. Mild right foraminal stenosis. No canal stenosis. C3-4: Disc osteophyte complex with bilateral uncovertebral and facet hypertrophy. Moderate bilateral foraminal and moderate canal stenosis. C4-5: Disc osteophyte complex eccentric with bilateral uncovertebral and facet hypertrophy. Severe  bilateral foraminal stenosis. Moderate canal stenosis and right anterior cord impingement cord flattening. C5-6: Disc  osteophyte complex with bilateral uncovertebral and facet hypertrophy. Severe bilateral foraminal stenosis and moderate canal stenosis. C6-7: Disc osteophyte complex with left central disc protrusion. Moderate canal stenosis with left central anterior cord impingement. No significant foraminal stenosis. C7-T1: Small disc bulge and facet hypertrophy. Mild canal stenosis. No significant foraminal stenosis. IMPRESSION: MRI head: 1. No acute intracranial process or abnormal enhancement. 2. Mild chronic microvascular ischemic changes and volume loss of the brain. MRI cervical spine: 1. Mild right C5-6 as well as left C3-4 and C4-5 facet effusions, likely degenerative. 2. Advanced cervical spondylosis with disc and facet degenerative changes greatest at the C3-4 through C6-7 levels. 3. Moderate C3-4 through C6-7 canal stenosis. Right anterior C4-5 and left anterior C6-7 cord impingement. No abnormal cord signal. 4. Severe C4-5 and C5-6 bilateral foraminal stenosis. Multilevel mild and moderate foraminal stenosis. Electronically Signed   By: Kristine Garbe M.D.   On: 07/10/2018 00:56    Assessment and Plan:   1. Pre-op exam for possible surgery of neck  Cervical spondylosis with cord compression, radiculopathy.  Pt is acceptable risk for surgery - he is active and no chest pain.  His CABG was done 10/2016   X 2.  Non obstructive disease in RCA currently. Grafts patent on cath 02/2018   2.   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   Fluttering, no a fib on monitor in May/June now SR on EKGs  4.   IDDM on insulin per IM  5.   HTN elevated now , will increase the hydralazine.  Is off lasix monitor for edema, dyspnea.  6.   OSA per IM  7.  HLD on crestor but only twice weekly due to mood swings.      For questions or updates, please contact [place] Please consult [email or web address] for contact info under      Signed, Cecilie Kicks, NP  07/10/2018 2:20 PM  I have examined the patient and reviewed assessment and plan and discussed with patient.  Agree with above as stated.  Patient with neck surgery planned.  He had a recent cath and medical therapy was recommended.  No anginal sx when walking up stairs at home.  No further cardiac testing needed before surgery.  COntinue aggressive medical therapy.  Restart clopidogrel when safe from a bleeding standpoint after surgery.  Larae Grooms

## 2018-07-10 NOTE — ED Provider Notes (Signed)
I assumed care of patient from Margarita Mail, PA-C, please see her note for full H&P.  Briefly patient has been seen by her and Dr. Tyrone Nine.  He is here for right-sided weakness with left-sided neck pain.  Plan is to follow-up on CTA of head and neck.   Physical Exam  BP 128/66   Pulse 74   Temp 98.4 F (36.9 C)   Resp 15   SpO2 98%   Physical Exam  Constitutional: He appears well-developed. No distress.  HENT:  Head: Normocephalic and atraumatic.  Pulmonary/Chest: Effort normal. No respiratory distress.  Abdominal: Soft. He exhibits no distension. There is no tenderness.  Musculoskeletal:  5/5 strength on left upper and lower extremities, 4/5 strength on right upper and lower extremities.   Neurological: He is alert.  Skin: Skin is warm and dry. He is not diaphoretic.  Nursing note and vitals reviewed.   ED Course/Procedures   Clinical Course as of Jul 10 140  Mon Jul 09, 2018  2215 Spoke with Dr. Cheral Marker from neurology, will obtain MRI with and without of head and C-spine.  If patient is still having his weakness when these are obtained and they are negative then that effectively rules out stroke, and TIA.  If his weakness resolves before these are completed, then he really require admission for TIA work-up.   [EH]  Tue Jul 10, 2018  0128 Discussed with Dr. Zada Finders from neurosurgery who recommended medicine admit.    [EH]  0139 Spoke with Dr. Myna Hidalgo who will come see patient.    [EH]    Clinical Course User Index [EH] Lorin Glass, PA-C    Procedures  Ct Angio Head W Or Wo Contrast  Result Date: 07/09/2018 CLINICAL DATA:  Initial evaluation for neck pain with extremity weakness. EXAM: CT ANGIOGRAPHY HEAD AND NECK TECHNIQUE: Multidetector CT imaging of the head and neck was performed using the standard protocol during bolus administration of intravenous contrast. Multiplanar CT image reconstructions and MIPs were obtained to evaluate the vascular anatomy. Carotid  stenosis measurements (when applicable) are obtained utilizing NASCET criteria, using the distal internal carotid diameter as the denominator. CONTRAST:  <See Chart> ISOVUE-370 IOPAMIDOL (ISOVUE-370) INJECTION 76% COMPARISON:  Prior CT from 05/22/2017. FINDINGS: CT HEAD FINDINGS Brain: Age-related cerebral atrophy with mild chronic small vessel ischemic disease. No acute intracranial hemorrhage. No acute large vessel territory infarct. No mass lesion, midline shift or mass effect. No hydrocephalus. No extra-axial fluid collection. Vascular: No hyperdense vessel. Skull: Scalp soft tissues and calvarium within normal limits. Sinuses: Paranasal sinuses are clear. Trace opacity right mastoid air cells noted, of doubtful significance. Orbits: Globes and orbital soft tissues within normal limits. Review of the MIP images confirms the above findings CTA NECK FINDINGS Aortic arch: Visualized aortic arch of normal caliber with normal 3 vessel morphology. Sequelae of prior CABG partially visualized. Mild atheromatous plaque within the arch and about the origin of the great vessels without hemodynamically significant stenosis. Visualized subclavian arteries widely patent. Right carotid system: Right common carotid artery patent from its origin to the bifurcation without flow-limiting stenosis. Mild atheromatous irregularity about the right bifurcation with associated stenosis of up to 40% by NASCET criteria. Right ICA widely patent distally to the skull base without stenosis, dissection, or occlusion. Left carotid system: Soft atheromatous plaque within the right common carotid artery with secondary mild diffuse stenosis. Minimal calcified plaque about the left bifurcation without significant narrowing. Left ICA widely patent from the bifurcation to the skull base without  stenosis, dissection, or occlusion. Vertebral arteries: Both of the vertebral arteries arise from the subclavian arteries. Focal plaque at the origin of the  vertebral arteries bilaterally with associated moderate approximate 50% stenosis on the left and fairly severe stenosis on the right. Left vertebral artery dominant. Vertebral arteries otherwise widely patent within the neck without stenosis, dissection, or occlusion. Skeleton: No acute osseous abnormality. No discrete lytic or blastic osseous lesions. Other neck: No acute soft tissue abnormality within the neck. Few scattered subcentimeter hypodense thyroid nodules noted, of doubtful significance given size and patient age. Upper chest: Visualized upper chest demonstrates no acute finding. Partially visualized lungs are grossly clear. Review of the MIP images confirms the above findings CTA HEAD FINDINGS Anterior circulation: Petrous segments widely patent bilaterally. Mild atheromatous plaque within the cavernous/supraclinoid ICAs without hemodynamically significant stenosis. ICA termini widely patent. A1 segments, anterior communicating artery common anterior cerebral arteries widely patent bilaterally. M1 segments widely patent without stenosis. No proximal M2 occlusion. Distal MCA branches well perfused and symmetric. Distal small vessel atheromatous irregularity noted. Posterior circulation: Mild atheromatous irregularity within the dominant left vertebral artery without hemodynamically significant stenosis. Focal moderate distal right V4 segment just beyond the takeoff of the right PICA. Posterior inferior cerebral arteries patent bilaterally. Basilar artery diminutive but widely patent to its distal aspect. Superior cerebral arteries patent bilaterally. Hypoplastic P1 segments with predominant fetal type origin of the PCAs. Moderate to advanced multifocal atheromatous irregularity throughout the PCAs bilaterally with associated moderate to severe multifocal stenoses. Venous sinuses: Patent. Anatomic variants: Fetal type origin of the PCAs.  No aneurysm. Delayed phase: No abnormal enhancement. Review of the  MIP images confirms the above findings IMPRESSION: 1. Negative CTA for large vessel occlusion. No other acute vascular abnormality within the head and neck. 2. Moderate intracranial atherosclerotic change, most notable within the bilateral PCAs and distal MCA branches. No other proximal high-grade or correctable stenosis identified. 3. 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 Left vertebral artery dominant. Predominant fetal type origin of the PCAs with overall diminutive vertebrobasilar system. 4. Atheromatous plaque about the right carotid bifurcation with associated stenosis of up to approximately 40% by NASCET criteria. Electronically Signed   By: Jeannine Boga M.D.   On: 07/09/2018 21:44   Ct Angio Neck W And/or Wo Contrast  Result Date: 07/09/2018 CLINICAL DATA:  Initial evaluation for neck pain with extremity weakness. EXAM: CT ANGIOGRAPHY HEAD AND NECK TECHNIQUE: Multidetector CT imaging of the head and neck was performed using the standard protocol during bolus administration of intravenous contrast. Multiplanar CT image reconstructions and MIPs were obtained to evaluate the vascular anatomy. Carotid stenosis measurements (when applicable) are obtained utilizing NASCET criteria, using the distal internal carotid diameter as the denominator. CONTRAST:  <See Chart> ISOVUE-370 IOPAMIDOL (ISOVUE-370) INJECTION 76% COMPARISON:  Prior CT from 05/22/2017. FINDINGS: CT HEAD FINDINGS Brain: Age-related cerebral atrophy with mild chronic small vessel ischemic disease. No acute intracranial hemorrhage. No acute large vessel territory infarct. No mass lesion, midline shift or mass effect. No hydrocephalus. No extra-axial fluid collection. Vascular: No hyperdense vessel. Skull: Scalp soft tissues and calvarium within normal limits. Sinuses: Paranasal sinuses are clear. Trace opacity right mastoid air cells noted, of doubtful  significance. Orbits: Globes and orbital soft tissues within normal limits. Review of the MIP images confirms the above findings CTA NECK FINDINGS Aortic arch: Visualized aortic arch of normal caliber with normal 3 vessel morphology. Sequelae of  prior CABG partially visualized. Mild atheromatous plaque within the arch and about the origin of the great vessels without hemodynamically significant stenosis. Visualized subclavian arteries widely patent. Right carotid system: Right common carotid artery patent from its origin to the bifurcation without flow-limiting stenosis. Mild atheromatous irregularity about the right bifurcation with associated stenosis of up to 40% by NASCET criteria. Right ICA widely patent distally to the skull base without stenosis, dissection, or occlusion. Left carotid system: Soft atheromatous plaque within the right common carotid artery with secondary mild diffuse stenosis. Minimal calcified plaque about the left bifurcation without significant narrowing. Left ICA widely patent from the bifurcation to the skull base without stenosis, dissection, or occlusion. Vertebral arteries: Both of the vertebral arteries arise from the subclavian arteries. Focal plaque at the origin of the vertebral arteries bilaterally with associated moderate approximate 50% stenosis on the left and fairly severe stenosis on the right. Left vertebral artery dominant. Vertebral arteries otherwise widely patent within the neck without stenosis, dissection, or occlusion. Skeleton: No acute osseous abnormality. No discrete lytic or blastic osseous lesions. Other neck: No acute soft tissue abnormality within the neck. Few scattered subcentimeter hypodense thyroid nodules noted, of doubtful significance given size and patient age. Upper chest: Visualized upper chest demonstrates no acute finding. Partially visualized lungs are grossly clear. Review of the MIP images confirms the above findings CTA HEAD FINDINGS Anterior  circulation: Petrous segments widely patent bilaterally. Mild atheromatous plaque within the cavernous/supraclinoid ICAs without hemodynamically significant stenosis. ICA termini widely patent. A1 segments, anterior communicating artery common anterior cerebral arteries widely patent bilaterally. M1 segments widely patent without stenosis. No proximal M2 occlusion. Distal MCA branches well perfused and symmetric. Distal small vessel atheromatous irregularity noted. Posterior circulation: Mild atheromatous irregularity within the dominant left vertebral artery without hemodynamically significant stenosis. Focal moderate distal right V4 segment just beyond the takeoff of the right PICA. Posterior inferior cerebral arteries patent bilaterally. Basilar artery diminutive but widely patent to its distal aspect. Superior cerebral arteries patent bilaterally. Hypoplastic P1 segments with predominant fetal type origin of the PCAs. Moderate to advanced multifocal atheromatous irregularity throughout the PCAs bilaterally with associated moderate to severe multifocal stenoses. Venous sinuses: Patent. Anatomic variants: Fetal type origin of the PCAs.  No aneurysm. Delayed phase: No abnormal enhancement. Review of the MIP images confirms the above findings IMPRESSION: 1. Negative CTA for large vessel occlusion. No other acute vascular abnormality within the head and neck. 2. Moderate intracranial atherosclerotic change, most notable within the bilateral PCAs and distal MCA branches. No other proximal high-grade or correctable stenosis identified. 3. 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 Left vertebral artery dominant. Predominant fetal type origin of the PCAs with overall diminutive vertebrobasilar system. 4. Atheromatous plaque about the right carotid bifurcation with associated stenosis of up to approximately 40% by NASCET criteria.  Electronically Signed   By: Jeannine Boga M.D.   On: 07/09/2018 21:44   Mr Brain W And Wo Contrast  Result Date: 07/10/2018 CLINICAL DATA:  77 y/o M; chief complaint of neck pain with bilateral upper and lower extremity weakness. EXAM: MRI HEAD WITHOUT AND WITH CONTRAST MRI CERVICAL SPINE WITHOUT AND WITH CONTRAST TECHNIQUE: Multiplanar, multiecho pulse sequences of the brain and surrounding structures, and cervical spine, to include the craniocervical junction and cervicothoracic junction, were obtained without and with intravenous contrast. CONTRAST:  10 cc Gadavist. COMPARISON:  07/09/2018 CTA head and neck. FINDINGS: MRI HEAD  FINDINGS Brain: No acute infarction, hemorrhage, hydrocephalus, extra-axial collection or mass lesion. Few nonspecific T2 FLAIR hyperintensities in subcortical and periventricular white matter are compatible with mild chronic microvascular ischemic changes for age. Mild volume loss of the brain. No abnormal enhancement. Vascular: Normal flow voids. Skull and upper cervical spine: Normal marrow signal. Sinuses/Orbits: Mild maxillary sinus mucosal thickening and partial opacification of mastoid air cells. Bilateral intra-ocular lens replacement. Other: None. MRI CERVICAL SPINE FINDINGS Alignment: Physiologic. Vertebrae: Mild right-sided C5-6 facet edema and small facet effusions at the left-sided C3-4 and C4-5 levels, likely degenerative. No findings of fracture or discitis. Cord: No abnormal cord signal.  No abnormal enhancement. Posterior Fossa, vertebral arteries, paraspinal tissues: Negative. Disc levels: C2-3: Right greater than left uncovertebral and facet hypertrophy. Mild right foraminal stenosis. No canal stenosis. C3-4: Disc osteophyte complex with bilateral uncovertebral and facet hypertrophy. Moderate bilateral foraminal and moderate canal stenosis. C4-5: Disc osteophyte complex eccentric with bilateral uncovertebral and facet hypertrophy. Severe bilateral foraminal  stenosis. Moderate canal stenosis and right anterior cord impingement cord flattening. C5-6: Disc osteophyte complex with bilateral uncovertebral and facet hypertrophy. Severe bilateral foraminal stenosis and moderate canal stenosis. C6-7: Disc osteophyte complex with left central disc protrusion. Moderate canal stenosis with left central anterior cord impingement. No significant foraminal stenosis. C7-T1: Small disc bulge and facet hypertrophy. Mild canal stenosis. No significant foraminal stenosis. IMPRESSION: MRI head: 1. No acute intracranial process or abnormal enhancement. 2. Mild chronic microvascular ischemic changes and volume loss of the brain. MRI cervical spine: 1. Mild right C5-6 as well as left C3-4 and C4-5 facet effusions, likely degenerative. 2. Advanced cervical spondylosis with disc and facet degenerative changes greatest at the C3-4 through C6-7 levels. 3. Moderate C3-4 through C6-7 canal stenosis. Right anterior C4-5 and left anterior C6-7 cord impingement. No abnormal cord signal. 4. Severe C4-5 and C5-6 bilateral foraminal stenosis. Multilevel mild and moderate foraminal stenosis. Electronically Signed   By: Kristine Garbe M.D.   On: 07/10/2018 00:56   Mr Cervical Spine W Or Wo Contrast  Result Date: 07/10/2018 CLINICAL DATA:  77 y/o M; chief complaint of neck pain with bilateral upper and lower extremity weakness. EXAM: MRI HEAD WITHOUT AND WITH CONTRAST MRI CERVICAL SPINE WITHOUT AND WITH CONTRAST TECHNIQUE: Multiplanar, multiecho pulse sequences of the brain and surrounding structures, and cervical spine, to include the craniocervical junction and cervicothoracic junction, were obtained without and with intravenous contrast. CONTRAST:  10 cc Gadavist. COMPARISON:  07/09/2018 CTA head and neck. FINDINGS: MRI HEAD FINDINGS Brain: No acute infarction, hemorrhage, hydrocephalus, extra-axial collection or mass lesion. Few nonspecific T2 FLAIR hyperintensities in subcortical and  periventricular white matter are compatible with mild chronic microvascular ischemic changes for age. Mild volume loss of the brain. No abnormal enhancement. Vascular: Normal flow voids. Skull and upper cervical spine: Normal marrow signal. Sinuses/Orbits: Mild maxillary sinus mucosal thickening and partial opacification of mastoid air cells. Bilateral intra-ocular lens replacement. Other: None. MRI CERVICAL SPINE FINDINGS Alignment: Physiologic. Vertebrae: Mild right-sided C5-6 facet edema and small facet effusions at the left-sided C3-4 and C4-5 levels, likely degenerative. No findings of fracture or discitis. Cord: No abnormal cord signal.  No abnormal enhancement. Posterior Fossa, vertebral arteries, paraspinal tissues: Negative. Disc levels: C2-3: Right greater than left uncovertebral and facet hypertrophy. Mild right foraminal stenosis. No canal stenosis. C3-4: Disc osteophyte complex with bilateral uncovertebral and facet hypertrophy. Moderate bilateral foraminal and moderate canal stenosis. C4-5: Disc osteophyte complex eccentric with bilateral uncovertebral and facet hypertrophy. Severe bilateral foraminal  stenosis. Moderate canal stenosis and right anterior cord impingement cord flattening. C5-6: Disc osteophyte complex with bilateral uncovertebral and facet hypertrophy. Severe bilateral foraminal stenosis and moderate canal stenosis. C6-7: Disc osteophyte complex with left central disc protrusion. Moderate canal stenosis with left central anterior cord impingement. No significant foraminal stenosis. C7-T1: Small disc bulge and facet hypertrophy. Mild canal stenosis. No significant foraminal stenosis. IMPRESSION: MRI head: 1. No acute intracranial process or abnormal enhancement. 2. Mild chronic microvascular ischemic changes and volume loss of the brain. MRI cervical spine: 1. Mild right C5-6 as well as left C3-4 and C4-5 facet effusions, likely degenerative. 2. Advanced cervical spondylosis with disc and  facet degenerative changes greatest at the C3-4 through C6-7 levels. 3. Moderate C3-4 through C6-7 canal stenosis. Right anterior C4-5 and left anterior C6-7 cord impingement. No abnormal cord signal. 4. Severe C4-5 and C5-6 bilateral foraminal stenosis. Multilevel mild and moderate foraminal stenosis. Electronically Signed   By: Kristine Garbe M.D.   On: 07/10/2018 00:56     MDM  Please see ED clinical course as detailed above.  Patient had MR with and without contrast of brain and C-spine per recommendation of neurology.  MR C-spine shows concern for cord compression.  I spoke with neurosurgery Dr. Zada Finders who requested to hold off on steroids and get patient medically admitted for them to see and evaluate.  I spoke with Dr. Myna Hidalgo from the hospitalist who agreed to admit patient.      Lorin Glass, PA-C 07/10/18 0144    Deno Etienne, DO 07/10/18 1639

## 2018-07-10 NOTE — ED Notes (Signed)
Attempted to call report

## 2018-07-10 NOTE — Progress Notes (Signed)
PROGRESS NOTE  Brief Narrative: Edward [name] is a right-handed 77 y.o. male with a history of CAD s/p CABG, chronic HFpEF, IDDM, OA s/p multiple joint replacements who presented to the ED for 1 week of worsening, constant, severe L>R neck pain with associated numbness and weakness of the right upper extremity. CTA head and neck ruled out vascular occlusion and MRI brain, cervical spine demonstrated advanced cervical spondylosis with cord compression. Neurosurgery was consulted, recommending medical admission.  PSH: Right shoulder arthroscopy 2015, lumber disc surgery, bilateral TKA  Subjective: Pain controlled with medications as ordered. Right hand/arm weak worse than usual which is due to right shoulder surgery. Numbness is stable. No new weakness or numbness noted anywhere. Denies any claudication or exertional dyspnea. No recent chest pain, orthopnea, or PND.  Objective: BP (!) 187/104 (BP Location: Left Arm)   Pulse 77   Temp 97.8 F (36.6 C) (Oral)   Resp 18   SpO2 100%   Gen: 77yo M appearing uncomfortable. Pulm: Clear and nonlabored on room air  CV: RRR, no murmur, no JVD, no edema MSK: Diminished ROM in neck, midline and left paraspinal tenderness in the cervical spine.  Neuro: Alert and oriented. Right shoulder diminished ROM with diminished abduction strength compared to left. Elbow flexion and grip strength 4/5 on right, 5/5 on left.  Skin: Multiple scars including sternotomy, abdominal laparoscopic sites and longitudinal scars on knees bilaterally.  Assessment & Plan: Cervical spondylosis with cord compression, radiculopathy:  - Neurosurgery to assess, keeping NPO in case of surgery today. - Continue pain control as ordered - PT, OT eval  CAD, chronic HFpEF: No recent anginal symptoms or symptoms related to volume overload.  - Cardiology consulted for surgical risk assessment and per patient request to have Dr. Tamala Julian notified of admission.   IDDM:  - Continue insulin  as ordered and follow CBGs.   Otherwise per H&P from this morning by Dr. Myna Hidalgo.   Patrecia Pour, MD 07/10/2018, 9:25 AM

## 2018-07-10 NOTE — Consult Note (Addendum)
Neurosurgery Consultation  Reason for Consult: Right arm weakness Referring Physician: Bonner Puna  CC: Neck pain  HPI: This is a 77 y.o. man that presents with acute left neck pain and approximately 1 week of worsening right arm and right leg weakness. Still able to ambulate without difficulty without use of assistance device. He has had episodic right hand numbness that has now become constant with episodes of pain radiating down the right arm and into all five digits, but worst in the first and second digits. Pain is sharp, radiating, moderate to severe, worse with rotating his neck to the right. No change in bowel or bladder function. + recent use of anti-platelet or anti-coagulant medications - on ASA81 for CAD / CABG / TIA. He feels that he may have some subjective right leg weakness, but his right hand weakness is subjectively much more severe and functionally limiting. No prior episodes c/w central cord syndrome, +right hand clumsiness but no left hand clumsiness.    ROS: A 14 point ROS was performed and is negative except as noted in the HPI.   PMHx:  Past Medical History:  Diagnosis Date  . Adenomatous colon polyp 04/2011  . CAD (coronary artery disease)    a. 01/2015 DES to LAD  b. 12/2015: [place] 85% oLCx lesion--> rx therapy.  . Chronic diastolic CHF (congestive heart failure) ([place])    a. 05/2017 Echo: EF 60-65%, Gr1 DD, no rwma, mildly dil LA, nl RV fxn, mild TR.  . CKD (chronic kidney disease), stage II    a. probable CKD II-III with baseline CR 1.1-1.3.  . DM type 2 (diabetes mellitus, type 2), insulin dependent 01/29/2014   fasting cbg 50-120 with new regimen  . Dyslipidemia, goal LDL below 70 01/29/2014  . Enlarged prostate   . Essential tremor    a. on proprnolol  . GERD (gastroesophageal reflux disease)   . Hypertension   . Internal hemorrhoid   . Sleep apnea    does not use cpap (06/04/2015)  . TIA (transient ischemic attack) 2002   FamHx:  Family History  Problem Relation Age  of Onset  . CAD Brother        2 brothers - CABG  . Alzheimer's disease Sister   . CAD Sister   . Prostate cancer Brother   . Asthma Brother        2 brothers   . Diabetes Other        entire family  . Colon cancer Neg Hx    SocHx:  reports that he has never smoked. He has never used smokeless tobacco. He reports that he does not drink alcohol or use drugs.  Exam: Vital signs in last 24 hours: Temp:  [97.7 F (36.5 C)-98.6 F (37 C)] 97.8 F (36.6 C) (09/10 0906) Pulse Rate:  [68-77] 77 (09/10 0906) Resp:  [10-18] 18 (09/10 0906) BP: (128-187)/(54-104) 187/104 (09/10 0906) SpO2:  [97 %-100 %] 100 % (09/10 0906) General: Awake, alert, cooperative, lying in bed in NAD Head: normocephalic and atruamatic HEENT: +left trapezius increased tone with tenderness to palpation Pulmonary: breathing room air comfortably, no evidence of increased work of breathing Skin: multiple pigmented papular lesions on entire body service Abdomen: obese S NT ND Extremities: warm and well perfused x4 Neuro: AOx3, PERRL, EOMI, FS Strength 5/5 in LUE / LLE RUE Strength: D 5/5, B 4/5, WE 4+/5, T 4/5, G 4-/5 RLE Strength: HF 5/5, KE 5/5, DF 5/5, EHL 5/5, PF 5/5 SILT except for right hand  in all digits, numbness worst in 4th & 5th digits No hoffman's, no clonus   Assessment and Plan: 77 y.o. man p/w neck pain and right upper extremity weakness. 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 On exam, he has RUE weakness in the RUE that is most severe in the right grip, but also in the right biceps, wrist extensors, and triceps. -given patient's recent ASA use and the need for a large anterior cervical exposure, will need to hold ASA for 5 days prior to surgery to minimize risk of post-operative cervical hematoma -will discuss with  patient, will schedule for OR 9/16 for 4 level ACDF, my office will contact him with further details -okay for discharge from a neurosurgical perspective -please call with any concerns or questions  Judith Part, MD 07/10/18 2:25 PM [facility]

## 2018-07-10 NOTE — ED Notes (Addendum)
CBG [id number]

## 2018-07-10 NOTE — ED Notes (Signed)
Pt CBG was 225, notified Bobby(RN)

## 2018-07-10 NOTE — Progress Notes (Signed)
Pt has refused cpap at this time.  Pt states he has not had a recent sleep study and also suffers for claustrophobia and doesn't feel he can tolerate wearing device.  Rt explained he can change his mind later and we will bring it. Rt will monitor.

## 2018-07-10 NOTE — ED Notes (Signed)
Patient currently at MRI

## 2018-07-10 NOTE — H&P (Signed)
History and Physical    Edward [name] VQM:[id number] DOB: [birth year] DOA: 07/09/2018  PCP: Ann Held, DO   Patient coming from: Home   Chief Complaint: Neck pain, right arm numbness and weakness   HPI: Edward [name] is a 77 y.o. male with medical history significant for coronary artery disease status post CABG, chronic diastolic CHF, chronic kidney disease stage II, insulin-dependent diabetes mellitus, hypertension, and osteoarthritis, now presenting to the emergency department for evaluation of severe neck pain and right arm numbness and weakness.  Patient reports that he developed pain in his neck approximately 1 week ago, denies any preceding trauma or unusual activity, and reports associated numbness and weakness involving the right arm.  Reports that his pain is been severe, worse with any movement, more intense on the left side of the neck, and without fevers, chills, sweats, or weight loss.  He reports history of osteoarthritis and is status post bilateral knee replacement and shoulder surgery.  He denies incontinence, saddle anesthesia, or low back pain, but reports some lower extremity weakness that he believes is more chronic.  ED Course: Upon arrival to the ED, patient is found to be afebrile, saturating well on room air, and with vitals otherwise normal.  EKG features a sinus rhythm with sinus arrhythmia and incomplete RBBB.  CTA head and neck is negative for large vessel occlusion or acute vascular abnormality within the head and neck.  MRI brain and cervical spine was obtained and concerning for advanced cervical spondylosis with cord compression and foraminal stenoses.  Chemistry panel is unremarkable and CBC is notable for slight normocytic anemia.  Neurosurgery was consulted by the ED physician and recommended a medical admission.  Patient was given acetaminophen, Percocet, and morphine in the ED.  He remains hemodynamically stable and will be admitted for ongoing evaluation  and management.  Review of Systems:  All other systems reviewed and apart from HPI, are negative.  Past Medical History:  Diagnosis Date  . Adenomatous colon polyp 04/2011  . CAD (coronary artery disease)    a. 01/2015 DES to LAD  b. 12/2015: [place] 85% oLCx lesion--> rx therapy.  . Chronic diastolic CHF (congestive heart failure) ([place])    a. 05/2017 Echo: EF 60-65%, Gr1 DD, no rwma, mildly dil LA, nl RV fxn, mild TR.  . CKD (chronic kidney disease), stage II    a. probable CKD II-III with baseline CR 1.1-1.3.  . DM type 2 (diabetes mellitus, type 2), insulin dependent 01/29/2014   fasting cbg 50-120 with new regimen  . Dyslipidemia, goal LDL below 70 01/29/2014  . Enlarged prostate   . Essential tremor    a. on proprnolol  . GERD (gastroesophageal reflux disease)   . Hypertension   . Internal hemorrhoid   . Sleep apnea    does not use cpap (06/04/2015)  . TIA (transient ischemic attack) 2002    Past Surgical History:  Procedure Laterality Date  . BACK SURGERY    . CARDIAC CATHETERIZATION  01/28/14   + CAD treat medically  . CARDIAC CATHETERIZATION N/A 12/21/2015   Procedure: Right/Left Heart Cath and Coronary Angiography;  Surgeon: Belva Crome, MD;  Location: [facility];  Service: Cardiovascular;  Laterality: N/A;  . CARDIAC CATHETERIZATION N/A 11/02/2016   Procedure: Left Heart Cath and Coronary Angiography;  Surgeon: Nelva Bush, MD;  Location: [facility];  Service: Cardiovascular;  Laterality: N/A;  . CATARACT EXTRACTION Bilateral   . CATARACT EXTRACTION, BILATERAL Bilateral   .  CORONARY ANGIOPLASTY WITH STENT PLACEMENT  01/30/2015   DES [place] to LAD by Dr Tamala Julian  . CORONARY ARTERY BYPASS GRAFT N/A 11/09/2016   Procedure: CORONARY ARTERY BYPASS GRAFTING (CABG) x 2;  Surgeon: Melrose Nakayama, MD;  Location: [facility];  Service: Open Heart Surgery;  Laterality: N/A;  . ENDOVEIN HARVEST OF GREATER SAPHENOUS VEIN Left 11/09/2016   Procedure: ENDOVEIN HARVEST  OF GREATER SAPHENOUS VEIN;  Surgeon: Melrose Nakayama, MD;  Location: [facility];  Service: Open Heart Surgery;  Laterality: Left;  . JOINT REPLACEMENT    . LEFT HEART CATH AND CORS/GRAFTS ANGIOGRAPHY N/A 03/09/2018   Procedure: LEFT HEART CATH AND CORS/GRAFTS ANGIOGRAPHY;  Surgeon: Belva Crome, MD;  Location: [facility];  Service: Cardiovascular;  Laterality: N/A;  . LEFT HEART CATHETERIZATION WITH CORONARY ANGIOGRAM N/A 01/28/2014   Procedure: LEFT HEART CATHETERIZATION WITH CORONARY ANGIOGRAM;  Surgeon: Sinclair Grooms, MD;  Location: [facility] CATH LAB;  Service: Cardiovascular;  Laterality: N/A;  . LEFT HEART CATHETERIZATION WITH CORONARY ANGIOGRAM N/A 01/30/2015   Procedure: LEFT HEART CATHETERIZATION WITH CORONARY ANGIOGRAM;  Surgeon: Belva Crome, MD;  Location[facility] C[facility];  Service: Cardiovascular;  Laterality: N/A;  . LUMBAR [place]    . SHOULDER ARTHROSCOPY Right 07/30/2014   Procedure: Right Shoulder Arthroscopy, Debridement, Decompression, Manipulation Under Anesthesia;  Surgeon: Newt Minion, MD;  Location: [place] [facility];  Service: Orthopedics;  Laterality: Right;  . TEE WITHOUT CARDIOVERSION N/A 11/09/2016   Procedure: TRANSESOPHAGEAL ECHOCARDIOGRAM (TEE);  Surgeon: Melrose Nakayama, MD;  Location: [facility];  Service: Open Heart Surgery;  Laterality: N/A;  . TOTAL KNEE ARTHROPLASTY Bilateral      reports that he has never smoked. He has never used smokeless tobacco. He reports that he does not drink alcohol or use drugs.  Allergies  Allergen Reactions  . Pneumococcal Vaccines Anaphylaxis    Family History  Problem Relation Age of Onset  . CAD Brother        2 brothers - CABG  . Alzheimer's disease Sister   . CAD Sister   . Prostate cancer Brother   . Asthma Brother        2 brothers   . Diabetes Other        entire family  . Colon cancer Neg Hx      Prior to Admission medications   Medication Sig Start Date End Date Taking? Authorizing Provider  acetaminophen  (TYLENOL) 325 MG tablet Take 650 mg by mouth every 6 (six) hours as needed (for neck pain).   Yes [provider]  albuterol (VENTOLIN HFA) 108 (90 Base) MCG/ACT inhaler inhale 2 puffs by mouth every 6 hours if needed for wheezing or shortness of breath 07/24/17  Yes Lowne Lyndal Pulley R, DO  amLODipine (NORVASC) 10 MG tablet Take 1 tablet (10 mg total) by mouth daily. 03/13/18  Yes Ann Held, DO  aspirin EC 81 MG tablet Take 81 mg by mouth daily.   Yes [provider]  bismuth subsalicylate (PEPTO BISMOL) 262 MG/15ML suspension Take 30 mLs by mouth as needed for indigestion.   Yes [provider]  Ciclopirox 1 % shampoo Apply 1 application topically See admin instructions. Massage in scalp three times weekly and leave on for 10 min. Then rinse.   Yes [provider]  clonazePAM (KLONOPIN) 1 MG tablet Take 1 tablet (1 mg total) by mouth at bedtime. Patient taking differently: Take 1 mg by mouth at  bedtime as needed (for sleep or anxiety).  03/13/18  Yes Ann Held, DO  clopidogrel (PLAVIX) 75 MG tablet Take 1 tablet (75 mg total) by mouth daily. 03/13/18  Yes Ann Held, DO  cyclobenzaprine (FLEXERIL) 5 MG tablet Take 1 tablet (5 mg total) by mouth at bedtime as needed for muscle spasms (headache). Patient taking differently: Take 5 mg by mouth at bedtime as needed (for muscle spasms, leg cramps, or headaches).  04/23/18  Yes Patel, Donika K, DO  dicyclomine (BENTYL) 10 MG capsule Take 10 mg by mouth 2 (two) times daily.  01/01/16  Yes [provider]  ENSURE (ENSURE) Take 1-2 Cans by mouth daily.   Yes [provider]  erythromycin ophthalmic ointment Place 1 application into both eyes See admin instructions. Apply to both eyelids once a day as needed for itching   Yes [provider]  esomeprazole (NEXIUM) 40 MG capsule Take 40 mg by mouth daily as needed (for reflux).  01/01/16  Yes [provider]    fenofibrate 160 MG tablet Take 160 mg by mouth daily.  01/01/16  Yes [provider]  furosemide (LASIX) 40 MG tablet Take 1.5 tablets (60 mg total) by mouth daily. 03/29/18 07/10/18 Yes Duke, Tami Lin, PA  gabapentin (NEURONTIN) 300 MG capsule take 1 capsule by mouth at bedtime for 1 week then INCREASE TO 1 CAP TWICE DAILY Patient taking differently: Take 300 mg by mouth 2 (two) times daily.  03/13/18  Yes Roma Schanz R, DO  hydrALAZINE (APRESOLINE) 25 MG tablet Take 1 tablet (25 mg total) by mouth 3 (three) times daily. 03/28/18 07/10/18 Yes Duke, Tami Lin, PA  HYDROcodone-acetaminophen (NORCO/VICODIN) 5-325 MG tablet Take 1 tablet by mouth every 6 (six) hours as needed (for pain).    Yes [provider]  insulin aspart (NOVOLOG) 100 UNIT/ML injection Inject 40 Units into the skin 3 (three) times daily before meals. 06/25/18  Yes Philemon Kingdom, MD  insulin NPH Human (NOVOLIN N RELION) 100 UNIT/ML injection Inject 30 Units into the skin 2 (two) times daily before a meal.    Yes [provider]  meclizine (ANTIVERT) 25 MG tablet Take 1 tablet (25 mg total) by mouth 2 (two) times daily as needed for dizziness. 06/10/17  Yes Theora Gianotti, NP  nitroGLYCERIN (NITROSTAT) 0.4 MG SL tablet Place 1 tablet (0.4 mg total) under the tongue every 5 (five) minutes as needed for chest pain. Max: 3 doses per episode. Patient taking differently: Place 0.4-0.8 mg under the tongue every 5 (five) minutes x 3 doses as needed for chest pain.  11/07/17  Yes Burtis Junes, NP  potassium chloride SA (K-DUR,KLOR-CON) 20 MEQ tablet TAKE 1 & 1/2 (ONE & ONE-HALF) TABLETS BY MOUTH ONCE DAILY Patient taking differently: Take 30 mEq by mouth daily.  04/27/18  Yes Duke, Tami Lin, PA  primidone (MYSOLINE) 50 MG tablet Take 1 tablet (50 mg total) by mouth 2 (two) times daily. 04/23/18  Yes Patel, Donika K, DO  propranolol (INDERAL) 40 MG tablet Take 1 tablet (40 mg total) by  mouth 3 (three) times daily. 03/13/18  Yes Roma Schanz R, DO  psyllium (METAMUCIL) 58.6 % packet Take 1 packet by mouth daily as needed (constipation).   Yes [provider]  rosuvastatin (CRESTOR) 20 MG tablet Take 1 tablet (20 mg total) by mouth daily. Patient taking differently: Take 20 mg by mouth 2 (two) times a week.  03/13/18 03/08/19 Yes  Carollee Herter, Yvonne R, DO  tamsulosin (FLOMAX) 0.4 MG CAPS capsule Take 0.4 mg by mouth daily.   Yes [provider]  diclofenac sodium (VOLTAREN) 1 % GEL Apply 2 g topically 4 (four) times daily as needed (for pain).     [provider]  Insulin Syringe-Needle U-100 (BD INSULIN SYRINGE U/F) 31G X 5/16" 1 ML MISC Use 4 times per day to inject insulin. 01/16/18   Philemon Kingdom, MD  isosorbide mononitrate (IMDUR) 60 MG 24 hr tablet Take by mouth. 01/01/16   [provider]  Polyvinyl Alcohol-Povidone (REFRESH OP) Place 1 drop into both eyes 3 (three) times daily as needed (for dry eyes). Reported on 12/25/2015    [provider]    Physical Exam: Vitals:   07/09/18 1810 07/10/18 0043 07/10/18 0100 07/10/18 0126  BP: (!) 160/54 (!) 141/64 128/66   Pulse: 68 76 74   Resp: 14 16 15    Temp:  98.6 F (37 C)  98.4 F (36.9 C)  TempSrc:  Oral    SpO2: 97% 98% 98%       Constitutional: NAD, calm, in apparent discomfort Eyes: PERTLA, lids and conjunctivae normal ENMT: Mucous membranes are moist. Posterior pharynx clear of any exudate or lesions.   Neck: normal, supple, no masses, no thyromegaly Respiratory: clear to auscultation bilaterally, no wheezing, no crackles. Normal respiratory effort.   Cardiovascular: S1 & S2 heard, regular rate and rhythm. No significant JVD. Abdomen: No distension, no tenderness, soft. Bowel sounds normal.  Musculoskeletal: no clubbing / cyanosis. No joint deformity upper and lower extremities. Mildly tender in midline cervical spine and paraspinous soft tissues on left.    Skin: no significant rashes, lesions, ulcers. Warm, dry, well-perfused. Neurologic: CN 2-12 grossly intact. Sensation to light touch diminished in RUE. Right grip strength 4/5, 5/5 on left.  Psychiatric: Alert and oriented x 3. Pleasant and cooperative.     Labs on Admission: I have personally reviewed following labs and imaging studies  CBC: Recent Labs  Lab 07/09/18 1027  WBC 6.5  HGB 12.2*  HCT 38.3*  MCV 89.9  PLT 170   Basic Metabolic Panel: Recent Labs  Lab 07/09/18 1027  NA 140  K 3.9  CL 105  CO2 29  GLUCOSE 182*  BUN 14  CREATININE 1.11  CALCIUM 8.6*   GFR: Estimated Creatinine Clearance: 73.8 mL/min (by C-G formula based on SCr of 1.11 mg/dL). Liver Function Tests: No results for input(s): AST, ALT, ALKPHOS, BILITOT, PROT, ALBUMIN in the last 168 hours. No results for input(s): LIPASE, AMYLASE in the last 168 hours. No results for input(s): AMMONIA in the last 168 hours. Coagulation Profile: No results for input(s): INR, PROTIME in the last 168 hours. Cardiac Enzymes: No results for input(s): CKTOTAL, CKMB, CKMBINDEX, TROPONINI in the last 168 hours. BNP (last 3 results) No results for input(s): PROBNP in the last 8760 hours. HbA1C: No results for input(s): HGBA1C in the last 72 hours. CBG: Recent Labs  Lab 07/09/18 1014 07/09/18 1322  GLUCAP 209* 72   Lipid Profile: No results for input(s): CHOL, HDL, LDLCALC, TRIG, CHOLHDL, LDLDIRECT in the last 72 hours. Thyroid Function Tests: No results for input(s): TSH, T4TOTAL, FREET4, T3FREE, THYROIDAB in the last 72 hours. Anemia Panel: No results for input(s): VITAMINB12, FOLATE, FERRITIN, TIBC, IRON, RETICCTPCT in the last 72 hours. Urine analysis:    Component Value Date/Time   COLORURINE YELLOW 11/08/2016 [place] 11/08/2016 0955   LABSPEC 1.013 11/08/2016 0955  PHURINE 6.0 11/08/2016 0955   GLUCOSEU 50 (A) 11/08/2016 0955   HGBUR NEGATIVE 11/08/2016 0955   BILIRUBINUR neg  01/02/2017 0959   KETONESUR NEGATIVE 11/08/2016 0955   PROTEINUR small 01/02/2017 0959   PROTEINUR NEGATIVE 11/08/2016 0955   UROBILINOGEN negative 01/02/2017 0959   NITRITE neg 01/02/2017 0959   NITRITE NEGATIVE 11/08/2016 0955   LEUKOCYTESUR Negative 01/02/2017 0959   Sepsis Labs: @LABRCNTIP (procalcitonin:4,lacticidven:4) )No results found for this or any previous visit (from the past 240 hour(s)).   Radiological Exams on Admission: Ct Angio Head W Or Wo Contrast  Result Date: 07/09/2018 CLINICAL DATA:  Initial evaluation for neck pain with extremity weakness. EXAM: CT ANGIOGRAPHY HEAD AND NECK TECHNIQUE: Multidetector CT imaging of the head and neck was performed using the standard protocol during bolus administration of intravenous contrast. Multiplanar CT image reconstructions and MIPs were obtained to evaluate the vascular anatomy. Carotid stenosis measurements (when applicable) are obtained utilizing NASCET criteria, using the distal internal carotid diameter as the denominator. CONTRAST:  <See Chart> ISOVUE-370 IOPAMIDOL (ISOVUE-370) INJECTION 76% COMPARISON:  Prior CT from 05/22/2017. FINDINGS: CT HEAD FINDINGS Brain: Age-related cerebral atrophy with mild chronic small vessel ischemic disease. No acute intracranial hemorrhage. No acute large vessel territory infarct. No mass lesion, midline shift or mass effect. No hydrocephalus. No extra-axial fluid collection. Vascular: No hyperdense vessel. Skull: Scalp soft tissues and calvarium within normal limits. Sinuses: Paranasal sinuses are clear. Trace opacity right mastoid air cells noted, of doubtful significance. Orbits: Globes and orbital soft tissues within normal limits. Review of the MIP images confirms the above findings CTA NECK FINDINGS Aortic arch: Visualized aortic arch of normal caliber with normal 3 vessel morphology. Sequelae of prior CABG partially visualized. Mild atheromatous plaque within the arch and about the origin of the  great vessels without hemodynamically significant stenosis. Visualized subclavian arteries widely patent. Right carotid system: Right common carotid artery patent from its origin to the bifurcation without flow-limiting stenosis. Mild atheromatous irregularity about the right bifurcation with associated stenosis of up to 40% by NASCET criteria. Right ICA widely patent distally to the skull base without stenosis, dissection, or occlusion. Left carotid system: Soft atheromatous plaque within the right common carotid artery with secondary mild diffuse stenosis. Minimal calcified plaque about the left bifurcation without significant narrowing. Left ICA widely patent from the bifurcation to the skull base without stenosis, dissection, or occlusion. Vertebral arteries: Both of the vertebral arteries arise from the subclavian arteries. Focal plaque at the origin of the vertebral arteries bilaterally with associated moderate approximate 50% stenosis on the left and fairly severe stenosis on the right. Left vertebral artery dominant. Vertebral arteries otherwise widely patent within the neck without stenosis, dissection, or occlusion. Skeleton: No acute osseous abnormality. No discrete lytic or blastic osseous lesions. Other neck: No acute soft tissue abnormality within the neck. Few scattered subcentimeter hypodense thyroid nodules noted, of doubtful significance given size and patient age. Upper chest: Visualized upper chest demonstrates no acute finding. Partially visualized lungs are grossly clear. Review of the MIP images confirms the above findings CTA HEAD FINDINGS Anterior circulation: Petrous segments widely patent bilaterally. Mild atheromatous plaque within the cavernous/supraclinoid ICAs without hemodynamically significant stenosis. ICA termini widely patent. A1 segments, anterior communicating artery common anterior cerebral arteries widely patent bilaterally. M1 segments widely patent without stenosis. No  proximal M2 occlusion. Distal MCA branches well perfused and symmetric. Distal small vessel atheromatous irregularity noted. Posterior circulation: Mild atheromatous irregularity within the dominant left vertebral artery without hemodynamically significant  stenosis. Focal moderate distal right V4 segment just beyond the takeoff of the right PICA. Posterior inferior cerebral arteries patent bilaterally. Basilar artery diminutive but widely patent to its distal aspect. Superior cerebral arteries patent bilaterally. Hypoplastic P1 segments with predominant fetal type origin of the PCAs. Moderate to advanced multifocal atheromatous irregularity throughout the PCAs bilaterally with associated moderate to severe multifocal stenoses. Venous sinuses: Patent. Anatomic variants: Fetal type origin of the PCAs.  No aneurysm. Delayed phase: No abnormal enhancement. Review of the MIP images confirms the above findings IMPRESSION: 1. Negative CTA for large vessel occlusion. No other acute vascular abnormality within the head and neck. 2. Moderate intracranial atherosclerotic change, most notable within the bilateral PCAs and distal MCA branches. No other proximal high-grade or correctable stenosis identified. 3. 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 Left vertebral artery dominant. Predominant fetal type origin of the PCAs with overall diminutive vertebrobasilar system. 4. Atheromatous plaque about the right carotid bifurcation with associated stenosis of up to approximately 40% by NASCET criteria. Electronically Signed   By: Jeannine Boga M.D.   On: 07/09/2018 21:44   Ct Angio Neck W And/or Wo Contrast  Result Date: 07/09/2018 CLINICAL DATA:  Initial evaluation for neck pain with extremity weakness. EXAM: CT ANGIOGRAPHY HEAD AND NECK TECHNIQUE: Multidetector CT imaging of the head and neck was performed using the standard protocol during  bolus administration of intravenous contrast. Multiplanar CT image reconstructions and MIPs were obtained to evaluate the vascular anatomy. Carotid stenosis measurements (when applicable) are obtained utilizing NASCET criteria, using the distal internal carotid diameter as the denominator. CONTRAST:  <See Chart> ISOVUE-370 IOPAMIDOL (ISOVUE-370) INJECTION 76% COMPARISON:  Prior CT from 05/22/2017. FINDINGS: CT HEAD FINDINGS Brain: Age-related cerebral atrophy with mild chronic small vessel ischemic disease. No acute intracranial hemorrhage. No acute large vessel territory infarct. No mass lesion, midline shift or mass effect. No hydrocephalus. No extra-axial fluid collection. Vascular: No hyperdense vessel. Skull: Scalp soft tissues and calvarium within normal limits. Sinuses: Paranasal sinuses are clear. Trace opacity right mastoid air cells noted, of doubtful significance. Orbits: Globes and orbital soft tissues within normal limits. Review of the MIP images confirms the above findings CTA NECK FINDINGS Aortic arch: Visualized aortic arch of normal caliber with normal 3 vessel morphology. Sequelae of prior CABG partially visualized. Mild atheromatous plaque within the arch and about the origin of the great vessels without hemodynamically significant stenosis. Visualized subclavian arteries widely patent. Right carotid system: Right common carotid artery patent from its origin to the bifurcation without flow-limiting stenosis. Mild atheromatous irregularity about the right bifurcation with associated stenosis of up to 40% by NASCET criteria. Right ICA widely patent distally to the skull base without stenosis, dissection, or occlusion. Left carotid system: Soft atheromatous plaque within the right common carotid artery with secondary mild diffuse stenosis. Minimal calcified plaque about the left bifurcation without significant narrowing. Left ICA widely patent from the bifurcation to the skull base without stenosis,  dissection, or occlusion. Vertebral arteries: Both of the vertebral arteries arise from the subclavian arteries. Focal plaque at the origin of the vertebral arteries bilaterally with associated moderate approximate 50% stenosis on the left and fairly severe stenosis on the right. Left vertebral artery dominant. Vertebral arteries otherwise widely patent within the neck without stenosis, dissection, or occlusion. Skeleton: No acute osseous abnormality. No discrete lytic or blastic osseous lesions. Other neck: No acute soft tissue abnormality within the neck.  Few scattered subcentimeter hypodense thyroid nodules noted, of doubtful significance given size and patient age. Upper chest: Visualized upper chest demonstrates no acute finding. Partially visualized lungs are grossly clear. Review of the MIP images confirms the above findings CTA HEAD FINDINGS Anterior circulation: Petrous segments widely patent bilaterally. Mild atheromatous plaque within the cavernous/supraclinoid ICAs without hemodynamically significant stenosis. ICA termini widely patent. A1 segments, anterior communicating artery common anterior cerebral arteries widely patent bilaterally. M1 segments widely patent without stenosis. No proximal M2 occlusion. Distal MCA branches well perfused and symmetric. Distal small vessel atheromatous irregularity noted. Posterior circulation: Mild atheromatous irregularity within the dominant left vertebral artery without hemodynamically significant stenosis. Focal moderate distal right V4 segment just beyond the takeoff of the right PICA. Posterior inferior cerebral arteries patent bilaterally. Basilar artery diminutive but widely patent to its distal aspect. Superior cerebral arteries patent bilaterally. Hypoplastic P1 segments with predominant fetal type origin of the PCAs. Moderate to advanced multifocal atheromatous irregularity throughout the PCAs bilaterally with associated moderate to severe multifocal  stenoses. Venous sinuses: Patent. Anatomic variants: Fetal type origin of the PCAs.  No aneurysm. Delayed phase: No abnormal enhancement. Review of the MIP images confirms the above findings IMPRESSION: 1. Negative CTA for large vessel occlusion. No other acute vascular abnormality within the head and neck. 2. Moderate intracranial atherosclerotic change, most notable within the bilateral PCAs and distal MCA branches. No other proximal high-grade or correctable stenosis identified. 3. 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 Left vertebral artery dominant. Predominant fetal type origin of the PCAs with overall diminutive vertebrobasilar system. 4. Atheromatous plaque about the right carotid bifurcation with associated stenosis of up to approximately 40% by NASCET criteria. Electronically Signed   By: Jeannine Boga M.D.   On: 07/09/2018 21:44   Mr Brain W And Wo Contrast  Result Date: 07/10/2018 CLINICAL DATA:  77 y/o M; chief complaint of neck pain with bilateral upper and lower extremity weakness. EXAM: MRI HEAD WITHOUT AND WITH CONTRAST MRI CERVICAL SPINE WITHOUT AND WITH CONTRAST TECHNIQUE: Multiplanar, multiecho pulse sequences of the brain and surrounding structures, and cervical spine, to include the craniocervical junction and cervicothoracic junction, were obtained without and with intravenous contrast. CONTRAST:  10 cc Gadavist. COMPARISON:  07/09/2018 CTA head and neck. FINDINGS: MRI HEAD FINDINGS Brain: No acute infarction, hemorrhage, hydrocephalus, extra-axial collection or mass lesion. Few nonspecific T2 FLAIR hyperintensities in subcortical and periventricular white matter are compatible with mild chronic microvascular ischemic changes for age. Mild volume loss of the brain. No abnormal enhancement. Vascular: Normal flow voids. Skull and upper cervical spine: Normal marrow signal. Sinuses/Orbits: Mild maxillary  sinus mucosal thickening and partial opacification of mastoid air cells. Bilateral intra-ocular lens replacement. Other: None. MRI CERVICAL SPINE FINDINGS Alignment: Physiologic. Vertebrae: Mild right-sided C5-6 facet edema and small facet effusions at the left-sided C3-4 and C4-5 levels, likely degenerative. No findings of fracture or discitis. Cord: No abnormal cord signal.  No abnormal enhancement. Posterior Fossa, vertebral arteries, paraspinal tissues: Negative. Disc levels: C2-3: Right greater than left uncovertebral and facet hypertrophy. Mild right foraminal stenosis. No canal stenosis. C3-4: Disc osteophyte complex with bilateral uncovertebral and facet hypertrophy. Moderate bilateral foraminal and moderate canal stenosis. C4-5: Disc osteophyte complex eccentric with bilateral uncovertebral and facet hypertrophy. Severe bilateral foraminal stenosis. Moderate canal stenosis and right anterior cord impingement cord flattening. C5-6: Disc osteophyte complex with bilateral uncovertebral and facet hypertrophy. Severe bilateral foraminal stenosis and  moderate canal stenosis. C6-7: Disc osteophyte complex with left central disc protrusion. Moderate canal stenosis with left central anterior cord impingement. No significant foraminal stenosis. C7-T1: Small disc bulge and facet hypertrophy. Mild canal stenosis. No significant foraminal stenosis. IMPRESSION: MRI head: 1. No acute intracranial process or abnormal enhancement. 2. Mild chronic microvascular ischemic changes and volume loss of the brain. MRI cervical spine: 1. Mild right C5-6 as well as left C3-4 and C4-5 facet effusions, likely degenerative. 2. Advanced cervical spondylosis with disc and facet degenerative changes greatest at the C3-4 through C6-7 levels. 3. Moderate C3-4 through C6-7 canal stenosis. Right anterior C4-5 and left anterior C6-7 cord impingement. No abnormal cord signal. 4. Severe C4-5 and C5-6 bilateral foraminal stenosis. Multilevel mild  and moderate foraminal stenosis. Electronically Signed   By: Kristine Garbe M.D.   On: 07/10/2018 00:56   Mr Cervical Spine W Or Wo Contrast  Result Date: 07/10/2018 CLINICAL DATA:  77 y/o M; chief complaint of neck pain with bilateral upper and lower extremity weakness. EXAM: MRI HEAD WITHOUT AND WITH CONTRAST MRI CERVICAL SPINE WITHOUT AND WITH CONTRAST TECHNIQUE: Multiplanar, multiecho pulse sequences of the brain and surrounding structures, and cervical spine, to include the craniocervical junction and cervicothoracic junction, were obtained without and with intravenous contrast. CONTRAST:  10 cc Gadavist. COMPARISON:  07/09/2018 CTA head and neck. FINDINGS: MRI HEAD FINDINGS Brain: No acute infarction, hemorrhage, hydrocephalus, extra-axial collection or mass lesion. Few nonspecific T2 FLAIR hyperintensities in subcortical and periventricular white matter are compatible with mild chronic microvascular ischemic changes for age. Mild volume loss of the brain. No abnormal enhancement. Vascular: Normal flow voids. Skull and upper cervical spine: Normal marrow signal. Sinuses/Orbits: Mild maxillary sinus mucosal thickening and partial opacification of mastoid air cells. Bilateral intra-ocular lens replacement. Other: None. MRI CERVICAL SPINE FINDINGS Alignment: Physiologic. Vertebrae: Mild right-sided C5-6 facet edema and small facet effusions at the left-sided C3-4 and C4-5 levels, likely degenerative. No findings of fracture or discitis. Cord: No abnormal cord signal.  No abnormal enhancement. Posterior Fossa, vertebral arteries, paraspinal tissues: Negative. Disc levels: C2-3: Right greater than left uncovertebral and facet hypertrophy. Mild right foraminal stenosis. No canal stenosis. C3-4: Disc osteophyte complex with bilateral uncovertebral and facet hypertrophy. Moderate bilateral foraminal and moderate canal stenosis. C4-5: Disc osteophyte complex eccentric with bilateral uncovertebral and  facet hypertrophy. Severe bilateral foraminal stenosis. Moderate canal stenosis and right anterior cord impingement cord flattening. C5-6: Disc osteophyte complex with bilateral uncovertebral and facet hypertrophy. Severe bilateral foraminal stenosis and moderate canal stenosis. C6-7: Disc osteophyte complex with left central disc protrusion. Moderate canal stenosis with left central anterior cord impingement. No significant foraminal stenosis. C7-T1: Small disc bulge and facet hypertrophy. Mild canal stenosis. No significant foraminal stenosis. IMPRESSION: MRI head: 1. No acute intracranial process or abnormal enhancement. 2. Mild chronic microvascular ischemic changes and volume loss of the brain. MRI cervical spine: 1. Mild right C5-6 as well as left C3-4 and C4-5 facet effusions, likely degenerative. 2. Advanced cervical spondylosis with disc and facet degenerative changes greatest at the C3-4 through C6-7 levels. 3. Moderate C3-4 through C6-7 canal stenosis. Right anterior C4-5 and left anterior C6-7 cord impingement. No abnormal cord signal. 4. Severe C4-5 and C5-6 bilateral foraminal stenosis. Multilevel mild and moderate foraminal stenosis. Electronically Signed   By: Kristine Garbe M.D.   On: 07/10/2018 00:56    EKG: Independently reviewed. Sinus rhythm with sinus arrhythmia, incomplete RBBB.   Assessment/Plan  1. 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 CAD  - No anginal complaints  - Continue statin, nitrates, and beta-blocker; hold ASA and Plavix pending surgeon's evaluation    3. Chronic diastolic CHF  - Appears compensated  - Hold Lasix while NPO,  SLIV, follow daily wt, continue beta-blocker as tolerated    4. Insulin-dependent DM  - A1c was 9.5% last month  - Managed at home with Novolog 40 units TID and insulin NPH 30 units BID - Follow CBG's and start with Levemir 40 units BID and SSI with Novolog    5. Hypertension  - BP at goal  - Continue Norvasc, hydralazine, and propranolol    DVT prophylaxis: SCD's  Code Status: Full  Family Communication: Discussed with patient   Consults called: Neurosurgery  Admission status: Inpatient     Vianne Bulls, MD [facility] Pager [phone]  If 7PM-7AM, please contact night-coverage [email or web address] Password [facility]  07/10/2018, 2:39 AM

## 2018-07-11 ENCOUNTER — Other Ambulatory Visit: Payer: Self-pay | Admitting: Neurological Surgery

## 2018-07-11 DIAGNOSIS — M4802 Spinal stenosis, cervical region: Secondary | ICD-10-CM

## 2018-07-11 DIAGNOSIS — I5032 Chronic diastolic (congestive) heart failure: Secondary | ICD-10-CM

## 2018-07-11 LAB — BASIC METABOLIC PANEL
Anion gap: 8 (ref 5–15)
BUN: 14 mg/dL (ref 8–23)
CALCIUM: 8.6 mg/dL — AB (ref 8.9–10.3)
CHLORIDE: 102 mmol/L (ref 98–111)
CO2: 26 mmol/L (ref 22–32)
Creatinine, Ser: 1.19 mg/dL (ref 0.61–1.24)
GFR calc non Af Amer: 58 mL/min — ABNORMAL LOW (ref >60)
Glucose, Bld: 246 mg/dL — ABNORMAL HIGH (ref 70–99)
Potassium: 4.8 mmol/L (ref 3.5–5.1)
SODIUM: 136 mmol/L (ref 135–145)

## 2018-07-11 LAB — GLUCOSE, CAPILLARY
GLUCOSE-CAPILLARY: 275 mg/dL — AB (ref 70–99)
Glucose-Capillary: 167 mg/dL — ABNORMAL HIGH (ref 70–99)
Glucose-Capillary: 210 mg/dL — ABNORMAL HIGH (ref 70–99)
Glucose-Capillary: 243 mg/dL — ABNORMAL HIGH (ref 70–99)
Glucose-Capillary: 277 mg/dL — ABNORMAL HIGH (ref 70–99)
Glucose-Capillary: 294 mg/dL — ABNORMAL HIGH (ref 70–99)

## 2018-07-11 MED ORDER — ASPIRIN EC 81 MG PO TBEC: 81.0000 mg | DELAYED_RELEASE_TABLET | Freq: Every day | ORAL | Status: DC

## 2018-07-11 MED ORDER — OXYCODONE-ACETAMINOPHEN 5-325 MG PO TABS
1.0000 | ORAL_TABLET | ORAL | Status: DC | PRN
  Administered 2018-07-11 – 2018-07-12 (×4): 2 via ORAL
  Filled 2018-07-11 (×4): qty 2

## 2018-07-11 MED ORDER — CLOPIDOGREL BISULFATE 75 MG PO TABS: 75.0000 mg | ORAL_TABLET | Freq: Every day | ORAL | 0 refills | Status: DC

## 2018-07-11 MED ORDER — SENNOSIDES-DOCUSATE SODIUM 8.6-50 MG PO TABS: 1.0000 | ORAL_TABLET | Freq: Every evening | ORAL | 0 refills | Status: DC | PRN

## 2018-07-11 MED ORDER — OXYCODONE-ACETAMINOPHEN 5-325 MG PO TABS: 1.0000 | ORAL_TABLET | ORAL | 0 refills | Status: DC | PRN

## 2018-07-11 MED ORDER — HYDROMORPHONE HCL 1 MG/ML IJ SOLN
1.0000 mg | INTRAMUSCULAR | Status: DC | PRN
  Filled 2018-07-11: qty 1

## 2018-07-11 NOTE — Consult Note (Addendum)
   [facility] CM Inpatient Consult   07/11/2018  Edward [name] [birth year] [id number]    Patient screened for potential [facility] Care Management program due to unplanned readmission risk score of 25% (high).   Went to bedside to speak with Edward [name] about [facility] Management program services for DM education and management. He states "you are the 3rd person that has been in here talking about home care or things like that. Please leave your card and information and I will call you". Explained General EMMI post discharge calls. He is agreeable to General EMMI calls. Provided [facility] Care Management brochure with contact information and 24-hr nurse advice line magnet.  Edward [name] denies having any concerns with obtaining medications or with transportation. States his blood sugars have been running high but " I know what to do".  Confirms Primary Care MD is Dr. Carollee Herter (PCP office listed as doing transition of care calls). Made inpatient RNCM aware [facility] Care Management services were declined but General EMMI calls accepted.   Will make referral for General EMMI.  Marthenia Rolling, MSN-Ed, RN,BSN [facility] Liaison [phone]

## 2018-07-11 NOTE — Progress Notes (Signed)
Progress Note  Patient Name: Edward [name] Date of Encounter: 07/11/2018  Primary Cardiologist: Sinclair Grooms, MD    Subjective   No chest pain.  Inpatient Medications    Scheduled Meds: . amLODipine  10 mg Oral Daily  . dicyclomine  10 mg Oral BID  . gabapentin  300 mg Oral BID  . hydrALAZINE  50 mg Oral TID  . insulin aspart  0-9 Units Subcutaneous Q4H  . insulin detemir  40 Units Subcutaneous BID  . pantoprazole  40 mg Oral Daily  . primidone  50 mg Oral BID  . propranolol  40 mg Oral TID  . [START ON 07/12/2018] rosuvastatin  20 mg Oral Once per day on Mon Thu  . sodium chloride flush  3 mL Intravenous Q12H  . tamsulosin  0.4 mg Oral Daily   Continuous Infusions: . sodium chloride     PRN Meds: sodium chloride, acetaminophen **OR** acetaminophen, albuterol, bisacodyl, clonazePAM, cyclobenzaprine, morphine injection, ondansetron **OR** ondansetron (ZOFRAN) IV, oxyCODONE-acetaminophen, polyvinyl alcohol, senna-docusate, sodium chloride flush   Vital Signs    Vitals:   07/10/18 1947 07/11/18 0620 07/11/18 0850 07/11/18 0854  BP: 125/66 (!) 152/70 (!) 144/59   Pulse: 72 68 64   Resp: 18 18 18    Temp: 98.1 F (36.7 C) 97.6 F (36.4 C) (!) 97.3 F (36.3 C)   TempSrc: Oral Oral Oral   SpO2: 99% 98% 100%   Height:    5\' 8"  (1.727 m)    Intake/Output Summary (Last 24 hours) at 07/11/2018 1208 Last data filed at 07/11/2018 0900 Gross per 24 hour  Intake 420 ml  Output 0 ml  Net 420 ml   There were no vitals filed for this visit.  Telemetry    ECG    None recent - Personally Reviewed  Physical Exam   GEN: No acute distress.   Neck: No JVD Cardiac: RRR, no murmurs, rubs, or gallops.  Respiratory: Clear to auscultation bilaterally. GI: Soft, nontender, non-distended  MS: tr LE edema; No deformity. Neuro:  Nonfocal  Psych: Normal affect   Labs    Chemistry Recent Labs  Lab 07/09/18 1027 07/10/18 0430 07/11/18 1046  NA 140 136 136  K 3.9  4.2 4.8  CL 105 102 102  CO2 29 27 26   GLUCOSE 182* 335* 246*  BUN 14 10 14   CREATININE 1.11 1.06 1.19  CALCIUM 8.6* 8.2* 8.6*  GFRNONAA >60 >60 58*  GFRAA >60 >60 >60  ANIONGAP 6 7 8      Hematology Recent Labs  Lab 07/09/18 1027  WBC 6.5  RBC 4.26  HGB 12.2*  HCT 38.3*  MCV 89.9  MCH 28.6  MCHC 31.9  RDW 13.5  PLT 229    Cardiac EnzymesNo results for input(s): TROPONINI in the last 168 hours. No results for input(s): TROPIPOC in the last 168 hours.   BNPNo results for input(s): BNP, PROBNP in the last 168 hours.   DDimer No results for input(s): DDIMER in the last 168 hours.   Radiology    Ct Angio Head W Or Wo Contrast  Result Date: 07/09/2018 CLINICAL DATA:  Initial evaluation for neck pain with extremity weakness. EXAM: CT ANGIOGRAPHY HEAD AND NECK TECHNIQUE: Multidetector CT imaging of the head and neck was performed using the standard protocol during bolus administration of intravenous contrast. Multiplanar CT image reconstructions and MIPs were obtained to evaluate the vascular anatomy. Carotid stenosis measurements (when applicable) are obtained utilizing NASCET criteria, using the distal  internal carotid diameter as the denominator. CONTRAST:  <See Chart> ISOVUE-370 IOPAMIDOL (ISOVUE-370) INJECTION 76% COMPARISON:  Prior CT from 05/22/2017. FINDINGS: CT HEAD FINDINGS Brain: Age-related cerebral atrophy with mild chronic small vessel ischemic disease. No acute intracranial hemorrhage. No acute large vessel territory infarct. No mass lesion, midline shift or mass effect. No hydrocephalus. No extra-axial fluid collection. Vascular: No hyperdense vessel. Skull: Scalp soft tissues and calvarium within normal limits. Sinuses: Paranasal sinuses are clear. Trace opacity right mastoid air cells noted, of doubtful significance. Orbits: Globes and orbital soft tissues within normal limits. Review of the MIP images confirms the above findings CTA NECK FINDINGS Aortic arch: Visualized  aortic arch of normal caliber with normal 3 vessel morphology. Sequelae of prior CABG partially visualized. Mild atheromatous plaque within the arch and about the origin of the great vessels without hemodynamically significant stenosis. Visualized subclavian arteries widely patent. Right carotid system: Right common carotid artery patent from its origin to the bifurcation without flow-limiting stenosis. Mild atheromatous irregularity about the right bifurcation with associated stenosis of up to 40% by NASCET criteria. Right ICA widely patent distally to the skull base without stenosis, dissection, or occlusion. Left carotid system: Soft atheromatous plaque within the right common carotid artery with secondary mild diffuse stenosis. Minimal calcified plaque about the left bifurcation without significant narrowing. Left ICA widely patent from the bifurcation to the skull base without stenosis, dissection, or occlusion. Vertebral arteries: Both of the vertebral arteries arise from the subclavian arteries. Focal plaque at the origin of the vertebral arteries bilaterally with associated moderate approximate 50% stenosis on the left and fairly severe stenosis on the right. Left vertebral artery dominant. Vertebral arteries otherwise widely patent within the neck without stenosis, dissection, or occlusion. Skeleton: No acute osseous abnormality. No discrete lytic or blastic osseous lesions. Other neck: No acute soft tissue abnormality within the neck. Few scattered subcentimeter hypodense thyroid nodules noted, of doubtful significance given size and patient age. Upper chest: Visualized upper chest demonstrates no acute finding. Partially visualized lungs are grossly clear. Review of the MIP images confirms the above findings CTA HEAD FINDINGS Anterior circulation: Petrous segments widely patent bilaterally. Mild atheromatous plaque within the cavernous/supraclinoid ICAs without hemodynamically significant stenosis. ICA  termini widely patent. A1 segments, anterior communicating artery common anterior cerebral arteries widely patent bilaterally. M1 segments widely patent without stenosis. No proximal M2 occlusion. Distal MCA branches well perfused and symmetric. Distal small vessel atheromatous irregularity noted. Posterior circulation: Mild atheromatous irregularity within the dominant left vertebral artery without hemodynamically significant stenosis. Focal moderate distal right V4 segment just beyond the takeoff of the right PICA. Posterior inferior cerebral arteries patent bilaterally. Basilar artery diminutive but widely patent to its distal aspect. Superior cerebral arteries patent bilaterally. Hypoplastic P1 segments with predominant fetal type origin of the PCAs. Moderate to advanced multifocal atheromatous irregularity throughout the PCAs bilaterally with associated moderate to severe multifocal stenoses. Venous sinuses: Patent. Anatomic variants: Fetal type origin of the PCAs.  No aneurysm. Delayed phase: No abnormal enhancement. Review of the MIP images confirms the above findings IMPRESSION: 1. Negative CTA for large vessel occlusion. No other acute vascular abnormality within the head and neck. 2. Moderate intracranial atherosclerotic change, most notable within the bilateral PCAs and distal MCA branches. No other proximal high-grade or correctable stenosis identified. 3. 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 Left vertebral artery dominant. Predominant fetal type origin of the PCAs with overall  diminutive vertebrobasilar system. 4. Atheromatous plaque about the right carotid bifurcation with associated stenosis of up to approximately 40% by NASCET criteria. Electronically Signed   By: Jeannine Boga M.D.   On: 07/09/2018 21:44   Ct Angio Neck W And/or Wo Contrast  Result Date: 07/09/2018 CLINICAL DATA:  Initial evaluation for  neck pain with extremity weakness. EXAM: CT ANGIOGRAPHY HEAD AND NECK TECHNIQUE: Multidetector CT imaging of the head and neck was performed using the standard protocol during bolus administration of intravenous contrast. Multiplanar CT image reconstructions and MIPs were obtained to evaluate the vascular anatomy. Carotid stenosis measurements (when applicable) are obtained utilizing NASCET criteria, using the distal internal carotid diameter as the denominator. CONTRAST:  <See Chart> ISOVUE-370 IOPAMIDOL (ISOVUE-370) INJECTION 76% COMPARISON:  Prior CT from 05/22/2017. FINDINGS: CT HEAD FINDINGS Brain: Age-related cerebral atrophy with mild chronic small vessel ischemic disease. No acute intracranial hemorrhage. No acute large vessel territory infarct. No mass lesion, midline shift or mass effect. No hydrocephalus. No extra-axial fluid collection. Vascular: No hyperdense vessel. Skull: Scalp soft tissues and calvarium within normal limits. Sinuses: Paranasal sinuses are clear. Trace opacity right mastoid air cells noted, of doubtful significance. Orbits: Globes and orbital soft tissues within normal limits. Review of the MIP images confirms the above findings CTA NECK FINDINGS Aortic arch: Visualized aortic arch of normal caliber with normal 3 vessel morphology. Sequelae of prior CABG partially visualized. Mild atheromatous plaque within the arch and about the origin of the great vessels without hemodynamically significant stenosis. Visualized subclavian arteries widely patent. Right carotid system: Right common carotid artery patent from its origin to the bifurcation without flow-limiting stenosis. Mild atheromatous irregularity about the right bifurcation with associated stenosis of up to 40% by NASCET criteria. Right ICA widely patent distally to the skull base without stenosis, dissection, or occlusion. Left carotid system: Soft atheromatous plaque within the right common carotid artery with secondary mild  diffuse stenosis. Minimal calcified plaque about the left bifurcation without significant narrowing. Left ICA widely patent from the bifurcation to the skull base without stenosis, dissection, or occlusion. Vertebral arteries: Both of the vertebral arteries arise from the subclavian arteries. Focal plaque at the origin of the vertebral arteries bilaterally with associated moderate approximate 50% stenosis on the left and fairly severe stenosis on the right. Left vertebral artery dominant. Vertebral arteries otherwise widely patent within the neck without stenosis, dissection, or occlusion. Skeleton: No acute osseous abnormality. No discrete lytic or blastic osseous lesions. Other neck: No acute soft tissue abnormality within the neck. Few scattered subcentimeter hypodense thyroid nodules noted, of doubtful significance given size and patient age. Upper chest: Visualized upper chest demonstrates no acute finding. Partially visualized lungs are grossly clear. Review of the MIP images confirms the above findings CTA HEAD FINDINGS Anterior circulation: Petrous segments widely patent bilaterally. Mild atheromatous plaque within the cavernous/supraclinoid ICAs without hemodynamically significant stenosis. ICA termini widely patent. A1 segments, anterior communicating artery common anterior cerebral arteries widely patent bilaterally. M1 segments widely patent without stenosis. No proximal M2 occlusion. Distal MCA branches well perfused and symmetric. Distal small vessel atheromatous irregularity noted. Posterior circulation: Mild atheromatous irregularity within the dominant left vertebral artery without hemodynamically significant stenosis. Focal moderate distal right V4 segment just beyond the takeoff of the right PICA. Posterior inferior cerebral arteries patent bilaterally. Basilar artery diminutive but widely patent to its distal aspect. Superior cerebral arteries patent bilaterally. Hypoplastic P1 segments with  predominant fetal type origin of the PCAs. Moderate to advanced multifocal atheromatous  irregularity throughout the PCAs bilaterally with associated moderate to severe multifocal stenoses. Venous sinuses: Patent. Anatomic variants: Fetal type origin of the PCAs.  No aneurysm. Delayed phase: No abnormal enhancement. Review of the MIP images confirms the above findings IMPRESSION: 1. Negative CTA for large vessel occlusion. No other acute vascular abnormality within the head and neck. 2. Moderate intracranial atherosclerotic change, most notable within the bilateral PCAs and distal MCA branches. No other proximal high-grade or correctable stenosis identified. 3. 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 Left vertebral artery dominant. Predominant fetal type origin of the PCAs with overall diminutive vertebrobasilar system. 4. Atheromatous plaque about the right carotid bifurcation with associated stenosis of up to approximately 40% by NASCET criteria. Electronically Signed   By: Jeannine Boga M.D.   On: 07/09/2018 21:44   Mr Brain W And Wo Contrast  Result Date: 07/10/2018 CLINICAL DATA:  77 y/o M; chief complaint of neck pain with bilateral upper and lower extremity weakness. EXAM: MRI HEAD WITHOUT AND WITH CONTRAST MRI CERVICAL SPINE WITHOUT AND WITH CONTRAST TECHNIQUE: Multiplanar, multiecho pulse sequences of the brain and surrounding structures, and cervical spine, to include the craniocervical junction and cervicothoracic junction, were obtained without and with intravenous contrast. CONTRAST:  10 cc Gadavist. COMPARISON:  07/09/2018 CTA head and neck. FINDINGS: MRI HEAD FINDINGS Brain: No acute infarction, hemorrhage, hydrocephalus, extra-axial collection or mass lesion. Few nonspecific T2 FLAIR hyperintensities in subcortical and periventricular white matter are compatible with mild chronic microvascular ischemic changes  for age. Mild volume loss of the brain. No abnormal enhancement. Vascular: Normal flow voids. Skull and upper cervical spine: Normal marrow signal. Sinuses/Orbits: Mild maxillary sinus mucosal thickening and partial opacification of mastoid air cells. Bilateral intra-ocular lens replacement. Other: None. MRI CERVICAL SPINE FINDINGS Alignment: Physiologic. Vertebrae: Mild right-sided C5-6 facet edema and small facet effusions at the left-sided C3-4 and C4-5 levels, likely degenerative. No findings of fracture or discitis. Cord: No abnormal cord signal.  No abnormal enhancement. Posterior Fossa, vertebral arteries, paraspinal tissues: Negative. Disc levels: C2-3: Right greater than left uncovertebral and facet hypertrophy. Mild right foraminal stenosis. No canal stenosis. C3-4: Disc osteophyte complex with bilateral uncovertebral and facet hypertrophy. Moderate bilateral foraminal and moderate canal stenosis. C4-5: Disc osteophyte complex eccentric with bilateral uncovertebral and facet hypertrophy. Severe bilateral foraminal stenosis. Moderate canal stenosis and right anterior cord impingement cord flattening. C5-6: Disc osteophyte complex with bilateral uncovertebral and facet hypertrophy. Severe bilateral foraminal stenosis and moderate canal stenosis. C6-7: Disc osteophyte complex with left central disc protrusion. Moderate canal stenosis with left central anterior cord impingement. No significant foraminal stenosis. C7-T1: Small disc bulge and facet hypertrophy. Mild canal stenosis. No significant foraminal stenosis. IMPRESSION: MRI head: 1. No acute intracranial process or abnormal enhancement. 2. Mild chronic microvascular ischemic changes and volume loss of the brain. MRI cervical spine: 1. Mild right C5-6 as well as left C3-4 and C4-5 facet effusions, likely degenerative. 2. Advanced cervical spondylosis with disc and facet degenerative changes greatest at the C3-4 through C6-7 levels. 3. Moderate C3-4  through C6-7 canal stenosis. Right anterior C4-5 and left anterior C6-7 cord impingement. No abnormal cord signal. 4. Severe C4-5 and C5-6 bilateral foraminal stenosis. Multilevel mild and moderate foraminal stenosis. Electronically Signed   By: Kristine Garbe M.D.   On: 07/10/2018 00:56   Mr Cervical Spine W Or Wo Contrast  Result Date: 07/10/2018 CLINICAL DATA:  77 y/o M; chief  complaint of neck pain with bilateral upper and lower extremity weakness. EXAM: MRI HEAD WITHOUT AND WITH CONTRAST MRI CERVICAL SPINE WITHOUT AND WITH CONTRAST TECHNIQUE: Multiplanar, multiecho pulse sequences of the brain and surrounding structures, and cervical spine, to include the craniocervical junction and cervicothoracic junction, were obtained without and with intravenous contrast. CONTRAST:  10 cc Gadavist. COMPARISON:  07/09/2018 CTA head and neck. FINDINGS: MRI HEAD FINDINGS Brain: No acute infarction, hemorrhage, hydrocephalus, extra-axial collection or mass lesion. Few nonspecific T2 FLAIR hyperintensities in subcortical and periventricular white matter are compatible with mild chronic microvascular ischemic changes for age. Mild volume loss of the brain. No abnormal enhancement. Vascular: Normal flow voids. Skull and upper cervical spine: Normal marrow signal. Sinuses/Orbits: Mild maxillary sinus mucosal thickening and partial opacification of mastoid air cells. Bilateral intra-ocular lens replacement. Other: None. MRI CERVICAL SPINE FINDINGS Alignment: Physiologic. Vertebrae: Mild right-sided C5-6 facet edema and small facet effusions at the left-sided C3-4 and C4-5 levels, likely degenerative. No findings of fracture or discitis. Cord: No abnormal cord signal.  No abnormal enhancement. Posterior Fossa, vertebral arteries, paraspinal tissues: Negative. Disc levels: C2-3: Right greater than left uncovertebral and facet hypertrophy. Mild right foraminal stenosis. No canal stenosis. C3-4: Disc osteophyte complex  with bilateral uncovertebral and facet hypertrophy. Moderate bilateral foraminal and moderate canal stenosis. C4-5: Disc osteophyte complex eccentric with bilateral uncovertebral and facet hypertrophy. Severe bilateral foraminal stenosis. Moderate canal stenosis and right anterior cord impingement cord flattening. C5-6: Disc osteophyte complex with bilateral uncovertebral and facet hypertrophy. Severe bilateral foraminal stenosis and moderate canal stenosis. C6-7: Disc osteophyte complex with left central disc protrusion. Moderate canal stenosis with left central anterior cord impingement. No significant foraminal stenosis. C7-T1: Small disc bulge and facet hypertrophy. Mild canal stenosis. No significant foraminal stenosis. IMPRESSION: MRI head: 1. No acute intracranial process or abnormal enhancement. 2. Mild chronic microvascular ischemic changes and volume loss of the brain. MRI cervical spine: 1. Mild right C5-6 as well as left C3-4 and C4-5 facet effusions, likely degenerative. 2. Advanced cervical spondylosis with disc and facet degenerative changes greatest at the C3-4 through C6-7 levels. 3. Moderate C3-4 through C6-7 canal stenosis. Right anterior C4-5 and left anterior C6-7 cord impingement. No abnormal cord signal. 4. Severe C4-5 and C5-6 bilateral foraminal stenosis. Multilevel mild and moderate foraminal stenosis. Electronically Signed   By: Kristine Garbe M.D.   On: 07/10/2018 00:56    Cardiac Studies   Cath report  Patient Profile     77 y.o. male who needs neck surgery  Assessment & Plan    1) No further cardiac testing needed.  Holding aspirin and PLavix 5 days prior to surgery. [facility] will sign off.   Medication Recommendations:  Hold Plavix and aspirin 5 days prior to surgery Other recommendations (labs, testing, etc):  Restart when safe from a bleeding standpoint Follow up as an outpatient:  As schelduled on 10/1  For questions or updates, please contact [facility] Please consult [email or web address] for contact info under        Signed, Larae Grooms, MD  07/11/2018, 12:08 PM

## 2018-07-11 NOTE — Progress Notes (Addendum)
PROGRESS NOTE    Edward [name]  KGU:[id number] DOB: 08/29/1941 DOA: 07/09/2018 PCP: Ann Held, DO    Brief Narrative: Edward [name] is a right-handed 77 y.o. male with a history of CAD s/p CABG, chronic HFpEF, IDDM, OA s/p multiple joint replacements who presented to the ED for 1 week of worsening, constant, severe L>R neck pain with associated numbness and weakness of the right upper extremity. CTA head and neck ruled out vascular occlusion and MRI brain, cervical spine demonstrated advanced cervical spondylosis with cord compression. Neurosurgery was consulted, recommending medical admission  Assessment & Plan:   Principal Problem:   Cervical radiculopathy Active Problems:   Essential hypertension   Obstructive sleep apnea   Chronic diastolic heart failure (HCC)   Coronary artery disease involving coronary bypass graft of native heart with angina pectoris (HCC)   Anxiety   Cervical spinal stenosis  Cervical spondylosis with radiculopathy:  Neuro surgery consulted and recommendations given. Plan for surgery on the 16 th.  Pain control with IV dilaudid now that morphine is not working.  PT eval.  Home once pain is controlled.    CAD: NO chest pain or sob.    Chronic diastolic heart failure:  Cardiology consulted for pre op clearance, and recommendations given.    Insulin dep DM CBG (last 3)  Recent Labs    07/11/18 0847 07/11/18 1004 07/11/18 1233  GLUCAP 167* >600* 275*   Resume SSI.    Hypertension; well controlled.     DVT prophylaxis: (lovenox.  Code Status: FULL CODE.  Family Communication: none at bedside.  Disposition Plan:pending improvement in the pain.    Consultants:   Neuro surgery.    Procedures: none.   Antimicrobials: none.   Subjective: Reports his pain is 9/10 with IV morphine and 2 doses of oxycodone, and IV diluadid ordered for pain control.   Objective: Vitals:   07/10/18 1947 07/11/18 0620 07/11/18 0850 07/11/18  0854  BP: 125/66 (!) 152/70 (!) 144/59   Pulse: 72 68 64   Resp: 18 18 18    Temp: 98.1 F (36.7 C) 97.6 F (36.4 C) (!) 97.3 F (36.3 C)   TempSrc: Oral Oral Oral   SpO2: 99% 98% 100%   Weight:    127.6 kg  Height:    5\' 8"  (1.727 m)    Intake/Output Summary (Last 24 hours) at 07/11/2018 1604 Last data filed at 07/11/2018 1500 Gross per 24 hour  Intake 720 ml  Output 0 ml  Net 720 ml   Filed Weights   07/11/18 0854  Weight: 127.6 kg    Examination:  General exam: Appears to be in mod distress from pain.  Respiratory system: Clear to auscultation. Respiratory effort normal. Cardiovascular system: S1 & S2 heard, RRR. No JVD, murmurs, rubs, gallops or clicks. No pedal edema. Gastrointestinal system: Abdomen is nondistended, soft and nontender. No organomegaly or masses felt. Normal bowel sounds heard. Central nervous system: Alert and oriented. Right upper extremity weakness. Left paraspinal tenderness.  Extremities: no pedal edema.  Skin: Multiple scars including sternotomy, abdominal laparoscopic sites and longitudinal scars on knees bilaterally. Psychiatry:Mood & affect appropriate.     Data Reviewed: I have personally reviewed following labs and imaging studies  CBC: Recent Labs  Lab 07/09/18 1027  WBC 6.5  HGB 12.2*  HCT 38.3*  MCV 89.9  PLT 628   Basic Metabolic Panel: Recent Labs  Lab 07/09/18 1027 07/10/18 0430 07/11/18 1046  NA 140 136 136  K  3.9 4.2 4.8  CL 105 102 102  CO2 29 27 26   GLUCOSE 182* 335* 246*  BUN 14 10 14   CREATININE 1.11 1.06 1.19  CALCIUM 8.6* 8.2* 8.6*   GFR: Estimated Creatinine Clearance: 68.8 mL/min (by C-G formula based on SCr of 1.19 mg/dL). Liver Function Tests: No results for input(s): AST, ALT, ALKPHOS, BILITOT, PROT, ALBUMIN in the last 168 hours. No results for input(s): LIPASE, AMYLASE in the last 168 hours. No results for input(s): AMMONIA in the last 168 hours. Coagulation Profile: Recent Labs  Lab  07/10/18 0430  INR 1.09   Cardiac Enzymes: No results for input(s): CKTOTAL, CKMB, CKMBINDEX, TROPONINI in the last 168 hours. BNP (last 3 results) No results for input(s): PROBNP in the last 8760 hours. HbA1C: No results for input(s): HGBA1C in the last 72 hours. CBG: Recent Labs  Lab 07/11/18 0013 07/11/18 0347 07/11/18 0847 07/11/18 1004 07/11/18 1233  GLUCAP 294* 243* 167* >600* 275*   Lipid Profile: No results for input(s): CHOL, HDL, LDLCALC, TRIG, CHOLHDL, LDLDIRECT in the last 72 hours. Thyroid Function Tests: No results for input(s): TSH, T4TOTAL, FREET4, T3FREE, THYROIDAB in the last 72 hours. Anemia Panel: No results for input(s): VITAMINB12, FOLATE, FERRITIN, TIBC, IRON, RETICCTPCT in the last 72 hours. Sepsis Labs: No results for input(s): PROCALCITON, LATICACIDVEN in the last 168 hours.  No results found for this or any previous visit (from the past 240 hour(s)).       Radiology Studies: Ct Angio Head W Or Wo Contrast  Result Date: 07/09/2018 CLINICAL DATA:  Initial evaluation for neck pain with extremity weakness. EXAM: CT ANGIOGRAPHY HEAD AND NECK TECHNIQUE: Multidetector CT imaging of the head and neck was performed using the standard protocol during bolus administration of intravenous contrast. Multiplanar CT image reconstructions and MIPs were obtained to evaluate the vascular anatomy. Carotid stenosis measurements (when applicable) are obtained utilizing NASCET criteria, using the distal internal carotid diameter as the denominator. CONTRAST:  <See Chart> ISOVUE-370 IOPAMIDOL (ISOVUE-370) INJECTION 76% COMPARISON:  Prior CT from 05/22/2017. FINDINGS: CT HEAD FINDINGS Brain: Age-related cerebral atrophy with mild chronic small vessel ischemic disease. No acute intracranial hemorrhage. No acute large vessel territory infarct. No mass lesion, midline shift or mass effect. No hydrocephalus. No extra-axial fluid collection. Vascular: No hyperdense vessel. Skull:  Scalp soft tissues and calvarium within normal limits. Sinuses: Paranasal sinuses are clear. Trace opacity right mastoid air cells noted, of doubtful significance. Orbits: Globes and orbital soft tissues within normal limits. Review of the MIP images confirms the above findings CTA NECK FINDINGS Aortic arch: Visualized aortic arch of normal caliber with normal 3 vessel morphology. Sequelae of prior CABG partially visualized. Mild atheromatous plaque within the arch and about the origin of the great vessels without hemodynamically significant stenosis. Visualized subclavian arteries widely patent. Right carotid system: Right common carotid artery patent from its origin to the bifurcation without flow-limiting stenosis. Mild atheromatous irregularity about the right bifurcation with associated stenosis of up to 40% by NASCET criteria. Right ICA widely patent distally to the skull base without stenosis, dissection, or occlusion. Left carotid system: Soft atheromatous plaque within the right common carotid artery with secondary mild diffuse stenosis. Minimal calcified plaque about the left bifurcation without significant narrowing. Left ICA widely patent from the bifurcation to the skull base without stenosis, dissection, or occlusion. Vertebral arteries: Both of the vertebral arteries arise from the subclavian arteries. Focal plaque at the origin of the vertebral arteries bilaterally with associated moderate approximate 50% stenosis  on the left and fairly severe stenosis on the right. Left vertebral artery dominant. Vertebral arteries otherwise widely patent within the neck without stenosis, dissection, or occlusion. Skeleton: No acute osseous abnormality. No discrete lytic or blastic osseous lesions. Other neck: No acute soft tissue abnormality within the neck. Few scattered subcentimeter hypodense thyroid nodules noted, of doubtful significance given size and patient age. Upper chest: Visualized upper chest  demonstrates no acute finding. Partially visualized lungs are grossly clear. Review of the MIP images confirms the above findings CTA HEAD FINDINGS Anterior circulation: Petrous segments widely patent bilaterally. Mild atheromatous plaque within the cavernous/supraclinoid ICAs without hemodynamically significant stenosis. ICA termini widely patent. A1 segments, anterior communicating artery common anterior cerebral arteries widely patent bilaterally. M1 segments widely patent without stenosis. No proximal M2 occlusion. Distal MCA branches well perfused and symmetric. Distal small vessel atheromatous irregularity noted. Posterior circulation: Mild atheromatous irregularity within the dominant left vertebral artery without hemodynamically significant stenosis. Focal moderate distal right V4 segment just beyond the takeoff of the right PICA. Posterior inferior cerebral arteries patent bilaterally. Basilar artery diminutive but widely patent to its distal aspect. Superior cerebral arteries patent bilaterally. Hypoplastic P1 segments with predominant fetal type origin of the PCAs. Moderate to advanced multifocal atheromatous irregularity throughout the PCAs bilaterally with associated moderate to severe multifocal stenoses. Venous sinuses: Patent. Anatomic variants: Fetal type origin of the PCAs.  No aneurysm. Delayed phase: No abnormal enhancement. Review of the MIP images confirms the above findings IMPRESSION: 1. Negative CTA for large vessel occlusion. No other acute vascular abnormality within the head and neck. 2. Moderate intracranial atherosclerotic change, most notable within the bilateral PCAs and distal MCA branches. No other proximal high-grade or correctable stenosis identified. 3. 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 Left vertebral artery dominant. Predominant fetal type origin of the PCAs with overall diminutive  vertebrobasilar system. 4. Atheromatous plaque about the right carotid bifurcation with associated stenosis of up to approximately 40% by NASCET criteria. Electronically Signed   By: Jeannine Boga M.D.   On: 07/09/2018 21:44   Ct Angio Neck W And/or Wo Contrast  Result Date: 07/09/2018 CLINICAL DATA:  Initial evaluation for neck pain with extremity weakness. EXAM: CT ANGIOGRAPHY HEAD AND NECK TECHNIQUE: Multidetector CT imaging of the head and neck was performed using the standard protocol during bolus administration of intravenous contrast. Multiplanar CT image reconstructions and MIPs were obtained to evaluate the vascular anatomy. Carotid stenosis measurements (when applicable) are obtained utilizing NASCET criteria, using the distal internal carotid diameter as the denominator. CONTRAST:  <See Chart> ISOVUE-370 IOPAMIDOL (ISOVUE-370) INJECTION 76% COMPARISON:  Prior CT from 05/22/2017. FINDINGS: CT HEAD FINDINGS Brain: Age-related cerebral atrophy with mild chronic small vessel ischemic disease. No acute intracranial hemorrhage. No acute large vessel territory infarct. No mass lesion, midline shift or mass effect. No hydrocephalus. No extra-axial fluid collection. Vascular: No hyperdense vessel. Skull: Scalp soft tissues and calvarium within normal limits. Sinuses: Paranasal sinuses are clear. Trace opacity right mastoid air cells noted, of doubtful significance. Orbits: Globes and orbital soft tissues within normal limits. Review of the MIP images confirms the above findings CTA NECK FINDINGS Aortic arch: Visualized aortic arch of normal caliber with normal 3 vessel morphology. Sequelae of prior CABG partially visualized. Mild atheromatous plaque within the arch and about the origin of the great vessels without hemodynamically significant stenosis. Visualized subclavian arteries widely patent. Right carotid system: Right common carotid  artery patent from its origin to the bifurcation without  flow-limiting stenosis. Mild atheromatous irregularity about the right bifurcation with associated stenosis of up to 40% by NASCET criteria. Right ICA widely patent distally to the skull base without stenosis, dissection, or occlusion. Left carotid system: Soft atheromatous plaque within the right common carotid artery with secondary mild diffuse stenosis. Minimal calcified plaque about the left bifurcation without significant narrowing. Left ICA widely patent from the bifurcation to the skull base without stenosis, dissection, or occlusion. Vertebral arteries: Both of the vertebral arteries arise from the subclavian arteries. Focal plaque at the origin of the vertebral arteries bilaterally with associated moderate approximate 50% stenosis on the left and fairly severe stenosis on the right. Left vertebral artery dominant. Vertebral arteries otherwise widely patent within the neck without stenosis, dissection, or occlusion. Skeleton: No acute osseous abnormality. No discrete lytic or blastic osseous lesions. Other neck: No acute soft tissue abnormality within the neck. Few scattered subcentimeter hypodense thyroid nodules noted, of doubtful significance given size and patient age. Upper chest: Visualized upper chest demonstrates no acute finding. Partially visualized lungs are grossly clear. Review of the MIP images confirms the above findings CTA HEAD FINDINGS Anterior circulation: Petrous segments widely patent bilaterally. Mild atheromatous plaque within the cavernous/supraclinoid ICAs without hemodynamically significant stenosis. ICA termini widely patent. A1 segments, anterior communicating artery common anterior cerebral arteries widely patent bilaterally. M1 segments widely patent without stenosis. No proximal M2 occlusion. Distal MCA branches well perfused and symmetric. Distal small vessel atheromatous irregularity noted. Posterior circulation: Mild atheromatous irregularity within the dominant left  vertebral artery without hemodynamically significant stenosis. Focal moderate distal right V4 segment just beyond the takeoff of the right PICA. Posterior inferior cerebral arteries patent bilaterally. Basilar artery diminutive but widely patent to its distal aspect. Superior cerebral arteries patent bilaterally. Hypoplastic P1 segments with predominant fetal type origin of the PCAs. Moderate to advanced multifocal atheromatous irregularity throughout the PCAs bilaterally with associated moderate to severe multifocal stenoses. Venous sinuses: Patent. Anatomic variants: Fetal type origin of the PCAs.  No aneurysm. Delayed phase: No abnormal enhancement. Review of the MIP images confirms the above findings IMPRESSION: 1. Negative CTA for large vessel occlusion. No other acute vascular abnormality within the head and neck. 2. Moderate intracranial atherosclerotic change, most notable within the bilateral PCAs and distal MCA branches. No other proximal high-grade or correctable stenosis identified. 3. 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 Left vertebral artery dominant. Predominant fetal type origin of the PCAs with overall diminutive vertebrobasilar system. 4. Atheromatous plaque about the right carotid bifurcation with associated stenosis of up to approximately 40% by NASCET criteria. Electronically Signed   By: Jeannine Boga M.D.   On: 07/09/2018 21:44   Mr Brain W And Wo Contrast  Result Date: 07/10/2018 CLINICAL DATA:  77 y/o M; chief complaint of neck pain with bilateral upper and lower extremity weakness. EXAM: MRI HEAD WITHOUT AND WITH CONTRAST MRI CERVICAL SPINE WITHOUT AND WITH CONTRAST TECHNIQUE: Multiplanar, multiecho pulse sequences of the brain and surrounding structures, and cervical spine, to include the craniocervical junction and cervicothoracic junction, were obtained without and with intravenous contrast.  CONTRAST:  10 cc Gadavist. COMPARISON:  07/09/2018 CTA head and neck. FINDINGS: MRI HEAD FINDINGS Brain: No acute infarction, hemorrhage, hydrocephalus, extra-axial collection or mass lesion. Few nonspecific T2 FLAIR hyperintensities in subcortical and periventricular white matter are compatible with mild chronic microvascular ischemic changes for  age. Mild volume loss of the brain. No abnormal enhancement. Vascular: Normal flow voids. Skull and upper cervical spine: Normal marrow signal. Sinuses/Orbits: Mild maxillary sinus mucosal thickening and partial opacification of mastoid air cells. Bilateral intra-ocular lens replacement. Other: None. MRI CERVICAL SPINE FINDINGS Alignment: Physiologic. Vertebrae: Mild right-sided C5-6 facet edema and small facet effusions at the left-sided C3-4 and C4-5 levels, likely degenerative. No findings of fracture or discitis. Cord: No abnormal cord signal.  No abnormal enhancement. Posterior Fossa, vertebral arteries, paraspinal tissues: Negative. Disc levels: C2-3: Right greater than left uncovertebral and facet hypertrophy. Mild right foraminal stenosis. No canal stenosis. C3-4: Disc osteophyte complex with bilateral uncovertebral and facet hypertrophy. Moderate bilateral foraminal and moderate canal stenosis. C4-5: Disc osteophyte complex eccentric with bilateral uncovertebral and facet hypertrophy. Severe bilateral foraminal stenosis. Moderate canal stenosis and right anterior cord impingement cord flattening. C5-6: Disc osteophyte complex with bilateral uncovertebral and facet hypertrophy. Severe bilateral foraminal stenosis and moderate canal stenosis. C6-7: Disc osteophyte complex with left central disc protrusion. Moderate canal stenosis with left central anterior cord impingement. No significant foraminal stenosis. C7-T1: Small disc bulge and facet hypertrophy. Mild canal stenosis. No significant foraminal stenosis. IMPRESSION: MRI head: 1. No acute intracranial process or  abnormal enhancement. 2. Mild chronic microvascular ischemic changes and volume loss of the brain. MRI cervical spine: 1. Mild right C5-6 as well as left C3-4 and C4-5 facet effusions, likely degenerative. 2. Advanced cervical spondylosis with disc and facet degenerative changes greatest at the C3-4 through C6-7 levels. 3. Moderate C3-4 through C6-7 canal stenosis. Right anterior C4-5 and left anterior C6-7 cord impingement. No abnormal cord signal. 4. Severe C4-5 and C5-6 bilateral foraminal stenosis. Multilevel mild and moderate foraminal stenosis. Electronically Signed   By: Kristine Garbe M.D.   On: 07/10/2018 00:56   Mr Cervical Spine W Or Wo Contrast  Result Date: 07/10/2018 CLINICAL DATA:  77 y/o M; chief complaint of neck pain with bilateral upper and lower extremity weakness. EXAM: MRI HEAD WITHOUT AND WITH CONTRAST MRI CERVICAL SPINE WITHOUT AND WITH CONTRAST TECHNIQUE: Multiplanar, multiecho pulse sequences of the brain and surrounding structures, and cervical spine, to include the craniocervical junction and cervicothoracic junction, were obtained without and with intravenous contrast. CONTRAST:  10 cc Gadavist. COMPARISON:  07/09/2018 CTA head and neck. FINDINGS: MRI HEAD FINDINGS Brain: No acute infarction, hemorrhage, hydrocephalus, extra-axial collection or mass lesion. Few nonspecific T2 FLAIR hyperintensities in subcortical and periventricular white matter are compatible with mild chronic microvascular ischemic changes for age. Mild volume loss of the brain. No abnormal enhancement. Vascular: Normal flow voids. Skull and upper cervical spine: Normal marrow signal. Sinuses/Orbits: Mild maxillary sinus mucosal thickening and partial opacification of mastoid air cells. Bilateral intra-ocular lens replacement. Other: None. MRI CERVICAL SPINE FINDINGS Alignment: Physiologic. Vertebrae: Mild right-sided C5-6 facet edema and small facet effusions at the left-sided C3-4 and C4-5 levels,  likely degenerative. No findings of fracture or discitis. Cord: No abnormal cord signal.  No abnormal enhancement. Posterior Fossa, vertebral arteries, paraspinal tissues: Negative. Disc levels: C2-3: Right greater than left uncovertebral and facet hypertrophy. Mild right foraminal stenosis. No canal stenosis. C3-4: Disc osteophyte complex with bilateral uncovertebral and facet hypertrophy. Moderate bilateral foraminal and moderate canal stenosis. C4-5: Disc osteophyte complex eccentric with bilateral uncovertebral and facet hypertrophy. Severe bilateral foraminal stenosis. Moderate canal stenosis and right anterior cord impingement cord flattening. C5-6: Disc osteophyte complex with bilateral uncovertebral and facet hypertrophy. Severe bilateral foraminal stenosis and moderate canal stenosis. C6-7: Disc osteophyte  complex with left central disc protrusion. Moderate canal stenosis with left central anterior cord impingement. No significant foraminal stenosis. C7-T1: Small disc bulge and facet hypertrophy. Mild canal stenosis. No significant foraminal stenosis. IMPRESSION: MRI head: 1. No acute intracranial process or abnormal enhancement. 2. Mild chronic microvascular ischemic changes and volume loss of the brain. MRI cervical spine: 1. Mild right C5-6 as well as left C3-4 and C4-5 facet effusions, likely degenerative. 2. Advanced cervical spondylosis with disc and facet degenerative changes greatest at the C3-4 through C6-7 levels. 3. Moderate C3-4 through C6-7 canal stenosis. Right anterior C4-5 and left anterior C6-7 cord impingement. No abnormal cord signal. 4. Severe C4-5 and C5-6 bilateral foraminal stenosis. Multilevel mild and moderate foraminal stenosis. Electronically Signed   By: Kristine Garbe M.D.   On: 07/10/2018 00:56        Scheduled Meds: . amLODipine  10 mg Oral Daily  . dicyclomine  10 mg Oral BID  . gabapentin  300 mg Oral BID  . hydrALAZINE  50 mg Oral TID  . insulin aspart   0-9 Units Subcutaneous Q4H  . insulin detemir  40 Units Subcutaneous BID  . pantoprazole  40 mg Oral Daily  . primidone  50 mg Oral BID  . propranolol  40 mg Oral TID  . [START ON 07/12/2018] rosuvastatin  20 mg Oral Once per day on Mon Thu  . sodium chloride flush  3 mL Intravenous Q12H  . tamsulosin  0.4 mg Oral Daily   Continuous Infusions: . sodium chloride       LOS: 1 day    Time spent: 35 minutes.     Hosie Poisson, MD [facility] Pager [phone]   If 7PM-7AM, please contact night-coverage [email or web address] Password [facility] 07/11/2018, 4:04 PM

## 2018-07-11 NOTE — Evaluation (Signed)
Physical Therapy Evaluation  Patient Details Name: Edward [name] MRN: [id number] DOB: July 18, 1941 Today's Date: 07/11/2018   History of Present Illness  Edward [name] is a right-handed 77 y.o. male with a history of CAD s/p CABG, chronic HFpEF, IDDM, OA s/p multiple joint replacements who presented to the ED for 1 week of worsening, constant, severe L>R neck pain with associated numbness and weakness of the right upper extremity. CTA head and neck ruled out vascular occlusion and MRI brain, cervical spine demonstrated advanced cervical spondylosis with cord compression. Neurosurgery was consulted, recommending medical admission. Cervical decompression sx planned 9/16.   Clinical Impression  Pt pleasant on arrival, but reporting 10/10 pain after receiving pain meds. Pt fully independent prior, witting a book on his travels to [place]. Pt able to demonstrate modified independence with bed mobility, transfers, and ambulation. Pt encouraged to use RW at home for now for increased stability and to decrease strain at neck from guarding. Pt with decreased ROM, strength in RUE, and decreased mobility secondary to pain. Pt cervical sx planned for 9/16. Pt is able to function similar to baseline except for increased pain, pt would benefit from acute skilled therapy pre-op to maximize balance and activity tolerance.    Follow Up Recommendations No PT follow up(will assess again post-op)    Equipment Recommendations  None recommended by PT    Recommendations for Other Services       Precautions / Restrictions Precautions Precautions: Fall Restrictions Weight Bearing Restrictions: No      Mobility  Bed Mobility Overal bed mobility: Needs Assistance Bed Mobility: Rolling;Sidelying to Sit Rolling: Min assist Sidelying to sit: Modified independent (Device/Increase time)       General bed mobility comments: min assist in rolling secondary to pain and self-limiting. verbal cues for sequencing and  initiation, bend knee, reach for rail, push up off elbow. pt taught this technique in preparation for upcoming surgery and to limit neck rotation.  increased time for trunk elevation from sidelying to sit.   Transfers Overall transfer level: Needs assistance Equipment used: None Transfers: Sit to/from Stand Sit to Stand: Supervision         General transfer comment: supervision for safety. two trials sit to stand from bed with supervision, additional trial to and from toilet independent.   Ambulation/Gait Ambulation/Gait assistance: Supervision Gait Distance (Feet): 200 Feet Assistive device: Rolling walker (2 wheeled) Gait Pattern/deviations: Step-through pattern;Trunk flexed;Decreased stride length;Trendelenburg Gait velocity: decreased    General Gait Details: able to walk 30 ft without AD, bil trendelenburg noted. increased steadiness with use of RW for remaining ambulation. verbal cues for posture inside walker vx. posterior to RW. pt encouraged to continue using RW for additional support and to decrease straining at neck.   Stairs            Wheelchair Mobility    Modified Rankin (Stroke Patients Only)       Balance Overall balance assessment: Modified Independent                                           Pertinent Vitals/Pain Pain Assessment: 0-10 Pain Score: 10-Worst pain ever Pain Location: neck. RUE  Pain Descriptors / Indicators: Constant;Grimacing;Radiating;Numbness Pain Intervention(s): Limited activity within patient's tolerance;Monitored during session;Premedicated before session;Repositioned    Home Living Family/patient expects to be discharged to:: Private residence Living Arrangements: Alone Available Help at Discharge:  Family;Available PRN/intermittently Type of Home: House Home Access: Stairs to enter Entrance Stairs-Rails: Right Entrance Stairs-Number of Steps: 3 steps Home Layout: One level Home Equipment: Walker - 4  wheels;Cane - single point;Shower seat(seat for step-in tub, does not have seat for shower. ) Additional Comments: Retired brother can assist    Prior Function Level of Independence: Independent         Comments: Still driving; enjoys church; has a cane, does not use; house cleaner comes twice a month      Hand Dominance   Dominant Hand: Right    Extremity/Trunk Assessment   Upper Extremity Assessment Upper Extremity Assessment: Defer to OT evaluation;Overall WFL for tasks assessed    Lower Extremity Assessment Lower Extremity Assessment: Overall WFL for tasks assessed;Generalized weakness    Cervical / Trunk Assessment Cervical / Trunk Assessment: Kyphotic  Communication   Communication: No difficulties  Cognition Arousal/Alertness: Awake/alert Behavior During Therapy: WFL for tasks assessed/performed Overall Cognitive Status: Within Functional Limits for tasks assessed                                        General Comments General comments (skin integrity, edema, etc.): sitting balance feet supported able to maintain without UE support. standing balance fair, able to stand without bil UE support, but increased steadniess with use of RW.     Exercises     Assessment/Plan    PT Assessment Patient needs continued PT services  PT Problem List Decreased strength;Decreased knowledge of use of DME;Decreased range of motion;Decreased mobility;Decreased balance;Decreased activity tolerance;Decreased coordination       PT Treatment Interventions DME instruction;Therapeutic exercise;Gait training;Balance training;Stair training;Functional mobility training;Therapeutic activities;Patient/family education    PT Goals (Current goals can be found in the Care Plan section)  Acute Rehab PT Goals Patient Stated Goal: go home, be without pain  PT Goal Formulation: With patient Time For Goal Achievement: 07/25/18 Potential to Achieve Goals: Fair    Frequency  Min 3X/week   Barriers to discharge        Co-evaluation               AM-PAC PT "6 Clicks" Daily Activity  Outcome Measure Difficulty turning over in bed (including adjusting bedclothes, sheets and blankets)?: A Little Difficulty moving from lying on back to sitting on the side of the bed? : A Lot Difficulty sitting down on and standing up from a chair with arms (e.g., wheelchair, bedside commode, etc,.)?: A Little Help needed moving to and from a bed to chair (including a wheelchair)?: None Help needed walking in hospital room?: A Little Help needed climbing 3-5 steps with a railing? : A Little 6 Click Score: 18    End of Session Equipment Utilized During Treatment: Gait belt Activity Tolerance: Patient tolerated treatment well Patient left: in chair;with call bell/phone within reach;with nursing/sitter in room Nurse Communication: Mobility status PT Visit Diagnosis: Other abnormalities of gait and mobility (R26.89);Muscle weakness (generalized) (M62.81)    Time: 5093-2671 PT Time Calculation (min) (ACUTE ONLY): 27 min   Charges:   PT Evaluation $PT Eval Low Complexity: 1 Low PT Treatments $Gait Training: 8-22 mins        Samuella Bruin, [place]  Acute Rehab [phone]   Samuella Bruin 07/11/2018, 1:21 PM

## 2018-07-11 NOTE — Evaluation (Signed)
Occupational Therapy Evaluation Patient Details Name: Edward [name] MRN: [id number] DOB: 1941/09/15 Today's Date: 07/11/2018    History of Present Illness LADARIOUS [name] is a right-handed 77 y.o. male with a history of CAD s/p CABG, chronic HFpEF, IDDM, OA s/p multiple joint replacements who presented to the ED for 1 week of worsening, constant, severe L>R neck pain with associated numbness and weakness of the right upper extremity. CTA head and neck ruled out vascular occlusion and MRI brain, cervical spine demonstrated advanced cervical spondylosis with cord compression. Neurosurgery was consulted, recommending medical admission.   Clinical Impression   PATIENT WAS SEEN FOR SKILLED OT TO ASSESS ABILITY WITH ADLS AND ADL MOBILITY. PATIENT EVALUATION WAS LIMITED BY PNT 10/10 PAIN AND BLOOD SUGAR OVER 600. PATIENT WILL NEED FURTHER OT TO INCREASE PNT I AND SAFETY WITH ADLS AND ADL TRANSFERS. PATIENT IS HAVING DIFFICULTY WITH LE ADLS AND NEEDS TO BE FURTHER EDUCATED ON USE OF AE FOR LE ADLS. PATIENT HAS USED IN THE PAST AND WANTS FURTHER TRAINING.     Follow Up Recommendations       Equipment Recommendations  None recommended by OT    Recommendations for Other Services       Precautions / Restrictions Precautions Precautions: Fall      Mobility Bed Mobility Overal bed mobility: Modified Independent                Transfers Overall transfer level: Needs assistance   Transfers: Sit to/from Stand Sit to Stand: Supervision              Balance                                           ADL either performed or assessed with clinical judgement   ADL Overall ADL's : Needs assistance/impaired Eating/Feeding: Independent   Grooming: Wash/dry hands;Wash/dry face;Supervision/safety   Upper Body Bathing: Supervision/ safety;Set up   Lower Body Bathing: Supervison/ safety;Set up   Upper Body Dressing : Set up   Lower Body Dressing: Minimal  assistance Lower Body Dressing Details (indicate cue type and reason): PATIENT STATES HE USES A STOOL AT HOME TO ASSIST WITH DONNING SOCKS. Toilet Transfer: Warehouse manager and Hygiene: Supervision/safety       Functional mobility during ADLs: (S FOR TRANSFERS. UNABALE TO AMB SECONDARY TO HIGH BLOOD SUGA) General ADL Comments: DISCUSSED WITH PATIENT THAT HE MAY BENEFIT FROM USE OF AE FOR LE ADLS. PATIIENT STATES HE HAS USED THEM BEFORE AT THE [facility]. PATIENT STATES HE WILL LOOK INTO PURCHASING.      Vision Baseline Vision/History: Wears glasses Wears Glasses: Reading only Vision Assessment?: No apparent visual deficits     Perception     Praxis      Pertinent Vitals/Pain Pain Assessment: 0-10 Pain Score: 10-Worst pain ever Pain Location: NECK Pain Descriptors / Indicators: Constant Pain Intervention(s): Monitored during session;Premedicated before session     Hand Dominance Right   Extremity/Trunk Assessment Upper Extremity Assessment Upper Extremity Assessment: RUE deficits/detail;LUE deficits/detail RUE Deficits / Details: R SHLD LIMITED FROM OA  TO 45 DEGREES AROM AND AAROM 90 DEGREES. DISTAL ROM IS WNL RUE Sensation: decreased light touch LUE Deficits / Details: L UE SHLD LIMITED BY OA AND NERVE PAIN. AROM/AAROM 45 DEGREES AND DISTAL ROM IS WNL.(L UE SHLD LIMITED BY OA AND NERVE PAIN. AROM/AAROM 45 DEGREE)  LUE Sensation: decreased light touch           Communication Communication Communication: No difficulties   Cognition Arousal/Alertness: Awake/alert Behavior During Therapy: WFL for tasks assessed/performed Overall Cognitive Status: Within Functional Limits for tasks assessed                                     General Comments       Exercises     Shoulder Instructions      Home Living Family/patient expects to be discharged to:: Private residence Living Arrangements: Alone Available Help at  Discharge: Family;Available PRN/intermittently Type of Home: House Home Access: Stairs to enter [employer] of Steps: 3 steps Entrance Stairs-Rails: None Home Layout: One level     Bathroom Shower/Tub: Teacher, early years/pre: Standard     Home Equipment: Environmental consultant - 4 wheels;Cane - single point;Shower seat   Additional Comments: PATIENT HAS BROTHER THAT LIVES CLOSE THAT CAN ASSIST PRN      Prior Functioning/Environment Level of Independence: Independent        Comments: PATIENT HAS A CANE BUT DOES NOT USE OFTEN. PNT HAS SOMEONE THAT CLEANS HOUSE ONCE A MONTH FOR HEAVY CLEANING.        OT Problem List: Decreased knowledge of use of DME or AE;Pain      OT Treatment/Interventions:      OT Goals(Current goals can be found in the care plan section) Acute Rehab OT Goals Patient Stated Goal: (TO GO HOME) OT Goal Formulation: With patient Time For Goal Achievement: 07/25/18 Potential to Achieve Goals: Good  OT Frequency: Min 2X/week   Barriers to D/C:            Co-evaluation              AM-PAC PT "6 Clicks" Daily Activity     Outcome Measure Help from another person eating meals?: None Help from another person taking care of personal grooming?: A Little Help from another person toileting, which includes using toliet, bedpan, or urinal?: A Little Help from another person bathing (including washing, rinsing, drying)?: A Little Help from another person to put on and taking off regular upper body clothing?: A Little Help from another person to put on and taking off regular lower body clothing?: A Little 6 Click Score: 19   End of Session Nurse Communication: Patient requests pain meds(NOTIFIED NURSE THAT PATIENT WANTED BLOOD SUGAR TESTED )  Activity Tolerance: No increased pain;Treatment limited secondary to medical complications (Comment)(HIGH BLOOD SUGAR GREATER THAN 600) Patient left: in bed;with call bell/phone within reach;with bed alarm  set  OT Visit Diagnosis: Pain                Time: 0935-1010 OT Time Calculation (min): 35 min Charges:  OT General Charges $OT Visit: 1 Visit OT Evaluation $OT Eval Low Complexity: 1 Low OT Treatments $Self Care/Home Management : 0-93 mins  6 CLICKS.  Parry Po 07/11/2018, 11:25 AM

## 2018-07-12 LAB — GLUCOSE, CAPILLARY
Glucose-Capillary: 148 mg/dL — ABNORMAL HIGH (ref 70–99)
Glucose-Capillary: 260 mg/dL — ABNORMAL HIGH (ref 70–99)

## 2018-07-12 MED ORDER — SENNOSIDES-DOCUSATE SODIUM 8.6-50 MG PO TABS: 2.0000 | ORAL_TABLET | Freq: Two times a day (BID) | ORAL | 0 refills | Status: DC

## 2018-07-12 MED ORDER — POLYETHYLENE GLYCOL 3350 17 G PO PACK: 17.0000 g | PACK | Freq: Every day | ORAL | 0 refills | Status: DC

## 2018-07-12 MED ORDER — POLYETHYLENE GLYCOL 3350 17 G PO PACK: 17.0000 g | PACK | Freq: Every day | ORAL | Status: DC

## 2018-07-12 MED ORDER — SENNOSIDES-DOCUSATE SODIUM 8.6-50 MG PO TABS: 2.0000 | ORAL_TABLET | Freq: Two times a day (BID) | ORAL | Status: DC

## 2018-07-12 NOTE — Progress Notes (Signed)
Complains of constipation, refuses senokot, patient would prefer to get home before he takes any laxatives.

## 2018-07-12 NOTE — Progress Notes (Signed)
Patient discharged to home, AVS reviewed, printed prescription was provided. Patient left floor via wheelchair with volunteer

## 2018-07-12 NOTE — Progress Notes (Signed)
Occupational Therapy Treatment Patient Details Name: Edward [name] MRN: [id number] DOB: [birth year] Today's Date: 07/12/2018    History of present illness Edward [name] is a right-handed 77 y.o. male with a history of CAD s/p CABG, chronic HFpEF, IDDM, OA s/p multiple joint replacements who presented to the ED for 1 week of worsening, constant, severe L>R neck pain with associated numbness and weakness of the right upper extremity. CTA head and neck ruled out vascular occlusion and MRI brain, cervical spine demonstrated advanced cervical spondylosis with cord compression. Neurosurgery was consulted, recommending medical admission. Cervical decompression sx planned 9/16.    OT comments  Pt seen for ADL retraining session today with focus on LB ADL's w/ A/E and functional transfers. Issued wide sock aid which appears to allow for Mod I. Pt will use LH reacher at home that he already has. Overall Mod independent and states that he is awaiting surgery on 07/16/18.   Follow Up Recommendations  No OT follow up    Equipment Recommendations  Other (comment)(Issued Wide Sock Aid)    Recommendations for Other Services      Precautions / Restrictions Precautions Precautions: Fall Restrictions Weight Bearing Restrictions: No       Mobility Bed Mobility Overal bed mobility: (Pt up in chair upon OT arrival)                Transfers Overall transfer level: Modified independent   Transfers: Sit to/from Stand;Stand Pivot Transfers Sit to Stand: Modified independent (Device/Increase time) Stand pivot transfers: Modified independent (Device/Increase time)            Balance Overall balance assessment: Modified Independent;No apparent balance deficits (not formally assessed)                                         ADL either performed or assessed with clinical judgement   ADL Overall ADL's : Needs assistance/impaired Eating/Feeding: Independent   Grooming:  Wash/dry hands;Wash/dry face;Oral care;Modified independent;Sitting;Standing Grooming Details (indicate cue type and reason): Performed grooming sitting and standing at sink Upper Body Bathing: Modified independent;Sitting   Lower Body Bathing: Supervison/ safety;Set up;Sitting/lateral leans;Sit to/from stand Lower Body Bathing Details (indicate cue type and reason): Discussed possible use of LH Sponge or sitting at home to bathe LB Upper Body Dressing : Set up   Lower Body Dressing: Modified independent;With adaptive equipment;Sit to/from stand Lower Body Dressing Details (indicate cue type and reason): Pt has used a stool at home in past to assist in donning socks. Wide sock aid issued today and pt Mod I donning socks using this. Toilet Transfer: Modified Independent;Ambulation Toilet Transfer Details (indicate cue type and reason): Pt states toilet at home is comfort height Toileting- Clothing Manipulation and Hygiene: Modified independent;Sitting/lateral lean;Sit to/from stand       Functional mobility during ADLs: Modified independent General ADL Comments: OT ADL Goals written and placed in chart today. Pt participated in ADL retraining session with focus on LB ADL's, functional mobiity and toilet transfers  and use of A/E for increased independence. Issued wide sock aid as pt will be d/c home today. He has [employer] reacher that he will use in conjunction with sock aid.     Vision Baseline Vision/History: Wears glasses Wears Glasses: Reading only Vision Assessment?: No apparent visual deficits   Perception     Praxis      Cognition Arousal/Alertness: Awake/alert Behavior During  Therapy: WFL for tasks assessed/performed Overall Cognitive Status: Within Functional Limits for tasks assessed                                          Exercises     Shoulder Instructions       General Comments      Pertinent Vitals/ Pain       Pain Assessment: Faces Faces Pain  Scale: Hurts little more Pain Location: neck. RUE  Pain Descriptors / Indicators: Grimacing;Numbness;Radiating Pain Intervention(s): Limited activity within patient's tolerance;Monitored during session  Home Living                                          Prior Functioning/Environment              Frequency  Min 2X/week        Progress Toward Goals  OT Goals(current goals can now be found in the care plan section)  Progress towards OT goals: Progressing toward goals  Acute Rehab OT Goals Patient Stated Goal: Go home later today OT Goal Formulation: With patient Time For Goal Achievement: 07/25/18 Potential to Achieve Goals: Good  Plan Discharge plan remains appropriate    Co-evaluation                 AM-PAC PT "6 Clicks" Daily Activity     Outcome Measure   Help from another person eating meals?: None Help from another person taking care of personal grooming?: None Help from another person toileting, which includes using toliet, bedpan, or urinal?: None Help from another person bathing (including washing, rinsing, drying)?: None Help from another person to put on and taking off regular upper body clothing?: None Help from another person to put on and taking off regular lower body clothing?: A Little(Mod I with A/E) 6 Click Score: 23    End of Session    OT Visit Diagnosis: Pain Pain - part of body: (Cervical neck)   Activity Tolerance Patient tolerated treatment well;No increased pain   Patient Left in chair;with call bell/phone within reach   Nurse Communication          Time: 1572-6203 OT Time Calculation (min): 25 min  Charges: OT General Charges $OT Visit: 1 Visit OT Treatments $Self Care/Home Management : 23-37 mins    Almyra Deforest, OTR/L 07/12/2018, 9:52 AM

## 2018-07-13 ENCOUNTER — Inpatient Hospital Stay (HOSPITAL_COMMUNITY)
Admission: EM | Admit: 2018-07-13 | Discharge: 2018-08-07 | DRG: 471 | Disposition: A | Discharge status: Other Acute Inpatient Hospital | Payer: Medicare Other | Attending: Internal Medicine | Admitting: Internal Medicine

## 2018-07-13 ENCOUNTER — Emergency Department (HOSPITAL_COMMUNITY): Payer: Medicare Other

## 2018-07-13 ENCOUNTER — Encounter (HOSPITAL_COMMUNITY): Payer: Self-pay | Admitting: *Deleted

## 2018-07-13 ENCOUNTER — Other Ambulatory Visit: Payer: Self-pay

## 2018-07-13 DIAGNOSIS — J9690 Respiratory failure, unspecified, unspecified whether with hypoxia or hypercapnia: Secondary | ICD-10-CM

## 2018-07-13 DIAGNOSIS — I119 Hypertensive heart disease without heart failure: Secondary | ICD-10-CM | POA: Diagnosis not present

## 2018-07-13 DIAGNOSIS — Z9911 Dependence on respirator [ventilator] status: Secondary | ICD-10-CM

## 2018-07-13 DIAGNOSIS — J9602 Acute respiratory failure with hypercapnia: Secondary | ICD-10-CM | POA: Diagnosis not present

## 2018-07-13 DIAGNOSIS — I440 Atrioventricular block, first degree: Secondary | ICD-10-CM | POA: Diagnosis present

## 2018-07-13 DIAGNOSIS — R402143 Coma scale, eyes open, spontaneous, at hospital admission: Secondary | ICD-10-CM | POA: Diagnosis present

## 2018-07-13 DIAGNOSIS — E870 Hyperosmolality and hypernatremia: Secondary | ICD-10-CM | POA: Diagnosis not present

## 2018-07-13 DIAGNOSIS — R490 Dysphonia: Secondary | ICD-10-CM | POA: Diagnosis present

## 2018-07-13 DIAGNOSIS — G931 Anoxic brain damage, not elsewhere classified: Secondary | ICD-10-CM | POA: Diagnosis not present

## 2018-07-13 DIAGNOSIS — E785 Hyperlipidemia, unspecified: Secondary | ICD-10-CM | POA: Diagnosis not present

## 2018-07-13 DIAGNOSIS — J9600 Acute respiratory failure, unspecified whether with hypoxia or hypercapnia: Secondary | ICD-10-CM

## 2018-07-13 DIAGNOSIS — Y92230 Patient room in hospital as the place of occurrence of the external cause: Secondary | ICD-10-CM | POA: Diagnosis not present

## 2018-07-13 DIAGNOSIS — Z86010 Personal history of colonic polyps: Secondary | ICD-10-CM

## 2018-07-13 DIAGNOSIS — I469 Cardiac arrest, cause unspecified: Secondary | ICD-10-CM | POA: Diagnosis present

## 2018-07-13 DIAGNOSIS — R9401 Abnormal electroencephalogram [EEG]: Secondary | ICD-10-CM | POA: Diagnosis not present

## 2018-07-13 DIAGNOSIS — T380X5A Adverse effect of glucocorticoids and synthetic analogues, initial encounter: Secondary | ICD-10-CM | POA: Diagnosis not present

## 2018-07-13 DIAGNOSIS — Y838 Other surgical procedures as the cause of abnormal reaction of the patient, or of later complication, without mention of misadventure at the time of the procedure: Secondary | ICD-10-CM | POA: Diagnosis not present

## 2018-07-13 DIAGNOSIS — R197 Diarrhea, unspecified: Secondary | ICD-10-CM | POA: Diagnosis not present

## 2018-07-13 DIAGNOSIS — Z9289 Personal history of other medical treatment: Secondary | ICD-10-CM

## 2018-07-13 DIAGNOSIS — R402363 Coma scale, best motor response, obeys commands, at hospital admission: Secondary | ICD-10-CM | POA: Diagnosis present

## 2018-07-13 DIAGNOSIS — G8220 Paraplegia, unspecified: Secondary | ICD-10-CM | POA: Diagnosis not present

## 2018-07-13 DIAGNOSIS — Z781 Physical restraint status: Secondary | ICD-10-CM

## 2018-07-13 DIAGNOSIS — Z7982 Long term (current) use of aspirin: Secondary | ICD-10-CM

## 2018-07-13 DIAGNOSIS — R627 Adult failure to thrive: Secondary | ICD-10-CM | POA: Diagnosis present

## 2018-07-13 DIAGNOSIS — N183 Chronic kidney disease, stage 3 unspecified: Secondary | ICD-10-CM | POA: Diagnosis present

## 2018-07-13 DIAGNOSIS — Z7189 Other specified counseling: Secondary | ICD-10-CM

## 2018-07-13 DIAGNOSIS — G992 Myelopathy in diseases classified elsewhere: Secondary | ICD-10-CM | POA: Diagnosis not present

## 2018-07-13 DIAGNOSIS — G4733 Obstructive sleep apnea (adult) (pediatric): Secondary | ICD-10-CM | POA: Diagnosis not present

## 2018-07-13 DIAGNOSIS — K219 Gastro-esophageal reflux disease without esophagitis: Secondary | ICD-10-CM | POA: Diagnosis present

## 2018-07-13 DIAGNOSIS — Z66 Do not resuscitate: Secondary | ICD-10-CM | POA: Diagnosis not present

## 2018-07-13 DIAGNOSIS — R262 Difficulty in walking, not elsewhere classified: Secondary | ICD-10-CM | POA: Diagnosis present

## 2018-07-13 DIAGNOSIS — G250 Essential tremor: Secondary | ICD-10-CM | POA: Diagnosis not present

## 2018-07-13 DIAGNOSIS — R1312 Dysphagia, oropharyngeal phase: Secondary | ICD-10-CM | POA: Diagnosis not present

## 2018-07-13 DIAGNOSIS — Z794 Long term (current) use of insulin: Secondary | ICD-10-CM

## 2018-07-13 DIAGNOSIS — Z951 Presence of aortocoronary bypass graft: Secondary | ICD-10-CM

## 2018-07-13 DIAGNOSIS — Z6841 Body Mass Index (BMI) 40.0 and over, adult: Secondary | ICD-10-CM | POA: Diagnosis not present

## 2018-07-13 DIAGNOSIS — Z5329 Procedure and treatment not carried out because of patient's decision for other reasons: Secondary | ICD-10-CM | POA: Diagnosis not present

## 2018-07-13 DIAGNOSIS — E1159 Type 2 diabetes mellitus with other circulatory complications: Secondary | ICD-10-CM | POA: Diagnosis not present

## 2018-07-13 DIAGNOSIS — J690 Pneumonitis due to inhalation of food and vomit: Secondary | ICD-10-CM | POA: Diagnosis not present

## 2018-07-13 DIAGNOSIS — M542 Cervicalgia: Secondary | ICD-10-CM

## 2018-07-13 DIAGNOSIS — Z96653 Presence of artificial knee joint, bilateral: Secondary | ICD-10-CM | POA: Diagnosis present

## 2018-07-13 DIAGNOSIS — I130 Hypertensive heart and chronic kidney disease with heart failure and stage 1 through stage 4 chronic kidney disease, or unspecified chronic kidney disease: Secondary | ICD-10-CM | POA: Diagnosis not present

## 2018-07-13 DIAGNOSIS — I5030 Unspecified diastolic (congestive) heart failure: Secondary | ICD-10-CM | POA: Diagnosis not present

## 2018-07-13 DIAGNOSIS — R4020 Unspecified coma: Secondary | ICD-10-CM | POA: Diagnosis not present

## 2018-07-13 DIAGNOSIS — R509 Fever, unspecified: Secondary | ICD-10-CM | POA: Diagnosis not present

## 2018-07-13 DIAGNOSIS — J9601 Acute respiratory failure with hypoxia: Secondary | ICD-10-CM | POA: Diagnosis not present

## 2018-07-13 DIAGNOSIS — Z419 Encounter for procedure for purposes other than remedying health state, unspecified: Secondary | ICD-10-CM

## 2018-07-13 DIAGNOSIS — Z452 Encounter for adjustment and management of vascular access device: Secondary | ICD-10-CM | POA: Diagnosis not present

## 2018-07-13 DIAGNOSIS — G9341 Metabolic encephalopathy: Secondary | ICD-10-CM

## 2018-07-13 DIAGNOSIS — Z978 Presence of other specified devices: Secondary | ICD-10-CM

## 2018-07-13 DIAGNOSIS — E1122 Type 2 diabetes mellitus with diabetic chronic kidney disease: Secondary | ICD-10-CM | POA: Diagnosis present

## 2018-07-13 DIAGNOSIS — R42 Dizziness and giddiness: Secondary | ICD-10-CM | POA: Diagnosis not present

## 2018-07-13 DIAGNOSIS — D631 Anemia in chronic kidney disease: Secondary | ICD-10-CM | POA: Diagnosis present

## 2018-07-13 DIAGNOSIS — M1990 Unspecified osteoarthritis, unspecified site: Secondary | ICD-10-CM | POA: Diagnosis present

## 2018-07-13 DIAGNOSIS — E1165 Type 2 diabetes mellitus with hyperglycemia: Secondary | ICD-10-CM | POA: Diagnosis not present

## 2018-07-13 DIAGNOSIS — Z79899 Other long term (current) drug therapy: Secondary | ICD-10-CM

## 2018-07-13 DIAGNOSIS — R202 Paresthesia of skin: Secondary | ICD-10-CM | POA: Diagnosis not present

## 2018-07-13 DIAGNOSIS — G709 Myoneural disorder, unspecified: Secondary | ICD-10-CM | POA: Diagnosis present

## 2018-07-13 DIAGNOSIS — Z515 Encounter for palliative care: Secondary | ICD-10-CM | POA: Diagnosis not present

## 2018-07-13 DIAGNOSIS — E11649 Type 2 diabetes mellitus with hypoglycemia without coma: Secondary | ICD-10-CM | POA: Diagnosis not present

## 2018-07-13 DIAGNOSIS — M4722 Other spondylosis with radiculopathy, cervical region: Secondary | ICD-10-CM | POA: Diagnosis present

## 2018-07-13 DIAGNOSIS — I97121 Postprocedural cardiac arrest following other surgery: Secondary | ICD-10-CM | POA: Diagnosis present

## 2018-07-13 DIAGNOSIS — R0600 Dyspnea, unspecified: Secondary | ICD-10-CM

## 2018-07-13 DIAGNOSIS — R55 Syncope and collapse: Secondary | ICD-10-CM | POA: Diagnosis not present

## 2018-07-13 DIAGNOSIS — R945 Abnormal results of liver function studies: Secondary | ICD-10-CM | POA: Diagnosis not present

## 2018-07-13 DIAGNOSIS — R4182 Altered mental status, unspecified: Secondary | ICD-10-CM | POA: Diagnosis not present

## 2018-07-13 DIAGNOSIS — I5032 Chronic diastolic (congestive) heart failure: Secondary | ICD-10-CM | POA: Diagnosis not present

## 2018-07-13 DIAGNOSIS — M5124 Other intervertebral disc displacement, thoracic region: Secondary | ICD-10-CM | POA: Diagnosis not present

## 2018-07-13 DIAGNOSIS — L8990 Pressure ulcer of unspecified site, unspecified stage: Secondary | ICD-10-CM

## 2018-07-13 DIAGNOSIS — Z887 Allergy status to serum and vaccine status: Secondary | ICD-10-CM

## 2018-07-13 DIAGNOSIS — R29727 NIHSS score 27: Secondary | ICD-10-CM | POA: Diagnosis not present

## 2018-07-13 DIAGNOSIS — R402253 Coma scale, best verbal response, oriented, at hospital admission: Secondary | ICD-10-CM | POA: Diagnosis present

## 2018-07-13 DIAGNOSIS — I959 Hypotension, unspecified: Secondary | ICD-10-CM | POA: Diagnosis not present

## 2018-07-13 DIAGNOSIS — I4901 Ventricular fibrillation: Secondary | ICD-10-CM | POA: Diagnosis not present

## 2018-07-13 DIAGNOSIS — M4802 Spinal stenosis, cervical region: Secondary | ICD-10-CM

## 2018-07-13 DIAGNOSIS — K5900 Constipation, unspecified: Secondary | ICD-10-CM | POA: Diagnosis present

## 2018-07-13 DIAGNOSIS — M4712 Other spondylosis with myelopathy, cervical region: Secondary | ICD-10-CM | POA: Diagnosis present

## 2018-07-13 DIAGNOSIS — Z4682 Encounter for fitting and adjustment of non-vascular catheter: Secondary | ICD-10-CM | POA: Diagnosis not present

## 2018-07-13 DIAGNOSIS — I10 Essential (primary) hypertension: Secondary | ICD-10-CM | POA: Diagnosis not present

## 2018-07-13 DIAGNOSIS — Z981 Arthrodesis status: Secondary | ICD-10-CM | POA: Diagnosis not present

## 2018-07-13 DIAGNOSIS — Z791 Long term (current) use of non-steroidal anti-inflammatories (NSAID): Secondary | ICD-10-CM

## 2018-07-13 DIAGNOSIS — M5412 Radiculopathy, cervical region: Secondary | ICD-10-CM | POA: Diagnosis present

## 2018-07-13 DIAGNOSIS — E119 Type 2 diabetes mellitus without complications: Secondary | ICD-10-CM

## 2018-07-13 DIAGNOSIS — W19XXXA Unspecified fall, initial encounter: Secondary | ICD-10-CM | POA: Diagnosis not present

## 2018-07-13 DIAGNOSIS — I493 Ventricular premature depolarization: Secondary | ICD-10-CM | POA: Diagnosis not present

## 2018-07-13 DIAGNOSIS — R748 Abnormal levels of other serum enzymes: Secondary | ICD-10-CM | POA: Diagnosis not present

## 2018-07-13 DIAGNOSIS — E161 Other hypoglycemia: Secondary | ICD-10-CM | POA: Diagnosis not present

## 2018-07-13 DIAGNOSIS — R680 Hypothermia, not associated with low environmental temperature: Secondary | ICD-10-CM | POA: Diagnosis not present

## 2018-07-13 DIAGNOSIS — Z8673 Personal history of transient ischemic attack (TIA), and cerebral infarction without residual deficits: Secondary | ICD-10-CM

## 2018-07-13 DIAGNOSIS — E162 Hypoglycemia, unspecified: Secondary | ICD-10-CM | POA: Diagnosis not present

## 2018-07-13 DIAGNOSIS — M4322 Fusion of spine, cervical region: Secondary | ICD-10-CM | POA: Diagnosis not present

## 2018-07-13 DIAGNOSIS — G9340 Encephalopathy, unspecified: Secondary | ICD-10-CM | POA: Diagnosis not present

## 2018-07-13 DIAGNOSIS — J9811 Atelectasis: Secondary | ICD-10-CM | POA: Diagnosis not present

## 2018-07-13 DIAGNOSIS — R451 Restlessness and agitation: Secondary | ICD-10-CM | POA: Diagnosis not present

## 2018-07-13 DIAGNOSIS — I25709 Atherosclerosis of coronary artery bypass graft(s), unspecified, with unspecified angina pectoris: Secondary | ICD-10-CM | POA: Diagnosis present

## 2018-07-13 DIAGNOSIS — R4189 Other symptoms and signs involving cognitive functions and awareness: Secondary | ICD-10-CM | POA: Diagnosis not present

## 2018-07-13 DIAGNOSIS — I25119 Atherosclerotic heart disease of native coronary artery with unspecified angina pectoris: Secondary | ICD-10-CM | POA: Diagnosis not present

## 2018-07-13 DIAGNOSIS — R51 Headache: Secondary | ICD-10-CM | POA: Diagnosis present

## 2018-07-13 DIAGNOSIS — M5410 Radiculopathy, site unspecified: Secondary | ICD-10-CM | POA: Insufficient documentation

## 2018-07-13 DIAGNOSIS — Z955 Presence of coronary angioplasty implant and graft: Secondary | ICD-10-CM

## 2018-07-13 DIAGNOSIS — Z7902 Long term (current) use of antithrombotics/antiplatelets: Secondary | ICD-10-CM

## 2018-07-13 DIAGNOSIS — M50223 Other cervical disc displacement at C6-C7 level: Secondary | ICD-10-CM | POA: Diagnosis not present

## 2018-07-13 HISTORY — DX: Presence of aortocoronary bypass graft: Z95.1

## 2018-07-13 LAB — CBC WITH DIFFERENTIAL/PLATELET
Abs Immature Granulocytes: 0 10*3/uL (ref 0.0–0.1)
Basophils Absolute: 0 10*3/uL (ref 0.0–0.1)
Basophils Relative: 0 %
EOS ABS: 0.1 10*3/uL (ref 0.0–0.7)
Eosinophils Relative: 1 %
HEMATOCRIT: 39.9 % (ref 39.0–52.0)
Hemoglobin: 12.6 g/dL — ABNORMAL LOW (ref 13.0–17.0)
IMMATURE GRANULOCYTES: 1 %
LYMPHS ABS: 0.9 10*3/uL (ref 0.7–4.0)
Lymphocytes Relative: 15 %
MCH: 28.1 pg (ref 26.0–34.0)
MCHC: 31.6 g/dL (ref 30.0–36.0)
MCV: 89.1 fL (ref 78.0–100.0)
Monocytes Absolute: 0.3 10*3/uL (ref 0.1–1.0)
Monocytes Relative: 5 %
NEUTROS PCT: 78 %
Neutro Abs: 4.8 10*3/uL (ref 1.7–7.7)
PLATELETS: 252 10*3/uL (ref 150–400)
RBC: 4.48 MIL/uL (ref 4.22–5.81)
RDW: 12.9 % (ref 11.5–15.5)
WBC: 6.1 10*3/uL (ref 4.0–10.5)

## 2018-07-13 LAB — URINALYSIS, ROUTINE W REFLEX MICROSCOPIC
Bilirubin Urine: NEGATIVE
GLUCOSE, UA: NEGATIVE mg/dL
Hgb urine dipstick: NEGATIVE
Ketones, ur: NEGATIVE mg/dL
LEUKOCYTES UA: NEGATIVE
Nitrite: NEGATIVE
PH: 5 (ref 5.0–8.0)
Protein, ur: 30 mg/dL — AB
SPECIFIC GRAVITY, URINE: 1.014 (ref 1.005–1.030)

## 2018-07-13 LAB — CBG MONITORING, ED
GLUCOSE-CAPILLARY: 138 mg/dL — AB (ref 70–99)
Glucose-Capillary: 179 mg/dL — ABNORMAL HIGH (ref 70–99)

## 2018-07-13 LAB — COMPREHENSIVE METABOLIC PANEL
ALBUMIN: 3.1 g/dL — AB (ref 3.5–5.0)
ALT: 15 U/L (ref 0–44)
ANION GAP: 9 (ref 5–15)
AST: 20 U/L (ref 15–41)
Alkaline Phosphatase: 88 U/L (ref 38–126)
BILIRUBIN TOTAL: 0.7 mg/dL (ref 0.3–1.2)
BUN: 18 mg/dL (ref 8–23)
CALCIUM: 8.5 mg/dL — AB (ref 8.9–10.3)
CO2: 26 mmol/L (ref 22–32)
CREATININE: 1.25 mg/dL — AB (ref 0.61–1.24)
Chloride: 100 mmol/L (ref 98–111)
GFR calc non Af Amer: 54 mL/min — ABNORMAL LOW (ref >60)
GLUCOSE: 245 mg/dL — AB (ref 70–99)
Potassium: 4.5 mmol/L (ref 3.5–5.1)
Sodium: 135 mmol/L (ref 135–145)
TOTAL PROTEIN: 5.9 g/dL — AB (ref 6.5–8.1)

## 2018-07-13 MED ORDER — DEXTROSE 5 % IV SOLN
3.0000 g | INTRAVENOUS | Status: AC
  Administered 2018-07-16 (×2): 3 g via INTRAVENOUS
  Filled 2018-07-13: qty 3

## 2018-07-13 MED ORDER — MECLIZINE HCL 25 MG PO TABS
25.0000 mg | ORAL_TABLET | Freq: Once | ORAL | Status: AC
  Administered 2018-07-13: 25 mg via ORAL
  Filled 2018-07-13: qty 1

## 2018-07-13 MED ORDER — FENTANYL CITRATE (PF) 100 MCG/2ML IJ SOLN
50.0000 ug | Freq: Once | INTRAMUSCULAR | Status: AC
  Administered 2018-07-13: 50 ug via INTRAVENOUS
  Filled 2018-07-13: qty 2

## 2018-07-13 MED ORDER — DIAZEPAM 5 MG PO TABS
5.0000 mg | ORAL_TABLET | Freq: Once | ORAL | Status: DC
  Filled 2018-07-13: qty 1

## 2018-07-13 MED ORDER — METHYLPREDNISOLONE SODIUM SUCC 125 MG IJ SOLR
125.0000 mg | Freq: Once | INTRAMUSCULAR | Status: AC
  Administered 2018-07-13: 125 mg via INTRAVENOUS
  Filled 2018-07-13: qty 2

## 2018-07-13 MED ORDER — GABAPENTIN 100 MG PO CAPS
100.0000 mg | ORAL_CAPSULE | Freq: Three times a day (TID) | ORAL | Status: DC
  Administered 2018-07-13: 100 mg via ORAL
  Filled 2018-07-13: qty 1

## 2018-07-13 MED ORDER — LORAZEPAM 2 MG/ML IJ SOLN
1.0000 mg | Freq: Once | INTRAMUSCULAR | Status: AC
  Administered 2018-07-13: 1 mg via INTRAVENOUS
  Filled 2018-07-13: qty 1

## 2018-07-13 NOTE — Progress Notes (Addendum)
I spoke to Edward [name] at Dr Colleen Can office who said that as of now surgery is still on for Monday,  "Dr Zada Finders will be checking in on Edward [name] over the weekend."  I went to ED and saw Edward [name], he is waiting to go to MRI.  I typed  Pre preprocedure instructions and went over them with him, I gave him Surgical Scrub.  Edward [name] voiced understanding, but he wanted me to call his brother and give him the information.  I called Lenna Sciara and I informed him of the instructions I gave Edward [name].  Edward Avenir Lozinski said that he will assist Edward [name] as needed.

## 2018-07-13 NOTE — Pre-Procedure Instructions (Addendum)
CRUZE ZINGARO  07/13/2018    Your procedure is scheduled on Monday, September 16 .  Report to [facility] Admitting at 7:20  A.M.   Call this number if you have problems the morning of surgery: [phone]  This is the number for the Pre- Surgical Desk.   Remember:  Do not eat or drink after midnight Sunday, September 15.     Take these medicines the morning of surgery with A SIP OF WATER : amLODipine (NORVASC)  Eye Drops gabapentin (NEURONTIN) primidone (MYSOLINE)   propranolol (INDERAL)   see separate page for instructions on Insulin  If needed albuterol (VENTOLIN HFA) inhaler, bring it to the hospital with you esomeprazole (NEXIUM)  meclizine (ANTIVERT)  nitroGLYCERIN (NITROSTAT) oxyCODONE-acetaminophen (PERCOCET/ROXICET)    Special Instructions:  [facility]- Preparing For Surgery  Before surgery, you can play an important role. Because skin is not sterile, your skin needs to be as free of germs as possible. You can reduce the number of germs on your skin by washing with CHG (chlorahexidine gluconate) Soap before surgery.  CHG is an antiseptic cleaner which kills germs and bonds with the skin to continue killing germs even after washing.    Oral Hygiene is also important to reduce your risk of infection.  Remember - BRUSH YOUR TEETH THE MORNING OF SURGERY WITH YOUR REGULAR TOOTHPASTE  Please do not use if you have an allergy to CHG or antibacterial soaps. If your skin becomes reddened/irritated stop using the CHG.  Do not shave (including legs and underarms) for at least 48 hours prior to first CHG shower. It is OK to shave your face.  Please follow these instructions carefully.   1. Shower the NIGHT BEFORE SURGERY and the MORNING OF SURGERY with CHG.   2. If you chose to wash your hair, wash your hair first as usual with your normal shampoo.  3. After you shampoo,Wash your face and private area with the soap you use at home, then rinse.rinse your  hair and body thoroughly to remove the shampoo.  4. Use CHG as you would any other liquid soap. You can apply CHG directly to the skin and wash gently with a scrungie or a clean washcloth.   5. Apply the CHG Soap to your body ONLY FROM THE NECK DOWN.  Do not use on open wounds or open sores. Avoid contact with your eyes, ears, mouth and genitals (private parts).   6. Wash thoroughly, paying special attention to the area where your surgery will be performed.  7. Thoroughly rinse your body with warm water from the neck down.  8. DO NOT shower/wash with your normal soap after using and rinsing off the CHG Soap.  9. Pat yourself dry with a CLEAN TOWEL.  10. Wear CLEAN PAJAMAS to bed the night before surgery, wear comfortable clothes the morning of surgery  11. Place CLEAN SHEETS on your bed the night of your first shower and DO NOT SLEEP WITH PETS.  Day of Surgery: Shower as above Do not apply any deodorants/lotions, powders or colognes..  Please wear clean clothes to the hospital/surgery center.   Remember to brush your teeth WITH YOUR REGULAR TOOTHPASTE.  Do not wear jewelry, make-up or nail polish.  Do not shave 48 hours prior to surgery.  Men may shave face and neck.  Do not bring valuables to the hospital.  [facility] is not responsible for any belongings or valuables.  Contacts, dentures or bridgework may not  be worn into surgery.  Leave your suitcase in the car.  After surgery it may be brought to your room.  For patients admitted to the hospital, discharge time will be determined by your treatment team.  Patients discharged the day of surgery will not be allowed to drive home.    How to Manage Your Diabetes Before and After Surgery  Why is it important to control my blood sugar before and after surgery? . Improving blood sugar levels before and after surgery helps healing and can limit problems. . A way of improving blood sugar control is eating a healthy diet by: o   Eating less sugar and carbohydrates o  Increasing activity/exercise o  Talking with your doctor about reaching your blood sugar goals . High blood sugars (greater than 180 mg/dL) can raise your risk of infections and slow your recovery, so you will need to focus on controlling your diabetes during the weeks before surgery. . Make sure that the doctor who takes care of your diabetes knows about your planned surgery including the date and location.  How do I manage my blood sugar before surgery? . Check your blood sugar at least 4 times a day, starting 2 days before surgery, to make sure that the level is not too high or low. o Check your blood sugar the morning of your surgery when you wake up and every 2 hours until you get to the Short Stay unit. . If your blood sugar is less than 70 mg/dL, you will need to treat for low blood sugar: o Do not take insulin. o Treat a low blood sugar (less than 70 mg/dL) with  cup of clear juice (cranberry or apple), 4 glucose tablets, OR glucose gel. Recheck blood sugar in 15 minutes after treatment (to make sure it is greater than 70 mg/dL). If your blood sugar is not greater than 70 mg/dL on recheck, call [phone] o  for further instructions. . Report your blood sugar to the short stay nurse when you get to Short Stay.  . If you are admitted to the hospital after surgery: o Your blood sugar will be checked by the staff and you will probably be given insulin after surgery (instead of oral diabetes medicines) to make sure you have good blood sugar levels. o The goal for blood sugar control after surgery is 80-180 mg/dL  WHAT DO I DO ABOUT MY DIABETES MEDICATION?  . THE NIGHT BEFORE SURGERY, take 15 units of Novolion N insulin.     . THE MORNING OF SURGERY, take 15 units of Novlin N  Insulin. .   . If your CBG is greater than 220 mg/dL, you may take  of your sliding scale (correction) dose of insulin.   Patient Signature:  Date:   Nurse  Signature:  Date:

## 2018-07-13 NOTE — ED Notes (Signed)
Patient transported to MRI 

## 2018-07-13 NOTE — Progress Notes (Signed)
I spoke with Edward [name] at Dr Colleen Can office and asked for orders.  I also informed Edward [name] that patient is currently in [facility] with complaints of dizziness this am.  Patient reported that he may have take Insulin  X 2 last pm and that he              took oxycodone.

## 2018-07-13 NOTE — H&P (Signed)
History and Physical   Edward [name] NWG:[id number] DOB: 1941/06/07 DOA: 07/13/2018  Referring MD/NP/PA: Dr. Dayna Barker  PCP: Carollee Herter, Alferd Apa, DO   Outpatient Specialists: Dr. Posey Pronto neurology  Patient coming from: Home  Chief Complaint: Neck pain and dizziness  HPI: Edward [name] is a 77 y.o. male with medical history significant of cervical radiculopathy, coronary artery disease status post CABG, chronic diastolic CHF, chronic kidney disease stage II, insulin-dependent diabetes, hypertension, osteoarthritis, morbid obesity and recent hospitalization due to right arm numbness and weakness who was only discharged yesterday after admission with cervical radiculopathy.  Patient was scheduled to have surgery next week but upon arrival home he took his medications and started feeling dizzy and could not even walk.  His neck pain gets slightly worse.  Patient was literally in bed all day until his brother called EMS and he was brought back to the ER.  He is worried about leaving the hospital at this point.  Conversation with neurosurgery was done and patient is scheduled to be seen by neurosurgery in the morning for review of his surgical schedule.  Is complaining of 5 out of 10 pain at the moment.  Also constipation.  Denied any fever or chills no nausea vomiting or diarrhea.  ED Course: Patient is currently afebrile.  Vitals are stable white count is 6.1 hemoglobin 12.6.  BUN/creatinine are at baseline with creatinine of 1.25 and calcium 8.5.  Glucose is 245.  MRI of the brain showed no significant findings.  Dr. Dr Zada Finders who recommended admitting the patient to the hospital and he will see him in the morning  Review of Systems: As per HPI otherwise 10 point review of systems negative.    Past Medical History:  Diagnosis Date  . Adenomatous colon polyp 04/2011  . CAD (coronary artery disease)    a. 01/2015 DES to LAD  b. 12/2015: [place] 85% oLCx lesion--> rx therapy.  . Chronic diastolic CHF  (congestive heart failure) ([place])    a. 05/2017 Echo: EF 60-65%, Gr1 DD, no rwma, mildly dil LA, nl RV fxn, mild TR.  . CKD (chronic kidney disease), stage II    a. probable CKD II-III with baseline CR 1.1-1.3.  . DM type 2 (diabetes mellitus, type 2), insulin dependent 01/29/2014   fasting cbg 50-120 with new regimen  . Dyslipidemia, goal LDL below 70 01/29/2014  . Enlarged prostate   . Essential tremor    a. on proprnolol  . GERD (gastroesophageal reflux disease)   . Hx of CABG   . Hypertension   . Internal hemorrhoid   . Sleep apnea    does not use cpap (06/04/2015)  . TIA (transient ischemic attack) 2002    Past Surgical History:  Procedure Laterality Date  . BACK SURGERY    . CARDIAC CATHETERIZATION  01/28/14   + CAD treat medically  . CARDIAC CATHETERIZATION N/A 12/21/2015   Procedure: Right/Left Heart Cath and Coronary Angiography;  Surgeon: Belva Crome, MD;  Location: [place] [facility];  Service: Cardiovascular;  Laterality: N/A;  . CARDIAC CATHETERIZATION N/A 11/02/2016   Procedure: Left Heart Cath and Coronary Angiography;  Surgeon: Nelva Bush, MD;  Location: [place] [facility];  Service: Cardiovascular;  Laterality: N/A;  . CATARACT EXTRACTION Bilateral   . CATARACT EXTRACTION, BILATERAL Bilateral   . CORONARY ANGIOPLASTY WITH STENT PLACEMENT  01/30/2015   DES [place] to LAD by Dr Tamala Julian  . CORONARY ARTERY BYPASS GRAFT N/A 11/09/2016   Procedure:  CORONARY ARTERY BYPASS GRAFTING (CABG) x 2;  Surgeon: Melrose Nakayama, MD;  Location: [facility] [facility];  Service: Open Heart Surgery;  Laterality: N/A;  . ENDOVEIN HARVEST OF GREATER SAPHENOUS VEIN Left 11/09/2016   Procedure: ENDOVEIN HARVEST OF GREATER SAPHENOUS VEIN;  Surgeon: Melrose Nakayama, MD;  Location: [place];  Service: Open Heart Surgery;  Laterality: Left;  . JOINT REPLACEMENT    . LEFT HEART CATH AND CORS/GRAFTS ANGIOGRAPHY N/A 03/09/2018   Procedure: LEFT HEART CATH AND CORS/GRAFTS ANGIOGRAPHY;  Surgeon: Belva Crome, MD;  Location: [place] [facility];  Service: Cardiovascular;  Laterality: N/A;  . LEFT HEART CATHETERIZATION WITH CORONARY ANGIOGRAM N/A 01/28/2014   Procedure: LEFT HEART CATHETERIZATION WITH CORONARY ANGIOGRAM;  Surgeon: Sinclair Grooms, MD;  Location[facility] C[facility] LAB;  Service: Cardiovascular;  Laterality: N/A;  . LEFT HEART CATHETERIZATION WITH CORONARY ANGIOGRAM N/A 01/30/2015   Procedure: LEFT HEART CATHETERIZATION WITH CORONARY ANGIOGRAM;  Surgeon: Belva Crome, MD;  Location: [facility] CATH LAB;  Service: Cardiovascular;  Laterality: N/A;  . LUMBAR [place]    . SHOULDER ARTHROSCOPY Right 07/30/2014   Procedure: Right Shoulder Arthroscopy, Debridement, Decompression, Manipulation Under Anesthesia;  Surgeon: Newt Minion, MD;  Location: [place];  Service: Orthopedics;  Laterality: Right;  . TEE WITHOUT CARDIOVERSION N/A 11/09/2016   Procedure: TRANSESOPHAGEAL ECHOCARDIOGRAM (TEE);  Surgeon: Melrose Nakayama, MD;  Location: [place];  Service: Open Heart Surgery;  Laterality: N/A;  . TOTAL KNEE ARTHROPLASTY Bilateral      reports that he has never smoked. He has never used smokeless tobacco. He reports that he does not drink alcohol or use drugs.  Allergies  Allergen Reactions  . Pneumococcal Vaccines Anaphylaxis    Family History  Problem Relation Age of Onset  . CAD Brother        2 brothers - CABG  . Alzheimer's disease Sister   . CAD Sister   . Prostate cancer Brother   . Asthma Brother        2 brothers   . Diabetes Other        entire family  . Colon cancer Neg Hx      Prior to Admission medications   Medication Sig Start Date End Date Taking? Authorizing Provider  amLODipine (NORVASC) 10 MG tablet Take 1 tablet (10 mg total) by mouth daily. 03/13/18  Yes Roma Schanz R, DO  bismuth subsalicylate (PEPTO BISMOL) 262 MG/15ML suspension Take 30 mLs by mouth as needed for indigestion.   Yes [provider]  clonazePAM (KLONOPIN) 1 MG tablet Take 1 tablet  (1 mg total) by mouth at bedtime. Patient taking differently: Take 1 mg by mouth at bedtime as needed (for sleep or anxiety).  03/13/18  Yes Ann Held, DO  cyclobenzaprine (FLEXERIL) 5 MG tablet Take 1 tablet (5 mg total) by mouth at bedtime as needed for muscle spasms (headache). Patient taking differently: Take 5 mg by mouth at bedtime as needed (for muscle spasms, leg cramps, or headaches).  04/23/18  Yes Patel, Donika K, DO  diclofenac sodium (VOLTAREN) 1 % GEL Apply 2 g topically 4 (four) times daily as needed (for pain).    Yes [provider]  dicyclomine (BENTYL) 10 MG capsule Take 10 mg by mouth 2 (two) times daily.  01/01/16  Yes [provider]  erythromycin ophthalmic ointment Place 1 application into both eyes See admin instructions. Apply to both eyelids once a day as needed for itching  Yes [provider]  esomeprazole (NEXIUM) 40 MG capsule Take 40 mg by mouth daily as needed (for reflux).  01/01/16  Yes [provider]  furosemide (LASIX) 40 MG tablet Take 1.5 tablets (60 mg total) by mouth daily. 03/29/18 07/13/18 Yes Duke, Tami Lin, PA  gabapentin (NEURONTIN) 300 MG capsule take 1 capsule by mouth at bedtime for 1 week then INCREASE TO 1 CAP TWICE DAILY Patient taking differently: Take 300 mg by mouth 2 (two) times daily.  03/13/18  Yes Roma Schanz R, DO  hydrALAZINE (APRESOLINE) 25 MG tablet Take 1 tablet (25 mg total) by mouth 3 (three) times daily. 03/28/18 07/13/18 Yes Duke, Tami Lin, PA  insulin aspart (NOVOLOG) 100 UNIT/ML injection Inject 40 Units into the skin 3 (three) times daily before meals. 06/25/18  Yes Philemon Kingdom, MD  insulin NPH Human (NOVOLIN N RELION) 100 UNIT/ML injection Inject 30 Units into the skin 2 (two) times daily before a meal.    Yes [provider]  meclizine (ANTIVERT) 25 MG tablet Take 1 tablet (25 mg total) by mouth 2 (two) times daily as needed for dizziness. 06/10/17  Yes Theora Gianotti, NP  nitroGLYCERIN (NITROSTAT) 0.4 MG SL tablet Place 1 tablet (0.4 mg total) under the tongue every 5 (five) minutes as needed for chest pain. Max: 3 doses per episode. Patient taking differently: Place 0.4-0.8 mg under the tongue every 5 (five) minutes x 3 doses as needed for chest pain.  11/07/17  Yes Burtis Junes, NP  oxyCODONE-acetaminophen (PERCOCET/ROXICET) 5-325 MG tablet Take 1-2 tablets by mouth every 4 (four) hours as needed for moderate pain or severe pain. 07/11/18  Yes Hosie Poisson, MD  polyethylene glycol (MIRALAX / GLYCOLAX) packet Take 17 g by mouth daily. Patient taking differently: Take 17 g by mouth daily as needed for mild constipation.  07/12/18  Yes Hosie Poisson, MD  Polyvinyl Alcohol-Povidone (REFRESH OP) Place 1 drop into both eyes 3 (three) times daily as needed (for dry eyes). Reported on 12/25/2015   Yes [provider]  potassium chloride SA (K-DUR,KLOR-CON) 20 MEQ tablet TAKE 1 & 1/2 (ONE & ONE-HALF) TABLETS BY MOUTH ONCE DAILY Patient taking differently: Take 30 mEq by mouth daily.  04/27/18  Yes Duke, Tami Lin, PA  prednisoLONE acetate (PRED FORTE) 1 % ophthalmic suspension Place 2 drops into both eyes 2 (two) times daily as needed for itching.   Yes [provider]  primidone (MYSOLINE) 50 MG tablet Take 1 tablet (50 mg total) by mouth 2 (two) times daily. 04/23/18  Yes Patel, Donika K, DO  propranolol (INDERAL) 40 MG tablet Take 1 tablet (40 mg total) by mouth 3 (three) times daily. 03/13/18  Yes Roma Schanz R, DO  rosuvastatin (CRESTOR) 20 MG tablet Take 1 tablet (20 mg total) by mouth daily. Patient taking differently: Take 20 mg by mouth 2 (two) times a week.  03/13/18 03/08/19 Yes Ann Held, DO  albuterol (VENTOLIN HFA) 108 (90 Base) MCG/ACT inhaler inhale 2 puffs by mouth every 6 hours if needed for wheezing or shortness of breath Patient not taking: Reported on 07/13/2018 07/24/17   Ann Held, DO   aspirin EC 81 MG tablet Take 1 tablet (81 mg total) by mouth daily. Hold aspirin for 5 days for the surgery, restart it as per neuro surgery. 07/11/18   Hosie Poisson, MD  clopidogrel (PLAVIX) 75 MG tablet Take 1 tablet (75 mg total) by mouth daily. Hold plavix  for the surgery, restart it as per neuro surgery. 07/11/18   Hosie Poisson, MD  Insulin Syringe-Needle U-100 (BD INSULIN SYRINGE U/F) 31G X 5/16" 1 ML MISC Use 4 times per day to inject insulin. 01/16/18   Philemon Kingdom, MD  senna-docusate (SENOKOT-S) 8.6-50 MG tablet Take 2 tablets by mouth 2 (two) times daily. 07/12/18   Hosie Poisson, MD    Physical Exam: Vitals:   07/13/18 2015 07/13/18 2032 07/13/18 2100 07/13/18 2130  BP: (!) 143/57 (!) 135/58 (!) 139/47 134/74  Pulse: 79 75 79 83  Resp: 13 18 15  (!) 26  Temp:      TempSrc:      SpO2: 98% 98% 98% 98%  Weight:      Height:          Constitutional: NAD, calm, comfortable Vitals:   07/13/18 2015 07/13/18 2032 07/13/18 2100 07/13/18 2130  BP: (!) 143/57 (!) 135/58 (!) 139/47 134/74  Pulse: 79 75 79 83  Resp: 13 18 15  (!) 26  Temp:      TempSrc:      SpO2: 98% 98% 98% 98%  Weight:      Height:       General: Stable morbidly obese mild distress Eyes: PERRL, lids and conjunctivae normal ENMT: Mucous membranes are moist. Posterior pharynx clear of any exudate or lesions.Normal dentition.  Neck: normal, decreased mobility and tender no masses, no thyromegaly Respiratory: clear to auscultation bilaterally, no wheezing, no crackles. Normal respiratory effort. No accessory muscle use.  Cardiovascular: Regular rate and rhythm, no murmurs / rubs / gallops. No extremity edema. 2+ pedal pulses. No carotid bruits.  Abdomen: no tenderness, no masses palpated. No hepatosplenomegaly. Bowel sounds positive.  Musculoskeletal: no clubbing / cyanosis. No joint deformity upper and lower extremities. Good ROM, no contractures. Normal muscle tone.  Tender cervical spine on the left and  right side Skin: no rashes, lesions, ulcers. No induration Neurologic: CN 2-12 grossly intact. Sensation slightly decreased in right upper extremity with decreased power to 4 out of 5, DTR normal. Strength 5/5 in all 4.  Psychiatric: Normal judgment and insight. Alert and oriented x 3. Normal mood.     Labs on Admission: I have personally reviewed following labs and imaging studies  CBC: Recent Labs  Lab 07/09/18 1027 07/13/18 1238  WBC 6.5 6.1  NEUTROABS  --  4.8  HGB 12.2* 12.6*  HCT 38.3* 39.9  MCV 89.9 89.1  PLT 229 628   Basic Metabolic Panel: Recent Labs  Lab 07/09/18 1027 07/10/18 0430 07/11/18 1046 07/13/18 1238  NA 140 136 136 135  K 3.9 4.2 4.8 4.5  CL 105 102 102 100  CO2 29 27 26 26   GLUCOSE 182* 335* 246* 245*  BUN 14 10 14 18   CREATININE 1.11 1.06 1.19 1.25*  CALCIUM 8.6* 8.2* 8.6* 8.5*   GFR: Estimated Creatinine Clearance: 65.3 mL/min (A) (by C-G formula based on SCr of 1.25 mg/dL (H)). Liver Function Tests: Recent Labs  Lab 07/13/18 1238  AST 20  ALT 15  ALKPHOS 88  BILITOT 0.7  PROT 5.9*  ALBUMIN 3.1*   No results for input(s): LIPASE, AMYLASE in the last 168 hours. No results for input(s): AMMONIA in the last 168 hours. Coagulation Profile: Recent Labs  Lab 07/10/18 0430  INR 1.09   Cardiac Enzymes: No results for input(s): CKTOTAL, CKMB, CKMBINDEX, TROPONINI in the last 168 hours. BNP (last 3 results) No results for input(s): PROBNP in the last 8760 hours. HbA1C: No  results for input(s): HGBA1C in the last 72 hours. CBG: Recent Labs  Lab 07/11/18 2156 07/12/18 0732 07/12/18 1113 07/13/18 1138 07/13/18 1641  GLUCAP 210* 148* 260* 138* 179*   Lipid Profile: No results for input(s): CHOL, HDL, LDLCALC, TRIG, CHOLHDL, LDLDIRECT in the last 72 hours. Thyroid Function Tests: No results for input(s): TSH, T4TOTAL, FREET4, T3FREE, THYROIDAB in the last 72 hours. Anemia Panel: No results for input(s): VITAMINB12, FOLATE,  FERRITIN, TIBC, IRON, RETICCTPCT in the last 72 hours. Urine analysis:    Component Value Date/Time   COLORURINE YELLOW 07/13/2018 [place] 07/13/2018 1234   LABSPEC 1.014 07/13/2018 1234   PHURINE 5.0 07/13/2018 1234   GLUCOSEU NEGATIVE 07/13/2018 1234   HGBUR NEGATIVE 07/13/2018 1234   BILIRUBINUR NEGATIVE 07/13/2018 1234   BILIRUBINUR neg 01/02/2017 0959   KETONESUR NEGATIVE 07/13/2018 1234   PROTEINUR 30 (A) 07/13/2018 1234   UROBILINOGEN negative 01/02/2017 0959   NITRITE NEGATIVE 07/13/2018 1234   LEUKOCYTESUR NEGATIVE 07/13/2018 1234   Sepsis Labs: @LABRCNTIP (procalcitonin:4,lacticidven:4) )No results found for this or any previous visit (from the past 240 hour(s)).   Radiological Exams on Admission: Mr Brain Wo Contrast  Result Date: 07/13/2018 CLINICAL DATA:  Initial evaluation for acute dizziness. EXAM: MRI HEAD WITHOUT CONTRAST TECHNIQUE: Multiplanar, multiecho pulse sequences of the brain and surrounding structures were obtained without intravenous contrast. COMPARISON:  Prior MRI from 07/09/2018. FINDINGS: Brain: Mild atrophy with chronic small vessel ischemic disease. Small remote lacunar infarct present within the right thalamus. No acute intracranial infarct. No evidence for acute or chronic intracranial hemorrhage. No mass lesion, midline shift or mass effect. No hydrocephalus. No extra-axial fluid collection. Pituitary gland within normal limits. Vascular: Major intracranial vascular flow voids maintained. Skull and upper cervical spine: Craniocervical junction within normal limits. Upper cervical spine normal. No focal marrow replacing lesion. Scalp soft tissues demonstrate no acute finding. Sinuses/Orbits: Sequelae of prior ocular lens replacement bilaterally. Paranasal sinuses clear. Small bilateral mastoid effusions noted, of doubtful significance. Other: None. IMPRESSION: 1. No acute intracranial abnormality. 2. Mild atrophy with chronic small vessel  ischemic disease, with small remote right thalamic lacunar infarct, stable. Electronically Signed   By: Jeannine Boga M.D.   On: 07/13/2018 20:08    Assessment/Plan Principal Problem:   Cervical radiculopathy Active Problems:   Essential hypertension   Essential tremor   Obstructive sleep apnea   GERD (gastroesophageal reflux disease)   Diabetes mellitus (HCC)   Chronic kidney disease, stage III (moderate) (HCC)   Coronary artery disease involving coronary bypass graft of native heart with angina pectoris ([place])     #1 cervical radiculopathy: Patient is going to be admitted for pain management steroids continue with Neurontin.  Neurosurgery will see patient in the morning and have a conversation regarding surgical schedule.  Meanwhile PT and OT will follow based on recommendations from neurosurgery.  #2 diabetes: Continue home regimen with sliding scale insulin.  #3 hypertension: Continue home blood pressure medications.  Adjust as necessary.  #4 chronic kidney disease stage III: Patient appears to be at baseline.  Continue to monitor  #4 GERD: Continue with PPIs.  #5 constipation: Continue with laxatives.  Add Colace.  #6 obstructive sleep apnea: CPAP at night.   DVT prophylaxis: Lovenox Code Status: Full code Family Communication: No family at the bedside Disposition Plan: To be determined Consults called: Neurosurgery Dr. Zada Finders Admission status: Inpatient  Severity of Illness: The appropriate patient status for this patient is INPATIENT. Inpatient status is judged to be  reasonable and necessary in order to provide the required intensity of service to ensure the patient's safety. The patient's presenting symptoms, physical exam findings, and initial radiographic and laboratory data in the context of their chronic comorbidities is felt to place them at high risk for further clinical deterioration. Furthermore, it is not anticipated that the patient will be medically  stable for discharge from the hospital within 2 midnights of admission. The following factors support the patient status of inpatient.   " The patient's presenting symptoms include neck pain and dizziness. " The worrisome physical exam findings include decreased mobility and evidence of cervical radiculopathy. " The initial radiographic and laboratory data are worrisome because of previous imaging and known cervical disc disease. " The chronic co-morbidities include diabetes and cervical disc disease.   * I certify that at the point of admission it is my clinical judgment that the patient will require inpatient hospital care spanning beyond 2 midnights from the point of admission due to high intensity of service, high risk for further deterioration and high frequency of surveillance required.Barbette Merino MD [facility] Pager [phone]  If 7PM-7AM, please contact night-coverage [email or web address] Password [email or web address]  07/13/2018, 10:00 PM

## 2018-07-13 NOTE — ED Notes (Signed)
Patient assisted back to bed from bedside commode c/o dizziness when standing however gait is steady

## 2018-07-13 NOTE — Progress Notes (Addendum)
Anesthesia Chart Review: SAME DAY WORKUP   Case:  [id number] Date/Time:  07/16/18 0934   Procedure:  CERVICAL 3-4 CERVICAL 4-5 CERVICAL 5-6 CERVICAL 6-7 ANTERIOR CERVICAL DECOMPRESSION/DISCECTOMY/FUSION (N/A ) - CERVICAL 3-4 CERVICAL 4-5 CERVICAL 5-6 CERVICAL 6-7 ANTERIOR CERVICAL DECOMPRESSION/DISCECTOMY/FUSION   Anesthesia type:  General   Pre-op diagnosis:  CERVICAL STENOSIS   Location:  [facility] OR ROOM 20 / [facility]   Surgeon:  Judith Part, MD     DISCUSSION: 77 yo male never smoker for above procedure. Pertinent hx includes HTN, TIA (2002), GERD, IDDM, CKD II-III, Essential tremor, CAD (01/2015 DES to LAD; 10/2016 CABG; [place] 2019 showed 60% proximal LAD stenosis, widely patent mid LAD stent, paient LM, and totally occlulded ostial Cx to proximal Cx. He had patent SVG-OM2 and patent LIMA-dLAD).  Pt admitted 9/10-9/09/2018 for eval of neck pain, right arm numbness and weakness. Dr. Irish Lack was consulted during hospitalization. Per his note 07/10/2018:  "Pre-op exam for possible surgery of neck  Cervical spondylosis with cord compression, radiculopathy.  Pt is acceptable risk for surgery - he is active and no chest pain.  His CABG was done 10/2016   X 2.  Non obstructive disease in RCA currently. Grafts patent on cath 02/2018.Marland KitchenMarland KitchenHolding ASA and Plavix 5 days prior to surgery"  Pt was seen at ED 07/13/2018 for dizziness and presyncope. EKG without acute changes. Reportedly his AM BG was 60 which is quite low for him. He reported that he may have accidentally taken his insulin twice the prior evening. His symptoms improved after eating and he was discharged.  Anticipate he can proceed with surgery as planned barring acute status change.    ADDENDUM 07/13/18: Pt had another episode of dizziness prior to discharge from ED and now MRI pending. Anticipate can still proceed with surgery as planned if MRI benign.  VS: There were no vitals taken for this visit.  PROVIDERS: Ann Held, DO is  PCP  Daneen Schick, MD is Cardiologist. Pt was last seen by Dr. Larae Grooms during recent admission on 9/11  LABS: Labs reviewed: Acceptable for surgery. Results for Edward [name], Edward [name] (MRN [id number]) as of 07/13/2018 13:23  Ref. Range 07/13/2018 12:38  COMPREHENSIVE METABOLIC PANEL Unknown Rpt  Sodium Latest Ref Range: 135 - 145 mmol/L 135  Potassium Latest Ref Range: 3.5 - 5.1 mmol/L 4.5  Chloride Latest Ref Range: 98 - 111 mmol/L 100  CO2 Latest Ref Range: 22 - 32 mmol/L 26  Glucose Latest Ref Range: 70 - 99 mg/dL 245 (H)  BUN Latest Ref Range: 8 - 23 mg/dL 18  Creatinine Latest Ref Range: 0.61 - 1.24 mg/dL 1.25 (H)  Calcium Latest Ref Range: 8.9 - 10.3 mg/dL 8.5 (L)  Anion gap Latest Ref Range: 5 - 15  9  Alkaline Phosphatase Latest Ref Range: 38 - 126 U/L 88  Albumin Latest Ref Range: 3.5 - 5.0 g/dL 3.1 (L)  AST Latest Ref Range: 15 - 41 U/L 20  ALT Latest Ref Range: 0 - 44 U/L 15  Total Protein Latest Ref Range: 6.5 - 8.1 g/dL 5.9 (L)  Total Bilirubin Latest Ref Range: 0.3 - 1.2 mg/dL 0.7  GFR, Est Non African American Latest Ref Range: >60 mL/min 54 (L)  GFR, Est African American Latest Ref Range: >60 mL/min >60  WBC Latest Ref Range: 4.0 - 10.5 K/uL 6.1  RBC Latest Ref Range: 4.22 - 5.81 MIL/uL 4.48  Hemoglobin Latest Ref Range: 13.0 - 17.0 g/dL 12.6 (L)  HCT Latest  Ref Range: 39.0 - 52.0 % 39.9  MCV Latest Ref Range: 78.0 - 100.0 fL 89.1  MCH Latest Ref Range: 26.0 - 34.0 pg 28.1  MCHC Latest Ref Range: 30.0 - 36.0 g/dL 31.6  RDW Latest Ref Range: 11.5 - 15.5 % 12.9  Platelets Latest Ref Range: 150 - 400 K/uL 252  Neutrophils Latest Units: % 78  Lymphocytes Latest Units: % 15  Monocytes Relative Latest Units: % 5  Eosinophil Latest Units: % 1  Basophil Latest Units: % 0  NEUT# Latest Ref Range: 1.7 - 7.7 K/uL 4.8  Lymphocyte # Latest Ref Range: 0.7 - 4.0 K/uL 0.9  Monocyte # Latest Ref Range: 0.1 - 1.0 K/uL 0.3  Eosinophils Absolute Latest Ref Range: 0.0 - 0.7 K/uL  0.1  Basophils Absolute Latest Ref Range: 0.0 - 0.1 K/uL 0.0  Immature Granulocytes Latest Units: % 1  Abs Immature Granulocytes Latest Ref Range: 0.0 - 0.1 K/uL 0.0    IMAGES: CHEST - 2 VIEW 03/24/2018  COMPARISON:  Chest CT 03/10/2018  FINDINGS: Postsurgical changes from CABG.  The cardiac silhouette is enlarged. Mediastinal contours appear intact. Calcific atherosclerotic disease of the aorta.  There is no evidence of focal airspace consolidation, pleural effusion or pneumothorax. Low lung volumes.  Osseous structures are without acute abnormality. Soft tissues are grossly normal.  IMPRESSION: Enlarged cardiac silhouette.  Low lung volumes.  Calcific atherosclerotic disease of the aorta.   EKG: 07/13/2018: Sinus rhythm with 1st degree A-V block. Rightward axis. Anteroseptal infarct , age undetermined  CV: Cath 03/09/2018:  Difficult procedure from left radial approach due to difficulty with torque control.  Would advise femoral approach in the future.  Eccentric 60% proximal LAD.  Widely patent mid LAD stent.  Antegrade flow to the apex.  Widely patent left main.  Totally occluded ostial circumflex to proximal circumflex.  Proximal mid and distal RCA moderate disease with tandem 65 to 75% % distal segmental stenoses.  Patent saphenous vein graft to the second obtuse marginal which supplies the entire circumflex territory.  Widely patent LIMA to the distal segment of the LAD.  Competitive flow is noted.  Low normal LV systolic function with EF 50 to 55%.  Mildly elevated end-diastolic pressure.  Continuous sharp left chest pain not felt to be ischemia related.  RECOMMENDATIONS:   Consider musculoskeletal or neuropathic pain as a source of the patient's presentation.  Continue aggressive risk factor modification for secondary prevention of ischemic events.  If No Local complications, could be discharged later today.  TTE 06/08/2017: Study  Conclusions  - Left ventricle: The cavity size was normal. There was severe   concentric hypertrophy. Systolic function was normal. The   estimated ejection fraction was in the range of 60% to 65%. Wall   motion was normal; there were no regional wall motion   abnormalities. Doppler parameters are consistent with abnormal   left ventricular relaxation (grade 1 diastolic dysfunction).   There was no evidence of elevated ventricular filling pressure by   Doppler parameters. - Aortic valve: Trileaflet; normal thickness leaflets. There was no   regurgitation. - Aortic root: The aortic root was normal in size. - Mitral valve: There was no regurgitation. - Left atrium: The atrium was mildly dilated. - Right ventricle: Systolic function was normal. - Tricuspid valve: There was mild regurgitation. - Pulmonary arteries: Systolic pressure was within the normal   range. - Inferior vena cava: The vessel was not visualized. - Pericardium, extracardiac: There was no pericardial effusion Past Medical  History:  Diagnosis Date  . Adenomatous colon polyp 04/2011  . CAD (coronary artery disease)    a. 01/2015 DES to LAD  b. 12/2015: [place] 85% oLCx lesion--> rx therapy.  . Chronic diastolic CHF (congestive heart failure) ([place])    a. 05/2017 Echo: EF 60-65%, Gr1 DD, no rwma, mildly dil LA, nl RV fxn, mild TR.  . CKD (chronic kidney disease), stage II    a. probable CKD II-III with baseline CR 1.1-1.3.  . DM type 2 (diabetes mellitus, type 2), insulin dependent 01/29/2014   fasting cbg 50-120 with new regimen  . Dyslipidemia, goal LDL below 70 01/29/2014  . Enlarged prostate   . Essential tremor    a. on proprnolol  . GERD (gastroesophageal reflux disease)   . Hx of CABG   . Hypertension   . Internal hemorrhoid   . Sleep apnea    does not use cpap (06/04/2015)  . TIA (transient ischemic attack) 2002    Past Surgical History:  Procedure Laterality Date  . BACK SURGERY    . CARDIAC CATHETERIZATION   01/28/14   + CAD treat medically  . CARDIAC CATHETERIZATION N/A 12/21/2015   Procedure: Right/Left Heart Cath and Coronary Angiography;  Surgeon: Belva Crome, MD;  Location: [facility];  Service: Cardiovascular;  Laterality: N/A;  . CARDIAC CATHETERIZATION N/A 11/02/2016   Procedure: Left Heart Cath and Coronary Angiography;  Surgeon: Nelva Bush, MD;  Location: [facility];  Service: Cardiovascular;  Laterality: N/A;  . CATARACT EXTRACTION Bilateral   . CATARACT EXTRACTION, BILATERAL Bilateral   . CORONARY ANGIOPLASTY WITH STENT PLACEMENT  01/30/2015   DES [place]  [place] to LAD by Dr Tamala Julian  . CORONARY ARTERY BYPASS GRAFT N/A 11/09/2016   Procedure: CORONARY ARTERY BYPASS GRAFTING (CABG) x 2;  Surgeon: Melrose Nakayama, MD;  Location: [facility];  Service: Open Heart Surgery;  Laterality: N/A;  . ENDOVEIN HARVEST OF GREATER SAPHENOUS VEIN Left 11/09/2016   Procedure: ENDOVEIN HARVEST OF GREATER SAPHENOUS VEIN;  Surgeon: Melrose Nakayama, MD;  Location: [facility];  Service: Open Heart Surgery;  Laterality: Left;  . JOINT REPLACEMENT    . LEFT HEART CATH AND CORS/GRAFTS ANGIOGRAPHY N/A 03/09/2018   Procedure: LEFT HEART CATH AND CORS/GRAFTS ANGIOGRAPHY;  Surgeon: Belva Crome, MD;  Location: [facility];  Service: Cardiovascular;  Laterality: N/A;  . LEFT HEART CATHETERIZATION WITH CORONARY ANGIOGRAM N/A 01/28/2014   Procedure: LEFT HEART CATHETERIZATION WITH CORONARY ANGIOGRAM;  Surgeon: Sinclair Grooms, MD;  Location: [facility] CATH LAB;  Service: Cardiovascular;  Laterality: N/A;  . LEFT HEART CATHETERIZATION WITH CORONARY ANGIOGRAM N/A 01/30/2015   Procedure: LEFT HEART CATHETERIZATION WITH CORONARY ANGIOGRAM;  Surgeon: Belva Crome, MD;  Location: [facility] CATH LAB;  Service: Cardiovascular;  Laterality: N/A;  . LUMBAR [place]    . SHOULDER ARTHROSCOPY Right 07/30/2014   Procedure: Right Shoulder Arthroscopy, Debridement, Decompression, Manipulation Under Anesthesia;  Surgeon:  Newt Minion, MD;  Location: [facility];  Service: Orthopedics;  Laterality: Right;  . TEE WITHOUT CARDIOVERSION N/A 11/09/2016   Procedure: TRANSESOPHAGEAL ECHOCARDIOGRAM (TEE);  Surgeon: Melrose Nakayama, MD;  Location: [place];  Service: Open Heart Surgery;  Laterality: N/A;  . TOTAL KNEE ARTHROPLASTY Bilateral     MEDICATIONS: No current facility-administered medications for this encounter.    Marland Kitchen albuterol (VENTOLIN HFA) 108 (90 Base) MCG/ACT inhaler  . amLODipine (NORVASC) 10 MG tablet  . aspirin EC 81 MG tablet  . bismuth  subsalicylate (PEPTO BISMOL) 262 MG/15ML suspension  . clonazePAM (KLONOPIN) 1 MG tablet  . clopidogrel (PLAVIX) 75 MG tablet  . cyclobenzaprine (FLEXERIL) 5 MG tablet  . diclofenac sodium (VOLTAREN) 1 % GEL  . dicyclomine (BENTYL) 10 MG capsule  . erythromycin ophthalmic ointment  . esomeprazole (NEXIUM) 40 MG capsule  . furosemide (LASIX) 40 MG tablet  . gabapentin (NEURONTIN) 300 MG capsule  . hydrALAZINE (APRESOLINE) 25 MG tablet  . insulin aspart (NOVOLOG) 100 UNIT/ML injection  . insulin NPH Human (NOVOLIN N RELION) 100 UNIT/ML injection  . Insulin Syringe-Needle U-100 (BD INSULIN SYRINGE U/F) 31G X 5/16" 1 ML MISC  . meclizine (ANTIVERT) 25 MG tablet  . nitroGLYCERIN (NITROSTAT) 0.4 MG SL tablet  . oxyCODONE-acetaminophen (PERCOCET/ROXICET) 5-325 MG tablet  . polyethylene glycol (MIRALAX / GLYCOLAX) packet  . Polyvinyl Alcohol-Povidone (REFRESH OP)  . potassium chloride SA (K-DUR,KLOR-CON) 20 MEQ tablet  . prednisoLONE acetate (PRED FORTE) 1 % ophthalmic suspension  . primidone (MYSOLINE) 50 MG tablet  . propranolol (INDERAL) 40 MG tablet  . rosuvastatin (CRESTOR) 20 MG tablet  . senna-docusate (SENOKOT-S) 8.6-50 MG tablet    Wynonia Musty [facility] Short Stay Center/Anesthesiology Phone [phone] 07/13/2018 2:05 PM

## 2018-07-13 NOTE — ED Notes (Signed)
I called mri and was told that they would be coming to get this pt in 15 minutes  That was 10 MINUTES AGO  I gave the pt his ativan  No one came  I called back  I was reassured that someone was coming to get him

## 2018-07-13 NOTE — ED Notes (Signed)
The  Pt asking how long before mri

## 2018-07-13 NOTE — ED Triage Notes (Addendum)
Patient presents to ed via [facility] c/o dizziness this am states thinks he took his insulin x 2 last pm and also took a oxycodone for chronic neck pain of which he is having surgery on Monday. States he got up to go to the bathroom this am and fell, he called his brother and brother called 5. Speak is normal. Patient is currently alert and oriented.  Ems states patient's CBG was 61 he was able to eat something and drink some juice. CBG 87

## 2018-07-13 NOTE — ED Notes (Signed)
Called MRI spoke with Edward [name] they are to call 15 mins prior to MRI for medication

## 2018-07-13 NOTE — ED Provider Notes (Signed)
[facility] EMERGENCY DEPARTMENT Provider Note   CSN: [id number] Arrival date & time: 07/13/18  1111     History   Chief Complaint Chief Complaint  Patient presents with  . Dizziness  . Altered Mental Status    HPI Edward [name] is a 77 y.o. male.  HPI   77 year old male with a history of coronary artery disease post CABG, chronic diastolic CHF, CKD, diabetes, hypertension, with recent admission for concern of neck pain and right arm and leg weakness, with negative MRI brain, CT angio, found to have cervical spondylosis with radiculopathy and myelopathy and plan for neurosurgery on 9/16 who presents with concern for episode of dizziness when he woke up this morning.  Patient reports last night he was having neck pain, and took an oxycodone before bed.  Reports he also believes he could have taken his insulin twice.  Reports when he woke up this morning at 8 AM, he felt dizziness like the room was spinning, and like he was going to pass out.  Reports he slid out of bed, and made his way to the door hanging onto the handle.  He denies any new numbness, weakness, visual changes, trouble speaking, or other concerns.  Reports his glucose was 60 which is low for him. Ate oatmeal with sugar and now feels better. Denies continuing dizziness. Wondering if he will still be scheduled for surgery. No fevers, no chest pain or other concerns.     Past Medical History:  Diagnosis Date  . Adenomatous colon polyp 04/2011  . CAD (coronary artery disease)    a. 01/2015 DES to LAD  b. 12/2015: [place] 85% oLCx lesion--> rx therapy.  . Chronic diastolic CHF (congestive heart failure) ([place])    a. 05/2017 Echo: EF 60-65%, Gr1 DD, no rwma, mildly dil LA, nl RV fxn, mild TR.  . CKD (chronic kidney disease), stage II    a. probable CKD II-III with baseline CR 1.1-1.3.  . DM type 2 (diabetes mellitus, type 2), insulin dependent 01/29/2014   fasting cbg 50-120 with new regimen  . Dyslipidemia, goal  LDL below 70 01/29/2014  . Enlarged prostate   . Essential tremor    a. on proprnolol  . GERD (gastroesophageal reflux disease)   . Hx of CABG   . Hypertension   . Internal hemorrhoid   . Sleep apnea    does not use cpap (06/04/2015)  . TIA (transient ischemic attack) 2002    Patient Active Problem List   Diagnosis Date Noted  . Cervical spinal stenosis 07/10/2018  . Cervical radiculopathy 07/10/2018  . Cellulitis of left anterior lower leg 06/19/2018  . Numbness and tingling in both hands 06/19/2018  . Chronic daily headache 04/23/2018  . Palpitations 03/28/2018  . Anxiety 07/24/2017  . Atelectasis   . Coronary artery disease involving coronary bypass graft of native heart with angina pectoris ([place])   . Hypertensive heart disease without heart failure   . Chronic kidney disease, stage III (moderate) ([place]) 11/03/2016  . Chronic diastolic heart failure ([place])   . Near syncope 04/19/2016  . Diabetes mellitus ([place]) 03/11/2016  . GERD (gastroesophageal reflux disease) 08/27/2015  . Acute bronchitis 08/27/2015  . SOB (shortness of breath) 07/08/2015  . Obstructive sleep apnea 06/22/2015  . Essential tremor 04/23/2015  . Impacted cerumen of right ear 11/20/2014  . Hyperlipidemia LDL goal <70 01/29/2014  . Essential hypertension 01/29/2014    Past Surgical History:  Procedure Laterality Date  . BACK SURGERY    .  CARDIAC CATHETERIZATION  01/28/14   + CAD treat medically  . CARDIAC CATHETERIZATION N/A 12/21/2015   Procedure: Right/Left Heart Cath and Coronary Angiography;  Surgeon: Belva Crome, MD;  Location: [facility];  Service: Cardiovascular;  Laterality: N/A;  . CARDIAC CATHETERIZATION N/A 11/02/2016   Procedure: Left Heart Cath and Coronary Angiography;  Surgeon: Nelva Bush, MD;  Location: [facility];  Service: Cardiovascular;  Laterality: N/A;  . CATARACT EXTRACTION Bilateral   . CATARACT EXTRACTION, BILATERAL Bilateral   . CORONARY ANGIOPLASTY WITH STENT  PLACEMENT  01/30/2015   DES [place]  [place] to LAD by Dr Tamala Julian  . CORONARY ARTERY BYPASS GRAFT N/A 11/09/2016   Procedure: CORONARY ARTERY BYPASS GRAFTING (CABG) x 2;  Surgeon: Melrose Nakayama, MD;  Location: [facility];  Service: Open Heart Surgery;  Laterality: N/A;  . ENDOVEIN HARVEST OF GREATER SAPHENOUS VEIN Left 11/09/2016   Procedure: ENDOVEIN HARVEST OF GREATER SAPHENOUS VEIN;  Surgeon: Melrose Nakayama, MD;  Location: [facility];  Service: Open Heart Surgery;  Laterality: Left;  . JOINT REPLACEMENT    . LEFT HEART CATH AND CORS/GRAFTS ANGIOGRAPHY N/A 03/09/2018   Procedure: LEFT HEART CATH AND CORS/GRAFTS ANGIOGRAPHY;  Surgeon: Belva Crome, MD;  Location: [facility];  Service: Cardiovascular;  Laterality: N/A;  . LEFT HEART CATHETERIZATION WITH CORONARY ANGIOGRAM N/A 01/28/2014   Procedure: LEFT HEART CATHETERIZATION WITH CORONARY ANGIOGRAM;  Surgeon: Sinclair Grooms, MD;  Location: [facility] CATH LAB;  Service: Cardiovascular;  Laterality: N/A;  . LEFT HEART CATHETERIZATION WITH CORONARY ANGIOGRAM N/A 01/30/2015   Procedure: LEFT HEART CATHETERIZATION WITH CORONARY ANGIOGRAM;  Surgeon: Belva Crome, MD;  Location: [facility] CATH LAB;  Service: Cardiovascular;  Laterality: N/A;  . LUMBAR [place]    . SHOULDER ARTHROSCOPY Right 07/30/2014   Procedure: Right Shoulder Arthroscopy, Debridement, Decompression, Manipulation Under Anesthesia;  Surgeon: Newt Minion, MD;  Location: [facility];  Service: Orthopedics;  Laterality: Right;  . TEE WITHOUT CARDIOVERSION N/A 11/09/2016   Procedure: TRANSESOPHAGEAL ECHOCARDIOGRAM (TEE);  Surgeon: Melrose Nakayama, MD;  Location: [facility];  Service: Open Heart Surgery;  Laterality: N/A;  . TOTAL KNEE ARTHROPLASTY Bilateral         Home Medications    Prior to Admission medications   Medication Sig Start Date End Date Taking? Authorizing Provider  amLODipine (NORVASC) 10 MG tablet Take 1 tablet (10 mg total) by mouth daily. 03/13/18  Yes Roma Schanz R, DO  bismuth subsalicylate (PEPTO BISMOL) 262 MG/15ML suspension Take 30 mLs by mouth as needed for indigestion.   Yes [provider]  clonazePAM (KLONOPIN) 1 MG tablet Take 1 tablet (1 mg total) by mouth at bedtime. Patient taking differently: Take 1 mg by mouth at bedtime as needed (for sleep or anxiety).  03/13/18  Yes Ann Held, DO  cyclobenzaprine (FLEXERIL) 5 MG tablet Take 1 tablet (5 mg total) by mouth at bedtime as needed for muscle spasms (headache). Patient taking differently: Take 5 mg by mouth at bedtime as needed (for muscle spasms, leg cramps, or headaches).  04/23/18  Yes Patel, Donika K, DO  diclofenac sodium (VOLTAREN) 1 % GEL Apply 2 g topically 4 (four) times daily as needed (for pain).    Yes [provider]  dicyclomine (BENTYL) 10 MG capsule Take 10 mg by mouth 2 (two) times daily.  01/01/16  Yes [provider]  erythromycin ophthalmic ointment Place 1 application into both eyes See admin  instructions. Apply to both eyelids once a day as needed for itching   Yes [provider]  esomeprazole (NEXIUM) 40 MG capsule Take 40 mg by mouth daily as needed (for reflux).  01/01/16  Yes [provider]  furosemide (LASIX) 40 MG tablet Take 1.5 tablets (60 mg total) by mouth daily. 03/29/18 07/13/18 Yes Duke, Tami Lin, PA  gabapentin (NEURONTIN) 300 MG capsule take 1 capsule by mouth at bedtime for 1 week then INCREASE TO 1 CAP TWICE DAILY Patient taking differently: Take 300 mg by mouth 2 (two) times daily.  03/13/18  Yes Roma Schanz R, DO  hydrALAZINE (APRESOLINE) 25 MG tablet Take 1 tablet (25 mg total) by mouth 3 (three) times daily. 03/28/18 07/13/18 Yes Duke, Tami Lin, PA  insulin aspart (NOVOLOG) 100 UNIT/ML injection Inject 40 Units into the skin 3 (three) times daily before meals. 06/25/18  Yes Philemon Kingdom, MD  insulin NPH Human (NOVOLIN N RELION) 100 UNIT/ML injection Inject 30 Units into the skin 2  (two) times daily before a meal.    Yes [provider]  meclizine (ANTIVERT) 25 MG tablet Take 1 tablet (25 mg total) by mouth 2 (two) times daily as needed for dizziness. 06/10/17  Yes Theora Gianotti, NP  nitroGLYCERIN (NITROSTAT) 0.4 MG SL tablet Place 1 tablet (0.4 mg total) under the tongue every 5 (five) minutes as needed for chest pain. Max: 3 doses per episode. Patient taking differently: Place 0.4-0.8 mg under the tongue every 5 (five) minutes x 3 doses as needed for chest pain.  11/07/17  Yes Burtis Junes, NP  oxyCODONE-acetaminophen (PERCOCET/ROXICET) 5-325 MG tablet Take 1-2 tablets by mouth every 4 (four) hours as needed for moderate pain or severe pain. 07/11/18  Yes Hosie Poisson, MD  polyethylene glycol (MIRALAX / GLYCOLAX) packet Take 17 g by mouth daily. Patient taking differently: Take 17 g by mouth daily as needed for mild constipation.  07/12/18  Yes Hosie Poisson, MD  Polyvinyl Alcohol-Povidone (REFRESH OP) Place 1 drop into both eyes 3 (three) times daily as needed (for dry eyes). Reported on 12/25/2015   Yes [provider]  potassium chloride SA (K-DUR,KLOR-CON) 20 MEQ tablet TAKE 1 & 1/2 (ONE & ONE-HALF) TABLETS BY MOUTH ONCE DAILY Patient taking differently: Take 30 mEq by mouth daily.  04/27/18  Yes Duke, Tami Lin, PA  prednisoLONE acetate (PRED FORTE) 1 % ophthalmic suspension Place 2 drops into both eyes 2 (two) times daily as needed for itching.   Yes [provider]  primidone (MYSOLINE) 50 MG tablet Take 1 tablet (50 mg total) by mouth 2 (two) times daily. 04/23/18  Yes Patel, Donika K, DO  propranolol (INDERAL) 40 MG tablet Take 1 tablet (40 mg total) by mouth 3 (three) times daily. 03/13/18  Yes Roma Schanz R, DO  rosuvastatin (CRESTOR) 20 MG tablet Take 1 tablet (20 mg total) by mouth daily. Patient taking differently: Take 20 mg by mouth 2 (two) times a week.  03/13/18 03/08/19 Yes Ann Held, DO  albuterol  (VENTOLIN HFA) 108 (90 Base) MCG/ACT inhaler inhale 2 puffs by mouth every 6 hours if needed for wheezing or shortness of breath Patient not taking: Reported on 07/13/2018 07/24/17   Ann Held, DO  aspirin EC 81 MG tablet Take 1 tablet (81 mg total) by mouth daily. Hold aspirin for 5 days for the surgery, restart it as per neuro surgery. 07/11/18   Hosie Poisson, MD  clopidogrel (PLAVIX)  75 MG tablet Take 1 tablet (75 mg total) by mouth daily. Hold plavix for the surgery, restart it as per neuro surgery. 07/11/18   Hosie Poisson, MD  Insulin Syringe-Needle U-100 (BD INSULIN SYRINGE U/F) 31G X 5/16" 1 ML MISC Use 4 times per day to inject insulin. 01/16/18   Philemon Kingdom, MD  senna-docusate (SENOKOT-S) 8.6-50 MG tablet Take 2 tablets by mouth 2 (two) times daily. 07/12/18   Hosie Poisson, MD    Family History Family History  Problem Relation Age of Onset  . CAD Brother        2 brothers - CABG  . Alzheimer's disease Sister   . CAD Sister   . Prostate cancer Brother   . Asthma Brother        2 brothers   . Diabetes Other        entire family  . Colon cancer Neg Hx     Social History Social History   Tobacco Use  . Smoking status: Never Smoker  . Smokeless tobacco: Never Used  Substance Use Topics  . Alcohol use: No    Alcohol/week: 0.0 standard drinks  . Drug use: No     Allergies   Pneumococcal vaccines   Review of Systems Review of Systems  Constitutional: Negative for fever.  HENT: Negative for sore throat.   Eyes: Negative for visual disturbance.  Respiratory: Negative for shortness of breath.   Cardiovascular: Negative for chest pain.  Gastrointestinal: Negative for abdominal pain, nausea and vomiting.  Genitourinary: Negative for difficulty urinating.  Musculoskeletal: Positive for gait problem. Negative for back pain and neck stiffness.  Skin: Negative for rash.  Neurological: Positive for dizziness and light-headedness. Negative for syncope,  weakness (no change), numbness and headaches.     Physical Exam Updated Vital Signs BP (!) 177/56 (BP Location: Left Arm)   Pulse 66   Temp (!) 97.5 F (36.4 C) (Oral)   Resp 16   Ht 5\' 8"  (1.727 m)   Wt 127 kg   SpO2 99%   BMI 42.57 kg/m   Physical Exam  Constitutional: He is oriented to person, place, and time. He appears well-developed and well-nourished. No distress.  HENT:  Head: Normocephalic and atraumatic.  Eyes: Conjunctivae and EOM are normal.  Neck: Normal range of motion.  Cardiovascular: Normal rate, regular rhythm, normal heart sounds and intact distal pulses. Exam reveals no gallop and no friction rub.  No murmur heard. Pulmonary/Chest: Effort normal and breath sounds normal. No respiratory distress. He has no wheezes. He has no rales.  Abdominal: Soft. He exhibits no distension. There is no tenderness. There is no guarding.  Musculoskeletal: He exhibits no edema.  Neurological: He is alert and oriented to person, place, and time.  Mild right upper ext weakness, mild right lower ext weakness (pt reports unchanged) , normal finger to nose and heel to shin with exception of mild weakness  Skin: Skin is warm and dry. He is not diaphoretic.  Nursing note and vitals reviewed.    ED Treatments / Results  Labs (all labs ordered are listed, but only abnormal results are displayed) Labs Reviewed  CBC WITH DIFFERENTIAL/PLATELET - Abnormal; Notable for the following components:      Result Value   Hemoglobin 12.6 (*)    All other components within normal limits  COMPREHENSIVE METABOLIC PANEL - Abnormal; Notable for the following components:   Glucose, Bld 245 (*)    Creatinine, Ser 1.25 (*)    Calcium 8.5 (*)  Total Protein 5.9 (*)    Albumin 3.1 (*)    GFR calc non Af Amer 54 (*)    All other components within normal limits  URINALYSIS, ROUTINE W REFLEX MICROSCOPIC - Abnormal; Notable for the following components:   Protein, ur 30 (*)    Bacteria, UA RARE  (*)    All other components within normal limits  CBG MONITORING, ED - Abnormal; Notable for the following components:   Glucose-Capillary 138 (*)    All other components within normal limits    EKG EKG Interpretation  Date/Time:  Friday July 13 2018 11:31:09 EDT Ventricular Rate:  67 PR Interval:  278 QRS Duration: 90 QT Interval:  442 QTC Calculation: 467 R Axis:   94 Text Interpretation:  Sinus rhythm with 1st degree A-V block Rightward axis Anteroseptal infarct , age undetermined Abnormal ECG No significant change since last tracing Confirmed by Gareth Morgan [phone]) on 07/13/2018 1:30:09 PM   Radiology No results found.  Procedures Procedures (including critical care time)  Medications Ordered in ED Medications  diazepam (VALIUM) tablet 5 mg (5 mg Oral Not Given 07/13/18 1410)  meclizine (ANTIVERT) tablet 25 mg (25 mg Oral Given 07/13/18 1405)     Initial Impression / Assessment and Plan / ED Course  I have reviewed the triage vital signs and the nursing notes.  Pertinent labs & imaging results that were available during my care of the patient were reviewed by me and considered in my medical decision making (see chart for details).     77 year old male with a history of coronary artery disease post CABG, chronic diastolic CHF, CKD, diabetes, hypertension, with recent admission for concern of neck pain and right arm and leg weakness, with negative MRI brain, CT angio, found to have cervical spondylosis with radiculopathy and myelopathy and plan for neurosurgery on 9/16 who presents with concern for episode of dizziness when he woke up this morning.  EKG without acute changes. Labs show no significant anemia or electrolyte abnormalities.  Denies other new neurologic symptoms with episode, and has had recent MR/CTA for current right sided weakness found to be secondary to cervical spine pathology.   It is possible symptoms were secondary to medication, possibly  hypoglycemia as pt reports he may have taken his insulin twice last night. (Reports 60 is low for him.)  No chest pain or dyspnea.   On reevaluation, patient reports continued dizziness with movement.  Will order meclizine, obtain MRI to evaluate for central etiology given new development of dizziness and risk factors for CVA.   If MRI negative, may discharge with meclizine.   Final Clinical Impressions(s) / ED Diagnoses   Final diagnoses:  Dizziness    ED Discharge Orders    None       Gareth Morgan, MD 07/13/18 1421

## 2018-07-13 NOTE — ED Notes (Signed)
The pt reports ore dizziness and he thinks his blood sugar was low    His blood sugar was not low

## 2018-07-13 NOTE — ED Notes (Signed)
Pt provided w/ sandwich and beverage. OK per MD [place]. Pt assisted to sit up in bed and eat. Denies additional needs at this time.

## 2018-07-14 ENCOUNTER — Encounter (HOSPITAL_COMMUNITY): Payer: Self-pay | Admitting: *Deleted

## 2018-07-14 ENCOUNTER — Other Ambulatory Visit: Payer: Self-pay

## 2018-07-14 DIAGNOSIS — G4733 Obstructive sleep apnea (adult) (pediatric): Secondary | ICD-10-CM

## 2018-07-14 DIAGNOSIS — I10 Essential (primary) hypertension: Secondary | ICD-10-CM

## 2018-07-14 DIAGNOSIS — G250 Essential tremor: Secondary | ICD-10-CM

## 2018-07-14 DIAGNOSIS — K219 Gastro-esophageal reflux disease without esophagitis: Secondary | ICD-10-CM

## 2018-07-14 LAB — GLUCOSE, CAPILLARY
GLUCOSE-CAPILLARY: 490 mg/dL — AB (ref 70–99)
GLUCOSE-CAPILLARY: 467 mg/dL — AB (ref 70–99)
GLUCOSE-CAPILLARY: 385 mg/dL — AB (ref 70–99)
GLUCOSE-CAPILLARY: 365 mg/dL — AB (ref 70–99)
GLUCOSE-CAPILLARY: 359 mg/dL — AB (ref 70–99)
Glucose-Capillary: 501 mg/dL (ref 70–99)

## 2018-07-14 LAB — MRSA PCR SCREENING: MRSA by PCR: NEGATIVE

## 2018-07-14 MED ORDER — INSULIN ASPART 100 UNIT/ML ~~LOC~~ SOLN
20.0000 [IU] | Freq: Once | SUBCUTANEOUS | Status: AC
  Administered 2018-07-14: 20 [IU] via SUBCUTANEOUS

## 2018-07-14 MED ORDER — PRIMIDONE 50 MG PO TABS
50.0000 mg | ORAL_TABLET | Freq: Two times a day (BID) | ORAL | Status: DC
  Administered 2018-07-14 – 2018-07-19 (×11): 50 mg via ORAL
  Filled 2018-07-14 (×15): qty 1

## 2018-07-14 MED ORDER — CLONAZEPAM 1 MG PO TABS
1.0000 mg | ORAL_TABLET | Freq: Every evening | ORAL | Status: DC | PRN
  Administered 2018-07-14: 1 mg via ORAL
  Filled 2018-07-14 (×2): qty 1

## 2018-07-14 MED ORDER — PREDNISONE 50 MG PO TABS
50.0000 mg | ORAL_TABLET | Freq: Two times a day (BID) | ORAL | Status: DC
  Administered 2018-07-14: 50 mg via ORAL
  Filled 2018-07-14: qty 1

## 2018-07-14 MED ORDER — SENNOSIDES-DOCUSATE SODIUM 8.6-50 MG PO TABS
2.0000 | ORAL_TABLET | Freq: Two times a day (BID) | ORAL | Status: DC
  Administered 2018-07-14 – 2018-07-28 (×20): 2 via ORAL
  Filled 2018-07-14 (×23): qty 2

## 2018-07-14 MED ORDER — ROSUVASTATIN CALCIUM 20 MG PO TABS
20.0000 mg | ORAL_TABLET | ORAL | Status: DC
  Administered 2018-07-17: 20 mg via ORAL
  Filled 2018-07-14: qty 1

## 2018-07-14 MED ORDER — INSULIN ASPART 100 UNIT/ML ~~LOC~~ SOLN
0.0000 [IU] | Freq: Three times a day (TID) | SUBCUTANEOUS | Status: DC
  Administered 2018-07-14 (×2): 20 [IU] via SUBCUTANEOUS
  Administered 2018-07-15: 4 [IU] via SUBCUTANEOUS
  Administered 2018-07-15: 11 [IU] via SUBCUTANEOUS
  Administered 2018-07-15 – 2018-07-17 (×3): 7 [IU] via SUBCUTANEOUS

## 2018-07-14 MED ORDER — POLYETHYLENE GLYCOL 3350 17 G PO PACK
17.0000 g | PACK | Freq: Every day | ORAL | Status: DC
  Administered 2018-07-14 – 2018-07-28 (×8): 17 g via ORAL
  Filled 2018-07-14 (×10): qty 1

## 2018-07-14 MED ORDER — INSULIN ASPART 100 UNIT/ML ~~LOC~~ SOLN
0.0000 [IU] | Freq: Every day | SUBCUTANEOUS | Status: DC
  Administered 2018-07-14: 5 [IU] via SUBCUTANEOUS
  Administered 2018-07-15: 2 [IU] via SUBCUTANEOUS

## 2018-07-14 MED ORDER — ERYTHROMYCIN 5 MG/GM OP OINT: 1.0000 "application " | TOPICAL_OINTMENT | Freq: Every day | OPHTHALMIC | Status: DC | PRN

## 2018-07-14 MED ORDER — OXYCODONE-ACETAMINOPHEN 5-325 MG PO TABS
1.0000 | ORAL_TABLET | ORAL | Status: DC | PRN
  Administered 2018-07-14 – 2018-07-18 (×8): 2 via ORAL
  Administered 2018-07-18: 1 via ORAL
  Filled 2018-07-14 (×3): qty 2
  Filled 2018-07-14: qty 1
  Filled 2018-07-14 (×5): qty 2

## 2018-07-14 MED ORDER — PANTOPRAZOLE SODIUM 40 MG PO TBEC
40.0000 mg | DELAYED_RELEASE_TABLET | Freq: Every day | ORAL | Status: DC
  Administered 2018-07-14 – 2018-07-19 (×5): 40 mg via ORAL
  Filled 2018-07-14 (×5): qty 1

## 2018-07-14 MED ORDER — HYDRALAZINE HCL 25 MG PO TABS
25.0000 mg | ORAL_TABLET | Freq: Three times a day (TID) | ORAL | Status: DC
  Administered 2018-07-14 – 2018-07-19 (×15): 25 mg via ORAL
  Filled 2018-07-14 (×18): qty 1

## 2018-07-14 MED ORDER — GABAPENTIN 300 MG PO CAPS
300.0000 mg | ORAL_CAPSULE | Freq: Two times a day (BID) | ORAL | Status: DC
  Administered 2018-07-14 – 2018-07-19 (×10): 300 mg via ORAL
  Filled 2018-07-14 (×10): qty 1

## 2018-07-14 MED ORDER — PREDNISOLONE ACETATE 1 % OP SUSP
2.0000 [drp] | Freq: Three times a day (TID) | OPHTHALMIC | Status: DC
  Administered 2018-07-14 – 2018-08-07 (×84): 2 [drp] via OPHTHALMIC
  Filled 2018-07-14 (×3): qty 5

## 2018-07-14 MED ORDER — CYCLOBENZAPRINE HCL 10 MG PO TABS
5.0000 mg | ORAL_TABLET | Freq: Every evening | ORAL | Status: DC | PRN
  Administered 2018-07-16 – 2018-07-19 (×2): 5 mg via ORAL
  Filled 2018-07-14 (×3): qty 1

## 2018-07-14 MED ORDER — AMLODIPINE BESYLATE 10 MG PO TABS
10.0000 mg | ORAL_TABLET | Freq: Every day | ORAL | Status: DC
  Administered 2018-07-14 – 2018-07-19 (×5): 10 mg via ORAL
  Filled 2018-07-14 (×5): qty 1

## 2018-07-14 MED ORDER — INSULIN ASPART 100 UNIT/ML ~~LOC~~ SOLN: 0.0000 [IU] | Freq: Three times a day (TID) | SUBCUTANEOUS | Status: DC

## 2018-07-14 MED ORDER — DICYCLOMINE HCL 10 MG PO CAPS
10.0000 mg | ORAL_CAPSULE | Freq: Two times a day (BID) | ORAL | Status: DC
  Administered 2018-07-14 – 2018-07-19 (×11): 10 mg via ORAL
  Filled 2018-07-14 (×14): qty 1

## 2018-07-14 MED ORDER — CLOPIDOGREL BISULFATE 75 MG PO TABS
75.0000 mg | ORAL_TABLET | Freq: Every day | ORAL | Status: DC
  Filled 2018-07-14: qty 1

## 2018-07-14 MED ORDER — DICLOFENAC SODIUM 1 % TD GEL: 2.0000 g | Freq: Four times a day (QID) | TRANSDERMAL | Status: DC | PRN

## 2018-07-14 MED ORDER — MECLIZINE HCL 12.5 MG PO TABS
25.0000 mg | ORAL_TABLET | Freq: Two times a day (BID) | ORAL | Status: DC | PRN
  Filled 2018-07-14: qty 1

## 2018-07-14 MED ORDER — POTASSIUM CHLORIDE CRYS ER 20 MEQ PO TBCR
30.0000 meq | EXTENDED_RELEASE_TABLET | Freq: Every day | ORAL | Status: DC
  Administered 2018-07-14 – 2018-07-19 (×5): 30 meq via ORAL
  Filled 2018-07-14 (×6): qty 1

## 2018-07-14 MED ORDER — INSULIN ASPART 100 UNIT/ML ~~LOC~~ SOLN: 40.0000 [IU] | Freq: Three times a day (TID) | SUBCUTANEOUS | Status: DC

## 2018-07-14 MED ORDER — INSULIN ASPART 100 UNIT/ML ~~LOC~~ SOLN
30.0000 [IU] | Freq: Three times a day (TID) | SUBCUTANEOUS | Status: DC
  Administered 2018-07-14 – 2018-07-17 (×7): 30 [IU] via SUBCUTANEOUS

## 2018-07-14 MED ORDER — INSULIN ASPART 100 UNIT/ML ~~LOC~~ SOLN
20.0000 [IU] | Freq: Three times a day (TID) | SUBCUTANEOUS | Status: DC
  Administered 2018-07-14: 20 [IU] via SUBCUTANEOUS

## 2018-07-14 MED ORDER — PROPRANOLOL HCL 40 MG PO TABS
40.0000 mg | ORAL_TABLET | Freq: Three times a day (TID) | ORAL | Status: DC
  Administered 2018-07-14 – 2018-07-19 (×11): 40 mg via ORAL
  Filled 2018-07-14 (×19): qty 1

## 2018-07-14 MED ORDER — INSULIN DETEMIR 100 UNIT/ML ~~LOC~~ SOLN
40.0000 [IU] | Freq: Two times a day (BID) | SUBCUTANEOUS | Status: DC
  Administered 2018-07-14 – 2018-07-16 (×5): 40 [IU] via SUBCUTANEOUS
  Administered 2018-07-17: 20 [IU] via SUBCUTANEOUS
  Administered 2018-07-17: 40 [IU] via SUBCUTANEOUS
  Filled 2018-07-14 (×10): qty 0.4

## 2018-07-14 MED ORDER — FUROSEMIDE 20 MG PO TABS
60.0000 mg | ORAL_TABLET | Freq: Every day | ORAL | Status: DC
  Administered 2018-07-14 – 2018-07-19 (×5): 60 mg via ORAL
  Filled 2018-07-14 (×5): qty 1

## 2018-07-14 MED ORDER — NITROGLYCERIN 0.4 MG SL SUBL: 0.4000 mg | SUBLINGUAL_TABLET | SUBLINGUAL | Status: DC | PRN

## 2018-07-14 MED ORDER — BISMUTH SUBSALICYLATE 262 MG/15ML PO SUSP: 30.0000 mL | Freq: Two times a day (BID) | ORAL | Status: DC | PRN

## 2018-07-14 MED ORDER — INSULIN NPH (HUMAN) (ISOPHANE) 100 UNIT/ML ~~LOC~~ SUSP
30.0000 [IU] | Freq: Two times a day (BID) | SUBCUTANEOUS | Status: DC
  Filled 2018-07-14: qty 10

## 2018-07-14 NOTE — Progress Notes (Signed)
PROGRESS NOTE  Brief Narrative: Edward [name] is a right-handed 77 y.o. male with a history of CAD s/p CABG, chronic HFpEF, IDDM, OA s/p multiple joint replacements who presented to the ED for neck pain and dizziness 1 day after discharge. He had had 1 week of worsening, constant, severe L>R neck pain with associated numbness and weakness of the right upper extremity and found to have advanced cervical spondylosis with cord compression. Neurosurgery was consulted, recommended discharge home prior to surgery planned 9/16. Unfortunately he stayed in bed all day due to symptoms and returned on the advise of his brother. On reevaluation he had stable vital signs and labs, started on IV steroids.  Subjective: Pain is improved with medications, still some numbness. Had constipation but large BM at about 2:00am today. Blood sugars have been severely elevated this morning.  Objective: BP (!) 179/64 (BP Location: Left Arm)   Pulse 89   Temp 97.9 F (36.6 C) (Oral)   Resp 16   Ht 5\' 8"  (1.727 m)   Wt 127 kg   SpO2 100%   BMI 42.57 kg/m   Gen: 77 y.o. male in no distress Pulm: Nonlabored breathing room air. Clear. CV: Regular rate and rhythm. No murmur, rub, or gallop. No JVD, no dependent edema. GI: Abdomen soft, non-tender, non-distended, with normoactive bowel sounds.  MSK: Diminished ROM in neck, midline cervical tenderness and paraspinal spasm.  Skin: No rashes, lesions or ulcers on visualized skin.  Neuro: Alert and oriented. RUE with 4/5 strength, abnormal sensory exam. No other focal neurological deficits. Psych: Judgement and insight appear fair. Mood euthymic & affect congruent. Behavior is appropriate.    Assessment & Plan: Cervical spondylosis with cord compression, radiculopathy:  - On the schedule for neurosurgery 9/16. - Continue pain control as ordered. Will stop steroids unless neurosurgery feels this is necessary as it is causing significant hyperglycemia.   IDDM: Severe  hyperglycemia in response to steroids.  - Change insulin to levemir, 40 u BID (up from NPH 30u BID) and start novolog 30u TIDWC + resistant SSI and HS correction. Had to give 20u and another 20u this morning due to hyperglycemia, will monitor closely.  CAD, chronic HFpEF: No recent anginal symptoms or symptoms related to volume overload. Was evaluated by cardiology last admission with no further work up requested. Euvolemic - Continue lasix, hydralazine, propranolol, norvasc  Constipation: Resolved.  - Continue bowel regimen due to need for opioids  Stage II CKD: Cr at baseline with CrCl >57ml/min.   HTN:  - Continue home medications  Hyperlipidemia:  - Continue statin  GERD:  - Continue PPI  OSA:  - CPAP qHS  Edward Pour, MD 07/14/2018, 11:23 AM

## 2018-07-14 NOTE — Progress Notes (Addendum)
0800: RN notified MD that pts CBG at 0800 was 501.  1200: RN paged MD for pts CBG in the 460s. MD ordered for RN to administer 50u of Novolog and recheck in 1 hour. Pt currently is not experiencing any symptoms from high blood sugars. Will continue to monitor.   Riley Kill RN

## 2018-07-14 NOTE — Progress Notes (Signed)
Discussed surgical plan with patient, he would like to proceed. Longer-term pre-operative glucose control would be better to minimize post-operative complications. However, given that he is having progressive weakness over the past few weeks, we should proceed with surgery to prevent further neurologic deficit. -okay with stopping steroids -placed order to discontinue plavix, patient has not yet received a dose, continue to hold all anti-platelet agents until the post-op period -greatly appreciate care and pre-operative optimization by Dr. Bonner Puna and the hospitalist service

## 2018-07-15 LAB — GLUCOSE, CAPILLARY
GLUCOSE-CAPILLARY: 220 mg/dL — AB (ref 70–99)
GLUCOSE-CAPILLARY: 262 mg/dL — AB (ref 70–99)
Glucose-Capillary: 198 mg/dL — ABNORMAL HIGH (ref 70–99)
Glucose-Capillary: 213 mg/dL — ABNORMAL HIGH (ref 70–99)

## 2018-07-15 NOTE — Progress Notes (Signed)
PROGRESS NOTE  Edward [name] YQM:[id number] DOB: 09-18-1941 DOA: 07/13/2018 PCP: Ann Held, DO  HPI/Recap of past 24 hours: Edward [name] is a right-handed 77 y.o. male with a history of CAD s/p CABG, chronic HFpEF, IDDM, OA s/p multiple joint replacements who presented to the ED for neck pain and dizziness 1 day after discharge. He had had 1 week of worsening, constant, severe L>R neck pain with associated numbness and weakness of the right upper extremity and found to have advanced cervical spondylosis with cord compression. Neurosurgery was consulted, recommended discharge home prior to surgery planned 9/16. Unfortunately he stayed in bed all day due to symptoms and returned on the advise of his brother. On reevaluation he had stable vital signs and labs, started on IV steroids.  07/15/18: Patient seen and examined at his bedside. No new complaints.  Assessment/Plan: Principal Problem:   Cervical radiculopathy Active Problems:   Essential hypertension   Essential tremor   Obstructive sleep apnea   GERD (gastroesophageal reflux disease)   Diabetes mellitus (HCC)   Chronic kidney disease, stage III (moderate) (HCC)   Coronary artery disease involving coronary bypass graft of native heart with angina pectoris ([place])  Cervical spondylosis with cord compression, radiculopathy:  - On the schedule for neurosurgery 9/16. - Continue pain control as ordered. Will stop steroids unless neurosurgery feels this is necessary as it is causing significant hyperglycemia.   IDDM: Severe hyperglycemia in response to steroids.  - Change insulin to levemir, 40 u BID (up from NPH 30u BID) and start novolog 30u TIDWC + resistant SSI and HS correction. Had to give 20u and another 20u this morning due to hyperglycemia, will monitor closely. - A1c 9.5  CAD, chronic HFpEF: No recent anginal symptoms or symptoms related to volume overload. Was evaluated by cardiology last admission with no further  work up requested. Euvolemic - Continue lasix, hydralazine, propranolol, norvasc  Constipation: Resolved.  - Continue bowel regimen due to need for opioids  Stage II CKD: Cr at baseline with CrCl >60ml/min.   HTN:  - Continue home medications  Hyperlipidemia:  - Continue statin  GERD:  - Continue PPI  OSA:  - CPAP qHS    Code Status: full   Family Communication: None  Disposition Plan: Home when hemodynamically    Consultants:  Neurosurgery   Procedures:  None   Antimicrobials:  None   DVT prophylaxis:  SCDs   Objective: Vitals:   07/14/18 2300 07/15/18 0300 07/15/18 0828 07/15/18 1213  BP: (!) 142/64 (!) 162/71 (!) 173/71 (!) 142/55  Pulse: 71 73 (!) 57 (!) 54  Resp:   18 19  Temp: 97.7 F (36.5 C) (!) 97.5 F (36.4 C) 97.8 F (36.6 C) 98.1 F (36.7 C)  TempSrc: Oral Oral Oral Oral  SpO2: 98% 98% 100% 99%  Weight:      Height:        Intake/Output Summary (Last 24 hours) at 07/15/2018 1222 Last data filed at 07/15/2018 0930 Gross per 24 hour  Intake 600 ml  Output 100 ml  Net 500 ml   Filed Weights   07/13/18 1130  Weight: 127 kg    Exam:  . General: 77 y.o. year-old male well developed well nourished in no acute distress.  Alert and oriented x3. . Cardiovascular: Regular rate and rhythm with no rubs or gallops.  No thyromegaly or JVD noted.   Marland Kitchen Respiratory: Clear to auscultation with no wheezes or rales. Good inspiratory effort. Marland Kitchen  Abdomen: Soft nontender nondistended with normal bowel sounds x4 quadrants. . Musculoskeletal: 1+ pitting edema LE. 2/4 pulses in all 4 extremities. Marland Kitchen Psychiatry: Mood is appropriate for condition and setting   Data Reviewed: CBC: Recent Labs  Lab 07/09/18 1027 07/13/18 1238  WBC 6.5 6.1  NEUTROABS  --  4.8  HGB 12.2* 12.6*  HCT 38.3* 39.9  MCV 89.9 89.1  PLT 229 284   Basic Metabolic Panel: Recent Labs  Lab 07/09/18 1027 07/10/18 0430 07/11/18 1046 07/13/18 1238  NA 140 136 136  135  K 3.9 4.2 4.8 4.5  CL 105 102 102 100  CO2 29 27 26 26   GLUCOSE 182* 335* 246* 245*  BUN 14 10 14 18   CREATININE 1.11 1.06 1.19 1.25*  CALCIUM 8.6* 8.2* 8.6* 8.5*   GFR: Estimated Creatinine Clearance: 65.3 mL/min (A) (by C-G formula based on SCr of 1.25 mg/dL (H)). Liver Function Tests: Recent Labs  Lab 07/13/18 1238  AST 20  ALT 15  ALKPHOS 88  BILITOT 0.7  PROT 5.9*  ALBUMIN 3.1*   No results for input(s): LIPASE, AMYLASE in the last 168 hours. No results for input(s): AMMONIA in the last 168 hours. Coagulation Profile: Recent Labs  Lab 07/10/18 0430  INR 1.09   Cardiac Enzymes: No results for input(s): CKTOTAL, CKMB, CKMBINDEX, TROPONINI in the last 168 hours. BNP (last 3 results) No results for input(s): PROBNP in the last 8760 hours. HbA1C: No results for input(s): HGBA1C in the last 72 hours. CBG: Recent Labs  Lab 07/14/18 1133 07/14/18 1432 07/14/18 1727 07/14/18 2122 07/15/18 0823  GLUCAP 467* 385* 365* 359* 220*   Lipid Profile: No results for input(s): CHOL, HDL, LDLCALC, TRIG, CHOLHDL, LDLDIRECT in the last 72 hours. Thyroid Function Tests: No results for input(s): TSH, T4TOTAL, FREET4, T3FREE, THYROIDAB in the last 72 hours. Anemia Panel: No results for input(s): VITAMINB12, FOLATE, FERRITIN, TIBC, IRON, RETICCTPCT in the last 72 hours. Urine analysis:    Component Value Date/Time   COLORURINE YELLOW 07/13/2018 [place] 07/13/2018 1234   LABSPEC 1.014 07/13/2018 1234   PHURINE 5.0 07/13/2018 1234   GLUCOSEU NEGATIVE 07/13/2018 1234   HGBUR NEGATIVE 07/13/2018 [place] 07/13/2018 1234   BILIRUBINUR neg 01/02/2017 [place] 07/13/2018 1234   PROTEINUR 30 (A) 07/13/2018 1234   UROBILINOGEN negative 01/02/2017 0959   NITRITE NEGATIVE 07/13/2018 1234   LEUKOCYTESUR NEGATIVE 07/13/2018 1234   Sepsis Labs: @LABRCNTIP (procalcitonin:4,lacticidven:4)  ) Recent Results (from the past  240 hour(s))  MRSA PCR Screening     Status: None   Collection Time: 07/14/18  3:04 AM  Result Value Ref Range Status   MRSA by PCR NEGATIVE NEGATIVE Final    Comment:        The GeneXpert MRSA Assay (FDA approved for NASAL specimens only), is one component of a comprehensive MRSA colonization surveillance program. It is not intended to diagnose MRSA infection nor to guide or monitor treatment for MRSA infections. Performed at [facility], [place] [address], [place], [place] [address]       Studies: No results found.  Scheduled Meds: . amLODipine  10 mg Oral Daily  . dicyclomine  10 mg Oral BID  . furosemide  60 mg Oral Daily  . gabapentin  300 mg Oral BID  . hydrALAZINE  25 mg Oral Q8H  . insulin aspart  0-20 Units Subcutaneous TID WC  . insulin aspart  0-5 Units Subcutaneous QHS  .  insulin aspart  30 Units Subcutaneous TID AC  . insulin detemir  40 Units Subcutaneous BID  . pantoprazole  40 mg Oral Daily  . polyethylene glycol  17 g Oral Daily  . potassium chloride SA  30 mEq Oral Daily  . prednisoLONE acetate  2 drop Both Eyes TID AC & HS  . primidone  50 mg Oral BID  . propranolol  40 mg Oral TID  . [START ON 07/17/2018] rosuvastatin  20 mg Oral Once per day on Tue Thu  . senna-docusate  2 tablet Oral BID    Continuous Infusions:   LOS: 2 days     Kayleen Memos, MD [facility] Pager [phone]  If 7PM-7AM, please contact night-coverage [email or web address] Password [facility] 07/15/2018, 12:22 PM

## 2018-07-15 NOTE — Discharge Summary (Signed)
Physician Discharge Summary  Edward [name]:[id number] DOB: April 18, 1941 DOA: 07/09/2018  PCP: Ann Held, DO  Admit date: 07/09/2018 Discharge date: 07/12/2018  Admitted From: Home.  Disposition:  Home.   Recommendations for Outpatient Follow-up:  1. Follow up with PCP in 1-2 weeks 2. Please obtain BMP/CBC in one week Please follow up with neuro surgery as recommended.  Discharge Condition:guarded.   CODE STATUS: full code.  Diet recommendation: Heart Healthy /  Brief/Interim Summary: Edward Coyer Priceis a right-handed77 y.o.malewith a history of CAD s/p CABG, chronic HFpEF, IDDM, OA s/p multiple joint replacements who presented to the ED for 1 week of worsening, constant, severe L>R neck pain with associated numbness and weakness of the right upper extremity. CTA head and neck ruled out vascular occlusion and MRI brain, cervical spine demonstrated advanced cervical spondylosis with cord compression. Neurosurgery was consulted, recommending medical admission  Discharge Diagnoses:  Principal Problem:   Cervical radiculopathy Active Problems:   Essential hypertension   Obstructive sleep apnea   Chronic diastolic heart failure (HCC)   Coronary artery disease involving coronary bypass graft of native heart with angina pectoris (HCC)   Anxiety   Cervical spinal stenosis  Cervical spondylosis with radiculopathy:  Neuro surgery consulted and recommendations given. Plan for surgery on the 16 th.  Pain control with oxycodone. PT eval.  Home once pain is controlled.    CAD: NO chest pain or sob.    Chronic diastolic heart failure:  Cardiology consulted for pre op clearance, and recommendations given.  Recommendations to hold plavix for 5 days prior to neuro surgery.    Insulin dep DM Resume home meds on discharge.   Hypertension; well controlled.       Discharge Instructions  Discharge Instructions    Diet - low sodium heart healthy   Complete  by:  As directed    Diet - low sodium heart healthy   Complete by:  As directed    Discharge instructions   Complete by:  As directed    [place] till the surgery and restart them asper neuro surgery recommendations.  Please follow up with neuro surgery for the surgery on 9/16.   Discharge instructions   Complete by:  As directed    Follow up with neuro surgery as recommended.  You are scheduled to have surgery on 9/16, please hold aspirin and plavix for the next 4 days and restart it as per your neuro surgery.     Allergies as of 07/12/2018      Reactions   Pneumococcal Vaccines Anaphylaxis      Medication List    STOP taking these medications   HYDROcodone-acetaminophen 5-325 MG tablet Commonly known as:  NORCO/VICODIN   traMADol 50 MG tablet Commonly known as:  ULTRAM     TAKE these medications   albuterol 108 (90 Base) MCG/ACT inhaler Commonly known as:  PROVENTIL HFA;VENTOLIN HFA inhale 2 puffs by mouth every 6 hours if needed for wheezing or shortness of breath   amLODipine 10 MG tablet Commonly known as:  NORVASC Take 1 tablet (10 mg total) by mouth daily.   aspirin EC 81 MG tablet Take 1 tablet (81 mg total) by mouth daily. Hold aspirin for 5 days for the surgery, restart it as per neuro surgery. What changed:  additional instructions   bismuth subsalicylate 614 ER/15QM suspension Commonly known as:  PEPTO BISMOL Take 30 mLs by mouth as needed for indigestion.   clonazePAM 1 MG  tablet Commonly known as:  KLONOPIN Take 1 tablet (1 mg total) by mouth at bedtime. What changed:    when to take this  reasons to take this   clopidogrel 75 MG tablet Commonly known as:  PLAVIX Take 1 tablet (75 mg total) by mouth daily. Hold plavix for the surgery, restart it as per neuro surgery. What changed:  additional instructions   cyclobenzaprine 5 MG tablet Commonly known as:  FLEXERIL Take 1 tablet (5 mg total) by mouth at bedtime as needed  for muscle spasms (headache). What changed:  reasons to take this   diclofenac sodium 1 % Gel Commonly known as:  VOLTAREN Apply 2 g topically 4 (four) times daily as needed (for pain).   dicyclomine 10 MG capsule Commonly known as:  BENTYL Take 10 mg by mouth 2 (two) times daily.   erythromycin ophthalmic ointment Place 1 application into both eyes See admin instructions. Apply to both eyelids once a day as needed for itching   esomeprazole 40 MG capsule Commonly known as:  NEXIUM Take 40 mg by mouth daily as needed (for reflux).   furosemide 40 MG tablet Commonly known as:  LASIX Take 1.5 tablets (60 mg total) by mouth daily.   gabapentin 300 MG capsule Commonly known as:  NEURONTIN take 1 capsule by mouth at bedtime for 1 week then INCREASE TO 1 CAP TWICE DAILY What changed:    how much to take  how to take this  when to take this  additional instructions   hydrALAZINE 25 MG tablet Commonly known as:  APRESOLINE Take 1 tablet (25 mg total) by mouth 3 (three) times daily.   insulin aspart 100 UNIT/ML injection Commonly known as:  novoLOG Inject 40 Units into the skin 3 (three) times daily before meals.   Insulin Syringe-Needle U-100 31G X 5/16" 1 ML Misc Use 4 times per day to inject insulin.   meclizine 25 MG tablet Commonly known as:  ANTIVERT Take 1 tablet (25 mg total) by mouth 2 (two) times daily as needed for dizziness.   nitroGLYCERIN 0.4 MG SL tablet Commonly known as:  NITROSTAT Place 1 tablet (0.4 mg total) under the tongue every 5 (five) minutes as needed for chest pain. Max: 3 doses per episode. What changed:    how much to take  when to take this  additional instructions   NOVOLIN N RELION 100 UNIT/ML injection Generic drug:  insulin NPH Human Inject 30 Units into the skin 2 (two) times daily before a meal.   oxyCODONE-acetaminophen 5-325 MG tablet Commonly known as:  PERCOCET/ROXICET Take 1-2 tablets by mouth every 4 (four) hours as  needed for moderate pain or severe pain.   polyethylene glycol packet Commonly known as:  MIRALAX / GLYCOLAX Take 17 g by mouth daily.   potassium chloride SA 20 MEQ tablet Commonly known as:  K-DUR,KLOR-CON TAKE 1 & 1/2 (ONE & ONE-HALF) TABLETS BY MOUTH ONCE DAILY What changed:  See the new instructions.   prednisoLONE acetate 1 % ophthalmic suspension Commonly known as:  PRED FORTE Place 2 drops into both eyes 2 (two) times daily as needed for itching.   primidone 50 MG tablet Commonly known as:  MYSOLINE Take 1 tablet (50 mg total) by mouth 2 (two) times daily.   propranolol 40 MG tablet Commonly known as:  INDERAL Take 1 tablet (40 mg total) by mouth 3 (three) times daily.   REFRESH OP Place 1 drop into both eyes 3 (three) times daily  as needed (for dry eyes). Reported on 12/25/2015   rosuvastatin 20 MG tablet Commonly known as:  CRESTOR Take 1 tablet (20 mg total) by mouth daily. What changed:  when to take this   senna-docusate 8.6-50 MG tablet Commonly known as:  Senokot-S Take 2 tablets by mouth 2 (two) times daily.      Follow-up Information    Ann Held, DO. Schedule an appointment as soon as possible for a visit in 1 week(s).   Specialty:  Family Medicine Contact information: [address] [place] [address] [phone]        Belva Crome, MD .   Specialty:  Cardiology Contact information: [phone] [place] [address] [phone]        Judith Part, MD Follow up in 1 day(s).   Specialty:  Neurosurgery Why:  call and confirm the surgery on 9/16.  Contact information: [address]  [place] [address] [phone]          Allergies  Allergen Reactions  . Pneumococcal Vaccines Anaphylaxis    Consultations:  Neuro surgery.    Procedures/Studies: Ct Angio Head W Or Wo Contrast  Result Date: 07/09/2018 CLINICAL DATA:  Initial evaluation for neck pain with  extremity weakness. EXAM: CT ANGIOGRAPHY HEAD AND NECK TECHNIQUE: Multidetector CT imaging of the head and neck was performed using the standard protocol during bolus administration of intravenous contrast. Multiplanar CT image reconstructions and MIPs were obtained to evaluate the vascular anatomy. Carotid stenosis measurements (when applicable) are obtained utilizing NASCET criteria, using the distal internal carotid diameter as the denominator. CONTRAST:  <See Chart> ISOVUE-370 IOPAMIDOL (ISOVUE-370) INJECTION 76% COMPARISON:  Prior CT from 05/22/2017. FINDINGS: CT HEAD FINDINGS Brain: Age-related cerebral atrophy with mild chronic small vessel ischemic disease. No acute intracranial hemorrhage. No acute large vessel territory infarct. No mass lesion, midline shift or mass effect. No hydrocephalus. No extra-axial fluid collection. Vascular: No hyperdense vessel. Skull: Scalp soft tissues and calvarium within normal limits. Sinuses: Paranasal sinuses are clear. Trace opacity right mastoid air cells noted, of doubtful significance. Orbits: Globes and orbital soft tissues within normal limits. Review of the MIP images confirms the above findings CTA NECK FINDINGS Aortic arch: Visualized aortic arch of normal caliber with normal 3 vessel morphology. Sequelae of prior CABG partially visualized. Mild atheromatous plaque within the arch and about the origin of the great vessels without hemodynamically significant stenosis. Visualized subclavian arteries widely patent. Right carotid system: Right common carotid artery patent from its origin to the bifurcation without flow-limiting stenosis. Mild atheromatous irregularity about the right bifurcation with associated stenosis of up to 40% by NASCET criteria. Right ICA widely patent distally to the skull base without stenosis, dissection, or occlusion. Left carotid system: Soft atheromatous plaque within the right common carotid artery with secondary mild diffuse stenosis.  Minimal calcified plaque about the left bifurcation without significant narrowing. Left ICA widely patent from the bifurcation to the skull base without stenosis, dissection, or occlusion. Vertebral arteries: Both of the vertebral arteries arise from the subclavian arteries. Focal plaque at the origin of the vertebral arteries bilaterally with associated moderate approximate 50% stenosis on the left and fairly severe stenosis on the right. Left vertebral artery dominant. Vertebral arteries otherwise widely patent within the neck without stenosis, dissection, or occlusion. Skeleton: No acute osseous abnormality. No discrete lytic or blastic osseous lesions. Other neck: No acute soft tissue abnormality within the neck. Few scattered subcentimeter  hypodense thyroid nodules noted, of doubtful significance given size and patient age. Upper chest: Visualized upper chest demonstrates no acute finding. Partially visualized lungs are grossly clear. Review of the MIP images confirms the above findings CTA HEAD FINDINGS Anterior circulation: Petrous segments widely patent bilaterally. Mild atheromatous plaque within the cavernous/supraclinoid ICAs without hemodynamically significant stenosis. ICA termini widely patent. A1 segments, anterior communicating artery common anterior cerebral arteries widely patent bilaterally. M1 segments widely patent without stenosis. No proximal M2 occlusion. Distal MCA branches well perfused and symmetric. Distal small vessel atheromatous irregularity noted. Posterior circulation: Mild atheromatous irregularity within the dominant left vertebral artery without hemodynamically significant stenosis. Focal moderate distal right V4 segment just beyond the takeoff of the right PICA. Posterior inferior cerebral arteries patent bilaterally. Basilar artery diminutive but widely patent to its distal aspect. Superior cerebral arteries patent bilaterally. Hypoplastic P1 segments with predominant fetal type  origin of the PCAs. Moderate to advanced multifocal atheromatous irregularity throughout the PCAs bilaterally with associated moderate to severe multifocal stenoses. Venous sinuses: Patent. Anatomic variants: Fetal type origin of the PCAs.  No aneurysm. Delayed phase: No abnormal enhancement. Review of the MIP images confirms the above findings IMPRESSION: 1. Negative CTA for large vessel occlusion. No other acute vascular abnormality within the head and neck. 2. Moderate intracranial atherosclerotic change, most notable within the bilateral PCAs and distal MCA branches. No other proximal high-grade or correctable stenosis identified. 3. 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 Left vertebral artery dominant. Predominant fetal type origin of the PCAs with overall diminutive vertebrobasilar system. 4. Atheromatous plaque about the right carotid bifurcation with associated stenosis of up to approximately 40% by NASCET criteria. Electronically Signed   By: Jeannine Boga M.D.   On: 07/09/2018 21:44   Ct Angio Neck W And/or Wo Contrast  Result Date: 07/09/2018 CLINICAL DATA:  Initial evaluation for neck pain with extremity weakness. EXAM: CT ANGIOGRAPHY HEAD AND NECK TECHNIQUE: Multidetector CT imaging of the head and neck was performed using the standard protocol during bolus administration of intravenous contrast. Multiplanar CT image reconstructions and MIPs were obtained to evaluate the vascular anatomy. Carotid stenosis measurements (when applicable) are obtained utilizing NASCET criteria, using the distal internal carotid diameter as the denominator. CONTRAST:  <See Chart> ISOVUE-370 IOPAMIDOL (ISOVUE-370) INJECTION 76% COMPARISON:  Prior CT from 05/22/2017. FINDINGS: CT HEAD FINDINGS Brain: Age-related cerebral atrophy with mild chronic small vessel ischemic disease. No acute intracranial hemorrhage. No acute large vessel  territory infarct. No mass lesion, midline shift or mass effect. No hydrocephalus. No extra-axial fluid collection. Vascular: No hyperdense vessel. Skull: Scalp soft tissues and calvarium within normal limits. Sinuses: Paranasal sinuses are clear. Trace opacity right mastoid air cells noted, of doubtful significance. Orbits: Globes and orbital soft tissues within normal limits. Review of the MIP images confirms the above findings CTA NECK FINDINGS Aortic arch: Visualized aortic arch of normal caliber with normal 3 vessel morphology. Sequelae of prior CABG partially visualized. Mild atheromatous plaque within the arch and about the origin of the great vessels without hemodynamically significant stenosis. Visualized subclavian arteries widely patent. Right carotid system: Right common carotid artery patent from its origin to the bifurcation without flow-limiting stenosis. Mild atheromatous irregularity about the right bifurcation with associated stenosis of up to 40% by NASCET criteria. Right ICA widely patent distally to the skull base without stenosis, dissection, or occlusion. Left carotid system: Soft atheromatous plaque within the right common  carotid artery with secondary mild diffuse stenosis. Minimal calcified plaque about the left bifurcation without significant narrowing. Left ICA widely patent from the bifurcation to the skull base without stenosis, dissection, or occlusion. Vertebral arteries: Both of the vertebral arteries arise from the subclavian arteries. Focal plaque at the origin of the vertebral arteries bilaterally with associated moderate approximate 50% stenosis on the left and fairly severe stenosis on the right. Left vertebral artery dominant. Vertebral arteries otherwise widely patent within the neck without stenosis, dissection, or occlusion. Skeleton: No acute osseous abnormality. No discrete lytic or blastic osseous lesions. Other neck: No acute soft tissue abnormality within the neck. Few  scattered subcentimeter hypodense thyroid nodules noted, of doubtful significance given size and patient age. Upper chest: Visualized upper chest demonstrates no acute finding. Partially visualized lungs are grossly clear. Review of the MIP images confirms the above findings CTA HEAD FINDINGS Anterior circulation: Petrous segments widely patent bilaterally. Mild atheromatous plaque within the cavernous/supraclinoid ICAs without hemodynamically significant stenosis. ICA termini widely patent. A1 segments, anterior communicating artery common anterior cerebral arteries widely patent bilaterally. M1 segments widely patent without stenosis. No proximal M2 occlusion. Distal MCA branches well perfused and symmetric. Distal small vessel atheromatous irregularity noted. Posterior circulation: Mild atheromatous irregularity within the dominant left vertebral artery without hemodynamically significant stenosis. Focal moderate distal right V4 segment just beyond the takeoff of the right PICA. Posterior inferior cerebral arteries patent bilaterally. Basilar artery diminutive but widely patent to its distal aspect. Superior cerebral arteries patent bilaterally. Hypoplastic P1 segments with predominant fetal type origin of the PCAs. Moderate to advanced multifocal atheromatous irregularity throughout the PCAs bilaterally with associated moderate to severe multifocal stenoses. Venous sinuses: Patent. Anatomic variants: Fetal type origin of the PCAs.  No aneurysm. Delayed phase: No abnormal enhancement. Review of the MIP images confirms the above findings IMPRESSION: 1. Negative CTA for large vessel occlusion. No other acute vascular abnormality within the head and neck. 2. Moderate intracranial atherosclerotic change, most notable within the bilateral PCAs and distal MCA branches. No other proximal high-grade or correctable stenosis identified. 3. Atheromatous stenoses at the origin of the vertebral arteries bilaterally, moderate  on the left and severe on the right. Left vertebral artery dominant. Predominant fetal type origin of the PCAs with overall diminutive vertebrobasilar system. 4. Atheromatous plaque about the right carotid bifurcation with associated stenosis of up to approximately 40% by NASCET criteria. Electronically Signed   By: Jeannine Boga M.D.   On: 07/09/2018 21:44   Mr Brain Wo Contrast  Result Date: 07/13/2018 CLINICAL DATA:  Initial evaluation for acute dizziness. EXAM: MRI HEAD WITHOUT CONTRAST TECHNIQUE: Multiplanar, multiecho pulse sequences of the brain and surrounding structures were obtained without intravenous contrast. COMPARISON:  Prior MRI from 07/09/2018. FINDINGS: Brain: Mild atrophy with chronic small vessel ischemic disease. Small remote lacunar infarct present within the right thalamus. No acute intracranial infarct. No evidence for acute or chronic intracranial hemorrhage. No mass lesion, midline shift or mass effect. No hydrocephalus. No extra-axial fluid collection. Pituitary gland within normal limits. Vascular: Major intracranial vascular flow voids maintained. Skull and upper cervical spine: Craniocervical junction within normal limits. Upper cervical spine normal. No focal marrow replacing lesion. Scalp soft tissues demonstrate no acute finding. Sinuses/Orbits: Sequelae of prior ocular lens replacement bilaterally. Paranasal sinuses clear. Small bilateral mastoid effusions noted, of doubtful significance. Other: None. IMPRESSION: 1. No acute intracranial abnormality. 2. Mild atrophy with chronic small vessel ischemic disease, with small remote right thalamic lacunar infarct, stable. Electronically  Signed   By: Jeannine Boga M.D.   On: 07/13/2018 20:08   Mr Brain W And Wo Contrast  Result Date: 07/10/2018 CLINICAL DATA:  77 y/o M; chief complaint of neck pain with bilateral upper and lower extremity weakness. EXAM: MRI HEAD WITHOUT AND WITH CONTRAST MRI CERVICAL SPINE WITHOUT  AND WITH CONTRAST TECHNIQUE: Multiplanar, multiecho pulse sequences of the brain and surrounding structures, and cervical spine, to include the craniocervical junction and cervicothoracic junction, were obtained without and with intravenous contrast. CONTRAST:  10 cc Gadavist. COMPARISON:  07/09/2018 CTA head and neck. FINDINGS: MRI HEAD FINDINGS Brain: No acute infarction, hemorrhage, hydrocephalus, extra-axial collection or mass lesion. Few nonspecific T2 FLAIR hyperintensities in subcortical and periventricular white matter are compatible with mild chronic microvascular ischemic changes for age. Mild volume loss of the brain. No abnormal enhancement. Vascular: Normal flow voids. Skull and upper cervical spine: Normal marrow signal. Sinuses/Orbits: Mild maxillary sinus mucosal thickening and partial opacification of mastoid air cells. Bilateral intra-ocular lens replacement. Other: None. MRI CERVICAL SPINE FINDINGS Alignment: Physiologic. Vertebrae: Mild right-sided C5-6 facet edema and small facet effusions at the left-sided C3-4 and C4-5 levels, likely degenerative. No findings of fracture or discitis. Cord: No abnormal cord signal.  No abnormal enhancement. Posterior Fossa, vertebral arteries, paraspinal tissues: Negative. Disc levels: C2-3: Right greater than left uncovertebral and facet hypertrophy. Mild right foraminal stenosis. No canal stenosis. C3-4: Disc osteophyte complex with bilateral uncovertebral and facet hypertrophy. Moderate bilateral foraminal and moderate canal stenosis. C4-5: Disc osteophyte complex eccentric with bilateral uncovertebral and facet hypertrophy. Severe bilateral foraminal stenosis. Moderate canal stenosis and right anterior cord impingement cord flattening. C5-6: Disc osteophyte complex with bilateral uncovertebral and facet hypertrophy. Severe bilateral foraminal stenosis and moderate canal stenosis. C6-7: Disc osteophyte complex with left central disc protrusion. Moderate  canal stenosis with left central anterior cord impingement. No significant foraminal stenosis. C7-T1: Small disc bulge and facet hypertrophy. Mild canal stenosis. No significant foraminal stenosis. IMPRESSION: MRI head: 1. No acute intracranial process or abnormal enhancement. 2. Mild chronic microvascular ischemic changes and volume loss of the brain. MRI cervical spine: 1. Mild right C5-6 as well as left C3-4 and C4-5 facet effusions, likely degenerative. 2. Advanced cervical spondylosis with disc and facet degenerative changes greatest at the C3-4 through C6-7 levels. 3. Moderate C3-4 through C6-7 canal stenosis. Right anterior C4-5 and left anterior C6-7 cord impingement. No abnormal cord signal. 4. Severe C4-5 and C5-6 bilateral foraminal stenosis. Multilevel mild and moderate foraminal stenosis. Electronically Signed   By: Kristine Garbe M.D.   On: 07/10/2018 00:56   Mr Cervical Spine W Or Wo Contrast  Result Date: 07/10/2018 CLINICAL DATA:  77 y/o M; chief complaint of neck pain with bilateral upper and lower extremity weakness. EXAM: MRI HEAD WITHOUT AND WITH CONTRAST MRI CERVICAL SPINE WITHOUT AND WITH CONTRAST TECHNIQUE: Multiplanar, multiecho pulse sequences of the brain and surrounding structures, and cervical spine, to include the craniocervical junction and cervicothoracic junction, were obtained without and with intravenous contrast. CONTRAST:  10 cc Gadavist. COMPARISON:  07/09/2018 CTA head and neck. FINDINGS: MRI HEAD FINDINGS Brain: No acute infarction, hemorrhage, hydrocephalus, extra-axial collection or mass lesion. Few nonspecific T2 FLAIR hyperintensities in subcortical and periventricular white matter are compatible with mild chronic microvascular ischemic changes for age. Mild volume loss of the brain. No abnormal enhancement. Vascular: Normal flow voids. Skull and upper cervical spine: Normal marrow signal. Sinuses/Orbits: Mild maxillary sinus mucosal thickening and partial  opacification of mastoid air cells.  Bilateral intra-ocular lens replacement. Other: None. MRI CERVICAL SPINE FINDINGS Alignment: Physiologic. Vertebrae: Mild right-sided C5-6 facet edema and small facet effusions at the left-sided C3-4 and C4-5 levels, likely degenerative. No findings of fracture or discitis. Cord: No abnormal cord signal.  No abnormal enhancement. Posterior Fossa, vertebral arteries, paraspinal tissues: Negative. Disc levels: C2-3: Right greater than left uncovertebral and facet hypertrophy. Mild right foraminal stenosis. No canal stenosis. C3-4: Disc osteophyte complex with bilateral uncovertebral and facet hypertrophy. Moderate bilateral foraminal and moderate canal stenosis. C4-5: Disc osteophyte complex eccentric with bilateral uncovertebral and facet hypertrophy. Severe bilateral foraminal stenosis. Moderate canal stenosis and right anterior cord impingement cord flattening. C5-6: Disc osteophyte complex with bilateral uncovertebral and facet hypertrophy. Severe bilateral foraminal stenosis and moderate canal stenosis. C6-7: Disc osteophyte complex with left central disc protrusion. Moderate canal stenosis with left central anterior cord impingement. No significant foraminal stenosis. C7-T1: Small disc bulge and facet hypertrophy. Mild canal stenosis. No significant foraminal stenosis. IMPRESSION: MRI head: 1. No acute intracranial process or abnormal enhancement. 2. Mild chronic microvascular ischemic changes and volume loss of the brain. MRI cervical spine: 1. Mild right C5-6 as well as left C3-4 and C4-5 facet effusions, likely degenerative. 2. Advanced cervical spondylosis with disc and facet degenerative changes greatest at the C3-4 through C6-7 levels. 3. Moderate C3-4 through C6-7 canal stenosis. Right anterior C4-5 and left anterior C6-7 cord impingement. No abnormal cord signal. 4. Severe C4-5 and C5-6 bilateral foraminal stenosis. Multilevel mild and moderate foraminal stenosis.  Electronically Signed   By: Kristine Garbe M.D.   On: 07/10/2018 00:56       Subjective: Pain controlled.   Discharge Exam: Vitals:   07/11/18 2157 07/12/18 0729  BP: (!) 127/56 124/67  Pulse: (!) 59 (!) 59  Resp: 18 18  Temp: (!) 97.5 F (36.4 C) (!) 97.5 F (36.4 C)  SpO2: 100% 100%   Vitals:   07/11/18 0854 07/11/18 1717 07/11/18 2157 07/12/18 0729  BP:  124/62 (!) 127/56 124/67  Pulse:  (!) 58 (!) 59 (!) 59  Resp:  18 18 18   Temp:  (!) 97.4 F (36.3 C) (!) 97.5 F (36.4 C) (!) 97.5 F (36.4 C)  TempSrc:  Oral Oral Oral  SpO2:  100% 100% 100%  Weight: 127.6 kg  127.8 kg   Height: 5\' 8"  (1.727 m)       General: Pt is alert, awake, not in acute distress Cardiovascular: RRR, S1/S2 +, no rubs, no gallops Respiratory: CTA bilaterally, no wheezing, no rhonchi Abdominal: Soft, NT, ND, bowel sounds + Extremities: no edema, no cyanosis    The results of significant diagnostics from this hospitalization (including imaging, microbiology, ancillary and laboratory) are listed below for reference.     Microbiology: Recent Results (from the past 240 hour(s))  MRSA PCR Screening     Status: None   Collection Time: 07/14/18  3:04 AM  Result Value Ref Range Status   MRSA by PCR NEGATIVE NEGATIVE Final    Comment:        The GeneXpert MRSA Assay (FDA approved for NASAL specimens only), is one component of a comprehensive MRSA colonization surveillance program. It is not intended to diagnose MRSA infection nor to guide or monitor treatment for MRSA infections. Performed at [facility], [place] [address], [place], [place] [address]      Labs: BNP (last 3 results) Recent Labs    07/15/17 1109 10/13/17 1027 03/08/18 1228  BNP 26.8 27.3 100.9*  Basic Metabolic Panel: Recent Labs  Lab 07/09/18 1027 07/10/18 0430 07/11/18 1046 07/13/18 1238  NA 140 136 136 135  K 3.9 4.2 4.8 4.5  CL 105 102 102 100  CO2 29 27 26 26   GLUCOSE 182* 335* 246*  245*  BUN 14 10 14 18   CREATININE 1.11 1.06 1.19 1.25*  CALCIUM 8.6* 8.2* 8.6* 8.5*   Liver Function Tests: Recent Labs  Lab 07/13/18 1238  AST 20  ALT 15  ALKPHOS 88  BILITOT 0.7  PROT 5.9*  ALBUMIN 3.1*   No results for input(s): LIPASE, AMYLASE in the last 168 hours. No results for input(s): AMMONIA in the last 168 hours. CBC: Recent Labs  Lab 07/09/18 1027 07/13/18 1238  WBC 6.5 6.1  NEUTROABS  --  4.8  HGB 12.2* 12.6*  HCT 38.3* 39.9  MCV 89.9 89.1  PLT 229 252   Cardiac Enzymes: No results for input(s): CKTOTAL, CKMB, CKMBINDEX, TROPONINI in the last 168 hours. BNP: Invalid input(s): POCBNP CBG: Recent Labs  Lab 07/14/18 1133 07/14/18 1432 07/14/18 1727 07/14/18 2122 07/15/18 0823  GLUCAP 467* 385* 365* 359* 220*   D-Dimer No results for input(s): DDIMER in the last 72 hours. Hgb A1c No results for input(s): HGBA1C in the last 72 hours. Lipid Profile No results for input(s): CHOL, HDL, LDLCALC, TRIG, CHOLHDL, LDLDIRECT in the last 72 hours. Thyroid function studies No results for input(s): TSH, T4TOTAL, T3FREE, THYROIDAB in the last 72 hours.  Invalid input(s): FREET3 Anemia work up No results for input(s): VITAMINB12, FOLATE, FERRITIN, TIBC, IRON, RETICCTPCT in the last 72 hours. Urinalysis    Component Value Date/Time   COLORURINE YELLOW 07/13/2018 [place] 07/13/2018 1234   LABSPEC 1.014 07/13/2018 1234   PHURINE 5.0 07/13/2018 1234   GLUCOSEU NEGATIVE 07/13/2018 1234   HGBUR NEGATIVE 07/13/2018 [place] 07/13/2018 1234   BILIRUBINUR neg 01/02/2017 0959   KETONESUR NEGATIVE 07/13/2018 1234   PROTEINUR 30 (A) 07/13/2018 1234   UROBILINOGEN negative 01/02/2017 0959   NITRITE NEGATIVE 07/13/2018 1234   LEUKOCYTESUR NEGATIVE 07/13/2018 1234   Sepsis Labs Invalid input(s): PROCALCITONIN,  WBC,  LACTICIDVEN Microbiology Recent Results (from the past 240 hour(s))  MRSA PCR Screening     Status: None    Collection Time: 07/14/18  3:04 AM  Result Value Ref Range Status   MRSA by PCR NEGATIVE NEGATIVE Final    Comment:        The GeneXpert MRSA Assay (FDA approved for NASAL specimens only), is one component of a comprehensive MRSA colonization surveillance program. It is not intended to diagnose MRSA infection nor to guide or monitor treatment for MRSA infections. Performed at [facility], [place] [address], [place], [place] [address]      Time coordinating discharge: 35 minutes  SIGNED:   Hosie Poisson, MD  [facility] 07/15/2018, 9:34 AM Pager   If 7PM-7AM, please contact night-coverage [email or web address] Password [email or web address]

## 2018-07-16 ENCOUNTER — Inpatient Hospital Stay (HOSPITAL_COMMUNITY): Payer: Medicare Other

## 2018-07-16 ENCOUNTER — Inpatient Hospital Stay (HOSPITAL_COMMUNITY): Payer: Medicare Other | Admitting: Physician Assistant

## 2018-07-16 ENCOUNTER — Encounter (HOSPITAL_COMMUNITY)
Admission: EM | Disposition: A | Discharge status: Nursing Home (Includes ICF) | Payer: Self-pay | Source: Home / Self Care | Attending: Internal Medicine

## 2018-07-16 ENCOUNTER — Encounter (HOSPITAL_COMMUNITY): Payer: Self-pay

## 2018-07-16 ENCOUNTER — Inpatient Hospital Stay (HOSPITAL_COMMUNITY): Admission: RE | Admit: 2018-07-16 | Payer: Medicare Other | Source: Ambulatory Visit | Admitting: Neurological Surgery

## 2018-07-16 HISTORY — PX: ANTERIOR CERVICAL DECOMPRESSION/DISCECTOMY FUSION 4 LEVELS: SHX5556

## 2018-07-16 LAB — CBC
HEMATOCRIT: 40.7 % (ref 39.0–52.0)
HEMATOCRIT: 43.4 % (ref 39.0–52.0)
Hemoglobin: 12.9 g/dL — ABNORMAL LOW (ref 13.0–17.0)
Hemoglobin: 13.7 g/dL (ref 13.0–17.0)
MCH: 28.1 pg (ref 26.0–34.0)
MCH: 28.4 pg (ref 26.0–34.0)
MCHC: 31.7 g/dL (ref 30.0–36.0)
MCHC: 31.6 g/dL (ref 30.0–36.0)
MCV: 88.7 fL (ref 78.0–100.0)
MCV: 89.9 fL (ref 78.0–100.0)
Platelets: 274 10*3/uL (ref 150–400)
Platelets: 244 10*3/uL (ref 150–400)
RBC: 4.59 MIL/uL (ref 4.22–5.81)
RBC: 4.83 MIL/uL (ref 4.22–5.81)
RDW: 13.5 % (ref 11.5–15.5)
RDW: 13.4 % (ref 11.5–15.5)
WBC: 11.3 10*3/uL — ABNORMAL HIGH (ref 4.0–10.5)
WBC: 10.8 10*3/uL — ABNORMAL HIGH (ref 4.0–10.5)

## 2018-07-16 LAB — BASIC METABOLIC PANEL
Anion gap: 7 (ref 5–15)
BUN: 22 mg/dL (ref 8–23)
CALCIUM: 9 mg/dL (ref 8.9–10.3)
CO2: 30 mmol/L (ref 22–32)
CREATININE: 1.22 mg/dL (ref 0.61–1.24)
Chloride: 103 mmol/L (ref 98–111)
GFR calc Af Amer: >60 mL/min (ref >60)
GFR calc non Af Amer: 56 mL/min — ABNORMAL LOW (ref >60)
Glucose, Bld: 111 mg/dL — ABNORMAL HIGH (ref 70–99)
Potassium: 4.1 mmol/L (ref 3.5–5.1)
Sodium: 140 mmol/L (ref 135–145)

## 2018-07-16 LAB — GLUCOSE, CAPILLARY
Glucose-Capillary: 51 mg/dL — ABNORMAL LOW (ref 70–99)
Glucose-Capillary: 74 mg/dL (ref 70–99)
Glucose-Capillary: 91 mg/dL (ref 70–99)
Glucose-Capillary: 188 mg/dL — ABNORMAL HIGH (ref 70–99)

## 2018-07-16 SURGERY — ANTERIOR CERVICAL DECOMPRESSION/DISCECTOMY FUSION 4 LEVELS
Anesthesia: General

## 2018-07-16 MED ORDER — LACTATED RINGERS IV SOLN
INTRAVENOUS | Status: DC
  Administered 2018-07-16 – 2018-07-29 (×3): via INTRAVENOUS

## 2018-07-16 MED ORDER — LIDOCAINE-EPINEPHRINE 1 %-1:100000 IJ SOLN
INTRAMUSCULAR | Status: AC
  Filled 2018-07-16: qty 1

## 2018-07-16 MED ORDER — LIDOCAINE-EPINEPHRINE 1 %-1:100000 IJ SOLN
INTRAMUSCULAR | Status: DC | PRN
  Administered 2018-07-16: 7 mL

## 2018-07-16 MED ORDER — THROMBIN 5000 UNITS EX SOLR
CUTANEOUS | Status: AC
  Filled 2018-07-16: qty 5000

## 2018-07-16 MED ORDER — DEXTROSE 50 % IV SOLN
INTRAVENOUS | Status: AC
  Administered 2018-07-16: 25 mL
  Filled 2018-07-16: qty 50

## 2018-07-16 MED ORDER — LIDOCAINE 2% (20 MG/ML) 5 ML SYRINGE
INTRAMUSCULAR | Status: DC | PRN
  Administered 2018-07-16: 75 mg via INTRAVENOUS

## 2018-07-16 MED ORDER — PROPOFOL 10 MG/ML IV BOLUS
INTRAVENOUS | Status: AC
  Filled 2018-07-16: qty 20

## 2018-07-16 MED ORDER — HEMOSTATIC AGENTS (NO CHARGE) OPTIME
TOPICAL | Status: DC | PRN
  Administered 2018-07-16: 1 via TOPICAL

## 2018-07-16 MED ORDER — ALBUMIN HUMAN 5 % IV SOLN
INTRAVENOUS | Status: DC | PRN
  Administered 2018-07-16: 17:00:00 via INTRAVENOUS

## 2018-07-16 MED ORDER — FENTANYL CITRATE (PF) 100 MCG/2ML IJ SOLN
INTRAMUSCULAR | Status: AC
  Filled 2018-07-16: qty 2

## 2018-07-16 MED ORDER — SUGAMMADEX SODIUM 500 MG/5ML IV SOLN
INTRAVENOUS | Status: AC
  Filled 2018-07-16: qty 5

## 2018-07-16 MED ORDER — ROCURONIUM BROMIDE 50 MG/5ML IV SOSY
PREFILLED_SYRINGE | INTRAVENOUS | Status: AC
  Filled 2018-07-16: qty 5

## 2018-07-16 MED ORDER — FENTANYL CITRATE (PF) 100 MCG/2ML IJ SOLN
25.0000 ug | INTRAMUSCULAR | Status: DC | PRN
  Administered 2018-07-16: 50 ug via INTRAVENOUS

## 2018-07-16 MED ORDER — ROCURONIUM BROMIDE 50 MG/5ML IV SOSY
PREFILLED_SYRINGE | INTRAVENOUS | Status: AC
  Filled 2018-07-16: qty 10

## 2018-07-16 MED ORDER — SUGAMMADEX SODIUM 200 MG/2ML IV SOLN
INTRAVENOUS | Status: DC | PRN
  Administered 2018-07-16: 500 mg via INTRAVENOUS

## 2018-07-16 MED ORDER — PROPOFOL 10 MG/ML IV BOLUS
INTRAVENOUS | Status: DC | PRN
  Administered 2018-07-16: 170 mg via INTRAVENOUS
  Administered 2018-07-16: 30 mg via INTRAVENOUS

## 2018-07-16 MED ORDER — SODIUM CHLORIDE 0.9 % IV SOLN
INTRAVENOUS | Status: DC | PRN
  Administered 2018-07-16: 35 ug/min via INTRAVENOUS

## 2018-07-16 MED ORDER — FENTANYL CITRATE (PF) 250 MCG/5ML IJ SOLN
INTRAMUSCULAR | Status: AC
  Filled 2018-07-16: qty 5

## 2018-07-16 MED ORDER — LACTATED RINGERS IV SOLN
INTRAVENOUS | Status: DC | PRN
  Administered 2018-07-16 (×2): via INTRAVENOUS

## 2018-07-16 MED ORDER — 0.9 % SODIUM CHLORIDE (POUR BTL) OPTIME
TOPICAL | Status: DC | PRN
  Administered 2018-07-16: 1000 mL

## 2018-07-16 MED ORDER — FENTANYL CITRATE (PF) 250 MCG/5ML IJ SOLN
INTRAMUSCULAR | Status: DC | PRN
  Administered 2018-07-16 (×3): 50 ug via INTRAVENOUS
  Administered 2018-07-16: 100 ug via INTRAVENOUS

## 2018-07-16 MED ORDER — ONDANSETRON HCL 4 MG/2ML IJ SOLN
INTRAMUSCULAR | Status: AC
  Filled 2018-07-16: qty 4

## 2018-07-16 MED ORDER — HYDROMORPHONE HCL 1 MG/ML IJ SOLN
0.5000 mg | INTRAMUSCULAR | Status: DC | PRN
  Administered 2018-07-17 – 2018-07-23 (×6): 0.5 mg via INTRAVENOUS
  Filled 2018-07-16: qty 0.5
  Filled 2018-07-16: qty 1
  Filled 2018-07-16 (×3): qty 0.5
  Filled 2018-07-16: qty 1

## 2018-07-16 MED ORDER — DEXTROSE 5 % IV SOLN
3.0000 g | INTRAVENOUS | Status: DC
  Filled 2018-07-16: qty 3000

## 2018-07-16 MED ORDER — SUCCINYLCHOLINE 20MG/ML (10ML) SYRINGE FOR MEDFUSION PUMP - OPTIME
INTRAMUSCULAR | Status: DC | PRN
  Administered 2018-07-16: 120 mg via INTRAVENOUS

## 2018-07-16 MED ORDER — LIDOCAINE 2% (20 MG/ML) 5 ML SYRINGE
INTRAMUSCULAR | Status: AC
  Filled 2018-07-16: qty 5

## 2018-07-16 MED ORDER — ROCURONIUM BROMIDE 10 MG/ML (PF) SYRINGE
PREFILLED_SYRINGE | INTRAVENOUS | Status: DC | PRN
  Administered 2018-07-16: 50 mg via INTRAVENOUS
  Administered 2018-07-16: 20 mg via INTRAVENOUS
  Administered 2018-07-16: 50 mg via INTRAVENOUS
  Administered 2018-07-16: 40 mg via INTRAVENOUS
  Administered 2018-07-16 (×2): 20 mg via INTRAVENOUS
  Administered 2018-07-16: 10 mg via INTRAVENOUS

## 2018-07-16 MED ORDER — ONDANSETRON HCL 4 MG/2ML IJ SOLN
INTRAMUSCULAR | Status: DC | PRN
  Administered 2018-07-16: 4 mg via INTRAVENOUS

## 2018-07-16 MED ORDER — SUCCINYLCHOLINE CHLORIDE 200 MG/10ML IV SOSY
PREFILLED_SYRINGE | INTRAVENOUS | Status: AC
  Filled 2018-07-16: qty 10

## 2018-07-16 MED ORDER — SODIUM CHLORIDE 0.9 % IV SOLN
INTRAVENOUS | Status: DC | PRN
  Administered 2018-07-16: 12:00:00

## 2018-07-16 SURGICAL SUPPLY — 58 items
BAG DECANTER FOR FLEXI CONT (MISCELLANEOUS) ×3 IMPLANT
BENZOIN TINCTURE PRP APPL 2/3 (GAUZE/BANDAGES/DRESSINGS) ×2 IMPLANT
BLADE CLIPPER SURG (BLADE) IMPLANT
BLADE SURG 15 STRL LF DISP TIS (BLADE) ×2 IMPLANT
BLADE SURG 15 STRL SS (BLADE) ×4
BUR MATCHSTICK NEURO 3.0 LAGG (BURR) ×3 IMPLANT
CANISTER SUCT 3000ML PPV (MISCELLANEOUS) ×3 IMPLANT
DERMABOND ADVANCED (GAUZE/BANDAGES/DRESSINGS) ×2
DERMABOND ADVANCED .7 DNX12 (GAUZE/BANDAGES/DRESSINGS) ×1 IMPLANT
DRAIN JACKSON RD 7FR 3/32 (WOUND CARE) ×2 IMPLANT
DRAPE C-ARM 42X72 X-RAY (DRAPES) ×6 IMPLANT
DRAPE HALF SHEET 40X57 (DRAPES) IMPLANT
DRAPE LAPAROTOMY 100X72 PEDS (DRAPES) ×3 IMPLANT
DRAPE MICROSCOPE LEICA (MISCELLANEOUS) ×3 IMPLANT
DURAPREP 6ML APPLICATOR 50/CS (WOUND CARE) ×3 IMPLANT
ELECT COATED BLADE 2.86 ST (ELECTRODE) ×3 IMPLANT
ELECT REM PT RETURN 9FT ADLT (ELECTROSURGICAL) ×3
ELECTRODE REM PT RTRN 9FT ADLT (ELECTROSURGICAL) ×1 IMPLANT
EVACUATOR SILICONE 100CC (DRAIN) ×2 IMPLANT
FLOSEAL 5ML (HEMOSTASIS) ×1 IMPLANT
GAUZE 4X4 16PLY RFD (DISPOSABLE) IMPLANT
GLOVE BIO SURGEON STRL SZ7.5 (GLOVE) ×3 IMPLANT
GLOVE BIOGEL PI IND STRL 7.5 (GLOVE) ×2 IMPLANT
GLOVE BIOGEL PI INDICATOR 7.5 (GLOVE) ×4
GLOVE EXAM NITRILE LRG STRL (GLOVE) IMPLANT
GLOVE EXAM NITRILE XL STR (GLOVE) IMPLANT
GLOVE EXAM NITRILE XS STR PU (GLOVE) IMPLANT
GOWN STRL REUS W/ TWL LRG LVL3 (GOWN DISPOSABLE) ×2 IMPLANT
GOWN STRL REUS W/ TWL XL LVL3 (GOWN DISPOSABLE) IMPLANT
GOWN STRL REUS W/TWL 2XL LVL3 (GOWN DISPOSABLE) IMPLANT
GOWN STRL REUS W/TWL LRG LVL3 (GOWN DISPOSABLE) ×4
GOWN STRL REUS W/TWL XL LVL3 (GOWN DISPOSABLE)
KIT BASIN OR (CUSTOM PROCEDURE TRAY) ×3 IMPLANT
KIT TURNOVER KIT B (KITS) ×3 IMPLANT
NDL SPNL 22GX3.5 QUINCKE BK (NEEDLE) ×2 IMPLANT
NEEDLE HYPO 22GX1.5 SAFETY (NEEDLE) ×3 IMPLANT
NEEDLE SPNL 22GX3.5 QUINCKE BK (NEEDLE) ×6 IMPLANT
NS IRRIG 1000ML POUR BTL (IV SOLUTION) ×3 IMPLANT
PACK LAMINECTOMY NEURO (CUSTOM PROCEDURE TRAY) ×3 IMPLANT
PAD ARMBOARD 7.5X6 YLW CONV (MISCELLANEOUS) ×9 IMPLANT
PIN DISTRACTION 14MM (PIN) ×6 IMPLANT
PLATE 4 75XNS SPNE CVD ANT T (Plate) IMPLANT
PLATE 4 ATLANTIS TRANS (Plate) ×2 IMPLANT
RUBBERBAND STERILE (MISCELLANEOUS) ×6 IMPLANT
SCREW RESCUE 13MM (Screw) ×3 IMPLANT
SCREW RESCUE VAR ANGLE 13 (Screw) IMPLANT
SCREW SELF TAP VAR 4.0X13 (Screw) ×18 IMPLANT
SPACER BONE CORNERSTONE 5X14 (Orthopedic Implant) ×8 IMPLANT
SPONGE INTESTINAL PEANUT (DISPOSABLE) ×3 IMPLANT
STAPLER VISISTAT 35W (STAPLE) IMPLANT
SURGIFLO W/THROMBIN 8M KIT (HEMOSTASIS) ×2 IMPLANT
SUT MNCRL AB 3-0 PS2 18 (SUTURE) ×3 IMPLANT
SUT VIC AB 3-0 SH 8-18 (SUTURE) ×5 IMPLANT
TAPE CLOTH 3X10 TAN LF (GAUZE/BANDAGES/DRESSINGS) ×3 IMPLANT
TOWEL GREEN STERILE (TOWEL DISPOSABLE) ×3 IMPLANT
TOWEL GREEN STERILE FF (TOWEL DISPOSABLE) ×3 IMPLANT
TRAY FOLEY CATH 16FRSI W/METER (SET/KITS/TRAYS/PACK) ×2 IMPLANT
WATER STERILE IRR 1000ML POUR (IV SOLUTION) ×3 IMPLANT

## 2018-07-16 NOTE — Op Note (Signed)
PATIENT: Edward [name]  DATE:07/16/18   PRE-OPERATIVE DIAGNOSIS:  Cervical myelopathy, cervical radiculopathy   POST-OPERATIVE DIAGNOSIS:  Cervical myelopathy, cervical radiculopathy   PROCEDURE:  C3-C4, C4-C5, C5-C6, C6-C7 Anterior Cervical Discectomy and Instrumented Fusion   SURGEON:  Surgeon(s) and Role:    Judith Part, MD - Primary    Kary Kos, MD - Assisting   ANESTHESIA: ETGA   BRIEF HISTORY: This is a 77yo man who presented with severe right upper extremity radicular pain, right upper and right lower extremity weakness, worsening ambulation, and numbness of the right upper extremity. The patient was found to have severe cervical central canal and foraminal stenosis. This was discussed with the patient as well as risks, benefits, and alternatives and the patient wished to proceed with surgery.   OPERATIVE DETAIL: The patient was taken to the operating room and placed on the OR table in the supine position. A formal time out was performed with two patient identifiers and confirmed the operative site. Anesthesia was induced by the anesthesia team.  Fluoroscopy was used to localize the surgical level and an incision was marked in a skin crease. The area was then prepped and draped in a sterile fashion. A transverse linear incision was made on the right side of the neck. The platysma was divided medial to the sternocleidomastoid muscle. The carotid sheath was palpated, identified, and retracted laterally with the sternocleidomastoid muscle. The strap muscles were identified and retracted medially and the pretracheal fascia was entered. A bent spinal needle was used with fluoroscopy to localize the surgical level after dissection. The longus colli were elevated bilaterally and a C5-6 discectomy was performed with bilateral foraminotomies. After thorough decompression of the bilateral foramina was obtained without any palpable evidence of residual stenosis, a 85mm cortical allograft  ([employer]) was inserted into the disc space as an interbody graft. This discectomy and foraminotomy procedure was then repeated at the C4-5, C3-4, and then C6-7 levels, each with a 28mm graft. An anterior plate ([employer]tronic) was positioned and 49mm screws were used to secure the plate to the C7, C6, C5, and C4 vertebral bodies bilaterally. At C3, a right sided screw was placed without issue, however the left C3 screw had poor purchase. It was directed medially when it had poor purchase and I was concerned that redirecting the screw more laterally would place neurovascular structures at risk, so no screw was placed in the most superior left portion of the plate. All screws were final tightened and then secured with the plate's retention device. A final fluoroscopic image was taken to confirm plate and graft position. Hemostasis was obtained and a #7 round drain was placed. The incision was then closed in layers. All instrument and sponge counts were correct. The patient was then returned to anesthesia for emergence. No apparent complications at the completion of the procedure.   EBL:  260mL   DRAINS: #7 round drain with JP bulb   SPECIMENS: none   Judith Part, MD 07/16/18 5:50 PM

## 2018-07-16 NOTE — Transfer of Care (Signed)
Immediate Anesthesia Transfer of Care Note  Patient: Edward [name]  Procedure(s) Performed: CERVICAL THREE-FOUR CERVICAL FOUR-FIVE CERVICAL FIVE-SIX CERVICAL SIX-SEVEN ANTERIOR CERVICAL DECOMPRESSION/DISCECTOMY/FUSION (N/A )  Patient Location: PACU  Anesthesia Type:General  Level of Consciousness: awake, alert  and oriented  Airway & Oxygen Therapy: Patient Spontanous Breathing and Patient connected to face mask oxygen  Post-op Assessment: Report given to RN and Post -op Vital signs reviewed and stable  Post vital signs: Reviewed and stable  Last Vitals:  Vitals Value Taken Time  BP 144/84 07/16/2018  5:35 PM  Temp    Pulse 69 07/16/2018  5:36 PM  Resp 11 07/16/2018  5:36 PM  SpO2 98 % 07/16/2018  5:36 PM  Vitals shown include unvalidated device data.  Last Pain:  Vitals:   07/16/18 0300  TempSrc: Oral  PainSc:       Patients Stated Pain Goal: 0 (02/54/27 0623)  Complications: No apparent anesthesia complications

## 2018-07-16 NOTE — Progress Notes (Signed)
Inpatient Diabetes Program Recommendations  AACE/ADA: New Consensus Statement on Inpatient Glycemic Control (2015)  Target Ranges:  Prepandial:   less than 140 mg/dL      Peak postprandial:   less than 180 mg/dL (1-2 hours)      Critically ill patients:  140 - 180 mg/dL   Lab Results  Component Value Date   GLUCAP 91 07/16/2018   HGBA1C 9.5 (H) 06/19/2018    Review of Glycemic ControlResults for TEON, HUDNALL (MRN [id number]) as of 07/16/2018 14:17  Ref. Range 07/15/2018 17:43 07/15/2018 20:42 07/16/2018 07:32 07/16/2018 08:18 07/16/2018 08:44  Glucose-Capillary Latest Ref Range: 70 - 99 mg/dL 198 (H) 213 (H) 51 (L) 74 91    Diabetes history: Type 2 DM  Outpatient Diabetes medications:  NPH 30 units bid, Novolog 40 units tid with meals Current orders for Inpatient glycemic control: Levemir 40 units bid, Novolog resistant tid with meals and HS, Novolog 30 units tid with meals  Inpatient Diabetes Program Recommendations:    Patient currently in surgery.  Reviewed chart.  Last visit with endocrinologist was 06/21/18.  Per Dr. Arman Filter note, patient should be on NPH 30 units bid, and Novolog 30 units tid with meals.  Per notes, A1C has actually improved.  Patient has had difficulty in the past affording insulin and there has been confusion regarding the right insulins/and doses.  Dr. Cruzita Lederer re-educated patient on doses and insulins at August visit.     -While in the hospital please consider reducing Levemir to 32 units bid.  Also consider reducing Novolog meal coverage to 20 units tid with meals.    Will follow.   Thanks,  Adah Perl, RN, BC-ADM Inpatient Diabetes Coordinator Pager [phone] (8a-5p)

## 2018-07-16 NOTE — Progress Notes (Addendum)
PROGRESS NOTE  Edward [name] FGH:[id number] DOB: 06/03/1941 DOA: 07/13/2018 PCP: Ann Held, DO  HPI/Recap of past 24 hours:  Edward [name] Counter is a right-handed 77 year old male with a history of coronary artery disease S/P CABG, chronic HFpEF, insulin-dependent diabetes mellitus osteoarthritis status post multiple joint replacement, who was admitted when he presented to the emergency room for neck pain and dizziness 1 day after being discharged his neck pain and dizziness was persistent and worsening left more than the right and associated with numbness and weakness of the right upper extremity.  He was found to have advanced cervical spondylosis with cord compression. He was admitted and started on IV steroids neurology was consulted.  And he planned surgery today July 16, 2018  Subjective patient seen and examined at PACU this afternoon.   Assessment/Plan: Principal Problem:   Cervical radiculopathy Active Problems:   Essential hypertension   Essential tremor   Obstructive sleep apnea   GERD (gastroesophageal reflux disease)   Diabetes mellitus (HCC)   Chronic kidney disease, stage III (moderate) (HCC)   Coronary artery disease involving coronary bypass graft of native heart with angina pectoris (HCC) Cervical radiculopathy patient had surgical decompression afternoon Uncontrolled type 2 diabetes mellitus which was complicated by steroids he is off steroids now Continue his Levemir along with sliding scale corrections Coronary artery disease, chronic HFpEF.  The patient is on Lasix and hydralazine as well as propanolol and Lovenox Tonic kidney disease stage II creatinine is at baseline Hypertension continue his home medication and monitor closely and optimize his medication Obstructive sleep apnea he is on CPAP at bedtime  Code Status: Full code  Family Communication: None at bedside  Disposition Plan: Be  determined   Consultants:  Neurology  Procedures:  C-spine decompression 07/16/2018  Antimicrobials:  Cefazolin x1 intraoperatively  DVT prophylaxis: SCD   Objective: Vitals:   07/16/18 1810 07/16/18 1815  BP: (!) 191/68 (!) 174/66  Pulse: 86 82  Resp: 19 17  Temp:    SpO2: 95% 95%    Intake/Output Summary (Last 24 hours) at 07/16/2018 1905 Last data filed at 07/16/2018 1702 Gross per 24 hour  Intake 2250 ml  Output 800 ml  Net 1450 ml   Filed Weights   07/13/18 1130  Weight: 127 kg   Body mass index is 42.57 kg/m.  Exam:   General: Obese he is still recovering from the effects of anesthesia  Cardiovascular: Rate regular rate and rhythm  Respiratory: Respiration unlabored lungs are clear bilaterally to auscultation  Abdomen: Abdomen obese full soft nontender  Musculoskeletal: Skeletal he is moving all 4 extremity.  Trace edema both lower extremity  Skin: Skin no rash warm and dry  Psychiatry: Psychiatry patient is still under the effect of anesthesia   Data Reviewed: CBC: Recent Labs  Lab 07/13/18 1238 07/16/18 0242 07/16/18 1818  WBC 6.1 11.3* 10.8*  NEUTROABS 4.8  --   --   HGB 12.6* 12.9* 13.7  HCT 39.9 40.7 43.4  MCV 89.1 88.7 89.9  PLT 252 274 678   Basic Metabolic Panel: Recent Labs  Lab 07/10/18 0430 07/11/18 1046 07/13/18 1238 07/16/18 0242  NA 136 136 135 140  K 4.2 4.8 4.5 4.1  CL 102 102 100 103  CO2 27 26 26 30   GLUCOSE 335* 246* 245* 111*  BUN 10 14 18 22   CREATININE 1.06 1.19 1.25* 1.22  CALCIUM 8.2* 8.6* 8.5* 9.0   GFR: Estimated Creatinine Clearance: 66.9 mL/min (by C-G formula  based on SCr of 1.22 mg/dL). Liver Function Tests: Recent Labs  Lab 07/13/18 1238  AST 20  ALT 15  ALKPHOS 88  BILITOT 0.7  PROT 5.9*  ALBUMIN 3.1*   No results for input(s): LIPASE, AMYLASE in the last 168 hours. No results for input(s): AMMONIA in the last 168 hours. Coagulation Profile: Recent Labs  Lab 07/10/18 0430   INR 1.09   Cardiac Enzymes: No results for input(s): CKTOTAL, CKMB, CKMBINDEX, TROPONINI in the last 168 hours. BNP (last 3 results) No results for input(s): PROBNP in the last 8760 hours. HbA1C: No results for input(s): HGBA1C in the last 72 hours. CBG: Recent Labs  Lab 07/15/18 1743 07/15/18 2042 07/16/18 0732 07/16/18 0818 07/16/18 0844  GLUCAP 198* 213* 51* 74 91   Lipid Profile: No results for input(s): CHOL, HDL, LDLCALC, TRIG, CHOLHDL, LDLDIRECT in the last 72 hours. Thyroid Function Tests: No results for input(s): TSH, T4TOTAL, FREET4, T3FREE, THYROIDAB in the last 72 hours. Anemia Panel: No results for input(s): VITAMINB12, FOLATE, FERRITIN, TIBC, IRON, RETICCTPCT in the last 72 hours. Urine analysis:    Component Value Date/Time   COLORURINE YELLOW 07/13/2018 [place] 07/13/2018 1234   LABSPEC 1.014 07/13/2018 1234   PHURINE 5.0 07/13/2018 1234   GLUCOSEU NEGATIVE 07/13/2018 1234   HGBUR NEGATIVE 07/13/2018 [place] 07/13/2018 1234   BILIRUBINUR neg 01/02/2017 [place] 07/13/2018 1234   PROTEINUR 30 (A) 07/13/2018 1234   UROBILINOGEN negative 01/02/2017 0959   NITRITE NEGATIVE 07/13/2018 1234   LEUKOCYTESUR NEGATIVE 07/13/2018 1234   Sepsis Labs: @LABRCNTIP (procalcitonin:4,lacticidven:4)  ) Recent Results (from the past 240 hour(s))  MRSA PCR Screening     Status: None   Collection Time: 07/14/18  3:04 AM  Result Value Ref Range Status   MRSA by PCR NEGATIVE NEGATIVE Final    Comment:        The GeneXpert MRSA Assay (FDA approved for NASAL specimens only), is one component of a comprehensive MRSA colonization surveillance program. It is not intended to diagnose MRSA infection nor to guide or monitor treatment for MRSA infections. Performed at [facility], [place] [address] [place], [place] [address]       Studies: Dg Cervical Spine 1 View  Result Date: 07/16/2018 CLINICAL DATA:   C3-7 ACDF. EXAM: DG CERVICAL SPINE - 1 VIEW; DG C-ARM 61-120 MIN COMPARISON:  Cervical MRI 07/09/2018. FINDINGS: A single lateral spot fluoroscopic image of the cervical spine is submitted. Small Field-of-view does not allow accurate numbering of the cervical segments. There is an anterior plate and screws, the inferior components of which are not well visualized. No complications are identified. IMPRESSION: Lateral view following cervical fusion. The inferior aspect of the hardware is incompletely visualized, and small field-of-view does not allow accurate numbering of the cervical segments. Electronically Signed   By: Richardean Sale M.D.   On: 07/16/2018 17:30   Dg C-arm 1-60 Min  Result Date: 07/16/2018 CLINICAL DATA:  C3-7 ACDF. EXAM: DG CERVICAL SPINE - 1 VIEW; DG C-ARM 61-120 MIN COMPARISON:  Cervical MRI 07/09/2018. FINDINGS: A single lateral spot fluoroscopic image of the cervical spine is submitted. Small Field-of-view does not allow accurate numbering of the cervical segments. There is an anterior plate and screws, the inferior components of which are not well visualized. No complications are identified. IMPRESSION: Lateral view following cervical fusion. The inferior aspect of the hardware is incompletely visualized, and small field-of-view does not allow accurate numbering  of the cervical segments. Electronically Signed   By: Richardean Sale M.D.   On: 07/16/2018 17:30   Dg C-arm 1-60 Min  Result Date: 07/16/2018 CLINICAL DATA:  C3-7 ACDF. EXAM: DG CERVICAL SPINE - 1 VIEW; DG C-ARM 61-120 MIN COMPARISON:  Cervical MRI 07/09/2018. FINDINGS: A single lateral spot fluoroscopic image of the cervical spine is submitted. Small Field-of-view does not allow accurate numbering of the cervical segments. There is an anterior plate and screws, the inferior components of which are not well visualized. No complications are identified. IMPRESSION: Lateral view following cervical fusion. The inferior aspect of  the hardware is incompletely visualized, and small field-of-view does not allow accurate numbering of the cervical segments. Electronically Signed   By: Richardean Sale M.D.   On: 07/16/2018 17:30   Dg C-arm 1-60 Min  Result Date: 07/16/2018 CLINICAL DATA:  C3-7 ACDF. EXAM: DG CERVICAL SPINE - 1 VIEW; DG C-ARM 61-120 MIN COMPARISON:  Cervical MRI 07/09/2018. FINDINGS: A single lateral spot fluoroscopic image of the cervical spine is submitted. Small Field-of-view does not allow accurate numbering of the cervical segments. There is an anterior plate and screws, the inferior components of which are not well visualized. No complications are identified. IMPRESSION: Lateral view following cervical fusion. The inferior aspect of the hardware is incompletely visualized, and small field-of-view does not allow accurate numbering of the cervical segments. Electronically Signed   By: Richardean Sale M.D.   On: 07/16/2018 17:30    Scheduled Meds: . [MAR Hold] amLODipine  10 mg Oral Daily  . [MAR Hold] dicyclomine  10 mg Oral BID  . fentaNYL      . [MAR Hold] furosemide  60 mg Oral Daily  . [MAR Hold] gabapentin  300 mg Oral BID  . [MAR Hold] hydrALAZINE  25 mg Oral Q8H  . [MAR Hold] insulin aspart  0-20 Units Subcutaneous TID WC  . [MAR Hold] insulin aspart  0-5 Units Subcutaneous QHS  . [MAR Hold] insulin aspart  30 Units Subcutaneous TID AC  . [MAR Hold] insulin detemir  40 Units Subcutaneous BID  . [MAR Hold] pantoprazole  40 mg Oral Daily  . [MAR Hold] polyethylene glycol  17 g Oral Daily  . [MAR Hold] potassium chloride SA  30 mEq Oral Daily  . [MAR Hold] prednisoLONE acetate  2 drop Both Eyes TID AC & HS  . [MAR Hold] primidone  50 mg Oral BID  . [MAR Hold] propranolol  40 mg Oral TID  . [MAR Hold] rosuvastatin  20 mg Oral Once per day on Tue Thu  . [MAR Hold] senna-docusate  2 tablet Oral BID    Continuous Infusions: .  ceFAZolin (ANCEF) IV    . lactated ringers 10 mL/hr at 07/16/18 0854      LOS: 3 days     Cristal Deer, MD [facility]  To reach me or the doctor on call, go to: [email or web address] Password [email or web address]  07/16/2018, 7:05 PM

## 2018-07-16 NOTE — Anesthesia Procedure Notes (Signed)
Procedure Name: Intubation Date/Time: 07/16/2018 10:40 AM Performed by: Valda Favia, CRNA Pre-anesthesia Checklist: Patient identified, Emergency Drugs available, Suction available and Patient being monitored Patient Re-evaluated:Patient Re-evaluated prior to induction Oxygen Delivery Method: Circle system utilized Preoxygenation: Pre-oxygenation with 100% oxygen Induction Type: IV induction Ventilation: Mask ventilation without difficulty Laryngoscope Size: Glidescope and 4 Grade View: Grade I Tube type: Oral Tube size: 7.5 mm Number of attempts: 1 Airway Equipment and Method: Stylet and Video-laryngoscopy Placement Confirmation: ETT inserted through vocal cords under direct vision,  positive ETCO2,  CO2 detector and breath sounds checked- equal and bilateral Secured at: 25 cm Tube secured with: Tape Dental Injury: Teeth and Oropharynx as per pre-operative assessment  Difficulty Due To: Difficulty was anticipated Comments: Performed by Gillian Scarce

## 2018-07-16 NOTE — Brief Op Note (Signed)
07/13/2018 - 07/16/2018  5:24 PM  PATIENT:  [name]  77 y.o. male  PRE-OPERATIVE DIAGNOSIS:  CERVICAL STENOSIS  POST-OPERATIVE DIAGNOSIS:  CERVICAL STENOSIS  PROCEDURE:  Procedure(s) with comments: CERVICAL THREE-FOUR CERVICAL FOUR-FIVE CERVICAL FIVE-SIX CERVICAL SIX-SEVEN ANTERIOR CERVICAL DECOMPRESSION/DISCECTOMY/FUSION (N/A) - CERVICAL THREE-FOUR CERVICAL FOUR-FIVE CERVICAL FIVE-SIX CERVICAL SIX-SEVEN ANTERIOR CERVICAL DECOMPRESSION/DISCECTOMY/FUSION  SURGEON:  Surgeon(s) and Role:    * Rashia Mckesson, Joyice Faster, MD - Primary    * Kary Kos, MD - Assisting  PHYSICIAN ASSISTANT:   ANESTHESIA:   general  EBL:  200 mL   BLOOD ADMINISTERED:none  DRAINS: Anterior cervical drain to JP bulb   LOCAL MEDICATIONS USED:  LIDOCAINE   SPECIMEN:  No Specimen  DISPOSITION OF SPECIMEN:  N/A  COUNTS:  YES  TOURNIQUET:  * No tourniquets in log *  DICTATION: .Note written in EPIC  PLAN OF CARE: Admit to inpatient   PATIENT DISPOSITION:  PACU - hemodynamically stable.   Delay start of Pharmacological VTE agent (>24hrs) due to surgical blood loss or risk of bleeding: yes

## 2018-07-16 NOTE — Anesthesia Postprocedure Evaluation (Signed)
Anesthesia Post Note  Patient: Edward [name]  Procedure(s) Performed: CERVICAL THREE-FOUR CERVICAL FOUR-FIVE CERVICAL FIVE-SIX CERVICAL SIX-SEVEN ANTERIOR CERVICAL DECOMPRESSION/DISCECTOMY/FUSION (N/A )     Patient location during evaluation: PACU Anesthesia Type: General Level of consciousness: awake Pain management: pain level controlled Vital Signs Assessment: post-procedure vital signs reviewed and stable Respiratory status: spontaneous breathing Cardiovascular status: stable Postop Assessment: no apparent nausea or vomiting Anesthetic complications: no    Last Vitals:  Vitals:   07/16/18 0300 07/16/18 0700  BP: (!) 138/59   Pulse: (!) 50   Resp:    Temp: 36.7 C 36.4 C  SpO2: 100%     Last Pain:  Vitals:   07/16/18 0300  TempSrc: Oral  PainSc:                  Bonnita Newby

## 2018-07-16 NOTE — Anesthesia Preprocedure Evaluation (Addendum)
Anesthesia Evaluation  Patient identified by MRN, date of birth, ID band Patient awake    Reviewed: Allergy & Precautions, NPO status   Airway Mallampati: II  TM Distance: >3 FB     Dental   Pulmonary sleep apnea ,    breath sounds clear to auscultation       Cardiovascular hypertension, + angina + CAD and +CHF   Rhythm:Regular Rate:Normal     Neuro/Psych  Headaches,  Neuromuscular disease    GI/Hepatic Neg liver ROS, GERD  ,  Endo/Other  diabetes  Renal/GU Renal diseasenegative Renal ROS     Musculoskeletal   Abdominal   Peds  Hematology   Anesthesia Other Findings   Reproductive/Obstetrics                             Anesthesia Physical Anesthesia Plan  ASA: III  Anesthesia Plan: General   Post-op Pain Management:    Induction: Intravenous  PONV Risk Score and Plan: Ondansetron, Dexamethasone and Midazolam  Airway Management Planned: Oral ETT  Additional Equipment:   Intra-op Plan:   Post-operative Plan: Possible Post-op intubation/ventilation  Informed Consent: I have reviewed the patients History and Physical, chart, labs and discussed the procedure including the risks, benefits and alternatives for the proposed anesthesia with the patient or authorized representative who has indicated his/her understanding and acceptance.   Dental advisory given  Plan Discussed with: CRNA and Anesthesiologist  Anesthesia Plan Comments:        Anesthesia Quick Evaluation

## 2018-07-16 NOTE — H&P (Signed)
Surgical H&P Update  HPI: 77 y.o. man with neck pain, progressive right upper extremity weakness, numbness, radicular pain pain, and worsening ambulation, here for surgical treatment. Pain is sharp, radiating, severe, worse with axial rotation to the right. He was discharged home with a plan to return for surgery after he held his ASA and plavix for one week. However, at home he had failure to thrive, worsening pain, difficulty with ambulation, and difficulty controlling his blood sugars so he was admitted to the medicine service for pain control and pre-operative optimization.   PMHx:  Past Medical History:  Diagnosis Date  . Adenomatous colon polyp 04/2011  . CAD (coronary artery disease)    a. 01/2015 DES to LAD  b. 12/2015: [place] 85% oLCx lesion--> rx therapy.  . Chronic diastolic CHF (congestive heart failure) ([place])    a. 05/2017 Echo: EF 60-65%, Gr1 DD, no rwma, mildly dil LA, nl RV fxn, mild TR.  . CKD (chronic kidney disease), stage II    a. probable CKD II-III with baseline CR 1.1-1.3.  . DM type 2 (diabetes mellitus, type 2), insulin dependent 01/29/2014   fasting cbg 50-120 with new regimen  . Dyslipidemia, goal LDL below 70 01/29/2014  . Enlarged prostate   . Essential tremor    a. on proprnolol  . GERD (gastroesophageal reflux disease)   . Hx of CABG   . Hypertension   . Internal hemorrhoid   . Sleep apnea    does not use cpap (06/04/2015)  . TIA (transient ischemic attack) 2002    FamHx:  Family History  Problem Relation Age of Onset  . CAD Brother        2 brothers - CABG  . Alzheimer's disease Sister   . CAD Sister   . Prostate cancer Brother   . Asthma Brother        2 brothers   . Diabetes Other        entire family  . Colon cancer Neg Hx     SocHx:  Social History   Socioeconomic History  . Marital status: Married    Spouse name: Not on file  . Number of children: 0  . Years of education: Not on file  . Highest education level: Not on file  Occupational  History  . Occupation: retired  Scientific laboratory technician  . Financial resource strain: Not on file  . Food insecurity:    Worry: Not on file    Inability: Not on file  . Transportation needs:    Medical: Not on file    Non-medical: Not on file  Tobacco Use  . Smoking status: Never Smoker  . Smokeless tobacco: Never Used  Substance and Sexual Activity  . Alcohol use: No    Alcohol/week: 0.0 standard drinks  . Drug use: No  . Sexual activity: Not Currently  Lifestyle  . Physical activity:    Days per week: Not on file    Minutes per session: Not on file  . Stress: Not on file  Relationships  . Social connections:    Talks on phone: Not on file    Gets together: Not on file    Attends religious service: Not on file    Active member of club or organization: Not on file    Attends meetings of clubs or organizations: Not on file    Relationship status: Not on file  . Intimate partner violence:    Fear of current or ex partner: Not on file  Emotionally abused: Not on file    Physically abused: Not on file    Forced sexual activity: Not on file  Other Topics Concern  . Not on file  Social History Narrative   He is a Geophysicist/field seismologist by trade.   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   He lives alone.  His wife is living in [place] at this time.  They do not have children.   Highest level of education:  Forensic psychologist   Physical Exam: AOx3, PERRL, EOMI, FS Strength 5/5 in LUE / LLE RUE Strength: D 5/5, B 4/5, WE 4+/5, T 4/5, G 4-/5 RLE Strength: HF 5/5, KE 5/5, DF 5/5, EHL 5/5, PF 5/5 SILT except for right hand in all digits, numbness worst in all digits, today it is worst in the 3rd and 4th digits No hoffman's, no clonus  Assesment/Plan: 77 y.o. 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 There is also severe  right foraminal stenosis at C4-5, moderate on the left at C4-5, and severe bilateral foraminal stenosis at C5-6. Plan to treat this with a C3-4/4-5/5-6/6-7 ACDF with a goal of preventing worsening neurologic function, decreasing his refractory pain, and hopefully restoring neurologic function.   I discussed the above with the patient at length. With his multiple co-morbidities and suboptimal glucose control, ordinarily my preference would be to defer surgery for long term glucose optimization to minimize wound healing complications. However, he has been having progressive weakness in his arm and is now unable to ambulate. This loss of neurologic function will likely continue to progress, so we will have to proceed with surgery now. Risks, benefits, and alternatives discussed and the patient would like to continue with surgery.  -OR today -return to [facility] post-op  Judith Part, MD 07/16/18 7:07 AM

## 2018-07-17 ENCOUNTER — Encounter (HOSPITAL_COMMUNITY): Payer: Self-pay | Admitting: Neurological Surgery

## 2018-07-17 LAB — GLUCOSE, CAPILLARY
GLUCOSE-CAPILLARY: 54 mg/dL — AB (ref 70–99)
GLUCOSE-CAPILLARY: 127 mg/dL — AB (ref 70–99)
Glucose-Capillary: 248 mg/dL — ABNORMAL HIGH (ref 70–99)
Glucose-Capillary: 210 mg/dL — ABNORMAL HIGH (ref 70–99)
Glucose-Capillary: 66 mg/dL — ABNORMAL LOW (ref 70–99)
Glucose-Capillary: 140 mg/dL — ABNORMAL HIGH (ref 70–99)

## 2018-07-17 MED ORDER — HYDROCOD POLST-CPM POLST ER 10-8 MG/5ML PO SUER
5.0000 mL | Freq: Once | ORAL | Status: AC
  Administered 2018-07-17: 5 mL via ORAL
  Filled 2018-07-17: qty 5

## 2018-07-17 MED ORDER — INSULIN DETEMIR 100 UNIT/ML ~~LOC~~ SOLN
20.0000 [IU] | Freq: Once | SUBCUTANEOUS | Status: AC
  Filled 2018-07-17: qty 0.2

## 2018-07-17 MED ORDER — DEXTROSE 50 % IV SOLN
INTRAVENOUS | Status: AC
  Administered 2018-07-17: 25 mL
  Filled 2018-07-17: qty 50

## 2018-07-17 NOTE — Progress Notes (Signed)
PROGRESS NOTE        PATIENT DETAILS Name: Edward [name] Age: 77 y.o. Sex: male Date of Birth: April 20, 1941 Admit Date: 07/13/2018 Admitting Physician Elwyn Reach, MD JFH:[id number] Koren Shiver, DO  Brief Narrative: Patient is a 77 y.o. male with prior history of CAD status post CABG, chronic systolic heart failure, insulin-dependent diabetes who was just discharged from the hospital on 9/12 after he was evaluated for right upper extremity weakness/numbness and right radicular pain-he was found to have severe cervical stenosis resulting in cord compression-since he was on antiplatelet agents-which were held-plans were for outpatient cervical spine decompression, however patient presented to the hospital on 9/13 with constant neck pain, and associated right upper extremity numbness, patient was reevaluated by neurosurgery and underwent cervical spine decompression/discectomy on 9/16.  See below for further details.  Subjective: Doing well-denies any chest pain or shortness of breath.  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  Insulin-dependent diabetes: CBGs fluctuating-no longer on steroids-for now would continue with Levemir 40 units twice daily, 30 units of NovoLog with meals, and SSI-and reevaluate tomorrow.  Chronic diastolic heart failure: Volume status stable.  Continue Lasix-follow electrolytes, weights and intake output.  CAD: No anginal symptoms: Resume antiplatelets when okay with neurosurgery.  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  OSA: CPAP nightly  DVT Prophylaxis: SCD's  Code Status: Full code  Family  Communication: None at bedside  Disposition Plan: Remain inpatient-await PT evaluation to determine appropriate disposition  Antimicrobial agents: Anti-infectives (From admission, onward)   Start     Dose/Rate Route Frequency Ordered Stop   07/16/18 1445  ceFAZolin (ANCEF) 3 g in dextrose 5 % 50 mL IVPB  Status:  Discontinued     3 g 100 mL/hr over 30 Minutes Intravenous To Surgery 07/16/18 1436 07/16/18 1959   07/16/18 1200  bacitracin 50,000 Units in sodium chloride 0.9 % 500 mL irrigation  Status:  Discontinued       As needed 07/16/18 1201 07/16/18 1727      Procedures: 9/16>> CERVICAL THREE-FOUR CERVICAL FOUR-FIVE CERVICAL FIVE-SIX CERVICAL SIX-SEVEN ANTERIOR CERVICAL DECOMPRESSION/DISCECTOMY/FUSION (N/A) - CERVICAL THREE-FOUR CERVICAL FOUR-FIVE CERVICAL FIVE-SIX CERVICAL SIX-SEVEN ANTERIOR CERVICAL DECOMPRESSION/DISCECTOMY/FUSION  CONSULTS:  Neurosurgery  Time spent: 25- minutes-Greater than 50% of this time was spent in counseling, explanation of diagnosis, planning of further management, and coordination of care.  MEDICATIONS: Scheduled Meds: . amLODipine  10 mg Oral Daily  . dicyclomine  10 mg Oral BID  . furosemide  60 mg Oral Daily  . gabapentin  300 mg Oral BID  . hydrALAZINE  25 mg Oral Q8H  . insulin aspart  0-20 Units Subcutaneous TID WC  . insulin aspart  0-5 Units Subcutaneous QHS  . insulin aspart  30 Units Subcutaneous TID AC  . insulin detemir  40 Units Subcutaneous BID  . pantoprazole  40 mg Oral Daily  . polyethylene glycol  17 g Oral Daily  . potassium chloride SA  30 mEq Oral Daily  . prednisoLONE acetate  2 drop Both Eyes TID AC & HS  . primidone  50 mg Oral BID  . propranolol  40  mg Oral TID  . rosuvastatin  20 mg Oral Once per day on Tue Thu  . senna-docusate  2 tablet Oral BID   Continuous Infusions: . lactated ringers 10 mL/hr at 07/16/18 0854   PRN Meds:.bismuth subsalicylate, clonazePAM, cyclobenzaprine, diclofenac sodium, erythromycin,  HYDROmorphone (DILAUDID) injection, meclizine, nitroGLYCERIN, oxyCODONE-acetaminophen   PHYSICAL EXAM: Vital signs: Vitals:   07/16/18 2105 07/16/18 2213 07/17/18 0340 07/17/18 0921  BP: (!) 170/61 (!) 127/53 (!) 130/52 (!) 157/60  Pulse:  72 70   Resp:  15 12   Temp:  98.6 F (37 C) 97.9 F (36.6 C)   TempSrc:  Oral Oral   SpO2:  95% 97%   Weight:      Height:       Filed Weights   07/13/18 1130  Weight: 127 kg   Body mass index is 42.57 kg/m.   General appearance :Awake, alert, not in any distress. Speech Clear. Not toxic Looking Eyes:, pupils equally reactive to light and accomodation,no scleral icterus.Pink conjunctiva HEENT: Atraumatic and Normocephalic Neck: supple, no JVD. No cervical lymphadenopathy. No thyromegaly Resp:Good air entry bilaterally, no added sounds  CVS: S1 S2 regular, no murmurs.  GI: Bowel sounds present, Non tender and not distended with no gaurding, rigidity or rebound.No organomegaly Extremities: B/L Lower Ext shows no edema, both legs are warm to touch Neurology:  speech clear,Non focal Psychiatric: Normal judgment and insight. Alert and oriented x 3.  Musculoskeletal:No digital cyanosis Skin:No Rash, warm and dry Wounds:N/A  I have personally reviewed following labs and imaging studies  LABORATORY DATA: CBC: Recent Labs  Lab 07/13/18 1238 07/16/18 0242 07/16/18 1818  WBC 6.1 11.3* 10.8*  NEUTROABS 4.8  --   --   HGB 12.6* 12.9* 13.7  HCT 39.9 40.7 43.4  MCV 89.1 88.7 89.9  PLT 252 274 166    Basic Metabolic Panel: Recent Labs  Lab 07/11/18 1046 07/13/18 1238 07/16/18 0242  NA 136 135 140  K 4.8 4.5 4.1  CL 102 100 103  CO2 26 26 30   GLUCOSE 246* 245* 111*  BUN 14 18 22   CREATININE 1.19 1.25* 1.22  CALCIUM 8.6* 8.5* 9.0    GFR: Estimated Creatinine Clearance: 66.9 mL/min (by C-G formula based on SCr of 1.22 mg/dL).  Liver Function Tests: Recent Labs  Lab 07/13/18 1238  AST 20  ALT 15  ALKPHOS 88  BILITOT  0.7  PROT 5.9*  ALBUMIN 3.1*   No results for input(s): LIPASE, AMYLASE in the last 168 hours. No results for input(s): AMMONIA in the last 168 hours.  Coagulation Profile: No results for input(s): INR, PROTIME in the last 168 hours.  Cardiac Enzymes: No results for input(s): CKTOTAL, CKMB, CKMBINDEX, TROPONINI in the last 168 hours.  BNP (last 3 results) No results for input(s): PROBNP in the last 8760 hours.  HbA1C: No results for input(s): HGBA1C in the last 72 hours.  CBG: Recent Labs  Lab 07/16/18 0732 07/16/18 0818 07/16/18 0844 07/16/18 2014 07/17/18 0753  GLUCAP 51* 74 91 188* 248*    Lipid Profile: No results for input(s): CHOL, HDL, LDLCALC, TRIG, CHOLHDL, LDLDIRECT in the last 72 hours.  Thyroid Function Tests: No results for input(s): TSH, T4TOTAL, FREET4, T3FREE, THYROIDAB in the last 72 hours.  Anemia Panel: No results for input(s): VITAMINB12, FOLATE, FERRITIN, TIBC, IRON, RETICCTPCT in the last 72 hours.  Urine analysis:    Component Value Date/Time   COLORURINE YELLOW 07/13/2018 [place] 07/13/2018 1234   LABSPEC  1.014 07/13/2018 1234   PHURINE 5.0 07/13/2018 1234   GLUCOSEU NEGATIVE 07/13/2018 1234   HGBUR NEGATIVE 07/13/2018 [place] 07/13/2018 1234   BILIRUBINUR neg 01/02/2017 [place] 07/13/2018 1234   PROTEINUR 30 (A) 07/13/2018 1234   UROBILINOGEN negative 01/02/2017 0959   NITRITE NEGATIVE 07/13/2018 1234   LEUKOCYTESUR NEGATIVE 07/13/2018 1234    Sepsis Labs: Lactic Acid, Venous    Component Value Date/Time   LATICACIDVEN 1.87 04/25/2018 1131    MICROBIOLOGY: Recent Results (from the past 240 hour(s))  MRSA PCR Screening     Status: None   Collection Time: 07/14/18  3:04 AM  Result Value Ref Range Status   MRSA by PCR NEGATIVE NEGATIVE Final    Comment:        The GeneXpert MRSA Assay (FDA approved for NASAL specimens only), is one component of a comprehensive MRSA  colonization surveillance program. It is not intended to diagnose MRSA infection nor to guide or monitor treatment for MRSA infections. Performed at [facility], [place] [address], [place], [place] [address]     RADIOLOGY STUDIES/RESULTS: Ct Angio Head W Or Wo Contrast  Result Date: 07/09/2018 CLINICAL DATA:  Initial evaluation for neck pain with extremity weakness. EXAM: CT ANGIOGRAPHY HEAD AND NECK TECHNIQUE: Multidetector CT imaging of the head and neck was performed using the standard protocol during bolus administration of intravenous contrast. Multiplanar CT image reconstructions and MIPs were obtained to evaluate the vascular anatomy. Carotid stenosis measurements (when applicable) are obtained utilizing NASCET criteria, using the distal internal carotid diameter as the denominator. CONTRAST:  <See Chart> ISOVUE-370 IOPAMIDOL (ISOVUE-370) INJECTION 76% COMPARISON:  Prior CT from 05/22/2017. FINDINGS: CT HEAD FINDINGS Brain: Age-related cerebral atrophy with mild chronic small vessel ischemic disease. No acute intracranial hemorrhage. No acute large vessel territory infarct. No mass lesion, midline shift or mass effect. No hydrocephalus. No extra-axial fluid collection. Vascular: No hyperdense vessel. Skull: Scalp soft tissues and calvarium within normal limits. Sinuses: Paranasal sinuses are clear. Trace opacity right mastoid air cells noted, of doubtful significance. Orbits: Globes and orbital soft tissues within normal limits. Review of the MIP images confirms the above findings CTA NECK FINDINGS Aortic arch: Visualized aortic arch of normal caliber with normal 3 vessel morphology. Sequelae of prior CABG partially visualized. Mild atheromatous plaque within the arch and about the origin of the great vessels without hemodynamically significant stenosis. Visualized subclavian arteries widely patent. Right carotid system: Right common carotid artery patent from its origin to the bifurcation  without flow-limiting stenosis. Mild atheromatous irregularity about the right bifurcation with associated stenosis of up to 40% by NASCET criteria. Right ICA widely patent distally to the skull base without stenosis, dissection, or occlusion. Left carotid system: Soft atheromatous plaque within the right common carotid artery with secondary mild diffuse stenosis. Minimal calcified plaque about the left bifurcation without significant narrowing. Left ICA widely patent from the bifurcation to the skull base without stenosis, dissection, or occlusion. Vertebral arteries: Both of the vertebral arteries arise from the subclavian arteries. Focal plaque at the origin of the vertebral arteries bilaterally with associated moderate approximate 50% stenosis on the left and fairly severe stenosis on the right. Left vertebral artery dominant. Vertebral arteries otherwise widely patent within the neck without stenosis, dissection, or occlusion. Skeleton: No acute osseous abnormality. No discrete lytic or blastic osseous lesions. Other neck: No acute soft tissue abnormality within the neck. Few scattered subcentimeter hypodense thyroid nodules noted, of doubtful significance  given size and patient age. Upper chest: Visualized upper chest demonstrates no acute finding. Partially visualized lungs are grossly clear. Review of the MIP images confirms the above findings CTA HEAD FINDINGS Anterior circulation: Petrous segments widely patent bilaterally. Mild atheromatous plaque within the cavernous/supraclinoid ICAs without hemodynamically significant stenosis. ICA termini widely patent. A1 segments, anterior communicating artery common anterior cerebral arteries widely patent bilaterally. M1 segments widely patent without stenosis. No proximal M2 occlusion. Distal MCA branches well perfused and symmetric. Distal small vessel atheromatous irregularity noted. Posterior circulation: Mild atheromatous irregularity within the dominant left  vertebral artery without hemodynamically significant stenosis. Focal moderate distal right V4 segment just beyond the takeoff of the right PICA. Posterior inferior cerebral arteries patent bilaterally. Basilar artery diminutive but widely patent to its distal aspect. Superior cerebral arteries patent bilaterally. Hypoplastic P1 segments with predominant fetal type origin of the PCAs. Moderate to advanced multifocal atheromatous irregularity throughout the PCAs bilaterally with associated moderate to severe multifocal stenoses. Venous sinuses: Patent. Anatomic variants: Fetal type origin of the PCAs.  No aneurysm. Delayed phase: No abnormal enhancement. Review of the MIP images confirms the above findings IMPRESSION: 1. Negative CTA for large vessel occlusion. No other acute vascular abnormality within the head and neck. 2. Moderate intracranial atherosclerotic change, most notable within the bilateral PCAs and distal MCA branches. No other proximal high-grade or correctable stenosis identified. 3. 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 Left vertebral artery dominant. Predominant fetal type origin of the PCAs with overall diminutive vertebrobasilar system. 4. Atheromatous plaque about the right carotid bifurcation with associated stenosis of up to approximately 40% by NASCET criteria. Electronically Signed   By: Jeannine Boga M.D.   On: 07/09/2018 21:44   Dg Cervical Spine 1 View  Result Date: 07/16/2018 CLINICAL DATA:  C3-7 ACDF. EXAM: DG CERVICAL SPINE - 1 VIEW; DG C-ARM 61-120 MIN COMPARISON:  Cervical MRI 07/09/2018. FINDINGS: A single lateral spot fluoroscopic image of the cervical spine is submitted. Small Field-of-view does not allow accurate numbering of the cervical segments. There is an anterior plate and screws, the inferior components of which are not well visualized. No complications are identified. IMPRESSION:  Lateral view following cervical fusion. The inferior aspect of the hardware is incompletely visualized, and small field-of-view does not allow accurate numbering of the cervical segments. Electronically Signed   By: Richardean Sale M.D.   On: 07/16/2018 17:30   Ct Angio Neck W And/or Wo Contrast  Result Date: 07/09/2018 CLINICAL DATA:  Initial evaluation for neck pain with extremity weakness. EXAM: CT ANGIOGRAPHY HEAD AND NECK TECHNIQUE: Multidetector CT imaging of the head and neck was performed using the standard protocol during bolus administration of intravenous contrast. Multiplanar CT image reconstructions and MIPs were obtained to evaluate the vascular anatomy. Carotid stenosis measurements (when applicable) are obtained utilizing NASCET criteria, using the distal internal carotid diameter as the denominator. CONTRAST:  <See Chart> ISOVUE-370 IOPAMIDOL (ISOVUE-370) INJECTION 76% COMPARISON:  Prior CT from 05/22/2017. FINDINGS: CT HEAD FINDINGS Brain: Age-related cerebral atrophy with mild chronic small vessel ischemic disease. No acute intracranial hemorrhage. No acute large vessel territory infarct. No mass lesion, midline shift or mass effect. No hydrocephalus. No extra-axial fluid collection. Vascular: No hyperdense vessel. Skull: Scalp soft tissues and calvarium within normal limits. Sinuses: Paranasal sinuses are clear. Trace opacity right mastoid air cells noted, of doubtful significance. Orbits: Globes and orbital soft tissues within normal limits. Review of  the MIP images confirms the above findings CTA NECK FINDINGS Aortic arch: Visualized aortic arch of normal caliber with normal 3 vessel morphology. Sequelae of prior CABG partially visualized. Mild atheromatous plaque within the arch and about the origin of the great vessels without hemodynamically significant stenosis. Visualized subclavian arteries widely patent. Right carotid system: Right common carotid artery patent from its origin to the  bifurcation without flow-limiting stenosis. Mild atheromatous irregularity about the right bifurcation with associated stenosis of up to 40% by NASCET criteria. Right ICA widely patent distally to the skull base without stenosis, dissection, or occlusion. Left carotid system: Soft atheromatous plaque within the right common carotid artery with secondary mild diffuse stenosis. Minimal calcified plaque about the left bifurcation without significant narrowing. Left ICA widely patent from the bifurcation to the skull base without stenosis, dissection, or occlusion. Vertebral arteries: Both of the vertebral arteries arise from the subclavian arteries. Focal plaque at the origin of the vertebral arteries bilaterally with associated moderate approximate 50% stenosis on the left and fairly severe stenosis on the right. Left vertebral artery dominant. Vertebral arteries otherwise widely patent within the neck without stenosis, dissection, or occlusion. Skeleton: No acute osseous abnormality. No discrete lytic or blastic osseous lesions. Other neck: No acute soft tissue abnormality within the neck. Few scattered subcentimeter hypodense thyroid nodules noted, of doubtful significance given size and patient age. Upper chest: Visualized upper chest demonstrates no acute finding. Partially visualized lungs are grossly clear. Review of the MIP images confirms the above findings CTA HEAD FINDINGS Anterior circulation: Petrous segments widely patent bilaterally. Mild atheromatous plaque within the cavernous/supraclinoid ICAs without hemodynamically significant stenosis. ICA termini widely patent. A1 segments, anterior communicating artery common anterior cerebral arteries widely patent bilaterally. M1 segments widely patent without stenosis. No proximal M2 occlusion. Distal MCA branches well perfused and symmetric. Distal small vessel atheromatous irregularity noted. Posterior circulation: Mild atheromatous irregularity within the  dominant left vertebral artery without hemodynamically significant stenosis. Focal moderate distal right V4 segment just beyond the takeoff of the right PICA. Posterior inferior cerebral arteries patent bilaterally. Basilar artery diminutive but widely patent to its distal aspect. Superior cerebral arteries patent bilaterally. Hypoplastic P1 segments with predominant fetal type origin of the PCAs. Moderate to advanced multifocal atheromatous irregularity throughout the PCAs bilaterally with associated moderate to severe multifocal stenoses. Venous sinuses: Patent. Anatomic variants: Fetal type origin of the PCAs.  No aneurysm. Delayed phase: No abnormal enhancement. Review of the MIP images confirms the above findings IMPRESSION: 1. Negative CTA for large vessel occlusion. No other acute vascular abnormality within the head and neck. 2. Moderate intracranial atherosclerotic change, most notable within the bilateral PCAs and distal MCA branches. No other proximal high-grade or correctable stenosis identified. 3. 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 Left vertebral artery dominant. Predominant fetal type origin of the PCAs with overall diminutive vertebrobasilar system. 4. Atheromatous plaque about the right carotid bifurcation with associated stenosis of up to approximately 40% by NASCET criteria. Electronically Signed   By: Jeannine Boga M.D.   On: 07/09/2018 21:44   Mr Brain Wo Contrast  Result Date: 07/13/2018 CLINICAL DATA:  Initial evaluation for acute dizziness. EXAM: MRI HEAD WITHOUT CONTRAST TECHNIQUE: Multiplanar, multiecho pulse sequences of the brain and surrounding structures were obtained without intravenous contrast. COMPARISON:  Prior MRI from 07/09/2018. FINDINGS: Brain: Mild atrophy with chronic small vessel ischemic disease. Small remote lacunar infarct present within the right  thalamus. No acute  intracranial infarct. No evidence for acute or chronic intracranial hemorrhage. No mass lesion, midline shift or mass effect. No hydrocephalus. No extra-axial fluid collection. Pituitary gland within normal limits. Vascular: Major intracranial vascular flow voids maintained. Skull and upper cervical spine: Craniocervical junction within normal limits. Upper cervical spine normal. No focal marrow replacing lesion. Scalp soft tissues demonstrate no acute finding. Sinuses/Orbits: Sequelae of prior ocular lens replacement bilaterally. Paranasal sinuses clear. Small bilateral mastoid effusions noted, of doubtful significance. Other: None. IMPRESSION: 1. No acute intracranial abnormality. 2. Mild atrophy with chronic small vessel ischemic disease, with small remote right thalamic lacunar infarct, stable. Electronically Signed   By: Jeannine Boga M.D.   On: 07/13/2018 20:08   Mr Brain W And Wo Contrast  Result Date: 07/10/2018 CLINICAL DATA:  77 y/o M; chief complaint of neck pain with bilateral upper and lower extremity weakness. EXAM: MRI HEAD WITHOUT AND WITH CONTRAST MRI CERVICAL SPINE WITHOUT AND WITH CONTRAST TECHNIQUE: Multiplanar, multiecho pulse sequences of the brain and surrounding structures, and cervical spine, to include the craniocervical junction and cervicothoracic junction, were obtained without and with intravenous contrast. CONTRAST:  10 cc Gadavist. COMPARISON:  07/09/2018 CTA head and neck. FINDINGS: MRI HEAD FINDINGS Brain: No acute infarction, hemorrhage, hydrocephalus, extra-axial collection or mass lesion. Few nonspecific T2 FLAIR hyperintensities in subcortical and periventricular white matter are compatible with mild chronic microvascular ischemic changes for age. Mild volume loss of the brain. No abnormal enhancement. Vascular: Normal flow voids. Skull and upper cervical spine: Normal marrow signal. Sinuses/Orbits: Mild maxillary sinus mucosal thickening and partial opacification of  mastoid air cells. Bilateral intra-ocular lens replacement. Other: None. MRI CERVICAL SPINE FINDINGS Alignment: Physiologic. Vertebrae: Mild right-sided C5-6 facet edema and small facet effusions at the left-sided C3-4 and C4-5 levels, likely degenerative. No findings of fracture or discitis. Cord: No abnormal cord signal.  No abnormal enhancement. Posterior Fossa, vertebral arteries, paraspinal tissues: Negative. Disc levels: C2-3: Right greater than left uncovertebral and facet hypertrophy. Mild right foraminal stenosis. No canal stenosis. C3-4: Disc osteophyte complex with bilateral uncovertebral and facet hypertrophy. Moderate bilateral foraminal and moderate canal stenosis. C4-5: Disc osteophyte complex eccentric with bilateral uncovertebral and facet hypertrophy. Severe bilateral foraminal stenosis. Moderate canal stenosis and right anterior cord impingement cord flattening. C5-6: Disc osteophyte complex with bilateral uncovertebral and facet hypertrophy. Severe bilateral foraminal stenosis and moderate canal stenosis. C6-7: Disc osteophyte complex with left central disc protrusion. Moderate canal stenosis with left central anterior cord impingement. No significant foraminal stenosis. C7-T1: Small disc bulge and facet hypertrophy. Mild canal stenosis. No significant foraminal stenosis. IMPRESSION: MRI head: 1. No acute intracranial process or abnormal enhancement. 2. Mild chronic microvascular ischemic changes and volume loss of the brain. MRI cervical spine: 1. Mild right C5-6 as well as left C3-4 and C4-5 facet effusions, likely degenerative. 2. Advanced cervical spondylosis with disc and facet degenerative changes greatest at the C3-4 through C6-7 levels. 3. Moderate C3-4 through C6-7 canal stenosis. Right anterior C4-5 and left anterior C6-7 cord impingement. No abnormal cord signal. 4. Severe C4-5 and C5-6 bilateral foraminal stenosis. Multilevel mild and moderate foraminal stenosis. Electronically Signed    By: Kristine Garbe M.D.   On: 07/10/2018 00:56   Mr Cervical Spine W Or Wo Contrast  Result Date: 07/10/2018 CLINICAL DATA:  77 y/o M; chief complaint of neck pain with bilateral upper and lower extremity weakness. EXAM: MRI HEAD WITHOUT AND WITH CONTRAST MRI CERVICAL SPINE WITHOUT AND WITH CONTRAST TECHNIQUE:  Multiplanar, multiecho pulse sequences of the brain and surrounding structures, and cervical spine, to include the craniocervical junction and cervicothoracic junction, were obtained without and with intravenous contrast. CONTRAST:  10 cc Gadavist. COMPARISON:  07/09/2018 CTA head and neck. FINDINGS: MRI HEAD FINDINGS Brain: No acute infarction, hemorrhage, hydrocephalus, extra-axial collection or mass lesion. Few nonspecific T2 FLAIR hyperintensities in subcortical and periventricular white matter are compatible with mild chronic microvascular ischemic changes for age. Mild volume loss of the brain. No abnormal enhancement. Vascular: Normal flow voids. Skull and upper cervical spine: Normal marrow signal. Sinuses/Orbits: Mild maxillary sinus mucosal thickening and partial opacification of mastoid air cells. Bilateral intra-ocular lens replacement. Other: None. MRI CERVICAL SPINE FINDINGS Alignment: Physiologic. Vertebrae: Mild right-sided C5-6 facet edema and small facet effusions at the left-sided C3-4 and C4-5 levels, likely degenerative. No findings of fracture or discitis. Cord: No abnormal cord signal.  No abnormal enhancement. Posterior Fossa, vertebral arteries, paraspinal tissues: Negative. Disc levels: C2-3: Right greater than left uncovertebral and facet hypertrophy. Mild right foraminal stenosis. No canal stenosis. C3-4: Disc osteophyte complex with bilateral uncovertebral and facet hypertrophy. Moderate bilateral foraminal and moderate canal stenosis. C4-5: Disc osteophyte complex eccentric with bilateral uncovertebral and facet hypertrophy. Severe bilateral foraminal stenosis.  Moderate canal stenosis and right anterior cord impingement cord flattening. C5-6: Disc osteophyte complex with bilateral uncovertebral and facet hypertrophy. Severe bilateral foraminal stenosis and moderate canal stenosis. C6-7: Disc osteophyte complex with left central disc protrusion. Moderate canal stenosis with left central anterior cord impingement. No significant foraminal stenosis. C7-T1: Small disc bulge and facet hypertrophy. Mild canal stenosis. No significant foraminal stenosis. IMPRESSION: MRI head: 1. No acute intracranial process or abnormal enhancement. 2. Mild chronic microvascular ischemic changes and volume loss of the brain. MRI cervical spine: 1. Mild right C5-6 as well as left C3-4 and C4-5 facet effusions, likely degenerative. 2. Advanced cervical spondylosis with disc and facet degenerative changes greatest at the C3-4 through C6-7 levels. 3. Moderate C3-4 through C6-7 canal stenosis. Right anterior C4-5 and left anterior C6-7 cord impingement. No abnormal cord signal. 4. Severe C4-5 and C5-6 bilateral foraminal stenosis. Multilevel mild and moderate foraminal stenosis. Electronically Signed   By: Kristine Garbe M.D.   On: 07/10/2018 00:56   Dg C-arm 1-60 Min  Result Date: 07/16/2018 CLINICAL DATA:  C3-7 ACDF. EXAM: DG CERVICAL SPINE - 1 VIEW; DG C-ARM 61-120 MIN COMPARISON:  Cervical MRI 07/09/2018. FINDINGS: A single lateral spot fluoroscopic image of the cervical spine is submitted. Small Field-of-view does not allow accurate numbering of the cervical segments. There is an anterior plate and screws, the inferior components of which are not well visualized. No complications are identified. IMPRESSION: Lateral view following cervical fusion. The inferior aspect of the hardware is incompletely visualized, and small field-of-view does not allow accurate numbering of the cervical segments. Electronically Signed   By: Richardean Sale M.D.   On: 07/16/2018 17:30   Dg C-arm 1-60  Min  Result Date: 07/16/2018 CLINICAL DATA:  C3-7 ACDF. EXAM: DG CERVICAL SPINE - 1 VIEW; DG C-ARM 61-120 MIN COMPARISON:  Cervical MRI 07/09/2018. FINDINGS: A single lateral spot fluoroscopic image of the cervical spine is submitted. Small Field-of-view does not allow accurate numbering of the cervical segments. There is an anterior plate and screws, the inferior components of which are not well visualized. No complications are identified. IMPRESSION: Lateral view following cervical fusion. The inferior aspect of the hardware is incompletely visualized, and small field-of-view does not allow accurate numbering of  the cervical segments. Electronically Signed   By: Richardean Sale M.D.   On: 07/16/2018 17:30   Dg C-arm 1-60 Min  Result Date: 07/16/2018 CLINICAL DATA:  C3-7 ACDF. EXAM: DG CERVICAL SPINE - 1 VIEW; DG C-ARM 61-120 MIN COMPARISON:  Cervical MRI 07/09/2018. FINDINGS: A single lateral spot fluoroscopic image of the cervical spine is submitted. Small Field-of-view does not allow accurate numbering of the cervical segments. There is an anterior plate and screws, the inferior components of which are not well visualized. No complications are identified. IMPRESSION: Lateral view following cervical fusion. The inferior aspect of the hardware is incompletely visualized, and small field-of-view does not allow accurate numbering of the cervical segments. Electronically Signed   By: Richardean Sale M.D.   On: 07/16/2018 17:30     LOS: 4 days   Oren Binet, MD  [facility]  If 7PM-7AM, please contact night-coverage  Please page via [email or web address] [email or web address]-click on MD name and type text message  07/17/2018, 10:56 AM

## 2018-07-17 NOTE — Progress Notes (Signed)
Neurosurgery Service Progress Note  Subjective: No acute events overnight. Tolerating oral fluids this morning without difficulty, RUE radicular pain resolved, no new weakness/numbness/parasthesias   Objective: Vitals:   07/16/18 2000 07/16/18 2105 07/16/18 2213 07/17/18 0340  BP: (!) 158/56 (!) 170/61 (!) 127/53 (!) 130/52  Pulse: 76  72 70  Resp: 17  15 12   Temp:   98.6 F (37 C) 97.9 F (36.6 C)  TempSrc:   Oral Oral  SpO2: 96%  95% 97%  Weight:      Height:       Temp (24hrs), Avg:98.1 F (36.7 C), Min:97.9 F (36.6 C), Max:98.6 F (37 C)  CBC Latest Ref Rng & Units 07/16/2018 07/16/2018 07/13/2018  WBC 4.0 - 10.5 K/uL 10.8(H) 11.3(H) 6.1  Hemoglobin 13.0 - 17.0 g/dL 13.7 12.9(L) 12.6(L)  Hematocrit 39.0 - 52.0 % 43.4 40.7 39.9  Platelets 150 - 400 K/uL 244 274 252   BMP Latest Ref Rng & Units 07/16/2018 07/13/2018 07/11/2018  Glucose 70 - 99 mg/dL 111(H) 245(H) 246(H)  BUN 8 - 23 mg/dL 22 18 14   Creatinine 0.61 - 1.24 mg/dL 1.22 1.25(H) 1.19  BUN/Creat Ratio 10 - 24 - - -  Sodium 135 - 145 mmol/L 140 135 136  Potassium 3.5 - 5.1 mmol/L 4.1 4.5 4.8  Chloride 98 - 111 mmol/L 103 100 102  CO2 22 - 32 mmol/L 30 26 26   Calcium 8.9 - 10.3 mg/dL 9.0 8.5(L) 8.6(L)    Intake/Output Summary (Last 24 hours) at 07/17/2018 0730 Last data filed at 07/16/2018 2300 Gross per 24 hour  Intake 2250 ml  Output 1145 ml  Net 1105 ml    Current Facility-Administered Medications:  .  amLODipine (NORVASC) tablet 10 mg, 10 mg, Oral, Daily, Judith Part, MD, 10 mg at 07/15/18 0916 .  bismuth subsalicylate (PEPTO BISMOL) 262 MG/15ML suspension 30 mL, 30 mL, Oral, BID PRN, Judith Part, MD .  clonazePAM (KLONOPIN) tablet 1 mg, 1 mg, Oral, QHS PRN, Judith Part, MD, 1 mg at 07/14/18 2235 .  cyclobenzaprine (FLEXERIL) tablet 5 mg, 5 mg, Oral, QHS PRN, Judith Part, MD, 5 mg at 07/16/18 2105 .  diclofenac sodium (VOLTAREN) 1 % transdermal gel 2 g, 2 g, Topical, QID  PRN, Judith Part, MD .  dicyclomine (BENTYL) capsule 10 mg, 10 mg, Oral, BID, Judith Part, MD, 10 mg at 07/16/18 2106 .  erythromycin ophthalmic ointment 1 application, 1 application, Both Eyes, Daily PRN, Tyyonna Soucy A, MD .  furosemide (LASIX) tablet 60 mg, 60 mg, Oral, Daily, Judith Part, MD, 60 mg at 07/15/18 0917 .  gabapentin (NEURONTIN) capsule 300 mg, 300 mg, Oral, BID, Judith Part, MD, 300 mg at 07/16/18 2105 .  hydrALAZINE (APRESOLINE) tablet 25 mg, 25 mg, Oral, Q8H, Leray Garverick, Joyice Faster, MD, 25 mg at 07/17/18 0102 .  HYDROmorphone (DILAUDID) injection 0.5 mg, 0.5 mg, Intravenous, Q3H PRN, Naida Escalante A, MD .  insulin aspart (novoLOG) injection 0-20 Units, 0-20 Units, Subcutaneous, TID WC, Judith Part, MD, 4 Units at 07/15/18 1801 .  insulin aspart (novoLOG) injection 0-5 Units, 0-5 Units, Subcutaneous, QHS, Judith Part, MD, 2 Units at 07/15/18 2133 .  insulin aspart (novoLOG) injection 30 Units, 30 Units, Subcutaneous, TID AC, Judith Part, MD, 30 Units at 07/15/18 1801 .  insulin detemir (LEVEMIR) injection 40 Units, 40 Units, Subcutaneous, BID, Judith Part, MD, 40 Units at 07/16/18 2140 .  lactated ringers infusion, , Intravenous, Continuous,  Belinda Block, MD, Last Rate: 10 mL/hr at 07/16/18 0854 .  meclizine (ANTIVERT) tablet 25 mg, 25 mg, Oral, BID PRN, Judith Part, MD .  nitroGLYCERIN (NITROSTAT) SL tablet 0.4-0.8 mg, 0.4-0.8 mg, Sublingual, Q5 Min x 3 PRN, Ngoc Daughtridge A, MD .  oxyCODONE-acetaminophen (PERCOCET/ROXICET) 5-325 MG per tablet 1-2 tablet, 1-2 tablet, Oral, Q4H PRN, Judith Part, MD, 2 tablet at 07/17/18 [phone] .  pantoprazole (PROTONIX) EC tablet 40 mg, 40 mg, Oral, Daily, Judith Part, MD, 40 mg at 07/15/18 0917 .  polyethylene glycol (MIRALAX / GLYCOLAX) packet 17 g, 17 g, Oral, Daily, Judith Part, MD, 17 g at 07/15/18 6295 .  potassium chloride  (K-DUR,KLOR-CON) CR tablet 30 mEq, 30 mEq, Oral, Daily, Judith Part, MD, 30 mEq at 07/15/18 0917 .  prednisoLONE acetate (PRED FORTE) 1 % ophthalmic suspension 2 drop, 2 drop, Both Eyes, TID AC & HS, Morine Kohlman, Joyice Faster, MD, 2 drop at 07/15/18 1802 .  primidone (MYSOLINE) tablet 50 mg, 50 mg, Oral, BID, Judith Part, MD, 50 mg at 07/16/18 2105 .  propranolol (INDERAL) tablet 40 mg, 40 mg, Oral, TID, Judith Part, MD, 40 mg at 07/16/18 2106 .  rosuvastatin (CRESTOR) tablet 20 mg, 20 mg, Oral, Once per day on Tue Thu, Onesha Krebbs, Joyice Faster, MD .  senna-docusate (Senokot-S) tablet 2 tablet, 2 tablet, Oral, BID, Judith Part, MD, 2 tablet at 07/15/18 2841   Physical Exam: AOx3, PERRL, EOMI, FS, TM Strength 5/5 in LUE/LLE RUE Strength: D 5/5, B 4/5, WE 4+/5, T 4+/5, G 4+/5 RLE Strength: HF 5/5, KE 5/5, DF 5/5, EHL 5/5, PF 5/5 SILT except R hand, sensation improved in R hand, almost symmetric now with left hand No hoffman's, no clonus  Assessment & Plan: 77 y.o. man s/p 4 level ACDF, recovering well, strength improved, radicular pain resolved, numbness improved. -from a neurosurgical perspective okay for discharge when pain controlled on a po regimen and patient is ambulating. Given his admission requirement prior to surgery, patient may require PT/OT to determine if acute rehab is needed  Judith Part  07/17/18 7:30 AM

## 2018-07-17 NOTE — Evaluation (Signed)
Physical Therapy Evaluation Patient Details Name: Edward [name] MRN: [id number] DOB: [birth year] Today's Date: 07/17/2018   History of Present Illness  pt is a 77 y/o male with pmh of CAD, CKD, DM2, CABG, HTN, admitted with neck pain, progressive right upper extremity weakness/numbness and radicular pain with worsening ambulation.  pt s/p C3-C7 ACDF.  Clinical Impression  Pt admitted with/for cervical myelopathy with above symptoms, s/p ACDF.  Pt requiring minimal assist for most mobility and gait.Marland Kitchen  Pt currently limited functionally due to the problems listed. ( See problems list.)   Pt will benefit from PT to maximize function and safety in order to get ready for next venue listed below.     Follow Up Recommendations SNF    Equipment Recommendations  None recommended by PT    Recommendations for Other Services       Precautions / Restrictions Precautions Precautions: Fall      Mobility  Bed Mobility               General bed mobility comments: OOB In the chair on arrival  Transfers Overall transfer level: Needs assistance   Transfers: Sit to/from Stand Sit to Stand: Min assist         General transfer comment: cues for hand placement, stability assist  Ambulation/Gait Ambulation/Gait assistance: Min assist Gait Distance (Feet): 150 Feet Assistive device: Rolling walker (2 wheeled) Gait Pattern/deviations: Step-through pattern     General Gait Details: generally steady, but guarded and slow with RW.  Cues for better use of the RW with better positioning in the RW  Stairs            Wheelchair Mobility    Modified Rankin (Stroke Patients Only)       Balance Overall balance assessment: Mild deficits observed, not formally tested                                           Pertinent Vitals/Pain Pain Assessment: Faces Faces Pain Scale: Hurts even more Pain Location: neck Pain Descriptors / Indicators: Grimacing;Guarding Pain  Intervention(s): Monitored during session    Home Living Family/patient expects to be discharged to:: Private residence Living Arrangements: Alone Available Help at Discharge: Family;Available PRN/intermittently Type of Home: House Home Access: Stairs to enter Entrance Stairs-Rails: Right Entrance Stairs-Number of Steps: 3 steps Home Layout: One level Home Equipment: Walker - 4 wheels;Cane - single point;Shower seat Additional Comments: brother could assist in a pinch, but will be more busy after this surgery.    Prior Function Level of Independence: Independent         Comments: Still driving; enjoys church; has a cane, does not use; house cleaner comes twice a month      Hand Dominance   Dominant Hand: Right    Extremity/Trunk Assessment   Upper Extremity Assessment Upper Extremity Assessment: Defer to OT evaluation    Lower Extremity Assessment Lower Extremity Assessment: Generalized weakness;Overall [facility] for tasks assessed    Cervical / Trunk Assessment Cervical / Trunk Assessment: Kyphotic  Communication   Communication: No difficulties  Cognition Arousal/Alertness: Awake/alert Behavior During Therapy: WFL for tasks assessed/performed Overall Cognitive Status: Within Functional Limits for tasks assessed  General Comments General comments (skin integrity, edema, etc.): Pt educated in cervical prec/care, lifting precautions, benefits/limitations of the soft colllar, progression of activity.    Exercises     Assessment/Plan    PT Assessment Patient needs continued PT services  PT Problem List Decreased strength;Decreased activity tolerance;Decreased balance;Decreased mobility;Decreased knowledge of use of DME;Decreased knowledge of precautions;Pain       PT Treatment Interventions DME instruction;Gait training;Functional mobility training;Therapeutic activities;Balance training;Patient/family education     PT Goals (Current goals can be found in the Care Plan section)  Acute Rehab PT Goals Patient Stated Goal: I think I need some further rehab before home this time. PT Goal Formulation: With patient Time For Goal Achievement: 07/24/18 Potential to Achieve Goals: Fair    Frequency Min 5X/week   Barriers to discharge Decreased caregiver support      Co-evaluation               AM-PAC PT "6 Clicks" Daily Activity  Outcome Measure Difficulty turning over in bed (including adjusting bedclothes, sheets and blankets)?: Unable Difficulty moving from lying on back to sitting on the side of the bed? : Unable Difficulty sitting down on and standing up from a chair with arms (e.g., wheelchair, bedside commode, etc,.)?: Unable Help needed moving to and from a bed to chair (including a wheelchair)?: A Little Help needed walking in hospital room?: A Little Help needed climbing 3-5 steps with a railing? : A Little 6 Click Score: 12    End of Session   Activity Tolerance: Patient tolerated treatment well Patient left: in chair;with call bell/phone within reach;with nursing/sitter in room Nurse Communication: Mobility status PT Visit Diagnosis: Other abnormalities of gait and mobility (R26.89);Muscle weakness (generalized) (M62.81)    Time: 4562-5638 PT Time Calculation (min) (ACUTE ONLY): 32 min   Charges:   PT Evaluation $PT Eval Moderate Complexity: 1 Mod PT Treatments $Gait Training: 8-22 mins        07/17/2018  Donnella Sham, PT Acute Rehabilitation Services [phone]  (pager) [phone]  (office)  Tessie Fass Cy Bresee 07/17/2018, 6:00 PM

## 2018-07-17 NOTE — Progress Notes (Signed)
Text sent to Dr Sloan Leiter for patient's request for SNF at discharge and request to d/c tele.

## 2018-07-17 NOTE — Progress Notes (Signed)
Pt refusing CPAP for the night. RT will continue to monitor as needed.  

## 2018-07-18 LAB — GLUCOSE, CAPILLARY
GLUCOSE-CAPILLARY: 168 mg/dL — AB (ref 70–99)
Glucose-Capillary: 198 mg/dL — ABNORMAL HIGH (ref 70–99)
Glucose-Capillary: 272 mg/dL — ABNORMAL HIGH (ref 70–99)
Glucose-Capillary: 238 mg/dL — ABNORMAL HIGH (ref 70–99)

## 2018-07-18 LAB — BASIC METABOLIC PANEL
ANION GAP: 9 (ref 5–15)
BUN: 11 mg/dL (ref 8–23)
CHLORIDE: 98 mmol/L (ref 98–111)
CO2: 30 mmol/L (ref 22–32)
Calcium: 8.5 mg/dL — ABNORMAL LOW (ref 8.9–10.3)
Creatinine, Ser: 0.98 mg/dL (ref 0.61–1.24)
GFR calc Af Amer: >60 mL/min (ref >60)
GFR calc non Af Amer: >60 mL/min (ref >60)
GLUCOSE: 150 mg/dL — AB (ref 70–99)
Potassium: 4.1 mmol/L (ref 3.5–5.1)
Sodium: 137 mmol/L (ref 135–145)

## 2018-07-18 MED ORDER — INSULIN DETEMIR 100 UNIT/ML ~~LOC~~ SOLN
20.0000 [IU] | Freq: Two times a day (BID) | SUBCUTANEOUS | Status: DC
  Filled 2018-07-18 (×4): qty 0.2

## 2018-07-18 MED ORDER — HYDROCOD POLST-CPM POLST ER 10-8 MG/5ML PO SUER
5.0000 mL | Freq: Four times a day (QID) | ORAL | Status: DC
  Administered 2018-07-18 – 2018-07-19 (×4): 5 mL via ORAL
  Filled 2018-07-18 (×4): qty 5

## 2018-07-18 MED ORDER — INSULIN ASPART 100 UNIT/ML ~~LOC~~ SOLN: 10.0000 [IU] | Freq: Three times a day (TID) | SUBCUTANEOUS | Status: DC

## 2018-07-18 NOTE — Clinical Social Work Note (Signed)
Clinical Social Work Assessment  Patient Details  Name: Edward [name] MRN: [id number] Date of Birth: 23-Dec-1940  Date of referral:  07/18/18               Reason for consult:  Facility Placement, Discharge Planning                Permission sought to share information with:  Facility Art therapist granted to share information::  Yes, Verbal Permission Granted  Name::        Agency::  SNFs  Relationship::     Contact Information:     Housing/Transportation Living arrangements for the past 2 months:  Single Family Home Source of Information:  Patient Patient Interpreter Needed:  None Criminal Activity/Legal Involvement Pertinent to Current Situation/Hospitalization:  No - Comment as needed Significant Relationships:  Siblings Lives with:  Self Do you feel safe going back to the place where you live?  Yes Need for family participation in patient care:  No (Coment)  Care giving concerns: Patient from home independently. PT recommending SNF.   Social Worker assessment / plan: CSW met with patient at bedside. Patient alert and oriented, sitting up in bedside chair. CSW introduced self and role and discussed disposition planning. Patient agreeable to SNF and reported he has been to [place] in the past. Patient prefers to return to [place]. CSW answered patient's questions about insurance coverage for the SNF stay. CSW will provide bed offers when available and follow to support with discharge planning.  Employment status:  Retired Forensic scientist:  [employer] PT Recommendations:  [place] / Referral to community resources:  [place]  Patient/Family's Response to care: Patient appreciative of care.  Patient/Family's Understanding of and Emotional Response to Diagnosis, Current Treatment, and Prognosis: Patient with understanding of his conditions and agreeable to SNF.  Emotional Assessment Appearance:  Appears  stated age Attitude/Demeanor/Rapport:  Engaged Affect (typically observed):  Accepting, Calm, Appropriate Orientation:  Oriented to Self, Oriented to Place, Oriented to  Time, Oriented to Situation Alcohol / Substance use:  Not Applicable Psych involvement (Current and /or in the community):  No (Comment)  Discharge Needs  Concerns to be addressed:  Discharge Planning Concerns, Care Coordination Readmission within the last 30 days:  Yes Current discharge risk:  Physical Impairment, Lives alone Barriers to Discharge:  Continued Medical Work up   Edward Emms, LCSW 07/18/2018, 11:31 AM

## 2018-07-18 NOTE — Progress Notes (Signed)
Pt had hypoglycemic episode during day after receiving 40units levemir. Pt has poor appetite, HS FS 140, pt scheduled to get 40 units levemir inform Dr. Hilbert Bible, new order for tonight to give 20 units levemir. Order carried out.

## 2018-07-18 NOTE — NC FL2 (Signed)
[place] LEVEL OF CARE SCREENING TOOL     IDENTIFICATION  Patient Name: Edward [name] Birthdate: 1941/03/19 Sex: male Admission Date (Current Location): 07/13/2018  [facility] and [place] Number:  Herbalist and Address:  [facility], [place] [address] [place], [place] [address]      Provider Number: [id number]  Attending Physician Name and Address:  Deatra James, MD  Relative Name and Phone Number:  Russ Looper; brother; [phone]    Current Level of Care: Hospital Recommended Level of Care: [name] Prior Approval Number:    Date Approved/Denied:   PASRR Number: [id number]  Discharge Plan: SNF    Current Diagnoses: Patient Active Problem List   Diagnosis Date Noted  . Radiculopathy 07/13/2018  . Cervical spinal stenosis 07/10/2018  . Cervical radiculopathy 07/10/2018  . Cellulitis of left anterior lower leg 06/19/2018  . Numbness and tingling in both hands 06/19/2018  . Chronic daily headache 04/23/2018  . Palpitations 03/28/2018  . Anxiety 07/24/2017  . Atelectasis   . Coronary artery disease involving coronary bypass graft of native heart with angina pectoris [name]   . Hypertensive heart disease without heart failure   . Chronic kidney disease, stage III (moderate) ([place]) 11/03/2016  . Chronic diastolic heart failure ([place])   . Near syncope 04/19/2016  . Diabetes mellitus ([place]) 03/11/2016  . GERD (gastroesophageal reflux disease) 08/27/2015  . Acute bronchitis 08/27/2015  . SOB (shortness of breath) 07/08/2015  . Obstructive sleep apnea 06/22/2015  . Essential tremor 04/23/2015  . Impacted cerumen of right ear 11/20/2014  . Hyperlipidemia LDL goal <70 01/29/2014  . Essential hypertension 01/29/2014    Orientation RESPIRATION BLADDER Height & Weight     Self, Time, Situation, Place  Normal Continent Weight: 279 lb 15.8 oz (127 kg) Height:  5\' 8"  (172.7 cm)  BEHAVIORAL SYMPTOMS/MOOD  NEUROLOGICAL BOWEL NUTRITION STATUS      Continent Diet(see discharge summary)  AMBULATORY STATUS COMMUNICATION OF NEEDS Skin   Limited Assist Verbally Surgical wounds, Other (Comment)(incision on neck with honeycomb dressing; jp drain)                       Personal Care Assistance Level of Assistance  Bathing, Feeding, Dressing Bathing Assistance: Limited assistance Feeding assistance: Independent Dressing Assistance: Limited assistance     Functional Limitations Info  Sight, Hearing, Speech Sight Info: Adequate Hearing Info: Adequate Speech Info: Adequate    SPECIAL CARE FACTORS FREQUENCY  PT (By licensed PT), OT (By licensed OT)     PT Frequency: 5x week OT Frequency: 5x week            Contractures Contractures Info: Not present    Additional Factors Info  Code Status, Allergies, Insulin Sliding Scale Code Status Info: Full Code Allergies Info: PNEUMOCOCCAL VACCINES    Insulin Sliding Scale Info: insulin aspart (novoLOG) injection 0-20 Units 3x daily with meals; insulin aspart (novoLOG) injection 0-5 Units daily at bedtime; insulin aspart (novoLOG) injection 30 Units 3x daily; insulin detemir (LEVEMIR) injection 20 Units 2x daily       Current Medications (07/18/2018):  This is the current hospital active medication list Current Facility-Administered Medications  Medication Dose Route Frequency Provider Last Rate Last Dose  . amLODipine (NORVASC) tablet 10 mg  10 mg Oral Daily Judith Part, MD   10 mg at 07/17/18 0923  . bismuth subsalicylate (PEPTO BISMOL) 262 MG/15ML suspension 30 mL  30 mL Oral  BID PRN Judith Part, MD      . clonazePAM Bobbye [place]) tablet 1 mg  1 mg Oral QHS PRN Judith Part, MD   1 mg at 07/14/18 2235  . cyclobenzaprine (FLEXERIL) tablet 5 mg  5 mg Oral QHS PRN Judith Part, MD   5 mg at 07/16/18 2105  . diclofenac sodium (VOLTAREN) 1 % transdermal gel 2 g  2 g Topical QID PRN Judith Part, MD      .  dicyclomine (BENTYL) capsule 10 mg  10 mg Oral BID Judith Part, MD   10 mg at 07/17/18 2223  . erythromycin ophthalmic ointment 1 application  1 application Both Eyes Daily PRN Judith Part, MD      . furosemide (LASIX) tablet 60 mg  60 mg Oral Daily Judith Part, MD   60 mg at 07/17/18 0921  . gabapentin (NEURONTIN) capsule 300 mg  300 mg Oral BID Judith Part, MD   300 mg at 07/17/18 2223  . hydrALAZINE (APRESOLINE) tablet 25 mg  25 mg Oral Q8H Judith Part, MD   25 mg at 07/18/18 3664  . HYDROmorphone (DILAUDID) injection 0.5 mg  0.5 mg Intravenous Q3H PRN Judith Part, MD   0.5 mg at 07/17/18 2041  . insulin aspart (novoLOG) injection 0-20 Units  0-20 Units Subcutaneous TID WC Judith Part, MD   7 Units at 07/17/18 1256  . insulin aspart (novoLOG) injection 0-5 Units  0-5 Units Subcutaneous QHS Judith Part, MD   2 Units at 07/15/18 2133  . insulin aspart (novoLOG) injection 30 Units  30 Units Subcutaneous TID AC Judith Part, MD   30 Units at 07/17/18 1255  . insulin detemir (LEVEMIR) injection 20 Units  20 Units Subcutaneous BID Shahmehdi, Seyed A, MD      . lactated ringers infusion   Intravenous Continuous Belinda Block, MD 10 mL/hr at 07/16/18 0854    . meclizine (ANTIVERT) tablet 25 mg  25 mg Oral BID PRN Judith Part, MD      . nitroGLYCERIN (NITROSTAT) SL tablet 0.4-0.8 mg  0.4-0.8 mg Sublingual Q5 Min x 3 PRN Judith Part, MD      . oxyCODONE-acetaminophen (PERCOCET/ROXICET) 5-325 MG per tablet 1-2 tablet  1-2 tablet Oral Q4H PRN Judith Part, MD   2 tablet at 07/17/18 1256  . pantoprazole (PROTONIX) EC tablet 40 mg  40 mg Oral Daily Judith Part, MD   40 mg at 07/17/18 0926  . polyethylene glycol (MIRALAX / GLYCOLAX) packet 17 g  17 g Oral Daily Judith Part, MD   17 g at 07/17/18 0940  . potassium chloride (K-DUR,KLOR-CON) CR tablet 30 mEq  30 mEq Oral Daily Judith Part, MD   30  mEq at 07/17/18 4034  . prednisoLONE acetate (PRED FORTE) 1 % ophthalmic suspension 2 drop  2 drop Both Eyes TID AC & HS Judith Part, MD   2 drop at 07/15/18 1802  . primidone (MYSOLINE) tablet 50 mg  50 mg Oral BID Judith Part, MD   50 mg at 07/17/18 2225  . propranolol (INDERAL) tablet 40 mg  40 mg Oral TID Judith Part, MD   40 mg at 07/17/18 2227  . rosuvastatin (CRESTOR) tablet 20 mg  20 mg Oral Once per day on Tue Thu Judith Part, MD   20 mg at 07/17/18 2244  . senna-docusate (Senokot-S) tablet 2 tablet  2  tablet Oral BID Judith Part, MD   2 tablet at 07/17/18 2223     Discharge Medications: Please see discharge summary for a list of discharge medications.  Relevant Imaging Results:  Relevant Lab Results:   Additional Information [id number] [place], [place]

## 2018-07-18 NOTE — Progress Notes (Signed)
Physical Therapy Treatment Patient Details Name: Edward [name] MRN: [id number] DOB: [birth year] Today's Date: 07/18/2018    History of Present Illness pt is a 77 y/o male with pmh of CAD, CKD, DM2, CABG, HTN, admitted with neck pain, progressive right upper extremity weakness/numbness and radicular pain with worsening ambulation.  pt s/p C3-C7 ACDF.    PT Comments    Progressing steadily.  Still significantly unsteady upon initial stand, but stability improves after mobilizing in the RW for 10-15 feet.    Follow Up Recommendations  SNF     Equipment Recommendations  None recommended by PT    Recommendations for Other Services       Precautions / Restrictions Precautions Precautions: Fall;Cervical    Mobility  Bed Mobility               General bed mobility comments: OOB In the chair on arrival  Transfers Overall transfer level: Needs assistance Equipment used: Rolling walker (2 wheeled) Transfers: Sit to/from Stand Sit to Stand: Min assist         General transfer comment: cues for hand placement, stability assist--pt has an initial tendency to list backward moderately  Ambulation/Gait Ambulation/Gait assistance: Min guard Gait Distance (Feet): 300 Feet Assistive device: Rolling walker (2 wheeled) Gait Pattern/deviations: Step-through pattern Gait velocity: decreased  Gait velocity interpretation: 1.31 - 2.62 ft/sec, indicative of limited community ambulator General Gait Details: steady, but tentative.  Gets outside the [place] during turns.   Stairs             Wheelchair Mobility    Modified Rankin (Stroke Patients Only)       Balance Overall balance assessment: Mild deficits observed, not formally tested                                          Cognition Arousal/Alertness: Awake/alert Behavior During Therapy: WFL for tasks assessed/performed Overall Cognitive Status: Within Functional Limits for tasks assessed                                        Exercises      General Comments General comments (skin integrity, edema, etc.): Reinforced cervical precautions, progression of activity.      Pertinent Vitals/Pain Pain Assessment: Faces Faces Pain Scale: Hurts even more Pain Location: neck Pain Descriptors / Indicators: Grimacing;Guarding    Home Living Family/patient expects to be discharged to:: Skilled nursing facility Living Arrangements: Alone Available Help at Discharge: Family;Available PRN/intermittently Type of Home: House Home Access: Stairs to enter Entrance Stairs-Rails: Right Home Layout: One level Home Equipment: Walker - 4 wheels;Cane - single point;Shower seat Additional Comments: brother could assist in a pinch, but will be more busy after this surgery.    Prior Function Level of Independence: Independent      Comments: Still driving; enjoys church; has a cane, does not use; house cleaner comes twice a month    PT Goals (current goals can now be found in the care plan section) Acute Rehab PT Goals Patient Stated Goal: I think I need some further rehab before home this time. PT Goal Formulation: With patient Time For Goal Achievement: 07/24/18 Potential to Achieve Goals: Fair Progress towards PT goals: Progressing toward goals    Frequency    Min 5X/week  PT Plan Current plan remains appropriate    Co-evaluation              AM-PAC PT "6 Clicks" Daily Activity  Outcome Measure  Difficulty turning over in bed (including adjusting bedclothes, sheets and blankets)?: Unable Difficulty moving from lying on back to sitting on the side of the bed? : Unable Difficulty sitting down on and standing up from a chair with arms (e.g., wheelchair, bedside commode, etc,.)?: Unable Help needed moving to and from a bed to chair (including a wheelchair)?: A Little Help needed walking in hospital room?: A Little Help needed climbing 3-5 steps with a  railing? : A Little 6 Click Score: 12    End of Session   Activity Tolerance: Patient tolerated treatment well Patient left: in chair;with call bell/phone within reach;with nursing/sitter in room Nurse Communication: Mobility status PT Visit Diagnosis: Other abnormalities of gait and mobility (R26.89);Muscle weakness (generalized) (M62.81)     Time: 0370-4888 PT Time Calculation (min) (ACUTE ONLY): 12 min  Charges:  $Gait Training: 8-22 mins                     07/18/2018  Donnella Sham, PT Acute Rehabilitation Services [phone]  (pager) [phone]  (office)   Tessie Fass Melecio Cueto 07/18/2018, 6:00 PM

## 2018-07-18 NOTE — Discharge Instructions (Signed)
Discharge Instructions  No restriction in activities, slowly increase your activity back to normal.   Your incision is closed with dermabond (purple glue). This will naturally fall off over the next 1-2 weeks.   Okay to shower on the day of discharge. Be gentle when cleaning your incision. Use regular soap and water. If that is uncomfortable, try using baby shampoo. Do not submerge the wound under water for 2 weeks after surgery.

## 2018-07-18 NOTE — Progress Notes (Addendum)
Neurosurgery Service Progress Note  Subjective: No acute events overnight, some dysphagia as expected, but patient is unable to give a good diet history. He thinks he has difficulty with thin liquids, but says that oatmeal goes down well. Either way, he is tolerating swallowing with some odynophagia but function is preserved. His RUE radicular pain and weakness are resolved, no new weakness/numbness/parasthesias.  Objective: Vitals:   07/17/18 2253 07/18/18 0400 07/18/18 0607 07/18/18 0730  BP: (!) 166/68 (!) 150/68 (!) 138/54 (!) 151/65  Pulse: 72   67  Resp: 15 15  12   Temp: 98.4 F (36.9 C) 98 F (36.7 C)  98 F (36.7 C)  TempSrc: Oral Oral  Oral  SpO2: 98% 99%  99%  Weight:      Height:       Temp (24hrs), Avg:98 F (36.7 C), Min:97.6 F (36.4 C), Max:98.4 F (36.9 C)  CBC Latest Ref Rng & Units 07/16/2018 07/16/2018 07/13/2018  WBC 4.0 - 10.5 K/uL 10.8(H) 11.3(H) 6.1  Hemoglobin 13.0 - 17.0 g/dL 13.7 12.9(L) 12.6(L)  Hematocrit 39.0 - 52.0 % 43.4 40.7 39.9  Platelets 150 - 400 K/uL 244 274 252   BMP Latest Ref Rng & Units 07/18/2018 07/16/2018 07/13/2018  Glucose 70 - 99 mg/dL 150(H) 111(H) 245(H)  BUN 8 - 23 mg/dL 11 22 18   Creatinine 0.61 - 1.24 mg/dL 0.98 1.22 1.25(H)  BUN/Creat Ratio 10 - 24 - - -  Sodium 135 - 145 mmol/L 137 140 135  Potassium 3.5 - 5.1 mmol/L 4.1 4.1 4.5  Chloride 98 - 111 mmol/L 98 103 100  CO2 22 - 32 mmol/L 30 30 26   Calcium 8.9 - 10.3 mg/dL 8.5(L) 9.0 8.5(L)    Intake/Output Summary (Last 24 hours) at 07/18/2018 0735 Last data filed at 07/18/2018 0400 Gross per 24 hour  Intake 240 ml  Output 2220 ml  Net -1980 ml    Current Facility-Administered Medications:  .  amLODipine (NORVASC) tablet 10 mg, 10 mg, Oral, Daily, Judith Part, MD, 10 mg at 07/17/18 0923 .  bismuth subsalicylate (PEPTO BISMOL) 262 MG/15ML suspension 30 mL, 30 mL, Oral, BID PRN, Judith Part, MD .  clonazePAM (KLONOPIN) tablet 1 mg, 1 mg, Oral, QHS PRN,  Judith Part, MD, 1 mg at 07/14/18 2235 .  cyclobenzaprine (FLEXERIL) tablet 5 mg, 5 mg, Oral, QHS PRN, Judith Part, MD, 5 mg at 07/16/18 2105 .  diclofenac sodium (VOLTAREN) 1 % transdermal gel 2 g, 2 g, Topical, QID PRN, Judith Part, MD .  dicyclomine (BENTYL) capsule 10 mg, 10 mg, Oral, BID, Judith Part, MD, 10 mg at 07/17/18 2223 .  erythromycin ophthalmic ointment 1 application, 1 application, Both Eyes, Daily PRN, Brean Carberry A, MD .  furosemide (LASIX) tablet 60 mg, 60 mg, Oral, Daily, Judith Part, MD, 60 mg at 07/17/18 0921 .  gabapentin (NEURONTIN) capsule 300 mg, 300 mg, Oral, BID, Brittnei Jagiello A, MD, 300 mg at 07/17/18 2223 .  hydrALAZINE (APRESOLINE) tablet 25 mg, 25 mg, Oral, Q8H, Ghada Abbett, Joyice Faster, MD, 25 mg at 07/18/18 2947 .  HYDROmorphone (DILAUDID) injection 0.5 mg, 0.5 mg, Intravenous, Q3H PRN, Judith Part, MD, 0.5 mg at 07/17/18 2041 .  insulin aspart (novoLOG) injection 0-20 Units, 0-20 Units, Subcutaneous, TID WC, Delmar Dondero, Joyice Faster, MD, 7 Units at 07/17/18 1256 .  insulin aspart (novoLOG) injection 0-5 Units, 0-5 Units, Subcutaneous, QHS, Judith Part, MD, 2 Units at 07/15/18 2133 .  insulin aspart (novoLOG)  injection 30 Units, 30 Units, Subcutaneous, TID AC, Judith Part, MD, 30 Units at 07/17/18 1255 .  insulin detemir (LEVEMIR) injection 40 Units, 40 Units, Subcutaneous, BID, Judith Part, MD, 20 Units at 07/17/18 2337 .  lactated ringers infusion, , Intravenous, Continuous, Belinda Block, MD, Last Rate: 10 mL/hr at 07/16/18 0854 .  meclizine (ANTIVERT) tablet 25 mg, 25 mg, Oral, BID PRN, Judith Part, MD .  nitroGLYCERIN (NITROSTAT) SL tablet 0.4-0.8 mg, 0.4-0.8 mg, Sublingual, Q5 Min x 3 PRN, Sonji Starkes A, MD .  oxyCODONE-acetaminophen (PERCOCET/ROXICET) 5-325 MG per tablet 1-2 tablet, 1-2 tablet, Oral, Q4H PRN, Judith Part, MD, 2 tablet at 07/17/18 1256 .   pantoprazole (PROTONIX) EC tablet 40 mg, 40 mg, Oral, Daily, Judith Part, MD, 40 mg at 07/17/18 0926 .  polyethylene glycol (MIRALAX / GLYCOLAX) packet 17 g, 17 g, Oral, Daily, Judith Part, MD, 17 g at 07/17/18 0940 .  potassium chloride (K-DUR,KLOR-CON) CR tablet 30 mEq, 30 mEq, Oral, Daily, Judith Part, MD, 30 mEq at 07/17/18 2094 .  prednisoLONE acetate (PRED FORTE) 1 % ophthalmic suspension 2 drop, 2 drop, Both Eyes, TID AC & HS, Keviana Guida, Joyice Faster, MD, 2 drop at 07/15/18 1802 .  primidone (MYSOLINE) tablet 50 mg, 50 mg, Oral, BID, Judith Part, MD, 50 mg at 07/17/18 2225 .  propranolol (INDERAL) tablet 40 mg, 40 mg, Oral, TID, Judith Part, MD, 40 mg at 07/17/18 2227 .  rosuvastatin (CRESTOR) tablet 20 mg, 20 mg, Oral, Once per day on Tue Thu, Elber Galyean A, MD, 20 mg at 07/17/18 2244 .  senna-docusate (Senokot-S) tablet 2 tablet, 2 tablet, Oral, BID, Judith Part, MD, 2 tablet at 07/17/18 2223   Physical Exam: AOx3, PERRL, EOMI, FS, TM Strength 5/5 in LUE/LLE, Strength 5/5 in RUE/RLE SILTx4 No hoffman's, no clonus  Assessment & Plan: 77 y.o. man s/p 4 level ACDF, recovering well, strength improved, radicular pain resolved, numbness improved. -d/c drain -appears likely he will be discharged to SNF -ambulate tid as tolerated -as able, please try to track oral fluid intake to make sure patient is tolerating enough fluid intake prior to discharge -ordinarily I would prescribe a short course of steroids to help shorten the duration of post-operative dysphagia. However, given the significant efforts of the hospitalist team to normalize his blood sugars, I think that we should hold off as long as he is tolerating oral fluid intake. -wound care and activity instructions placed in the patient's discharge instructions in [place]  07/18/18 7:35 AM

## 2018-07-18 NOTE — Evaluation (Signed)
Occupational Therapy Evaluation Patient Details Name: Edward [name] MRN: [id number] DOB: 10/14/1941 Today's Date: 07/18/2018    History of Present Illness pt is a 77 y/o male with pmh of CAD, CKD, DM2, CABG, HTN, admitted with neck pain, progressive right upper extremity weakness/numbness and radicular pain with worsening ambulation.  pt s/p C3-C7 ACDF.   Clinical Impression   Pt admitted with the above. Pt currently with functional limitations due to the deficits listed below (see OT Problem List).  Pt will benefit from skilled OT to increase their safety and independence with ADL and functional mobility for ADL to facilitate discharge to venue listed below.      Follow Up Recommendations  SNF    Equipment Recommendations  None recommended by OT    Recommendations for Other Services       Precautions / Restrictions Precautions Precautions: Fall      Mobility Bed Mobility               General bed mobility comments: OOB In the chair on arrival  Transfers Overall transfer level: Needs assistance Equipment used: Rolling walker (2 wheeled) Transfers: Sit to/from Stand Sit to Stand: Mod assist         General transfer comment: cues for hand placement, stability assist        ADL either performed or assessed with clinical judgement   ADL Overall ADL's : Needs assistance/impaired     Grooming: Wash/dry hands;Wash/dry face;Oral care;Sitting;Standing;Min Teacher, music: Moderate assistance;RW Toilet Transfer Details (indicate cue type and reason): sit to stand with walker and urinal Toileting- Clothing Manipulation and Hygiene: Moderate assistance;Sit to/from stand;Cueing for sequencing;Cueing for safety         General ADL Comments: pt plans to go to SNF for rehab.       Vision Baseline Vision/History: Wears glasses Wears Glasses: Reading only Patient Visual Report: No change from baseline              Pertinent  Vitals/Pain Pain Assessment: No/denies pain     Hand Dominance Right   Extremity/Trunk Assessment Upper Extremity Assessment Upper Extremity Assessment: Generalized weakness           Communication Communication Communication: No difficulties   Cognition Arousal/Alertness: Awake/alert Behavior During Therapy: WFL for tasks assessed/performed Overall Cognitive Status: Within Functional Limits for tasks assessed                                                Home Living Family/patient expects to be discharged to:: Skilled nursing facility Living Arrangements: Alone Available Help at Discharge: Family;Available PRN/intermittently Type of Home: House Home Access: Stairs to enter [employer] of Steps: 3 steps Entrance Stairs-Rails: Right Home Layout: One level     Bathroom Shower/Tub: Tub/shower unit;Walk-in shower   Bathroom Toilet: Standard     Home Equipment: Environmental consultant - 4 wheels;Cane - single point;Shower seat   Additional Comments: brother could assist in a pinch, but will be more busy after this surgery.      Prior Functioning/Environment Level of Independence: Independent        Comments: Still driving; enjoys church; has a cane, does not use; house cleaner comes twice a month         OT Problem List:  Decreased strength;Decreased knowledge of use of DME or AE;Obesity;Decreased safety awareness;Impaired balance (sitting and/or standing)      OT Treatment/Interventions: Self-care/ADL training;Patient/family education;Therapeutic activities    OT Goals(Current goals can be found in the care plan section) Acute Rehab OT Goals Patient Stated Goal: I think I need some further rehab before home this time. ADL Goals Pt Will Perform Lower Body Bathing: with supervision;sit to/from stand Pt Will Perform Lower Body Dressing: with supervision;sit to/from stand Pt Will Transfer to Toilet: with supervision;ambulating;bedside commode Pt  Will Perform Toileting - Clothing Manipulation and hygiene: with supervision;sit to/from stand  OT Frequency: Min 2X/week   Barriers to D/C: Decreased caregiver support             AM-PAC PT "6 Clicks" Daily Activity     Outcome Measure Help from another person eating meals?: A Little Help from another person taking care of personal grooming?: A Little Help from another person toileting, which includes using toliet, bedpan, or urinal?: A Lot Help from another person bathing (including washing, rinsing, drying)?: A Lot Help from another person to put on and taking off regular upper body clothing?: A Little Help from another person to put on and taking off regular lower body clothing?: A Lot 6 Click Score: 15   End of Session Equipment Utilized During Treatment: Rolling walker Nurse Communication: Mobility status  Activity Tolerance: Patient tolerated treatment well Patient left: in chair;with call bell/phone within reach  OT Visit Diagnosis: Unsteadiness on feet (R26.81);Other abnormalities of gait and mobility (R26.89);Muscle weakness (generalized) (M62.81)                Time: 9166-0600 OT Time Calculation (min): 15 min Charges:  OT General Charges $OT Visit: 1 Visit OT Evaluation $OT Eval Moderate Complexity: 1 Mod  Kari Baars, OT Acute Rehabilitation Services [phone] Office- [phone], [name] 07/18/2018, 2:54 PM

## 2018-07-18 NOTE — Progress Notes (Signed)
PROGRESS NOTE    Patient: Edward [name]                            PCP: Ann Held, DO                    DOB: 1941-01-14            DOA: 07/13/2018 YHC:[id number]             DOS: 07/18/2018, 11:59 AM   LOS: 5 days   Date of Service: The patient was seen and examined on 07/18/2018  Subjective:   Patient was seen and examined this morning, stable no acute distress.  Complaining of sputum production with suction in his hand. Denies of having any chest pain or shortness of breath. Neurosurgery DC'd neck drain today,   Brief Narrative:   Patient is a 77 y.o. male with prior history of CAD status post CABG, chronic systolic heart failure, insulin-dependent diabetes who was just discharged from the hospital on 9/12 after he was evaluated for right upper extremity weakness/numbness and right radicular pain-he was found to have severe cervical stenosis resulting in cord compression-since he was on antiplatelet agents-which were held-plans were for outpatient cervical spine decompression, however patient presented to the hospital on 9/13 with constant neck pain, and associated right upper extremity numbness, patient was reevaluated by neurosurgery and underwent cervical spine decompression/discectomy on 9/16.    See below for further details.   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  Insulin-dependent diabetes:    CBGs fluctuating-no longer on steroids -Hypoglycemic state, lab review will be reduced from 40 to 20 units twice daily, will also reduce NovoLog from 30 to 10 units with meals, with SSI   Cough and sputum production  -PRN Tussionex  -PRN suction  chronic diastolic heart failure:  -Stable Volume status stable.  Continue Lasix-follow electrolytes, weights and intake output.  CAD: Stable, no anginal symptoms: Resume antiplatelets when okay with neurosurgery.  Hypertension:  Stable, titrating the meds of propranolol, hydralazine and amlodipine, for optimal blood pressure control  Dyslipidemia: Continue statin  GERD: Continue PPI  CKD stage II: Creatinine close to usual baseline  OSA: CPAP nightly  DVT Prophylaxis: SCD's  Code Status: Full code  Family Communication: None at bedside  Disposition Plan: Remain inpatient -PT Education officer, museum.  Patient meets the criteria for SNF -We will discharge as soon as insurance approval, facility available with bed     Procedures:    Status post cervical spine decompression on 9/16  Antimicrobials:  Anti-infectives (From admission, onward)   Start     Dose/Rate Route Frequency Ordered Stop   07/16/18 1445  ceFAZolin (ANCEF) 3 g in dextrose 5 % 50 mL IVPB  Status:  Discontinued     3 g 100 mL/hr over 30 Minutes Intravenous To Surgery 07/16/18 1436 07/16/18 1959   07/16/18  1200  bacitracin 50,000 Units in sodium chloride 0.9 % 500 mL irrigation  Status:  Discontinued       As needed 07/16/18 1201 07/16/18 1727       Objective:   Vitals:   07/18/18 0400 07/18/18 0607 07/18/18 0730 07/18/18 1129  BP: (!) 150/68 (!) 138/54 (!) 151/65 (!) 147/65  Pulse:   67   Resp: 15  12 20   Temp: 98 F (36.7 C)  98 F (36.7 C) 99.6 F (37.6 C)  TempSrc: Oral  Oral Oral  SpO2: 99%  99% 98%  Weight:      Height:        Intake/Output Summary (Last 24 hours) at 07/18/2018 1159 Last data filed at 07/18/2018 1129 Gross per 24  hour  Intake -  Output 970 ml  Net -970 ml   Filed Weights   07/13/18 1130  Weight: 127 kg     Examination:    General exam: Appears calm and comfortable  BP (!) 147/65 (BP Location: Right Arm)   Pulse 67   Temp 99.6 F (37.6 C) (Oral)   Resp 20   Ht 5\' 8"  (1.727 m)   Wt 127 kg   SpO2 98%   BMI 42.57 kg/m    Physical Exam  Constitution:  Alert, cooperative, no distress,  Psychiatric: Normal and stable mood and affect, cognition intact,   HEENT: Normocephalic, PERRL, otherwise with in Normal limits  Chest:Chest symmetric Cardio vascular:  S1/S2, RRR, No murmure, No Rubs or Gallops  pulmonary: Clear to auscultation bilaterally, respirations unlabored, negative wheezes / crackles Abdomen: Soft, non-tender, non-distended, bowel sounds,no masses, no organomegaly Muscular skeletal: Limited exam - in bed, able to move all 4 extremities, Normal strength,  Neuro: CNII-XII intact. , normal motor and sensation, reflexes intact  Extremities: No pitting edema lower extremities, +2 pulses  Skin: Dry, warm to touch, negative for any Rashes, No open wounds Wounds: per nursing documentation

## 2018-07-18 NOTE — Plan of Care (Signed)

## 2018-07-18 NOTE — Progress Notes (Addendum)
Inpatient Diabetes Program Recommendations  AACE/ADA: New Consensus Statement on Inpatient Glycemic Control (2015)  Target Ranges:  Prepandial:   less than 140 mg/dL      Peak postprandial:   less than 180 mg/dL (1-2 hours)      Critically ill patients:  140 - 180 mg/dL   Lab Results  Component Value Date   GLUCAP 140 (H) 07/17/2018   HGBA1C 9.5 (H) 06/19/2018    Review of Glycemic Control Results for Edward [name], Edward [name] (MRN [id number]) as of 07/18/2018 10:23  Ref. Range 07/17/2018 17:08 07/17/2018 17:54 07/17/2018 18:45 07/17/2018 21:38  Glucose-Capillary Latest Ref Range: 70 - 99 mg/dL 66 (L) 54 (L) 127 (H) 140 (H)   Diabetes history: Type 2 DM Outpatient Diabetes medications: NPH 30 units BID, Novolog 40 units TID Current orders for Inpatient glycemic control: Levemir 20 units BID, Novolog 30 units TID, Novolog  0-20 units TID, Novolog 0-5 units QHS  Inpatient Diabetes Program Recommendations:    Noted hypoglycemia yesterday following Levemir 40 units and 2 doses of Novolog 37 units. Noted decrease this AM of the Levemir to 20 units BID and meal coverage to Novolog 10 units TID.    Will continue to follow, no additional recommendations.  Thanks, Bronson Curb, MSN, RNC-OB Diabetes Coordinator [phone] (8a-5p)

## 2018-07-18 NOTE — Social Work (Signed)
CSW acknowledging recommendations for SNF, will complete assessment.   Alexander Mt, [place] Work [phone]

## 2018-07-18 NOTE — Care Management Important Message (Signed)
Important Message  Patient Details  Name: Edward [name] MRN: [id number] Date of Birth: February 08, 1941   Medicare Important Message Given:  Yes    [name] 07/18/2018, 3:18 PM

## 2018-07-19 ENCOUNTER — Inpatient Hospital Stay (HOSPITAL_COMMUNITY): Payer: Medicare Other

## 2018-07-19 ENCOUNTER — Other Ambulatory Visit: Payer: Self-pay

## 2018-07-19 ENCOUNTER — Other Ambulatory Visit: Payer: Self-pay | Admitting: Interventional Cardiology

## 2018-07-19 ENCOUNTER — Inpatient Hospital Stay (HOSPITAL_COMMUNITY): Payer: Medicare Other | Admitting: Certified Registered"

## 2018-07-19 DIAGNOSIS — M5412 Radiculopathy, cervical region: Secondary | ICD-10-CM

## 2018-07-19 DIAGNOSIS — G9341 Metabolic encephalopathy: Secondary | ICD-10-CM

## 2018-07-19 DIAGNOSIS — J9600 Acute respiratory failure, unspecified whether with hypoxia or hypercapnia: Secondary | ICD-10-CM

## 2018-07-19 DIAGNOSIS — Z794 Long term (current) use of insulin: Secondary | ICD-10-CM

## 2018-07-19 DIAGNOSIS — J9601 Acute respiratory failure with hypoxia: Secondary | ICD-10-CM

## 2018-07-19 DIAGNOSIS — E1159 Type 2 diabetes mellitus with other circulatory complications: Secondary | ICD-10-CM

## 2018-07-19 DIAGNOSIS — I469 Cardiac arrest, cause unspecified: Secondary | ICD-10-CM | POA: Diagnosis present

## 2018-07-19 LAB — CBC WITH DIFFERENTIAL/PLATELET
ABS IMMATURE GRANULOCYTES: 0 10*3/uL (ref 0.0–0.1)
Basophils Absolute: 0 10*3/uL (ref 0.0–0.1)
Basophils Relative: 0 %
Eosinophils Absolute: 0.2 10*3/uL (ref 0.0–0.7)
Eosinophils Relative: 2 %
HCT: 57.3 % — ABNORMAL HIGH (ref 39.0–52.0)
HEMOGLOBIN: 18.1 g/dL — AB (ref 13.0–17.0)
Immature Granulocytes: 0 %
LYMPHS ABS: 1.7 10*3/uL (ref 0.7–4.0)
LYMPHS PCT: 17 %
MCH: 28.1 pg (ref 26.0–34.0)
MCHC: 31.6 g/dL (ref 30.0–36.0)
MCV: 89 fL (ref 78.0–100.0)
MONO ABS: 0.9 10*3/uL (ref 0.1–1.0)
MONOS PCT: 8 %
NEUTROS ABS: 7.4 10*3/uL (ref 1.7–7.7)
Neutrophils Relative %: 73 %
PLATELETS: 145 10*3/uL — AB (ref 150–400)
RBC: 6.44 MIL/uL — ABNORMAL HIGH (ref 4.22–5.81)
RDW: 13.1 % (ref 11.5–15.5)
WBC: 10.2 10*3/uL (ref 4.0–10.5)

## 2018-07-19 LAB — COMPREHENSIVE METABOLIC PANEL
ALT: 105 U/L — AB (ref 0–44)
AST: 135 U/L — AB (ref 15–41)
Albumin: 3.2 g/dL — ABNORMAL LOW (ref 3.5–5.0)
Alkaline Phosphatase: 89 U/L (ref 38–126)
Anion gap: 16 — ABNORMAL HIGH (ref 5–15)
BUN: 15 mg/dL (ref 8–23)
CHLORIDE: 95 mmol/L — AB (ref 98–111)
CO2: 26 mmol/L (ref 22–32)
CREATININE: 1.3 mg/dL — AB (ref 0.61–1.24)
Calcium: 8.6 mg/dL — ABNORMAL LOW (ref 8.9–10.3)
GFR, EST AFRICAN AMERICAN: 60 mL/min — AB (ref >60)
GFR, EST NON AFRICAN AMERICAN: 52 mL/min — AB (ref >60)
Glucose, Bld: 318 mg/dL — ABNORMAL HIGH (ref 70–99)
Potassium: 4 mmol/L (ref 3.5–5.1)
Sodium: 137 mmol/L (ref 135–145)
Total Bilirubin: 1 mg/dL (ref 0.3–1.2)
Total Protein: 6.6 g/dL (ref 6.5–8.1)

## 2018-07-19 LAB — GLUCOSE, CAPILLARY
GLUCOSE-CAPILLARY: 249 mg/dL — AB (ref 70–99)
GLUCOSE-CAPILLARY: 321 mg/dL — AB (ref 70–99)
Glucose-Capillary: 288 mg/dL — ABNORMAL HIGH (ref 70–99)
Glucose-Capillary: 267 mg/dL — ABNORMAL HIGH (ref 70–99)
Glucose-Capillary: 269 mg/dL — ABNORMAL HIGH (ref 70–99)

## 2018-07-19 LAB — POCT I-STAT, CHEM 8
BUN: 19 mg/dL (ref 8–23)
BUN: 18 mg/dL (ref 8–23)
BUN: 16 mg/dL (ref 8–23)
CALCIUM ION: 1.12 mmol/L — AB (ref 1.15–1.40)
CHLORIDE: 96 mmol/L — AB (ref 98–111)
CREATININE: 1.1 mg/dL (ref 0.61–1.24)
CREATININE: 0.9 mg/dL (ref 0.61–1.24)
CREATININE: 0.8 mg/dL (ref 0.61–1.24)
Calcium, Ion: 1.13 mmol/L — ABNORMAL LOW (ref 1.15–1.40)
Calcium, Ion: 1.15 mmol/L (ref 1.15–1.40)
Chloride: 95 mmol/L — ABNORMAL LOW (ref 98–111)
Chloride: 97 mmol/L — ABNORMAL LOW (ref 98–111)
GLUCOSE: 350 mg/dL — AB (ref 70–99)
GLUCOSE: 348 mg/dL — AB (ref 70–99)
Glucose, Bld: 316 mg/dL — ABNORMAL HIGH (ref 70–99)
HCT: 41 % (ref 39.0–52.0)
HCT: 41 % (ref 39.0–52.0)
HCT: 41 % (ref 39.0–52.0)
HEMOGLOBIN: 13.9 g/dL (ref 13.0–17.0)
HEMOGLOBIN: 13.9 g/dL (ref 13.0–17.0)
Hemoglobin: 13.9 g/dL (ref 13.0–17.0)
POTASSIUM: 4.2 mmol/L (ref 3.5–5.1)
POTASSIUM: 3.9 mmol/L (ref 3.5–5.1)
Potassium: 4 mmol/L (ref 3.5–5.1)
Sodium: 135 mmol/L (ref 135–145)
Sodium: 136 mmol/L (ref 135–145)
Sodium: 137 mmol/L (ref 135–145)
TCO2: 34 mmol/L — AB (ref 22–32)
TCO2: 28 mmol/L (ref 22–32)
TCO2: 26 mmol/L (ref 22–32)

## 2018-07-19 LAB — POCT I-STAT 3, ART BLOOD GAS (G3+)
ACID-BASE EXCESS: 4 mmol/L — AB (ref 0.0–2.0)
BICARBONATE: 31 mmol/L — AB (ref 20.0–28.0)
O2 Saturation: 100 %
PH ART: 7.368 (ref 7.350–7.450)
PO2 ART: 466 mmHg — AB (ref 83.0–108.0)
TCO2: 33 mmol/L — AB (ref 22–32)
pCO2 arterial: 52.7 mmHg — ABNORMAL HIGH (ref 32.0–48.0)

## 2018-07-19 LAB — PROTIME-INR
INR: 1.16
Prothrombin Time: 14.7 seconds (ref 11.4–15.2)

## 2018-07-19 LAB — APTT: aPTT: 37 seconds — ABNORMAL HIGH (ref 24–36)

## 2018-07-19 LAB — LACTIC ACID, PLASMA: Lactic Acid, Venous: 5.7 mmol/L (ref 0.5–1.9)

## 2018-07-19 LAB — TROPONIN I

## 2018-07-19 LAB — MAGNESIUM: MAGNESIUM: 1.8 mg/dL (ref 1.7–2.4)

## 2018-07-19 MED ORDER — INSULIN DETEMIR 100 UNIT/ML ~~LOC~~ SOLN: 20.0000 [IU] | Freq: Two times a day (BID) | SUBCUTANEOUS | 11 refills | Status: AC

## 2018-07-19 MED ORDER — DEXAMETHASONE SODIUM PHOSPHATE 4 MG/ML IJ SOLN
2.0000 mg | Freq: Three times a day (TID) | INTRAMUSCULAR | Status: DC
  Administered 2018-07-20 – 2018-07-26 (×20): 2 mg via INTRAVENOUS
  Filled 2018-07-19 (×20): qty 1

## 2018-07-19 MED ORDER — GABAPENTIN 250 MG/5ML PO SOLN
300.0000 mg | Freq: Two times a day (BID) | ORAL | Status: DC
  Administered 2018-07-20 – 2018-07-22 (×6): 300 mg
  Filled 2018-07-19 (×9): qty 6

## 2018-07-19 MED ORDER — MIDAZOLAM HCL 2 MG/2ML IJ SOLN: 1.0000 mg | Freq: Once | INTRAMUSCULAR | Status: DC

## 2018-07-19 MED ORDER — ORAL CARE MOUTH RINSE
15.0000 mL | OROMUCOSAL | Status: DC
  Administered 2018-07-19 – 2018-07-26 (×64): 15 mL via OROMUCOSAL

## 2018-07-19 MED ORDER — INSULIN ASPART 100 UNIT/ML ~~LOC~~ SOLN: 10.0000 [IU] | Freq: Three times a day (TID) | SUBCUTANEOUS | 11 refills | Status: DC

## 2018-07-19 MED ORDER — MIDAZOLAM BOLUS VIA INFUSION
1.0000 mg | INTRAVENOUS | Status: DC | PRN
  Filled 2018-07-19: qty 1

## 2018-07-19 MED ORDER — FAMOTIDINE IN NACL 20-0.9 MG/50ML-% IV SOLN
20.0000 mg | Freq: Two times a day (BID) | INTRAVENOUS | Status: DC
  Administered 2018-07-19 – 2018-07-28 (×18): 20 mg via INTRAVENOUS
  Filled 2018-07-19 (×18): qty 50

## 2018-07-19 MED ORDER — SODIUM CHLORIDE 0.9 % IV SOLN
INTRAVENOUS | Status: DC
  Administered 2018-07-19: 2.9 [IU]/h via INTRAVENOUS
  Administered 2018-07-20: 7.4 [IU]/h via INTRAVENOUS
  Administered 2018-07-21: 7.7 [IU]/h via INTRAVENOUS
  Administered 2018-07-21: 11.9 [IU]/h via INTRAVENOUS
  Administered 2018-07-22: 3.5 [IU]/h via INTRAVENOUS
  Administered 2018-07-22: 7.8 [IU]/h via INTRAVENOUS
  Administered 2018-07-23: 10.8 [IU]/h via INTRAVENOUS
  Filled 2018-07-19 (×8): qty 1

## 2018-07-19 MED ORDER — HYDROCOD POLST-CPM POLST ER 10-8 MG/5ML PO SUER: 5.0000 mL | Freq: Four times a day (QID) | ORAL | 0 refills | Status: DC

## 2018-07-19 MED ORDER — FENTANYL CITRATE (PF) 100 MCG/2ML IJ SOLN: 50.0000 ug | Freq: Once | INTRAMUSCULAR | Status: DC

## 2018-07-19 MED ORDER — SODIUM CHLORIDE 0.9 % IV SOLN
2.0000 mg/h | INTRAVENOUS | Status: DC
  Administered 2018-07-19: 7 mg/h via INTRAVENOUS
  Administered 2018-07-19: 2 mg/h via INTRAVENOUS
  Administered 2018-07-20 – 2018-07-21 (×3): 5 mg/h via INTRAVENOUS
  Filled 2018-07-19 (×5): qty 10

## 2018-07-19 MED ORDER — SODIUM CHLORIDE 0.9 % IV SOLN
INTRAVENOUS | Status: DC
  Administered 2018-07-19 – 2018-07-21 (×4): via INTRAVENOUS
  Administered 2018-07-21: 100 mL/h via INTRAVENOUS
  Administered 2018-07-22: 16:00:00 via INTRAVENOUS
  Administered 2018-07-22: 100 mL/h via INTRAVENOUS
  Administered 2018-07-23: 03:00:00 via INTRAVENOUS
  Administered 2018-07-27: 10 mL/h via INTRAVENOUS

## 2018-07-19 MED ORDER — CISATRACURIUM BOLUS VIA INFUSION
0.1000 mg/kg | Freq: Once | INTRAVENOUS | Status: AC
  Administered 2018-07-19: 12.7 mg via INTRAVENOUS
  Filled 2018-07-19: qty 13

## 2018-07-19 MED ORDER — CISATRACURIUM BOLUS VIA INFUSION
0.0500 mg/kg | INTRAVENOUS | Status: DC | PRN
  Filled 2018-07-19: qty 7

## 2018-07-19 MED ORDER — FENTANYL 2500MCG IN NS 250ML (10MCG/ML) PREMIX INFUSION
0.0000 ug/h | INTRAVENOUS | Status: DC
  Administered 2018-07-19: 200 ug/h via INTRAVENOUS
  Administered 2018-07-20 (×2): 300 ug/h via INTRAVENOUS
  Administered 2018-07-20: 200 ug/h via INTRAVENOUS
  Administered 2018-07-21 – 2018-07-22 (×3): 300 ug/h via INTRAVENOUS
  Filled 2018-07-19 (×7): qty 250

## 2018-07-19 MED ORDER — NOREPINEPHRINE 4 MG/250ML-% IV SOLN
0.0000 ug/min | INTRAVENOUS | Status: DC
  Administered 2018-07-19: 5 ug/min via INTRAVENOUS
  Administered 2018-07-20: 8 ug/min via INTRAVENOUS
  Administered 2018-07-21: 14 ug/min via INTRAVENOUS
  Administered 2018-07-21: 5 ug/min via INTRAVENOUS
  Administered 2018-07-21: 14 ug/min via INTRAVENOUS
  Filled 2018-07-19 (×6): qty 250

## 2018-07-19 MED ORDER — INSULIN ASPART 100 UNIT/ML ~~LOC~~ SOLN: 0.0000 [IU] | Freq: Three times a day (TID) | SUBCUTANEOUS | 11 refills | Status: DC

## 2018-07-19 MED ORDER — CHLORHEXIDINE GLUCONATE 0.12% ORAL RINSE (MEDLINE KIT)
15.0000 mL | Freq: Two times a day (BID) | OROMUCOSAL | Status: DC
  Administered 2018-07-19 – 2018-07-26 (×14): 15 mL via OROMUCOSAL

## 2018-07-19 MED ORDER — INSULIN ASPART 100 UNIT/ML ~~LOC~~ SOLN: 0.0000 [IU] | Freq: Every day | SUBCUTANEOUS | 11 refills | Status: DC

## 2018-07-19 MED ORDER — SODIUM CHLORIDE 0.9 % IV SOLN
1.0000 ug/kg/min | INTRAVENOUS | Status: DC
  Administered 2018-07-19: 1 ug/kg/min via INTRAVENOUS
  Administered 2018-07-20 – 2018-07-21 (×2): 1.496 ug/kg/min via INTRAVENOUS
  Filled 2018-07-19 (×3): qty 20

## 2018-07-19 MED ORDER — DICYCLOMINE HCL 10 MG/5ML PO SOLN
10.0000 mg | Freq: Two times a day (BID) | ORAL | Status: DC
  Administered 2018-07-20 – 2018-08-07 (×34): 10 mg
  Filled 2018-07-19 (×40): qty 5

## 2018-07-19 MED ORDER — FENTANYL BOLUS VIA INFUSION
25.0000 ug | INTRAVENOUS | Status: DC | PRN
  Administered 2018-07-19 – 2018-07-22 (×3): 25 ug via INTRAVENOUS
  Filled 2018-07-19: qty 25

## 2018-07-19 MED ORDER — ARTIFICIAL TEARS OPHTHALMIC OINT
1.0000 "application " | TOPICAL_OINTMENT | Freq: Three times a day (TID) | OPHTHALMIC | Status: DC
  Administered 2018-07-19 – 2018-07-23 (×11): 1 via OPHTHALMIC
  Filled 2018-07-19: qty 3.5

## 2018-07-19 MED ORDER — ASPIRIN 300 MG RE SUPP
300.0000 mg | RECTAL | Status: AC
  Administered 2018-07-19: 300 mg via RECTAL
  Filled 2018-07-19: qty 1

## 2018-07-19 MED FILL — Medication: Qty: 1 | Status: AC

## 2018-07-19 NOTE — Procedures (Signed)
Arterial Catheter Insertion Procedure Note GEZA BERANEK [id number] [birth year]  Procedure: Insertion of Arterial Catheter  Indications: Blood pressure monitoring and Frequent blood sampling  Procedure Details Consent: Unable to obtain consent because of emergent medical necessity. Time Out: Verified patient identification, verified procedure, site/side was marked, verified correct patient position, special equipment/implants available, medications/allergies/relevent history reviewed, required imaging and test results available.  Performed  Maximum sterile technique was used including antiseptics, cap, gloves, gown, hand hygiene and mask. Skin prep: Chlorhexidine; local anesthetic administered 20 gauge catheter was inserted into right radial artery using the Seldinger technique. ULTRASOUND GUIDANCE USED: NO Evaluation Blood flow good; BP tracing good. Complications: No apparent complications.   Mcneil Sober 07/19/2018

## 2018-07-19 NOTE — Clinical Social Work Placement (Signed)
   CLINICAL SOCIAL WORK PLACEMENT  NOTE Eddie North RN to call report to [phone]  Date:  07/19/2018  Patient Details  Name: Edward [name] MRN: [id number] Date of Birth: [birth year]  Clinical Social Work is seeking post-discharge placement for this patient at the [place] level of care (*CSW will initial, date and re-position this form in  chart as items are completed):  Yes   Patient/family provided with [place] Work Department's list of facilities offering this level of care within the geographic area requested by the patient (or if unable, by the patient's family).  Yes   Patient/family informed of their freedom to choose among providers that offer the needed level of care, that participate in Medicare, Medicaid or managed care program needed by the patient, have an available bed and are willing to accept the patient.  Yes   Patient/family informed of [facility]'s ownership interest in [facility] and [facility], as well as of the fact that they are under no obligation to receive care at these facilities.  PASRR submitted to EDS on       PASRR number received on       Existing PASRR number confirmed on 07/18/18     FL2 transmitted to all facilities in geographic area requested by pt/family on 07/18/18     FL2 transmitted to all facilities within larger geographic area on       Patient informed that his/her managed care company has contracts with or will negotiate with certain facilities, including the following:        Yes   Patient/family informed of bed offers received.  Patient chooses bed at [facility]     Physician recommends and patient chooses bed at      Patient to be transferred to [place] [facility] on 07/19/18.  Patient to be transferred to facility by [facility]     Patient family notified on 07/19/18 of transfer.  Name of family member notified:  pt responsible for self     PHYSICIAN       Additional Comment:     _______________________________________________ Alexander Mt, [place] 07/19/2018, 11:15 AM

## 2018-07-19 NOTE — Code Documentation (Signed)
  Patient Name: Edward [name]   MRN: [id number]   Date of Birth/ Sex: Oct 16, 1941 , male      Admission Date: 07/13/2018  Attending Provider: Deatra James, MD  Primary Diagnosis: Cervical radiculopathy   Indication: Pt was in his usual state of health until this afternoon at approximately 4:45 pm when he was found down on the floor. He was pulseless. Code blue was subsequently called. At the time of arrival on scene, ACLS protocol was underway.   Technical Description:  - CPR performance duration:  3 minutes  - Was defibrillation or cardioversion used? no  - Was external pacer placed? no  - Was patient intubated pre/post CPR? yes   Medications Administered: Y = Yes; Blank = No Amiodarone    Atropine    Calcium    Epinephrine  y  Lidocaine    Magnesium    Norepinephrine    Phenylephrine    Sodium bicarbonate    Vasopressin     Post CPR evaluation:  - Final Status - Was patient successfully resuscitated ? Yes - What is current rhythm? NSR - What is current hemodynamic status? Stable. BP ~130/70  Miscellaneous Information:  - Labs sent, including: CMET, CBC, Trop, Lactic acid, Mag  - Primary team notified?  Yes-were available promptly at bedside  - Family Notified? Yes-brother contacted, en route  - Additional notes/ transfer status:  Patient transferred to 4N15 ICU. PCCM at bedside.      Billiejean Schimek, DO  07/19/2018, 5:23 PM

## 2018-07-19 NOTE — Progress Notes (Signed)
PT Cancellation Note  Patient Details Name: Edward [name] MRN: [id number] DOB: Apr 24, 1941   Cancelled Treatment:    Reason Eval/Treat Not Completed: Medical issues which prohibited therapy(Pt recently coded and transferred to ICU post code.  Therapy is not appropriate at this time, will check order and continue PT as it is appropriate.  )   Warden Buffa Eli Hose 07/19/2018, 5:21 PM .Governor Rooks, PTA Acute Rehabilitation Services Pager [phone] Office [phone]

## 2018-07-19 NOTE — Progress Notes (Signed)
Critical Lactic 5.7 reported to Dr. Jimmy Footman via Warren Lacy. No new orders at this time, will continue to monitor.

## 2018-07-19 NOTE — Care Management Note (Signed)
Case Management Note  Patient Details  Name: Edward [name] MRN: [id number] Date of Birth: 10-28-1941  Subjective/Objective:  Pt is a 77 y/o male with pmh of CAD, CKD, DM2, CABG, HTN, admitted with neck pain, progressive right upper extremity weakness/numbness and radicular pain with worsening ambulation.  Pt s/p C3-C7 ACDF on 07/16/18.                    Action/Plan: PT/OT recommending SNF for rehab at dc, and pt is agreeable to plan.  CSW to follow to facilitate dc to SNF upon medical stability.  Expected Discharge Date:  07/19/18               Expected Discharge Plan:  Skilled Nursing Facility  In-House Referral:  Clinical Social Work  Discharge planning Services  CM Consult  Post Acute Care Choice:    Choice offered to:     DME Arranged:    DME Agency:     HH Arranged:    [place] Agency:     Status of Service:  Completed, signed off  If discussed at [employer], dates discussed:    Additional Comments:  07/19/18 J. Marcile Fuquay, RN, BSN Pt medically stable for discharge today; plan dc to SNF, per CSW arrangements.  Ella Bodo, RN 07/19/2018, 3:25 PM

## 2018-07-19 NOTE — Plan of Care (Signed)

## 2018-07-19 NOTE — Progress Notes (Signed)
Pt reporting that he feels his swallowing is worse. 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 At this point, I think we will have to start administering steroids to speed up the recovery of his swallowing function. Since his po intake is decreased, hopefully his blood glucose will be easier to control.   -dex 2q8, will increase tomorrow morning if his blood glucose levels are within reason

## 2018-07-19 NOTE — Anesthesia Procedure Notes (Signed)
Procedure Name: Intubation Date/Time: 07/19/2018 4:45 PM Performed by: Myna Bright, CRNA Pre-anesthesia Checklist: Patient identified, Emergency Drugs available, Suction available and Patient being monitored Patient Re-evaluated:Patient Re-evaluated prior to induction Oxygen Delivery Method: Ambu bag Preoxygenation: Pre-oxygenation with 100% oxygen Laryngoscope Size: Glidescope and 4 Tube type: Subglottic suction tube Tube size: 7.5 mm Number of attempts: 1 Airway Equipment and Method: Video-laryngoscopy and Stylet Placement Confirmation: positive ETCO2,  ETT inserted through vocal cords under direct vision and breath sounds checked- equal and bilateral Secured at: 22 cm Tube secured with: Tape Dental Injury: Teeth and Oropharynx as per pre-operative assessment  Comments: Responded to code blue page. Arrived to patient's room 4N02 and patient was lying on the floor unresponsive. Patient had ROSC, but was still unresponsive and unable to protect his airway. RT at [facility] masking with ambu bag. DL x 1 with Glidescope, +color change on easy cap, +BBS. Care turned over to rapid response team and critical care.

## 2018-07-19 NOTE — Progress Notes (Signed)
Discharge for this patient has been cancel due to complaints of difficult swallowing, [facility] has been notified and will come see patient later this afternoon, Called back to inform [facility] patient wll not be discharging to facility today.

## 2018-07-19 NOTE — Progress Notes (Signed)
Neurosurgery Progress Note  Discussed with bedside nurse and chart reviewed regarding code this evening. Pt currently in CICU on post-arrest hypothermia protocol. CT C-spine and CTH reviewed, C-spine shows hardware in good position without hardware failure, no fracture or subluxation, no evidence of cervical hematoma, expected mild soft tissue swelling. CTH shows no evidence of ischemia at this early time point. Pt currently having a L IJ CVC placed by CICU team, but on a nimbex gtt so no significant neurologic exam is possible at this point. Troponins and rainbow panel drawn and pending.   -ETT in place and pt about to have OG tube placed, can d/c dexamethasone that I ordered earlier. His blood glucose is already difficult to manage and adding variance to his BG could worsen his neurologic outcome post-code. Given cooling protocol, will have ETT in place for a few days and therefore will probably be through the swelling period when extubation is considered -Please call me directly with any questions or concerns, [phone]. -Thank you to the entire team for your prompt action and continued efforts in taking care of our patient  Judith Part, MD

## 2018-07-19 NOTE — Progress Notes (Signed)
At approximately 4:45pm this writer was in breakrrom heard Code Blue alert and quick responded upon arrival observed patient on the floor.

## 2018-07-19 NOTE — Social Work (Signed)
Social worker aware pt not discharging, have cancelled transport.   Will continue to follow.   Alexander Mt, [place] Work [phone]

## 2018-07-19 NOTE — Progress Notes (Addendum)
Inpatient Diabetes Program Recommendations  AACE/ADA: New Consensus Statement on Inpatient Glycemic Control (2015)  Target Ranges:  Prepandial:   less than 140 mg/dL      Peak postprandial:   less than 180 mg/dL (1-2 hours)      Critically ill patients:  140 - 180 mg/dL   Lab Results  Component Value Date   GLUCAP 288 (H) 07/19/2018   HGBA1C 9.5 (H) 06/19/2018    Review of Glycemic Control Results for Edward [name], Edward [name] (MRN [id number]) as of 07/19/2018 14:13  Ref. Range 07/18/2018 17:49 07/18/2018 21:47 07/19/2018 05:49 07/19/2018 07:39  Glucose-Capillary Latest Ref Range: 70 - 99 mg/dL 272 (H) 238 (H) 249 (H) 288 (H)   Diabetes history: Type 2 DM Outpatient Diabetes medications: NPH 30 units BID, Novolog 40 units TID Current orders for Inpatient glycemic control: Levemir 20 units BID, Novolog 10 units TID, Novolog  0-20 units TID, Novolog 0-5 units QHS  Inpatient Diabetes Program Recommendations:    Consider increasing Levemir to 30 units BID as BS trends have increased since adjustment yesterday.   Thanks, Bronson Curb, MSN, RNC-OB Diabetes Coordinator [phone] (8a-5p)

## 2018-07-19 NOTE — Progress Notes (Signed)
Notified Dr. Jimmy Footman via Warren Lacy of pt's ABG to see if any vent changes were needed. No new orders at this time. Will continue to monitor.

## 2018-07-19 NOTE — Progress Notes (Signed)
Report has been called and was given to [place] at [facility].Marland Kitchen

## 2018-07-19 NOTE — Discharge Summary (Signed)
Physician Discharge Summary [facility]    Patient: Edward [name]                   Admit date: 07/13/2018   DOB: [birth year]             Discharge date:07/19/2018/9:10 AM NWG:[id number]                         PCP: Ann Held, DO  Recommendations for Outpatient Follow-up:   . Follow up: Neurosurgery  Dr. Joyice Faster Ostergard  -ambulate tid as tolerated, Pt/OT  -discussed post-operative dysphagia with patient, will continue to improve but without steroids he will have a few more days of dysphagia and odynophagia -wound care and activity instructions placed in the patient's discharge instructions in Epic   Discharge Condition: Stable   Code Status:   Code Status: Prior Full code   Diet recommendation: Diabetic diet    Discharge Diagnoses:    Principal Problem:   Cervical radiculopathy Active Problems:   Essential hypertension   Essential tremor   Obstructive sleep apnea   GERD (gastroesophageal reflux disease)   Diabetes mellitus ([place])   Chronic kidney disease, stage III (moderate) ([place])   Coronary artery disease involving coronary bypass graft of native heart with angina pectoris [facility])   HPI / Hospital Course Kathleen Argue Summary:    Mr. Edward [name] is a 77 y.o.malewith prior history of CAD status post CABG, chronic systolic heart failure, insulin-dependent diabetes who was just discharged from the hospital on 9/12 after he was evaluated for right upper extremity weakness/numbness and right radicular pain-he was found to have severe cervical stenosis resulting in cord compression-since he was on antiplatelet agents-which were held-plans were for outpatient cervical spine decompression, however patient presented to the hospital on9/13 with constant neck pain, and associated right upper extremity numbness, patient was reevaluated by neurosurgery and underwent cervical spine decompression/discectomy on 9/16.   See below for further details.    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  Insulin-dependent diabetes: CBGs fluctuating-no longer on steroids -Hypoglycemic state, lab review will be reduced from 40 to 20 units twice daily, will also reduce NovoLog from 30 to 10 units with meals, with SSI   Cough and sputum production  -PRN Tussionex  -PRN suction  chronic diastolic heart failure:  -Stable Volume status stable. Continue Lasix-follow electrolytes, weights and intake output.  CAD: Stable, no anginal symptoms: Resume antiplatelets when okay with neurosurgery.  Hypertension: Stable, titrating the meds of propranolol, hydralazine and amlodipine, for optimal blood pressure control  Dyslipidemia: Continue statin  GERD:Continue PPI  CKD stage II: Creatinine close to usual baseline, monitor   VHQ:IONG nightly  Code Status: Full code  Disposition:To SNF - [place]   Consultations: Neurosurgery Dr. Joyice Faster Ostergard   Procedures: cervical spine decompression/discectomy on 9/16.      Discharge Instructions:   Discharge Instructions    Activity as tolerated - No restrictions   Complete by:  As directed    Diet Carb Modified   Complete by:  As directed    Discharge instructions   Complete by:  As directed    F/up with Neurosurgery as instructed   Increase activity  slowly   Complete by:  As directed        Medication List    STOP taking these medications   cyclobenzaprine 5 MG tablet Commonly known as:  FLEXERIL   dicyclomine 10 MG capsule Commonly known as:  BENTYL   meclizine 25 MG tablet Commonly known as:  ANTIVERT   NOVOLIN N RELION 100 UNIT/ML  injection Generic drug:  insulin NPH Human   primidone 50 MG tablet Commonly known as:  MYSOLINE     TAKE these medications   albuterol 108 (90 Base) MCG/ACT inhaler Commonly known as:  PROVENTIL HFA;VENTOLIN HFA inhale 2 puffs by mouth every 6 hours if needed for wheezing or shortness of breath   amLODipine 10 MG tablet Commonly known as:  NORVASC Take 1 tablet (10 mg total) by mouth daily.   aspirin EC 81 MG tablet Take 1 tablet (81 mg total) by mouth daily. Hold aspirin for 5 days for the surgery, restart it as per neuro surgery.   bismuth subsalicylate 528 UX/32GM suspension Commonly known as:  PEPTO BISMOL Take 30 mLs by mouth as needed for indigestion.   chlorpheniramine-HYDROcodone 10-8 MG/5ML Suer Commonly known as:  TUSSIONEX Take 5 mLs by mouth every 6 (six) hours.   clonazePAM 1 MG tablet Commonly known as:  KLONOPIN Take 1 tablet (1 mg total) by mouth at bedtime. What changed:    when to take this  reasons to take this   clopidogrel 75 MG tablet Commonly known as:  PLAVIX Take 1 tablet (75 mg total) by mouth daily. Hold plavix for the surgery, restart it as per neuro surgery.   diclofenac sodium 1 % Gel Commonly known as:  VOLTAREN Apply 2 g topically 4 (four) times daily as needed (for pain).   erythromycin ophthalmic ointment Place 1 application into both eyes See admin instructions. Apply to both eyelids once a day as needed for itching   esomeprazole 40 MG capsule Commonly known as:  NEXIUM Take 40 mg by mouth daily as needed (for reflux).   furosemide 40 MG tablet Commonly known as:  LASIX Take 1.5 tablets (60 mg total) by mouth daily.   gabapentin 300 MG capsule Commonly known as:  NEURONTIN take 1 capsule by mouth at bedtime for 1 week then INCREASE TO 1 CAP TWICE DAILY What changed:    how much to take  how to take this  when to take this  additional instructions   hydrALAZINE 25 MG tablet Commonly known as:  APRESOLINE Take 1  tablet (25 mg total) by mouth 3 (three) times daily.   insulin aspart 100 UNIT/ML injection Commonly known as:  novoLOG Inject 10 Units into the skin 3 (three) times daily before meals. What changed:  how much to take   insulin aspart 100 UNIT/ML injection Commonly known as:  novoLOG Inject 0-5 Units into the skin at bedtime. What changed:  You were already taking a medication with the same name, and this prescription was added. Make sure you understand how and when to take each.   insulin aspart 100 UNIT/ML injection Commonly known as:  novoLOG Inject 0-20 Units into the skin 3 (three) times daily with meals. What changed:  You were already taking a medication with the same name, and this prescription was added. Make sure you understand how and when to take each.   insulin detemir 100 UNIT/ML injection Commonly known as:  LEVEMIR Inject 0.2 mLs (20 Units total) into the skin 2 (two) times daily.  Insulin Syringe-Needle U-100 31G X 5/16" 1 ML Misc Use 4 times per day to inject insulin.   nitroGLYCERIN 0.4 MG SL tablet Commonly known as:  NITROSTAT Place 1 tablet (0.4 mg total) under the tongue every 5 (five) minutes as needed for chest pain. Max: 3 doses per episode. What changed:    how much to take  when to take this  additional instructions   oxyCODONE-acetaminophen 5-325 MG tablet Commonly known as:  PERCOCET/ROXICET Take 1-2 tablets by mouth every 4 (four) hours as needed for moderate pain or severe pain.   polyethylene glycol packet Commonly known as:  MIRALAX / GLYCOLAX Take 17 g by mouth daily. What changed:    when to take this  reasons to take this   potassium chloride SA 20 MEQ tablet Commonly known as:  K-DUR,KLOR-CON TAKE 1 & 1/2 (ONE & ONE-HALF) TABLETS BY MOUTH ONCE DAILY What changed:  See the new instructions.   prednisoLONE acetate 1 % ophthalmic suspension Commonly known as:  PRED FORTE Place 2 drops into both eyes 2 (two) times daily as  needed for itching.   propranolol 40 MG tablet Commonly known as:  INDERAL Take 1 tablet (40 mg total) by mouth 3 (three) times daily.   REFRESH OP Place 1 drop into both eyes 3 (three) times daily as needed (for dry eyes). Reported on 12/25/2015   rosuvastatin 20 MG tablet Commonly known as:  CRESTOR Take 1 tablet (20 mg total) by mouth daily. What changed:  when to take this   senna-docusate 8.6-50 MG tablet Commonly known as:  Senokot-S Take 2 tablets by mouth 2 (two) times daily.       Contact information for follow-up providers    Schedule an appointment as soon as possible for a visit  with Carollee Herter, Alferd Apa, DO.   Specialty:  Family Medicine Contact information: [address] [place] [address] [phone]            Contact information for after-discharge care    Destination    [facility] .   Service:  Skilled Nursing Contact information: [place] [phone]                 Allergies  Allergen Reactions  . Pneumococcal Vaccines Anaphylaxis     Procedures /Studies:   Ct Angio Head W Or Wo Contrast  Result Date: 07/09/2018 CLINICAL DATA:  Initial evaluation for neck pain with extremity weakness. EXAM: CT ANGIOGRAPHY HEAD AND NECK TECHNIQUE: Multidetector CT imaging of the head and neck was performed using the standard protocol during bolus administration of intravenous contrast. Multiplanar CT image reconstructions and MIPs were obtained to evaluate the vascular anatomy. Carotid stenosis measurements (when applicable) are obtained utilizing NASCET criteria, using the distal internal carotid diameter as the denominator. CONTRAST:  <See Chart> ISOVUE-370 IOPAMIDOL (ISOVUE-370) INJECTION 76% COMPARISON:  Prior CT from 05/22/2017. FINDINGS: CT HEAD FINDINGS Brain: Age-related cerebral atrophy with mild chronic small vessel ischemic disease. No acute intracranial hemorrhage. No  acute large vessel territory infarct. No mass lesion, midline shift or mass effect. No hydrocephalus. No extra-axial fluid collection. Vascular: No hyperdense vessel. Skull: Scalp soft tissues and calvarium within normal limits. Sinuses: Paranasal sinuses are clear. Trace opacity right mastoid air cells noted, of doubtful significance. Orbits: Globes and orbital soft tissues within normal limits. Review of the MIP images confirms the above findings CTA NECK FINDINGS Aortic arch: Visualized aortic arch of normal caliber  with normal 3 vessel morphology. Sequelae of prior CABG partially visualized. Mild atheromatous plaque within the arch and about the origin of the great vessels without hemodynamically significant stenosis. Visualized subclavian arteries widely patent. Right carotid system: Right common carotid artery patent from its origin to the bifurcation without flow-limiting stenosis. Mild atheromatous irregularity about the right bifurcation with associated stenosis of up to 40% by NASCET criteria. Right ICA widely patent distally to the skull base without stenosis, dissection, or occlusion. Left carotid system: Soft atheromatous plaque within the right common carotid artery with secondary mild diffuse stenosis. Minimal calcified plaque about the left bifurcation without significant narrowing. Left ICA widely patent from the bifurcation to the skull base without stenosis, dissection, or occlusion. Vertebral arteries: Both of the vertebral arteries arise from the subclavian arteries. Focal plaque at the origin of the vertebral arteries bilaterally with associated moderate approximate 50% stenosis on the left and fairly severe stenosis on the right. Left vertebral artery dominant. Vertebral arteries otherwise widely patent within the neck without stenosis, dissection, or occlusion. Skeleton: No acute osseous abnormality. No discrete lytic or blastic osseous lesions. Other neck: No acute soft tissue abnormality  within the neck. Few scattered subcentimeter hypodense thyroid nodules noted, of doubtful significance given size and patient age. Upper chest: Visualized upper chest demonstrates no acute finding. Partially visualized lungs are grossly clear. Review of the MIP images confirms the above findings CTA HEAD FINDINGS Anterior circulation: Petrous segments widely patent bilaterally. Mild atheromatous plaque within the cavernous/supraclinoid ICAs without hemodynamically significant stenosis. ICA termini widely patent. A1 segments, anterior communicating artery common anterior cerebral arteries widely patent bilaterally. M1 segments widely patent without stenosis. No proximal M2 occlusion. Distal MCA branches well perfused and symmetric. Distal small vessel atheromatous irregularity noted. Posterior circulation: Mild atheromatous irregularity within the dominant left vertebral artery without hemodynamically significant stenosis. Focal moderate distal right V4 segment just beyond the takeoff of the right PICA. Posterior inferior cerebral arteries patent bilaterally. Basilar artery diminutive but widely patent to its distal aspect. Superior cerebral arteries patent bilaterally. Hypoplastic P1 segments with predominant fetal type origin of the PCAs. Moderate to advanced multifocal atheromatous irregularity throughout the PCAs bilaterally with associated moderate to severe multifocal stenoses. Venous sinuses: Patent. Anatomic variants: Fetal type origin of the PCAs.  No aneurysm. Delayed phase: No abnormal enhancement. Review of the MIP images confirms the above findings IMPRESSION: 1. Negative CTA for large vessel occlusion. No other acute vascular abnormality within the head and neck. 2. Moderate intracranial atherosclerotic change, most notable within the bilateral PCAs and distal MCA branches. No other proximal high-grade or correctable stenosis identified. 3. Atheromatous stenoses at the origin of the vertebral arteries  bilaterally, moderate on the left and severe on the right. Left vertebral artery dominant. Predominant fetal type origin of the PCAs with overall diminutive vertebrobasilar system. 4. Atheromatous plaque about the right carotid bifurcation with associated stenosis of up to approximately 40% by NASCET criteria. Electronically Signed   By: Jeannine Boga M.D.   On: 07/09/2018 21:44   Dg Cervical Spine 1 View  Result Date: 07/16/2018 CLINICAL DATA:  C3-7 ACDF. EXAM: DG CERVICAL SPINE - 1 VIEW; DG C-ARM 61-120 MIN COMPARISON:  Cervical MRI 07/09/2018. FINDINGS: A single lateral spot fluoroscopic image of the cervical spine is submitted. Small Field-of-view does not allow accurate numbering of the cervical segments. There is an anterior plate and screws, the inferior components of which are not well visualized. No complications are identified. IMPRESSION: Lateral view following cervical  fusion. The inferior aspect of the hardware is incompletely visualized, and small field-of-view does not allow accurate numbering of the cervical segments. Electronically Signed   By: Richardean Sale M.D.   On: 07/16/2018 17:30   Ct Angio Neck W And/or Wo Contrast  Result Date: 07/09/2018 CLINICAL DATA:  Initial evaluation for neck pain with extremity weakness. EXAM: CT ANGIOGRAPHY HEAD AND NECK TECHNIQUE: Multidetector CT imaging of the head and neck was performed using the standard protocol during bolus administration of intravenous contrast. Multiplanar CT image reconstructions and MIPs were obtained to evaluate the vascular anatomy. Carotid stenosis measurements (when applicable) are obtained utilizing NASCET criteria, using the distal internal carotid diameter as the denominator. CONTRAST:  <See Chart> ISOVUE-370 IOPAMIDOL (ISOVUE-370) INJECTION 76% COMPARISON:  Prior CT from 05/22/2017. FINDINGS: CT HEAD FINDINGS Brain: Age-related cerebral atrophy with mild chronic small vessel ischemic disease. No acute intracranial  hemorrhage. No acute large vessel territory infarct. No mass lesion, midline shift or mass effect. No hydrocephalus. No extra-axial fluid collection. Vascular: No hyperdense vessel. Skull: Scalp soft tissues and calvarium within normal limits. Sinuses: Paranasal sinuses are clear. Trace opacity right mastoid air cells noted, of doubtful significance. Orbits: Globes and orbital soft tissues within normal limits. Review of the MIP images confirms the above findings CTA NECK FINDINGS Aortic arch: Visualized aortic arch of normal caliber with normal 3 vessel morphology. Sequelae of prior CABG partially visualized. Mild atheromatous plaque within the arch and about the origin of the great vessels without hemodynamically significant stenosis. Visualized subclavian arteries widely patent. Right carotid system: Right common carotid artery patent from its origin to the bifurcation without flow-limiting stenosis. Mild atheromatous irregularity about the right bifurcation with associated stenosis of up to 40% by NASCET criteria. Right ICA widely patent distally to the skull base without stenosis, dissection, or occlusion. Left carotid system: Soft atheromatous plaque within the right common carotid artery with secondary mild diffuse stenosis. Minimal calcified plaque about the left bifurcation without significant narrowing. Left ICA widely patent from the bifurcation to the skull base without stenosis, dissection, or occlusion. Vertebral arteries: Both of the vertebral arteries arise from the subclavian arteries. Focal plaque at the origin of the vertebral arteries bilaterally with associated moderate approximate 50% stenosis on the left and fairly severe stenosis on the right. Left vertebral artery dominant. Vertebral arteries otherwise widely patent within the neck without stenosis, dissection, or occlusion. Skeleton: No acute osseous abnormality. No discrete lytic or blastic osseous lesions. Other neck: No acute soft tissue  abnormality within the neck. Few scattered subcentimeter hypodense thyroid nodules noted, of doubtful significance given size and patient age. Upper chest: Visualized upper chest demonstrates no acute finding. Partially visualized lungs are grossly clear. Review of the MIP images confirms the above findings CTA HEAD FINDINGS Anterior circulation: Petrous segments widely patent bilaterally. Mild atheromatous plaque within the cavernous/supraclinoid ICAs without hemodynamically significant stenosis. ICA termini widely patent. A1 segments, anterior communicating artery common anterior cerebral arteries widely patent bilaterally. M1 segments widely patent without stenosis. No proximal M2 occlusion. Distal MCA branches well perfused and symmetric. Distal small vessel atheromatous irregularity noted. Posterior circulation: Mild atheromatous irregularity within the dominant left vertebral artery without hemodynamically significant stenosis. Focal moderate distal right V4 segment just beyond the takeoff of the right PICA. Posterior inferior cerebral arteries patent bilaterally. Basilar artery diminutive but widely patent to its distal aspect. Superior cerebral arteries patent bilaterally. Hypoplastic P1 segments with predominant fetal type origin of the PCAs. Moderate to advanced multifocal atheromatous irregularity  throughout the PCAs bilaterally with associated moderate to severe multifocal stenoses. Venous sinuses: Patent. Anatomic variants: Fetal type origin of the PCAs.  No aneurysm. Delayed phase: No abnormal enhancement. Review of the MIP images confirms the above findings IMPRESSION: 1. Negative CTA for large vessel occlusion. No other acute vascular abnormality within the head and neck. 2. Moderate intracranial atherosclerotic change, most notable within the bilateral PCAs and distal MCA branches. No other proximal high-grade or correctable stenosis identified. 3. Atheromatous stenoses at the origin of the  vertebral arteries bilaterally, moderate on the left and severe on the right. Left vertebral artery dominant. Predominant fetal type origin of the PCAs with overall diminutive vertebrobasilar system. 4. Atheromatous plaque about the right carotid bifurcation with associated stenosis of up to approximately 40% by NASCET criteria. Electronically Signed   By: Jeannine Boga M.D.   On: 07/09/2018 21:44   Mr Brain Wo Contrast  Result Date: 07/13/2018 CLINICAL DATA:  Initial evaluation for acute dizziness. EXAM: MRI HEAD WITHOUT CONTRAST TECHNIQUE: Multiplanar, multiecho pulse sequences of the brain and surrounding structures were obtained without intravenous contrast. COMPARISON:  Prior MRI from 07/09/2018. FINDINGS: Brain: Mild atrophy with chronic small vessel ischemic disease. Small remote lacunar infarct present within the right thalamus. No acute intracranial infarct. No evidence for acute or chronic intracranial hemorrhage. No mass lesion, midline shift or mass effect. No hydrocephalus. No extra-axial fluid collection. Pituitary gland within normal limits. Vascular: Major intracranial vascular flow voids maintained. Skull and upper cervical spine: Craniocervical junction within normal limits. Upper cervical spine normal. No focal marrow replacing lesion. Scalp soft tissues demonstrate no acute finding. Sinuses/Orbits: Sequelae of prior ocular lens replacement bilaterally. Paranasal sinuses clear. Small bilateral mastoid effusions noted, of doubtful significance. Other: None. IMPRESSION: 1. No acute intracranial abnormality. 2. Mild atrophy with chronic small vessel ischemic disease, with small remote right thalamic lacunar infarct, stable. Electronically Signed   By: Jeannine Boga M.D.   On: 07/13/2018 20:08   Mr Brain W And Wo Contrast  Result Date: 07/10/2018 CLINICAL DATA:  77 y/o M; chief complaint of neck pain with bilateral upper and lower extremity weakness. EXAM: MRI HEAD WITHOUT AND  WITH CONTRAST MRI CERVICAL SPINE WITHOUT AND WITH CONTRAST TECHNIQUE: Multiplanar, multiecho pulse sequences of the brain and surrounding structures, and cervical spine, to include the craniocervical junction and cervicothoracic junction, were obtained without and with intravenous contrast. CONTRAST:  10 cc Gadavist. COMPARISON:  07/09/2018 CTA head and neck. FINDINGS: MRI HEAD FINDINGS Brain: No acute infarction, hemorrhage, hydrocephalus, extra-axial collection or mass lesion. Few nonspecific T2 FLAIR hyperintensities in subcortical and periventricular white matter are compatible with mild chronic microvascular ischemic changes for age. Mild volume loss of the brain. No abnormal enhancement. Vascular: Normal flow voids. Skull and upper cervical spine: Normal marrow signal. Sinuses/Orbits: Mild maxillary sinus mucosal thickening and partial opacification of mastoid air cells. Bilateral intra-ocular lens replacement. Other: None. MRI CERVICAL SPINE FINDINGS Alignment: Physiologic. Vertebrae: Mild right-sided C5-6 facet edema and small facet effusions at the left-sided C3-4 and C4-5 levels, likely degenerative. No findings of fracture or discitis. Cord: No abnormal cord signal.  No abnormal enhancement. Posterior Fossa, vertebral arteries, paraspinal tissues: Negative. Disc levels: C2-3: Right greater than left uncovertebral and facet hypertrophy. Mild right foraminal stenosis. No canal stenosis. C3-4: Disc osteophyte complex with bilateral uncovertebral and facet hypertrophy. Moderate bilateral foraminal and moderate canal stenosis. C4-5: Disc osteophyte complex eccentric with bilateral uncovertebral and facet hypertrophy. Severe bilateral foraminal stenosis. Moderate canal stenosis and right  anterior cord impingement cord flattening. C5-6: Disc osteophyte complex with bilateral uncovertebral and facet hypertrophy. Severe bilateral foraminal stenosis and moderate canal stenosis. C6-7: Disc osteophyte complex with  left central disc protrusion. Moderate canal stenosis with left central anterior cord impingement. No significant foraminal stenosis. C7-T1: Small disc bulge and facet hypertrophy. Mild canal stenosis. No significant foraminal stenosis. IMPRESSION: MRI head: 1. No acute intracranial process or abnormal enhancement. 2. Mild chronic microvascular ischemic changes and volume loss of the brain. MRI cervical spine: 1. Mild right C5-6 as well as left C3-4 and C4-5 facet effusions, likely degenerative. 2. Advanced cervical spondylosis with disc and facet degenerative changes greatest at the C3-4 through C6-7 levels. 3. Moderate C3-4 through C6-7 canal stenosis. Right anterior C4-5 and left anterior C6-7 cord impingement. No abnormal cord signal. 4. Severe C4-5 and C5-6 bilateral foraminal stenosis. Multilevel mild and moderate foraminal stenosis. Electronically Signed   By: Kristine Garbe M.D.   On: 07/10/2018 00:56   Mr Cervical Spine W Or Wo Contrast  Result Date: 07/10/2018 CLINICAL DATA:  77 y/o M; chief complaint of neck pain with bilateral upper and lower extremity weakness. EXAM: MRI HEAD WITHOUT AND WITH CONTRAST MRI CERVICAL SPINE WITHOUT AND WITH CONTRAST TECHNIQUE: Multiplanar, multiecho pulse sequences of the brain and surrounding structures, and cervical spine, to include the craniocervical junction and cervicothoracic junction, were obtained without and with intravenous contrast. CONTRAST:  10 cc Gadavist. COMPARISON:  07/09/2018 CTA head and neck. FINDINGS: MRI HEAD FINDINGS Brain: No acute infarction, hemorrhage, hydrocephalus, extra-axial collection or mass lesion. Few nonspecific T2 FLAIR hyperintensities in subcortical and periventricular white matter are compatible with mild chronic microvascular ischemic changes for age. Mild volume loss of the brain. No abnormal enhancement. Vascular: Normal flow voids. Skull and upper cervical spine: Normal marrow signal. Sinuses/Orbits: Mild maxillary  sinus mucosal thickening and partial opacification of mastoid air cells. Bilateral intra-ocular lens replacement. Other: None. MRI CERVICAL SPINE FINDINGS Alignment: Physiologic. Vertebrae: Mild right-sided C5-6 facet edema and small facet effusions at the left-sided C3-4 and C4-5 levels, likely degenerative. No findings of fracture or discitis. Cord: No abnormal cord signal.  No abnormal enhancement. Posterior Fossa, vertebral arteries, paraspinal tissues: Negative. Disc levels: C2-3: Right greater than left uncovertebral and facet hypertrophy. Mild right foraminal stenosis. No canal stenosis. C3-4: Disc osteophyte complex with bilateral uncovertebral and facet hypertrophy. Moderate bilateral foraminal and moderate canal stenosis. C4-5: Disc osteophyte complex eccentric with bilateral uncovertebral and facet hypertrophy. Severe bilateral foraminal stenosis. Moderate canal stenosis and right anterior cord impingement cord flattening. C5-6: Disc osteophyte complex with bilateral uncovertebral and facet hypertrophy. Severe bilateral foraminal stenosis and moderate canal stenosis. C6-7: Disc osteophyte complex with left central disc protrusion. Moderate canal stenosis with left central anterior cord impingement. No significant foraminal stenosis. C7-T1: Small disc bulge and facet hypertrophy. Mild canal stenosis. No significant foraminal stenosis. IMPRESSION: MRI head: 1. No acute intracranial process or abnormal enhancement. 2. Mild chronic microvascular ischemic changes and volume loss of the brain. MRI cervical spine: 1. Mild right C5-6 as well as left C3-4 and C4-5 facet effusions, likely degenerative. 2. Advanced cervical spondylosis with disc and facet degenerative changes greatest at the C3-4 through C6-7 levels. 3. Moderate C3-4 through C6-7 canal stenosis. Right anterior C4-5 and left anterior C6-7 cord impingement. No abnormal cord signal. 4. Severe C4-5 and C5-6 bilateral foraminal stenosis. Multilevel mild  and moderate foraminal stenosis. Electronically Signed   By: Kristine Garbe M.D.   On: 07/10/2018 00:56   Dg  C-arm 1-60 Min  Result Date: 07/16/2018 CLINICAL DATA:  C3-7 ACDF. EXAM: DG CERVICAL SPINE - 1 VIEW; DG C-ARM 61-120 MIN COMPARISON:  Cervical MRI 07/09/2018. FINDINGS: A single lateral spot fluoroscopic image of the cervical spine is submitted. Small Field-of-view does not allow accurate numbering of the cervical segments. There is an anterior plate and screws, the inferior components of which are not well visualized. No complications are identified. IMPRESSION: Lateral view following cervical fusion. The inferior aspect of the hardware is incompletely visualized, and small field-of-view does not allow accurate numbering of the cervical segments. Electronically Signed   By: Richardean Sale M.D.   On: 07/16/2018 17:30   Dg C-arm 1-60 Min  Result Date: 07/16/2018 CLINICAL DATA:  C3-7 ACDF. EXAM: DG CERVICAL SPINE - 1 VIEW; DG C-ARM 61-120 MIN COMPARISON:  Cervical MRI 07/09/2018. FINDINGS: A single lateral spot fluoroscopic image of the cervical spine is submitted. Small Field-of-view does not allow accurate numbering of the cervical segments. There is an anterior plate and screws, the inferior components of which are not well visualized. No complications are identified. IMPRESSION: Lateral view following cervical fusion. The inferior aspect of the hardware is incompletely visualized, and small field-of-view does not allow accurate numbering of the cervical segments. Electronically Signed   By: Richardean Sale M.D.   On: 07/16/2018 17:30   Dg C-arm 1-60 Min  Result Date: 07/16/2018 CLINICAL DATA:  C3-7 ACDF. EXAM: DG CERVICAL SPINE - 1 VIEW; DG C-ARM 61-120 MIN COMPARISON:  Cervical MRI 07/09/2018. FINDINGS: A single lateral spot fluoroscopic image of the cervical spine is submitted. Small Field-of-view does not allow accurate numbering of the cervical segments. There is an anterior plate  and screws, the inferior components of which are not well visualized. No complications are identified. IMPRESSION: Lateral view following cervical fusion. The inferior aspect of the hardware is incompletely visualized, and small field-of-view does not allow accurate numbering of the cervical segments. Electronically Signed   By: Richardean Sale M.D.   On: 07/16/2018 17:30     Subjective:   Patient was seen and examined 07/19/2018, 9:10 AM Patient stable today. No acute distress.  No issues overnight Stable for discharge.  Discharge Exam:    Vitals:   07/18/18 2246 07/19/18 0313 07/19/18 0625 07/19/18 0736  BP: (!) 135/54 (!) 150/59 (!) 159/55 (!) 161/51  Pulse: (!) 57 64  (!) 57  Resp: 17 19  18   Temp: 97.9 F (36.6 C) (!) 97.5 F (36.4 C)  97.7 F (36.5 C)  TempSrc: Oral Oral  Oral  SpO2:      Weight:      Height:        General: Pt lying comfortably in bed & appears in no obvious distress. Cardiovascular: S1 & S2 heard, RRR, S1/S2 +. No murmurs, rubs, gallops or clicks. No JVD or pedal edema. Respiratory: Clear to auscultation without wheezing, rhonchi or crackles. No increased work of breathing. Abdominal:  Non-distended, non-tender & soft. No organomegaly or masses appreciated. Normal bowel sounds heard. CNS: Alert and oriented. No focal deficits. Extremities: no edema, no cyanosis    The results of significant diagnostics from this hospitalization (including imaging, microbiology, ancillary and laboratory) are listed below for reference.      Microbiology:   Recent Results (from the past 240 hour(s))  MRSA PCR Screening     Status: None   Collection Time: 07/14/18  3:04 AM  Result Value Ref Range Status   MRSA by PCR NEGATIVE NEGATIVE Final  Comment:        The GeneXpert MRSA Assay (FDA approved for NASAL specimens only), is one component of a comprehensive MRSA colonization surveillance program. It is not intended to diagnose MRSA infection nor to guide  or monitor treatment for MRSA infections. Performed at [facility], [place] [address], [place], [place] [address]      Labs:   CBC: Recent Labs  Lab 07/13/18 1238 07/16/18 0242 07/16/18 1818  WBC 6.1 11.3* 10.8*  NEUTROABS 4.8  --   --   HGB 12.6* 12.9* 13.7  HCT 39.9 40.7 43.4  MCV 89.1 88.7 89.9  PLT 252 274 208   Basic Metabolic Panel: Recent Labs  Lab 07/13/18 1238 07/16/18 0242 07/18/18 0351  NA 135 140 137  K 4.5 4.1 4.1  CL 100 103 98  CO2 26 30 30   GLUCOSE 245* 111* 150*  BUN 18 22 11   CREATININE 1.25* 1.22 0.98  CALCIUM 8.5* 9.0 8.5*   Liver Function Tests: Recent Labs  Lab 07/13/18 1238  AST 20  ALT 15  ALKPHOS 88  BILITOT 0.7  PROT 5.9*  ALBUMIN 3.1*   BNP (last 3 results) Recent Labs    10/13/17 1027 03/08/18 1228  BNP 27.3 100.9*   Cardiac Enzymes: No results for input(s): CKTOTAL, CKMB, CKMBINDEX, TROPONINI in the last 168 hours. CBG: Recent Labs  Lab 07/18/18 0733 07/18/18 1127 07/18/18 1749 07/18/18 2147 07/19/18 0549  GLUCAP 168* 198* 272* 238* 249*   Anemia work up No results for input(s): VITAMINB12, FOLATE, FERRITIN, TIBC, IRON, RETICCTPCT in the last 72 hours. Urinalysis    Component Value Date/Time   COLORURINE YELLOW 07/13/2018 [place] 07/13/2018 1234   LABSPEC 1.014 07/13/2018 1234   PHURINE 5.0 07/13/2018 1234   GLUCOSEU NEGATIVE 07/13/2018 1234   HGBUR NEGATIVE 07/13/2018 [place] 07/13/2018 1234   BILIRUBINUR neg 01/02/2017 0959   KETONESUR NEGATIVE 07/13/2018 1234   PROTEINUR 30 (A) 07/13/2018 1234   UROBILINOGEN negative 01/02/2017 0959   NITRITE NEGATIVE 07/13/2018 1234   LEUKOCYTESUR NEGATIVE 07/13/2018 1234    Time coordinating discharge: Over 30 minutes  SIGNED: Deatra James, MD, FACP, FHM. [facility],  Pager [phone]  If 7PM-7AM, please contact night-coverage [email or web address], Password [facility] 07/19/2018, 9:10 AM

## 2018-07-19 NOTE — Social Work (Addendum)
Clinical Social Worker facilitated patient discharge including contacting patient family and facility to confirm patient discharge plans.  Clinical information faxed to facility and family agreeable with plan.  CSW arranged ambulance transport via [facility] to [place]. Transport scheduled at 11:14am for 12:00pm.   Updated pick up time to 1:30pm as pt room not ready yet.  RN to call [phone] with report  prior to discharge.  Clinical Social Worker will sign off for now as social work intervention is no longer needed. Please consult [place] again if new need arises.  Alexander Mt, [place] Social Worker [phone]

## 2018-07-19 NOTE — Progress Notes (Signed)
RT NOTE: RT transported patient from room 4NP02 to room 4N15 on ventilator. Vitals are stable. RT will continue to monitor.

## 2018-07-19 NOTE — Procedures (Signed)
Central Venous Catheter Insertion Procedure Note DAMARKUS BALIS [id number] 11-09-1940  Procedure: Insertion of Central Venous Catheter Indications: Assessment of intravascular volume, Drug and/or fluid administration and Frequent blood sampling  Procedure Details Consent: Unable to obtain consent because of altered level of consciousness. Time Out: Verified patient identification, verified procedure, site/side was marked, verified correct patient position, special equipment/implants available, medications/allergies/relevent history reviewed, required imaging and test results available.  Performed  Maximum sterile technique was used including antiseptics, cap, gloves, gown, hand hygiene, mask and sheet. Skin prep: Chlorhexidine; local anesthetic administered A antimicrobial bonded/coated triple lumen catheter was placed in the left internal jugular vein using the Seldinger technique.  Evaluation Blood flow good Complications: No apparent complications Patient did tolerate procedure well. Chest X-ray ordered to verify placement.  CXR: pending.  Procedure performed under direct ultrasound guidance for real time vessel cannulation.      Montey Hora, [facility] Pager: [phone]  or [phone] 07/19/2018, 7:46 PM

## 2018-07-19 NOTE — Consult Note (Signed)
PULMONARY / CRITICAL CARE MEDICINE   NAME:  Edward [name], MRN:  [id number], DOB:  [birth year], LOS: 6   ADMISSION DATE:  07/13/2018, CONSULTATION DATE: 07/19/2018    REFERRING MD:  Roger Shelter  CHIEF COMPLAINT: Cardiac arrest BRIEF HISTORY:    77 year old with multiple comorbidities including severe cervical stenosis resulting in cord compression status post 4 level ACDF complains of difficulty swallowing and shortly after he had a cardiac arrest on the day of discharge.   HISTORY OF PRESENT ILLNESS   77 year old male with history of CAD status post CABG chronic systolic congestive heart failure insulin-dependent diabetes mellitus who was admitted with right upper extremity weakness and numbness associated with severe cervical stenosis resulting in cord compression he went for for level ACDF on July 16, 2018 and has been managed without steroid due to his hyperglycemia he was planned for discharge today on 07/19/2018 and while waiting for his discharge patient was started to complain of difficulty swallowing not able to swallow his spit and Decadron was ordered but has not received that yet staff went to check on him and he was on the floor in asystole he had one round of epinephrine and had ROSC.  I was called by primary team to come and assess patient patient is intubated he is not responding postarrest not following any commands not moving any of his extremities bucking the ventilator.   SIGNIFICANT PAST MEDICAL HISTORY   -Coronary artery disease status post CABG -Diabetes mellitus -Hypertension -Congestive heart failure -Chronic kidney disease stage II -Obstructive sleep apnea  SIGNIFICANT EVENTS:  Cardiac arrest 07/19/2018  STUDIES:    CULTURES:    ANTIBIOTICS:    LINES/TUBES:   ETT 07/19/2018>>   CONSULTANTS:  Neurosurgery  SUBJECTIVE:    CONSTITUTIONAL: BP (!) 172/91   Pulse (!) 57   Temp (!) 97.2 F (36.2 C) (Oral)   Resp 18   Ht 5\' 8"  (1.727 m)   Wt 127  kg   SpO2 98%   BMI 42.57 kg/m   I/O last 3 completed shifts: In: 60 [P.O.:240; Other:40] Out: 1150 [Urine:1150]     Vent Mode: PRVC FiO2 (%):  [100 %] 100 % Set Rate:  [14 bmp] 14 bmp Vt Set:  [550 mL] 550 mL PEEP:  [5 cmH20] 5 cmH20  PHYSICAL EXAM: General: Obtunded on mechanical ventilation with frothy secretions coming out of his mouth  neuro: Obtunded not responding to pain or verbal stimuli HEENT: Intubated Cardiovascular: Normal sinus sounds or murmurs Lungs: Clear clear some bilateral crackles no wheezing Abdomen: Soft no tenderness no organomegaly Musculoskeletal: No lower limb edema Skin: No skin rash  RESOLVED PROBLEM LIST   ASSESSMENT AND PLAN   Mr. 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 -Spinal cord stenosis status post 4 level ACDF -Coronary artery disease status post CABG -Diabetes mellitus -Hypertension -Chronic kidney disease  Plan: -Patient is not waking up post cardiac arrest we will do hypothermia protocol -Stat CT scan of the head and neck -Decadron scheduled -Insulin sliding scale -Trend cardiac enzymes -Central line insertion for IV access -Hold off on DVT prophylaxis for now -Sedated  with propofol and fentanyl  I have spent 45 minutes of critical care time this time was spent bedside or in the unit this time was exclusive of any billable procedures patient is needing intensive care due to acute respiratory failure requiring mechanical  ventilation  SUMMARY OF TODAY'S PLAN:    Best Practice / Goals of Care / Disposition.   DVT PROPHYLAXIS: Hold off until brain imaging SUP: PPI NUTRITION: N.p.o. for now MOBILITY: Bedrest GOALS OF CARE: FAMILY DISCUSSIONS: No family around DISPOSITION ICU  LABS  Glucose Recent Labs  Lab 07/18/18 0733 07/18/18 1127 07/18/18 1749 07/18/18 2147 07/19/18 0549 07/19/18 0739  GLUCAP 168* 198* 272* 238* 249* 288*    BMET Recent Labs  Lab 07/13/18 1238 07/16/18 0242 07/18/18 0351  NA 135 140 137  K 4.5 4.1 4.1  CL 100 103 98  CO2 26 30 30   BUN 18 22 11   CREATININE 1.25* 1.22 0.98  GLUCOSE 245* 111* 150*    Liver Enzymes Recent Labs  Lab 07/13/18 1238  AST 20  ALT 15  ALKPHOS 88  BILITOT 0.7  ALBUMIN 3.1*    Electrolytes Recent Labs  Lab 07/13/18 1238 07/16/18 0242 07/18/18 0351  CALCIUM 8.5* 9.0 8.5*    CBC Recent Labs  Lab 07/13/18 1238 07/16/18 0242 07/16/18 1818  WBC 6.1 11.3* 10.8*  HGB 12.6* 12.9* 13.7  HCT 39.9 40.7 43.4  PLT 252 274 244    ABG No results for input(s): PHART, PCO2ART, PO2ART in the last 168 hours.  Coag's No results for input(s): APTT, INR in the last 168 hours.  Sepsis Markers No results for input(s): LATICACIDVEN, PROCALCITON, O2SATVEN in the last 168 hours.  Cardiac Enzymes No results for input(s): TROPONINI, PROBNP in the last 168 hours.  PAST MEDICAL HISTORY :   He  has a past medical history of Adenomatous colon polyp (04/2011), CAD (coronary artery disease), Chronic diastolic CHF (congestive heart failure) ([place]), CKD (chronic kidney disease), stage II, DM type 2 (diabetes mellitus, type 2), insulin dependent (01/29/2014), Dyslipidemia, goal LDL below 70 (01/29/2014), Enlarged prostate, Essential tremor, GERD (gastroesophageal reflux disease), CABG, Hypertension, Internal hemorrhoid, Sleep apnea, and TIA (transient ischemic attack) (2002).  PAST SURGICAL HISTORY:  He  has a past surgical history that includes  Total knee arthroplasty (Bilateral); Lumbar disc surgery; Cataract extraction (Bilateral); Joint replacement; Back surgery; Cataract extraction, bilateral (Bilateral); Shoulder arthroscopy (Right, 07/30/2014); left heart catheterization with coronary angiogram (N/A, 01/28/2014); left heart catheterization with coronary angiogram (N/A, 01/30/2015); Cardiac catheterization (01/28/14); Coronary angioplasty with stent (01/30/2015); Cardiac catheterization (N/A, 12/21/2015); Cardiac catheterization (N/A, 11/02/2016); Coronary artery bypass graft (N/A, 11/09/2016); TEE without cardioversion (N/A, 11/09/2016); Endoharvest vein of greater saphenous vein (Left, 11/09/2016); LEFT HEART CATH AND CORS/GRAFTS ANGIOGRAPHY (N/A, 03/09/2018); and Anterior cervical decompression/discectomy fusion 4 level (N/A, 07/16/2018).  Allergies  Allergen Reactions  . Pneumococcal Vaccines Anaphylaxis    No current facility-administered medications on file prior to encounter.    Current Outpatient Medications on File Prior to Encounter  Medication Sig  . amLODipine (NORVASC) 10 MG tablet Take 1 tablet (10 mg total) by mouth daily.  Marland Kitchen bismuth subsalicylate (PEPTO BISMOL) 262 MG/15ML suspension Take 30 mLs by mouth as needed for indigestion.  . clonazePAM (KLONOPIN) 1 MG tablet Take 1 tablet (1 mg total) by mouth at bedtime. (Patient taking differently: Take 1 mg by mouth at bedtime as needed (for sleep or anxiety). )  . cyclobenzaprine (FLEXERIL) 5 MG tablet Take 1 tablet (5 mg total) by mouth at bedtime  as needed for muscle spasms (headache). (Patient taking differently: Take 5 mg by mouth at bedtime as needed (for muscle spasms, leg cramps, or headaches). )  . diclofenac sodium (VOLTAREN) 1 % GEL Apply 2 g topically 4 (four) times daily as needed (for pain).   Marland Kitchen dicyclomine (BENTYL) 10 MG capsule Take 10 mg by mouth 2 (two) times daily.   Marland Kitchen erythromycin ophthalmic ointment Place 1 application into both eyes See admin instructions. Apply to  both eyelids once a day as needed for itching  . esomeprazole (NEXIUM) 40 MG capsule Take 40 mg by mouth daily as needed (for reflux).   . furosemide (LASIX) 40 MG tablet Take 1.5 tablets (60 mg total) by mouth daily.  Marland Kitchen gabapentin (NEURONTIN) 300 MG capsule take 1 capsule by mouth at bedtime for 1 week then INCREASE TO 1 CAP TWICE DAILY (Patient taking differently: Take 300 mg by mouth 2 (two) times daily. )  . hydrALAZINE (APRESOLINE) 25 MG tablet Take 1 tablet (25 mg total) by mouth 3 (three) times daily.  . insulin aspart (NOVOLOG) 100 UNIT/ML injection Inject 40 Units into the skin 3 (three) times daily before meals.  . insulin NPH Human (NOVOLIN N RELION) 100 UNIT/ML injection Inject 30 Units into the skin 2 (two) times daily before a meal.   . meclizine (ANTIVERT) 25 MG tablet Take 1 tablet (25 mg total) by mouth 2 (two) times daily as needed for dizziness.  . nitroGLYCERIN (NITROSTAT) 0.4 MG SL tablet Place 1 tablet (0.4 mg total) under the tongue every 5 (five) minutes as needed for chest pain. Max: 3 doses per episode. (Patient taking differently: Place 0.4-0.8 mg under the tongue every 5 (five) minutes x 3 doses as needed for chest pain. )  . oxyCODONE-acetaminophen (PERCOCET/ROXICET) 5-325 MG tablet Take 1-2 tablets by mouth every 4 (four) hours as needed for moderate pain or severe pain.  . polyethylene glycol (MIRALAX / GLYCOLAX) packet Take 17 g by mouth daily. (Patient taking differently: Take 17 g by mouth daily as needed for mild constipation. )  . Polyvinyl Alcohol-Povidone (REFRESH OP) Place 1 drop into both eyes 3 (three) times daily as needed (for dry eyes). Reported on 12/25/2015  . potassium chloride SA (K-DUR,KLOR-CON) 20 MEQ tablet TAKE 1 & 1/2 (ONE & ONE-HALF) TABLETS BY MOUTH ONCE DAILY (Patient taking differently: Take 30 mEq by mouth daily. )  . prednisoLONE acetate (PRED FORTE) 1 % ophthalmic suspension Place 2 drops into both eyes 2 (two) times daily as needed for itching.   . primidone (MYSOLINE) 50 MG tablet Take 1 tablet (50 mg total) by mouth 2 (two) times daily.  . propranolol (INDERAL) 40 MG tablet Take 1 tablet (40 mg total) by mouth 3 (three) times daily.  . rosuvastatin (CRESTOR) 20 MG tablet Take 1 tablet (20 mg total) by mouth daily. (Patient taking differently: Take 20 mg by mouth 2 (two) times a week. )  . albuterol (VENTOLIN HFA) 108 (90 Base) MCG/ACT inhaler inhale 2 puffs by mouth every 6 hours if needed for wheezing or shortness of breath (Patient not taking: Reported on 07/13/2018)  . aspirin EC 81 MG tablet Take 1 tablet (81 mg total) by mouth daily. Hold aspirin for 5 days for the surgery, restart it as per neuro surgery.  . clopidogrel (PLAVIX) 75 MG tablet Take 1 tablet (75 mg total) by mouth daily. Hold plavix for the surgery, restart it as per neuro surgery.  . Insulin Syringe-Needle U-100 (BD INSULIN SYRINGE U/F)  31G X 5/16" 1 ML MISC Use 4 times per day to inject insulin.  Marland Kitchen senna-docusate (SENOKOT-S) 8.6-50 MG tablet Take 2 tablets by mouth 2 (two) times daily.    FAMILY HISTORY:   His family history includes Alzheimer's disease in his sister; Asthma in his brother; CAD in his brother and sister; Diabetes in his other; Prostate cancer in his brother. There is no history of Colon cancer.  SOCIAL HISTORY:  He  reports that he has never smoked. He has never used smokeless tobacco. He reports that he does not drink alcohol or use drugs.  REVIEW OF SYSTEMS:    Could not be obtained due to patient altered mental status

## 2018-07-19 NOTE — Progress Notes (Signed)
Neurosurgery Service Progress Note  Subjective: No acute events overnight, dysphagia stable to slightly improved, able to tolerate semi-soft foods and liquids, pre-operative symptoms still resolved, ambulated with PT  Objective: Vitals:   07/18/18 2246 07/19/18 0313 07/19/18 0625 07/19/18 0736  BP: (!) 135/54 (!) 150/59 (!) 159/55 (!) 161/51  Pulse: (!) 57 64  (!) 57  Resp: 17 19  18   Temp: 97.9 F (36.6 C) (!) 97.5 F (36.4 C)  97.7 F (36.5 C)  TempSrc: Oral Oral  Oral  SpO2:      Weight:      Height:       Temp (24hrs), Avg:98.1 F (36.7 C), Min:97.5 F (36.4 C), Max:99.6 F (37.6 C)  CBC Latest Ref Rng & Units 07/16/2018 07/16/2018 07/13/2018  WBC 4.0 - 10.5 K/uL 10.8(H) 11.3(H) 6.1  Hemoglobin 13.0 - 17.0 g/dL 13.7 12.9(L) 12.6(L)  Hematocrit 39.0 - 52.0 % 43.4 40.7 39.9  Platelets 150 - 400 K/uL 244 274 252   BMP Latest Ref Rng & Units 07/18/2018 07/16/2018 07/13/2018  Glucose 70 - 99 mg/dL 150(H) 111(H) 245(H)  BUN 8 - 23 mg/dL 11 22 18   Creatinine 0.61 - 1.24 mg/dL 0.98 1.22 1.25(H)  BUN/Creat Ratio 10 - 24 - - -  Sodium 135 - 145 mmol/L 137 140 135  Potassium 3.5 - 5.1 mmol/L 4.1 4.1 4.5  Chloride 98 - 111 mmol/L 98 103 100  CO2 22 - 32 mmol/L 30 30 26   Calcium 8.9 - 10.3 mg/dL 8.5(L) 9.0 8.5(L)    Intake/Output Summary (Last 24 hours) at 07/19/2018 0759 Last data filed at 07/19/2018 0200 Gross per 24 hour  Intake 280 ml  Output 750 ml  Net -470 ml    Current Facility-Administered Medications:  .  amLODipine (NORVASC) tablet 10 mg, 10 mg, Oral, Daily, Judith Part, MD, 10 mg at 07/18/18 0934 .  bismuth subsalicylate (PEPTO BISMOL) 262 MG/15ML suspension 30 mL, 30 mL, Oral, BID PRN, Judith Part, MD .  chlorpheniramine-HYDROcodone (TUSSIONEX) 10-8 MG/5ML suspension 5 mL, 5 mL, Oral, Q6H, Shahmehdi, Seyed A, MD, 5 mL at 07/19/18 0625 .  clonazePAM (KLONOPIN) tablet 1 mg, 1 mg, Oral, QHS PRN, Judith Part, MD, 1 mg at 07/14/18 2235 .   cyclobenzaprine (FLEXERIL) tablet 5 mg, 5 mg, Oral, QHS PRN, Judith Part, MD, 5 mg at 07/16/18 2105 .  diclofenac sodium (VOLTAREN) 1 % transdermal gel 2 g, 2 g, Topical, QID PRN, Judith Part, MD .  dicyclomine (BENTYL) capsule 10 mg, 10 mg, Oral, BID, Judith Part, MD, 10 mg at 07/18/18 2153 .  erythromycin ophthalmic ointment 1 application, 1 application, Both Eyes, Daily PRN, Javier Mamone A, MD .  furosemide (LASIX) tablet 60 mg, 60 mg, Oral, Daily, Shedric Fredericks A, MD, 60 mg at 07/18/18 0930 .  gabapentin (NEURONTIN) capsule 300 mg, 300 mg, Oral, BID, Patt Steinhardt, Joyice Faster, MD, 300 mg at 07/18/18 2157 .  hydrALAZINE (APRESOLINE) tablet 25 mg, 25 mg, Oral, Q8H, Emojean Gertz, Joyice Faster, MD, 25 mg at 07/19/18 0626 .  HYDROmorphone (DILAUDID) injection 0.5 mg, 0.5 mg, Intravenous, Q3H PRN, Judith Part, MD, 0.5 mg at 07/17/18 2041 .  insulin aspart (novoLOG) injection 0-20 Units, 0-20 Units, Subcutaneous, TID WC, Arista Kettlewell, Joyice Faster, MD, 7 Units at 07/17/18 1256 .  insulin aspart (novoLOG) injection 0-5 Units, 0-5 Units, Subcutaneous, QHS, Judith Part, MD, 2 Units at 07/15/18 2133 .  insulin aspart (novoLOG) injection 10 Units, 10 Units, Subcutaneous, TID AC, Shahmehdi,  Seyed A, MD .  insulin detemir (LEVEMIR) injection 20 Units, 20 Units, Subcutaneous, BID, Shahmehdi, Seyed A, MD .  lactated ringers infusion, , Intravenous, Continuous, Belinda Block, MD, Last Rate: 10 mL/hr at 07/16/18 0854 .  meclizine (ANTIVERT) tablet 25 mg, 25 mg, Oral, BID PRN, Judith Part, MD .  nitroGLYCERIN (NITROSTAT) SL tablet 0.4-0.8 mg, 0.4-0.8 mg, Sublingual, Q5 Min x 3 PRN, Kariya Lavergne, Joyice Faster, MD .  oxyCODONE-acetaminophen (PERCOCET/ROXICET) 5-325 MG per tablet 1-2 tablet, 1-2 tablet, Oral, Q4H PRN, Judith Part, MD, 2 tablet at 07/18/18 2157 .  pantoprazole (PROTONIX) EC tablet 40 mg, 40 mg, Oral, Daily, Judith Part, MD, 40 mg at 07/18/18 0933 .   polyethylene glycol (MIRALAX / GLYCOLAX) packet 17 g, 17 g, Oral, Daily, Judith Part, MD, 17 g at 07/18/18 0937 .  potassium chloride (K-DUR,KLOR-CON) CR tablet 30 mEq, 30 mEq, Oral, Daily, Judith Part, MD, 30 mEq at 07/18/18 0933 .  prednisoLONE acetate (PRED FORTE) 1 % ophthalmic suspension 2 drop, 2 drop, Both Eyes, TID AC & HS, Kailah Pennel, Joyice Faster, MD, 2 drop at 07/18/18 2204 .  primidone (MYSOLINE) tablet 50 mg, 50 mg, Oral, BID, Judith Part, MD, 50 mg at 07/18/18 2154 .  propranolol (INDERAL) tablet 40 mg, 40 mg, Oral, TID, Judith Part, MD, 40 mg at 07/18/18 2158 .  rosuvastatin (CRESTOR) tablet 20 mg, 20 mg, Oral, Once per day on Tue Thu, Sumaiyah Markert A, MD, 20 mg at 07/17/18 2244 .  senna-docusate (Senokot-S) tablet 2 tablet, 2 tablet, Oral, BID, Judith Part, MD, 2 tablet at 07/18/18 2155   Physical Exam: AOx3, PERRL, EOMI, FS, TM Strength 5/5 in LUE/LLE, Strength 5/5 in RUE/RLE SILTx4 No hoffman's, no clonus  Assessment & Plan: 77 y.o. man s/p 4 level ACDF, recovering well, strength improved, radicular pain resolved, numbness improved. -SNF discharge pending -okay for DVT chemoprophylaxis with unfractionated SQH or enoxaparin -ambulate tid as tolerated -discussed post-operative dysphagia with patient, will continue to improve but without steroids he will have a few more days of dysphagia and odynophagia -wound care and activity instructions placed in the patient's discharge instructions in [place]  07/19/18 7:59 AM

## 2018-07-20 ENCOUNTER — Inpatient Hospital Stay (HOSPITAL_COMMUNITY): Payer: Medicare Other

## 2018-07-20 ENCOUNTER — Encounter (HOSPITAL_COMMUNITY): Payer: Self-pay | Admitting: Cardiology

## 2018-07-20 DIAGNOSIS — I5030 Unspecified diastolic (congestive) heart failure: Secondary | ICD-10-CM

## 2018-07-20 DIAGNOSIS — R4189 Other symptoms and signs involving cognitive functions and awareness: Secondary | ICD-10-CM

## 2018-07-20 DIAGNOSIS — R748 Abnormal levels of other serum enzymes: Secondary | ICD-10-CM

## 2018-07-20 DIAGNOSIS — N183 Chronic kidney disease, stage 3 (moderate): Secondary | ICD-10-CM

## 2018-07-20 LAB — BASIC METABOLIC PANEL
ANION GAP: 9 (ref 5–15)
ANION GAP: 11 (ref 5–15)
ANION GAP: 9 (ref 5–15)
ANION GAP: 9 (ref 5–15)
Anion gap: 10 (ref 5–15)
BUN: 13 mg/dL (ref 8–23)
BUN: 12 mg/dL (ref 8–23)
BUN: 12 mg/dL (ref 8–23)
BUN: 11 mg/dL (ref 8–23)
BUN: 13 mg/dL (ref 8–23)
CALCIUM: 8.1 mg/dL — AB (ref 8.9–10.3)
CALCIUM: 8.4 mg/dL — AB (ref 8.9–10.3)
CALCIUM: 8 mg/dL — AB (ref 8.9–10.3)
CHLORIDE: 104 mmol/L (ref 98–111)
CHLORIDE: 106 mmol/L (ref 98–111)
CO2: 27 mmol/L (ref 22–32)
CO2: 27 mmol/L (ref 22–32)
CO2: 27 mmol/L (ref 22–32)
CO2: 25 mmol/L (ref 22–32)
CO2: 25 mmol/L (ref 22–32)
CREATININE: 0.76 mg/dL (ref 0.61–1.24)
Calcium: 8.3 mg/dL — ABNORMAL LOW (ref 8.9–10.3)
Calcium: 8 mg/dL — ABNORMAL LOW (ref 8.9–10.3)
Chloride: 101 mmol/L (ref 98–111)
Chloride: 102 mmol/L (ref 98–111)
Chloride: 105 mmol/L (ref 98–111)
Creatinine, Ser: 0.86 mg/dL (ref 0.61–1.24)
Creatinine, Ser: 0.77 mg/dL (ref 0.61–1.24)
Creatinine, Ser: 0.72 mg/dL (ref 0.61–1.24)
Creatinine, Ser: 0.79 mg/dL (ref 0.61–1.24)
GFR calc non Af Amer: >60 mL/min (ref >60)
GFR calc non Af Amer: >60 mL/min (ref >60)
Glucose, Bld: 205 mg/dL — ABNORMAL HIGH (ref 70–99)
Glucose, Bld: 150 mg/dL — ABNORMAL HIGH (ref 70–99)
Glucose, Bld: 160 mg/dL — ABNORMAL HIGH (ref 70–99)
Glucose, Bld: 177 mg/dL — ABNORMAL HIGH (ref 70–99)
Glucose, Bld: 261 mg/dL — ABNORMAL HIGH (ref 70–99)
POTASSIUM: 4.1 mmol/L (ref 3.5–5.1)
POTASSIUM: 4 mmol/L (ref 3.5–5.1)
POTASSIUM: 4.1 mmol/L (ref 3.5–5.1)
Potassium: 3.9 mmol/L (ref 3.5–5.1)
Potassium: 3.8 mmol/L (ref 3.5–5.1)
SODIUM: 139 mmol/L (ref 135–145)
Sodium: 137 mmol/L (ref 135–145)
Sodium: 140 mmol/L (ref 135–145)
Sodium: 140 mmol/L (ref 135–145)
Sodium: 141 mmol/L (ref 135–145)

## 2018-07-20 LAB — PHOSPHORUS
PHOSPHORUS: 1.3 mg/dL — AB (ref 2.5–4.6)
Phosphorus: 3.4 mg/dL (ref 2.5–4.6)

## 2018-07-20 LAB — GLUCOSE, CAPILLARY
GLUCOSE-CAPILLARY: 234 mg/dL — AB (ref 70–99)
GLUCOSE-CAPILLARY: 232 mg/dL — AB (ref 70–99)
GLUCOSE-CAPILLARY: 161 mg/dL — AB (ref 70–99)
GLUCOSE-CAPILLARY: 146 mg/dL — AB (ref 70–99)
GLUCOSE-CAPILLARY: 152 mg/dL — AB (ref 70–99)
GLUCOSE-CAPILLARY: 150 mg/dL — AB (ref 70–99)
GLUCOSE-CAPILLARY: 155 mg/dL — AB (ref 70–99)
GLUCOSE-CAPILLARY: 119 mg/dL — AB (ref 70–99)
GLUCOSE-CAPILLARY: 160 mg/dL — AB (ref 70–99)
GLUCOSE-CAPILLARY: 134 mg/dL — AB (ref 70–99)
Glucose-Capillary: 135 mg/dL — ABNORMAL HIGH (ref 70–99)
Glucose-Capillary: 139 mg/dL — ABNORMAL HIGH (ref 70–99)
Glucose-Capillary: 143 mg/dL — ABNORMAL HIGH (ref 70–99)
Glucose-Capillary: 149 mg/dL — ABNORMAL HIGH (ref 70–99)
Glucose-Capillary: 159 mg/dL — ABNORMAL HIGH (ref 70–99)
Glucose-Capillary: 171 mg/dL — ABNORMAL HIGH (ref 70–99)
Glucose-Capillary: 209 mg/dL — ABNORMAL HIGH (ref 70–99)
Glucose-Capillary: 214 mg/dL — ABNORMAL HIGH (ref 70–99)
Glucose-Capillary: 216 mg/dL — ABNORMAL HIGH (ref 70–99)
Glucose-Capillary: 219 mg/dL — ABNORMAL HIGH (ref 70–99)
Glucose-Capillary: 242 mg/dL — ABNORMAL HIGH (ref 70–99)
Glucose-Capillary: 302 mg/dL — ABNORMAL HIGH (ref 70–99)

## 2018-07-20 LAB — MAGNESIUM
MAGNESIUM: 1.8 mg/dL (ref 1.7–2.4)
MAGNESIUM: 1.8 mg/dL (ref 1.7–2.4)

## 2018-07-20 LAB — ECHOCARDIOGRAM COMPLETE
HEIGHTINCHES: 68 in
Weight: 4306.91 oz

## 2018-07-20 LAB — PROTIME-INR
INR: 1.2
Prothrombin Time: 15.1 seconds (ref 11.4–15.2)

## 2018-07-20 LAB — POCT I-STAT 3, ART BLOOD GAS (G3+)
ACID-BASE EXCESS: 3 mmol/L — AB (ref 0.0–2.0)
Bicarbonate: 29.7 mmol/L — ABNORMAL HIGH (ref 20.0–28.0)
O2 SAT: 99 %
PH ART: 7.426 (ref 7.350–7.450)
PO2 ART: 126 mmHg — AB (ref 83.0–108.0)
Patient temperature: 32.8
TCO2: 31 mmol/L (ref 22–32)
pCO2 arterial: 43.1 mmHg (ref 32.0–48.0)

## 2018-07-20 LAB — POCT I-STAT, CHEM 8
BUN: 15 mg/dL (ref 8–23)
CALCIUM ION: 1.13 mmol/L — AB (ref 1.15–1.40)
Chloride: 98 mmol/L (ref 98–111)
Creatinine, Ser: 0.8 mg/dL (ref 0.61–1.24)
GLUCOSE: 280 mg/dL — AB (ref 70–99)
HCT: 40 % (ref 39.0–52.0)
HEMOGLOBIN: 13.6 g/dL (ref 13.0–17.0)
Potassium: 3.6 mmol/L (ref 3.5–5.1)
SODIUM: 137 mmol/L (ref 135–145)
TCO2: 28 mmol/L (ref 22–32)

## 2018-07-20 LAB — TROPONIN I
Troponin I: 0.3 ng/mL (ref <0.03)
Troponin I: 0.25 ng/mL (ref <0.03)
Troponin I: 0.16 ng/mL (ref <0.03)

## 2018-07-20 LAB — APTT: APTT: 39 s — AB (ref 24–36)

## 2018-07-20 MED ORDER — ENOXAPARIN SODIUM 40 MG/0.4ML ~~LOC~~ SOLN
40.0000 mg | SUBCUTANEOUS | Status: DC
  Administered 2018-07-20 – 2018-08-07 (×19): 40 mg via SUBCUTANEOUS
  Filled 2018-07-20 (×18): qty 0.4

## 2018-07-20 MED ORDER — PERFLUTREN LIPID MICROSPHERE
1.0000 mL | INTRAVENOUS | Status: AC | PRN
  Administered 2018-07-20: 4 mL via INTRAVENOUS
  Filled 2018-07-20: qty 10

## 2018-07-20 MED ORDER — PRIMIDONE 50 MG PO TABS
50.0000 mg | ORAL_TABLET | Freq: Two times a day (BID) | ORAL | Status: DC
  Administered 2018-07-20 – 2018-08-07 (×34): 50 mg
  Filled 2018-07-20 (×38): qty 1

## 2018-07-20 MED ORDER — ROSUVASTATIN CALCIUM 20 MG PO TABS
20.0000 mg | ORAL_TABLET | ORAL | Status: DC
  Administered 2018-07-24 – 2018-08-07 (×6): 20 mg
  Filled 2018-07-20 (×6): qty 1

## 2018-07-20 MED ORDER — VITAL HIGH PROTEIN PO LIQD
1000.0000 mL | ORAL | Status: DC
  Administered 2018-07-20 – 2018-07-25 (×6): 1000 mL

## 2018-07-20 MED ORDER — POLYVINYL ALCOHOL 1.4 % OP SOLN
1.0000 [drp] | Freq: Three times a day (TID) | OPHTHALMIC | Status: DC
  Administered 2018-07-20 – 2018-08-07 (×56): 1 [drp] via OPHTHALMIC
  Filled 2018-07-20 (×2): qty 15

## 2018-07-20 MED ORDER — POTASSIUM CHLORIDE 10 MEQ/50ML IV SOLN
10.0000 meq | INTRAVENOUS | Status: AC
  Administered 2018-07-20 (×4): 10 meq via INTRAVENOUS
  Filled 2018-07-20 (×4): qty 50

## 2018-07-20 MED ORDER — PRO-STAT SUGAR FREE PO LIQD
30.0000 mL | Freq: Two times a day (BID) | ORAL | Status: DC
  Administered 2018-07-20 – 2018-07-26 (×13): 30 mL
  Filled 2018-07-20 (×13): qty 30

## 2018-07-20 MED ORDER — SODIUM PHOSPHATES 45 MMOLE/15ML IV SOLN
30.0000 mmol | Freq: Once | INTRAVENOUS | Status: AC
  Administered 2018-07-20: 30 mmol via INTRAVENOUS
  Filled 2018-07-20: qty 10

## 2018-07-20 NOTE — Progress Notes (Signed)
EEG Completed; Results Pending  

## 2018-07-20 NOTE — Plan of Care (Signed)
Pt on 33 degree TTM protocol s/p unwitnessed arrest on 4N around 16:45. Reached goal temp at 22:32 this evening.  Maintaining q2hr turns and oral care. No family has called or come to bedside since pt's arrest. Will continue to monitor. Problem: Clinical Measurements: Goal: Ability to maintain clinical measurements within normal limits will improve Outcome: Progressing Goal: Will remain free from infection Outcome: Progressing Goal: Diagnostic test results will improve Outcome: Progressing Goal: Respiratory complications will improve Outcome: Progressing Goal: Cardiovascular complication will be avoided Outcome: Progressing   Problem: Activity: Goal: Risk for activity intolerance will decrease Outcome: Progressing   Problem: Coping: Goal: Level of anxiety will decrease Outcome: Progressing   Problem: Elimination: Goal: Will not experience complications related to bowel motility Outcome: Progressing Goal: Will not experience complications related to urinary retention Outcome: Progressing   Problem: Pain Managment: Goal: General experience of comfort will improve Outcome: Progressing   Problem: Safety: Goal: Ability to remain free from injury will improve Outcome: Progressing   Problem: Skin Integrity: Goal: Risk for impaired skin integrity will decrease Outcome: Progressing   Problem: Cardiac: Goal: Ability to achieve and maintain adequate cardiopulmonary perfusion will improve Outcome: Progressing   Problem: Neurologic: Goal: Promote progressive neurologic recovery Outcome: Progressing   Problem: Skin Integrity: Goal: Risk for impaired skin integrity will be minimized. Outcome: Progressing   Problem: Nutrition: Goal: Adequate nutrition will be maintained Outcome: Not Progressing   Problem: Education: Goal: Ability to manage disease process will improve Outcome: Not Progressing

## 2018-07-20 NOTE — Progress Notes (Signed)
Initial Nutrition Assessment  DOCUMENTATION CODES:   Obesity unspecified  INTERVENTION:   Tube Feeding:  Vital High Protein @ 60 ml/hr Pro-Stat 30 mL BID Provide 1640 kcals, 157 g of protein and 1210 mL of free water   NUTRITION DIAGNOSIS:   Inadequate oral intake related to acute illness as evidenced by NPO status.  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 Pt suffered cardiac arrest on 9/19 and was resuscitated, intubated, transferred to ICU and started on TTM. PMH includes DM, CAD/CABG, chronic diastolic heart failure, CKD II/III   9/16 Anterior cervical disc fusion 9/19 Cardiac arrest, Intubated, TTM initiated  Patient is currently intubated on ventilator support, TTM, currently sedated and paralyzed, on levophed for pressor support MV: 7.7 L/min Temp (24hrs), Avg:92.9 F (33.8 C), Min:89.8 F (32.1 C), Max:98.8 F (37.1 C)  Recorded po intake 50-100% of meals prior to intubation  Current wt 122.1 kg; weight on admission (9/13) 127 kg. No weights in between.   No family present; unable to obtain diet and weight history  Labs: CBGs 135-156, Creatinine wdl,  Meds: insulin drip, fentanyl, versed, nimbex, levophed, decadron  NUTRITION - FOCUSED PHYSICAL EXAM:    Most Recent Value  Orbital Region  No depletion  Upper Arm Region  No depletion  Thoracic and Lumbar Region  Unable to assess [TTM pads]  Buccal Region  Unable to assess [ET tube holder/pads]  Temple Region  No depletion  Clavicle Bone Region  No depletion  Clavicle and Acromion Bone Region  No depletion  Scapular Bone Region  No depletion  Dorsal Hand  No depletion  Patellar Region  Unable to assess [TTM pads]  Anterior Thigh Region  Unable to assess [TTM  pads]  Posterior Calf Region  Unable to assess  Edema (RD Assessment)  Moderate       Diet Order:   Diet Order            Diet NPO time specified  Diet effective now        Diet Carb Modified              EDUCATION NEEDS:   Not appropriate for education at this time  Skin:  Skin Assessment: Skin Integrity Issues: Skin Integrity Issues:: Incisions Incisions: neck  Last BM:  9/19  Height:   Ht Readings from Last 1 Encounters:  07/20/18 5\' 8"  (1.727 m)    Weight:   Wt Readings from Last 1 Encounters:  07/20/18 122.1 kg    Ideal Body Weight:  70 kg  BMI:  Body mass index is 40.93 kg/m.  Estimated Nutritional Needs:   Kcal:  1224-8250 kcals   Protein:  140-175 g  Fluid:  >/= 1.7 L   Kerman Passey MS, RD, LDN, CNSC [phone] Pager  [phone] Weekend/On-Call Pager

## 2018-07-20 NOTE — Progress Notes (Signed)
[facility] Progress Note Patient Name: Edward [name] DOB: 24-Mar-1941 MRN: [id number]   Date of Service  07/20/2018  HPI/Events of Note  Hypokalemia  eICU Interventions  Potassium replaced     Intervention Category Intermediate Interventions: Electrolyte abnormality - evaluation and management  Shiri Hodapp 07/20/2018, 1:19 AM

## 2018-07-20 NOTE — Progress Notes (Signed)
PT Cancellation Note  Patient Details Name: Edward [name] MRN: [id number] DOB: 1941-07-13   Cancelled Treatment:    Reason Eval/Treat Not Completed: Patient not medically ready Pt remains intubated and not medically appropriate  [name] B Monserrate Blaschke 07/20/2018, 6:40 AM Elwyn Reach, PT Acute Rehabilitation Services Pager: [phone] Office: [phone]

## 2018-07-20 NOTE — Progress Notes (Signed)
Notified [facility] of troponin 0.30. No new orders. Will continue to monitor.

## 2018-07-20 NOTE — Progress Notes (Signed)
*  PRELIMINARY RESULTS* Echocardiogram 2D Echocardiogram has been performed with Definity.  Edward [name] 07/20/2018, 4:21 PM

## 2018-07-20 NOTE — Consult Note (Addendum)
Cardiology Consultation:   Patient ID: Edward [name] MRN: [id number]; DOB: [birth year]  Admit date: 07/13/2018 Date of Consult: 07/20/2018  Primary Care Provider: Ann Held, DO Primary Cardiologist: Sinclair Grooms, MD  Primary Electrophysiologist:  None    Patient Profile:   Edward [name] is a 77 y.o. male with a hx of CAD s/p CABG 07/5620, chronic diastolic HF, HTN, HLD, DM-2, OSA, CKD-3 with recent pre-op eval forto cervical spondylosis with cord compression, radiculopathy    who is being seen today for the evaluation of cardiac arrest at the request of Dr. Elwyn Reach.  History of Present Illness:   Mr. [name] with above hx was seen 07/10/18 and updated hx below.     Pt was hospitalized in 03/08/18 with chest pain.  He did have cardiac cath which revealed 60% proximal LADstenosis, widely patent mid LAD stent, patent LM, totally occluded ostial circumflex to proximal circumflex, moderate nonobstructive disease along RCA and patent SVG-OM2 and patent LIMA-dLAD. There was no identifiable culprit for his presentation and it was recommended to consider a MSK or neuropathic etiology as the source of his discomfort. He had cardiac CT which was neg for PE.  He did have neuropathic pain and he continue on gabapentin.  He did return to ER 03/24/18 for fluttering.  He wore a heart monitor and this revealed SR with 1st degree AV block and PACs.  No a fib.   Pt underwent surgery 07/16/18 with C3-C4, C4-C5, C5-C6, C6-C7 Anterior Cervical Discectomy and Instrumented Fusion.  Post op pt was progressing with some dysphagia, not unexpected.  He was to go to SNF yesterday but he had increase of dysphagia and discharge was cancelled.  Later that day at 4:45 PM he was found down on the floor.  He was pulseless, code blue called.  Underwent 3 min. Of CPR no shocks and pt was intubated.   He was asystole and then unresponsive.  CCM felt this was most likely respiratory in nature especially with him  complaining of difficulty swallowing having history of obstructive sleep apnea and recent cervical surgery.  Hypothermia protocol was started.   He was sedated with propofol and fentanyl.   EKG SR with 1st degree AV Block and no acute changes. I personally reviewed  This AM EKG SB at 49 with 1st degree AV block.  Tele with SB with PVCs.  And P wave appears to vary at times SB with 1st degree AV block and at other times on Q wave.  I personally reviewed.   Troponin <0.03,  0.30 and 0.25    Currently tongue is swollen.  Question of anaphylaxis as well.   SB but on numerous meds.  Versed, fentanyl levophed, Insulin.  Cisatracurium.  Pt sedated and not responding currently.   BP 154/58 to 139/54.    Past Medical History:  Diagnosis Date  . Adenomatous colon polyp 04/2011  . CAD (coronary artery disease)    a. 01/2015 DES to LAD  b. 12/2015: [place] 85% oLCx lesion--> rx therapy.  . Chronic diastolic CHF (congestive heart failure) ([place])    a. 05/2017 Echo: EF 60-65%, Gr1 DD, no rwma, mildly dil LA, nl RV fxn, mild TR.  . CKD (chronic kidney disease), stage II    a. probable CKD II-III with baseline CR 1.1-1.3.  . DM type 2 (diabetes mellitus, type 2), insulin dependent 01/29/2014   fasting cbg 50-120 with new regimen  . Dyslipidemia, goal LDL below 70 01/29/2014  . Enlarged  prostate   . Essential tremor    a. on proprnolol  . GERD (gastroesophageal reflux disease)   . Hx of CABG   . Hypertension   . Internal hemorrhoid   . Sleep apnea    does not use cpap (06/04/2015)  . TIA (transient ischemic attack) 2002    Past Surgical History:  Procedure Laterality Date  . ANTERIOR CERVICAL DECOMPRESSION/DISCECTOMY FUSION 4 LEVELS N/A 07/16/2018   Procedure: CERVICAL THREE-FOUR CERVICAL FOUR-FIVE CERVICAL FIVE-SIX CERVICAL SIX-SEVEN ANTERIOR CERVICAL DECOMPRESSION/DISCECTOMY/FUSION;  Surgeon: Judith Part, MD;  Location: [facility];  Service: Neurosurgery;  Laterality: N/A;  CERVICAL THREE-FOUR CERVICAL  FOUR-FIVE CERVICAL FIVE-SIX CERVICAL SIX-SEVEN ANTERIOR CERVICAL DECOMPRESSION/DISCECTOMY/FUSION  . BACK SURGERY    . CARDIAC CATHETERIZATION  01/28/14   + CAD treat medically  . CARDIAC CATHETERIZATION N/A 12/21/2015   Procedure: Right/Left Heart Cath and Coronary Angiography;  Surgeon: Belva Crome, MD;  Location: [facility];  Service: Cardiovascular;  Laterality: N/A;  . CARDIAC CATHETERIZATION N/A 11/02/2016   Procedure: Left Heart Cath and Coronary Angiography;  Surgeon: Nelva Bush, MD;  Location: [facility];  Service: Cardiovascular;  Laterality: N/A;  . CATARACT EXTRACTION Bilateral   . CATARACT EXTRACTION, BILATERAL Bilateral   . CORONARY ANGIOPLASTY WITH STENT PLACEMENT  01/30/2015   DES [place] to LAD by Dr Tamala Julian  . CORONARY ARTERY BYPASS GRAFT N/A 11/09/2016   Procedure: CORONARY ARTERY BYPASS GRAFTING (CABG) x 2;  Surgeon: Melrose Nakayama, MD;  Location: [facility];  Service: Open Heart Surgery;  Laterality: N/A;  . ENDOVEIN HARVEST OF GREATER SAPHENOUS VEIN Left 11/09/2016   Procedure: ENDOVEIN HARVEST OF GREATER SAPHENOUS VEIN;  Surgeon: Melrose Nakayama, MD;  Location: [facility];  Service: Open Heart Surgery;  Laterality: Left;  . JOINT REPLACEMENT    . LEFT HEART CATH AND CORS/GRAFTS ANGIOGRAPHY N/A 03/09/2018   Procedure: LEFT HEART CATH AND CORS/GRAFTS ANGIOGRAPHY;  Surgeon: Belva Crome, MD;  Location: [facility];  Service: Cardiovascular;  Laterality: N/A;  . LEFT HEART CATHETERIZATION WITH CORONARY ANGIOGRAM N/A 01/28/2014   Procedure: LEFT HEART CATHETERIZATION WITH CORONARY ANGIOGRAM;  Surgeon: Sinclair Grooms, MD;  Location: [facility] CATH LAB;  Service: Cardiovascular;  Laterality: N/A;  . LEFT HEART CATHETERIZATION WITH CORONARY ANGIOGRAM N/A 01/30/2015   Procedure: LEFT HEART CATHETERIZATION WITH CORONARY ANGIOGRAM;  Surgeon: Belva Crome, MD;  Location: [facility] CATH LAB;  Service: Cardiovascular;  Laterality: N/A;  . LUMBAR [place]    .  SHOULDER ARTHROSCOPY Right 07/30/2014   Procedure: Right Shoulder Arthroscopy, Debridement, Decompression, Manipulation Under Anesthesia;  Surgeon: Newt Minion, MD;  Location: [facility];  Service: Orthopedics;  Laterality: Right;  . TEE WITHOUT CARDIOVERSION N/A 11/09/2016   Procedure: TRANSESOPHAGEAL ECHOCARDIOGRAM (TEE);  Surgeon: Melrose Nakayama, MD;  Location: [facility];  Service: Open Heart Surgery;  Laterality: N/A;  . TOTAL KNEE ARTHROPLASTY Bilateral      Home Medications:  Prior to Admission medications   Medication Sig Start Date End Date Taking? Authorizing Provider  amLODipine (NORVASC) 10 MG tablet Take 1 tablet (10 mg total) by mouth daily. 03/13/18  Yes Roma Schanz R, DO  bismuth subsalicylate (PEPTO BISMOL) 262 MG/15ML suspension Take 30 mLs by mouth as needed for indigestion.   Yes [provider]  clonazePAM (KLONOPIN) 1 MG tablet Take 1 tablet (1 mg total) by mouth at bedtime. Patient taking differently: Take 1 mg by mouth at bedtime as needed (for sleep or anxiety).  03/13/18  Yes Ann Held, DO  cyclobenzaprine (FLEXERIL) 5 MG tablet Take 1 tablet (5 mg total) by mouth at bedtime as needed for muscle spasms (headache). Patient taking differently: Take 5 mg by mouth at bedtime as needed (for muscle spasms, leg cramps, or headaches).  04/23/18  Yes Patel, Donika K, DO  diclofenac sodium (VOLTAREN) 1 % GEL Apply 2 g topically 4 (four) times daily as needed (for pain).    Yes [provider]  dicyclomine (BENTYL) 10 MG capsule Take 10 mg by mouth 2 (two) times daily.  01/01/16  Yes [provider]  erythromycin ophthalmic ointment Place 1 application into both eyes See admin instructions. Apply to both eyelids once a day as needed for itching   Yes [provider]  esomeprazole (NEXIUM) 40 MG capsule Take 40 mg by mouth daily as needed (for reflux).  01/01/16  Yes [provider]  furosemide (LASIX) 40 MG tablet Take 1.5  tablets (60 mg total) by mouth daily. 03/29/18 07/13/18 Yes Duke, Tami Lin, PA  gabapentin (NEURONTIN) 300 MG capsule take 1 capsule by mouth at bedtime for 1 week then INCREASE TO 1 CAP TWICE DAILY Patient taking differently: Take 300 mg by mouth 2 (two) times daily.  03/13/18  Yes Roma Schanz R, DO  hydrALAZINE (APRESOLINE) 25 MG tablet Take 1 tablet (25 mg total) by mouth 3 (three) times daily. 03/28/18 07/13/18 Yes Duke, Tami Lin, PA  insulin aspart (NOVOLOG) 100 UNIT/ML injection Inject 40 Units into the skin 3 (three) times daily before meals. 06/25/18  Yes Philemon Kingdom, MD  insulin NPH Human (NOVOLIN N RELION) 100 UNIT/ML injection Inject 30 Units into the skin 2 (two) times daily before a meal.    Yes [provider]  meclizine (ANTIVERT) 25 MG tablet Take 1 tablet (25 mg total) by mouth 2 (two) times daily as needed for dizziness. 06/10/17  Yes Theora Gianotti, NP  nitroGLYCERIN (NITROSTAT) 0.4 MG SL tablet Place 1 tablet (0.4 mg total) under the tongue every 5 (five) minutes as needed for chest pain. Max: 3 doses per episode. Patient taking differently: Place 0.4-0.8 mg under the tongue every 5 (five) minutes x 3 doses as needed for chest pain.  11/07/17  Yes Burtis Junes, NP  oxyCODONE-acetaminophen (PERCOCET/ROXICET) 5-325 MG tablet Take 1-2 tablets by mouth every 4 (four) hours as needed for moderate pain or severe pain. 07/11/18  Yes Hosie Poisson, MD  polyethylene glycol (MIRALAX / GLYCOLAX) packet Take 17 g by mouth daily. Patient taking differently: Take 17 g by mouth daily as needed for mild constipation.  07/12/18  Yes Hosie Poisson, MD  Polyvinyl Alcohol-Povidone (REFRESH OP) Place 1 drop into both eyes 3 (three) times daily as needed (for dry eyes). Reported on 12/25/2015   Yes [provider]  potassium chloride SA (K-DUR,KLOR-CON) 20 MEQ tablet TAKE 1 & 1/2 (ONE & ONE-HALF) TABLETS BY MOUTH ONCE DAILY Patient taking differently: Take 30  mEq by mouth daily.  04/27/18  Yes Duke, Tami Lin, PA  prednisoLONE acetate (PRED FORTE) 1 % ophthalmic suspension Place 2 drops into both eyes 2 (two) times daily as needed for itching.   Yes [provider]  primidone (MYSOLINE) 50 MG tablet Take 1 tablet (50 mg total) by mouth 2 (two) times daily. 04/23/18  Yes Patel, Donika K, DO  propranolol (INDERAL) 40 MG tablet Take 1 tablet (40 mg total) by mouth 3 (three) times daily. 03/13/18  Yes Carollee Herter,  Alferd Apa, DO  rosuvastatin (CRESTOR) 20 MG tablet Take 1 tablet (20 mg total) by mouth daily. Patient taking differently: Take 20 mg by mouth 2 (two) times a week.  03/13/18 03/08/19 Yes Ann Held, DO  albuterol (VENTOLIN HFA) 108 (90 Base) MCG/ACT inhaler inhale 2 puffs by mouth every 6 hours if needed for wheezing or shortness of breath Patient not taking: Reported on 07/13/2018 07/24/17   Ann Held, DO  aspirin EC 81 MG tablet Take 1 tablet (81 mg total) by mouth daily. Hold aspirin for 5 days for the surgery, restart it as per neuro surgery. 07/11/18   Hosie Poisson, MD  chlorpheniramine-HYDROcodone (TUSSIONEX) 10-8 MG/5ML SUER Take 5 mLs by mouth every 6 (six) hours. 07/19/18   Shahmehdi, Valeria Batman, MD  clopidogrel (PLAVIX) 75 MG tablet Take 1 tablet (75 mg total) by mouth daily. Hold plavix for the surgery, restart it as per neuro surgery. 07/11/18   Hosie Poisson, MD  insulin aspart (NOVOLOG) 100 UNIT/ML injection Inject 10 Units into the skin 3 (three) times daily before meals. 07/19/18   Shahmehdi, Valeria Batman, MD  insulin aspart (NOVOLOG) 100 UNIT/ML injection Inject 0-5 Units into the skin at bedtime. 07/19/18   Shahmehdi, Valeria Batman, MD  insulin aspart (NOVOLOG) 100 UNIT/ML injection Inject 0-20 Units into the skin 3 (three) times daily with meals. 07/19/18   Shahmehdi, Valeria Batman, MD  insulin detemir (LEVEMIR) 100 UNIT/ML injection Inject 0.2 mLs (20 Units total) into the skin 2 (two) times daily. 07/19/18   Shahmehdi, Valeria Batman,  MD  Insulin Syringe-Needle U-100 (BD INSULIN SYRINGE U/F) 31G X 5/16" 1 ML MISC Use 4 times per day to inject insulin. 01/16/18   Philemon Kingdom, MD  senna-docusate (SENOKOT-S) 8.6-50 MG tablet Take 2 tablets by mouth 2 (two) times daily. 07/12/18   Hosie Poisson, MD    Inpatient Medications: Scheduled Meds: . artificial tears  1 application Both Eyes I4P  . chlorhexidine gluconate (MEDLINE KIT)  15 mL Mouth Rinse BID  . dexamethasone  2 mg Intravenous Q8H  . dicyclomine  10 mg Per Tube BID  . enoxaparin (LOVENOX) injection  40 mg Subcutaneous Q24H  . feeding supplement (PRO-STAT SUGAR FREE 64)  30 mL Per Tube BID  . feeding supplement (VITAL HIGH PROTEIN)  1,000 mL Per Tube Q24H  . fentaNYL (SUBLIMAZE) injection  50 mcg Intravenous Once  . gabapentin  300 mg Per Tube Q12H  . mouth rinse  15 mL Mouth Rinse 10 times per day  . midazolam  1 mg Intravenous Once  . polyethylene glycol  17 g Oral Daily  . polyvinyl alcohol  1 drop Both Eyes TID  . prednisoLONE acetate  2 drop Both Eyes TID AC & HS  . primidone  50 mg Per Tube BID  . [START ON 07/24/2018] rosuvastatin  20 mg Per Tube Once per day on Tue Thu  . senna-docusate  2 tablet Oral BID   Continuous Infusions: . sodium chloride 100 mL/hr at 07/20/18 0700  . cisatracurium (NIMBEX) infusion 1.496 mcg/kg/min (07/20/18 0920)  . famotidine (PEPCID) IV 20 mg (07/20/18 1023)  . fentaNYL infusion INTRAVENOUS 250 mcg/hr (07/20/18 0700)  . insulin (NOVOLIN-R) infusion 7.4 Units/hr (07/20/18 0704)  . lactated ringers 10 mL/hr at 07/16/18 0854  . midazolam (VERSED) infusion 5 mg/hr (07/20/18 0824)  . norepinephrine (LEVOPHED) Adult infusion 8 mcg/min (07/20/18 0802)   PRN Meds: [COMPLETED] cisatracurium **AND** cisatracurium (NIMBEX) infusion **AND** cisatracurium, erythromycin, fentaNYL, HYDROmorphone (DILAUDID) injection,  midazolam  Allergies:    Allergies  Allergen Reactions  . Pneumococcal Vaccines Anaphylaxis    Social History:    Social History   Socioeconomic History  . Marital status: Married    Spouse name: Not on file  . Number of children: 0  . Years of education: Not on file  . Highest education level: Not on file  Occupational History  . Occupation: retired  Scientific laboratory technician  . Financial resource strain: Not on file  . Food insecurity:    Worry: Not on file    Inability: Not on file  . Transportation needs:    Medical: Not on file    Non-medical: Not on file  Tobacco Use  . Smoking status: Never Smoker  . Smokeless tobacco: Never Used  Substance and Sexual Activity  . Alcohol use: No    Alcohol/week: 0.0 standard drinks  . Drug use: No  . Sexual activity: Not Currently  Lifestyle  . Physical activity:    Days per week: Not on file    Minutes per session: Not on file  . Stress: Not on file  Relationships  . Social connections:    Talks on phone: Not on file    Gets together: Not on file    Attends religious service: Not on file    Active member of club or organization: Not on file    Attends meetings of clubs or organizations: Not on file    Relationship status: Not on file  . Intimate partner violence:    Fear of current or ex partner: Not on file    Emotionally abused: Not on file    Physically abused: Not on file    Forced sexual activity: Not on file  Other Topics Concern  . Not on file  Social History Narrative   He is a Geophysicist/field seismologist by trade.   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   He lives alone.  His wife is living in [place] at this time.  They do not have children.   Highest level of education:  College graduate    Family History:    Family History  Problem Relation Age of Onset  . CAD Brother        2 brothers - CABG  . Alzheimer's disease Sister   . CAD Sister   . Prostate cancer Brother   . Asthma Brother        2 brothers   . Diabetes Other        entire family  . Colon cancer Neg Hx      ROS: Pt not responsive.  Please see the  history of present illness.  Per previous notes General:no colds or fevers, no weight changes Skin:no rashes or ulcers HEENT:no blurred vision, no congestion CV:see HPI PUL:see HPI GI:no diarrhea constipation or melena, no indigestion GU:no hematuria, no dysuria MS:no joint pain, no claudication Neuro:no syncope, no lightheadedness Endo:+ diabetes, no thyroid disease  All other ROS reviewed and negative.     Physical Exam/Data:   Vitals:   07/20/18 0645 07/20/18 0700 07/20/18 0715 07/20/18 0800  BP:  (!) 137/57  (!) 148/59  Pulse: (!) 46 (!) 44 (!) 44 (!) 45  Resp: '16 14 14 11  ' Temp:  (!) 90.9 F (32.7 C)    TempSrc:      SpO2: 100% 100% 100% 100%  Weight:      Height:        Intake/Output Summary (Last 24  hours) at 07/20/2018 1118 Last data filed at 07/20/2018 0800 Gross per 24 hour  Intake 1848.82 ml  Output 1675 ml  Net 173.82 ml   Filed Weights   07/13/18 1130 07/19/18 1830 07/20/18 0445  Weight: 127 kg 127 kg 122.1 kg   Body mass index is 40.93 kg/m.  General:  Well nourished, well developed, unresponsive on vent  HEENT: normal Lymph: no adenopathy Neck: no JVD Endocrine:  No thryomegaly Vascular: No carotid bruits; pedal pulses 2+ bilaterally   Cardiac:  normal S1, S2; RRR; no murmur, gallup rub or click   Lungs:  clear to auscultation bilaterally, from ant position no wheezing, rhonchi or rales  Abd: soft, nontender, no hepatomegaly  Ext: no edema Musculoskeletal:  No deformities, pt paralyzed  Skin: warm and dry  Neuro:  Paralyzed and sedated unable to eval. Psych: sedated on Vent   Relevant CV Studies: Cath 02/2018   Difficult procedure from left radial approach due to difficulty with torque control.  Would advise femoral approach in the future.  Eccentric 60% proximal LAD.  Widely patent mid LAD stent.  Antegrade flow to the apex.  Widely patent left main.  Totally occluded ostial circumflex to proximal circumflex.  Proximal mid and distal  RCA moderate disease with tandem 65 to 75% % distal segmental stenoses.  Patent saphenous vein graft to the second obtuse marginal which supplies the entire circumflex territory.  Widely patent LIMA to the distal segment of the LAD.  Competitive flow is noted.  Low normal LV systolic function with EF 50 to 55%.  Mildly elevated end-diastolic pressure.  Continuous sharp left chest pain not felt to be ischemia related.  RECOMMENDATIONS:   Consider musculoskeletal or neuropathic pain as a source of the patient's presentation.  Continue aggressive risk factor modification for secondary prevention of ischemic events.  If No Local complications, could be discharged later today.    Diagnostic Diagram        Laboratory Data:  Chemistry Recent Labs  Lab 07/19/18 1716  07/20/18 0110 07/20/18 0402 07/20/18 0755  NA 137   < > 137 137 140  K 4.0   < > 3.6 3.9 4.1  CL 95*   < > 98 101 102  CO2 26  --   --  27 27  GLUCOSE 318*   < > 280* 205* 150*  BUN 15   < > '15 13 12  ' CREATININE 1.30*   < > 0.80 0.86 0.77  CALCIUM 8.6*  --   --  8.1* 8.4*  GFRNONAA 52*  --   --  >60 >60  GFRAA 60*  --   --  >60 >60  ANIONGAP 16*  --   --  9 11   < > = values in this interval not displayed.    Recent Labs  Lab 07/13/18 1238 07/19/18 1716  PROT 5.9* 6.6  ALBUMIN 3.1* 3.2*  AST 20 135*  ALT 15 105*  ALKPHOS 88 89  BILITOT 0.7 1.0   Hematology Recent Labs  Lab 07/16/18 0242 07/16/18 1818 07/19/18 1716  07/19/18 2050 07/19/18 2305 07/20/18 0110  WBC 11.3* 10.8* 10.2  --   --   --   --   RBC 4.59 4.83 6.44*  --   --   --   --   HGB 12.9* 13.7 18.1*   < > 13.9 13.9 13.6  HCT 40.7 43.4 57.3*   < > 41.0 41.0 40.0  MCV 88.7 89.9 89.0  --   --   --   --  MCH 28.1 28.4 28.1  --   --   --   --   MCHC 31.7 31.6 31.6  --   --   --   --   RDW 13.5 13.4 13.1  --   --   --   --   PLT 274 244 145*  --   --   --   --    < > = values in this interval not displayed.   Cardiac  Enzymes Recent Labs  Lab 07/19/18 1716 07/19/18 2314 07/20/18 0402  TROPONINI <0.03 0.30* 0.25*   No results for input(s): TROPIPOC in the last 168 hours.  BNPNo results for input(s): BNP, PROBNP in the last 168 hours.  DDimer No results for input(s): DDIMER in the last 168 hours.  Radiology/Studies:  Dg Cervical Spine 1 View  Result Date: 07/16/2018 CLINICAL DATA:  C3-7 ACDF. EXAM: DG CERVICAL SPINE - 1 VIEW; DG C-ARM 61-120 MIN COMPARISON:  Cervical MRI 07/09/2018. FINDINGS: A single lateral spot fluoroscopic image of the cervical spine is submitted. Small Field-of-view does not allow accurate numbering of the cervical segments. There is an anterior plate and screws, the inferior components of which are not well visualized. No complications are identified. IMPRESSION: Lateral view following cervical fusion. The inferior aspect of the hardware is incompletely visualized, and small field-of-view does not allow accurate numbering of the cervical segments. Electronically Signed   By: Richardean Sale M.D.   On: 07/16/2018 17:30   Ct Head Wo Contrast  Result Date: 07/19/2018 CLINICAL DATA:  Syncopal episode, suspect cardiac etiology. Status post ACDF yesterday. EXAM: CT HEAD WITHOUT CONTRAST CT CERVICAL SPINE WITHOUT CONTRAST TECHNIQUE: Multidetector CT imaging of the head and cervical spine was performed following the standard protocol without intravenous contrast. Multiplanar CT image reconstructions of the cervical spine were also generated. COMPARISON:  CT angiogram head and neck July 09, 2018 and MRI cervical spine June 08, 2018. FINDINGS: CT HEAD FINDINGS BRAIN: No intraparenchymal hemorrhage, mass effect nor midline shift. The ventricles and sulci are normal for age. Patchy supratentorial white matter hypodensities within normal range for patient's age, though non-specific are most compatible with chronic small vessel ischemic disease. No acute large vascular territory infarcts. No abnormal  extra-axial fluid collections. Basal cisterns are patent. VASCULAR: Mild calcific atherosclerosis of the carotid siphons. SKULL: No skull fracture. Old mildly depressed nasal bone fractures. No significant scalp soft tissue swelling. SINUSES/ORBITS: Trace paranasal sinus mucosal thickening. Mastoid air cells are well aerated.The included ocular globes and orbital contents are non-suspicious. Status post bilateral ocular lens implants. OTHER: Patient is intubated. CT CERVICAL SPINE FINDINGS ALIGNMENT: Maintained lordosis. Vertebral bodies in alignment. SKULL BASE AND VERTEBRAE: Cervical vertebral bodies and posterior elements are intact. Status post C3 through C7 ACDF with restored disc height, intact well-seated hardware. No fracture. Moderate LEFT C4-5 facet arthropathy. C1-2 articulation with mild arthropathy. 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 Congenital canal narrowing. SOFT TISSUES AND SPINAL CANAL: Mild prevertebral soft tissue swelling consistent with recent surgery. Mild calcific atherosclerosis carotid bifurcations. Patient is intubated. DISC LEVELS: Moderate to severe C4-5, C5-6 neural foraminal narrowing. No osseous canal stenosis. UPPER CHEST: Lung apices are clear. OTHER: None. IMPRESSION: CT HEAD: 1. Stable negative noncontrast CT HEAD for age. CT CERVICAL SPINE: 1. Status post C3 through C7 ACDF with expected postoperative changes. 2. No fracture or malalignment. Electronically Signed   By: Elon Alas M.D.   On: 07/19/2018 18:35   Ct Cervical Spine Wo Contrast  Result Date: 07/19/2018 CLINICAL DATA:  Syncopal episode, suspect cardiac etiology. Status post ACDF yesterday. EXAM: CT HEAD WITHOUT CONTRAST CT CERVICAL SPINE WITHOUT CONTRAST TECHNIQUE: Multidetector CT imaging of the head and cervical spine was performed following the standard protocol without intravenous contrast. Multiplanar CT image reconstructions of the cervical spine were also generated.  COMPARISON:  CT angiogram head and neck July 09, 2018 and MRI cervical spine June 08, 2018. FINDINGS: CT HEAD FINDINGS BRAIN: No intraparenchymal hemorrhage, mass effect nor midline shift. The ventricles and sulci are normal for age. Patchy supratentorial white matter hypodensities within normal range for patient's age, though non-specific are most compatible with chronic small vessel ischemic disease. No acute large vascular territory infarcts. No abnormal extra-axial fluid collections. Basal cisterns are patent. VASCULAR: Mild calcific atherosclerosis of the carotid siphons. SKULL: No skull fracture. Old mildly depressed nasal bone fractures. No significant scalp soft tissue swelling. SINUSES/ORBITS: Trace paranasal sinus mucosal thickening. Mastoid air cells are well aerated.The included ocular globes and orbital contents are non-suspicious. Status post bilateral ocular lens implants. OTHER: Patient is intubated. CT CERVICAL SPINE FINDINGS ALIGNMENT: Maintained lordosis. Vertebral bodies in alignment. SKULL BASE AND VERTEBRAE: Cervical vertebral bodies and posterior elements are intact. Status post C3 through C7 ACDF with restored disc height, intact well-seated hardware. No fracture. Moderate LEFT C4-5 facet arthropathy. C1-2 articulation with mild arthropathy. 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 Congenital canal narrowing. SOFT TISSUES AND SPINAL CANAL: Mild prevertebral soft tissue swelling consistent with recent surgery. Mild calcific atherosclerosis carotid bifurcations. Patient is intubated. DISC LEVELS: Moderate to severe C4-5, C5-6 neural foraminal narrowing. No osseous canal stenosis. UPPER CHEST: Lung apices are clear. OTHER: None. IMPRESSION: CT HEAD: 1. Stable negative noncontrast CT HEAD for age. CT CERVICAL SPINE: 1. Status post C3 through C7 ACDF with expected postoperative changes. 2. No fracture or malalignment. Electronically Signed   By: Elon Alas  M.D.   On: 07/19/2018 18:35   Dg Chest Port 1 View  Result Date: 07/20/2018 CLINICAL DATA:  Check endotracheal tube placement EXAM: PORTABLE CHEST 1 VIEW COMPARISON:  07/19/18 FINDINGS: Left central venous line, nasogastric catheter and endotracheal tube are noted in satisfactory position. The overall inspiratory effort is again poor. Bibasilar atelectasis is noted and stable. No pneumothorax is seen. No bony abnormality is noted. Postsurgical changes are noted. Cardiac shadow is mildly enlarged. IMPRESSION: Tubes and lines in satisfactory position. Bibasilar atelectatic changes stable from the previous exam. Electronically Signed   By: Inez Catalina M.D.   On: 07/20/2018 07:38   Dg Chest Port 1 View  Result Date: 07/19/2018 CLINICAL DATA:  Line and tube placement EXAM: PORTABLE CHEST 1 VIEW COMPARISON:  None. FINDINGS: Endotracheal tube tip is at the level of the clavicular heads. Orogastric tube tip projects over the stomach. Gastric side port also projects over the stomach. Left IJ approach central venous catheter tip is at the cavoatrial junction. Remote CABG sequelae. Shallow lung inflation with left basilar atelectasis. Mild cardiomegaly. No focal airspace consolidation. IMPRESSION: Endotracheal tube tip at the level of the clavicular heads. Orogastric tube tip and side port project over the stomach. Left IJ approach CVC tip at the cavoatrial junction. Electronically Signed   By: Ulyses Jarred M.D.   On: 07/19/2018 19:52   Dg C-arm 1-60 Min  Result Date: 07/16/2018 CLINICAL DATA:  C3-7 ACDF. EXAM: DG CERVICAL SPINE - 1 VIEW; DG C-ARM 61-120 MIN COMPARISON:  Cervical MRI 07/09/2018. FINDINGS: A single lateral spot fluoroscopic image of  the cervical spine is submitted. Small Field-of-view does not allow accurate numbering of the cervical segments. There is an anterior plate and screws, the inferior components of which are not well visualized. No complications are identified. IMPRESSION: Lateral view  following cervical fusion. The inferior aspect of the hardware is incompletely visualized, and small field-of-view does not allow accurate numbering of the cervical segments. Electronically Signed   By: Richardean Sale M.D.   On: 07/16/2018 17:30   Dg C-arm 1-60 Min  Result Date: 07/16/2018 CLINICAL DATA:  C3-7 ACDF. EXAM: DG CERVICAL SPINE - 1 VIEW; DG C-ARM 61-120 MIN COMPARISON:  Cervical MRI 07/09/2018. FINDINGS: A single lateral spot fluoroscopic image of the cervical spine is submitted. Small Field-of-view does not allow accurate numbering of the cervical segments. There is an anterior plate and screws, the inferior components of which are not well visualized. No complications are identified. IMPRESSION: Lateral view following cervical fusion. The inferior aspect of the hardware is incompletely visualized, and small field-of-view does not allow accurate numbering of the cervical segments. Electronically Signed   By: Richardean Sale M.D.   On: 07/16/2018 17:30   Dg C-arm 1-60 Min  Result Date: 07/16/2018 CLINICAL DATA:  C3-7 ACDF. EXAM: DG CERVICAL SPINE - 1 VIEW; DG C-ARM 61-120 MIN COMPARISON:  Cervical MRI 07/09/2018. FINDINGS: A single lateral spot fluoroscopic image of the cervical spine is submitted. Small Field-of-view does not allow accurate numbering of the cervical segments. There is an anterior plate and screws, the inferior components of which are not well visualized. No complications are identified. IMPRESSION: Lateral view following cervical fusion. The inferior aspect of the hardware is incompletely visualized, and small field-of-view does not allow accurate numbering of the cervical segments. Electronically Signed   By: Richardean Sale M.D.   On: 07/16/2018 17:30    Assessment and Plan:   1. Cardiac arrest was asystole and CPR for 3 min.  On Vent. - cooling protocol followed by CCM.  Dr. Angelena Form has seen.   2. Troponin mildly elevated with pk at 0.30 now decreasing, which does not  appear to be primary cardiac event.  With medications now SB with 1st degree AV Block.   Will check echo. Last cath 02/2018 with patent grafts and non obstructive disease to non bypassed RCA.   Will follow along.  3. Hypotension on levophed  4. CAD with hx CABG 10/2016 and had been without chest pain prior to surgery and ambulating.   5. Ant. Cervical disc fusion per neuro. +dysphaia post op  6. HLD on crestor twice weekly due to mood swings  7. CKD-3 stable 8. Bradycardia with P wave possible high grade AV block vs Junctional alone.        For questions or updates, please contact [place] Please consult [email or web address] for contact info under     Signed, Cecilie Kicks, NP  07/20/2018 11:18 AM   I have personally seen and examined this patient with Cecilie Kicks, NP. I agree with the assessment and plan as outlined above. Mr. Mcmann is a 77 yo patient with h/o CAD s/p CABG, DM, HTN, HLD, OSA, chronic diastolic CHF and CKD who was here this week following an anterior cervical disc fusion with subsequent cardiopulmonary arrest. He had c/o difficulty swallowing and then was found unresponsive with asystole. Unclear how long he was down for. He is now intubated and on the cooling protocol.  Telemetry is personally reviewed and shows sinus bradycardia EKG reviewed by me and shows sinus brady,  1st degree AV block, poor R wave progression precordial leads, QT 589mec Labs reviewed by me. Minimal troponin bump with downward trend (peak 0.3 now down to 0.25)  My exam:  General: Well developed, well nourished, intubated, sedated  HEENT: tongue swelling noted SKIN: warm, dry. No rashes. Neuro: sedated/paralyzed-unable to assess Musculoskeletal: unable to assess Psychiatric: sedated Lungs:Mechanical BS bilaterally.  Cardiovascular: Regular and brady. No murmurs, gallops or rubs. Abdomen:Soft. Bowel sounds present.   Extremities: No lower extremity edema.   Plan: His asystolic cardiac arrest  seems to be driven by a respiratory process, ? Anaphylaxis with loss of airway. Subtle elevation in troponin with downward trend. His arrest does not appear to be driven by an acute cardiac process. Agree with arranging an echo to assess LV systolic fucntion. Continue supportive care. We will follow along with you.   CLauree Chandler9/20/2019 1:02 PM

## 2018-07-20 NOTE — Progress Notes (Addendum)
NAME:  Edward [name], MRN:  [id number], DOB:  05/31/1941, LOS: 7 ADMISSION DATE:  07/13/2018, CONSULTATION DATE: 9/19 REFERRING MD:  Roylene Reason, CHIEF COMPLAINT:  Cardiac arrest    Brief History   77 year old patient with history of diabetes with history of difficult glycemic control and cervical stenosis resulting in cord compression underwent anterior cervical disc fusion of cervical spine on September 16.  Managed without steroids due to concerns of glycemic control challenge.  On 9/19 began to notice difficulty swallowing, not able to even swallow his own saliva.  Had been ordered Decadron but this was not given.  He was found by nursing staff unresponsive and in asystole.  He underwent 1 round of ACLS with successful return of spontaneous circulation.  Significant Hospital Events   9/16: Anterior cervical disc fusion 3/53: Asystolic arrest.  Estimated time to return of spontaneous circulation approximately 3 minutes.  Unresponsive not following commands postarrest so hypothermia protocol initiated.  Goal temperature met at: 1030 pm  9/20: Hemodynamically stable on low-dose norepinephrine  Consults: date of consult/date signed off & final recs:  Care consulted 9/20 status post cardiac arrest Procedures (surgical and bedside):  9/16: Anterior cervical disc fusion by neurosurgery 9/19: Oral endotracheal tube 9/19: Left internal jugular vein triple-lumen catheter>>> Significant Diagnostic Tests:  9/19: CT brain noncontrasted: Negative CT cervical spine 9/19: Status post C3-C7 ACDF with expected postoperative changes.  No fracture or malalignment  Micro Data:   Antimicrobials:     Subjective:  Sedated and under neuromuscular blockade for hypothermia protocol Objective   Blood pressure (Abnormal) 148/59, pulse (Abnormal) 45, temperature (Abnormal) 90.9 F (32.7 C), resp. rate 11, height '5\' 8"'  (1.727 m), weight 122.1 kg, SpO2 100 %. CVP:  [3 mmHg-9 mmHg] 3 mmHg  Vent Mode: PRVC FiO2  (%):  [40 %-100 %] 40 % Set Rate:  [14 bmp] 14 bmp Vt Set:  [550 mL] 550 mL PEEP:  [5 cmH20] 5 cmH20 Plateau Pressure:  [19 cmH20-20 cmH20] 20 cmH20   Intake/Output Summary (Last 24 hours) at 07/20/2018 2992 Last data filed at 07/20/2018 0800 Gross per 24 hour  Intake 1848.82 ml  Output 1675 ml  Net 173.82 ml   Filed Weights   07/13/18 1130 07/19/18 1830 07/20/18 0445  Weight: 127 kg 127 kg 122.1 kg    Examination: General: Obese 77 year old African-American male he is currently sedated and paralyzed on neuromuscular blockade for hypothermia protocol HENT: Normocephalic atraumatic.  Tongue is dry.  Orally intubated.  Sclera nonicteric.  Anterior cervical surgical site clean dry and intact Lungs: Diminished bases equal chest rise on mechanically assisted breath Cardiovascular: Regular rate and rhythm Abdomen: Soft nontender Extremities: Warm dry brisk capillary refill Neuro: Sedated, currently GCS 3 on neuromuscular blockade GU: Clear yellow urine  Resolved Hospital Problem list    Assessment & Plan:  Status post asystolic cardiac arrest.  Presumed hypoxia mediated report of inadequate airway protection pre-arrest, swollen tongue so wonder about adverse reaction to medications??  Does have a history of coronary artery disease -Minimal troponin bump -Twelve-lead currently with prolonged QTC and first-degree heart block H/O coronary artery disease and CABG back in 2019 with chronic diastolic heart failure Plan Continue telemetry monitoring Mean arterial pressure goal greater than 80 Norepinephrine as needed Echocardiogram Will ask cardiology to see may need further ischemic evaluation if has neurological recovery Hold Inderal Continue Crestor  Ventilator dependence status post cardiopulmonary arrest H/o OSA  -Post intubation arterial blood gas reviewed -Notable chest x-ray personally reviewed from 9/20:Endotracheal  tubes in satisfactory position as is the left internal  jugular vein catheter.  He has cardiomegaly.  Low volume film.  There appears to be bibasilar atelectasis. Plan Full ventilator support Continuing IV Versed and fentanyl with neuromuscular blockade Transition to PAD protocol once rewarmed VAP bundle Follow-up ABG this a.m. A.m. chest x-ray Scheduled Decadron, given concern about airway compromise  Acute metabolic encephalopathy in setting of possible anoxic brain injury -Initial CT imaging negative Plan Continuing hypothermia protocol EEG Will consider neurology evaluation pending exam after patient rewarmed  Chronic kidney disease stage III Creatinine remains stable Plan Continue routine chemistry monitoring Strict intake and output Renal dose medications as appropriate  At risk for fluid and electrolyte balance Plan Trend chemistries, replace as indicated  Diabetes with widely fluctuating glucose, currently hyperglycemic Plan  Sliding scale insulin  Status post anterior cervical disc fusion 9/16 Plan Decadron as above Routine surgical site assessment   Disposition / Summary of Today's Plan 07/20/18   Continue hypothermia protocol.  Rewarming plan for 1030 this evening.  Start trickle feeds, get EEG.  Follow-up routine arterial blood gas.    Diet: N.p.o., start tube feeds 9/20 Pain/Anxiety/Delirium protocol (if indicated): Currently on hypothermia protocol will transition to PAD protocol after after hypothermia completed VAP protocol (if indicated) ordered 9/20 DVT prophylaxis: Starting low molecular weight heparin 9/20 GI prophylaxis: H2 blockade Hyperglycemia protocol: Hyperglycemia protocol Mobility: Bedrest Code Status: Full code Family Communication: Pending  Labs   CBC: Recent Labs  Lab 07/13/18 1238 07/16/18 0242 07/16/18 1818 07/19/18 1716 07/19/18 1901 07/19/18 2050 07/19/18 2305 07/20/18 0110  WBC 6.1 11.3* 10.8* 10.2  --   --   --   --   NEUTROABS 4.8  --   --  7.4  --   --   --   --   HGB  12.6* 12.9* 13.7 18.1* 13.9 13.9 13.9 13.6  HCT 39.9 40.7 43.4 57.3* 41.0 41.0 41.0 40.0  MCV 89.1 88.7 89.9 89.0  --   --   --   --   PLT 252 274 244 145*  --   --   --   --    Basic Metabolic Panel: Recent Labs  Lab 07/13/18 1238 07/16/18 0242 07/18/18 0351 07/19/18 1716 07/19/18 1901 07/19/18 2050 07/19/18 2305 07/20/18 0110 07/20/18 0402  NA 135 140 137 137 135 136 137 137 137  K 4.5 4.1 4.1 4.0 4.2 4.0 3.9 3.6 3.9  CL 100 103 98 95* 95* 96* 97* 98 101  CO2 '26 30 30 26  ' --   --   --   --  27  GLUCOSE 245* 111* 150* 318* 350* 348* 316* 280* 205*  BUN '18 22 11 15 19 18 16 15 13  ' CREATININE 1.25* 1.22 0.98 1.30* 1.10 0.90 0.80 0.80 0.86  CALCIUM 8.5* 9.0 8.5* 8.6*  --   --   --   --  8.1*  MG  --   --   --  1.8  --   --   --   --   --    GFR: Estimated Creatinine Clearance: 92.9 mL/min (by C-G formula based on SCr of 0.86 mg/dL). Recent Labs  Lab 07/13/18 1238 07/16/18 0242 07/16/18 1818 07/19/18 1716  WBC 6.1 11.3* 10.8* 10.2  LATICACIDVEN  --   --   --  5.7*   Liver Function Tests: Recent Labs  Lab 07/13/18 1238 07/19/18 1716  AST 20 135*  ALT 15 105*  ALKPHOS 88 89  BILITOT 0.7 1.0  PROT 5.9* 6.6  ALBUMIN 3.1* 3.2*   No results for input(s): LIPASE, AMYLASE in the last 168 hours. No results for input(s): AMMONIA in the last 168 hours. ABG    Component Value Date/Time   PHART 7.368 07/19/2018 2033   PCO2ART 52.7 (H) 07/19/2018 2033   PO2ART 466.0 (H) 07/19/2018 2033   HCO3 31.0 (H) 07/19/2018 2033   TCO2 28 07/20/2018 0110   ACIDBASEDEF 2.0 11/09/2016 2003   O2SAT 100.0 07/19/2018 2033    Coagulation Profile: Recent Labs  Lab 07/19/18 1858 07/20/18 0312  INR 1.16 1.20   Cardiac Enzymes: Recent Labs  Lab 07/19/18 1716 07/19/18 2314 07/20/18 0402  TROPONINI <0.03 0.30* 0.25*   HbA1C: Hgb A1c MFr Bld  Date/Time Value Ref Range Status  06/19/2018 10:56 AM 9.5 (H) 4.6 - 6.5 % Final    Comment:    Glycemic Control Guidelines for People  with Diabetes:Non Diabetic:  <6%Goal of Therapy: <7%Additional Action Suggested:  >8%   03/13/2018 10:53 AM 10.2 (H) 4.6 - 6.5 % Final    Comment:    Glycemic Control Guidelines for People with Diabetes:Non Diabetic:  <6%Goal of Therapy: <7%Additional Action Suggested:  >8%    CBG: Recent Labs  Lab 07/20/18 0404 07/20/18 0511 07/20/18 0558 07/20/18 0702 07/20/18 0805  GLUCAP [place] ACNP-BC [place] Pager # [phone] OR # [phone] if no answer  35 minutes ccm time

## 2018-07-20 NOTE — Progress Notes (Signed)
[place] [place] notified of K 3.6. Awaiting replacement orders.

## 2018-07-20 NOTE — Procedures (Signed)
Date of recording 07/20/2018  Referring physician Aljishi  Reason for the study Altered mental status, history of cardiac arrest  Technical Digital EEG recording using 10-20 international electrode system  Description of the recording Significant posterior dominant rhythm seen. EEG comprised of generalized intermittent rhythmic delta slowing. Deep architecture was not seen Epileptiform features were not seen.   Impression The EEG isabnormalare findings are suggestive of moderate to severe generalized cerebral dysfunction.Epileptiform features were not seen during this recording.

## 2018-07-20 NOTE — Progress Notes (Addendum)
Rewarming phase started at rate of 0.3 *C /hr, target temperature 37 *C. Witnessed by Casilda Carls, RN

## 2018-07-20 NOTE — Progress Notes (Signed)
OT Cancellation Note  Patient Details Name: Edward [name] MRN: [id number] DOB: [birth year]   Cancelled Treatment:    Reason Eval/Treat Not Completed: Medical issues which prohibited therapy(Pt intubated, not appropriate for therapy) Will follow.  [name] 07/20/2018, 1:14 PM

## 2018-07-20 NOTE — Progress Notes (Signed)
Neurosurgery Service Progress Note  Subjective: No further events overnight after code and transfer  Objective: Vitals:   07/20/18 0630 07/20/18 0645 07/20/18 0700 07/20/18 0715  BP:   (!) 137/57   Pulse: (!) 44 (!) 46 (!) 44 (!) 44  Resp: (!) '7 16 14 14  ' Temp:   (!) 90.9 F (32.7 C)   TempSrc:      SpO2: 100% 100% 100% 100%  Weight:      Height:       Temp (24hrs), Avg:93.6 F (34.2 C), Min:89.8 F (32.1 C), Max:98.8 F (37.1 C)  CBC Latest Ref Rng & Units 07/20/2018 07/19/2018 07/19/2018  WBC 4.0 - 10.5 K/uL - - -  Hemoglobin 13.0 - 17.0 g/dL 13.6 13.9 13.9  Hematocrit 39.0 - 52.0 % 40.0 41.0 41.0  Platelets 150 - 400 K/uL - - -   BMP Latest Ref Rng & Units 07/20/2018 07/20/2018 07/19/2018  Glucose 70 - 99 mg/dL 205(H) 280(H) 316(H)  BUN 8 - 23 mg/dL '13 15 16  ' Creatinine 0.61 - 1.24 mg/dL 0.86 0.80 0.80  BUN/Creat Ratio 10 - 24 - - -  Sodium 135 - 145 mmol/L 137 137 137  Potassium 3.5 - 5.1 mmol/L 3.9 3.6 3.9  Chloride 98 - 111 mmol/L 101 98 97(L)  CO2 22 - 32 mmol/L 27 - -  Calcium 8.9 - 10.3 mg/dL 8.1(L) - -    Intake/Output Summary (Last 24 hours) at 07/20/2018 0809 Last data filed at 07/20/2018 0700 Gross per 24 hour  Intake 1848.82 ml  Output 1475 ml  Net 373.82 ml    Current Facility-Administered Medications:  .  0.9 %  sodium chloride infusion, , Intravenous, Continuous, Aljishi, Virgina Norfolk, MD, Last Rate: 100 mL/hr at 07/20/18 0700 .  amLODipine (NORVASC) tablet 10 mg, 10 mg, Oral, Daily, Judith Part, MD, 10 mg at 07/19/18 0907 .  artificial tears (LACRILUBE) ophthalmic ointment 1 application, 1 application, Both Eyes, Q8H, Aljishi, Virgina Norfolk, MD, 1 application at 95/18/84 0531 .  bismuth subsalicylate (PEPTO BISMOL) 262 MG/15ML suspension 30 mL, 30 mL, Oral, BID PRN, Judith Part, MD .  chlorhexidine gluconate (MEDLINE KIT) (PERIDEX) 0.12 % solution 15 mL, 15 mL, Mouth Rinse, BID, Aljishi, Virgina Norfolk, MD, 15 mL at 07/20/18 0800 .   chlorpheniramine-HYDROcodone (TUSSIONEX) 10-8 MG/5ML suspension 5 mL, 5 mL, Oral, Q6H, Shahmehdi, Seyed A, MD, 5 mL at 07/19/18 0625 .  [COMPLETED] cisatracurium (NIMBEX) bolus via infusion 12.7 mg, 0.1 mg/kg, Intravenous, Once, 12.7 mg at 07/19/18 1845 **AND** cisatracurium (NIMBEX) 200 mg in sodium chloride 0.9 % 200 mL (1 mg/mL) infusion, 1-1.5 mcg/kg/min, Intravenous, Continuous, Last Rate: 11.43 mL/hr at 07/20/18 0700, 1.5 mcg/kg/min at 07/20/18 0700 **AND** cisatracurium (NIMBEX) bolus via infusion 6.4 mg, 0.05 mg/kg, Intravenous, PRN, Aljishi, Wael Z, MD .  cyclobenzaprine (FLEXERIL) tablet 5 mg, 5 mg, Oral, QHS PRN, Judith Part, MD, 5 mg at 07/19/18 1506 .  dexamethasone (DECADRON) injection 2 mg, 2 mg, Intravenous, Q8H, Primitivo Merkey A, MD, 2 mg at 07/20/18 0711 .  diclofenac sodium (VOLTAREN) 1 % transdermal gel 2 g, 2 g, Topical, QID PRN, Judith Part, MD .  dicyclomine (BENTYL) 10 MG/5ML syrup 10 mg, 10 mg, Per Tube, BID, Aljishi, Wael Z, MD .  erythromycin ophthalmic ointment 1 application, 1 application, Both Eyes, Daily PRN, Judith Part, MD .  famotidine (PEPCID) IVPB 20 mg premix, 20 mg, Intravenous, Q12H, Aldean Jewett, MD, Stopped at 07/19/18 2256 .  fentaNYL (SUBLIMAZE) bolus  via infusion 25 mcg, 25 mcg, Intravenous, Q30 min PRN, Aldean Jewett, MD, 25 mcg at 07/19/18 1938 .  fentaNYL (SUBLIMAZE) injection 50 mcg, 50 mcg, Intravenous, Once, Aljishi, Wael Z, MD .  fentaNYL 2512mg in NS 2555m(1078mml) infusion-PREMIX, 100-300 mcg/hr, Intravenous, Continuous, Aljishi, WaeVirgina NorfolkD, Last Rate: 25 mL/hr at 07/20/18 0700, 250 mcg/hr at 07/20/18 0700 .  furosemide (LASIX) tablet 60 mg, 60 mg, Oral, Daily, OstJudith PartD, 60 mg at 07/19/18 0908 .  gabapentin (NEURONTIN) 250 MG/5ML solution 300 mg, 300 mg, Per Tube, Q12H, Aljishi, Wael Z, MD .  hydrALAZINE (APRESOLINE) tablet 25 mg, 25 mg, Oral, Q8H, Irean Kendricks A, MD, 25 mg at 07/19/18 1505 .   HYDROmorphone (DILAUDID) injection 0.5 mg, 0.5 mg, Intravenous, Q3H PRN, OstJudith PartD, 0.5 mg at 07/19/18 1506 .  insulin regular (NOVOLIN R,HUMULIN R) 100 Units in sodium chloride 0.9 % 100 mL (1 Units/mL) infusion, , Intravenous, Continuous, Simpson, Paula B, NP, Last Rate: 7.4 mL/hr at 07/20/18 0704, 7.4 Units/hr at 07/20/18 0704 .  lactated ringers infusion, , Intravenous, Continuous, GreBelinda BlockD, Last Rate: 10 mL/hr at 07/16/18 0854 .  meclizine (ANTIVERT) tablet 25 mg, 25 mg, Oral, BID PRN, OstJudith PartD .  MEDLINE mouth rinse, 15 mL, Mouth Rinse, 10 times per day, AljAldean JewettD, 15 mL at 07/20/18 0532 .  midazolam (VERSED) 50 mg in sodium chloride 0.9 % 50 mL (1 mg/mL) infusion, 2-10 mg/hr, Intravenous, Continuous, Aljishi, WaeVirgina NorfolkD, Last Rate: 5 mL/hr at 07/20/18 0700, 5 mg/hr at 07/20/18 0700 .  midazolam (VERSED) bolus via infusion 1 mg, 1 mg, Intravenous, Q30 min PRN, Aljishi, WaeVirgina NorfolkD .  midazolam (VERSED) injection 1 mg, 1 mg, Intravenous, Once, Aljishi, WaeVirgina NorfolkD .  nitroGLYCERIN (NITROSTAT) SL tablet 0.4-0.8 mg, 0.4-0.8 mg, Sublingual, Q5 Min x 3 PRN, Emmanuella Mirante A, MD .  norepinephrine (LEVOPHED) 4mg86m D5W 250mL19mmix infusion, 0-50 mcg/min, Intravenous, Titrated, Aljishi, Wael Virgina Norfolk Last Rate: 30 mL/hr at 07/20/18 0802, 8 mcg/min at 07/20/18 0802 .  oxyCODONE-acetaminophen (PERCOCET/ROXICET) 5-325 MG per tablet 1-2 tablet, 1-2 tablet, Oral, Q4H PRN, OsterJudith Part 2 tablet at 07/18/18 2157 .  pantoprazole (PROTONIX) EC tablet 40 mg, 40 mg, Oral, Daily, OsterJudith Part 40 mg at 07/19/18 0908 .  polyethylene glycol (MIRALAX / GLYCOLAX) packet 17 g, 17 g, Oral, Daily, OsterJudith Part 17 g at 07/19/18 0906 .  polyvinyl alcohol (LIQUIFILM TEARS) 1.4 % ophthalmic solution 1 drop, 1 drop, Both Eyes, TID, Aljishi, Wael Z, MD .  prednisoLONE acetate (PRED FORTE) 1 % ophthalmic suspension 2 drop, 2 drop, Both Eyes, TID  AC & HS, Suan Pyeatt, ThomaJoyice Faster 2 drop at 07/20/18 0741 .  primidone (MYSOLINE) tablet 50 mg, 50 mg, Oral, BID, OsterJudith Part 50 mg at 07/19/18 0906 .  propranolol (INDERAL) tablet 40 mg, 40 mg, Oral, TID, OsterJudith Part 40 mg at 07/19/18 0907 .  rosuvastatin (CRESTOR) tablet 20 mg, 20 mg, Oral, Once per day on Tue Thu, Benard Minturn A, MD, 20 mg at 07/17/18 2244 .  senna-docusate (Senokot-S) tablet 2 tablet, 2 tablet, Oral, BID, OsterJudith Part 2 tablet at 07/19/18 0908   Physical Exam: Intubated, sedated, on nimbex gtt  Assessment & Plan: 77 y.27 man s/p 4 level ACDF, recovering well immediately post-op with resolution of radicular pain and weakness, 9/19 code for asystole of unclear cause, CT  C-sp no hematoma, CTH neg. -on post-arrest cooling protocol, exam limited, will continue to follow, maintaining pressures on single pressor -defer prognostication after rewarming to critical care team. If needed, I also trained as a neurointensivist and can assist in that process, but will defer to CCM's normal procedure for this process. -okay for DVT chemoprophylaxis with unfractionated SQH or enoxaparin from a neurosurgical perspective.  -trops downtrending. Given reassuring CT C-spine and patient's multiple risk factors, he is now POD4 and has an ETT and OGT in place that will likely be in place for at least 72 more hours. Okay from a neurosurgical perspective with restarting ASA81 if CCM/cardiology think it would provide benefit -remainder of care per critical care team  Judith Part  07/20/18 8:09 AM

## 2018-07-21 ENCOUNTER — Inpatient Hospital Stay (HOSPITAL_COMMUNITY): Payer: Medicare Other

## 2018-07-21 DIAGNOSIS — I25709 Atherosclerosis of coronary artery bypass graft(s), unspecified, with unspecified angina pectoris: Secondary | ICD-10-CM

## 2018-07-21 LAB — BASIC METABOLIC PANEL
ANION GAP: 7 (ref 5–15)
ANION GAP: 7 (ref 5–15)
ANION GAP: 8 (ref 5–15)
ANION GAP: 8 (ref 5–15)
ANION GAP: 9 (ref 5–15)
Anion gap: 9 (ref 5–15)
BUN: 12 mg/dL (ref 8–23)
BUN: 14 mg/dL (ref 8–23)
BUN: 15 mg/dL (ref 8–23)
BUN: 17 mg/dL (ref 8–23)
BUN: 21 mg/dL (ref 8–23)
BUN: 24 mg/dL — AB (ref 8–23)
CALCIUM: 7.9 mg/dL — AB (ref 8.9–10.3)
CALCIUM: 7.8 mg/dL — AB (ref 8.9–10.3)
CALCIUM: 7.8 mg/dL — AB (ref 8.9–10.3)
CALCIUM: 7.8 mg/dL — AB (ref 8.9–10.3)
CO2: 26 mmol/L (ref 22–32)
CO2: 26 mmol/L (ref 22–32)
CO2: 26 mmol/L (ref 22–32)
CO2: 26 mmol/L (ref 22–32)
CO2: 27 mmol/L (ref 22–32)
CO2: 27 mmol/L (ref 22–32)
CREATININE: 0.98 mg/dL (ref 0.61–1.24)
CREATININE: 1.3 mg/dL — AB (ref 0.61–1.24)
Calcium: 8 mg/dL — ABNORMAL LOW (ref 8.9–10.3)
Calcium: 7.9 mg/dL — ABNORMAL LOW (ref 8.9–10.3)
Chloride: 107 mmol/L (ref 98–111)
Chloride: 106 mmol/L (ref 98–111)
Chloride: 104 mmol/L (ref 98–111)
Chloride: 106 mmol/L (ref 98–111)
Chloride: 107 mmol/L (ref 98–111)
Chloride: 107 mmol/L (ref 98–111)
Creatinine, Ser: 0.76 mg/dL (ref 0.61–1.24)
Creatinine, Ser: 0.87 mg/dL (ref 0.61–1.24)
Creatinine, Ser: 1.1 mg/dL (ref 0.61–1.24)
Creatinine, Ser: 1.29 mg/dL — ABNORMAL HIGH (ref 0.61–1.24)
GFR calc Af Amer: >60 mL/min (ref >60)
GFR calc non Af Amer: >60 mL/min (ref >60)
GFR calc non Af Amer: 52 mL/min — ABNORMAL LOW (ref >60)
GFR, EST AFRICAN AMERICAN: 60 mL/min — AB (ref >60)
GFR, EST NON AFRICAN AMERICAN: 52 mL/min — AB (ref >60)
GLUCOSE: 169 mg/dL — AB (ref 70–99)
GLUCOSE: 154 mg/dL — AB (ref 70–99)
Glucose, Bld: 205 mg/dL — ABNORMAL HIGH (ref 70–99)
Glucose, Bld: 217 mg/dL — ABNORMAL HIGH (ref 70–99)
Glucose, Bld: 174 mg/dL — ABNORMAL HIGH (ref 70–99)
Glucose, Bld: 125 mg/dL — ABNORMAL HIGH (ref 70–99)
POTASSIUM: 4 mmol/L (ref 3.5–5.1)
POTASSIUM: 4 mmol/L (ref 3.5–5.1)
Potassium: 3.5 mmol/L (ref 3.5–5.1)
Potassium: 3.5 mmol/L (ref 3.5–5.1)
Potassium: 3.5 mmol/L (ref 3.5–5.1)
Potassium: 3.9 mmol/L (ref 3.5–5.1)
SODIUM: 140 mmol/L (ref 135–145)
SODIUM: 142 mmol/L (ref 135–145)
Sodium: 140 mmol/L (ref 135–145)
Sodium: 139 mmol/L (ref 135–145)
Sodium: 141 mmol/L (ref 135–145)
Sodium: 141 mmol/L (ref 135–145)

## 2018-07-21 LAB — CBC
HCT: 41.2 % (ref 39.0–52.0)
HEMOGLOBIN: 13.3 g/dL (ref 13.0–17.0)
MCH: 27.9 pg (ref 26.0–34.0)
MCHC: 32.3 g/dL (ref 30.0–36.0)
MCV: 86.4 fL (ref 78.0–100.0)
Platelets: 259 10*3/uL (ref 150–400)
RBC: 4.77 MIL/uL (ref 4.22–5.81)
RDW: 12.5 % (ref 11.5–15.5)
WBC: 14.6 10*3/uL — ABNORMAL HIGH (ref 4.0–10.5)

## 2018-07-21 LAB — GLUCOSE, CAPILLARY
GLUCOSE-CAPILLARY: 192 mg/dL — AB (ref 70–99)
GLUCOSE-CAPILLARY: 163 mg/dL — AB (ref 70–99)
GLUCOSE-CAPILLARY: 133 mg/dL — AB (ref 70–99)
GLUCOSE-CAPILLARY: 187 mg/dL — AB (ref 70–99)
GLUCOSE-CAPILLARY: 134 mg/dL — AB (ref 70–99)
GLUCOSE-CAPILLARY: 121 mg/dL — AB (ref 70–99)
GLUCOSE-CAPILLARY: 138 mg/dL — AB (ref 70–99)
GLUCOSE-CAPILLARY: 158 mg/dL — AB (ref 70–99)
Glucose-Capillary: 117 mg/dL — ABNORMAL HIGH (ref 70–99)
Glucose-Capillary: 158 mg/dL — ABNORMAL HIGH (ref 70–99)
Glucose-Capillary: 161 mg/dL — ABNORMAL HIGH (ref 70–99)
Glucose-Capillary: 164 mg/dL — ABNORMAL HIGH (ref 70–99)
Glucose-Capillary: 167 mg/dL — ABNORMAL HIGH (ref 70–99)
Glucose-Capillary: 169 mg/dL — ABNORMAL HIGH (ref 70–99)
Glucose-Capillary: 175 mg/dL — ABNORMAL HIGH (ref 70–99)
Glucose-Capillary: 180 mg/dL — ABNORMAL HIGH (ref 70–99)
Glucose-Capillary: 181 mg/dL — ABNORMAL HIGH (ref 70–99)
Glucose-Capillary: 192 mg/dL — ABNORMAL HIGH (ref 70–99)
Glucose-Capillary: 198 mg/dL — ABNORMAL HIGH (ref 70–99)
Glucose-Capillary: 213 mg/dL — ABNORMAL HIGH (ref 70–99)

## 2018-07-21 LAB — PHOSPHORUS
PHOSPHORUS: 2.6 mg/dL (ref 2.5–4.6)
Phosphorus: 3.5 mg/dL (ref 2.5–4.6)

## 2018-07-21 LAB — MAGNESIUM
MAGNESIUM: 2 mg/dL (ref 1.7–2.4)
Magnesium: 2 mg/dL (ref 1.7–2.4)

## 2018-07-21 MED ORDER — INSULIN DETEMIR 100 UNIT/ML ~~LOC~~ SOLN
30.0000 [IU] | Freq: Every day | SUBCUTANEOUS | Status: DC
  Administered 2018-07-21 – 2018-07-24 (×4): 30 [IU] via SUBCUTANEOUS
  Filled 2018-07-21 (×5): qty 0.3

## 2018-07-21 NOTE — Progress Notes (Signed)
Progress Note  Patient Name: Edward [name] Date of Encounter: 07/21/2018  Primary Cardiologist: Sinclair Grooms, MD   Subjective   Intubated and sedated  Inpatient Medications    Scheduled Meds: . artificial tears  1 application Both Eyes P5F  . chlorhexidine gluconate (MEDLINE KIT)  15 mL Mouth Rinse BID  . dexamethasone  2 mg Intravenous Q8H  . dicyclomine  10 mg Per Tube BID  . enoxaparin (LOVENOX) injection  40 mg Subcutaneous Q24H  . feeding supplement (PRO-STAT SUGAR FREE 64)  30 mL Per Tube BID  . feeding supplement (VITAL HIGH PROTEIN)  1,000 mL Per Tube Q24H  . fentaNYL (SUBLIMAZE) injection  50 mcg Intravenous Once  . gabapentin  300 mg Per Tube Q12H  . mouth rinse  15 mL Mouth Rinse 10 times per day  . midazolam  1 mg Intravenous Once  . polyethylene glycol  17 g Oral Daily  . polyvinyl alcohol  1 drop Both Eyes TID  . prednisoLONE acetate  2 drop Both Eyes TID AC & HS  . primidone  50 mg Per Tube BID  . [START ON 07/24/2018] rosuvastatin  20 mg Per Tube Once per day on Tue Thu  . senna-docusate  2 tablet Oral BID   Continuous Infusions: . sodium chloride 100 mL/hr at 07/21/18 0700  . cisatracurium (NIMBEX) infusion 1.496 mcg/kg/min (07/21/18 0700)  . famotidine (PEPCID) IV Stopped (07/20/18 2219)  . fentaNYL infusion INTRAVENOUS 300 mcg/hr (07/21/18 0747)  . insulin (NOVOLIN-R) infusion 10.5 mL/hr at 07/21/18 0700  . lactated ringers 10 mL/hr at 07/20/18 1500  . midazolam (VERSED) infusion 5 mg/hr (07/21/18 0700)  . norepinephrine (LEVOPHED) Adult infusion 14 mcg/min (07/21/18 0746)   PRN Meds: [COMPLETED] cisatracurium **AND** cisatracurium (NIMBEX) infusion **AND** cisatracurium, erythromycin, fentaNYL, HYDROmorphone (DILAUDID) injection, midazolam   Vital Signs    Vitals:   07/21/18 0630 07/21/18 0700 07/21/18 0747 07/21/18 0800  BP:  (!) 147/46 (!) 147/46 (!) 167/62  Pulse: 72 74 74 75  Resp: '14 14 14 14  ' Temp:  (!) 95.7 F (35.4 C)  (!) 96.3  F (35.7 C)  TempSrc:    Bladder  SpO2: 99% 99% 100% 100%  Weight:      Height:        Intake/Output Summary (Last 24 hours) at 07/21/2018 0851 Last data filed at 07/21/2018 0800 Gross per 24 hour  Intake 5621.86 ml  Output 1389 ml  Net 4232.86 ml   Filed Weights   07/19/18 1830 07/20/18 0445 07/21/18 0430  Weight: 127 kg 122.1 kg 127.5 kg    Telemetry    NSR - Personally Reviewed  ECG    Marked sinus brady. Old septal infarct. Prolonged QTc- on cooling protocol - Personally Reviewed  Physical Exam   GEN: No acute distress.  Intubated. Obese. Neck: No JVD, tongue swollen Cardiac: RRR, no murmurs, rubs, or gallops.  Respiratory: Clear to auscultation bilaterally. GI: Soft, nontender, non-distended  MS: No edema; No deformity. Neuro:  Nonfocal paralyzed and sedated.   Labs    Chemistry Recent Labs  Lab 07/19/18 1716  07/20/18 2058 07/20/18 2359 07/21/18 0407  NA 137   < > 139 140 140  K 4.0   < > 3.8 3.5 3.5  CL 95*   < > 105 107 106  CO2 26   < > '25 26 27  ' GLUCOSE 318*   < > 261* 205* 217*  BUN 15   < > 13 12 14  CREATININE 1.30*   < > 0.79 0.76 0.87  CALCIUM 8.6*   < > 8.0* 8.0* 7.9*  PROT 6.6  --   --   --   --   ALBUMIN 3.2*  --   --   --   --   AST 135*  --   --   --   --   ALT 105*  --   --   --   --   ALKPHOS 89  --   --   --   --   BILITOT 1.0  --   --   --   --   GFRNONAA 52*   < > >60 >60 >60  GFRAA 60*   < > >60 >60 >60  ANIONGAP 16*   < > '9 7 7   ' < > = values in this interval not displayed.     Hematology Recent Labs  Lab 07/16/18 1818 07/19/18 1716  07/19/18 2305 07/20/18 0110 07/21/18 0407  WBC 10.8* 10.2  --   --   --  14.6*  RBC 4.83 6.44*  --   --   --  4.77  HGB 13.7 18.1*   < > 13.9 13.6 13.3  HCT 43.4 57.3*   < > 41.0 40.0 41.2  MCV 89.9 89.0  --   --   --  86.4  MCH 28.4 28.1  --   --   --  27.9  MCHC 31.6 31.6  --   --   --  32.3  RDW 13.4 13.1  --   --   --  12.5  PLT 244 145*  --   --   --  259   < > = values in  this interval not displayed.    Cardiac Enzymes Recent Labs  Lab 07/19/18 1716 07/19/18 2314 07/20/18 0402 07/20/18 1119  TROPONINI <0.03 0.30* 0.25* 0.16*   No results for input(s): TROPIPOC in the last 168 hours.   BNPNo results for input(s): BNP, PROBNP in the last 168 hours.   DDimer No results for input(s): DDIMER in the last 168 hours.   Radiology    Ct Head Wo Contrast  Result Date: 07/19/2018 CLINICAL DATA:  Syncopal episode, suspect cardiac etiology. Status post ACDF yesterday. EXAM: CT HEAD WITHOUT CONTRAST CT CERVICAL SPINE WITHOUT CONTRAST TECHNIQUE: Multidetector CT imaging of the head and cervical spine was performed following the standard protocol without intravenous contrast. Multiplanar CT image reconstructions of the cervical spine were also generated. COMPARISON:  CT angiogram head and neck July 09, 2018 and MRI cervical spine June 08, 2018. FINDINGS: CT HEAD FINDINGS BRAIN: No intraparenchymal hemorrhage, mass effect nor midline shift. The ventricles and sulci are normal for age. Patchy supratentorial white matter hypodensities within normal range for patient's age, though non-specific are most compatible with chronic small vessel ischemic disease. No acute large vascular territory infarcts. No abnormal extra-axial fluid collections. Basal cisterns are patent. VASCULAR: Mild calcific atherosclerosis of the carotid siphons. SKULL: No skull fracture. Old mildly depressed nasal bone fractures. No significant scalp soft tissue swelling. SINUSES/ORBITS: Trace paranasal sinus mucosal thickening. Mastoid air cells are well aerated.The included ocular globes and orbital contents are non-suspicious. Status post bilateral ocular lens implants. OTHER: Patient is intubated. CT CERVICAL SPINE FINDINGS ALIGNMENT: Maintained lordosis. Vertebral bodies in alignment. SKULL BASE AND VERTEBRAE: Cervical vertebral bodies and posterior elements are intact. Status post C3 through C7 ACDF with  restored disc height, intact well-seated hardware. No fracture. Moderate LEFT C4-5  facet arthropathy. C1-2 articulation with mild arthropathy. 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 Congenital canal narrowing. SOFT TISSUES AND SPINAL CANAL: Mild prevertebral soft tissue swelling consistent with recent surgery. Mild calcific atherosclerosis carotid bifurcations. Patient is intubated. DISC LEVELS: Moderate to severe C4-5, C5-6 neural foraminal narrowing. No osseous canal stenosis. UPPER CHEST: Lung apices are clear. OTHER: None. IMPRESSION: CT HEAD: 1. Stable negative noncontrast CT HEAD for age. CT CERVICAL SPINE: 1. Status post C3 through C7 ACDF with expected postoperative changes. 2. No fracture or malalignment. Electronically Signed   By: Elon Alas M.D.   On: 07/19/2018 18:35   Ct Cervical Spine Wo Contrast  Result Date: 07/19/2018 CLINICAL DATA:  Syncopal episode, suspect cardiac etiology. Status post ACDF yesterday. EXAM: CT HEAD WITHOUT CONTRAST CT CERVICAL SPINE WITHOUT CONTRAST TECHNIQUE: Multidetector CT imaging of the head and cervical spine was performed following the standard protocol without intravenous contrast. Multiplanar CT image reconstructions of the cervical spine were also generated. COMPARISON:  CT angiogram head and neck July 09, 2018 and MRI cervical spine June 08, 2018. FINDINGS: CT HEAD FINDINGS BRAIN: No intraparenchymal hemorrhage, mass effect nor midline shift. The ventricles and sulci are normal for age. Patchy supratentorial white matter hypodensities within normal range for patient's age, though non-specific are most compatible with chronic small vessel ischemic disease. No acute large vascular territory infarcts. No abnormal extra-axial fluid collections. Basal cisterns are patent. VASCULAR: Mild calcific atherosclerosis of the carotid siphons. SKULL: No skull fracture. Old mildly depressed nasal bone fractures. No significant scalp  soft tissue swelling. SINUSES/ORBITS: Trace paranasal sinus mucosal thickening. Mastoid air cells are well aerated.The included ocular globes and orbital contents are non-suspicious. Status post bilateral ocular lens implants. OTHER: Patient is intubated. CT CERVICAL SPINE FINDINGS ALIGNMENT: Maintained lordosis. Vertebral bodies in alignment. SKULL BASE AND VERTEBRAE: Cervical vertebral bodies and posterior elements are intact. Status post C3 through C7 ACDF with restored disc height, intact well-seated hardware. No fracture. Moderate LEFT C4-5 facet arthropathy. C1-2 articulation with mild arthropathy. 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 Congenital canal narrowing. SOFT TISSUES AND SPINAL CANAL: Mild prevertebral soft tissue swelling consistent with recent surgery. Mild calcific atherosclerosis carotid bifurcations. Patient is intubated. DISC LEVELS: Moderate to severe C4-5, C5-6 neural foraminal narrowing. No osseous canal stenosis. UPPER CHEST: Lung apices are clear. OTHER: None. IMPRESSION: CT HEAD: 1. Stable negative noncontrast CT HEAD for age. CT CERVICAL SPINE: 1. Status post C3 through C7 ACDF with expected postoperative changes. 2. No fracture or malalignment. Electronically Signed   By: Elon Alas M.D.   On: 07/19/2018 18:35   Dg Chest Port 1 View  Result Date: 07/21/2018 CLINICAL DATA:  Acute respiratory failure. EXAM: PORTABLE CHEST 1 VIEW COMPARISON:  07/20/2018 and 07/19/2018 and 03/24/2018 FINDINGS: Endotracheal tube tip is approximately 18 mm above the carina. Central line tip is in the cavoatrial junction, 5.0 cm below the carina. NG tube tip is below the diaphragm. Heart size and vascularity are normal. Slight bibasilar atelectasis, essentially unchanged. Pulmonary vascularity is normal. No acute bone abnormality. IMPRESSION: No significant change since the prior study. Slight bibasilar atelectasis, left greater than right. Electronically Signed   By:  Lorriane Shire M.D.   On: 07/21/2018 08:23   Dg Chest Port 1 View  Result Date: 07/20/2018 CLINICAL DATA:  Check endotracheal tube placement EXAM: PORTABLE CHEST 1 VIEW COMPARISON:  07/19/18 FINDINGS: Left central venous line, nasogastric catheter and endotracheal tube are noted in satisfactory position.  The overall inspiratory effort is again poor. Bibasilar atelectasis is noted and stable. No pneumothorax is seen. No bony abnormality is noted. Postsurgical changes are noted. Cardiac shadow is mildly enlarged. IMPRESSION: Tubes and lines in satisfactory position. Bibasilar atelectatic changes stable from the previous exam. Electronically Signed   By: Inez Catalina M.D.   On: 07/20/2018 07:38   Dg Chest Port 1 View  Result Date: 07/19/2018 CLINICAL DATA:  Line and tube placement EXAM: PORTABLE CHEST 1 VIEW COMPARISON:  None. FINDINGS: Endotracheal tube tip is at the level of the clavicular heads. Orogastric tube tip projects over the stomach. Gastric side port also projects over the stomach. Left IJ approach central venous catheter tip is at the cavoatrial junction. Remote CABG sequelae. Shallow lung inflation with left basilar atelectasis. Mild cardiomegaly. No focal airspace consolidation. IMPRESSION: Endotracheal tube tip at the level of the clavicular heads. Orogastric tube tip and side port project over the stomach. Left IJ approach CVC tip at the cavoatrial junction. Electronically Signed   By: Ulyses Jarred M.D.   On: 07/19/2018 19:52    Cardiac Studies   Cath 02/2018   Difficult procedure from left radial approach due to difficulty with torque control. Would advise femoral approach in the future.  Eccentric 60% proximal LAD. Widely patent mid LAD stent. Antegrade flow to the apex.  Widely patent left main.  Totally occluded ostial circumflex to proximal circumflex.  Proximal mid and distal RCA moderate disease with tandem 65 to 75% % distal segmental stenoses.  Patent saphenous vein  graft to the second obtuse marginal which supplies the entire circumflex territory.  Widely patent LIMA to the distal segment of the LAD. Competitive flow is noted.  Low normal LV systolic function with EF 50 to 55%. Mildly elevated end-diastolic pressure.  Continuous sharp left chest pain not felt to be ischemia related.  RECOMMENDATIONS:   Consider musculoskeletal or neuropathic pain as a source of the patient's presentation.  Continue aggressive risk factor modification for secondary prevention of ischemic events.  If No Local complications, could be discharged later today.    Diagnostic Diagram       Echo 07/20/18: Study Conclusions  - Left ventricle: The cavity size was normal. There was severe   concentric hypertrophy. Systolic function was vigorous. The   estimated ejection fraction was in the range of 65% to 70%. Wall   motion was normal; there were no regional wall motion   abnormalities. Doppler parameters are consistent with abnormal   left ventricular relaxation (grade 1 diastolic dysfunction).   Doppler parameters are consistent with elevated mean left atrial   filling pressure. - Mitral valve: Valve area by pressure half-time: 2.04 cm^2. - Left atrium: The atrium was mildly dilated.  Patient Profile     77 y.o. male with h/o CAD s/p CABG, DM, HTN, HLD, OSA, chronic diastolic CHF and CKD who was here this week following an anterior cervical disc fusion with subsequent cardiopulmonary arrest. He had c/o difficulty swallowing and then was found unresponsive with asystole. Unclear how long he was down for. He is now intubated and on the cooling protocol.   Assessment & Plan    1. Cardiac arrest with asystole. This does not appear to be related to an acute coronary event. Troponin minimally elevated and no acute Ecg changes. Fairly recent ischemic evaluation in May with cardiac cath. Echo shows normal LV function. Suspect arrest related to a respiratory  process. On Levophed for hemodynamic support. Plan to rewarm today.  Continue crestor and ASA. Other cardiac meds on hold due to hypotension.  2. Acute respiratory failure with hypoxia. On vent. Management per CCM. 3. S/p cervical fusion. 4. Metabolic encephalopathy. Reassess with rewarming.   [facility] will sign off.   Medication Recommendations:  As per MAR. Consider resuming antianginal therapy with beta blocker/amlodpine once recovered.  Other recommendations (labs, testing, etc):  none Follow up as an outpatient:  With Dr. Daneen Schick  For questions or updates, please contact [place] Please consult [email or web address] for contact info under        Signed, Peter Martinique, MD  07/21/2018, 8:51 AM

## 2018-07-21 NOTE — Progress Notes (Signed)
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  Managed without steroids due to concerns of glycemic control challenge.  On 9/19 began to notice difficulty swallowing, not able to even swallow his own saliva.  Had been ordered Decadron but this was not given.  He was found by nursing staff unresponsive and in asystole.  He underwent 1 round of ACLS with successful return of spontaneous circulation.  Significant Hospital Events   9/16: Anterior cervical disc fusion 5/63: Asystolic arrest.  Estimated time to return of spontaneous circulation approximately 3 minutes.  Unresponsive not following commands postarrest so hypothermia protocol initiated.  Goal temperature met at: 1030 pm  9/20: Hemodynamically stable on low-dose norepinephrine 9/21: completed rewarming. Paralytic and sedation discontinued.  Consults: date of consult/date signed off & final recs:  Care consulted 9/20 status post cardiac arrest Procedures (surgical and bedside):  9/16: Anterior cervical disc fusion by neurosurgery 9/19: Oral endotracheal tube 9/19: Left internal jugular vein triple-lumen catheter>>> Significant Diagnostic Tests:  9/19: CT brain noncontrasted: Negative CT cervical spine 9/19: Status post C3-C7 ACDF with expected postoperative changes.  No fracture or malalignment  Micro Data:   Antimicrobials:     Subjective:  Sedated and under neuromuscular blockade for hypothermia protocol Objective   Blood pressure (!) 137/50, pulse 90, temperature 98.1 F (36.7 C), temperature source Bladder, resp. rate 10, height '5\' 8"'  (1.727 m), weight 127.5 kg,  SpO2 98 %. CVP:  [5 mmHg-7 mmHg] 5 mmHg  Vent Mode: PRVC FiO2 (%):  [40 %] 40 % Set Rate:  [14 bmp] 14 bmp Vt Set:  [550 mL] 550 mL PEEP:  [5 cmH20] 5 cmH20 Plateau Pressure:  [14 cmH20-21 cmH20] 18 cmH20   Intake/Output Summary (Last 24 hours) at 07/21/2018 1509 Last data filed at 07/21/2018 1400 Gross per 24 hour  Intake 4806.67 ml  Output 989 ml  Net 3817.67 ml   Filed Weights   07/19/18 1830 07/20/18 0445 07/21/18 0430  Weight: 127 kg 122.1 kg 127.5 kg    Examination: General: Obese 77 year old African-American male. Unresponsive at this time. HENT: OGT/ETT. PERL. No jvd.  Anterior cervical surgical site clean dry and intact. Tongue protrudes approximately 1cm beyond the lips. Lungs: Bilateral breath sounds. Clear to auscultation.  Cardiovascular: Regular rate and rhythm. No murmur, gallop or rub. Abdomen: Soft nontender. No organomegaly. Extremities: Warm to touch, no cyanosis or edema. Neuro: Does not respond to voice or tactile stimulation. GU: Clear yellow urine  Resolved Hospital Problem list    Assessment & Plan:  Status post asystolic cardiac arrest.  Presumed hypoxia mediated report of inadequate airway protection pre-arrest, swollen tongue so wonder about adverse reaction to medications??  Does have a history of coronary artery disease  H/O coronary artery disease and CABG back in 2019 with chronic diastolic heart failure Plan Continue telemetry monitoring Mean arterial pressure goal greater than 80. To continue due to CKD. Norepinephrine as needed Echocardiogram results: - Left ventricle: The cavity size was normal. There was severe   concentric hypertrophy. Systolic function was vigorous. The   estimated ejection fraction was in the range of 65% to 70%. Wall   motion was normal; there  were no regional wall motion   abnormalities. Doppler parameters are consistent with abnormal   left ventricular relaxation (grade 1 diastolic dysfunction).   Doppler  parameters are consistent with elevated mean left atrial   filling pressure. - Mitral valve: Valve area by pressure half-time: 2.04 cm^2. - Left atrium: The atrium was mildly dilated.  Cardiology recommends no cardiac workup, suspect arrest due to respiratory cause. Continue crestor and asa.  Ventilator dependence status post cardiopulmonary arrest H/o OSA   Plan Full ventilator support to continue. Transition to PAD protocol  VAP bundle Scheduled Decadron, given concern about airway compromise  Acute metabolic encephalopathy in setting of possible anoxic brain injury -Initial CT imaging negative Plan EEG revealed:The EEG isabnormalare findings are suggestive of moderate to severe generalized cerebral dysfunction.Epileptiform features were not seen during this recording. Consult neurology for possible anoxic encephalopathy.  Chronic kidney disease stage III  Plan Continue routine chemistry monitoring Strict intake and output Renal dose medications as appropriate  At risk for fluid and electrolyte balance Plan Trend chemistries, replace as indicated  Diabetes  Plan  Sliding scale insulin; maintain glucose 140-180 mg/dl  Status post anterior cervical disc fusion 9/16 Plan Decadron as above Routine surgical site assessment   Disposition / Summary of Today's Plan 07/21/18   See above    Diet: enteral feeding Pain/Anxiety/Delirium protocol (if indicated): PAD protocol VAP protocol (if indicated) ordered 9/20 DVT prophylaxis: low molecular weight heparin  GI prophylaxis: H2 blockade Hyperglycemia protocol: Hyperglycemia protocol Mobility: Bedrest Code Status: Full code Family Communication: no one available today  Labs   CBC: Recent Labs  Lab 07/16/18 0242 07/16/18 1818 07/19/18 1716 07/19/18 1901 07/19/18 2050 07/19/18 2305 07/20/18 0110 07/21/18 0407  WBC 11.3* 10.8* 10.2  --   --   --   --  14.6*  NEUTROABS  --   --  7.4  --   --   --   --   --     HGB 12.9* 13.7 18.1* 13.9 13.9 13.9 13.6 13.3  HCT 40.7 43.4 57.3* 41.0 41.0 41.0 40.0 41.2  MCV 88.7 89.9 89.0  --   --   --   --  86.4  PLT 274 244 145*  --   --   --   --  850   Basic Metabolic Panel: Recent Labs  Lab 07/19/18 1716  07/20/18 1119 07/20/18 1638 07/20/18 2058 07/20/18 2359 07/21/18 0407 07/21/18 0759 07/21/18 1159  NA 137   < > 140 141 139 140 140 139 141  K 4.0   < > 4.0 4.1 3.8 3.5 3.5 3.5 4.0  CL 95*   < > 104 106 105 107 106 104 106  CO2 26   < > '27 25 25 26 27 26 27  ' GLUCOSE 318*   < > 160* 177* 261* 205* 217* 169* 174*  BUN 15   < > '12 11 13 12 14 15 17  ' CREATININE 1.30*   < > 0.72 0.76 0.79 0.76 0.87 0.98 1.10  CALCIUM 8.6*   < > 8.3* 8.0* 8.0* 8.0* 7.9* 7.8* 7.8*  MG 1.8  --  1.8 1.8  --   --  2.0  --   --   PHOS  --   --  1.3*  --  3.4  --  2.6  --   --    < > = values in this interval not displayed.   GFR: Estimated Creatinine Clearance: 74.3 mL/min (by C-G formula based on SCr  of 1.1 mg/dL). Recent Labs  Lab 07/16/18 0242 07/16/18 1818 07/19/18 1716 07/21/18 0407  WBC 11.3* 10.8* 10.2 14.6*  LATICACIDVEN  --   --  5.7*  --    Liver Function Tests: Recent Labs  Lab 07/19/18 1716  AST 135*  ALT 105*  ALKPHOS 89  BILITOT 1.0  PROT 6.6  ALBUMIN 3.2*   No results for input(s): LIPASE, AMYLASE in the last 168 hours. No results for input(s): AMMONIA in the last 168 hours. ABG    Component Value Date/Time   PHART 7.426 07/20/2018 0908   PCO2ART 43.1 07/20/2018 0908   PO2ART 126.0 (H) 07/20/2018 0908   HCO3 29.7 (H) 07/20/2018 0908   TCO2 31 07/20/2018 0908   ACIDBASEDEF 2.0 11/09/2016 2003   O2SAT 99.0 07/20/2018 0908    Coagulation Profile: Recent Labs  Lab 07/19/18 1858 07/20/18 0312  INR 1.16 1.20   Cardiac Enzymes: Recent Labs  Lab 07/19/18 1716 07/19/18 2314 07/20/18 0402 07/20/18 1119  TROPONINI <0.03 0.30* 0.25* 0.16*   HbA1C: Hgb A1c MFr Bld  Date/Time Value Ref Range Status  06/19/2018 10:56 AM 9.5 (H)  4.6 - 6.5 % Final    Comment:    Glycemic Control Guidelines for People with Diabetes:Non Diabetic:  <6%Goal of Therapy: <7%Additional Action Suggested:  >8%   03/13/2018 10:53 AM 10.2 (H) 4.6 - 6.5 % Final    Comment:    Glycemic Control Guidelines for People with Diabetes:Non Diabetic:  <6%Goal of Therapy: <7%Additional Action Suggested:  >8%    CBG: Recent Labs  Lab 07/21/18 0703 07/21/18 0935 07/21/18 1038 07/21/18 1142 07/21/18 1355  GLUCAP 163* 133* 187* 167* 181*    CRITICAL CARE Performed by: Jesus Genera   Total critical care time: 38 minutes  Critical care time was exclusive of separately billable procedures and treating other patients.  Critical care was necessary to treat or prevent imminent or life-threatening deterioration.  Critical care was time spent personally by me on the following activities: development of treatment plan with patient and/or surrogate as well as nursing, discussions with consultants, evaluation of patient's response to treatment, examination of patient, obtaining history from patient or surrogate, ordering and performing treatments and interventions, ordering and review of laboratory studies, ordering and review of radiographic studies, pulse oximetry and re-evaluation of patient's condition.

## 2018-07-21 NOTE — Progress Notes (Signed)
Dr. Sabra Heck made aware of low urine output and creatinine starting to climb.  No new orders at this time.

## 2018-07-21 NOTE — Progress Notes (Signed)
Remains on cooling protocol.  Paralyzed and sedated currently.  Wound soft.  No new recommendations.

## 2018-07-22 DIAGNOSIS — G8220 Paraplegia, unspecified: Secondary | ICD-10-CM

## 2018-07-22 DIAGNOSIS — G9340 Encephalopathy, unspecified: Secondary | ICD-10-CM

## 2018-07-22 LAB — BASIC METABOLIC PANEL
ANION GAP: 8 (ref 5–15)
ANION GAP: 8 (ref 5–15)
ANION GAP: 7 (ref 5–15)
ANION GAP: 7 (ref 5–15)
Anion gap: 8 (ref 5–15)
Anion gap: 9 (ref 5–15)
BUN: 28 mg/dL — AB (ref 8–23)
BUN: 32 mg/dL — ABNORMAL HIGH (ref 8–23)
BUN: 34 mg/dL — AB (ref 8–23)
BUN: 36 mg/dL — AB (ref 8–23)
BUN: 37 mg/dL — AB (ref 8–23)
BUN: 40 mg/dL — AB (ref 8–23)
CALCIUM: 7.8 mg/dL — AB (ref 8.9–10.3)
CALCIUM: 7.7 mg/dL — AB (ref 8.9–10.3)
CHLORIDE: 109 mmol/L (ref 98–111)
CHLORIDE: 109 mmol/L (ref 98–111)
CO2: 26 mmol/L (ref 22–32)
CO2: 26 mmol/L (ref 22–32)
CO2: 27 mmol/L (ref 22–32)
CO2: 27 mmol/L (ref 22–32)
CO2: 27 mmol/L (ref 22–32)
CO2: 27 mmol/L (ref 22–32)
Calcium: 7.6 mg/dL — ABNORMAL LOW (ref 8.9–10.3)
Calcium: 7.7 mg/dL — ABNORMAL LOW (ref 8.9–10.3)
Calcium: 7.3 mg/dL — ABNORMAL LOW (ref 8.9–10.3)
Calcium: 7.4 mg/dL — ABNORMAL LOW (ref 8.9–10.3)
Chloride: 108 mmol/L (ref 98–111)
Chloride: 107 mmol/L (ref 98–111)
Chloride: 106 mmol/L (ref 98–111)
Chloride: 107 mmol/L (ref 98–111)
Creatinine, Ser: 1.32 mg/dL — ABNORMAL HIGH (ref 0.61–1.24)
Creatinine, Ser: 1.44 mg/dL — ABNORMAL HIGH (ref 0.61–1.24)
Creatinine, Ser: 1.44 mg/dL — ABNORMAL HIGH (ref 0.61–1.24)
Creatinine, Ser: 1.43 mg/dL — ABNORMAL HIGH (ref 0.61–1.24)
Creatinine, Ser: 1.36 mg/dL — ABNORMAL HIGH (ref 0.61–1.24)
Creatinine, Ser: 1.33 mg/dL — ABNORMAL HIGH (ref 0.61–1.24)
GFR calc Af Amer: 59 mL/min — ABNORMAL LOW (ref >60)
GFR calc Af Amer: 53 mL/min — ABNORMAL LOW (ref >60)
GFR calc Af Amer: 53 mL/min — ABNORMAL LOW (ref >60)
GFR calc Af Amer: 57 mL/min — ABNORMAL LOW (ref >60)
GFR calc Af Amer: 58 mL/min — ABNORMAL LOW (ref >60)
GFR calc non Af Amer: 51 mL/min — ABNORMAL LOW (ref >60)
GFR calc non Af Amer: 46 mL/min — ABNORMAL LOW (ref >60)
GFR calc non Af Amer: 46 mL/min — ABNORMAL LOW (ref >60)
GFR calc non Af Amer: 46 mL/min — ABNORMAL LOW (ref >60)
GFR calc non Af Amer: 49 mL/min — ABNORMAL LOW (ref >60)
GFR, EST AFRICAN AMERICAN: 53 mL/min — AB (ref >60)
GFR, EST NON AFRICAN AMERICAN: 50 mL/min — AB (ref >60)
GLUCOSE: 189 mg/dL — AB (ref 70–99)
GLUCOSE: 124 mg/dL — AB (ref 70–99)
GLUCOSE: 162 mg/dL — AB (ref 70–99)
Glucose, Bld: 175 mg/dL — ABNORMAL HIGH (ref 70–99)
Glucose, Bld: 173 mg/dL — ABNORMAL HIGH (ref 70–99)
Glucose, Bld: 186 mg/dL — ABNORMAL HIGH (ref 70–99)
POTASSIUM: 4.3 mmol/L (ref 3.5–5.1)
POTASSIUM: 4.4 mmol/L (ref 3.5–5.1)
POTASSIUM: 4.3 mmol/L (ref 3.5–5.1)
POTASSIUM: 4.6 mmol/L (ref 3.5–5.1)
Potassium: 4.3 mmol/L (ref 3.5–5.1)
Potassium: 4.4 mmol/L (ref 3.5–5.1)
SODIUM: 142 mmol/L (ref 135–145)
Sodium: 142 mmol/L (ref 135–145)
Sodium: 142 mmol/L (ref 135–145)
Sodium: 141 mmol/L (ref 135–145)
Sodium: 143 mmol/L (ref 135–145)
Sodium: 143 mmol/L (ref 135–145)

## 2018-07-22 LAB — PHOSPHORUS: Phosphorus: 4 mg/dL (ref 2.5–4.6)

## 2018-07-22 LAB — GLUCOSE, CAPILLARY
GLUCOSE-CAPILLARY: 202 mg/dL — AB (ref 70–99)
GLUCOSE-CAPILLARY: 164 mg/dL — AB (ref 70–99)
GLUCOSE-CAPILLARY: 122 mg/dL — AB (ref 70–99)
GLUCOSE-CAPILLARY: 192 mg/dL — AB (ref 70–99)
GLUCOSE-CAPILLARY: 207 mg/dL — AB (ref 70–99)
GLUCOSE-CAPILLARY: 97 mg/dL (ref 70–99)
GLUCOSE-CAPILLARY: 60 mg/dL — AB (ref 70–99)
GLUCOSE-CAPILLARY: 100 mg/dL — AB (ref 70–99)
GLUCOSE-CAPILLARY: 114 mg/dL — AB (ref 70–99)
GLUCOSE-CAPILLARY: 151 mg/dL — AB (ref 70–99)
GLUCOSE-CAPILLARY: 161 mg/dL — AB (ref 70–99)
GLUCOSE-CAPILLARY: 178 mg/dL — AB (ref 70–99)
Glucose-Capillary: 137 mg/dL — ABNORMAL HIGH (ref 70–99)
Glucose-Capillary: 163 mg/dL — ABNORMAL HIGH (ref 70–99)
Glucose-Capillary: 215 mg/dL — ABNORMAL HIGH (ref 70–99)
Glucose-Capillary: 181 mg/dL — ABNORMAL HIGH (ref 70–99)
Glucose-Capillary: 199 mg/dL — ABNORMAL HIGH (ref 70–99)
Glucose-Capillary: 175 mg/dL — ABNORMAL HIGH (ref 70–99)
Glucose-Capillary: 177 mg/dL — ABNORMAL HIGH (ref 70–99)
Glucose-Capillary: 164 mg/dL — ABNORMAL HIGH (ref 70–99)
Glucose-Capillary: 148 mg/dL — ABNORMAL HIGH (ref 70–99)
Glucose-Capillary: 108 mg/dL — ABNORMAL HIGH (ref 70–99)

## 2018-07-22 MED ORDER — LABETALOL HCL 5 MG/ML IV SOLN
INTRAVENOUS | Status: AC
  Filled 2018-07-22: qty 4

## 2018-07-22 MED ORDER — FENTANYL CITRATE (PF) 100 MCG/2ML IJ SOLN
25.0000 ug | INTRAMUSCULAR | Status: DC | PRN
  Administered 2018-07-22 – 2018-07-23 (×4): 50 ug via INTRAVENOUS
  Filled 2018-07-22 (×4): qty 2

## 2018-07-22 MED ORDER — HYDRALAZINE HCL 20 MG/ML IJ SOLN
INTRAMUSCULAR | Status: AC
  Filled 2018-07-22: qty 1

## 2018-07-22 MED ORDER — HYDRALAZINE HCL 20 MG/ML IJ SOLN
10.0000 mg | Freq: Four times a day (QID) | INTRAMUSCULAR | Status: DC | PRN
  Administered 2018-07-22 – 2018-07-23 (×3): 10 mg via INTRAVENOUS
  Filled 2018-07-22 (×2): qty 1

## 2018-07-22 MED ORDER — LABETALOL HCL 5 MG/ML IV SOLN
5.0000 mg | Freq: Once | INTRAVENOUS | Status: AC
  Administered 2018-07-22: 5 mg via INTRAVENOUS

## 2018-07-22 MED ORDER — DEXMEDETOMIDINE HCL IN NACL 400 MCG/100ML IV SOLN
0.4000 ug/kg/h | INTRAVENOUS | Status: DC
  Administered 2018-07-22: 0.8 ug/kg/h via INTRAVENOUS
  Administered 2018-07-22: 0.4 ug/kg/h via INTRAVENOUS
  Administered 2018-07-22: 0.8 ug/kg/h via INTRAVENOUS
  Administered 2018-07-23: 0.6 ug/kg/h via INTRAVENOUS
  Administered 2018-07-23: 0.3 ug/kg/h via INTRAVENOUS
  Administered 2018-07-23: 0.9 ug/kg/h via INTRAVENOUS
  Administered 2018-07-24: 0.5 ug/kg/h via INTRAVENOUS
  Administered 2018-07-24: 0.4 ug/kg/h via INTRAVENOUS
  Administered 2018-07-24: 0.7 ug/kg/h via INTRAVENOUS
  Filled 2018-07-22 (×11): qty 100

## 2018-07-22 NOTE — Consult Note (Signed)
Neurology Consultation Reason for Consult: Prognosis following cardiac arrest Referring Physician: Loleta Chance  CC: Altered mental status  History is obtained from: Chart review  HPI: Edward [name] is a 77 y.o. male with a history of cervical stenosis admitted on September 13 with neck pain and dizziness.  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  He was scheduled for scheduled  surgery and discharged but then was readmitted on the 13th with dizziness.  MRI of the brain on the 13th with no acute findings.  He underwent surgery on September 16.  He did well the day following surgery was getting to start work with physical therapy.  On the 19th, there was noted that he was having worsening dysphagia and then he suffered a cardiac arrest in the afternoon of 9/19.  He was found pulseless on the floor and CODE BLUE was called and he had return of spontaneous circulation after a single dose of epinephrine.  I am uncertain of the downtime.  He did undergo cooling protocol beginning cooling at 10:30 PM on 9/19.  EEG was slow activity, but no epileptiform features.  On 9/21 rewarming was completed paralytic and sedation was discontinued.  He continues to be encephalopathic and therefore neurology has been consulted.    ROS: A 14 point ROS was performed and is negative except as noted in the HPI.   Past Medical History:  Diagnosis Date  . Adenomatous colon polyp 04/2011  . CAD (coronary artery disease)    a. 01/2015 DES to LAD  b. 12/2015: [place] 85% oLCx lesion--> rx therapy.  . Chronic diastolic CHF (congestive heart failure) ([place])    a. 05/2017 Echo: EF 60-65%, Gr1 DD, no rwma, mildly dil LA, nl RV fxn, mild TR.  . CKD (chronic kidney disease), stage II    a. probable CKD II-III with baseline CR 1.1-1.3.  . DM type 2 (diabetes mellitus, type 2), insulin dependent 01/29/2014   fasting cbg 50-120 with new regimen  .  Dyslipidemia, goal LDL below 70 01/29/2014  . Enlarged prostate   . Essential tremor    a. on proprnolol  . GERD (gastroesophageal reflux disease)   . Hx of CABG   . Hypertension   . Internal hemorrhoid   . Sleep apnea    does not use cpap (06/04/2015)  . TIA (transient ischemic attack) 2002     Family History  Problem Relation Age of Onset  . CAD Brother        2 brothers - CABG  . Alzheimer's disease Sister   . CAD Sister   . Prostate cancer Brother   . Asthma Brother        2 brothers   . Diabetes Other        entire family  . Colon cancer Neg Hx      Social History:  reports that he has never smoked. He has never used smokeless tobacco. He reports that he does not drink alcohol or use drugs.   Exam: Current vital signs: BP (!) 129/57   Pulse 64   Temp 98.2 F (36.8 C) (Core (Comment))   Resp 14   Ht 5\' 8"  (1.727 m)   Wt 127.3 kg Comment: 131-3.67(artic pads weight)  SpO2 96%   BMI 42.68 kg/m  Vital signs in last 24 hours: Temp:  [98.2 F (36.8 C)-99 F (37.2 C)] 98.2 F (36.8 C) (09/22 1612) Pulse Rate:  [  64-127] 64 (09/22 1800) Resp:  [0-29] 14 (09/22 1800) BP: (124-229)/(50-92) 129/57 (09/22 1800) SpO2:  [94 %-100 %] 96 % (09/22 1800) Arterial Line BP: (109-254)/(46-100) 113/46 (09/22 1800) FiO2 (%):  [40 %] 40 % (09/22 1538) Weight:  [127.3 kg] 127.3 kg (09/22 0348)   Physical Exam  Constitutional: Appears well-developed and well-nourished.  Psych: Does not respond Eyes: No scleral injection HENT: ET tube in place Head: Normocephalic.  Cardiovascular: Normal rate and regular rhythm.  Respiratory: Ventilated GI: Soft.  No distension. There is no tenderness.  Skin: WDI  Neuro: Mental Status: Opens eyes to noxious stimulation, keeps eyes open and grimaces to noxious stimuli but does not follow commands or engage with the examiner.  He is briskly purposeful with his arms trying to grab the tube. Cranial Nerves: II: Does not clearly blink to  threat pupils are slightly unequal, right larger than left both are reactive III,IV, VI: He resists checking doll's eye maneuver V: VII: Corneals are intact X: Cough is intact Motor: He is briskly purposeful with good strength in bilateral upper extremities, with noxious stimulation in the lower extremities he does grimace and internally rotates at the hip slightly, but no movement distally. Sensory: He does respond to noxious ablation in all 4 extremities Cerebellar: He does not perform  I have reviewed labs in epic and the results pertinent to this consultation are: Cr 1.4 Calcium 7.7  I have reviewed the images obtained: CT head from 9/19 unremarkable  Impression: 77 year old male with paraparesis following cardiac arrest with recent spinal surgery.  He had severe multilevel spondylosis in the cervical spine and I wonder if his thoracic spine also has disease which could have been possibly exacerbated during his cardiac arrest and CPR.  From an anoxic brain injury standpoint, he is purposeful this soon after his rewarming, no clear evidence of poor prognosis at this time.  Recommendations: 1) continue supportive care 2) MRI brain, C-spine, T-spine 3) neurology will follow   Roland Rack, MD [facility] [phone]  If 7pm- 7am, please page neurology on call as listed in [place].

## 2018-07-22 NOTE — Progress Notes (Signed)
NAME:  Edward [name], MRN:  [id number], DOB:  17-Jan-1941, LOS: 9 ADMISSION DATE:  07/13/2018, CONSULTATION DATE: 9/19 REFERRING MD:  Roylene Reason, CHIEF COMPLAINT:  Cardiac arrest    Brief History   77 year old patient with history of diabetes with history of difficult glycemic control and cervical stenosis resulting in cord compression underwent anterior cervical disc fusion of cervical spine on September 16.  Managed without steroids due to concerns of glycemic control challenge.  On 9/19 began to notice difficulty swallowing, not able to even swallow his own saliva.  Had been ordered Decadron but this was not given.  He was found by nursing staff unresponsive and in asystole.  He underwent 1 round of ACLS with successful return of spontaneous circulation.  Significant Hospital Events   9/16: Anterior cervical disc fusion 5/32: Asystolic arrest.  Estimated time to return of spontaneous circulation approximately 3 minutes.  Unresponsive not following commands postarrest so hypothermia protocol initiated.  Goal temperature met at: 1030 pm  9/20: Hemodynamically stable on low-dose norepinephrine 9/21: completed rewarming. Paralytic and sedation discontinued. 9/22: paralysis/sedation and vasopressor therapy discontinued yesterday. Restlessness developed last night. IV fentanyl started. Reqired iv labetalol to treat HTN. Has a h/o HTN.  Consults: date of consult/date signed off & final recs:  Care consulted 9/20 status post cardiac arrest Procedures (surgical and bedside):  9/16: Anterior cervical disc fusion by neurosurgery 9/19: Oral endotracheal tube 9/19: Left internal jugular vein triple-lumen catheter>>> Significant Diagnostic Tests:  9/19: CT brain noncontrasted: Negative CT cervical spine 9/19: Status post C3-C7 ACDF with expected postoperative changes.  No fracture or malalignment  Micro Data:   Antimicrobials:     Subjective:  Sedated and under neuromuscular blockade for  hypothermia protocol Objective   Blood pressure (!) 179/62, pulse 85, temperature 98.6 F (37 C), temperature source Core, resp. rate 14, height _0  (1.727 m), weight 127.3 kg, SpO2 97 %. CVP:  [5 mmHg-15 mmHg] 12 mmHg  Vent Mode: PRVC FiO2 (%):  [40 %] 40 % Set Rate:  [14 bmp] 14 bmp Vt Set:  [550 mL] 550 mL PEEP:  [5 cmH20] 5 cmH20 Plateau Pressure:  [12 cmH20-27 cmH20] 23 cmH20   Intake/Output Summary (Last 24 hours) at 07/22/2018 1220 Last data filed at 07/22/2018 1158 Gross per 24 hour  Intake 4134.61 ml  Output 750 ml  Net 3384.61 ml   Filed Weights   07/20/18 0445 07/21/18 0430 07/22/18 0348  Weight: 122.1 kg 127.5 kg 127.3 kg    Examination: General: Obese 77 year old African-American male. On 300 mcg of fentanyl/hr. HENT: OGT/ETT. PERL. No jvd.  Anterior cervical surgical site clean dry and intact. Tongue protrudes approximately 1cm beyond the lips. Lungs: Bilateral breath sounds. Clear to auscultation.  Cardiovascular: Regular rate and rhythm. No murmur, gallop or rub. Abdomen: Soft nontender. No organomegaly. Extremities: Warm to touch, +pedal edema Neuro: Does not respond to voice or tactile stimulation. +doll's eye, pupillary and corneal reflexes. No elicited plantar reflex. No withdrawal of extremities to tactile stimulation. GU: Foley catheter.  Resolved Hospital Problem list    Assessment & Plan:  Status post asystolic cardiac arrest.  Presumed hypoxia mediated report of inadequate airway protection pre-arrest, swollen tongue so wonder about adverse reaction to medications??  Does have a history of coronary artery disease  H/O coronary artery disease and CABG back in 2019 with chronic diastolic heart failure Plan Continue telemetry monitoring Levophed not required at this time. Echocardiogram results: - Left ventricle: The cavity size was normal. There was  severe   concentric hypertrophy. Systolic function was vigorous. The   estimated ejection fraction  was in the range of 65% to 70%. Wall   motion was normal; there were no regional wall motion   abnormalities. Doppler parameters are consistent with abnormal   left ventricular relaxation (grade 1 diastolic dysfunction).   Doppler parameters are consistent with elevated mean left atrial   filling pressure. - Mitral valve: Valve area by pressure half-time: 2.04 cm^2. - Left atrium: The atrium was mildly dilated.  Cardiology recommends no cardiac workup, suspect arrest due to respiratory cause. Continue crestor and asa.  Ventilator dependence status post cardiopulmonary arrest H/o OSA   Plan Full ventilator support to continue. Transition to PAD protocol  VAP bundle Scheduled Decadron, given concern about airway compromise. Nurse to ask neurosurgery if the dose can be decreased or discontinued at this time as it is likely having an effect on blood pressure and glycemic control.  Acute metabolic encephalopathy in setting of possible anoxic brain injury -Initial CT imaging negative Plan EEG revealed:The EEG isabnormalare findings are suggestive of moderate to severe generalized cerebral dysfunction.Epileptiform features were not seen during this recording. Consult neurology for possible anoxic encephalopathy; pending. Start precedex, to improve agitation and d/c fentanyl  Chronic kidney disease stage III  Plan Continue routine chemistry monitoring Strict intake and output Renal dose medications as appropriate  At risk for fluid and electrolyte balance Plan Trend chemistries, replace as indicated  Diabetes  Plan  IV insulin at 10 units/hr. Goal is 140-159m/dl  Status post anterior cervical disc fusion 9/16 Plan Decadron as above Routine surgical site assessment  HTN -prn labetalol. Will assess for enteral metoprolol after initiation of precedex. Was on norvasc, propanolol, and lasix at home.  Disposition / Summary of Today's Plan 07/22/18   See above    Diet:  enteral feeding Pain/Anxiety/Delirium protocol (if indicated): PAD protocol VAP protocol (if indicated) ordered 9/20 DVT prophylaxis: low molecular weight heparin  GI prophylaxis: H2 blockade Hyperglycemia protocol: Hyperglycemia protocol Mobility: Bedrest Code Status: Full code Family Communication: no one available today  Labs   CBC: Recent Labs  Lab 07/16/18 0242 07/16/18 1818 07/19/18 1716 07/19/18 1901 07/19/18 2050 07/19/18 2305 07/20/18 0110 07/21/18 0407  WBC 11.3* 10.8* 10.2  --   --   --   --  14.6*  NEUTROABS  --   --  7.4  --   --   --   --   --   HGB 12.9* 13.7 18.1* 13.9 13.9 13.9 13.6 13.3  HCT 40.7 43.4 57.3* 41.0 41.0 41.0 40.0 41.2  MCV 88.7 89.9 89.0  --   --   --   --  86.4  PLT 274 244 145*  --   --   --   --  2280  Basic Metabolic Panel: Recent Labs  Lab 07/19/18 1716  07/20/18 1119 07/20/18 1638 07/20/18 2058  07/21/18 0407  07/21/18 1617 07/21/18 2020 07/22/18 0022 07/22/18 0437 07/22/18 0824 07/22/18 1137  NA 137   < > 140 141 139   < > 140   < > 141 142 142 142 142 141  K 4.0   < > 4.0 4.1 3.8   < > 3.5   < > 4.0 3.9 4.3 4.3 4.4 4.4  CL 95*   < > 104 106 105   < > 106   < > 107 107 108 107 106 107  CO2 26   < > _0 < >  27   < > _0 GLUCOSE 318*   < > 160* 177* 261*   < > 217*   < > 125* 154* 175* 173* 186* 189*  BUN 15   < > _1 < > 14   < > 21 24* 28* 32* 34* 37*  CREATININE 1.30*   < > 0.72 0.76 0.79   < > 0.87   < > 1.29* 1.30* 1.32* 1.44* 1.44* 1.43*  CALCIUM 8.6*   < > 8.3* 8.0* 8.0*   < > 7.9*   < > 7.8* 7.9* 7.8* 7.7* 7.6* 7.7*  MG 1.8  --  1.8 1.8  --   --  2.0  --  2.0  --   --   --   --   --   PHOS  --   --  1.3*  --  3.4  --  2.6  --  3.5  --   --  4.0  --   --    < > = values in this interval not displayed.   GFR: Estimated Creatinine Clearance: 57.2 mL/min (A) (by C-G formula based on SCr of 1.43 mg/dL (H)). Recent Labs  Lab 07/16/18 0242 07/16/18 1818 07/19/18 1716 07/21/18 0407  WBC  11.3* 10.8* 10.2 14.6*  LATICACIDVEN  --   --  5.7*  --    Liver Function Tests: Recent Labs  Lab 07/19/18 1716  AST 135*  ALT 105*  ALKPHOS 89  BILITOT 1.0  PROT 6.6  ALBUMIN 3.2*   No results for input(s): LIPASE, AMYLASE in the last 168 hours. No results for input(s): AMMONIA in the last 168 hours. ABG    Component Value Date/Time   PHART 7.426 07/20/2018 0908   PCO2ART 43.1 07/20/2018 0908   PO2ART 126.0 (H) 07/20/2018 0908   HCO3 29.7 (H) 07/20/2018 0908   TCO2 31 07/20/2018 0908   ACIDBASEDEF 2.0 11/09/2016 2003   O2SAT 99.0 07/20/2018 0908    Coagulation Profile: Recent Labs  Lab 07/19/18 1858 07/20/18 0312  INR 1.16 1.20   Cardiac Enzymes: Recent Labs  Lab 07/19/18 1716 07/19/18 2314 07/20/18 0402 07/20/18 1119  TROPONINI <0.03 0.30* 0.25* 0.16*   HbA1C: Hgb A1c MFr Bld  Date/Time Value Ref Range Status  06/19/2018 10:56 AM 9.5 (H) 4.6 - 6.5 % Final    Comment:    Glycemic Control Guidelines for People with Diabetes:Non Diabetic:  <6%Goal of Therapy: <7%Additional Action Suggested:  >8%   03/13/2018 10:53 AM 10.2 (H) 4.6 - 6.5 % Final    Comment:    Glycemic Control Guidelines for People with Diabetes:Non Diabetic:  <6%Goal of Therapy: <7%Additional Action Suggested:  >8%    CBG: Recent Labs  Lab 07/22/18 0614 07/22/18 0805 07/22/18 0858 07/22/18 1009 07/22/18 1058  GLUCAP 181* 199* 122* 192* 207*    CRITICAL CARE Performed by: Jesus Genera   Total critical care time: 44 minutes  Critical care time was exclusive of separately billable procedures and treating other patients.  Critical care was necessary to treat or prevent imminent or life-threatening deterioration.  Critical care was time spent personally by me on the following activities: development of treatment plan with patient and/or surrogate as well as nursing, discussions with consultants, evaluation of patient's response to treatment, examination of patient, obtaining  history from patient or surrogate, ordering and performing treatments and interventions, ordering and review of laboratory studies, ordering and review of radiographic studies, pulse oximetry  and re-evaluation of patient's condition.

## 2018-07-22 NOTE — Progress Notes (Signed)
[facility] Progress Note Patient Name: Edward [name] DOB: Jul 03, 1941 MRN: [id number]   Date of Service  07/22/2018  HPI/Events of Note  Agitation - BP = 193/76 and HR = 123.  eICU Interventions  Will order: 1. Increase ceiling on Precedex IV infusion to 1.7 mcg/kg/hour. 2. Fentanyl 25-50 mcg IV Q 1 hour PRN agitation or pain.      Intervention Category Major Interventions: Delirium, psychosis, severe agitation - evaluation and management  Izaiha Lo Eugene 07/22/2018, 10:07 PM

## 2018-07-22 NOTE — Progress Notes (Signed)
Subjective: Patient reports on vent  Objective: Vital signs in last 24 hours: Temp:  [98.2 F (36.8 C)-99 F (37.2 C)] 98.8 F (37.1 C) (09/22 1300) Pulse Rate:  [80-127] 87 (09/22 1300) Resp:  [0-29] 8 (09/22 1300) BP: (118-229)/(50-83) 171/59 (09/22 1300) SpO2:  [95 %-100 %] 96 % (09/22 1300) Arterial Line BP: (112-254)/(52-100) 117/64 (09/22 1300) FiO2 (%):  [40 %] 40 % (09/22 1100) Weight:  [127.3 kg] 127.3 kg (09/22 0348)  Intake/Output from previous day: 09/21 0701 - 09/22 0700 In: 4484.4 [I.V.:3464.4; NG/GT:920; IV Piggyback:100] Out: 715 [Urine:715] Intake/Output this shift: Total I/O In: 868.6 [I.V.:668.6; NG/GT:150; IV Piggyback:50] Out: 185 [Urine:185]  Physical Exam: Sedated on vent.  Becomes agitated and moves both upper extremities, but reportedly does not move legs, even while agitated.  Cervical incision is CDI and appears soft without significant swelling.  Lab Results: Recent Labs    07/19/18 1716  07/20/18 0110 07/21/18 0407  WBC 10.2  --   --  14.6*  HGB 18.1*   < > 13.6 13.3  HCT 57.3*   < > 40.0 41.2  PLT 145*  --   --  259   < > = values in this interval not displayed.   BMET Recent Labs    07/22/18 0824 07/22/18 1137  NA 142 141  K 4.4 4.4  CL 106 107  CO2 27 26  GLUCOSE 186* 189*  BUN 34* 37*  CREATININE 1.44* 1.43*  CALCIUM 7.6* 7.7*    Studies/Results: Dg Chest Port 1 View  Result Date: 07/21/2018 CLINICAL DATA:  Acute respiratory failure. EXAM: PORTABLE CHEST 1 VIEW COMPARISON:  07/20/2018 and 07/19/2018 and 03/24/2018 FINDINGS: Endotracheal tube tip is approximately 18 mm above the carina. Central line tip is in the cavoatrial junction, 5.0 cm below the carina. NG tube tip is below the diaphragm. Heart size and vascularity are normal. Slight bibasilar atelectasis, essentially unchanged. Pulmonary vascularity is normal. No acute bone abnormality. IMPRESSION: No significant change since the prior study. Slight bibasilar  atelectasis, left greater than right. Electronically Signed   By: Lorriane Shire M.D.   On: 07/21/2018 08:23    Assessment/Plan: Normothermic, weaning sedation intermittently.   Plan is to begin weaning ventilator tomorrow.  Will monitor lower extremity function.  We appreciate the care our patient is receiving.    LOS: 9 days    Peggyann Shoals, MD 07/22/2018, 1:54 PM

## 2018-07-22 NOTE — Progress Notes (Signed)
Dr Emmit Alexanders notified that pt is restless and agitated SBP>200. Pt is not tolerating ventilator. MD states is OK to restart Fentanyl drip. Will continue to monitor pt closely.

## 2018-07-23 ENCOUNTER — Inpatient Hospital Stay (HOSPITAL_COMMUNITY): Payer: Medicare Other

## 2018-07-23 LAB — GLUCOSE, CAPILLARY
GLUCOSE-CAPILLARY: 234 mg/dL — AB (ref 70–99)
GLUCOSE-CAPILLARY: 191 mg/dL — AB (ref 70–99)
GLUCOSE-CAPILLARY: 200 mg/dL — AB (ref 70–99)
GLUCOSE-CAPILLARY: 184 mg/dL — AB (ref 70–99)
GLUCOSE-CAPILLARY: 155 mg/dL — AB (ref 70–99)
GLUCOSE-CAPILLARY: 111 mg/dL — AB (ref 70–99)
GLUCOSE-CAPILLARY: 129 mg/dL — AB (ref 70–99)
Glucose-Capillary: 103 mg/dL — ABNORMAL HIGH (ref 70–99)
Glucose-Capillary: 119 mg/dL — ABNORMAL HIGH (ref 70–99)
Glucose-Capillary: 119 mg/dL — ABNORMAL HIGH (ref 70–99)
Glucose-Capillary: 122 mg/dL — ABNORMAL HIGH (ref 70–99)
Glucose-Capillary: 143 mg/dL — ABNORMAL HIGH (ref 70–99)
Glucose-Capillary: 168 mg/dL — ABNORMAL HIGH (ref 70–99)
Glucose-Capillary: 171 mg/dL — ABNORMAL HIGH (ref 70–99)
Glucose-Capillary: 200 mg/dL — ABNORMAL HIGH (ref 70–99)
Glucose-Capillary: 230 mg/dL — ABNORMAL HIGH (ref 70–99)
Glucose-Capillary: 245 mg/dL — ABNORMAL HIGH (ref 70–99)
Glucose-Capillary: 255 mg/dL — ABNORMAL HIGH (ref 70–99)
Glucose-Capillary: 261 mg/dL — ABNORMAL HIGH (ref 70–99)
Glucose-Capillary: 85 mg/dL (ref 70–99)

## 2018-07-23 LAB — BASIC METABOLIC PANEL
ANION GAP: 11 (ref 5–15)
Anion gap: 10 (ref 5–15)
Anion gap: 7 (ref 5–15)
Anion gap: 9 (ref 5–15)
BUN: 38 mg/dL — AB (ref 8–23)
BUN: 36 mg/dL — ABNORMAL HIGH (ref 8–23)
BUN: 38 mg/dL — AB (ref 8–23)
BUN: 34 mg/dL — ABNORMAL HIGH (ref 8–23)
CALCIUM: 7.5 mg/dL — AB (ref 8.9–10.3)
CALCIUM: 7.9 mg/dL — AB (ref 8.9–10.3)
CHLORIDE: 109 mmol/L (ref 98–111)
CHLORIDE: 110 mmol/L (ref 98–111)
CO2: 24 mmol/L (ref 22–32)
CO2: 26 mmol/L (ref 22–32)
CO2: 26 mmol/L (ref 22–32)
CO2: 28 mmol/L (ref 22–32)
CREATININE: 1.27 mg/dL — AB (ref 0.61–1.24)
CREATININE: 1.22 mg/dL (ref 0.61–1.24)
Calcium: 7.2 mg/dL — ABNORMAL LOW (ref 8.9–10.3)
Calcium: 8.1 mg/dL — ABNORMAL LOW (ref 8.9–10.3)
Chloride: 105 mmol/L (ref 98–111)
Chloride: 107 mmol/L (ref 98–111)
Creatinine, Ser: 1.04 mg/dL (ref 0.61–1.24)
Creatinine, Ser: 1.09 mg/dL (ref 0.61–1.24)
GFR calc Af Amer: >60 mL/min (ref >60)
GFR calc Af Amer: >60 mL/min (ref >60)
GFR calc non Af Amer: >60 mL/min (ref >60)
GFR calc non Af Amer: >60 mL/min (ref >60)
GFR, EST NON AFRICAN AMERICAN: 53 mL/min — AB (ref >60)
GFR, EST NON AFRICAN AMERICAN: 56 mL/min — AB (ref >60)
GLUCOSE: 157 mg/dL — AB (ref 70–99)
GLUCOSE: 119 mg/dL — AB (ref 70–99)
Glucose, Bld: 274 mg/dL — ABNORMAL HIGH (ref 70–99)
Glucose, Bld: 275 mg/dL — ABNORMAL HIGH (ref 70–99)
POTASSIUM: 3.7 mmol/L (ref 3.5–5.1)
POTASSIUM: 3.7 mmol/L (ref 3.5–5.1)
Potassium: 4.2 mmol/L (ref 3.5–5.1)
Potassium: 4.4 mmol/L (ref 3.5–5.1)
SODIUM: 142 mmol/L (ref 135–145)
SODIUM: 143 mmol/L (ref 135–145)
Sodium: 142 mmol/L (ref 135–145)
Sodium: 145 mmol/L (ref 135–145)

## 2018-07-23 MED ORDER — FENTANYL CITRATE (PF) 100 MCG/2ML IJ SOLN
50.0000 ug | Freq: Once | INTRAMUSCULAR | Status: AC
  Administered 2018-07-23: 50 ug via INTRAVENOUS
  Filled 2018-07-23: qty 2

## 2018-07-23 MED ORDER — LABETALOL HCL 5 MG/ML IV SOLN
10.0000 mg | INTRAVENOUS | Status: DC | PRN
  Administered 2018-07-23 – 2018-07-27 (×4): 10 mg via INTRAVENOUS
  Filled 2018-07-23 (×4): qty 4

## 2018-07-23 MED ORDER — AMLODIPINE BESYLATE 10 MG PO TABS
10.0000 mg | ORAL_TABLET | Freq: Every day | ORAL | Status: DC
  Administered 2018-07-24 – 2018-07-28 (×4): 10 mg
  Filled 2018-07-23 (×4): qty 1

## 2018-07-23 MED ORDER — FENTANYL 2500MCG IN NS 250ML (10MCG/ML) PREMIX INFUSION
25.0000 ug/h | INTRAVENOUS | Status: DC
  Administered 2018-07-23: 50 ug/h via INTRAVENOUS
  Administered 2018-07-24: 175 ug/h via INTRAVENOUS
  Filled 2018-07-23 (×2): qty 250

## 2018-07-23 MED ORDER — AMLODIPINE BESYLATE 5 MG PO TABS
5.0000 mg | ORAL_TABLET | Freq: Every day | ORAL | Status: DC
  Administered 2018-07-23: 5 mg
  Filled 2018-07-23: qty 1

## 2018-07-23 MED ORDER — FUROSEMIDE 10 MG/ML IJ SOLN
40.0000 mg | Freq: Once | INTRAMUSCULAR | Status: AC
  Administered 2018-07-23: 40 mg via INTRAVENOUS
  Filled 2018-07-23: qty 4

## 2018-07-23 MED ORDER — AMLODIPINE 1 MG/ML ORAL SUSPENSION: 5.0000 mg | Freq: Every day | ORAL | Status: DC

## 2018-07-23 MED ORDER — HYDRALAZINE HCL 20 MG/ML IJ SOLN
10.0000 mg | Freq: Four times a day (QID) | INTRAMUSCULAR | Status: DC | PRN
  Administered 2018-07-23 – 2018-07-28 (×9): 20 mg via INTRAVENOUS
  Filled 2018-07-23 (×9): qty 1

## 2018-07-23 MED ORDER — FENTANYL BOLUS VIA INFUSION
25.0000 ug | INTRAVENOUS | Status: DC | PRN
  Administered 2018-07-24 (×4): 25 ug via INTRAVENOUS
  Filled 2018-07-23: qty 25

## 2018-07-23 MED ORDER — AMLODIPINE BESYLATE 5 MG PO TABS
5.0000 mg | ORAL_TABLET | Freq: Once | ORAL | Status: AC
  Administered 2018-07-23: 5 mg via ORAL
  Filled 2018-07-23: qty 1

## 2018-07-23 NOTE — Progress Notes (Signed)
Neurosurgery Service Progress Note  Subjective: NAE ON, still intubated but FCx4  Objective: Vitals:   07/23/18 1119 07/23/18 1120 07/23/18 1135 07/23/18 1200  BP: (!) 194/70   (!) 187/68  Pulse: (!) 111   98  Resp: (!) 25   16  Temp:   98.8 F (37.1 C)   TempSrc:   Core   SpO2: 99% 99%  99%  Weight:      Height:       Temp (24hrs), Avg:98.4 F (36.9 C), Min:97.7 F (36.5 C), Max:98.8 F (37.1 C)  CBC Latest Ref Rng & Units 07/21/2018 07/20/2018 07/19/2018  WBC 4.0 - 10.5 K/uL 14.6(H) - -  Hemoglobin 13.0 - 17.0 g/dL 13.3 13.6 13.9  Hematocrit 39.0 - 52.0 % 41.2 40.0 41.0  Platelets 150 - 400 K/uL 259 - -   BMP Latest Ref Rng & Units 07/23/2018 07/23/2018 07/22/2018  Glucose 70 - 99 mg/dL 274(H) 275(H) 162(H)  BUN 8 - 23 mg/dL 38(H) 38(H) 36(H)  Creatinine 0.61 - 1.24 mg/dL 1.22 1.27(H) 1.33(H)  BUN/Creat Ratio 10 - 24 - - -  Sodium 135 - 145 mmol/L 143 142 143  Potassium 3.5 - 5.1 mmol/L 4.2 4.4 4.6  Chloride 98 - 111 mmol/L 110 109 109  CO2 22 - 32 mmol/L '26 24 27  ' Calcium 8.9 - 10.3 mg/dL 7.5(L) 7.2(L) 7.4(L)    Intake/Output Summary (Last 24 hours) at 07/23/2018 1345 Last data filed at 07/23/2018 1200 Gross per 24 hour  Intake 3535.26 ml  Output 4790 ml  Net -1254.74 ml    Current Facility-Administered Medications:  .  0.9 %  sodium chloride infusion, , Intravenous, Continuous, Aljishi, Virgina Norfolk, MD, Last Rate: 100 mL/hr at 07/23/18 1200 .  amLODipine (NORVASC) tablet 5 mg, 5 mg, Per Tube, Daily, Aljishi, Virgina Norfolk, MD, 5 mg at 07/23/18 1000 .  chlorhexidine gluconate (MEDLINE KIT) (PERIDEX) 0.12 % solution 15 mL, 15 mL, Mouth Rinse, BID, Aljishi, Virgina Norfolk, MD, 15 mL at 07/23/18 0745 .  dexamethasone (DECADRON) injection 2 mg, 2 mg, Intravenous, Q8H, Edis [name] A, MD, 2 mg at 07/23/18 0659 .  dexmedetomidine (PRECEDEX) 400 MCG/100ML (4 mcg/mL) infusion, 0.4-1.7 mcg/kg/hr, Intravenous, Titrated, Anders Simmonds, MD, Last Rate: 9.55 mL/hr at 07/23/18 1200, 0.3  mcg/kg/hr at 07/23/18 1200 .  dicyclomine (BENTYL) 10 MG/5ML syrup 10 mg, 10 mg, Per Tube, BID, Aljishi, Virgina Norfolk, MD, 10 mg at 07/23/18 1000 .  enoxaparin (LOVENOX) injection 40 mg, 40 mg, Subcutaneous, Q24H, Erick Colace, NP, 40 mg at 07/23/18 0959 .  erythromycin ophthalmic ointment 1 application, 1 application, Both Eyes, Daily PRN, Judith Part, MD .  famotidine (PEPCID) IVPB 20 mg premix, 20 mg, Intravenous, Q12H, Aldean Jewett, MD, Stopped at 07/23/18 1047 .  feeding supplement (PRO-STAT SUGAR FREE 64) liquid 30 mL, 30 mL, Per Tube, BID, Erick Colace, NP, 30 mL at 07/23/18 0959 .  feeding supplement (VITAL HIGH PROTEIN) liquid 1,000 mL, 1,000 mL, Per Tube, Q24H, Salvadore Dom E, NP, 1,000 mL at 07/23/18 1031 .  fentaNYL (SUBLIMAZE) bolus via infusion 25 mcg, 25 mcg, Intravenous, Q1H PRN, Omar Person, NP .  fentaNYL 2556mg in NS 2537m(1021mml) infusion-PREMIX, 25-400 mcg/hr, Intravenous, Continuous, Eubanks, Katalina M, NP, Last Rate: 15 mL/hr at 07/23/18 1200, 150 mcg/hr at 07/23/18 1200 .  hydrALAZINE (APRESOLINE) injection 10-20 mg, 10-20 mg, Intravenous, Q6H PRN, AlvRigoberto NoelD .  insulin detemir (LEVEMIR) injection 30 Units, 30 Units, Subcutaneous, Daily, MilJesus Genera  MD, 30 Units at 07/23/18 1000 .  insulin regular (NOVOLIN R,HUMULIN R) 100 Units in sodium chloride 0.9 % 100 mL (1 Units/mL) infusion, , Intravenous, Continuous, Simpson, Paula B, NP, Last Rate: 10.1 mL/hr at 07/23/18 1200 .  lactated ringers infusion, , Intravenous, Continuous, Belinda Block, MD, Last Rate: 10 mL/hr at 07/20/18 1500 .  MEDLINE mouth rinse, 15 mL, Mouth Rinse, 10 times per day, Aldean Jewett, MD, 15 mL at 07/23/18 1156 .  polyethylene glycol (MIRALAX / GLYCOLAX) packet 17 g, 17 g, Oral, Daily, Judith Part, MD, 17 g at 07/23/18 0959 .  polyvinyl alcohol (LIQUIFILM TEARS) 1.4 % ophthalmic solution 1 drop, 1 drop, Both Eyes, TID, Aljishi, Virgina Norfolk, MD, 1 drop at  07/23/18 1031 .  prednisoLONE acetate (PRED FORTE) 1 % ophthalmic suspension 2 drop, 2 drop, Both Eyes, TID AC & HS, Jun Osment, Joyice Faster, MD, 2 drop at 07/23/18 1156 .  primidone (MYSOLINE) tablet 50 mg, 50 mg, Per Tube, BID, Erick Colace, NP, 50 mg at 07/23/18 1001 .  [START ON 07/24/2018] rosuvastatin (CRESTOR) tablet 20 mg, 20 mg, Per Tube, Once per day on Tue Thu, Babcock, Peter E, NP .  senna-docusate (Senokot-S) tablet 2 tablet, 2 tablet, Oral, BID, Judith Part, MD, 2 tablet at 07/23/18 1000   Physical Exam: Intubated, sedated, eyes open spontaneously, PERRL, gaze conjugate, FCx4 reliably to command without preference Incision c/d/i  Assessment & Plan: 77 y.o. man s/p 4 level ACDF, recovering well immediately post-op with resolution of radicular pain and weakness, 9/19 code for asystole of unclear cause, CT C-sp no hematoma, CTH neg. -no changes in neurosurgical plan of care at this time, exam reassuring  Judith Part  07/23/18 1:45 PM

## 2018-07-23 NOTE — Progress Notes (Signed)
[facility] Progress Note Patient Name: Edward [name] DOB: 02/12/1941 MRN: [id number]   Date of Service  07/23/2018  HPI/Events of Note  Agitation - Request for bilateral soft wrist restraints.   eICU Interventions  Will order: 1. Bilateral soft wrist restraints.      Intervention Category Minor Interventions: Agitation / anxiety - evaluation and management  Sommer,Steven Eugene 07/23/2018, 1:12 AM

## 2018-07-23 NOTE — Progress Notes (Addendum)
Rcvd pg for Code Cool.  Ck'd w/ ED about pt also earlier in the evening, also present on 2H.  Spoke w/ 2H staff and family not present at the time and staff deferred chaplain presence at this time.  Myra Gianotti resident, [phone]

## 2018-07-23 NOTE — Progress Notes (Signed)
At shift change-out, responded to code blue.  Arrived at 4:56pm.  No family present.  Assisted medical resident in notifying pt's contact person.  Remained present to support medical staff.  Signed off at 5:40pm.  Myra Gianotti resident, [phone]

## 2018-07-23 NOTE — Progress Notes (Signed)
PT Cancellation Note  Patient Details Name: Edward [name] MRN: [id number] DOB: April 09, 1941   Cancelled Treatment:    Reason Eval/Treat Not Completed: Patient not medically ready   [name] B [name] 07/23/2018, 6:53 AM  Elwyn Reach, PT Acute Rehabilitation Services Pager: [phone] Office: [phone]

## 2018-07-23 NOTE — Progress Notes (Signed)
Patient transported from 2H14 to MRI and back without any complications.

## 2018-07-23 NOTE — Progress Notes (Signed)
OT Cancellation Note  Patient Details Name: Edward [name] MRN: [id number] DOB: Dec 19, 1940   Cancelled Treatment:    Reason Eval/Treat Not Completed: Medical issues which prohibited therapy(Intubated and sedated. Will return as schedule allows and pt medically ready. Thank you.)  [place]ke West, OTR/L Acute Rehab Pager: [phone] Office: [phone] 07/23/2018, 3:09 PM

## 2018-07-23 NOTE — Progress Notes (Signed)
NAME:  Edward [name], MRN:  [id number], DOB:  [birth year], LOS: 12 ADMISSION DATE:  07/13/2018, CONSULTATION DATE: 9/19 REFERRING MD:  Roylene Reason, CHIEF COMPLAINT:  Cardiac arrest    Brief History   77 year old patient with history of diabetes with history of difficult glycemic control and cervical stenosis resulting in cord compression underwent anterior cervical disc fusion of cervical spine on September 16.  Managed without steroids due to concerns of glycemic control challenge.  On 9/19 began to notice difficulty swallowing, not able to even swallow his own saliva.  Had been ordered Decadron but this was not given.  He was found by nursing staff unresponsive and in asystole.  He underwent 1 round of ACLS with successful return of spontaneous circulation. Tx to ICU for hypothermia protocol.   Significant Hospital Events   9/16: Anterior cervical disc fusion 9/62: Asystolic arrest.  Estimated time to return of spontaneous circulation approximately 3 minutes.  Unresponsive not following commands postarrest so hypothermia protocol initiated.  Goal temperature met at: 1030 pm  9/20: Hemodynamically stable on low-dose norepinephrine 9/21: completed rewarming. Paralytic and sedation discontinued. 9/22: paralysis/sedation and vasopressor therapy discontinued yesterday. Restlessness developed last night. IV fentanyl started. Reqired iv labetalol to treat HTN. Has a h/o HTN.  Consults: date of consult/date signed off & final recs:  Neuro 9/22>>> Cardiology 9/20>>> Procedures (surgical and bedside):  9/16: Anterior cervical disc fusion by neurosurgery 9/19: Oral endotracheal tube 9/19: Left internal jugular vein triple-lumen catheter>>> Significant Diagnostic Tests:  9/19: CT brain noncontrasted: Negative CT cervical spine 9/19: Status post C3-C7 ACDF with expected postoperative changes.  No fracture or malalignment MRI brain 9/23>>> 2D echo >>>Left ventricle: The cavity size was normal. There was  severe   concentric hypertrophy. Systolic function was vigorous. The   estimated ejection fraction was in the range of 65% to 70%. Wall   motion was normal; there were no regional wall motion   abnormalities. Doppler parameters are consistent with abnormal   left ventricular relaxation (grade 1 diastolic dysfunction).   Doppler parameters are consistent with elevated mean left atrial   filling pressure. - Mitral valve: Valve area by pressure half-time: 2.04 cm^2. - Left atrium: The atrium was mildly dilated. Micro Data:   Antimicrobials:     Subjective:  Hypertensive overnight.  Some intermittent agitation.  Following some simple commands but not reliably.  Objective   Blood pressure (!) 185/66, pulse 85, temperature 97.9 F (36.6 C), temperature source Core, resp. rate 14, height '5\' 8"'  (1.727 m), weight 132.5 kg, SpO2 96 %. CVP:  [5 mmHg-28 mmHg] 10 mmHg  Vent Mode: PRVC FiO2 (%):  [40 %] 40 % Set Rate:  [14 bmp] 14 bmp Vt Set:  [550 mL] 550 mL PEEP:  [5 cmH20] 5 cmH20 Plateau Pressure:  [17 cmH20-23 cmH20] 17 cmH20   Intake/Output Summary (Last 24 hours) at 07/23/2018 8366 Last data filed at 07/23/2018 0900 Gross per 24 hour  Intake 4003.64 ml  Output 3075 ml  Net 928.64 ml   Filed Weights   07/21/18 0430 07/22/18 0348 07/23/18 0417  Weight: 127.5 kg 127.3 kg 132.5 kg    Examination: General:  Chronically ill appearing male, NAD on vent  HEENT: MM pink/moist, ETT Neuro:  Opens eyes, followed commands for me briefly (thumbs up, wiggled toes, stuck out tongue), did not repeat for RN, PERRL CV: s1s2 rrr, no m/r/g PULM: even/non-labored, lungs bilaterally coarse  QH:UTML, non-tender, bsx4 active  Extremities: warm/dry, mild generalized edema  Skin: no rashes  or lesions   Resolved Hospital Problem list    Assessment & Plan:  Status post asystolic cardiac arrest.  Presumed hypoxia mediate given pmh OSAand report of inadequate airway protection pre-arrest, swollen  tongue so wonder about adverse reaction to medications??  Does have a history of coronary artery disease  H/O coronary artery disease and CABG back in 2019 with chronic diastolic heart failure HTN  Plan Continue telemetry monitoring Cardiology recommends no cardiac workup, suspect arrest due to respiratory cause. Continue crestor and asa. 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 -Initial CT imaging negative Plan Neuro following  Continue precedex - wean as able  MRI today  Continue fentanyl gtt - wean as able    Chronic kidney disease stage III Plan F/u chem  Strict I/O  CVP 10   Diabetes   Plan  Continue insulin gtt -- Goal is 140-1885mdl  Status post anterior cervical disc fusion 9/16 Plan Decadron as above Routine surgical site assessment nsgy following    Disposition / Summary of Today's Plan 07/23/18   See above    Diet: enteral feeding Pain/Anxiety/Delirium protocol (if indicated): PAD protocol VAP protocol (if indicated) ordered 9/20 DVT prophylaxis: low molecular weight heparin  GI prophylaxis: H2 blockade Hyperglycemia protocol: Hyperglycemia protocol Mobility: Bedrest Code Status: Full code Family Communication: no family present on NP rounds 9/23  Labs   CBC: Recent Labs  Lab 07/16/18 1818 07/19/18 1716 07/19/18 1901 07/19/18 2050 07/19/18 2305 07/20/18 0110 07/21/18 0407  WBC 10.8* 10.2  --   --   --   --  14.6*  NEUTROABS  --  7.4  --   --   --   --   --   HGB 13.7 18.1* 13.9 13.9 13.9 13.6 13.3  HCT 43.4 57.3* 41.0 41.0 41.0 40.0 41.2  MCV 89.9 89.0  --   --   --   --  86.4  PLT 244 145*  --   --   --   --  25850 Basic  Metabolic Panel: Recent Labs  Lab 07/19/18 1716  07/20/18 1119 07/20/18 1638 07/20/18 2058  07/21/18 0407  07/21/18 1617  07/22/18 0437  07/22/18 1137 07/22/18 1550 07/22/18 2000 07/23/18 0014 07/23/18 0330  NA 137   < > 140 141 139   < > 140   < > 141   < > 142   < > 141 143 143 142 143  K 4.0   < > 4.0 4.1 3.8   < > 3.5   < > 4.0   < > 4.3   < > 4.4 4.3 4.6 4.4 4.2  CL 95*   < > 104 106 105   < > 106   < > 107   < > 107   < > 107 109 109 109 110  CO2 26   < > '27 25 25   ' < > 27   < > 26   < > 27   < > '26 27 27 24 26  ' GLUCOSE 318*   < > 160* 177* 261*   < > 217*   < > 125*   < > 173*   < >  189* 124* 162* 275* 274*  BUN 15   < > '12 11 13   ' < > 14   < > 21   < > 32*   < > 37* 40* 36* 38* 38*  CREATININE 1.30*   < > 0.72 0.76 0.79   < > 0.87   < > 1.29*   < > 1.44*   < > 1.43* 1.36* 1.33* 1.27* 1.22  CALCIUM 8.6*   < > 8.3* 8.0* 8.0*   < > 7.9*   < > 7.8*   < > 7.7*   < > 7.7* 7.3* 7.4* 7.2* 7.5*  MG 1.8  --  1.8 1.8  --   --  2.0  --  2.0  --   --   --   --   --   --   --   --   PHOS  --   --  1.3*  --  3.4  --  2.6  --  3.5  --  4.0  --   --   --   --   --   --    < > = values in this interval not displayed.   GFR: Estimated Creatinine Clearance: 68.5 mL/min (by C-G formula based on SCr of 1.22 mg/dL). Recent Labs  Lab 07/16/18 1818 07/19/18 1716 07/21/18 0407  WBC 10.8* 10.2 14.6*  LATICACIDVEN  --  5.7*  --    Liver Function Tests: Recent Labs  Lab 07/19/18 1716  AST 135*  ALT 105*  ALKPHOS 89  BILITOT 1.0  PROT 6.6  ALBUMIN 3.2*   No results for input(s): LIPASE, AMYLASE in the last 168 hours. No results for input(s): AMMONIA in the last 168 hours. ABG    Component Value Date/Time   PHART 7.426 07/20/2018 0908   PCO2ART 43.1 07/20/2018 0908   PO2ART 126.0 (H) 07/20/2018 0908   HCO3 29.7 (H) 07/20/2018 0908   TCO2 31 07/20/2018 0908   ACIDBASEDEF 2.0 11/09/2016 2003   O2SAT 99.0 07/20/2018 0908    Coagulation Profile: Recent Labs  Lab 07/19/18 1858  07/20/18 0312  INR 1.16 1.20   Cardiac Enzymes: Recent Labs  Lab 07/19/18 1716 07/19/18 2314 07/20/18 0402 07/20/18 1119  TROPONINI <0.03 0.30* 0.25* 0.16*   HbA1C: Hgb A1c MFr Bld  Date/Time Value Ref Range Status  06/19/2018 10:56 AM 9.5 (H) 4.6 - 6.5 % Final    Comment:    Glycemic Control Guidelines for People with Diabetes:Non Diabetic:  <6%Goal of Therapy: <7%Additional Action Suggested:  >8%   03/13/2018 10:53 AM 10.2 (H) 4.6 - 6.5 % Final    Comment:    Glycemic Control Guidelines for People with Diabetes:Non Diabetic:  <6%Goal of Therapy: <7%Additional Action Suggested:  >8%    CBG: Recent Labs  Lab 07/23/18 0402 07/23/18 0509 07/23/18 0615 07/23/18 0729 07/23/18 0854  GLUCAP 255* 230* 191* 200* 200*      Nickolas Madrid, NP 07/23/2018  9:23 AM Pager: [phone] or [phone]

## 2018-07-23 NOTE — Progress Notes (Signed)
Patient has positive cuff leak.

## 2018-07-24 ENCOUNTER — Inpatient Hospital Stay (HOSPITAL_COMMUNITY): Payer: Medicare Other

## 2018-07-24 LAB — GLUCOSE, CAPILLARY
GLUCOSE-CAPILLARY: 143 mg/dL — AB (ref 70–99)
GLUCOSE-CAPILLARY: 144 mg/dL — AB (ref 70–99)
GLUCOSE-CAPILLARY: 145 mg/dL — AB (ref 70–99)
GLUCOSE-CAPILLARY: 155 mg/dL — AB (ref 70–99)
GLUCOSE-CAPILLARY: 155 mg/dL — AB (ref 70–99)
GLUCOSE-CAPILLARY: 168 mg/dL — AB (ref 70–99)
GLUCOSE-CAPILLARY: 180 mg/dL — AB (ref 70–99)
GLUCOSE-CAPILLARY: 195 mg/dL — AB (ref 70–99)
GLUCOSE-CAPILLARY: 196 mg/dL — AB (ref 70–99)
Glucose-Capillary: 227 mg/dL — ABNORMAL HIGH (ref 70–99)
Glucose-Capillary: 184 mg/dL — ABNORMAL HIGH (ref 70–99)
Glucose-Capillary: 178 mg/dL — ABNORMAL HIGH (ref 70–99)
Glucose-Capillary: 164 mg/dL — ABNORMAL HIGH (ref 70–99)
Glucose-Capillary: 150 mg/dL — ABNORMAL HIGH (ref 70–99)
Glucose-Capillary: 124 mg/dL — ABNORMAL HIGH (ref 70–99)
Glucose-Capillary: 188 mg/dL — ABNORMAL HIGH (ref 70–99)
Glucose-Capillary: 170 mg/dL — ABNORMAL HIGH (ref 70–99)
Glucose-Capillary: 112 mg/dL — ABNORMAL HIGH (ref 70–99)
Glucose-Capillary: 120 mg/dL — ABNORMAL HIGH (ref 70–99)
Glucose-Capillary: 117 mg/dL — ABNORMAL HIGH (ref 70–99)
Glucose-Capillary: 166 mg/dL — ABNORMAL HIGH (ref 70–99)

## 2018-07-24 LAB — BASIC METABOLIC PANEL
Anion gap: 6 (ref 5–15)
Anion gap: 7 (ref 5–15)
Anion gap: 8 (ref 5–15)
Anion gap: 8 (ref 5–15)
BUN: 30 mg/dL — AB (ref 8–23)
BUN: 31 mg/dL — AB (ref 8–23)
BUN: 32 mg/dL — ABNORMAL HIGH (ref 8–23)
BUN: 33 mg/dL — AB (ref 8–23)
CALCIUM: 7.9 mg/dL — AB (ref 8.9–10.3)
CHLORIDE: 108 mmol/L (ref 98–111)
CHLORIDE: 109 mmol/L (ref 98–111)
CHLORIDE: 110 mmol/L (ref 98–111)
CO2: 28 mmol/L (ref 22–32)
CO2: 28 mmol/L (ref 22–32)
CO2: 28 mmol/L (ref 22–32)
CO2: 29 mmol/L (ref 22–32)
CREATININE: 1.08 mg/dL (ref 0.61–1.24)
CREATININE: 1.12 mg/dL (ref 0.61–1.24)
Calcium: 7.8 mg/dL — ABNORMAL LOW (ref 8.9–10.3)
Calcium: 7.9 mg/dL — ABNORMAL LOW (ref 8.9–10.3)
Calcium: 8 mg/dL — ABNORMAL LOW (ref 8.9–10.3)
Chloride: 110 mmol/L (ref 98–111)
Creatinine, Ser: 1.05 mg/dL (ref 0.61–1.24)
Creatinine, Ser: 1.06 mg/dL (ref 0.61–1.24)
GFR calc Af Amer: >60 mL/min (ref >60)
GFR calc Af Amer: >60 mL/min (ref >60)
GFR calc Af Amer: >60 mL/min (ref >60)
GFR calc non Af Amer: >60 mL/min (ref >60)
GFR calc non Af Amer: >60 mL/min (ref >60)
GLUCOSE: 217 mg/dL — AB (ref 70–99)
GLUCOSE: 200 mg/dL — AB (ref 70–99)
GLUCOSE: 191 mg/dL — AB (ref 70–99)
Glucose, Bld: 142 mg/dL — ABNORMAL HIGH (ref 70–99)
POTASSIUM: 4 mmol/L (ref 3.5–5.1)
POTASSIUM: 4.2 mmol/L (ref 3.5–5.1)
Potassium: 3.9 mmol/L (ref 3.5–5.1)
Potassium: 4 mmol/L (ref 3.5–5.1)
SODIUM: 144 mmol/L (ref 135–145)
SODIUM: 146 mmol/L — AB (ref 135–145)
Sodium: 145 mmol/L (ref 135–145)
Sodium: 144 mmol/L (ref 135–145)

## 2018-07-24 LAB — CBC
HCT: 36.6 % — ABNORMAL LOW (ref 39.0–52.0)
Hemoglobin: 11.6 g/dL — ABNORMAL LOW (ref 13.0–17.0)
MCH: 28.4 pg (ref 26.0–34.0)
MCHC: 31.7 g/dL (ref 30.0–36.0)
MCV: 89.5 fL (ref 78.0–100.0)
PLATELETS: 209 10*3/uL (ref 150–400)
RBC: 4.09 MIL/uL — ABNORMAL LOW (ref 4.22–5.81)
RDW: 13.9 % (ref 11.5–15.5)
WBC: 18.5 10*3/uL — ABNORMAL HIGH (ref 4.0–10.5)

## 2018-07-24 MED ORDER — IBUPROFEN 100 MG/5ML PO SUSP
400.0000 mg | Freq: Once | ORAL | Status: AC
  Administered 2018-07-24: 400 mg
  Filled 2018-07-24: qty 20

## 2018-07-24 MED ORDER — PROPRANOLOL HCL 40 MG PO TABS
40.0000 mg | ORAL_TABLET | Freq: Two times a day (BID) | ORAL | Status: DC
  Administered 2018-07-24 – 2018-07-28 (×7): 40 mg via ORAL
  Filled 2018-07-24 (×10): qty 1

## 2018-07-24 MED ORDER — VANCOMYCIN HCL 10 G IV SOLR
2000.0000 mg | Freq: Once | INTRAVENOUS | Status: AC
  Administered 2018-07-24: 2000 mg via INTRAVENOUS
  Filled 2018-07-24: qty 2000

## 2018-07-24 MED ORDER — PIPERACILLIN-TAZOBACTAM 3.375 G IVPB 30 MIN
3.3750 g | Freq: Once | INTRAVENOUS | Status: DC
  Filled 2018-07-24: qty 50

## 2018-07-24 MED ORDER — FUROSEMIDE 10 MG/ML IJ SOLN
40.0000 mg | Freq: Every day | INTRAMUSCULAR | Status: DC
  Administered 2018-07-24 – 2018-07-28 (×5): 40 mg via INTRAVENOUS
  Filled 2018-07-24 (×5): qty 4

## 2018-07-24 MED ORDER — HYDRALAZINE HCL 25 MG PO TABS
25.0000 mg | ORAL_TABLET | Freq: Three times a day (TID) | ORAL | Status: DC
  Administered 2018-07-24 – 2018-07-26 (×5): 25 mg
  Filled 2018-07-24 (×6): qty 1

## 2018-07-24 MED ORDER — VANCOMYCIN HCL IN DEXTROSE 1-5 GM/200ML-% IV SOLN: 1000.0000 mg | Freq: Two times a day (BID) | INTRAVENOUS | Status: DC

## 2018-07-24 MED ORDER — MORPHINE SULFATE (PF) 2 MG/ML IV SOLN
1.0000 mg | INTRAVENOUS | Status: DC | PRN
  Administered 2018-07-24: 1 mg via INTRAVENOUS
  Administered 2018-07-25: 2 mg via INTRAVENOUS
  Administered 2018-07-26: 1 mg via INTRAVENOUS
  Administered 2018-07-27 – 2018-07-28 (×4): 2 mg via INTRAVENOUS
  Filled 2018-07-24 (×7): qty 1

## 2018-07-24 MED ORDER — SODIUM CHLORIDE 3 % IN NEBU
4.0000 mL | INHALATION_SOLUTION | Freq: Two times a day (BID) | RESPIRATORY_TRACT | Status: AC
  Administered 2018-07-24 – 2018-07-27 (×6): 4 mL via RESPIRATORY_TRACT
  Filled 2018-07-24 (×7): qty 4

## 2018-07-24 MED ORDER — PIPERACILLIN-TAZOBACTAM 3.375 G IVPB
3.3750 g | Freq: Three times a day (TID) | INTRAVENOUS | Status: DC
  Administered 2018-07-24 – 2018-07-26 (×7): 3.375 g via INTRAVENOUS
  Filled 2018-07-24 (×8): qty 50

## 2018-07-24 NOTE — Progress Notes (Addendum)
NAME:  Edward [name], MRN:  [id number], DOB:  1941-08-13, LOS: 44 ADMISSION DATE:  07/13/2018, CONSULTATION DATE: 9/19 REFERRING MD:  Roylene Reason, CHIEF COMPLAINT:  Cardiac arrest    Brief History   77 year old patient with history of diabetes with history of difficult glycemic control and cervical stenosis resulting in cord compression underwent anterior cervical disc fusion of cervical spine on September 16.  Managed without steroids due to concerns of glycemic control challenge.  On 9/19 began to notice difficulty swallowing, not able to even swallow his own saliva.  Had been ordered Decadron but this was not given.  He was found by nursing staff unresponsive and in asystole.  He underwent 1 round of ACLS with successful return of spontaneous circulation. Tx to ICU for hypothermia protocol.   Significant Hospital Events   9/16: Anterior cervical disc fusion 7/98: Asystolic arrest.  Estimated time to return of spontaneous circulation approximately 3 minutes.  Unresponsive not following commands postarrest so hypothermia protocol initiated.  Goal temperature met at: 1030 pm  9/20: Hemodynamically stable on low-dose norepinephrine 9/21: completed rewarming. Paralytic and sedation discontinued. 9/22: paralysis/sedation and vasopressor therapy discontinued yesterday. Restlessness developed last night. IV fentanyl started. Reqired iv labetalol to treat HTN. Has a h/o HTN. 9/24>> Developed fever Vanc/Zosyn started  Consults: date of consult/date signed off & final recs:  Neuro 9/22>>> Cardiology 9/20>>> Procedures (surgical and bedside):  9/16: Anterior cervical disc fusion by neurosurgery 9/19: Oral endotracheal tube 9/19: Left internal jugular vein triple-lumen catheter>>> Significant Diagnostic Tests:  9/23 MRI Head Caudate and lentiform nuclei increased T2 signal and heterogeneous reduced diffusion compatible with hypoxic ischemic injury of the brain in the setting of cardiac arrest. No  hemorrhage, mass effect, or herniation 9/23 MRI cervical spine: No acute osseous or cord signal abnormality identified. Interval C3-C7 anterior cervical fusion and discectomy. Small prevertebral fluid collection extending into the right anterior neck compatible with postsurgical changes. No epidural collection. Mildly improved patency of the cervical spinal canal. Stable residual multilevel foraminal stenosis from uncovertebral and facet Hypertrophy. 9/23 MRI thoracic spine Mild thoracic spine spondylosis. No high-grade foraminal or canal Stenosis 9/19: CT brain noncontrasted: Negative CT cervical spine 9/19: Status post C3-C7 ACDF with expected postoperative changes.  No fracture or malalignment 2D echo 9/20 >>>Left ventricle: The cavity size was normal. There was severe   concentric hypertrophy. Systolic function was vigorous. The   estimated ejection fraction was in the range of 65% to 70%. Wall   motion was normal; there were no regional wall motion   abnormalities. Doppler parameters are consistent with abnormal   left ventricular relaxation (grade 1 diastolic dysfunction).   Doppler parameters are consistent with elevated mean left atrial   filling pressure. - Mitral valve: Valve area by pressure half-time: 2.04 cm^2. - Left atrium: The atrium was mildly dilated. Micro Data:  9/24>>Sputum >> GS>> Abundant WBC Moderate Gram + cocci, Few gram - rods  Antimicrobials:  Vanc 9/24>> Zosyn 9/24>>  Subjective:  Remains hypertensive , on insulin gtt,   Sedated with fentanyl and precedex.  Following some simple commands but not consistently.  Objective   Blood pressure (!) 132/58, pulse 93, temperature 98.8 F (37.1 C), resp. rate 15, height _0  (1.727 m), weight 132.5 kg, SpO2 100 %. CVP:  [8 mmHg-12 mmHg] 11 mmHg  Vent Mode: PSV;CPAP FiO2 (%):  [40 %] 40 % Set Rate:  [14 bmp] 14 bmp Vt Set:  [550 mL] 550 mL PEEP:  [5 cmH20] 5 cmH20 Pressure  Support:  [5 [place] Pressure:  [13 cmH20-22 cmH20] 15 cmH20   Intake/Output Summary (Last 24 hours) at 07/24/2018 0935 Last data filed at 07/24/2018 9211 Gross per 24 hour  Intake 1596.95 ml  Output 5575 ml  Net -3978.05 ml   Filed Weights   07/22/18 0348 07/23/18 0417 07/24/18 0600  Weight: 127.3 kg 132.5 kg 132.5 kg    Examination: General:  Chronically ill appearing male, NAD sedated and intubated HEENT: NCAT, ETT, OG tube MM pink/moist, ETT Neuro:  Sedated, Opens eyes to pain, did not follow commands for me but has been following commands at intervals for nursing,PERRL 2 and sluggish CV: s1s2 rrr, no m/r/g, NSR PULM: Bilateral chest excursion,ungs bilaterally coarse , diminished per bases HE:RDEY, non-tender, bsx4 active, obese  Extremities: warm/dry, mild generalized edema, no obvious deformities  Skin: no rashes or lesions, warm , dry and intact   Resolved Hospital Problem list    Assessment & Plan:  Status post asystolic cardiac arrest.  Presumed hypoxia mediate given pmh OSA and report of inadequate airway protection pre-arrest, swollen tongue so wonder about adverse reaction to medications??   Does have a history of coronary artery disease  H/O coronary artery disease and CABG back in 2019 with chronic diastolic heart failure HTN  Plan Continue telemetry monitoring Cardiology recommends no cardiac workup, suspect arrest due to respiratory cause. Continue crestor and asa. Resume home norvasc 9/23 Add  Lasix 42m IV daily  as tolerated well 9/23, and remains hypertensive and Net + 5L  Ventilator dependence status post cardiopulmonary arrest H/o OSA  Plan Vent support - 8cc/kg  Trend CXR  Trend ABG prn Continue scheduled decadron for now with concern for swelling/airway compromise (started by nsgy, ?wean) Daily SBT>> weaning 5/5    ID ? 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 Plan Neuro following  Continue precedex - wean as able  Continue fentanyl gtt - wean as able  Follow up imaging as needed   Chronic kidney disease stage III Creatinine stable after lasix x 1 dose Plan F/u chem  Strict I/O  CVP monitoring   Diabetes   Plan  CBG Q 1 Continue insulin gtt -- Goal is 140-1835mdl Trend BMET  Status post anterior cervical disc fusion 9/16 Plan Decadron as above Routine surgical site assessment nsgy following    Disposition / Summary of Today's Plan 07/24/18   See above    Diet: enteral feeding Pain/Anxiety/Delirium protocol (if indicated): PAD protocol VAP protocol (if indicated) ordered 9/20 DVT prophylaxis: low molecular weight heparin  GI prophylaxis: H2 blockade Hyperglycemia protocol: Hyperglycemia protocol Mobility: Bedrest Code Status: Full code Family Communication: no family present on NP rounds 9/23  Labs   CBC: Recent Labs  Lab 07/19/18 1716  07/19/18 2050 07/19/18 2305 07/20/18 0110 07/21/18 0407 07/24/18 0422  WBC 10.2  --   --   --   --  14.6* 18.5*  NEUTROABS 7.4  --   --   --   --   --   --  HGB 18.1*   < > 13.9 13.9 13.6 13.3 11.6*  HCT 57.3*   < > 41.0 41.0 40.0 41.2 36.6*  MCV 89.0  --   --   --   --  86.4 89.5  PLT 145*  --   --   --   --  259 209   < > = values in this interval not displayed.   Basic Metabolic Panel: Recent Labs  Lab 07/19/18 1716  07/20/18 1119 07/20/18 1638 07/20/18 2058  07/21/18 0407  07/21/18 1617  07/22/18 0437  07/23/18 1207 07/23/18 1615 07/23/18 2046  07/23/18 2353 07/24/18 0422  NA 137   < > 140 141 139   < > 140   < > 141   < > 142   < > 142 145 146* 144 145  K 4.0   < > 4.0 4.1 3.8   < > 3.5   < > 4.0   < > 4.3   < > 3.7 3.7 4.0 4.0 3.9  CL 95*   < > 104 106 105   < > 106   < > 107   < > 107   < > 105 107 109 108 110  CO2 26   < > _0 < > 27   < > 26   < > 27   < > _1 GLUCOSE 318*   < > 160* 177* 261*   < > 217*   < > 125*   < > 173*   < > 157* 119* 142* 217* 200*  BUN 15   < > _2 < > 14   < > 21   < > 32*   < > 36* 34* 31* 30* 33*  CREATININE 1.30*   < > 0.72 0.76 0.79   < > 0.87   < > 1.29*   < > 1.44*   < > 1.04 1.09 1.05 1.08 1.12  CALCIUM 8.6*   < > 8.3* 8.0* 8.0*   < > 7.9*   < > 7.8*   < > 7.7*   < > 7.9* 8.1* 7.9* 7.8* 8.0*  MG 1.8  --  1.8 1.8  --   --  2.0  --  2.0  --   --   --   --   --   --   --   --   PHOS  --   --  1.3*  --  3.4  --  2.6  --  3.5  --  4.0  --   --   --   --   --   --    < > = values in this interval not displayed.   GFR: Estimated Creatinine Clearance: 74.6 mL/min (by C-G formula based on SCr of 1.12 mg/dL). Recent Labs  Lab 07/19/18 1716 07/21/18 0407 07/24/18 0422  WBC 10.2 14.6* 18.5*  LATICACIDVEN 5.7*  --   --    Liver Function Tests: Recent Labs  Lab 07/19/18 1716  AST 135*  ALT 105*  ALKPHOS 89  BILITOT 1.0  PROT 6.6  ALBUMIN 3.2*   No results for input(s): LIPASE, AMYLASE in the last 168 hours. No results for input(s): AMMONIA in the last 168 hours. ABG    Component Value Date/Time   PHART 7.426 07/20/2018 0908   PCO2ART 43.1 07/20/2018 0908   PO2ART 126.0 (H) 07/20/2018 0908   HCO3 29.7 (H)  07/20/2018 0908   TCO2 31 07/20/2018 0908   ACIDBASEDEF 2.0 11/09/2016 2003   O2SAT 99.0 07/20/2018 0908    Coagulation Profile: Recent Labs  Lab 07/19/18 1858 07/20/18 0312  INR 1.16 1.20   Cardiac Enzymes: Recent Labs  Lab 07/19/18 1716 07/19/18 2314 07/20/18 0402 07/20/18 1119  TROPONINI <0.03 0.30* 0.25* 0.16*   HbA1C: Hgb A1c MFr Bld   Date/Time Value Ref Range Status  06/19/2018 10:56 AM 9.5 (H) 4.6 - 6.5 % Final    Comment:    Glycemic Control Guidelines for People with Diabetes:Non Diabetic:  <6%Goal of Therapy: <7%Additional Action Suggested:  >8%   03/13/2018 10:53 AM 10.2 (H) 4.6 - 6.5 % Final    Comment:    Glycemic Control Guidelines for People with Diabetes:Non Diabetic:  <6%Goal of Therapy: <7%Additional Action Suggested:  >8%    CBG: Recent Labs  Lab 07/24/18 0425 07/24/18 0528 07/24/18 0616 07/24/18 0738 07/24/18 0844  GLUCAP 178* 164* 150* 124* [place], AGACNP-BC Maryanna Shape Pulmonary Critical Care Pager [phone] 07/24/2018  9:35 AM

## 2018-07-24 NOTE — Progress Notes (Signed)
PT Cancellation Note  Patient Details Name: Edward [name] MRN: [id number] DOB: [birth year]   Cancelled Treatment:    Reason Eval/Treat Not Completed: Patient not medically ready. Pt remains intubated, restrained, and sedated. RN reports they are lifting sedation to do a neuro assessment in attempt to possibly extubate today. Acute PT to return as able, as appropriate.  Kittie Plater, PT, DPT Acute Rehabilitation Services Pager #: [phone] Office #: [phone]    Berline Lopes 07/24/2018, 10:07 AM

## 2018-07-24 NOTE — Progress Notes (Addendum)
Inpatient Diabetes Program Recommendations  AACE/ADA: New Consensus Statement on Inpatient Glycemic Control (2015)  Target Ranges:  Prepandial:   less than 140 mg/dL      Peak postprandial:   less than 180 mg/dL (1-2 hours)      Critically ill patients:  140 - 180 mg/dL   Lab Results  Component Value Date   GLUCAP 168 (H) 07/24/2018   HGBA1C 9.5 (H) 06/19/2018    Review of Glycemic Control Results for Edward [name], Edward [name] (MRN [id number]) as of 07/24/2018 11:37  Ref. Range 07/24/2018 07:38 07/24/2018 08:44 07/24/2018 09:50 07/24/2018 10:29  Glucose-Capillary Latest Ref Range: 70 - 99 mg/dL 124 (H) 188 (H) 170 (H) 168 (H)   Diabetes history: Type 2 DM Outpatient Diabetes medications: NPH 30 units BID, Novolog 40 units TID Current orders for Inpatient glycemic control: Levemir 30 units QD Decadron 2 mg Q8H  Inpatient Diabetes Program Recommendations:     Addendum: Averaging around 5-6 units per hour. Spoke with RN, to plan for extubation tomorrow. Not to transition off of insulin drip today. Noted patient has started on basal insulin. It is important for transition that transition doses reflect amount of Levemir received. Will continue to follow.  Thanks, Bronson Curb, MSN, RNC-OB Diabetes Coordinator [phone] (8a-5p)

## 2018-07-24 NOTE — Progress Notes (Signed)
[facility] Progress Note Patient Name: Edward [name] DOB: Sep 28, 1941 MRN: [id number]   Date of Service  07/24/2018  HPI/Events of Note  Fever to 100.9 F and WBC = 18.5 - Request for Tylenol. AST and ALT both elevated, therefore, can't have Tylenol. Creatinine = 1.12.  eICU Interventions  Will order: 1. Blood cultures X 2 now. 2. Tracheal aspirate culture now.  3. Motrin 400 mg per tube now.  4. Vancomycin and Zosyn per pharmacy consult.        Abdelrahman Nair [place] 07/24/2018, 6:09 AM

## 2018-07-24 NOTE — Progress Notes (Signed)
Neurosurgery Service Progress Note  Subjective: NAE ON, still intubated but FCx4  Objective: Vitals:   07/24/18 1200 07/24/18 1300 07/24/18 1500 07/24/18 1600  BP: (!) 148/59 (!) 153/64 (!) 170/68 (!) 162/67  Pulse: 61 83 83 81  Resp: (!) '8 15 18 20  ' Temp: 98.6 F (37 C) 98.6 F (37 C) 99 F (37.2 C) 99.1 F (37.3 C)  TempSrc:      SpO2: 100% 100% 100% 100%  Weight:      Height:       Temp (24hrs), Avg:99.7 F (37.6 C), Min:98.2 F (36.8 C), Max:100.9 F (38.3 C)  CBC Latest Ref Rng & Units 07/24/2018 07/21/2018 07/20/2018  WBC 4.0 - 10.5 K/uL 18.5(H) 14.6(H) -  Hemoglobin 13.0 - 17.0 g/dL 11.6(L) 13.3 13.6  Hematocrit 39.0 - 52.0 % 36.6(L) 41.2 40.0  Platelets 150 - 400 K/uL 209 259 -   BMP Latest Ref Rng & Units 07/24/2018 07/24/2018 07/23/2018  Glucose 70 - 99 mg/dL 191(H) 200(H) 217(H)  BUN 8 - 23 mg/dL 32(H) 33(H) 30(H)  Creatinine 0.61 - 1.24 mg/dL 1.06 1.12 1.08  BUN/Creat Ratio 10 - 24 - - -  Sodium 135 - 145 mmol/L 144 145 144  Potassium 3.5 - 5.1 mmol/L 4.2 3.9 4.0  Chloride 98 - 111 mmol/L 110 110 108  CO2 22 - 32 mmol/L '28 28 28  ' Calcium 8.9 - 10.3 mg/dL 7.9(L) 8.0(L) 7.8(L)    Intake/Output Summary (Last 24 hours) at 07/24/2018 1628 Last data filed at 07/24/2018 1627 Gross per 24 hour  Intake 1677.68 ml  Output 5320 ml  Net -3642.32 ml    Current Facility-Administered Medications:  .  0.9 %  sodium chloride infusion, , Intravenous, Continuous, Aljishi, Virgina Norfolk, MD, Stopped at 07/23/18 2113 .  amLODipine (NORVASC) tablet 10 mg, 10 mg, Per Tube, Daily, Aldean Jewett, MD, 10 mg at 07/24/18 0923 .  chlorhexidine gluconate (MEDLINE KIT) (PERIDEX) 0.12 % solution 15 mL, 15 mL, Mouth Rinse, BID, Aljishi, Virgina Norfolk, MD, 15 mL at 07/24/18 0823 .  dexamethasone (DECADRON) injection 2 mg, 2 mg, Intravenous, Q8H, Tegan Britain A, MD, 2 mg at 07/24/18 1603 .  dexmedetomidine (PRECEDEX) 400 MCG/100ML (4 mcg/mL) infusion, 0.4-1.7 mcg/kg/hr, Intravenous, Titrated,  Anders Simmonds, MD, Last Rate: 12.73 mL/hr at 07/24/18 1055, 0.4 mcg/kg/hr at 07/24/18 1055 .  dicyclomine (BENTYL) 10 MG/5ML syrup 10 mg, 10 mg, Per Tube, BID, Aldean Jewett, MD, 10 mg at 07/24/18 0922 .  enoxaparin (LOVENOX) injection 40 mg, 40 mg, Subcutaneous, Q24H, Erick Colace, NP, 40 mg at 07/24/18 0943 .  erythromycin ophthalmic ointment 1 application, 1 application, Both Eyes, Daily PRN, Judith Part, MD .  famotidine (PEPCID) IVPB 20 mg premix, 20 mg, Intravenous, Q12H, Aljishi, Virgina Norfolk, MD, Last Rate: 100 mL/hr at 07/24/18 1101, 20 mg at 07/24/18 1101 .  feeding supplement (PRO-STAT SUGAR FREE 64) liquid 30 mL, 30 mL, Per Tube, BID, Erick Colace, NP, 30 mL at 07/24/18 0922 .  feeding supplement (VITAL HIGH PROTEIN) liquid 1,000 mL, 1,000 mL, Per Tube, Q24H, Erick Colace, NP, 1,000 mL at 07/24/18 0922 .  fentaNYL (SUBLIMAZE) bolus via infusion 25 mcg, 25 mcg, Intravenous, Q1H PRN, Omar Person, NP, 25 mcg at 07/24/18 1165 .  fentaNYL 2569mg in NS 2521m(1013mml) infusion-PREMIX, 25-400 mcg/hr, Intravenous, Continuous, Eubanks, Katalina M, NP, Stopped at 07/24/18 1000 .  furosemide (LASIX) injection 40 mg, 40 mg, Intravenous, Daily, AlvRigoberto NoelD, 40 mg at 07/24/18  1018 .  hydrALAZINE (APRESOLINE) injection 10-20 mg, 10-20 mg, Intravenous, Q6H PRN, Rigoberto Noel, MD, 20 mg at 07/24/18 3838 .  hydrALAZINE (APRESOLINE) tablet 25 mg, 25 mg, Per Tube, Q8H, Rigoberto Noel, MD, 25 mg at 07/24/18 1018 .  insulin detemir (LEVEMIR) injection 30 Units, 30 Units, Subcutaneous, Daily, Jesus Genera, MD, 30 Units at 07/24/18 [phone] .  insulin regular (NOVOLIN R,HUMULIN R) 100 Units in sodium chloride 0.9 % 100 mL (1 Units/mL) infusion, , Intravenous, Continuous, Simpson, Paula B, NP, Last Rate: 4.9 mL/hr at 07/24/18 1230 .  labetalol (NORMODYNE,TRANDATE) injection 10 mg, 10 mg, Intravenous, Q2H PRN, Aldean Jewett, MD, 10 mg at 07/24/18 1019 .  lactated ringers  infusion, , Intravenous, Continuous, Belinda Block, MD, Last Rate: 10 mL/hr at 07/20/18 1500 .  MEDLINE mouth rinse, 15 mL, Mouth Rinse, 10 times per day, Aldean Jewett, MD, 15 mL at 07/24/18 1603 .  piperacillin-tazobactam (ZOSYN) IVPB 3.375 g, 3.375 g, Intravenous, Once, Laren Everts, RPH .  piperacillin-tazobactam (ZOSYN) IVPB 3.375 g, 3.375 g, Intravenous, Q8H, Bryk, Veronda P, RPH, Last Rate: 12.5 mL/hr at 07/24/18 1208, 3.375 g at 07/24/18 1208 .  polyethylene glycol (MIRALAX / GLYCOLAX) packet 17 g, 17 g, Oral, Daily, Judith Part, MD, 17 g at 07/24/18 3754 .  polyvinyl alcohol (LIQUIFILM TEARS) 1.4 % ophthalmic solution 1 drop, 1 drop, Both Eyes, TID, Aljishi, Virgina Norfolk, MD, 1 drop at 07/24/18 1603 .  prednisoLONE acetate (PRED FORTE) 1 % ophthalmic suspension 2 drop, 2 drop, Both Eyes, TID AC & HS, Talayah Picardi, Joyice Faster, MD, 2 drop at 07/24/18 1603 .  primidone (MYSOLINE) tablet 50 mg, 50 mg, Per Tube, BID, Erick Colace, NP, 50 mg at 07/24/18 0924 .  propranolol (INDERAL) tablet 40 mg, 40 mg, Oral, BID, Rigoberto Noel, MD, 40 mg at 07/24/18 1018 .  rosuvastatin (CRESTOR) tablet 20 mg, 20 mg, Per Tube, Once per day on Tue Thu, Babcock, Peter E, NP, 20 mg at 07/24/18 0929 .  senna-docusate (Senokot-S) tablet 2 tablet, 2 tablet, Oral, BID, Judith Part, MD, 2 tablet at 07/24/18 3606   Physical Exam: Intubated, sedated, eyes open spontaneously, PERRL, gaze conjugate, FCx4 reliably to command without preference Incision c/d/i, neck soft, no evidence of hematoma  Assessment & Plan: 77 y.o. man s/p 4 level ACDF, recovering well immediately post-op with resolution of radicular pain and weakness, 9/19 code for asystole of unclear cause, CT C-sp no hematoma, CTH neg. MRI C-spine shows no evidence of epidural hematoma, minimal soft tissue swelling, no hematoma, good canal decompression. 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 -can discontinue dexamethasone, now a week post-op and therefore edema is likely minimal at this point  Judith Part  07/24/18 4:28 PM

## 2018-07-24 NOTE — Progress Notes (Signed)
Subjective: Resting in bed with eyes open, not responsive to questions or commands.   Objective: Current vital signs: BP (!) 116/51   Pulse 67   Temp (!) 100.9 F (38.3 C)   Resp 12   Ht _0  (1.727 m)   Wt 132.5 kg   SpO2 98%   BMI 44.42 kg/m  Vital signs in last 24 hours: Temp:  [97.9 F (36.6 C)-100.9 F (38.3 C)] 100.9 F (38.3 C) (09/24 0600) Pulse Rate:  [67-111] 67 (09/24 0600) Resp:  [9-26] 12 (09/24 0600) BP: (116-211)/(50-78) 116/51 (09/24 0600) SpO2:  [96 %-100 %] 98 % (09/24 0600) Arterial Line BP: (119-195)/(40-65) 136/49 (09/24 0600) FiO2 (%):  [40 %] 40 % (09/24 0400) Weight:  [132.5 kg] 132.5 kg (09/24 0600)  Intake/Output from previous day: 09/23 0701 - 09/24 0700 In: 1863.5 [I.V.:1388.5; NG/GT:425; IV Piggyback:50] Out: 6075 [Urine:6075] Intake/Output this shift: No intake/output data recorded. Nutritional status:  Diet Order            Diet NPO time specified  Diet effective now        Diet Carb Modified              Neuro: Mental Status: Eyes are open. Not following commands or answering questions. No spontaneous movements. Does not make eye contact or track visual stimuli.  Cranial Nerves: II: Pupils 2 mm bilaterally. Difficult to assess reactivity to light. No blink to threat.   III,IV, VI: Eyes at midline without movement to oculocephalic maneuver.  VII: Face is symmetric.  Motor/Sensory: Will flex upper extremities to noxious stimuli without asymmetry. Minimal movement of lower extremities to plantar stimulation. Reflexes: Hypoactive lower extremities bilaterally. Brisk low amplitude upper extremity reflexes.   Cerebellar/Gait: Unable to assess.   Lab Results: Results for orders placed or performed during the hospital encounter of 07/13/18 (from the past 48 hour(s))  Glucose, capillary     Status: Abnormal   Collection Time: 07/22/18  8:05 AM  Result Value Ref Range   Glucose-Capillary 199 (H) 70 - 99 mg/dL   Comment 1 Capillary  Specimen   Basic metabolic panel     Status: Abnormal   Collection Time: 07/22/18  8:24 AM  Result Value Ref Range   Sodium 142 135 - 145 mmol/L   Potassium 4.4 3.5 - 5.1 mmol/L   Chloride 106 98 - 111 mmol/L   CO2 27 22 - 32 mmol/L   Glucose, Bld 186 (H) 70 - 99 mg/dL   BUN 34 (H) 8 - 23 mg/dL   Creatinine, Ser 1.44 (H) 0.61 - 1.24 mg/dL   Calcium 7.6 (L) 8.9 - 10.3 mg/dL   GFR calc non Af Amer 46 (L) >60 mL/min   GFR calc Af Amer 53 (L) >60 mL/min    Comment: (NOTE) The eGFR has been calculated using the CKD EPI equation. This calculation has not been validated in all clinical situations. eGFR's persistently <60 mL/min signify possible Chronic Kidney Disease.    Anion gap 9 5 - 15    Comment: Performed at [facility] [address], [place], [place] [address]  Glucose, capillary     Status: Abnormal   Collection Time: 07/22/18  8:58 AM  Result Value Ref Range   Glucose-Capillary 122 (H) 70 - 99 mg/dL   Comment 1 Capillary Specimen    Comment 2 Arterial Specimen   Glucose, capillary     Status: Abnormal   Collection Time: 07/22/18 10:09 AM  Result Value Ref Range  Glucose-Capillary 192 (H) 70 - 99 mg/dL   Comment 1 Venous Specimen   Glucose, capillary     Status: Abnormal   Collection Time: 07/22/18 10:58 AM  Result Value Ref Range   Glucose-Capillary 207 (H) 70 - 99 mg/dL   Comment 1 Venous Specimen   Basic metabolic panel     Status: Abnormal   Collection Time: 07/22/18 11:37 AM  Result Value Ref Range   Sodium 141 135 - 145 mmol/L   Potassium 4.4 3.5 - 5.1 mmol/L   Chloride 107 98 - 111 mmol/L   CO2 26 22 - 32 mmol/L   Glucose, Bld 189 (H) 70 - 99 mg/dL   BUN 37 (H) 8 - 23 mg/dL   Creatinine, Ser 1.43 (H) 0.61 - 1.24 mg/dL   Calcium 7.7 (L) 8.9 - 10.3 mg/dL   GFR calc non Af Amer 46 (L) >60 mL/min   GFR calc Af Amer 53 (L) >60 mL/min    Comment: (NOTE) The eGFR has been calculated using the CKD EPI equation. This calculation has not been validated in  all clinical situations. eGFR's persistently <60 mL/min signify possible Chronic Kidney Disease.    Anion gap 8 5 - 15    Comment: Performed at [facility] [address] [place], [place] [address]  Glucose, capillary     Status: Abnormal   Collection Time: 07/22/18 11:40 AM  Result Value Ref Range   Glucose-Capillary 175 (H) 70 - 99 mg/dL  Glucose, capillary     Status: Abnormal   Collection Time: 07/22/18  1:02 PM  Result Value Ref Range   Glucose-Capillary 177 (H) 70 - 99 mg/dL  Glucose, capillary     Status: Abnormal   Collection Time: 07/22/18  1:57 PM  Result Value Ref Range   Glucose-Capillary 164 (H) 70 - 99 mg/dL  Glucose, capillary     Status: Abnormal   Collection Time: 07/22/18  3:06 PM  Result Value Ref Range   Glucose-Capillary 148 (H) 70 - 99 mg/dL  Basic metabolic panel     Status: Abnormal   Collection Time: 07/22/18  3:50 PM  Result Value Ref Range   Sodium 143 135 - 145 mmol/L   Potassium 4.3 3.5 - 5.1 mmol/L   Chloride 109 98 - 111 mmol/L   CO2 27 22 - 32 mmol/L   Glucose, Bld 124 (H) 70 - 99 mg/dL   BUN 40 (H) 8 - 23 mg/dL   Creatinine, Ser 1.36 (H) 0.61 - 1.24 mg/dL   Calcium 7.3 (L) 8.9 - 10.3 mg/dL   GFR calc non Af Amer 49 (L) >60 mL/min   GFR calc Af Amer 57 (L) >60 mL/min    Comment: (NOTE) The eGFR has been calculated using the CKD EPI equation. This calculation has not been validated in all clinical situations. eGFR's persistently <60 mL/min signify possible Chronic Kidney Disease.    Anion gap 7 5 - 15    Comment: Performed at [facility] [address], [place], [place] [address]  Glucose, capillary     Status: None   Collection Time: 07/22/18  3:55 PM  Result Value Ref Range   Glucose-Capillary 97 70 - 99 mg/dL  Glucose, capillary     Status: Abnormal   Collection Time: 07/22/18  4:49 PM  Result Value Ref Range   Glucose-Capillary 60 (L) 70 - 99 mg/dL  Glucose, capillary     Status: Abnormal   Collection Time:  07/22/18  5:07  PM  Result Value Ref Range   Glucose-Capillary 108 (H) 70 - 99 mg/dL  Glucose, capillary     Status: Abnormal   Collection Time: 07/22/18  6:01 PM  Result Value Ref Range   Glucose-Capillary 100 (H) 70 - 99 mg/dL  Glucose, capillary     Status: Abnormal   Collection Time: 07/22/18  6:58 PM  Result Value Ref Range   Glucose-Capillary 114 (H) 70 - 99 mg/dL  Basic metabolic panel     Status: Abnormal   Collection Time: 07/22/18  8:00 PM  Result Value Ref Range   Sodium 143 135 - 145 mmol/L   Potassium 4.6 3.5 - 5.1 mmol/L   Chloride 109 98 - 111 mmol/L   CO2 27 22 - 32 mmol/L   Glucose, Bld 162 (H) 70 - 99 mg/dL   BUN 36 (H) 8 - 23 mg/dL   Creatinine, Ser 1.33 (H) 0.61 - 1.24 mg/dL   Calcium 7.4 (L) 8.9 - 10.3 mg/dL   GFR calc non Af Amer 50 (L) >60 mL/min   GFR calc Af Amer 58 (L) >60 mL/min    Comment: (NOTE) The eGFR has been calculated using the CKD EPI equation. This calculation has not been validated in all clinical situations. eGFR's persistently <60 mL/min signify possible Chronic Kidney Disease.    Anion gap 7 5 - 15    Comment: Performed at [facility] [address], [place], [place] [address]  Glucose, capillary     Status: Abnormal   Collection Time: 07/22/18  8:04 PM  Result Value Ref Range   Glucose-Capillary 151 (H) 70 - 99 mg/dL   Comment 1 Arterial Specimen   Glucose, capillary     Status: Abnormal   Collection Time: 07/22/18  9:04 PM  Result Value Ref Range   Glucose-Capillary 161 (H) 70 - 99 mg/dL   Comment 1 Arterial Specimen   Glucose, capillary     Status: Abnormal   Collection Time: 07/22/18 10:10 PM  Result Value Ref Range   Glucose-Capillary 178 (H) 70 - 99 mg/dL   Comment 1 Arterial Specimen   Glucose, capillary     Status: Abnormal   Collection Time: 07/22/18 11:04 PM  Result Value Ref Range   Glucose-Capillary 119 (H) 70 - 99 mg/dL   Comment 1 Arterial Specimen   Glucose, capillary     Status: Abnormal   Collection  Time: 07/23/18 12:13 AM  Result Value Ref Range   Glucose-Capillary 245 (H) 70 - 99 mg/dL   Comment 1 Arterial Specimen   Basic metabolic panel     Status: Abnormal   Collection Time: 07/23/18 12:14 AM  Result Value Ref Range   Sodium 142 135 - 145 mmol/L   Potassium 4.4 3.5 - 5.1 mmol/L   Chloride 109 98 - 111 mmol/L   CO2 24 22 - 32 mmol/L   Glucose, Bld 275 (H) 70 - 99 mg/dL   BUN 38 (H) 8 - 23 mg/dL   Creatinine, Ser 1.27 (H) 0.61 - 1.24 mg/dL   Calcium 7.2 (L) 8.9 - 10.3 mg/dL   GFR calc non Af Amer 53 (L) >60 mL/min   GFR calc Af Amer >60 >60 mL/min    Comment: (NOTE) The eGFR has been calculated using the CKD EPI equation. This calculation has not been validated in all clinical situations. eGFR's persistently <60 mL/min signify possible Chronic Kidney Disease.    Anion gap 9 5 - 15    Comment: Performed at  [facility], [place] [address] [place], [place] [address]  Glucose, capillary     Status: Abnormal   Collection Time: 07/23/18  1:36 AM  Result Value Ref Range   Glucose-Capillary 261 (H) 70 - 99 mg/dL   Comment 1 Arterial Specimen   Glucose, capillary     Status: Abnormal   Collection Time: 07/23/18  2:45 AM  Result Value Ref Range   Glucose-Capillary 234 (H) 70 - 99 mg/dL   Comment 1 Arterial Specimen   Basic metabolic panel     Status: Abnormal   Collection Time: 07/23/18  3:30 AM  Result Value Ref Range   Sodium 143 135 - 145 mmol/L   Potassium 4.2 3.5 - 5.1 mmol/L   Chloride 110 98 - 111 mmol/L   CO2 26 22 - 32 mmol/L   Glucose, Bld 274 (H) 70 - 99 mg/dL   BUN 38 (H) 8 - 23 mg/dL   Creatinine, Ser 1.22 0.61 - 1.24 mg/dL   Calcium 7.5 (L) 8.9 - 10.3 mg/dL   GFR calc non Af Amer 56 (L) >60 mL/min   GFR calc Af Amer >60 >60 mL/min    Comment: (NOTE) The eGFR has been calculated using the CKD EPI equation. This calculation has not been validated in all clinical situations. eGFR's persistently <60 mL/min signify possible Chronic Kidney Disease.     Anion gap 7 5 - 15    Comment: Performed at [facility] [address] [place], [place] [address]  Glucose, capillary     Status: Abnormal   Collection Time: 07/23/18  4:02 AM  Result Value Ref Range   Glucose-Capillary 255 (H) 70 - 99 mg/dL   Comment 1 Arterial Specimen   Glucose, capillary     Status: Abnormal   Collection Time: 07/23/18  5:09 AM  Result Value Ref Range   Glucose-Capillary 230 (H) 70 - 99 mg/dL   Comment 1 Arterial Specimen   Glucose, capillary     Status: Abnormal   Collection Time: 07/23/18  6:15 AM  Result Value Ref Range   Glucose-Capillary 191 (H) 70 - 99 mg/dL   Comment 1 Arterial Specimen   Glucose, capillary     Status: Abnormal   Collection Time: 07/23/18  7:29 AM  Result Value Ref Range   Glucose-Capillary 200 (H) 70 - 99 mg/dL   Comment 1 Arterial Specimen   Glucose, capillary     Status: Abnormal   Collection Time: 07/23/18  8:54 AM  Result Value Ref Range   Glucose-Capillary 200 (H) 70 - 99 mg/dL  Glucose, capillary     Status: Abnormal   Collection Time: 07/23/18 10:09 AM  Result Value Ref Range   Glucose-Capillary 171 (H) 70 - 99 mg/dL   Comment 1 Arterial Specimen   Glucose, capillary     Status: Abnormal   Collection Time: 07/23/18 10:58 AM  Result Value Ref Range   Glucose-Capillary 184 (H) 70 - 99 mg/dL   Comment 1 Arterial Specimen   Glucose, capillary     Status: Abnormal   Collection Time: 07/23/18 11:54 AM  Result Value Ref Range   Glucose-Capillary 155 (H) 70 - 99 mg/dL   Comment 1 Arterial Specimen   Basic metabolic panel     Status: Abnormal   Collection Time: 07/23/18 12:07 PM  Result Value Ref Range   Sodium 142 135 - 145 mmol/L   Potassium 3.7 3.5 - 5.1 mmol/L   Chloride 105 98 - 111 mmol/L  CO2 26 22 - 32 mmol/L   Glucose, Bld 157 (H) 70 - 99 mg/dL   BUN 36 (H) 8 - 23 mg/dL   Creatinine, Ser 1.04 0.61 - 1.24 mg/dL   Calcium 7.9 (L) 8.9 - 10.3 mg/dL   GFR calc non Af Amer >60 >60 mL/min   GFR calc Af Amer  >60 >60 mL/min    Comment: (NOTE) The eGFR has been calculated using the CKD EPI equation. This calculation has not been validated in all clinical situations. eGFR's persistently <60 mL/min signify possible Chronic Kidney Disease.    Anion gap 11 5 - 15    Comment: Performed at [facility] [address], [place], [place] [address]  Glucose, capillary     Status: Abnormal   Collection Time: 07/23/18  1:25 PM  Result Value Ref Range   Glucose-Capillary 122 (H) 70 - 99 mg/dL   Comment 1 Arterial Specimen   Glucose, capillary     Status: Abnormal   Collection Time: 07/23/18  2:33 PM  Result Value Ref Range   Glucose-Capillary 103 (H) 70 - 99 mg/dL   Comment 1 Arterial Specimen   Basic metabolic panel     Status: Abnormal   Collection Time: 07/23/18  4:15 PM  Result Value Ref Range   Sodium 145 135 - 145 mmol/L   Potassium 3.7 3.5 - 5.1 mmol/L   Chloride 107 98 - 111 mmol/L   CO2 28 22 - 32 mmol/L   Glucose, Bld 119 (H) 70 - 99 mg/dL   BUN 34 (H) 8 - 23 mg/dL   Creatinine, Ser 1.09 0.61 - 1.24 mg/dL   Calcium 8.1 (L) 8.9 - 10.3 mg/dL   GFR calc non Af Amer >60 >60 mL/min   GFR calc Af Amer >60 >60 mL/min    Comment: (NOTE) The eGFR has been calculated using the CKD EPI equation. This calculation has not been validated in all clinical situations. eGFR's persistently <60 mL/min signify possible Chronic Kidney Disease.    Anion gap 10 5 - 15    Comment: Performed at [facility] [address], [place], [place] [address]  Glucose, capillary     Status: Abnormal   Collection Time: 07/23/18  4:15 PM  Result Value Ref Range   Glucose-Capillary 111 (H) 70 - 99 mg/dL   Comment 1 Arterial Specimen   Glucose, capillary     Status: Abnormal   Collection Time: 07/23/18  5:21 PM  Result Value Ref Range   Glucose-Capillary 119 (H) 70 - 99 mg/dL   Comment 1 Arterial Specimen   Glucose, capillary     Status: Abnormal   Collection Time: 07/23/18  7:16 PM  Result Value  Ref Range   Glucose-Capillary 129 (H) 70 - 99 mg/dL   Comment 1 Arterial Specimen   Glucose, capillary     Status: None   Collection Time: 07/23/18  8:16 PM  Result Value Ref Range   Glucose-Capillary 85 70 - 99 mg/dL  Basic metabolic panel     Status: Abnormal   Collection Time: 07/23/18  8:46 PM  Result Value Ref Range   Sodium 146 (H) 135 - 145 mmol/L   Potassium 4.0 3.5 - 5.1 mmol/L   Chloride 109 98 - 111 mmol/L   CO2 29 22 - 32 mmol/L   Glucose, Bld 142 (H) 70 - 99 mg/dL   BUN 31 (H) 8 - 23 mg/dL   Creatinine, Ser 1.05 0.61 - 1.24 mg/dL  Calcium 7.9 (L) 8.9 - 10.3 mg/dL   GFR calc non Af Amer >60 >60 mL/min   GFR calc Af Amer >60 >60 mL/min    Comment: (NOTE) The eGFR has been calculated using the CKD EPI equation. This calculation has not been validated in all clinical situations. eGFR's persistently <60 mL/min signify possible Chronic Kidney Disease.    Anion gap 8 5 - 15    Comment: Performed at [facility] [address], [place], [place] [address]  Glucose, capillary     Status: Abnormal   Collection Time: 07/23/18 11:51 PM  Result Value Ref Range   Glucose-Capillary 196 (H) 70 - 99 mg/dL  Basic metabolic panel     Status: Abnormal   Collection Time: 07/23/18 11:53 PM  Result Value Ref Range   Sodium 144 135 - 145 mmol/L   Potassium 4.0 3.5 - 5.1 mmol/L   Chloride 108 98 - 111 mmol/L   CO2 28 22 - 32 mmol/L   Glucose, Bld 217 (H) 70 - 99 mg/dL   BUN 30 (H) 8 - 23 mg/dL   Creatinine, Ser 1.08 0.61 - 1.24 mg/dL   Calcium 7.8 (L) 8.9 - 10.3 mg/dL   GFR calc non Af Amer >60 >60 mL/min   GFR calc Af Amer >60 >60 mL/min    Comment: (NOTE) The eGFR has been calculated using the CKD EPI equation. This calculation has not been validated in all clinical situations. eGFR's persistently <60 mL/min signify possible Chronic Kidney Disease.    Anion gap 8 5 - 15    Comment: Performed at [facility] [address], [place], [place] [address]  Glucose,  capillary     Status: Abnormal   Collection Time: 07/24/18 12:55 AM  Result Value Ref Range   Glucose-Capillary 195 (H) 70 - 99 mg/dL   Comment 1 Venous Specimen   Glucose, capillary     Status: Abnormal   Collection Time: 07/24/18  2:04 AM  Result Value Ref Range   Glucose-Capillary 227 (H) 70 - 99 mg/dL   Comment 1 Arterial Specimen   Glucose, capillary     Status: Abnormal   Collection Time: 07/24/18  3:17 AM  Result Value Ref Range   Glucose-Capillary 184 (H) 70 - 99 mg/dL  Basic metabolic panel     Status: Abnormal   Collection Time: 07/24/18  4:22 AM  Result Value Ref Range   Sodium 145 135 - 145 mmol/L   Potassium 3.9 3.5 - 5.1 mmol/L   Chloride 110 98 - 111 mmol/L   CO2 28 22 - 32 mmol/L   Glucose, Bld 200 (H) 70 - 99 mg/dL   BUN 33 (H) 8 - 23 mg/dL   Creatinine, Ser 1.12 0.61 - 1.24 mg/dL   Calcium 8.0 (L) 8.9 - 10.3 mg/dL   GFR calc non Af Amer >60 >60 mL/min   GFR calc Af Amer >60 >60 mL/min    Comment: (NOTE) The eGFR has been calculated using the CKD EPI equation. This calculation has not been validated in all clinical situations. eGFR's persistently <60 mL/min signify possible Chronic Kidney Disease.    Anion gap 7 5 - 15    Comment: Performed at [facility] [address], [place] [address]  CBC     Status: Abnormal   Collection Time: 07/24/18  4:22 AM  Result Value Ref Range   WBC 18.5 (H) 4.0 - 10.5 K/uL   RBC 4.09 (L) 4.22 -  5.81 MIL/uL   Hemoglobin 11.6 (L) 13.0 - 17.0 g/dL   HCT 36.6 (L) 39.0 - 52.0 %   MCV 89.5 78.0 - 100.0 fL   MCH 28.4 26.0 - 34.0 pg   MCHC 31.7 30.0 - 36.0 g/dL   RDW 13.9 11.5 - 15.5 %   Platelets 209 150 - 400 K/uL    Comment: Performed at [facility] [address] [place], [place] [address]  Glucose, capillary     Status: Abnormal   Collection Time: 07/24/18  4:25 AM  Result Value Ref Range   Glucose-Capillary 178 (H) 70 - 99 mg/dL  Glucose, capillary     Status: Abnormal   Collection Time:  07/24/18  5:28 AM  Result Value Ref Range   Glucose-Capillary 164 (H) 70 - 99 mg/dL  Culture, respiratory (non-expectorated)     Status: None (Preliminary result)   Collection Time: 07/24/18  6:18 AM  Result Value Ref Range   Specimen Description TRACHEAL ASPIRATE    Special Requests NONE    Gram Stain      ABUNDANT WBC PRESENT, PREDOMINANTLY PMN MODERATE GRAM POSITIVE COCCI FEW GRAM NEGATIVE RODS Performed at [facility], [place] [address], [place], [place] [address]    Culture PENDING    Report Status PENDING     Recent Results (from the past 240 hour(s))  Culture, respiratory (non-expectorated)     Status: None (Preliminary result)   Collection Time: 07/24/18  6:18 AM  Result Value Ref Range Status   Specimen Description TRACHEAL ASPIRATE  Final   Special Requests NONE  Final   Gram Stain   Final    ABUNDANT WBC PRESENT, PREDOMINANTLY PMN MODERATE GRAM POSITIVE COCCI FEW GRAM NEGATIVE RODS Performed at [facility], [address], [place], [place] [address]    Culture PENDING  Incomplete   Report Status PENDING  Incomplete    Lipid Panel No results for input(s): CHOL, TRIG, HDL, CHOLHDL, VLDL, LDLCALC in the last 72 hours.  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 EXAM: MRI HEAD WITHOUT CONTRAST MRI CERVICAL SPINE WITHOUT CONTRAST MRI THORACIC SPINE WITHOUT CONTRAST TECHNIQUE: Multiplanar, multiecho pulse sequences of the brain and surrounding structures, and cervical spine, to include the craniocervical junction and cervicothoracic junction, were obtained without intravenous contrast. COMPARISON:  07/19/2018 CT head. 07/13/2018 MRI head. 07/09/2018 MRI of the cervical spine. 03/10/2018 CT of the chest. FINDINGS: MRI HEAD FINDINGS Brain: Increased T2 signal within the caudate nuclei and the lentiform nuclei with heterogeneous areas of reduced diffusion  compatible with hypoxic ischemic injury of the brain. No associated hemorrhage or mass effect. No additional significant structural or signal abnormality of the brain identified. No extra-axial collection, hydrocephalus, focal mass effect, effacement of basilar cisterns, or herniation. Vascular: Normal flow voids. Skull and upper cervical spine: Normal marrow signal. Sinuses/Orbits: Mild paranasal sinus mucosal thickening and mastoid opacification likely related to intubation. Other: None. MRI CERVICAL SPINE FINDINGS Alignment: Physiologic. Vertebrae: Interval C3-C7 anterior cervical fusion and discectomy. There is prevertebral edema through the levels of surgery extending into the right anterior neck compatible postsurgical changes. No findings of bone marrow edema or discitis. No epidural fluid collection. Cord: Normal signal and morphology. Posterior Fossa, vertebral arteries, paraspinal tissues: As above. Disc levels: C2-3: Stable uncovertebral and facet hypertrophy with mild bilateral foraminal stenosis. No canal stenosis. C3-4: Discectomy and anterior fusion. Stable residual uncovertebral and  facet hypertrophy with moderate bilateral foraminal stenosis. Improved canal patency, mild residual stenosis. C4-5: Discectomy and anterior fusion. Residual endplate marginal osteophytes as well as bilateral uncovertebral and facet hypertrophy. Severe bilateral foraminal stenosis. Stable moderate canal stenosis. C5-6: Discectomy and anterior fusion. Residual endplate marginal osteophytes as well as uncovertebral and facet hypertrophy. Stable severe foraminal stenosis. Improved patency of the spinal canal with mild residual stenosis. C6-7: Discectomy and anterior fusion. Residual endplate marginal osteophytes with uncovertebral and facet hypertrophy. Improved patency of the spinal canal with mild residual stenosis. No significant foraminal stenosis. C7-T1: Stable small disc bulge and facet hypertrophy. Mild canal stenosis.  No significant foraminal stenosis. MRI THORACIC SPINE FINDINGS Alignment:  Physiologic. Vertebrae: T6-7 mild endplate edema, likely degenerative. No loss of vertebral body height. No findings of fracture or discitis. Cord:  Normal signal and morphology. Paraspinal and other soft tissues: Negative. Disc levels: Multilevel disc desiccation with loss of intervertebral disc space height. Mild T8-9, T9-10, and T12-L1 disc bulges with ventral thecal sac effacement. No significant canal stenosis. There is multilevel facet hypertrophy most pronounced at the T5-T7 as well as T10-T12 levels with there is mild foraminal encroachment. IMPRESSION: MRI head: 1. Caudate and lentiform nuclei increased T2 signal and heterogeneous reduced diffusion compatible with hypoxic ischemic injury of the brain in the setting of cardiac arrest. 2. No hemorrhage, mass effect, or herniation. MRI cervical spine: 1. No acute osseous or cord signal abnormality identified. 2. Interval C3-C7 anterior cervical fusion and discectomy. Small prevertebral fluid collection extending into the right anterior neck compatible with postsurgical changes. No epidural collection. 3. Mildly improved patency of the cervical spinal canal. Stable residual multilevel foraminal stenosis from uncovertebral and facet hypertrophy. MRI thoracic spine: 1. No acute osseous or cord signal abnormality identified. 2. Mild thoracic spine spondylosis. No high-grade foraminal or canal stenosis. Electronically Signed   By: Kristine Garbe M.D.   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 EXAM: MRI HEAD WITHOUT CONTRAST MRI CERVICAL SPINE WITHOUT CONTRAST MRI THORACIC SPINE WITHOUT CONTRAST TECHNIQUE: Multiplanar, multiecho pulse sequences of the brain and surrounding structures, and cervical spine, to include the craniocervical junction and  cervicothoracic junction, were obtained without intravenous contrast. COMPARISON:  07/19/2018 CT head. 07/13/2018 MRI head. 07/09/2018 MRI of the cervical spine. 03/10/2018 CT of the chest. FINDINGS: MRI HEAD FINDINGS Brain: Increased T2 signal within the caudate nuclei and the lentiform nuclei with heterogeneous areas of reduced diffusion compatible with hypoxic ischemic injury of the brain. No associated hemorrhage or mass effect. No additional significant structural or signal abnormality of the brain identified. No extra-axial collection, hydrocephalus, focal mass effect, effacement of basilar cisterns, or herniation. Vascular: Normal flow voids. Skull and upper cervical spine: Normal marrow signal. Sinuses/Orbits: Mild paranasal sinus mucosal thickening and mastoid opacification likely related to intubation. Other: None. MRI CERVICAL SPINE FINDINGS Alignment: Physiologic. Vertebrae: Interval C3-C7 anterior cervical fusion and discectomy. There is prevertebral edema through the levels of surgery extending into the right anterior neck compatible postsurgical changes. No findings of bone marrow edema or discitis. No epidural fluid collection. Cord: Normal signal and morphology. Posterior Fossa, vertebral arteries, paraspinal tissues: As above. Disc levels: C2-3: Stable uncovertebral and facet hypertrophy with mild bilateral foraminal stenosis. No canal stenosis. C3-4: Discectomy and anterior fusion. Stable residual uncovertebral and facet hypertrophy with moderate bilateral foraminal stenosis. Improved canal patency, mild residual stenosis. C4-5: Discectomy and anterior  fusion. Residual endplate marginal osteophytes as well as bilateral uncovertebral and facet hypertrophy. Severe bilateral foraminal stenosis. Stable moderate canal stenosis. C5-6: Discectomy and anterior fusion. Residual endplate marginal osteophytes as well as uncovertebral and facet hypertrophy. Stable severe foraminal stenosis. Improved patency  of the spinal canal with mild residual stenosis. C6-7: Discectomy and anterior fusion. Residual endplate marginal osteophytes with uncovertebral and facet hypertrophy. Improved patency of the spinal canal with mild residual stenosis. No significant foraminal stenosis. C7-T1: Stable small disc bulge and facet hypertrophy. Mild canal stenosis. No significant foraminal stenosis. MRI THORACIC SPINE FINDINGS Alignment:  Physiologic. Vertebrae: T6-7 mild endplate edema, likely degenerative. No loss of vertebral body height. No findings of fracture or discitis. Cord:  Normal signal and morphology. Paraspinal and other soft tissues: Negative. Disc levels: Multilevel disc desiccation with loss of intervertebral disc space height. Mild T8-9, T9-10, and T12-L1 disc bulges with ventral thecal sac effacement. No significant canal stenosis. There is multilevel facet hypertrophy most pronounced at the T5-T7 as well as T10-T12 levels with there is mild foraminal encroachment. IMPRESSION: MRI head: 1. Caudate and lentiform nuclei increased T2 signal and heterogeneous reduced diffusion compatible with hypoxic ischemic injury of the brain in the setting of cardiac arrest. 2. No hemorrhage, mass effect, or herniation. MRI cervical spine: 1. No acute osseous or cord signal abnormality identified. 2. Interval C3-C7 anterior cervical fusion and discectomy. Small prevertebral fluid collection extending into the right anterior neck compatible with postsurgical changes. No epidural collection. 3. Mildly improved patency of the cervical spinal canal. Stable residual multilevel foraminal stenosis from uncovertebral and facet hypertrophy. MRI thoracic spine: 1. No acute osseous or cord signal abnormality identified. 2. Mild thoracic spine spondylosis. No high-grade foraminal or canal stenosis. Electronically Signed   By: Kristine Garbe M.D.   On: 07/24/2018 00:10   Mr Thoracic Spine Wo Contrast  Result Date: 07/24/2018 CLINICAL  DATA:  77 y/o M; paraparesis following cardiac arrest and spinal surgery. EXAM: MRI HEAD WITHOUT CONTRAST MRI CERVICAL SPINE WITHOUT CONTRAST MRI THORACIC SPINE WITHOUT CONTRAST TECHNIQUE: Multiplanar, multiecho pulse sequences of the brain and surrounding structures, and cervical spine, to include the craniocervical junction and cervicothoracic junction, were obtained without intravenous contrast. COMPARISON:  07/19/2018 CT head. 07/13/2018 MRI head. 07/09/2018 MRI of the cervical spine. 03/10/2018 CT of the chest. FINDINGS: MRI HEAD FINDINGS Brain: Increased T2 signal within the caudate nuclei and the lentiform nuclei with heterogeneous areas of reduced diffusion compatible with hypoxic ischemic injury of the brain. No associated hemorrhage or mass effect. No additional significant structural or signal abnormality of the brain identified. No extra-axial collection, hydrocephalus, focal mass effect, effacement of basilar cisterns, or herniation. Vascular: Normal flow voids. Skull and upper cervical spine: Normal marrow signal. Sinuses/Orbits: Mild paranasal sinus mucosal thickening and mastoid opacification likely related to intubation. Other: None. MRI CERVICAL SPINE FINDINGS Alignment: Physiologic. Vertebrae: Interval C3-C7 anterior cervical fusion and discectomy. There is prevertebral edema through the levels of surgery extending into the right anterior neck compatible postsurgical changes. No findings of bone marrow edema or discitis. No epidural fluid collection. Cord: Normal signal and morphology. Posterior Fossa, vertebral arteries, paraspinal tissues: As above. Disc levels: C2-3: Stable uncovertebral and facet hypertrophy with mild bilateral foraminal stenosis. No canal stenosis. C3-4: Discectomy and anterior fusion. Stable residual uncovertebral and facet hypertrophy with moderate bilateral foraminal stenosis. Improved canal patency, mild residual stenosis. C4-5: Discectomy and anterior fusion. Residual  endplate marginal osteophytes as well as bilateral uncovertebral and facet hypertrophy. Severe bilateral foraminal  stenosis. Stable moderate canal stenosis. C5-6: Discectomy and anterior fusion. Residual endplate marginal osteophytes as well as uncovertebral and facet hypertrophy. Stable severe foraminal stenosis. Improved patency of the spinal canal with mild residual stenosis. C6-7: Discectomy and anterior fusion. Residual endplate marginal osteophytes with uncovertebral and facet hypertrophy. Improved patency of the spinal canal with mild residual stenosis. No significant foraminal stenosis. C7-T1: Stable small disc bulge and facet hypertrophy. Mild canal stenosis. No significant foraminal stenosis. MRI THORACIC SPINE FINDINGS Alignment:  Physiologic. Vertebrae: T6-7 mild endplate edema, likely degenerative. No loss of vertebral body height. No findings of fracture or discitis. Cord:  Normal signal and morphology. Paraspinal and other soft tissues: Negative. Disc levels: Multilevel disc desiccation with loss of intervertebral disc space height. Mild T8-9, T9-10, and T12-L1 disc bulges with ventral thecal sac effacement. No significant canal stenosis. There is multilevel facet hypertrophy most pronounced at the T5-T7 as well as T10-T12 levels with there is mild foraminal encroachment. IMPRESSION: MRI head: 1. Caudate and lentiform nuclei increased T2 signal and heterogeneous reduced diffusion compatible with hypoxic ischemic injury of the brain in the setting of cardiac arrest. 2. No hemorrhage, mass effect, or herniation. MRI cervical spine: 1. No acute osseous or cord signal abnormality identified. 2. Interval C3-C7 anterior cervical fusion and discectomy. Small prevertebral fluid collection extending into the right anterior neck compatible with postsurgical changes. No epidural collection. 3. Mildly improved patency of the cervical spinal canal. Stable residual multilevel foraminal stenosis from uncovertebral  and facet hypertrophy. MRI thoracic spine: 1. No acute osseous or cord signal abnormality identified. 2. Mild thoracic spine spondylosis. No high-grade foraminal or canal stenosis. Electronically Signed   By: Kristine Garbe M.D.   On: 07/24/2018 00:10    Medications:  Scheduled: . amLODipine  10 mg Per Tube Daily  . chlorhexidine gluconate (MEDLINE KIT)  15 mL Mouth Rinse BID  . dexamethasone  2 mg Intravenous Q8H  . dicyclomine  10 mg Per Tube BID  . enoxaparin (LOVENOX) injection  40 mg Subcutaneous Q24H  . feeding supplement (PRO-STAT SUGAR FREE 64)  30 mL Per Tube BID  . feeding supplement (VITAL HIGH PROTEIN)  1,000 mL Per Tube Q24H  . insulin detemir  30 Units Subcutaneous Daily  . mouth rinse  15 mL Mouth Rinse 10 times per day  . polyethylene glycol  17 g Oral Daily  . polyvinyl alcohol  1 drop Both Eyes TID  . prednisoLONE acetate  2 drop Both Eyes TID AC & HS  . primidone  50 mg Per Tube BID  . rosuvastatin  20 mg Per Tube Once per day on Tue Thu  . senna-docusate  2 tablet Oral BID   Continuous: . sodium chloride Stopped (07/23/18 2113)  . dexmedetomidine (PRECEDEX) IV infusion 0.6 mcg/kg/hr (07/24/18 0102)  . famotidine (PEPCID) IV 20 mg (07/24/18 0026)  . fentaNYL infusion INTRAVENOUS 200 mcg/hr (07/24/18 0104)  . insulin (NOVOLIN-R) infusion 4.8 Units/hr (07/24/18 0617)  . lactated ringers 10 mL/hr at 07/20/18 1500  . piperacillin-tazobactam    . piperacillin-tazobactam (ZOSYN)  IV    . vancomycin    . vancomycin     Repeat MRI brain (9/23): 1. Caudate and lentiform nuclei increased T2 signal and heterogeneous reduced diffusion compatible with hypoxic ischemic injury of the brain in the setting of cardiac arrest. 2. No hemorrhage, mass effect, or herniation.  Repeat MRI cervical spine (9/23): 1. No acute osseous or cord signal abnormality identified. 2. Interval C3-C7 anterior cervical fusion and discectomy.  Small prevertebral fluid collection  extending into the right anterior neck compatible with postsurgical changes. No epidural collection. 3. Mildly improved patency of the cervical spinal canal. Stable residual multilevel foraminal stenosis from uncovertebral and facet hypertrophy.  MRI thoracic spine (9/23): 1. No acute osseous or cord signal abnormality identified. 2. Mild thoracic spine spondylosis. No high-grade foraminal or canal stenosis.  Assessment: 77 year old male with paraparesis following cardiac arrest with recent spinal surgery.  1. He had severe multilevel spondylosis in the cervical spine and initial DDx for his new paraparesis included possible cord contusion and possible exacerbation of spondylosis during his cardiac arrest and CPR.  However, this has been ruled out as repeat MRI of cervical and thoracic spine from yesterday shows no cord contusion or other spinal cord lesion.  2. 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 This also provides the most likely explanation for the patient's new paraparesis.  3. Also from an anoxic brain injury standpoint, he was purposeful soon after his rewarming, and there was no clear evidence of a poor prognosis at the time of the initial consult. However, given lack of purposeful responses on follow up exam today, likelihood of a poor functional outcome long term is now increased.  Recommendations: 1) Continue supportive care 2) PT/OT 3) Neurology will sign off. Please call if there are additional questions.   35 minutes spent in the neurological evaluation and management of this critically ill patient.    LOS: 11 days   _0  signed: Dr. Kerney Elbe 07/24/2018  7:37 AM

## 2018-07-24 NOTE — Progress Notes (Signed)
Pharmacy Antibiotic Note  Edward [name] is a 77 y.o. male admitted on 07/13/2018 for neurosurgery then had asystolic event >> was cooled and required intubation since, now w/ fever and leulocytosis, to begin broad-spectrum ABX.  Pharmacy has been consulted for Vancocin and Zosyn dosing.  Plan: Vancomycin 2000mg  x1 then 1000mg  IV every 12 hours.  Goal trough 15-20 mcg/mL. Zosyn 3.375g IV q8h (4 hour infusion).  Height: 5\' 8"  (172.7 cm) Weight: 292 lb 1.8 oz (132.5 kg) IBW/kg (Calculated) : 68.4  Temp (24hrs), Avg:100 F (37.8 C), Min:97.7 F (36.5 C), Max:100.9 F (38.3 C)  Recent Labs  Lab 07/19/18 1716  07/21/18 0407  07/23/18 1207 07/23/18 1615 07/23/18 2046 07/23/18 2353 07/24/18 0422  WBC 10.2  --  14.6*  --   --   --   --   --  18.5*  CREATININE 1.30*   < > 0.87   < > 1.04 1.09 1.05 1.08 1.12  LATICACIDVEN 5.7*  --   --   --   --   --   --   --   --    < > = values in this interval not displayed.    Estimated Creatinine Clearance: 74.6 mL/min (by C-G formula based on SCr of 1.12 mg/dL).    Allergies  Allergen Reactions  . Pneumococcal Vaccines Anaphylaxis    Thank you for allowing pharmacy to be a part of this patient's care.  Wynona Neat, PharmD, BCPS  07/24/2018 6:10 AM

## 2018-07-25 ENCOUNTER — Inpatient Hospital Stay (HOSPITAL_COMMUNITY): Payer: Medicare Other

## 2018-07-25 LAB — BASIC METABOLIC PANEL
Anion gap: 10 (ref 5–15)
BUN: 37 mg/dL — AB (ref 8–23)
CALCIUM: 8.5 mg/dL — AB (ref 8.9–10.3)
CO2: 28 mmol/L (ref 22–32)
Chloride: 108 mmol/L (ref 98–111)
Creatinine, Ser: 1.08 mg/dL (ref 0.61–1.24)
GFR calc non Af Amer: >60 mL/min (ref >60)
Glucose, Bld: 227 mg/dL — ABNORMAL HIGH (ref 70–99)
Potassium: 4.2 mmol/L (ref 3.5–5.1)
SODIUM: 146 mmol/L — AB (ref 135–145)

## 2018-07-25 LAB — GLUCOSE, CAPILLARY
GLUCOSE-CAPILLARY: 107 mg/dL — AB (ref 70–99)
GLUCOSE-CAPILLARY: 110 mg/dL — AB (ref 70–99)
GLUCOSE-CAPILLARY: 122 mg/dL — AB (ref 70–99)
GLUCOSE-CAPILLARY: 127 mg/dL — AB (ref 70–99)
GLUCOSE-CAPILLARY: 157 mg/dL — AB (ref 70–99)
Glucose-Capillary: 217 mg/dL — ABNORMAL HIGH (ref 70–99)
Glucose-Capillary: 197 mg/dL — ABNORMAL HIGH (ref 70–99)
Glucose-Capillary: 194 mg/dL — ABNORMAL HIGH (ref 70–99)
Glucose-Capillary: 199 mg/dL — ABNORMAL HIGH (ref 70–99)
Glucose-Capillary: 149 mg/dL — ABNORMAL HIGH (ref 70–99)

## 2018-07-25 LAB — CBC
HCT: 39.5 % (ref 39.0–52.0)
Hemoglobin: 12.5 g/dL — ABNORMAL LOW (ref 13.0–17.0)
MCH: 28.3 pg (ref 26.0–34.0)
MCHC: 31.6 g/dL (ref 30.0–36.0)
MCV: 89.6 fL (ref 78.0–100.0)
Platelets: 242 10*3/uL (ref 150–400)
RBC: 4.41 MIL/uL (ref 4.22–5.81)
RDW: 13.9 % (ref 11.5–15.5)
WBC: 20.9 10*3/uL — ABNORMAL HIGH (ref 4.0–10.5)

## 2018-07-25 LAB — MAGNESIUM: Magnesium: 2.5 mg/dL — ABNORMAL HIGH (ref 1.7–2.4)

## 2018-07-25 LAB — PROCALCITONIN: PROCALCITONIN: 0.6 ng/mL

## 2018-07-25 MED ORDER — INSULIN ASPART 100 UNIT/ML ~~LOC~~ SOLN
3.0000 [IU] | SUBCUTANEOUS | Status: DC
  Administered 2018-07-25: 6 [IU] via SUBCUTANEOUS
  Administered 2018-07-25: 3 [IU] via SUBCUTANEOUS
  Administered 2018-07-25: 6 [IU] via SUBCUTANEOUS
  Administered 2018-07-26: 9 [IU] via SUBCUTANEOUS
  Administered 2018-07-26: 3 [IU] via SUBCUTANEOUS
  Administered 2018-07-26: 9 [IU] via SUBCUTANEOUS
  Administered 2018-07-26 (×2): 3 [IU] via SUBCUTANEOUS
  Administered 2018-07-26: 9 [IU] via SUBCUTANEOUS
  Administered 2018-07-27: 3 [IU] via SUBCUTANEOUS
  Administered 2018-07-27: 6 [IU] via SUBCUTANEOUS
  Administered 2018-07-27 (×2): 3 [IU] via SUBCUTANEOUS
  Administered 2018-07-28: 6 [IU] via SUBCUTANEOUS
  Administered 2018-07-28: 3 [IU] via SUBCUTANEOUS

## 2018-07-25 MED ORDER — INSULIN DETEMIR 100 UNIT/ML ~~LOC~~ SOLN
20.0000 [IU] | Freq: Two times a day (BID) | SUBCUTANEOUS | Status: DC
  Administered 2018-07-25 – 2018-07-28 (×7): 20 [IU] via SUBCUTANEOUS
  Filled 2018-07-25 (×10): qty 0.2

## 2018-07-25 MED ORDER — VITAL HIGH PROTEIN PO LIQD
1000.0000 mL | ORAL | Status: DC
  Administered 2018-07-26: 1000 mL

## 2018-07-25 NOTE — Progress Notes (Signed)
NAME:  Edward [name], MRN:  [id number], DOB:  10/21/1941, LOS: 12 ADMISSION DATE:  07/13/2018, CONSULTATION DATE: 9/19 REFERRING MD:  Roylene Reason, CHIEF COMPLAINT:  Cardiac arrest    Brief History   77 year old patient with history of diabetes with history of difficult glycemic control and cervical stenosis resulting in cord compression underwent anterior cervical disc fusion of cervical spine on September 16.  Managed without steroids due to concerns of glycemic control challenge.  On 9/19 began to notice difficulty swallowing, not able to even swallow his own saliva.  Had been ordered Decadron but this was not given.  He was found by nursing staff unresponsive and in asystole.  He underwent 1 round of ACLS with successful return of spontaneous circulation. Tx to ICU for hypothermia protocol.   Significant Hospital Events   9/16: Anterior cervical disc fusion 7/53: Asystolic arrest.  Estimated time to return of spontaneous circulation approximately 3 minutes.  Unresponsive not following commands postarrest so hypothermia protocol initiated.  Goal temperature met at: 1030 pm  9/20: Hemodynamically stable on low-dose norepinephrine 9/21: completed rewarming. Paralytic and sedation discontinued. 9/22: paralysis/sedation and vasopressor therapy discontinued yesterday. Restlessness developed last night. IV fentanyl started. Reqired iv labetalol to treat HTN. Has a h/o HTN. 9/24>> Developed fever Vanc/Zosyn started  Consults: date of consult/date signed off & final recs:  Neuro 9/22>>> Cardiology 9/20>>> Procedures (surgical and bedside):  9/16: Anterior cervical disc fusion by neurosurgery 9/19: Oral endotracheal tube 9/19: Left internal jugular vein triple-lumen catheter>>> Significant Diagnostic Tests:  9/23 MRI Head Caudate and lentiform nuclei increased T2 signal and heterogeneous reduced diffusion compatible with hypoxic ischemic injury of the brain in the setting of cardiac arrest. No  hemorrhage, mass effect, or herniation 9/23 MRI cervical spine: No acute osseous or cord signal abnormality identified. Interval C3-C7 anterior cervical fusion and discectomy. Small prevertebral fluid collection extending into the right anterior neck compatible with postsurgical changes. No epidural collection. Mildly improved patency of the cervical spinal canal. Stable residual multilevel foraminal stenosis from uncovertebral and facet Hypertrophy. 9/23 MRI thoracic spine Mild thoracic spine spondylosis. No high-grade foraminal or canal Stenosis 9/19: CT brain noncontrasted: Negative CT cervical spine 9/19: Status post C3-C7 ACDF with expected postoperative changes.  No fracture or malalignment 2D echo 9/20 >>>Left ventricle: The cavity size was normal. There was severe   concentric hypertrophy. Systolic function was vigorous. The   estimated ejection fraction was in the range of 65% to 70%. Wall   motion was normal; there were no regional wall motion   abnormalities. Doppler parameters are consistent with abnormal   left ventricular relaxation (grade 1 diastolic dysfunction).   Doppler parameters are consistent with elevated mean left atrial   filling pressure. - Mitral valve: Valve area by pressure half-time: 2.04 cm^2. - Left atrium: The atrium was mildly dilated. Micro Data:  9/24>>Sputum >> GS>> Abundant WBC Moderate Gram + cocci, Few gram - rods  Antimicrobials:  Vanc 9/24>> Zosyn 9/24>>  Subjective:  Remains hypertensive , on insulin gtt,   Sedated with fentanyl and precedex.  Following some simple commands but not consistently.  Objective   Blood pressure (!) 157/48, pulse 63, temperature 99.7 F (37.6 C), resp. rate 14, height '5\' 8"'  (1.727 m), weight 129.2 kg, SpO2 99 %. CVP:  [6 mmHg-12 mmHg] 7 mmHg  Vent Mode: PSV;CPAP FiO2 (%):  [40 %] 40 % Set Rate:  [14 bmp] 14 bmp Vt Set:  [550 mL] 550 mL PEEP:  [5 cmH20] 5 cmH20 Pressure  Support:  [5 Temperance  Pressure:  [13 cmH20-14 cmH20] 14 cmH20   Intake/Output Summary (Last 24 hours) at 07/25/2018 1020 Last data filed at 07/25/2018 0900 Gross per 24 hour  Intake 1371.54 ml  Output 4650 ml  Net -3278.46 ml   Filed Weights   07/23/18 0417 07/24/18 0600 07/25/18 0500  Weight: 132.5 kg 132.5 kg 129.2 kg    Examination: General: Obese male no acute distress HEENT: Endotracheal and gastric tube in place Neuro: Follows commands moves extremities, nods appropriately CV: Heart sounds are regular PULM: even/non-labored, lungs bilaterally rhonchi AL:PFXT, non-tender, bsx4 active  Extremities: warm/dry, 1+ edema  Skin: no rashes or lesions    Resolved Hospital Problem list    Assessment & Plan:  Status post asystolic cardiac arrest.  Presumed hypoxia mediate given pmh OSA and report of inadequate airway protection pre-arrest, swollen tongue so wonder about adverse reaction to medications??   Does have a history of coronary artery disease  H/O coronary artery disease and CABG back in 2019 with chronic diastolic heart failure HTN  Plan Continue monitoring Cardiology is following Diuresis as tolerated  Ventilator dependence status post cardiopulmonary arrest H/o OSA  Plan Continue vent support Trend chest x-ray Note he has had cervical fixation this may complicate any extubation Bronchodilator Questionable need for fiberoptic bronchoscopy due to copious secretions.  ID ? Aspiration pneumonia Plan: Vancomycin and Zosyn day 1 Cultures are pending    Acute metabolic encephalopathy in setting of possible anoxic brain injury -Initial CT imaging negative - MRI 9/23 : Caudate and lentiform nuclei increased T2 signal and heterogeneous reduced diffusion compatible with hypoxic ischemic injury of the brain in the setting of cardiac arrest. Plan Neurology is following Awake and follows commands nods appropriately Precedex has been stopped Off all sedation x9 hours   Chronic  kidney disease stage III Creatinine stable after lasix x 1 dose Lab Results  Component Value Date   CREATININE 1.06 07/24/2018   CREATININE 1.12 07/24/2018   CREATININE 1.08 07/23/2018   CREATININE 1.33 (H) 12/31/2015    Intake/Output Summary (Last 24 hours) at 07/25/2018 1023 Last data filed at 07/25/2018 0900 Gross per 24 hour  Intake 1371.54 ml  Output 4650 ml  Net -3278.46 ml    Plan Monitor electrolytes Diuresis as tolerated - I&O as tolerated, note 3200 cc negative, this may help with extubation.   Diabetes   Plan  Transition to sliding scale insulin per protocol Note he is on steroids Tube feedings were stopped during the night for vomiting. 07/25/2018 trickle tube feeds monitor glucose transition to sliding scale insulin per protocol  Status post anterior cervical disc fusion 9/16 Plan Currently on Decadron 2 mg every 8 hours Will be a factor and extubation   Disposition / Summary of Today's Plan 07/25/18   Continue to wean as able Consideration to trial extubation    Diet: enteral feeding Pain/Anxiety/Delirium protocol (if indicated): PAD protocol VAP protocol (if indicated) ordered 9/20 DVT prophylaxis: low molecular weight heparin  GI prophylaxis: H2 blockade Hyperglycemia protocol: Hyperglycemia protocol Mobility: Bedrest Code Status: Full code Family Communication: 07/25/2018 no family at bedside patient updated  Labs   CBC: Recent Labs  Lab 07/19/18 1716  07/19/18 2305 07/20/18 0110 07/21/18 0407 07/24/18 0422 07/25/18 0339  WBC 10.2  --   --   --  14.6* 18.5* 20.9*  NEUTROABS 7.4  --   --   --   --   --   --   HGB  18.1*   < > 13.9 13.6 13.3 11.6* 12.5*  HCT 57.3*   < > 41.0 40.0 41.2 36.6* 39.5  MCV 89.0  --   --   --  86.4 89.5 89.6  PLT 145*  --   --   --  259 209 242   < > = values in this interval not displayed.   Basic Metabolic Panel: Recent Labs  Lab 07/19/18 1716  07/20/18 1119 07/20/18 1638 07/20/18 2058  07/21/18 0407   07/21/18 1617  07/22/18 0437  07/23/18 1615 07/23/18 2046 07/23/18 2353 07/24/18 0422 07/24/18 0841  NA 137   < > 140 141 139   < > 140   < > 141   < > 142   < > 145 146* 144 145 144  K 4.0   < > 4.0 4.1 3.8   < > 3.5   < > 4.0   < > 4.3   < > 3.7 4.0 4.0 3.9 4.2  CL 95*   < > 104 106 105   < > 106   < > 107   < > 107   < > 107 109 108 110 110  CO2 26   < > '27 25 25   ' < > 27   < > 26   < > 27   < > '28 29 28 28 28  ' GLUCOSE 318*   < > 160* 177* 261*   < > 217*   < > 125*   < > 173*   < > 119* 142* 217* 200* 191*  BUN 15   < > '12 11 13   ' < > 14   < > 21   < > 32*   < > 34* 31* 30* 33* 32*  CREATININE 1.30*   < > 0.72 0.76 0.79   < > 0.87   < > 1.29*   < > 1.44*   < > 1.09 1.05 1.08 1.12 1.06  CALCIUM 8.6*   < > 8.3* 8.0* 8.0*   < > 7.9*   < > 7.8*   < > 7.7*   < > 8.1* 7.9* 7.8* 8.0* 7.9*  MG 1.8  --  1.8 1.8  --   --  2.0  --  2.0  --   --   --   --   --   --   --   --   PHOS  --   --  1.3*  --  3.4  --  2.6  --  3.5  --  4.0  --   --   --   --   --   --    < > = values in this interval not displayed.   GFR: Estimated Creatinine Clearance: 77.7 mL/min (by C-G formula based on SCr of 1.06 mg/dL). Recent Labs  Lab 07/19/18 1716 07/21/18 0407 07/24/18 0422 07/25/18 0339  PROCALCITON  --   --   --  0.60  WBC 10.2 14.6* 18.5* 20.9*  LATICACIDVEN 5.7*  --   --   --    Liver Function Tests: Recent Labs  Lab 07/19/18 1716  AST 135*  ALT 105*  ALKPHOS 89  BILITOT 1.0  PROT 6.6  ALBUMIN 3.2*   No results for input(s): LIPASE, AMYLASE in the last 168 hours. No results for input(s): AMMONIA in the last 168 hours. ABG    Component Value Date/Time   PHART 7.426 07/20/2018 0908   PCO2ART 43.1 07/20/2018  0908   PO2ART 126.0 (H) 07/20/2018 0908   HCO3 29.7 (H) 07/20/2018 0908   TCO2 31 07/20/2018 0908   ACIDBASEDEF 2.0 11/09/2016 2003   O2SAT 99.0 07/20/2018 0908    Coagulation Profile: Recent Labs  Lab 07/19/18 1858 07/20/18 0312  INR 1.16 1.20   Cardiac Enzymes: Recent  Labs  Lab 07/19/18 1716 07/19/18 2314 07/20/18 0402 07/20/18 1119  TROPONINI <0.03 0.30* 0.25* 0.16*   HbA1C: Hgb A1c MFr Bld  Date/Time Value Ref Range Status  06/19/2018 10:56 AM 9.5 (H) 4.6 - 6.5 % Final    Comment:    Glycemic Control Guidelines for People with Diabetes:Non Diabetic:  <6%Goal of Therapy: <7%Additional Action Suggested:  >8%   03/13/2018 10:53 AM 10.2 (H) 4.6 - 6.5 % Final    Comment:    Glycemic Control Guidelines for People with Diabetes:Non Diabetic:  <6%Goal of Therapy: <7%Additional Action Suggested:  >8%    CBG: Recent Labs  Lab 07/25/18 0110 07/25/18 0201 07/25/18 0343 07/25/18 0526 07/25/18 0835  GLUCAP 127* 110* 217* 197* 194*      App cct 36.5 min   Richardson Landry Lenora Gomes ACNP Maryanna Shape PCCM Pager [phone] till 1 pm If no answer page [phone] 07/25/2018, 10:21 AM

## 2018-07-25 NOTE — Progress Notes (Signed)
Wasted 57mL = 534mcg Fentanly from Continuous IV drip with Vilinda Boehringer, RN (oncomming night shift nurse)

## 2018-07-25 NOTE — Progress Notes (Signed)
Called by RN to advance ETT. On arrival ETT was at 21. RT advanced to 23 with positive color change on the capnographer with BBS

## 2018-07-25 NOTE — Progress Notes (Signed)
Pt weaning since 0915 today and is continuing to wean without difficulty. MD not wanting to extubate d/t pts copious secretions. RT will continue to monitor.

## 2018-07-25 NOTE — Progress Notes (Signed)
RT weaning pt on PCV/CPAP 5/5 with 40%. Pt tolerating well. RT will continue to monitor.

## 2018-07-25 NOTE — Progress Notes (Signed)
Pt noted to have TF in mouth and nares. CCM notified. Suctioned. TF stopped. OGT flushed and hooked to LIS. No residual noted.  Insulin gtt per GlucoStabilizer rate 0.0U/hr. Okay per CCM to stop insulin gtt also d/t TF stopped.  Temps rising. Environmental changes made. Continue to monitor.

## 2018-07-25 NOTE — Progress Notes (Signed)
Initial Nutrition Assessment  DOCUMENTATION CODES:   Obesity unspecified  INTERVENTION:   Tube Feeding:  Vital High Protein @ 60 ml/hr Pro-Stat 30 mL BID Provides 1640 kcals, 157 g of protein and 1210 mL of free water  NUTRITION DIAGNOSIS:   Inadequate oral intake related to acute illness as evidenced by NPO status.  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 Pt suffered cardiac arrest on 9/19 and was resuscitated, intubated, transferred to ICU and started on TTM. PMH includes DM, CAD/CABG, chronic diastolic heart failure, CKD II/III  Pt remains on vent support, weaning, possible extubation soon  TF held last night; per RN report, TF coming out of pt's mouth and nares.  Restarted TF at rate of 20 ml/hr, tolerating thus far Previously infusing at rate of 40 ml/hr, Pro-stat 30 mL BID  Weight relatively stable since admission  Labs: sodium 146, CBGs 110-217 Meds: decadron, precedex, lasix  Diet Order:   Diet Order            Diet NPO time specified  Diet effective now        Diet Carb Modified              EDUCATION NEEDS:   Not appropriate for education at this time  Skin:  Skin Assessment: Skin Integrity Issues: Skin Integrity Issues:: Incisions Incisions: neck  Last BM:  9/25  Height:   Ht Readings from Last 1 Encounters:  07/20/18 '5\' 8"'  (1.727 m)    Weight:   Wt Readings from Last 1 Encounters:  07/25/18 129.2 kg    Ideal Body Weight:  70 kg  BMI:  Body mass index is 43.31 kg/m.  Estimated Nutritional Needs:   Kcal:  2694-8546 kcals   Protein:  140-175 g  Fluid:  >/= 1.7 L   Kerman Passey MS, RD, LDN, CNSC [phone] Pager  [phone]  Weekend/On-Call Pager

## 2018-07-25 NOTE — Progress Notes (Signed)
OT Cancellation Note  Patient Details Name: Edward [name] MRN: [id number] DOB: 12/25/1940   Cancelled Treatment:    Reason Eval/Treat Not Completed: Patient not medically ready(Pt weaning from vent with plan to extubate later today.) Will follow.  [name] 07/25/2018, 9:41 AM  Nestor Lewandowsky, OTR/L Acute Rehabilitation Services Pager: [phone] Office: [phone]

## 2018-07-25 NOTE — Progress Notes (Signed)
RT called to pt room by RN d/t pt pulling tube out 2cm. RT deflated pilot balloon and advanced the tube to 2 cm to original position of 22cm @ lip. RT rechecked cuff pressure to be 26 post advancement of tube. Pt getting good VT post advance. Pt resting comfortably and stable. RT will continue to monitor.

## 2018-07-25 NOTE — Progress Notes (Signed)
PT Cancellation Note  Patient Details Name: Edward [name] MRN: [id number] DOB: 19-May-1941   Cancelled Treatment:    Reason Eval/Treat Not Completed: Patient not medically ready. Planned for extubation today. PT to return as able, as appropriate.  Kittie Plater, PT, DPT Acute Rehabilitation Services Pager #: [phone] Office #: [phone]    Berline Lopes 07/25/2018, 9:04 AM

## 2018-07-26 ENCOUNTER — Inpatient Hospital Stay (HOSPITAL_COMMUNITY): Payer: Medicare Other

## 2018-07-26 LAB — GLUCOSE, CAPILLARY
GLUCOSE-CAPILLARY: 205 mg/dL — AB (ref 70–99)
Glucose-Capillary: 147 mg/dL — ABNORMAL HIGH (ref 70–99)
Glucose-Capillary: 202 mg/dL — ABNORMAL HIGH (ref 70–99)
Glucose-Capillary: 228 mg/dL — ABNORMAL HIGH (ref 70–99)
Glucose-Capillary: 142 mg/dL — ABNORMAL HIGH (ref 70–99)
Glucose-Capillary: 102 mg/dL — ABNORMAL HIGH (ref 70–99)
Glucose-Capillary: 123 mg/dL — ABNORMAL HIGH (ref 70–99)

## 2018-07-26 LAB — BASIC METABOLIC PANEL
ANION GAP: 7 (ref 5–15)
BUN: 38 mg/dL — AB (ref 8–23)
CALCIUM: 8.5 mg/dL — AB (ref 8.9–10.3)
CO2: 31 mmol/L (ref 22–32)
CREATININE: 1.16 mg/dL (ref 0.61–1.24)
Chloride: 107 mmol/L (ref 98–111)
GFR calc Af Amer: >60 mL/min (ref >60)
GFR calc non Af Amer: 59 mL/min — ABNORMAL LOW (ref >60)
GLUCOSE: 254 mg/dL — AB (ref 70–99)
Potassium: 4 mmol/L (ref 3.5–5.1)
Sodium: 145 mmol/L (ref 135–145)

## 2018-07-26 LAB — CULTURE, RESPIRATORY: CULTURE: NORMAL

## 2018-07-26 LAB — MAGNESIUM: Magnesium: 2.4 mg/dL (ref 1.7–2.4)

## 2018-07-26 LAB — PHOSPHORUS: Phosphorus: 3.5 mg/dL (ref 2.5–4.6)

## 2018-07-26 MED ORDER — ORAL CARE MOUTH RINSE
15.0000 mL | Freq: Two times a day (BID) | OROMUCOSAL | Status: DC
  Administered 2018-07-26 – 2018-07-28 (×5): 15 mL via OROMUCOSAL

## 2018-07-26 MED ORDER — DEXAMETHASONE SODIUM PHOSPHATE 4 MG/ML IJ SOLN
1.0000 mg | Freq: Three times a day (TID) | INTRAMUSCULAR | Status: DC
  Administered 2018-07-26 – 2018-07-28 (×6): 1 mg via INTRAVENOUS
  Filled 2018-07-26 (×6): qty 1

## 2018-07-26 MED ORDER — CHLORHEXIDINE GLUCONATE 0.12 % MT SOLN
15.0000 mL | Freq: Two times a day (BID) | OROMUCOSAL | Status: DC
  Administered 2018-07-26 – 2018-07-28 (×4): 15 mL via OROMUCOSAL
  Filled 2018-07-26 (×2): qty 15

## 2018-07-26 MED ORDER — DEXAMETHASONE 0.5 MG PO TABS: 1.0000 mg | ORAL_TABLET | Freq: Three times a day (TID) | ORAL | Status: DC

## 2018-07-26 NOTE — Procedures (Signed)
Extubation Procedure Note  Patient Details:   Name: Edward [name] DOB: [birth year] MRN: [id number]   Airway Documentation:    Vent end date: 07/26/18 Vent end time: 1100   Evaluation  O2 sats: stable throughout Complications: No apparent complications Patient did tolerate procedure well. Bilateral Breath Sounds: Rhonchi, Diminished   Yes  Pt extubated to [place] 4L and doing well. Pt hoarse and rhonchi, a lot of upper airway secretions. Ordered flutter and IS.  Roby Lofts Xue Low 07/26/2018, 11:09 AM

## 2018-07-26 NOTE — Progress Notes (Signed)
Pt passed wean MD ordered pt to be extubated. Pt extubated at 1100 to [place] 4L.Marland Kitchen Pt tolerating well. RT will continue to monitor.

## 2018-07-26 NOTE — Progress Notes (Signed)
RT started pt wean at 0831 on PS/CPAP 5/5 and 40% FIO2. Pt tolerating well. Pt secretions remain tenacious and thick. RT will continue to monitor.

## 2018-07-26 NOTE — Progress Notes (Signed)
Neurosurgery Service Progress Note  Subjective: NAE ON, extubated without issue  Objective: Vitals:   07/26/18 1400 07/26/18 1500 07/26/18 1600 07/26/18 1625  BP: (!) 149/64 (!) 126/57 (!) 153/57   Pulse: 79 71 73   Resp: 12 17 15    Temp: 100 F (37.8 C) 100 F (37.8 C) 99.9 F (37.7 C)   TempSrc:   Core   SpO2: 94% 94% 95% 96%  Weight:      Height:       Temp (24hrs), Avg:99.9 F (37.7 C), Min:99.3 F (37.4 C), Max:100.2 F (37.9 C)  CBC Latest Ref Rng & Units 07/25/2018 07/24/2018 07/21/2018  WBC 4.0 - 10.5 K/uL 20.9(H) 18.5(H) 14.6(H)  Hemoglobin 13.0 - 17.0 g/dL 12.5(L) 11.6(L) 13.3  Hematocrit 39.0 - 52.0 % 39.5 36.6(L) 41.2  Platelets 150 - 400 K/uL 242 209 259   BMP Latest Ref Rng & Units 07/26/2018 07/25/2018 07/24/2018  Glucose 70 - 99 mg/dL 254(H) 227(H) 191(H)  BUN 8 - 23 mg/dL 38(H) 37(H) 32(H)  Creatinine 0.61 - 1.24 mg/dL 1.16 1.08 1.06  BUN/Creat Ratio 10 - 24 - - -  Sodium 135 - 145 mmol/L 145 146(H) 144  Potassium 3.5 - 5.1 mmol/L 4.0 4.2 4.2  Chloride 98 - 111 mmol/L 107 108 110  CO2 22 - 32 mmol/L 31 28 28   Calcium 8.9 - 10.3 mg/dL 8.5(L) 8.5(L) 7.9(L)    Intake/Output Summary (Last 24 hours) at 07/26/2018 1710 Last data filed at 07/26/2018 1134 Gross per 24 hour  Intake 597.02 ml  Output 2435 ml  Net -1837.98 ml    Current Facility-Administered Medications:  .  0.9 %  sodium chloride infusion, , Intravenous, Continuous, Aljishi, Virgina Norfolk, MD, Stopped at 07/23/18 2113 .  amLODipine (NORVASC) tablet 10 mg, 10 mg, Per Tube, Daily, Aljishi, Virgina Norfolk, MD, 10 mg at 07/26/18 1031 .  chlorhexidine (PERIDEX) 0.12 % solution 15 mL, 15 mL, Mouth Rinse, BID, Agarwala, Ravi, MD .  dexamethasone (DECADRON) injection 1 mg, 1 mg, Intravenous, Q8H, Agarwala, Ravi, MD, 1 mg at 07/26/18 1523 .  dicyclomine (BENTYL) 10 MG/5ML syrup 10 mg, 10 mg, Per Tube, BID, Aldean Jewett, MD, 10 mg at 07/26/18 1027 .  enoxaparin (LOVENOX) injection 40 mg, 40 mg, Subcutaneous, Q24H,  Salvadore Dom E, NP, 40 mg at 07/26/18 1040 .  erythromycin ophthalmic ointment 1 application, 1 application, Both Eyes, Daily PRN, Judith Part, MD .  famotidine (PEPCID) IVPB 20 mg premix, 20 mg, Intravenous, Q12H, Aljishi, Virgina Norfolk, MD, Last Rate: 100 mL/hr at 07/26/18 1039, 20 mg at 07/26/18 1039 .  feeding supplement (PRO-STAT SUGAR FREE 64) liquid 30 mL, 30 mL, Per Tube, BID, Salvadore Dom E, NP, 30 mL at 07/26/18 1029 .  feeding supplement (VITAL HIGH PROTEIN) liquid 1,000 mL, 1,000 mL, Per Tube, Q24H, Agarwala, Ravi, MD, 1,000 mL at 07/26/18 0926 .  fentaNYL (SUBLIMAZE) bolus via infusion 25 mcg, 25 mcg, Intravenous, Q1H PRN, Omar Person, NP, 25 mcg at 07/24/18 4944 .  fentaNYL 2565mcg in NS 266mL (40mcg/ml) infusion-PREMIX, 25-400 mcg/hr, Intravenous, Continuous, Eubanks, Katalina M, NP, Stopped at 07/24/18 1000 .  furosemide (LASIX) injection 40 mg, 40 mg, Intravenous, Daily, Kara Mead V, MD, 40 mg at 07/26/18 1026 .  hydrALAZINE (APRESOLINE) injection 10-20 mg, 10-20 mg, Intravenous, Q6H PRN, Rigoberto Noel, MD, 20 mg at 07/26/18 9675 .  hydrALAZINE (APRESOLINE) tablet 25 mg, 25 mg, Per Tube, Q8H, Rigoberto Noel, MD, 25 mg at 07/26/18 0636 .  insulin  aspart (novoLOG) injection 3-9 Units, 3-9 Units, Subcutaneous, Q4H, Minor, Grace Bushy, NP, 9 Units at 07/26/18 1217 .  insulin detemir (LEVEMIR) injection 20 Units, 20 Units, Subcutaneous, Q12H, Minor, Grace Bushy, NP, 20 Units at 07/26/18 1026 .  labetalol (NORMODYNE,TRANDATE) injection 10 mg, 10 mg, Intravenous, Q2H PRN, Aldean Jewett, MD, 10 mg at 07/24/18 1019 .  lactated ringers infusion, , Intravenous, Continuous, Belinda Block, MD, Last Rate: 10 mL/hr at 07/20/18 1500 .  MEDLINE mouth rinse, 15 mL, Mouth Rinse, q12n4p, Agarwala, Ravi, MD, 15 mL at 07/26/18 1540 .  morphine 2 MG/ML injection 1-2 mg, 1-2 mg, Intravenous, Q1H PRN, Flora Lipps, MD, 1 mg at 07/26/18 0411 .  polyethylene glycol (MIRALAX / GLYCOLAX) packet  17 g, 17 g, Oral, Daily, Judith Part, MD, 17 g at 07/24/18 9798 .  polyvinyl alcohol (LIQUIFILM TEARS) 1.4 % ophthalmic solution 1 drop, 1 drop, Both Eyes, TID, Aljishi, Virgina Norfolk, MD, 1 drop at 07/26/18 1528 .  prednisoLONE acetate (PRED FORTE) 1 % ophthalmic suspension 2 drop, 2 drop, Both Eyes, TID AC & HS, Shaneisha Burkel, Joyice Faster, MD, 2 drop at 07/26/18 1229 .  primidone (MYSOLINE) tablet 50 mg, 50 mg, Per Tube, BID, Erick Colace, NP, 50 mg at 07/26/18 1025 .  propranolol (INDERAL) tablet 40 mg, 40 mg, Oral, BID, Rigoberto Noel, MD, 40 mg at 07/26/18 1025 .  rosuvastatin (CRESTOR) tablet 20 mg, 20 mg, Per Tube, Once per day on Tue Thu, Babcock, Peter E, NP, 20 mg at 07/26/18 1039 .  senna-docusate (Senokot-S) tablet 2 tablet, 2 tablet, Oral, BID, Judith Part, MD, 2 tablet at 07/24/18 2105 .  sodium chloride HYPERTONIC 3 % nebulizer solution 4 mL, 4 mL, Nebulization, BID, Rigoberto Noel, MD, 4 mL at 07/26/18 1021   Physical Exam: Awake/alert, confused but interactive and answering questions, voice raspy but otherwise normal, PERRL, gaze conjugate, FCx4 reliably to command without preference Incision c/d/i, neck soft, no evidence of hematoma  Assessment & Plan: 77 y.o. man s/p 4 level ACDF, recovering well immediately post-op with resolution of radicular pain and weakness, 9/19 code for asystole of unclear cause, CT C-sp no hematoma, CTH neg. MRI C-spine shows no evidence of epidural hematoma, minimal soft tissue swelling, no hematoma, good canal decompression. 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 -extubated, appears to be doing well post-extubation -can discontinue dexamethasone  Judith Part  07/26/18 5:10 PM

## 2018-07-26 NOTE — Progress Notes (Signed)
NAME:  Edward [name], MRN:  [id number], DOB:  Apr 28, 1941, LOS: 40 ADMISSION DATE:  07/13/2018, CONSULTATION DATE: 9/19 REFERRING MD:  Roylene Reason, CHIEF COMPLAINT:  Cardiac arrest    Brief History   77 year old patient with history of diabetes with history of difficult glycemic control and cervical stenosis resulting in cord compression underwent anterior cervical disc fusion of cervical spine on September 16.    On 9/19 began to notice difficulty swallowing, not able to even swallow his own saliva.  Had been ordered Decadron but this was not given.  He was found by nursing staff unresponsive and in asystole.  He underwent 1 round of ACLS with successful return of spontaneous circulation.  Tx to ICU for hypothermia protocol.   Significant Hospital Events   9/16: Anterior cervical disc fusion 3/20: Asystolic arrest.  Estimated time to return of spontaneous circulation approximately 3 minutes.  Unresponsive not following commands postarrest so hypothermia protocol initiated.  Goal temperature met at: 1030 pm  9/20: Hemodynamically stable on low-dose norepinephrine 9/21: completed rewarming. Paralytic and sedation discontinued. 9/22: paralysis/sedation and vasopressor therapy discontinued yesterday. Restlessness developed last night. IV fentanyl started. Reqired iv labetalol to treat HTN. Has a h/o HTN. 9/24>> Developed fever Vanc/Zosyn started 9/25 weaned off sedation and pressors.  Weaning but copious secretions.  Consults: date of consult/date signed off & final recs:  Neuro 9/22>>> Cardiology 9/20>>> Procedures (surgical and bedside):  9/16: Anterior cervical disc fusion by neurosurgery 9/19: Oral endotracheal tube 9/19: Left internal jugular vein triple-lumen catheter>>> Significant Diagnostic Tests:  9/23 MRI Head Caudate and lentiform nuclei increased T2 signal and heterogeneous reduced diffusion compatible with hypoxic ischemic injury of the brain in the setting of cardiac  arrest. No hemorrhage, mass effect, or herniation 9/23 MRI cervical spine: No acute osseous or cord signal abnormality identified. Interval C3-C7 anterior cervical fusion and discectomy. Small prevertebral fluid collection extending into the right anterior neck compatible with postsurgical changes. No epidural collection. Mildly improved patency of the cervical spinal canal. Stable residual multilevel foraminal stenosis from uncovertebral and facet Hypertrophy. 9/23 MRI thoracic spine Mild thoracic spine spondylosis. No high-grade foraminal or canal Stenosis 9/19: CT brain noncontrasted: Negative CT cervical spine 9/19: Status post C3-C7 ACDF with expected postoperative changes.  No fracture or malalignment 2D echo 9/20 >>>Left ventricle: The cavity size was normal. There was severe   concentric hypertrophy. Systolic function was vigorous. The   estimated ejection fraction was in the range of 65% to 70%. Wall   motion was normal; there were no regional wall motion   abnormalities. Doppler parameters are consistent with abnormal   left ventricular relaxation (grade 1 diastolic dysfunction).   Doppler parameters are consistent with elevated mean left atrial   filling pressure. - Mitral valve: Valve area by pressure half-time: 2.04 cm^2. - Left atrium: The atrium was mildly dilated. Micro Data:  9/24>>Sputum >> GS>> Abundant WBC Moderate Gram + cocci, Few gram - rods  Antimicrobials:  Vanc 9/24>> Zosyn 9/24>>  Subjective:   Tolerated 5/5 SBT for 2h. Able to cough secretions into endotracheal tube.  Objective   Blood pressure (!) 159/65, pulse 66, temperature 100.2 F (37.9 C), resp. rate 13, height '5\' 8"'  (1.727 m), weight 123 kg, SpO2 94 %. CVP:  [3 mmHg-11 mmHg] (P) 3 mmHg  Vent Mode: PSV;CPAP FiO2 (%):  [40 %] 40 % Set Rate:  [14 bmp] 14 bmp Vt Set:  [550 mL] 550 mL PEEP:  [5 cmH20] 5 cmH20 Pressure Support:  [5 cmH20]  [place] Pressure:  [9 cmH20-16 cmH20] 9 cmH20    Intake/Output Summary (Last 24 hours) at 07/26/2018 1219 Last data filed at 07/26/2018 1134 Gross per 24 hour  Intake 672.59 ml  Output 3735 ml  Net -3062.41 ml   Filed Weights   07/24/18 0600 07/25/18 0500 07/26/18 0428  Weight: 132.5 kg 129.2 kg 123 kg    Examination: General: Obese male no acute distress HEENT: Endotracheal and gastric tube in place, No skin breakdown. Well healed surgical incision.  Neuro: Follows commands moves extremities, nods appropriately CV: Heart sounds are regular PULM: even/non-labored, lungs bilateral rhonchi have resolved.  ME:BRAX, non-tender, bsx4 active  Extremities: warm/dry, 1+ edema  Skin: no rashes or lesions    Resolved Hospital Problem list    Assessment & Plan:  Status post asystolic cardiac arrest.  Presumed hypoxia mediated given prior OSA and report of inadequate airway protection pre-arrest.  H/O coronary artery disease and CABG back in 2019 with chronic diastolic heart failure HTN  Plan Continue monitoring Diuresis as needed.  Ventilator dependence status post cardiopulmonary arrest H/o OSA  Plan Tolerated extubation. Progressive diet and ambulation.   ID  Aspiration pneumonia I do not see a convincing infiltrate on chest xray.  Cultures consistent with normal flora. MRSA PCR is negative. Stop antibiotics.  Continue to follow leukocytosis and fever.   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   Chronic kidney disease stage III Creatinine stable after lasix x 1 dose Lab Results  Component Value Date   CREATININE 1.16 07/26/2018   CREATININE 1.08 07/25/2018   CREATININE 1.06 07/24/2018   CREATININE 1.33 (H) 12/31/2015    Intake/Output Summary (Last 24 hours) at  07/26/2018 1219 Last data filed at 07/26/2018 1134 Gross per 24 hour  Intake 672.59 ml  Output 3735 ml  Net -3062.41 ml    Plan Monitor electrolytes Diuresis as tolerated - I&O as tolerated, note 3200 cc negative, this may help with extubation.   Diabetes   Plan  Transition to sliding scale insulin per protocol Note he is on steroids Tube feedings were stopped during the night for vomiting. 07/25/2018 trickle tube feeds monitor glucose transition to sliding scale insulin per protocol  Status post anterior cervical disc fusion 9/16 Plan Currently on Decadron 2 mg every 8 hours Wean steroids.   Disposition / Summary of Today's Plan 07/26/18   Continue to wean as able Consideration to trial extubation    Diet: enteral feeding Pain/Anxiety/Delirium protocol (if indicated): PAD protocol VAP protocol (if indicated) ordered 9/20 DVT prophylaxis: low molecular weight heparin  GI prophylaxis: H2 blockade Hyperglycemia protocol: Hyperglycemia protocol Mobility: Bedrest Code Status: Full code Family Communication: 07/25/2018 no family at bedside patient updated  Labs   CBC: Recent Labs  Lab 07/19/18 1716  07/19/18 2305 07/20/18 0110 07/21/18 0407 07/24/18 0422 07/25/18 0339  WBC 10.2  --   --   --  14.6* 18.5* 20.9*  NEUTROABS 7.4  --   --   --   --   --   --   HGB 18.1*   < > 13.9 13.6 13.3 11.6* 12.5*  HCT 57.3*   < > 41.0 40.0 41.2 36.6* 39.5  MCV 89.0  --   --   --  86.4 89.5 89.6  PLT  145*  --   --   --  259 209 242   < > = values in this interval not displayed.   Basic Metabolic Panel: Recent Labs  Lab 07/20/18 1638 07/20/18 2058  07/21/18 0407  07/21/18 1617  07/22/18 1324  07/23/18 2353 07/24/18 0422 07/24/18 0841 07/25/18 0339 07/26/18 0414  NA 141 139   < > 140   < > 141   < > 142   < > 144 145 144 146* 145  K 4.1 3.8   < > 3.5   < > 4.0   < > 4.3   < > 4.0 3.9 4.2 4.2 4.0  CL 106 105   < > 106   < > 107   < > 107   < > 108 110 110 108 107  CO2 25  25   < > 27   < > 26   < > 27   < > '28 28 28 28 31  ' GLUCOSE 177* 261*   < > 217*   < > 125*   < > 173*   < > 217* 200* 191* 227* 254*  BUN 11 13   < > 14   < > 21   < > 32*   < > 30* 33* 32* 37* 38*  CREATININE 0.76 0.79   < > 0.87   < > 1.29*   < > 1.44*   < > 1.08 1.12 1.06 1.08 1.16  CALCIUM 8.0* 8.0*   < > 7.9*   < > 7.8*   < > 7.7*   < > 7.8* 8.0* 7.9* 8.5* 8.5*  MG 1.8  --   --  2.0  --  2.0  --   --   --   --   --   --  2.5* 2.4  PHOS  --  3.4  --  2.6  --  3.5  --  4.0  --   --   --   --   --  3.5   < > = values in this interval not displayed.   GFR: Estimated Creatinine Clearance: 69.1 mL/min (by C-G formula based on SCr of 1.16 mg/dL). Recent Labs  Lab 07/19/18 1716 07/21/18 0407 07/24/18 0422 07/25/18 0339  PROCALCITON  --   --   --  0.60  WBC 10.2 14.6* 18.5* 20.9*  LATICACIDVEN 5.7*  --   --   --    Liver Function Tests: Recent Labs  Lab 07/19/18 1716  AST 135*  ALT 105*  ALKPHOS 89  BILITOT 1.0  PROT 6.6  ALBUMIN 3.2*   No results for input(s): LIPASE, AMYLASE in the last 168 hours. No results for input(s): AMMONIA in the last 168 hours. ABG    Component Value Date/Time   PHART 7.426 07/20/2018 0908   PCO2ART 43.1 07/20/2018 0908   PO2ART 126.0 (H) 07/20/2018 0908   HCO3 29.7 (H) 07/20/2018 0908   TCO2 31 07/20/2018 0908   ACIDBASEDEF 2.0 11/09/2016 2003   O2SAT 99.0 07/20/2018 0908    Coagulation Profile: Recent Labs  Lab 07/19/18 1858 07/20/18 0312  INR 1.16 1.20   Cardiac Enzymes: Recent Labs  Lab 07/19/18 1716 07/19/18 2314 07/20/18 0402 07/20/18 1119  TROPONINI <0.03 0.30* 0.25* 0.16*   HbA1C: Hgb A1c MFr Bld  Date/Time Value Ref Range Status  06/19/2018 10:56 AM 9.5 (H) 4.6 - 6.5 % Final    Comment:    Glycemic Control Guidelines for People with Diabetes:Non  Diabetic:  <6%Goal of Therapy: <7%Additional Action Suggested:  >8%   03/13/2018 10:53 AM 10.2 (H) 4.6 - 6.5 % Final    Comment:    Glycemic Control Guidelines for People  with Diabetes:Non Diabetic:  <6%Goal of Therapy: <7%Additional Action Suggested:  >8%    CBG: Recent Labs  Lab 07/25/18 1928 07/25/18 2311 07/26/18 0319 07/26/18 0738 07/26/18 1133  GLUCAP 147* 149* 205* 202* 228*   CRITICAL CARE Performed by: Kipp Brood   Total critical care time: 40 minutes  Critical care time was exclusive of separately billable procedures and treating other patients.  Critical care was necessary to treat or prevent imminent or life-threatening deterioration.  Critical care was time spent personally by me on the following activities: development of treatment plan with patient and/or surrogate as well as nursing, discussions with consultants, evaluation of patient's response to treatment, examination of patient, obtaining history from patient or surrogate, ordering and performing treatments and interventions, ordering and review of laboratory studies, ordering and review of radiographic studies, pulse oximetry and re-evaluation of patient's condition.  Kipp Brood, MD [facility] ICU Physician [place]  Pager: [phone] Mobile: [phone] After hours: [phone].  07/26/2018, 12:19 PM

## 2018-07-26 NOTE — Progress Notes (Signed)
Called by RN to advance ETT back to 23. On arrival ETT was at 18 tube was advanced to 23 with positive color change and bilateral breath sounds. RT changed ETT holder.

## 2018-07-26 NOTE — Progress Notes (Signed)
PT Cancellation Note  Patient Details Name: Edward [name] MRN: [id number] DOB: 11-08-1940   Cancelled Treatment:    Reason Eval/Treat Not Completed: Medical issues which prohibited therapy(pt remains on vent. Will sign off and await new order)   Dacey Milberger B Hatsue Sime 07/26/2018, 6:54 AM  Elwyn Reach, PT Acute Rehabilitation Services Pager: [phone] Office: [phone]

## 2018-07-26 NOTE — Progress Notes (Signed)
OT Cancellation Note  Patient Details Name: Edward [name] MRN: [id number] DOB: 19-Sep-1941   Cancelled Treatment:    Reason Eval/Treat Not Completed: Patient not medically ready(Remains intubated, will sign off and await new order.)  [name] 07/26/2018, 7:46 AM

## 2018-07-26 NOTE — Plan of Care (Signed)
  Problem: Education: Goal: Knowledge of General Education information will improve Description Including pain rating scale, medication(s)/side effects and non-pharmacologic comfort measures Outcome: Progressing   Problem: Clinical Measurements: Goal: Ability to maintain clinical measurements within normal limits will improve 07/26/2018 1825 by Julaine Hua, RN Outcome: Progressing 07/26/2018 1821 by Julaine Hua, RN Outcome: Progressing Goal: Diagnostic test results will improve 07/26/2018 1825 by Julaine Hua, RN Outcome: Progressing 07/26/2018 1821 by Julaine Hua, RN Outcome: Progressing Goal: Cardiovascular complication will be avoided 07/26/2018 1825 by Julaine Hua, RN Outcome: Progressing 07/26/2018 1821 by Julaine Hua, RN Outcome: Progressing   Problem: Safety: Goal: Ability to remain free from injury will improve 07/26/2018 1825 by Julaine Hua, RN Outcome: Progressing 07/26/2018 1821 by Julaine Hua, RN Outcome: Progressing   Problem: Skin Integrity: Goal: Risk for impaired skin integrity will decrease 07/26/2018 1825 by Julaine Hua, RN Outcome: Progressing 07/26/2018 1821 by Julaine Hua, RN Outcome: Progressing

## 2018-07-26 NOTE — Progress Notes (Signed)
Inpatient Diabetes Program Recommendations  AACE/ADA: New Consensus Statement on Inpatient Glycemic Control (2015)  Target Ranges:  Prepandial:   less than 140 mg/dL      Peak postprandial:   less than 180 mg/dL (1-2 hours)      Critically ill patients:  140 - 180 mg/dL   Lab Results  Component Value Date   GLUCAP 202 (H) 07/26/2018   HGBA1C 9.5 (H) 06/19/2018    Review of Glycemic Control Results for DAIVIK, OVERLEY (MRN [id number]) as of 07/26/2018 10:33  Ref. Range 07/25/2018 12:47 07/25/2018 17:15 07/25/2018 23:11 07/26/2018 07:38  Glucose-Capillary Latest Ref Range: 70 - 99 mg/dL 199 (H) 122 (H) 149 (H) 202 (H)   Diabetes history: Type 2 DM Outpatient Diabetes medications: NPH 30 units BID, Novolog 40 units TID Current orders for Inpatient glycemic control: Levemir 20 units BID Decadron 2 mg Q8H, Novolog 0-24 units Q4H  Inpatient Diabetes Program Recommendations:    Consider increasing Levemir to 25 units BID. Noted plan for weaning vent and tube feeds.  Thanks, Bronson Curb, MSN, RNC-OB Diabetes Coordinator [phone] (8a-5p)\

## 2018-07-27 LAB — GLUCOSE, CAPILLARY
GLUCOSE-CAPILLARY: 127 mg/dL — AB (ref 70–99)
Glucose-Capillary: 86 mg/dL (ref 70–99)
Glucose-Capillary: 144 mg/dL — ABNORMAL HIGH (ref 70–99)
Glucose-Capillary: 146 mg/dL — ABNORMAL HIGH (ref 70–99)
Glucose-Capillary: 165 mg/dL — ABNORMAL HIGH (ref 70–99)

## 2018-07-27 MED ORDER — SODIUM CHLORIDE 0.9% FLUSH
10.0000 mL | Freq: Two times a day (BID) | INTRAVENOUS | Status: DC
  Administered 2018-07-27 (×2): 10 mL
  Administered 2018-07-28: 40 mL

## 2018-07-27 MED ORDER — SODIUM CHLORIDE 0.9% FLUSH
10.0000 mL | INTRAVENOUS | Status: DC | PRN
  Administered 2018-07-27: 20 mL

## 2018-07-27 MED ORDER — PRO-STAT SUGAR FREE PO LIQD
30.0000 mL | Freq: Every day | ORAL | Status: DC
  Administered 2018-07-28 – 2018-07-30 (×3): 30 mL
  Filled 2018-07-27 (×3): qty 30

## 2018-07-27 MED ORDER — FREE WATER
50.0000 mL | Freq: Four times a day (QID) | Status: DC
  Administered 2018-07-27 – 2018-07-28 (×4): 50 mL

## 2018-07-27 MED ORDER — CHLORHEXIDINE GLUCONATE CLOTH 2 % EX PADS
6.0000 | MEDICATED_PAD | Freq: Every day | CUTANEOUS | Status: DC
  Administered 2018-07-27: 6 via TOPICAL

## 2018-07-27 MED ORDER — HYDRALAZINE HCL 20 MG/ML IJ SOLN
10.0000 mg | Freq: Three times a day (TID) | INTRAMUSCULAR | Status: DC
  Administered 2018-07-27 – 2018-07-28 (×2): 10 mg via INTRAVENOUS
  Filled 2018-07-27 (×2): qty 1

## 2018-07-27 MED ORDER — VITAL AF 1.2 CAL PO LIQD
1000.0000 mL | ORAL | Status: DC
  Administered 2018-07-27 – 2018-07-30 (×4): 1000 mL

## 2018-07-27 NOTE — Progress Notes (Signed)
Nutrition Follow-up  DOCUMENTATION CODES:   Obesity unspecified  INTERVENTION:  - Will adjust TF order: Vital AF 1.2 @ 65 mL/hr with 30 mL Prostat once/day and 50 mL free water QID. This regimen provides 1972 kcal, 132 grams of protein, and 1465 mL free water.   NUTRITION DIAGNOSIS:   Inadequate oral intake related to acute illness as evidenced by NPO status. -ongoing  GOAL:   Patient will meet greater than or equal to 90% of their needs -unmet/unmable to meet at this time.   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 Pt suffered cardiac arrest on 9/19 and was resuscitated, intubated, transferred to ICU and started on TTM. PMH includes DM, CAD/CABG, chronic diastolic heart failure, CKD II/III  Patient was extubated 9/26. SLP saw patient for bedside swallow evaluation this AM and recommended NPO status. Cortrak placed by this RD a short time ago. Estimated nutrition needs updated. Will order TF as outlined above.  Per Edward [name], CCM NP, note from this AM: patient with aspiration PNA with high risk for aspiration. Patient's speech was incomprehensible during RD visit this afternoon. No family/visitors at bedside.    Medications reviewed; 20 mg IV Pepcid BID, 40 mg IV Lasix/day, sliding scale Novolog, 20 units Levemir BID, 1 packet Miralax/day, 2 tablets Senokot BID.  Labs reviewed; CBGs: 86, 144, and 127 mg/dL today.     Diet Order:   Diet Order            Diet NPO time specified  Diet effective now        Diet Carb Modified              EDUCATION NEEDS:   Not appropriate for education at this time  Skin:  Skin Assessment: Skin Integrity Issues: Skin Integrity Issues:: Incisions Incisions: neck  Last BM:  9/27  Height:   Ht Readings from Last 1 Encounters:  07/20/18 5\' 8"  (1.727 m)    Weight:   Wt Readings from Last 1  Encounters:  07/27/18 123.2 kg    Ideal Body Weight:  70 kg  BMI:  Body mass index is 41.3 kg/m.  Estimated Nutritional Needs:   Kcal:  8144-8185  Protein:  125-135 grams  Fluid:  >/= 1.7 L     Edward Matin, MS, RD, LDN, [facility] Inpatient Clinical Dietitian Pager # [phone] After hours/weekend pager # [phone]

## 2018-07-27 NOTE — Progress Notes (Signed)
Cortrak Tube Team Note:  Consult received to place a Cortrak feeding tube.   A 10 F Cortrak tube was placed in the R nare and secured with a nasal clip and tape at 72 cm. Per the Cortrak monitor reading the tube tip is in the stomach .   No x-ray is required. RN may begin using tube.   If the tube becomes dislodged please keep the tube and contact the Cortrak team at [email or web address] (password [email or web address]) for replacement.  If after hours and replacement cannot be delayed, place a NG tube and confirm placement with an abdominal x-ray.     [name], MS, RD, LDN, [facility] Inpatient Clinical Dietitian Pager # [phone] After hours/weekend pager # [phone]

## 2018-07-27 NOTE — Progress Notes (Addendum)
NAME:  Edward [name], MRN:  [id number], DOB:  04-22-1941, LOS: 70 ADMISSION DATE:  07/13/2018, CONSULTATION DATE: 9/19 REFERRING MD:  Roylene Reason, CHIEF COMPLAINT:  Cardiac arrest    Brief History   77 year old patient with history of diabetes with history of difficult glycemic control and cervical stenosis resulting in cord compression underwent anterior cervical disc fusion of cervical spine on September 16.    On 9/19 began to notice difficulty swallowing, not able to even swallow his own saliva.  Had been ordered Decadron but this was not given.  He was found by nursing staff unresponsive and in asystole.  He underwent 1 round of ACLS with successful return of spontaneous circulation.  Tx to ICU for hypothermia protocol.   Significant Hospital Events   9/16: Anterior cervical disc fusion 5/18: Asystolic arrest.  Estimated time to return of spontaneous circulation approximately 3 minutes.  Unresponsive not following commands postarrest so hypothermia protocol initiated.  Goal temperature met at: 1030 pm  9/20: Hemodynamically stable on low-dose norepinephrine 9/21: completed rewarming. Paralytic and sedation discontinued. 9/22: paralysis/sedation and vasopressor therapy discontinued yesterday. Restlessness developed last night. IV fentanyl started. Reqired iv labetalol to treat HTN. Has a h/o HTN. 9/24>> Developed fever Vanc/Zosyn started 9/25 weaned off sedation and pressors.  Weaning but copious secretions. 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 No hemorrhage, mass effect, or herniation 9/23 MRI cervical spine: No acute osseous or cord signal abnormality identified. Interval C3-C7 anterior cervical fusion and discectomy. Small prevertebral fluid collection extending into the right anterior neck compatible with postsurgical changes. No epidural collection. Mildly improved patency of the cervical spinal canal. Stable residual multilevel foraminal stenosis from uncovertebral and facet Hypertrophy. 9/23 MRI thoracic spine Mild thoracic spine spondylosis. No high-grade foraminal or canal Stenosis 9/19: CT brain noncontrasted: Negative CT cervical spine 9/19: Status post C3-C7 ACDF with expected postoperative changes.  No fracture or malalignment 2D echo 9/20 >>>Left ventricle: The cavity size was normal. There was severe   concentric hypertrophy. Systolic function was vigorous. The   estimated ejection fraction was in the range of 65% to 70%. Wall   motion was normal; there were no regional wall motion   abnormalities. Doppler parameters are consistent with abnormal   left ventricular relaxation (grade 1 diastolic dysfunction).   Doppler parameters are consistent with elevated mean left atrial   filling pressure. - Mitral valve: Valve area by pressure half-time: 2.04 cm^2. - Left atrium: The atrium was mildly dilated. Micro Data:  9/24>>Sputum >> GS>> Abundant WBC Moderate Gram + cocci, Few gram - rods>> Normal Flora  Antimicrobials:  Vanc 9/24>> 9/26 Zosyn 9/24>> 9/26  Subjective:  Extubated 9/26, and on RA with sat of 96%. Pt. Has a very weak cough. He states he feels better , but not "great:"   Objective   Blood pressure (!) 161/55, pulse 81, temperature (!) 97.4 F (36.3 C), temperature source Oral, resp. rate 16, height '5\' 8"'  (1.727 m), weight 123.2 kg, SpO2 98 %. CVP:  [3 mmHg-4 mmHg] 4 mmHg  FiO2 (%):  [  40 %] 40 %   Intake/Output Summary  (Last 24 hours) at 07/27/2018 0941 Last data filed at 07/27/2018 0900 Gross per 24 hour  Intake 441.32 ml  Output 3925 ml  Net -3483.68 ml   Filed Weights   07/25/18 0500 07/26/18 0428 07/27/18 0408  Weight: 129.2 kg 123 kg 123.2 kg    Examination: General: Obese male , OOB in chair on RA in NAD, weak per his own admission HEENT: NCAT, No skin breakdown. Well healed surgical incision.No LAD, thick neck  Neuro: Awake and alert, Oriented to self and place, MAE x 4, Follows commands,  CV:S1, S2, RRR, No RMG PULM: Bilateral excursion, Coarse with rhonchi throughout, weak cough FG:HWEX, BS +, NT, ND, Obese Extremities: warm/dry, 1+ edema , no obvious deformities Skin: no rashes or lesions, warm, dry and intact    Resolved Hospital Problem list   Respiratory failure Metabolic encephalopathy Assessment & Plan:  Status post asystolic cardiac arrest.  Presumed hypoxia mediated given prior OSA and report of inadequate airway protection pre-arrest. H/O coronary artery disease and CABG back in 2019 with chronic diastolic heart failure HTN  Net negative 5 L since admission Plan Tele monitoring Diuresis as needed.  Ventilator dependence status post cardiopulmonary arrest>> resolved H/o OSA  Plan Tolerated extubation 9/26 Weak cough>> at risk for airway protection issues  Failed Swallow 9/27 Plan: Maintain saturations > 94% Aggressive Pulmonary toilet  OOB to chair NPO Place Cor Track  ID Aspiration pneumonia CXR resolving ABX discontinued 9/26 No labs 9/27 to evaluate WBC No CXR 9/27  High risk aspiration as failed swallow  Plan Trend PCT prn CBC 9/28 CXR 9/28 Continue to follow leukocytosis and fever.  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 Awake and alert  Plan: Ongoing Monitoring  mental status    Chronic kidney disease stage III Plan: Trend BMET>> ordered for 9/28 Monitor UO Avoid nephrotoxic medications  GI Failed Swallow 9/27 Weak cough>> high risk aspiration Weak and deconditioned  Plan: Place Cor Track Resume Tube feeds at previous rate Speech therapy for continued evaluation and support  Lab Results  Component Value Date   CREATININE 1.16 07/26/2018   CREATININE 1.08 07/25/2018   CREATININE 1.06 07/24/2018   CREATININE 1.33 (H) 12/31/2015    Intake/Output Summary (Last 24 hours) at 07/27/2018 0941 Last data filed at 07/27/2018 0900 Gross per 24 hour  Intake 441.32 ml  Output 3925 ml  Net -3483.68 ml     Diabetes   Plan  Transition to sliding scale insulin per protocol Note he is on steroids Tube feedings were stopped during the night for vomiting. 07/25/2018 trickle tube feeds monitor glucose transition to sliding scale insulin per protocol  Status post anterior cervical disc fusion 9/16 Plan Currently on Decadron 2 mg every 8 hours Wean steroids.   Disposition / Summary of Today's Plan 07/27/18   Transfer to Step Down Bed : weak cough and failed swallow >> will need careful monitoring , Cor Track and TF. Likely to be slow to pass swallow with recent cervical surgery and intubation Continue PT/OT and aggressive monitoring of I&O    Diet: enteral feeding Pain/Anxiety/Delirium protocol (if indicated): PAD protocol VAP protocol (if indicated) ordered 9/20 DVT  prophylaxis: low molecular weight heparin  GI prophylaxis: H2 blockade Hyperglycemia protocol: Hyperglycemia protocol Mobility: Bedrest Code Status: Full code Family Communication: 07/25/2018 no family at bedside patient updated  Labs   CBC: Recent Labs  Lab 07/21/18 0407 07/24/18 0422 07/25/18 0339  WBC 14.6* 18.5* 20.9*  HGB  13.3 11.6* 12.5*  HCT 41.2 36.6* 39.5  MCV 86.4 89.5 89.6  PLT 259 209 320   Basic Metabolic Panel: Recent Labs  Lab 07/20/18 1638 07/20/18 2058  07/21/18 0407  07/21/18 1617  07/22/18 0437  07/23/18 2353 07/24/18 0422 07/24/18 0841 07/25/18 0339 07/26/18 0414  NA 141 139   < > 140   < > 141   < > 142   < > 144 145 144 146* 145  K 4.1 3.8   < > 3.5   < > 4.0   < > 4.3   < > 4.0 3.9 4.2 4.2 4.0  CL 106 105   < > 106   < > 107   < > 107   < > 108 110 110 108 107  CO2 25 25   < > 27   < > 26   < > 27   < > '28 28 28 28 31  ' GLUCOSE 177* 261*   < > 217*   < > 125*   < > 173*   < > 217* 200* 191* 227* 254*  BUN 11 13   < > 14   < > 21   < > 32*   < > 30* 33* 32* 37* 38*  CREATININE 0.76 0.79   < > 0.87   < > 1.29*   < > 1.44*   < > 1.08 1.12 1.06 1.08 1.16  CALCIUM 8.0* 8.0*   < > 7.9*   < > 7.8*   < > 7.7*   < > 7.8* 8.0* 7.9* 8.5* 8.5*  MG 1.8  --   --  2.0  --  2.0  --   --   --   --   --   --  2.5* 2.4  PHOS  --  3.4  --  2.6  --  3.5  --  4.0  --   --   --   --   --  3.5   < > = values in this interval not displayed.   GFR: Estimated Creatinine Clearance: 69.2 mL/min (by C-G formula based on SCr of 1.16 mg/dL). Recent Labs  Lab 07/21/18 0407 07/24/18 0422 07/25/18 0339  PROCALCITON  --   --  0.60  WBC 14.6* 18.5* 20.9*   Liver Function Tests: No results for input(s): AST, ALT, ALKPHOS, BILITOT, PROT, ALBUMIN in the last 168 hours. No results for input(s): LIPASE, AMYLASE in the last 168 hours. No results for input(s): AMMONIA in the last 168 hours. ABG    Component Value Date/Time   PHART 7.426 07/20/2018 0908   PCO2ART 43.1 07/20/2018 0908   PO2ART 126.0 (H) 07/20/2018 0908   HCO3 29.7 (H) 07/20/2018 0908   TCO2 31 07/20/2018 0908   ACIDBASEDEF 2.0 11/09/2016 2003   O2SAT 99.0 07/20/2018 0908    Coagulation Profile: No results for input(s): INR, PROTIME in the last 168 hours. Cardiac Enzymes: Recent Labs  Lab 07/20/18 1119  TROPONINI 0.16*   HbA1C: Hgb  A1c MFr Bld  Date/Time Value Ref Range Status  06/19/2018 10:56 AM 9.5 (H) 4.6 - 6.5 % Final    Comment:    Glycemic Control  Guidelines for People with Diabetes:Non Diabetic:  <6%Goal of Therapy: <7%Additional Action Suggested:  >8%   03/13/2018 10:53 AM 10.2 (H) 4.6 - 6.5 % Final    Comment:    Glycemic Control Guidelines for People with Diabetes:Non Diabetic:  <6%Goal of Therapy: <7%Additional Action Suggested:  >8%    CBG: Recent Labs  Lab 07/26/18 1643 07/26/18 1938 07/26/18 2320 07/27/18 0407 07/27/18 0732  GLUCAP 142* 102* 123* 86 144*    Magdalen Spatz, AGACNP-BC [facility] [place] [facility]  Pager: [phone] After hours: [phone].  07/27/2018, 9:41 AM

## 2018-07-27 NOTE — Evaluation (Signed)
Physical Therapy Evaluation Patient Details Name: Edward [name] MRN: [id number] DOB: 1941-09-18 Today's Date: 07/27/2018   History of Present Illness  pt is a 77 y/o male admitted with neck pain and arm weakness s/p C3-7 aCDF on 9/16. Pt with aystole on 9/19, VDRF 9/19-9/26. PMhx: CAD, CKD, DM2, CABG, HTN  Clinical Impression  Pt with flat affect, garbled speech, confusion, weakness, impaired balance and function with significant decline in mobility and safety since initial eval. Pt will benefit from acute therapy to maximize strength, balance, transfers and safety to decrease burden of care. Pt currently max assist for transfers and recommend maxi sky for bed<>chair with nursing.  HR 82 Pre BP 153/84, post BP 161/63 97% RA    Follow Up Recommendations SNF;Supervision/Assistance - 24 hour    Equipment Recommendations  None recommended by PT    Recommendations for Other Services       Precautions / Restrictions Precautions Precautions: Fall;Cervical Restrictions Weight Bearing Restrictions: (P) No      Mobility  Bed Mobility Overal bed mobility: Needs Assistance Bed Mobility: Rolling;Sidelying to Sit Rolling: Max assist Sidelying to sit: Max assist       General bed mobility comments: HOB 30 degrees with assist to bend left knee and roll to right with physical assist to complete all aspects of rolling and side to sit to bring trunk up and bring legs off of bed  Transfers Overall transfer level: Needs assistance   Transfers: Sit to/from Stand Sit to Stand: From elevated surface;Max assist;+2 physical assistance Stand pivot transfers: Max assist;+2 physical assistance       General transfer comment: max assist with bil knees blocked to stand from surface with cues for hand placement. In standing pt pivoting to right with max assist to weight shift and advance bil LE  Ambulation/Gait                Stairs            Wheelchair Mobility    Modified  Rankin (Stroke Patients Only)       Balance Overall balance assessment: Needs assistance Sitting-balance support: Feet supported;Bilateral upper extremity supported Sitting balance-Leahy Scale: Fair Sitting balance - Comments: able to sit EOB with bil UE support     Standing balance-Leahy Scale: Poor Standing balance comment: assist to maintain balance and standing                             Pertinent Vitals/Pain Pain Assessment: No/denies pain    Home Living Family/patient expects to be discharged to:: Skilled nursing facility Living Arrangements: Alone Available Help at Discharge: Family;Available PRN/intermittently Type of Home: House Home Access: Stairs to enter Entrance Stairs-Rails: Right Entrance Stairs-Number of Steps: 3 steps Home Layout: One level Home Equipment: Walker - 4 wheels;Cane - single point;Shower seat      Prior Function Level of Independence: Independent         Comments: Still driving; enjoys church; has a cane, does not use; house cleaner comes twice a month      Hand Dominance   Dominant Hand: Right    Extremity/Trunk Assessment   Upper Extremity Assessment Upper Extremity Assessment: RUE deficits/detail;LUE deficits/detail;Difficult to assess due to impaired cognition RUE Deficits / Details: 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 but very weak and very limited use of hands today LUE Deficits / Details: LT shoulder limited by OA very weak, very limited grip and use of LUE during function  Lower Extremity Assessment Lower Extremity Assessment: Generalized weakness;RLE deficits/detail;LLE deficits/detail RLE Deficits / Details: grossly 3/5 functionally demonstrating 2/5     Cervical / Trunk Assessment Cervical / Trunk Assessment: Kyphotic  Communication   Communication: No difficulties  Cognition Arousal/Alertness: Awake/alert Behavior During Therapy: Flat affect Overall Cognitive Status: Impaired/Different from baseline Area of  Impairment: Attention;Memory;Following commands;Orientation;Safety/judgement;Problem solving                 Orientation Level: Disoriented to;Time;Situation;Place Current Attention Level: Focused Memory: Decreased recall of precautions;Decreased short-term memory Following Commands: Follows one step commands inconsistently;Follows one step commands with increased time Safety/Judgement: Decreased awareness of safety;Decreased awareness of deficits   Problem Solving: Slow processing;Decreased initiation;Difficulty sequencing;Requires verbal cues;Requires tactile cues General Comments: pt with flat affect, slow to respond to questions and commands. Pt with very garbled speech, disoriented and wanting to see his attorney. He was stating other things but unclear what his statement was      General Comments      Exercises General Exercises - Lower Extremity Long Arc Quad: AAROM;10 reps;Seated;Both   Assessment/Plan    PT Assessment Patient needs continued PT services  PT Problem List Decreased strength;Decreased activity tolerance;Decreased balance;Decreased mobility;Decreased knowledge of use of DME;Decreased knowledge of precautions;Pain;Decreased range of motion;Decreased safety awareness;Decreased coordination;Decreased cognition       PT Treatment Interventions DME instruction;Gait training;Functional mobility training;Therapeutic activities;Balance training;Patient/family education;Therapeutic exercise;Cognitive remediation;Neuromuscular re-education    PT Goals (Current goals can be found in the Care Plan section)  Acute Rehab PT Goals Patient Stated Goal: be able to move, brush my teeth PT Goal Formulation: With patient Time For Goal Achievement: 2018-09-03 Potential to Achieve Goals: Fair    Frequency Min 3X/week   Barriers to discharge Decreased caregiver support      Co-evaluation               AM-PAC PT "6 Clicks" Daily Activity  Outcome Measure  Difficulty turning over in bed (including adjusting bedclothes, sheets and blankets)?: Unable Difficulty moving from lying on back to sitting on the side of the bed? : Unable Difficulty sitting down on and standing up from a chair with arms (e.g., wheelchair, bedside commode, etc,.)?: Unable Help needed moving to and from a bed to chair (including a wheelchair)?: Total Help needed walking in hospital room?: Total Help needed climbing 3-5 steps with a railing? : Total 6 Click Score: 6    End of Session Equipment Utilized During Treatment: Gait belt Activity Tolerance: Patient tolerated treatment well Patient left: in chair;with call bell/phone within reach;with chair alarm set Nurse Communication: Mobility status;Need for lift equipment;Precautions PT Visit Diagnosis: Other abnormalities of gait and mobility (R26.89);Muscle weakness (generalized) (M62.81);Unsteadiness on feet (R26.81);Other symptoms and signs involving the nervous system (P49.826)    Time: 4158-3094 PT Time Calculation (min) (ACUTE ONLY): 28 min   Charges:   PT Evaluation $PT Eval Moderate Complexity: [place], PT Acute Rehabilitation Services Pager: [phone] Office: [phone]   Rodolfo Notaro B Reha Martinovich 07/27/2018, 9:24 AM

## 2018-07-27 NOTE — Progress Notes (Signed)
Patient found with cortrak dislodged down to the 10 cm mark.  Cortrak team has left for the day so unable to have tube replaced.  Will have RN discuss with MD during rounds.

## 2018-07-27 NOTE — Evaluation (Signed)
Clinical/Bedside Swallow Evaluation Patient Details  Name: Edward [name] MRN: [id number] Date of Birth: Jun 19, 1941  Today's Date: 07/27/2018 Time: SLP Start Time (ACUTE ONLY): 84 SLP Stop Time (ACUTE ONLY): 0927 SLP Time Calculation (min) (ACUTE ONLY): 12 min  Past Medical History:  Past Medical History:  Diagnosis Date  . Adenomatous colon polyp 04/2011  . CAD (coronary artery disease)    a. 01/2015 DES to LAD  b. 12/2015: [place] 85% oLCx lesion--> rx therapy.  . Chronic diastolic CHF (congestive heart failure) ([place])    a. 05/2017 Echo: EF 60-65%, Gr1 DD, no rwma, mildly dil LA, nl RV fxn, mild TR.  . CKD (chronic kidney disease), stage II    a. probable CKD II-III with baseline CR 1.1-1.3.  . DM type 2 (diabetes mellitus, type 2), insulin dependent 01/29/2014   fasting cbg 50-120 with new regimen  . Dyslipidemia, goal LDL below 70 01/29/2014  . Enlarged prostate   . Essential tremor    a. on proprnolol  . GERD (gastroesophageal reflux disease)   . Hx of CABG   . Hypertension   . Internal hemorrhoid   . Sleep apnea    does not use cpap (06/04/2015)  . TIA (transient ischemic attack) 2002   Past Surgical History:  Past Surgical History:  Procedure Laterality Date  . ANTERIOR CERVICAL DECOMPRESSION/DISCECTOMY FUSION 4 LEVELS N/A 07/16/2018   Procedure: CERVICAL THREE-FOUR CERVICAL FOUR-FIVE CERVICAL FIVE-SIX CERVICAL SIX-SEVEN ANTERIOR CERVICAL DECOMPRESSION/DISCECTOMY/FUSION;  Surgeon: Judith Part, MD;  Location: [facility];  Service: Neurosurgery;  Laterality: N/A;  CERVICAL THREE-FOUR CERVICAL FOUR-FIVE CERVICAL FIVE-SIX CERVICAL SIX-SEVEN ANTERIOR CERVICAL DECOMPRESSION/DISCECTOMY/FUSION  . BACK SURGERY    . CARDIAC CATHETERIZATION  01/28/14   + CAD treat medically  . CARDIAC CATHETERIZATION N/A 12/21/2015   Procedure: Right/Left Heart Cath and Coronary Angiography;  Surgeon: Belva Crome, MD;  Location: [place] [facility];  Service: Cardiovascular;  Laterality: N/A;  . CARDIAC  CATHETERIZATION N/A 11/02/2016   Procedure: Left Heart Cath and Coronary Angiography;  Surgeon: Nelva Bush, MD;  Location: [place] [facility];  Service: Cardiovascular;  Laterality: N/A;  . CATARACT EXTRACTION Bilateral   . CATARACT EXTRACTION, BILATERAL Bilateral   . CORONARY ANGIOPLASTY WITH STENT PLACEMENT  01/30/2015   DES [place]  [place] to LAD by Dr Tamala Julian  . CORONARY ARTERY BYPASS GRAFT N/A 11/09/2016   Procedure: CORONARY ARTERY BYPASS GRAFTING (CABG) x 2;  Surgeon: Melrose Nakayama, MD;  Location: [place];  Service: Open Heart Surgery;  Laterality: N/A;  . ENDOVEIN HARVEST OF GREATER SAPHENOUS VEIN Left 11/09/2016   Procedure: ENDOVEIN HARVEST OF GREATER SAPHENOUS VEIN;  Surgeon: Melrose Nakayama, MD;  Location: [place];  Service: Open Heart Surgery;  Laterality: Left;  . JOINT REPLACEMENT    . LEFT HEART CATH AND CORS/GRAFTS ANGIOGRAPHY N/A 03/09/2018   Procedure: LEFT HEART CATH AND CORS/GRAFTS ANGIOGRAPHY;  Surgeon: Belva Crome, MD;  Location: [place] [facility];  Service: Cardiovascular;  Laterality: N/A;  . LEFT HEART CATHETERIZATION WITH CORONARY ANGIOGRAM N/A 01/28/2014   Procedure: LEFT HEART CATHETERIZATION WITH CORONARY ANGIOGRAM;  Surgeon: Sinclair Grooms, MD;  Location[facility] CATH LAB;  Service: Cardiovascular;  Laterality: N/A;  . LEFT HEART CATHETERIZATION WITH CORONARY ANGIOGRAM N/A 01/30/2015   Procedure: LEFT HEART CATHETERIZATION WITH CORONARY ANGIOGRAM;  Surgeon: Belva Crome, MD;  Location: [facility] CATH LAB;  Service: Cardiovascular;  Laterality: N/A;  . LUMBAR [place]    . SHOULDER ARTHROSCOPY Right 07/30/2014   Procedure:  Right Shoulder Arthroscopy, Debridement, Decompression, Manipulation Under Anesthesia;  Surgeon: Newt Minion, MD;  Location: [place];  Service: Orthopedics;  Laterality: Right;  . TEE WITHOUT CARDIOVERSION N/A 11/09/2016   Procedure: TRANSESOPHAGEAL ECHOCARDIOGRAM (TEE);  Surgeon: Melrose Nakayama, MD;  Location: [place];  Service: Open Heart  Surgery;  Laterality: N/A;  . 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  On 9/19 began to notice difficulty swallowing and dysphonia, not able to even swallow his own saliva.  Had been ordered Decadron but this was not given.  He was found by nursing staff unresponsive and in asystole.  He underwent 1 round of ACLS with successful return of spontaneous circulation after 3 minutes. Possible anoxic injury. Iintubated from 9/19 to 9/26. CT shows no hematoma, but does show soft tissue swelling in pharyngeal wall.    Assessment / Plan / Recommendation Clinical Impression  Pt demonstrates impaired airway protection with suspected audible standing pharyngeal secretions and weak mechanism to clear or expectoration. Pts vocal quality is hoarse and there are signs of generalized deconditioning and weakness. These findings combined with known dysphagia following ACDF and 7 day intubation put the pt at high risk of aspiration. Though confused pt is alert and able to follow simple commands, can initiate a swallow. Progonsis for return of swallow function over the next week is good, but pt will need more time post extubation to recover glottic compentency and cough strength prior to attempting MBS. Recommend alternate means of nutrition over the weekend with f/u Monday for PO trials and potential MBS.  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  On 9/19 began to notice difficulty swallowing and dysphonia, not able to even swallow his own saliva.  Had been ordered Decadron but this was not given.  He was found by nursing staff unresponsive and in asystole.  He underwent 1 round of ACLS with successful return of spontaneous circulation after 3 minutes. Possible anoxic injury. Iintubated from 9/19 to 9/26. CT shows no hematoma, but does show soft tissue swelling in pharyngeal wall.  Type of Study: Bedside Swallow Evaluation Previous Swallow Assessment: none Diet Prior to this Study: NPO Temperature Spikes Noted: No Respiratory Status: Nasal cannula History of Recent Intubation: Yes Length of Intubations (days): 7 days Date extubated: 07/26/18 Behavior/Cognition: Alert;Cooperative;Requires cueing Oral Cavity Assessment: Within Functional Limits Oral Care Completed by SLP: Recent completion by staff Oral Cavity - Dentition: Missing dentition Vision: Functional for self-feeding Self-Feeding Abilities: Total assist Patient Positioning: Upright in chair Baseline Vocal Quality: Low vocal intensity;Hoarse Volitional Cough: Congested Volitional Swallow: Able to elicit    Oral/Motor/Sensory Function Overall Oral Motor/Sensory Function: Generalized oral weakness   Ice Chips  Ice chips: Impaired Presentation: Spoon Pharyngeal Phase Impairments: Multiple swallows;Cough - Delayed;Decreased hyoid-laryngeal movement   Thin Liquid      Nectar Thick     Honey Thick     Puree     Solid           [name], MA CCC-SLP  Acute Rehabilitation Services Pager [phone] Office [phone]  [name] 07/27/2018,10:44 AM

## 2018-07-27 NOTE — Progress Notes (Signed)
Neurosurgery Service Progress Note  Subjective: No acute events overnight  Objective: Vitals:   07/27/18 0700 07/27/18 0800 07/27/18 0825 07/27/18 0858  BP: (!) 175/72 (!) 179/63 (!) 153/64 (!) 158/63  Pulse: 91 85  69  Resp: 14 13  15   Temp: 99.1 F (37.3 C) (!) 97.4 F (36.3 C)    TempSrc:  Oral    SpO2: 96% 96%  96%  Weight:      Height:       Temp (24hrs), Avg:99.6 F (37.6 C), Min:97.4 F (36.3 C), Max:100 F (37.8 C)  CBC Latest Ref Rng & Units 07/25/2018 07/24/2018 07/21/2018  WBC 4.0 - 10.5 K/uL 20.9(H) 18.5(H) 14.6(H)  Hemoglobin 13.0 - 17.0 g/dL 12.5(L) 11.6(L) 13.3  Hematocrit 39.0 - 52.0 % 39.5 36.6(L) 41.2  Platelets 150 - 400 K/uL 242 209 259   BMP Latest Ref Rng & Units 07/26/2018 07/25/2018 07/24/2018  Glucose 70 - 99 mg/dL 254(H) 227(H) 191(H)  BUN 8 - 23 mg/dL 38(H) 37(H) 32(H)  Creatinine 0.61 - 1.24 mg/dL 1.16 1.08 1.06  BUN/Creat Ratio 10 - 24 - - -  Sodium 135 - 145 mmol/L 145 146(H) 144  Potassium 3.5 - 5.1 mmol/L 4.0 4.2 4.2  Chloride 98 - 111 mmol/L 107 108 110  CO2 22 - 32 mmol/L 31 28 28   Calcium 8.9 - 10.3 mg/dL 8.5(L) 8.5(L) 7.9(L)    Intake/Output Summary (Last 24 hours) at 07/27/2018 0914 Last data filed at 07/27/2018 0825 Gross per 24 hour  Intake 431.32 ml  Output 3925 ml  Net -3493.68 ml    Current Facility-Administered Medications:  .  0.9 %  sodium chloride infusion, , Intravenous, Continuous, Aljishi, Virgina Norfolk, MD, Last Rate: 10 mL/hr at 07/27/18 0800 .  amLODipine (NORVASC) tablet 10 mg, 10 mg, Per Tube, Daily, Aljishi, Virgina Norfolk, MD, 10 mg at 07/26/18 1031 .  chlorhexidine (PERIDEX) 0.12 % solution 15 mL, 15 mL, Mouth Rinse, BID, Agarwala, Ravi, MD, 15 mL at 07/27/18 0800 .  Chlorhexidine Gluconate Cloth 2 % PADS 6 each, 6 each, Topical, Daily, Agarwala, Ravi, MD .  dexamethasone (DECADRON) injection 1 mg, 1 mg, Intravenous, Q8H, Agarwala, Ravi, MD, 1 mg at 07/27/18 0513 .  dicyclomine (BENTYL) 10 MG/5ML syrup 10 mg, 10 mg, Per Tube,  BID, Aldean Jewett, MD, 10 mg at 07/26/18 1027 .  enoxaparin (LOVENOX) injection 40 mg, 40 mg, Subcutaneous, Q24H, Salvadore Dom E, NP, 40 mg at 07/26/18 1040 .  erythromycin ophthalmic ointment 1 application, 1 application, Both Eyes, Daily PRN, Judith Part, MD .  famotidine (PEPCID) IVPB 20 mg premix, 20 mg, Intravenous, Q12H, Aldean Jewett, MD, Stopped at 07/26/18 2156 .  feeding supplement (PRO-STAT SUGAR FREE 64) liquid 30 mL, 30 mL, Per Tube, BID, Salvadore Dom E, NP, 30 mL at 07/26/18 1029 .  feeding supplement (VITAL HIGH PROTEIN) liquid 1,000 mL, 1,000 mL, Per Tube, Q24H, Kipp Brood, MD, Stopped at 07/26/18 1100 .  fentaNYL (SUBLIMAZE) bolus via infusion 25 mcg, 25 mcg, Intravenous, Q1H PRN, Omar Person, NP, 25 mcg at 07/24/18 0174 .  fentaNYL 2538mcg in NS 244mL (22mcg/ml) infusion-PREMIX, 25-400 mcg/hr, Intravenous, Continuous, Eubanks, Katalina M, NP, Stopped at 07/24/18 1000 .  furosemide (LASIX) injection 40 mg, 40 mg, Intravenous, Daily, Kara Mead V, MD, 40 mg at 07/26/18 1026 .  hydrALAZINE (APRESOLINE) injection 10-20 mg, 10-20 mg, Intravenous, Q6H PRN, Rigoberto Noel, MD, 20 mg at 07/27/18 0513 .  hydrALAZINE (APRESOLINE) tablet 25 mg, 25 mg, Per  Tube, Q8H, Rigoberto Noel, MD, 25 mg at 07/26/18 0636 .  insulin aspart (novoLOG) injection 3-9 Units, 3-9 Units, Subcutaneous, Q4H, Minor, Grace Bushy, NP, 3 Units at 07/27/18 0816 .  insulin detemir (LEVEMIR) injection 20 Units, 20 Units, Subcutaneous, Q12H, Minor, Grace Bushy, NP, 20 Units at 07/26/18 2125 .  labetalol (NORMODYNE,TRANDATE) injection 10 mg, 10 mg, Intravenous, Q2H PRN, Aldean Jewett, MD, 10 mg at 07/27/18 0813 .  lactated ringers infusion, , Intravenous, Continuous, Belinda Block, MD, Last Rate: 10 mL/hr at 07/20/18 1500 .  MEDLINE mouth rinse, 15 mL, Mouth Rinse, q12n4p, Agarwala, Ravi, MD, 15 mL at 07/26/18 1540 .  morphine 2 MG/ML injection 1-2 mg, 1-2 mg, Intravenous, Q1H PRN, Flora Lipps, MD, 2 mg at 07/27/18 0215 .  polyethylene glycol (MIRALAX / GLYCOLAX) packet 17 g, 17 g, Oral, Daily, Judith Part, MD, 17 g at 07/24/18 2831 .  polyvinyl alcohol (LIQUIFILM TEARS) 1.4 % ophthalmic solution 1 drop, 1 drop, Both Eyes, TID, Aljishi, Virgina Norfolk, MD, 1 drop at 07/26/18 2125 .  prednisoLONE acetate (PRED FORTE) 1 % ophthalmic suspension 2 drop, 2 drop, Both Eyes, TID AC & HS, Saksham Akkerman, Joyice Faster, MD, 2 drop at 07/27/18 0817 .  primidone (MYSOLINE) tablet 50 mg, 50 mg, Per Tube, BID, Erick Colace, NP, 50 mg at 07/26/18 1025 .  propranolol (INDERAL) tablet 40 mg, 40 mg, Oral, BID, Rigoberto Noel, MD, 40 mg at 07/26/18 1025 .  rosuvastatin (CRESTOR) tablet 20 mg, 20 mg, Per Tube, Once per day on Tue Thu, Babcock, Peter E, NP, 20 mg at 07/26/18 1039 .  senna-docusate (Senokot-S) tablet 2 tablet, 2 tablet, Oral, BID, Judith Part, MD, 2 tablet at 07/24/18 2105 .  sodium chloride flush (NS) 0.9 % injection 10-40 mL, 10-40 mL, Intracatheter, Q12H, Agarwala, Ravi, MD .  sodium chloride flush (NS) 0.9 % injection 10-40 mL, 10-40 mL, Intracatheter, PRN, Kipp Brood, MD, 20 mL at 07/27/18 5176   Physical Exam: Awake/alert, confused but interactive and answering questions, voice raspy but otherwise normal, PERRL, gaze conjugate, FCx4 reliably to command without preference Incision c/d/i, neck soft, no evidence of hematoma  Assessment & Plan: 77 y.o. man s/p 4 level ACDF, recovering well immediately post-op with resolution of radicular pain and weakness, 9/19 code for asystole of unclear cause, CT C-sp no hematoma, CTH neg. MRI C-spine shows no evidence of epidural hematoma, minimal soft tissue swelling, no hematoma, good canal decompression. 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 9/26 extubated -no change in neurosurgical plan of care, okay with discontinuing steroids, defer taper timing to primary team -please call  with concerns or questions  Judith Part  07/27/18 9:14 AM

## 2018-07-28 ENCOUNTER — Inpatient Hospital Stay (HOSPITAL_COMMUNITY): Payer: Medicare Other

## 2018-07-28 DIAGNOSIS — L8990 Pressure ulcer of unspecified site, unspecified stage: Secondary | ICD-10-CM

## 2018-07-28 LAB — GLUCOSE, CAPILLARY
GLUCOSE-CAPILLARY: 174 mg/dL — AB (ref 70–99)
GLUCOSE-CAPILLARY: 206 mg/dL — AB (ref 70–99)
GLUCOSE-CAPILLARY: 69 mg/dL — AB (ref 70–99)
GLUCOSE-CAPILLARY: 81 mg/dL (ref 70–99)
Glucose-Capillary: 136 mg/dL — ABNORMAL HIGH (ref 70–99)
Glucose-Capillary: 110 mg/dL — ABNORMAL HIGH (ref 70–99)
Glucose-Capillary: 176 mg/dL — ABNORMAL HIGH (ref 70–99)

## 2018-07-28 LAB — CBC
HEMATOCRIT: 42.9 % (ref 39.0–52.0)
Hemoglobin: 13.4 g/dL (ref 13.0–17.0)
MCH: 28.3 pg (ref 26.0–34.0)
MCHC: 31.2 g/dL (ref 30.0–36.0)
MCV: 90.5 fL (ref 78.0–100.0)
Platelets: 272 10*3/uL (ref 150–400)
RBC: 4.74 MIL/uL (ref 4.22–5.81)
RDW: 14.1 % (ref 11.5–15.5)
WBC: 14.6 10*3/uL — AB (ref 4.0–10.5)

## 2018-07-28 LAB — COMPREHENSIVE METABOLIC PANEL
ALBUMIN: 2.8 g/dL — AB (ref 3.5–5.0)
ALT: 143 U/L — ABNORMAL HIGH (ref 0–44)
AST: 62 U/L — AB (ref 15–41)
Alkaline Phosphatase: 117 U/L (ref 38–126)
Anion gap: 9 (ref 5–15)
BILIRUBIN TOTAL: 0.8 mg/dL (ref 0.3–1.2)
BUN: 37 mg/dL — AB (ref 8–23)
CHLORIDE: 112 mmol/L — AB (ref 98–111)
CO2: 29 mmol/L (ref 22–32)
Calcium: 8.8 mg/dL — ABNORMAL LOW (ref 8.9–10.3)
Creatinine, Ser: 1.33 mg/dL — ABNORMAL HIGH (ref 0.61–1.24)
GFR calc Af Amer: 58 mL/min — ABNORMAL LOW (ref >60)
GFR calc non Af Amer: 50 mL/min — ABNORMAL LOW (ref >60)
GLUCOSE: 82 mg/dL (ref 70–99)
POTASSIUM: 3.9 mmol/L (ref 3.5–5.1)
Sodium: 150 mmol/L — ABNORMAL HIGH (ref 135–145)
Total Protein: 6.7 g/dL (ref 6.5–8.1)

## 2018-07-28 LAB — BRAIN NATRIURETIC PEPTIDE: B Natriuretic Peptide: 48.3 pg/mL (ref 0.0–100.0)

## 2018-07-28 MED ORDER — DEXTROSE 50 % IV SOLN
INTRAVENOUS | Status: AC
  Administered 2018-07-28: 50 mL
  Filled 2018-07-28: qty 50

## 2018-07-28 MED ORDER — INSULIN DETEMIR 100 UNIT/ML ~~LOC~~ SOLN
20.0000 [IU] | Freq: Two times a day (BID) | SUBCUTANEOUS | Status: DC
  Administered 2018-07-28 – 2018-07-30 (×4): 20 [IU] via SUBCUTANEOUS
  Filled 2018-07-28 (×3): qty 0.2

## 2018-07-28 MED ORDER — HYDRALAZINE HCL 25 MG PO TABS
25.0000 mg | ORAL_TABLET | Freq: Three times a day (TID) | ORAL | Status: DC
  Administered 2018-07-28 (×2): 25 mg
  Filled 2018-07-28 (×2): qty 1

## 2018-07-28 MED ORDER — FAMOTIDINE 40 MG/5ML PO SUSR
20.0000 mg | Freq: Every day | ORAL | Status: DC
  Administered 2018-07-28 – 2018-08-07 (×11): 20 mg
  Filled 2018-07-28 (×12): qty 2.5

## 2018-07-28 MED ORDER — DEXAMETHASONE SODIUM PHOSPHATE 4 MG/ML IJ SOLN
1.0000 mg | Freq: Two times a day (BID) | INTRAMUSCULAR | Status: DC
  Administered 2018-07-28 – 2018-07-29 (×3): 1 mg via INTRAVENOUS
  Filled 2018-07-28 (×3): qty 1

## 2018-07-28 MED ORDER — LABETALOL HCL 5 MG/ML IV SOLN: 10.0000 mg | INTRAVENOUS | Status: DC | PRN

## 2018-07-28 MED ORDER — ORAL CARE MOUTH RINSE: 15.0000 mL | Freq: Two times a day (BID) | OROMUCOSAL | Status: DC

## 2018-07-28 MED ORDER — INSULIN ASPART 100 UNIT/ML ~~LOC~~ SOLN
0.0000 [IU] | SUBCUTANEOUS | Status: DC
  Administered 2018-07-28: 7 [IU] via SUBCUTANEOUS
  Administered 2018-07-29: 3 [IU] via SUBCUTANEOUS
  Administered 2018-07-29: 7 [IU] via SUBCUTANEOUS
  Administered 2018-07-29: 4 [IU] via SUBCUTANEOUS
  Administered 2018-07-29: 7 [IU] via SUBCUTANEOUS
  Administered 2018-07-29 – 2018-07-30 (×4): 11 [IU] via SUBCUTANEOUS
  Administered 2018-07-30: 4 [IU] via SUBCUTANEOUS

## 2018-07-28 MED ORDER — CHLORHEXIDINE GLUCONATE 0.12 % MT SOLN
15.0000 mL | Freq: Two times a day (BID) | OROMUCOSAL | Status: DC
  Administered 2018-07-28: 15 mL via OROMUCOSAL

## 2018-07-28 NOTE — Progress Notes (Signed)
77 year old obese diabetic underwent ACDF on 9/16 and developed asystolic cardiac arrest 7/50, underwent therapeutic hypothermia, rewarmed on 5/18 Course complicated by severe hypertension and agitation requiring Precedex. He was successfully extubated 9/26 and has done well from the breathing standpoint. MRI of C-spine did not show any evidence of hematoma, brain did not show any evidence of anoxic encephalopathy and thoracic spine did not show any reason for leg weakness.  On exam-awake alert, follows commands although appears incredibly weak and deconditioned, 1+ bipedal edema, obese man, decreased breath sounds bilateral, S1-S2 distant  Chest x-ray reviewed which shows mild left lower lobe atelectasis otherwise clear with cardiomegaly. Labs show mild hypernatremia and mild increase in creatinine with improving leukocytosis.  Problems addressed on rounds today Acute respiratory failure-resolved Aspiration pneumonia -much improved. Dysphagia-NG tube to be reinserted through the tube feeds can be resumed over the weekend and then repeat swallow evaluation on Monday Acute metabolic encephalopathy-resolved Severe deconditioning-needs ongoing PT and rehab  Cardiac arrest-no clear cause identified but presumably hypoxia mediated, history of CAD and CABG in 2019 Severe hypertension-continue amlodipine, propranolol and resume hydralazine oral S/p ACDF-dexamethasone can be tapered off per neurosurgery.  CKD 3-hold Lasix due to rising sodium and creatinine and recheck  Can transfer to stepdown unit and to triad 9/29  Kara Mead MD. [facility]. [place] [facility] Pager [phone] If no response call [phone]   07/28/2018

## 2018-07-28 NOTE — Progress Notes (Signed)
Cortrak team paged for new cortrak tube.    Dietician is coming to place a new cortrak in the next 20 minutes.

## 2018-07-28 NOTE — Progress Notes (Signed)
Cortrak Tube Team Note:  Consult received to Replace a Cortrak feeding tube. Patient had dislodged earlier this morning.   A 10 F Cortrak tube was placed in the R nare and secured with a nasal bridle at 105 cm. Per the Cortrak monitor reading the tube tip is in mid duodenum, approximately d2-d3.   No x-ray is required. RN may begin using tube.   If the tube becomes dislodged please keep the tube and contact the Cortrak team at [email or web address] (password [email or web address]) for replacement.  If after hours and replacement cannot be delayed, place a NG tube and confirm placement with an abdominal x-ray.   Burtis Junes RD, LDN, CNSC Clinical Nutrition Available Tues-Sat via Pager: [phone] 07/28/2018 11:58 AM

## 2018-07-28 NOTE — Progress Notes (Signed)
Neurosurgery Progress Note  No issues overnight.  EXAM:  BP (!) 157/57 (BP Location: Left Arm)   Pulse 96   Temp 99.1 F (37.3 C) (Axillary)   Resp 16   Ht 5\' 8"  (1.727 m)   Wt 118.2 kg   SpO2 96%   BMI 39.62 kg/m   Awake but confused Per nursing was following commands earlier but will not currently Does MAE spontaneously  Incision bandage with dried blood. No active drainage. Otherwise, soft, no palpable hematoma  PLAN No significant change. Remains encephalopathic.  Continue current care. No new NSY recs

## 2018-07-29 ENCOUNTER — Inpatient Hospital Stay (HOSPITAL_COMMUNITY): Payer: Medicare Other

## 2018-07-29 ENCOUNTER — Other Ambulatory Visit: Payer: Self-pay

## 2018-07-29 DIAGNOSIS — Z978 Presence of other specified devices: Secondary | ICD-10-CM

## 2018-07-29 DIAGNOSIS — J9600 Acute respiratory failure, unspecified whether with hypoxia or hypercapnia: Secondary | ICD-10-CM

## 2018-07-29 DIAGNOSIS — J9602 Acute respiratory failure with hypercapnia: Secondary | ICD-10-CM

## 2018-07-29 LAB — POCT I-STAT 3, ART BLOOD GAS (G3+)
Acid-Base Excess: 8 mmol/L — ABNORMAL HIGH (ref 0.0–2.0)
BICARBONATE: 32.9 mmol/L — AB (ref 20.0–28.0)
O2 SAT: 100 %
PO2 ART: 188 mmHg — AB (ref 83.0–108.0)
Patient temperature: 98.6
TCO2: 34 mmol/L — ABNORMAL HIGH (ref 22–32)
pCO2 arterial: 48.9 mmHg — ABNORMAL HIGH (ref 32.0–48.0)
pH, Arterial: 7.436 (ref 7.350–7.450)

## 2018-07-29 LAB — GLUCOSE, CAPILLARY
GLUCOSE-CAPILLARY: 277 mg/dL — AB (ref 70–99)
GLUCOSE-CAPILLARY: 202 mg/dL — AB (ref 70–99)
GLUCOSE-CAPILLARY: 183 mg/dL — AB (ref 70–99)
Glucose-Capillary: 286 mg/dL — ABNORMAL HIGH (ref 70–99)
Glucose-Capillary: 297 mg/dL — ABNORMAL HIGH (ref 70–99)
Glucose-Capillary: 230 mg/dL — ABNORMAL HIGH (ref 70–99)
Glucose-Capillary: 134 mg/dL — ABNORMAL HIGH (ref 70–99)

## 2018-07-29 LAB — TROPONIN I
Troponin I: 0.07 ng/mL (ref <0.03)
Troponin I: 0.09 ng/mL (ref <0.03)
Troponin I: 0.07 ng/mL (ref <0.03)

## 2018-07-29 LAB — BASIC METABOLIC PANEL
ANION GAP: 13 (ref 5–15)
BUN: 42 mg/dL — ABNORMAL HIGH (ref 8–23)
CO2: 25 mmol/L (ref 22–32)
Calcium: 9 mg/dL (ref 8.9–10.3)
Chloride: 112 mmol/L — ABNORMAL HIGH (ref 98–111)
Creatinine, Ser: 1.48 mg/dL — ABNORMAL HIGH (ref 0.61–1.24)
GFR calc Af Amer: 51 mL/min — ABNORMAL LOW (ref >60)
GFR calc non Af Amer: 44 mL/min — ABNORMAL LOW (ref >60)
GLUCOSE: 260 mg/dL — AB (ref 70–99)
POTASSIUM: 5.1 mmol/L (ref 3.5–5.1)
Sodium: 150 mmol/L — ABNORMAL HIGH (ref 135–145)

## 2018-07-29 LAB — CBC
HCT: 40.2 % (ref 39.0–52.0)
HEMOGLOBIN: 12.9 g/dL — AB (ref 13.0–17.0)
MCH: 29.1 pg (ref 26.0–34.0)
MCHC: 32.1 g/dL (ref 30.0–36.0)
MCV: 90.7 fL (ref 78.0–100.0)
Platelets: 289 10*3/uL (ref 150–400)
RBC: 4.43 MIL/uL (ref 4.22–5.81)
RDW: 14.1 % (ref 11.5–15.5)
WBC: 11 10*3/uL — ABNORMAL HIGH (ref 4.0–10.5)

## 2018-07-29 LAB — COMPREHENSIVE METABOLIC PANEL
ALK PHOS: 128 U/L — AB (ref 38–126)
ALT: 528 U/L — ABNORMAL HIGH (ref 0–44)
ANION GAP: 11 (ref 5–15)
AST: 549 U/L — ABNORMAL HIGH (ref 15–41)
Albumin: 2.3 g/dL — ABNORMAL LOW (ref 3.5–5.0)
BILIRUBIN TOTAL: 0.6 mg/dL (ref 0.3–1.2)
BUN: 45 mg/dL — ABNORMAL HIGH (ref 8–23)
CALCIUM: 8.5 mg/dL — AB (ref 8.9–10.3)
CO2: 28 mmol/L (ref 22–32)
Chloride: 112 mmol/L — ABNORMAL HIGH (ref 98–111)
Creatinine, Ser: 1.44 mg/dL — ABNORMAL HIGH (ref 0.61–1.24)
GFR, EST AFRICAN AMERICAN: 53 mL/min — AB (ref >60)
GFR, EST NON AFRICAN AMERICAN: 46 mL/min — AB (ref >60)
Glucose, Bld: 296 mg/dL — ABNORMAL HIGH (ref 70–99)
Potassium: 4.1 mmol/L (ref 3.5–5.1)
Sodium: 151 mmol/L — ABNORMAL HIGH (ref 135–145)
TOTAL PROTEIN: 5.4 g/dL — AB (ref 6.5–8.1)

## 2018-07-29 LAB — PHOSPHORUS: Phosphorus: 7 mg/dL — ABNORMAL HIGH (ref 2.5–4.6)

## 2018-07-29 LAB — CULTURE, BLOOD (ROUTINE X 2)
CULTURE: NO GROWTH
CULTURE: NO GROWTH

## 2018-07-29 LAB — LACTIC ACID, PLASMA
LACTIC ACID, VENOUS: 3.8 mmol/L — AB (ref 0.5–1.9)
Lactic Acid, Venous: 1.3 mmol/L (ref 0.5–1.9)

## 2018-07-29 LAB — MAGNESIUM: Magnesium: 2.5 mg/dL — ABNORMAL HIGH (ref 1.7–2.4)

## 2018-07-29 MED ORDER — FENTANYL BOLUS VIA INFUSION
25.0000 ug | INTRAVENOUS | Status: DC | PRN
  Administered 2018-07-29: 25 ug via INTRAVENOUS
  Filled 2018-07-29: qty 25

## 2018-07-29 MED ORDER — VALPROATE SODIUM 500 MG/5ML IV SOLN
1500.0000 mg | Freq: Once | INTRAVENOUS | Status: AC
  Administered 2018-07-29: 1500 mg via INTRAVENOUS
  Filled 2018-07-29: qty 15

## 2018-07-29 MED ORDER — FREE WATER
100.0000 mL | Freq: Four times a day (QID) | Status: DC
  Administered 2018-07-29 – 2018-07-30 (×6): 100 mL

## 2018-07-29 MED ORDER — NOREPINEPHRINE 4 MG/250ML-% IV SOLN
0.0000 ug/min | INTRAVENOUS | Status: DC
  Administered 2018-07-29: 2 ug/min via INTRAVENOUS
  Filled 2018-07-29: qty 250

## 2018-07-29 MED ORDER — FENTANYL CITRATE (PF) 100 MCG/2ML IJ SOLN: 50.0000 ug | INTRAMUSCULAR | Status: DC | PRN

## 2018-07-29 MED ORDER — HYDRALAZINE HCL 25 MG PO TABS: 25.0000 mg | ORAL_TABLET | Freq: Three times a day (TID) | ORAL | Status: DC

## 2018-07-29 MED ORDER — FENTANYL CITRATE (PF) 100 MCG/2ML IJ SOLN
50.0000 ug | Freq: Once | INTRAMUSCULAR | Status: AC
  Administered 2018-07-29: 50 ug via INTRAVENOUS

## 2018-07-29 MED ORDER — ORAL CARE MOUTH RINSE
15.0000 mL | OROMUCOSAL | Status: DC
  Administered 2018-07-29 – 2018-08-02 (×37): 15 mL via OROMUCOSAL

## 2018-07-29 MED ORDER — FENTANYL CITRATE (PF) 100 MCG/2ML IJ SOLN
25.0000 ug | INTRAMUSCULAR | Status: DC | PRN
  Administered 2018-07-30 – 2018-08-07 (×23): 25 ug via INTRAVENOUS
  Filled 2018-07-29 (×26): qty 2

## 2018-07-29 MED ORDER — NOREPINEPHRINE 4 MG/250ML-% IV SOLN
0.0000 ug/min | INTRAVENOUS | Status: DC
  Administered 2018-07-29: 2 ug/min via INTRAVENOUS
  Filled 2018-07-29 (×2): qty 250

## 2018-07-29 MED ORDER — MIDAZOLAM HCL 2 MG/2ML IJ SOLN
1.0000 mg | INTRAMUSCULAR | Status: DC | PRN
  Administered 2018-07-29 – 2018-07-30 (×2): 1 mg via INTRAVENOUS
  Filled 2018-07-29 (×2): qty 2

## 2018-07-29 MED ORDER — MIDAZOLAM HCL 2 MG/2ML IJ SOLN
INTRAMUSCULAR | Status: AC
  Filled 2018-07-29: qty 2

## 2018-07-29 MED ORDER — FENTANYL 2500MCG IN NS 250ML (10MCG/ML) PREMIX INFUSION
25.0000 ug/h | INTRAVENOUS | Status: DC
  Administered 2018-07-29: 25 ug/h via INTRAVENOUS
  Filled 2018-07-29: qty 250

## 2018-07-29 MED ORDER — MIDAZOLAM HCL 2 MG/2ML IJ SOLN
1.0000 mg | INTRAMUSCULAR | Status: DC | PRN
  Administered 2018-07-29: 1 mg via INTRAVENOUS
  Filled 2018-07-29 (×2): qty 2

## 2018-07-29 MED ORDER — LORAZEPAM 2 MG/ML IJ SOLN
2.0000 mg | Freq: Once | INTRAMUSCULAR | Status: AC
  Administered 2018-07-29: 2 mg via INTRAVENOUS
  Filled 2018-07-29: qty 1

## 2018-07-29 MED ORDER — SODIUM CHLORIDE 0.9 % IV SOLN
INTRAVENOUS | Status: DC | PRN
  Administered 2018-08-03: 01:00:00 via INTRA_ARTERIAL

## 2018-07-29 MED ORDER — CHLORHEXIDINE GLUCONATE 0.12% ORAL RINSE (MEDLINE KIT)
15.0000 mL | Freq: Two times a day (BID) | OROMUCOSAL | Status: DC
  Administered 2018-07-29 – 2018-08-02 (×8): 15 mL via OROMUCOSAL

## 2018-07-29 MED ORDER — GLUCAGON HCL RDNA (DIAGNOSTIC) 1 MG IJ SOLR
1.0000 mg | Freq: Once | INTRAMUSCULAR | Status: AC
  Administered 2018-07-29: 1 mg via INTRAVENOUS
  Filled 2018-07-29: qty 1

## 2018-07-29 MED FILL — Medication: Qty: 1 | Status: AC

## 2018-07-29 NOTE — Procedures (Signed)
Arterial Catheter Insertion Procedure Note SHALIK SANFILIPPO [id number] [birth year]  Procedure: Insertion of Arterial Catheter  Indications: Blood pressure monitoring  Procedure Details Consent: Unable to obtain consent because of emergent . Time Out: Verified patient identification, verified procedure, site/side was marked, verified correct patient position, special equipment/implants available, medications/allergies/relevent history reviewed, required imaging and test results available.  Performed  Maximum sterile technique was used including antiseptics, cap, gloves, gown, hand hygiene, mask and sheet. Skin prep: Chlorhexidine; local anesthetic administered 20 gauge catheter was inserted into right radial artery using the Seldinger technique. ULTRASOUND GUIDANCE USED: YES Evaluation Blood flow good; BP tracing good. Complications: No apparent complications.   Romeo Apple 07/29/2018

## 2018-07-29 NOTE — Progress Notes (Signed)
Spoke with Dr.Alva regarding patients elevated SBP 190's-200's/60's. Dr.Alva said if his BP sustains for 4-6 hours in that range than to call elink and resume home medications. Will continue to monitor.

## 2018-07-29 NOTE — Progress Notes (Signed)
PCCM Progress Note   Called by bedside RN for order of Levophed given decrease in MAP. Patient Systolic 456-256. Will change BP goal for Systolic >38. Will titrate this based on A-line pressure.   Hayden Pedro, AGACNP-BC [place] [facility]  Pgr: [phone]  PCCM Pgr: [phone]

## 2018-07-29 NOTE — Progress Notes (Signed)
Bedside EEG complete; results pending. 

## 2018-07-29 NOTE — Progress Notes (Addendum)
CODE BLUE NOTE  Patient Name: Edward [name]   MRN: [id number]   Date of Birth/ Sex: 04-15-1941 , male      Admission Date: 07/13/2018  Attending Provider: Kipp Brood, MD  Primary Diagnosis: Cervical radiculopathy    Indication: Patient was noted to be agonal, patient was then bagged, however progressed to PEA at 2349. Given 3 EPI, 2 x Bicarb, Calcium. At 2355 noted to be in VF, shocked with 150J, given Lidocaine. ROSC at 2356.    Technical Description:  - CPR performance duration:  9 minutes  - Was defibrillation or cardioversion used? Yes   - Was external pacer placed? No  - Was patient intubated pre/post CPR? Post-ROSC    Medications Administered: Y = Yes; Blank = No Amiodarone    Atropine    Calcium    Epinephrine  X 3  Lidocaine  X 1  Magnesium    Norepinephrine    Phenylephrine    Sodium bicarbonate  X 2  Vasopressin      Post CPR evaluation:  - Final Status - Was patient successfully resuscitated ? Yes - What is current rhythm? NSR - What is current hemodynamic status? Stable    Miscellaneous Information:  - Labs sent, including: BMP, CBC, LA, MAG, PHOS, Aline    Multiple attempts to reach patient family with no response.     Hayden Pedro, AGACNP-BC [place] [facility]  Pgr: [phone]  PCCM Pgr: [phone]

## 2018-07-29 NOTE — Progress Notes (Signed)
Patient transported off unit to MRI

## 2018-07-29 NOTE — Progress Notes (Signed)
Patient returned to floor from MRI.

## 2018-07-29 NOTE — Progress Notes (Signed)
PCCM Interval Note  Patient with right upward gaze and noted posturing of upper extremities with rhythmic twitching of bilateral feet. Neurology consulted, recommend Ativan 2 mg, CT Head, EEG, and routine MRI.   Hayden Pedro, AGACNP-BC [facility]  Pgr: [phone]  PCCM Pgr: [phone]

## 2018-07-29 NOTE — Progress Notes (Signed)
Pt last two CBGS 286 & 277. CCM MD notified. MD orders to hold insulin gtt at this time, continue with q4 CBG checks. RN will continue to monitor.

## 2018-07-29 NOTE — Consult Note (Signed)
Cardiology Consultation:   Patient ID: Edward [name] MRN: [id number]; DOB: September 17, 1941  Admit date: 07/13/2018 Date of Consult: 07/29/2018  Primary Care Provider: Ann Held, DO Primary Cardiologist: Sinclair Grooms, MD  Primary Electrophysiologist:  None    Patient Profile:   Edward [name] is a 77 y.o. male with a hx of CAD sp CABG 1829, chronic diastolic dysfunction, DM, CRI, HTN and OSA sp recent neck surgery who is being seen today for the evaluation of cardiac arrest at the request of Dr Elsworth Soho.  History of Present Illness:   [name] underwent neck surgery recent and subsequently had cardiac arrest 07-19-18.  He had hypothermia protocol and was rewarmed initially without event.  This was presumed to be due to hypoxia.  He appeared to be making reasonable recovery.  At 12:08 this morning, he became agonal.  He was bagged and then progressed to PEA arrest.  He was given epi x 3 and then subsequently degenerated into VF for which he required defibrillation.  He had had no primary arrhythmias prior or since.  He remains intubated on the vent without purposeful movements currently.  Past Medical History:  Diagnosis Date  . Adenomatous colon polyp 04/2011  . CAD (coronary artery disease)    a. 01/2015 DES to LAD  b. 12/2015: [place] 85% oLCx lesion--> rx therapy.  . Chronic diastolic CHF (congestive heart failure) ([place])    a. 05/2017 Echo: EF 60-65%, Gr1 DD, no rwma, mildly dil LA, nl RV fxn, mild TR.  . CKD (chronic kidney disease), stage II    a. probable CKD II-III with baseline CR 1.1-1.3.  . DM type 2 (diabetes mellitus, type 2), insulin dependent 01/29/2014   fasting cbg 50-120 with new regimen  . Dyslipidemia, goal LDL below 70 01/29/2014  . Enlarged prostate   . Essential tremor    a. on proprnolol  . GERD (gastroesophageal reflux disease)   . Hx of CABG   . Hypertension   . Internal hemorrhoid   . Sleep apnea    does not use cpap (06/04/2015)  . TIA (transient ischemic  attack) 2002    Past Surgical History:  Procedure Laterality Date  . ANTERIOR CERVICAL DECOMPRESSION/DISCECTOMY FUSION 4 LEVELS N/A 07/16/2018   Procedure: CERVICAL THREE-FOUR CERVICAL FOUR-FIVE CERVICAL FIVE-SIX CERVICAL SIX-SEVEN ANTERIOR CERVICAL DECOMPRESSION/DISCECTOMY/FUSION;  Surgeon: Judith Part, MD;  Location: [facility];  Service: Neurosurgery;  Laterality: N/A;  CERVICAL THREE-FOUR CERVICAL FOUR-FIVE CERVICAL FIVE-SIX CERVICAL SIX-SEVEN ANTERIOR CERVICAL DECOMPRESSION/DISCECTOMY/FUSION  . BACK SURGERY    . CARDIAC CATHETERIZATION  01/28/14   + CAD treat medically  . CARDIAC CATHETERIZATION N/A 12/21/2015   Procedure: Right/Left Heart Cath and Coronary Angiography;  Surgeon: Belva Crome, MD;  Location: [place];  Service: Cardiovascular;  Laterality: N/A;  . CARDIAC CATHETERIZATION N/A 11/02/2016   Procedure: Left Heart Cath and Coronary Angiography;  Surgeon: Nelva Bush, MD;  Location: [place] [facility];  Service: Cardiovascular;  Laterality: N/A;  . CATARACT EXTRACTION Bilateral   . CATARACT EXTRACTION, BILATERAL Bilateral   . CORONARY ANGIOPLASTY WITH STENT PLACEMENT  01/30/2015   DES [place]  [place] to LAD by Dr Tamala Julian  . CORONARY ARTERY BYPASS GRAFT N/A 11/09/2016   Procedure: CORONARY ARTERY BYPASS GRAFTING (CABG) x 2;  Surgeon: Melrose Nakayama, MD;  Location: [facility];  Service: Open Heart Surgery;  Laterality: N/A;  . ENDOVEIN HARVEST OF GREATER SAPHENOUS VEIN Left 11/09/2016   Procedure: ENDOVEIN HARVEST OF GREATER SAPHENOUS VEIN;  Surgeon: Melrose Nakayama, MD;  Location: [facility];  Service: Open Heart Surgery;  Laterality: Left;  . JOINT REPLACEMENT    . LEFT HEART CATH AND CORS/GRAFTS ANGIOGRAPHY N/A 03/09/2018   Procedure: LEFT HEART CATH AND CORS/GRAFTS ANGIOGRAPHY;  Surgeon: Belva Crome, MD;  Location: [place] [facility];  Service: Cardiovascular;  Laterality: N/A;  . LEFT HEART CATHETERIZATION WITH CORONARY ANGIOGRAM N/A 01/28/2014   Procedure:  LEFT HEART CATHETERIZATION WITH CORONARY ANGIOGRAM;  Surgeon: Sinclair Grooms, MD;  Location[facility] C[facility];  Service: Cardiovascular;  Laterality: N/A;  . LEFT HEART CATHETERIZATION WITH CORONARY ANGIOGRAM N/A 01/30/2015   Procedure: LEFT HEART CATHETERIZATION WITH CORONARY ANGIOGRAM;  Surgeon: Belva Crome, MD;  Location: [facility] CATH LAB;  Service: Cardiovascular;  Laterality: N/A;  . LUMBAR [place]    . SHOULDER ARTHROSCOPY Right 07/30/2014   Procedure: Right Shoulder Arthroscopy, Debridement, Decompression, Manipulation Under Anesthesia;  Surgeon: Newt Minion, MD;  Location: [place];  Service: Orthopedics;  Laterality: Right;  . TEE WITHOUT CARDIOVERSION N/A 11/09/2016   Procedure: TRANSESOPHAGEAL ECHOCARDIOGRAM (TEE);  Surgeon: Melrose Nakayama, MD;  Location: [place];  Service: Open Heart Surgery;  Laterality: N/A;  . TOTAL KNEE ARTHROPLASTY Bilateral      Inpatient Medications: Scheduled Meds: . dexamethasone  1 mg Intravenous Q12H  . dicyclomine  10 mg Per Tube BID  . enoxaparin (LOVENOX) injection  40 mg Subcutaneous Q24H  . famotidine  20 mg Per Tube Daily  . feeding supplement (PRO-STAT SUGAR FREE 64)  30 mL Per Tube Daily  . free water  100 mL Per Tube Q6H  . insulin aspart  0-20 Units Subcutaneous Q4H  . insulin detemir  20 Units Subcutaneous Q12H  . polyvinyl alcohol  1 drop Both Eyes TID  . prednisoLONE acetate  2 drop Both Eyes TID AC & HS  . primidone  50 mg Per Tube BID  . rosuvastatin  20 mg Per Tube Once per day on Tue Thu   Continuous Infusions: . sodium chloride    . feeding supplement (VITAL AF 1.2 CAL) 1,000 mL (07/29/18 0320)  . fentaNYL infusion INTRAVENOUS Stopped (07/29/18 0947)  . lactated ringers 10 mL/hr at 07/29/18 1026  . norepinephrine (LEVOPHED) Adult infusion     PRN Meds: Place/Maintain arterial line **AND** sodium chloride, erythromycin, fentaNYL, midazolam, midazolam  Allergies:    Allergies  Allergen Reactions  . Pneumococcal Vaccines  Anaphylaxis    Social History:   Social History   Socioeconomic History  . Marital status: Married    Spouse name: Not on file  . Number of children: 0  . Years of education: Not on file  . Highest education level: Not on file  Occupational History  . Occupation: retired  Scientific laboratory technician  . Financial resource strain: Not on file  . Food insecurity:    Worry: Not on file    Inability: Not on file  . Transportation needs:    Medical: Not on file    Non-medical: Not on file  Tobacco Use  . Smoking status: Never Smoker  . Smokeless tobacco: Never Used  Substance and Sexual Activity  . Alcohol use: No    Alcohol/week: 0.0 standard drinks  . Drug use: No  . Sexual activity: Not Currently  Lifestyle  . Physical activity:    Days per week: Not on file    Minutes per session: Not on file  . Stress: Not on file  Relationships  . Social connections:  Talks on phone: Not on file    Gets together: Not on file    Attends religious service: Not on file    Active member of club or organization: Not on file    Attends meetings of clubs or organizations: Not on file    Relationship status: Not on file  . Intimate partner violence:    Fear of current or ex partner: Not on file    Emotionally abused: Not on file    Physically abused: Not on file    Forced sexual activity: Not on file  Other Topics Concern  . Not on file  Social History Narrative   He is a Geophysicist/field seismologist by trade.   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   He lives alone.  His wife is living in [place] at this time.  They do not have children.   Highest level of education:  College graduate    Family History:    Family History  Problem Relation Age of Onset  . CAD Brother        2 brothers - CABG  . Alzheimer's disease Sister   . CAD Sister   . Prostate cancer Brother   . Asthma Brother        2 brothers   . Diabetes Other        entire family  . Colon cancer Neg Hx      ROS:    Pt unable to provide  Physical Exam/Data:   Vitals:   07/29/18 0824 07/29/18 0900 07/29/18 1000 07/29/18 1230  BP: (!) 119/48 124/65 (!) 76/64   Pulse: 66 64 65 63  Resp: 20 18 16 18   Temp:      TempSrc:      SpO2: 99% 99% 100% 96%  Weight:      Height:        Intake/Output Summary (Last 24 hours) at 07/29/2018 1351 Last data filed at 07/29/2018 0800 Gross per 24 hour  Intake 948.26 ml  Output 2195 ml  Net -1246.74 ml   Filed Weights   07/27/18 0408 07/28/18 0500 07/29/18 0400  Weight: 123.2 kg 118.2 kg 112 kg   Body mass index is 37.54 kg/m.  General:  Ill appearing, unresponsive on vent HEENT: ETT in place Cardiac:  normal S1, S2; RRR; no murmur  Lungs:  clear to auscultation bilaterally, no wheezing, rhonchi or rales  Abd: soft, nontender, no hepatomegaly  Ext: no edema Musculoskeletal:  No deformities, BUE and BLE strength normal and equal Skin: warm and dry  Neuro:  CNs 2-12 intact, no focal abnormalities noted Psych:  Normal affect   EKG:  The EKG was personally reviewed and demonstrates:  Sinus rhythm 60 bpm, PR 204 msec, QTc 491 msec Telemetry:  Telemetry was personally reviewed and demonstrates:  Sinus rhythm,  During PEA event, he is noted to have sinus bradycardia and mobitz I second degree AV block..  Nonsustained polymorphic VT is noted during this time and is "short long short" coupled.  He then has resuscitated artifact followed by eventual VF for which he was defibrillated.   Laboratory Data:  Chemistry Recent Labs  Lab 07/28/18 0250 07/29/18 0033 07/29/18 0330  NA 150* 150* 151*  K 3.9 5.1 4.1  CL 112* 112* 112*  CO2 29 25 28   GLUCOSE 82 260* 296*  BUN 37* 42* 45*  CREATININE 1.33* 1.48* 1.44*  CALCIUM 8.8* 9.0 8.5*  GFRNONAA 50* 44* 46*  GFRAA 58* 51* 53*  ANIONGAP 9 13 11     Recent Labs  Lab 07/28/18 0250 07/29/18 0330  PROT 6.7 5.4*  ALBUMIN 2.8* 2.3*  AST 62* 549*  ALT 143* 528*  ALKPHOS 117 128*  BILITOT 0.8 0.6    Hematology Recent Labs  Lab 07/25/18 0339 07/28/18 0250 07/29/18 0033  WBC 20.9* 14.6* 11.0*  RBC 4.41 4.74 4.43  HGB 12.5* 13.4 12.9*  HCT 39.5 42.9 40.2  MCV 89.6 90.5 90.7  MCH 28.3 28.3 29.1  MCHC 31.6 31.2 32.1  RDW 13.9 14.1 14.1  PLT 242 272 289   Cardiac Enzymes Recent Labs  Lab 07/29/18 0033 07/29/18 0330  TROPONINI 0.07* 0.09*   No results for input(s): TROPIPOC in the last 168 hours.  BNP Recent Labs  Lab 07/28/18 0250  BNP 48.3    DDimer No results for input(s): DDIMER in the last 168 hours.  Radiology/Studies:  Ct Head Wo Contrast  Result Date: 07/29/2018 CLINICAL DATA:  Altered level of consciousness. History of cardiac arrest July 19, 2018 resulting in hypoxic ischemic injury. Cardiac arrest July 28, 2018. EXAM: CT HEAD WITHOUT CONTRAST TECHNIQUE: Contiguous axial images were obtained from the base of the skull through the vertex without intravenous contrast. COMPARISON:  MRI of the head July 23, 2018 and CT HEAD July 19, 2018 FINDINGS: BRAIN: No intraparenchymal hemorrhage, mass effect nor midline shift. The ventricles and sulci are normal for age. Mild generally decreased gray-white matter differentiation. No acute large vascular territory infarcts. No abnormal extra-axial fluid collections. Basal cisterns are patent. VASCULAR: Mild calcific atherosclerosis of the carotid siphons. SKULL: No skull fracture. Old nasal bone fractures. No significant scalp soft tissue swelling. SINUSES/ORBITS: Trace paranasal sinus mucosal thickening. Mastoid air cells are well aerated.The included ocular globes and orbital contents are non-suspicious. Status post bilateral ocular lens implants. OTHER: Life-support lines in place. IMPRESSION: Early changes of hypoxic ischemic injury. These results will be called to the ordering clinician or representative by the Radiologist Assistant, and communication documented in the PACS or zVision Dashboard. Electronically  Signed   By: Elon Alas M.D.   On: 07/29/2018 03:15   Dg Chest Port 1 View  Result Date: 07/29/2018 CLINICAL DATA:  Post intubation EXAM: PORTABLE CHEST 1 VIEW COMPARISON:  07/28/2018 FINDINGS: Endotracheal tube terminates 3 cm above the carina. Enteric tube courses below the diaphragm. Low lung volumes. Left basilar atelectasis. No frank interstitial edema. No pleural effusion or pneumothorax. The heart is normal in size. Defibrillator pads overlying the left hemithorax. Cervical spine fixation hardware. IMPRESSION: Endotracheal tube terminates 3 cm above the carina. Enteric tube courses below the diaphragm. Electronically Signed   By: Julian Hy M.D.   On: 07/29/2018 00:53   Dg Chest Port 1 View  Result Date: 07/28/2018 CLINICAL DATA:  Respiratory failure EXAM: PORTABLE CHEST 1 VIEW COMPARISON:  July 26, 2018 FINDINGS: Nasogastric tube and central catheter have been removed. No pneumothorax. There is slight left base atelectasis. Lungs elsewhere are clear. There is cardiomegaly with pulmonary vascularity normal. No adenopathy. Patient is status post coronary artery bypass grafting. No bone lesions. IMPRESSION: No pneumothorax. Mild left base atelectasis. Lungs elsewhere clear. There is stable cardiomegaly. Electronically Signed   By: Lowella Grip III M.D.   On: 07/28/2018 08:34   Dg Chest Port 1 View  Result Date: 07/26/2018 CLINICAL DATA:  Check endotracheal tube placement, respiratory failure EXAM: PORTABLE CHEST 1 VIEW COMPARISON:  07/25/2018 FINDINGS: Cardiac shadow is mildly prominent but accentuated by the portable technique. Endotracheal tube, nasogastric  catheter and left jugular central line are again seen and stable. No pneumothorax is noted. Bibasilar atelectatic changes are again seen and stable. No new focal abnormality is noted. IMPRESSION: Stable appearance of the chest from the previous exam Electronically Signed   By: Inez Catalina M.D.   On: 07/26/2018 09:30     Assessment and Plan:   1. PEA arrest Etiology is not clear.  Appears to have begun as an agonal event.  Arrhythmias appear secondary after epi administration.  I do not think that this is a primary arrhythmia.  I would not advise antiarrhythmic medicines or ICD at this time. I would favor supportive care.  Hopefully, he will wake up and be able to be weaned from the vent.  Could consider repeat cath if he makes meaningful recovery, though cath from 5-19 is reviewed and plan for medical management is noted.  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  This may be secondary to hypoxia - recent events.  Avoid qt prolonging medications.  Keep K > 3.9, Mg > 1.9  3. CAD Known CAD sp CABG (cath 5-19 reviewed) Aggressive medical management and reassessment once he recovers from PEA arrest.   General cardiology to follow The patient is quite ill and at risk for decompensation or death. A high level of decision making was required for this encounter.  For questions or updates, please contact [place] Please consult [email or web address] for contact info under     Signed, Thompson Grayer, MD  07/29/2018 1:51 PM

## 2018-07-29 NOTE — Progress Notes (Addendum)
EEG preliminary review: Diffuse low voltage with EKG artifact. No myoclonic discharges, electrographic seizures or interictal spikes seen.   Sedating meds on his list are not felt to be sufficient to result in the low voltage seen on EEG. 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   A/R:  1. Would continue valproic acid at 5 mg/kg TID.  2. Not in status on EEG, which is diffuse low-voltage suggestive of diffuse anoxic brain injury 3. Awaiting repeat MRI.    Electronically signed: Dr. Kerney Elbe

## 2018-07-29 NOTE — Progress Notes (Addendum)
Neurology recall progress note  Reason for recall: Concern for worsening anoxic brain injury status post second cardiac arrest during this admission Referring Physician: Critical care-Dr. Theora Gianotti.Dewaine Oats, NP  CC: Altered mental status  History is obtained from: Chart review, primary team  HPI: Edward [name] is a 77 y.o. 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  He was scheduled for C-spine surgery that he had on September 16 of 2019 and on September 19 he was noted to have worsening dysphagia and suffered a cardiac arrest on the afternoon of 07/19/2018 when he was found pulseless and underwent CPR with return of spontaneous circulation after single dose of epinephrine with uncertain downtime.  He did undergo cooling protocol starting 10:30 PM on 07/19/2018 and EEG showed slow activity but no epinephrine features.  After his rewarming was completed on 921 and paralytics were off and sedation discontinued, he continued to be in several pathic and neurology was consulted at that time.  It was deemed after work-up that he had suffered hypoxic/anoxic brain injury and that was the cause of his encephalopathy as well as possible paraplegia due to bifrontal involvement.  Unfortunately, [name] suffered another cardiac arrest around 11:50 PM on 07/28/2018 and received 3 rounds of epi, 2 A of bicarb and calcium with downtime of approximately 15 minutes before return of spontaneous circulation.  He was intubated and neurology was consulted because of the concern for a right gaze preference, altered mental status after the CODE BLUE and intermittent rhythmic movements in bilateral lower extremities.  LKW: 11 PM on 07/28/2018 tpa given?: no, cardiac arrest nonfocal exam Premorbid modified Rankin scale (mRS): 5   ROS:  Unable to obtain due to altered mental  status.   Past Medical History:  Diagnosis Date  . Adenomatous colon polyp 04/2011  . CAD (coronary artery disease)    a. 01/2015 DES to LAD  b. 12/2015: [place] 85% oLCx lesion--> rx therapy.  . Chronic diastolic CHF (congestive heart failure) ([place])    a. 05/2017 Echo: EF 60-65%, Gr1 DD, no rwma, mildly dil LA, nl RV fxn, mild TR.  . CKD (chronic kidney disease), stage II    a. probable CKD II-III with baseline CR 1.1-1.3.  . DM type 2 (diabetes mellitus, type 2), insulin dependent 01/29/2014   fasting cbg 50-120 with new regimen  . Dyslipidemia, goal LDL below 70 01/29/2014  . Enlarged prostate   . Essential tremor    a. on proprnolol  . GERD (gastroesophageal reflux disease)   . Hx of CABG   . Hypertension   . Internal hemorrhoid   . Sleep apnea    does not use cpap (06/04/2015)  . TIA (transient ischemic attack) 2002    Family History  Problem Relation Age of Onset  . CAD Brother        2 brothers - CABG  . Alzheimer's disease Sister   . CAD Sister   . Prostate cancer Brother   . Asthma Brother        2 brothers   . Diabetes Other        entire family  . Colon cancer Neg Hx    Social History:   reports that he has never smoked. He has never used smokeless tobacco. He reports that he does not drink alcohol or use drugs.  Medications  Current Facility-Administered Medications:  .  Place/Maintain arterial line, , , Until Discontinued **AND** 0.9 %  sodium chloride infusion, , Intra-arterial, PRN, Omar Person, NP .  chlorhexidine (PERIDEX) 0.12 % solution 15 mL, 15 mL, Mouth Rinse, BID, Agarwala, Ravi, MD, 15 mL at 07/28/18 2100 .  Chlorhexidine Gluconate Cloth 2 % PADS 6 each, 6 each, Topical, Daily, Rigoberto Noel, MD, 6 each at 07/27/18 0400 .  dexamethasone (DECADRON) injection 1 mg, 1 mg, Intravenous, Q12H, Rigoberto Noel, MD, 1 mg at 07/28/18 2233 .  dicyclomine (BENTYL) 10 MG/5ML syrup 10 mg, 10 mg, Per Tube, BID, Rigoberto Noel, MD, 10 mg at 07/28/18 2237 .   enoxaparin (LOVENOX) injection 40 mg, 40 mg, Subcutaneous, Q24H, Kara Mead V, MD, 40 mg at 07/28/18 1150 .  erythromycin ophthalmic ointment 1 application, 1 application, Both Eyes, Daily PRN, Rigoberto Noel, MD .  famotidine (PEPCID) 40 MG/5ML suspension 20 mg, 20 mg, Per Tube, Daily, Agarwala, Ravi, MD, 20 mg at 07/28/18 2246 .  feeding supplement (PRO-STAT SUGAR FREE 64) liquid 30 mL, 30 mL, Per Tube, Daily, Kara Mead V, MD, 30 mL at 07/28/18 1150 .  feeding supplement (VITAL AF 1.2 CAL) liquid 1,000 mL, 1,000 mL, Per Tube, Continuous, Rigoberto Noel, MD, Last Rate: 65 mL/hr at 07/28/18 1640, 1,000 mL at 07/28/18 1640 .  fentaNYL (SUBLIMAZE) bolus via infusion 25 mcg, 25 mcg, Intravenous, Q1H PRN, Omar Person, NP .  fentaNYL (SUBLIMAZE) injection 50 mcg, 50 mcg, Intravenous, Once, Eubanks, Katalina M, NP .  fentaNYL 2587mcg in NS 228mL (63mcg/ml) infusion-PREMIX, 25-400 mcg/hr, Intravenous, Continuous, Eubanks, Katalina M, NP .  free water 100 mL, 100 mL, Per Tube, Q6H, Omar Person, NP .  insulin aspart (novoLOG) injection 0-20 Units, 0-20 Units, Subcutaneous, Q4H, Deterding, Guadelupe Sabin, MD, 7 Units at 07/28/18 2115 .  insulin detemir (LEVEMIR) injection 20 Units, 20 Units, Subcutaneous, Q12H, Deterding, Guadelupe Sabin, MD, 20 Units at 07/28/18 2234 .  lactated ringers infusion, , Intravenous, Continuous, Rigoberto Noel, MD, Last Rate: 10 mL/hr at 07/20/18 1500 .  MEDLINE mouth rinse, 15 mL, Mouth Rinse, q12n4p, Agarwala, Ravi, MD .  midazolam (VERSED) injection 1 mg, 1 mg, Intravenous, Q15 min PRN, Dewaine Oats, Katalina M, NP .  midazolam (VERSED) injection 1 mg, 1 mg, Intravenous, Q2H PRN, Omar Person, NP .  polyvinyl alcohol (LIQUIFILM TEARS) 1.4 % ophthalmic solution 1 drop, 1 drop, Both Eyes, TID, Rigoberto Noel, MD, 1 drop at 07/28/18 1616 .  prednisoLONE acetate (PRED FORTE) 1 % ophthalmic suspension 2 drop, 2 drop, Both Eyes, TID AC & HS, Rigoberto Noel, MD, 2 drop  at 07/28/18 1634 .  primidone (MYSOLINE) tablet 50 mg, 50 mg, Per Tube, BID, Rigoberto Noel, MD, 50 mg at 07/28/18 2235 .  rosuvastatin (CRESTOR) tablet 20 mg, 20 mg, Per Tube, Once per day on Tue Thu, Alva, Rakesh V, MD, 20 mg at 07/26/18 1039 .  sodium chloride flush (NS) 0.9 % injection 10-40 mL, 10-40 mL, Intracatheter, Q12H, Rigoberto Noel, MD, 10 mL at 07/27/18 2201 .  sodium chloride flush (NS) 0.9 % injection 10-40 mL, 10-40 mL, Intracatheter, PRN, Rigoberto Noel, MD, 20 mL at 07/27/18 0817  Exam: Current vital signs: BP 126/60   Pulse (!) 56   Temp 99.4 F (37.4 C) (Oral)   Resp 17   Ht 5\' 8"  (1.727 m)   Wt 118.2 kg   SpO2 100%   BMI 39.62 kg/m  Vital signs in last 24  hours: Temp:  [98.3 F (36.8 C)-99.9 F (37.7 C)] 99.4 F (37.4 C) (09/28 2334) Pulse Rate:  [54-103] 56 (09/29 0052) Resp:  [13-27] 17 (09/29 0052) BP: (89-192)/(50-101) 126/60 (09/29 0052) SpO2:  [37 %-100 %] 100 % (09/29 0052) FiO2 (%):  [40 %-60 %] 40 % (09/29 0143) Weight:  [160.7 kg] 118.2 kg (09/28 0500) General: Intubated, on fentanyl infusion HEENT: Normocephalic and traumatic, intubated CVS: S1 is heard, regular rate rhythm, hypertensive Respiratory: Vented, scattered rales Extremities: Warm well perfused Neurological exam Patient is currently intubated, on fentanyl infusion He is completely nonverbal. He opens eyes to loud voice and noxious stimulation as well as spontaneously. On initial exam it appeared that he had a slight right gaze preference which would be easily overcome by ocular cephalic maneuvers. He also had some occasional rhythmic twitching of his eyelids and both feet at the ankle which was very slow-unlike myoclonus. Pupils are round and reactive with mild anisocoria-right pupil slightly bigger than the left, he does not blink to threat from either side. Oculocephalics present with mild right gaze preference. No blink to threat Corneals intact Cough and gag intact Motor  exam: Does not move any extremities spontaneously or to noxious stimulation.  He does exhibit rhythmic dorsiflexion bilaterally at the ankle. Sensory exam: Does not respond to noxious stim in any 4. Cerebellar: Does not perform NIHSS-27  Labs I have reviewed labs in epic and the results pertinent to this consultation are: Hyperglycemia, hyponatremia, elevated BUN and creatinine, elevated mag and phosphorus, elevated lactate.  Leukocytosis white count 11.0 down from 14.6 of yesterday. CBC    Component Value Date/Time   WBC 11.0 (H) 07/29/2018 0033   RBC 4.43 07/29/2018 0033   HGB 12.9 (L) 07/29/2018 0033   HGB 12.6 (L) 03/28/2018 1214   HCT 40.2 07/29/2018 0033   HCT 38.0 03/28/2018 1214   PLT 289 07/29/2018 0033   PLT 263 03/28/2018 1214   MCV 90.7 07/29/2018 0033   MCV 85 03/28/2018 1214   MCH 29.1 07/29/2018 0033   MCHC 32.1 07/29/2018 0033   RDW 14.1 07/29/2018 0033   RDW 14.4 03/28/2018 1214   LYMPHSABS 1.7 07/19/2018 1716   MONOABS 0.9 07/19/2018 1716   EOSABS 0.2 07/19/2018 1716   BASOSABS 0.0 07/19/2018 1716   CMP     Component Value Date/Time   NA 150 (H) 07/29/2018 0033   NA 142 05/10/2018 1111   K 5.1 07/29/2018 0033   CL 112 (H) 07/29/2018 0033   CO2 25 07/29/2018 0033   GLUCOSE 260 (H) 07/29/2018 0033   BUN 42 (H) 07/29/2018 0033   BUN 11 05/10/2018 1111   CREATININE 1.48 (H) 07/29/2018 0033   CREATININE 1.33 (H) 12/31/2015 0849   CALCIUM 9.0 07/29/2018 0033   PROT 6.7 07/28/2018 0250   PROT 6.2 05/10/2018 1110   ALBUMIN 2.8 (L) 07/28/2018 0250   ALBUMIN 3.7 05/10/2018 1110   AST 62 (H) 07/28/2018 0250   ALT 143 (H) 07/28/2018 0250   ALKPHOS 117 07/28/2018 0250   BILITOT 0.8 07/28/2018 0250   BILITOT 0.4 05/10/2018 1110   GFRNONAA 44 (L) 07/29/2018 0033   GFRAA 51 (L) 07/29/2018 0033   Imaging I have reviewed the images obtained: MRI examination of the brain-from 07/23/2018 showed increased T2 signal in the caudate and lentiform nuclei with a  heterogenous reduced diffusion compatible with hypoxic ischemic injury of the brain in the setting of cardiac arrest at the time.  No hemorrhage or mass-effect was seen at  that time.  Assessment: 77 year old man who had been initially seen by neurology service with paraparesis following cardiac arrest after recent spinal surgery, with imaging at that time only concerning for hypoxic ischemic injury to the brain had another cardiac arrest tonight after which she had started exhibiting some right gaze preference as well as rhythmic low amplitude and slow jerking of his bilateral ankles exhibiting dorsiflexion bilaterally with no purposeful movements. Neurology consulted for concern for worsening anoxic injury versus seizures at this time. He could be having subclinical seizures at this time, this is difficult to ascertain.  He will definitely require an EEG in the morning but I would recommend getting a CT scan as soon as he is stable to go for 1 to ensure that there is no acute intracranial process.  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  Might need continuous EEG if routine EEG is concerning for epileptiform activity. Patient can be given a dose of Depakote and assess clinically if that resolves the jerking episodes bilaterally this is some sort of anoxic myoclonus and/or epileptiform activity. Correction of toxic metabolic derangements per primary team as you are. MRI brain without contrast at some point when more stable.  Neurology service will continue to follow with you.  -- Amie Portland, MD [facility] Pager: [phone] If 7pm to 7am, please call on call as listed on AMION.  CRITICAL  CARE ATTESTATION This patient is critically ill and at significant risk of neurological worsening, death and care requires constant monitoring of vital signs, hemodynamics, respiratory, and cardiac monitoring. I spent 45  minutes of neurocritical care time performing neurological assessment, discussion with family, other specialists and medical decision making of high complexity in the care of  this patient.   ADDENDUM CTH with possible changes of hypoxic ischemic insult.

## 2018-07-29 NOTE — Progress Notes (Signed)
CRITICAL VALUE ALERT  Critical Value: Lactic 3.4  Date & Time Notied:  0130 07/29/18 Provider Notified:

## 2018-07-29 NOTE — Progress Notes (Signed)
@   1900- Pt was restless/ agitated/ actively trying to get OOB. With previous fall Hx, RN requested a bedside sitter for patient safety. Floor mats were also along bedside for safety.   Pt had cardiac/ respiratory arrest around 2345 & unresponsive. Code Blue initiated. See code sheet.  Post code Pts neuro status markedly diminished, only responsive to noxious stimuli/ pain. Pt no longer trying to get OOB.  Due to this change, bed side safety sitter no longer needed. Neuro consulted, STAT head CT obtained and other orders followed.

## 2018-07-29 NOTE — Progress Notes (Signed)
Spoke with Lenna Sciara, Patients' brother and HCPOA. He stated he is on his way to [facility] with the paperwork stating he is his HCPOA and requested that No CPR or medications be given in the event of a cardiac arrest. Dr.Alva made aware of his wishes and patient made DNR.

## 2018-07-29 NOTE — Procedures (Addendum)
Intubation Procedure Note Edward [name] [id number] January 31, 1941  Procedure: Intubation @ 2358 Indications: Respiratory insufficiency  Procedure Details Consent: Unable to obtain consent because of emergent medical necessity. Time Out: Verified patient identification, verified procedure, site/side was marked, verified correct patient position, special equipment/implants available, medications/allergies/relevent history reviewed, required imaging and test results available.  Performed  Maximum sterile technique was used including gloves, hand hygiene and mask.  MAC and 4 ETT 7.5 24 at lip  Evaluation Hemodynamic Status: Transient hypertension requiring treatment; O2 sats: stable throughout Patient's Current Condition: stable Complications: No apparent complications Patient did tolerate procedure well. Chest X-ray ordered to verify placement.  CXR: pending.   Edward [name] 07/29/2018

## 2018-07-30 DIAGNOSIS — G931 Anoxic brain damage, not elsewhere classified: Secondary | ICD-10-CM

## 2018-07-30 DIAGNOSIS — I119 Hypertensive heart disease without heart failure: Secondary | ICD-10-CM

## 2018-07-30 LAB — BASIC METABOLIC PANEL
Anion gap: 7 (ref 5–15)
BUN: 38 mg/dL — ABNORMAL HIGH (ref 8–23)
CHLORIDE: 113 mmol/L — AB (ref 98–111)
CO2: 30 mmol/L (ref 22–32)
CREATININE: 1.21 mg/dL (ref 0.61–1.24)
Calcium: 7.5 mg/dL — ABNORMAL LOW (ref 8.9–10.3)
GFR calc Af Amer: >60 mL/min (ref >60)
GFR, EST NON AFRICAN AMERICAN: 56 mL/min — AB (ref >60)
GLUCOSE: 269 mg/dL — AB (ref 70–99)
POTASSIUM: 3.9 mmol/L (ref 3.5–5.1)
SODIUM: 150 mmol/L — AB (ref 135–145)

## 2018-07-30 LAB — MAGNESIUM: MAGNESIUM: 2.5 mg/dL — AB (ref 1.7–2.4)

## 2018-07-30 LAB — GLUCOSE, CAPILLARY
GLUCOSE-CAPILLARY: 239 mg/dL — AB (ref 70–99)
GLUCOSE-CAPILLARY: 172 mg/dL — AB (ref 70–99)
Glucose-Capillary: 129 mg/dL — ABNORMAL HIGH (ref 70–99)
Glucose-Capillary: 135 mg/dL — ABNORMAL HIGH (ref 70–99)
Glucose-Capillary: 144 mg/dL — ABNORMAL HIGH (ref 70–99)
Glucose-Capillary: 181 mg/dL — ABNORMAL HIGH (ref 70–99)
Glucose-Capillary: 190 mg/dL — ABNORMAL HIGH (ref 70–99)
Glucose-Capillary: 232 mg/dL — ABNORMAL HIGH (ref 70–99)
Glucose-Capillary: 242 mg/dL — ABNORMAL HIGH (ref 70–99)
Glucose-Capillary: 244 mg/dL — ABNORMAL HIGH (ref 70–99)
Glucose-Capillary: 264 mg/dL — ABNORMAL HIGH (ref 70–99)
Glucose-Capillary: 268 mg/dL — ABNORMAL HIGH (ref 70–99)

## 2018-07-30 MED ORDER — FREE WATER
300.0000 mL | Freq: Four times a day (QID) | Status: DC
  Administered 2018-07-30 – 2018-07-31 (×4): 300 mL

## 2018-07-30 MED ORDER — INSULIN ASPART 100 UNIT/ML ~~LOC~~ SOLN: 2.0000 [IU] | SUBCUTANEOUS | Status: DC

## 2018-07-30 MED ORDER — SODIUM CHLORIDE 0.9 % IV SOLN
INTRAVENOUS | Status: DC
  Administered 2018-07-30: 1.8 [IU]/h via INTRAVENOUS
  Administered 2018-07-31: 8 [IU]/h via INTRAVENOUS
  Administered 2018-08-01: 6.8 [IU]/h via INTRAVENOUS
  Filled 2018-07-30 (×3): qty 1

## 2018-07-30 MED ORDER — LEVETIRACETAM IN NACL 500 MG/100ML IV SOLN
500.0000 mg | Freq: Two times a day (BID) | INTRAVENOUS | Status: DC
  Administered 2018-07-30 – 2018-08-05 (×13): 500 mg via INTRAVENOUS
  Filled 2018-07-30 (×14): qty 100

## 2018-07-30 MED ORDER — INSULIN DETEMIR 100 UNIT/ML ~~LOC~~ SOLN: 20.0000 [IU] | Freq: Two times a day (BID) | SUBCUTANEOUS | Status: DC

## 2018-07-30 MED ORDER — DIVALPROEX SODIUM 125 MG PO CSDR
500.0000 mg | DELAYED_RELEASE_CAPSULE | Freq: Two times a day (BID) | ORAL | Status: DC
  Filled 2018-07-30: qty 4

## 2018-07-30 MED ORDER — DEXTROSE 5 % IV SOLN
INTRAVENOUS | Status: AC
  Administered 2018-07-30 – 2018-07-31 (×3): via INTRAVENOUS

## 2018-07-30 MED ORDER — PRO-STAT SUGAR FREE PO LIQD
30.0000 mL | Freq: Three times a day (TID) | ORAL | Status: DC
  Administered 2018-07-30 – 2018-08-03 (×12): 30 mL
  Filled 2018-07-30 (×12): qty 30

## 2018-07-30 MED ORDER — HYDRALAZINE HCL 25 MG PO TABS
25.0000 mg | ORAL_TABLET | Freq: Three times a day (TID) | ORAL | Status: DC
  Administered 2018-07-30 – 2018-08-07 (×25): 25 mg via ORAL
  Filled 2018-07-30 (×25): qty 1

## 2018-07-30 MED ORDER — VITAL HIGH PROTEIN PO LIQD
1000.0000 mL | ORAL | Status: DC
  Administered 2018-07-30 – 2018-08-02 (×2): 1000 mL
  Filled 2018-07-30 (×2): qty 1000

## 2018-07-30 MED ORDER — FUROSEMIDE 10 MG/ML IJ SOLN
40.0000 mg | Freq: Four times a day (QID) | INTRAMUSCULAR | Status: AC
  Administered 2018-07-30 (×3): 40 mg via INTRAVENOUS
  Filled 2018-07-30 (×3): qty 4

## 2018-07-30 MED ORDER — INSULIN ASPART 100 UNIT/ML ~~LOC~~ SOLN: 0.0000 [IU] | SUBCUTANEOUS | Status: DC

## 2018-07-30 MED ORDER — POTASSIUM CHLORIDE 20 MEQ/15ML (10%) PO SOLN
40.0000 meq | Freq: Three times a day (TID) | ORAL | Status: AC
  Administered 2018-07-30 (×2): 40 meq
  Filled 2018-07-30 (×2): qty 30

## 2018-07-30 MED ORDER — DIVALPROEX SODIUM 500 MG PO DR TAB: 500.0000 mg | DELAYED_RELEASE_TABLET | Freq: Two times a day (BID) | ORAL | Status: DC

## 2018-07-30 NOTE — Progress Notes (Signed)
OT Cancellation Note  Patient Details Name: Edward [name] MRN: [id number] DOB: Feb 24, 1941   Cancelled Treatment:    Reason Eval/Treat Not Completed: Medical issues which prohibited therapy. Spoke with RN and states she would wait on working with patient today--pt on wean mode and she just gave him fentanyl due to restlessness. Will continue to follow for appropriateness for re-eval. Edward [name], OTR/L Acute Rehab Services Pager [phone] Office [phone]     Almon Register 07/30/2018, 1:14 PM

## 2018-07-30 NOTE — Progress Notes (Signed)
Nutrition Follow-up  DOCUMENTATION CODES:   Obesity unspecified  INTERVENTION:   -Vital High Protein @ 50 ml/hr (1200 ml) via Cortrak  -30 ml Prostat TID  Provides: 1500 kcals, 150 grams protein, 1003 ml free water. Meets 100% of needs.  NUTRITION DIAGNOSIS:   Inadequate oral intake related to acute illness as evidenced by NPO status.  Ongoing  GOAL:   Patient will meet greater than or equal to 90% of their needs  Met with TF  MONITOR:   TF tolerance, Weight trends, Labs, Skin  REASON FOR ASSESSMENT:   Consult, Ventilator    ASSESSMENT:    77 yo male admitted with multilevel spondylosis with RUE radiculopathy and myelopathy s/p surgery on 9/16. Pt suffered cardiac arrest on 9/19 and was resuscitated, intubated, transferred to ICU and started on TTM. PMH includes DM, CAD/CABG, chronic diastolic heart failure, CKD II/III   9/16 anterior cervical disc fusion 9/19 cardiac arrest, intubated, TTM initiated 9/26-extubated 9/27- gastric cortrak placed  9/28- cortrak dislodged, replaced postpyloric  9/29- arrest/PEA, intubated  Pt awake asking for ETT to be removed upon RD visit. RN reports no plans for extubation today. Cardiology considering repeat cath if pt makes meaningful recovery. Vital AF 1.2 running at 65 ml/hr at this time. RD to adjust TF now that pt is requiring ventilation (will use dry weight of 115.2 kg).   Patient is currently intubated on ventilator support MV: 12.6 L/min Temp (24hrs), Avg:98.7 F (37.1 C), Min:97.9 F (36.6 C), Max:99.3 F (37.4 C) BP: 171/65 MAP: 93  I/O: -6.9 L since admit UOP: 1180 ml x 24 hrs   Weight noted to decrease from 271 lb on 9/27 to 253 lb today. Likely fluid related. Will continue to monitor trends.   Medications reviewed and include: prednisonlone, SSI, Levemir Labs reviewed: Na 151 (H) CBG 134-297 corrected calcium 9.9 (wdl)  Diet Order:   Diet Order            Diet NPO time specified  Diet effective now         Diet Carb Modified              EDUCATION NEEDS:   Not appropriate for education at this time  Skin:  Skin Assessment: Skin Integrity Issues: Skin Integrity Issues:: Incisions Incisions: neck  Last BM:  07/30/18  Height:   Ht Readings from Last 1 Encounters:  07/20/18 '5\' 8"'  (1.727 m)    Weight:   Wt Readings from Last 1 Encounters:  07/30/18 115.2 kg    Ideal Body Weight:  70 kg  BMI:  Body mass index is 38.62 kg/m.  Estimated Nutritional Needs:   Kcal:  0865- 1613 kcal  Protein:  140-160 grams   Fluid:  >/= 1.6 L/day  Mariana Single RD, LDN Clinical Nutrition Pager # - [phone]

## 2018-07-30 NOTE — Progress Notes (Signed)
NAME:  Edward [name], MRN:  [id number], DOB:  11/22/1940, LOS: 73 ADMISSION DATE:  07/13/2018, CONSULTATION DATE: 9/19 REFERRING MD:  Roylene Reason, CHIEF COMPLAINT:  Cardiac arrest    Brief History   77 year old patient with history of diabetes with history of difficult glycemic control and cervical stenosis resulting in cord compression underwent anterior cervical disc fusion of cervical spine on September 16.  On 9/19 began to notice difficulty swallowing, not able to even swallow his own saliva.  Had been ordered Decadron but this was not given.  He was found by nursing staff unresponsive and in asystole.  He underwent 1 round of ACLS with successful return of spontaneous circulation.  Tx to ICU for hypothermia protocol.   Significant Hospital Events   9/16: Anterior cervical disc fusion 3/24: Asystolic arrest.  Estimated time to return of spontaneous circulation approximately 3 minutes.  Unresponsive not following commands postarrest so hypothermia protocol initiated.  Goal temperature met at: 1030 pm  9/20: Hemodynamically stable on low-dose norepinephrine 9/21: completed rewarming. Paralytic and sedation discontinued. 9/22: paralysis/sedation and vasopressor therapy discontinued yesterday. Restlessness developed last night. IV fentanyl started. Reqired iv labetalol to treat HTN. Has a h/o HTN. 9/24>> Developed fever Vanc/Zosyn started 9/25 weaned off sedation and pressors.  Weaning but copious secretions. 9/26>> Extubated to 4 L [place] 9/27>> Failed swallow, weaned to RA 9/30>>>Spoke with family extensively, brother is POA, remains hypernatremic and slowly weaning.  No trach/peg.  One way extubation when optimized.   Consults: date of consult/date signed off & final recs:  Neuro 9/22>>> Cardiology 9/20>>>  Procedures (surgical and bedside):  9/16: Anterior cervical disc fusion by neurosurgery 9/19: Oral endotracheal tube>> 9/26 9/19: Left internal jugular vein triple-lumen catheter>>>  9/27  Significant Diagnostic Tests:  9/23 MRI Head Caudate and lentiform nuclei increased T2 signal and heterogeneous reduced diffusion compatible with hypoxic ischemic injury of the brain in the setting of cardiac arrest. No hemorrhage, mass effect, or herniation 9/23 MRI cervical spine: No acute osseous or cord signal abnormality identified. Interval C3-C7 anterior cervical fusion and discectomy. Small prevertebral fluid collection extending into the right anterior neck compatible with postsurgical changes. No epidural collection. Mildly improved patency of the cervical spinal canal. Stable residual multilevel foraminal stenosis from uncovertebral and facet Hypertrophy. 9/23 MRI thoracic spine Mild thoracic spine spondylosis. No high-grade foraminal or canal Stenosis 9/19: CT brain noncontrasted: Negative CT cervical spine 9/19: Status post C3-C7 ACDF with expected postoperative changes.  No fracture or malalignment 2D echo 9/20 >>>Left ventricle: The cavity size was normal. There was severe   concentric hypertrophy. Systolic function was vigorous. The   estimated ejection fraction was in the range of 65% to 70%. Wall   motion was normal; there were no regional wall motion   abnormalities. Doppler parameters are consistent with abnormal   left ventricular relaxation (grade 1 diastolic dysfunction).   Doppler parameters are consistent with elevated mean left atrial   filling pressure. - Mitral valve: Valve area by pressure half-time: 2.04 cm^2. - Left atrium: The atrium was mildly dilated. 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  rate (!) 23, height '5\' 8"'  (1.727 m),  weight 115.2 kg, SpO2 100 %.    Vent Mode: PCV FiO2 (%):  [40 %] 40 % Set Rate:  [20 bmp] 20 bmp PEEP:  [5 cmH20] [place] Pressure:  [13 cmH20-16 cmH20] 14 cmH20   Intake/Output Summary (Last 24 hours) at 07/30/2018 1047 Last data filed at 07/30/2018 0500 Gross per 24 hour  Intake 3735.72 ml  Output 1480 ml  Net 2255.72 ml   Filed Weights   07/28/18 0500 07/29/18 0400 07/30/18 0409  Weight: 118.2 kg 112 kg 115.2 kg   Examination: General: Chronically ill appearing male, NAD HEENT: [place]/AT, PERRL, EOM-I and MMM Neuro: Awake and interactive, moving all ext to command CV: RRR, Nl S1/S2 and -M/R/G PULM: CTA bilaterally GI: Soft, NT, ND and +BS Extremities: 1+ edema and -tenderness Skin: Intact  Resolved Hospital Problem list   Respiratory failure Metabolic encephalopathy  Assessment & Plan:  Status post asystolic cardiac arrest.  Presumed hypoxia mediated given prior OSA and report of inadequate airway protection pre-arrest. H/O coronary artery disease and CABG back in 2019 with chronic diastolic heart failure HTN  Net negative 5 L since admission Plan Tele monitoring Continue lasix 40 mg IV q6 x3 doses  Ventilator dependence status post cardiopulmonary arrest>> resolved H/o OSA  Plan Tolerated extubation 9/26 Weak cough>> at risk for airway protection issues  Failed Swallow 9/27 Plan: Titrate O2 for sat of 88-92% Aggressive Pulmonary toilet  OOB to chair Begin PS trials but no extubation Once extubated, full DNR  ID Aspiration pneumonia CXR resolving ABX discontinued 9/26 No labs 9/27 to evaluate WBC No CXR 9/27  High risk aspiration as failed swallow  Plan: Trend PCT prn CBC 9/28 CXR 9/28 Continue to follow leukocytosis and fever.  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 Awake and alert  Plan: Ongoing Monitoring  mental status Drop Na, Free water Lasix D5W at 50 ml/hr x24 hours   Chronic kidney disease stage III Plan: Trend BMET>> ordered for 9/28 Monitor UO Avoid nephrotoxic medications BMET in AM Replace electrolytes as indicated  GI Failed Swallow 9/27 Weak cough>> high risk aspiration Weak and deconditioned  Plan: Place Cor Track TF per nutrition Speech therapy for continued evaluation and support  Lab Results  Component Value Date   CREATININE 1.44 (H) 07/29/2018   CREATININE 1.48 (H) 07/29/2018   CREATININE 1.33 (H) 07/28/2018   CREATININE 1.33 (H) 12/31/2015    Intake/Output Summary (Last 24 hours) at 07/30/2018 1047 Last data filed at 07/30/2018 0500 Gross per 24 hour  Intake 3735.72 ml  Output 1480 ml  Net 2255.72 ml     Diabetes   Plan  Transition to sliding scale insulin per protocol Note he is on steroids Tube feedings were stopped during the night for vomiting. Insulin drip  Status post anterior cervical disc fusion 9/16 Plan Currently on Decadron 2 mg every 8 hours Wean steroids.   Disposition / Summary of Today's Plan 07/30/18   Spoke with family at length, once ready one way extubation    Diet: enteral feeding Pain/Anxiety/Delirium protocol (if indicated): PAD protocol VAP protocol (if indicated) ordered 9/20 DVT prophylaxis: low molecular weight heparin  GI prophylaxis: H2 blockade Hyperglycemia protocol: Hyperglycemia protocol Mobility: Bedrest Code  Status: Full code Family Communication: 07/25/2018 no family at bedside patient updated  Labs   CBC: Recent Labs  Lab 07/24/18 0422 07/25/18 0339 07/28/18 0250 07/29/18 0033  WBC 18.5* 20.9* 14.6* 11.0*  HGB 11.6* 12.5* 13.4 12.9*  HCT 36.6* 39.5 42.9 40.2  MCV 89.5  89.6 90.5 90.7  PLT 209 242 272 891   Basic Metabolic Panel: Recent Labs  Lab 07/25/18 0339 07/26/18 0414 07/28/18 0250 07/29/18 0033 07/29/18 0330  NA 146* 145 150* 150* 151*  K 4.2 4.0 3.9 5.1 4.1  CL 108 107 112* 112* 112*  CO2 '28 31 29 25 28  ' GLUCOSE 227* 254* 82 260* 296*  BUN 37* 38* 37* 42* 45*  CREATININE 1.08 1.16 1.33* 1.48* 1.44*  CALCIUM 8.5* 8.5* 8.8* 9.0 8.5*  MG 2.5* 2.4  --  2.5*  --   PHOS  --  3.5  --  7.0*  --    GFR: Estimated Creatinine Clearance: 53.8 mL/min (A) (by C-G formula based on SCr of 1.44 mg/dL (H)). Recent Labs  Lab 07/24/18 0422 07/25/18 0339 07/28/18 0250 07/29/18 0033 07/29/18 0323  PROCALCITON  --  0.60  --   --   --   WBC 18.5* 20.9* 14.6* 11.0*  --   LATICACIDVEN  --   --   --  3.8* 1.3   Liver Function Tests: Recent Labs  Lab 07/28/18 0250 07/29/18 0330  AST 62* 549*  ALT 143* 528*  ALKPHOS 117 128*  BILITOT 0.8 0.6  PROT 6.7 5.4*  ALBUMIN 2.8* 2.3*   No results for input(s): LIPASE, AMYLASE in the last 168 hours. No results for input(s): AMMONIA in the last 168 hours. ABG    Component Value Date/Time   PHART 7.436 07/29/2018 0056   PCO2ART 48.9 (H) 07/29/2018 0056   PO2ART 188.0 (H) 07/29/2018 0056   HCO3 32.9 (H) 07/29/2018 0056   TCO2 34 (H) 07/29/2018 0056   ACIDBASEDEF 2.0 11/09/2016 2003   O2SAT 100.0 07/29/2018 0056    Coagulation Profile: No results for input(s): INR, PROTIME in the last 168 hours. Cardiac Enzymes: Recent Labs  Lab 07/29/18 0033 07/29/18 0330 07/29/18 1254  TROPONINI 0.07* 0.09* 0.07*   HbA1C: Hgb A1c MFr Bld  Date/Time Value Ref Range Status  06/19/2018 10:56 AM 9.5 (H) 4.6 - 6.5 % Final    Comment:    Glycemic Control Guidelines for People with Diabetes:Non Diabetic:  <6%Goal of Therapy: <7%Additional Action Suggested:  >8%   03/13/2018 10:53 AM 10.2 (H) 4.6 - 6.5 % Final    Comment:    Glycemic Control Guidelines for People with Diabetes:Non Diabetic:  <6%Goal of Therapy:  <7%Additional Action Suggested:  >8%    CBG: Recent Labs  Lab 07/29/18 1553 07/29/18 1947 07/29/18 2324 07/30/18 0403 07/30/18 0729  GLUCAP 230* 183* 134* 190* 268*   The patient is critically ill with multiple organ systems failure and requires high complexity decision making for assessment and support, frequent evaluation and titration of therapies, application of advanced monitoring technologies and extensive interpretation of multiple databases.   Critical Care Time devoted to patient care services described in this note is  36  Minutes. This time reflects time of care of this signee Dr Jennet Maduro. This critical care time does not reflect procedure time, or teaching time or supervisory time of PA/NP/Med student/Med Resident etc but could involve care discussion time.  Rush Farmer, M.D. [facility] Pulmonary/Critical Care Medicine. Pager: [phone]. After hours pager: [phone].

## 2018-07-30 NOTE — Progress Notes (Signed)
Speech Language Pathology Discharge Patient Details Name: Edward [name] MRN: [id number] DOB: 1941-02-15 Today's Date: 07/30/2018 Time:  -     Patient discharged from SLP services secondary to medical decline - will need to re-order SLP to resume therapy services. On vent  Please see latest therapy progress note for current level of functioning and progress toward goals.    Progress and discharge plan discussed with patient and/or caregiver: Patient unable to participate in discharge planning and no caregivers available. Please reconsult if/when appropriate.  GO     [name] 07/30/2018, 9:06 AM   [name] Colvin Caroli.Ed Risk analyst [phone] Office [phone]

## 2018-07-30 NOTE — Progress Notes (Signed)
77 year old obese diabetic underwent ACDF on 9/16 and developed asystolic cardiac arrest 1/83, underwent therapeutic hypothermia, rewarmed on 3/58  Course  - complicated by severe hypertension and agitation requiring Precedex. He was successfully extubated 9/26  MRI of C-spine did not show any evidence of hematoma, brain did not show any evidence of anoxic encephalopathy and thoracic spine did not show any reason for leg weakness. He was seen by cardiology and due to recent negative cath, felt not to be an ischemic event but related to hypoxia.  He sustained another cardiac arrest/PEA 9/29, received epi x3 and then developed VF requiring shock, total downtime documented as 7 minutes but head CT concerning of loss of gray and white matter.  He also had clonic or myoclonic activity  On exam-unresponsive to deep pain stimulus, eyes half open, decreased breath sounds bilateral, obese deconditioned male, S1-S2 regular on monitor, 1+ edema  Chest x-ray personally reviewed which does not show any new infiltrates. EEG showed diffuse low voltage  Labs personally reviewed which shows hyperglycemia, mild hyponatremia, low positive troponins and lactate of 3.8.  Impression/plan Recurrent cardiac arrest -initially felt to be hypoxia mediated, no arrhythmias while on telemetry for so many days, have asked cardiology/EP to reconsult Now hypotensive likely postarrest related to echogenic shock, if remains so we will obtain echo He was otherwise severely hypertensive requiring amlodipine, propranolol and hydralazine all of which are on hold  Acute respiratory failure-ventilator settings were reviewed and adjusted  Acute encephalopathy-concern for anoxia, MRI planned. Regardless would wait 2 to 3 days prior to neuro prognostication.  Detailed discussion with brother at the bedside, updated them of current events.  He states that he has a living will and would not want to be in a nursing home.  He was  hesitant initially to agree to DNR but later on came back in the evening and agreed to no CPR no cardioversion.  Further goals of care to be defined based on neuro prognostication  My critical care time x 7m Kara Mead MD. FCCP. [place] [facility] Pager [phone] If no response call [phone]   07/29/18

## 2018-07-30 NOTE — Progress Notes (Addendum)
Subjective: Greatly improved  Exam: Vitals:   07/30/18 0732 07/30/18 0800  BP: (!) 112/41 (!) 193/83  Pulse: 72 92  Resp: 20 (!) 21  Temp:    SpO2: 100% 98%   Gen: In bed, intubated Resp: ventilated Abd: soft, nt  Neuro: MS: awake, alert, follows commands readily. Answers yes/no questions with shakes/nods IW:LNLGX, crosses midline with EOM in both directions Motor: lifts all 4 extremities against gravity.  Sensory:endorses symmetric normal sensation.   Pertinent Labs: LFTs - elevated  MRI brain - no progression of anoxic injury EEG reviewed- low voltage irregular delta activity. No sz seen.   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 At this time, there is question of seizure at the time of his arrest, and I think that it may be reasonable to continue an anti-epileptic(will use keppra given elevated LFTs) for the time being, but once extubated and seizures would be easier to see, I do not feel that it would need to be continued long term. I am not certain that this was a seizure. With his improvement, suspect that he has a good prognosis from an anoxic injury perspective.   Recommendations: 1) keppra 500mg  BID(Stop once extubated and stable) 2) please call with further quesitons or concerns.   Roland Rack, MD [facility] [phone]  If 7pm- 7am, please page neurology on call as listed in [place].

## 2018-07-30 NOTE — Consult Note (Signed)
[facility] Nurse wound consult note Reason for Consult: Pt was noted to have a deep tissue injury to upper lip when the ET tube was moved Wound type: Medical device related pressure injury; DTPI to upper lip Pressure Injury POA: No Measurement: .5X.5cm Wound bed: dark purple Periwound:skin beginning to dry and peel slightly Dressing procedure/placement/frequency: Vaseline to affected area to promote moist healing, keep ET tube off the affected area as much as possible.  [place] team will continue to assess weekly. Julien Girt MSN, RN, [place], [place], [place]

## 2018-07-30 NOTE — Progress Notes (Signed)
Progress Note  Patient Name: Edward [name] Date of Encounter: 07/30/2018  Primary Cardiologist: Sinclair Grooms, MD   Subjective   intubated  Inpatient Medications    Scheduled Meds: . chlorhexidine gluconate (MEDLINE KIT)  15 mL Mouth Rinse BID  . dicyclomine  10 mg Per Tube BID  . enoxaparin (LOVENOX) injection  40 mg Subcutaneous Q24H  . famotidine  20 mg Per Tube Daily  . feeding supplement (PRO-STAT SUGAR FREE 64)  30 mL Per Tube Daily  . free water  100 mL Per Tube Q6H  . insulin aspart  0-20 Units Subcutaneous Q4H  . insulin detemir  20 Units Subcutaneous Q12H  . mouth rinse  15 mL Mouth Rinse 10 times per day  . polyvinyl alcohol  1 drop Both Eyes TID  . prednisoLONE acetate  2 drop Both Eyes TID AC & HS  . primidone  50 mg Per Tube BID  . rosuvastatin  20 mg Per Tube Once per day on Tue Thu   Continuous Infusions: . sodium chloride    . feeding supplement (VITAL AF 1.2 CAL) 1,000 mL (07/30/18 0150)  . lactated ringers 10 mL/hr at 07/30/18 0500  . levETIRAcetam 500 mg (07/30/18 1008)  . norepinephrine (LEVOPHED) Adult infusion Stopped (07/29/18 1606)   PRN Meds: Place/Maintain arterial line **AND** sodium chloride, erythromycin, fentaNYL (SUBLIMAZE) injection, midazolam, midazolam   Vital Signs    Vitals:   07/30/18 0732 07/30/18 0800 07/30/18 0900 07/30/18 1000  BP: (!) 112/41 (!) 193/83 (!) 127/54 (!) 171/65  Pulse: 72 92 71 87  Resp: 20 (!) 21 18 (!) 23  Temp:      TempSrc:      SpO2: 100% 98% 99% 100%  Weight:      Height:        Intake/Output Summary (Last 24 hours) at 07/30/2018 1032 Last data filed at 07/30/2018 0500 Gross per 24 hour  Intake 3735.72 ml  Output 1480 ml  Net 2255.72 ml   Filed Weights   07/28/18 0500 07/29/18 0400 07/30/18 0409  Weight: 118.2 kg 112 kg 115.2 kg    Telemetry    NSR, PVCs - Personally Reviewed  ECG    NSR, lateral T wave inversions - Personally Reviewed  Physical Exam   GEN: No acute distress.    Neck: No JVD Cardiac: RRR, no murmurs, rubs, or gallops.  Respiratory: Clear to auscultation bilaterally. GI: Soft, nontender, non-distended  MS: No edema; No deformity. Neuro:  Nonfocal  Psych: Normal affect   Labs    Chemistry Recent Labs  Lab 07/28/18 0250 07/29/18 0033 07/29/18 0330  NA 150* 150* 151*  K 3.9 5.1 4.1  CL 112* 112* 112*  CO2 _0 GLUCOSE 82 260* 296*  BUN 37* 42* 45*  CREATININE 1.33* 1.48* 1.44*  CALCIUM 8.8* 9.0 8.5*  PROT 6.7  --  5.4*  ALBUMIN 2.8*  --  2.3*  AST 62*  --  549*  ALT 143*  --  528*  ALKPHOS 117  --  128*  BILITOT 0.8  --  0.6  GFRNONAA 50* 44* 46*  GFRAA 58* 51* 53*  ANIONGAP _1 Hematology Recent Labs  Lab 07/25/18 0339 07/28/18 0250 07/29/18 0033  WBC 20.9* 14.6* 11.0*  RBC 4.41 4.74 4.43  HGB 12.5* 13.4 12.9*  HCT 39.5 42.9 40.2  MCV 89.6 90.5 90.7  MCH 28.3 28.3 29.1  MCHC 31.6 31.2 32.1  RDW 13.9 14.1  14.1  PLT 242 272 289    Cardiac Enzymes Recent Labs  Lab 07/29/18 0033 07/29/18 0330 07/29/18 1254  TROPONINI 0.07* 0.09* 0.07*   No results for input(s): TROPIPOC in the last 168 hours.   BNP Recent Labs  Lab 07/28/18 0250  BNP 48.3     DDimer No results for input(s): DDIMER in the last 168 hours.   Radiology    Ct Head Wo Contrast  Result Date: 07/29/2018 CLINICAL DATA:  Altered level of consciousness. History of cardiac arrest July 19, 2018 resulting in hypoxic ischemic injury. Cardiac arrest July 28, 2018. EXAM: CT HEAD WITHOUT CONTRAST TECHNIQUE: Contiguous axial images were obtained from the base of the skull through the vertex without intravenous contrast. COMPARISON:  MRI of the head July 23, 2018 and CT HEAD July 19, 2018 FINDINGS: BRAIN: No intraparenchymal hemorrhage, mass effect nor midline shift. The ventricles and sulci are normal for age. Mild generally decreased gray-white matter differentiation. No acute large vascular territory infarcts. No abnormal  extra-axial fluid collections. Basal cisterns are patent. VASCULAR: Mild calcific atherosclerosis of the carotid siphons. SKULL: No skull fracture. Old nasal bone fractures. No significant scalp soft tissue swelling. SINUSES/ORBITS: Trace paranasal sinus mucosal thickening. Mastoid air cells are well aerated.The included ocular globes and orbital contents are non-suspicious. Status post bilateral ocular lens implants. OTHER: Life-support lines in place. IMPRESSION: Early changes of hypoxic ischemic injury. These results will be called to the ordering clinician or representative by the Radiologist Assistant, and communication documented in the PACS or zVision Dashboard. Electronically Signed   By: Elon Alas M.D.   On: 07/29/2018 03:15   Mr Brain Wo Contrast  Result Date: 07/29/2018 CLINICAL DATA:  Encephalopathy. Recent cervical fusion spinal surgery. Cardiac arrest, with hypoxic ischemic insult. Decreased level of consciousness following second cardiac arrest. EXAM: MRI HEAD WITHOUT CONTRAST TECHNIQUE: Multiplanar, multiecho pulse sequences of the brain and surrounding structures were obtained without intravenous contrast. COMPARISON:  CT head 07/29/2018. MR head 07/23/2018. MR head 07/13/2018. FINDINGS: Brain: Abnormal T2 and FLAIR hyperintense lentiform nuclei, corresponding restricted diffusion, consistent with hypoxic ischemic injury, but no significant progression from 07/23/2018. Caudate involvement is not established. Moderate atrophy, stable. Chronic microvascular ischemic change, not progressed. Vascular: Normal flow voids. Skull and upper cervical spine: Normal marrow signal. Sinuses/Orbits: No concerning features. BILATERAL cataract extraction. Other: Layering fluid in the nasopharynx.  Trace mastoid effusions. IMPRESSION: Abnormal T2 and FLAIR hyperintense lentiform nuclei, corresponding restricted diffusion consistent with acute hypoxic ischemic injury, but no significant progression from  07/23/2018. Chronic changes as described. Electronically Signed   By: Staci Righter M.D.   On: 07/29/2018 14:09   Dg Chest Port 1 View  Result Date: 07/29/2018 CLINICAL DATA:  Post intubation EXAM: PORTABLE CHEST 1 VIEW COMPARISON:  07/28/2018 FINDINGS: Endotracheal tube terminates 3 cm above the carina. Enteric tube courses below the diaphragm. Low lung volumes. Left basilar atelectasis. No frank interstitial edema. No pleural effusion or pneumothorax. The heart is normal in size. Defibrillator pads overlying the left hemithorax. Cervical spine fixation hardware. IMPRESSION: Endotracheal tube terminates 3 cm above the carina. Enteric tube courses below the diaphragm. Electronically Signed   By: Julian Hy M.D.   On: 07/29/2018 00:53    Cardiac Studies   Cath results from 5/19 reviewed  Patient Profile     77 y.o. male with cardiac arrest, CAD  Assessment & Plan    1) Rhythm has been stable.  Unclear whether inciting event for arrest was cardiac or respiratory.  2) BP elevated by A-line.  Will restart low dose hydralazine.    Continue supportive care.     For questions or updates, please contact [place] Please consult [email or web address] for contact info under        Signed, Larae Grooms, MD  07/30/2018, 10:32 AM

## 2018-07-31 ENCOUNTER — Inpatient Hospital Stay (HOSPITAL_COMMUNITY): Payer: Medicare Other

## 2018-07-31 ENCOUNTER — Ambulatory Visit: Payer: Medicare Other | Admitting: Nurse Practitioner

## 2018-07-31 DIAGNOSIS — R4182 Altered mental status, unspecified: Secondary | ICD-10-CM

## 2018-07-31 LAB — GLUCOSE, CAPILLARY
GLUCOSE-CAPILLARY: 176 mg/dL — AB (ref 70–99)
GLUCOSE-CAPILLARY: 147 mg/dL — AB (ref 70–99)
GLUCOSE-CAPILLARY: 169 mg/dL — AB (ref 70–99)
GLUCOSE-CAPILLARY: 177 mg/dL — AB (ref 70–99)
GLUCOSE-CAPILLARY: 220 mg/dL — AB (ref 70–99)
GLUCOSE-CAPILLARY: 219 mg/dL — AB (ref 70–99)
GLUCOSE-CAPILLARY: 163 mg/dL — AB (ref 70–99)
GLUCOSE-CAPILLARY: 155 mg/dL — AB (ref 70–99)
GLUCOSE-CAPILLARY: 169 mg/dL — AB (ref 70–99)
GLUCOSE-CAPILLARY: 106 mg/dL — AB (ref 70–99)
GLUCOSE-CAPILLARY: 220 mg/dL — AB (ref 70–99)
Glucose-Capillary: 115 mg/dL — ABNORMAL HIGH (ref 70–99)
Glucose-Capillary: 132 mg/dL — ABNORMAL HIGH (ref 70–99)
Glucose-Capillary: 157 mg/dL — ABNORMAL HIGH (ref 70–99)
Glucose-Capillary: 164 mg/dL — ABNORMAL HIGH (ref 70–99)
Glucose-Capillary: 164 mg/dL — ABNORMAL HIGH (ref 70–99)
Glucose-Capillary: 170 mg/dL — ABNORMAL HIGH (ref 70–99)
Glucose-Capillary: 173 mg/dL — ABNORMAL HIGH (ref 70–99)
Glucose-Capillary: 175 mg/dL — ABNORMAL HIGH (ref 70–99)
Glucose-Capillary: 183 mg/dL — ABNORMAL HIGH (ref 70–99)
Glucose-Capillary: 201 mg/dL — ABNORMAL HIGH (ref 70–99)
Glucose-Capillary: 203 mg/dL — ABNORMAL HIGH (ref 70–99)

## 2018-07-31 LAB — BASIC METABOLIC PANEL
ANION GAP: 5 (ref 5–15)
BUN: 38 mg/dL — AB (ref 8–23)
CHLORIDE: 111 mmol/L (ref 98–111)
CO2: 33 mmol/L — ABNORMAL HIGH (ref 22–32)
Calcium: 7.9 mg/dL — ABNORMAL LOW (ref 8.9–10.3)
Creatinine, Ser: 1.16 mg/dL (ref 0.61–1.24)
GFR calc Af Amer: >60 mL/min (ref >60)
GFR, EST NON AFRICAN AMERICAN: 59 mL/min — AB (ref >60)
Glucose, Bld: 151 mg/dL — ABNORMAL HIGH (ref 70–99)
POTASSIUM: 3.8 mmol/L (ref 3.5–5.1)
SODIUM: 149 mmol/L — AB (ref 135–145)

## 2018-07-31 LAB — CBC
HCT: 39.6 % (ref 39.0–52.0)
HEMOGLOBIN: 12.1 g/dL — AB (ref 13.0–17.0)
MCH: 28.3 pg (ref 26.0–34.0)
MCHC: 30.6 g/dL (ref 30.0–36.0)
MCV: 92.5 fL (ref 78.0–100.0)
Platelets: 247 10*3/uL (ref 150–400)
RBC: 4.28 MIL/uL (ref 4.22–5.81)
RDW: 13.9 % (ref 11.5–15.5)
WBC: 16 10*3/uL — AB (ref 4.0–10.5)

## 2018-07-31 LAB — MAGNESIUM: MAGNESIUM: 2.4 mg/dL (ref 1.7–2.4)

## 2018-07-31 LAB — PROCALCITONIN: Procalcitonin: 0.18 ng/mL

## 2018-07-31 LAB — PHOSPHORUS: Phosphorus: 2.8 mg/dL (ref 2.5–4.6)

## 2018-07-31 MED ORDER — INSULIN ASPART 100 UNIT/ML ~~LOC~~ SOLN: 3.0000 [IU] | SUBCUTANEOUS | Status: DC

## 2018-07-31 MED ORDER — FUROSEMIDE 10 MG/ML IJ SOLN
40.0000 mg | Freq: Three times a day (TID) | INTRAMUSCULAR | Status: AC
  Administered 2018-07-31 (×2): 40 mg via INTRAVENOUS
  Filled 2018-07-31 (×2): qty 4

## 2018-07-31 MED ORDER — DEXTROSE 10 % IV SOLN: INTRAVENOUS | Status: DC | PRN

## 2018-07-31 MED ORDER — INSULIN DETEMIR 100 UNIT/ML ~~LOC~~ SOLN
12.0000 [IU] | Freq: Two times a day (BID) | SUBCUTANEOUS | Status: DC
  Administered 2018-07-31: 12 [IU] via SUBCUTANEOUS
  Filled 2018-07-31: qty 0.12

## 2018-07-31 MED ORDER — INSULIN ASPART 100 UNIT/ML ~~LOC~~ SOLN: 4.0000 [IU] | SUBCUTANEOUS | Status: DC

## 2018-07-31 MED ORDER — FREE WATER
300.0000 mL | Status: DC
  Administered 2018-07-31 – 2018-08-02 (×12): 300 mL

## 2018-07-31 MED ORDER — POTASSIUM CHLORIDE 20 MEQ/15ML (10%) PO SOLN
40.0000 meq | Freq: Two times a day (BID) | ORAL | Status: AC
  Administered 2018-07-31 (×2): 40 meq via ORAL
  Filled 2018-07-31 (×2): qty 30

## 2018-07-31 NOTE — Procedures (Addendum)
Date of recording9/29/2019  Referring physician Rory Percy  Reason for the study Altered mental status  Technical Digital EEG recording using 10-20 international electrode system  Description of the recording Posterior dominant rhythm is4-6Hz  symmetrical  EEG comprises generalized delta and theta slowing with very mild reactivity Pulse artifact Epileptiform features were not seen during this recording  Impression The EEG isabnormal and findings are consistent with moderate to severe generalized cerbral dysfunction, epileptiform features were not seen during this recording

## 2018-07-31 NOTE — Progress Notes (Addendum)
NAME:  Edward [name], MRN:  [id number], DOB:  10-26-1941, LOS: 50 ADMISSION DATE:  07/13/2018, CONSULTATION DATE: 9/19 REFERRING MD:  Roylene Reason, CHIEF COMPLAINT:  Cardiac arrest    Brief History   77 year old patient with history of diabetes with history of difficult glycemic control and cervical stenosis resulting in cord compression underwent anterior cervical disc fusion of cervical spine on September 16.  On 9/19 began to notice difficulty swallowing, not able to even swallow his own saliva.  Had been ordered Decadron but this was not given.  He was found by nursing staff unresponsive and in asystole.  He underwent 1 round of ACLS with successful return of spontaneous circulation.  Tx to ICU for hypothermia protocol.   Significant Hospital Events   9/16: Anterior cervical disc fusion 7/85: Asystolic arrest.  Estimated time to return of spontaneous circulation approximately 3 minutes.  Unresponsive not following commands postarrest so hypothermia protocol initiated.  Goal temperature met at: 1030 pm  9/20: Hemodynamically stable on low-dose norepinephrine 9/21: completed rewarming. Paralytic and sedation discontinued. 9/22: paralysis/sedation and vasopressor therapy discontinued yesterday. Restlessness developed last night. IV fentanyl started. Reqired iv labetalol to treat HTN. Has a h/o HTN. 9/24>> Developed fever Vanc/Zosyn started 9/25 weaned off sedation and pressors.  Weaning but copious secretions. 9/26>> Extubated to 4 L [place] 9/27>> Failed swallow, weaned to RA 9/29>> re-intubated 9/30>>>Spoke with family extensively, brother is POA, remains hypernatremic and slowly weaning.  No trach/peg.  One way extubation when optimized. 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 No hemorrhage, mass effect, or herniation 9/23 MRI cervical spine: No acute osseous or cord signal abnormality identified. Interval C3-C7 anterior cervical fusion and discectomy. Small prevertebral fluid collection extending into the right anterior neck compatible with postsurgical changes. No epidural collection. Mildly improved patency of the cervical spinal canal. Stable residual multilevel foraminal stenosis from uncovertebral and facet Hypertrophy. 9/23 MRI thoracic spine Mild thoracic spine spondylosis. No high-grade foraminal or canal Stenosis 9/19: CT brain noncontrasted: Negative CT cervical spine 9/19: Status post C3-C7 ACDF with expected postoperative changes.  No fracture or malalignment 2D echo 9/20 >>>Left ventricle: The cavity size was normal. There was severe   concentric hypertrophy. Systolic function was vigorous. The   estimated ejection fraction was in the range of 65% to 70%. Wall   motion was normal; there were no regional wall motion   abnormalities. Doppler parameters are consistent with abnormal   left ventricular relaxation (grade 1 diastolic dysfunction).   Doppler parameters are consistent with elevated mean left atrial   filling pressure. - Mitral valve: Valve area by pressure half-time: 2.04 cm^2. - Left atrium: The atrium was mildly dilated. 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 rate 15, height '5\' 8"'  (1.727 m), weight 113.6 kg, SpO2 100 %.    Vent Mode: PSV;CPAP FiO2 (%):  [40 %] 40 % Set Rate:  [20 bmp] 20 bmp PEEP:  [5 cmH20] 5 cmH20 Pressure Support:  [12 cmH20] 12 cmH20 Plateau Pressure:  [14 cmH20-16 cmH20] 16 cmH20   Intake/Output Summary (Last 24 hours) at 07/31/2018 0924 Last data filed at 07/31/2018 0800 Gross per 24 hour  Intake 3416.72 ml  Output 4675 ml  Net -1258.28 ml   Filed Weights   07/29/18 0400 07/30/18 0409 07/31/18 0300  Weight: 112 kg 115.2 kg 113.6 kg   Examination: General: Asleep, weaning on 12/5 HEENT: [place]/AT, PERRL, EOM-I and MMM Neuro: Sleepy this am, MAE x 4, per nursing following commands CV: RRR, Nl S1/S2 and -M/R/G, NSR per monitor PULM: Bilateral chest excursion, CTA bilaterally, diminished per bases GI: Soft, NT, ND and +BS, Obese Extremities: 1+ edema and -tenderness, no obvious deformities Skin: Intact, warm and dry, no lesions or rash  Resolved Hospital Problem list    Metabolic encephalopathy  Assessment & Plan:  Status post asystolic cardiac arrest.  Presumed hypoxia mediated given prior OSA and report of inadequate airway protection pre-arrest. H/O coronary artery disease and CABG back in 2019 with chronic diastolic heart failure HTN  Net negative 8.9 L since admission Plan Tele monitoring Lasix 40 BID x 2 doses 07/2018 CXR prn BNP prn   Ventilator dependence status post cardiopulmonary arrest>> resolved H/o OSA  Plan Failed Swallow 9/27 Re-intubated 9/29 Weaning 07/31/2018 on 12/5 Plan: Titrate O2 for sat of 88-92% Aggressive Pulmonary toilet  OOB to chair Continue  PS trials/ diuresis one way  extubation once  optimized Once extubated, full DNR  ID Aspiration pneumonia CXR resolving ABX discontinued 9/26 High risk aspiration as failed swallow    Re-intubated 9/30 Remains afebrile No infiltrate per CXR Plan: PCT 10/2 Trend CBC  CXR prn Continue to follow leukocytosis and fever.     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 Awake and alert  Plan: Ongoing Monitoring  mental status Na , Free water Lasix Continue D5W at 50 ml/hr x additional 24 hours as additional lasix dosing   Chronic kidney disease stage III Htpernatremia Plan: Trend BMET Monitor UO Avoid nephrotoxic medications Trend BMET Replace electrolytes as indicated Free water and D5W as above  GI Failed Swallow 9/27 Weak cough>> high risk aspiration Weak and deconditioned  Plan: Place Cor Track TF per nutrition Speech therapy for continued evaluation and support  Deconditioning Plan: PT/OT Goals passive ROM while intubated Mobilize with extubation  Lab Results  Component Value Date   CREATININE 1.16 07/31/2018   CREATININE 1.21 07/30/2018   CREATININE 1.44 (H) 07/29/2018   CREATININE 1.33 (H) 12/31/2015    Intake/Output Summary (Last 24 hours) at 07/31/2018 0924 Last data filed at 07/31/2018 0800 Gross per 24 hour  Intake 3416.72 ml  Output 4675 ml  Net -1258.28 ml     Diabetes   Plan  Transition to sliding scale insulin per protocol Note he is on steroids Insulin drip>> transitioning off 10/1  Status post anterior cervical disc fusion 9/16 Plan Currently on Decadron 2 mg every 8 hours Wean steroids.   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    Diet: enteral feeding Pain/Anxiety/Delirium protocol (if indicated): PAD protocol VAP protocol (if indicated)  ordered 9/20 DVT prophylaxis: low molecular weight heparin  GI prophylaxis: H2 blockade Hyperglycemia protocol: Hyperglycemia protocol Mobility: Bedrest>> Mobilize per PT/OT Code Status: Full code Family Communication: No family at bedside 10/1>> they were updated in full 9/30  Labs   CBC: Recent Labs  Lab 07/25/18 0339 07/28/18 0250 07/29/18 0033 07/31/18 0349  WBC 20.9* 14.6* 11.0* 16.0*  HGB 12.5* 13.4 12.9* 12.1*  HCT 39.5 42.9 40.2 39.6  MCV 89.6 90.5 90.7 92.5  PLT 242 272 289 951   Basic Metabolic Panel: Recent Labs  Lab 07/25/18 0339 07/26/18 0414 07/28/18 0250 07/29/18 0033 07/29/18 0330 07/30/18 0905 07/31/18 0349  NA 146* 145 150* 150* 151* 150* 149*  K 4.2 4.0 3.9 5.1 4.1 3.9 3.8  CL 108 107 112* 112* 112* 113* 111  CO2 '28 31 29 25 28 30 ' 33*  GLUCOSE 227* 254* 82 260* 296* 269* 151*  BUN 37* 38* 37* 42* 45* 38* 38*  CREATININE 1.08 1.16 1.33* 1.48* 1.44* 1.21 1.16  CALCIUM 8.5* 8.5* 8.8* 9.0 8.5* 7.5* 7.9*  MG 2.5* 2.4  --  2.5*  --  2.5* 2.4  PHOS  --  3.5  --  7.0*  --   --  2.8   GFR: Estimated Creatinine Clearance: 66.3 mL/min (by C-G formula based on SCr of 1.16 mg/dL). Recent Labs  Lab 07/25/18 0339 07/28/18 0250 07/29/18 0033 07/29/18 0323 07/31/18 0349  PROCALCITON 0.60  --   --   --   --   WBC 20.9* 14.6* 11.0*  --  16.0*  LATICACIDVEN  --   --  3.8* 1.3  --    Liver Function Tests: Recent Labs  Lab 07/28/18 0250 07/29/18 0330  AST 62* 549*  ALT 143* 528*  ALKPHOS 117 128*  BILITOT 0.8 0.6  PROT 6.7 5.4*  ALBUMIN 2.8* 2.3*   No results for input(s): LIPASE, AMYLASE in the last 168 hours. No results for input(s): AMMONIA in the last 168 hours. ABG    Component Value Date/Time   PHART 7.450 07/31/2018 0410   PCO2ART 51.3 (H) 07/31/2018 0410   PO2ART 213 (H) 07/31/2018 0410   HCO3 35.0 (H) 07/31/2018 0410   TCO2 34 (H) 07/29/2018 0056   ACIDBASEDEF 2.0 11/09/2016 2003   O2SAT 99.3 07/31/2018 0410    Coagulation  Profile: No results for input(s): INR, PROTIME in the last 168 hours. Cardiac Enzymes: Recent Labs  Lab 07/29/18 0033 07/29/18 0330 07/29/18 1254  TROPONINI 0.07* 0.09* 0.07*   HbA1C: Hgb A1c MFr Bld  Date/Time Value Ref Range Status  06/19/2018 10:56 AM 9.5 (H) 4.6 - 6.5 % Final    Comment:    Glycemic Control Guidelines for People with Diabetes:Non Diabetic:  <6%Goal of Therapy: <7%Additional Action Suggested:  >8%   03/13/2018 10:53 AM 10.2 (H) 4.6 - 6.5 % Final    Comment:    Glycemic Control Guidelines for People with Diabetes:Non Diabetic:  <6%Goal of Therapy: <7%Additional Action Suggested:  >8%    CBG: Recent Labs  Lab 07/31/18 0448 07/31/18 0555 07/31/18 0700 07/31/18 0748 07/31/18 0909  GLUCAP 115* 132* 157* [place], AGACNP-BC [place]. Pager: [phone]   07/31/2018 9:24 AM  Attending  Note:  77 year old male s/p cardiac arrest with acute encephalopathy that is likely multifactorial with anoxia and hypernatremia.  On exam, slow to respond but following some commands.  I reviewed CXR myself, ETT is in good position.  Discussed with PCCM-NP.  Acute respiratory failure:  - Begin PS trials but no extubation given mental status and high pressure support needs  - Titrate O2 for sat of 88-92%.  Hypernatremia:  - Increase free water 300 ml q4 hr  - D5W at 50 ml/hr  - BMET in AM  - K replacement  Acute encephalopathy:  - Correct Na  - Neurology following and optimistic   GOC:  - One way extubation when ready  - Family does not wish for trach/peg  The patient is critically ill with multiple organ systems failure and requires high complexity decision making for assessment and support, frequent evaluation and titration of therapies, application of advanced monitoring technologies and extensive interpretation of multiple databases.   Critical Care Time devoted to patient care services described in this note  is  34  Minutes. This time reflects time of care of this signee Dr Jennet Maduro. This critical care time does not reflect procedure time, or teaching time or supervisory time of PA/NP/Med student/Med Resident etc but could involve care discussion time.  Rush Farmer, M.D. [facility] Pulmonary/Critical Care Medicine. Pager: [phone]. After hours pager: [phone].

## 2018-07-31 NOTE — Evaluation (Signed)
Occupational Therapy Evaluation Patient Details Name: Edward [name] MRN: [id number] DOB: 1941-05-30 Today's Date: 07/31/2018    History of Present Illness pt is a 77 y/o male admitted with neck pain and arm weakness s/p C3-7 aCDF on 9/16. Pt with aystole on 9/19, VDRF 9/19-9/26. PMhx: CAD, CKD, DM2, CABG, HTN, He sustained another cardiac arrest/PEA 9/29, received epi x3 and then developed VF requiring shock, total downtime documented as 7 minutes but head CT concerning of loss of gray and white matter.  He also had clonic or myoclonic activity. Inutbated   Clinical Impression   This 77 yo male admitted with above and complications as per above seen today at bed level after speaking with RN Thayer Headings) who spoke with critical care NP Judson Roch) that we could start bed level activities with pt and RN would work on chair position in bed. Pt awake and following all commands this session. Moving all 4 extremities (but weak), communicating the best he can with endotrach tube (but was able to get his needs across to me between pointing and me guessing. He will benefit from acute OT with follow up OT at SNF to work on increasing strength and mobility (once cleared for this).    Follow Up Recommendations  SNF;LTACH;Supervision/Assistance - 24 hour    Equipment Recommendations  None recommended by OT       Precautions / Restrictions Precautions Precautions: Fall;Cervical Restrictions Weight Bearing Restrictions: No      Mobility Bed Mobility               General bed mobility comments: pt did shift his hips spontaneously in the bed while supine         ADL either performed or assessed with clinical judgement   ADL Overall ADL's : Needs assistance/impaired Eating/Feeding: NPO   Grooming: Wash/dry face;Maximal assistance;Bed level Grooming Details (indicate cue type and reason): hand over hand with RUE Upper Body Bathing: Maximal assistance;Bed level   Lower Body Bathing: Total  assistance;Bed level   Upper Body Dressing : Total assistance;Bed level   Lower Body Dressing: Total assistance;Bed level                       Vision Baseline Vision/History: Wears glasses Wears Glasses: Reading only Additional Comments: Turning eyes and head left and right when I switched sides of bed, not formally tested            Pertinent Vitals/Pain Pain Assessment: Faces Faces Pain Scale: Hurts little more Pain Location: upper lip Pain Descriptors / Indicators: Grimacing;Pressure Pain Intervention(s): Monitored during session;Other (comment)(Made RN aware of pressure on upper lip from endotrachial tube stablizer)     Hand Dominance Right   Extremity/Trunk Assessment Upper Extremity Assessment Upper Extremity Assessment: RUE deficits/detail;LUE deficits/detail RUE Deficits / Details: 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 (~20 degrees AAROM), Full AAROM of all other joints, able to give me a low "5" and show me thumbs up           Communication Communication Communication: Other (comment)(endo-tracheal tube)   Cognition Arousal/Alertness: Awake/alert Behavior During Therapy: (was raising his eye brows occassionally in trying to talk/mouth to me) Overall  Cognitive Status: (Followed all commands for me today)                                        Exercises Other Exercises Other Exercises: In supine pt performed 10 reps each of AAROM shoulder flexion/extension, shoulder abduction/adduction, elbow flexio/extension, AROM of digits, AROM ankle pumps, AAROM knee flexion/extension, AAROM hip abduction/adduction        Home Living Family/patient expects to be discharged to::  Skilled nursing facility                                        Prior Functioning/Environment          Comments: Enjoys church        OT Problem List: Decreased strength;Decreased range of motion;Impaired balance (sitting and/or standing);Impaired UE functional use;Decreased coordination      OT Treatment/Interventions: Self-care/ADL training;Balance training;Patient/family education;Therapeutic exercise;Therapeutic activities    OT Goals(Current goals can be found in the care plan section) Acute Rehab OT Goals Patient Stated Goal: shook head yes to working with me OT Goal Formulation: With patient Time For Goal Achievement: 08/14/18 Potential to Achieve Goals: Good  OT Frequency: Min 2X/week   Barriers to D/C: Decreased caregiver support             AM-PAC PT "6 Clicks" Daily Activity     Outcome Measure Help from another person eating meals?: Total Help from another person taking care of personal grooming?: A Lot Help from another person toileting, which includes using toliet, bedpan, or urinal?: Total Help from another person bathing (including washing, rinsing, drying)?: Total Help from another person to put on and taking off regular upper body clothing?: Total Help from another person to put on and taking off regular lower body clothing?: Total 6 Click Score: 7   End of Session Nurse Communication: (followed all commands, able to point to tell me his needs)  Activity Tolerance: Patient tolerated treatment well Patient left: in bed;with nursing/sitter in room  OT Visit Diagnosis: Other abnormalities of gait and mobility (R26.89);Muscle weakness (generalized) (M62.81)                Time: 5053-9767 OT Time Calculation (min): 24 min Charges:  OT General Charges $OT Visit: 1 Visit OT Evaluation $OT Re-eval: 1 Re-eval OT Treatments $Therapeutic Exercise: 8-22 mins  [place], OTR/L Acute [employer] Pager [phone] Office  [phone]

## 2018-07-31 NOTE — Progress Notes (Signed)
PT Cancellation Note  Patient Details Name: MICK TANGUMA MRN: [id number] DOB: [birth year]   Cancelled Treatment:    Reason Eval/Treat Not Completed: Medical issues which prohibited therapy(pt reintubated)   Emelee Rodocker B [address] 07/31/2018, 6:59 AM  Elwyn Reach, PT Acute Rehabilitation Services Pager: [phone] Office: [phone]

## 2018-08-01 ENCOUNTER — Inpatient Hospital Stay (HOSPITAL_COMMUNITY): Payer: Medicare Other

## 2018-08-01 LAB — BLOOD GAS, ARTERIAL
ACID-BASE EXCESS: 10.5 mmol/L — AB (ref 0.0–2.0)
BICARBONATE: 35 mmol/L — AB (ref 20.0–28.0)
DRAWN BY: 40415
FIO2: 40
LHR: 20 {breaths}/min
O2 SAT: 99.3 %
PCO2 ART: 51.3 mmHg — AB (ref 32.0–48.0)
PEEP/CPAP: 5 cmH2O
PH ART: 7.45 (ref 7.350–7.450)
PRESSURE CONTROL: 12 cmH2O
Patient temperature: 99.2
pO2, Arterial: 213 mmHg — ABNORMAL HIGH (ref 83.0–108.0)

## 2018-08-01 LAB — CULTURE, RESPIRATORY W GRAM STAIN: Culture: NORMAL

## 2018-08-01 LAB — CBC
HCT: 28.9 % — ABNORMAL LOW (ref 39.0–52.0)
HCT: 38.4 % — ABNORMAL LOW (ref 39.0–52.0)
HEMOGLOBIN: 11.6 g/dL — AB (ref 13.0–17.0)
Hemoglobin: 8.6 g/dL — ABNORMAL LOW (ref 13.0–17.0)
MCH: 28.2 pg (ref 26.0–34.0)
MCH: 28.1 pg (ref 26.0–34.0)
MCHC: 29.8 g/dL — AB (ref 30.0–36.0)
MCHC: 30.2 g/dL (ref 30.0–36.0)
MCV: 94.8 fL (ref 78.0–100.0)
MCV: 93 fL (ref 78.0–100.0)
PLATELETS: 203 10*3/uL (ref 150–400)
PLATELETS: 272 10*3/uL (ref 150–400)
RBC: 3.05 MIL/uL — AB (ref 4.22–5.81)
RBC: 4.13 MIL/uL — AB (ref 4.22–5.81)
RDW: 13.8 % (ref 11.5–15.5)
RDW: 13.7 % (ref 11.5–15.5)
WBC: 11.3 10*3/uL — ABNORMAL HIGH (ref 4.0–10.5)
WBC: 14.7 10*3/uL — ABNORMAL HIGH (ref 4.0–10.5)

## 2018-08-01 LAB — GLUCOSE, CAPILLARY
GLUCOSE-CAPILLARY: 194 mg/dL — AB (ref 70–99)
GLUCOSE-CAPILLARY: 168 mg/dL — AB (ref 70–99)
GLUCOSE-CAPILLARY: 174 mg/dL — AB (ref 70–99)
GLUCOSE-CAPILLARY: 147 mg/dL — AB (ref 70–99)
GLUCOSE-CAPILLARY: 164 mg/dL — AB (ref 70–99)
GLUCOSE-CAPILLARY: 272 mg/dL — AB (ref 70–99)
Glucose-Capillary: 123 mg/dL — ABNORMAL HIGH (ref 70–99)
Glucose-Capillary: 149 mg/dL — ABNORMAL HIGH (ref 70–99)
Glucose-Capillary: 149 mg/dL — ABNORMAL HIGH (ref 70–99)
Glucose-Capillary: 158 mg/dL — ABNORMAL HIGH (ref 70–99)
Glucose-Capillary: 165 mg/dL — ABNORMAL HIGH (ref 70–99)
Glucose-Capillary: 172 mg/dL — ABNORMAL HIGH (ref 70–99)
Glucose-Capillary: 201 mg/dL — ABNORMAL HIGH (ref 70–99)
Glucose-Capillary: 219 mg/dL — ABNORMAL HIGH (ref 70–99)
Glucose-Capillary: 277 mg/dL — ABNORMAL HIGH (ref 70–99)

## 2018-08-01 LAB — BASIC METABOLIC PANEL
Anion gap: 0 — ABNORMAL LOW (ref 5–15)
BUN: 30 mg/dL — ABNORMAL HIGH (ref 8–23)
CO2: 25 mmol/L (ref 22–32)
Calcium: 5.8 mg/dL — CL (ref 8.9–10.3)
Chloride: 119 mmol/L — ABNORMAL HIGH (ref 98–111)
Creatinine, Ser: 0.9 mg/dL (ref 0.61–1.24)
GFR calc Af Amer: >60 mL/min (ref >60)
Glucose, Bld: 150 mg/dL — ABNORMAL HIGH (ref 70–99)
POTASSIUM: 3.2 mmol/L — AB (ref 3.5–5.1)
Sodium: 144 mmol/L (ref 135–145)

## 2018-08-01 LAB — CULTURE, RESPIRATORY

## 2018-08-01 MED ORDER — FUROSEMIDE 10 MG/ML IJ SOLN
40.0000 mg | Freq: Three times a day (TID) | INTRAMUSCULAR | Status: AC
  Administered 2018-08-01 (×2): 40 mg via INTRAVENOUS
  Filled 2018-08-01 (×2): qty 4

## 2018-08-01 MED ORDER — POTASSIUM CHLORIDE 10 MEQ/100ML IV SOLN
10.0000 meq | INTRAVENOUS | Status: AC
  Administered 2018-08-01 (×5): 10 meq via INTRAVENOUS
  Filled 2018-08-01 (×5): qty 100

## 2018-08-01 MED ORDER — SODIUM CHLORIDE 0.9 % IV SOLN
1.0000 g | Freq: Once | INTRAVENOUS | Status: AC
  Administered 2018-08-01: 1 g via INTRAVENOUS
  Filled 2018-08-01: qty 10

## 2018-08-01 MED ORDER — INSULIN ASPART 100 UNIT/ML ~~LOC~~ SOLN
0.0000 [IU] | SUBCUTANEOUS | Status: DC
  Administered 2018-08-01: 4 [IU] via SUBCUTANEOUS
  Administered 2018-08-01 (×2): 11 [IU] via SUBCUTANEOUS
  Administered 2018-08-01: 7 [IU] via SUBCUTANEOUS
  Administered 2018-08-02 (×5): 4 [IU] via SUBCUTANEOUS
  Administered 2018-08-03: 7 [IU] via SUBCUTANEOUS
  Administered 2018-08-03 (×3): 4 [IU] via SUBCUTANEOUS
  Administered 2018-08-03: 7 [IU] via SUBCUTANEOUS
  Administered 2018-08-04: 4 [IU] via SUBCUTANEOUS
  Administered 2018-08-04: 3 [IU] via SUBCUTANEOUS
  Administered 2018-08-04: 4 [IU] via SUBCUTANEOUS
  Administered 2018-08-04: 3 [IU] via SUBCUTANEOUS
  Administered 2018-08-04: 4 [IU] via SUBCUTANEOUS
  Administered 2018-08-04 – 2018-08-05 (×3): 3 [IU] via SUBCUTANEOUS
  Administered 2018-08-05: 4 [IU] via SUBCUTANEOUS
  Administered 2018-08-05: 3 [IU] via SUBCUTANEOUS
  Administered 2018-08-05: 7 [IU] via SUBCUTANEOUS
  Administered 2018-08-06 – 2018-08-07 (×8): 4 [IU] via SUBCUTANEOUS
  Administered 2018-08-07: 3 [IU] via SUBCUTANEOUS
  Administered 2018-08-07: 4 [IU] via SUBCUTANEOUS

## 2018-08-01 MED ORDER — INSULIN DETEMIR 100 UNIT/ML ~~LOC~~ SOLN
25.0000 [IU] | Freq: Two times a day (BID) | SUBCUTANEOUS | Status: DC
  Administered 2018-08-01 – 2018-08-02 (×3): 25 [IU] via SUBCUTANEOUS
  Filled 2018-08-01 (×5): qty 0.25

## 2018-08-01 NOTE — Progress Notes (Addendum)
NAME:  Edward [name], MRN:  [id number], DOB:  1941/06/04, LOS: 80 ADMISSION DATE:  07/13/2018, CONSULTATION DATE: 9/19 REFERRING MD:  Roylene Reason, CHIEF COMPLAINT:  Cardiac arrest    Brief History   77 year old patient with history of diabetes with history of difficult glycemic control and cervical stenosis resulting in cord compression underwent anterior cervical disc fusion of cervical spine on September 16.  On 9/19 began to notice difficulty swallowing, not able to even swallow his own saliva.  Had been ordered Decadron but this was not given.  He was found by nursing staff unresponsive and in asystole.  He underwent 1 round of ACLS with successful return of spontaneous circulation.  Tx to ICU for hypothermia protocol.   Significant Hospital Events   9/16: Anterior cervical disc fusion 7/82: Asystolic arrest.  Estimated time to return of spontaneous circulation approximately 3 minutes.  Unresponsive not following commands postarrest so hypothermia protocol initiated.  Goal temperature met at: 1030 pm  9/20: Hemodynamically stable on low-dose norepinephrine 9/21: completed rewarming. Paralytic and sedation discontinued. 9/22: paralysis/sedation and vasopressor therapy discontinued yesterday. Restlessness developed last night. IV fentanyl started. Reqired iv labetalol to treat HTN. Has a h/o HTN. 9/24>> Developed fever Vanc/Zosyn started 9/25 weaned off sedation and pressors.  Weaning but copious secretions. 9/26>> Extubated to 4 L [place] 9/27>> Failed swallow, weaned to RA 9/29>> re-intubated 9/30>>>Spoke with family extensively, brother is POA, remains hypernatremic and slowly weaning.  No trach/peg.  One way extubation when optimized. 10/1>> Weaning on 12/5, transitioning off insulin gtt  08/01/2018 now off insulin drip.  Weaning well.  Awake and alert.  Ready for extubation  Consults: date of consult/date signed off & final recs:  Neuro 9/22>>> Cardiology 9/20>>>  Procedures (surgical  and bedside):  9/16: Anterior cervical disc fusion by neurosurgery 9/19: Oral endotracheal tube>> 9/26 9/19: Left internal jugular vein triple-lumen catheter>>> 9/27  Significant Diagnostic Tests:  9/23 MRI Head Caudate and lentiform nuclei increased T2 signal and heterogeneous reduced diffusion compatible with hypoxic ischemic injury of the brain in the setting of cardiac arrest. No hemorrhage, mass effect, or herniation 9/23 MRI cervical spine: No acute osseous or cord signal abnormality identified. Interval C3-C7 anterior cervical fusion and discectomy. Small prevertebral fluid collection extending into the right anterior neck compatible with postsurgical changes. No epidural collection. Mildly improved patency of the cervical spinal canal. Stable residual multilevel foraminal stenosis from uncovertebral and facet Hypertrophy. 9/23 MRI thoracic spine Mild thoracic spine spondylosis. No high-grade foraminal or canal Stenosis 9/19: CT brain noncontrasted: Negative CT cervical spine 9/19: Status post C3-C7 ACDF with expected postoperative changes.  No fracture or malalignment 2D echo 9/20 >>>Left ventricle: The cavity size was normal. There was severe   concentric hypertrophy. Systolic function was vigorous. The   estimated ejection fraction was in the range of 65% to 70%. Wall   motion was normal; there were no regional wall motion   abnormalities. Doppler parameters are consistent with abnormal   left ventricular relaxation (grade 1 diastolic dysfunction).   Doppler parameters are consistent with elevated mean left atrial   filling pressure. - Mitral valve: Valve area by pressure half-time: 2.04 cm^2. - Left atrium: The atrium was mildly dilated. 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 rate 14, height '5\' 8"'  (1.727 m), weight 113.2 kg, SpO2 100 %.    Vent Mode: CPAP;PSV FiO2 (%):  [40 %] 40 % Set Rate:  [20 bmp] 20 bmp PEEP:  [5 cmH20] 5 cmH20 Pressure Support:  [12 cmH20] 12 cmH20 Plateau Pressure:  [14 cmH20] 14 cmH20   Intake/Output Summary (Last 24 hours) at 08/01/2018 1194 Last data filed at 08/01/2018 0700 Gross per 24 hour  Intake 2288.94 ml  Output 3030 ml  Net -741.06 ml   Filed Weights   07/30/18 0409 07/31/18 0300 08/01/18 0402  Weight: 115.2 kg 113.6 kg 113.2 kg   Examination: General: Well-nourished well-developed male no acute distress on ventilator awake alert HEENT: Endotracheal tube in place, core tract in right nares Neuro: Awake alert follows commands CV: Sounds are regular PULM: even/non-labored, lungs bilaterally diminished in the bases RD:EYCX, non-tender, bsx4 active  Extremities: warm/dry, negative edema  Skin: no rashes or lesions   Resolved Hospital Problem list    Metabolic encephalopathy  Assessment & Plan:  Status post asystolic cardiac arrest.  Presumed hypoxia mediated given prior OSA and report of inadequate airway protection pre-arrest. 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 Awake and alert  Plan: 08/01/2018 awake alert Continue to monitor   Chronic kidney disease stage III Htpernatremia Recent Labs  Lab 07/30/18 0905 07/31/18 0349 08/01/18 0353  NA 150* 149* 144   Lab Results  Component Value Date   CREATININE 0.90 08/01/2018   CREATININE 1.16 07/31/2018   CREATININE 1.21 07/30/2018   CREATININE 1.33 (H) 12/31/2015    Plan: Continue free water Monitor electrolytes Creatinine tolerates diuresis we will continue   GI Failed Swallow 9/27 Weak cough>> high risk aspiration Weak and deconditioned  Plan: Tube feeding as tolerated Currently is core tract in place Repeat swallow eval near future if he survives  Deconditioning Plan: Mobilizes successfully extubated PT and OT if extubated  Lab Results  Component Value Date   CREATININE 0.90 08/01/2018   CREATININE 1.16 07/31/2018   CREATININE 1.21 07/30/2018   CREATININE 1.33 (H) 12/31/2015    Intake/Output Summary (Last 24 hours) at 08/01/2018 4481 Last data filed at 08/01/2018 0700 Gross per 24 hour  Intake 2288.94 ml  Output 3030 ml  Net -741.06 ml     Diabetes   Plan  He is now on sliding scale insulin Transition to sliding scale insulin   Status post anterior cervical disc fusion 9/16 Plan Currently on steroids Monitor neurological status  Disposition / Summary of Today's Plan 08/01/18   He is a DNR Weaning from ventilator One-way extubation near future    Diet: enteral feeding Pain/Anxiety/Delirium protocol (if indicated): PAD protocol VAP protocol (if indicated)  ordered 9/20 DVT prophylaxis: low molecular weight heparin  GI prophylaxis: H2 blockade Hyperglycemia protocol: Hyperglycemia protocol Mobility: Bedrest>> Mobilize per PT/OT Code Status: DO NOT  RESUSCITATE Family Communication: No family bedside 08/01/2018  Labs   CBC: Recent Labs  Lab 07/28/18 0250 07/29/18 0033 07/31/18 0349 08/01/18 0353  WBC 14.6* 11.0* 16.0* 11.3*  HGB 13.4 12.9* 12.1* 8.6*  HCT 42.9 40.2 39.6 28.9*  MCV 90.5 90.7 92.5 94.8  PLT 272 289 247 270   Basic Metabolic Panel: Recent Labs  Lab 07/26/18 0414  07/29/18 0033 07/29/18 0330 07/30/18 0905 07/31/18 0349 08/01/18 0353  NA 145   < > 150* 151* 150* 149* 144  K 4.0   < > 5.1 4.1 3.9 3.8 3.2*  CL 107   < > 112* 112* 113* 111 119*  CO2 31   < > '25 28 30 ' 33* 25  GLUCOSE 254*   < > 260* 296* 269* 151* 150*  BUN 38*   < > 42* 45* 38* 38* 30*  CREATININE 1.16   < > 1.48* 1.44* 1.21 1.16 0.90  CALCIUM 8.5*   < > 9.0 8.5* 7.5* 7.9* 5.8*  MG 2.4  --  2.5*  --  2.5* 2.4  --   PHOS 3.5  --  7.0*  --   --  2.8  --    < > = values in this interval not displayed.   GFR: Estimated Creatinine Clearance: 85.2 mL/min (by C-G formula based on SCr of 0.9 mg/dL). Recent Labs  Lab 07/28/18 0250 07/29/18 0033 07/29/18 0323 07/31/18 0349 07/31/18 1026 08/01/18 0353  PROCALCITON  --   --   --   --  0.18  --   WBC 14.6* 11.0*  --  16.0*  --  11.3*  LATICACIDVEN  --  3.8* 1.3  --   --   --    Liver Function Tests: Recent Labs  Lab 07/28/18 0250 07/29/18 0330  AST 62* 549*  ALT 143* 528*  ALKPHOS 117 128*  BILITOT 0.8 0.6  PROT 6.7 5.4*  ALBUMIN 2.8* 2.3*   No results for input(s): LIPASE, AMYLASE in the last 168 hours. No results for input(s): AMMONIA in the last 168 hours. ABG    Component Value Date/Time   PHART 7.450 07/31/2018 0410   PCO2ART 51.3 (H) 07/31/2018 0410   PO2ART 213 (H) 07/31/2018 0410   HCO3 35.0 (H) 07/31/2018 0410   TCO2 34 (H) 07/29/2018 0056   ACIDBASEDEF 2.0 11/09/2016 2003   O2SAT 99.3 07/31/2018 0410    Coagulation Profile: No results for input(s): INR, PROTIME in the last 168 hours. Cardiac Enzymes: Recent Labs  Lab 07/29/18 0033 07/29/18 0330  07/29/18 1254  TROPONINI 0.07* 0.09* 0.07*   HbA1C: Hgb A1c MFr Bld  Date/Time Value Ref Range Status  06/19/2018 10:56 AM 9.5 (H) 4.6 - 6.5 % Final    Comment:    Glycemic Control Guidelines for People with Diabetes:Non Diabetic:  <6%Goal of Therapy: <7%Additional Action Suggested:  >8%   03/13/2018 10:53 AM 10.2 (H) 4.6 - 6.5 % Final    Comment:    Glycemic Control Guidelines for People with Diabetes:Non Diabetic:  <6%Goal of Therapy: <7%Additional Action Suggested:  >8%    CBG: Recent Labs  Lab 08/01/18 0300 08/01/18 0355 08/01/18 0453 08/01/18 0555 08/01/18 0656  GLUCAP 194* 158* 149* 149* 168*   App cct 36 min  Richardson Landry Minor ACNP Maryanna Shape PCCM Pager [phone] till 1 pm If no answer page [phone] 08/01/2018, 8:25 AM  Attending Note:  77 year old male s/p cardiac arrest who is diuresing well at this point.  On exam, coarse BS noted.  I reviewed CXR myself, ETT is in good position.  Will continue diureses for now.  Continue weaning but no extubation for now.  Once extubated will be a one way.  Hold off abx.  Continue monitoring in the ICU.  DNR status confirmed.  PCCM will continue to follow.  The patient is critically ill with multiple organ systems failure and requires high complexity decision making for assessment and support, frequent evaluation and titration of therapies, application of advanced monitoring technologies and extensive interpretation of multiple databases.   Critical Care Time devoted to patient care services described in this note is  32  Minutes. This time reflects time of care of this signee Dr Jennet Maduro. This critical care time does not reflect procedure time, or teaching time or supervisory time of PA/NP/Med student/Med Resident etc but could involve care discussion time.  Rush Farmer, M.D. [facility] Pulmonary/Critical Care Medicine. Pager: [phone]. After hours pager: [phone].

## 2018-08-01 NOTE — Progress Notes (Signed)
Occupational Therapy Treatment Patient Details Name: Edward [name] MRN: [id number] DOB: October 21, 1941 Today's Date: 08/01/2018    History of present illness pt is a 77 y/o male admitted with neck pain and arm weakness s/p C3-7 aCDF on 9/16. Pt with aystole on 9/19, VDRF 9/19-9/26. PMhx: CAD, CKD, DM2, CABG, HTN, He sustained another cardiac arrest/PEA 9/29, received epi x3 and then developed VF requiring shock, total downtime documented as 7 minutes but head CT concerning of loss of gray and white matter.  He also had clonic or myoclonic activity. Inutbated   OT comments  Focus of session on B UE exercise as detailed below. RN to begin working on sitting tolerance using chair position of bed.   Follow Up Recommendations  SNF;LTACH;Supervision/Assistance - 24 hour    Equipment Recommendations       Recommendations for Other Services      Precautions / Restrictions Precautions Precautions: Fall;Cervical       Mobility Bed Mobility                  Transfers                      Balance                                           ADL either performed or assessed with clinical judgement   ADL                                         General ADL Comments: Can perform hand to mouth and rotate head from vent to midline.     Vision       Perception     Praxis      Cognition Arousal/Alertness: Awake/alert Behavior During Therapy: Flat affect Overall Cognitive Status: Difficult to assess                                          Exercises Exercises: General Upper Extremity General Exercises - Upper Extremity Shoulder Flexion: AAROM;Both;10 reps;Supine Shoulder Horizontal ABduction: Both;10 reps;Supine;AAROM Shoulder Horizontal ADduction: Both;10 reps;Supine;AAROM Elbow Flexion: Strengthening;10 reps;Supine Elbow Extension: Strengthening;Both;10 reps;Supine Digit Composite Flexion: AROM;Both;5  reps;Supine Composite Extension: AROM;Both;10 reps;Supine   Shoulder Instructions       General Comments Recommended pt begin working toward chair position of bed with nursing staff.     Pertinent Vitals/ Pain       Pain Assessment: 0-10 Pain Score: 8  Pain Location: chest Pain Descriptors / Indicators: (pointed to chest, used fingers for rating pain) Pain Intervention(s): Monitored during session;Repositioned;Patient requesting pain meds-RN notified  Home Living                                          Prior Functioning/Environment              Frequency  Min 2X/week        Progress Toward Goals  OT Goals(current goals can now be found in the care plan section)  Progress towards OT goals: Progressing toward goals  Acute Rehab OT  Goals Patient Stated Goal: shook head yes to working with me OT Goal Formulation: With patient Time For Goal Achievement: 08/14/18 Potential to Achieve Goals: Good  Plan Discharge plan remains appropriate    Co-evaluation                 AM-PAC PT "6 Clicks" Daily Activity     Outcome Measure   Help from another person eating meals?: Total Help from another person taking care of personal grooming?: Total   Help from another person bathing (including washing, rinsing, drying)?: A Lot Help from another person to put on and taking off regular upper body clothing?: Total Help from another person to put on and taking off regular lower body clothing?: Total 6 Click Score: 6    End of Session    OT Visit Diagnosis: Other abnormalities of gait and mobility (R26.89);Muscle weakness (generalized) (M62.81)   Activity Tolerance Patient tolerated treatment well   Patient Left in bed;with nursing/sitter in room   Nurse Communication Other (comment);Patient requests pain meds(use chair feature of bed)        Time: 6222-9798 OT Time Calculation (min): 33 min  Charges: OT General Charges $OT Visit: 1  Visit OT Treatments $Therapeutic Exercise: 23-37 mins  Nestor Lewandowsky, OTR/L Acute Rehabilitation Services Pager: [phone] Office: [phone]   [name] 08/01/2018, 2:22 PM

## 2018-08-01 NOTE — Progress Notes (Signed)
[facility] Progress Note Patient Name: JAJUAN SKOOG DOB: [birth year] MRN: [id number]   Date of Service  08/01/2018  HPI/Events of Note  Potassium 3.2 and calcium 5.8  eICU Interventions  replaced     Intervention Category Intermediate Interventions: Electrolyte abnormality - evaluation and management  Mauri Brooklyn, P 08/01/2018, 5:03 AM

## 2018-08-01 NOTE — Progress Notes (Signed)
Neurosurgery Service Progress Note  Subjective: No acute events overnight  Objective: Vitals:   08/01/18 0500 08/01/18 0604 08/01/18 0727 08/01/18 0759  BP: (!) 112/43 (!) 117/36 (!) 127/44 (!) 131/58  Pulse: 77 73  74  Resp: '20 20  14  ' Temp:   99.5 F (37.5 C)   TempSrc:   Oral   SpO2: 99% 100%  100%  Weight:      Height:       Temp (24hrs), Avg:99 F (37.2 C), Min:98.5 F (36.9 C), Max:99.5 F (37.5 C)  CBC Latest Ref Rng & Units 08/01/2018 07/31/2018 07/29/2018  WBC 4.0 - 10.5 K/uL 11.3(H) 16.0(H) 11.0(H)  Hemoglobin 13.0 - 17.0 g/dL 8.6(L) 12.1(L) 12.9(L)  Hematocrit 39.0 - 52.0 % 28.9(L) 39.6 40.2  Platelets 150 - 400 K/uL 203 247 289   BMP Latest Ref Rng & Units 08/01/2018 07/31/2018 07/30/2018  Glucose 70 - 99 mg/dL 150(H) 151(H) 269(H)  BUN 8 - 23 mg/dL 30(H) 38(H) 38(H)  Creatinine 0.61 - 1.24 mg/dL 0.90 1.16 1.21  BUN/Creat Ratio 10 - 24 - - -  Sodium 135 - 145 mmol/L 144 149(H) 150(H)  Potassium 3.5 - 5.1 mmol/L 3.2(L) 3.8 3.9  Chloride 98 - 111 mmol/L 119(H) 111 113(H)  CO2 22 - 32 mmol/L 25 33(H) 30  Calcium 8.9 - 10.3 mg/dL 5.8(LL) 7.9(L) 7.5(L)    Intake/Output Summary (Last 24 hours) at 08/01/2018 0823 Last data filed at 08/01/2018 0700 Gross per 24 hour  Intake 2288.94 ml  Output 3030 ml  Net -741.06 ml    Current Facility-Administered Medications:  .  Place/Maintain arterial line, , , Until Discontinued **AND** 0.9 %  sodium chloride infusion, , Intra-arterial, PRN, Omar Person, NP .  chlorhexidine gluconate (MEDLINE KIT) (PERIDEX) 0.12 % solution 15 mL, 15 mL, Mouth Rinse, BID, Rigoberto Noel, MD, 15 mL at 08/01/18 0745 .  dextrose 10 % infusion, , Intravenous, Continuous PRN, Rush Farmer, MD .  dicyclomine (BENTYL) 10 MG/5ML syrup 10 mg, 10 mg, Per Tube, BID, Rigoberto Noel, MD, 10 mg at 07/31/18 2121 .  enoxaparin (LOVENOX) injection 40 mg, 40 mg, Subcutaneous, Q24H, Rigoberto Noel, MD, 40 mg at 07/31/18 0923 .  erythromycin ophthalmic  ointment 1 application, 1 application, Both Eyes, Daily PRN, Rigoberto Noel, MD .  famotidine (PEPCID) 40 MG/5ML suspension 20 mg, 20 mg, Per Tube, Daily, Agarwala, Ravi, MD, 20 mg at 07/31/18 0923 .  feeding supplement (PRO-STAT SUGAR FREE 64) liquid 30 mL, 30 mL, Per Tube, TID, Rigoberto Noel, MD, 30 mL at 07/31/18 2120 .  feeding supplement (VITAL HIGH PROTEIN) liquid 1,000 mL, 1,000 mL, Per Tube, Continuous, Rigoberto Noel, MD, Last Rate: 50 mL/hr at 08/01/18 0206 .  fentaNYL (SUBLIMAZE) injection 25 mcg, 25 mcg, Intravenous, Q1H PRN, Rigoberto Noel, MD, 25 mcg at 08/01/18 0757 .  free water 300 mL, 300 mL, Per Tube, Q4H, Rush Farmer, MD, 300 mL at 08/01/18 0805 .  hydrALAZINE (APRESOLINE) tablet 25 mg, 25 mg, Oral, Q8H, Jettie Booze, MD, 25 mg at 08/01/18 0518 .  insulin aspart (novoLOG) injection 0-20 Units, 0-20 Units, Subcutaneous, Q4H, Minor, Grace Bushy, NP .  insulin detemir (LEVEMIR) injection 25 Units, 25 Units, Subcutaneous, BID, Minor, Grace Bushy, NP .  insulin regular (NOVOLIN R,HUMULIN R) 100 Units in sodium chloride 0.9 % 100 mL (1 Units/mL) infusion, , Intravenous, Continuous, Rush Farmer, MD, Last Rate: 5.2 mL/hr at 08/01/18 0700 .  lactated ringers infusion, ,  Intravenous, Continuous, Rigoberto Noel, MD, Stopped at 07/30/18 1259 .  levETIRAcetam (KEPPRA) IVPB 500 mg/100 mL premix, 500 mg, Intravenous, Q12H, Greta Doom, MD, Stopped at 07/31/18 2146 .  MEDLINE mouth rinse, 15 mL, Mouth Rinse, 10 times per day, Rigoberto Noel, MD, 15 mL at 08/01/18 0518 .  midazolam (VERSED) injection 1 mg, 1 mg, Intravenous, Q15 min PRN, Omar Person, NP, 1 mg at 07/30/18 1757 .  midazolam (VERSED) injection 1 mg, 1 mg, Intravenous, Q2H PRN, Omar Person, NP, 1 mg at 07/29/18 0125 .  norepinephrine (LEVOPHED) 61m in D5W 2556mpremix infusion, 0-10 mcg/min, Intravenous, Titrated, EuOmar PersonNP, Stopped at 07/29/18 1606 .  polyvinyl alcohol (LIQUIFILM  TEARS) 1.4 % ophthalmic solution 1 drop, 1 drop, Both Eyes, TID, AlRigoberto NoelMD, 1 drop at 07/31/18 2125 .  potassium chloride 10 mEq in 100 mL IVPB, 10 mEq, Intravenous, Q1 Hr x 5, SmMauri BrooklynMD, Last Rate: 100 mL/hr at 08/01/18 0743, 10 mEq at 08/01/18 0743 .  prednisoLONE acetate (PRED FORTE) 1 % ophthalmic suspension 2 drop, 2 drop, Both Eyes, TID AC & HS, AlRigoberto NoelMD, 2 drop at 08/01/18 0744 .  primidone (MYSOLINE) tablet 50 mg, 50 mg, Per Tube, BID, AlRigoberto NoelMD, 50 mg at 07/31/18 2121 .  rosuvastatin (CRESTOR) tablet 20 mg, 20 mg, Per Tube, Once per day on Tue Thu, Alva, Rakesh V, MD, 20 mg at 07/31/18 1157   Physical Exam: Intubated, awake/alert, PERRL, gaze conjugate, FCx4 with good strength, mitts on hands Incision c/d/i, neck soft, no evidence of hematoma  Assessment & Plan: [age].o. man s/p 4 level ACDF, recovering well immediately post-op with resolution of radicular pain and weakness, 9/19 code for asystole of unclear cause, CT C-sp no hematoma, CTH neg. MRI C-spine shows no evidence of epidural hematoma, minimal soft tissue swelling, no hematoma, good canal decompression. 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 9/26 extubated, 9/29 repeat code, agonal breathing followed by PEA then VF, ROSC p 7 minutes, reintubated.  -no change in neurosurgical plan of care -please call with concerns or questions  ThJudith Part10/02/19 8:23 AM

## 2018-08-02 ENCOUNTER — Inpatient Hospital Stay (HOSPITAL_COMMUNITY): Payer: Medicare Other

## 2018-08-02 LAB — CBC WITH DIFFERENTIAL/PLATELET
ABS IMMATURE GRANULOCYTES: 0.1 10*3/uL (ref 0.0–0.1)
BASOS PCT: 0 %
Basophils Absolute: 0 10*3/uL (ref 0.0–0.1)
EOS ABS: 0.3 10*3/uL (ref 0.0–0.7)
Eosinophils Relative: 3 %
HCT: 36.4 % — ABNORMAL LOW (ref 39.0–52.0)
Hemoglobin: 11.2 g/dL — ABNORMAL LOW (ref 13.0–17.0)
Immature Granulocytes: 1 %
LYMPHS ABS: 1.8 10*3/uL (ref 0.7–4.0)
Lymphocytes Relative: 14 %
MCH: 28.1 pg (ref 26.0–34.0)
MCHC: 30.8 g/dL (ref 30.0–36.0)
MCV: 91.2 fL (ref 78.0–100.0)
Monocytes Absolute: 1.2 10*3/uL — ABNORMAL HIGH (ref 0.1–1.0)
Monocytes Relative: 10 %
NEUTROS ABS: 9.2 10*3/uL — AB (ref 1.7–7.7)
NEUTROS PCT: 72 %
PLATELETS: 299 10*3/uL (ref 150–400)
RBC: 3.99 MIL/uL — AB (ref 4.22–5.81)
RDW: 13.4 % (ref 11.5–15.5)
WBC: 12.6 10*3/uL — AB (ref 4.0–10.5)

## 2018-08-02 LAB — GLUCOSE, CAPILLARY
GLUCOSE-CAPILLARY: 198 mg/dL — AB (ref 70–99)
GLUCOSE-CAPILLARY: 190 mg/dL — AB (ref 70–99)
Glucose-Capillary: 173 mg/dL — ABNORMAL HIGH (ref 70–99)
Glucose-Capillary: 186 mg/dL — ABNORMAL HIGH (ref 70–99)
Glucose-Capillary: 189 mg/dL — ABNORMAL HIGH (ref 70–99)
Glucose-Capillary: 208 mg/dL — ABNORMAL HIGH (ref 70–99)
Glucose-Capillary: 218 mg/dL — ABNORMAL HIGH (ref 70–99)

## 2018-08-02 LAB — BASIC METABOLIC PANEL
Anion gap: 4 — ABNORMAL LOW (ref 5–15)
BUN: 35 mg/dL — ABNORMAL HIGH (ref 8–23)
CALCIUM: 7.9 mg/dL — AB (ref 8.9–10.3)
CO2: 31 mmol/L (ref 22–32)
Chloride: 105 mmol/L (ref 98–111)
Creatinine, Ser: 1 mg/dL (ref 0.61–1.24)
Glucose, Bld: 215 mg/dL — ABNORMAL HIGH (ref 70–99)
POTASSIUM: 4.1 mmol/L (ref 3.5–5.1)
Sodium: 140 mmol/L (ref 135–145)

## 2018-08-02 LAB — PHOSPHORUS: PHOSPHORUS: 3.1 mg/dL (ref 2.5–4.6)

## 2018-08-02 LAB — MAGNESIUM: MAGNESIUM: 2.2 mg/dL (ref 1.7–2.4)

## 2018-08-02 MED ORDER — ORAL CARE MOUTH RINSE
15.0000 mL | Freq: Two times a day (BID) | OROMUCOSAL | Status: DC
  Administered 2018-08-02 – 2018-08-07 (×11): 15 mL via OROMUCOSAL

## 2018-08-02 MED ORDER — FREE WATER
300.0000 mL | Freq: Three times a day (TID) | Status: DC
  Administered 2018-08-02 – 2018-08-07 (×16): 300 mL

## 2018-08-02 MED ORDER — INSULIN DETEMIR 100 UNIT/ML ~~LOC~~ SOLN
30.0000 [IU] | Freq: Two times a day (BID) | SUBCUTANEOUS | Status: DC
  Administered 2018-08-02 – 2018-08-07 (×10): 30 [IU] via SUBCUTANEOUS
  Filled 2018-08-02 (×13): qty 0.3

## 2018-08-02 NOTE — Progress Notes (Addendum)
NAME:  Edward [name], MRN:  [id number], DOB:  1941/01/08, LOS: 21 ADMISSION DATE:  07/13/2018, CONSULTATION DATE: 9/19 REFERRING MD:  Roylene Reason, CHIEF COMPLAINT:  Cardiac arrest    Brief History   77 year old patient with history of diabetes with history of difficult glycemic control and cervical stenosis resulting in cord compression underwent anterior cervical disc fusion of cervical spine on September 16.  On 9/19 began to notice difficulty swallowing, not able to even swallow his own saliva.  Had been ordered Decadron but this was not given.  He was found by nursing staff unresponsive and in asystole.  He underwent 1 round of ACLS with successful return of spontaneous circulation.  Tx to ICU for hypothermia protocol.   Significant Hospital Events   9/16: Anterior cervical disc fusion 4/40: Asystolic arrest.  Estimated time to return of spontaneous circulation approximately 3 minutes.  Unresponsive not following commands postarrest so hypothermia protocol initiated.  Goal temperature met at: 1030 pm  9/20: Hemodynamically stable on low-dose norepinephrine 9/21: completed rewarming. Paralytic and sedation discontinued. 9/22: paralysis/sedation and vasopressor therapy discontinued yesterday. Restlessness developed last night. IV fentanyl started. Reqired iv labetalol to treat HTN. Has a h/o HTN. 9/24>> Developed fever Vanc/Zosyn started 9/25 weaned off sedation and pressors.  Weaning but copious secretions. 9/26>> Extubated to 4 L  9/27>> Failed swallow, weaned to RA 9/29>> re-intubated 9/30>>>Spoke with family extensively, brother is POA, remains hypernatremic and slowly weaning.  No trach/peg.  One way extubation when optimized. 10/1>> Weaning on 12/5, transitioning off insulin gtt  08/01/2018 now off insulin drip.  Weaning well.  Awake and alert.  Ready for extubation  Consults: date of consult/date signed off & final recs:  Neuro 9/22>>> Cardiology 9/20>>>  Procedures (surgical  and bedside):  9/16: Anterior cervical disc fusion by neurosurgery 9/19: Oral endotracheal tube>> 9/26>>10/3 9/19: Left internal jugular vein triple-lumen catheter>>> 9/27  Significant Diagnostic Tests:  9/23 MRI Head Caudate and lentiform nuclei increased T2 signal and heterogeneous reduced diffusion compatible with hypoxic ischemic injury of the brain in the setting of cardiac arrest. No hemorrhage, mass effect, or herniation 9/23 MRI cervical spine: No acute osseous or cord signal abnormality identified. Interval C3-C7 anterior cervical fusion and discectomy. Small prevertebral fluid collection extending into the right anterior neck compatible with postsurgical changes. No epidural collection. Mildly improved patency of the cervical spinal canal. Stable residual multilevel foraminal stenosis from uncovertebral and facet Hypertrophy. 9/23 MRI thoracic spine Mild thoracic spine spondylosis. No high-grade foraminal or canal Stenosis 9/19: CT brain noncontrasted: Negative CT cervical spine 9/19: Status post C3-C7 ACDF with expected postoperative changes.  No fracture or malalignment 2D echo 9/20 >>>Left ventricle: The cavity size was normal. There was severe   concentric hypertrophy. Systolic function was vigorous. The   estimated ejection fraction was in the range of 65% to 70%. Wall   motion was normal; there were no regional wall motion   abnormalities. Doppler parameters are consistent with abnormal   left ventricular relaxation (grade 1 diastolic dysfunction).   Doppler parameters are consistent with elevated mean left atrial   filling pressure. - Mitral valve: Valve area by pressure half-time: 2.04 cm^2. - Left atrium: The atrium was mildly dilated. 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 rate 14, height _0  (1.727 m), weight 112.9 kg, SpO2 100 %.    Vent Mode: CPAP;PSV FiO2 (%):  [40 %] 40 % Set Rate:  [20 bmp] 20 bmp PEEP:  [5 cmH20] 5 cmH20 Pressure Support:  [5 cmH20] 5 cmH20 Plateau Pressure:  [13 cmH20-15 cmH20] 13 cmH20   Intake/Output Summary (Last 24 hours) at 08/02/2018 1036 Last data filed at 08/02/2018 0800 Gross per 24 hour  Intake 2166.41 ml  Output 2730 ml  Net -563.59 ml   Filed Weights   07/31/18 0300 08/01/18 0402 08/02/18 0500  Weight: 113.6 kg 113.2 kg 112.9 kg   Examination: General: Awake alert following commands HEENT: Tracheal tube in place core track in right nares Neuro:  CV: Sounds regular PULM: Decreased in the bases YB:WLSL, non-tender, bsx4 active  Extremities: warm/dry, negative edema  Skin: no rashes or lesions    Resolved Hospital Problem list    Metabolic encephalopathy  Assessment & Plan:  Status post asystolic cardiac arrest.  Presumed hypoxia mediated given prior OSA and report of inadequate airway protection pre-arrest. H/O coronary artery disease and CABG back in 2019 with chronic diastolic heart failure HTN   Plan Monitor in intensive care  He is a DNR once extubated stable can move out of the unit    Ventilator dependence status post cardiopulmonary arrest>> resolved H/o OSA  Plan Failed Swallow 9/27 Re-intubated 9/29 Weaning 07/31/2018 on 12/5 Plan: Plan extubate 08/02/2018 with no reintubation  ID Aspiration pneumonia CXR resolving ABX discontinued 9/26 High risk aspiration as failed swallow  Re-intubated 9/30 Remains afebrile No infiltrate per CXR Plan: Follow white count   Acute metabolic encephalopathy in setting of possible anoxic brain injury.  Resolved -Initial CT imaging negative - MRI 9/23 : Caudate and  lentiform nuclei increased T2 signal and heterogeneous reduced diffusion compatible with hypoxic ischemic injury of the brain in the setting of cardiac arrest. Awake and alert  Plan: 08/02/2018 awake alert    Chronic kidney disease stage III Htpernatremia Recent Labs  Lab 07/31/18 0349 08/01/18 0353 08/02/18 0451  NA 149* 144 140   Lab Results  Component Value Date   CREATININE 1.00 08/02/2018   CREATININE 0.90 08/01/2018   CREATININE 1.16 07/31/2018   CREATININE 1.33 (H) 12/31/2015    Plan: Decrease free water to 150 every 6 hours Monitor electrolytes   GI Failed Swallow 9/27 Weak cough>> high risk aspiration Weak and deconditioned  Plan: Continue tube feeding Swallow eval if extubated  Deconditioning Plan: Mobilize when extubated  Lab Results  Component Value Date   CREATININE 1.00 08/02/2018   CREATININE 0.90 08/01/2018   CREATININE 1.16 07/31/2018   CREATININE 1.33 (H) 12/31/2015    Intake/Output Summary (Last 24 hours) at 08/02/2018 1036 Last data filed at 08/02/2018 0800 Gross per 24 hour  Intake 2166.41 ml  Output 2730 ml  Net -563.59 ml     Diabetes   Plan  Scale insulin Increase Levemir to 30 units 08/02/2018   Status post anterior cervical disc fusion 9/16 Plan Off steroid Neurosurgery is following   Disposition / Summary of Today's Plan 08/02/18   He is a DNR Weaning from ventilator One-way extubation plan for 08/02/2018    Diet: enteral feeding Pain/Anxiety/Delirium protocol (if indicated): PAD protocol VAP protocol (if indicated) ordered 9/20 DVT prophylaxis: low molecular weight heparin  GI prophylaxis: H2 blockade Hyperglycemia protocol: Hyperglycemia protocol Mobility: Bedrest>>  Mobilize per PT/OT Code Status: DO NOT RESUSCITATE Family Communication: No family bedside 08/01/2018  Labs   CBC: Recent Labs  Lab 07/29/18 0033 07/31/18 0349 08/01/18 0353 08/01/18 0835 08/02/18 0451  WBC 11.0* 16.0* 11.3* 14.7* 12.6*    NEUTROABS  --   --   --   --  9.2*  HGB 12.9* 12.1* 8.6* 11.6* 11.2*  HCT 40.2 39.6 28.9* 38.4* 36.4*  MCV 90.7 92.5 94.8 93.0 91.2  PLT 289 247 203 272 295   Basic Metabolic Panel: Recent Labs  Lab 07/29/18 0033 07/29/18 0330 07/30/18 0905 07/31/18 0349 08/01/18 0353 08/02/18 0451  NA 150* 151* 150* 149* 144 140  K 5.1 4.1 3.9 3.8 3.2* 4.1  CL 112* 112* 113* 111 119* 105  CO2 _0 33* 25 31  GLUCOSE 260* 296* 269* 151* 150* 215*  BUN 42* 45* 38* 38* 30* 35*  CREATININE 1.48* 1.44* 1.21 1.16 0.90 1.00  CALCIUM 9.0 8.5* 7.5* 7.9* 5.8* 7.9*  MG 2.5*  --  2.5* 2.4  --  2.2  PHOS 7.0*  --   --  2.8  --  3.1   GFR: Estimated Creatinine Clearance: 76.6 mL/min (by C-G formula based on SCr of 1 mg/dL). Recent Labs  Lab 07/29/18 0033 07/29/18 0323 07/31/18 0349 07/31/18 1026 08/01/18 0353 08/01/18 0835 08/02/18 0451  PROCALCITON  --   --   --  0.18  --   --   --   WBC 11.0*  --  16.0*  --  11.3* 14.7* 12.6*  LATICACIDVEN 3.8* 1.3  --   --   --   --   --    Liver Function Tests: Recent Labs  Lab 07/28/18 0250 07/29/18 0330  AST 62* 549*  ALT 143* 528*  ALKPHOS 117 128*  BILITOT 0.8 0.6  PROT 6.7 5.4*  ALBUMIN 2.8* 2.3*   No results for input(s): LIPASE, AMYLASE in the last 168 hours. No results for input(s): AMMONIA in the last 168 hours. ABG    Component Value Date/Time   PHART 7.450 07/31/2018 0410   PCO2ART 51.3 (H) 07/31/2018 0410   PO2ART 213 (H) 07/31/2018 0410   HCO3 35.0 (H) 07/31/2018 0410   TCO2 34 (H) 07/29/2018 0056   ACIDBASEDEF 2.0 11/09/2016 2003   O2SAT 99.3 07/31/2018 0410    Coagulation Profile: No results for input(s): INR, PROTIME in the last 168 hours. Cardiac Enzymes: Recent Labs  Lab 07/29/18 0033 07/29/18 0330 07/29/18 1254  TROPONINI 0.07* 0.09* 0.07*   HbA1C: Hgb A1c MFr Bld  Date/Time Value Ref Range Status  06/19/2018 10:56 AM 9.5 (H) 4.6 - 6.5 % Final    Comment:    Glycemic Control Guidelines for People with  Diabetes:Non Diabetic:  <6%Goal of Therapy: <7%Additional Action Suggested:  >8%   03/13/2018 10:53 AM 10.2 (H) 4.6 - 6.5 % Final    Comment:    Glycemic Control Guidelines for People with Diabetes:Non Diabetic:  <6%Goal of Therapy: <7%Additional Action Suggested:  >8%    CBG: Recent Labs  Lab 08/01/18 1601 08/01/18 2132 08/01/18 2329 08/02/18 0453 08/02/18 0823  GLUCAP 208* 272* 277* 198* 173*   App cct 34 min  Richardson Landry Minor ACNP Maryanna Shape PCCM Pager [phone] till 1 pm If no answer page [phone] 08/02/2018, 10:36 AM  Attending Note:  77 year old male s/p cardiac arrest.  Patient is alert and interactive on exam with clear lungs.  I reviewed CXR myself, ETT is in a good position.  Will proceed with extubation.  NCB post extubation.  Hold off diureses today.  BMET in AM.  Replace electrolytes as indicated.  Called family with no response to notify them of extubation.  Post extubation if doing well then will transfer to med-surg and to [facility] service with PCCM off 10/4.  The patient is critically ill with multiple organ systems failure and requires high complexity decision making for assessment and support, frequent evaluation and titration of therapies, application of advanced monitoring technologies and extensive interpretation of multiple databases.   Critical Care Time devoted to patient care services described in this note is  31  Minutes. This time reflects time of care of this signee Dr Jennet Maduro. This critical care time does not reflect procedure time, or teaching time or supervisory time of PA/NP/Med student/Med Resident etc but could involve care discussion time.  Rush Farmer, M.D. [facility] Pulmonary/Critical Care Medicine. Pager: [phone]. After hours pager: [phone].

## 2018-08-02 NOTE — Progress Notes (Signed)
Neurosurgery Service Progress Note  Subjective: No acute events overnight  Objective: Vitals:   08/02/18 1200 08/02/18 1500 08/02/18 1551 08/02/18 1600  BP: (!) 139/58 (!) 128/54  (!) 132/51  Pulse: 82 88  89  Resp: 20 18  20   Temp:   98.2 F (36.8 C)   TempSrc:   Oral   SpO2: 100% 100%  100%  Weight:      Height:       Temp (24hrs), Avg:98.5 F (36.9 C), Min:97.9 F (36.6 C), Max:99.2 F (37.3 C)  CBC Latest Ref Rng & Units 08/02/2018 08/01/2018 08/01/2018  WBC 4.0 - 10.5 K/uL 12.6(H) 14.7(H) 11.3(H)  Hemoglobin 13.0 - 17.0 g/dL 11.2(L) 11.6(L) 8.6(L)  Hematocrit 39.0 - 52.0 % 36.4(L) 38.4(L) 28.9(L)  Platelets 150 - 400 K/uL 299 272 203   BMP Latest Ref Rng & Units 08/02/2018 08/01/2018 07/31/2018  Glucose 70 - 99 mg/dL 215(H) 150(H) 151(H)  BUN 8 - 23 mg/dL 35(H) 30(H) 38(H)  Creatinine 0.61 - 1.24 mg/dL 1.00 0.90 1.16  BUN/Creat Ratio 10 - 24 - - -  Sodium 135 - 145 mmol/L 140 144 149(H)  Potassium 3.5 - 5.1 mmol/L 4.1 3.2(L) 3.8  Chloride 98 - 111 mmol/L 105 119(H) 111  CO2 22 - 32 mmol/L 31 25 33(H)  Calcium 8.9 - 10.3 mg/dL 7.9(L) 5.8(LL) 7.9(L)    Intake/Output Summary (Last 24 hours) at 08/02/2018 1621 Last data filed at 08/02/2018 1600 Gross per 24 hour  Intake 2000 ml  Output 2930 ml  Net -930 ml    Current Facility-Administered Medications:  .  Place/Maintain arterial line, , , Until Discontinued **AND** 0.9 %  sodium chloride infusion, , Intra-arterial, PRN, Dewaine Oats, Katalina M, NP .  dextrose 10 % infusion, , Intravenous, Continuous PRN, Rush Farmer, MD .  dicyclomine (BENTYL) 10 MG/5ML syrup 10 mg, 10 mg, Per Tube, BID, Rigoberto Noel, MD, 10 mg at 08/02/18 0919 .  enoxaparin (LOVENOX) injection 40 mg, 40 mg, Subcutaneous, Q24H, Rigoberto Noel, MD, 40 mg at 08/02/18 0919 .  erythromycin ophthalmic ointment 1 application, 1 application, Both Eyes, Daily PRN, Rigoberto Noel, MD .  famotidine (PEPCID) 40 MG/5ML suspension 20 mg, 20 mg, Per Tube, Daily,  Agarwala, Ravi, MD, 20 mg at 08/02/18 0919 .  feeding supplement (PRO-STAT SUGAR FREE 64) liquid 30 mL, 30 mL, Per Tube, TID, Rigoberto Noel, MD, 30 mL at 08/02/18 1612 .  feeding supplement (VITAL HIGH PROTEIN) liquid 1,000 mL, 1,000 mL, Per Tube, Continuous, Rigoberto Noel, MD, Last Rate: 50 mL/hr at 08/01/18 0206 .  fentaNYL (SUBLIMAZE) injection 25 mcg, 25 mcg, Intravenous, Q1H PRN, Rigoberto Noel, MD, 25 mcg at 08/02/18 0109 .  free water 300 mL, 300 mL, Per Tube, Q8H, Minor, Grace Bushy, NP, 300 mL at 08/02/18 1254 .  hydrALAZINE (APRESOLINE) tablet 25 mg, 25 mg, Oral, Q8H, Jettie Booze, MD, 25 mg at 08/02/18 1252 .  insulin aspart (novoLOG) injection 0-20 Units, 0-20 Units, Subcutaneous, Q4H, Minor, Grace Bushy, NP, 4 Units at 08/02/18 1612 .  insulin detemir (LEVEMIR) injection 30 Units, 30 Units, Subcutaneous, BID, Minor, Grace Bushy, NP .  insulin regular (NOVOLIN R,HUMULIN R) 100 Units in sodium chloride 0.9 % 100 mL (1 Units/mL) infusion, , Intravenous, Continuous, Rush Farmer, MD, Stopped at 08/01/18 1107 .  lactated ringers infusion, , Intravenous, Continuous, Rigoberto Noel, MD, Stopped at 07/30/18 1259 .  levETIRAcetam (KEPPRA) IVPB 500 mg/100 mL premix, 500 mg, Intravenous, Q12H, Leonel Ramsay,  Vida Roller, MD, Stopped at 08/02/18 [phone] .  MEDLINE mouth rinse, 15 mL, Mouth Rinse, BID, Rush Farmer, MD, 15 mL at 08/02/18 1253 .  midazolam (VERSED) injection 1 mg, 1 mg, Intravenous, Q15 min PRN, Omar Person, NP, 1 mg at 07/30/18 1757 .  midazolam (VERSED) injection 1 mg, 1 mg, Intravenous, Q2H PRN, Omar Person, NP, 1 mg at 07/29/18 0125 .  norepinephrine (LEVOPHED) 4mg  in D5W 255mL premix infusion, 0-10 mcg/min, Intravenous, Titrated, Omar Person, NP, Stopped at 07/29/18 1606 .  polyvinyl alcohol (LIQUIFILM TEARS) 1.4 % ophthalmic solution 1 drop, 1 drop, Both Eyes, TID, Rigoberto Noel, MD, 1 drop at 08/02/18 1612 .  prednisoLONE acetate (PRED FORTE) 1 %  ophthalmic suspension 2 drop, 2 drop, Both Eyes, TID AC & HS, Rigoberto Noel, MD, 2 drop at 08/02/18 1612 .  primidone (MYSOLINE) tablet 50 mg, 50 mg, Per Tube, BID, Rigoberto Noel, MD, 50 mg at 08/02/18 0927 .  rosuvastatin (CRESTOR) tablet 20 mg, 20 mg, Per Tube, Once per day on Tue Thu, Alva, Rakesh V, MD, 20 mg at 08/02/18 1751   Physical Exam: Extubated, awake/alert, PERRL, gaze conjugate, FCx4 with good strength, speech hypophonic and difficult to understand Incision c/d/i, neck soft, no evidence of hematoma  Assessment & Plan: 77 y.o. man s/p 4 level ACDF, recovering well immediately post-op with resolution of radicular pain and weakness, 9/19 code for asystole of unclear cause, CT C-sp no hematoma, CTH neg. MRI C-spine shows no evidence of epidural hematoma, minimal soft tissue swelling, no hematoma, good canal decompression. 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 9/26 extubated, 9/29 repeat code, agonal breathing followed by PEA then VF, ROSC p 7 minutes, reintubated.  -no change in neurosurgical plan of care -please call with concerns or questions  Judith Part  08/02/18 4:21 PM

## 2018-08-02 NOTE — Progress Notes (Signed)
Report called to receiving RN 3W. Patient with no complaints at the current time.

## 2018-08-02 NOTE — Progress Notes (Addendum)
Physical Therapy Treatment Patient Details Name: Edward [name] MRN: [id number] DOB: 27-Sep-1941 Today's Date: 08/02/2018    History of Present Illness pt is a 77 y/o male admitted with neck pain and arm weakness s/p C3-7 ACDF on 9/16. Pt with aystole on 9/19, VDRF 9/19-9/26. PMhx: CAD, CKD, DM2, CABG, HTN, He sustained another cardiac arrest/PEA 9/29, received epi x3 and then developed VF requiring shock, total downtime documented as 7 minutes but head CT concerning of loss of gray and white matter. Reintubated 9/29. Extubated 10/3.     PT Comments    Patient received following extubation today (10/3). Continues to display good motivation to participate in physical therapy session. Performing bed mobility with two person maximal assistance. Able to perform static and dynamic sitting balance with supervision, participating in therapeutic exercises. Requiring two person maximal assistance for serial sit to stands from edge of bed; able to clear hips but unable to achieve completely upright positioning. Consulted IP rehab to assess patient's appropriateness. Suspect patient will progress well based on PLOF, motivation, and activity tolerance in today's session.   Pre mobility vitals: 136/48, 100% SpO2, 80 HR Post mobility vitals: 148/89, 98% SpO2, 91 HR    Follow Up Recommendations  Supervision/Assistance - 24 hour(Post Acute Rehab )     Equipment Recommendations  Other (comment)(defer to next venue)    Recommendations for Other Services       Precautions / Restrictions Precautions Precautions: Fall;Cervical Restrictions Weight Bearing Restrictions: No    Mobility  Bed Mobility Overal bed mobility: Needs Assistance Bed Mobility: Rolling;Sidelying to Sit;Sit to Sidelying Rolling: Mod assist Sidelying to sit: Max assist;+2 for physical assistance     Sit to sidelying: Max assist;+2 for physical assistance General bed mobility comments: Patient able to perform limited left knee  flexion and reach with arm towards contralateral side; overall mod assist for rolling towards right and then max assist provided at trunk and with bed pad to bring to edge of bed.   Transfers Overall transfer level: Needs assistance Equipment used: Rolling walker (2 wheeled) Transfers: Sit to/from Stand Sit to Stand: From elevated surface;Max assist;+2 physical assistance         General transfer comment: Patient performed x3 partial sit to stands with maxA + 2 in order to bring hips up towards head of bed. Unable to achieve complete upright positioning. Tactile/verbal cueing for hand and feet placement. Deferred transfer to chair since patient was being transferred to another room   Ambulation/Gait                 Stairs             Wheelchair Mobility    Modified Rankin (Stroke Patients Only)       Balance Overall balance assessment: Needs assistance Sitting-balance support: Feet supported;No upper extremity supported Sitting balance-Leahy Scale: Fair Sitting balance - Comments: able to sit EOB with supervision. cues for upright posture                                     Cognition Arousal/Alertness: Awake/alert Behavior During Therapy: WFL for tasks assessed/performed Overall Cognitive Status: Impaired/Different from baseline Area of Impairment: Attention;Following commands;Safety/judgement;Problem solving                   Current Attention Level: Focused   Following Commands: Follows one step commands inconsistently;Follows one step commands with increased time  Problem Solving: Slow processing;Decreased initiation;Difficulty sequencing;Requires verbal cues;Requires tactile cues General Comments: Patient very pleasant and eager to participate in therapy. Recently extubated, mainly mouthing words and using gestures but was able to whisper occasionally.       Exercises General Exercises - Upper Extremity Elbow Flexion:  Strengthening;10 reps;Supine General Exercises - Lower Extremity Long Arc Quad: AROM;10 reps;Seated;Both    General Comments General comments (skin integrity, edema, etc.): VSS      Pertinent Vitals/Pain Pain Assessment: Faces Faces Pain Scale: No hurt    Home Living                      Prior Function            PT Goals (current goals can now be found in the care plan section) Acute Rehab PT Goals Patient Stated Goal: Pt pointing to chair to get up  PT Goal Formulation: With patient Time For Goal Achievement: 08/16/18 Potential to Achieve Goals: Fair Progress towards PT goals: Progressing toward goals    Frequency    Min 3X/week      PT Plan Discharge plan needs to be updated    Co-evaluation              AM-PAC PT "6 Clicks" Daily Activity  Outcome Measure  Difficulty turning over in bed (including adjusting bedclothes, sheets and blankets)?: Unable Difficulty moving from lying on back to sitting on the side of the bed? : Unable Difficulty sitting down on and standing up from a chair with arms (e.g., wheelchair, bedside commode, etc,.)?: Unable Help needed moving to and from a bed to chair (including a wheelchair)?: A Lot Help needed walking in hospital room?: Total Help needed climbing 3-5 steps with a railing? : Total 6 Click Score: 7    End of Session Equipment Utilized During Treatment: Gait belt;Oxygen Activity Tolerance: Patient tolerated treatment well Patient left: in bed;with call bell/phone within reach Nurse Communication: Mobility status PT Visit Diagnosis: Other abnormalities of gait and mobility (R26.89);Muscle weakness (generalized) (M62.81);Unsteadiness on feet (R26.81);Other symptoms and signs involving the nervous system (R29.898)     Time: 8250-5397 PT Time Calculation (min) (ACUTE ONLY): 27 min  Charges:  $Therapeutic Activity: 23-37 mins                     Ellamae Sia, [place], DPT Acute Rehabilitation  Services Pager [phone] Office [phone]    Willy Eddy 08/02/2018, 2:54 PM

## 2018-08-02 NOTE — Procedures (Signed)
Extubation Procedure Note  Patient Details:   Name: Edward [name] DOB: [birth year] MRN: [id number]   Airway Documentation:    Vent end date: 08/02/18 Vent end time: 1030   Evaluation  O2 sats: stable throughout Complications: No apparent complications Patient did tolerate procedure well. Bilateral Breath Sounds: Diminished, Clear   Yes pt able to vocalize.   Pt extubated at this time per MD order. Pt was able to breathe around deflated cuff. No stridor noted. Pt placed on 4L [place] and tolerating well.   Irineo Axon [facility] 08/02/2018, 11:12 AM

## 2018-08-02 NOTE — Progress Notes (Signed)
Rehab Admissions Coordinator Note:  Patient was screened by Cleatrice Burke for appropriateness for an Inpatient Acute Rehab Consult per PT recommendation. .  At this time, we are recommending Inpatient Rehab consult if pt would like to be considered for admit. Please advise.   Danne Baxter, RN, MSN Rehab Admissions Coordinator [phone] 08/02/2018 3:10 PM

## 2018-08-03 DIAGNOSIS — R1312 Dysphagia, oropharyngeal phase: Secondary | ICD-10-CM

## 2018-08-03 DIAGNOSIS — I5032 Chronic diastolic (congestive) heart failure: Secondary | ICD-10-CM

## 2018-08-03 LAB — CBC WITH DIFFERENTIAL/PLATELET
ABS IMMATURE GRANULOCYTES: 0.1 10*3/uL (ref 0.0–0.1)
Basophils Absolute: 0 10*3/uL (ref 0.0–0.1)
Basophils Relative: 0 %
Eosinophils Absolute: 0.3 10*3/uL (ref 0.0–0.7)
Eosinophils Relative: 2 %
HEMATOCRIT: 38 % — AB (ref 39.0–52.0)
Hemoglobin: 11.8 g/dL — ABNORMAL LOW (ref 13.0–17.0)
IMMATURE GRANULOCYTES: 0 %
LYMPHS ABS: 1.5 10*3/uL (ref 0.7–4.0)
Lymphocytes Relative: 12 %
MCH: 28 pg (ref 26.0–34.0)
MCHC: 31.1 g/dL (ref 30.0–36.0)
MCV: 90.3 fL (ref 78.0–100.0)
MONOS PCT: 9 %
Monocytes Absolute: 1 10*3/uL (ref 0.1–1.0)
NEUTROS ABS: 9 10*3/uL — AB (ref 1.7–7.7)
NEUTROS PCT: 77 %
Platelets: 317 10*3/uL (ref 150–400)
RBC: 4.21 MIL/uL — ABNORMAL LOW (ref 4.22–5.81)
RDW: 13.2 % (ref 11.5–15.5)
WBC: 11.8 10*3/uL — ABNORMAL HIGH (ref 4.0–10.5)

## 2018-08-03 LAB — GLUCOSE, CAPILLARY
GLUCOSE-CAPILLARY: 166 mg/dL — AB (ref 70–99)
GLUCOSE-CAPILLARY: 186 mg/dL — AB (ref 70–99)
Glucose-Capillary: 149 mg/dL — ABNORMAL HIGH (ref 70–99)
Glucose-Capillary: 225 mg/dL — ABNORMAL HIGH (ref 70–99)
Glucose-Capillary: 188 mg/dL — ABNORMAL HIGH (ref 70–99)
Glucose-Capillary: 179 mg/dL — ABNORMAL HIGH (ref 70–99)
Glucose-Capillary: 171 mg/dL — ABNORMAL HIGH (ref 70–99)

## 2018-08-03 LAB — BASIC METABOLIC PANEL
ANION GAP: 8 (ref 5–15)
BUN: 26 mg/dL — ABNORMAL HIGH (ref 8–23)
CO2: 31 mmol/L (ref 22–32)
Calcium: 8.1 mg/dL — ABNORMAL LOW (ref 8.9–10.3)
Chloride: 104 mmol/L (ref 98–111)
Creatinine, Ser: 0.94 mg/dL (ref 0.61–1.24)
GFR calc Af Amer: >60 mL/min (ref >60)
GLUCOSE: 187 mg/dL — AB (ref 70–99)
Potassium: 3.8 mmol/L (ref 3.5–5.1)
Sodium: 143 mmol/L (ref 135–145)

## 2018-08-03 LAB — PHOSPHORUS: PHOSPHORUS: 2.5 mg/dL (ref 2.5–4.6)

## 2018-08-03 LAB — MAGNESIUM: Magnesium: 2 mg/dL (ref 1.7–2.4)

## 2018-08-03 MED ORDER — LOPERAMIDE HCL 1 MG/7.5ML PO SUSP
4.0000 mg | Freq: Once | ORAL | Status: AC
  Administered 2018-08-03: 4 mg
  Filled 2018-08-03: qty 30

## 2018-08-03 MED ORDER — DIPHENHYDRAMINE HCL 12.5 MG/5ML PO ELIX
12.5000 mg | ORAL_SOLUTION | Freq: Once | ORAL | Status: AC
  Administered 2018-08-03: 12.5 mg via ORAL
  Filled 2018-08-03: qty 10

## 2018-08-03 MED ORDER — GUAIFENESIN-DM 100-10 MG/5ML PO SYRP
10.0000 mL | ORAL_SOLUTION | Freq: Four times a day (QID) | ORAL | Status: DC | PRN
  Administered 2018-08-03 – 2018-08-05 (×4): 10 mL
  Filled 2018-08-03 (×5): qty 10

## 2018-08-03 MED ORDER — JEVITY 1.2 CAL PO LIQD
1000.0000 mL | ORAL | Status: DC
  Administered 2018-08-04 – 2018-08-05 (×5): 1000 mL
  Filled 2018-08-03 (×8): qty 1000

## 2018-08-03 MED ORDER — PRO-STAT SUGAR FREE PO LIQD
30.0000 mL | Freq: Two times a day (BID) | ORAL | Status: DC
  Administered 2018-08-03 – 2018-08-07 (×8): 30 mL
  Filled 2018-08-03 (×8): qty 30

## 2018-08-03 NOTE — Consult Note (Signed)
IV TEAM: Received consult for PIV. Noted R upper arm IV was kinked. Extension and dressing changed, blood return noted with flush.  Pt has bruising at site that corresponds to the tape imprint. Suggested RN change the bandaging on R upper arm to avoid wrapping arm above IV site, may be contributing to the bruising.

## 2018-08-03 NOTE — Progress Notes (Signed)
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 On 9/19 began to notice difficulty swallowing, not able to even swallow his own saliva.  Had been ordered Decadron but this was not given.  He was found by nursing staff unresponsive and in asystole.  He underwent 1 round of ACLS with successful return of spontaneous circulation. Tx to ICU for hypothermia protocol.  He was stabilized.  Extubated on 9/26.  Had to be reintubated on 9/29.  Extensive discussion with family.  They declined tracheostomy and PEG tube.  He was made DNR.  One-way extubation on 10/3.    Significant Hospital Events   9/16: Anterior cervical disc fusion 2/08: Asystolic arrest.  Estimated time to return of spontaneous circulation approximately 3 minutes.  Unresponsive not following commands postarrest so hypothermia protocol initiated.  Goal temperature met at: 1030 pm  9/20: Hemodynamically stable on low-dose norepinephrine 9/21: completed rewarming. Paralytic and sedation discontinued. 9/22: paralysis/sedation and vasopressor therapy discontinued yesterday. Restlessness developed last night. IV fentanyl started. Reqired iv labetalol to treat HTN. Has a h/o HTN. 9/24>> Developed fever Vanc/Zosyn started 9/25 weaned off sedation and pressors.  Weaning but copious secretions. 9/26>> Extubated to 4 L [place] 9/27>> Failed swallow, weaned to RA 9/29>> re-intubated 9/30>>>Spoke with family extensively, brother is POA, remains hypernatremic and slowly weaning.  No trach/peg.  One way extubation when optimized. 10/1>> Weaning on 12/5, transitioning off insulin gtt  08/01/2018 now off insulin drip.  Weaning well.   Awake and alert.  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 No hemorrhage, mass effect, or herniation 9/23 MRI cervical spine: No acute osseous or cord signal abnormality identified. Interval C3-C7 anterior cervical fusion and discectomy. Small prevertebral fluid collection extending into the right anterior neck compatible with postsurgical changes. No epidural collection. Mildly improved patency of the cervical spinal canal. Stable residual multilevel foraminal stenosis from uncovertebral and facet Hypertrophy. 9/23 MRI thoracic spine Mild thoracic spine spondylosis. No high-grade foraminal or canal Stenosis 9/19: CT brain noncontrasted: Negative CT cervical spine 9/19: Status post C3-C7 ACDF with expected postoperative changes.  No fracture or malalignment 2D echo 9/20 >>>Left ventricle: The cavity size was normal. There was severe concentric hypertrophy. Systolic function was vigorous. The estimated ejection fraction was in the range of 65% to 70%. Wall motion was normal; there were no regional wall motion abnormalities. Doppler parameters are consistent with abnormal left ventricular relaxation (grade 1 diastolic dysfunction). Doppler parameters are consistent with elevated mean left atrial filling pressure. - Mitral valve: Valve area by pressure half-time: 2.04 cm^2. - Left atrium: The atrium was mildly dilated. 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  He states that he wants to go home.  Denies any pain.  Seems to be somewhat distracted.  Denies any nausea or vomiting.   Objective:  Vital Signs  Vitals:   08/02/18 2359 08/03/18 0340 08/03/18 0500 08/03/18 0740  BP: (!) 134/52 (!) 159/54  (!) 135/47  Pulse: 76 81  88  Resp: 20   18  Temp: 99 F (37.2 C) 99.1 F (37.3 C)    TempSrc: Oral Oral    SpO2: 99% 99%    Weight:   111.5 kg   Height:        Intake/Output Summary (Last 24 hours) at 08/03/2018 1140 Last data filed at 08/03/2018 0500 Gross per 24 hour  Intake 440 ml  Output 1000 ml  Net -560 ml   Filed Weights   08/01/18 0402 08/02/18 0500 08/03/18 0500  Weight: 113.2 kg 112.9 kg 111.5 kg    General appearance: alert, distracted and no distress Head: Normocephalic, without obvious abnormality, atraumatic, Nasogastric feeding tube was noted Resp: Normal effort at rest.  Diminished air entry at the bases.  Coarse breath sounds.  No wheezing rales or rhonchi Cardio: regular rate and rhythm, S1, S2 normal, no murmur, click, rub or gallop GI: soft, non-tender; bowel sounds normal; no masses,  no organomegaly Extremities: Edema noted bilateral lower extremities Pulses: 2+ and symmetric Skin: Skin color, texture, turgor normal. No rashes or lesions Neurologic: Noted to be awake alert.  Moving all his extremities.  Somewhat distracted.  No obvious facial asymmetry noted.  Lab Results:  Data Reviewed: I have personally reviewed following labs and imaging studies  CBC: Recent Labs  Lab 07/31/18 0349 08/01/18 0353 08/01/18 0835 08/02/18 0451 08/03/18 0357  WBC 16.0* 11.3* 14.7* 12.6* 11.8*  NEUTROABS  --   --   --  9.2* 9.0*  HGB 12.1* 8.6* 11.6* 11.2* 11.8*  HCT 39.6 28.9* 38.4* 36.4* 38.0*  MCV 92.5 94.8 93.0 91.2  90.3  PLT 247 203 272 299 875    Basic Metabolic Panel: Recent Labs  Lab 07/29/18 0033  07/30/18 0905 07/31/18 0349 08/01/18 0353 08/02/18 0451 08/03/18 0357  NA 150*   < > 150* 149* 144 140 143  K 5.1   < > 3.9 3.8 3.2* 4.1 3.8  CL 112*   < > 113* 111 119* 105 104  CO2 25   < > 30 33* '25 31 31  '$ GLUCOSE 260*   < > 269* 151* 150* 215* 187*  BUN 42*   < > 38* 38* 30* 35* 26*  CREATININE 1.48*   < > 1.21 1.16 0.90 1.00 0.94  CALCIUM 9.0   < > 7.5* 7.9* 5.8* 7.9* 8.1*  MG 2.5*  --  2.5* 2.4  --  2.2 2.0  PHOS 7.0*  --   --  2.8  --  3.1 2.5   < > = values in this interval not displayed.    GFR: Estimated Creatinine Clearance: 80.9 mL/min (by C-G formula based on SCr of 0.94 mg/dL).  Liver Function Tests: Recent Labs  Lab 07/28/18 0250 07/29/18 0330  AST 62* 549*  ALT 143* 528*  ALKPHOS 117 128*  BILITOT 0.8 0.6  PROT 6.7 5.4*  ALBUMIN 2.8* 2.3*    Cardiac Enzymes: Recent Labs  Lab 07/29/18 0033 07/29/18 0330 07/29/18 1254  TROPONINI 0.07* 0.09* 0.07*    CBG: Recent Labs  Lab 08/02/18 1129 08/02/18 1549 08/02/18 2356 08/03/18 0336 08/03/18 0739  GLUCAP 190*  186* 218* 166* 149*     Recent Results (from the past 240 hour(s))  Culture, respiratory (non-expectorated)     Status: None   Collection Time: 07/29/18  1:02 AM  Result Value Ref Range Status   Specimen Description TRACHEAL ASPIRATE  Final   Special Requests NONE  Final   Gram Stain   Final    MODERATE WBC PRESENT, PREDOMINANTLY PMN FEW GRAM POSITIVE COCCI FEW GRAM POSITIVE RODS    Culture   Final    MODERATE Consistent with normal respiratory flora. Performed at [facility], [place] [address], [place], [place] [address]    Report Status 08/01/2018 FINAL  Final      Radiology Studies: Dg Chest Port 1 View  Result Date: 08/02/2018 CLINICAL DATA:  New onset chest pain. EXAM: PORTABLE CHEST 1 VIEW COMPARISON:  08/01/2018 FINDINGS: The ET tube tip is above the carina. Enteric tube  tip is below the field of view. Heart size normal. Bibasilar atelectasis identified, unchanged. No airspace opacities, pulmonary edema or pleural effusion noted. IMPRESSION: 1. Persistent bibasilar atelectasis. Electronically Signed   By: Kerby Moors M.D.   On: 08/02/2018 08:40     Medications:  Scheduled: . dicyclomine  10 mg Per Tube BID  . enoxaparin (LOVENOX) injection  40 mg Subcutaneous Q24H  . famotidine  20 mg Per Tube Daily  . feeding supplement (PRO-STAT SUGAR FREE 64)  30 mL Per Tube TID  . free water  300 mL Per Tube Q8H  . hydrALAZINE  25 mg Oral Q8H  . insulin aspart  0-20 Units Subcutaneous Q4H  . insulin detemir  30 Units Subcutaneous BID  . loperamide HCl  4 mg Per Tube Once  . mouth rinse  15 mL Mouth Rinse BID  . polyvinyl alcohol  1 drop Both Eyes TID  . prednisoLONE acetate  2 drop Both Eyes TID AC & HS  . primidone  50 mg Per Tube BID  . rosuvastatin  20 mg Per Tube Once per day on Tue Thu   Continuous: . sodium chloride 10 mL/hr at 08/03/18 0034  . feeding supplement (VITAL HIGH PROTEIN) 1,000 mL (08/02/18 2157)  . lactated ringers Stopped (07/30/18 1259)  . levETIRAcetam 500 mg (08/03/18 1030)   RCV:ELFYB/OFBPZWCH arterial line **AND** sodium chloride, erythromycin, fentaNYL (SUBLIMAZE) injection, guaiFENesin-dextromethorphan    Assessment/Plan:  Status post cardiac arrest Etiology unclear.  Patient was seen by cardiology.  Supportive care was recommended.  Patient was successfully resuscitated.  Was intubated in the intensive care unit.  Failed initial extubation on 9/26 and had to be intubated again on 9/29.  Discussions with family.  No tracheostomy or PEG tube.  Seems to be stable from a cardiac standpoint.  History of coronary artery disease status post CABG Stable. He was on aspirin Plavix previously.  Was on propranolol as well.  His medications appear to be on hold currently.  Likely because he is n.p.o. status.  Chronic diastolic CHF Appears  to be stable from a volume standpoint.  Echocardiogram was done on 9/20 and showed normal systolic function.  Patient was on diuretics prior to admission.  Acute respiratory failure with hypoxia When the extubation on 10/3.  Seems to be stable from a respiratory standpoint.  Oropharyngeal dysphagia Patient has a nasogastric feeding tube.  Speech therapy to continue to follow.  Aspiration pneumonia Appears to have completed treatment with antibiotics.  Remains at high risk for aspiration.  Speech therapy to continue to follow.  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  Imaging studies suggested hypoxic ischemic injury.  Noted to be distracted but no obvious focal neurological deficits noted.  Patient was seen by neurology previously.  EEG did not show any epileptiform activity.  However patient was continued on Keppra.  He was recommended to discontinue Keppra once patient is extubated.  We will continue to monitor the patient and consider discontinuing tomorrow if he remains stable.  Chronic kidney disease stage III Stable.  Continue to monitor urine output.  Diabetes mellitus type II Patient noted to be on long-acting insulin which is being continued.  Monitor CBGs.  SSI.  Status post anterior cervical disc fusion on 9/16 Neurosurgery has been following.  He is off of steroids.  Seems to be stable.  Normocytic anemia No evidence of overt bleeding.  Continue to monitor closely.  Diarrhea Abdomen is benign. Reason for loose stools not entirely clear.  Patient will be given Imodium x1.   DVT Prophylaxis: Lovenox    Code Status: DNR Family Communication: No family at bedside Disposition Plan: Management as outlined above.  Inpatient rehab recommended by physical therapy.  This will be ordered.  Palliative medicine consult for goals of care.    LOS: 21 days   [facility] Pager  [phone] 08/03/2018, 11:40 AM  If 7PM-7AM, please contact night-coverage at [email or web address], password [facility]

## 2018-08-03 NOTE — Progress Notes (Signed)
Nutrition Follow-up  DOCUMENTATION CODES:   Obesity unspecified  INTERVENTION:  Discontinue Vital High Protein.   Initiate Jevity 1.2 formula via Cortrak NGT at rate of 30 ml/hr and increase by 10 ml every 4 hours to goal rate of 60 ml/hr.   Provide 30 ml Prostat BID  Continue free water flushes of 300 ml TID. (MD to adjust as appropriate)  Tube feeding regimen to provide 1928 kcal, 110 grams of protein, 2066 ml water.   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 Pt suffered cardiac arrest on 9/19 and was resuscitated, intubated, transferred to ICU and started on TTM. PMH includes DM, CAD/CABG, chronic diastolic heart failure, CKD II/III  Pt declines PEG tube.   One way extubated 10/3. Pt continues on NPO status with tube feeding infusing. RD to modify tube feeding to meet nutrition needs as pt no longer on vent. Labs and medications reviewed.    Diet Order:   Diet Order            Diet NPO time specified  Diet effective now        Diet Carb Modified              EDUCATION NEEDS:   Not appropriate for education at this time  Skin:  Skin Assessment: Skin Integrity Issues: Skin Integrity Issues:: DTI, Incisions DTI: lip Incisions: neck  Last BM:  10/2  Height:   Ht Readings from Last 1 Encounters:  07/31/18 5\' 8"  (1.727 m)    Weight:   Wt Readings from Last 1 Encounters:  08/03/18 111.5 kg    Ideal Body Weight:  70 kg  BMI:  Body mass index is 37.38 kg/m.  Estimated Nutritional Needs:   Kcal:  1800-2000  Protein:  105-120 grams  Fluid:  >/= 1.8 L/day    Corrin Parker, MS, RD, LDN Pager # [phone] After hours/ weekend pager # [phone]

## 2018-08-03 NOTE — Progress Notes (Signed)
CSW following for discharge plan. Patient transferred to this CSW last night, this CSW to continue to follow for placement needs.  CSW to follow.  Laveda Abbe, [place] Clinical Social Worker [phone]

## 2018-08-03 NOTE — Progress Notes (Signed)
Patient has been anxious, restlessness, and agitated, weak coughing , seems having mucous congestion, wheezing and rhonchi breath sound, but denies of pain at this moment, pt has been requesting meds which will make him relaxed so that he can go fall asleep, thus ; paged oncall MD SS and received new orders Benadryl 12.5mg  and Robitussin DM 36ml, and just administered those meds, and will continue to monitor.

## 2018-08-03 NOTE — Consult Note (Signed)
Physical Medicine and Rehabilitation Consult Reason for Consult: Decreased functional mobility Referring Physician: [facility]   HPI: Edward [name] is a 77 y.o. right-handed male with history of CAD with CABG, chronic diastolic congestive heart failure, CKD stage II, diabetes mellitus, hypertension.  Per chart review patient lives alone.  Reported to be independent prior to admission.  He has a brother in the area with limited assistance.  Presented 07/10/2018 with neck pain right arm numbness and weakness.  MRI of the brain negative.  MRI cervical spine showed mild right C5-6 as well as left C3-4 and C4-5 facet effusions.  Moderate cervical spondylosis as well as C3-4 through C6-7 canal stenosis and severe foraminal stenosis C4-5 and C5-6.  Underwent anterior cervical discectomy 07/16/2018 per Dr. Venetia Constable.  Postoperative course cardiac arrest on anticipated discharge date of 07/19/2018 and required intubation.  EEG negative for seizure.  Echocardiogram with ejection fraction of 97% grade 1 diastolic dysfunction.  He sustained another cardiac arrest 07/29/2018 received epi x3 developed V. fib requiring shock.  Patient remained intubated until 08/02/2018.  Currently on subtends Lovenox for DVT prophylaxis.  He is n.p.o. with alternative means of nutritional support.   Review of Systems  Unable to perform ROS: Acuity of condition   Past Medical History:  Diagnosis Date  . Adenomatous colon polyp 04/2011  . CAD (coronary artery disease)    a. 01/2015 DES to LAD  b. 12/2015: [place] 85% oLCx lesion--> rx therapy.  . Chronic diastolic CHF (congestive heart failure) ([place])    a. 05/2017 Echo: EF 60-65%, Gr1 DD, no rwma, mildly dil LA, nl RV fxn, mild TR.  . CKD (chronic kidney disease), stage II    a. probable CKD II-III with baseline CR 1.1-1.3.  . DM type 2 (diabetes mellitus, type 2), insulin dependent 01/29/2014   fasting cbg 50-120 with new regimen  . Dyslipidemia, goal LDL below 70 01/29/2014  .  Enlarged prostate   . Essential tremor    a. on proprnolol  . GERD (gastroesophageal reflux disease)   . Hx of CABG   . Hypertension   . Internal hemorrhoid   . Sleep apnea    does not use cpap (06/04/2015)  . TIA (transient ischemic attack) 2002   Past Surgical History:  Procedure Laterality Date  . ANTERIOR CERVICAL DECOMPRESSION/DISCECTOMY FUSION 4 LEVELS N/A 07/16/2018   Procedure: CERVICAL THREE-FOUR CERVICAL FOUR-FIVE CERVICAL FIVE-SIX CERVICAL SIX-SEVEN ANTERIOR CERVICAL DECOMPRESSION/DISCECTOMY/FUSION;  Surgeon: Judith Part, MD;  Location: [place];  Service: Neurosurgery;  Laterality: N/A;  CERVICAL THREE-FOUR CERVICAL FOUR-FIVE CERVICAL FIVE-SIX CERVICAL SIX-SEVEN ANTERIOR CERVICAL DECOMPRESSION/DISCECTOMY/FUSION  . BACK SURGERY    . CARDIAC CATHETERIZATION  01/28/14   + CAD treat medically  . CARDIAC CATHETERIZATION N/A 12/21/2015   Procedure: Right/Left Heart Cath and Coronary Angiography;  Surgeon: Belva Crome, MD;  Location: [place] [facility];  Service: Cardiovascular;  Laterality: N/A;  . CARDIAC CATHETERIZATION N/A 11/02/2016   Procedure: Left Heart Cath and Coronary Angiography;  Surgeon: Nelva Bush, MD;  Location: [place] [facility];  Service: Cardiovascular;  Laterality: N/A;  . CATARACT EXTRACTION Bilateral   . CATARACT EXTRACTION, BILATERAL Bilateral   . CORONARY ANGIOPLASTY WITH STENT PLACEMENT  01/30/2015   DES [place] to LAD by Dr Tamala Julian  . CORONARY ARTERY BYPASS GRAFT N/A 11/09/2016   Procedure: CORONARY ARTERY BYPASS GRAFTING (CABG) x 2;  Surgeon: Melrose Nakayama, MD;  Location: [place];  Service: Open Heart Surgery;  Laterality: N/A;  .  ENDOVEIN HARVEST OF GREATER SAPHENOUS VEIN Left 11/09/2016   Procedure: ENDOVEIN HARVEST OF GREATER SAPHENOUS VEIN;  Surgeon: Melrose Nakayama, MD;  Location: [place];  Service: Open Heart Surgery;  Laterality: Left;  . JOINT REPLACEMENT    . LEFT HEART CATH AND CORS/GRAFTS ANGIOGRAPHY N/A 03/09/2018    Procedure: LEFT HEART CATH AND CORS/GRAFTS ANGIOGRAPHY;  Surgeon: Belva Crome, MD;  Location: [place] [facility];  Service: Cardiovascular;  Laterality: N/A;  . LEFT HEART CATHETERIZATION WITH CORONARY ANGIOGRAM N/A 01/28/2014   Procedure: LEFT HEART CATHETERIZATION WITH CORONARY ANGIOGRAM;  Surgeon: Sinclair Grooms, MD;  Location: [facility] C[facility];  Service: Cardiovascular;  Laterality: N/A;  . LEFT HEART CATHETERIZATION WITH CORONARY ANGIOGRAM N/A 01/30/2015   Procedure: LEFT HEART CATHETERIZATION WITH CORONARY ANGIOGRAM;  Surgeon: Belva Crome, MD;  Location: [facility] C[facility] LAB;  Service: Cardiovascular;  Laterality: N/A;  . LUMBAR [place]    . SHOULDER ARTHROSCOPY Right 07/30/2014   Procedure: Right Shoulder Arthroscopy, Debridement, Decompression, Manipulation Under Anesthesia;  Surgeon: Newt Minion, MD;  Location: [place];  Service: Orthopedics;  Laterality: Right;  . TEE WITHOUT CARDIOVERSION N/A 11/09/2016   Procedure: TRANSESOPHAGEAL ECHOCARDIOGRAM (TEE);  Surgeon: Melrose Nakayama, MD;  Location: [place];  Service: Open Heart Surgery;  Laterality: N/A;  . TOTAL KNEE ARTHROPLASTY Bilateral    Family History  Problem Relation Age of Onset  . CAD Brother        2 brothers - CABG  . Alzheimer's disease Sister   . CAD Sister   . Prostate cancer Brother   . Asthma Brother        2 brothers   . Diabetes Other        entire family  . Colon cancer Neg Hx    Social History:  reports that he has never smoked. He has never used smokeless tobacco. He reports that he does not drink alcohol or use drugs. Allergies:  Allergies  Allergen Reactions  . Pneumococcal Vaccines Anaphylaxis   Medications Prior to Admission  Medication Sig Dispense Refill  . amLODipine (NORVASC) 10 MG tablet Take 1 tablet (10 mg total) by mouth daily. 90 tablet 1  . bismuth subsalicylate (PEPTO BISMOL) 262 MG/15ML suspension Take 30 mLs by mouth as needed for indigestion.    . clonazePAM (KLONOPIN) 1 MG tablet Take 1  tablet (1 mg total) by mouth at bedtime. (Patient taking differently: Take 1 mg by mouth at bedtime as needed (for sleep or anxiety). ) 30 tablet 1  . cyclobenzaprine (FLEXERIL) 5 MG tablet Take 1 tablet (5 mg total) by mouth at bedtime as needed for muscle spasms (headache). (Patient taking differently: Take 5 mg by mouth at bedtime as needed (for muscle spasms, leg cramps, or headaches). ) 30 tablet 1  . diclofenac sodium (VOLTAREN) 1 % GEL Apply 2 g topically 4 (four) times daily as needed (for pain).     Marland Kitchen dicyclomine (BENTYL) 10 MG capsule Take 10 mg by mouth 2 (two) times daily.     Marland Kitchen erythromycin ophthalmic ointment Place 1 application into both eyes See admin instructions. Apply to both eyelids once a day as needed for itching    . esomeprazole (NEXIUM) 40 MG capsule Take 40 mg by mouth daily as needed (for reflux).     . furosemide (LASIX) 40 MG tablet Take 1.5 tablets (60 mg total) by mouth daily. 135 tablet 3  . gabapentin (NEURONTIN) 300 MG capsule take 1 capsule by mouth at  bedtime for 1 week then INCREASE TO 1 CAP TWICE DAILY (Patient taking differently: Take 300 mg by mouth 2 (two) times daily. ) 60 capsule 11  . hydrALAZINE (APRESOLINE) 25 MG tablet Take 1 tablet (25 mg total) by mouth 3 (three) times daily. 270 tablet 3  . insulin aspart (NOVOLOG) 100 UNIT/ML injection Inject 40 Units into the skin 3 (three) times daily before meals. 100 mL 1  . insulin NPH Human (NOVOLIN N RELION) 100 UNIT/ML injection Inject 30 Units into the skin 2 (two) times daily before a meal.     . meclizine (ANTIVERT) 25 MG tablet Take 1 tablet (25 mg total) by mouth 2 (two) times daily as needed for dizziness. 30 tablet 1  . nitroGLYCERIN (NITROSTAT) 0.4 MG SL tablet Place 1 tablet (0.4 mg total) under the tongue every 5 (five) minutes as needed for chest pain. Max: 3 doses per episode. (Patient taking differently: Place 0.4-0.8 mg under the tongue every 5 (five) minutes x 3 doses as needed for chest pain. ) 25  tablet 3  . oxyCODONE-acetaminophen (PERCOCET/ROXICET) 5-325 MG tablet Take 1-2 tablets by mouth every 4 (four) hours as needed for moderate pain or severe pain. 12 tablet 0  . polyethylene glycol (MIRALAX / GLYCOLAX) packet Take 17 g by mouth daily. (Patient taking differently: Take 17 g by mouth daily as needed for mild constipation. ) 14 each 0  . Polyvinyl Alcohol-Povidone (REFRESH OP) Place 1 drop into both eyes 3 (three) times daily as needed (for dry eyes). Reported on 12/25/2015    . potassium chloride SA (K-DUR,KLOR-CON) 20 MEQ tablet TAKE 1 & 1/2 (ONE & ONE-HALF) TABLETS BY MOUTH ONCE DAILY (Patient taking differently: Take 30 mEq by mouth daily. ) 135 tablet 3  . prednisoLONE acetate (PRED FORTE) 1 % ophthalmic suspension Place 2 drops into both eyes 2 (two) times daily as needed for itching.    . primidone (MYSOLINE) 50 MG tablet Take 1 tablet (50 mg total) by mouth 2 (two) times daily. 60 tablet 11  . propranolol (INDERAL) 40 MG tablet Take 1 tablet (40 mg total) by mouth 3 (three) times daily. 270 tablet 3  . rosuvastatin (CRESTOR) 20 MG tablet Take 1 tablet (20 mg total) by mouth daily. (Patient taking differently: Take 20 mg by mouth 2 (two) times a week. ) 90 tablet 3  . albuterol (VENTOLIN HFA) 108 (90 Base) MCG/ACT inhaler inhale 2 puffs by mouth every 6 hours if needed for wheezing or shortness of breath (Patient not taking: Reported on 07/13/2018) 18 g 1  . aspirin EC 81 MG tablet Take 1 tablet (81 mg total) by mouth daily. Hold aspirin for 5 days for the surgery, restart it as per neuro surgery.    . clopidogrel (PLAVIX) 75 MG tablet Take 1 tablet (75 mg total) by mouth daily. Hold plavix for the surgery, restart it as per neuro surgery. 90 tablet 0  . Insulin Syringe-Needle U-100 (BD INSULIN SYRINGE U/F) 31G X 5/16" 1 ML MISC Use 4 times per day to inject insulin. 300 each 5  . senna-docusate (SENOKOT-S) 8.6-50 MG tablet Take 2 tablets by mouth 2 (two) times daily. 30 tablet 0     Home: Home Living Family/patient expects to be discharged to:: Skilled nursing facility Living Arrangements: Alone Available Help at Discharge: Family, Available PRN/intermittently Type of Home: House Home Access: Stairs to enter [employer] of Steps: 3 steps Entrance Stairs-Rails: Right Home Layout: One level Bathroom Shower/Tub: Tub/shower unit, Walk-in shower  Bathroom Toilet: Standard Home Equipment: Environmental consultant - 4 wheels, Cane - single point, Shower seat Additional Comments: brother could assist in a pinch, but will be more busy after this surgery.  Functional History: Prior Function Level of Independence: Independent Comments: Enjoys church Functional Status:  Mobility: Bed Mobility Overal bed mobility: Needs Assistance Bed Mobility: Rolling, Sidelying to Sit, Sit to Sidelying Rolling: Mod assist Sidelying to sit: Max assist, +2 for physical assistance Sit to sidelying: Max assist, +2 for physical assistance General bed mobility comments: Patient able to perform limited left knee flexion and reach with arm towards contralateral side; overall mod assist for rolling towards right and then max assist provided at trunk and with bed pad to bring to edge of bed.  Transfers Overall transfer level: Needs assistance Equipment used: Rolling walker (2 wheeled) Transfers: Sit to/from Stand Sit to Stand: From elevated surface, Max assist, +2 physical assistance Stand pivot transfers: Max assist, +2 physical assistance General transfer comment: Patient performed x3 partial sit to stands with maxA + 2 in order to bring hips up towards head of bed. Unable to achieve complete upright positioning. Tactile/verbal cueing for hand and feet placement. Deferred transfer to chair since patient was being transferred to another room  Ambulation/Gait Ambulation/Gait assistance: Min guard Gait Distance (Feet): 300 Feet Assistive device: Rolling walker (2 wheeled) Gait Pattern/deviations:  Step-through pattern General Gait Details: steady, but tentative.  Gets outside the [place] during turns. Gait velocity: decreased  Gait velocity interpretation: 1.31 - 2.62 ft/sec, indicative of limited community ambulator    ADL: ADL Overall ADL's : Needs assistance/impaired Eating/Feeding: NPO Grooming: Wash/dry face, Maximal assistance, Bed level Grooming Details (indicate cue type and reason): hand over hand with RUE Upper Body Bathing: Maximal assistance, Bed level Lower Body Bathing: Total assistance, Bed level Upper Body Dressing : Total assistance, Bed level Lower Body Dressing: Total assistance, Bed level Toilet Transfer: Moderate assistance, RW Toilet Transfer Details (indicate cue type and reason): sit to stand with walker and urinal Toileting- Clothing Manipulation and Hygiene: Moderate assistance, Sit to/from stand, Cueing for sequencing, Cueing for safety General ADL Comments: Can perform hand to mouth and rotate head from vent to midline.  Cognition: Cognition Overall Cognitive Status: Impaired/Different from baseline Orientation Level: Oriented X4, Disoriented to time Cognition Arousal/Alertness: Awake/alert Behavior During Therapy: WFL for tasks assessed/performed Overall Cognitive Status: Impaired/Different from baseline Area of Impairment: Attention, Following commands, Safety/judgement, Problem solving Orientation Level: Disoriented to, Time, Situation, Place Current Attention Level: Focused Memory: Decreased recall of precautions, Decreased short-term memory Following Commands: Follows one step commands inconsistently, Follows one step commands with increased time Safety/Judgement: Decreased awareness of safety, Decreased awareness of deficits Problem Solving: Slow processing, Decreased initiation, Difficulty sequencing, Requires verbal cues, Requires tactile cues General Comments: Patient very pleasant and eager to participate in therapy. Recently extubated,  mainly mouthing words and using gestures but was able to whisper occasionally.  Difficult to assess due to: Intubated  Blood pressure (!) 135/47, pulse 88, temperature 99.1 F (37.3 C), temperature source Oral, resp. rate 18, height 5\' 8"  (1.727 m), weight 111.5 kg, SpO2 99 %. Physical Exam  Constitutional: No distress.  obese  HENT:  Head: Normocephalic.  Eyes: Pupils are equal, round, and reactive to light.  Neck: Normal range of motion.  Cardiovascular: Normal rate.  Respiratory: Effort normal.  GI: Soft.  Neurological:  Patient is restless.  Exam limited by patient's lack of participation.  He did follow some simple inconsistent commands. Moves all 4's and withdraws  to pain.   Psychiatric:  Restless, confused    Results for orders placed or performed during the hospital encounter of 07/13/18 (from the past 24 hour(s))  Glucose, capillary     Status: Abnormal   Collection Time: 08/02/18  3:49 PM  Result Value Ref Range   Glucose-Capillary 186 (H) 70 - 99 mg/dL   Comment 1 Notify RN   Glucose, capillary     Status: Abnormal   Collection Time: 08/02/18 11:56 PM  Result Value Ref Range   Glucose-Capillary 218 (H) 70 - 99 mg/dL   Comment 1 Notify RN    Comment 2 Document in Chart   Glucose, capillary     Status: Abnormal   Collection Time: 08/03/18  3:36 AM  Result Value Ref Range   Glucose-Capillary 166 (H) 70 - 99 mg/dL   Comment 1 Notify RN    Comment 2 Document in Chart   CBC with Differential/Platelet     Status: Abnormal   Collection Time: 08/03/18  3:57 AM  Result Value Ref Range   WBC 11.8 (H) 4.0 - 10.5 K/uL   RBC 4.21 (L) 4.22 - 5.81 MIL/uL   Hemoglobin 11.8 (L) 13.0 - 17.0 g/dL   HCT 38.0 (L) 39.0 - 52.0 %   MCV 90.3 78.0 - 100.0 fL   MCH 28.0 26.0 - 34.0 pg   MCHC 31.1 30.0 - 36.0 g/dL   RDW 13.2 11.5 - 15.5 %   Platelets 317 150 - 400 K/uL   Neutrophils Relative % 77 %   Neutro Abs 9.0 (H) 1.7 - 7.7 K/uL   Lymphocytes Relative 12 %   Lymphs Abs 1.5  0.7 - 4.0 K/uL   Monocytes Relative 9 %   Monocytes Absolute 1.0 0.1 - 1.0 K/uL   Eosinophils Relative 2 %   Eosinophils Absolute 0.3 0.0 - 0.7 K/uL   Basophils Relative 0 %   Basophils Absolute 0.0 0.0 - 0.1 K/uL   Immature Granulocytes 0 %   Abs Immature Granulocytes 0.1 0.0 - 0.1 K/uL  Basic metabolic panel     Status: Abnormal   Collection Time: 08/03/18  3:57 AM  Result Value Ref Range   Sodium 143 135 - 145 mmol/L   Potassium 3.8 3.5 - 5.1 mmol/L   Chloride 104 98 - 111 mmol/L   CO2 31 22 - 32 mmol/L   Glucose, Bld 187 (H) 70 - 99 mg/dL   BUN 26 (H) 8 - 23 mg/dL   Creatinine, Ser 0.94 0.61 - 1.24 mg/dL   Calcium 8.1 (L) 8.9 - 10.3 mg/dL   GFR calc non Af Amer >60 >60 mL/min   GFR calc Af Amer >60 >60 mL/min   Anion gap 8 5 - 15  Magnesium     Status: None   Collection Time: 08/03/18  3:57 AM  Result Value Ref Range   Magnesium 2.0 1.7 - 2.4 mg/dL  Phosphorus     Status: None   Collection Time: 08/03/18  3:57 AM  Result Value Ref Range   Phosphorus 2.5 2.5 - 4.6 mg/dL  Glucose, capillary     Status: Abnormal   Collection Time: 08/03/18  7:39 AM  Result Value Ref Range   Glucose-Capillary 149 (H) 70 - 99 mg/dL   Comment 1 QC Due    Dg Chest Port 1 View  Result Date: 08/02/2018 CLINICAL DATA:  New onset chest pain. EXAM: PORTABLE CHEST 1 VIEW COMPARISON:  08/01/2018 FINDINGS: The ET tube tip is above the carina.  Enteric tube tip is below the field of view. Heart size normal. Bibasilar atelectasis identified, unchanged. No airspace opacities, pulmonary edema or pleural effusion noted. IMPRESSION: 1. Persistent bibasilar atelectasis. Electronically Signed   By: Kerby Moors M.D.   On: 08/02/2018 08:40     Assessment/Plan: Diagnosis: Functional deficits secondary to cervical stenosis s/p ACDF. 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 Does the need for close, 24 hr/day medical supervision in concert with the patient's rehab needs make it  unreasonable for this patient to be served in a less intensive setting? Yes and Potentially 2. Co-Morbidities requiring supervision/potential complications: morbid obesity, DM2, CKD, CAD, HTN 3. Due to bladder management, bowel management, safety, skin/wound care, disease management, medication administration, pain management and patient education, does the patient require 24 hr/day rehab nursing? Yes 4. Does the patient require coordinated care of a physician, rehab nurse, PT (1-2 hrs/day, 5 days/week), OT (1-2 hrs/day, 5 days/week) and SLP (1-2 hrs/day, 5 days/week) to address physical and functional deficits in the context of the above medical diagnosis(es)? Yes Addressing deficits in the following areas: balance, endurance, locomotion, strength, transferring, bowel/bladder control, bathing, dressing, feeding, grooming and psychosocial support 5. Can the patient actively participate in an intensive therapy program of at least 3 hrs of therapy per day at least 5 days per week? Yes and Potentially 6. The potential for patient to make measurable gains while on inpatient rehab is good 7. Anticipated functional outcomes upon discharge from inpatient rehab are supervision  with PT, supervision and min assist with OT, supervision with SLP. 8. Estimated rehab length of stay to reach the above functional goals is: ?13-20 days 9. Anticipated D/C setting: Home 10. Anticipated post D/C treatments: [facility] therapy 11. Overall Rehab/Functional Prognosis: good  RECOMMENDATIONS: This patient's condition is appropriate for continued rehabilitative care in the following setting: CIR potentially Patient has agreed to participate in recommended program. N/A Note that insurance prior authorization may be required for reimbursement for recommended care.  Comment: Pt will need to demonstrate capability of cognitively participating in therapy. Who will provide assistance once home?Rehab Admissions Coordinator to follow  up.  Thanks,  Meredith Staggers, MD, Mellody Drown  I have personally performed a face to face diagnostic evaluation of this patient. Additionally, I have reviewed and concur with the physician assistant's documentation above.    Lavon Paganini Angiulli, PA-C 08/03/2018

## 2018-08-03 NOTE — Progress Notes (Signed)
[facility] Progress Note Patient Name: Edward [name] DOB: January 05, 1941 MRN: [id number]   Date of Service  08/03/2018  HPI/Events of Note  Patient requests medication for sleep and cough.   eICU Interventions  Will order: 1. Benadryl elixir 12.5 mg per tube now.  2. Robitussin DM 10 mL per tube Q 6 hours PRN cough.      Intervention Category Major Interventions: Other:  [name] 08/03/2018, 2:23 AM

## 2018-08-03 NOTE — Consult Note (Signed)
   [facility] CM Inpatient Consult   08/03/2018  Edward [name] [birth year] [id number]    Patient screened for potential [facility] Care Management services due to unplanned readmission risk score of 47% (extreme).  Spoke with inpatient RNCM. Noted inpatient rehab consult. Discussed that writer will continue to follow along for disposition plans and progression.   Marthenia Rolling, MSN-Ed, RN,BSN [facility] Liaison [phone]

## 2018-08-03 NOTE — Evaluation (Signed)
Clinical/Bedside Swallow Evaluation Patient Details  Name: Edward [name] MRN: [id number] Date of Birth: November 13, 1940  Today's Date: 08/03/2018 Time: SLP Start Time (ACUTE ONLY): 1450 SLP Stop Time (ACUTE ONLY): 1510 SLP Time Calculation (min) (ACUTE ONLY): 20 min  Past Medical History:  Past Medical History:  Diagnosis Date  . Adenomatous colon polyp 04/2011  . CAD (coronary artery disease)    a. 01/2015 DES to LAD  b. 12/2015: [place] 85% oLCx lesion--> rx therapy.  . Chronic diastolic CHF (congestive heart failure) ([place])    a. 05/2017 Echo: EF 60-65%, Gr1 DD, no rwma, mildly dil LA, nl RV fxn, mild TR.  . CKD (chronic kidney disease), stage II    a. probable CKD II-III with baseline CR 1.1-1.3.  . DM type 2 (diabetes mellitus, type 2), insulin dependent 01/29/2014   fasting cbg 50-120 with new regimen  . Dyslipidemia, goal LDL below 70 01/29/2014  . Enlarged prostate   . Essential tremor    a. on proprnolol  . GERD (gastroesophageal reflux disease)   . Hx of CABG   . Hypertension   . Internal hemorrhoid   . Sleep apnea    does not use cpap (06/04/2015)  . TIA (transient ischemic attack) 2002   Past Surgical History:  Past Surgical History:  Procedure Laterality Date  . ANTERIOR CERVICAL DECOMPRESSION/DISCECTOMY FUSION 4 LEVELS N/A 07/16/2018   Procedure: CERVICAL THREE-FOUR CERVICAL FOUR-FIVE CERVICAL FIVE-SIX CERVICAL SIX-SEVEN ANTERIOR CERVICAL DECOMPRESSION/DISCECTOMY/FUSION;  Surgeon: Judith Part, MD;  Location: [facility];  Service: Neurosurgery;  Laterality: N/A;  CERVICAL THREE-FOUR CERVICAL FOUR-FIVE CERVICAL FIVE-SIX CERVICAL SIX-SEVEN ANTERIOR CERVICAL DECOMPRESSION/DISCECTOMY/FUSION  . BACK SURGERY    . CARDIAC CATHETERIZATION  01/28/14   + CAD treat medically  . CARDIAC CATHETERIZATION N/A 12/21/2015   Procedure: Right/Left Heart Cath and Coronary Angiography;  Surgeon: Belva Crome, MD;  Location: [facility];  Service: Cardiovascular;  Laterality: N/A;  . CARDIAC  CATHETERIZATION N/A 11/02/2016   Procedure: Left Heart Cath and Coronary Angiography;  Surgeon: Nelva Bush, MD;  Location: [facility];  Service: Cardiovascular;  Laterality: N/A;  . CATARACT EXTRACTION Bilateral   . CATARACT EXTRACTION, BILATERAL Bilateral   . CORONARY ANGIOPLASTY WITH STENT PLACEMENT  01/30/2015   DES [place]  [place] to LAD by Dr Tamala Julian  . CORONARY ARTERY BYPASS GRAFT N/A 11/09/2016   Procedure: CORONARY ARTERY BYPASS GRAFTING (CABG) x 2;  Surgeon: Melrose Nakayama, MD;  Location: [facility];  Service: Open Heart Surgery;  Laterality: N/A;  . ENDOVEIN HARVEST OF GREATER SAPHENOUS VEIN Left 11/09/2016   Procedure: ENDOVEIN HARVEST OF GREATER SAPHENOUS VEIN;  Surgeon: Melrose Nakayama, MD;  Location: [facility];  Service: Open Heart Surgery;  Laterality: Left;  . JOINT REPLACEMENT    . LEFT HEART CATH AND CORS/GRAFTS ANGIOGRAPHY N/A 03/09/2018   Procedure: LEFT HEART CATH AND CORS/GRAFTS ANGIOGRAPHY;  Surgeon: Belva Crome, MD;  Location: [facility];  Service: Cardiovascular;  Laterality: N/A;  . LEFT HEART CATHETERIZATION WITH CORONARY ANGIOGRAM N/A 01/28/2014   Procedure: LEFT HEART CATHETERIZATION WITH CORONARY ANGIOGRAM;  Surgeon: Sinclair Grooms, MD;  Location: [facility];  Service: Cardiovascular;  Laterality: N/A;  . LEFT HEART CATHETERIZATION WITH CORONARY ANGIOGRAM N/A 01/30/2015   Procedure: LEFT HEART CATHETERIZATION WITH CORONARY ANGIOGRAM;  Surgeon: Belva Crome, MD;  Location: [facility];  Service: Cardiovascular;  Laterality: N/A;  . LUMBAR [place]    . SHOULDER ARTHROSCOPY Right 07/30/2014   Procedure:  Right Shoulder Arthroscopy, Debridement, Decompression, Manipulation Under Anesthesia;  Surgeon: Newt Minion, MD;  Location: [facility];  Service: Orthopedics;  Laterality: Right;  . TEE WITHOUT CARDIOVERSION N/A 11/09/2016   Procedure: TRANSESOPHAGEAL ECHOCARDIOGRAM (TEE);  Surgeon: Melrose Nakayama, MD;  Location: [facility];  Service: Open Heart  Surgery;  Laterality: N/A;  . 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  On 9/19 began to notice difficulty swallowing and dysphonia, not able to even swallow his own saliva.  Had been ordered Decadron but this was not given.  He was found by nursing staff unresponsive and in asystole.  He underwent 1 round of ACLS with successful return of spontaneous circulation after 3 minutes. Possible anoxic injury. Iintubated from 9/19 to 9/26. CT shows no hematoma, but does show soft tissue swelling in pharyngeal wall. SLP evalauted pt on 9/27, pt not ready for POs, remained NPO and Cortrak placed. Pt reintubated on 9/30 and extubated on 10/3 with new DNR status.    Assessment / Plan / Recommendation Clinical Impression  Pt again demonstrates impaired airway protection with suspected audible standing pharyngeal secretions and weak mechanism to clear or expectoration. Pts vocal quality is hoarse and there are signs of generalized deconditioning and weakness. These findings combined with known dysphagia following ACDF and second trial of intubated put the pt at high risk of aspiration. Though confused pt is alert and able to follow simple commands, can initiate a swallow. Progonsis for return of swallow function over the next week is good, but pt will need more time post extubation to recover glottic compentency and cough strength prior to attempting MBS or any PO. Aware that pts code status has changed, if there is no evidence of clinical improvment over the short term, will address comfort feeding. Recommend alternate means of nutrition over the weekend with f/u Monday for PO trials and potential MBS.  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  On 9/19 began to notice difficulty swallowing and dysphonia, not able to even swallow his own saliva.  Had been ordered Decadron but this was not given.  He was found by nursing staff unresponsive and in asystole.  He underwent 1 round of ACLS with successful return of spontaneous circulation after 3 minutes. Possible anoxic injury. Iintubated from 9/19 to 9/26. CT shows no hematoma, but does show soft tissue swelling in pharyngeal wall. SLP evalauted pt on 9/27, pt not ready for POs, remained NPO and Cortrak placed. Pt reintubated on 9/30 and extubated on 10/3 with new DNR status.  Type of Study: Bedside Swallow Evaluation Previous Swallow Assessment: see HPI Diet Prior to this Study: Dysphagia 1 (puree);NPO Respiratory Status: Nasal cannula History of Recent Intubation:  No Length of Intubations (days): 10 days(7;3) Date extubated: 08/02/18 Behavior/Cognition: Alert;Cooperative;Confused Self-Feeding Abilities: Needs assist Patient Positioning: Upright in bed Baseline Vocal Quality: Hoarse;Breathy;Low vocal intensity Volitional Cough: Congested Volitional Swallow: Able to elicit    Oral/Motor/Sensory Function Overall Oral Motor/Sensory Function: Generalized oral weakness   Ice Chips Ice chips: Impaired Pharyngeal Phase Impairments: Throat Clearing - Immediate;Wet Vocal Quality;Multiple swallows   Thin Liquid Thin Liquid: Not tested    Nectar Thick Nectar Thick Liquid: Not tested   Honey Thick Honey Thick  Liquid: Not tested   Puree Puree: Not tested   Solid     Solid: Not tested     [name], MA CCC-SLP  Acute Rehabilitation Services Pager [phone] Office [phone]  Terris Bodin, Katherene Ponto 08/03/2018,3:33 PM

## 2018-08-03 NOTE — Progress Notes (Signed)
Physical Therapy Treatment Patient Details Name: JUANITA DEVINCENT MRN: [id number] DOB: Dec 30, 1940 Today's Date: 08/03/2018    History of Present Illness pt is a 77 y/o male admitted with neck pain and arm weakness s/p C3-7 ACDF on 9/16. Pt with aystole on 9/19, VDRF 9/19-9/26. PMhx: CAD, CKD, DM2, CABG, HTN, He sustained another cardiac arrest/PEA 9/29, received epi x3 and then developed VF requiring shock, total downtime documented as 7 minutes but head CT concerning of loss of gray and white matter. Reintubated 9/29. Extubated 10/3.     PT Comments    Patient tired today but eager to work with therapy. Requires max A of 2 to get to EOB, but able to initiate movement. Pt with posterior bias and LOB sitting EOB during dynamic tasks requiring external support at times. Attempted standing with stedy but unable to clear bottom today. Fatigues pretty quickly. Difficult to understand at times as pt whispering/raspy voice. Would really benefit from vital go bed. Will request order to be placed to safely progress patient. Will follow.    Follow Up Recommendations  CIR;Supervision/Assistance - 24 hour     Equipment Recommendations  None recommended by PT    Recommendations for Other Services       Precautions / Restrictions Precautions Precautions: Fall;Cervical Restrictions Weight Bearing Restrictions: No    Mobility  Bed Mobility Overal bed mobility: Needs Assistance Bed Mobility: Rolling;Sidelying to Sit;Sit to Sidelying Rolling: Mod assist Sidelying to sit: Max assist;+2 for physical assistance     Sit to sidelying: Max assist;+2 for physical assistance General bed mobility comments: Initiated bringing LEs to EOB, able to reach for rail, assist with trunk and to scoot bottom to get to EOB, posterior bias.   Transfers Overall transfer level: Needs assistance Equipment used: (stedy) Transfers: Sit to/from Stand Sit to Stand: From elevated surface;Max assist;+2 physical  assistance         General transfer comment: Attempted to stand using stedy however pt not able to clear bottom from bed x3 despite using UEs to pull up on stedy. Deferred to transfer to chair as pt fatigued after standing bouts.  Ambulation/Gait                 Stairs             Wheelchair Mobility    Modified Rankin (Stroke Patients Only)       Balance Overall balance assessment: Needs assistance Sitting-balance support: Feet supported;No upper extremity supported Sitting balance-Leahy Scale: Poor Sitting balance - Comments: LOB posteriorly during dynamic tasks, requires UE support and external support. Postural control: Posterior lean                                  Cognition Arousal/Alertness: Awake/alert Behavior During Therapy: WFL for tasks assessed/performed Overall Cognitive Status: Difficult to assess Area of Impairment: Attention;Following commands;Safety/judgement;Problem solving                   Current Attention Level: Focused   Following Commands: Follows one step commands inconsistently;Follows one step commands with increased time Safety/Judgement: Decreased awareness of safety;Decreased awareness of deficits   Problem Solving: Slow processing;Decreased initiation;Difficulty sequencing;Requires verbal cues;Requires tactile cues General Comments: Whispering words, difficult to understand at times. Eager to work with therapy.       Exercises      General Comments        Pertinent Vitals/Pain Pain Assessment: Faces Faces  Pain Scale: Hurts little more Pain Location: chest Pain Descriptors / Indicators: (pointed to chest) Pain Intervention(s): Monitored during session;Repositioned;Patient requesting pain meds-RN notified    Home Living                      Prior Function            PT Goals (current goals can now be found in the care plan section) Progress towards PT goals: Not progressing toward  goals - comment(tired)    Frequency    Min 3X/week      PT Plan Current plan remains appropriate    Co-evaluation              AM-PAC PT "6 Clicks" Daily Activity  Outcome Measure  Difficulty turning over in bed (including adjusting bedclothes, sheets and blankets)?: Unable Difficulty moving from lying on back to sitting on the side of the bed? : Unable Difficulty sitting down on and standing up from a chair with arms (e.g., wheelchair, bedside commode, etc,.)?: Unable Help needed moving to and from a bed to chair (including a wheelchair)?: Total Help needed walking in hospital room?: Total Help needed climbing 3-5 steps with a railing? : Total 6 Click Score: 6    End of Session Equipment Utilized During Treatment: Gait belt;Oxygen Activity Tolerance: Patient limited by fatigue Patient left: in bed;with call bell/phone within reach;with bed alarm set Nurse Communication: Mobility status PT Visit Diagnosis: Other abnormalities of gait and mobility (R26.89);Muscle weakness (generalized) (M62.81);Unsteadiness on feet (R26.81);Other symptoms and signs involving the nervous system (R29.898)     Time: 1410-3013 PT Time Calculation (min) (ACUTE ONLY): 24 min  Charges:  $Therapeutic Activity: 23-37 mins                     Wray Kearns, [place], DPT Acute Rehabilitation Services Pager [phone] Office [place] 08/03/2018, 4:17 PM

## 2018-08-04 DIAGNOSIS — R945 Abnormal results of liver function studies: Secondary | ICD-10-CM

## 2018-08-04 LAB — CBC
HEMATOCRIT: 36.7 % — AB (ref 39.0–52.0)
Hemoglobin: 11.3 g/dL — ABNORMAL LOW (ref 13.0–17.0)
MCH: 28.3 pg (ref 26.0–34.0)
MCHC: 30.8 g/dL (ref 30.0–36.0)
MCV: 91.8 fL (ref 78.0–100.0)
PLATELETS: 254 10*3/uL (ref 150–400)
RBC: 4 MIL/uL — ABNORMAL LOW (ref 4.22–5.81)
RDW: 13.4 % (ref 11.5–15.5)
WBC: 10.6 10*3/uL — ABNORMAL HIGH (ref 4.0–10.5)

## 2018-08-04 LAB — GLUCOSE, CAPILLARY
GLUCOSE-CAPILLARY: 133 mg/dL — AB (ref 70–99)
GLUCOSE-CAPILLARY: 181 mg/dL — AB (ref 70–99)
Glucose-Capillary: 123 mg/dL — ABNORMAL HIGH (ref 70–99)
Glucose-Capillary: 135 mg/dL — ABNORMAL HIGH (ref 70–99)
Glucose-Capillary: 164 mg/dL — ABNORMAL HIGH (ref 70–99)
Glucose-Capillary: 176 mg/dL — ABNORMAL HIGH (ref 70–99)

## 2018-08-04 LAB — BASIC METABOLIC PANEL
Anion gap: 9 (ref 5–15)
BUN: 21 mg/dL (ref 8–23)
CALCIUM: 8.2 mg/dL — AB (ref 8.9–10.3)
CO2: 27 mmol/L (ref 22–32)
CREATININE: 0.88 mg/dL (ref 0.61–1.24)
Chloride: 106 mmol/L (ref 98–111)
GFR calc Af Amer: >60 mL/min (ref >60)
GLUCOSE: 177 mg/dL — AB (ref 70–99)
Potassium: 4.5 mmol/L (ref 3.5–5.1)
Sodium: 142 mmol/L (ref 135–145)

## 2018-08-04 MED ORDER — WHITE PETROLATUM EX OINT
TOPICAL_OINTMENT | CUTANEOUS | Status: AC
  Administered 2018-08-04: 0.2
  Filled 2018-08-04: qty 28.35

## 2018-08-04 NOTE — Progress Notes (Signed)
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 On 9/19 began to notice difficulty swallowing, not able to even swallow his own saliva.  Had been ordered Decadron but this was not given.  He was found by nursing staff unresponsive and in asystole.  He underwent 1 round of ACLS with successful return of spontaneous circulation. Tx to ICU for hypothermia protocol.  He was stabilized.  Extubated on 9/26.  Had to be reintubated on 9/29.  Extensive discussion with family.  They declined tracheostomy and PEG tube.  He was made DNR.  One-way extubation on 10/3.    Significant Hospital Events   9/16: Anterior cervical disc fusion 2/08: Asystolic arrest.  Estimated time to return of spontaneous circulation approximately 3 minutes.  Unresponsive not following commands postarrest so hypothermia protocol initiated.  Goal temperature met at: 1030 pm  9/20: Hemodynamically stable on low-dose norepinephrine 9/21: completed rewarming. Paralytic and sedation discontinued. 9/22: paralysis/sedation and vasopressor therapy discontinued yesterday. Restlessness developed last night. IV fentanyl started. Reqired iv labetalol to treat HTN. Has a h/o HTN. 9/24>> Developed fever Vanc/Zosyn started 9/25 weaned off sedation and pressors.  Weaning but copious secretions. 9/26>> Extubated to 4 L [place] 9/27>> Failed swallow, weaned to RA 9/29>> re-intubated 9/30>>>Spoke with family extensively, brother is POA, remains hypernatremic and slowly weaning.  No trach/peg.  One way extubation when optimized. 10/1>> Weaning on 12/5, transitioning off insulin gtt  08/01/2018 now off insulin drip.  Weaning well.   Awake and alert.  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 No hemorrhage, mass effect, or herniation 9/23 MRI cervical spine: No acute osseous or cord signal abnormality identified. Interval C3-C7 anterior cervical fusion and discectomy. Small prevertebral fluid collection extending into the right anterior neck compatible with postsurgical changes. No epidural collection. Mildly improved patency of the cervical spinal canal. Stable residual multilevel foraminal stenosis from uncovertebral and facet Hypertrophy. 9/23 MRI thoracic spine Mild thoracic spine spondylosis. No high-grade foraminal or canal Stenosis 9/19: CT brain noncontrasted: Negative CT cervical spine 9/19: Status post C3-C7 ACDF with expected postoperative changes.  No fracture or malalignment 2D echo 9/20 >>>Left ventricle: The cavity size was normal. There was severe concentric hypertrophy. Systolic function was vigorous. The estimated ejection fraction was in the range of 65% to 70%. Wall motion was normal; there were no regional wall motion abnormalities. Doppler parameters are consistent with abnormal left ventricular relaxation (grade 1 diastolic dysfunction). Doppler parameters are consistent with elevated mean left atrial filling pressure. - Mitral valve: Valve area by pressure half-time: 2.04 cm^2. - Left atrium: The atrium was mildly dilated. 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  Following commands but does not really communicate much.     Objective:  Vital Signs  Vitals:   08/03/18 2323 08/04/18 0347 08/04/18 0814 08/04/18 1153  BP: (!) 148/58 (!) 157/47 (!) 149/60 133/81  Pulse: 79 81 85 78  Resp: _0 Temp: 99.1 F (37.3 C) 98.5 F (36.9 C) 97.8 F (36.6 C) 98.1 F (36.7 C)  TempSrc: Oral Oral Oral Oral  SpO2: 100% 99% 100% 99%  Weight:  112.4 kg    Height:        Intake/Output Summary (Last 24 hours) at 08/04/2018 1314 Last data filed at 08/04/2018 0555 Gross per 24 hour  Intake 539 ml  Output 1100 ml  Net -561 ml   Filed Weights   08/02/18 0500 08/03/18 0500 08/04/18 0347  Weight: 112.9 kg 111.5 kg 112.4 kg    General appearance: Somnolent but easily arousable.  In no distress. Resp: Normal effort at rest.  Coarse breath sounds bilaterally.  Diminished air entry at the bases.  No wheezing rales or rhonchi. Cardio: S1-S2 is normal regular.  No S3-S4.  No rubs murmurs or bruit GI: Abdomen soft.  Nontender nondistended.  Bowel sounds are present normal.  No masses organomegaly. Extremities: Edema bilateral lower extremities Neurologic: Somnolent this morning.  Following commands.  No obvious deficits noted.    Lab Results:  Data Reviewed: I have personally reviewed following labs and imaging studies  CBC: Recent Labs  Lab 08/01/18 0353 08/01/18 0835 08/02/18 0451 08/03/18 0357 08/04/18 0431  WBC 11.3* 14.7* 12.6* 11.8* 10.6*  NEUTROABS  --   --  9.2* 9.0*  --   HGB 8.6* 11.6* 11.2* 11.8* 11.3*  HCT 28.9* 38.4* 36.4* 38.0* 36.7*  MCV 94.8 93.0 91.2 90.3 91.8  PLT 203 272 299 317 130    Basic Metabolic Panel: Recent Labs  Lab 07/29/18 0033  07/30/18 0905 07/31/18 0349 08/01/18 0353 08/02/18 0451 08/03/18 0357  08/04/18 0431  NA 150*   < > 150* 149* 144 140 143 142  K 5.1   < > 3.9 3.8 3.2* 4.1 3.8 4.5  CL 112*   < > 113* 111 119* 105 104 106  CO2 25   < > 30 33* _1 GLUCOSE 260*   < > 269* 151* 150* 215* 187* 177*  BUN 42*   < > 38* 38* 30* 35* 26* 21  CREATININE 1.48*   < > 1.21 1.16 0.90 1.00 0.94 0.88  CALCIUM 9.0   < > 7.5* 7.9* 5.8* 7.9* 8.1* 8.2*  MG 2.5*  --  2.5* 2.4  --  2.2 2.0  --   PHOS 7.0*  --   --  2.8  --  3.1 2.5  --    < > = values in this interval not displayed.    GFR: Estimated Creatinine Clearance: 86.9 mL/min (by C-G formula based on SCr of 0.88 mg/dL).  Liver Function Tests: Recent Labs  Lab 07/29/18 0330  AST 549*  ALT 528*  ALKPHOS 128*  BILITOT 0.6  PROT 5.4*  ALBUMIN 2.3*    Cardiac Enzymes: Recent Labs  Lab 07/29/18 0033 07/29/18 0330 07/29/18 1254  TROPONINI 0.07* 0.09* 0.07*    CBG: Recent Labs  Lab 08/03/18 2156 08/04/18 0028 08/04/18 0345 08/04/18 0812 08/04/18 1124  GLUCAP 171* 164* 176* 123* 135*     Recent Results (from the past 240 hour(s))  Culture, respiratory (non-expectorated)  Status: None   Collection Time: 07/29/18  1:02 AM  Result Value Ref Range Status   Specimen Description TRACHEAL ASPIRATE  Final   Special Requests NONE  Final   Gram Stain   Final    MODERATE WBC PRESENT, PREDOMINANTLY PMN FEW GRAM POSITIVE COCCI FEW GRAM POSITIVE RODS    Culture   Final    MODERATE Consistent with normal respiratory flora. Performed at [facility], [place] [address] [place], [place] [address]    Report Status 08/01/2018 FINAL  Final      Radiology Studies: No results found.   Medications:  Scheduled: . dicyclomine  10 mg Per Tube BID  . enoxaparin (LOVENOX) injection  40 mg Subcutaneous Q24H  . famotidine  20 mg Per Tube Daily  . feeding supplement (PRO-STAT SUGAR FREE 64)  30 mL Per Tube BID  . free water  300 mL Per Tube Q8H  . hydrALAZINE  25 mg Oral Q8H  . insulin aspart  0-20 Units  Subcutaneous Q4H  . insulin detemir  30 Units Subcutaneous BID  . mouth rinse  15 mL Mouth Rinse BID  . polyvinyl alcohol  1 drop Both Eyes TID  . prednisoLONE acetate  2 drop Both Eyes TID AC & HS  . primidone  50 mg Per Tube BID  . rosuvastatin  20 mg Per Tube Once per day on Tue Thu   Continuous: . feeding supplement (JEVITY 1.2 CAL) 1,000 mL (08/04/18 1117)  . lactated ringers Stopped (07/30/18 1259)  . levETIRAcetam 500 mg (08/04/18 1101)   NOM:[id number], fentaNYL (SUBLIMAZE) injection, guaiFENesin-dextromethorphan    Assessment/Plan:  Status post cardiac arrest Etiology unclear.  Patient was seen by cardiology.  Supportive care was recommended.  Patient was successfully resuscitated.  Was intubated in the intensive care unit.  Failed initial extubation on 9/26 and had to be intubated again on 9/29.  Discussions with family.  No tracheostomy or PEG tube.  Seems to be stable from a cardiac standpoint.  Echocardiogram done on 9/20 showed normal systolic function.  No wall motion abnormalities were noted.  History of coronary artery disease status post CABG Stable. He was on aspirin Plavix previously.  Was on propranolol as well.  His medications appear to be on hold currently.  Likely because he is n.p.o. status.  Chronic diastolic CHF Appears to be stable from a volume standpoint.  Echocardiogram was done on 9/20 and showed normal systolic function.  Patient was on diuretics prior to admission.  Acute respiratory failure with hypoxia One way extubation on 10/3.  Seems to be stable from a respiratory standpoint.  Oropharyngeal dysphagia Patient has a nasogastric feeding tube.  PCP continues to follow.  He is still considered high aspiration risk.  Palliative medicine consult is pending.  Aspiration pneumonia Appears to have completed treatment with antibiotics.  Remains at high risk for aspiration.    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  Imaging studies suggested hypoxic ischemic injury.  Noted to be distracted but no obvious focal neurological deficits noted.  Patient was seen by neurology previously.  EEG did not show any epileptiform activity.  However patient was continued on Keppra.  It was recommended to discontinue Keppra once patient is extubated.  Patient is noted to be somnolent this morning.  We will continue with Keppra for now.  Will reevaluate tomorrow and if he is back to baseline we will consider  discontinuing it.  Chronic kidney disease stage III Stable.  Continue to monitor urine output.  Diabetes mellitus type II Patient noted to be on long-acting insulin which is being continued.  CBGs are reasonably well controlled.  Continue SSI.  Status post anterior cervical disc fusion on 9/16 Neurosurgery has been following.  He is off of steroids.  Seems to be stable.  Normocytic anemia No evidence of overt bleeding.  Continue to monitor closely.  Diarrhea Abdomen is benign. Reason for loose stools not entirely clear.  Imodium as needed.  Abnormal LFTs Remarkably elevated AST ALT on 9/29.  Possibly due to cardiac arrest.  We will recheck the levels tomorrow.   DVT Prophylaxis: Lovenox    Code Status: DNR Family Communication: No family at bedside Disposition Plan: Inpatient rehab recommended by physical therapy.  Palliative medicine was consulted for goals of care.  Otherwise management as outlined above.    LOS: 22 days   [facility] Pager [phone] 08/04/2018, 1:14 PM  If 7PM-7AM, please contact night-coverage at [email or web address], password [facility]

## 2018-08-04 NOTE — Consult Note (Addendum)
Consultation Note Date: 08/04/2018   Patient Name: Edward [name]  DOB: 1941-07-27  MRN: [id number]  Age / Sex: 77 y.o., male  PCP: Ann Held, DO Referring Physician: Bonnielee Haff, MD  Reason for Consultation: Establishing goals of care  HPI/Patient Profile: 77 y.o. male  admitted on 07/13/2018  Clinical Assessment and Goals of Care:  77 yo gentleman with a history of diabetes cervical stenosis cord compression underwent surgery disc fusion of cervical spine. Patient underwent a PEA arrest afterwards and had a prolonged hospitalization course since then. Patient was on a ventilator, extubated on 9-26, re intubated on 9-29, one-way extubation performed on 08-02-18. It is reported on earlier goals of care discussions that the patient and family declined a tracheostomy and PEG tube and CODE STATUS was changed to DO NOT RESUSCITATE.  Patient has since been transferred to hospital medicine service. Patient has since been seen by physical medicine. He is deemed an appropriate candidate to try for inpatient rehabilitation treatment. Palliative consult for goals of care.  Chart reviewed extensively. Patient seen. Patient did not engage or participate much. He did not appear as if the patient would be able to provide his own history of participate in goals of care discussions.   Please note additional information the summary of recommendations  thank you for the consult.  NEXT OF KIN  brother Ilyas Lipsitz is listed as contact. Unable to reach him at either home or cell number.  SUMMARY OF RECOMMENDATIONS    agree with DO NOT RESUSCITATE Agree with plans for the patient going to inpatient rehabilitation Tube feeds for now, chart review notes that the patient has disagreed to PEG tube placement. SLP input and recommendations noted. Patient nothing by mouth, deemed high risk for aspiration, to continue  temporary feeds. By mouth trials or modified barium swallow recommended for 08-06-18 as per speech therapy.  Palliative medicine team to follow hospital course, disease trajectory at inpatient rehabilitation and to engage further for additional goals of care discussions based on prognosis. Code Status/Advance Care Planning:  DNR    Symptom Management:    as above   Palliative Prophylaxis:   Delirium Protocol   Psycho-social/Spiritual:   Desire for further Chaplaincy support:yes  Additional Recommendations: Caregiving  Support/Resources  Prognosis:   Guarded   Discharge Planning: patient has been deemed eligible to go to CIR.       Primary Diagnoses: Present on Admission: . Essential tremor . Obstructive sleep apnea . GERD (gastroesophageal reflux disease) . Chronic kidney disease, stage III (moderate) (HCC) . Cervical radiculopathy . Coronary artery disease involving coronary bypass graft of native heart with angina pectoris ([place]) . Essential hypertension . Cardiac arrest [facility])   I have reviewed the medical record, interviewed the patient and family, and examined the patient. The following aspects are pertinent.  Past Medical History:  Diagnosis Date  . Adenomatous colon polyp 04/2011  . CAD (coronary artery disease)    a. 01/2015 DES to LAD  b. 12/2015: [place] 85% oLCx lesion--> rx therapy.  [facility]  Chronic diastolic CHF (congestive heart failure) ([place])    a. 05/2017 Echo: EF 60-65%, Gr1 DD, no rwma, mildly dil LA, nl RV fxn, mild TR.  . CKD (chronic kidney disease), stage II    a. probable CKD II-III with baseline CR 1.1-1.3.  . DM type 2 (diabetes mellitus, type 2), insulin dependent 01/29/2014   fasting cbg 50-120 with new regimen  . Dyslipidemia, goal LDL below 70 01/29/2014  . Enlarged prostate   . Essential tremor    a. on proprnolol  . GERD (gastroesophageal reflux disease)   . Hx of CABG   . Hypertension   . Internal hemorrhoid   . Sleep apnea    does not use  cpap (06/04/2015)  . TIA (transient ischemic attack) 2002   Social History   Socioeconomic History  . Marital status: Married    Spouse name: Not on file  . Number of children: 0  . Years of education: Not on file  . Highest education level: Not on file  Occupational History  . Occupation: retired  Scientific laboratory technician  . Financial resource strain: Not on file  . Food insecurity:    Worry: Not on file    Inability: Not on file  . Transportation needs:    Medical: Not on file    Non-medical: Not on file  Tobacco Use  . Smoking status: Never Smoker  . Smokeless tobacco: Never Used  Substance and Sexual Activity  . Alcohol use: No    Alcohol/week: 0.0 standard drinks  . Drug use: No  . Sexual activity: Not Currently  Lifestyle  . Physical activity:    Days per week: Not on file    Minutes per session: Not on file  . Stress: Not on file  Relationships  . Social connections:    Talks on phone: Not on file    Gets together: Not on file    Attends religious service: Not on file    Active member of club or organization: Not on file    Attends meetings of clubs or organizations: Not on file    Relationship status: Not on file  Other Topics Concern  . Not on file  Social History Narrative   He is a Geophysicist/field seismologist by trade.   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   He lives alone.  His wife is living in [place] at this time.  They do not have children.   Highest level of education:  College graduate   Family History  Problem Relation Age of Onset  . CAD Brother        2 brothers - CABG  . Alzheimer's disease Sister   . CAD Sister   . Prostate cancer Brother   . Asthma Brother        2 brothers   . Diabetes Other        entire family  . Colon cancer Neg Hx    Scheduled Meds: . dicyclomine  10 mg Per Tube BID  . enoxaparin (LOVENOX) injection  40 mg Subcutaneous Q24H  . famotidine  20 mg Per Tube Daily  . feeding supplement (PRO-STAT SUGAR FREE 64)   30 mL Per Tube BID  . free water  300 mL Per Tube Q8H  . hydrALAZINE  25 mg Oral Q8H  . insulin aspart  0-20 Units Subcutaneous Q4H  . insulin detemir  30 Units Subcutaneous BID  . mouth rinse  15 mL Mouth Rinse BID  . polyvinyl  alcohol  1 drop Both Eyes TID  . prednisoLONE acetate  2 drop Both Eyes TID AC & HS  . primidone  50 mg Per Tube BID  . rosuvastatin  20 mg Per Tube Once per day on Tue Thu   Continuous Infusions: . feeding supplement (JEVITY 1.2 CAL) 1,000 mL (08/04/18 1117)  . lactated ringers Stopped (07/30/18 1259)  . levETIRAcetam 500 mg (08/04/18 1101)   PRN Meds:.erythromycin, fentaNYL (SUBLIMAZE) injection, guaiFENesin-dextromethorphan Medications Prior to Admission:  Prior to Admission medications   Medication Sig Start Date End Date Taking? Authorizing Provider  amLODipine (NORVASC) 10 MG tablet Take 1 tablet (10 mg total) by mouth daily. 03/13/18  Yes Roma Schanz R, DO  bismuth subsalicylate (PEPTO BISMOL) 262 MG/15ML suspension Take 30 mLs by mouth as needed for indigestion.   Yes [provider]  clonazePAM (KLONOPIN) 1 MG tablet Take 1 tablet (1 mg total) by mouth at bedtime. Patient taking differently: Take 1 mg by mouth at bedtime as needed (for sleep or anxiety).  03/13/18  Yes Ann Held, DO  cyclobenzaprine (FLEXERIL) 5 MG tablet Take 1 tablet (5 mg total) by mouth at bedtime as needed for muscle spasms (headache). Patient taking differently: Take 5 mg by mouth at bedtime as needed (for muscle spasms, leg cramps, or headaches).  04/23/18  Yes Patel, Donika K, DO  diclofenac sodium (VOLTAREN) 1 % GEL Apply 2 g topically 4 (four) times daily as needed (for pain).    Yes [provider]  dicyclomine (BENTYL) 10 MG capsule Take 10 mg by mouth 2 (two) times daily.  01/01/16  Yes [provider]  erythromycin ophthalmic ointment Place 1 application into both eyes See admin instructions. Apply to both eyelids once a day as  needed for itching   Yes [provider]  esomeprazole (NEXIUM) 40 MG capsule Take 40 mg by mouth daily as needed (for reflux).  01/01/16  Yes [provider]  furosemide (LASIX) 40 MG tablet Take 1.5 tablets (60 mg total) by mouth daily. 03/29/18 07/13/18 Yes Duke, Tami Lin, PA  gabapentin (NEURONTIN) 300 MG capsule take 1 capsule by mouth at bedtime for 1 week then INCREASE TO 1 CAP TWICE DAILY Patient taking differently: Take 300 mg by mouth 2 (two) times daily.  03/13/18  Yes Roma Schanz R, DO  hydrALAZINE (APRESOLINE) 25 MG tablet Take 1 tablet (25 mg total) by mouth 3 (three) times daily. 03/28/18 07/13/18 Yes Duke, Tami Lin, PA  insulin aspart (NOVOLOG) 100 UNIT/ML injection Inject 40 Units into the skin 3 (three) times daily before meals. 06/25/18  Yes Philemon Kingdom, MD  insulin NPH Human (NOVOLIN N RELION) 100 UNIT/ML injection Inject 30 Units into the skin 2 (two) times daily before a meal.    Yes [provider]  meclizine (ANTIVERT) 25 MG tablet Take 1 tablet (25 mg total) by mouth 2 (two) times daily as needed for dizziness. 06/10/17  Yes Theora Gianotti, NP  nitroGLYCERIN (NITROSTAT) 0.4 MG SL tablet Place 1 tablet (0.4 mg total) under the tongue every 5 (five) minutes as needed for chest pain. Max: 3 doses per episode. Patient taking differently: Place 0.4-0.8 mg under the tongue every 5 (five) minutes x 3 doses as needed for chest pain.  11/07/17  Yes Burtis Junes, NP  oxyCODONE-acetaminophen (PERCOCET/ROXICET) 5-325 MG tablet Take 1-2 tablets by mouth every 4 (four) hours as needed for moderate pain or severe pain. 07/11/18  Yes Hosie Poisson, MD  polyethylene glycol (MIRALAX / GLYCOLAX) packet Take 17 g by mouth daily. Patient taking differently: Take 17 g by mouth daily as needed for mild constipation.  07/12/18  Yes Hosie Poisson, MD  Polyvinyl Alcohol-Povidone (REFRESH OP) Place 1 drop into both eyes 3 (three) times daily as needed  (for dry eyes). Reported on 12/25/2015   Yes [provider]  potassium chloride SA (K-DUR,KLOR-CON) 20 MEQ tablet TAKE 1 & 1/2 (ONE & ONE-HALF) TABLETS BY MOUTH ONCE DAILY Patient taking differently: Take 30 mEq by mouth daily.  04/27/18  Yes Duke, Tami Lin, PA  prednisoLONE acetate (PRED FORTE) 1 % ophthalmic suspension Place 2 drops into both eyes 2 (two) times daily as needed for itching.   Yes [provider]  primidone (MYSOLINE) 50 MG tablet Take 1 tablet (50 mg total) by mouth 2 (two) times daily. 04/23/18  Yes Patel, Donika K, DO  propranolol (INDERAL) 40 MG tablet Take 1 tablet (40 mg total) by mouth 3 (three) times daily. 03/13/18  Yes Roma Schanz R, DO  rosuvastatin (CRESTOR) 20 MG tablet Take 1 tablet (20 mg total) by mouth daily. Patient taking differently: Take 20 mg by mouth 2 (two) times a week.  03/13/18 03/08/19 Yes Ann Held, DO  albuterol (VENTOLIN HFA) 108 (90 Base) MCG/ACT inhaler inhale 2 puffs by mouth every 6 hours if needed for wheezing or shortness of breath Patient not taking: Reported on 07/13/2018 07/24/17   Ann Held, DO  aspirin EC 81 MG tablet Take 1 tablet (81 mg total) by mouth daily. Hold aspirin for 5 days for the surgery, restart it as per neuro surgery. 07/11/18   Hosie Poisson, MD  chlorpheniramine-HYDROcodone (TUSSIONEX) 10-8 MG/5ML SUER Take 5 mLs by mouth every 6 (six) hours. 07/19/18   Shahmehdi, Valeria Batman, MD  clopidogrel (PLAVIX) 75 MG tablet Take 1 tablet (75 mg total) by mouth daily. Hold plavix for the surgery, restart it as per neuro surgery. 07/11/18   Hosie Poisson, MD  insulin aspart (NOVOLOG) 100 UNIT/ML injection Inject 10 Units into the skin 3 (three) times daily before meals. 07/19/18   Shahmehdi, Valeria Batman, MD  insulin aspart (NOVOLOG) 100 UNIT/ML injection Inject 0-5 Units into the skin at bedtime. 07/19/18   Shahmehdi, Valeria Batman, MD  insulin aspart (NOVOLOG) 100 UNIT/ML injection Inject 0-20 Units into the  skin 3 (three) times daily with meals. 07/19/18   Shahmehdi, Valeria Batman, MD  insulin detemir (LEVEMIR) 100 UNIT/ML injection Inject 0.2 mLs (20 Units total) into the skin 2 (two) times daily. 07/19/18   Shahmehdi, Valeria Batman, MD  Insulin Syringe-Needle U-100 (BD INSULIN SYRINGE U/F) 31G X 5/16" 1 ML MISC Use 4 times per day to inject insulin. 01/16/18   Philemon Kingdom, MD  senna-docusate (SENOKOT-S) 8.6-50 MG tablet Take 2 tablets by mouth 2 (two) times daily. 07/12/18   Hosie Poisson, MD   Allergies  Allergen Reactions  . Pneumococcal Vaccines Anaphylaxis   Review of Systems Doesn't appear to be in pain.   Physical Exam Somnolent appearing gentleman, in no distress Patient is easily arousable but does not communicate much Coarse breath sounds S1-S2 Has edema both lower extremities Does not follow commands Abdomen is not distended  Vital Signs: BP 133/81 (BP Location: Right Arm)   Pulse 78   Temp 98.1 F (36.7 C) (Oral)   Resp 20   Ht 5\' 8"  (1.727 m)   Wt 112.4 kg   SpO2 99%   BMI 37.68 kg/m  Pain Scale: 0-10 POSS *See Group Information*: S-Acceptable,Sleep, easy to arouse Pain Score: 0-No pain   SpO2: SpO2: 99 % O2 Device:SpO2: 99 % O2 Flow Rate: .O2 Flow Rate (L/min): 2 L/min  IO: Intake/output summary:   Intake/Output Summary (Last 24 hours) at 08/04/2018 1416 Last data filed at 08/04/2018 1320 Gross per 24 hour  Intake 539 ml  Output 1650 ml  Net -1111 ml    LBM: Last BM Date: 08/04/18 Baseline Weight: Weight: 127 kg Most recent weight: Weight: 112.4 kg     Palliative Assessment/Data:   PPS 30%  Time In:  1300 Time Out:  1400 Time Total:  60 min  Greater than 50%  of this time was spent counseling and coordinating care related to the above assessment and plan.  Signed by: Loistine Chance, MD [phone]   Please contact Palliative Medicine Team phone at [phone] for questions and concerns.  For individual provider: See Shea Evans

## 2018-08-04 NOTE — Progress Notes (Signed)
Brief cardiology signoff note:  Last seen by cardiology on 07/30/18 (Dr. Irish Lack). Echo was unremarkable, no clear cardiac etiology.   Per Dr. Jackalyn Lombard initial consult note: "PEA arrest Etiology is not clear.  Appears to have begun as an agonal event.  Arrhythmias appear secondary after epi administration.  I do not think that this is a primary arrhythmia.  I would not advise antiarrhythmic medicines or ICD at this time."  We will sign off at this time, but please do not hesitate to contact us with any additional questions.  [facility] will sign off.   Medication Recommendations:  Long term, would recommend aspirin 81 mg, clopidogrel 75 mg and rosuvastatin 20 mg for CAD medical management. He was on amlodipine 10 mg, furosemide 60 mg daily, potassium 30 meQ daily, propranolol 40 mg TID. These medications can be restarted in the hospital if needed for volume and blood pressure control, but they are not crucial and can be reevaluated as an outpatient. Given his CAD, if his heart rate tolerates would add beta blocker back first. Other recommendations (labs, testing, etc):  none Follow up as an outpatient:  Dr. Daneen Schick  Buford Dresser, MD, PhD [facility]  [address], [place] [address] [place], [place] [address] [phone]

## 2018-08-05 ENCOUNTER — Inpatient Hospital Stay (HOSPITAL_COMMUNITY): Payer: Medicare Other

## 2018-08-05 LAB — GLUCOSE, CAPILLARY
GLUCOSE-CAPILLARY: 118 mg/dL — AB (ref 70–99)
GLUCOSE-CAPILLARY: 147 mg/dL — AB (ref 70–99)
GLUCOSE-CAPILLARY: 171 mg/dL — AB (ref 70–99)
GLUCOSE-CAPILLARY: 222 mg/dL — AB (ref 70–99)
Glucose-Capillary: 139 mg/dL — ABNORMAL HIGH (ref 70–99)
Glucose-Capillary: 147 mg/dL — ABNORMAL HIGH (ref 70–99)
Glucose-Capillary: 203 mg/dL — ABNORMAL HIGH (ref 70–99)

## 2018-08-05 LAB — COMPREHENSIVE METABOLIC PANEL
ALT: 38 U/L (ref 0–44)
AST: 22 U/L (ref 15–41)
Albumin: 1.9 g/dL — ABNORMAL LOW (ref 3.5–5.0)
Alkaline Phosphatase: 90 U/L (ref 38–126)
Anion gap: 6 (ref 5–15)
BUN: 18 mg/dL (ref 8–23)
CHLORIDE: 105 mmol/L (ref 98–111)
CO2: 29 mmol/L (ref 22–32)
CREATININE: 0.99 mg/dL (ref 0.61–1.24)
Calcium: 7.9 mg/dL — ABNORMAL LOW (ref 8.9–10.3)
Glucose, Bld: 132 mg/dL — ABNORMAL HIGH (ref 70–99)
Potassium: 3.6 mmol/L (ref 3.5–5.1)
Sodium: 140 mmol/L (ref 135–145)
Total Bilirubin: 0.6 mg/dL (ref 0.3–1.2)
Total Protein: 5.1 g/dL — ABNORMAL LOW (ref 6.5–8.1)

## 2018-08-05 MED ORDER — PROPRANOLOL HCL 20 MG/5ML PO SOLN
20.0000 mg | Freq: Three times a day (TID) | ORAL | Status: DC
  Administered 2018-08-05 – 2018-08-07 (×7): 20 mg
  Filled 2018-08-05 (×8): qty 5

## 2018-08-05 NOTE — Progress Notes (Signed)
Subjective: Patient sleeping in bed on assessment. No acute events overnight.  Objective: Vital signs in last 24 hours: Temp:  [97.7 F (36.5 C)-100.4 F (38 C)] 98.3 F (36.8 C) (10/06 0752) Pulse Rate:  [78-85] 79 (10/06 0752) Resp:  [16-22] 22 (10/06 0752) BP: (125-150)/(40-81) 125/43 (10/06 0752) SpO2:  [99 %-100 %] 99 % (10/06 0752) Weight:  [112.4 kg] 112.4 kg (10/06 0500)  Intake/Output from previous day: 10/05 0701 - 10/06 0700 In: 705 [NG/GT:605; IV Piggyback:100] Out: 750 [Urine:525; Stool:225] Intake/Output this shift: No intake/output data recorded.  Physical exam stable.  Lab Results: Recent Labs    08/03/18 0357 08/04/18 0431  WBC 11.8* 10.6*  HGB 11.8* 11.3*  HCT 38.0* 36.7*  PLT 317 254   BMET Recent Labs    08/03/18 0357 08/04/18 0431  NA 143 142  K 3.8 4.5  CL 104 106  CO2 31 27  GLUCOSE 187* 177*  BUN 26* 21  CREATININE 0.94 0.88  CALCIUM 8.1* 8.2*     Assessment/Plan: Edward [name] is s/p ACDF on 9/16. Initially doing well following surgery. On 9/19 he experienced cardiac arrest and went through one round of ACLS with successful ROSC.[facility] CIR has been recommended and palliative care has been consulted for goals of care. No change with neurosurgical plan of care.   LOS: 23 days    Edward [name] 08/05/2018, 9:37 AM

## 2018-08-05 NOTE — Progress Notes (Signed)
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 On 9/19 began to notice difficulty swallowing, not able to even swallow his own saliva.  Had been ordered Decadron but this was not given.  He was found by nursing staff unresponsive and in asystole.  He underwent 1 round of ACLS with successful return of spontaneous circulation. Tx to ICU for hypothermia protocol.  He was stabilized.  Extubated on 9/26.  Had to be reintubated on 9/29.  Extensive discussion with family.  They declined tracheostomy and PEG tube.  He was made DNR.  One-way extubation on 10/3.    Significant Hospital Events   9/16: Anterior cervical disc fusion 2/08: Asystolic arrest.  Estimated time to return of spontaneous circulation approximately 3 minutes.  Unresponsive not following commands postarrest so hypothermia protocol initiated.  Goal temperature met at: 1030 pm  9/20: Hemodynamically stable on low-dose norepinephrine 9/21: completed rewarming. Paralytic and sedation discontinued. 9/22: paralysis/sedation and vasopressor therapy discontinued yesterday. Restlessness developed last night. IV fentanyl started. Reqired iv labetalol to treat HTN. Has a h/o HTN. 9/24>> Developed fever Vanc/Zosyn started 9/25 weaned off sedation and pressors.  Weaning but copious secretions. 9/26>> Extubated to 4 L [place] 9/27>> Failed swallow, weaned to RA 9/29>> re-intubated 9/30>>>Spoke with family extensively, brother is POA, remains hypernatremic and slowly weaning.  No trach/peg.  One way extubation when optimized. 10/1>> Weaning on 12/5, transitioning off insulin gtt  08/01/2018 now off insulin drip.  Weaning well.   Awake and alert.  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 No hemorrhage, mass effect, or herniation 9/23 MRI cervical spine: No acute osseous or cord signal abnormality identified. Interval C3-C7 anterior cervical fusion and discectomy. Small prevertebral fluid collection extending into the right anterior neck compatible with postsurgical changes. No epidural collection. Mildly improved patency of the cervical spinal canal. Stable residual multilevel foraminal stenosis from uncovertebral and facet Hypertrophy. 9/23 MRI thoracic spine Mild thoracic spine spondylosis. No high-grade foraminal or canal Stenosis 9/19: CT brain noncontrasted: Negative CT cervical spine 9/19: Status post C3-C7 ACDF with expected postoperative changes.  No fracture or malalignment 2D echo 9/20 >>>Left ventricle: The cavity size was normal. There was severe concentric hypertrophy. Systolic function was vigorous. The estimated ejection fraction was in the range of 65% to 70%. Wall motion was normal; there were no regional wall motion abnormalities. Doppler parameters are consistent with abnormal left ventricular relaxation (grade 1 diastolic dysfunction). Doppler parameters are consistent with elevated mean left atrial filling pressure. - Mitral valve: Valve area by pressure half-time: 2.04 cm^2. - Left atrium: The atrium was mildly dilated. 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  Discussed with nursing staff.  Apparently he was somnolent yesterday morning as well but then woke up later in the day.   Objective:  Vital Signs  Vitals:   08/05/18 0422 08/05/18 0500 08/05/18 0752 08/05/18 1155  BP: (!) 150/60  (!) 125/43 (!) 130/46  Pulse: 81  79 79  Resp: 16  (!) 22   Temp: 98.2 F (36.8 C)  98.3 F (36.8 C) 99.9 F (37.7 C)  TempSrc: Oral  Oral Oral  SpO2: 100%  99% 100%  Weight:  112.4 kg    Height:        Intake/Output Summary (Last 24 hours) at 08/05/2018 1159 Last data filed at 08/04/2018 1831 Gross per 24 hour  Intake 705 ml  Output 750 ml  Net -45 ml   Filed Weights   08/03/18 0500 08/04/18 0347 08/05/18 0500  Weight: 111.5 kg 112.4 kg 112.4 kg    General appearance: Somnolent but arousable.  In no distress.  Feeding tube noted. Resp: Normal effort at rest.  Coarse breath sounds bilaterally.  No wheezing rales or rhonchi. Cardio: S1-S2 is normal regular.  No S3-S4.  No rubs murmurs or bruit GI: Abdomen soft.  Nontender nondistended.  No masses organomegaly. Extremities: Mild edema bilateral lower extremities Neurologic: Somnolent but arousable.  Lab Results:  Data Reviewed: I have personally reviewed following labs and imaging studies  CBC: Recent Labs  Lab 08/01/18 0353 08/01/18 0835 08/02/18 0451 08/03/18 0357 08/04/18 0431  WBC 11.3* 14.7* 12.6* 11.8* 10.6*  NEUTROABS  --   --  9.2* 9.0*  --   HGB 8.6* 11.6* 11.2* 11.8* 11.3*  HCT 28.9* 38.4* 36.4* 38.0* 36.7*  MCV 94.8 93.0 91.2 90.3 91.8  PLT 203 272 299 317 741    Basic Metabolic Panel: Recent Labs  Lab 07/30/18 0905 07/31/18 0349 08/01/18 0353 08/02/18 0451 08/03/18 0357 08/04/18 0431 08/05/18 0622  NA 150* 149* 144 140 143 142 140  K 3.9 3.8 3.2* 4.1 3.8  4.5 3.6  CL 113* 111 119* 105 104 106 105  CO2 30 33* '25 31 31 27 29  '$ GLUCOSE 269* 151* 150* 215* 187* 177* 132*  BUN 38* 38* 30* 35* 26* 21 18  CREATININE 1.21 1.16 0.90 1.00 0.94 0.88 0.99  CALCIUM 7.5* 7.9* 5.8* 7.9* 8.1* 8.2* 7.9*  MG 2.5* 2.4  --  2.2 2.0  --   --   PHOS  --  2.8  --  3.1 2.5  --   --     GFR: Estimated Creatinine Clearance: 77.2 mL/min (by C-G formula based on SCr of 0.99 mg/dL).  Liver Function Tests: Recent Labs  Lab 08/05/18 0622  AST 22  ALT 38  ALKPHOS 90  BILITOT 0.6  PROT 5.1*  ALBUMIN 1.9*    Cardiac Enzymes: Recent Labs  Lab 07/29/18 1254  TROPONINI 0.07*    CBG: Recent Labs  Lab 08/05/18 0008 08/05/18 0036 08/05/18 0417 08/05/18 0748 08/05/18 1148  GLUCAP 222* 203* 147* 118* 171*     Recent Results (from the past 240 hour(s))  Culture, respiratory (non-expectorated)     Status: None   Collection Time: 07/29/18  1:02 AM  Result Value Ref Range Status   Specimen Description TRACHEAL ASPIRATE  Final   Special Requests NONE  Final   Gram Stain   Final    MODERATE WBC PRESENT, PREDOMINANTLY PMN FEW GRAM POSITIVE COCCI FEW GRAM POSITIVE RODS    Culture   Final  MODERATE Consistent with normal respiratory flora. Performed at [facility], [place] [address], [place], [place] [address]    Report Status 08/01/2018 FINAL  Final      Radiology Studies: No results found.   Medications:  Scheduled: . dicyclomine  10 mg Per Tube BID  . enoxaparin (LOVENOX) injection  40 mg Subcutaneous Q24H  . famotidine  20 mg Per Tube Daily  . feeding supplement (PRO-STAT SUGAR FREE 64)  30 mL Per Tube BID  . free water  300 mL Per Tube Q8H  . hydrALAZINE  25 mg Oral Q8H  . insulin aspart  0-20 Units Subcutaneous Q4H  . insulin detemir  30 Units Subcutaneous BID  . mouth rinse  15 mL Mouth Rinse BID  . polyvinyl alcohol  1 drop Both Eyes TID  . prednisoLONE acetate  2 drop Both Eyes TID AC & HS  . primidone  50 mg Per Tube BID    . rosuvastatin  20 mg Per Tube Once per day on Tue Thu   Continuous: . feeding supplement (JEVITY 1.2 CAL) 1,000 mL (08/05/18 0749)  . lactated ringers Stopped (07/30/18 1259)   UDJ:[id number], fentaNYL (SUBLIMAZE) injection, guaiFENesin-dextromethorphan    Assessment/Plan:  Status post cardiac arrest Etiology unclear.  Patient was seen by cardiology.  Supportive care was recommended.  Patient was successfully resuscitated.  Was intubated in the intensive care unit.  Failed initial extubation on 9/26 and had to be intubated again on 9/29.  Discussions with family.  No tracheostomy or PEG tube.  Seems to be stable from a cardiac standpoint.  Echocardiogram done on 9/20 showed normal systolic function.  No wall motion abnormalities were noted.  Cardiology recommends reinitiating his medications including aspirin Plavix statin when he is able to.  Patient was also on amlodipine furosemide potassium and propranolol.  History of coronary artery disease status post CABG Patient remains stable.  Patient as discussed above was on aspirin Plavix statin amlodipine furosemide and propranolol.  These medications are on hold due to n.p.o. status.  Will put him back on his propranolol via feeding tube.  We will start at a lower dose.  Chronic diastolic CHF Appears to be stable from a volume standpoint.  Echocardiogram was done on 9/20 and showed normal systolic function.  Patient was on diuretics prior to admission.  Acute respiratory failure with hypoxia One way extubation on 10/3.  Seems to be stable from a respiratory standpoint.  Oropharyngeal dysphagia Patient has a nasogastric feeding tube.  PCP continues to follow.  He is still considered high aspiration risk.  Palliative medicine consult is pending.  Aspiration pneumonia Appears to have completed treatment with antibiotics.  Remains at high risk for aspiration.  Noted to have low-grade fevers over the last 24 hours.  We will continue to  monitor this.  Leave off of antibiotics for now.  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  Imaging studies suggested hypoxic ischemic injury.  Noted to be distracted but no obvious focal neurological deficits noted.  Patient was seen by neurology previously.  EEG did not show any epileptiform activity.  However patient was continued on Keppra.  It was recommended to discontinue Keppra once patient is extubated.  Will discontinue Keppra today.    Chronic kidney disease stage III Renal function is stable.  Continue to monitor.  Diabetes mellitus type II Patient noted to be on long-acting insulin which is being  continued.  CBGs are reasonably well controlled.  Continue SSI.  Status post anterior cervical disc fusion on 9/16 Neurosurgery has been following.  He is off of steroids.  Seems to be stable.  Normocytic anemia No evidence of overt bleeding.  Continue to monitor closely.  Diarrhea Abdomen is benign. Reason for loose stools not entirely clear.  Imodium as needed.  Abnormal LFTs Remarkably elevated AST ALT on 9/29.  Possibly due to cardiac arrest.  LFTs are now normal.   DVT Prophylaxis: Lovenox    Code Status: DNR Family Communication: No family at bedside.  However discussed with patient's brother over the phone. Disposition Plan: Inpatient rehab recommended by physical therapy.  Palliative medicine was consulted for goals of care.  Management as outlined above.     LOS: 23 days   [facility] Pager [phone] 08/05/2018, 11:59 AM  If 7PM-7AM, please contact night-coverage at [email or web address], password [facility]

## 2018-08-06 DIAGNOSIS — Z7189 Other specified counseling: Secondary | ICD-10-CM

## 2018-08-06 DIAGNOSIS — Z515 Encounter for palliative care: Secondary | ICD-10-CM

## 2018-08-06 LAB — BASIC METABOLIC PANEL
ANION GAP: 6 (ref 5–15)
BUN: 17 mg/dL (ref 8–23)
CHLORIDE: 105 mmol/L (ref 98–111)
CO2: 26 mmol/L (ref 22–32)
Calcium: 7.9 mg/dL — ABNORMAL LOW (ref 8.9–10.3)
Creatinine, Ser: 0.83 mg/dL (ref 0.61–1.24)
GFR calc non Af Amer: >60 mL/min (ref >60)
GLUCOSE: 172 mg/dL — AB (ref 70–99)
Potassium: 4.3 mmol/L (ref 3.5–5.1)
Sodium: 137 mmol/L (ref 135–145)

## 2018-08-06 LAB — GLUCOSE, CAPILLARY
GLUCOSE-CAPILLARY: 151 mg/dL — AB (ref 70–99)
GLUCOSE-CAPILLARY: 177 mg/dL — AB (ref 70–99)
GLUCOSE-CAPILLARY: 179 mg/dL — AB (ref 70–99)
Glucose-Capillary: 151 mg/dL — ABNORMAL HIGH (ref 70–99)
Glucose-Capillary: 163 mg/dL — ABNORMAL HIGH (ref 70–99)
Glucose-Capillary: 176 mg/dL — ABNORMAL HIGH (ref 70–99)
Glucose-Capillary: 176 mg/dL — ABNORMAL HIGH (ref 70–99)

## 2018-08-06 NOTE — Progress Notes (Signed)
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 On 9/19 began to notice difficulty swallowing, not able to even swallow his own saliva.  Had been ordered Decadron but this was not given.  He was found by nursing staff unresponsive and in asystole.  He underwent 1 round of ACLS with successful return of spontaneous circulation. Tx to ICU for hypothermia protocol.  He was stabilized.  Extubated on 9/26.  Had to be reintubated on 9/29.  Extensive discussion with family.  They declined tracheostomy and PEG tube.  He was made DNR.  One-way extubation on 10/3.    Significant Hospital Events   9/16: Anterior cervical disc fusion 2/08: Asystolic arrest.  Estimated time to return of spontaneous circulation approximately 3 minutes.  Unresponsive not following commands postarrest so hypothermia protocol initiated.  Goal temperature met at: 1030 pm  9/20: Hemodynamically stable on low-dose norepinephrine 9/21: completed rewarming. Paralytic and sedation discontinued. 9/22: paralysis/sedation and vasopressor therapy discontinued yesterday. Restlessness developed last night. IV fentanyl started. Reqired iv labetalol to treat HTN. Has a h/o HTN. 9/24>> Developed fever Vanc/Zosyn started 9/25 weaned off sedation and pressors.  Weaning but copious secretions. 9/26>> Extubated to 4 L [place] 9/27>> Failed swallow, weaned to RA 9/29>> re-intubated 9/30>>>Spoke with family extensively, brother is POA, remains hypernatremic and slowly weaning.  No trach/peg.  One way extubation when optimized. 10/1>> Weaning on 12/5, transitioning off insulin gtt  08/01/2018 now off insulin drip.  Weaning well.   Awake and alert.  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 No hemorrhage, mass effect, or herniation 9/23 MRI cervical spine: No acute osseous or cord signal abnormality identified. Interval C3-C7 anterior cervical fusion and discectomy. Small prevertebral fluid collection extending into the right anterior neck compatible with postsurgical changes. No epidural collection. Mildly improved patency of the cervical spinal canal. Stable residual multilevel foraminal stenosis from uncovertebral and facet Hypertrophy. 9/23 MRI thoracic spine Mild thoracic spine spondylosis. No high-grade foraminal or canal Stenosis 9/19: CT brain noncontrasted: Negative CT cervical spine 9/19: Status post C3-C7 ACDF with expected postoperative changes.  No fracture or malalignment 2D echo 9/20 >>>Left ventricle: The cavity size was normal. There was severe concentric hypertrophy. Systolic function was vigorous. The estimated ejection fraction was in the range of 65% to 70%. Wall motion was normal; there were no regional wall motion abnormalities. Doppler parameters are consistent with abnormal left ventricular relaxation (grade 1 diastolic dysfunction). Doppler parameters are consistent with elevated mean left atrial filling pressure. - Mitral valve: Valve area by pressure half-time: 2.04 cm^2. - Left atrium: The atrium was mildly dilated. 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  Following commands.  Trying to communicate.     Objective:  Vital Signs  Vitals:   08/06/18 0022 08/06/18 0452 08/06/18 0750 08/06/18 1121  BP: (!) 145/53 130/67 122/60 122/63  Pulse: 67 66 70 70  Resp: '20 18 20 16  '$ Temp: 98.1 F (36.7 C) 98.6 F (37 C) (!) 97.5 F (36.4 C) 97.7 F (36.5 C)  TempSrc: Oral Oral Oral Oral  SpO2: 98% 98% 99% 100%  Weight:      Height:       No intake or output data in the 24 hours ending 08/06/18 1138 Filed Weights   08/03/18 0500 08/04/18 0347 08/05/18 0500  Weight: 111.5 kg 112.4 kg 112.4 kg    General appearance: Awake and alert this morning.  No distress.  Nasogastric feeding tube noted. Resp: Normal effort at rest.  Coarse breath sounds bilaterally.  No wheezing rales or rhonchi. Cardio: S1-S2 is normal regular.  No S3-S4.  No rubs murmurs or bruit GI: Abdomen soft.  Nontender nondistended Extremities: Mild edema bilateral lower extremities Neurologic: Moving all his extremities.  Awake and alert  Lab Results:  Data Reviewed: I have personally reviewed following labs and imaging studies  CBC: Recent Labs  Lab 08/01/18 0353 08/01/18 0835 08/02/18 0451 08/03/18 0357 08/04/18 0431  WBC 11.3* 14.7* 12.6* 11.8* 10.6*  NEUTROABS  --   --  9.2* 9.0*  --   HGB 8.6* 11.6* 11.2* 11.8* 11.3*  HCT 28.9* 38.4* 36.4* 38.0* 36.7*  MCV 94.8 93.0 91.2 90.3 91.8  PLT 203 272 299 317 056    Basic Metabolic Panel: Recent Labs  Lab 07/31/18 0349  08/02/18 0451 08/03/18 0357 08/04/18 0431 08/05/18 0622 08/06/18 0641  NA 149*   < > 140 143 142 140 137  K 3.8   < > 4.1 3.8 4.5 3.6 4.3  CL 111   < > 105 104 106 105 105  CO2 33*   < > '31 31 27 29 26  '$ GLUCOSE 151*   < > 215* 187* 177* 132* 172*  BUN 38*   < > 35*  26* '21 18 17  '$ CREATININE 1.16   < > 1.00 0.94 0.88 0.99 0.83  CALCIUM 7.9*   < > 7.9* 8.1* 8.2* 7.9* 7.9*  MG 2.4  --  2.2 2.0  --   --   --   PHOS 2.8  --  3.1 2.5  --   --   --    < > = values in this interval not displayed.    GFR: Estimated Creatinine Clearance: 92.1 mL/min (by C-G formula based on SCr of 0.83 mg/dL).  Liver Function Tests: Recent Labs  Lab 08/05/18 0622  AST 22  ALT 38  ALKPHOS 90  BILITOT 0.6  PROT 5.1*  ALBUMIN 1.9*    CBG: Recent Labs  Lab 08/05/18 1551 08/05/18 1955 08/06/18 0020 08/06/18 0450 08/06/18 0741  GLUCAP 147* 139* 176* 151* 151*     Recent Results (from the past 240 hour(s))  Culture, respiratory (non-expectorated)     Status: None   Collection Time: 07/29/18  1:02 AM  Result Value Ref Range Status   Specimen Description TRACHEAL ASPIRATE  Final   Special Requests NONE  Final   Gram Stain   Final    MODERATE WBC PRESENT, PREDOMINANTLY PMN FEW GRAM POSITIVE COCCI FEW GRAM POSITIVE RODS    Culture   Final    MODERATE  Consistent with normal respiratory flora. Performed at [facility], [place] [address] [place], [place] [address]    Report Status 08/01/2018 FINAL  Final      Radiology Studies: Dg Chest Port 1 View  Result Date: 08/05/2018 CLINICAL DATA:  Difficulty breathing EXAM: PORTABLE CHEST 1 VIEW COMPARISON:  08/02/2018 FINDINGS: Prior CABG. Cardiomegaly. Low lung volumes with bibasilar atelectasis. No confluent opacities, effusions or edema. IMPRESSION: Low lung volumes, bibasilar atelectasis. Electronically Signed   By: Rolm Baptise M.D.   On: 08/05/2018 14:06     Medications:  Scheduled: . dicyclomine  10 mg Per Tube BID  . enoxaparin (LOVENOX) injection  40 mg Subcutaneous Q24H  . famotidine  20 mg Per Tube Daily  . feeding supplement (PRO-STAT SUGAR FREE 64)  30 mL Per Tube BID  . free water  300 mL Per Tube Q8H  . hydrALAZINE  25 mg Oral Q8H  . insulin aspart  0-20 Units Subcutaneous Q4H  . insulin  detemir  30 Units Subcutaneous BID  . mouth rinse  15 mL Mouth Rinse BID  . polyvinyl alcohol  1 drop Both Eyes TID  . prednisoLONE acetate  2 drop Both Eyes TID AC & HS  . primidone  50 mg Per Tube BID  . propranolol  20 mg Per Tube TID  . rosuvastatin  20 mg Per Tube Once per day on Tue Thu   Continuous: . feeding supplement (JEVITY 1.2 CAL) 1,000 mL (08/05/18 2045)  . lactated ringers Stopped (07/30/18 1259)   MEQ:[id number], fentaNYL (SUBLIMAZE) injection, guaiFENesin-dextromethorphan    Assessment/Plan:  Status post cardiac arrest Etiology unclear.  Patient was seen by cardiology.  Supportive care was recommended.  Patient was successfully resuscitated.  Was intubated in the intensive care unit.  Failed initial extubation on 9/26 and had to be intubated again on 9/29.  Discussions with family.  No tracheostomy or PEG tube.  Patient seems to be stable from a cardiac standpoint.   Echocardiogram done on 9/20 showed normal systolic function.  No wall motion abnormalities were noted.  Cardiology recommends reinitiating his medications including aspirin Plavix statin when he is able to.  Patient was also on amlodipine furosemide potassium and propranolol.  These can also be resumed gradually, hopefully soon.  History of coronary artery disease status post CABG Patient remains stable.  Patient as discussed above was on aspirin Plavix statin amlodipine furosemide and propranolol.  These medications are on hold due to n.p.o. status.  He was placed back on his propranolol via feeding tube.  Blood pressure is noted to be well controlled.  Hold off on resuming amlodipine.  Volume status is stable.  Hold off on resuming furosemide and potassium for now.  Antiplatelet agents could be resumed soon for swallow function improves.   Chronic diastolic CHF Appears to be stable from a volume standpoint.  Echocardiogram was done on 9/20 and showed normal systolic function.  Patient was on diuretics prior  to admission.  Chest x-ray did not show any pulmonary edema.  Acute respiratory failure with hypoxia One way extubation on 10/3.  Seems to be stable from a respiratory standpoint.  Oropharyngeal dysphagia Patient has a nasogastric feeding tube.  Speech therapy continues to follow.  He is noted to be much more awake and alert this morning.  FEES to be done today.  Palliative medicine is also following.  Aspiration pneumonia Appears to have completed treatment with antibiotics.  Remains at high risk for aspiration.  Did have low-grade  fever over the last 48 hours but appears to have improved.  Leave off of antibiotics for now.  Chest x-ray did not show any obvious infiltrate.    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  Imaging studies suggested hypoxic ischemic injury.  Noted to be distracted but no obvious focal neurological deficits noted.  Patient was seen by neurology previously.  EEG did not show any epileptiform activity.  Patient was started on Keppra initially.  However neurology recommended discontinuing it once patient was extubated.  So this was discontinued on 10/6.  Continue to monitor.    Chronic kidney disease stage III Renal function remains stable.  Continue to monitor urine output.  Diabetes mellitus type II Patient is on detemir.  CBGs are reasonably well controlled.  Continue SSI.  HbA1c was 9.5 in August.   Status post anterior cervical disc fusion on 9/16 Neurosurgery has been following.  He is off of steroids.  Seems to be stable.  Normocytic anemia No evidence of overt bleeding.  Continue to monitor closely.  Diarrhea Abdomen remains benign.  Continue Imodium as needed.  Remove Flexi-Seal as soon as possible.  Abnormal LFTs Remarkably elevated AST ALT on 9/29.  Possibly due to cardiac arrest.  LFTs were normal on 10/6.  Goals of care This was done by critical  care medicine initially and then by palliative care.  Patient's next of kin is his brother.  No reintubation.  DNR.  No feeding tubes.  No tracheostomy.  Palliative medicine continues to follow.  He has a nasogastric feeding tube which is a temporary solution.  Patient brother is aware of this.  Inpatient rehabilitation has been recommended which is reasonable.  If patient does not progress and if his dysphagia does not improve then may have to reconsider current approach.   DVT Prophylaxis: Lovenox    Code Status: DNR Family Communication: Discussed with patient's brother over the phone Disposition Plan: Inpatient rehab recommended by physical therapy.  Medically patient remains stable.    LOS: 24 days   [facility] Pager [phone] 08/06/2018, 11:38 AM  If 7PM-7AM, please contact night-coverage at [email or web address], password [facility]

## 2018-08-06 NOTE — Progress Notes (Signed)
  Speech Language Pathology Treatment: Dysphagia  Patient Details Name: Edward [name] MRN: [id number] DOB: [birth year] Today's Date: 08/06/2018 Time: 0900-0920 SLP Time Calculation (min) (ACUTE ONLY): 20 min  Assessment / Plan / Recommendation Clinical Impression  Pt demonstrates improving strength and mentation. Immediate signs of aspiration still present, but pt able to iniaite several potential compensatory strategies that may assist with airway protection in FEES. Will proceed with objective test today to determine potential for PO initiation.   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  On 9/19 began to notice difficulty swallowing and dysphonia, not able to even swallow his own saliva.  Had been ordered Decadron but this was not given.  He was found by nursing staff unresponsive and in asystole.  He underwent 1 round of ACLS with successful return of spontaneous circulation after 3 minutes. Possible anoxic injury. Iintubated from 9/19 to 9/26. CT shows no hematoma, but does show soft tissue swelling in pharyngeal wall. SLP evalauted pt on 9/27, pt not ready for POs, remained NPO and Cortrak placed. Pt reintubated on 9/30 and extubated on 10/3 with new DNR status.       SLP Plan  Other (Comment)(FEES)       Recommendations  Diet recommendations: NPO                Oral Care Recommendations: Oral care QID Plan: Other (Comment)(FEES)       GO               Edward Baltimore, MA [place]  Acute Rehabilitation Services Pager [phone] Office [phone]  Edward [name] 08/06/2018, 9:38 AM

## 2018-08-06 NOTE — Progress Notes (Signed)
Inpatient Rehabilitation Admissions Coordinator  I assessed pt at bedside and then contacted his brother, Lissa Merlin, by phone. Pt moved from [place] in 2008 and has lived alone. He is married, but his wife did not relocate here with him. Brother states he is POA. Earlier in this hospitalization pt had asked to receive SNF at [facility] for he was there previously. Brother states he is unable to provide the care needed physically for [place]. He is requesting SNF rehab at this time. I have alerted SW, Kathlee Nations. Pt currently not at a level to participate in the intensity of rehab nor does he have the caregiver support that he will likely need. I will follow his progress to assist if his toelrance increases.  Danne Baxter, RN, MSN Rehab Admissions Coordinator [phone] 08/06/2018 3:47 PM

## 2018-08-06 NOTE — Progress Notes (Signed)
Daily Progress Note   Patient Name: Edward [name]       Date: 08/06/2018 DOB: Aug 17, 1941  Age: 77 y.o. MRN#: [id number] Attending Physician: Bonnielee Haff, MD Primary Care Physician: Carollee Herter, Alferd Apa, DO Admit Date: 07/13/2018  Reason for Consultation/Follow-up: Establishing goals of care  Subjective:  Patient is resting in bed, he appears weak, chronically ill.   He is in no distress, how ever, appears with generalized weakness.   He has an NGT with tube feeds ongoing   No family at bedside, I have called and discussed with his brother in detail on 08-05-18, discussions noted below.    Length of Stay: 24  Current Medications: Scheduled Meds:  . dicyclomine  10 mg Per Tube BID  . enoxaparin (LOVENOX) injection  40 mg Subcutaneous Q24H  . famotidine  20 mg Per Tube Daily  . feeding supplement (PRO-STAT SUGAR FREE 64)  30 mL Per Tube BID  . free water  300 mL Per Tube Q8H  . hydrALAZINE  25 mg Oral Q8H  . insulin aspart  0-20 Units Subcutaneous Q4H  . insulin detemir  30 Units Subcutaneous BID  . mouth rinse  15 mL Mouth Rinse BID  . polyvinyl alcohol  1 drop Both Eyes TID  . prednisoLONE acetate  2 drop Both Eyes TID AC & HS  . primidone  50 mg Per Tube BID  . propranolol  20 mg Per Tube TID  . rosuvastatin  20 mg Per Tube Once per day on Tue Thu    Continuous Infusions: . feeding supplement (JEVITY 1.2 CAL) 1,000 mL (08/05/18 2045)  . lactated ringers Stopped (07/30/18 1259)    PRN Meds: erythromycin, fentaNYL (SUBLIMAZE) injection, guaiFENesin-dextromethorphan  Physical Exam          Somnolent but awakens when his name is called. Nods yes/no appropriately to questions asked He has a NGT    Coarse breath sounds bilaterally.  No wheezing rales or  rhonchi. Cardio: S1-S2 is normal regular.    GI: Abdomen soft.  Nontender non distended  Extremities: Mild edema bilateral lower extremities Neurologic: Somnolent but arousable.  Vital Signs: BP 122/60 (BP Location: Right Arm)   Pulse 70   Temp (!) 97.5 F (36.4 C) (Oral)   Resp 20   Ht 5\' 8"  (1.727 m)  Wt 112.4 kg   SpO2 99%   BMI 37.68 kg/m  SpO2: SpO2: 99 % O2 Device: O2 Device: Nasal Cannula O2 Flow Rate: O2 Flow Rate (L/min): 2 L/min  Intake/output summary: No intake or output data in the 24 hours ending 08/06/18 0852 LBM: Last BM Date: 08/05/18 Baseline Weight: Weight: 127 kg Most recent weight: Weight: 112.4 kg       Palliative Assessment/Data:      Patient Active Problem List   Diagnosis Date Noted  . 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 Endotracheal tube present   . Pressure injury of skin 07/28/2018  . Cardiac arrest ([place]) 07/19/2018  . Acute respiratory failure ([place])   . Acute metabolic encephalopathy   . Radiculopathy 07/13/2018  . Cervical spinal stenosis 07/10/2018  . Cervical radiculopathy 07/10/2018  . Cellulitis of left anterior lower leg 06/19/2018  . Numbness and tingling in both hands 06/19/2018  . Chronic daily headache 04/23/2018  . Palpitations 03/28/2018  . Anxiety 07/24/2017  . Atelectasis   . Coronary artery disease involving coronary bypass graft of native heart with angina pectoris ([place])   . Hypertensive heart disease without heart failure   . Chronic kidney disease, stage III (moderate) ([place]) 11/03/2016  . Chronic diastolic heart failure ([place])   . Near syncope 04/19/2016  . Diabetes mellitus ([place]) 03/11/2016  . GERD (gastroesophageal reflux disease) 08/27/2015  . Acute bronchitis 08/27/2015  . SOB (shortness of breath) 07/08/2015  . Obstructive sleep apnea 06/22/2015  . Essential tremor 04/23/2015  . Impacted cerumen of right ear 11/20/2014  . Hyperlipidemia LDL goal <70 01/29/2014  . Essential hypertension 01/29/2014     Palliative Care Assessment & Plan   Patient Profile:  77 yo gentleman with a history of diabetes cervical stenosis cord compression underwent surgery disc fusion of cervical spine. Patient underwent a PEA arrest afterwards and had a prolonged hospitalization course since then. Patient was on a ventilator, extubated on 9-26, re intubated on 9-29, one-way extubation performed on 08-02-18. It is reported on earlier goals of care discussions that the patient and family declined a tracheostomy and PEG tube and CODE STATUS was changed to DO NOT RESUSCITATE.  Patient has since been transferred to hospital medicine service. Patient has since been seen by physical medicine. He is deemed an appropriate candidate to try for inpatient rehabilitation treatment. Palliative consult for goals of care.  Assessment:  status postcardiac arrest History of coronary artery disease status post CABG Acute respiratory failure with hypoxia status post one-way extubation on 08-02-18 Ongoing oropharyngeal dysfunction, patient continues on nasogastric tube feedings, speech pathology following, high aspiration risk, likely to undergo modified barium swallow soon Aspiration pneumonia Multifactorial encephalopathy probably secondary to hypoxic ischemic injury, acute metabolic encephalopathy. Questionable seizures. History of anterior cervical disc fusion on 07-16-18 Stage III chronic kidney disease    Recommendations/Plan:   palliative medicine has been consultative for goals of care discussions. [facility] inpatient rehabilitation has also been following.  I discussed with the patient's brother, primary decision maker on 08-05-18. We discussed frankly that the patient has had an extensive prolonged hospital course, but continues to have generalized weakness, continues to have serious ongoing illnesses, particularly his dysphagia was discussed in great detail. Goals wishes and values attempted to be explored. I discussed with  the patient's brother that the tube feedings are temporary via the nasogastric tube. Overall, family's goals are not to pursue PEG tube placement. Hence, briefly discussed about comfort feeds/comfort measures in the near future should the patient  have ongoing dysphasia. Patient's brother states that he would like to discuss that at another time. For now, continue tube feeds and continue rehabilitation efforts. Brother stated that the patient did have a wife but that she was with a host of medical conditions herself and requires 24 7 caregivers and is not able to make decisions on the patient's behalf.   Code Status:    Code Status Orders  (From admission, onward)         Start     Ordered   07/29/18 1424  Do not attempt resuscitation (DNR)  Continuous    Question Answer Comment  In the event of cardiac or respiratory ARREST Do not call a "code blue"   In the event of cardiac or respiratory ARREST Do not perform Intubation, CPR, defibrillation or ACLS   In the event of cardiac or respiratory ARREST Use medication by any route, position, wound care, and other measures to relive pain and suffering. May use oxygen, suction and manual treatment of airway obstruction as needed for comfort.   Comments No CPR, No medications      07/29/18 1423        Code Status History    Date Active Date Inactive Code Status Order ID Comments User Context   07/19/2018 1731 07/29/2018 1423 Full Code [id number]  Aldean Jewett, MD Inpatient   07/10/2018 0238 07/12/2018 1751 Full Code [id number]  Vianne Bulls, MD ED   03/08/2018 1745 03/11/2018 1907 Full Code [id number]  Abigail Butts., PA-C ED   07/15/2017 1705 07/17/2017 1857 Full Code [id number]  Georgette Shell, MD ED   06/06/2017 1832 06/10/2017 1740 Full Code [id number]  Belva Crome, MD Inpatient   02/05/2017 2030 02/06/2017 1825 Full Code [id number]  Sid Falcon, MD Inpatient   10/29/2016 1741 11/15/2016 1619 Full Code [id number]  Charlie Pitter, PA-C  Inpatient   04/19/2016 2213 04/22/2016 2149 Full Code [id number]  Vianne Bulls, MD ED   12/18/2015 1756 12/24/2015 1750 Full Code [id number]  Lonn Georgia, PA-C Inpatient   10/31/2015 0609 11/01/2015 1721 Full Code [id number]  Etta Quill, DO ED   06/04/2015 1912 06/05/2015 2336 Full Code [id number]  Almyra Deforest, [place] Inpatient   01/30/2015 1259 01/31/2015 1413 Full Code [id number]  Belva Crome, MD Inpatient   07/30/2014 1549 07/31/2014 1608 Full Code [id number]  Newt Minion, MD Inpatient   04/16/2014 1646 04/17/2014 1514 Full Code [id number]  Geradine Girt, DO Inpatient   01/28/2014 1613 01/29/2014 1541 Full Code [id number]  Belva Crome, MD Inpatient   01/27/2014 1700 01/28/2014 1613 Full Code [id number]  Charlie Pitter, PA-C ED    Advance Directive Documentation     Most Recent Value  Type of Advance Directive  Living will  Pre-existing out of facility DNR order (yellow form or pink MOST form)  -  "MOST" Form in Place?  -       Prognosis:   guarded  Discharge Planning:  recommend PMT follow the patient at CIR, for monitoring disease trajectory and to further engage with patient's family regarding overall goals of care.   Care plan was discussed with  Patient's brother on 08-05-18.   Thank you for allowing the Palliative Medicine Team to assist in the care of this patient.   Time In: 8.20 am  Time Out: 8.55 Total Time 35 Prolonged Time Billed  no       Greater than 50%  of this time was  spent counseling and coordinating care related to the above assessment and plan.  Loistine Chance, MD [phone]  Please contact Palliative Medicine Team phone at [phone] for questions and concerns.

## 2018-08-06 NOTE — Procedures (Signed)
Objective Swallowing Evaluation: Type of Study: FEES-Fiberoptic Endoscopic Evaluation of Swallow   Patient Details  Name: Edward [name] MRN: [id number] Date of Birth: 10/28/1941  Today's Date: 08/06/2018 Time: SLP Start Time (ACUTE ONLY): 1330 -SLP Stop Time (ACUTE ONLY): 1403  SLP Time Calculation (min) (ACUTE ONLY): 33 min   Past Medical History:  Past Medical History:  Diagnosis Date  . Adenomatous colon polyp 04/2011  . CAD (coronary artery disease)    a. 01/2015 DES to LAD  b. 12/2015: [place] 85% oLCx lesion--> rx therapy.  . Chronic diastolic CHF (congestive heart failure) ([place])    a. 05/2017 Echo: EF 60-65%, Gr1 DD, no rwma, mildly dil LA, nl RV fxn, mild TR.  . CKD (chronic kidney disease), stage II    a. probable CKD II-III with baseline CR 1.1-1.3.  . DM type 2 (diabetes mellitus, type 2), insulin dependent 01/29/2014   fasting cbg 50-120 with new regimen  . Dyslipidemia, goal LDL below 70 01/29/2014  . Enlarged prostate   . Essential tremor    a. on proprnolol  . GERD (gastroesophageal reflux disease)   . Hx of CABG   . Hypertension   . Internal hemorrhoid   . Sleep apnea    does not use cpap (06/04/2015)  . TIA (transient ischemic attack) 2002   Past Surgical History:  Past Surgical History:  Procedure Laterality Date  . ANTERIOR CERVICAL DECOMPRESSION/DISCECTOMY FUSION 4 LEVELS N/A 07/16/2018   Procedure: CERVICAL THREE-FOUR CERVICAL FOUR-FIVE CERVICAL FIVE-SIX CERVICAL SIX-SEVEN ANTERIOR CERVICAL DECOMPRESSION/DISCECTOMY/FUSION;  Surgeon: Judith Part, MD;  Location: [facility];  Service: Neurosurgery;  Laterality: N/A;  CERVICAL THREE-FOUR CERVICAL FOUR-FIVE CERVICAL FIVE-SIX CERVICAL SIX-SEVEN ANTERIOR CERVICAL DECOMPRESSION/DISCECTOMY/FUSION  . BACK SURGERY    . CARDIAC CATHETERIZATION  01/28/14   + CAD treat medically  . CARDIAC CATHETERIZATION N/A 12/21/2015   Procedure: Right/Left Heart Cath and Coronary Angiography;  Surgeon: Belva Crome, MD;  Location: [facility] CV LAB;  Service: Cardiovascular;  Laterality: N/A;  . CARDIAC CATHETERIZATION N/A 11/02/2016   Procedure: Left Heart Cath and Coronary Angiography;  Surgeon: Nelva Bush, MD;  Location: [place] [facility] CV LAB;  Service: Cardiovascular;  Laterality: N/A;  . CATARACT EXTRACTION Bilateral   . CATARACT EXTRACTION, BILATERAL Bilateral   . CORONARY ANGIOPLASTY WITH STENT PLACEMENT  01/30/2015   DES [place]  [place] to LAD by Dr Tamala Julian  . CORONARY ARTERY BYPASS GRAFT N/A 11/09/2016   Procedure: CORONARY ARTERY BYPASS GRAFTING (CABG) x 2;  Surgeon: Melrose Nakayama, MD;  Location: [facility];  Service: Open Heart Surgery;  Laterality: N/A;  . ENDOVEIN HARVEST OF GREATER SAPHENOUS VEIN Left 11/09/2016   Procedure: ENDOVEIN HARVEST OF GREATER SAPHENOUS VEIN;  Surgeon: Melrose Nakayama, MD;  Location: [facility];  Service: Open Heart Surgery;  Laterality: Left;  . JOINT REPLACEMENT    . LEFT HEART CATH AND CORS/GRAFTS ANGIOGRAPHY N/A 03/09/2018   Procedure: LEFT HEART CATH AND CORS/GRAFTS ANGIOGRAPHY;  Surgeon: Belva Crome, MD;  Location: [place] CV LAB;  Service: Cardiovascular;  Laterality: N/A;  . LEFT HEART CATHETERIZATION WITH CORONARY ANGIOGRAM N/A 01/28/2014   Procedure: LEFT HEART CATHETERIZATION WITH CORONARY ANGIOGRAM;  Surgeon: Sinclair Grooms, MD;  Location[facility] CATH LAB;  Service: Cardiovascular;  Laterality: N/A;  . LEFT HEART CATHETERIZATION WITH CORONARY ANGIOGRAM N/A 01/30/2015   Procedure: LEFT HEART CATHETERIZATION WITH CORONARY ANGIOGRAM;  Surgeon: Belva Crome, MD;  Location: [facility] CATH LAB;  Service: Cardiovascular;  Laterality: N/A;  . [place]  SURGERY    . SHOULDER ARTHROSCOPY Right 07/30/2014   Procedure: Right Shoulder Arthroscopy, Debridement, Decompression, Manipulation Under Anesthesia;  Surgeon: Newt Minion, MD;  Location: [facility];  Service: Orthopedics;  Laterality: Right;  . TEE WITHOUT CARDIOVERSION N/A 11/09/2016   Procedure: TRANSESOPHAGEAL ECHOCARDIOGRAM (TEE);   Surgeon: Melrose Nakayama, MD;  Location: [place];  Service: Open Heart Surgery;  Laterality: N/A;  . TOTAL KNEE ARTHROPLASTY Bilateral    HPI: 77 year old patient with history of diabetes with history of difficult glycemic control and cervical stenosis resulting in cord compression underwent anterior cervical disc fusion of C3-7 cervical spine on September 16.  On 9/19 began to notice difficulty swallowing and dysphonia, not able to even swallow his own saliva.  Had been ordered Decadron but this was not given.  He was found by nursing staff unresponsive and in asystole.  He underwent 1 round of ACLS with successful return of spontaneous circulation after 3 minutes. Possible anoxic injury. Iintubated from 9/19 to 9/26. CT shows no hematoma, but does show soft tissue swelling in pharyngeal wall. SLP evalauted pt on 9/27, pt not ready for POs, remained NPO and Cortrak placed. Pt reintubated on 9/30 and extubated on 10/3 with new DNR status.    No data recorded   Assessment / Plan / Recommendation  CHL IP CLINICAL IMPRESSIONS 08/06/2018  Clinical Impression Pt demosntrates severe oropharyngeal dysphagia, upon direct visualization the posterior pharyngeal wall intrudes on the pharyngeal space, there are standing secretions in the lateral channels and in the vestibule. Glottis looks edematous and small, but otherwise good. Pt was repositioned prior to test, but quickly resumed a right lean with posterior head tilt. Pt is able to initiate a swallow on command, cough and eject penetrate. However, hyolaryngeal excursion and UES opening are restricted, likely by pressure of hardware/edema on the cricopharyngeus. Both thin and honey thick liquids are aspirated during and after the swallow with sensation; diffuse residue is severe. Pt cannot utilize positional strategies due to limited ROM of his neck, He can cough and swallow repeatedly to eject aspirate, but effort and discomfort to swallow just one bolus is  intolerable. Overall, pt is not only unsafe to swallow, but he is functionally not capable of doing so. Will continue to follow for trials this week and consider MBS later this week for better visualization of swallow mechanism.   SLP Visit Diagnosis Dysphagia, pharyngoesophageal phase (R13.14)  Attention and concentration deficit following --  Frontal lobe and executive function deficit following --  Impact on safety and function Severe aspiration risk      CHL IP TREATMENT RECOMMENDATION 08/06/2018  Treatment Recommendations Therapy as outlined in treatment plan below     Prognosis 08/06/2018  Prognosis for Safe Diet Advancement Guarded  Barriers to Reach Goals Severity of deficits  Barriers/Prognosis Comment --    CHL IP DIET RECOMMENDATION 08/06/2018  SLP Diet Recommendations NPO  Liquid Administration via --  Medication Administration Via alternative means  Compensations --  Postural Changes --      No flowsheet data found.    CHL IP FOLLOW UP RECOMMENDATIONS 08/06/2018  Follow up Recommendations Inpatient Rehab      CHL IP FREQUENCY AND DURATION 08/06/2018  Speech Therapy Frequency (ACUTE ONLY) min 3x week  Treatment Duration 2 weeks           CHL IP ORAL PHASE 08/06/2018  Oral Phase WFL  Oral - Pudding Teaspoon --  Oral - Pudding Cup --  Oral - Honey Teaspoon --  Oral - Honey Cup --  Oral - Nectar Teaspoon --  Oral - Nectar Cup --  Oral - Nectar Straw --  Oral - Thin Teaspoon --  Oral - Thin Cup --  Oral - Thin Straw --  Oral - Puree --  Oral - Mech Soft --  Oral - Regular --  Oral - Multi-Consistency --  Oral - Pill --  Oral Phase - Comment --    CHL IP PHARYNGEAL PHASE 08/06/2018  Pharyngeal Phase Impaired  Pharyngeal- Pudding Teaspoon --  Pharyngeal --  Pharyngeal- Pudding Cup --  Pharyngeal --  Pharyngeal- Honey Teaspoon --  Pharyngeal --  Pharyngeal- Honey Cup Reduced anterior laryngeal mobility;Significant aspiration (Amount);Pharyngeal residue -  valleculae;Pharyngeal residue - pyriform;Penetration/Aspiration during swallow;Penetration/Apiration after swallow  Pharyngeal Material enters airway, passes BELOW cords and not ejected out despite cough attempt by patient;Material enters airway, passes BELOW cords then ejected out;Material enters airway, CONTACTS cords and not ejected out  Pharyngeal- Nectar Teaspoon --  Pharyngeal --  Pharyngeal- Nectar Cup --  Pharyngeal --  Pharyngeal- Nectar Straw --  Pharyngeal --  Pharyngeal- Thin Teaspoon --  Pharyngeal --  Pharyngeal- Thin Cup --  Pharyngeal --  Pharyngeal- Thin Straw Penetration/Aspiration during swallow;Penetration/Apiration after swallow;Significant aspiration (Amount);Pharyngeal residue - valleculae;Pharyngeal residue - pyriform;Reduced airway/laryngeal closure  Pharyngeal Material enters airway, passes BELOW cords and not ejected out despite cough attempt by patient;Material enters airway, passes BELOW cords then ejected out  Pharyngeal- Puree --  Pharyngeal --  Pharyngeal- Mechanical Soft --  Pharyngeal --  Pharyngeal- Regular --  Pharyngeal --  Pharyngeal- Multi-consistency --  Pharyngeal --  Pharyngeal- Pill --  Pharyngeal --  Pharyngeal Comment --     No flowsheet data found.   Belem Hintze, Katherene Ponto 08/06/2018, 2:29 PM

## 2018-08-06 NOTE — Progress Notes (Signed)
Physical Therapy Treatment Patient Details Name: Edward [name] MRN: [id number] DOB: 12-10-1940 Today's Date: 08/06/2018    History of Present Illness pt is a 77 y/o male admitted with neck pain and arm weakness s/p C3-7 ACDF on 9/16. Pt with aystole on 9/19, VDRF 9/19-9/26. PMhx: CAD, CKD, DM2, CABG, HTN, He sustained another cardiac arrest/PEA 9/29, received epi x3 and then developed VF requiring shock, total downtime documented as 7 minutes but head CT concerning of loss of gray and white matter. Reintubated 9/29. Extubated 10/3.     PT Comments    Patient progressing well towards PT goals. Tolerated treatment in vital go bed, working on upright tolerance and WB through BLEs using tilt feature. Pt got to 50 degree tilt with 62% WB through BLEs. Able to perform exercises in partial standing. Pt asymptomatic. See below for vitals. Encouraged chair position for 45 min-1 hour to improve tolerance/upright. Pt with right bias- worked on finding midline and using UEs to get there. Continues to demonstrate some cognitive deficits but difficult to fully assess due to raspy voice- whispering at times. Anticipate pt will progress well in new bed. Appropriate for CIR. Will follow.   Follow Up Recommendations  CIR;Supervision/Assistance - 24 hour     Equipment Recommendations  None recommended by PT    Recommendations for Other Services       Precautions / Restrictions Precautions Precautions: Fall;Cervical Precaution Booklet Issued: No Precaution Comments: NG tube Restrictions Weight Bearing Restrictions: No    Mobility  Bed Mobility               General bed mobility comments: Deferred; used tilt feature on vital go bed  Transfers                 General transfer comment: Performed tilt on the vital go bed to 50 degrees placing 62lbs through BLEs, no dizziness. VSS  Ambulation/Gait                 Stairs             Wheelchair Mobility    Modified  Rankin (Stroke Patients Only)       Balance                                            Cognition Arousal/Alertness: Awake/alert Behavior During Therapy: WFL for tasks assessed/performed Overall Cognitive Status: No family/caregiver present to determine baseline cognitive functioning Area of Impairment: Attention;Following commands                   Current Attention Level: Sustained   Following Commands: Follows one step commands inconsistently;Follows one step commands with increased time     Problem Solving: Slow processing;Requires verbal cues;Decreased initiation General Comments: Requires repetition to follow commands and increased tiem.       Exercises General Exercises - Lower Extremity Long Arc Quad: Both;5 reps;Seated(chair position vital go bed) Other Exercises Other Exercises: Performed mini squats standing in tilt bed holding 5sec x10 Other Exercises: Worked on lateral trunk lean towards left using LUE to pull to midline as pt with right bias    General Comments General comments (skin integrity, edema, etc.): BP prior to tilting bed 128/62; @41  degree tilt, BP 119/62, @50  degree tilt, 112/61, no dizziness throughout session.       Pertinent Vitals/Pain Pain Assessment: Faces Faces Pain Scale:  Hurts little more Pain Location: RLE Pain Descriptors / Indicators: Sore;Discomfort;Grimacing Pain Intervention(s): Monitored during session;Repositioned    Home Living                      Prior Function            PT Goals (current goals can now be found in the care plan section) Progress towards PT goals: Progressing toward goals    Frequency    Min 3X/week      PT Plan Current plan remains appropriate    Co-evaluation PT/OT/SLP Co-Evaluation/Treatment: Yes Reason for Co-Treatment: For patient/therapist safety;Complexity of the patient's impairments (multi-system involvement) PT goals addressed during session:  Mobility/safety with mobility        AM-PAC PT "6 Clicks" Daily Activity  Outcome Measure  Difficulty turning over in bed (including adjusting bedclothes, sheets and blankets)?: Unable Difficulty moving from lying on back to sitting on the side of the bed? : Unable Difficulty sitting down on and standing up from a chair with arms (e.g., wheelchair, bedside commode, etc,.)?: Unable Help needed moving to and from a bed to chair (including a wheelchair)?: Total Help needed walking in hospital room?: Total Help needed climbing 3-5 steps with a railing? : Total 6 Click Score: 6    End of Session Equipment Utilized During Treatment: Gait belt;Oxygen Activity Tolerance: Patient tolerated treatment well Patient left: in bed;with family/visitor present;with call bell/phone within reach(chair position vital go bed) Nurse Communication: Mobility status PT Visit Diagnosis: Other abnormalities of gait and mobility (R26.89);Muscle weakness (generalized) (M62.81);Unsteadiness on feet (R26.81);Other symptoms and signs involving the nervous system (R29.898)     Time: 9244-6286 PT Time Calculation (min) (ACUTE ONLY): 36 min  Charges:  $Therapeutic Activity: 8-22 mins                     Wray Kearns, PT, DPT Acute Rehabilitation Services Pager [phone] Office [place] 08/06/2018, 12:41 PM

## 2018-08-06 NOTE — Progress Notes (Signed)
Occupational Therapy Treatment Patient Details Name: Edward [name] MRN: [id number] DOB: [birth year] Today's Date: 08/06/2018    History of present illness pt is a 77 y/o male admitted with neck pain and arm weakness s/p C3-7 ACDF on 9/16. Pt with aystole on 9/19, VDRF 9/19-9/26. PMhx: CAD, CKD, DM2, CABG, HTN, He sustained another cardiac arrest/PEA 9/29, received epi x3 and then developed VF requiring shock, total downtime documented as 7 minutes but head CT concerning of loss of gray and white matter. Reintubated 9/29. Extubated 10/3.    OT comments  This 77 yo male seen today in co-treat with PT to try vital-go-lift bed for first time. Pt tolerated well with no significant changes in BP. He did report tightness and stretching in Bil calves in standing. Pt using his RUE to pull himself to midline in standing and sitting with still limited shoulder ROM Bil. He is very motivated and asking when we will be back. He would greatly benefit from continued acute OT with follow up OT on CIR.  Follow Up Recommendations  CIR;Supervision/Assistance - 24 hour    Equipment Recommendations  None recommended by OT       Precautions / Restrictions Precautions Precautions: Fall;Cervical Precaution Booklet Issued: No Precaution Comments: NG tube Restrictions Weight Bearing Restrictions: No       Mobility Bed Mobility               General bed mobility comments: Deferred; used tilt feature on vital go bed  Transfers                 General transfer comment: Performed tilt on the vital go bed to 50 degrees placing 62lbs through BLEs, no dizziness. VSS        ADL either performed or assessed with clinical judgement        Vision Baseline Vision/History: Wears glasses Wears Glasses: Reading only Patient Visual Report: No change from baseline            Cognition Arousal/Alertness: Awake/alert Behavior During Therapy: WFL for tasks assessed/performed Overall Cognitive  Status: No family/caregiver present to determine baseline cognitive functioning Area of Impairment: Attention;Following commands                   Current Attention Level: Sustained   Following Commands: Follows one step commands inconsistently;Follows one step commands with increased time     Problem Solving: Slow processing;Requires verbal cues;Decreased initiation General Comments: Requires repetition to follow commands and increased tiem.         Exercises General Exercises - Lower Extremity Long Arc Quad: Both;5 reps;Seated(chair position vital go bed) Other Exercises Other Exercises: Performed mini squats standing in tilt bed holding 5sec x10 Other Exercises: Worked on lateral trunk lean towards left using LUE to pull to midline as pt with right bias      General Comments BP prior to tilting bed 128/62; @41  degrees tilt BP 119/62, @50  degrees tilt, BP 112/61--no dizziness reported throughout session. Pt with tendency to lean to right while in bed needing VCs to use LUE to on bed rail to pull himself back to midline. Pt able to partially pull himself forward from back of bed that was in chair positon by using hands on Bil lower bed rails    Pertinent Vitals/ Pain       Pain Assessment: Faces Faces Pain Scale: Hurts little more Pain Location: RLE with weight bearing Pain Descriptors / Indicators: Sore;Discomfort;Grimacing Pain Intervention(s): Monitored during session;Repositioned  Frequency  Min 2X/week        Progress Toward Goals  OT Goals(current goals can now be found in the care plan section)  Progress towards OT goals: Progressing toward goals     Plan Discharge plan needs to be updated    Co-evaluation    PT/OT/SLP Co-Evaluation/Treatment: Yes Reason for Co-Treatment: For patient/therapist safety PT goals addressed during session: Mobility/safety with mobility OT goals addressed during session: Strengthening/ROM      AM-PAC PT "6  Clicks" Daily Activity     Outcome Measure   Help from another person eating meals?: Total(NPO) Help from another person taking care of personal grooming?: A Lot Help from another person toileting, which includes using toliet, bedpan, or urinal?: Total Help from another person bathing (including washing, rinsing, drying)?: A Lot Help from another person to put on and taking off regular upper body clothing?: Total Help from another person to put on and taking off regular lower body clothing?: Total 6 Click Score: 8    End of Session    OT Visit Diagnosis: Other abnormalities of gait and mobility (R26.89);Muscle weakness (generalized) (M62.81)   Activity Tolerance Patient tolerated treatment well   Patient Left in bed;with call bell/phone within reach   Nurse Communication (how to use Vital go bed)        Time: 8757-9728 OT Time Calculation (min): 36 min  Charges: OT General Charges $OT Visit: 1 Visit OT Treatments $Therapeutic Activity: 8-22 mins  [place] [address], OTR/L [place] Pager [phone] Office [phone]

## 2018-08-07 DIAGNOSIS — M4712 Other spondylosis with myelopathy, cervical region: Secondary | ICD-10-CM | POA: Diagnosis not present

## 2018-08-07 DIAGNOSIS — E46 Unspecified protein-calorie malnutrition: Secondary | ICD-10-CM | POA: Diagnosis present

## 2018-08-07 DIAGNOSIS — J9811 Atelectasis: Secondary | ICD-10-CM | POA: Diagnosis not present

## 2018-08-07 DIAGNOSIS — Z6833 Body mass index (BMI) 33.0-33.9, adult: Secondary | ICD-10-CM | POA: Diagnosis not present

## 2018-08-07 DIAGNOSIS — M6281 Muscle weakness (generalized): Secondary | ICD-10-CM | POA: Diagnosis not present

## 2018-08-07 DIAGNOSIS — D631 Anemia in chronic kidney disease: Secondary | ICD-10-CM | POA: Diagnosis not present

## 2018-08-07 DIAGNOSIS — K219 Gastro-esophageal reflux disease without esophagitis: Secondary | ICD-10-CM | POA: Diagnosis present

## 2018-08-07 DIAGNOSIS — J811 Chronic pulmonary edema: Secondary | ICD-10-CM | POA: Diagnosis not present

## 2018-08-07 DIAGNOSIS — Z515 Encounter for palliative care: Secondary | ICD-10-CM | POA: Diagnosis not present

## 2018-08-07 DIAGNOSIS — Z9911 Dependence on respirator [ventilator] status: Secondary | ICD-10-CM | POA: Diagnosis not present

## 2018-08-07 DIAGNOSIS — J9601 Acute respiratory failure with hypoxia: Secondary | ICD-10-CM | POA: Diagnosis present

## 2018-08-07 DIAGNOSIS — N183 Chronic kidney disease, stage 3 (moderate): Secondary | ICD-10-CM | POA: Diagnosis present

## 2018-08-07 DIAGNOSIS — R5381 Other malaise: Secondary | ICD-10-CM | POA: Diagnosis not present

## 2018-08-07 DIAGNOSIS — Y92230 Patient room in hospital as the place of occurrence of the external cause: Secondary | ICD-10-CM | POA: Diagnosis not present

## 2018-08-07 DIAGNOSIS — E11649 Type 2 diabetes mellitus with hypoglycemia without coma: Secondary | ICD-10-CM | POA: Diagnosis not present

## 2018-08-07 DIAGNOSIS — M4802 Spinal stenosis, cervical region: Secondary | ICD-10-CM | POA: Diagnosis not present

## 2018-08-07 DIAGNOSIS — F05 Delirium due to known physiological condition: Secondary | ICD-10-CM | POA: Diagnosis not present

## 2018-08-07 DIAGNOSIS — I4901 Ventricular fibrillation: Secondary | ICD-10-CM | POA: Diagnosis not present

## 2018-08-07 DIAGNOSIS — Z6841 Body Mass Index (BMI) 40.0 and over, adult: Secondary | ICD-10-CM | POA: Diagnosis not present

## 2018-08-07 DIAGNOSIS — F0631 Mood disorder due to known physiological condition with depressive features: Secondary | ICD-10-CM | POA: Diagnosis present

## 2018-08-07 DIAGNOSIS — R109 Unspecified abdominal pain: Secondary | ICD-10-CM | POA: Diagnosis not present

## 2018-08-07 DIAGNOSIS — Z7189 Other specified counseling: Secondary | ICD-10-CM | POA: Diagnosis not present

## 2018-08-07 DIAGNOSIS — D649 Anemia, unspecified: Secondary | ICD-10-CM | POA: Diagnosis present

## 2018-08-07 DIAGNOSIS — R627 Adult failure to thrive: Secondary | ICD-10-CM | POA: Diagnosis not present

## 2018-08-07 DIAGNOSIS — G4730 Sleep apnea, unspecified: Secondary | ICD-10-CM | POA: Diagnosis present

## 2018-08-07 DIAGNOSIS — I25810 Atherosclerosis of coronary artery bypass graft(s) without angina pectoris: Secondary | ICD-10-CM | POA: Diagnosis not present

## 2018-08-07 DIAGNOSIS — G8220 Paraplegia, unspecified: Secondary | ICD-10-CM | POA: Diagnosis not present

## 2018-08-07 DIAGNOSIS — I25709 Atherosclerosis of coronary artery bypass graft(s), unspecified, with unspecified angina pectoris: Secondary | ICD-10-CM | POA: Diagnosis not present

## 2018-08-07 DIAGNOSIS — E785 Hyperlipidemia, unspecified: Secondary | ICD-10-CM | POA: Diagnosis present

## 2018-08-07 DIAGNOSIS — Z66 Do not resuscitate: Secondary | ICD-10-CM | POA: Diagnosis not present

## 2018-08-07 DIAGNOSIS — Z4789 Encounter for other orthopedic aftercare: Secondary | ICD-10-CM | POA: Diagnosis not present

## 2018-08-07 DIAGNOSIS — G709 Myoneural disorder, unspecified: Secondary | ICD-10-CM | POA: Diagnosis not present

## 2018-08-07 DIAGNOSIS — R1312 Dysphagia, oropharyngeal phase: Secondary | ICD-10-CM | POA: Diagnosis present

## 2018-08-07 DIAGNOSIS — R945 Abnormal results of liver function studies: Secondary | ICD-10-CM | POA: Diagnosis not present

## 2018-08-07 DIAGNOSIS — Y838 Other surgical procedures as the cause of abnormal reaction of the patient, or of later complication, without mention of misadventure at the time of the procedure: Secondary | ICD-10-CM | POA: Diagnosis not present

## 2018-08-07 DIAGNOSIS — I130 Hypertensive heart and chronic kidney disease with heart failure and stage 1 through stage 4 chronic kidney disease, or unspecified chronic kidney disease: Secondary | ICD-10-CM | POA: Diagnosis not present

## 2018-08-07 DIAGNOSIS — R410 Disorientation, unspecified: Secondary | ICD-10-CM | POA: Diagnosis not present

## 2018-08-07 DIAGNOSIS — M542 Cervicalgia: Secondary | ICD-10-CM | POA: Diagnosis present

## 2018-08-07 DIAGNOSIS — G931 Anoxic brain damage, not elsewhere classified: Secondary | ICD-10-CM | POA: Diagnosis present

## 2018-08-07 DIAGNOSIS — R262 Difficulty in walking, not elsewhere classified: Secondary | ICD-10-CM | POA: Diagnosis not present

## 2018-08-07 DIAGNOSIS — E1165 Type 2 diabetes mellitus with hyperglycemia: Secondary | ICD-10-CM | POA: Diagnosis not present

## 2018-08-07 DIAGNOSIS — I2510 Atherosclerotic heart disease of native coronary artery without angina pectoris: Secondary | ICD-10-CM | POA: Diagnosis present

## 2018-08-07 DIAGNOSIS — G9341 Metabolic encephalopathy: Secondary | ICD-10-CM | POA: Diagnosis present

## 2018-08-07 DIAGNOSIS — J690 Pneumonitis due to inhalation of food and vomit: Secondary | ICD-10-CM | POA: Diagnosis not present

## 2018-08-07 DIAGNOSIS — I5032 Chronic diastolic (congestive) heart failure: Secondary | ICD-10-CM | POA: Diagnosis not present

## 2018-08-07 DIAGNOSIS — E870 Hyperosmolality and hypernatremia: Secondary | ICD-10-CM | POA: Diagnosis not present

## 2018-08-07 DIAGNOSIS — R4189 Other symptoms and signs involving cognitive functions and awareness: Secondary | ICD-10-CM | POA: Diagnosis not present

## 2018-08-07 DIAGNOSIS — G250 Essential tremor: Secondary | ICD-10-CM | POA: Diagnosis present

## 2018-08-07 DIAGNOSIS — N400 Enlarged prostate without lower urinary tract symptoms: Secondary | ICD-10-CM | POA: Diagnosis not present

## 2018-08-07 DIAGNOSIS — E1122 Type 2 diabetes mellitus with diabetic chronic kidney disease: Secondary | ICD-10-CM | POA: Diagnosis present

## 2018-08-07 DIAGNOSIS — R041 Hemorrhage from throat: Secondary | ICD-10-CM | POA: Diagnosis not present

## 2018-08-07 DIAGNOSIS — Z7409 Other reduced mobility: Secondary | ICD-10-CM | POA: Diagnosis present

## 2018-08-07 DIAGNOSIS — M5412 Radiculopathy, cervical region: Secondary | ICD-10-CM | POA: Diagnosis not present

## 2018-08-07 DIAGNOSIS — E440 Moderate protein-calorie malnutrition: Secondary | ICD-10-CM | POA: Diagnosis not present

## 2018-08-07 DIAGNOSIS — I97121 Postprocedural cardiac arrest following other surgery: Secondary | ICD-10-CM | POA: Diagnosis not present

## 2018-08-07 DIAGNOSIS — I469 Cardiac arrest, cause unspecified: Secondary | ICD-10-CM | POA: Diagnosis not present

## 2018-08-07 LAB — GLUCOSE, CAPILLARY
GLUCOSE-CAPILLARY: 125 mg/dL — AB (ref 70–99)
Glucose-Capillary: 121 mg/dL — ABNORMAL HIGH (ref 70–99)
Glucose-Capillary: 167 mg/dL — ABNORMAL HIGH (ref 70–99)
Glucose-Capillary: 156 mg/dL — ABNORMAL HIGH (ref 70–99)

## 2018-08-07 MED ORDER — CLOPIDOGREL BISULFATE 75 MG PO TABS: 75.0000 mg | ORAL_TABLET | Freq: Every day | ORAL | 0 refills | Status: AC

## 2018-08-07 MED ORDER — ROSUVASTATIN CALCIUM 20 MG PO TABS: 20.0000 mg | ORAL_TABLET | Freq: Every day | ORAL | Status: AC

## 2018-08-07 MED ORDER — JEVITY 1.2 CAL PO LIQD: 1000.0000 mL | ORAL | 0 refills | Status: AC

## 2018-08-07 MED ORDER — PROPRANOLOL HCL 20 MG/5ML PO SOLN: 20.0000 mg | Freq: Three times a day (TID) | ORAL | 12 refills | Status: AC

## 2018-08-07 MED ORDER — ENOXAPARIN SODIUM 40 MG/0.4ML ~~LOC~~ SOLN: 40.0000 mg | SUBCUTANEOUS | Status: AC

## 2018-08-07 MED ORDER — FENTANYL CITRATE (PF) 100 MCG/2ML IJ SOLN: 25.0000 ug | INTRAMUSCULAR | 0 refills | Status: AC | PRN

## 2018-08-07 MED ORDER — ASPIRIN 81 MG PO TABS: 81.0000 mg | ORAL_TABLET | Freq: Every day | ORAL | Status: AC

## 2018-08-07 MED ORDER — GUAIFENESIN-DM 100-10 MG/5ML PO SYRP: 10.0000 mL | ORAL_SOLUTION | Freq: Four times a day (QID) | ORAL | 0 refills | Status: AC | PRN

## 2018-08-07 MED ORDER — FAMOTIDINE 40 MG/5ML PO SUSR: 20.0000 mg | Freq: Every day | ORAL | 0 refills | Status: AC

## 2018-08-07 MED ORDER — HYDRALAZINE HCL 25 MG PO TABS: 25.0000 mg | ORAL_TABLET | Freq: Three times a day (TID) | ORAL | Status: AC

## 2018-08-07 MED ORDER — POLYVINYL ALCOHOL 1.4 % OP SOLN: 1.0000 [drp] | Freq: Three times a day (TID) | OPHTHALMIC | 0 refills | Status: AC

## 2018-08-07 MED ORDER — INSULIN ASPART 100 UNIT/ML ~~LOC~~ SOLN: 0.0000 [IU] | SUBCUTANEOUS | 11 refills | Status: AC

## 2018-08-07 MED ORDER — FREE WATER: 300.0000 mL | Freq: Three times a day (TID) | Status: AC

## 2018-08-07 MED ORDER — PRO-STAT SUGAR FREE PO LIQD: 30.0000 mL | Freq: Two times a day (BID) | ORAL | 0 refills | Status: AC

## 2018-08-07 MED ORDER — DICYCLOMINE HCL 10 MG/5ML PO SOLN: 10.0000 mg | Freq: Two times a day (BID) | ORAL | 12 refills | Status: AC

## 2018-08-07 MED ORDER — INSULIN DETEMIR 100 UNIT/ML ~~LOC~~ SOLN: 30.0000 [IU] | Freq: Two times a day (BID) | SUBCUTANEOUS | 11 refills | Status: AC

## 2018-08-07 MED ORDER — PRIMIDONE 50 MG PO TABS: 50.0000 mg | ORAL_TABLET | Freq: Two times a day (BID) | ORAL | Status: AC

## 2018-08-07 NOTE — Progress Notes (Signed)
  Speech Language Pathology Treatment: Dysphagia  Patient Details Name: Edward [name] MRN: [id number] DOB: 1941/08/15 Today's Date: 08/07/2018 Time: 0950-1020 SLP Time Calculation (min) (ACUTE ONLY): 30 min  Assessment / Plan / Recommendation Clinical Impression  Pts cognition is dramatically improving, he is quite appropriate and charming today but suffering. 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 He has travelled the world. His airway protection and breath support continue to improve, but still struggles to open UES for passage of small trials of ice, which result in hard upsetting coughing events. Pt does not even enjoy ice at this point due to hard coughing. Still suspect opening of the esophagus is partially obstructed by edema/hardware. May need more time for strength to improve and swelling to decrease. Effortful swallow are beneficial when pt can tolerate them. Will proceed with MBS later this week if pt still admitted.    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  On 9/19 began to notice difficulty swallowing and dysphonia, not able to even swallow his own saliva.  Had been ordered Decadron but this was not given.  He was found by nursing staff unresponsive and in asystole.  He underwent 1 round of ACLS with successful return of spontaneous circulation after 3 minutes. Possible anoxic injury. Iintubated from 9/19 to 9/26. CT shows no hematoma, but does show soft tissue swelling in pharyngeal wall. SLP evalauted pt on 9/27, pt not ready for POs, remained NPO and Cortrak placed. Pt reintubated on 9/30 and extubated on 10/3 with new DNR status.       SLP Plan  Continue with current plan of care       Recommendations  Diet  recommendations: NPO                Follow up Recommendations: LTACH SLP Visit Diagnosis: Dysphagia, pharyngoesophageal phase (R13.14) Plan: Continue with current plan of care       GO               [name], MA [place] Pager [phone] Office [phone]  [name] 08/07/2018, 11:25 AM

## 2018-08-07 NOTE — Progress Notes (Signed)
Patient discharging  To [place] belongings sent with Transport. Transport Via [employer]  Nurse call report.

## 2018-08-07 NOTE — Discharge Summary (Signed)
[facility]  Physician Discharge Summary   Patient ID: LIN GLAZIER MRN: [id number] DOB/AGE: 12/04/1940 77 y.o.  Admit date: 07/13/2018 Discharge date: 08/07/2018  PCP: Ann Held, DO  DISCHARGE DIAGNOSES:  Cardiac arrest History of coronary artery disease Chronic diastolic CHF Oropharyngeal dysphagia on a nasogastric feeding tube Aspiration pneumonia status post treatment Acute metabolic encephalopathy/anoxic brain injury, improving Tonic kidney disease stage III Diabetes mellitus type 2 Status post anterior cervical disc fusion on 9/16 Normocytic anemia   RECOMMENDATIONS FOR OUTPATIENT FOLLOW UP: 1. Patient being discharged to [facility] 2. Speech therapy to continue to follow the patient. 3. Palliative medicine to continue to follow with the patient  DISCHARGE CONDITION: fair  Diet recommendation: On tube feedings.  To remain n.p.o.  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 On 9/19 began to notice difficulty swallowing, not able to even swallow his own saliva. Had been ordered Decadron but this was not given. He was found by nursing staff unresponsive and in asystole. He underwent 1 round of ACLS with successful return of spontaneous circulation. Tx to ICU for hypothermia protocol.  He was stabilized.  Extubated on 9/26.  Had to be reintubated on 9/29.  Extensive discussion with family.  They declined tracheostomy and PEG tube.  He was made DNR.  One-way extubation on 10/3.    Significant Hospital Events   9/16: Anterior cervical disc fusion 2/99: Asystolic arrest. Estimated time to return of spontaneous circulation approximately 3 minutes. Unresponsive not  following commands postarrest so hypothermia protocol initiated. Goal temperature met at: 1030 pm  9/20: Hemodynamically stable on low-dose norepinephrine 9/21: completed rewarming. Paralytic and sedation discontinued. 9/22: paralysis/sedation and vasopressor therapy discontinued yesterday. Restlessness developed last night. IV fentanyl started. Reqired iv labetalol to treat HTN. Has a h/o HTN. 9/24>> Developed fever Vanc/Zosyn started 9/25 weaned off sedation and pressors. Weaning but copious secretions. 9/26>> Extubated to 4 L [place] 9/27>> Failed swallow, weaned to RA 9/29>> re-intubated 9/30>>>Spoke with family extensively, brother is POA, remains hypernatremic and slowly weaning. No trach/peg. One way extubation when optimized. 10/1>>Weaning on 12/5, transitioning off insulin gtt  08/01/2018 now off insulin drip. Weaning well. Awake and alert. 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 No hemorrhage, mass effect, or herniation 9/23 MRI cervical spine: No acute osseous or cord signal abnormality identified. Interval C3-C7 anterior cervical fusion and discectomy. Small prevertebral fluid collection extending into the right anterior neck compatible with postsurgical changes. No epidural collection. Mildly improved patency of the cervical spinal canal. Stable residual multilevel foraminal stenosis from uncovertebral and facet Hypertrophy. 9/23 MRI thoracic spine Mild thoracic spine spondylosis. No  high-grade foraminal or canal Stenosis 9/19: CT brain noncontrasted: Negative CT cervical spine 9/19: Status post C3-C7 ACDF with expected postoperative changes. No fracture or malalignment 2D echo 9/20 >>>Left ventricle: The cavity  size was normal. There was severe concentric hypertrophy. Systolic function was vigorous. The estimated ejection fraction was in the range of 65% to 70%. Wall motion was normal; there were no regional wall motion abnormalities. Doppler parameters are consistent with abnormal left ventricular relaxation (grade 1 diastolic dysfunction). Doppler parameters are consistent with elevated mean left atrial filling pressure. - Mitral valve: Valve area by pressure half-time: 2.04 cm^2. - Left atrium: The atrium was mildly dilated. MRI 9/30 with evidence of anoxic injury  Micro Data: 9/24>>Sputum >>GS>>Abundant WBC Moderate Gram + cocci, Few gram - rods>>Normal [place]  Antimicrobials:  Vanc 9/24>> 9/26 Zosyn 9/24>> 9/26    HOSPITAL COURSE:   Status post cardiac arrest Etiology unclear.  Patient was seen by cardiology.  Supportive care was recommended.  Patient was successfully resuscitated.  Was intubated in the intensive care unit.  Failed initial extubation on 9/26 and had to be intubated again on 9/29.  Discussions with family.  No tracheostomy or PEG tube.  Successfully extubated subsequently. Patient seems to be stable from a cardiac standpoint.   Echocardiogram done on 9/20 showed normal systolic function.  No wall motion abnormalities were noted.  Cardiology recommends reinitiating his medications including aspirin Plavix statin when he is able to.  We will reinitiate through feeding tube. Patient was also on amlodipine furosemide potassium and propranolol.   Propranolol was reinitiated.  Hold off on amlodipine furosemide and potassium for now.  History of coronary artery disease status post CABG Patient remains stable.    See above  regarding cardiac medications..  Blood pressure is noted to be well controlled.    Nursing note from last night suggested some chest heaviness.  Patient does not appear to be in any discomfort this morning.  Denies any complaints currently.  Vital signs are all stable.  No further work-up at this time.  Chronic diastolic CHF Appears to be stable from a volume standpoint.  Echocardiogram was done on 9/20 and showed normal systolic function.  Patient was on diuretics prior to admission.  Chest x-ray did not show any pulmonary edema.  Patient was on furosemide and potassium prior to admission.  These are being held for now.  Monitor weights carefully. If there is a gain in weight could consider resuming furosemide.  Acute respiratory failure with hypoxia One way extubation on 10/3.  Seems to be stable from a respiratory standpoint.  Oropharyngeal dysphagia Patient has a nasogastric feeding tube.    Speech therapy continued their evaluation.  Remains n.p.o.  Palliative medicine was also consulted.  Speech therapy to continue to follow the patient at the [facility].    Aspiration pneumonia Appears to have completed treatment with antibiotics.  Remains at high risk for aspiration.    Chest x-ray repeated few days ago did not show any obvious infiltrates.   Acute metabolic encephalopathy in the setting of possible anoxic brain injury/possible seizure Mental status appears to have improved.  Imaging studies suggested hypoxic ischemic injury.  Noted to be distracted but no obvious focal neurological deficits noted.  Patient was seen by neurology previously.  EEG did not show any epileptiform activity.  Patient was started on Keppra initially.  However neurology recommended discontinuing it once patient was extubated.  So this was discontinued on 10/6.    Chronic kidney disease stage III Renal function remains stable.  Continue to monitor urine output.  Diabetes mellitus type II Patient is on detemir.   CBGs are reasonably well controlled.  Continue SSI.  HbA1c was 9.5  in August.   Status post anterior cervical disc fusion on 9/16 Neurosurgery has been following.  He is off of steroids.  Seems to be stable.  Outpatient follow-up with neurosurgery: Dr. Zada Finders.  Normocytic anemia No evidence of overt bleeding.    Hemoglobin periodically.  Diarrhea Abdomen remains benign.  Continue Imodium as needed.    Abnormal LFTs Remarkably elevated AST ALT on 9/29.  Possibly due to cardiac arrest.  LFTs were normal on 10/6.  Goals of care This was done by critical care medicine initially and then by palliative care.  Patient's next of kin is his brother.  No reintubation.  DNR.  No feeding tubes.  No tracheostomy.  Palliative medicine continues to follow.  He has a nasogastric feeding tube which is a temporary solution.  Patient brother is aware of this.  Inpatient rehabilitation has been recommended which is reasonable.  If patient does not progress and if his dysphagia does not improve then may have to reconsider current approach.  Palliative medicine to continue to follow the patient at [facility].  DVT Prophylaxis: Lovenox    Code Status: DNR  Patient is stable for transfer to [place].  If this has been discussed by case manager with patient and his brother.   PERTINENT LABS:  The results of significant diagnostics from this hospitalization (including imaging, microbiology, ancillary and laboratory) are listed below for reference.    Microbiology: Recent Results (from the past 240 hour(s))  Culture, respiratory (non-expectorated)     Status: None   Collection Time: 07/29/18  1:02 AM  Result Value Ref Range Status   Specimen Description TRACHEAL ASPIRATE  Final   Special Requests NONE  Final   Gram Stain   Final    MODERATE WBC PRESENT, PREDOMINANTLY PMN FEW GRAM POSITIVE COCCI FEW GRAM POSITIVE RODS    Culture   Final    MODERATE Consistent with normal respiratory flora. Performed at  [facility], [place] [address], [place], [place] [address]    Report Status 08/01/2018 FINAL  Final     Labs: Basic Metabolic Panel: Recent Labs  Lab 08/02/18 0451 08/03/18 0357 08/04/18 0431 08/05/18 0622 08/06/18 0641  NA 140 143 142 140 137  K 4.1 3.8 4.5 3.6 4.3  CL 105 104 106 105 105  CO2 _0 GLUCOSE 215* 187* 177* 132* 172*  BUN 35* 26* _1 CREATININE 1.00 0.94 0.88 0.99 0.83  CALCIUM 7.9* 8.1* 8.2* 7.9* 7.9*  MG 2.2 2.0  --   --   --   PHOS 3.1 2.5  --   --   --    Liver Function Tests: Recent Labs  Lab 08/05/18 0622  AST 22  ALT 38  ALKPHOS 90  BILITOT 0.6  PROT 5.1*  ALBUMIN 1.9*   CBC: Recent Labs  Lab 08/01/18 0353 08/01/18 0835 08/02/18 0451 08/03/18 0357 08/04/18 0431  WBC 11.3* 14.7* 12.6* 11.8* 10.6*  NEUTROABS  --   --  9.2* 9.0*  --   HGB 8.6* 11.6* 11.2* 11.8* 11.3*  HCT 28.9* 38.4* 36.4* 38.0* 36.7*  MCV 94.8 93.0 91.2 90.3 91.8  PLT 203 272 299 317 254   BNP: BNP (last 3 results) Recent Labs    10/13/17 1027 03/08/18 1228 07/28/18 0250  BNP 27.3 100.9* 48.3     CBG: Recent Labs  Lab 08/06/18 1953 08/06/18 2352 08/07/18 0434 08/07/18 0808 08/07/18 1141  GLUCAP 163* 177* 125* 121* 167*  IMAGING STUDIES Ct Angio Head W Or Wo Contrast  Result Date: 07/09/2018 CLINICAL DATA:  Initial evaluation for neck pain with extremity weakness. EXAM: CT ANGIOGRAPHY HEAD AND NECK TECHNIQUE: Multidetector CT imaging of the head and neck was performed using the standard protocol during bolus administration of intravenous contrast. Multiplanar CT image reconstructions and MIPs were obtained to evaluate the vascular anatomy. Carotid stenosis measurements (when applicable) are obtained utilizing NASCET criteria, using the distal internal carotid diameter as the denominator. CONTRAST:  <See Chart> ISOVUE-370 IOPAMIDOL (ISOVUE-370) INJECTION 76% COMPARISON:  Prior CT from 05/22/2017. FINDINGS: CT HEAD FINDINGS Brain:  Age-related cerebral atrophy with mild chronic small vessel ischemic disease. No acute intracranial hemorrhage. No acute large vessel territory infarct. No mass lesion, midline shift or mass effect. No hydrocephalus. No extra-axial fluid collection. Vascular: No hyperdense vessel. Skull: Scalp soft tissues and calvarium within normal limits. Sinuses: Paranasal sinuses are clear. Trace opacity right mastoid air cells noted, of doubtful significance. Orbits: Globes and orbital soft tissues within normal limits. Review of the MIP images confirms the above findings CTA NECK FINDINGS Aortic arch: Visualized aortic arch of normal caliber with normal 3 vessel morphology. Sequelae of prior CABG partially visualized. Mild atheromatous plaque within the arch and about the origin of the great vessels without hemodynamically significant stenosis. Visualized subclavian arteries widely patent. Right carotid system: Right common carotid artery patent from its origin to the bifurcation without flow-limiting stenosis. Mild atheromatous irregularity about the right bifurcation with associated stenosis of up to 40% by NASCET criteria. Right ICA widely patent distally to the skull base without stenosis, dissection, or occlusion. Left carotid system: Soft atheromatous plaque within the right common carotid artery with secondary mild diffuse stenosis. Minimal calcified plaque about the left bifurcation without significant narrowing. Left ICA widely patent from the bifurcation to the skull base without stenosis, dissection, or occlusion. Vertebral arteries: Both of the vertebral arteries arise from the subclavian arteries. Focal plaque at the origin of the vertebral arteries bilaterally with associated moderate approximate 50% stenosis on the left and fairly severe stenosis on the right. Left vertebral artery dominant. Vertebral arteries otherwise widely patent within the neck without stenosis, dissection, or occlusion. Skeleton: No acute  osseous abnormality. No discrete lytic or blastic osseous lesions. Other neck: No acute soft tissue abnormality within the neck. Few scattered subcentimeter hypodense thyroid nodules noted, of doubtful significance given size and patient age. Upper chest: Visualized upper chest demonstrates no acute finding. Partially visualized lungs are grossly clear. Review of the MIP images confirms the above findings CTA HEAD FINDINGS Anterior circulation: Petrous segments widely patent bilaterally. Mild atheromatous plaque within the cavernous/supraclinoid ICAs without hemodynamically significant stenosis. ICA termini widely patent. A1 segments, anterior communicating artery common anterior cerebral arteries widely patent bilaterally. M1 segments widely patent without stenosis. No proximal M2 occlusion. Distal MCA branches well perfused and symmetric. Distal small vessel atheromatous irregularity noted. Posterior circulation: Mild atheromatous irregularity within the dominant left vertebral artery without hemodynamically significant stenosis. Focal moderate distal right V4 segment just beyond the takeoff of the right PICA. Posterior inferior cerebral arteries patent bilaterally. Basilar artery diminutive but widely patent to its distal aspect. Superior cerebral arteries patent bilaterally. Hypoplastic P1 segments with predominant fetal type origin of the PCAs. Moderate to advanced multifocal atheromatous irregularity throughout the PCAs bilaterally with associated moderate to severe multifocal stenoses. Venous sinuses: Patent. Anatomic variants: Fetal type origin of the PCAs.  No aneurysm. Delayed phase: No abnormal enhancement. Review of the MIP images confirms  the above findings IMPRESSION: 1. Negative CTA for large vessel occlusion. No other acute vascular abnormality within the head and neck. 2. Moderate intracranial atherosclerotic change, most notable within the bilateral PCAs and distal MCA branches. No other proximal  high-grade or correctable stenosis identified. 3. 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 Left vertebral artery dominant. Predominant fetal type origin of the PCAs with overall diminutive vertebrobasilar system. 4. Atheromatous plaque about the right carotid bifurcation with associated stenosis of up to approximately 40% by NASCET criteria. Electronically Signed   By: Jeannine Boga M.D.   On: 07/09/2018 21:44   Dg Cervical Spine 1 View  Result Date: 07/16/2018 CLINICAL DATA:  C3-7 ACDF. EXAM: DG CERVICAL SPINE - 1 VIEW; DG C-ARM 61-120 MIN COMPARISON:  Cervical MRI 07/09/2018. FINDINGS: A single lateral spot fluoroscopic image of the cervical spine is submitted. Small Field-of-view does not allow accurate numbering of the cervical segments. There is an anterior plate and screws, the inferior components of which are not well visualized. No complications are identified. IMPRESSION: Lateral view following cervical fusion. The inferior aspect of the hardware is incompletely visualized, and small field-of-view does not allow accurate numbering of the cervical segments. Electronically Signed   By: Richardean Sale M.D.   On: 07/16/2018 17:30   Ct Head Wo Contrast  Result Date: 07/29/2018 CLINICAL DATA:  Altered level of consciousness. History of cardiac arrest July 19, 2018 resulting in hypoxic ischemic injury. Cardiac arrest July 28, 2018. EXAM: CT HEAD WITHOUT CONTRAST TECHNIQUE: Contiguous axial images were obtained from the base of the skull through the vertex without intravenous contrast. COMPARISON:  MRI of the head July 23, 2018 and CT HEAD July 19, 2018 FINDINGS: BRAIN: No intraparenchymal hemorrhage, mass effect nor midline shift. The ventricles and sulci are normal for age. Mild generally decreased gray-white matter differentiation. No acute large vascular territory infarcts. No abnormal extra-axial  fluid collections. Basal cisterns are patent. VASCULAR: Mild calcific atherosclerosis of the carotid siphons. SKULL: No skull fracture. Old nasal bone fractures. No significant scalp soft tissue swelling. SINUSES/ORBITS: Trace paranasal sinus mucosal thickening. Mastoid air cells are well aerated.The included ocular globes and orbital contents are non-suspicious. Status post bilateral ocular lens implants. OTHER: Life-support lines in place. IMPRESSION: Early changes of hypoxic ischemic injury. These results will be called to the ordering clinician or representative by the Radiologist Assistant, and communication documented in the PACS or zVision Dashboard. Electronically Signed   By: Elon Alas M.D.   On: 07/29/2018 03:15   Ct Head Wo Contrast  Result Date: 07/19/2018 CLINICAL DATA:  Syncopal episode, suspect cardiac etiology. Status post ACDF yesterday. EXAM: CT HEAD WITHOUT CONTRAST CT CERVICAL SPINE WITHOUT CONTRAST TECHNIQUE: Multidetector CT imaging of the head and cervical spine was performed following the standard protocol without intravenous contrast. Multiplanar CT image reconstructions of the cervical spine were also generated. COMPARISON:  CT angiogram head and neck July 09, 2018 and MRI cervical spine June 08, 2018. FINDINGS: CT HEAD FINDINGS BRAIN: No intraparenchymal hemorrhage, mass effect nor midline shift. The ventricles and sulci are normal for age. Patchy supratentorial white matter hypodensities within normal range for patient's age, though non-specific are most compatible with chronic small vessel ischemic disease. No acute large vascular territory infarcts. No abnormal extra-axial fluid collections. Basal cisterns are patent. VASCULAR: Mild calcific atherosclerosis of the carotid siphons. SKULL: No skull fracture. Old mildly depressed nasal bone fractures. No significant scalp soft  tissue swelling. SINUSES/ORBITS: Trace paranasal sinus mucosal thickening. Mastoid air cells are  well aerated.The included ocular globes and orbital contents are non-suspicious. Status post bilateral ocular lens implants. OTHER: Patient is intubated. CT CERVICAL SPINE FINDINGS ALIGNMENT: Maintained lordosis. Vertebral bodies in alignment. SKULL BASE AND VERTEBRAE: Cervical vertebral bodies and posterior elements are intact. Status post C3 through C7 ACDF with restored disc height, intact well-seated hardware. No fracture. Moderate LEFT C4-5 facet arthropathy. C1-2 articulation with mild arthropathy. 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 Congenital canal narrowing. SOFT TISSUES AND SPINAL CANAL: Mild prevertebral soft tissue swelling consistent with recent surgery. Mild calcific atherosclerosis carotid bifurcations. Patient is intubated. DISC LEVELS: Moderate to severe C4-5, C5-6 neural foraminal narrowing. No osseous canal stenosis. UPPER CHEST: Lung apices are clear. OTHER: None. IMPRESSION: CT HEAD: 1. Stable negative noncontrast CT HEAD for age. CT CERVICAL SPINE: 1. Status post C3 through C7 ACDF with expected postoperative changes. 2. No fracture or malalignment. Electronically Signed   By: Elon Alas M.D.   On: 07/19/2018 18:35   Ct Angio Neck W And/or Wo Contrast  Result Date: 07/09/2018 CLINICAL DATA:  Initial evaluation for neck pain with extremity weakness. EXAM: CT ANGIOGRAPHY HEAD AND NECK TECHNIQUE: Multidetector CT imaging of the head and neck was performed using the standard protocol during bolus administration of intravenous contrast. Multiplanar CT image reconstructions and MIPs were obtained to evaluate the vascular anatomy. Carotid stenosis measurements (when applicable) are obtained utilizing NASCET criteria, using the distal internal carotid diameter as the denominator. CONTRAST:  <See Chart> ISOVUE-370 IOPAMIDOL (ISOVUE-370) INJECTION 76% COMPARISON:  Prior CT from 05/22/2017. FINDINGS: CT HEAD FINDINGS Brain: Age-related cerebral atrophy with  mild chronic small vessel ischemic disease. No acute intracranial hemorrhage. No acute large vessel territory infarct. No mass lesion, midline shift or mass effect. No hydrocephalus. No extra-axial fluid collection. Vascular: No hyperdense vessel. Skull: Scalp soft tissues and calvarium within normal limits. Sinuses: Paranasal sinuses are clear. Trace opacity right mastoid air cells noted, of doubtful significance. Orbits: Globes and orbital soft tissues within normal limits. Review of the MIP images confirms the above findings CTA NECK FINDINGS Aortic arch: Visualized aortic arch of normal caliber with normal 3 vessel morphology. Sequelae of prior CABG partially visualized. Mild atheromatous plaque within the arch and about the origin of the great vessels without hemodynamically significant stenosis. Visualized subclavian arteries widely patent. Right carotid system: Right common carotid artery patent from its origin to the bifurcation without flow-limiting stenosis. Mild atheromatous irregularity about the right bifurcation with associated stenosis of up to 40% by NASCET criteria. Right ICA widely patent distally to the skull base without stenosis, dissection, or occlusion. Left carotid system: Soft atheromatous plaque within the right common carotid artery with secondary mild diffuse stenosis. Minimal calcified plaque about the left bifurcation without significant narrowing. Left ICA widely patent from the bifurcation to the skull base without stenosis, dissection, or occlusion. Vertebral arteries: Both of the vertebral arteries arise from the subclavian arteries. Focal plaque at the origin of the vertebral arteries bilaterally with associated moderate approximate 50% stenosis on the left and fairly severe stenosis on the right. Left vertebral artery dominant. Vertebral arteries otherwise widely patent within the neck without stenosis, dissection, or occlusion. Skeleton: No acute osseous abnormality. No discrete  lytic or blastic osseous lesions. Other neck: No acute soft tissue abnormality within the neck. Few scattered subcentimeter hypodense thyroid nodules noted, of doubtful significance given size and patient age. Upper chest: Visualized upper chest  demonstrates no acute finding. Partially visualized lungs are grossly clear. Review of the MIP images confirms the above findings CTA HEAD FINDINGS Anterior circulation: Petrous segments widely patent bilaterally. Mild atheromatous plaque within the cavernous/supraclinoid ICAs without hemodynamically significant stenosis. ICA termini widely patent. A1 segments, anterior communicating artery common anterior cerebral arteries widely patent bilaterally. M1 segments widely patent without stenosis. No proximal M2 occlusion. Distal MCA branches well perfused and symmetric. Distal small vessel atheromatous irregularity noted. Posterior circulation: Mild atheromatous irregularity within the dominant left vertebral artery without hemodynamically significant stenosis. Focal moderate distal right V4 segment just beyond the takeoff of the right PICA. Posterior inferior cerebral arteries patent bilaterally. Basilar artery diminutive but widely patent to its distal aspect. Superior cerebral arteries patent bilaterally. Hypoplastic P1 segments with predominant fetal type origin of the PCAs. Moderate to advanced multifocal atheromatous irregularity throughout the PCAs bilaterally with associated moderate to severe multifocal stenoses. Venous sinuses: Patent. Anatomic variants: Fetal type origin of the PCAs.  No aneurysm. Delayed phase: No abnormal enhancement. Review of the MIP images confirms the above findings IMPRESSION: 1. Negative CTA for large vessel occlusion. No other acute vascular abnormality within the head and neck. 2. Moderate intracranial atherosclerotic change, most notable within the bilateral PCAs and distal MCA branches. No other proximal high-grade or correctable stenosis  identified. 3. 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 Left vertebral artery dominant. Predominant fetal type origin of the PCAs with overall diminutive vertebrobasilar system. 4. Atheromatous plaque about the right carotid bifurcation with associated stenosis of up to approximately 40% by NASCET criteria. Electronically Signed   By: Jeannine Boga M.D.   On: 07/09/2018 21:44   Ct Cervical Spine Wo Contrast  Result Date: 07/19/2018 CLINICAL DATA:  Syncopal episode, suspect cardiac etiology. Status post ACDF yesterday. EXAM: CT HEAD WITHOUT CONTRAST CT CERVICAL SPINE WITHOUT CONTRAST TECHNIQUE: Multidetector CT imaging of the head and cervical spine was performed following the standard protocol without intravenous contrast. Multiplanar CT image reconstructions of the cervical spine were also generated. COMPARISON:  CT angiogram head and neck July 09, 2018 and MRI cervical spine June 08, 2018. FINDINGS: CT HEAD FINDINGS BRAIN: No intraparenchymal hemorrhage, mass effect nor midline shift. The ventricles and sulci are normal for age. Patchy supratentorial white matter hypodensities within normal range for patient's age, though non-specific are most compatible with chronic small vessel ischemic disease. No acute large vascular territory infarcts. No abnormal extra-axial fluid collections. Basal cisterns are patent. VASCULAR: Mild calcific atherosclerosis of the carotid siphons. SKULL: No skull fracture. Old mildly depressed nasal bone fractures. No significant scalp soft tissue swelling. SINUSES/ORBITS: Trace paranasal sinus mucosal thickening. Mastoid air cells are well aerated.The included ocular globes and orbital contents are non-suspicious. Status post bilateral ocular lens implants. OTHER: Patient is intubated. CT CERVICAL SPINE FINDINGS ALIGNMENT: Maintained lordosis. Vertebral bodies in alignment. SKULL BASE AND  VERTEBRAE: Cervical vertebral bodies and posterior elements are intact. Status post C3 through C7 ACDF with restored disc height, intact well-seated hardware. No fracture. Moderate LEFT C4-5 facet arthropathy. C1-2 articulation with mild arthropathy. 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 Congenital canal narrowing. SOFT TISSUES AND SPINAL CANAL: Mild prevertebral soft tissue swelling consistent with recent surgery. Mild calcific atherosclerosis carotid bifurcations. Patient is intubated. DISC LEVELS: Moderate to severe C4-5, C5-6 neural foraminal narrowing. No osseous canal stenosis. UPPER CHEST: Lung apices are clear. OTHER: None. IMPRESSION: CT HEAD: 1. Stable negative  noncontrast CT HEAD for age. CT CERVICAL SPINE: 1. Status post C3 through C7 ACDF with expected postoperative changes. 2. No fracture or malalignment. Electronically Signed   By: Elon Alas M.D.   On: 07/19/2018 18:35   Mr Brain Wo Contrast  Result Date: 07/29/2018 CLINICAL DATA:  Encephalopathy. Recent cervical fusion spinal surgery. Cardiac arrest, with hypoxic ischemic insult. Decreased level of consciousness following second cardiac arrest. EXAM: MRI HEAD WITHOUT CONTRAST TECHNIQUE: Multiplanar, multiecho pulse sequences of the brain and surrounding structures were obtained without intravenous contrast. COMPARISON:  CT head 07/29/2018. MR head 07/23/2018. MR head 07/13/2018. FINDINGS: Brain: Abnormal T2 and FLAIR hyperintense lentiform nuclei, corresponding restricted diffusion, consistent with hypoxic ischemic injury, but no significant progression from 07/23/2018. Caudate involvement is not established. Moderate atrophy, stable. Chronic microvascular ischemic change, not progressed. Vascular: Normal flow voids. Skull and upper cervical spine: Normal marrow signal. Sinuses/Orbits: No concerning features. BILATERAL cataract extraction. Other: Layering fluid in the nasopharynx.  Trace mastoid effusions.  IMPRESSION: Abnormal T2 and FLAIR hyperintense lentiform nuclei, corresponding restricted diffusion consistent with acute hypoxic ischemic injury, but no significant progression from 07/23/2018. Chronic changes as described. Electronically Signed   By: Staci Righter M.D.   On: 07/29/2018 14:09   Mr Brain Wo Contrast  Result Date: 07/24/2018 CLINICAL DATA:  77 y/o M; paraparesis following cardiac arrest and spinal surgery. EXAM: MRI HEAD WITHOUT CONTRAST MRI CERVICAL SPINE WITHOUT CONTRAST MRI THORACIC SPINE WITHOUT CONTRAST TECHNIQUE: Multiplanar, multiecho pulse sequences of the brain and surrounding structures, and cervical spine, to include the craniocervical junction and cervicothoracic junction, were obtained without intravenous contrast. COMPARISON:  07/19/2018 CT head. 07/13/2018 MRI head. 07/09/2018 MRI of the cervical spine. 03/10/2018 CT of the chest. FINDINGS: MRI HEAD FINDINGS Brain: Increased T2 signal within the caudate nuclei and the lentiform nuclei with heterogeneous areas of reduced diffusion compatible with hypoxic ischemic injury of the brain. No associated hemorrhage or mass effect. No additional significant structural or signal abnormality of the brain identified. No extra-axial collection, hydrocephalus, focal mass effect, effacement of basilar cisterns, or herniation. Vascular: Normal flow voids. Skull and upper cervical spine: Normal marrow signal. Sinuses/Orbits: Mild paranasal sinus mucosal thickening and mastoid opacification likely related to intubation. Other: None. MRI CERVICAL SPINE FINDINGS Alignment: Physiologic. Vertebrae: Interval C3-C7 anterior cervical fusion and discectomy. There is prevertebral edema through the levels of surgery extending into the right anterior neck compatible postsurgical changes. No findings of bone marrow edema or discitis. No epidural fluid collection. Cord: Normal signal and morphology. Posterior Fossa, vertebral arteries, paraspinal tissues: As  above. Disc levels: C2-3: Stable uncovertebral and facet hypertrophy with mild bilateral foraminal stenosis. No canal stenosis. C3-4: Discectomy and anterior fusion. Stable residual uncovertebral and facet hypertrophy with moderate bilateral foraminal stenosis. Improved canal patency, mild residual stenosis. C4-5: Discectomy and anterior fusion. Residual endplate marginal osteophytes as well as bilateral uncovertebral and facet hypertrophy. Severe bilateral foraminal stenosis. Stable moderate canal stenosis. C5-6: Discectomy and anterior fusion. Residual endplate marginal osteophytes as well as uncovertebral and facet hypertrophy. Stable severe foraminal stenosis. Improved patency of the spinal canal with mild residual stenosis. C6-7: Discectomy and anterior fusion. Residual endplate marginal osteophytes with uncovertebral and facet hypertrophy. Improved patency of the spinal canal with mild residual stenosis. No significant foraminal stenosis. C7-T1: Stable small disc bulge and facet hypertrophy. Mild canal stenosis. No significant foraminal stenosis. MRI THORACIC SPINE FINDINGS Alignment:  Physiologic. Vertebrae: T6-7 mild endplate edema, likely degenerative. No loss of vertebral body height. No findings of fracture  or discitis. Cord:  Normal signal and morphology. Paraspinal and other soft tissues: Negative. Disc levels: Multilevel disc desiccation with loss of intervertebral disc space height. Mild T8-9, T9-10, and T12-L1 disc bulges with ventral thecal sac effacement. No significant canal stenosis. There is multilevel facet hypertrophy most pronounced at the T5-T7 as well as T10-T12 levels with there is mild foraminal encroachment. IMPRESSION: MRI head: 1. Caudate and lentiform nuclei increased T2 signal and heterogeneous reduced diffusion compatible with hypoxic ischemic injury of the brain in the setting of cardiac arrest. 2. No hemorrhage, mass effect, or herniation. MRI cervical spine: 1. No acute osseous  or cord signal abnormality identified. 2. Interval C3-C7 anterior cervical fusion and discectomy. Small prevertebral fluid collection extending into the right anterior neck compatible with postsurgical changes. No epidural collection. 3. Mildly improved patency of the cervical spinal canal. Stable residual multilevel foraminal stenosis from uncovertebral and facet hypertrophy. MRI thoracic spine: 1. No acute osseous or cord signal abnormality identified. 2. Mild thoracic spine spondylosis. No high-grade foraminal or canal stenosis. Electronically Signed   By: Kristine Garbe M.D.   On: 07/24/2018 00:10   Mr Brain Wo Contrast  Result Date: 07/13/2018 CLINICAL DATA:  Initial evaluation for acute dizziness. EXAM: MRI HEAD WITHOUT CONTRAST TECHNIQUE: Multiplanar, multiecho pulse sequences of the brain and surrounding structures were obtained without intravenous contrast. COMPARISON:  Prior MRI from 07/09/2018. FINDINGS: Brain: Mild atrophy with chronic small vessel ischemic disease. Small remote lacunar infarct present within the right thalamus. No acute intracranial infarct. No evidence for acute or chronic intracranial hemorrhage. No mass lesion, midline shift or mass effect. No hydrocephalus. No extra-axial fluid collection. Pituitary gland within normal limits. Vascular: Major intracranial vascular flow voids maintained. Skull and upper cervical spine: Craniocervical junction within normal limits. Upper cervical spine normal. No focal marrow replacing lesion. Scalp soft tissues demonstrate no acute finding. Sinuses/Orbits: Sequelae of prior ocular lens replacement bilaterally. Paranasal sinuses clear. Small bilateral mastoid effusions noted, of doubtful significance. Other: None. IMPRESSION: 1. No acute intracranial abnormality. 2. Mild atrophy with chronic small vessel ischemic disease, with small remote right thalamic lacunar infarct, stable. Electronically Signed   By: Jeannine Boga M.D.   On:  07/13/2018 20:08   Mr Brain W And Wo Contrast  Result Date: 07/10/2018 CLINICAL DATA:  77 y/o M; chief complaint of neck pain with bilateral upper and lower extremity weakness. EXAM: MRI HEAD WITHOUT AND WITH CONTRAST MRI CERVICAL SPINE WITHOUT AND WITH CONTRAST TECHNIQUE: Multiplanar, multiecho pulse sequences of the brain and surrounding structures, and cervical spine, to include the craniocervical junction and cervicothoracic junction, were obtained without and with intravenous contrast. CONTRAST:  10 cc Gadavist. COMPARISON:  07/09/2018 CTA head and neck. FINDINGS: MRI HEAD FINDINGS Brain: No acute infarction, hemorrhage, hydrocephalus, extra-axial collection or mass lesion. Few nonspecific T2 FLAIR hyperintensities in subcortical and periventricular white matter are compatible with mild chronic microvascular ischemic changes for age. Mild volume loss of the brain. No abnormal enhancement. Vascular: Normal flow voids. Skull and upper cervical spine: Normal marrow signal. Sinuses/Orbits: Mild maxillary sinus mucosal thickening and partial opacification of mastoid air cells. Bilateral intra-ocular lens replacement. Other: None. MRI CERVICAL SPINE FINDINGS Alignment: Physiologic. Vertebrae: Mild right-sided C5-6 facet edema and small facet effusions at the left-sided C3-4 and C4-5 levels, likely degenerative. No findings of fracture or discitis. Cord: No abnormal cord signal.  No abnormal enhancement. Posterior Fossa, vertebral arteries, paraspinal tissues: Negative. Disc levels: C2-3: Right greater than left uncovertebral and facet hypertrophy. Mild  right foraminal stenosis. No canal stenosis. C3-4: Disc osteophyte complex with bilateral uncovertebral and facet hypertrophy. Moderate bilateral foraminal and moderate canal stenosis. C4-5: Disc osteophyte complex eccentric with bilateral uncovertebral and facet hypertrophy. Severe bilateral foraminal stenosis. Moderate canal stenosis and right anterior cord  impingement cord flattening. C5-6: Disc osteophyte complex with bilateral uncovertebral and facet hypertrophy. Severe bilateral foraminal stenosis and moderate canal stenosis. C6-7: Disc osteophyte complex with left central disc protrusion. Moderate canal stenosis with left central anterior cord impingement. No significant foraminal stenosis. C7-T1: Small disc bulge and facet hypertrophy. Mild canal stenosis. No significant foraminal stenosis. IMPRESSION: MRI head: 1. No acute intracranial process or abnormal enhancement. 2. Mild chronic microvascular ischemic changes and volume loss of the brain. MRI cervical spine: 1. Mild right C5-6 as well as left C3-4 and C4-5 facet effusions, likely degenerative. 2. Advanced cervical spondylosis with disc and facet degenerative changes greatest at the C3-4 through C6-7 levels. 3. Moderate C3-4 through C6-7 canal stenosis. Right anterior C4-5 and left anterior C6-7 cord impingement. No abnormal cord signal. 4. Severe C4-5 and C5-6 bilateral foraminal stenosis. Multilevel mild and moderate foraminal stenosis. Electronically Signed   By: Kristine Garbe M.D.   On: 07/10/2018 00:56   Mr Cervical Spine Wo Contrast  Result Date: 07/24/2018 CLINICAL DATA:  77 y/o M; paraparesis following cardiac arrest and spinal surgery. EXAM: MRI HEAD WITHOUT CONTRAST MRI CERVICAL SPINE WITHOUT CONTRAST MRI THORACIC SPINE WITHOUT CONTRAST TECHNIQUE: Multiplanar, multiecho pulse sequences of the brain and surrounding structures, and cervical spine, to include the craniocervical junction and cervicothoracic junction, were obtained without intravenous contrast. COMPARISON:  07/19/2018 CT head. 07/13/2018 MRI head. 07/09/2018 MRI of the cervical spine. 03/10/2018 CT of the chest. FINDINGS: MRI HEAD FINDINGS Brain: Increased T2 signal within the caudate nuclei and the lentiform nuclei with heterogeneous areas of reduced diffusion compatible with hypoxic ischemic injury of the brain. No  associated hemorrhage or mass effect. No additional significant structural or signal abnormality of the brain identified. No extra-axial collection, hydrocephalus, focal mass effect, effacement of basilar cisterns, or herniation. Vascular: Normal flow voids. Skull and upper cervical spine: Normal marrow signal. Sinuses/Orbits: Mild paranasal sinus mucosal thickening and mastoid opacification likely related to intubation. Other: None. MRI CERVICAL SPINE FINDINGS Alignment: Physiologic. Vertebrae: Interval C3-C7 anterior cervical fusion and discectomy. There is prevertebral edema through the levels of surgery extending into the right anterior neck compatible postsurgical changes. No findings of bone marrow edema or discitis. No epidural fluid collection. Cord: Normal signal and morphology. Posterior Fossa, vertebral arteries, paraspinal tissues: As above. Disc levels: C2-3: Stable uncovertebral and facet hypertrophy with mild bilateral foraminal stenosis. No canal stenosis. C3-4: Discectomy and anterior fusion. Stable residual uncovertebral and facet hypertrophy with moderate bilateral foraminal stenosis. Improved canal patency, mild residual stenosis. C4-5: Discectomy and anterior fusion. Residual endplate marginal osteophytes as well as bilateral uncovertebral and facet hypertrophy. Severe bilateral foraminal stenosis. Stable moderate canal stenosis. C5-6: Discectomy and anterior fusion. Residual endplate marginal osteophytes as well as uncovertebral and facet hypertrophy. Stable severe foraminal stenosis. Improved patency of the spinal canal with mild residual stenosis. C6-7: Discectomy and anterior fusion. Residual endplate marginal osteophytes with uncovertebral and facet hypertrophy. Improved patency of the spinal canal with mild residual stenosis. No significant foraminal stenosis. C7-T1: Stable small disc bulge and facet hypertrophy. Mild canal stenosis. No significant foraminal stenosis. MRI THORACIC SPINE  FINDINGS Alignment:  Physiologic. Vertebrae: T6-7 mild endplate edema, likely degenerative. No loss of vertebral body height. No findings of fracture  or discitis. Cord:  Normal signal and morphology. Paraspinal and other soft tissues: Negative. Disc levels: Multilevel disc desiccation with loss of intervertebral disc space height. Mild T8-9, T9-10, and T12-L1 disc bulges with ventral thecal sac effacement. No significant canal stenosis. There is multilevel facet hypertrophy most pronounced at the T5-T7 as well as T10-T12 levels with there is mild foraminal encroachment. IMPRESSION: MRI head: 1. Caudate and lentiform nuclei increased T2 signal and heterogeneous reduced diffusion compatible with hypoxic ischemic injury of the brain in the setting of cardiac arrest. 2. No hemorrhage, mass effect, or herniation. MRI cervical spine: 1. No acute osseous or cord signal abnormality identified. 2. Interval C3-C7 anterior cervical fusion and discectomy. Small prevertebral fluid collection extending into the right anterior neck compatible with postsurgical changes. No epidural collection. 3. Mildly improved patency of the cervical spinal canal. Stable residual multilevel foraminal stenosis from uncovertebral and facet hypertrophy. MRI thoracic spine: 1. No acute osseous or cord signal abnormality identified. 2. Mild thoracic spine spondylosis. No high-grade foraminal or canal stenosis. Electronically Signed   By: Kristine Garbe M.D.   On: 07/24/2018 00:10   Mr Thoracic Spine Wo Contrast  Result Date: 07/24/2018 CLINICAL DATA:  77 y/o M; paraparesis following cardiac arrest and spinal surgery. EXAM: MRI HEAD WITHOUT CONTRAST MRI CERVICAL SPINE WITHOUT CONTRAST MRI THORACIC SPINE WITHOUT CONTRAST TECHNIQUE: Multiplanar, multiecho pulse sequences of the brain and surrounding structures, and cervical spine, to include the craniocervical junction and cervicothoracic junction, were obtained without intravenous  contrast. COMPARISON:  07/19/2018 CT head. 07/13/2018 MRI head. 07/09/2018 MRI of the cervical spine. 03/10/2018 CT of the chest. FINDINGS: MRI HEAD FINDINGS Brain: Increased T2 signal within the caudate nuclei and the lentiform nuclei with heterogeneous areas of reduced diffusion compatible with hypoxic ischemic injury of the brain. No associated hemorrhage or mass effect. No additional significant structural or signal abnormality of the brain identified. No extra-axial collection, hydrocephalus, focal mass effect, effacement of basilar cisterns, or herniation. Vascular: Normal flow voids. Skull and upper cervical spine: Normal marrow signal. Sinuses/Orbits: Mild paranasal sinus mucosal thickening and mastoid opacification likely related to intubation. Other: None. MRI CERVICAL SPINE FINDINGS Alignment: Physiologic. Vertebrae: Interval C3-C7 anterior cervical fusion and discectomy. There is prevertebral edema through the levels of surgery extending into the right anterior neck compatible postsurgical changes. No findings of bone marrow edema or discitis. No epidural fluid collection. Cord: Normal signal and morphology. Posterior Fossa, vertebral arteries, paraspinal tissues: As above. Disc levels: C2-3: Stable uncovertebral and facet hypertrophy with mild bilateral foraminal stenosis. No canal stenosis. C3-4: Discectomy and anterior fusion. Stable residual uncovertebral and facet hypertrophy with moderate bilateral foraminal stenosis. Improved canal patency, mild residual stenosis. C4-5: Discectomy and anterior fusion. Residual endplate marginal osteophytes as well as bilateral uncovertebral and facet hypertrophy. Severe bilateral foraminal stenosis. Stable moderate canal stenosis. C5-6: Discectomy and anterior fusion. Residual endplate marginal osteophytes as well as uncovertebral and facet hypertrophy. Stable severe foraminal stenosis. Improved patency of the spinal canal with mild residual stenosis. C6-7:  Discectomy and anterior fusion. Residual endplate marginal osteophytes with uncovertebral and facet hypertrophy. Improved patency of the spinal canal with mild residual stenosis. No significant foraminal stenosis. C7-T1: Stable small disc bulge and facet hypertrophy. Mild canal stenosis. No significant foraminal stenosis. MRI THORACIC SPINE FINDINGS Alignment:  Physiologic. Vertebrae: T6-7 mild endplate edema, likely degenerative. No loss of vertebral body height. No findings of fracture or discitis. Cord:  Normal signal and morphology. Paraspinal and other soft tissues: Negative. Disc levels: Multilevel  disc desiccation with loss of intervertebral disc space height. Mild T8-9, T9-10, and T12-L1 disc bulges with ventral thecal sac effacement. No significant canal stenosis. There is multilevel facet hypertrophy most pronounced at the T5-T7 as well as T10-T12 levels with there is mild foraminal encroachment. IMPRESSION: MRI head: 1. Caudate and lentiform nuclei increased T2 signal and heterogeneous reduced diffusion compatible with hypoxic ischemic injury of the brain in the setting of cardiac arrest. 2. No hemorrhage, mass effect, or herniation. MRI cervical spine: 1. No acute osseous or cord signal abnormality identified. 2. Interval C3-C7 anterior cervical fusion and discectomy. Small prevertebral fluid collection extending into the right anterior neck compatible with postsurgical changes. No epidural collection. 3. Mildly improved patency of the cervical spinal canal. Stable residual multilevel foraminal stenosis from uncovertebral and facet hypertrophy. MRI thoracic spine: 1. No acute osseous or cord signal abnormality identified. 2. Mild thoracic spine spondylosis. No high-grade foraminal or canal stenosis. Electronically Signed   By: Kristine Garbe M.D.   On: 07/24/2018 00:10   Mr Cervical Spine W Or Wo Contrast  Result Date: 07/10/2018 CLINICAL DATA:  77 y/o M; chief complaint of neck pain with  bilateral upper and lower extremity weakness. EXAM: MRI HEAD WITHOUT AND WITH CONTRAST MRI CERVICAL SPINE WITHOUT AND WITH CONTRAST TECHNIQUE: Multiplanar, multiecho pulse sequences of the brain and surrounding structures, and cervical spine, to include the craniocervical junction and cervicothoracic junction, were obtained without and with intravenous contrast. CONTRAST:  10 cc Gadavist. COMPARISON:  07/09/2018 CTA head and neck. FINDINGS: MRI HEAD FINDINGS Brain: No acute infarction, hemorrhage, hydrocephalus, extra-axial collection or mass lesion. Few nonspecific T2 FLAIR hyperintensities in subcortical and periventricular white matter are compatible with mild chronic microvascular ischemic changes for age. Mild volume loss of the brain. No abnormal enhancement. Vascular: Normal flow voids. Skull and upper cervical spine: Normal marrow signal. Sinuses/Orbits: Mild maxillary sinus mucosal thickening and partial opacification of mastoid air cells. Bilateral intra-ocular lens replacement. Other: None. MRI CERVICAL SPINE FINDINGS Alignment: Physiologic. Vertebrae: Mild right-sided C5-6 facet edema and small facet effusions at the left-sided C3-4 and C4-5 levels, likely degenerative. No findings of fracture or discitis. Cord: No abnormal cord signal.  No abnormal enhancement. Posterior Fossa, vertebral arteries, paraspinal tissues: Negative. Disc levels: C2-3: Right greater than left uncovertebral and facet hypertrophy. Mild right foraminal stenosis. No canal stenosis. C3-4: Disc osteophyte complex with bilateral uncovertebral and facet hypertrophy. Moderate bilateral foraminal and moderate canal stenosis. C4-5: Disc osteophyte complex eccentric with bilateral uncovertebral and facet hypertrophy. Severe bilateral foraminal stenosis. Moderate canal stenosis and right anterior cord impingement cord flattening. C5-6: Disc osteophyte complex with bilateral uncovertebral and facet hypertrophy. Severe bilateral foraminal  stenosis and moderate canal stenosis. C6-7: Disc osteophyte complex with left central disc protrusion. Moderate canal stenosis with left central anterior cord impingement. No significant foraminal stenosis. C7-T1: Small disc bulge and facet hypertrophy. Mild canal stenosis. No significant foraminal stenosis. IMPRESSION: MRI head: 1. No acute intracranial process or abnormal enhancement. 2. Mild chronic microvascular ischemic changes and volume loss of the brain. MRI cervical spine: 1. Mild right C5-6 as well as left C3-4 and C4-5 facet effusions, likely degenerative. 2. Advanced cervical spondylosis with disc and facet degenerative changes greatest at the C3-4 through C6-7 levels. 3. Moderate C3-4 through C6-7 canal stenosis. Right anterior C4-5 and left anterior C6-7 cord impingement. No abnormal cord signal. 4. Severe C4-5 and C5-6 bilateral foraminal stenosis. Multilevel mild and moderate foraminal stenosis. Electronically Signed   By: Kristine Garbe  M.D.   On: 07/10/2018 00:56   Dg Chest Port 1 View  Result Date: 08/05/2018 CLINICAL DATA:  Difficulty breathing EXAM: PORTABLE CHEST 1 VIEW COMPARISON:  08/02/2018 FINDINGS: Prior CABG. Cardiomegaly. Low lung volumes with bibasilar atelectasis. No confluent opacities, effusions or edema. IMPRESSION: Low lung volumes, bibasilar atelectasis. Electronically Signed   By: Rolm Baptise M.D.   On: 08/05/2018 14:06   Dg Chest Port 1 View  Result Date: 08/02/2018 CLINICAL DATA:  New onset chest pain. EXAM: PORTABLE CHEST 1 VIEW COMPARISON:  08/01/2018 FINDINGS: The ET tube tip is above the carina. Enteric tube tip is below the field of view. Heart size normal. Bibasilar atelectasis identified, unchanged. No airspace opacities, pulmonary edema or pleural effusion noted. IMPRESSION: 1. Persistent bibasilar atelectasis. Electronically Signed   By: Kerby Moors M.D.   On: 08/02/2018 08:40   Dg Chest Port 1 View  Result Date: 08/01/2018 CLINICAL DATA:   Check endotracheal tube placement EXAM: PORTABLE CHEST 1 VIEW COMPARISON:  07/31/2018 FINDINGS: Endotracheal tube and feeding catheter are noted and in stable position. Bibasilar atelectasis is again seen but slightly improved. Cardiac shadow is stable. Postsurgical changes are again noted. No sizable effusion or pneumothorax is noted. IMPRESSION: Stable bibasilar atelectasis. Tubes and lines as described. Electronically Signed   By: Inez Catalina M.D.   On: 08/01/2018 08:36   Dg Chest Port 1 View  Result Date: 07/31/2018 CLINICAL DATA:  Intubation, respiratory failure, type II diabetes mellitus, coronary artery disease, hypertension, CHF EXAM: PORTABLE CHEST 1 VIEW COMPARISON:  Portable exam 0607 hours compared to 07/29/2018 FINDINGS: Tip of endotracheal tube projects 2.0 cm above carina. Feeding tube extends into stomach. Normal heart size post CABG and coronary stenting. Mediastinal contours and pulmonary vascularity normal. Atherosclerotic calcifications aorta. Bibasilar atelectasis. No infiltrate, pleural effusion, or pneumothorax. BILATERAL glenohumeral degenerative changes. IMPRESSION: Bibasilar atelectasis. Electronically Signed   By: Lavonia Dana M.D.   On: 07/31/2018 08:14   Dg Chest Port 1 View  Result Date: 07/29/2018 CLINICAL DATA:  Post intubation EXAM: PORTABLE CHEST 1 VIEW COMPARISON:  07/28/2018 FINDINGS: Endotracheal tube terminates 3 cm above the carina. Enteric tube courses below the diaphragm. Low lung volumes. Left basilar atelectasis. No frank interstitial edema. No pleural effusion or pneumothorax. The heart is normal in size. Defibrillator pads overlying the left hemithorax. Cervical spine fixation hardware. IMPRESSION: Endotracheal tube terminates 3 cm above the carina. Enteric tube courses below the diaphragm. Electronically Signed   By: Julian Hy M.D.   On: 07/29/2018 00:53   Dg Chest Port 1 View  Result Date: 07/28/2018 CLINICAL DATA:  Respiratory failure EXAM: PORTABLE  CHEST 1 VIEW COMPARISON:  July 26, 2018 FINDINGS: Nasogastric tube and central catheter have been removed. No pneumothorax. There is slight left base atelectasis. Lungs elsewhere are clear. There is cardiomegaly with pulmonary vascularity normal. No adenopathy. Patient is status post coronary artery bypass grafting. No bone lesions. IMPRESSION: No pneumothorax. Mild left base atelectasis. Lungs elsewhere clear. There is stable cardiomegaly. Electronically Signed   By: Lowella Grip III M.D.   On: 07/28/2018 08:34   Dg Chest Port 1 View  Result Date: 07/26/2018 CLINICAL DATA:  Check endotracheal tube placement, respiratory failure EXAM: PORTABLE CHEST 1 VIEW COMPARISON:  07/25/2018 FINDINGS: Cardiac shadow is mildly prominent but accentuated by the portable technique. Endotracheal tube, nasogastric catheter and left jugular central line are again seen and stable. No pneumothorax is noted. Bibasilar atelectatic changes are again seen and stable. No new focal abnormality is noted.  IMPRESSION: Stable appearance of the chest from the previous exam Electronically Signed   By: Inez Catalina M.D.   On: 07/26/2018 09:30   Dg Chest Port 1 View  Result Date: 07/25/2018 CLINICAL DATA:  Endotracheal tube placement. EXAM: PORTABLE CHEST 1 VIEW COMPARISON:  Earlier the same date and 07/24/2018. FINDINGS: 1105 hours. The tip of the endotracheal tube appears unchanged, approximately 4 cm below the thoracic inlet. Left IJ central venous catheter extends to the level of the superior cavoatrial junction. Enteric tube projects below the diaphragm. The heart size and mediastinal contours are stable status post median sternotomy and CABG. Bibasilar aeration has mildly improved. There is no pneumothorax or significant pleural effusion. Previous cervical fusion and bilateral glenohumeral degenerative changes are noted. IMPRESSION: Stable position of the support system. Mildly improved bibasilar atelectasis. Electronically  Signed   By: Richardean Sale M.D.   On: 07/25/2018 11:36   Dg Chest Port 1 View  Result Date: 07/25/2018 CLINICAL DATA:  Endotracheal tube EXAM: PORTABLE CHEST 1 VIEW COMPARISON:  07/24/2018 FINDINGS: Endotracheal tube, NG tube, left jugular central venous catheter are stable. No pneumothorax. Low volumes. Bibasilar atelectasis. Normal heart size. Postoperative changes from CABG. IMPRESSION: Stable bibasilar atelectasis. Electronically Signed   By: Marybelle Killings M.D.   On: 07/25/2018 11:11   Dg Chest Portable 1 View  Result Date: 07/24/2018 CLINICAL DATA:  Check endotracheal tube placement EXAM: PORTABLE CHEST 1 VIEW COMPARISON:  07/21/2018 FINDINGS: Cardiac shadow remains mildly enlarged. Postsurgical changes are again seen. Endotracheal tube is again seen but slightly withdrawn when compared with the prior exam. Nasogastric catheter and left jugular central line are again noted and stable. Bibasilar atelectasis is noted slightly worse on the right than the left on the current exam opposite from that seen on the prior study. No bony abnormality is seen. IMPRESSION: Bibasilar atelectatic changes right greater than left somewhat opposite of that seen on the prior exam. Electronically Signed   By: Inez Catalina M.D.   On: 07/24/2018 10:24   Dg Chest Port 1 View  Result Date: 07/21/2018 CLINICAL DATA:  Acute respiratory failure. EXAM: PORTABLE CHEST 1 VIEW COMPARISON:  07/20/2018 and 07/19/2018 and 03/24/2018 FINDINGS: Endotracheal tube tip is approximately 18 mm above the carina. Central line tip is in the cavoatrial junction, 5.0 cm below the carina. NG tube tip is below the diaphragm. Heart size and vascularity are normal. Slight bibasilar atelectasis, essentially unchanged. Pulmonary vascularity is normal. No acute bone abnormality. IMPRESSION: No significant change since the prior study. Slight bibasilar atelectasis, left greater than right. Electronically Signed   By: Lorriane Shire M.D.   On:  07/21/2018 08:23   Dg Chest Port 1 View  Result Date: 07/20/2018 CLINICAL DATA:  Check endotracheal tube placement EXAM: PORTABLE CHEST 1 VIEW COMPARISON:  07/19/18 FINDINGS: Left central venous line, nasogastric catheter and endotracheal tube are noted in satisfactory position. The overall inspiratory effort is again poor. Bibasilar atelectasis is noted and stable. No pneumothorax is seen. No bony abnormality is noted. Postsurgical changes are noted. Cardiac shadow is mildly enlarged. IMPRESSION: Tubes and lines in satisfactory position. Bibasilar atelectatic changes stable from the previous exam. Electronically Signed   By: Inez Catalina M.D.   On: 07/20/2018 07:38   Dg Chest Port 1 View  Result Date: 07/19/2018 CLINICAL DATA:  Line and tube placement EXAM: PORTABLE CHEST 1 VIEW COMPARISON:  None. FINDINGS: Endotracheal tube tip is at the level of the clavicular heads. Orogastric tube tip projects over the stomach.  Gastric side port also projects over the stomach. Left IJ approach central venous catheter tip is at the cavoatrial junction. Remote CABG sequelae. Shallow lung inflation with left basilar atelectasis. Mild cardiomegaly. No focal airspace consolidation. IMPRESSION: Endotracheal tube tip at the level of the clavicular heads. Orogastric tube tip and side port project over the stomach. Left IJ approach CVC tip at the cavoatrial junction. Electronically Signed   By: Ulyses Jarred M.D.   On: 07/19/2018 19:52   Dg C-arm 1-60 Min  Result Date: 07/16/2018 CLINICAL DATA:  C3-7 ACDF. EXAM: DG CERVICAL SPINE - 1 VIEW; DG C-ARM 61-120 MIN COMPARISON:  Cervical MRI 07/09/2018. FINDINGS: A single lateral spot fluoroscopic image of the cervical spine is submitted. Small Field-of-view does not allow accurate numbering of the cervical segments. There is an anterior plate and screws, the inferior components of which are not well visualized. No complications are identified. IMPRESSION: Lateral view following  cervical fusion. The inferior aspect of the hardware is incompletely visualized, and small field-of-view does not allow accurate numbering of the cervical segments. Electronically Signed   By: Richardean Sale M.D.   On: 07/16/2018 17:30   Dg C-arm 1-60 Min  Result Date: 07/16/2018 CLINICAL DATA:  C3-7 ACDF. EXAM: DG CERVICAL SPINE - 1 VIEW; DG C-ARM 61-120 MIN COMPARISON:  Cervical MRI 07/09/2018. FINDINGS: A single lateral spot fluoroscopic image of the cervical spine is submitted. Small Field-of-view does not allow accurate numbering of the cervical segments. There is an anterior plate and screws, the inferior components of which are not well visualized. No complications are identified. IMPRESSION: Lateral view following cervical fusion. The inferior aspect of the hardware is incompletely visualized, and small field-of-view does not allow accurate numbering of the cervical segments. Electronically Signed   By: Richardean Sale M.D.   On: 07/16/2018 17:30   Dg C-arm 1-60 Min  Result Date: 07/16/2018 CLINICAL DATA:  C3-7 ACDF. EXAM: DG CERVICAL SPINE - 1 VIEW; DG C-ARM 61-120 MIN COMPARISON:  Cervical MRI 07/09/2018. FINDINGS: A single lateral spot fluoroscopic image of the cervical spine is submitted. Small Field-of-view does not allow accurate numbering of the cervical segments. There is an anterior plate and screws, the inferior components of which are not well visualized. No complications are identified. IMPRESSION: Lateral view following cervical fusion. The inferior aspect of the hardware is incompletely visualized, and small field-of-view does not allow accurate numbering of the cervical segments. Electronically Signed   By: Richardean Sale M.D.   On: 07/16/2018 17:30    DISCHARGE EXAMINATION: Vitals:   08/07/18 0232 08/07/18 0435 08/07/18 0805 08/07/18 1147  BP:  (!) 140/55 (!) 121/53 (!) 124/58  Pulse:  67 68 66  Resp:  _0 Temp:  98.2 F (36.8 C) 97.7 F (36.5 C) 97.9 F (36.6 C)    TempSrc:  Oral Oral Oral  SpO2:  100% 100% 99%  Weight: 121.8 kg     Height:       General appearance: alert, cooperative, appears stated age, distracted and no distress Resp: Normal effort.  Coarse breath sounds bilaterally. Cardio: regular rate and rhythm, S1, S2 normal, no murmur, click, rub or gallop GI: soft, non-tender; bowel sounds normal; no masses,  no organomegaly  DISPOSITION: [facility], [facility]  Discharge Instructions    Discharge instructions   Complete by:  As directed    Please review instructions on the discharge summary.  You were cared for by a hospitalist during your hospital stay. If you have any questions  about your discharge medications or the care you received while you were in the hospital after you are discharged, you can call the unit and asked to speak with the hospitalist on call if the hospitalist that took care of you is not available. Once you are discharged, your primary care physician will handle any further medical issues. Please note that NO REFILLS for any discharge medications will be authorized once you are discharged, as it is imperative that you return to your primary care physician (or establish a relationship with a primary care physician if you do not have one) for your aftercare needs so that they can reassess your need for medications and monitor your lab values. If you do not have a primary care physician, you can call [phone] for a physician referral.   Increase activity slowly   Complete by:  As directed         Allergies as of 08/07/2018      Reactions   Pneumococcal Vaccines Anaphylaxis      Medication List    STOP taking these medications   amLODipine 10 MG tablet Commonly known as:  NORVASC   aspirin EC 81 MG tablet Replaced by:  aspirin 81 MG tablet   bismuth subsalicylate 366 YQ/03KV suspension Commonly known as:  PEPTO BISMOL   clonazePAM 1 MG tablet Commonly known as:  KLONOPIN   cyclobenzaprine 5 MG tablet Commonly  known as:  FLEXERIL   diclofenac sodium 1 % Gel Commonly known as:  VOLTAREN   dicyclomine 10 MG capsule Commonly known as:  BENTYL Replaced by:  dicyclomine 10 MG/5ML syrup   erythromycin ophthalmic ointment   esomeprazole 40 MG capsule Commonly known as:  NEXIUM   furosemide 40 MG tablet Commonly known as:  LASIX   gabapentin 300 MG capsule Commonly known as:  NEURONTIN   Insulin Syringe-Needle U-100 31G X 5/16" 1 ML Misc   meclizine 25 MG tablet Commonly known as:  ANTIVERT   nitroGLYCERIN 0.4 MG SL tablet Commonly known as:  NITROSTAT   NOVOLIN N RELION 100 UNIT/ML injection Generic drug:  insulin NPH Human   oxyCODONE-acetaminophen 5-325 MG tablet Commonly known as:  PERCOCET/ROXICET   polyethylene glycol packet Commonly known as:  MIRALAX / GLYCOLAX   potassium chloride SA 20 MEQ tablet Commonly known as:  K-DUR,KLOR-CON   propranolol 40 MG tablet Commonly known as:  INDERAL Replaced by:  propranolol 20 MG/5ML solution   REFRESH OP   senna-docusate 8.6-50 MG tablet Commonly known as:  Senokot-S     TAKE these medications   albuterol 108 (90 Base) MCG/ACT inhaler Commonly known as:  PROVENTIL HFA;VENTOLIN HFA inhale 2 puffs by mouth every 6 hours if needed for wheezing or shortness of breath   aspirin 81 MG tablet Place 1 tablet (81 mg total) into feeding tube daily. Replaces:  aspirin EC 81 MG tablet   clopidogrel 75 MG tablet Commonly known as:  PLAVIX Place 1 tablet (75 mg total) into feeding tube daily. Hold plavix for the surgery, restart it as per neuro surgery. What changed:  how to take this   dicyclomine 10 MG/5ML syrup Commonly known as:  BENTYL Place 5 mLs (10 mg total) into feeding tube 2 (two) times daily. Replaces:  dicyclomine 10 MG capsule   enoxaparin 40 MG/0.4ML injection Commonly known as:  LOVENOX Inject 0.4 mLs (40 mg total) into the skin daily. Start taking on:  08/08/2018   famotidine 40 MG/5ML suspension Commonly  known as:  PEPCID Place  2.5 mLs (20 mg total) into feeding tube daily. Start taking on:  08/08/2018   feeding supplement (JEVITY 1.2 CAL) Liqd Place 1,000 mLs into feeding tube continuous.   feeding supplement (PRO-STAT SUGAR FREE 64) Liqd Place 30 mLs into feeding tube 2 (two) times daily.   fentaNYL 100 MCG/2ML injection Commonly known as:  SUBLIMAZE Inject 0.5 mLs (25 mcg total) into the vein every hour as needed for moderate pain.   free water Soln Place 300 mLs into feeding tube every 8 (eight) hours.   guaiFENesin-dextromethorphan 100-10 MG/5ML syrup Commonly known as:  ROBITUSSIN DM Place 10 mLs into feeding tube every 6 (six) hours as needed for cough.   hydrALAZINE 25 MG tablet Commonly known as:  APRESOLINE Place 1 tablet (25 mg total) into feeding tube every 8 (eight) hours. What changed:    how to take this  when to take this   insulin aspart 100 UNIT/ML injection Commonly known as:  novoLOG Inject 0-20 Units into the skin every 4 (four) hours. What changed:    how much to take  when to take this   insulin detemir 100 UNIT/ML injection Commonly known as:  LEVEMIR Inject 0.2 mLs (20 Units total) into the skin 2 (two) times daily.   insulin detemir 100 UNIT/ML injection Commonly known as:  LEVEMIR Inject 0.3 mLs (30 Units total) into the skin 2 (two) times daily.   polyvinyl alcohol 1.4 % ophthalmic solution Commonly known as:  LIQUIFILM TEARS Place 1 drop into both eyes 3 (three) times daily.   prednisoLONE acetate 1 % ophthalmic suspension Commonly known as:  PRED FORTE Place 2 drops into both eyes 2 (two) times daily as needed for itching.   primidone 50 MG tablet Commonly known as:  MYSOLINE Place 1 tablet (50 mg total) into feeding tube 2 (two) times daily. What changed:  how to take this   propranolol 20 MG/5ML solution Commonly known as:  INDERAL Place 5 mLs (20 mg total) into feeding tube 3 (three) times daily. Replaces:  propranolol 40  MG tablet   rosuvastatin 20 MG tablet Commonly known as:  CRESTOR Place 1 tablet (20 mg total) into feeding tube daily. What changed:  how to take this         Contact information for follow-up providers    Schedule an appointment as soon as possible for a visit  with Carollee Herter, Alferd Apa, DO.   Specialty:  Family Medicine Contact information: [address] [place] [address] [phone]            Contact information for after-discharge care    Destination    [facility] .   Service:  Skilled Nursing Contact information: [address] [place] [phone]                  TOTAL DISCHARGE TIME: 25 minutes  Bonnielee Haff  [facility] Pager [phone]  08/07/2018, 12:07 PM

## 2018-08-07 NOTE — Progress Notes (Signed)
Physical Therapy Treatment Patient Details Name: Edward [name] MRN: [id number] DOB: 1941-07-25 Today's Date: 08/07/2018    History of Present Illness pt is a 77 y/o male admitted with neck pain and arm weakness s/p C3-7 ACDF on 9/16. Pt with aystole on 9/19, VDRF 9/19-9/26. PMhx: CAD, CKD, DM2, CABG, HTN, He sustained another cardiac arrest/PEA 9/29, received epi x3 and then developed VF requiring shock, total downtime documented as 7 minutes but head CT concerning of loss of gray and white matter. Reintubated 9/29. Extubated 10/3.     PT Comments    Pt supine in bed on arrival.  Obtained BP pre session and with each tilt.  Pt reports dizziness after first tilt.  Waited in 1st tilt until symptoms subsided, patient performed UE exercises and ADLs to encourage activity in weight bearing position.  Pt limited in final tilt but tolerated session well.  Placed in chair position post session.    BP pre tx: 108/89 BP 1st tilt: 26 degrees, placing 79#, and BP 109/61 BP 2nd tilt: 43 degrees, placing 115#, and BP 126/56    Follow Up Recommendations  CIR;Supervision/Assistance - 24 hour     Equipment Recommendations  None recommended by PT    Recommendations for Other Services       Precautions / Restrictions Precautions Precautions: Fall;Cervical Precaution Booklet Issued: No Precaution Comments: NG tube Restrictions Weight Bearing Restrictions: No    Mobility  Bed Mobility               General bed mobility comments: Deferred; used tilt feature on vital go bed, pre application of straps required +2 assistance to boost patient to [facility] to allow for accurate placement and position.  +2 total assistance to scoot in supine with bed pad.    Transfers                 General transfer comment: Performed tilt on the bed at 26 degrees placing 79# complaints of dizziness and BP 109/61, performed UE exercises in position and symptoms subsides, tilt increased to 43 degrees placing  115# of pressure, BP 126/56.    Ambulation/Gait                 Stairs             Wheelchair Mobility    Modified Rankin (Stroke Patients Only)       Balance                                            Cognition Arousal/Alertness: Awake/alert Behavior During Therapy: WFL for tasks assessed/performed Overall Cognitive Status: No family/caregiver present to determine baseline cognitive functioning Area of Impairment: Attention;Following commands                       Following Commands: Follows one step commands inconsistently;Follows one step commands with increased time Safety/Judgement: Decreased awareness of safety;Decreased awareness of deficits   Problem Solving: Slow processing;Requires verbal cues;Decreased initiation General Comments: Requires repetition to follow commands and increased time.        Exercises Shoulder Exercises Shoulder Flexion: AROM;Both;Supine;10 reps Shoulder Extension: AROM;Both;10 reps;Supine    General Comments        Pertinent Vitals/Pain Pain Assessment: No/denies pain Pain Location: RLE with weight bearing    Home Living  Prior Function            PT Goals (current goals can now be found in the care plan section) Acute Rehab PT Goals Patient Stated Goal: Pt pointing to chair to get up  Potential to Achieve Goals: Fair Progress towards PT goals: Progressing toward goals    Frequency    Min 3X/week      PT Plan Current plan remains appropriate    Co-evaluation              AM-PAC PT "6 Clicks" Daily Activity  Outcome Measure  Difficulty turning over in bed (including adjusting bedclothes, sheets and blankets)?: Unable Difficulty moving from lying on back to sitting on the side of the bed? : Unable Difficulty sitting down on and standing up from a chair with arms (e.g., wheelchair, bedside commode, etc,.)?: Unable Help needed moving to and  from a bed to chair (including a wheelchair)?: Total Help needed walking in hospital room?: Total Help needed climbing 3-5 steps with a railing? : Total 6 Click Score: 6    End of Session Equipment Utilized During Treatment: Oxygen Activity Tolerance: Patient tolerated treatment well Patient left: in bed;with family/visitor present;with call bell/phone within reach(chair position in the vital go bed.  ) Nurse Communication: Mobility status PT Visit Diagnosis: Other abnormalities of gait and mobility (R26.89);Muscle weakness (generalized) (M62.81);Unsteadiness on feet (R26.81);Other symptoms and signs involving the nervous system (R29.898)     Time: 4196-2229 PT Time Calculation (min) (ACUTE ONLY): 27 min  Charges:  $Therapeutic Activity: 23-37 mins                     Governor Rooks, PTA Acute Rehabilitation Services Pager [phone] Office [phone]     Mikaylee Arseneau Eli Hose 08/07/2018, 4:52 PM

## 2018-08-07 NOTE — Progress Notes (Signed)
Daily Progress Note   Patient Name: Edward [name]       Date: 08/07/2018 DOB: 30-Jun-1941  Age: 77 y.o. MRN#: [id number] Attending Physician: Edward Haff, MD Primary Care Physician: Edward [name], Edward Apa, DO Admit Date: 07/13/2018  Reason for Consultation/Follow-up: Establishing goals of care  Subjective: I saw and examined Edward [name] today.  He is awake and alert and engages in conversation.  He is somewhat confused on details of his hospitalization, but certainly appears more interactive today that chart review reveals from prior palliative encounters.  Discussed with his concern regarding his swallowing and aspiration.  He is opposed to PEG tube and reports staff working on [facility] placement to give him more time to improve with NG tube.  Length of Stay: 25  Current Medications: Scheduled Meds:  . dicyclomine  10 mg Per Tube BID  . enoxaparin (LOVENOX) injection  40 mg Subcutaneous Q24H  . famotidine  20 mg Per Tube Daily  . feeding supplement (PRO-STAT SUGAR FREE 64)  30 mL Per Tube BID  . free water  300 mL Per Tube Q8H  . hydrALAZINE  25 mg Oral Q8H  . insulin aspart  0-20 Units Subcutaneous Q4H  . insulin detemir  30 Units Subcutaneous BID  . mouth rinse  15 mL Mouth Rinse BID  . polyvinyl alcohol  1 drop Both Eyes TID  . prednisoLONE acetate  2 drop Both Eyes TID AC & HS  . primidone  50 mg Per Tube BID  . propranolol  20 mg Per Tube TID  . rosuvastatin  20 mg Per Tube Once per day on Tue Thu    Continuous Infusions: . feeding supplement (JEVITY 1.2 CAL) 1,000 mL (08/05/18 2045)  . lactated ringers Stopped (07/30/18 1259)    PRN Meds: erythromycin, fentaNYL (SUBLIMAZE) injection, guaiFENesin-dextromethorphan  Physical Exam         Awake and alert.  He has a NGT  Coarse breath sounds bilaterally.  No wheezing rales or rhonchi. Cardio: S1-S2 is normal regular.    GI: Abdomen soft.  Nontender non distended  Extremities: Mild edema bilateral lower extremities.  Vital Signs: BP (!) 124/58 (BP Location: Right Arm)   Pulse 66   Temp 97.9 F (36.6 C) (Oral)   Resp 20   Ht 5\' 8"  (1.727 m)  Wt 121.8 kg   SpO2 99%   BMI 40.83 kg/m  SpO2: SpO2: 99 % O2 Device: O2 Device: Room Air O2 Flow Rate: O2 Flow Rate (L/min): 2.5 L/min  Intake/output summary:   Intake/Output Summary (Last 24 hours) at 08/07/2018 1533 Last data filed at 08/07/2018 0800 Gross per 24 hour  Intake 200 ml  Output -  Net 200 ml   LBM: Last BM Date: 08/07/18 Baseline Weight: Weight: 127 kg Most recent weight: Weight: 121.8 kg       Palliative Assessment/Data:    Flowsheet Rows     Most Recent Value  Intake Tab  Referral Department  Hospitalist  Unit at Time of Referral  Med/Surg Unit  Date Notified  08/03/18  Palliative Care Type  New Palliative care  Reason for referral  Clarify Goals of Care  Date of Admission  07/13/18  Date first seen by Palliative Care  08/04/18  # of days Palliative referral response time  1 Day(s)  # of days IP prior to Palliative referral  21  Clinical Assessment  Psychosocial & Spiritual Assessment  Palliative Care Outcomes      Patient Active Problem List   Diagnosis Date Noted  . Encounter for palliative care   . Goals of care, counseling/discussion   . Anoxic encephalopathy ([place])   . Endotracheal tube present   . Pressure injury of skin 07/28/2018  . Cardiac arrest ([place]) 07/19/2018  . Acute respiratory failure ([place])   . Acute metabolic encephalopathy   . Radiculopathy 07/13/2018  . Cervical spinal stenosis 07/10/2018  . Cervical radiculopathy 07/10/2018  . Cellulitis of left anterior lower leg 06/19/2018  . Numbness and tingling in both hands 06/19/2018  . Chronic daily headache 04/23/2018  . Palpitations 03/28/2018  .  Anxiety 07/24/2017  . Atelectasis   . Coronary artery disease involving coronary bypass graft of native heart with angina pectoris ([place])   . Hypertensive heart disease without heart failure   . Chronic kidney disease, stage III (moderate) ([place]) 11/03/2016  . Chronic diastolic heart failure ([employer])   . Near syncope 04/19/2016  . Diabetes mellitus ([place]) 03/11/2016  . GERD (gastroesophageal reflux disease) 08/27/2015  . Acute bronchitis 08/27/2015  . SOB (shortness of breath) 07/08/2015  . Obstructive sleep apnea 06/22/2015  . Essential tremor 04/23/2015  . Impacted cerumen of right ear 11/20/2014  . Hyperlipidemia LDL goal <70 01/29/2014  . Essential hypertension 01/29/2014    Palliative Care Assessment & Plan   Patient Profile:  77 yo gentleman with a history of diabetes cervical stenosis cord compression underwent surgery disc fusion of cervical spine. Patient underwent a PEA arrest afterwards and had a prolonged hospitalization course since then. Patient was on a ventilator, extubated on 9-26, re intubated on 9-29, one-way extubation performed on 08-02-18. It is reported on earlier goals of care discussions that the patient and family declined a tracheostomy and PEG tube and CODE STATUS was changed to DO NOT RESUSCITATE.   Assessment:  status postcardiac arrest History of coronary artery disease status post CABG Acute respiratory failure with hypoxia status post one-way extubation on 08-02-18 Ongoing oropharyngeal dysfunction, patient continues on nasogastric tube feedings, speech pathology following, high aspiration risk, likely to undergo modified barium swallow soon Aspiration pneumonia Multifactorial encephalopathy probably secondary to hypoxic ischemic injury, acute metabolic encephalopathy. Questionable seizures. History of anterior cervical disc fusion on 07-16-18 Stage III chronic kidney disease    Recommendations/Plan: Plan for transition to [facility] to see how much he can regain  in way of functional swallow.  Discussed concern that this may be irreversible decline and he acknowledged this.  Recommend continue palliative conversation regarding goals of care at [facility] based on his clinical course.   Code Status:    Code Status Orders  (From admission, onward)         Start     Ordered   07/29/18 1424  Do not attempt resuscitation (DNR)  Continuous    Question Answer Comment  In the event of cardiac or respiratory ARREST Do not call a "code blue"   In the event of cardiac or respiratory ARREST Do not perform Intubation, CPR, defibrillation or ACLS   In the event of cardiac or respiratory ARREST Use medication by any route, position, wound care, and other measures to relive pain and suffering. May use oxygen, suction and manual treatment of airway obstruction as needed for comfort.   Comments No CPR, No medications      07/29/18 1423        Code Status History    Date Active Date Inactive Code Status Order ID Comments User Context   07/19/2018 1731 07/29/2018 1423 Full Code [id number]  Aldean Jewett, MD Inpatient   07/10/2018 0238 07/12/2018 1751 Full Code [id number]  Vianne Bulls, MD ED   03/08/2018 1745 03/11/2018 1907 Full Code [id number]  Abigail Butts., PA-C ED   07/15/2017 1705 07/17/2017 1857 Full Code [id number]  Georgette Shell, MD ED   06/06/2017 1832 06/10/2017 1740 Full Code [id number]  Belva Crome, MD Inpatient   02/05/2017 2030 02/06/2017 1825 Full Code [id number]  Sid Falcon, MD Inpatient   10/29/2016 1741 11/15/2016 1619 Full Code [id number]  Charlie Pitter, PA-C Inpatient   04/19/2016 2213 04/22/2016 2149 Full Code [id number]  Vianne Bulls, MD ED   12/18/2015 1756 12/24/2015 1750 Full Code [id number]  Lonn Georgia, PA-C Inpatient   10/31/2015 0609 11/01/2015 1721 Full Code [id number]  Etta Quill, DO ED   06/04/2015 1912 06/05/2015 2336 Full Code [id number]  Almyra Deforest, [place] Inpatient   01/30/2015 1259 01/31/2015 1413 Full Code [id number]  Belva Crome,  MD Inpatient   07/30/2014 1549 07/31/2014 1608 Full Code [id number]  Newt Minion, MD Inpatient   04/16/2014 1646 04/17/2014 1514 Full Code [id number]  Geradine Girt, DO Inpatient   01/28/2014 1613 01/29/2014 1541 Full Code [id number]  Belva Crome, MD Inpatient   01/27/2014 1700 01/28/2014 1613 Full Code [id number]  Charlie Pitter, PA-C ED    Advance Directive Documentation     Most Recent Value  Type of Advance Directive  Living will  Pre-existing out of facility DNR order (yellow form or pink MOST form)  -  "MOST" Form in Place?  -       Prognosis:   guarded  Discharge Planning:  recommend palliative care to  follow the patient at [facility], for monitoring disease trajectory and to further engage with patient's family regarding overall goals of care.   Care plan was discussed with  Patient  Thank you for allowing the Palliative Medicine Team to assist in the care of this patient.   Time In: 1010 am  Time Out: 1040 Total Time 30 Prolonged Time Billed  no       Greater than 50%  of this time was spent counseling and coordinating care related to the above assessment and plan.  Micheline Rough, MD [place] Team [phone]  Please contact Palliative Medicine Team  phone at [phone] for questions and concerns.

## 2018-08-07 NOTE — Progress Notes (Signed)
   08/07/18 0115  Vitals  Temp 98.6 F (37 C)  Temp Source Oral  BP (!) 128/59  MAP (mmHg) 80  BP Location Right Arm  BP Method Automatic  Patient Position (if appropriate) Lying  Pulse Rate 67  Pulse Rate Source Dinamap  Resp 18   Pt was c/o of heaviness in his chest. Txt Paged K. Schorr. Pt VS above. After Pt went to sleep no other symptoms.

## 2018-08-07 NOTE — Consult Note (Signed)
   [facility] CM Inpatient Consult   08/07/2018  DAM ASHRAF 10-28-1941 [id number]   Update:  Patient to transition to [facility] at [place] today.  Natividad Brood, RN BSN [facility] Liaison  [phone] business mobile phone Toll free office [phone]

## 2018-08-07 NOTE — Progress Notes (Signed)
CSW alerted by [facility] that patient discharging to [facility], no further CSW needs at this time. CSW signing off.  Laveda Abbe, [place] Clinical Social Worker [phone]

## 2018-08-07 NOTE — Care Management Note (Addendum)
Case Management Note  Patient Details  Name: MACALLISTER ASHMEAD MRN: [id number] Date of Birth: [birth year]  Subjective/Objective:                    Action/Plan: Discussed patient at LOS this am. Pt seems appropriate for LTACH at d/c. CM discussed with MD and he is in agreement. CM spoke to the patient and his brother and they are in agreement. Their choice is [facility]. CM notified Raquel Sarna with [facility]. CM to meet with brother at the bedside this pm.    Addendum (1200): CM met with the patient and his brother. They both are in agreement with going to [place] with [facility] up to patients room to meet with them and provide them information.  CM updated MD that patient and brother in agreement and that [facility] can take him today.   1430: [facility] arranged for 6 pm per [facility] request. D/c summary faxed to: [phone]. Bedside RN provided number for report and room number. CM will call and update patients brother.   Expected Discharge Date:  07/19/18               Expected Discharge Plan:  Long Term Acute Care (LTAC)  In-House Referral:  Clinical Social Work  Discharge planning Services  CM Consult  Post Acute Care Choice:    Choice offered to:     DME Arranged:    DME Agency:     HH Arranged:    [place] Agency:     Status of Service:  Completed, signed off  If discussed at [employer], dates discussed:    Additional Comments:  Pollie Friar, RN 08/07/2018, 10:23 AM

## 2018-08-30 ENCOUNTER — Ambulatory Visit: Payer: Medicare Other | Admitting: Internal Medicine

## 2018-08-30 DIAGNOSIS — Z0289 Encounter for other administrative examinations: Secondary | ICD-10-CM

## 2018-08-31 DEATH — deceased

## 2018-09-24 ENCOUNTER — Encounter: Payer: Medicare Other | Admitting: Psychology

## 2018-11-13 ENCOUNTER — Encounter: Payer: Medicare Other | Admitting: Psychology

## 2018-12-13 ENCOUNTER — Encounter (INDEPENDENT_AMBULATORY_CARE_PROVIDER_SITE_OTHER): Payer: Medicare Other | Admitting: Ophthalmology

## 2018-12-15 NOTE — Progress Notes (Deleted)
Follow-up Visit   Date: 12/15/18    Edward [name] MRN: [id number] DOB: 06-18-1941   Interim History: Edward [name] is a 78 y.o. right-handed African American male with coronary artery disease, TIA (2002, manifested as right sided numbness), insulin dependent diabetes mellitus type 2, hypertension, hyperlipidemia, GERD and BPH returning to the clinic for follow-up of essential tremor.  The patient was accompanied to the clinic by self..  History of present illness: He moved from [place] in February 2015 to be closer to his brother. He has been diabetic since 1985 and in late 1990s, he developed numbness > tingling of the feet which seems to have been stable since onset. He has paresthesias below the level of the ankles, worse on the right side, as well as interemittent numbness/tingling of the right hand. He ambulates independently. He was started neurontin 100mg  twice daily which has been slowly titrated.  He was seeing Dr. Gery Pray in [place] for memory loss and headaches in 2014. Headaches improved significantly after he retired. He has short-term memory problems, which does not interefere with daily life. Mostly forgetting little details or tasks. He is driving and has not been involving in any accidents. He is able to do all his IADLs and ADLs.   Starting in 2015, he began having tremors of the hands and voice.  Family history was also notable for tremor in sister, mother, and two brothers have tremors.  He was started on propranolol 40mg  TID which helped some. Over the years, his tremors have gradually worsened. He was started on gabapentin 300mg  BID and noticed that clonazepman which he was taking for anxiety, helped.  This was discontinued in February 2019 which made hand tremors worse, therefore primidone was started.   UPDATE 06/18/2018:  He is here for follow-up visit.  He is pleased with effectiveness of primidone 50mg  twice daily, which controls tremors much better  than previously.  His headaches have resolved and memory is doing better.  His mood is doing much better because his family was visiting from [place].  He is also working on a new Clinical research associate book titled "At Home in [place]".    UPDATE 12/19/2018: ***  Medications:  Current Outpatient Medications on File Prior to Visit  Medication Sig Dispense Refill  . albuterol (VENTOLIN HFA) 108 (90 Base) MCG/ACT inhaler inhale 2 puffs by mouth every 6 hours if needed for wheezing or shortness of breath (Patient not taking: Reported on 07/13/2018) 18 g 1  . Amino Acids-Protein Hydrolys (FEEDING SUPPLEMENT, PRO-STAT SUGAR FREE 64,) LIQD Place 30 mLs into feeding tube 2 (two) times daily. 900 mL 0  . aspirin 81 MG tablet Place 1 tablet (81 mg total) into feeding tube daily. 30 tablet   . clopidogrel (PLAVIX) 75 MG tablet Place 1 tablet (75 mg total) into feeding tube daily. Hold plavix for the surgery, restart it as per neuro surgery. 90 tablet 0  . dicyclomine (BENTYL) 10 MG/5ML syrup Place 5 mLs (10 mg total) into feeding tube 2 (two) times daily. 240 mL 12  . enoxaparin (LOVENOX) 40 MG/0.4ML injection Inject 0.4 mLs (40 mg total) into the skin daily. 0 Syringe   . famotidine (PEPCID) 40 MG/5ML suspension Place 2.5 mLs (20 mg total) into feeding tube daily. 50 mL 0  . fentaNYL (SUBLIMAZE) 100 MCG/2ML injection Inject 0.5 mLs (25 mcg total) into the vein every hour as needed for moderate pain. 2 mL 0  . guaiFENesin-dextromethorphan (ROBITUSSIN DM) 100-10 MG/5ML syrup Place 10  mLs into feeding tube every 6 (six) hours as needed for cough. 118 mL 0  . hydrALAZINE (APRESOLINE) 25 MG tablet Place 1 tablet (25 mg total) into feeding tube every 8 (eight) hours.    . insulin aspart (NOVOLOG) 100 UNIT/ML injection Inject 0-20 Units into the skin every 4 (four) hours. 10 mL 11  . insulin detemir (LEVEMIR) 100 UNIT/ML injection Inject 0.2 mLs (20 Units total) into the skin 2 (two) times daily. 10 mL 11  . insulin detemir (LEVEMIR)  100 UNIT/ML injection Inject 0.3 mLs (30 Units total) into the skin 2 (two) times daily. 10 mL 11  . Nutritional Supplements (FEEDING SUPPLEMENT, JEVITY 1.2 CAL,) LIQD Place 1,000 mLs into feeding tube continuous.  0  . polyvinyl alcohol (LIQUIFILM TEARS) 1.4 % ophthalmic solution Place 1 drop into both eyes 3 (three) times daily. 15 mL 0  . prednisoLONE acetate (PRED FORTE) 1 % ophthalmic suspension Place 2 drops into both eyes 2 (two) times daily as needed for itching.    . primidone (MYSOLINE) 50 MG tablet Place 1 tablet (50 mg total) into feeding tube 2 (two) times daily.    . propranolol (INDERAL) 20 MG/5ML solution Place 5 mLs (20 mg total) into feeding tube 3 (three) times daily. 500 mL 12  . rosuvastatin (CRESTOR) 20 MG tablet Place 1 tablet (20 mg total) into feeding tube daily.    . Water For Irrigation, Sterile (FREE WATER) SOLN Place 300 mLs into feeding tube every 8 (eight) hours.     No current facility-administered medications on file prior to visit.     Allergies:  Allergies  Allergen Reactions  . Pneumococcal Vaccines Anaphylaxis    Review of Systems:  CONSTITUTIONAL: No fevers, chills, night sweats, or weight loss.  EYES: No visual changes or eye pain ENT: No hearing changes.  No history of nose bleeds.   RESPIRATORY: No cough, wheezing and shortness of breath.   CARDIOVASCULAR: Negative for chest pain, and palpitations.   GI: Negative for abdominal discomfort, blood in stools or black stools.  No recent change in bowel habits.   GU:  No history of incontinence.   MUSCLOSKELETAL: +history of joint pain or swelling.  No myalgias.   SKIN: Negative for lesions, rash, and itching.   ENDOCRINE: Negative for cold or heat intolerance, polydipsia or goiter.   PSYCH:  No depression or anxiety symptoms.   NEURO: As Above.   Vital Signs:  There were no vitals taken for this visit.  General Medical Exam:   General:  Well appearing, comfortable  Eyes/ENT: see cranial nerve  examination.   Neck:   No carotid bruits. Respiratory:  Clear to auscultation, good air entry bilaterally.   Cardiac:  Regular rate and rhythm, no murmur.   Ext:  No edema ***  Neurological Exam: MENTAL STATUS including orientation to time, place, person, recent and remote memory, attention span and concentration, language, and fund of knowledge is fairly intact.  Speech is not dysarthric, tremulous pattern.  CRANIAL NERVES:  Pupils are round and reactive. Extraocular muscle are intact.  Mild bilateral ptosis (old) Face is symmetric.   MOTOR:  Motor strength is 5/5 in all extremities.  There is a mild hand tremor which is worse with finger to nose testing.  No pronator drift.  Tone is normal.    COORDINATION/GAIT:  Normal finger-to- nose-finger.  Gait is wide-based, slow.   Data: EMG 12/01/2014: 1. 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 Overall, these findings are moderately severe in degree electrically.  2. 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 There is no evidence of a cervical/lumbosacral radiculopathy affecting the right side  Lab Results  Component Value Date   HGBA1C 9.5 (H) 06/19/2018   Lab Results  Component Value Date   TSH 1.362 06/06/2017   Labs 04/08/2014:  Copper 64*, ceruloplasmin 18, vitamin B12 290, SPEP/UPEP with IFE no M protein   CT head 10/21/2014 and 01/15/2015:  Negative  CT head wo contrast 04/21/2016:  Negative  IMPRESSION/PLAN: 1.  Essential tremor involving the hands and voice, improved on primodone.    - Continue primidone to 50mg  twice daily  - Continue propranolol 40mg  TID.   - Continue gabapentin 300mg  twice daily  2.  Diabetic neuropathy affecting the hands and feet, uncontrolled diabetes (HbA1c 9.5) followed by Dr. Cruzita Lederer.  ***  Returnto clinic in ***   Thank  you for allowing me to participate in patient's care.  If I can answer any additional questions, I would be pleased to do so.    Sincerely,    Mehki Klumpp K. Posey Pronto, DO

## 2018-12-19 ENCOUNTER — Ambulatory Visit: Payer: Medicare Other | Admitting: Neurology

## 2019-03-24 IMAGING — DX DG CHEST 1V PORT
1 series · 1 of 1 positions shown · non-contrast
Comparison: 07/31/2018

CLINICAL DATA: Check endotracheal tube placement

EXAM:
PORTABLE CHEST 1 VIEW

[chest]
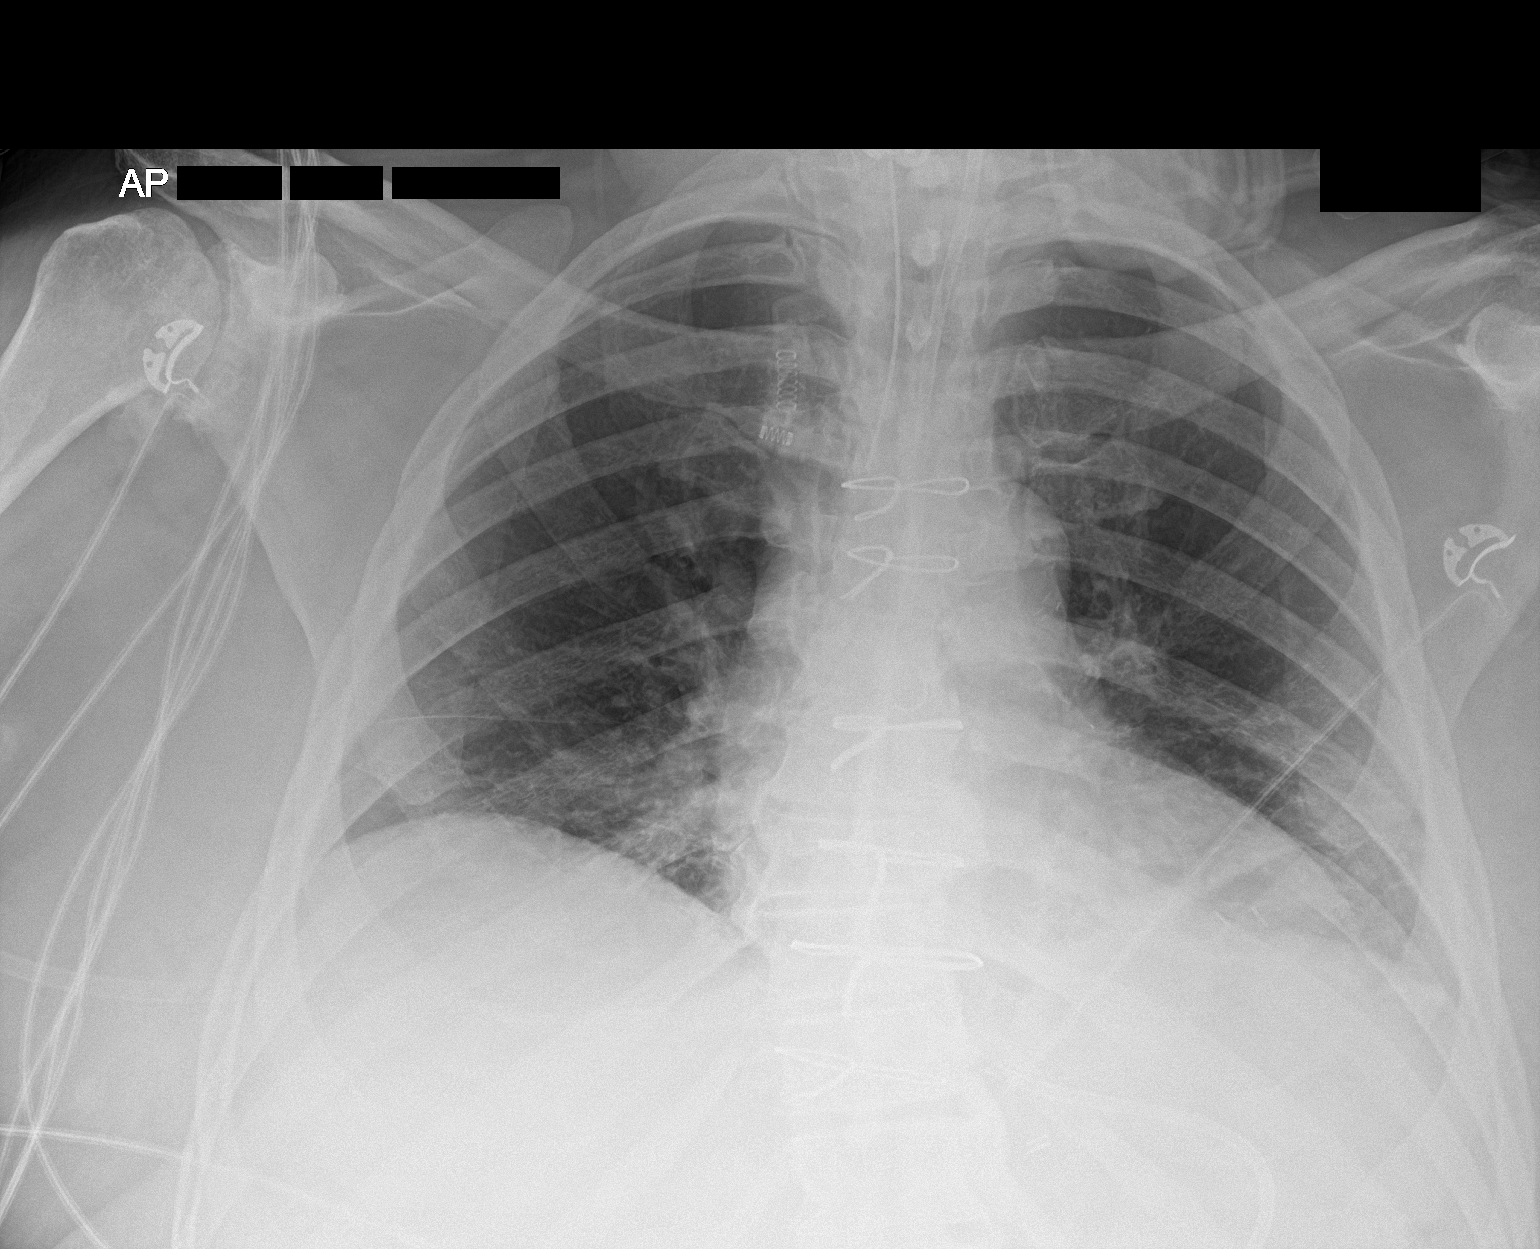

[1 of 1 positions shown; findings below may reference images not displayed]

FINDINGS: Endotracheal tube and feeding catheter are noted and in stable
position. Bibasilar atelectasis is again seen but slightly improved.
Cardiac shadow is stable. Postsurgical changes are again noted. No
sizable effusion or pneumothorax is noted.
IMPRESSION: Stable bibasilar atelectasis.

Tubes and lines as described.

## 2019-03-25 IMAGING — DX DG CHEST 1V PORT
1 series · 1 of 1 positions shown · non-contrast
Comparison: 08/01/2018

CLINICAL DATA: New onset chest pain.

EXAM:
PORTABLE CHEST 1 VIEW

[chest]
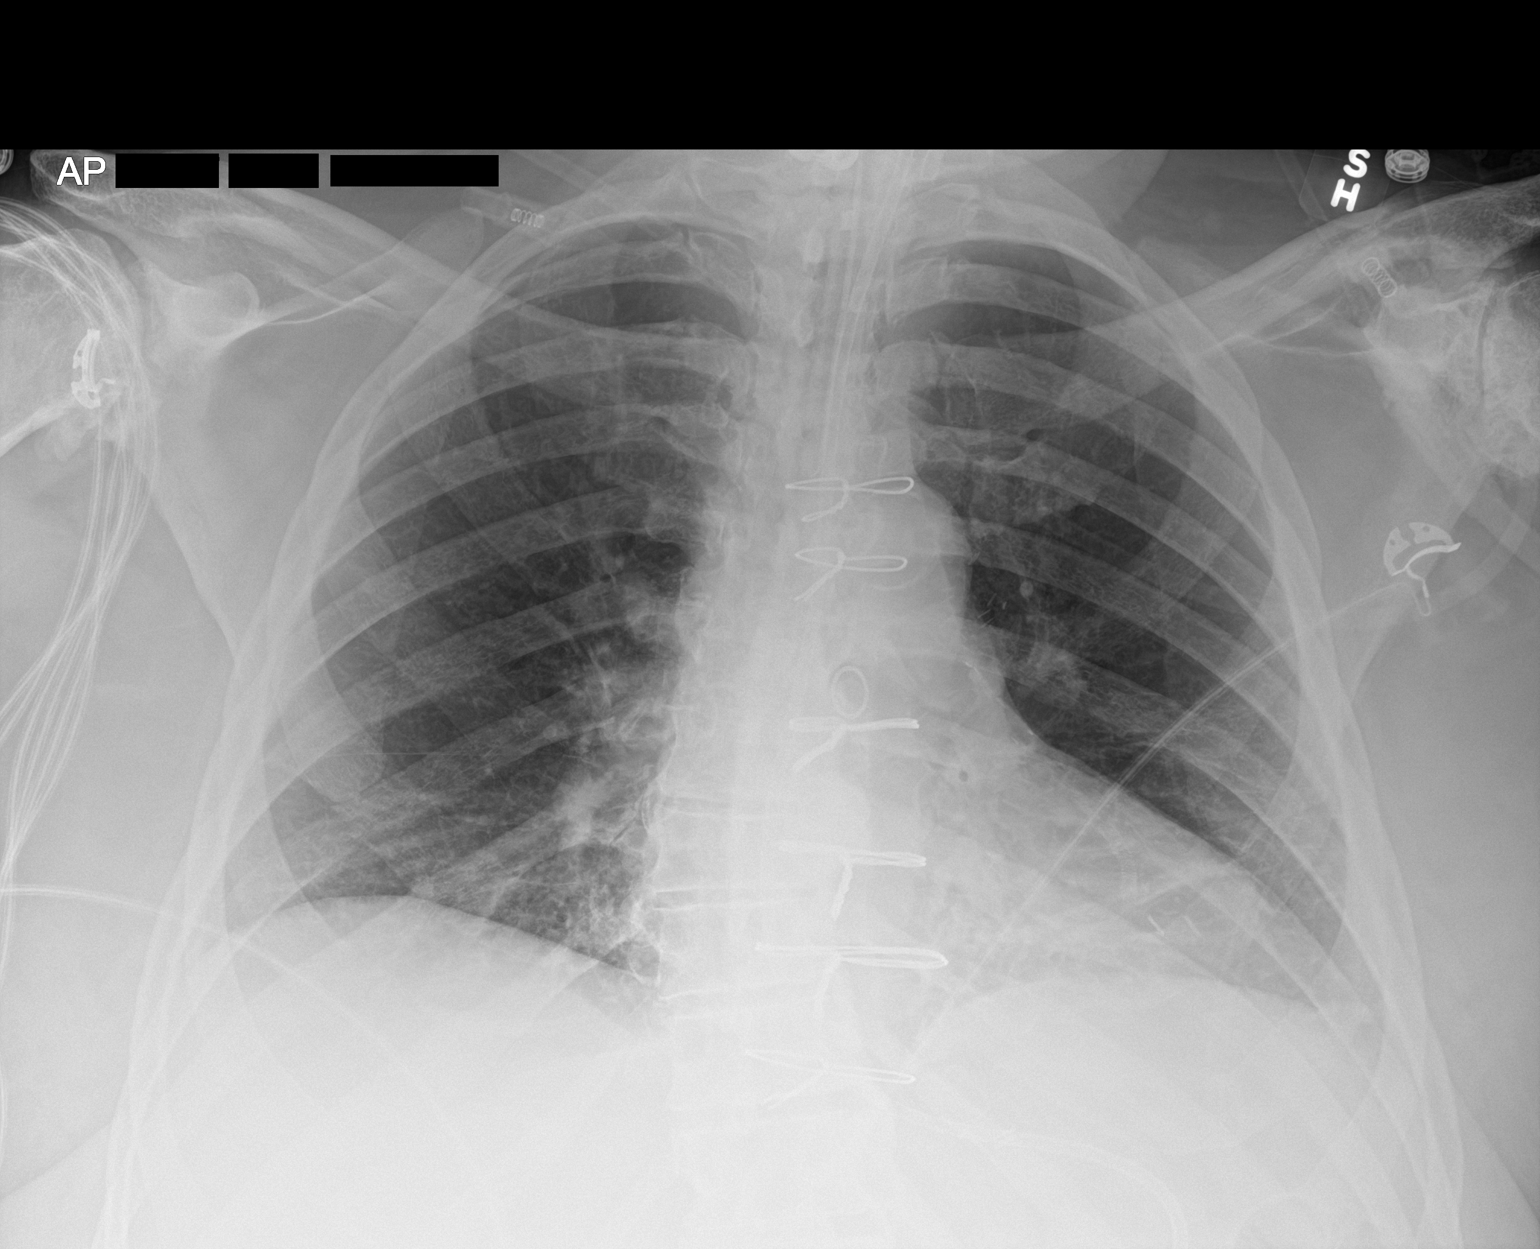

[1 of 1 positions shown; findings below may reference images not displayed]

FINDINGS: The ET tube tip is above the carina. Enteric tube tip is below the
field of view. Heart size normal. Bibasilar atelectasis identified,
unchanged. No airspace opacities, pulmonary edema or pleural
effusion noted.
IMPRESSION: 1. Persistent bibasilar atelectasis.
